# Patient Record
Sex: Male | Born: 1940 | Race: White | Hispanic: No | Smoking: Former smoker
Health system: Southern US, Community
[De-identification: ages and names within clinical notes are randomized; demographics above are authoritative.]

## PROBLEM LIST (undated history)

## (undated) DIAGNOSIS — R609 Edema, unspecified: Secondary | ICD-10-CM

## (undated) DIAGNOSIS — E119 Type 2 diabetes mellitus without complications: Secondary | ICD-10-CM

## (undated) DIAGNOSIS — I739 Peripheral vascular disease, unspecified: Secondary | ICD-10-CM

## (undated) DIAGNOSIS — Z95828 Presence of other vascular implants and grafts: Secondary | ICD-10-CM

## (undated) DIAGNOSIS — I509 Heart failure, unspecified: Secondary | ICD-10-CM

## (undated) DIAGNOSIS — I272 Pulmonary hypertension, unspecified: Secondary | ICD-10-CM

## (undated) DIAGNOSIS — I701 Atherosclerosis of renal artery: Secondary | ICD-10-CM

## (undated) DIAGNOSIS — I714 Abdominal aortic aneurysm, without rupture, unspecified: Secondary | ICD-10-CM

## (undated) DIAGNOSIS — I2510 Atherosclerotic heart disease of native coronary artery without angina pectoris: Secondary | ICD-10-CM

## (undated) DIAGNOSIS — W19XXXA Unspecified fall, initial encounter: Secondary | ICD-10-CM

## (undated) DIAGNOSIS — M542 Cervicalgia: Principal | ICD-10-CM

## (undated) DIAGNOSIS — R9430 Abnormal result of cardiovascular function study, unspecified: Secondary | ICD-10-CM

## (undated) DIAGNOSIS — M1990 Unspecified osteoarthritis, unspecified site: Secondary | ICD-10-CM

## (undated) DIAGNOSIS — I4891 Unspecified atrial fibrillation: Secondary | ICD-10-CM

## (undated) DIAGNOSIS — R011 Cardiac murmur, unspecified: Secondary | ICD-10-CM

## (undated) DIAGNOSIS — I10 Essential (primary) hypertension: Secondary | ICD-10-CM

## (undated) DIAGNOSIS — I779 Disorder of arteries and arterioles, unspecified: Secondary | ICD-10-CM

## (undated) DIAGNOSIS — K219 Gastro-esophageal reflux disease without esophagitis: Secondary | ICD-10-CM

## (undated) DIAGNOSIS — C801 Malignant (primary) neoplasm, unspecified: Secondary | ICD-10-CM

## (undated) DIAGNOSIS — J189 Pneumonia, unspecified organism: Secondary | ICD-10-CM

## (undated) DIAGNOSIS — R001 Bradycardia, unspecified: Secondary | ICD-10-CM

## (undated) DIAGNOSIS — E785 Hyperlipidemia, unspecified: Secondary | ICD-10-CM

## (undated) DIAGNOSIS — Z9981 Dependence on supplemental oxygen: Secondary | ICD-10-CM

## (undated) DIAGNOSIS — R42 Dizziness and giddiness: Secondary | ICD-10-CM

## (undated) DIAGNOSIS — I219 Acute myocardial infarction, unspecified: Secondary | ICD-10-CM

## (undated) DIAGNOSIS — Z951 Presence of aortocoronary bypass graft: Secondary | ICD-10-CM

## (undated) HISTORY — DX: Pneumonia, unspecified organism: J18.9

## (undated) HISTORY — DX: Hyperlipidemia, unspecified: E78.5

## (undated) HISTORY — DX: Atherosclerotic heart disease of native coronary artery without angina pectoris: I25.10

## (undated) HISTORY — PX: WRIST SURGERY: SHX841

## (undated) HISTORY — DX: Malignant (primary) neoplasm, unspecified: C80.1

## (undated) HISTORY — DX: Abdominal aortic aneurysm, without rupture, unspecified: I71.40

## (undated) HISTORY — PX: VASCULAR SURGERY: SHX849

## (undated) HISTORY — DX: Presence of other vascular implants and grafts: Z95.828

## (undated) HISTORY — DX: Dizziness and giddiness: R42

## (undated) HISTORY — DX: Peripheral vascular disease, unspecified: I73.9

## (undated) HISTORY — DX: Acute myocardial infarction, unspecified: I21.9

## (undated) HISTORY — DX: Essential (primary) hypertension: I10

## (undated) HISTORY — DX: Edema, unspecified: R60.9

## (undated) HISTORY — DX: Disorder of arteries and arterioles, unspecified: I77.9

## (undated) HISTORY — DX: Abnormal result of cardiovascular function study, unspecified: R94.30

## (undated) HISTORY — DX: Type 2 diabetes mellitus without complications: E11.9

## (undated) HISTORY — DX: Presence of aortocoronary bypass graft: Z95.1

## (undated) HISTORY — DX: Atherosclerosis of renal artery: I70.1

## (undated) HISTORY — DX: Bradycardia, unspecified: R00.1

## (undated) HISTORY — DX: Abdominal aortic aneurysm, without rupture: I71.4

## (undated) HISTORY — DX: Pulmonary hypertension, unspecified: I27.20

## (undated) HISTORY — DX: Cervicalgia: M54.2

## (undated) HISTORY — DX: Unspecified osteoarthritis, unspecified site: M19.90

---

## 1997-04-02 DIAGNOSIS — I219 Acute myocardial infarction, unspecified: Secondary | ICD-10-CM

## 1997-04-02 HISTORY — DX: Acute myocardial infarction, unspecified: I21.9

## 1998-04-02 DIAGNOSIS — Z951 Presence of aortocoronary bypass graft: Secondary | ICD-10-CM

## 1998-04-02 HISTORY — DX: Presence of aortocoronary bypass graft: Z95.1

## 1998-04-02 HISTORY — PX: CORONARY ARTERY BYPASS GRAFT: SHX141

## 1998-11-01 ENCOUNTER — Inpatient Hospital Stay (HOSPITAL_COMMUNITY): Admission: AD | Admit: 1998-11-01 | Discharge: 1998-11-09 | Payer: Self-pay | Admitting: Cardiology

## 1998-11-04 ENCOUNTER — Encounter: Payer: Self-pay | Admitting: Thoracic Surgery (Cardiothoracic Vascular Surgery)

## 1998-11-05 ENCOUNTER — Encounter: Payer: Self-pay | Admitting: Thoracic Surgery (Cardiothoracic Vascular Surgery)

## 1999-01-12 ENCOUNTER — Ambulatory Visit: Admission: RE | Admit: 1999-01-12 | Discharge: 1999-01-12 | Payer: Self-pay | Admitting: Vascular Surgery

## 1999-02-07 ENCOUNTER — Encounter: Payer: Self-pay | Admitting: Vascular Surgery

## 1999-02-08 ENCOUNTER — Encounter: Payer: Self-pay | Admitting: Vascular Surgery

## 1999-02-08 ENCOUNTER — Inpatient Hospital Stay: Admission: RE | Admit: 1999-02-08 | Discharge: 1999-02-13 | Payer: Self-pay | Admitting: Vascular Surgery

## 1999-05-03 ENCOUNTER — Inpatient Hospital Stay: Admission: RE | Admit: 1999-05-03 | Discharge: 1999-05-06 | Payer: Self-pay | Admitting: Vascular Surgery

## 1999-05-04 ENCOUNTER — Encounter: Payer: Self-pay | Admitting: Vascular Surgery

## 1999-07-19 ENCOUNTER — Inpatient Hospital Stay (HOSPITAL_COMMUNITY): Admission: RE | Admit: 1999-07-19 | Discharge: 1999-07-25 | Payer: Self-pay | Admitting: Vascular Surgery

## 1999-07-19 ENCOUNTER — Encounter: Payer: Self-pay | Admitting: Vascular Surgery

## 1999-07-19 HISTORY — PX: PR VEIN BYPASS GRAFT,AORTO-FEM-POP: 35551

## 2002-11-30 ENCOUNTER — Ambulatory Visit (HOSPITAL_COMMUNITY): Admission: RE | Admit: 2002-11-30 | Discharge: 2002-11-30 | Payer: Self-pay | Admitting: Urology

## 2002-11-30 ENCOUNTER — Encounter: Payer: Self-pay | Admitting: Urology

## 2002-12-03 ENCOUNTER — Ambulatory Visit: Admission: RE | Admit: 2002-12-03 | Discharge: 2003-03-03 | Payer: Self-pay | Admitting: Radiation Oncology

## 2003-03-04 ENCOUNTER — Ambulatory Visit: Admission: RE | Admit: 2003-03-04 | Discharge: 2003-05-13 | Payer: Self-pay

## 2003-03-08 ENCOUNTER — Encounter: Admission: RE | Admit: 2003-03-08 | Discharge: 2003-03-08 | Payer: Self-pay | Admitting: Urology

## 2003-04-06 ENCOUNTER — Ambulatory Visit (HOSPITAL_COMMUNITY): Admission: RE | Admit: 2003-04-06 | Discharge: 2003-04-06 | Payer: Self-pay | Admitting: Urology

## 2003-04-06 ENCOUNTER — Ambulatory Visit (HOSPITAL_BASED_OUTPATIENT_CLINIC_OR_DEPARTMENT_OTHER): Admission: RE | Admit: 2003-04-06 | Discharge: 2003-04-06 | Payer: Self-pay | Admitting: Urology

## 2004-05-02 ENCOUNTER — Ambulatory Visit: Payer: Self-pay | Admitting: Cardiology

## 2005-04-30 ENCOUNTER — Ambulatory Visit: Payer: Self-pay | Admitting: Cardiology

## 2005-08-24 ENCOUNTER — Ambulatory Visit (HOSPITAL_COMMUNITY): Admission: RE | Admit: 2005-08-24 | Discharge: 2005-08-24 | Payer: Self-pay | Admitting: Family Medicine

## 2005-10-18 ENCOUNTER — Ambulatory Visit (HOSPITAL_COMMUNITY): Admission: RE | Admit: 2005-10-18 | Discharge: 2005-10-18 | Payer: Self-pay | Admitting: Family Medicine

## 2006-04-08 ENCOUNTER — Ambulatory Visit: Payer: Self-pay | Admitting: Cardiology

## 2006-04-30 ENCOUNTER — Ambulatory Visit: Payer: Self-pay

## 2006-05-02 HISTORY — PX: PR VEIN BYPASS GRAFT,AORTO-FEM-POP: 35551

## 2006-07-15 ENCOUNTER — Ambulatory Visit: Payer: Self-pay | Admitting: Vascular Surgery

## 2006-10-10 ENCOUNTER — Ambulatory Visit: Payer: Self-pay | Admitting: Cardiology

## 2006-10-10 LAB — CONVERTED CEMR LAB
ALT: 20 units/L (ref 0–53)
AST: 26 units/L (ref 0–37)
Albumin: 3.7 g/dL (ref 3.5–5.2)
Alkaline Phosphatase: 66 units/L (ref 39–117)
Bilirubin, Direct: 0.1 mg/dL (ref 0.0–0.3)
LDL Cholesterol: 68 mg/dL (ref 0–99)
VLDL: 37 mg/dL (ref 0–40)

## 2007-03-06 ENCOUNTER — Ambulatory Visit: Payer: Self-pay | Admitting: Cardiology

## 2007-03-06 LAB — CONVERTED CEMR LAB
Albumin: 3.4 g/dL — ABNORMAL LOW (ref 3.5–5.2)
LDL Cholesterol: 78 mg/dL (ref 0–99)
Total Bilirubin: 0.7 mg/dL (ref 0.3–1.2)
Total CHOL/HDL Ratio: 7.4
Triglycerides: 185 mg/dL — ABNORMAL HIGH (ref 0–149)

## 2007-03-07 ENCOUNTER — Ambulatory Visit: Payer: Self-pay | Admitting: Vascular Surgery

## 2007-07-07 ENCOUNTER — Ambulatory Visit: Payer: Self-pay | Admitting: Cardiology

## 2007-09-09 ENCOUNTER — Ambulatory Visit: Payer: Self-pay | Admitting: Cardiology

## 2007-09-09 LAB — CONVERTED CEMR LAB
AST: 31 units/L (ref 0–37)
Albumin: 3.7 g/dL (ref 3.5–5.2)
Alkaline Phosphatase: 55 units/L (ref 39–117)
Cholesterol: 115 mg/dL (ref 0–200)
HDL: 19.9 mg/dL — ABNORMAL LOW (ref >39.0)
LDL Cholesterol: 65 mg/dL (ref 0–99)
Total Protein: 6.7 g/dL (ref 6.0–8.3)
Triglycerides: 149 mg/dL (ref 0–149)

## 2008-01-05 ENCOUNTER — Ambulatory Visit: Payer: Self-pay | Admitting: Cardiology

## 2008-03-23 ENCOUNTER — Ambulatory Visit: Payer: Self-pay | Admitting: Vascular Surgery

## 2008-07-04 DIAGNOSIS — R609 Edema, unspecified: Secondary | ICD-10-CM | POA: Insufficient documentation

## 2008-07-04 DIAGNOSIS — Z951 Presence of aortocoronary bypass graft: Secondary | ICD-10-CM | POA: Insufficient documentation

## 2008-07-05 ENCOUNTER — Encounter: Payer: Self-pay | Admitting: Cardiology

## 2008-07-05 ENCOUNTER — Ambulatory Visit: Payer: Self-pay | Admitting: Cardiology

## 2008-10-14 ENCOUNTER — Ambulatory Visit (HOSPITAL_COMMUNITY): Admission: RE | Admit: 2008-10-14 | Discharge: 2008-10-14 | Payer: Self-pay | Admitting: Family Medicine

## 2009-03-03 ENCOUNTER — Encounter: Payer: Self-pay | Admitting: Cardiology

## 2009-03-11 ENCOUNTER — Telehealth (INDEPENDENT_AMBULATORY_CARE_PROVIDER_SITE_OTHER): Payer: Self-pay | Admitting: *Deleted

## 2009-03-23 ENCOUNTER — Encounter: Payer: Self-pay | Admitting: Cardiology

## 2009-04-15 ENCOUNTER — Ambulatory Visit: Payer: Self-pay | Admitting: Vascular Surgery

## 2009-04-18 ENCOUNTER — Ambulatory Visit (HOSPITAL_COMMUNITY): Admission: RE | Admit: 2009-04-18 | Discharge: 2009-04-18 | Payer: Self-pay | Admitting: Family Medicine

## 2009-05-25 ENCOUNTER — Encounter (INDEPENDENT_AMBULATORY_CARE_PROVIDER_SITE_OTHER): Payer: Self-pay | Admitting: *Deleted

## 2009-07-12 ENCOUNTER — Encounter: Payer: Self-pay | Admitting: Cardiology

## 2009-07-13 ENCOUNTER — Ambulatory Visit: Payer: Self-pay | Admitting: Cardiology

## 2009-11-09 ENCOUNTER — Encounter: Payer: Self-pay | Admitting: Internal Medicine

## 2009-11-30 ENCOUNTER — Ambulatory Visit (HOSPITAL_COMMUNITY): Admission: RE | Admit: 2009-11-30 | Discharge: 2009-11-30 | Payer: Self-pay | Admitting: Internal Medicine

## 2009-11-30 ENCOUNTER — Ambulatory Visit: Payer: Self-pay | Admitting: Internal Medicine

## 2009-11-30 HISTORY — PX: COLONOSCOPY: SHX174

## 2010-01-04 ENCOUNTER — Ambulatory Visit: Payer: Self-pay | Admitting: Cardiology

## 2010-01-04 DIAGNOSIS — E785 Hyperlipidemia, unspecified: Secondary | ICD-10-CM | POA: Insufficient documentation

## 2010-05-04 NOTE — Letter (Signed)
Summary: Internal Other Domingo Dimes  Internal Other Domingo Dimes   Imported By: Cloria Spring LPN 16/01/9603 54:09:81  _____________________________________________________________________  External Attachment:    Type:   Image     Comment:   External Document

## 2010-05-04 NOTE — Letter (Signed)
Summary: Appointment - Reminder 2  [employer], Main Office  [address]   [place], [place] [address]   Phone: [phone]  Fax: [phone]     May 25, 2009 MRN: [id number]   Troy [name] [address] [place], [place]  [address]   Dear [name],  Our records indicate that it is time to schedule a follow-up appointment with Dr. Myrtis Ser. It is very important that we reach you to schedule this appointment. We look forward to participating in your health care needs. Please contact us at the number listed above at your earliest convenience to schedule your appointment.  If you are unable to make an appointment at this time, give [place] a call so we can update our records.     Sincerely,   Migdalia Dk [facility] Scheduling Team

## 2010-05-04 NOTE — Letter (Signed)
Summary: [employer] - Disability  [employer] - Disability   Imported By: Marylou Mccoy 05/18/2009 16:23:17  _____________________________________________________________________  External Attachment:    Type:   Image     Comment:   External Document

## 2010-05-04 NOTE — Assessment & Plan Note (Signed)
Summary: f45m   Visit Type:  Follow-up Primary Dracen Reigle:  Phillips Odor  CC:  CAD.  History of Present Illness: The patient is seen for followup of coronary artery disease.  He is stable.  He's not having any chest pain.  He has no significant shortness of breath.  Current Medications (verified): 1)  Aspirin 325 Mg Tabs (Aspirin) .... Take 1 Tab By Mouth Occasionally 2)  Coumadin 2 Mg Tabs (Warfarin Sodium) .... Uad 3)  Furosemide 40 Mg Tabs (Furosemide) .... Take One Tablet By Mouth Daily. 4)  Simvastatin 80 Mg Tabs (Simvastatin) .... 1/2 At Bedtime 5)  Cozaar 100 Mg Tabs (Losartan Potassium) .[name].. 1 Once Daily 6)  Metoprolol Tartrate 50 Mg Tabs (Metoprolol Tartrate) .... 1/2 Tab By Mouth Twice A Day 7)  Isosorbide Dinitrate 20 Mg Tabs (Isosorbide Dinitrate) .... Take One Capsule By Mouth Two Times A Day 8)  Amlodipine Besylate 10 Mg Tabs (Amlodipine Besylate) .... Take One Tablet By Mouth Daily 9)  Metformin Hcl 1000 Mg Tabs (Metformin Hcl) .... Two Times A Day 10)  Tylenol .... Prn 11)  Fish Oil   Oil (Fish Oil) .... Once Daily 12)  Cozaar 100 Mg Tabs (Losartan Potassium) .[name].. 1 Once Daily 13)  Loratadine 10 Mg Tabs (Loratadine) .... As Needed 14)  Hydrocodone-Acetaminophen 5-500 Mg Tabs (Hydrocodone-Acetaminophen) .... As Needed  Allergies (verified): 1)  ! Potassium Chloride Cr (Potassium Chloride) 2)  ! 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 /   CABG  /   nuclear  04/2006...normal Fem-Pop  surgery   Dr. Arbie Cookey LV   normal nuclear   (no echo data as of 07/12/2009) Sinus bradycardia Carotid arteries.... Dopplers [facility] by Dr. Nobie Putnam Dyslipidemia  Review of Systems       Patient denies fever,  chills, headache, sweats, rash, change in vision, change in hearing, chest pain, cough, nausea vomiting, urinary symptoms.  All other systems are reviewed and are negative.  Vital Signs:  Patient profile:   70 year old male Height:      71 inches Weight:      243 pounds BMI:     34.01 Pulse rate:   60 / minute BP sitting:   112 / 58  (left arm) Cuff size:   regular  Vitals Entered By: Hardin Negus, RMA (January 04, 2010 12:14 PM)  Physical Exam  General:  patient is stable but overweight. Eyes:  no xanthelasma. Neck:  no jugular venous distention. Lungs:  lungs are clear.  Respiratory effort is nonlabored. Heart:  cardiac exam reveals S1-S2.  No clicks or significant murmurs. Abdomen:  abdomen is soft. Extremities:  no peripheral edema. Psych:  patient is oriented to person time and place.  affect is normal.   Impression & Recommendations:  Problem # 1:  SINUS BRADYCARDIA (ICD-427.81) Heart rate today is 52.  He is stable with this.  No change.  Problem # 2:  ESSENTIAL HYPERTENSION, BENIGN (ICD-401.1)  His updated medication list for this problem includes:    Aspirin 325 Mg Tabs (Aspirin) .[name]... Take 1 tab by mouth occasionally    Furosemide 40 Mg Tabs (Furosemide) .[name]... Take one tablet by mouth daily.    Cozaar 100 Mg Tabs (Losartan potassium) .[name]... 1 once daily    Metoprolol Tartrate  50 Mg Tabs (Metoprolol tartrate) .[name]... 1/2 tab by mouth twice a day    Amlodipine Besylate 10 Mg Tabs (Amlodipine besylate) .[name]... Take one tablet by mouth daily Blood pressure control.  No change in therapy.  Problem # 3:  CHEST PAIN, PRECORDIAL (ICD-786.51)  The following medications were removed from the medication list:    Nitroglycerin 0.4 Mg Subl (Nitroglycerin) .[name]... Take 1 tab sl as needed chest pain His updated medication list for this problem includes:    Aspirin 325 Mg Tabs (Aspirin) .[name]... Take 1 tab by mouth occasionally    Coumadin 2 Mg Tabs (Warfarin sodium) ..... Uad     Metoprolol Tartrate 50 Mg Tabs (Metoprolol tartrate) .[name]... 1/2 tab by mouth twice a day    Isosorbide Dinitrate 20 Mg Tabs (Isosorbide dinitrate) .[name]... Take one capsule by mouth two times a day    Amlodipine Besylate 10 Mg Tabs (Amlodipine besylate) .[name]... Take one tablet by mouth daily Coronary disease is stable.  No change in therapy.  Problem # 4:  DYSLIPIDEMIA (ICD-272.4)  His updated medication list for this problem includes:    Simvastatin 80 Mg Tabs (Simvastatin) .[name]... 1/2 at bedtime The patient has been on 80 mg of simvastatin.  Current recommendations made need to decrease his dose to 20 mg daily.  If over time he needs more aggressive treatment, we can consider Lipitor when it becomes generic  Patient Instructions: 1)  Decrease Simvastatin to 1/2 tab daily, when you are about to run out of pills give me a call because we then want to decrease to 20mg . 2)  Your physician wants you to follow-up in:  6 months.  You will receive a reminder letter in the mail two months in advance. If you don't receive a letter, please call our office to schedule the follow-up appointment.

## 2010-05-04 NOTE — Miscellaneous (Signed)
  Clinical Lists Changes  Observations: Added new observation of PRIMARY MD: Patrica Duel, MD (07/12/2009 7:40)

## 2010-05-04 NOTE — Assessment & Plan Note (Signed)
Summary: f1y   Visit Type:  Follow-up Primary Provider:  Patrica Duel, MD  CC:  CAD.  History of Present Illness: The patient is seen for follow up of coronary disease.  He has chronic left anterior chest pain that is not ischemic.  He had a followup nuclear study showing no ischemia.  He has not had any unusual chest pain syncope or presyncope.  Current Medications (verified): 1)  Aspirin 325 Mg Tabs (Aspirin) .... Take 1 Tab By Mouth Every Day 2)  Coumadin 2 Mg Tabs (Warfarin Sodium) .... Uad 3)  Furosemide 40 Mg Tabs (Furosemide) .... Take One Tablet By Mouth Daily. 4)  Simvastatin 80 Mg Tabs (Simvastatin) .... 1/2 At Bedtime 5)  Cozaar 100 Mg Tabs (Losartan Potassium) .[name].. 1 Once Daily 6)  Metoprolol Tartrate 50 Mg Tabs (Metoprolol Tartrate) .... 1/2 Tab By Mouth Twice A Day 7)  Isosorbide Dinitrate 20 Mg Tabs (Isosorbide Dinitrate) .... Take One Capsule By Mouth Two Times A Day 8)  Amlodipine Besylate 10 Mg Tabs (Amlodipine Besylate) .... Take One Tablet By Mouth Daily 9)  Metformin Hcl 500 Mg Tabs (Metformin Hcl) .[name].. 1 Once Daily 10)  Nitroglycerin 0.4 Mg Subl (Nitroglycerin) .... Take 1 Tab Sl As Needed Chest Pain 11)  Tylenol .... Prn 12)  Fish Oil   Oil (Fish Oil) .... Once Daily 13)  Cozaar 100 Mg Tabs (Losartan Potassium) .[name].. 1 Once Daily  Allergies (verified): 1)  ! Potassium Chloride Cr (Potassium Chloride) 2)  ! 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 /   CABG  /   nuclear  04/2006...normal Fem-Pop  surgery   Dr. Arbie Cookey LV   normal nuclear   (no echo data as of 07/12/2009) Sinus bradycardia Carotid arteries.... Dopplers done at [facility] by Dr. Nobie Putnam  Review of Systems       Patient denies  fever, chills, headache, sweats, rash, change in vision, change in hearing, cough, nausea vomiting, urinary symptoms.  All other systems are reviewed and are negative  Vital Signs:  Patient profile:   70 year old male Height:      71 inches Weight:      247 pounds BMI:     34.57 Pulse rate:   50 / minute BP sitting:   122 / 68  (left arm) Cuff size:   regular  Vitals Entered By: Hardin Negus, RMA (July 13, 2009 10:57 AM)  Physical Exam  General:  patient is stable. Eyes:  no xanthelasma. Neck:  no jugular venous distention. Lungs:  lungs are clear.  Respiratory effort is nonlabored. Heart:  cardiac exam reveals S1-S2.  No clicks or significant murmurs. Abdomen:  abdomen is soft. Extremities:  no peripheral edema. Psych:  patient is oriented to person time and place.  Affect is normal.   Impression & Recommendations:  Problem # 1:  SINUS BRADYCARDIA (ICD-427.81)  His updated medication list for this problem includes:    Aspirin 325 Mg Tabs (Aspirin) .[name]... Take 1 tab by mouth every day    Coumadin 2 Mg Tabs (Warfarin sodium) ..... Uad    Metoprolol Tartrate 50 Mg Tabs (Metoprolol tartrate) .[name]... 1/2 tab by mouth twice a day    Isosorbide Dinitrate 20 Mg Tabs (Isosorbide dinitrate) .[name]... Take one capsule by mouth two times a  day    Amlodipine Besylate 10 Mg Tabs (Amlodipine besylate) .[name]... Take one tablet by mouth daily    Nitroglycerin 0.4 Mg Subl (Nitroglycerin) .[name]... Take 1 tab sl as needed chest pain EKG is done today and reviewed by me.  He has chronic sinus bradycardia.  He has incomplete right bundle branch block.  No change in therapy.  Problem # 2:  ESSENTIAL HYPERTENSION, BENIGN (ICD-401.1)  His updated medication list for this problem includes:    Aspirin 325 Mg Tabs (Aspirin) .[name]... Take 1 tab by mouth every day    Furosemide 40 Mg Tabs (Furosemide) .[name]... Take one tablet by mouth daily.    Cozaar 100 Mg Tabs (Losartan potassium) .[name]... 1 once daily    Metoprolol  Tartrate 50 Mg Tabs (Metoprolol tartrate) .[name]... 1/2 tab by mouth twice a day    Amlodipine Besylate 10 Mg Tabs (Amlodipine besylate) .[name]... Take one tablet by mouth daily Blood pressure is controlled.  No change in therapy.  Problem # 3:  CHEST PAIN, PRECORDIAL (ICD-786.51)  His updated medication list for this problem includes:    Aspirin 325 Mg Tabs (Aspirin) .[name]... Take 1 tab by mouth every day    Coumadin 2 Mg Tabs (Warfarin sodium) ..... Uad    Metoprolol Tartrate 50 Mg Tabs (Metoprolol tartrate) .[name]... 1/2 tab by mouth twice a day    Isosorbide Dinitrate 20 Mg Tabs (Isosorbide dinitrate) .[name]... Take one capsule by mouth two times a day    Amlodipine Besylate 10 Mg Tabs (Amlodipine besylate) .[name]... Take one tablet by mouth daily    Nitroglycerin 0.4 Mg Subl (Nitroglycerin) .[name]... Take 1 tab sl as needed chest pain The patient's coronary status is stable.  His chest pain is his chronic musculoskeletal pain.  No change in therapy.  Other Orders: EKG w/ Interpretation (93000)  Patient Instructions: 1)  Follow up in 6 months

## 2010-05-04 NOTE — Miscellaneous (Signed)
  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 /   CABG  /   nuclear  04/2006...normal Fem-Pop  surgery   Dr. 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 /   CABG  /   nuclear  04/2006...normal Fem-Pop  surgery   Dr. Arbie Cookey LV   normal nuclear   (no echo data as of 07/12/2009) Sinus bradycardia

## 2010-06-16 LAB — GLUCOSE, CAPILLARY: Glucose-Capillary: 149 mg/dL — ABNORMAL HIGH (ref 70–99)

## 2010-07-28 ENCOUNTER — Encounter: Payer: Self-pay | Admitting: *Deleted

## 2010-07-31 ENCOUNTER — Ambulatory Visit (INDEPENDENT_AMBULATORY_CARE_PROVIDER_SITE_OTHER): Payer: Medicare Other | Admitting: Cardiology

## 2010-07-31 ENCOUNTER — Encounter: Payer: Self-pay | Admitting: Cardiology

## 2010-07-31 ENCOUNTER — Other Ambulatory Visit: Payer: Self-pay | Admitting: Cardiology

## 2010-07-31 DIAGNOSIS — E785 Hyperlipidemia, unspecified: Secondary | ICD-10-CM

## 2010-07-31 DIAGNOSIS — R001 Bradycardia, unspecified: Secondary | ICD-10-CM | POA: Insufficient documentation

## 2010-07-31 DIAGNOSIS — Z95828 Presence of other vascular implants and grafts: Secondary | ICD-10-CM | POA: Insufficient documentation

## 2010-07-31 DIAGNOSIS — R9430 Abnormal result of cardiovascular function study, unspecified: Secondary | ICD-10-CM | POA: Insufficient documentation

## 2010-07-31 DIAGNOSIS — I2510 Atherosclerotic heart disease of native coronary artery without angina pectoris: Secondary | ICD-10-CM

## 2010-07-31 DIAGNOSIS — I701 Atherosclerosis of renal artery: Secondary | ICD-10-CM | POA: Insufficient documentation

## 2010-07-31 DIAGNOSIS — I779 Disorder of arteries and arterioles, unspecified: Secondary | ICD-10-CM

## 2010-07-31 DIAGNOSIS — R609 Edema, unspecified: Secondary | ICD-10-CM

## 2010-07-31 DIAGNOSIS — I498 Other specified cardiac arrhythmias: Secondary | ICD-10-CM

## 2010-07-31 MED ORDER — SIMVASTATIN 20 MG PO TABS: 20.0000 mg | ORAL_TABLET | Freq: Every day | ORAL | Status: DC

## 2010-07-31 NOTE — Assessment & Plan Note (Signed)
Coronary disease is stable.  The mild left anterior chest discomfort that he has is not ischemic in origin.  He had a nuclear scan 2008 showing no ischemia.  No further workup at this time.

## 2010-07-31 NOTE — Assessment & Plan Note (Signed)
We need to lower his simvastatin dose to 20 mg daily to the FDA recommendations as he is on amlodipine.  This will be changed to 20 mg daily.  He will follow up with his primary physician.  I would suggest followup lipid studies.  If he needs more aggressive treatment of his LDL, he could be switched to generic Lipitor.

## 2010-07-31 NOTE — Assessment & Plan Note (Signed)
He has a resting sinus bradycardia.  There is no symptoms.  No change in medicine.

## 2010-07-31 NOTE — Progress Notes (Signed)
HPI Patient is seen for followup of coronary disease.  He is doing well.  He has a history of CABG in the past.  Also over the years he has had some left lateral chest discomfort.  I have always felt that this was not his ischemic pain.  He continues to have this mild symptom.  It is nonexertional.  He is active.  He is not having chest pain or shortness of breath.  In addition he's not having any significant claudication.  His vascular surgery to the legs in the past was successful. Allergies  Allergen Reactions  . Niacin     Current Outpatient Prescriptions  Medication Sig Dispense Refill  . amLODipine (NORVASC) 10 MG tablet Take 10 mg by mouth daily.        [name] aspirin 325 MG EC tablet Take 325 mg by mouth daily.        . furosemide (LASIX) 40 MG tablet Take 40 mg by mouth daily.        [name] HYDROcodone-acetaminophen (VICODIN) 5-500 MG per tablet Take 1 tablet by mouth every 6 (six) hours as needed.        . isosorbide dinitrate (ISORDIL) 20 MG tablet Take 20 mg by mouth 2 (two) times daily.        [name] loratadine (CLARITIN) 10 MG tablet Take 10 mg by mouth as needed.        [name] losartan (COZAAR) 100 MG tablet Take 100 mg by mouth daily.        . metFORMIN (GLUCOPHAGE) 1000 MG tablet Take 1,000 mg by mouth 2 (two) times daily with a meal.        . metoprolol (LOPRESSOR) 50 MG tablet Take by mouth. Take 1/2 tablet twice a day       . Omega-3 Fatty Acids (FISH OIL) 1000 MG CAPS Take 1 capsule by mouth daily.        . simvastatin (ZOCOR) 80 MG tablet Take by mouth. Take 1/2 tablet at bed time       . warfarin (COUMADIN) 2 MG tablet Take 2 mg by mouth daily. As directed by Coumadin clinic         History   Social History  . Marital Status: Married    Spouse Name: N/A    Number of Children: N/A  . Years of Education: N/A   Occupational History  . Not on file.   Social History Main Topics  . Smoking status: Former Smoker    Quit date: 04/02/1998  . Smokeless tobacco: Not on file  . Alcohol  Use: No  . Drug Use: No  . Sexually Active: Not on file   Other Topics Concern  . Not on file   Social History Narrative  . No narrative on file    Family History  Problem Relation Age of Onset  . Coronary artery disease      family hx of    Past Medical History  Diagnosis Date  . Edema   . Hypertension   . Renal artery stenosis     50-70%  . Chest pain     precordial. mild chronic .[name]... nonischemic  . Coronary artery disease     Nuclear, January, 2008, no ischemia  . Sinus bradycardia   . Dyslipidemia   . Hx of CABG     2000  . S/P femoropopliteal bypass surgery     Dr. Arbie Cookey  . Ejection fraction     Normal LV, nuclear, 2008, ( no  echo data as July 31, 2010)  . Sinus bradycardia     Asymptomatic  . Carotid artery disease     Followed at [facility].    Past Surgical History  Procedure Date  . Coronary artery bypass graft 2000    ROS  Patient denies fever, chills, headache, sweats, rash, change in vision, change in hearing, cough, nausea vomiting, urinary symptoms.  All of the systems are reviewed and are negative.  PHYSICAL EXAM Patient is oriented to person time and place.  Affect is normal.  He is here with his wife today.  Head is atraumatic.  There is no xanthelasma.  There is a soft carotid bruit.  Lungs are clear.  Respiratory effort is unlabored.  Cardiac exam reveals S1-S2.  There is a 2/6 systolic murmur.  The abdomen is soft.  There is no peripheral edema.  There is no musculoskeletal deformities.  There are no significant skin rashes. Filed Vitals:   07/31/10 0955  BP: 135/68  Pulse: 52  Resp: 18    EKG EKG is done today and reviewed by me.  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  There is sinus bradycardia.  No significant change.  ASSESSMENT & PLAN

## 2010-07-31 NOTE — Assessment & Plan Note (Signed)
Historically the patient's carotids have been followed by Dopplers at [facility].  He has not had one in a prolonged period of time.  We will arrange for carotid Doppler.  I plan to see him back for his followup in 6 months.

## 2010-07-31 NOTE — Patient Instructions (Signed)
Change Simvastatin to 20mg  when you run out of what you have now. Your physician has requested that you have a carotid duplex. This test is an ultrasound of the carotid arteries in your neck. It looks at blood flow through these arteries that supply the brain with blood. Allow one hour for this exam. There are no restrictions or special instructions.  CAN HAVE DONE AT [facility]. Your physician wants you to follow-up in: 6 months. You will receive a reminder letter in the mail two months in advance. If you don't receive a letter, please call our office to schedule the follow-up appointment.

## 2010-07-31 NOTE — Assessment & Plan Note (Signed)
He does not have any edema at this time.  No change in therapy. 

## 2010-08-03 ENCOUNTER — Ambulatory Visit (HOSPITAL_COMMUNITY)
Admission: RE | Admit: 2010-08-03 | Discharge: 2010-08-03 | Disposition: A | Discharge status: Home / Self Care | Payer: Medicare Other | Source: Ambulatory Visit | Attending: Cardiology | Admitting: Cardiology

## 2010-08-03 DIAGNOSIS — I6529 Occlusion and stenosis of unspecified carotid artery: Secondary | ICD-10-CM | POA: Insufficient documentation

## 2010-08-03 DIAGNOSIS — I10 Essential (primary) hypertension: Secondary | ICD-10-CM | POA: Insufficient documentation

## 2010-08-03 DIAGNOSIS — Z951 Presence of aortocoronary bypass graft: Secondary | ICD-10-CM | POA: Insufficient documentation

## 2010-08-03 DIAGNOSIS — I2510 Atherosclerotic heart disease of native coronary artery without angina pectoris: Secondary | ICD-10-CM | POA: Insufficient documentation

## 2010-08-08 ENCOUNTER — Telehealth: Payer: Self-pay | Admitting: Cardiology

## 2010-08-08 NOTE — Telephone Encounter (Signed)
Spoke w/pharmacist, Isosorbide Dinetrate at 10 and 20mg  doses are unavailable, will need to change pt to something else, will discuss w/Dr Myrtis Ser and Kennon Rounds tom as to what would be best

## 2010-08-08 NOTE — Telephone Encounter (Signed)
They have pres on file for Isosorbide and they need to know what to replace with.  Please call number in Comments. Medication not in stock.  Please let them know you are calling from Dr. Myrtis Ser at beginning of call.

## 2010-08-09 ENCOUNTER — Telehealth: Payer: Self-pay | Admitting: Cardiology

## 2010-08-09 NOTE — Telephone Encounter (Signed)
Calling to get clarification on Isosorbide ref# [id number]

## 2010-08-09 NOTE — Telephone Encounter (Signed)
I called pharmacy regarding isosorbide they state they don't have it in stock right now they would like to switch a lower dose in Dinitrate or switch to monitrate 10-20 is unavailable will route to [facility] regarding dose change

## 2010-08-10 MED ORDER — ISOSORBIDE DINITRATE 40 MG PO TABS: ORAL_TABLET | ORAL | Status: DC

## 2010-08-10 NOTE — Telephone Encounter (Signed)
Done in phone note dated 5/8

## 2010-08-10 NOTE — Telephone Encounter (Signed)
Discussed w/Sally will change pt to 40mg  1/2 tab bid, [facility] is aware rx phoned in with no refills so they will contact to possible change pt back to regular dose when refill is needed, pt is aware

## 2010-08-15 NOTE — Assessment & Plan Note (Signed)
[facility]                            CARDIOLOGY OFFICE NOTE   NAME:Troy [name], Troy [name]                        MRN:          [id number]  DATE:01/05/2008                            DOB:          01/01/1941    CHIEF COMPLAINT:  Followup of coronary artery disease, renal artery  stenosis, hypertension, and peripheral edema.   1. Troy [name] is stable.  He underwent CABG in 2000.  He has mild      persistent left upper chest discomfort.  This is chronic and not      changed.  He does not appear to be having any significant angina.      He is active.  He is under great deal of stress working with his      wife, Troy [name], who has had cancer in an area around her spine.      Chemotherapy has been difficult.  He is managing the best he can      be.  2. The patient has 50-70% right renal artery stenosis, but his blood      pressure has been stable and he is being followed carefully and no      further tests are needed at this time.  3. History of hypertension.  This is being treated adequately.  4. History of peripheral edema.  He has mild edema in his right leg.      Otherwise, this is stable.   PAST MEDICAL HISTORY:   ALLERGIES:  He has had significant difficulties with Niaspan in the  past.  He has had some stomach discomfort from potassium.   MEDICATIONS:  Aspirin, Coumadin, furosemide, simvastatin, fish oil,  losartan, metoprolol, isosorbide, amlodipine, and metformin.   OTHER MEDICAL PROBLEMS:  See the complete list on the note of July 07, 2007.   REVIEW OF SYSTEMS:  He is not currently having any GI or GU symptoms.  Otherwise, his review of systems is negative.   PHYSICAL EXAMINATION:  VITAL SIGNS:  Weight is 244 pounds.  This is  stable for him.  Blood pressure is 130/80 with a pulse of 55.  GENERAL:  The patient is oriented to person, time, and place.  Affect is  normal.  HEENT:  No xanthelasma.  He has normal extraocular motion.  NECK:   There are no carotid bruits.  There is no jugular venous  distention.  LUNGS:  Clear.  Respiratory effort is not labored.  CARDIAC:  Reveals S1 with an S2.  There are no clicks or significant  murmurs.  ABDOMEN:  Protuberant, but soft.  He has trace peripheral edema in his  right lower extremity.   EKG reveals old incomplete right bundle-branch block.   Problems are listed completely on the note of July 07, 2007.  #1.  Coronary artery disease, stable.  He needs no testing at this time.  #2.  Renal artery stenosis.  His blood pressure is stable.  No further  tests.  #3.  Hypertension, stable.  He is on his current meds.  #6.  Peripheral edema.  This is stable with  no significant change.   His other problems are stable.  No other testing is needed.  I will see  him in 6 months.     Troy Abed, MD, [facility]  Electronically Signed    JDK/MedQ  DD: 01/05/2008  DT: 01/05/2008  Job #: [id number]   cc:   Troy [name], M.D.

## 2010-08-15 NOTE — Procedures (Signed)
BYPASS GRAFT EVALUATION   INDICATION:  Followup evaluation of lower extremity bypass grafts.   HISTORY:  Diabetes:  No  Cardiac:  CABG in 2000  Hypertension:  Yes.  Smoking:  Previous  Previous Surgery:  Right femoral pop bypass graft on 07/19/1999, left  femoral popliteal bypass graft on 04/12/2006.   SINGLE LEVEL ARTERIAL EXAM                               RIGHT              LEFT  Brachial:                    144                142  Anterior tibial:             167                164  Posterior tibial:            166                160  Peroneal:  Ankle/brachial index:        >1.0               >1.0   PREVIOUS ABI:  Date: 03/23/2008  RIGHT:  >1.0  LEFT:  >1.0   LOWER EXTREMITY BYPASS GRAFT DUPLEX EXAM:   DUPLEX:  Patent bilateral bifemoral popliteal bypass grafts with  biphasic waveforms proximally, within, and distal to the grafts.   IMPRESSION:  1. Stable ankle-brachial indices bilaterally.  2. Patent bifemoral popliteal bypass grafts with no focal stenosis      noted.        ___________________________________________  Larina Earthly, M.D.   CB/MEDQ  D:  04/15/2009  T:  04/15/2009  Job:  [id number]

## 2010-08-15 NOTE — Procedures (Signed)
DUPLEX ULTRASOUND OF ABDOMINAL AORTA   INDICATION:  Followup evaluation of known abdominal aortic aneurysm.   HISTORY:  Diabetes:  No.  Cardiac:  CABG 2000.  Hypertension:  Yes.  Smoking:  Quit 8 years ago.  Connective Tissue Disorder:  Family History:  Previous Surgery:   DUPLEX EXAM:         AP (cm)                   TRANSVERSE (cm)  Proximal             3.04 cm                   2.67 cm  Mid                  4.13 cm                   3.63 cm  Distal               3.57 cm                   4.05 cm  Right Iliac          1.16 cm                   1.16 cm  Left Iliac           1.20 cm                   1.65 cm   PREVIOUS:  Date:  AP:  3.9  TRANSVERSE:  3.4   IMPRESSION:  Abdominal aortic aneurysm with largest measurement of 4.13  cm x 3.63 cm which is fairly stable from previous study.   ___________________________________________  Larina Earthly, M.D.   CB/MEDQ  D:  04/15/2009  T:  04/15/2009  Job:  [id number]

## 2010-08-15 NOTE — Assessment & Plan Note (Signed)
[facility]                            CARDIOLOGY OFFICE NOTE   NAME:Gopaul, LLIAM HOH                        MRN:          [id number]  DATE:10/10/2006                            DOB:          05-19-1940    Mr. Penna is stable.  I saw him last in January 2008.  He has coronary  disease.  We did a Myoview scan because he has chronic chest discomfort.  Ejection fraction was good and there was no ischemia and he is doing  well.  He has his continued mild chronic discomfort which he lives with  and is not changed.  He is not having any major shortness of breath at  this time.   PAST MEDICAL HISTORY:   ALLERGIES:  The patient does not tolerate  1. NIASPAN or  2. POTASSIUM well.   MEDICATIONS:  Aspirin, Coumadin, furosemide, isosorbide, simvastatin,  fish oil, losartan, felodipine, and metoprolol.   OTHER MEDICAL PROBLEMS:  See te complete list on my note of April 08, 2006.   REVIEW OF SYSTEMS:  He has some mild swelling and mild chest discomfort  that is chronic and unchanged.   PHYSICAL EXAMINATION:  VITAL SIGNS:  Weight 243 pounds down 4 pounds  since his last visit.  Blood pressure 129/71 with a pulse of 50.  GENERAL:  The patient is oriented to person, time, and place.  Affect is  normal.  HEENT:  Reveals no xanthelasma. He has normal extraocular motion.  NECK:  There are no carotid bruits.  There is no jugular venous  distention.  CARDIAC:  Reveals an S1 with an S2.  He has no significant murmurs.  ABDOMEN:  Soft.  He has normal bowel sounds.  EXTREMITIES:  He has trace peripheral edema.   EKG shows sinus bradycardia but no marked abnormalities.   PROBLEMS:  Were listed 1-15 on my note of April 08, 2006.   His coronary status is stable.  His lipid status is being treated.  He is doing well overall.   He will have a followup fasting lipid profile today as he has not eaten.     Luis Abed, MD, [facility]  Electronically Signed    JDK/MedQ  DD: 10/10/2006  DT: 10/10/2006  Job #: [id number]   cc:   Patrica Duel, M.D.  Larina Earthly, M.D.

## 2010-08-15 NOTE — Procedures (Signed)
DUPLEX ULTRASOUND OF ABDOMINAL AORTA   INDICATION:  Follow-up evaluation of known abdominal aortic aneurysm.   HISTORY:  Diabetes:  No.  Cardiac:  Coronary artery bypass graft in 2000.  Hypertension:  Yes.  Smoking:  Quit 8  years ago.  Connective Tissue Disorder:  Family History:  Previous Surgery:   DUPLEX EXAM:         AP (cm)                   TRANSVERSE (cm)  Proximal             3.9 cm                    3.4 cm  Mid                  3.7 cm                    3.5 cm  Distal               3.5 cm                    3.7 cm  Right Iliac          1.5 cm                    1.8 cm  Left Iliac           1.7 cm                    1.7 cm   PREVIOUS:  Date:  AP:  4.1  TRANSVERSE:  3.9   IMPRESSION:  Abdominal aortic aneurysm measurements are stable compared  to previous study.   ___________________________________________  Larina Earthly, M.D.   MC/MEDQ  D:  03/23/2008  T:  03/23/2008  Job:  [id number]

## 2010-08-15 NOTE — Procedures (Signed)
BYPASS GRAFT EVALUATION   INDICATION:  Follow-up evaluation of lower extremity bypass graft.   HISTORY:  Diabetes:  No.  Cardiac:  Coronary artery bypass graft in 2000.  Hypertension:  Yes.  Smoking:  Quit 9 years ago.  Previous Surgery:  Right fem-pop bypass graft with Gore-Tex graft on  07/19/1999.  Left fem-pop bypass graft with translocated nonreversed  saphenous vein and left iliac endarterectomy with vein patch angioplasty  on 02/08/1999 by Dr. Arbie Cookey.   SINGLE LEVEL ARTERIAL EXAM                               RIGHT              LEFT  Brachial:                    150                148  Anterior tibial:             140                130  Posterior tibial:            160                156  Peroneal:  Ankle/brachial index:        >1.0               >1.0   PREVIOUS ABI:  Date: 03/07/2007  RIGHT:  >1.0  LEFT:  >1.0   LOWER EXTREMITY BYPASS GRAFT DUPLEX EXAM:   DUPLEX:  1. Doppler arterial waveforms are biphasic proximal to, within, and      distal to the fem-pop bypass graft.  2. On the left, Doppler arterial waveforms are biphasic proximal to,      within, and distal to the bypass graft.   IMPRESSION:  1. Ankle brachial indices are stable from previous study bilaterally.  2. Patent femoropopliteal bypass grafts bilaterally.   ___________________________________________  Larina Earthly, M.D.   MC/MEDQ  D:  03/23/2008  T:  03/23/2008  Job:  [id number]

## 2010-08-15 NOTE — Assessment & Plan Note (Signed)
[facility]                            CARDIOLOGY OFFICE NOTE   NAME:Troy [name], Troy [name]                        MRN:          [id number]  DATE:07/07/2007                            DOB:          Mar 26, 1941    Mr. Reczek is stable.  He is post CABG in 2000.  He has persistent mild  left upper chest discomfort which we have followed and we are not  worried about.  He does have edema.  If he stands up for a prolonged  period of time, he will have more.  Recently, he was helping with a  cookout, and he had some increased edema.  He knows he can take a  slightly increased Lasix dose if needed.  He is not having any chest  pain.  He is not having any major shortness of breath.   PAST MEDICAL HISTORY:   ALLERGIES:  1. He does not tolerate NIASPAN.  2. Sometimes he also has some stomach discomfort from POTASSIUM.[name]   MEDICATIONS:  1. Aspirin.  2. Coumadin.  3. Furosemide 40 b.i.d.  4. Simvastatin.  5. Fish oil.  6. Losartan.  7. Felodipine.  8. Metoprolol.  9. Isosorbide.   OTHER MEDICAL PROBLEMS:  See the list below.   REVIEW OF SYSTEMS:  He actually is doing well and has no major  complaints today, and his review of systems otherwise is negative.   PHYSICAL EXAMINATION:  VITAL SIGNS:  Blood pressure is 145/82, pulse is  85.  GENERAL:  The patient is oriented to person, time and place.  Affect is  normal.  HEENT:  No xanthelasma.  He has normal extraocular motion.  He has no  carotid bruits.  There is no jugular venous distention.  LUNGS:  Clear.  Respiratory effort is not labored.  CARDIAC:  An S1 with an S2.  There are no clicks or significant murmurs.  ABDOMEN:  Obese but soft.  EXTREMITIES:  He has 1 to 2+ peripheral edema today.  This is because of  recent standing for a prolonged period.  I have talked with him about  this, and he is being careful with his salt and fluid intake.  He may  take an extra Lasix dose or two, but he feels strongly  that this will  stabilize over the next day; if not, he will let us know.   PROBLEMS:  1. Coronary disease post CABG in 2000 with a follow-up Myoview done      for symptoms in January of 2008 showing no marked abnormalities.  2. 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  3. 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 Arbie Cookey.      His abdominal aortic aneurysm is also followed by Dr. Arbie Cookey.  4. Hypertension, treated.  5. Cigarettes, stopped in the past.  6. Peripheral edema.  See the discussion above.  No change in his      medications.  7. Chronic left upper chest pain, unchanged.  8. GERD.  9. Coumadin therapy.  10.Chronic venous insufficiency.  11.Hyperlipidemia on medicines.  12.History of creatinine of 1.6.  13.Difficulties with Altace in the past with a taste abnormality.  14.Cancer of the prostate with radiation.  15.Difficulties with Niaspan.   Overall, he is stable.  No change in his medications.     Luis Abed, MD, [facility]  Electronically Signed    JDK/MedQ  DD: 07/07/2007  DT: 07/07/2007  Job #: [id number]   cc:   Larina Earthly, M.D.  Patrica Duel, M.D.

## 2010-08-15 NOTE — Procedures (Signed)
BYPASS GRAFT EVALUATION   INDICATION:  Follow up bilateral lower extremity bypass grafts.   HISTORY:  Diabetes:  No.  Cardiac:  CABG in August, 2000.  Hypertension:  Yes.  Smoking:  No.  Previous Surgery:  Thrombectomy of right femoral to popliteal artery  bypass graft (Gore-Tex) on 07/19/1999; left femoral to popliteal artery  bypass graft with translocated nonreversed saphenous vein and left iliac  endarterectomy and vein patch angioplasty on February 08, 1999, all by  Dr. Arbie Cookey.   SINGLE LEVEL ARTERIAL EXAM                               RIGHT              LEFT  Brachial:                    120                114  Anterior tibial:             110                122  Posterior tibial:            122                120  Peroneal:  Ankle/brachial index:        >1.0               >1.0   PREVIOUS ABI:  Date: 03/12/2006  RIGHT:  >1.0  LEFT:  >1.0   LOWER EXTREMITY BYPASS GRAFT DUPLEX EXAM:   DUPLEX:  Doppler arterial waveforms are biphasic proximal to,  throughout, and distal to the grafts bilaterally.  Velocities are within  normal limits bilaterally.   IMPRESSION:  1. Patent bilateral femoral to popliteal artery bypass graft.  2. Ankle brachial indices are stable bilaterally.   ___________________________________________  Larina Earthly, M.D.   DP/MEDQ  D:  03/07/2007  T:  03/08/2007  Job:  [id number]

## 2010-08-18 NOTE — Assessment & Plan Note (Signed)
[facility]                            CARDIOLOGY OFFICE NOTE   NAME:Troy [name], Troy [name]                        MRN:          [id number]  DATE:04/08/2006                            DOB:          14-May-1940    Mr. Eoff is followed for his coronary disease.  He is post CABG in  2000.  He has had some residual left upper chest discomfort for several  years.  It appears to be somewhat worse at this time.  It is time to  proceed with an adenosine Myoview scan to reassess.  Also the patient  has peripheral edema.  It is position dependent.  If he stands for a  long time he has a lot of edema.  Otherwise, it is reasonably  controlled.  The patient has not had any syncope or presyncope.   PAST MEDICAL HISTORY:   ALLERGIES:  The patient does not tolerate Niaspan.   MEDICATIONS:  1. Lopressor 25 b.i.d.  2. Aspirin 325.  3. Coumadin 2 mg.  4. Furosemide 40 b.i.d.  5. Imdur 20 b.i.d.  6. Simvastatin 40.  7. Fish oil.  8. Losartan 100.  9. Amlodipine 10.  10.Loratadine 10.   OTHER MEDICAL PROBLEMS:  See the list below.   REVIEW OF SYSTEMS:  He is not having any significant GU symptoms.  He is  having some difficulty with swallowing.  It is possible that some of his  symptoms may be due to GERD or a stricture and this will need to be  considered over time.  He mentioned his peripheral edema as outlined  above.  Otherwise his review of systems is negative.   PHYSICAL EXAMINATION:  Weight today is 247 pounds.  This is down 3  pounds from last year.  Blood pressure is 127/74 with a pulse of 59.  The patient is oriented to person, time and place.  Affect is normal.  LUNGS:  Are clear.  Respiratory effort is not labored.  HEENT:  Reveals no xanthelasma.  He has normal extraocular motion.  There are no carotid bruits.  There is no jugular venous distention.  CARDIAC:  Reveals an S1 with an S2.  There are no clicks or significant  murmurs.  ABDOMEN:  Is soft  but obese.  He has no bruits.  He does have 1+ peripheral edema.   EKG reveals a sinus bradycardia with an incomplete right bundle branch  block but no acute change.   PROBLEMS:  1. Coronary disease post coronary artery bypass graft in 2000.  It is      time because he is having recurrent chest discomfort, to proceed      with an adenosine Myoview.  This will be arranged.  2. History of 50% to 70% right renal artery stenosis.  Blood pressure      is stable.  3. 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 Arbie Cookey and      the patient tells me that he has good flow in his legs.  His      abdominal aortic aneurysm also is followed  by Dr. Arbie Cookey.  4. Hypertension, treated.  5. Cigarettes, stopped in the past.  6. Peripheral edema.  Once, as always, he needs to be careful with his      salt and fluid intake.  Support hose would be helpful.  7. Left upper chest pain.  There is a chronic component to this but it      is somewhat worse and we will proceed with an adenosine Myoview.  8. Gastric esophageal reflux disease.  It is possible he might need an      endoscopy at some point.  9. Coumadin therapy.  10.Chronic venous insufficiency.  11.Hyperlipidemia and we do need a follow up fasting lipid profile.  12.History of creatinine 1.6.  13.Difficulties with Altace in the past related to taste      abnormalities.  14.Cancer of the prostate with radiation.  15.Difficulties with Niaspan.   We will proceed with a Myoview and checking his lipids and then be in  touch with him.     Luis Abed, MD, [facility]  Electronically Signed    JDK/MedQ  DD: 04/08/2006  DT: 04/08/2006  Job #: [id number]   cc:   Patrica Duel, M.D.  Larina Earthly, M.D.

## 2010-08-18 NOTE — Op Note (Signed)
NAME:  Troy [name], Troy [name] NO.:  [id number]   MEDICAL RECORD NO.:  [id number]                   PATIENT TYPE:  AMB   LOCATION:  [facility]                                 FACILITY:  [facility]   PHYSICIAN:  Ronald L. Ovidio Hanger, M.D.           DATE OF BIRTH:  Feb 03, 1941   DATE OF PROCEDURE:  04/06/2003  DATE OF DISCHARGE:                                 OPERATIVE REPORT   DIAGNOSES:  Adenocarcinoma of the prostate.   OPERATIVE PROCEDURE:  Transperineal implantation of INI-125 seeds into the  prostate and flexible cystourethroscopy.   SURGEON:  Lucrezia Starch. Earlene Plater, M.D.   ASSISTANT:  Wynn Banker, M.D.   ANESTHESIA:  General endotracheal.   ESTIMATED BLOOD LOSS:  15 cc.   DRAINS:  16-French Foley.   COMPLICATIONS:  None.   IMPLANTATION:  A total of 83 INI-125 seeds were implanted, with 23 needles  at 0.203 millicuries/seed.   INDICATIONS FOR PROCEDURE:  Troy [name] is a very nice 70 year old white  male, who presented with an elevated TSA of 4.98.  He underwent transrectal  ultrasound and biopsy of the prostate, which revealed a Glisson score 7  (which was 3+4 adenocarcinoma of the prostate from the right side of his  prostate).  He considered all options and elected to undergo a combination  of external beam and seed implantation, after a metastatic workup was  negative.  He has completed his external beam therapy.  He has been properly  simulated and properly informed and prepared for seed implantation.   PROCEDURE IN DETAIL:  The patient was placed in the supine position.  After  proper general endotracheal anesthesia, he was placed in the dorsal  lithotomy position; prepped and draped with Betadine in sterile fashion.  A  16-French Foley catheter was inserted in the bladder and the bladder was  drained.  The balloon was inflated with 10 cc of contrast solution.  Fluoroscopy was then placed into position, as was the transrectal ultrasound  probe on the  Stepper device.  Intraoperative planting was then performed,  utilizing the Burdett device and compared with prior dosimetry planting.  A  shift of needles 0.5 cm posteriorly and removal of one needle was performed  to smooth out the dosimetry curves.  We were very comfortable with them  intraoperatively and felt it was avoiding the urethra with good margins.   Two holding needles were then placed to each coordinates.  Proper  measurements were obtained.  It was localized fluoroscopy and utilizing both  the physical and electronic grid.  Serial implantation was then performed in  pre-planned coordinates.  Utilizing a total of 23 needles, 83 INI-125 seeds  were implanted at 0.203 millicuries/seed.  We were very comfortable  utilizing fluoroscopy and ultrasound for localization of the seeds.  Following seed implantation static images were obtained, and the prostate  was examined ultrasonically.  Again, we were very comfortable  with the  localization of the seeds.   The Stepper device was removed, as was the 16-French red rubber catheter  that had been placed in the rectum to prevent flatus.  The wound was dressed  sterilely.  The patient was placed in the supine position and the Foley  catheter was removed.  ___________ and there were none within it.   Flexible cystourethroscopy was then performed with an Olympus flexible  cystourethroscope.  He was noted to have grade 1 trabeculation, moderate  trilobar hypertrophy.  Efflux of clear urine was noted from the normally  placed ureteral orifices bilaterally, and there were no seeds or strands in  the bladder or in the urethra.  Then flexible cystourethroscope was visually  removed.  A new  16-French Foley catheter was inserted.  The bladder was drained.  The  patient was taken to the recovery room stable.                                               Ronald L. Ovidio Hanger, M.D.    RLD/MEDQ  D:  04/06/2003  T:  04/06/2003  Job:  [id number]

## 2010-10-17 ENCOUNTER — Other Ambulatory Visit: Payer: Self-pay | Admitting: Cardiology

## 2011-01-29 ENCOUNTER — Ambulatory Visit: Payer: Medicare Other | Admitting: Cardiology

## 2011-02-01 DIAGNOSIS — M542 Cervicalgia: Secondary | ICD-10-CM

## 2011-02-01 DIAGNOSIS — R42 Dizziness and giddiness: Secondary | ICD-10-CM

## 2011-02-01 HISTORY — DX: Dizziness and giddiness: R42

## 2011-02-01 HISTORY — DX: Cervicalgia: M54.2

## 2011-02-12 ENCOUNTER — Encounter: Payer: Self-pay | Admitting: Cardiology

## 2011-02-12 DIAGNOSIS — I779 Disorder of arteries and arterioles, unspecified: Secondary | ICD-10-CM | POA: Insufficient documentation

## 2011-02-12 DIAGNOSIS — I739 Peripheral vascular disease, unspecified: Secondary | ICD-10-CM

## 2011-02-14 ENCOUNTER — Ambulatory Visit (INDEPENDENT_AMBULATORY_CARE_PROVIDER_SITE_OTHER): Payer: Medicare Other | Admitting: Cardiology

## 2011-02-14 ENCOUNTER — Encounter: Payer: Self-pay | Admitting: Cardiology

## 2011-02-14 DIAGNOSIS — I701 Atherosclerosis of renal artery: Secondary | ICD-10-CM

## 2011-02-14 DIAGNOSIS — M542 Cervicalgia: Secondary | ICD-10-CM

## 2011-02-14 DIAGNOSIS — I779 Disorder of arteries and arterioles, unspecified: Secondary | ICD-10-CM

## 2011-02-14 DIAGNOSIS — R42 Dizziness and giddiness: Secondary | ICD-10-CM | POA: Insufficient documentation

## 2011-02-14 DIAGNOSIS — I2510 Atherosclerotic heart disease of native coronary artery without angina pectoris: Secondary | ICD-10-CM

## 2011-02-14 NOTE — Progress Notes (Signed)
HPI   Patient is seen today for follow up of coronary disease.  He has not had any significant chest pain.  He does mention that he has had some discomfort in his neck.  This appears to bother him sometimes with certain positions when he is lying in bed.  He's also had some mild dizziness.  He has not had any significant palpitations.  He is not had any syncope or presyncope.  Allergies  Allergen Reactions  . Niacin     Current Outpatient Prescriptions  Medication Sig Dispense Refill  . amLODipine (NORVASC) 10 MG tablet Take 10 mg by mouth daily.        [name] aspirin 325 MG EC tablet Take 325 mg by mouth daily.        . furosemide (LASIX) 40 MG tablet Take 40 mg by mouth daily.        [name] HYDROcodone-acetaminophen (VICODIN) 5-500 MG per tablet Take 1 tablet by mouth every 6 (six) hours as needed.        . isosorbide dinitrate (ISOCHRON) 40 MG CR tablet TAKE ONE-HALF (1/2) TABLET TWICE A DAY  90 tablet  3  . isosorbide dinitrate (ISORDIL) 40 MG tablet Take 1/2 tab Twice daily  90 tablet  0  . loratadine (CLARITIN) 10 MG tablet Take 10 mg by mouth as needed.        [name] losartan (COZAAR) 100 MG tablet Take 100 mg by mouth daily.        . metFORMIN (GLUCOPHAGE) 1000 MG tablet Take 1,000 mg by mouth 2 (two) times daily with a meal.        . metoprolol (LOPRESSOR) 50 MG tablet Take by mouth. Take 1/2 tablet twice a day       . Omega-3 Fatty Acids (FISH OIL) 1000 MG CAPS Take 1 capsule by mouth daily.        . simvastatin (ZOCOR) 20 MG tablet Take 1 tablet (20 mg total) by mouth at bedtime.  90 tablet  3  . warfarin (COUMADIN) 2 MG tablet Take 2 mg by mouth daily. As directed by Coumadin clinic         History   Social History  . Marital Status: Married    Spouse Name: N/A    Number of Children: N/A  . Years of Education: N/A   Occupational History  . Not on file.   Social History Main Topics  . Smoking status: Former Smoker    Quit date: 04/02/1998  . Smokeless tobacco: Not on file  .  Alcohol Use: No  . Drug Use: No  . Sexually Active: Not on file   Other Topics Concern  . Not on file   Social History Narrative  . No narrative on file    Family History  Problem Relation Age of Onset  . Coronary artery disease      family hx of    Past Medical History  Diagnosis Date  . Edema   . Hypertension   . Renal artery stenosis     50-70%  . Chest pain     precordial. mild chronic .[name]... nonischemic  . Coronary artery disease     Nuclear, January, 2008, no ischemia  . Sinus bradycardia   . Dyslipidemia   . Hx of CABG     2000  . S/P femoropopliteal bypass surgery     Dr. Arbie Cookey  . Ejection fraction     Normal LV, nuclear, 2008, ( no echo data as July 31, 2010)  . Sinus bradycardia     Asymptomatic  . Carotid artery disease     Doppler 08/10/2010,  no change, RICA 0-39%, LICA 50-69%  . Neck pain     November, 2012  . Dizziness     November, 2012    Past Surgical History  Procedure Date  . Coronary artery bypass graft 2000    ROS    Patient denies fever, chills, headache, sweats, rash, change in vision, change in hearing, chest pain, cough, nausea or vomiting, urinary symptoms.  All other systems are reviewed and are negative.  PHYSICAL EXAM   Patient is oriented to person time and place.  Affect is normal.  He is here with his wife.  Head is atraumatic.  There is no jugular venous distention.  No carotid bruits.  Lungs are clear.  Respiratory effort is nonlabored.  Cardiac exam reveals an S1-S2.  There are no clicks or significant murmur.  The abdomen is soft.  There is no peripheral edema.  There are no musculoskeletal deformities.  There no skin rashes.  Filed Vitals:   02/14/11 1052  BP: 132/75  Pulse: 51  Resp: 18  Height: 5\' 11"  (1.803 m)  Weight: 235 lb 12.8 oz (106.958 kg)    EKG is done today and reviewed by me.  There is sinus bradycardia. 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  There is no change.  There is some decrease in  anterior R wave that is old. ASSESSMENT & PLAN

## 2011-02-14 NOTE — Assessment & Plan Note (Signed)
We know that he has some renal artery stenosis.  His blood pressure is adequately controlled.

## 2011-02-14 NOTE — Assessment & Plan Note (Signed)
Blood pressure is stable.  However his heart rate is slow.  He is on a beta blocker.  This will be stopped.  He'll then see him for followup.  We may need to have him wear a Holter when he is off the beta blocker if he has more symptoms.

## 2011-02-14 NOTE — Assessment & Plan Note (Signed)
It is time for all of carotid Doppler.  This will be scheduled.

## 2011-02-14 NOTE — Assessment & Plan Note (Signed)
Coronary disease is stable.  I do not believe he is having any ischemia at this time.  His last stress study was in 2008.  I will consider a followup at a later date.

## 2011-02-14 NOTE — Assessment & Plan Note (Signed)
I think that his neck pain is most likely musculoskeletal.  No further cardiac workup for this.

## 2011-02-14 NOTE — Patient Instructions (Signed)
Your physician recommends that you schedule a follow-up appointment in: 3-4 weeks Your physician has requested that you have a carotid duplex. This test is an ultrasound of the carotid arteries in your neck. It looks at blood flow through these arteries that supply the brain with blood. Allow one hour for this exam. There are no restrictions or special instructions. This could be scheduled in [place] or [facility] Your physician has recommended you make the following change in your medication: DISCONTINUE Metoprolol

## 2011-02-23 ENCOUNTER — Other Ambulatory Visit (HOSPITAL_COMMUNITY): Payer: Self-pay | Admitting: Family Medicine

## 2011-02-23 ENCOUNTER — Ambulatory Visit (HOSPITAL_COMMUNITY)
Admission: RE | Admit: 2011-02-23 | Discharge: 2011-02-23 | Disposition: A | Discharge status: Home / Self Care | Payer: Medicare Other | Source: Ambulatory Visit | Attending: Family Medicine | Admitting: Family Medicine

## 2011-02-23 DIAGNOSIS — M25512 Pain in left shoulder: Secondary | ICD-10-CM

## 2011-02-23 DIAGNOSIS — M25519 Pain in unspecified shoulder: Secondary | ICD-10-CM | POA: Insufficient documentation

## 2011-02-28 ENCOUNTER — Other Ambulatory Visit: Payer: Self-pay | Admitting: Cardiology

## 2011-02-28 DIAGNOSIS — I6529 Occlusion and stenosis of unspecified carotid artery: Secondary | ICD-10-CM

## 2011-03-01 ENCOUNTER — Encounter (INDEPENDENT_AMBULATORY_CARE_PROVIDER_SITE_OTHER): Payer: Medicare Other | Admitting: *Deleted

## 2011-03-01 DIAGNOSIS — I6529 Occlusion and stenosis of unspecified carotid artery: Secondary | ICD-10-CM

## 2011-03-08 ENCOUNTER — Telehealth: Payer: Self-pay | Admitting: Cardiology

## 2011-03-08 NOTE — Telephone Encounter (Signed)
FU Call: Pt returning call to [facility]. Please call back.

## 2011-03-08 NOTE — Telephone Encounter (Signed)
Pt was notified of results

## 2011-03-16 ENCOUNTER — Ambulatory Visit: Payer: Medicare Other | Admitting: Cardiology

## 2011-05-02 ENCOUNTER — Encounter: Payer: Self-pay | Admitting: Cardiology

## 2011-05-04 ENCOUNTER — Encounter: Payer: Self-pay | Admitting: Cardiology

## 2011-05-04 ENCOUNTER — Ambulatory Visit (INDEPENDENT_AMBULATORY_CARE_PROVIDER_SITE_OTHER): Payer: Medicare Other | Admitting: Cardiology

## 2011-05-04 DIAGNOSIS — I779 Disorder of arteries and arterioles, unspecified: Secondary | ICD-10-CM

## 2011-05-04 DIAGNOSIS — I2510 Atherosclerotic heart disease of native coronary artery without angina pectoris: Secondary | ICD-10-CM

## 2011-05-04 DIAGNOSIS — R42 Dizziness and giddiness: Secondary | ICD-10-CM

## 2011-05-04 NOTE — Assessment & Plan Note (Signed)
Carotid artery disease is being watched carefully. He needs a followup study in one year.

## 2011-05-04 NOTE — Assessment & Plan Note (Signed)
Coronary disease is stable.  No further workup is needed. 

## 2011-05-04 NOTE — Assessment & Plan Note (Signed)
Dizziness is improved with the patient off Lopressor. No further workup.

## 2011-05-04 NOTE — Progress Notes (Signed)
HPI Patient is seen today in followup coronary disease and carotid artery disease and dizziness. I signed last February 14, 2011. He's doing well. When I saw him last we arrange for followup carotid Dopplers. These were done and showed 40-59% bilateral disease it was stable. This can be followed up in one year. Also when I saw him we took him off of his low dose of metoprolol. His dizziness is improved. His blood pressure is stable. He will not need further workup.  I discussed with him the situation with his daughter. She was in a automobile accident with severe injuries. She is recovering in a rehabilitation center. Also his wife who has significant cancer is quite stable.  Allergies  Allergen Reactions  . Niacin     Current Outpatient Prescriptions  Medication Sig Dispense Refill  . amLODipine (NORVASC) 10 MG tablet Take 10 mg by mouth daily.        [name] aspirin 325 MG EC tablet Take 325 mg by mouth daily.        . furosemide (LASIX) 40 MG tablet Take 40 mg by mouth daily.        [name] HYDROcodone-acetaminophen (VICODIN) 5-500 MG per tablet Take 1 tablet by mouth every 6 (six) hours as needed.        . isosorbide dinitrate (ISOCHRON) 40 MG CR tablet TAKE ONE-HALF (1/2) TABLET TWICE A DAY  90 tablet  3  . losartan (COZAAR) 100 MG tablet Take 100 mg by mouth daily.        . metFORMIN (GLUCOPHAGE) 1000 MG tablet Take 1,000 mg by mouth 2 (two) times daily with a meal.        . Omega-3 Fatty Acids (FISH OIL) 1000 MG CAPS Take 1 capsule by mouth daily.        . simvastatin (ZOCOR) 20 MG tablet Take 1 tablet (20 mg total) by mouth at bedtime.  90 tablet  3  . warfarin (COUMADIN) 2 MG tablet Take 2 mg by mouth daily. As directed by Coumadin clinic         History   Social History  . Marital Status: Married    Spouse Name: N/A    Number of Children: N/A  . Years of Education: N/A   Occupational History  . Not on file.   Social History Main Topics  . Smoking status: Former Smoker    Quit date:  04/02/1998  . Smokeless tobacco: Not on file  . Alcohol Use: No  . Drug Use: No  . Sexually Active: Not on file   Other Topics Concern  . Not on file   Social History Narrative  . No narrative on file    Family History  Problem Relation Age of Onset  . Coronary artery disease      family hx of    Past Medical History  Diagnosis Date  . Edema   . Hypertension   . Renal artery stenosis     50-70%  . Chest pain     precordial. mild chronic .[name]... nonischemic  . Coronary artery disease     Nuclear, January, 2008, no ischemia  . Sinus bradycardia   . Dyslipidemia   . Hx of CABG     2000  . S/P femoropopliteal bypass surgery     Dr. Arbie Cookey  . Ejection fraction     Normal LV, nuclear, 2008, ( no echo data as July 31, 2010)  . Sinus bradycardia     Asymptomatic  . Carotid  artery disease     Doppler 08/10/2010,  no change, RICA 0-39%, LICA 50-69%  . Neck pain     November, 2012  . Dizziness     November, 2012    Past Surgical History  Procedure Date  . Coronary artery bypass graft 2000    ROS  Patient denies fever, chills, headache, sweats, rash, change in vision, change in hearing, chest pain, , nausea vomiting, urinary symptoms. He recently had an upper respiratory infection that is improving. He has some mild residual cough. All other systems are reviewed and are negative.  PHYSICAL EXAM Patient is oriented to person time and place. Affect is normal. Head is atraumatic. There is no jugulovenous distention. Lungs reveal a few scattered rhonchi. Cardiac exam reveals S1 and S2. There no significant murmurs. There no skin rashes. Filed Vitals:   05/04/11 0907  BP: 132/70  Pulse: 60  Height: 5\' 11"  (1.803 m)  Weight: 223 lb (101.152 kg)   ASSESSMENT & PLAN

## 2011-05-04 NOTE — Patient Instructions (Signed)
Your physician wants you to follow-up in:  6 months. You will receive a reminder letter in the mail two months in advance. If you don't receive a letter, please call our office to schedule the follow-up appointment.   

## 2011-08-06 ENCOUNTER — Encounter: Payer: Self-pay | Admitting: Vascular Surgery

## 2011-08-07 ENCOUNTER — Encounter (INDEPENDENT_AMBULATORY_CARE_PROVIDER_SITE_OTHER): Payer: Medicare Other | Admitting: *Deleted

## 2011-08-07 ENCOUNTER — Ambulatory Visit (INDEPENDENT_AMBULATORY_CARE_PROVIDER_SITE_OTHER): Payer: Medicare Other | Admitting: *Deleted

## 2011-08-07 ENCOUNTER — Ambulatory Visit (INDEPENDENT_AMBULATORY_CARE_PROVIDER_SITE_OTHER): Payer: Medicare Other | Admitting: Vascular Surgery

## 2011-08-07 ENCOUNTER — Encounter: Payer: Self-pay | Admitting: Vascular Surgery

## 2011-08-07 VITALS — BP 149/74 | HR 57 | Temp 98.0°F | Ht 71.0 in | Wt 225.0 lb

## 2011-08-07 DIAGNOSIS — I739 Peripheral vascular disease, unspecified: Secondary | ICD-10-CM

## 2011-08-07 DIAGNOSIS — I714 Abdominal aortic aneurysm, without rupture, unspecified: Secondary | ICD-10-CM

## 2011-08-07 DIAGNOSIS — Z48812 Encounter for surgical aftercare following surgery on the circulatory system: Secondary | ICD-10-CM

## 2011-08-07 DIAGNOSIS — I7092 Chronic total occlusion of artery of the extremities: Secondary | ICD-10-CM

## 2011-08-07 NOTE — Progress Notes (Signed)
The patient presents today for continued followup of his lower extremity arterial insufficiency and known small abdominal aortic aneurysm. He is status post bilateral femoral-popliteal bypasses the right leg in 2001 in the left leg in 2008. He has had very durable result from both bypasses. He denies any claudication type symptoms. He also has a known small abdominal aortic aneurysm and is here today for followup of this with ultrasound. He has no symptoms referable to his aneurysm. He does report occasional chest tightness this is chronic and has no worse for him.  Past Medical History  Diagnosis Date  . Edema   . Hypertension   . Renal artery stenosis     50-70%  . Chest pain     precordial. mild chronic .[name]... nonischemic  . Coronary artery disease     Nuclear, January, 2008, no ischemia  . Sinus bradycardia   . Dyslipidemia   . Hx of CABG     2000  . S/P femoropopliteal bypass surgery     Dr. Arbie Cookey  . Ejection fraction     Normal LV, nuclear, 2008, ( no echo data as July 31, 2010)  . Sinus bradycardia     Asymptomatic  . Carotid artery disease     Doppler 08/10/2010,  no change, RICA 0-39%, LICA 50-69%  . Neck pain     November, 2012  . Dizziness     November, 2012  . Myocardial infarction 1999  . Cancer     colon    History  Substance Use Topics  . Smoking status: Former Smoker    Quit date: 04/02/1998  . Smokeless tobacco: Not on file  . Alcohol Use: 0.0 oz/week    0-2 drink(s) per week    Family History  Problem Relation Age of Onset  . Coronary artery disease      family hx of  . Deep vein thrombosis Father   . Cancer Sister     lung  . Diabetes Sister   . Heart disease Sister   . Hyperlipidemia Sister   . Hypertension Sister   . Cancer Brother     "crab cancer"  . Heart disease Brother   . Heart attack Brother   . Cancer Sister     lung  . Cancer Sister     breast    Allergies  Allergen Reactions  . Niacin     Current outpatient  prescriptions:amLODipine (NORVASC) 10 MG tablet, Take 10 mg by mouth daily.  , Disp: , Rfl: ;  aspirin 325 MG EC tablet, Take 325 mg by mouth daily.  , Disp: , Rfl: ;  furosemide (LASIX) 40 MG tablet, Take 40 mg by mouth daily.  , Disp: , Rfl: ;  HYDROcodone-acetaminophen (VICODIN) 5-500 MG per tablet, Take 1 tablet by mouth every 6 (six) hours as needed.  , Disp: , Rfl:  isosorbide dinitrate (ISOCHRON) 40 MG CR tablet, TAKE ONE-HALF (1/2) TABLET TWICE A DAY, Disp: 90 tablet, Rfl: 3;  losartan (COZAAR) 100 MG tablet, Take 100 mg by mouth daily.  , Disp: , Rfl: ;  metFORMIN (GLUCOPHAGE) 1000 MG tablet, Take 1,000 mg by mouth 2 (two) times daily with a meal.  , Disp: , Rfl: ;  Omega-3 Fatty Acids (FISH OIL) 1000 MG CAPS, Take 1 capsule by mouth daily.  , Disp: , Rfl:  simvastatin (ZOCOR) 20 MG tablet, Take 1 tablet (20 mg total) by mouth at bedtime., Disp: 90 tablet, Rfl: 3;  warfarin (COUMADIN) 2 MG tablet, Take  2 mg by mouth daily. As directed by Coumadin clinic , Disp: , Rfl:   BP 149/74  Pulse 57  Temp(Src) 98 F (36.7 C) (Oral)  Ht 5\' 11"  (1.803 m)  Wt 225 lb (102.059 kg)  BMI 31.38 kg/m2  Body mass index is 31.38 kg/(m^2).       Review of systems noted for chest pressure and leg swelling otherwise negative  Physical exam well-developed well-nourished white male appearing stated age in no acute distress HEENT is normal Carotid arteries without bruits bilaterally Pulse status: 2+ radial and 2+ femoral and 2+ dorsalis pedis pulses bilaterally Extremities without major deformity Chest clear bilaterally Heart regular rate and rhythm Abdomen obese nontender no masses noted no palpable aneurysm  Vascular laboratory studies reveal no change in his aneurysm size. His maximal size is 4.3 cm which is slightly larger than the study 2 years ago 4.2 cm. Lower extremity arterial or studies reveal normal triphasic waveforms and normal pressures bilaterally. His graft scans revealed no evidence of  stenosis.  Impression no change in his maximal aneurysm size. He will be seen again in one year with repeat ultrasound. I did review symptoms of leaking aneurysm with him again as to present immediately should this occur. Patent bilateral lower extremity bypasses with no evidence of stenosis. He will continue his walking program and see [place] again in one year for continued evaluation of this as well

## 2011-08-14 NOTE — Procedures (Unsigned)
BYPASS GRAFT EVALUATION  INDICATION:  Followup bilateral femoral to popliteal bypass grafts  HISTORY: Diabetes:  Yes Cardiac:  CABG Hypertension:  Yes Smoking:  Quit 2003 Previous Surgery:  Right femoral to popliteal bypass graft 07/19/1999, left femoral to popliteal bypass graft 04/12/2006  SINGLE LEVEL ARTERIAL EXAM                              RIGHT              LEFT Brachial: Anterior tibial: Posterior tibial: Peroneal: Ankle/brachial index:        1.24               1.11  PREVIOUS ABI:  Date:  04/15/2009  RIGHT:  >1.0  LEFT:  >1.0  LOWER EXTREMITY BYPASS GRAFT DUPLEX EXAM:  DUPLEX:  Widely patent right and left femoral to popliteal bypass grafts with triphasic waveforms noted throughout.  IMPRESSION: 1. Bilateral ankle brachial indices are within normal limits and     stable compared to the previous exam. 2. Patent bilateral femoral to popliteal bypass grafts with velocity     measurements shown on the following worksheet.  ___________________________________________ Larina Earthly, M.D.  EM/MEDQ  D:  08/07/2011  T:  08/07/2011  Job:  [id number]

## 2011-08-14 NOTE — Procedures (Unsigned)
DUPLEX ULTRASOUND OF ABDOMINAL AORTA  INDICATION:  Followup evaluation of known AAA  HISTORY: Diabetes:  Yes Cardiac:  CABG in 2000 Hypertension:  Yes Smoking:  Quit 2003 Connective Tissue Disorder: Family History:  No Previous Surgery:  No  DUPLEX EXAM:         AP (cm)                   TRANSVERSE (cm) Proximal             3.81 cm                   3.91 cm Mid                  4.30 cm                   cm Distal               3.74 cm                   3.70 cm Right Iliac          1.61 cm                   1.50 cm Left Iliac           Not visualized            Not visualized  PREVIOUS:  Date:  04/15/2009  AP:  4.13  TRANSVERSE:  3.63  IMPRESSION: 1. Abdominal aortic aneurysm noted with largest measurement of 4.30 cm     AP. 2. Appears stable in comparison to the previous exam although bowel     gas has limited visualization of some areas.  ___________________________________________ Larina Earthly, M.D.  EM/MEDQ  D:  08/07/2011  T:  08/07/2011  Job:  [id number]

## 2011-09-20 ENCOUNTER — Ambulatory Visit (HOSPITAL_COMMUNITY)
Admission: RE | Admit: 2011-09-20 | Discharge: 2011-09-20 | Disposition: A | Discharge status: Home / Self Care | Payer: Medicare Other | Source: Ambulatory Visit | Attending: Family Medicine | Admitting: Family Medicine

## 2011-09-20 ENCOUNTER — Other Ambulatory Visit (HOSPITAL_COMMUNITY): Payer: Self-pay | Admitting: Family Medicine

## 2011-09-20 DIAGNOSIS — M51379 Other intervertebral disc degeneration, lumbosacral region without mention of lumbar back pain or lower extremity pain: Secondary | ICD-10-CM | POA: Insufficient documentation

## 2011-09-20 DIAGNOSIS — M5137 Other intervertebral disc degeneration, lumbosacral region: Secondary | ICD-10-CM

## 2011-09-20 DIAGNOSIS — C61 Malignant neoplasm of prostate: Secondary | ICD-10-CM

## 2011-09-21 ENCOUNTER — Other Ambulatory Visit (HOSPITAL_COMMUNITY): Payer: Self-pay | Admitting: Family Medicine

## 2011-09-21 ENCOUNTER — Ambulatory Visit (HOSPITAL_COMMUNITY)
Admission: RE | Admit: 2011-09-21 | Discharge: 2011-09-21 | Disposition: A | Discharge status: Home / Self Care | Payer: Medicare Other | Source: Ambulatory Visit | Attending: Family Medicine | Admitting: Family Medicine

## 2011-09-21 DIAGNOSIS — C61 Malignant neoplasm of prostate: Secondary | ICD-10-CM | POA: Insufficient documentation

## 2011-09-21 DIAGNOSIS — M51379 Other intervertebral disc degeneration, lumbosacral region without mention of lumbar back pain or lower extremity pain: Secondary | ICD-10-CM | POA: Insufficient documentation

## 2011-09-21 DIAGNOSIS — M5137 Other intervertebral disc degeneration, lumbosacral region: Secondary | ICD-10-CM

## 2011-09-21 DIAGNOSIS — M5126 Other intervertebral disc displacement, lumbar region: Secondary | ICD-10-CM | POA: Insufficient documentation

## 2011-09-21 DIAGNOSIS — IMO0001 Reserved for inherently not codable concepts without codable children: Secondary | ICD-10-CM | POA: Insufficient documentation

## 2011-09-21 DIAGNOSIS — M79609 Pain in unspecified limb: Secondary | ICD-10-CM | POA: Insufficient documentation

## 2011-09-21 DIAGNOSIS — M25559 Pain in unspecified hip: Secondary | ICD-10-CM | POA: Insufficient documentation

## 2011-09-21 DIAGNOSIS — M47817 Spondylosis without myelopathy or radiculopathy, lumbosacral region: Secondary | ICD-10-CM | POA: Insufficient documentation

## 2011-09-21 MED ORDER — GADOBENATE DIMEGLUMINE 529 MG/ML IV SOLN
20.0000 mL | Freq: Once | INTRAVENOUS | Status: AC | PRN
  Administered 2011-09-21: 20 mL via INTRAVENOUS

## 2011-10-15 ENCOUNTER — Telehealth: Payer: Self-pay | Admitting: Cardiology

## 2011-10-15 NOTE — Telephone Encounter (Signed)
New msg Pt's wife wants to talk to you about his tests he has scheduled Please call

## 2011-10-15 NOTE — Telephone Encounter (Signed)
Patient is in system to have recall appointment this month but would like to get back on schedule with coming in with his wife.  She is in for recall in November.  Will forward to Dr Myrtis Ser and Ezekiel Slocumb. LPN to see if ok to wait until then to be seen.

## 2011-10-16 NOTE — Telephone Encounter (Signed)
It is okay to wait and have the patient come in with his wife at the time of her next visit

## 2011-10-17 ENCOUNTER — Ambulatory Visit: Payer: Medicare Other | Admitting: Cardiology

## 2011-10-17 ENCOUNTER — Other Ambulatory Visit: Payer: Self-pay | Admitting: *Deleted

## 2011-10-17 DIAGNOSIS — I714 Abdominal aortic aneurysm, without rupture: Secondary | ICD-10-CM

## 2011-10-18 NOTE — Telephone Encounter (Signed)
Pt called and appts scheduled.  He was notified of the date and time.

## 2011-10-22 ENCOUNTER — Telehealth: Payer: Self-pay | Admitting: *Deleted

## 2011-10-22 NOTE — Telephone Encounter (Signed)
I called the patient on 10-17-11 and discussed Dr. Bosie Helper note re: AAA size. Per TFE, no change in size, see in 6 months with Nurse Practioner.

## 2011-10-23 ENCOUNTER — Other Ambulatory Visit: Payer: Self-pay | Admitting: *Deleted

## 2011-10-23 MED ORDER — SIMVASTATIN 20 MG PO TABS: 20.0000 mg | ORAL_TABLET | Freq: Every day | ORAL | Status: DC

## 2011-10-23 MED ORDER — ISOSORBIDE DINITRATE ER 40 MG PO TBCR: EXTENDED_RELEASE_TABLET | ORAL | Status: DC

## 2012-02-04 ENCOUNTER — Ambulatory Visit (INDEPENDENT_AMBULATORY_CARE_PROVIDER_SITE_OTHER): Payer: Medicare Other | Admitting: Cardiology

## 2012-02-04 ENCOUNTER — Encounter: Payer: Self-pay | Admitting: Cardiology

## 2012-02-04 VITALS — BP 142/80 | HR 78 | Ht 70.0 in | Wt 229.4 lb

## 2012-02-04 DIAGNOSIS — I2510 Atherosclerotic heart disease of native coronary artery without angina pectoris: Secondary | ICD-10-CM

## 2012-02-04 DIAGNOSIS — E785 Hyperlipidemia, unspecified: Secondary | ICD-10-CM

## 2012-02-04 DIAGNOSIS — I779 Disorder of arteries and arterioles, unspecified: Secondary | ICD-10-CM

## 2012-02-04 NOTE — Assessment & Plan Note (Signed)
Coronary disease is stable. No change in therapy. 

## 2012-02-04 NOTE — Progress Notes (Signed)
HPI   Patient is seen to followup coronary disease. He is stable. He has chronic right chest pain. We feel it is not cardiac in origin. His overall coronary status is stable. She's not having any significant shortness of breath. Allergies  Allergen Reactions  . Niacin     Current Outpatient Prescriptions  Medication Sig Dispense Refill  . amLODipine (NORVASC) 10 MG tablet Take 10 mg by mouth daily.        [name] aspirin 325 MG EC tablet Take 325 mg by mouth daily.        . furosemide (LASIX) 40 MG tablet Take 40 mg by mouth daily.        [name] HYDROcodone-acetaminophen (VICODIN) 5-500 MG per tablet Take 1 tablet by mouth every 6 (six) hours as needed.        . isosorbide dinitrate (ISOCHRON) 40 MG CR tablet Take one-half (1/2) tablet twice a day  90 tablet  3  . losartan (COZAAR) 100 MG tablet Take 100 mg by mouth daily.        . metFORMIN (GLUCOPHAGE) 1000 MG tablet Take 1,000 mg by mouth 2 (two) times daily with a meal.        . Omega-3 Fatty Acids (FISH OIL) 1000 MG CAPS Take 1 capsule by mouth daily.        . simvastatin (ZOCOR) 20 MG tablet Take 1 tablet (20 mg total) by mouth at bedtime.  90 tablet  3  . warfarin (COUMADIN) 2 MG tablet Take 2 mg by mouth daily. As directed by Coumadin clinic         History   Social History  . Marital Status: Married    Spouse Name: N/A    Number of Children: N/A  . Years of Education: N/A   Occupational History  . Not on file.   Social History Main Topics  . Smoking status: Former Smoker    Quit date: 04/02/1998  . Smokeless tobacco: Not on file  . Alcohol Use: 0.0 oz/week    0-2 drink(s) per week  . Drug Use: No  . Sexually Active: Not on file   Other Topics Concern  . Not on file   Social History Narrative  . No narrative on file    Family History  Problem Relation Age of Onset  . Coronary artery disease      family hx of  . Deep vein thrombosis Father   . Cancer Sister     lung  . Diabetes Sister   . Heart disease Sister     . Hyperlipidemia Sister   . Hypertension Sister   . Cancer Brother     "crab cancer"  . Heart disease Brother   . Heart attack Brother   . Cancer Sister     lung  . Cancer Sister     breast    Past Medical History  Diagnosis Date  . Edema   . Hypertension   . Renal artery stenosis     50-70%  . Chest pain     precordial. mild chronic .[name]... nonischemic  . Coronary artery disease     Nuclear, January, 2008, no ischemia  . Sinus bradycardia   . Dyslipidemia   . Hx of CABG     2000  . S/P femoropopliteal bypass surgery     Dr. Arbie Cookey  . Ejection fraction     Normal LV, nuclear, 2008, ( no echo data as July 31, 2010)  . Sinus bradycardia  Asymptomatic  . Carotid artery disease     Doppler 08/10/2010,  no change, RICA 0-39%, LICA 50-69%  . Neck pain     November, 2012  . Dizziness     November, 2012  . Myocardial infarction 1999  . Cancer     colon    Past Surgical History  Procedure Date  . Coronary artery bypass graft 2000  . Wrist surgery     cyst removal  . Pr vein bypass graft,aorto-fem-pop 07/19/1999    right  . Pr vein bypass graft,aorto-fem-pop 05/02/2006    left    Patient Active Problem List  Diagnosis  . DYSLIPIDEMIA  . EDEMA  . CHEST PAIN, PRECORDIAL  . CORONARY ARTERY BYPASS GRAFT, HX OF  . Renal artery stenosis  . Coronary artery disease  . Hx of CABG  . S/P femoropopliteal bypass surgery  . Ejection fraction  . Sinus bradycardia  . Carotid artery disease  . Neck pain  . Dizziness  . Leaking abdominal aortic aneurysm  . Chronic total occlusion of artery of the extremities    ROS  Patient denies fever, chills, headache, sweats, rash, change in vision, change in hearing, chest pain, cough, nausea vomiting, urinary symptoms. All other systems are reviewed and are negative.  PHYSICAL EXAM  Patient is oriented to person time and place. Affect is normal. He's here with his wife. There is no jugulovenous distention. Lungs are clear.  Respiratory effort is nonlabored. Cardiac exam reveals S1 and S2. There no clicks or significant murmurs. The abdomen is soft. Is no peripheral edema.  Filed Vitals:   02/04/12 1545  BP: 142/80  Pulse: 78  Height: 5\' 10"  (1.778 m)  Weight: 229 lb 6.4 oz (104.055 kg)  SpO2: 93%   EKG is done today and reviewed by me. There is normal sinus rhythm. 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 There is no significant change.  ASSESSMENT & PLAN

## 2012-02-04 NOTE — Patient Instructions (Addendum)
Your physician wants you to follow-up in: 6 months  You will receive a reminder letter in the mail two months in advance. If you don't receive a letter, please call our office to schedule the follow-up appointment.  Your physician recommends that you continue on your current medications as directed. Please refer to the Current Medication list given to you today.  

## 2012-02-04 NOTE — Assessment & Plan Note (Signed)
His vascular disease is being followed very carefully by Dr. Arbie Cookey. No change in therapy.

## 2012-02-04 NOTE — Assessment & Plan Note (Signed)
Lipids are being treated. No change in therapy. 

## 2012-02-11 ENCOUNTER — Encounter: Payer: Self-pay | Admitting: Neurosurgery

## 2012-02-12 ENCOUNTER — Ambulatory Visit (INDEPENDENT_AMBULATORY_CARE_PROVIDER_SITE_OTHER): Payer: Medicare Other | Admitting: Neurosurgery

## 2012-02-12 ENCOUNTER — Encounter (INDEPENDENT_AMBULATORY_CARE_PROVIDER_SITE_OTHER): Payer: Medicare Other | Admitting: *Deleted

## 2012-02-12 ENCOUNTER — Encounter: Payer: Self-pay | Admitting: Neurosurgery

## 2012-02-12 VITALS — BP 146/74 | HR 52 | Resp 16 | Ht 70.5 in | Wt 226.0 lb

## 2012-02-12 DIAGNOSIS — Z48812 Encounter for surgical aftercare following surgery on the circulatory system: Secondary | ICD-10-CM

## 2012-02-12 DIAGNOSIS — M7989 Other specified soft tissue disorders: Secondary | ICD-10-CM

## 2012-02-12 DIAGNOSIS — I714 Abdominal aortic aneurysm, without rupture, unspecified: Secondary | ICD-10-CM

## 2012-02-12 DIAGNOSIS — I70219 Atherosclerosis of native arteries of extremities with intermittent claudication, unspecified extremity: Secondary | ICD-10-CM

## 2012-02-12 DIAGNOSIS — I7092 Chronic total occlusion of artery of the extremities: Secondary | ICD-10-CM

## 2012-02-12 NOTE — Progress Notes (Signed)
[facility] [place] [facility] Office Note  CC: AAA surveillance Referring Physician: Early  History of Present Illness: 71 year old male patient of Dr. Arbie Cookey is followed for known small AAA. The patient denies any signs or symptoms of AAA rupture including unusual abdominal or back pain. The patient states he is otherwise doing well and has no new medical diagnoses or recent surgery.  Past Medical History  Diagnosis Date  . Edema   . Hypertension   . Renal artery stenosis     50-70%  . Chest pain     precordial. mild chronic .[name]... nonischemic  . Coronary artery disease     Nuclear, January, 2008, no ischemia  . Sinus bradycardia   . Dyslipidemia   . Hx of CABG     2000  . S/P femoropopliteal bypass surgery     Dr. Arbie Cookey  . Ejection fraction     Normal LV, nuclear, 2008, ( no echo data as July 31, 2010)  . Sinus bradycardia     Asymptomatic  . Carotid artery disease     Doppler 08/10/2010,  no change, RICA 0-39%, LICA 50-69%  . Neck pain     November, 2012  . Dizziness     November, 2012  . Myocardial infarction 1999  . Diabetes mellitus without complication   . Cancer     Prostate    ROS: [x]  Positive   [ ]  Denies    General: [ ]  Weight loss, [ ]  Fever, [ ]  chills Neurologic: [ ]  Dizziness, [ ]  Blackouts, [ ]  Seizure [ ]  Stroke, [ ]  "Mini stroke", [ ]  Slurred speech, [ ]  Temporary blindness; [ ]  weakness in arms or legs, [ ]  Hoarseness Cardiac: [ ]  Chest pain/pressure, [ ]  Shortness of breath at rest [ ]  Shortness of breath with exertion, [ ]  Atrial fibrillation or irregular heartbeat Vascular: [ ]  Pain in legs with walking, [ ]  Pain in legs at rest, [ ]  Pain in legs at night,  [ ]  Non-healing ulcer, [ ]  Blood clot in vein/DVT,   Pulmonary: [ ]  Home oxygen, [ ]  Productive cough, [ ]  Coughing up blood, [ ]  Asthma,  [ ]  Wheezing Musculoskeletal:  [ ]  Arthritis, [ ]  Low back pain, [ ]  Joint pain Hematologic: [ ]  Easy Bruising, [ ]  Anemia; [ ]   Hepatitis Gastrointestinal: [ ]  Blood in stool, [ ]  Gastroesophageal Reflux/heartburn, [ ]  Trouble swallowing Urinary: [ ]  chronic Kidney disease, [ ]  on HD - [ ]  MWF or [ ]  TTHS, [ ]  Burning with urination, [ ]  Difficulty urinating Skin: [ ]  Rashes, [ ]  Wounds Psychological: [ ]  Anxiety, [ ]  Depression   Social History History  Substance Use Topics  . Smoking status: Former Smoker    Quit date: 04/02/1998  . Smokeless tobacco: Not on file  . Alcohol Use: 0.0 oz/week    0-2 drink(s) per week    Family History Family History  Problem Relation Age of Onset  . Coronary artery disease      family hx of  . Deep vein thrombosis Father   . Cancer Sister     lung  . Diabetes Sister   . Heart disease Sister   . Hyperlipidemia Sister   . Hypertension Sister   . Cancer Brother     "crab cancer"  . Heart disease Brother   . Heart attack Brother   . Cancer Sister     lung  . Cancer Sister  breast  . Hypertension Mother     Allergies  Allergen Reactions  . Niacin     Current Outpatient Prescriptions  Medication Sig Dispense Refill  . amLODipine (NORVASC) 10 MG tablet Take 10 mg by mouth daily.        [name] aspirin 325 MG EC tablet Take 325 mg by mouth daily.        . furosemide (LASIX) 40 MG tablet Take 40 mg by mouth daily.        [name] HYDROcodone-acetaminophen (VICODIN) 5-500 MG per tablet Take 1 tablet by mouth every 6 (six) hours as needed.        . isosorbide dinitrate (ISOCHRON) 40 MG CR tablet Take one-half (1/2) tablet twice a day  90 tablet  3  . losartan (COZAAR) 100 MG tablet Take 100 mg by mouth daily.        . metFORMIN (GLUCOPHAGE) 1000 MG tablet Take 1,000 mg by mouth 2 (two) times daily with a meal.        . Omega-3 Fatty Acids (FISH OIL) 1000 MG CAPS Take 1 capsule by mouth daily.        . simvastatin (ZOCOR) 20 MG tablet Take 1 tablet (20 mg total) by mouth at bedtime.  90 tablet  3  . warfarin (COUMADIN) 2 MG tablet Take 2 mg by mouth daily. As directed by  Coumadin clinic         Physical Examination  Filed Vitals:   02/12/12 0936  BP: 146/74  Pulse: 52  Resp: 16    Body mass index is 31.97 kg/(m^2).  General:  WDWN in NAD Gait: Normal HEENT: WNL Eyes: Pupils equal Pulmonary: normal non-labored breathing , without Rales, rhonchi,  wheezing Cardiac: RRR, without  Murmurs, rubs or gallops; No carotid bruits Abdomen: soft, NT, no masses Skin: no rashes, ulcers noted Vascular Exam/Pulses: Palpable lower extremity pulses bilaterally, femoral pulses are palpable, there is no abdominal mass palpated  Extremities without ischemic changes, no Gangrene , no cellulitis; no open wounds;  Musculoskeletal: no muscle wasting or atrophy  Neurologic: A&O X 3; Appropriate Affect ; SENSATION: normal; MOTOR FUNCTION:  moving all extremities equally. Speech is fluent/normal  Non-Invasive Vascular Imaging: AAA duplex today shows a maximum diameter of 3.8 proximally with a distal of 4.3 which is consistent with previous exam  ASSESSMENT/PLAN: Asymptomatic patient that will followup in may of 2014 with lower extremity studies as well as repeat AAA duplex. The patient's questions were encouraged and answered, he is in agreement with this plan.  Lauree Chandler ANP  Clinic M.D.: [facility]

## 2012-05-08 ENCOUNTER — Other Ambulatory Visit (HOSPITAL_COMMUNITY): Payer: Self-pay | Admitting: Family Medicine

## 2012-05-08 ENCOUNTER — Ambulatory Visit (HOSPITAL_COMMUNITY)
Admission: RE | Admit: 2012-05-08 | Discharge: 2012-05-08 | Disposition: A | Discharge status: Home / Self Care | Payer: Medicare Other | Source: Ambulatory Visit | Attending: Family Medicine | Admitting: Family Medicine

## 2012-05-08 DIAGNOSIS — R059 Cough, unspecified: Secondary | ICD-10-CM | POA: Insufficient documentation

## 2012-05-08 DIAGNOSIS — J209 Acute bronchitis, unspecified: Secondary | ICD-10-CM

## 2012-05-08 DIAGNOSIS — R05 Cough: Secondary | ICD-10-CM | POA: Insufficient documentation

## 2012-05-20 ENCOUNTER — Encounter (INDEPENDENT_AMBULATORY_CARE_PROVIDER_SITE_OTHER): Payer: Medicare Other

## 2012-05-20 DIAGNOSIS — I6529 Occlusion and stenosis of unspecified carotid artery: Secondary | ICD-10-CM

## 2012-06-03 ENCOUNTER — Encounter: Payer: Self-pay | Admitting: Cardiology

## 2012-07-24 ENCOUNTER — Other Ambulatory Visit: Payer: Self-pay | Admitting: *Deleted

## 2012-07-24 DIAGNOSIS — I714 Abdominal aortic aneurysm, without rupture: Secondary | ICD-10-CM

## 2012-07-25 ENCOUNTER — Encounter (INDEPENDENT_AMBULATORY_CARE_PROVIDER_SITE_OTHER): Payer: Medicare Other | Admitting: Vascular Surgery

## 2012-07-25 DIAGNOSIS — Z48812 Encounter for surgical aftercare following surgery on the circulatory system: Secondary | ICD-10-CM

## 2012-07-25 DIAGNOSIS — I714 Abdominal aortic aneurysm, without rupture: Secondary | ICD-10-CM

## 2012-07-25 DIAGNOSIS — I739 Peripheral vascular disease, unspecified: Secondary | ICD-10-CM

## 2012-07-30 ENCOUNTER — Encounter: Payer: Self-pay | Admitting: Vascular Surgery

## 2012-07-30 ENCOUNTER — Other Ambulatory Visit: Payer: Self-pay | Admitting: *Deleted

## 2012-07-30 DIAGNOSIS — I739 Peripheral vascular disease, unspecified: Secondary | ICD-10-CM

## 2012-07-30 DIAGNOSIS — I714 Abdominal aortic aneurysm, without rupture: Secondary | ICD-10-CM

## 2012-08-06 ENCOUNTER — Ambulatory Visit: Payer: Medicare Other | Admitting: Neurosurgery

## 2012-08-12 ENCOUNTER — Ambulatory Visit: Payer: Medicare Other | Admitting: Neurosurgery

## 2012-08-29 ENCOUNTER — Other Ambulatory Visit: Payer: Self-pay

## 2012-08-29 MED ORDER — SIMVASTATIN 20 MG PO TABS: 20.0000 mg | ORAL_TABLET | Freq: Every day | ORAL | Status: DC

## 2012-08-29 MED ORDER — ISOSORBIDE DINITRATE ER 40 MG PO TBCR: EXTENDED_RELEASE_TABLET | ORAL | Status: DC

## 2012-08-29 NOTE — Telephone Encounter (Signed)
isosorbide dinitrate (ISOCHRON) 40 MG CR tablet  Take one-half (1/2) tablet twice a day   90 tablet   simvastatin (ZOCOR) 20 MG tablet  Take 1 tablet (20 mg total) by mouth at bedtime.   90 tablet   Patient Instructions:  Your physician wants you to follow-up in: 6 months. Your physician recommends that you continue on your current medications as directed. Please refer to the Current Medication list given to you today. 10/23/2011  1:16 PM Willa Rough, MD [facility] [phone]

## 2012-09-23 ENCOUNTER — Ambulatory Visit: Payer: Medicare Other | Admitting: Cardiology

## 2012-09-24 ENCOUNTER — Encounter (HOSPITAL_COMMUNITY): Payer: Self-pay | Admitting: Emergency Medicine

## 2012-09-24 ENCOUNTER — Emergency Department (HOSPITAL_COMMUNITY): Payer: Medicare Other

## 2012-09-24 ENCOUNTER — Inpatient Hospital Stay (HOSPITAL_COMMUNITY)
Admission: EM | Admit: 2012-09-24 | Discharge: 2012-09-27 | DRG: 287 | Disposition: A | Discharge status: Home / Self Care | Payer: Medicare Other | Attending: Cardiology | Admitting: Cardiology

## 2012-09-24 ENCOUNTER — Other Ambulatory Visit: Payer: Self-pay

## 2012-09-24 DIAGNOSIS — Z9889 Other specified postprocedural states: Secondary | ICD-10-CM

## 2012-09-24 DIAGNOSIS — I70219 Atherosclerosis of native arteries of extremities with intermittent claudication, unspecified extremity: Secondary | ICD-10-CM | POA: Diagnosis present

## 2012-09-24 DIAGNOSIS — Z95828 Presence of other vascular implants and grafts: Secondary | ICD-10-CM

## 2012-09-24 DIAGNOSIS — I4949 Other premature depolarization: Secondary | ICD-10-CM | POA: Diagnosis present

## 2012-09-24 DIAGNOSIS — I252 Old myocardial infarction: Secondary | ICD-10-CM

## 2012-09-24 DIAGNOSIS — E119 Type 2 diabetes mellitus without complications: Secondary | ICD-10-CM | POA: Diagnosis present

## 2012-09-24 DIAGNOSIS — I779 Disorder of arteries and arterioles, unspecified: Secondary | ICD-10-CM | POA: Diagnosis present

## 2012-09-24 DIAGNOSIS — I200 Unstable angina: Secondary | ICD-10-CM | POA: Diagnosis present

## 2012-09-24 DIAGNOSIS — I701 Atherosclerosis of renal artery: Secondary | ICD-10-CM | POA: Diagnosis present

## 2012-09-24 DIAGNOSIS — Z8546 Personal history of malignant neoplasm of prostate: Secondary | ICD-10-CM

## 2012-09-24 DIAGNOSIS — I10 Essential (primary) hypertension: Secondary | ICD-10-CM | POA: Diagnosis present

## 2012-09-24 DIAGNOSIS — I739 Peripheral vascular disease, unspecified: Secondary | ICD-10-CM | POA: Diagnosis present

## 2012-09-24 DIAGNOSIS — I2510 Atherosclerotic heart disease of native coronary artery without angina pectoris: Principal | ICD-10-CM | POA: Diagnosis present

## 2012-09-24 DIAGNOSIS — E785 Hyperlipidemia, unspecified: Secondary | ICD-10-CM | POA: Diagnosis present

## 2012-09-24 DIAGNOSIS — I25810 Atherosclerosis of coronary artery bypass graft(s) without angina pectoris: Secondary | ICD-10-CM | POA: Diagnosis present

## 2012-09-24 DIAGNOSIS — Z951 Presence of aortocoronary bypass graft: Secondary | ICD-10-CM

## 2012-09-24 DIAGNOSIS — Z87891 Personal history of nicotine dependence: Secondary | ICD-10-CM

## 2012-09-24 HISTORY — DX: Gastro-esophageal reflux disease without esophagitis: K21.9

## 2012-09-24 LAB — BASIC METABOLIC PANEL
BUN: 8 mg/dL (ref 6–23)
CO2: 27 mEq/L (ref 19–32)
Chloride: 103 mEq/L (ref 96–112)
Creatinine, Ser: 0.74 mg/dL (ref 0.50–1.35)

## 2012-09-24 LAB — CBC WITH DIFFERENTIAL/PLATELET
Eosinophils Relative: 2 % (ref 0–5)
HCT: 43.5 % (ref 39.0–52.0)
Hemoglobin: 14.9 g/dL (ref 13.0–17.0)
Lymphocytes Relative: 23 % (ref 12–46)
MCHC: 34.3 g/dL (ref 30.0–36.0)
MCV: 90.1 fL (ref 78.0–100.0)
Monocytes Absolute: 0.6 10*3/uL (ref 0.1–1.0)
Monocytes Relative: 9 % (ref 3–12)
Neutro Abs: 4.6 10*3/uL (ref 1.7–7.7)

## 2012-09-24 LAB — TROPONIN I
Troponin I: <0.3 ng/mL (ref <0.30)
Troponin I: <0.3 ng/mL (ref <0.30)

## 2012-09-24 LAB — PRO B NATRIURETIC PEPTIDE: Pro B Natriuretic peptide (BNP): 146 pg/mL — ABNORMAL HIGH (ref 0–125)

## 2012-09-24 LAB — TSH: TSH: 0.531 u[IU]/mL (ref 0.350–4.500)

## 2012-09-24 MED ORDER — NITROGLYCERIN IN D5W 200-5 MCG/ML-% IV SOLN
5.0000 ug/min | INTRAVENOUS | Status: DC
  Administered 2012-09-24: 5 ug/min via INTRAVENOUS

## 2012-09-24 MED ORDER — METOPROLOL TARTRATE 12.5 MG HALF TABLET
12.5000 mg | ORAL_TABLET | Freq: Two times a day (BID) | ORAL | Status: DC
  Administered 2012-09-24 – 2012-09-27 (×6): 12.5 mg via ORAL
  Filled 2012-09-24 (×7): qty 1

## 2012-09-24 MED ORDER — SODIUM CHLORIDE 0.9 % IJ SOLN: 3.0000 mL | INTRAMUSCULAR | Status: DC | PRN

## 2012-09-24 MED ORDER — SODIUM CHLORIDE 0.9 % IV SOLN: 250.0000 mL | INTRAVENOUS | Status: DC | PRN

## 2012-09-24 MED ORDER — DIAZEPAM 5 MG PO TABS
5.0000 mg | ORAL_TABLET | ORAL | Status: AC
  Administered 2012-09-25: 5 mg via ORAL
  Filled 2012-09-24: qty 1

## 2012-09-24 MED ORDER — HEPARIN (PORCINE) IN NACL 100-0.45 UNIT/ML-% IJ SOLN
1600.0000 [IU]/h | INTRAMUSCULAR | Status: DC
  Administered 2012-09-24: 1150 [IU]/h via INTRAVENOUS
  Administered 2012-09-25 – 2012-09-26 (×2): 1600 [IU]/h via INTRAVENOUS
  Filled 2012-09-24 (×4): qty 250

## 2012-09-24 MED ORDER — SODIUM CHLORIDE 0.9 % IJ SOLN
3.0000 mL | Freq: Two times a day (BID) | INTRAMUSCULAR | Status: DC
  Administered 2012-09-26: 3 mL via INTRAVENOUS

## 2012-09-24 MED ORDER — SODIUM CHLORIDE 0.9 % IJ SOLN: 3.0000 mL | Freq: Two times a day (BID) | INTRAMUSCULAR | Status: DC

## 2012-09-24 MED ORDER — ONDANSETRON HCL 4 MG/2ML IJ SOLN: 4.0000 mg | Freq: Four times a day (QID) | INTRAMUSCULAR | Status: DC | PRN

## 2012-09-24 MED ORDER — NAPHAZOLINE HCL 0.1 % OP SOLN
1.0000 [drp] | Freq: Four times a day (QID) | OPHTHALMIC | Status: DC | PRN
  Filled 2012-09-24: qty 15

## 2012-09-24 MED ORDER — ALPRAZOLAM 0.25 MG PO TABS: 0.2500 mg | ORAL_TABLET | Freq: Two times a day (BID) | ORAL | Status: DC | PRN

## 2012-09-24 MED ORDER — ISOSORBIDE DINITRATE ER 40 MG PO TBCR
40.0000 mg | EXTENDED_RELEASE_TABLET | Freq: Every day | ORAL | Status: DC
  Administered 2012-09-25: 40 mg via ORAL
  Filled 2012-09-24 (×2): qty 1

## 2012-09-24 MED ORDER — NITROGLYCERIN 0.4 MG SL SUBL: 0.4000 mg | SUBLINGUAL_TABLET | SUBLINGUAL | Status: DC | PRN

## 2012-09-24 MED ORDER — ASPIRIN 81 MG PO CHEW
243.0000 mg | CHEWABLE_TABLET | Freq: Once | ORAL | Status: AC
  Administered 2012-09-24: 243 mg via ORAL
  Filled 2012-09-24: qty 3

## 2012-09-24 MED ORDER — NITROGLYCERIN 2 % TD OINT
0.5000 [in_us] | TOPICAL_OINTMENT | Freq: Four times a day (QID) | TRANSDERMAL | Status: DC
  Administered 2012-09-24: 0.5 [in_us] via TOPICAL
  Filled 2012-09-24: qty 1

## 2012-09-24 MED ORDER — SIMVASTATIN 20 MG PO TABS
20.0000 mg | ORAL_TABLET | Freq: Every day | ORAL | Status: DC
  Administered 2012-09-24 – 2012-09-26 (×3): 20 mg via ORAL
  Filled 2012-09-24 (×4): qty 1

## 2012-09-24 MED ORDER — INSULIN ASPART 100 UNIT/ML ~~LOC~~ SOLN
0.0000 [IU] | Freq: Three times a day (TID) | SUBCUTANEOUS | Status: DC
  Administered 2012-09-26 – 2012-09-27 (×2): 2 [IU] via SUBCUTANEOUS

## 2012-09-24 MED ORDER — LOSARTAN POTASSIUM 50 MG PO TABS
100.0000 mg | ORAL_TABLET | Freq: Every day | ORAL | Status: DC
  Administered 2012-09-25 – 2012-09-27 (×3): 100 mg via ORAL
  Filled 2012-09-24 (×3): qty 2

## 2012-09-24 MED ORDER — LOSARTAN POTASSIUM 50 MG PO TABS: 100.0000 mg | ORAL_TABLET | Freq: Every day | ORAL | Status: DC

## 2012-09-24 MED ORDER — NITROGLYCERIN IN D5W 200-5 MCG/ML-% IV SOLN
5.0000 ug/min | INTRAVENOUS | Status: DC
  Filled 2012-09-24: qty 250

## 2012-09-24 MED ORDER — AMLODIPINE BESYLATE 10 MG PO TABS
10.0000 mg | ORAL_TABLET | Freq: Every day | ORAL | Status: DC
  Administered 2012-09-24 – 2012-09-27 (×4): 10 mg via ORAL
  Filled 2012-09-24 (×4): qty 1

## 2012-09-24 MED ORDER — ACETAMINOPHEN 325 MG PO TABS: 650.0000 mg | ORAL_TABLET | ORAL | Status: DC | PRN

## 2012-09-24 MED ORDER — ASPIRIN EC 81 MG PO TBEC
81.0000 mg | DELAYED_RELEASE_TABLET | Freq: Every day | ORAL | Status: DC
  Administered 2012-09-25 – 2012-09-27 (×3): 81 mg via ORAL
  Filled 2012-09-24 (×3): qty 1

## 2012-09-24 MED ORDER — HYDROCODONE-ACETAMINOPHEN 5-325 MG PO TABS: 1.0000 | ORAL_TABLET | Freq: Four times a day (QID) | ORAL | Status: DC | PRN

## 2012-09-24 MED ORDER — ASPIRIN 81 MG PO CHEW: 324.0000 mg | CHEWABLE_TABLET | Freq: Once | ORAL | Status: DC

## 2012-09-24 MED ORDER — FUROSEMIDE 40 MG PO TABS
40.0000 mg | ORAL_TABLET | Freq: Every day | ORAL | Status: DC
  Administered 2012-09-24 – 2012-09-26 (×3): 40 mg via ORAL
  Filled 2012-09-24 (×4): qty 1

## 2012-09-24 NOTE — Consult Note (Signed)
ANTICOAGULATION CONSULT NOTE - Initial Consult  Pharmacy Consult:  Heparin Indication: ACS/STEMI  Allergies: Allergies  Allergen Reactions  . Niacin Itching   Heparin Dosing Weight:  95.6 kg  Vital Signs: BP 149/65  Pulse 67  Temp(Src) 98.1 F (36.7 C) (Oral)  Resp 17  SpO2 93%  Active Problems: Principal Problem:   Unstable angina Active Problems:   DYSLIPIDEMIA   Hx of CABG   S/P femoropopliteal bypass surgery Chronic Coumadin Therapy  Labs:  Recent Labs  09/24/12 1120  HGB 14.9  HCT 43.5  PLT 194  LABPROT 20.0*  INR 1.76*  CREATININE 0.74   Lab Results  Component Value Date   INR 1.76* 09/24/2012   Medical / Surgical History: Past Medical History  Diagnosis Date  . Edema   . Hypertension   . Renal artery stenosis     50-70%  . Chest pain     precordial. mild chronic .[name]... nonischemic  . Coronary artery disease     Nuclear, January, 2008, no ischemia  . Sinus bradycardia   . Dyslipidemia   . Hx of CABG     2000  . S/P femoropopliteal bypass surgery     Dr. Arbie Cookey  . Ejection fraction     Normal LV, nuclear, 2008, ( no echo data as July 31, 2010)  . Sinus bradycardia     Asymptomatic  . Carotid artery disease     Doppler 08/10/2010,  no change, RICA 0-39%, LICA 50-69%  . Neck pain     November, 2012  . Dizziness     November, 2012  . Myocardial infarction 1999  . Diabetes mellitus without complication   . Cancer     Prostate   Past Surgical History  Procedure Laterality Date  . Coronary artery bypass graft  2000  . Wrist surgery      cyst removal  . Pr vein bypass graft,aorto-fem-pop  07/19/1999    right  . Pr vein bypass graft,aorto-fem-pop  05/02/2006    left    Medications:  Medication Sig  . amLODipine (NORVASC) 10 MG tablet Take 10 mg by mouth daily.    [name] aspirin 325 MG EC tablet Take 325 mg by mouth daily as needed for pain.   . furosemide (LASIX) 40 MG tablet Take 40 mg by mouth daily.    [name] HYDROcodone-acetaminophen  (VICODIN) 5-500 MG per tablet Take 1 tablet by mouth every 6 (six) hours as needed for pain.   . isosorbide dinitrate (ISOCHRON) 40 MG CR tablet Take one-half (1/2) tablet twice a day  . losartan (COZAAR) 100 MG tablet Take 100 mg by mouth daily.    . metFORMIN (GLUCOPHAGE) 1000 MG tablet Take 1,000 mg by mouth 2 (two) times daily with a meal.    . simvastatin (ZOCOR) 20 MG tablet Take 1 tablet (20 mg total) by mouth at bedtime.  [name] tetrahydrozoline-zinc (VISINE-AC) 0.05-0.25 % ophthalmic solution 2 drops 3 (three) times daily as needed.  . warfarin (COUMADIN) 2 MG tablet Take 2 mg by mouth daily. As directed by Coumadin clinic    Scheduled:  . amLODipine  10 mg Oral Daily  . [START ON 09/25/2012] aspirin EC  81 mg Oral Daily  . [START ON 09/25/2012] diazepam  5 mg Oral On Call  . furosemide  40 mg Oral Daily  . insulin aspart  0-15 Units Subcutaneous TID WC  . isosorbide dinitrate  40 mg Oral Daily  . [START ON 09/25/2012] losartan  100 mg Oral Daily  .  metoprolol tartrate  12.5 mg Oral BID  . simvastatin  20 mg Oral QHS  . sodium chloride  3 mL Intravenous Q12H  . sodium chloride  3 mL Intravenous Q12H   Infusions:  . nitroGLYCERIN     Assessment:  71 y.o.male with history of coronary disease status post CABG in 2000.[name] Patient has chronic chest tightness ever since his bypass surgery. Over the past 24 hours, however, he has experienced increased symptoms of left precordial chest tightness  Patient is admitted for cardiac catherization and evaluation.  Heparin bridging to be started.  INR 1.76.  Hgb 14.9, Platlets 194.  Goal of Therapy:   Heparin level 0.3-0.7 units/ml      Plan:   Begin Heparin, no bolus, at 1150 units/hr [12 units/kg/hr]  Check Heparin level in 6 hours, then Daily Heparin level and CBC while on Heparin.  Monitor for bleeding complications   Laurena Bering,  Pharm.D.. 09/24/2012,  5:06 PM

## 2012-09-24 NOTE — ED Notes (Signed)
Pt undressed, in gown, on monitor, continuous pulse oximetry and blood pressure cuff 

## 2012-09-24 NOTE — ED Notes (Addendum)
Per [facility]: pt from home. Had an appt with his MD today for blood sugar and A1C check.  Told MD about CP that started yesterday, MD called EMS. Pt denies Chest PAIN at this time.  Describes it as tightness and states "it's always there since I had my (heart) surgery." Pt given 81 ASA and 2 sl ntg at MD office.

## 2012-09-24 NOTE — H&P (Signed)
Physician History and Physical    Patient ID: Troy [name] MRN: [id number] DOB/AGE: May 07, 1940 72 y.o. Admit date: 09/24/2012  Primary Care Physician: Assunta Found MD Primary Cardiologist Willa Rough MD  HPI: Troy [name] is seen today for evaluation of chest tightness. He is a pleasant 72 year old white male with history of coronary disease status post CABG in 2000 by Dr. Cornelius Moras. His last Myoview evaluation was in 2008. Patient reports that he has chronic chest tightness ever since his bypass surgery. Over the past 24 hours, however, he has experienced increased symptoms of left precordial chest tightness. The symptoms were associated with indigestion and some nausea yesterday. He denies any increase shortness of breath or radiation of his pain. He states he really doesn't have pain but it's more of a tightness. His activity is fairly limited by peripheral vascular disease. He quit smoking in 2000. He does have a history of hypertension, diabetes mellitus type 2, and dyslipidemia. He was seen earlier today by his primary care who referred him to the emergency department. ECG and monitor demonstrates frequent PVCs. There is otherwise no evidence of acute ischemia.  Review of systems complete and found to be negative unless listed above. Patient denies any significant palpitations. He states he is taking warfarin because of his prior femoropopliteal bypass grafts. He has not been taking aspirin recently. He reports intolerance to niacin. Past Medical History  Diagnosis Date  . Edema   . Hypertension   . Renal artery stenosis     50-70%  . Chest pain     precordial. mild chronic .[name]... nonischemic  . Coronary artery disease     Nuclear, January, 2008, no ischemia  . Sinus bradycardia   . Dyslipidemia   . Hx of CABG     2000  . S/P femoropopliteal bypass surgery     Dr. Arbie Cookey  . Ejection fraction     Normal LV, nuclear, 2008, ( no echo data as July 31, 2010)  . Sinus bradycardia    Asymptomatic  . Carotid artery disease     Doppler 08/10/2010,  no change, RICA 0-39%, LICA 50-69%  . Neck pain     November, 2012  . Dizziness     November, 2012  . Myocardial infarction 1999  . Diabetes mellitus without complication   . Cancer     Prostate    Family History  Problem Relation Age of Onset  . Coronary artery disease      family hx of  . Deep vein thrombosis Father   . Cancer Sister     lung  . Diabetes Sister   . Heart disease Sister   . Hyperlipidemia Sister   . Hypertension Sister   . Cancer Brother     "crab cancer"  . Heart disease Brother   . Heart attack Brother   . Cancer Sister     lung  . Cancer Sister     breast  . Hypertension Mother     History   Social History  . Marital Status: Married    Spouse Name: N/A    Number of Children: N/A  . Years of Education: N/A   Occupational History  . Not on file.   Social History Main Topics  . Smoking status: Former Smoker    Quit date: 04/02/1998  . Smokeless tobacco: Not on file  . Alcohol Use: 0.0 oz/week    0-2 drink(s) per week  . Drug Use: No  . Sexually Active: Not on file  Other Topics Concern  . Not on file   Social History Narrative  . No narrative on file    Past Surgical History  Procedure Laterality Date  . Coronary artery bypass graft  2000  . Wrist surgery      cyst removal  . Pr vein bypass graft,aorto-fem-pop  07/19/1999    right  . Pr vein bypass graft,aorto-fem-pop  05/02/2006    left    Medications: Amlodipine 10 mg daily, Lasix 40 mg daily, Vicodin when necessary, isosorbide dinitrate 20 mg twice a day, losartan 100 mg daily, metformin 1000 mg twice daily, fish oil 1000 mg daily, simvastatin 20 mg daily, Coumadin 2 mg daily.  Physical Exam: Blood pressure 128/67, pulse 64, temperature 98.1 F (36.7 C), temperature source Oral, resp. rate 18, SpO2 96.00%.  He is a pleasant, elderly white male in no acute distress. HEENT: Normocephalic, atraumatic. Pupils are  equal round and reactive to light accommodation. Sclera clear. Oropharynx is clear. Neck is supple without JVD, adenopathy, thyromegaly, or bruits. Lungs: Clear Cardiovascular: Regular rate and rhythm, frequent extrasystoles, normal S1 and S2, no gallop, murmur, or click. Abdomen: Obese, soft, nontender. No masses or bruits. No hepatosplenomegaly. Bowel sounds are positive. Extremities: Old surgical scars in both groins. Femoral pulses are 2+ and symmetric. Skin: Warm and dry Neuro: Alert and oriented x3. Cranial nerves II through XII are intact.  Labs:   Lab Results  Component Value Date   WBC 7.0 09/24/2012   HGB 14.9 09/24/2012   HCT 43.5 09/24/2012   MCV 90.1 09/24/2012   PLT 194 09/24/2012    Recent Labs Lab 09/24/12 1120  NA 141  K 3.9  CL 103  CO2 27  BUN 8  CREATININE 0.74  CALCIUM 8.8  GLUCOSE 119*   Lab Results  Component Value Date   TROPONINI <0.30 09/24/2012    Lab Results  Component Value Date   CHOL 115 09/09/2007   CHOL 133 03/06/2007   CHOL 129 10/10/2006   Lab Results  Component Value Date   HDL 19.9* 09/09/2007   HDL 17.9* 03/06/2007   HDL 52.8* 10/10/2006   Lab Results  Component Value Date   LDLCALC 65 09/09/2007   LDLCALC 78 03/06/2007   LDLCALC 68 10/10/2006   Lab Results  Component Value Date   TRIG 149 09/09/2007   TRIG 185* 03/06/2007   TRIG 187* 10/10/2006   Lab Results  Component Value Date   CHOLHDL 5.8 CALC 09/09/2007   CHOLHDL 7.4 CALC 03/06/2007   CHOLHDL 5.6 CALC 10/10/2006   BNP: 146  Protime: 20, INR 1.76   Radiology:CHEST - 2 VIEW  Comparison: Chest x-ray of 05/08/2012  Findings: No active infiltrate or effusion is seen. Mild cardiomegaly is stable. Median sternotomy sutures are noted from prior CABG. There are diffuse degenerative changes throughout the thoracic spine.  IMPRESSION: No active lung disease. Stable mild cardiomegaly.   Original Report Authenticated By: Dwyane Dee, M.D.   EKG: Normal sinus rhythm with frequent  PVCs, incomplete right bundle branch block. No acute ST/T-wave changes   ASSESSMENT AND PLAN:  1. Acute chest tightness. Symptoms are concerning for unstable angina. Patient is 14 years status post CABG and has not had a recent ischemic evaluation. He does have chronic symptoms of tightness but his current symptoms are more  Severe. Patient will be admitted overnight for observation. We'll cycle cardiac enzymes and ECG. We'll start IV nitroglycerin and heparin. Coumadin will be held. Discussed with patient further evaluation including possible  nuclear stress testing versus cardiac catheterization. I also discussed with Dr. Myrtis Ser. I feel that cardiac catheterization will be the more appropriate evaluation. We'll check INR in the morning and if satisfactory we'll proceed with cardiac catheterization. We will also start low-dose beta blocker therapy.  2. CAD status post CABG in 2014. I am unable to locate the op note in Epic. By chest x-ray he appears to have 3 vein graft markers.  3. PAD status post bilateral femoropopliteal bypass grafting.  4. Hypertension-controlled  5. PVCs next  6. Diabetes mellitus type 2.  7. Dyslipidemia.  SignedTheron Arista [facility] 09/24/2012, 3:16 PM

## 2012-09-24 NOTE — ED Provider Notes (Signed)
History    CSN: [id number] Arrival date & time 09/24/12  1004  First MD Initiated Contact with Patient 09/24/12 1018     Chief Complaint  Patient presents with  . Chest Pain   (Consider location/radiation/quality/duration/timing/severity/associated sxs/prior Treatment) HPI 72 year old male presents to emergency room with complaint of left sided chest pressure.  Patient reports he always has some chest pressure since his CABG that was done in 2000.  Today, however, the chest pressure was much worse than usual, and associated with shortness of breath, and nausea.  Patient went to a scheduled appointment with his primary care doctor, who called 911, and had him transferred to the emergency department.  Patient reports he was to see his cardiologist, yesterday, but was unable to due to a illness with his wife.  Patient reports pressure in his chest improved after sublingual nitroglycerin given to him by his doctors office.  No swelling in his legs.  He reports he is compliant with all of his medications.  Patient seen by Dr. Renette Butters who is his primary care doctor, and Dr. Myrtis Ser, who is his cardiologist.  Patient has past medical history of coronary disease, status post CABG in 2000, diabetes, hypertension, peripheral vascular disease  Past Medical History  Diagnosis Date  . Edema   . Hypertension   . Renal artery stenosis     50-70%  . Chest pain     precordial. mild chronic .[name]... nonischemic  . Coronary artery disease     Nuclear, January, 2008, no ischemia  . Sinus bradycardia   . Dyslipidemia   . Hx of CABG     2000  . S/P femoropopliteal bypass surgery     Dr. Arbie Cookey  . Ejection fraction     Normal LV, nuclear, 2008, ( no echo data as July 31, 2010)  . Sinus bradycardia     Asymptomatic  . Carotid artery disease     Doppler 08/10/2010,  no change, RICA 0-39%, LICA 50-69%  . Neck pain     November, 2012  . Dizziness     November, 2012  . Myocardial infarction 1999  . Diabetes  mellitus without complication   . Cancer     Prostate   Past Surgical History  Procedure Laterality Date  . Coronary artery bypass graft  2000  . Wrist surgery      cyst removal  . Pr vein bypass graft,aorto-fem-pop  07/19/1999    right  . Pr vein bypass graft,aorto-fem-pop  05/02/2006    left   Family History  Problem Relation Age of Onset  . Coronary artery disease      family hx of  . Deep vein thrombosis Father   . Cancer Sister     lung  . Diabetes Sister   . Heart disease Sister   . Hyperlipidemia Sister   . Hypertension Sister   . Cancer Brother     "crab cancer"  . Heart disease Brother   . Heart attack Brother   . Cancer Sister     lung  . Cancer Sister     breast  . Hypertension Mother    History  Substance Use Topics  . Smoking status: Former Smoker    Quit date: 04/02/1998  . Smokeless tobacco: Not on file  . Alcohol Use: 0.0 oz/week    0-2 drink(s) per week    Review of Systems  All other systems reviewed and are negative.   other than listed in history of present illness  Allergies  Niacin  Home Medications   Current Outpatient Rx  Name  Route  Sig  Dispense  Refill  . amLODipine (NORVASC) 10 MG tablet   Oral   Take 10 mg by mouth daily.           [name] aspirin 325 MG EC tablet   Oral   Take 325 mg by mouth daily as needed for pain.          . furosemide (LASIX) 40 MG tablet   Oral   Take 40 mg by mouth daily.           [name] HYDROcodone-acetaminophen (VICODIN) 5-500 MG per tablet   Oral   Take 1 tablet by mouth every 6 (six) hours as needed for pain.          . isosorbide dinitrate (ISOCHRON) 40 MG CR tablet      Take one-half (1/2) tablet twice a day   90 tablet   1   . losartan (COZAAR) 100 MG tablet   Oral   Take 100 mg by mouth daily.           . metFORMIN (GLUCOPHAGE) 1000 MG tablet   Oral   Take 1,000 mg by mouth 2 (two) times daily with a meal.           . simvastatin (ZOCOR) 20 MG tablet   Oral   Take 1  tablet (20 mg total) by mouth at bedtime.   90 tablet   1   . tetrahydrozoline-zinc (VISINE-AC) 0.05-0.25 % ophthalmic solution      2 drops 3 (three) times daily as needed.         . warfarin (COUMADIN) 2 MG tablet   Oral   Take 2 mg by mouth daily. As directed by Coumadin clinic           BP 128/67  Pulse 64  Temp(Src) 98.1 F (36.7 C) (Oral)  Resp 18  SpO2 96% Physical Exam  Nursing note and vitals reviewed. Constitutional: He is oriented to person, place, and time. He appears well-developed and well-nourished.  HENT:  Head: Normocephalic and atraumatic.  Nose: Nose normal.  Mouth/Throat: Oropharynx is clear and moist.  Eyes: Conjunctivae and EOM are normal. Pupils are equal, round, and reactive to light.  Neck: Normal range of motion. Neck supple. No JVD present. No tracheal deviation present. No thyromegaly present.  Cardiovascular: Normal rate, regular rhythm, normal heart sounds and intact distal pulses.  Exam reveals no gallop and no friction rub.   No murmur heard. Pulmonary/Chest: Effort normal and breath sounds normal. No stridor. No respiratory distress. He has no wheezes. He has no rales. He exhibits no tenderness.  Abdominal: Soft. Bowel sounds are normal. He exhibits no distension and no mass. There is no tenderness. There is no rebound and no guarding.  Musculoskeletal: Normal range of motion. He exhibits no edema and no tenderness.  Lymphadenopathy:    He has no cervical adenopathy.  Neurological: He is alert and oriented to person, place, and time. He exhibits normal muscle tone. Coordination normal.  Skin: Skin is warm and dry. No rash noted. No erythema. No pallor.  Psychiatric: He has a normal mood and affect. His behavior is normal. Judgment and thought content normal.    ED Course  Procedures (including critical care time) Labs Reviewed  BASIC METABOLIC PANEL - Abnormal; Notable for the following:    Glucose, Bld 119 (*)    All other components  within normal  limits  PROTIME-INR - Abnormal; Notable for the following:    Prothrombin Time 20.0 (*)    INR 1.76 (*)    All other components within normal limits  PRO B NATRIURETIC PEPTIDE - Abnormal; Notable for the following:    Pro B Natriuretic peptide (BNP) 146.0 (*)    All other components within normal limits  CBC WITH DIFFERENTIAL  TROPONIN I   Dg Chest 2 View  09/24/2012   *RADIOLOGY REPORT*  Clinical Data: Chest pain  CHEST - 2 VIEW  Comparison: Chest x-ray of 05/08/2012  Findings: No active infiltrate or effusion is seen.  Mild cardiomegaly is stable.  Median sternotomy sutures are noted from prior CABG.  There are diffuse degenerative changes throughout the thoracic spine.  IMPRESSION: No active lung disease.  Stable mild cardiomegaly.   Original Report Authenticated By: Dwyane Dee, M.D.    Date: 09/24/2012  Rate: 66  Rhythm: normal sinus rhythm and premature ventricular contractions (PVC)  QRS Axis: normal  Intervals: normal  ST/T Wave abnormalities: normal  Conduction Disutrbances:incomplete RBBB  Narrative Interpretation: PVC new from prior  Old EKG Reviewed: changes noted    No diagnosis found.  MDM  72 year old male with worsening of chest pressure from his baseline, improved with nitroglycerin.  Patient also reports shortness of breath and nausea.  Is new from prior.  Initial troponin is negative.  In he is normal.  He is having PVCs, not seen on prior EKGs.  Will discuss with cardiology whether they would like to consult in the emergency department or followup in the office.  Cardiology aware, will see patient in ED and help dispo.  Olivia Mackie, MD 09/25/12 [phone]

## 2012-09-25 ENCOUNTER — Encounter (HOSPITAL_COMMUNITY): Payer: Self-pay | Admitting: General Practice

## 2012-09-25 LAB — BASIC METABOLIC PANEL
CO2: 29 mEq/L (ref 19–32)
Chloride: 104 mEq/L (ref 96–112)
Glucose, Bld: 115 mg/dL — ABNORMAL HIGH (ref 70–99)
Potassium: 3.7 mEq/L (ref 3.5–5.1)
Sodium: 143 mEq/L (ref 135–145)

## 2012-09-25 LAB — CBC
HCT: 44.9 % (ref 39.0–52.0)
Hemoglobin: 14.9 g/dL (ref 13.0–17.0)
RBC: 4.95 MIL/uL (ref 4.22–5.81)
RDW: 14.1 % (ref 11.5–15.5)
WBC: 7.4 10*3/uL (ref 4.0–10.5)

## 2012-09-25 LAB — LIPID PANEL
Cholesterol: 129 mg/dL (ref 0–200)
Triglycerides: 152 mg/dL — ABNORMAL HIGH (ref <150)
VLDL: 30 mg/dL (ref 0–40)

## 2012-09-25 LAB — PROTIME-INR
INR: 1.94 — ABNORMAL HIGH (ref 0.00–1.49)
Prothrombin Time: 21.6 seconds — ABNORMAL HIGH (ref 11.6–15.2)

## 2012-09-25 LAB — GLUCOSE, CAPILLARY
Glucose-Capillary: 108 mg/dL — ABNORMAL HIGH (ref 70–99)
Glucose-Capillary: 101 mg/dL — ABNORMAL HIGH (ref 70–99)

## 2012-09-25 LAB — HEPARIN LEVEL (UNFRACTIONATED): Heparin Unfractionated: <0.1 IU/mL — ABNORMAL LOW (ref 0.30–0.70)

## 2012-09-25 MED ORDER — CLOPIDOGREL BISULFATE 75 MG PO TABS
300.0000 mg | ORAL_TABLET | Freq: Once | ORAL | Status: DC
  Filled 2012-09-25: qty 4

## 2012-09-25 MED ORDER — SODIUM CHLORIDE 0.9 % IJ SOLN: 3.0000 mL | INTRAMUSCULAR | Status: DC | PRN

## 2012-09-25 MED ORDER — SODIUM CHLORIDE 0.9 % IJ SOLN: 3.0000 mL | Freq: Two times a day (BID) | INTRAMUSCULAR | Status: DC

## 2012-09-25 MED ORDER — SODIUM CHLORIDE 0.9 % IV SOLN
1.0000 mL/kg/h | INTRAVENOUS | Status: DC
  Administered 2012-09-26 (×2): 1 mL/kg/h via INTRAVENOUS

## 2012-09-25 MED ORDER — CLOPIDOGREL BISULFATE 75 MG PO TABS
75.0000 mg | ORAL_TABLET | Freq: Every day | ORAL | Status: DC
  Administered 2012-09-26 – 2012-09-27 (×2): 75 mg via ORAL
  Filled 2012-09-25 (×3): qty 1

## 2012-09-25 MED ORDER — CLOPIDOGREL BISULFATE 300 MG PO TABS
300.0000 mg | ORAL_TABLET | Freq: Once | ORAL | Status: AC
  Administered 2012-09-25: 300 mg via ORAL
  Filled 2012-09-25: qty 1

## 2012-09-25 MED ORDER — SODIUM CHLORIDE 0.9 % IV SOLN: 250.0000 mL | INTRAVENOUS | Status: DC | PRN

## 2012-09-25 MED ORDER — DIAZEPAM 5 MG PO TABS
5.0000 mg | ORAL_TABLET | ORAL | Status: AC
  Administered 2012-09-26: 5 mg via ORAL
  Filled 2012-09-25: qty 1

## 2012-09-25 NOTE — Progress Notes (Signed)
    Subjective:  Chest pressure has eased off. No dyspnea.  Objective:  Vital Signs in the last 24 hours: Temp:  [97.6 F (36.4 C)-97.9 F (36.6 C)] 97.6 F (36.4 C) (06/26 0504) Pulse Rate:  [57-71] 71 (06/26 0504) Resp:  [15-20] 18 (06/26 0504) BP: (106-149)/(51-85) 138/85 mmHg (06/26 0504) SpO2:  [92 %-96 %] 94 % (06/26 0504) Weight:  [100.699 kg (222 lb)] 100.699 kg (222 lb) (06/25 1708)  Intake/Output from previous day:    Physical Exam: Pt is alert and oriented, obese male in NAD HEENT: normal Neck: JVP - normal Lungs: CTA bilaterally CV: RRR without murmur or gallop Abd: soft, NT, Positive BS, no hepatomegaly Ext: no C/C/E, distal pulses intact and equal Skin: warm/dry no rash  Lab Results:  Recent Labs  09/24/12 1120 09/25/12 0618  WBC 7.0 7.4  HGB 14.9 14.9  PLT 194 186    Recent Labs  09/24/12 1120 09/25/12 0618  NA 141 143  K 3.9 3.7  CL 103 104  CO2 27 29  GLUCOSE 119* 115*  BUN 8 10  CREATININE 0.74 0.74    Recent Labs  09/24/12 1648 09/24/12 2230  TROPONINI <0.30 <0.30    Cardiac Studies: Pending  Tele: Sinus rhythm  Assessment/Plan:  1. Unstable angina pectoris. The patient is chest pain-free. He remains on IV heparin and nitroglycerin. INR is higher than yesterday and is now at 1.93. Will defer cardiac catheterization until tomorrow to make sure that he does not continue to trend upward. Coumadin has only been on hold from yesterday. Since he's had prior CABG, I am going to go ahead and load him with Plavix as I suspect he will be a candidate for PCI. Otherwise continue current therapy.  2. Peripheral arterial disease status post bilateral femoral-popliteal bypass. Appears stable. Palpable pulses in both feet. Hopefully he can be done from the left wrist tomorrow.  3. Hyperlipidemia. He remains on simvastatin.  4. Hypertension. He will continue on a combination of amlodipine, losartan, and metoprolol.  Hipolito Martinezlopez,  M.D. 09/25/2012, 1:36 PM     

## 2012-09-25 NOTE — Progress Notes (Signed)
ANTICOAGULATION CONSULT NOTE - Follow Up Consult  Pharmacy Consult for heparin Indication: chest pain/ACS  Labs:  Recent Labs  09/24/12 1120 09/24/12 1648 09/24/12 2230 09/25/12 0030  HGB 14.9  --   --   --   HCT 43.5  --   --   --   PLT 194  --   --   --   LABPROT 20.0*  --   --   --   INR 1.76*  --   --   --   HEPARINUNFRC  --   --   --  <0.10*  CREATININE 0.74  --   --   --   TROPONINI <0.30 <0.30 <0.30  --     Assessment: 72yo male undetectable on heparin with initial dosing for CP.  Goal of Therapy:  Heparin level 0.3-0.7 units/ml   Plan:  Will increase heparin gtt by 4 units/kg/hr to 1600 units/hr and check level in 6hr.  Vernard Gambles, PharmD, BCPS  09/25/2012,1:25 AM

## 2012-09-25 NOTE — Progress Notes (Signed)
ANTICOAGULATION CONSULT NOTE - Follow Up Consult  Pharmacy Consult for heparin Indication: chest pain/ACS  Labs:  Recent Labs  09/24/12 1120 09/24/12 1648 09/24/12 2230 09/25/12 0030 09/25/12 0618 09/25/12 0815  HGB 14.9  --   --   --  14.9  --   HCT 43.5  --   --   --  44.9  --   PLT 194  --   --   --  186  --   LABPROT 20.0*  --   --   --  21.6*  --   INR 1.76*  --   --   --  1.94*  --   HEPARINUNFRC  --   --   --  <0.10*  --  0.38  CREATININE 0.74  --   --   --  0.74  --   TROPONINI <0.30 <0.30 <0.30  --   --   --     Assessment: 72yo male on heparin for rule out acs. He is on warfarin chronically at home which is currently on hold. INR up overnight to 1.9 so cath is being delayed. Heparin this morning is at goal. No bleeding complications have been noted, cbc stable.  Goal of Therapy:  Heparin level 0.3-0.7 units/ml   Plan:  Continue heparin at 1600 units/hr Daily HL/CBC/INR Follow up plans for cath  Sheppard Coil PharmD., BCPS Clinical Pharmacist Pager [phone] 09/25/2012 9:56 AM

## 2012-09-26 ENCOUNTER — Encounter (HOSPITAL_COMMUNITY)
Admission: EM | Disposition: A | Discharge status: Home / Self Care | Payer: Self-pay | Source: Home / Self Care | Attending: Cardiology

## 2012-09-26 DIAGNOSIS — I2510 Atherosclerotic heart disease of native coronary artery without angina pectoris: Secondary | ICD-10-CM

## 2012-09-26 HISTORY — PX: CARDIAC CATHETERIZATION: SHX172

## 2012-09-26 HISTORY — PX: LEFT HEART CATHETERIZATION WITH CORONARY/GRAFT ANGIOGRAM: SHX5450

## 2012-09-26 LAB — PROTIME-INR
INR: 1.94 — ABNORMAL HIGH (ref 0.00–1.49)
INR: 1.72 — ABNORMAL HIGH (ref 0.00–1.49)
Prothrombin Time: 21.6 seconds — ABNORMAL HIGH (ref 11.6–15.2)

## 2012-09-26 LAB — GLUCOSE, CAPILLARY
Glucose-Capillary: 96 mg/dL (ref 70–99)
Glucose-Capillary: 98 mg/dL (ref 70–99)
Glucose-Capillary: 121 mg/dL — ABNORMAL HIGH (ref 70–99)

## 2012-09-26 LAB — CBC
Platelets: 184 10*3/uL (ref 150–400)
RBC: 4.74 MIL/uL (ref 4.22–5.81)
RDW: 14.2 % (ref 11.5–15.5)
WBC: 7.9 10*3/uL (ref 4.0–10.5)

## 2012-09-26 LAB — HEPARIN LEVEL (UNFRACTIONATED): Heparin Unfractionated: 0.56 IU/mL (ref 0.30–0.70)

## 2012-09-26 SURGERY — LEFT HEART CATHETERIZATION WITH CORONARY/GRAFT ANGIOGRAM
Anesthesia: LOCAL

## 2012-09-26 MED ORDER — LIDOCAINE HCL (PF) 1 % IJ SOLN
INTRAMUSCULAR | Status: AC
  Filled 2012-09-26: qty 30

## 2012-09-26 MED ORDER — VERAPAMIL HCL 2.5 MG/ML IV SOLN
INTRAVENOUS | Status: AC
  Filled 2012-09-26: qty 2

## 2012-09-26 MED ORDER — FENTANYL CITRATE 0.05 MG/ML IJ SOLN
INTRAMUSCULAR | Status: AC
  Filled 2012-09-26: qty 2

## 2012-09-26 MED ORDER — NITROGLYCERIN 0.2 MG/ML ON CALL CATH LAB
INTRAVENOUS | Status: AC
  Filled 2012-09-26: qty 1

## 2012-09-26 MED ORDER — PHYTONADIONE 5 MG PO TABS
2.5000 mg | ORAL_TABLET | Freq: Once | ORAL | Status: AC
  Administered 2012-09-26: 2.5 mg via ORAL
  Filled 2012-09-26: qty 1

## 2012-09-26 MED ORDER — WARFARIN - PHYSICIAN DOSING INPATIENT: Freq: Every day | Status: DC

## 2012-09-26 MED ORDER — HEPARIN SODIUM (PORCINE) 1000 UNIT/ML IJ SOLN
INTRAMUSCULAR | Status: AC
  Filled 2012-09-26: qty 1

## 2012-09-26 MED ORDER — SODIUM CHLORIDE 0.9 % IV SOLN: INTRAVENOUS | Status: AC

## 2012-09-26 MED ORDER — MIDAZOLAM HCL 2 MG/2ML IJ SOLN
INTRAMUSCULAR | Status: AC
  Filled 2012-09-26: qty 2

## 2012-09-26 MED ORDER — HEPARIN (PORCINE) IN NACL 2-0.9 UNIT/ML-% IJ SOLN
INTRAMUSCULAR | Status: AC
  Filled 2012-09-26: qty 1000

## 2012-09-26 MED ORDER — WARFARIN SODIUM 5 MG PO TABS
5.0000 mg | ORAL_TABLET | Freq: Every day | ORAL | Status: DC
  Administered 2012-09-26: 5 mg via ORAL
  Filled 2012-09-26 (×2): qty 1

## 2012-09-26 NOTE — Progress Notes (Signed)
ANTICOAGULATION CONSULT NOTE - Follow Up Consult  Pharmacy Consult for heparin Indication: chest pain/ACS  Labs:  Recent Labs  09/24/12 1120 09/24/12 1648 09/24/12 2230 09/25/12 0030 09/25/12 0618 09/25/12 0815 09/26/12 0450  HGB 14.9  --   --   --  14.9  --  14.2  HCT 43.5  --   --   --  44.9  --  43.2  PLT 194  --   --   --  186  --  184  LABPROT 20.0*  --   --   --  21.6*  --  21.6*  INR 1.76*  --   --   --  1.94*  --  1.94*  HEPARINUNFRC  --   --   --  <0.10*  --  0.38 0.56  CREATININE 0.74  --   --   --  0.74  --   --   TROPONINI <0.30 <0.30 <0.30  --   --   --   --     Assessment: 72yo male on heparin for rule out acs. He is on warfarin chronically at home which is currently on hold. INR continues to be 1.9 cath delayed yesterday, vitamin k given this morning and will recheck INR this afternoon, if down plan is for cath. Heparin this morning continues to be at goal. No bleeding complications have been noted, cbc stable.  Goal of Therapy:  Heparin level 0.3-0.7 units/ml   Plan:  Continue heparin at 1600 units/hr 1300 INR Follow up plans for cath   Sheppard Coil PharmD., BCPS Clinical Pharmacist Pager [phone] 09/26/2012 9:57 AM

## 2012-09-26 NOTE — CV Procedure (Signed)
Cardiac Catheterization Operative Report  Troy [name] [id number] 6/27/20144:14 PM Colette Ribas, MD  Procedure Performed:  1. Left Heart Catheterization 2. Selective Coronary Angiography 3. Left ventricular angiogram 4. SVG angiography 5. LIMA graft angiography  Operator: Verne Carrow, MD  Arterial access site:  Right radial artery.   Indication: 72 yo male with history of CAD s/p CABG admitted with chest pain. Negative cardiac markers.                                   Procedure Details: The risks, benefits, complications, treatment options, and expected outcomes were discussed with the patient. The patient and/or family concurred with the proposed plan, giving informed consent. The patient was brought to the cath lab after IV hydration was begun and oral premedication was given. The patient was further sedated with Versed and Fentanyl. The left wrist was assessed with an Allens test which was positive. The left wrist was prepped and draped in a sterile fashion. 1% lidocaine was used for local anesthesia. Using the modified Seldinger access technique, a 5/6 hybrid sheath was placed in the left radial artery. 3 mg Verapamil was given through the sheath. 5000 units IV heparin was given. We chose to go from the left radial artery given his INR of 1.7. I could not use the right radial artery with his prior IMA bypass graft. I easily engaged the native vessels with standard catheters. The SVG to the Diagonal was engaged with a AL-1 catheter. The IMA was non-selectively visualized with a IMA catheter. A pigtail catheter was used to perform a left ventricular angiogram. The sheath was removed from the left radial artery and a Terumo hemostasis band was applied at the arteriotomy site on the right wrist.    There were no immediate complications. The patient was taken to the recovery area in stable condition.   Hemodynamic Findings: Central aortic pressure: 129/59 Left  ventricular pressure: 136/10/16  Angiographic Findings:  Left main: No obstructive disease.   Left Anterior Descending Artery: Large caliber vessel that courses to the apex. There is diffuse 40% stenosis in the mid and distal LAD. There is a moderate caliber diagonal branch with mild plaque disease. There is competitive flow seen in the mid LAD from the graft flow into the diagonal branch.   Circumflex Artery: Large caliber vessel with large obtuse marginal branch. Mild plaque in the OM branch.   Right Coronary Artery: Moderate caliber vessel with diffuse mild plaque in the proximal and mid vessel. The distal vessel is occluded. The distal vessel fills from left to right collaterals.   Left Ventricular Angiogram: LVEF=60-65%  Graft Anatomy:  SVG to PDA is occluded SVG to OM is occluded SVG to Diagonal is patent and fills the Diagonal and LAD LIMA to LAD is atretic.   Impression: 1. Single vessel CAD s/p 4V CABG with 1/4 patent bypass grafts 2. The distal RCA is occluded but fills briskly from left to right collaterals. (SVG is occluded to the distal RCA)  3. The Circumflex has no flow limiting disease (SVG is occluded to OM) 4. The LAD has no flow limiting disease and fills from antegrade flow and competitively from the SVG to Diagonal 5. Preserved LV systolic function  Recommendations: Continue medical therapy. Consider addition of long acting nitrates.        Complications:  None. The patient tolerated the procedure well.

## 2012-09-26 NOTE — Progress Notes (Signed)
Patient evaluated for long-term disease management services with [facility] as a benefit of his [employer]. Reports he manages pretty well at home. However, he would like the further follow up. Consents signed. Patient will receive a post discharge transition of care call and will be evaluated for monthly home visits for assessments and for education. Confirmed best contact information.  Raiford Noble, RN,BSN, [facility] Liaison, [phone]

## 2012-09-26 NOTE — Progress Notes (Signed)
Patient tolerated TR Band air release.  TR band removed at 1900, with left radial site level 0, no bruising or hematoma.  2x2 and tegaderm applied. Reverse Allen's test positive for good blood flow.  Will continue to monitor. Troy [name]

## 2012-09-26 NOTE — H&P (View-Only) (Signed)
    Subjective:  Chest pressure has eased off. No dyspnea.  Objective:  Vital Signs in the last 24 hours: Temp:  [97.6 F (36.4 C)-97.9 F (36.6 C)] 97.6 F (36.4 C) (06/26 0504) Pulse Rate:  [57-71] 71 (06/26 0504) Resp:  [15-20] 18 (06/26 0504) BP: (106-149)/(51-85) 138/85 mmHg (06/26 0504) SpO2:  [92 %-96 %] 94 % (06/26 0504) Weight:  [100.699 kg (222 lb)] 100.699 kg (222 lb) (06/25 1708)  Intake/Output from previous day:    Physical Exam: Pt is alert and oriented, obese male in NAD HEENT: normal Neck: JVP - normal Lungs: CTA bilaterally CV: RRR without murmur or gallop Abd: soft, NT, Positive BS, no hepatomegaly Ext: no C/C/E, distal pulses intact and equal Skin: warm/dry no rash  Lab Results:  Recent Labs  09/24/12 1120 09/25/12 0618  WBC 7.0 7.4  HGB 14.9 14.9  PLT 194 186    Recent Labs  09/24/12 1120 09/25/12 0618  NA 141 143  K 3.9 3.7  CL 103 104  CO2 27 29  GLUCOSE 119* 115*  BUN 8 10  CREATININE 0.74 0.74    Recent Labs  09/24/12 1648 09/24/12 2230  TROPONINI <0.30 <0.30    Cardiac Studies: Pending  Tele: Sinus rhythm  Assessment/Plan:  1. Unstable angina pectoris. The patient is chest pain-free. He remains on IV heparin and nitroglycerin. INR is higher than yesterday and is now at 1.93. Will defer cardiac catheterization until tomorrow to make sure that he does not continue to trend upward. Coumadin has only been on hold from yesterday. Since he's had prior CABG, I am going to go ahead and load him with Plavix as I suspect he will be a candidate for PCI. Otherwise continue current therapy.  2. Peripheral arterial disease status post bilateral femoral-popliteal bypass. Appears stable. Palpable pulses in both feet. Hopefully he can be done from the left wrist tomorrow.  3. Hyperlipidemia. He remains on simvastatin.  4. Hypertension. He will continue on a combination of amlodipine, losartan, and metoprolol.  Tonny Bollman,  M.D. 09/25/2012, 1:36 PM

## 2012-09-26 NOTE — Interval H&P Note (Signed)
History and Physical Interval Note:  09/26/2012 3:02 PM  Troy [name]  has presented today for cardiac cath with the diagnosis of Chest pain  The various methods of treatment have been discussed with the patient and family. After consideration of risks, benefits and other options for treatment, the patient has consented to  Procedure(s): LEFT HEART CATHETERIZATION WITH CORONARY/GRAFT ANGIOGRAM (N/A) as a surgical intervention .  The patient's history has been reviewed, patient examined, no change in status, stable for surgery.  I have reviewed the patient's chart and labs.  Questions were answered to the patient's satisfaction.  Cath Lab Visit (complete for each Cath Lab visit)  Clinical Evaluation Leading to the Procedure:   ACS: no  Non-ACS:    Anginal Classification: CCS II  Anti-ischemic medical therapy: Maximal Therapy (2 or more classes of medications)  Non-Invasive Test Results: No non-invasive testing performed  Prior CABG: Previous CABG           [name]

## 2012-09-27 ENCOUNTER — Encounter (HOSPITAL_COMMUNITY): Payer: Self-pay | Admitting: Physician Assistant

## 2012-09-27 DIAGNOSIS — I10 Essential (primary) hypertension: Secondary | ICD-10-CM

## 2012-09-27 DIAGNOSIS — E119 Type 2 diabetes mellitus without complications: Secondary | ICD-10-CM

## 2012-09-27 DIAGNOSIS — I2510 Atherosclerotic heart disease of native coronary artery without angina pectoris: Secondary | ICD-10-CM

## 2012-09-27 LAB — CBC
MCV: 91.5 fL (ref 78.0–100.0)
Platelets: 179 10*3/uL (ref 150–400)
RBC: 4.58 MIL/uL (ref 4.22–5.81)
WBC: 7.1 10*3/uL (ref 4.0–10.5)

## 2012-09-27 LAB — GLUCOSE, CAPILLARY: Glucose-Capillary: 105 mg/dL — ABNORMAL HIGH (ref 70–99)

## 2012-09-27 MED ORDER — METOPROLOL TARTRATE 12.5 MG HALF TABLET: 12.5000 mg | ORAL_TABLET | Freq: Two times a day (BID) | ORAL | Status: DC

## 2012-09-27 MED ORDER — ISOSORBIDE MONONITRATE ER 30 MG PO TB24: 30.0000 mg | ORAL_TABLET | Freq: Every day | ORAL | Status: DC

## 2012-09-27 MED ORDER — NITROGLYCERIN 0.4 MG SL SUBL: 0.4000 mg | SUBLINGUAL_TABLET | SUBLINGUAL | Status: DC | PRN

## 2012-09-27 MED ORDER — ASPIRIN 81 MG PO TBEC: 81.0000 mg | DELAYED_RELEASE_TABLET | Freq: Every day | ORAL | Status: DC

## 2012-09-27 MED ORDER — METFORMIN HCL 1000 MG PO TABS: 1000.0000 mg | ORAL_TABLET | Freq: Two times a day (BID) | ORAL | Status: DC

## 2012-09-27 MED ORDER — WARFARIN SODIUM 2 MG PO TABS: 2.0000 mg | ORAL_TABLET | Freq: Every day | ORAL | Status: DC

## 2012-09-27 NOTE — Progress Notes (Signed)
[id number] Cardiac Rehab Pt states that he has walked in hall without problems.I questioned him about cp and he states that it feels the same as it has been feeling. " I still have the pressure that I have had since the heart surgery" He said that walking in hall made no difference in the pressure. It is a constant pressure. Completed discharge education with pt and family. Discussed angina, proper use of sl NTG,calling 911, exercise and diabetic diet. Pt voices understanding. He declines Outpt. CRP Melina Copa RN

## 2012-09-27 NOTE — Discharge Summary (Signed)
Discharge Summary   Patient ID: Troy [name],  MRN: [id number], DOB/AGE: March 03, 1941 72 y.o.  Admit date: 09/24/2012 Discharge date: 09/27/2012  Primary Physician: Colette Ribas, MD Primary Cardiologist: Lovena Neighbours, MD  Discharge Diagnoses Principal Problem:   Unstable angina Active Problems:   Coronary artery disease   CAD (coronary artery disease) of artery bypass graft   CORONARY ARTERY BYPASS GRAFT, HX OF   Carotid artery disease   Atherosclerosis of native arteries of the extremities with intermittent claudication   DYSLIPIDEMIA   Renal artery stenosis   S/P femoropopliteal bypass surgery   Type 2 diabetes mellitus   Hypertension   Allergies Allergies  Allergen Reactions  . Niacin Itching    Diagnostic Studies/Procedures  PA/LATERAL CHEST X-RAY - 09/24/12  IMPRESSION:  No active lung disease. Stable mild cardiomegaly.   CARDIAC CATHETERIZATION - 09/26/12  Procedure Performed:  1. Left Heart Catheterization 2. Selective Coronary Angiography 3. Left ventricular angiogram 4. SVG angiography 5. LIMA graft angiography  Hemodynamic Findings:  Central aortic pressure: 129/59  Left ventricular pressure: 136/10/16  Angiographic Findings:  Left main: No obstructive disease.  Left Anterior Descending Artery: Large caliber vessel that courses to the apex. There is diffuse 40% stenosis in the mid and distal LAD. There is a moderate caliber diagonal branch with mild plaque disease. There is competitive flow seen in the mid LAD from the graft flow into the diagonal branch.  Circumflex Artery: Large caliber vessel with large obtuse marginal branch. Mild plaque in the OM branch.  Right Coronary Artery: Moderate caliber vessel with diffuse mild plaque in the proximal and mid vessel. The distal vessel is occluded. The distal vessel fills from left to right collaterals.  Left Ventricular Angiogram: LVEF=60-65%  Graft Anatomy:  SVG to PDA is occluded  SVG to OM is  occluded  SVG to Diagonal is patent and fills the Diagonal and LAD  LIMA to LAD is atretic.  Impression:  1. Single vessel CAD s/p 4V CABG with 1/4 patent bypass grafts  2. The distal RCA is occluded but fills briskly from left to right collaterals. (SVG is occluded to the distal RCA)  3. The Circumflex has no flow limiting disease (SVG is occluded to OM)  4. The LAD has no flow limiting disease and fills from antegrade flow and competitively from the SVG to Diagonal  5. Preserved LV systolic function  History of Present Illness Troy [name] is a 72 y.o. male who was admitted to [facility] on 09/24/12 with the above problem list.   He is a pleasant 72 year old Caucasian male with past medical history significant for CAD status post CABG in 2010 by Dr. Hessie Diener, extensive peripheral vascular disease status post bilateral femoral-popliteal bypass grafting, chronic Coumadin anticoagulation (secondary to PVD), nonobstructive carotid artery disease, renal artery stenosis, type 2 diabetes mellitus, dyslipidemia and hypertension. He presented to the [facility] emergency department on 09/24/12 complaining of progressively worsening chest pain over the prior 24 hour period. At baseline, he endorsed chronic angina since prior bypass grafting; however, he began experiencing increased left corneal chest tightness associated with indigestion and nausea. Functional status is limited by PVD at baseline. He denied radiation or associated shortness of breath.  In the ED, EKG and telemetry revealed PVCs and otherwise no evidence of acute ischemia. Initial troponin I returned within normal limits. Pro BNP was very mildly elevated at 146. INR subtherapeutic at 1.76. Chest x-ray as above indicated mild cardiomegaly and otherwise no acute cardiopulmonary  disease. Given his cardiac history, significant back risk factors and vasculopathies and history concerning for unstable angina, the decision was made to admit the  patient proceed with diagnostic cardiac catheterization.   Hospital Course   The patient's INR was found to be subtherapeutic in the emergency department. He was heparinized, Coumadin was held and INR was allowed trend down further prior to pursuing cardiac cath. He was started on nitroglycerin drip. Hemoglobin A1c returned at 6.2%. TSH and free T4 were within normal limits. 2 subsequent troponin I readings returned within normal limits. He experienced no further chest discomfort while inpatient. Dr. Excell Seltzer rounded on the patient the following day and given a suspicion for need to undergo PCI, he was loaded with Plavix. INR actually trended up to 1.92 despite Coumadin withdrawal. As a result, cardiac catheterization was postponed an additional day. He was seen by a [facility] Care Manager and arrangements were made for post discharge care.   On 09/26/12, his INR was found to be 1.72 and at an acceptable level to proceed with cardiac catheterization. The patient was informed, consented and prepped for cardiac cath which was accessed via the right radial artery given his diffuse peripheral vascular disease. As above, this indicated 1/4 patent bypass (occluded SVG-PDA, SVG-OM, LIMA-LAD), SVG-diagonal patent and fills the diagonal and LAD, distal RCA occlusion with left to right collateralization, patent circumflex, LAD with no flow-limiting disease and antegrade flow competitively from SVG-diagonal; EF 60-65%. The recommendation was made to continue medical therapy and consider addition of long-acting nitrates. The patient tolerated the procedure well without complications.  The patient remained asymptomatic overnight. He was evaluated by Dr. Diona Browner today begin the patient stable for discharge. Plavix was discontinued in favor of low-dose daily aspirin. He will be started on Imdur 30mg  daily and given an as needed prescription of SL NTG. He will be discharged on a a modified dose of Coumadin to return to a  therapeutic range. He will be discharged on the medication regimen outlined below. He has a previously scheduled appointment with Dr. Myrtis Ser in approximately one month's time. This will be resumed. He has been advised to followup with his primary care provider's office next week for Coumadin management and INR check. This information, including post cath instructions and activity restrictions, has been clearly outlined in the discharge AVS.  Discharge Vitals:  Blood pressure 110/70, pulse 57, temperature 97.5 F (36.4 C), temperature source Oral, resp. rate 18, height 5\' 10"  (1.778 m), weight 102.558 kg (226 lb 1.6 oz), SpO2 92.00%.   Labs: Recent Labs     09/26/12  0450  09/27/12  0540  WBC  7.9  7.1  HGB  14.2  13.8  HCT  43.2  41.9  MCV  91.1  91.5  PLT  184  179    Recent Labs Lab 09/24/12 1120 09/25/12 0618  NA 141 143  K 3.9 3.7  CL 103 104  CO2 27 29  BUN 8 10  CREATININE 0.74 0.74  CALCIUM 8.8 8.9  GLUCOSE 119* 115*   Recent Labs     09/24/12  1648  HGBA1C  6.2*   Recent Labs     09/24/12  1648  09/24/12  2230  TROPONINI  <0.30  <0.30   Recent Labs     09/25/12  0618  CHOL  129  HDL  31*  LDLCALC  68  TRIG  152*  CHOLHDL  4.2    Recent Labs  09/24/12 1648  TSH 0.531  Disposition:  Discharge Orders   Future Appointments Provider Department Dept Phone   10/29/2012 11:30 AM Luis Abed, MD [facility] Main Office [place]) [phone]   07/28/2013 8:30 AM [facility] [phone]   Eat a light meal the night before the exam but please avoid gaseous foods.   Nothing to eat or drink for at least 8 hours prior to the exam. No gum chewing or smoking the morning of the exam. Please take your morning medications with small sips of water, especially blood pressure medication. If you have several vascular lab exams and will see physician, please bring a snack with you.   07/28/2013 9:00 AM [facility]Lab Lab 4  [facility] [phone]   07/28/2013 9:30 AM [facility] Lab 4 [facility] [place] [phone]   07/28/2013 10:30 AM Larina Earthly, MD [facility] [phone]   Future Orders Complete By Expires     Diet - low sodium heart healthy  As directed     Increase activity slowly  As directed           Follow-up Information   Follow up with Willa Rough, MD On 09/29/2012. (At 11:30 AM as previously scheduled. )    Contact information:   [address] [place] [address] [phone]       Schedule an appointment as soon as possible for a visit with Colette Ribas, MD. (Next week for Coumadin check and post-hospital follow-up.)    Contact information:   [address] [place] [address] [phone]       Discharge Medications:    Medication List    STOP taking these medications       isosorbide dinitrate 40 MG CR tablet  Commonly known as:  ISOCHRON      TAKE these medications       amLODipine 10 MG tablet  Commonly known as:  NORVASC  Take 10 mg by mouth daily.     aspirin 81 MG EC tablet  Take 1 tablet (81 mg total) by mouth daily.     furosemide 40 MG tablet  Commonly known as:  LASIX  Take 40 mg by mouth daily.     HYDROcodone-acetaminophen 5-500 MG per tablet  Commonly known as:  VICODIN  Take 1 tablet by mouth every 6 (six) hours as needed for pain.     isosorbide mononitrate 30 MG 24 hr tablet  Commonly known as:  IMDUR  Take 1 tablet (30 mg total) by mouth daily.     losartan 100 MG tablet  Commonly known as:  COZAAR  Take 100 mg by mouth daily.     metFORMIN 1000 MG tablet  Commonly known as:  GLUCOPHAGE  Take 1 tablet (1,000 mg total) by mouth 2 (two) times daily with a meal.  Start taking on:  09/28/2012     metoprolol tartrate 12.5 mg Tabs  Commonly known as:  LOPRESSOR  Take 0.5 tablets (12.5 mg total) by mouth 2  (two) times daily.     nitroGLYCERIN 0.4 MG SL tablet  Commonly known as:  NITROSTAT  Place 1 tablet (0.4 mg total) under the tongue every 5 (five) minutes x 3 doses as needed for chest pain.     simvastatin 20 MG tablet  Commonly known as:  ZOCOR  Take 1 tablet (20 mg total) by mouth at bedtime.     tetrahydrozoline-zinc  0.05-0.25 % ophthalmic solution  Commonly known as:  VISINE-AC  2 drops 3 (three) times daily as needed.     warfarin 2 MG tablet  Commonly known as:  COUMADIN  Take 1 tablet (2 mg total) by mouth daily. Take 2 tablets (4 mg total) today (09/27/12), then 1 tablet (2mg  total) thereafter as directed by Coumadin clinic.       Outstanding Labs/Studies: INR next week at PCP's office  Duration of Discharge Encounter: Greater than 30 minutes including physician time.  Signed, R. Hurman Horn, PA-C 09/27/2012, 2:24 PM

## 2012-09-27 NOTE — Progress Notes (Signed)
Primary cardiologist: Dr. Zackery Barefoot  Subjective:   Patient up in chair, no active chest pain symptoms. Has ambulated in the hall this morning.   Objective:   Temp:  [97.5 F (36.4 C)-98.3 F (36.8 C)] 97.5 F (36.4 C) (06/28 1610) Pulse Rate:  [50-59] 56 (06/28 0613) Resp:  [18] 18 (06/27 1400) BP: (111-143)/(58-83) 136/83 mmHg (06/28 0613) SpO2:  [92 %-98 %] 92 % (06/28 0613) Last BM Date: 09/23/12  Filed Weights   09/24/12 1708 09/26/12 0538  Weight: 222 lb (100.699 kg) 226 lb 1.6 oz (102.558 kg)    Intake/Output Summary (Last 24 hours) at 09/27/12 1040 Last data filed at 09/26/12 1700  Gross per 24 hour  Intake    480 ml  Output      0 ml  Net    480 ml   Telemetry: Sinus rhythm.  Exam:  General: Comfortable at rest.  Lungs: Clear to auscultation, nonlabored.  Cardiac: RRR, no gallop.  Abdomen: NABS.  Extremities: Stable radial artery access site.  Lab Results:  Basic Metabolic Panel:  Recent Labs Lab 09/24/12 1120 09/24/12 1648 09/25/12 0618  NA 141  --  143  K 3.9  --  3.7  CL 103  --  104  CO2 27  --  29  GLUCOSE 119*  --  115*  BUN 8  --  10  CREATININE 0.74  --  0.74  CALCIUM 8.8  --  8.9  MG  --  1.6  --     CBC:  Recent Labs Lab 09/25/12 0618 09/26/12 0450 09/27/12 0540  WBC 7.4 7.9 7.1  HGB 14.9 14.2 13.8  HCT 44.9 43.2 41.9  MCV 90.7 91.1 91.5  PLT 186 184 179    Cardiac Enzymes:  Recent Labs Lab 09/24/12 1120 09/24/12 1648 09/24/12 2230  TROPONINI <0.30 <0.30 <0.30    BNP:  Recent Labs  09/24/12 1120  PROBNP 146.0*    Coagulation:  Recent Labs Lab 09/25/12 0618 09/26/12 0450 09/26/12 1300  INR 1.94* 1.94* 1.72*     Medications:   Scheduled Medications: . amLODipine  10 mg Oral Daily  . aspirin EC  81 mg Oral Daily  . clopidogrel  75 mg Oral Q breakfast  . furosemide  40 mg Oral Daily  . insulin aspart  0-15 Units Subcutaneous TID WC  . losartan  100 mg Oral Daily  . metoprolol  tartrate  12.5 mg Oral BID  . simvastatin  20 mg Oral QHS  . sodium chloride  3 mL Intravenous Q12H  . warfarin  5 mg Oral q1800  . Warfarin - Physician Dosing Inpatient   Does not apply q1800      PRN Medications:  sodium chloride, acetaminophen, ALPRAZolam, HYDROcodone-acetaminophen, naphazoline, nitroGLYCERIN, ondansetron (ZOFRAN) IV, sodium chloride   Assessment:   1. Accelerating angina, cardiac markers normal. Cardiac catheterization on 6/27 demonstrates essentially single vessel obstructive disease with occluded distal RCA, brisk left or right collaterals. Grafts to the  circumflex and LAD system are without significant flow-limiting lesions. Grafts to the distal RCA and OM were occluded, LIMA to LAD is atretic. SVG to diagonal patent. Medical therapy recommended.  2. Peripheral arterial disease status post bilateral femoral-popliteal bypass. He has been on Coumadin chronically for this, followed by Dr. Phillips Odor in [place].  3. Hyperlipidemia, on statin.  4. Hypertension, on amlodipine, losartan, metoprolol.   Plan/Discussion:    Patient ready for discharge home today. Continue current medical regimen, resuming Coumadin - should have followup  INR with Dr. Phillips Odor in the next 4 or 5 days. Would also add Imdur 30 mg daily to his current regimen for additional antianginal effect, make sure that he has PRN  nitroglycerin as well. He states that he has an office followup visit with Dr. Myrtis Ser scheduled for July 30th. Can likely keep this visit, unless symptoms worsen in the interim.   Jonelle Sidle, M.D., F.A.C.C.

## 2012-09-28 NOTE — Discharge Summary (Signed)
Please see my rounding note. 

## 2012-10-28 ENCOUNTER — Encounter: Payer: Self-pay | Admitting: Cardiology

## 2012-10-28 DIAGNOSIS — I2510 Atherosclerotic heart disease of native coronary artery without angina pectoris: Secondary | ICD-10-CM | POA: Insufficient documentation

## 2012-10-29 ENCOUNTER — Other Ambulatory Visit: Payer: Self-pay

## 2012-10-29 ENCOUNTER — Encounter: Payer: Self-pay | Admitting: Cardiology

## 2012-10-29 ENCOUNTER — Ambulatory Visit (INDEPENDENT_AMBULATORY_CARE_PROVIDER_SITE_OTHER): Payer: Medicare Other | Admitting: Cardiology

## 2012-10-29 VITALS — BP 122/56 | HR 51 | Ht 70.0 in | Wt 227.4 lb

## 2012-10-29 DIAGNOSIS — I10 Essential (primary) hypertension: Secondary | ICD-10-CM

## 2012-10-29 DIAGNOSIS — I2510 Atherosclerotic heart disease of native coronary artery without angina pectoris: Secondary | ICD-10-CM

## 2012-10-29 MED ORDER — METOPROLOL TARTRATE 12.5 MG HALF TABLET: 12.5000 mg | ORAL_TABLET | Freq: Two times a day (BID) | ORAL | Status: DC

## 2012-10-29 MED ORDER — ISOSORBIDE MONONITRATE ER 30 MG PO TB24: 30.0000 mg | ORAL_TABLET | Freq: Every day | ORAL | Status: DC

## 2012-10-29 NOTE — Assessment & Plan Note (Signed)
Blood pressure is controlled. No change in therapy. 

## 2012-10-29 NOTE — Patient Instructions (Signed)
**Note De-identified Dotti Busey Obfuscation** Your physician recommends that you continue on your current medications as directed. Please refer to the Current Medication list given to you today.  Your physician wants you to follow-up in: 6 months. You will receive a reminder letter in the mail two months in advance. If you don't receive a letter, please call our office to schedule the follow-up appointment.  

## 2012-10-29 NOTE — Progress Notes (Signed)
HPI  Patient is seen to followup his coronary artery disease. He has known disease post CABG in the past. He has had chronic vague left upper chest discomfort for many years. Recently this felt worse and he was hospitalized. Catheterization was done. He has only one graft patent but he has good flow to his myocardium. There was no need for stenting. He was placed on a small dose of nitrates. He is tolerating this without difficulty. He still has his old vague left anterior chest discomfort.  Allergies  Allergen Reactions  . Niacin Itching    Current Outpatient Prescriptions  Medication Sig Dispense Refill  . amLODipine (NORVASC) 10 MG tablet Take 10 mg by mouth daily.        [name] aspirin EC 81 MG EC tablet Take 1 tablet (81 mg total) by mouth daily.  30 tablet  3  . Chlorpheniramine Maleate (ALLERGY RELIEF PO) Take by mouth.      . furosemide (LASIX) 40 MG tablet Take 40 mg by mouth daily.        [name] HYDROcodone-acetaminophen (VICODIN) 5-500 MG per tablet Take 1 tablet by mouth every 6 (six) hours as needed for pain.       . isosorbide mononitrate (IMDUR) 30 MG 24 hr tablet Take 1 tablet (30 mg total) by mouth daily.  30 tablet  3  . losartan (COZAAR) 100 MG tablet Take 100 mg by mouth daily.        . metFORMIN (GLUCOPHAGE) 1000 MG tablet Take 1 tablet (1,000 mg total) by mouth 2 (two) times daily with a meal.      . metoprolol tartrate (LOPRESSOR) 12.5 mg TABS Take 0.5 tablets (12.5 mg total) by mouth 2 (two) times daily.  30 tablet  3  . nitroGLYCERIN (NITROSTAT) 0.4 MG SL tablet Place 1 tablet (0.4 mg total) under the tongue every 5 (five) minutes x 3 doses as needed for chest pain.  25 tablet  3  . simvastatin (ZOCOR) 20 MG tablet Take 1 tablet (20 mg total) by mouth at bedtime.  90 tablet  1  . tetrahydrozoline-zinc (VISINE-AC) 0.05-0.25 % ophthalmic solution 2 drops 3 (three) times daily as needed.      . warfarin (COUMADIN) 2 MG tablet Take 1 tablet (2 mg total) by mouth daily. Take 2  tablets (4 mg total) today (09/27/12), then 1 tablet (2mg  total) thereafter as directed by Coumadin clinic.       No current facility-administered medications for this visit.    History   Social History  . Marital Status: Married    Spouse Name: N/A    Number of Children: N/A  . Years of Education: N/A   Occupational History  . Not on file.   Social History Main Topics  . Smoking status: Former Smoker    Quit date: 04/02/1998  . Smokeless tobacco: Never Used  . Alcohol Use: 0.0 oz/week    0-2 drink(s) per week     Comment: OCCASIONAL  . Drug Use: No  . Sexually Active: Not on file   Other Topics Concern  . Not on file   Social History Narrative  . No narrative on file    Family History  Problem Relation Age of Onset  . Coronary artery disease      family hx of  . Deep vein thrombosis Father   . Cancer Sister     lung  . Diabetes Sister   . Heart disease Sister   . Hyperlipidemia Sister   .  Hypertension Sister   . Cancer Brother     "crab cancer"  . Heart disease Brother   . Heart attack Brother   . Cancer Sister     lung  . Cancer Sister     breast  . Hypertension Mother     Past Medical History  Diagnosis Date  . Edema   . Hypertension   . Renal artery stenosis     50-70%  . Chest pain     precordial. mild chronic .[name]... nonischemic  . Coronary artery disease     a. Nuclear, January, 2008, no ischemia b. Cath 08/2012- 1/4 patent grafts, RCA CTO, no flow-limiting disease, medically managed  . Dyslipidemia   . Hx of CABG     2000  . S/P femoropopliteal bypass surgery     Dr. Arbie Cookey  . Ejection fraction     Normal LV, nuclear, 2008, ( no echo data as July 31, 2010)  . Sinus bradycardia     Asymptomatic  . Carotid artery disease     Doppler 08/10/2010,  no change, RICA 0-39%, LICA 50-69%  . Neck pain     November, 2012  . Dizziness     November, 2012  . Myocardial infarction 1999  . Diabetes mellitus without complication   . Cancer      Prostate  . GERD (gastroesophageal reflux disease)     TAKES TUMS & ROLAIDS AS NEEDED  . CAD (coronary artery disease)     Past Surgical History  Procedure Laterality Date  . Coronary artery bypass graft  2000  . Wrist surgery      cyst removal  . Pr vein bypass graft,aorto-fem-pop  07/19/1999    right  . Pr vein bypass graft,aorto-fem-pop  05/02/2006    left  . Cardiac catheterization  09/26/2012    1/4 patent bypass (occluded SVG-PDA, SVG-OM, LIMA-LAD), SVG-diagonal patent and fills the diagonal and LAD, distal RCA occlusion with left to right collateralization, patent circumflex, LAD with no flow-limiting disease and antegrade flow competitively from SVG-diagonal; EF 60-65%    Patient Active Problem List   Diagnosis Date Noted  . CAD (coronary artery disease)   . Type 2 diabetes mellitus 09/27/2012  . Hypertension 09/27/2012  . Unstable angina 09/24/2012  . Atherosclerosis of native arteries of the extremities with intermittent claudication 02/12/2012  . Limb swelling 02/12/2012  . 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 Neck pain   . Dizziness   . Carotid artery disease   . Renal artery stenosis   . S/P femoropopliteal bypass surgery   . Ejection fraction   . Sinus bradycardia   . DYSLIPIDEMIA 01/04/2010  . EDEMA 07/04/2008  . CHEST PAIN, PRECORDIAL 07/04/2008  . CORONARY ARTERY BYPASS GRAFT, HX OF 07/04/2008    ROS   Patient denies fever, chills, headache, sweats, rash, change in vision, change in hearing, cough, nausea vomiting, urinary symptoms. All other systems are reviewed and are negative.  PHYSICAL EXAM  Patient is here with his wife. He is oriented to person time and place. Affect is normal. There is no jugulovenous distention. Lungs are clear. Respiratory effort is nonlabored. Cardiac exam reveals S1 and S2. There no clicks or significant murmurs. Abdomen is soft. There is no peripheral edema.  Filed Vitals:   10/29/12 1135  BP:  122/56  Pulse: 51  Height: 5\' 10"  (1.778 m)  Weight: 227 lb 6.4 oz (103.148 kg)  SpO2: 96%     ASSESSMENT & PLAN

## 2012-10-29 NOTE — Assessment & Plan Note (Signed)
Coronary disease is stable. The specifics of his cath are outlined in the problem list. No further workup

## 2012-11-03 ENCOUNTER — Other Ambulatory Visit: Payer: Self-pay | Admitting: *Deleted

## 2012-11-03 MED ORDER — METOPROLOL TARTRATE 25 MG PO TABS: 12.5000 mg | ORAL_TABLET | Freq: Two times a day (BID) | ORAL | Status: DC

## 2012-11-26 ENCOUNTER — Encounter (INDEPENDENT_AMBULATORY_CARE_PROVIDER_SITE_OTHER): Payer: Medicare Other

## 2012-11-26 DIAGNOSIS — I6529 Occlusion and stenosis of unspecified carotid artery: Secondary | ICD-10-CM

## 2012-12-02 ENCOUNTER — Encounter: Payer: Self-pay | Admitting: Cardiology

## 2013-01-14 ENCOUNTER — Ambulatory Visit (HOSPITAL_COMMUNITY)
Admission: RE | Admit: 2013-01-14 | Discharge: 2013-01-14 | Disposition: A | Discharge status: Home / Self Care | Payer: Medicare Other | Source: Ambulatory Visit | Attending: Family Medicine | Admitting: Family Medicine

## 2013-01-14 ENCOUNTER — Other Ambulatory Visit (HOSPITAL_COMMUNITY): Payer: Self-pay | Admitting: Family Medicine

## 2013-01-14 DIAGNOSIS — M25571 Pain in right ankle and joints of right foot: Secondary | ICD-10-CM

## 2013-01-14 DIAGNOSIS — M25579 Pain in unspecified ankle and joints of unspecified foot: Secondary | ICD-10-CM | POA: Insufficient documentation

## 2013-02-23 ENCOUNTER — Other Ambulatory Visit: Payer: Self-pay

## 2013-02-23 MED ORDER — SIMVASTATIN 20 MG PO TABS: 20.0000 mg | ORAL_TABLET | Freq: Every day | ORAL | Status: DC

## 2013-02-23 MED ORDER — ISOSORBIDE MONONITRATE ER 30 MG PO TB24: 30.0000 mg | ORAL_TABLET | Freq: Every day | ORAL | Status: DC

## 2013-03-31 ENCOUNTER — Other Ambulatory Visit: Payer: Self-pay

## 2013-03-31 MED ORDER — METOPROLOL TARTRATE 25 MG PO TABS: 12.5000 mg | ORAL_TABLET | Freq: Two times a day (BID) | ORAL | Status: DC

## 2013-05-04 ENCOUNTER — Encounter: Payer: Self-pay | Admitting: Cardiology

## 2013-05-04 ENCOUNTER — Ambulatory Visit (INDEPENDENT_AMBULATORY_CARE_PROVIDER_SITE_OTHER): Payer: Medicare HMO | Admitting: Cardiology

## 2013-05-04 ENCOUNTER — Encounter (INDEPENDENT_AMBULATORY_CARE_PROVIDER_SITE_OTHER): Payer: Self-pay

## 2013-05-04 VITALS — BP 110/60 | HR 46 | Ht 70.0 in | Wt 218.0 lb

## 2013-05-04 DIAGNOSIS — I2510 Atherosclerotic heart disease of native coronary artery without angina pectoris: Secondary | ICD-10-CM

## 2013-05-04 DIAGNOSIS — I739 Peripheral vascular disease, unspecified: Secondary | ICD-10-CM

## 2013-05-04 DIAGNOSIS — E785 Hyperlipidemia, unspecified: Secondary | ICD-10-CM

## 2013-05-04 DIAGNOSIS — I779 Disorder of arteries and arterioles, unspecified: Secondary | ICD-10-CM

## 2013-05-04 MED ORDER — ATORVASTATIN CALCIUM 40 MG PO TABS: 40.0000 mg | ORAL_TABLET | Freq: Every day | ORAL | Status: DC

## 2013-05-04 NOTE — Assessment & Plan Note (Signed)
He has significant carotid disease. Is being followed carefully.

## 2013-05-04 NOTE — Patient Instructions (Signed)
Your physician has recommended you make the following change in your medication: stop taking Simvastatin and start taking Atorvastatin 40 mg at bedtime.  Your physician wants you to follow-up in: 6 months. You will receive a reminder letter in the mail two months in advance. If you don't receive a letter, please call our office to schedule the follow-up appointment.   

## 2013-05-04 NOTE — Progress Notes (Signed)
HPI  Patient is seen today to followup vascular disease. He is post CABG. His last cath was done earlier this year. Coronaries were stable. He's doing well without any significant symptoms.  Allergies  Allergen Reactions  . Niacin Itching    Current Outpatient Prescriptions  Medication Sig Dispense Refill  . amLODipine (NORVASC) 10 MG tablet Take 10 mg by mouth daily.        [name] aspirin EC 81 MG EC tablet Take 1 tablet (81 mg total) by mouth daily.  30 tablet  3  . furosemide (LASIX) 40 MG tablet Take 40 mg by mouth daily.        [name] HYDROcodone-acetaminophen (VICODIN) 5-500 MG per tablet Take 1 tablet by mouth every 6 (six) hours as needed for pain.       . isosorbide mononitrate (IMDUR) 30 MG 24 hr tablet Take 1 tablet (30 mg total) by mouth daily.  90 tablet  1  . losartan (COZAAR) 100 MG tablet Take 100 mg by mouth daily.        . metFORMIN (GLUCOPHAGE) 1000 MG tablet Take 1 tablet (1,000 mg total) by mouth 2 (two) times daily with a meal.      . metoprolol tartrate (LOPRESSOR) 25 MG tablet Take 0.5 tablets (12.5 mg total) by mouth 2 (two) times daily.  90 tablet  1  . nitroGLYCERIN (NITROSTAT) 0.4 MG SL tablet Place 1 tablet (0.4 mg total) under the tongue every 5 (five) minutes x 3 doses as needed for chest pain.  25 tablet  3  . phenylephrine (SUDAFED PE) 10 MG TABS tablet Take 10 mg by mouth every 4 (four) hours as needed.      . simvastatin (ZOCOR) 20 MG tablet Take 1 tablet (20 mg total) by mouth at bedtime.  90 tablet  1  . tetrahydrozoline-zinc (VISINE-AC) 0.05-0.25 % ophthalmic solution 2 drops 3 (three) times daily as needed.      . warfarin (COUMADIN) 2 MG tablet Take 1 tablet (2 mg total) by mouth daily. Take 2 tablets (4 mg total) today (09/27/12), then 1 tablet (2mg  total) thereafter as directed by Coumadin clinic.       No current facility-administered medications for this visit.    History   Social History  . Marital Status: Married    Spouse Name: N/A    Number  of Children: N/A  . Years of Education: N/A   Occupational History  . Not on file.   Social History Main Topics  . Smoking status: Former Smoker    Quit date: 04/02/1998  . Smokeless tobacco: Never Used  . Alcohol Use: 0.0 oz/week    0-2 drink(s) per week     Comment: OCCASIONAL  . Drug Use: No  . Sexual Activity: Not on file   Other Topics Concern  . Not on file   Social History Narrative  . No narrative on file    Family History  Problem Relation Age of Onset  . Coronary artery disease      family hx of  . Deep vein thrombosis Father   . Cancer Sister     lung  . Diabetes Sister   . Heart disease Sister   . Hyperlipidemia Sister   . Hypertension Sister   . Cancer Brother     "crab cancer"  . Heart disease Brother   . Heart attack Brother   . Cancer Sister     lung  . Cancer Sister  breast  . Hypertension Mother     Past Medical History  Diagnosis Date  . Edema   . Hypertension   . Renal artery stenosis     50-70%  . Chest pain     precordial. mild chronic .[name]... nonischemic  . Coronary artery disease     a. Nuclear, January, 2008, no ischemia b. Cath 08/2012- 1/4 patent grafts, RCA CTO, no flow-limiting disease, medically managed  . Dyslipidemia   . Hx of CABG     2000  . S/P femoropopliteal bypass surgery     Dr. Donnetta Hutching  . Ejection fraction     Normal LV, nuclear, 2008, ( no echo data as July 31, 2010)  . Sinus bradycardia     Asymptomatic  . Carotid artery disease     Doppler 08/10/2010,  no change, RICA 7-82%, LICA 42-35%  . Neck pain     November, 2012  . Dizziness     November, 2012  . Myocardial infarction 1999  . Diabetes mellitus without complication   . Cancer     Prostate  . GERD (gastroesophageal reflux disease)     TAKES TUMS & ROLAIDS AS NEEDED  . CAD (coronary artery disease)     Past Surgical History  Procedure Laterality Date  . Coronary artery bypass graft  2000  . Wrist surgery      cyst removal  . Pr vein bypass  graft,aorto-fem-pop  07/19/1999    right  . Pr vein bypass graft,aorto-fem-pop  05/02/2006    left  . Cardiac catheterization  09/26/2012    1/4 patent bypass (occluded SVG-PDA, SVG-OM, LIMA-LAD), SVG-diagonal patent and fills the diagonal and LAD, distal RCA occlusion with left to right collateralization, patent circumflex, LAD with no flow-limiting disease and antegrade flow competitively from SVG-diagonal; EF 60-65%    Patient Active Problem List   Diagnosis Date Noted  . CAD (coronary artery disease)   . Type 2 diabetes mellitus 09/27/2012  . Hypertension 09/27/2012  . Unstable angina 09/24/2012  . Atherosclerosis of native arteries of the extremities with intermittent claudication 02/12/2012  . Limb swelling 02/12/2012  . 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 Neck pain   . Dizziness   . Carotid artery disease   . Renal artery stenosis   . S/P femoropopliteal bypass surgery   . Ejection fraction   . Sinus bradycardia   . DYSLIPIDEMIA 01/04/2010  . EDEMA 07/04/2008  . CHEST PAIN, PRECORDIAL 07/04/2008  . CORONARY ARTERY BYPASS GRAFT, HX OF 07/04/2008    ROS   Patient denies fever, chills, headache, sweats, rash, change in vision, change in hearing, chest pain, cough, nausea or vomiting, urinary symptoms. All other systems are reviewed and are negative.  PHYSICAL EXAM  Patient is oriented to person time and place. Affect is normal. He's here with his wife. He looks good today. There is no jugulovenous distention. Lungs are clear. Respiratory effort is nonlabored. Cardiac exam reveals S1 and S2. There is a soft systolic murmur. The abdomen is soft. There is no peripheral edema.  Filed Vitals:   05/04/13 1053  BP: 110/60  Pulse: 46  Height: 5\' 10"  (1.778 m)  Weight: 218 lb (98.884 kg)     ASSESSMENT & PLAN

## 2013-05-04 NOTE — Assessment & Plan Note (Signed)
Coronary disease is stable.  No further workup. 

## 2013-05-04 NOTE — Assessment & Plan Note (Signed)
He has been on simvastatin 20. I am recommending that we transition him to atorvastatin 40 based on the most recent guidelines. He will finish the simvastatin that he has at home. He'll then have a new prescription for 40 mg of atorvastatin.

## 2013-06-02 ENCOUNTER — Other Ambulatory Visit (HOSPITAL_COMMUNITY): Payer: Self-pay | Admitting: Cardiology

## 2013-06-02 DIAGNOSIS — I6529 Occlusion and stenosis of unspecified carotid artery: Secondary | ICD-10-CM

## 2013-06-09 ENCOUNTER — Ambulatory Visit (HOSPITAL_COMMUNITY): Payer: Medicare HMO | Attending: Cardiology | Admitting: Cardiology

## 2013-06-09 DIAGNOSIS — I6529 Occlusion and stenosis of unspecified carotid artery: Secondary | ICD-10-CM | POA: Insufficient documentation

## 2013-06-09 DIAGNOSIS — R42 Dizziness and giddiness: Secondary | ICD-10-CM

## 2013-06-09 NOTE — Progress Notes (Signed)
Carotid duplex complete 

## 2013-06-10 ENCOUNTER — Encounter: Payer: Self-pay | Admitting: Cardiology

## 2013-06-15 ENCOUNTER — Telehealth: Payer: Self-pay | Admitting: Cardiology

## 2013-06-15 NOTE — Telephone Encounter (Signed)
Pt given his Carotid duplex results, he verbalized understanding.

## 2013-06-15 NOTE — Telephone Encounter (Signed)
New message ° ° ° ° ° ° ° ° ° °Pt returning nurses call °

## 2013-06-23 ENCOUNTER — Encounter: Payer: Self-pay | Admitting: *Deleted

## 2013-07-13 ENCOUNTER — Other Ambulatory Visit: Payer: Self-pay | Admitting: *Deleted

## 2013-07-13 DIAGNOSIS — Z48812 Encounter for surgical aftercare following surgery on the circulatory system: Secondary | ICD-10-CM

## 2013-07-13 DIAGNOSIS — I714 Abdominal aortic aneurysm, without rupture, unspecified: Secondary | ICD-10-CM

## 2013-07-13 DIAGNOSIS — I739 Peripheral vascular disease, unspecified: Secondary | ICD-10-CM

## 2013-07-14 ENCOUNTER — Ambulatory Visit (HOSPITAL_COMMUNITY)
Admission: RE | Admit: 2013-07-14 | Discharge: 2013-07-14 | Disposition: A | Discharge status: Home / Self Care | Payer: Medicare HMO | Source: Ambulatory Visit | Attending: Family Medicine | Admitting: Family Medicine

## 2013-07-14 ENCOUNTER — Other Ambulatory Visit (HOSPITAL_COMMUNITY): Payer: Self-pay | Admitting: Family Medicine

## 2013-07-14 DIAGNOSIS — I10 Essential (primary) hypertension: Secondary | ICD-10-CM | POA: Insufficient documentation

## 2013-07-14 DIAGNOSIS — J209 Acute bronchitis, unspecified: Secondary | ICD-10-CM

## 2013-07-14 DIAGNOSIS — R059 Cough, unspecified: Secondary | ICD-10-CM | POA: Insufficient documentation

## 2013-07-14 DIAGNOSIS — Z951 Presence of aortocoronary bypass graft: Secondary | ICD-10-CM | POA: Insufficient documentation

## 2013-07-14 DIAGNOSIS — R05 Cough: Secondary | ICD-10-CM | POA: Insufficient documentation

## 2013-07-14 DIAGNOSIS — I252 Old myocardial infarction: Secondary | ICD-10-CM | POA: Insufficient documentation

## 2013-07-14 DIAGNOSIS — I2510 Atherosclerotic heart disease of native coronary artery without angina pectoris: Secondary | ICD-10-CM | POA: Insufficient documentation

## 2013-07-14 DIAGNOSIS — Z87891 Personal history of nicotine dependence: Secondary | ICD-10-CM | POA: Insufficient documentation

## 2013-07-21 ENCOUNTER — Encounter (INDEPENDENT_AMBULATORY_CARE_PROVIDER_SITE_OTHER): Payer: Self-pay

## 2013-07-21 ENCOUNTER — Encounter: Payer: Self-pay | Admitting: Gastroenterology

## 2013-07-21 ENCOUNTER — Ambulatory Visit (INDEPENDENT_AMBULATORY_CARE_PROVIDER_SITE_OTHER): Payer: Medicare HMO | Admitting: Gastroenterology

## 2013-07-21 VITALS — BP 130/72 | HR 45 | Temp 98.2°F | Ht 70.0 in | Wt 217.6 lb

## 2013-07-21 DIAGNOSIS — R1314 Dysphagia, pharyngoesophageal phase: Secondary | ICD-10-CM

## 2013-07-21 DIAGNOSIS — R1310 Dysphagia, unspecified: Secondary | ICD-10-CM

## 2013-07-21 DIAGNOSIS — R1319 Other dysphagia: Secondary | ICD-10-CM | POA: Insufficient documentation

## 2013-07-21 NOTE — Progress Notes (Signed)
Primary Care Physician:  Purvis Kilts, MD  Primary Gastroenterologist:  Garfield Cornea, MD   Chief Complaint  Patient presents with  . Dysphagia    HPI:  Troy [name] is a 73 y.o. male here for further evaluation of esophageal dysphagia. Dysphagia getting worse present for couple of years. Has to wash food down. But sometimes no problems swallowing. Has trouble swallowing pills, solid foods. Feels like congested in throat area. Provided antibiotics last week for this. States has been going on for some time.  No heartburn. Not on PPI. Occasional TUMS. No abdominal pain. BMs OK. Stays "gassed up" all the time. Doesn't matter what eat. No melena, brbpr. No prior upper endoscopy. Last colonoscopy 2011.   Reports on Coumadin due to peripheral vascular disease. Denies prior history of DVT or stroke. States that he does not have any stents. Coumadin managed by Dr. Hilma Favors.  Current Outpatient Prescriptions  Medication Sig Dispense Refill  . amLODipine (NORVASC) 10 MG tablet Take 10 mg by mouth daily.        [name] aspirin EC 81 MG EC tablet Take 1 tablet (81 mg total) by mouth daily.  30 tablet  3  . atorvastatin (LIPITOR) 40 MG tablet Take 1 tablet (40 mg total) by mouth daily.  90 tablet  3  . furosemide (LASIX) 40 MG tablet Take 40 mg by mouth daily.        [name] HYDROcodone-acetaminophen (VICODIN) 5-500 MG per tablet Take 1 tablet by mouth every 6 (six) hours as needed for pain.       . isosorbide mononitrate (IMDUR) 30 MG 24 hr tablet Take 1 tablet (30 mg total) by mouth daily.  90 tablet  1  . losartan (COZAAR) 100 MG tablet Take 100 mg by mouth daily.        . metFORMIN (GLUCOPHAGE) 1000 MG tablet Take 1 tablet (1,000 mg total) by mouth 2 (two) times daily with a meal.      . metoprolol tartrate (LOPRESSOR) 25 MG tablet Take 0.5 tablets (12.5 mg total) by mouth 2 (two) times daily.  90 tablet  1  . nitroGLYCERIN (NITROSTAT) 0.4 MG SL tablet Place 1 tablet (0.4 mg total) under the tongue every  5 (five) minutes x 3 doses as needed for chest pain.  25 tablet  3  . tetrahydrozoline-zinc (VISINE-AC) 0.05-0.25 % ophthalmic solution 2 drops 3 (three) times daily as needed.      . warfarin (COUMADIN) 2 MG tablet Take 1 tablet (2 mg total) by mouth daily. Take 2 tablets (4 mg total) today (09/27/12), then 1 tablet (2mg  total) thereafter as directed by Coumadin clinic.      . phenylephrine (SUDAFED PE) 10 MG TABS tablet Take 10 mg by mouth every 4 (four) hours as needed.       No current facility-administered medications for this visit.    Allergies as of 07/21/2013 - Review Complete 07/21/2013  Allergen Reaction Noted  . Niacin Itching     Past Medical History  Diagnosis Date  . Edema   . Hypertension   . Renal artery stenosis     50-70%  . Chest pain     precordial. mild chronic .[name]... nonischemic  . Coronary artery disease     a. Nuclear, January, 2008, no ischemia b. Cath 08/2012- 1/4 patent grafts, RCA CTO, no flow-limiting disease, medically managed  . Dyslipidemia   . Hx of CABG     2000  . S/P femoropopliteal bypass surgery  Dr. Donnetta Hutching  . Ejection fraction     Normal LV, nuclear, 2008, ( no echo data as July 31, 2010)  . Sinus bradycardia     Asymptomatic  . Carotid artery disease     Doppler 08/10/2010,  no change, RICA 3-41%, LICA 93-79%  . Neck pain     November, 2012  . Dizziness     November, 2012  . Myocardial infarction 1999  . Diabetes mellitus without complication   . Cancer     Prostate  . GERD (gastroesophageal reflux disease)     TAKES TUMS & ROLAIDS AS NEEDED  . CAD (coronary artery disease)   . AAA (abdominal aortic aneurysm)     Followed by Dr. Curt Jews    Past Surgical History  Procedure Laterality Date  . Coronary artery bypass graft  2000  . Wrist surgery      cyst removal  . Pr vein bypass graft,aorto-fem-pop  07/19/1999    right  . Pr vein bypass graft,aorto-fem-pop  05/02/2006    left  . Cardiac catheterization  09/26/2012     1/4 patent bypass (occluded SVG-PDA, SVG-OM, LIMA-LAD), SVG-diagonal patent and fills the diagonal and LAD, distal RCA occlusion with left to right collateralization, patent circumflex, LAD with no flow-limiting disease and antegrade flow competitively from SVG-diagonal; EF 60-65%  . Colonoscopy  11/30/2009    [id number]. next TCS 11/2019    Family History  Problem Relation Age of Onset  . Coronary artery disease      family hx of  . Deep vein thrombosis Father   . Cancer Sister     lung  . Diabetes Sister   . Heart disease Sister   . Hyperlipidemia Sister   . Hypertension Sister   . Cancer Brother     "crab cancer"  . Heart disease Brother   . Heart attack Brother   . Cancer Sister     lung  . Cancer Sister     breast  . Hypertension Mother   . Colon cancer Neg Hx     History   Social History  . Marital Status: Married    Spouse Name: N/A    Number of Children: N/A  . Years of Education: N/A   Occupational History  . Not on file.   Social History Main Topics  . Smoking status: Former Smoker    Quit date: 04/02/1998  . Smokeless tobacco: Never Used  . Alcohol Use: 0.0 oz/week    0-2 drink(s) per week     Comment: OCCASIONAL  . Drug Use: No  . Sexual Activity: Not on file   Other Topics Concern  . Not on file   Social History Narrative  . No narrative on file      ROS:  General: Negative for anorexia, unintentional weight loss, fever, chills, fatigue, weakness. Eyes: Negative for vision changes.  ENT: Negative for hoarseness, nasal congestion. See history of present illness CV: Negative for chest pain, angina, palpitations, dyspnea on exertion, peripheral edema.  Respiratory: Negative for dyspnea at rest, dyspnea on exertion, cough, sputum, wheezing. Complains of postnasal drip/upper airway congestion GI: See history of present illness. GU:  Negative for dysuria, hematuria, urinary incontinence, urinary frequency, nocturnal urination.  MS:  Negative for joint pain, low back pain.  Derm: Negative for rash or itching.  Neuro: Negative for weakness, abnormal sensation, seizure, frequent headaches, memory loss, confusion.  Psych: Negative for anxiety, depression, suicidal ideation, hallucinations.  Endo: Negative for unusual weight change.  Heme: Negative for bruising or bleeding. Allergy: Negative for rash or hives.    Physical Examination:  BP 130/72  Pulse 45  Temp(Src) 98.2 F (36.8 C) (Oral)  Ht 5\' 10"  (1.778 m)  Wt 217 lb 9.6 oz (98.703 kg)  BMI 31.22 kg/m2   General: Well-nourished, well-developed in no acute distress.  Head: Normocephalic, atraumatic.   Eyes: Conjunctiva pink, no icterus. Mouth: Oropharyngeal mucosa moist and pink , no lesions erythema or exudate. Neck: Supple without thyromegaly, masses, or lymphadenopathy.  Lungs: Clear to auscultation bilaterally.  Heart: Regular rate and rhythm, no murmurs rubs or gallops.  Abdomen: Bowel sounds are normal, nontender, nondistended, no hepatosplenomegaly or masses, no abdominal bruits or    hernia , no rebound or guarding.   Rectal: not performed Extremities: No lower extremity edema. No clubbing or deformities.  Neuro: Alert and oriented x 4 , grossly normal neurologically.  Skin: Warm and dry, no rash or jaundice.   Psych: Alert and cooperative, normal mood and affect.     Imaging Studies: Dg Chest 2 View  07/14/2013   CLINICAL DATA:  Productive cough for several months, former smoker, hypertension, coronary artery disease post MI and CABG, diabetes  EXAM: CHEST  2 VIEW  COMPARISON:  09/24/2012  FINDINGS: Upper normal heart size post CABG.  Mediastinal contours and pulmonary vascularity normal.  Lungs appear mildly hyperinflated.  No infiltrate, pleural effusion or pneumothorax.  Scattered endplate spur formation thoracic spine.  IMPRESSION: Post CABG.  No acute abnormalities.   Electronically Signed   By: Lavonia Dana M.D.   On: 07/14/2013 08:38

## 2013-07-21 NOTE — Assessment & Plan Note (Signed)
73 year old gentleman with progressive esophageal dysphagia to solid foods and pills. No typical reflux symptoms. No prior upper endoscopy. Recommend EGD with dilation in the near future. He is on chronic Coumadin therapy with history of peripheral vascular disease. We have sent request to Dr. Hilma Favors who is the managing physician to determine if we can hold his Coumadin for procedure or if Lovenox bridging as necessary. Once this information has been received we will schedule him for an EGD with dilation.  I have discussed the risks, alternatives, benefits with regards to but not limited to the risk of reaction to medication, bleeding, infection, perforation and the patient is agreeable to proceed. Written consent to be obtained.

## 2013-07-21 NOTE — Patient Instructions (Signed)
1. We will call you to schedule upper endoscopy once Dr. Hilma Favors provides instructions on how to manage your coumadin.

## 2013-07-22 NOTE — Progress Notes (Signed)
cc'd to pcp 

## 2013-07-27 ENCOUNTER — Encounter: Payer: Self-pay | Admitting: Vascular Surgery

## 2013-07-28 ENCOUNTER — Ambulatory Visit (HOSPITAL_COMMUNITY)
Admission: RE | Admit: 2013-07-28 | Discharge: 2013-07-28 | Disposition: A | Discharge status: Home / Self Care | Payer: Medicare HMO | Source: Ambulatory Visit | Attending: Vascular Surgery | Admitting: Vascular Surgery

## 2013-07-28 ENCOUNTER — Ambulatory Visit (INDEPENDENT_AMBULATORY_CARE_PROVIDER_SITE_OTHER)
Admission: RE | Admit: 2013-07-28 | Discharge: 2013-07-28 | Disposition: A | Discharge status: Home / Self Care | Payer: Medicare HMO | Source: Ambulatory Visit | Attending: Vascular Surgery | Admitting: Vascular Surgery

## 2013-07-28 ENCOUNTER — Ambulatory Visit (INDEPENDENT_AMBULATORY_CARE_PROVIDER_SITE_OTHER): Payer: Medicare HMO | Admitting: Vascular Surgery

## 2013-07-28 ENCOUNTER — Encounter: Payer: Self-pay | Admitting: Vascular Surgery

## 2013-07-28 VITALS — BP 160/79 | HR 49 | Resp 18 | Ht 70.0 in | Wt 218.5 lb

## 2013-07-28 DIAGNOSIS — I714 Abdominal aortic aneurysm, without rupture, unspecified: Secondary | ICD-10-CM | POA: Insufficient documentation

## 2013-07-28 DIAGNOSIS — Z48812 Encounter for surgical aftercare following surgery on the circulatory system: Secondary | ICD-10-CM

## 2013-07-28 DIAGNOSIS — I739 Peripheral vascular disease, unspecified: Secondary | ICD-10-CM

## 2013-07-28 NOTE — Progress Notes (Signed)
I have LMOM for patient to return my call.  

## 2013-07-28 NOTE — Progress Notes (Signed)
Here today for followup of diffuse peripheral vascular occlusive disease. He does have a known moderate sized infrarenal abdominal aortic aneurysm is seen for that. He is also status post lateral extremity bypasses approximately 15 years ago and is here for followup of these as well. He looks good today. He reports that he did have admission to the hospital briefly for some chest discomfort was felt to be a GI etiology. He has had no recent worsening of his heart disease. He has no claudication symptoms and no symptoms referable to his aneurysm  Past Medical History  Diagnosis Date  . Edema   . Hypertension   . Renal artery stenosis     50-70%  . Chest pain     precordial. mild chronic .[name]... nonischemic  . Coronary artery disease     a. Nuclear, January, 2008, no ischemia b. Cath 08/2012- 1/4 patent grafts, RCA CTO, no flow-limiting disease, medically managed  . Dyslipidemia   . Hx of CABG     2000  . S/P femoropopliteal bypass surgery     Dr. Donnetta Hutching  . Ejection fraction     Normal LV, nuclear, 2008, ( no echo data as July 31, 2010)  . Sinus bradycardia     Asymptomatic  . Carotid artery disease     Doppler 08/10/2010,  no change, RICA 2-37%, LICA 62-83%  . Neck pain     November, 2012  . Dizziness     November, 2012  . Myocardial infarction 1999  . Diabetes mellitus without complication   . Cancer     Prostate  . GERD (gastroesophageal reflux disease)     TAKES TUMS & ROLAIDS AS NEEDED  . CAD (coronary artery disease)   . AAA (abdominal aortic aneurysm)     Followed by Dr. Sherren Mocha [place]  . Pneumonia   . Arthritis     History  Substance Use Topics  . Smoking status: Former Smoker    Quit date: 04/02/1998  . Smokeless tobacco: Never Used  . Alcohol Use: 0.0 oz/week    0-2 drink(s) per week     Comment: OCCASIONAL    Family History  Problem Relation Age of Onset  . Coronary artery disease      family hx of  . Deep vein thrombosis Father   . Cancer Sister     lung  .  Diabetes Sister   . Heart disease Sister   . Hyperlipidemia Sister   . Hypertension Sister   . Cancer Brother     "crab cancer"  . Heart disease Brother   . Heart attack Brother   . Cancer Sister     lung  . Cancer Sister     breast  . Hypertension Mother   . Colon cancer Neg Hx     Allergies  Allergen Reactions  . Niacin Itching    Current outpatient prescriptions:amLODipine (NORVASC) 10 MG tablet, Take 10 mg by mouth daily.  , Disp: , Rfl: ;  aspirin EC 81 MG EC tablet, Take 1 tablet (81 mg total) by mouth daily., Disp: 30 tablet, Rfl: 3;  atorvastatin (LIPITOR) 40 MG tablet, Take 1 tablet (40 mg total) by mouth daily., Disp: 90 tablet, Rfl: 3;  furosemide (LASIX) 40 MG tablet, Take 40 mg by mouth daily.  , Disp: , Rfl:  HYDROcodone-acetaminophen (VICODIN) 5-500 MG per tablet, Take 1 tablet by mouth every 6 (six) hours as needed for pain. , Disp: , Rfl: ;  isosorbide mononitrate (IMDUR) 30 MG 24  hr tablet, Take 1 tablet (30 mg total) by mouth daily., Disp: 90 tablet, Rfl: 1;  losartan (COZAAR) 100 MG tablet, Take 100 mg by mouth daily.  , Disp: , Rfl:  metFORMIN (GLUCOPHAGE) 1000 MG tablet, Take 1 tablet (1,000 mg total) by mouth 2 (two) times daily with a meal., Disp: , Rfl: ;  metoprolol tartrate (LOPRESSOR) 25 MG tablet, Take 0.5 tablets (12.5 mg total) by mouth 2 (two) times daily., Disp: 90 tablet, Rfl: 1;  nitroGLYCERIN (NITROSTAT) 0.4 MG SL tablet, Place 1 tablet (0.4 mg total) under the tongue every 5 (five) minutes x 3 doses as needed for chest pain., Disp: 25 tablet, Rfl: 3 phenylephrine (SUDAFED PE) 10 MG TABS tablet, Take 10 mg by mouth every 4 (four) hours as needed., Disp: , Rfl: ;  tetrahydrozoline-zinc (VISINE-AC) 0.05-0.25 % ophthalmic solution, 2 drops 3 (three) times daily as needed., Disp: , Rfl: ;  warfarin (COUMADIN) 2 MG tablet, Take 1 tablet (2 mg total) by mouth daily. Take 2 tablets (4 mg total) today (09/27/12), then 1 tablet (2mg  total) thereafter as directed by  Coumadin clinic., Disp: , Rfl:   BP 160/79  Pulse 49  Resp 18  Ht 5\' 10"  (1.778 m)  Wt 218 lb 8 oz (99.111 kg)  BMI 31.35 kg/m2  Body mass index is 31.35 kg/(m^2).       Physical exam well-developed well-nourished gentleman appearing older than the stated age of 61 Carotid arteries without bruits bilaterally Heart regular rate and rhythm Chest clear with equal breath sounds bilaterally Neurologically he is grossly intact Pulse status: 2+ radial and 2+ dorsalis pedis pulses bilaterally. Abdomen obese with no aneurysm palpable Skin without ulcers or rashes  Vascular lab studies today reveal no change maximal size of his aneurysm at 4.2 cm. Lower extremity arterial studies reveal normal ankle arm index bilaterally and normal flow to bilateral vein grafts by ultrasound  Impression and plan: Stable lower extremity arterial study with patent bypasses bilaterally. Aneurysms a size is not enlarged. We will see him again in one year for continued surveillance of his aneurysm and lower extremity bypasses

## 2013-07-28 NOTE — Progress Notes (Signed)
Needs EGD with Dr. Gala Romney.   He will need Lovenox bridge per Dr. Hilma Favors and Dr. Gala Romney. Please let me know date of procedure and I will give bridge instructions.

## 2013-07-29 ENCOUNTER — Other Ambulatory Visit: Payer: Self-pay | Admitting: Internal Medicine

## 2013-07-29 DIAGNOSIS — R1319 Other dysphagia: Secondary | ICD-10-CM

## 2013-07-29 NOTE — Progress Notes (Signed)
Please follow the Medication Instructions below for your procedure:   Please take your last dose of Coumadin on 08/08/13.  (4 days before your procedure)  Start 1st Lovenox injection at 10 AM on 08/10/13  Continue Lovenox injections once a day at 10 am with the LAST DOSE the day before your procedure on 08/11/13.  DO NOT TAKE MORNING LOVENOX DAY OF YOUR PROCEDURE!!!    Your procedure is scheduled for 08/12/13 at 12:15.   You will need to continue lovenox and restart coumadin after your procedures AS DIRECTED by Dr. Gala Romney.  You will also need to have your bloodwork checked a few days after you restart your coumadin.   Hold evening dose of metformin on 08/11/13.

## 2013-07-29 NOTE — Progress Notes (Signed)
Noted instructions have been sent to Troy [name]

## 2013-07-29 NOTE — Progress Notes (Signed)
Patient is scheduled for EGD w/RMR on May 13th at 12:15 and he is aware that we will be sending him instructions

## 2013-07-31 ENCOUNTER — Encounter (HOSPITAL_COMMUNITY): Payer: Self-pay | Admitting: Pharmacy Technician

## 2013-08-03 ENCOUNTER — Telehealth: Payer: Self-pay | Admitting: Gastroenterology

## 2013-08-03 NOTE — Telephone Encounter (Signed)
Routing to refill box  

## 2013-08-03 NOTE — Telephone Encounter (Signed)
Patient is calling asking for Lovenox Bridge to be sent to [facility] , please advise

## 2013-08-03 NOTE — Telephone Encounter (Signed)
Troy [name]'s patient. I will defer to her regarding dosage of Lovenox for patient. However, it looks like last these are the instructions: "Please take your last dose of Coumadin on 08/08/13. (4 days before your procedure)  Start 1st Lovenox injection at 10 AM on 08/10/13  Continue Lovenox injections once a day at 10 am with the LAST DOSE the day before your procedure on 08/11/13. DO NOT TAKE MORNING LOVENOX DAY OF YOUR PROCEDURE!!!  Your procedure is scheduled for 08/12/13 at 12:15.  You will need to continue lovenox and restart coumadin after your procedures AS DIRECTED by Dr. Gala Romney. You will also need to have your bloodwork checked a few days after you restart your coumadin.  Hold evening dose of metformin on 08/11/13".

## 2013-08-04 MED ORDER — ENOXAPARIN SODIUM 150 MG/ML ~~LOC~~ SOLN: 150.0000 mg | SUBCUTANEOUS | Status: DC

## 2013-08-04 NOTE — Addendum Note (Signed)
Addended by: Mahala Menghini on: 08/04/2013 10:35 PM   Modules accepted: Orders

## 2013-08-04 NOTE — Telephone Encounter (Signed)
  RX done. Patient should have gotten previous instructions but please call him and make sure he knows how to give Lovenox. Needs Lovenox 150mg  daily at 10am as outlined on 08/10/13, 08/11/13, and per further instructions by dr. Gala Romney post-procedure.

## 2013-08-05 NOTE — Telephone Encounter (Signed)
Spoke with pt- he got his instructions and he understands them. I went over them again with him just to make sure he understood. He said his wife gives herself insulin injections so she is used to giving injections and will be giving them to him. He is aware that he will get further recommendations after his procedure.

## 2013-08-12 ENCOUNTER — Encounter (HOSPITAL_COMMUNITY)
Admission: RE | Disposition: A | Discharge status: Home / Self Care | Payer: Self-pay | Source: Ambulatory Visit | Attending: Internal Medicine

## 2013-08-12 ENCOUNTER — Ambulatory Visit (HOSPITAL_COMMUNITY)
Admission: RE | Admit: 2013-08-12 | Discharge: 2013-08-12 | Disposition: A | Discharge status: Home / Self Care | Payer: Medicare HMO | Source: Ambulatory Visit | Attending: Internal Medicine | Admitting: Internal Medicine

## 2013-08-12 ENCOUNTER — Encounter (HOSPITAL_COMMUNITY): Payer: Self-pay | Admitting: *Deleted

## 2013-08-12 DIAGNOSIS — I252 Old myocardial infarction: Secondary | ICD-10-CM | POA: Insufficient documentation

## 2013-08-12 DIAGNOSIS — K222 Esophageal obstruction: Secondary | ICD-10-CM | POA: Insufficient documentation

## 2013-08-12 DIAGNOSIS — K259 Gastric ulcer, unspecified as acute or chronic, without hemorrhage or perforation: Secondary | ICD-10-CM

## 2013-08-12 DIAGNOSIS — I2510 Atherosclerotic heart disease of native coronary artery without angina pectoris: Secondary | ICD-10-CM | POA: Insufficient documentation

## 2013-08-12 DIAGNOSIS — I10 Essential (primary) hypertension: Secondary | ICD-10-CM | POA: Insufficient documentation

## 2013-08-12 DIAGNOSIS — K5710 Diverticulosis of small intestine without perforation or abscess without bleeding: Secondary | ICD-10-CM | POA: Insufficient documentation

## 2013-08-12 DIAGNOSIS — K449 Diaphragmatic hernia without obstruction or gangrene: Secondary | ICD-10-CM | POA: Insufficient documentation

## 2013-08-12 DIAGNOSIS — K2960 Other gastritis without bleeding: Secondary | ICD-10-CM

## 2013-08-12 DIAGNOSIS — Z7982 Long term (current) use of aspirin: Secondary | ICD-10-CM | POA: Insufficient documentation

## 2013-08-12 DIAGNOSIS — Z79899 Other long term (current) drug therapy: Secondary | ICD-10-CM | POA: Insufficient documentation

## 2013-08-12 DIAGNOSIS — I714 Abdominal aortic aneurysm, without rupture, unspecified: Secondary | ICD-10-CM | POA: Insufficient documentation

## 2013-08-12 DIAGNOSIS — E119 Type 2 diabetes mellitus without complications: Secondary | ICD-10-CM | POA: Insufficient documentation

## 2013-08-12 DIAGNOSIS — R1319 Other dysphagia: Secondary | ICD-10-CM

## 2013-08-12 DIAGNOSIS — Z951 Presence of aortocoronary bypass graft: Secondary | ICD-10-CM | POA: Insufficient documentation

## 2013-08-12 DIAGNOSIS — Z87891 Personal history of nicotine dependence: Secondary | ICD-10-CM | POA: Insufficient documentation

## 2013-08-12 DIAGNOSIS — K2100 Gastro-esophageal reflux disease with esophagitis, without bleeding: Secondary | ICD-10-CM

## 2013-08-12 DIAGNOSIS — Z7901 Long term (current) use of anticoagulants: Secondary | ICD-10-CM | POA: Insufficient documentation

## 2013-08-12 DIAGNOSIS — I701 Atherosclerosis of renal artery: Secondary | ICD-10-CM | POA: Insufficient documentation

## 2013-08-12 HISTORY — PX: SAVORY DILATION: SHX5439

## 2013-08-12 HISTORY — PX: MALONEY DILATION: SHX5535

## 2013-08-12 HISTORY — PX: ESOPHAGOGASTRODUODENOSCOPY: SHX5428

## 2013-08-12 LAB — GLUCOSE, CAPILLARY: GLUCOSE-CAPILLARY: 109 mg/dL — AB (ref 70–99)

## 2013-08-12 SURGERY — EGD (ESOPHAGOGASTRODUODENOSCOPY)
Anesthesia: Moderate Sedation

## 2013-08-12 MED ORDER — SODIUM CHLORIDE 0.9 % IV SOLN
INTRAVENOUS | Status: DC
  Administered 2013-08-12: 12:00:00 via INTRAVENOUS

## 2013-08-12 MED ORDER — ONDANSETRON HCL 4 MG/2ML IJ SOLN
INTRAMUSCULAR | Status: DC | PRN
  Administered 2013-08-12: 4 mg via INTRAVENOUS

## 2013-08-12 MED ORDER — LIDOCAINE VISCOUS 2 % MT SOLN
OROMUCOSAL | Status: DC | PRN
  Administered 2013-08-12 (×2): 2 mL via OROMUCOSAL

## 2013-08-12 MED ORDER — STERILE WATER FOR IRRIGATION IR SOLN
Status: DC | PRN
  Administered 2013-08-12: 13:00:00

## 2013-08-12 MED ORDER — MEPERIDINE HCL 100 MG/ML IJ SOLN
INTRAMUSCULAR | Status: DC | PRN
  Administered 2013-08-12 (×2): 25 mg via INTRAVENOUS

## 2013-08-12 MED ORDER — MIDAZOLAM HCL 5 MG/5ML IJ SOLN
INTRAMUSCULAR | Status: AC
  Filled 2013-08-12: qty 10

## 2013-08-12 MED ORDER — MEPERIDINE HCL 100 MG/ML IJ SOLN
INTRAMUSCULAR | Status: AC
  Filled 2013-08-12: qty 2

## 2013-08-12 MED ORDER — LIDOCAINE VISCOUS 2 % MT SOLN
OROMUCOSAL | Status: AC
  Filled 2013-08-12: qty 15

## 2013-08-12 MED ORDER — ONDANSETRON HCL 4 MG/2ML IJ SOLN
INTRAMUSCULAR | Status: AC
  Filled 2013-08-12: qty 2

## 2013-08-12 MED ORDER — MIDAZOLAM HCL 5 MG/5ML IJ SOLN
INTRAMUSCULAR | Status: DC | PRN
  Administered 2013-08-12: 1 mg via INTRAVENOUS
  Administered 2013-08-12 (×2): 2 mg via INTRAVENOUS

## 2013-08-12 NOTE — Interval H&P Note (Signed)
History and Physical Interval Note:  08/12/2013 1:21 PM  Troy [name]  has presented today for surgery, with the diagnosis of DYSPHAGIA  The various methods of treatment have been discussed with the patient and family. After consideration of risks, benefits and other options for treatment, the patient has consented to  Procedure(s) with comments: ESOPHAGOGASTRODUODENOSCOPY (EGD) (N/A) - 12:15 SAVORY DILATION (N/A) MALONEY DILATION (N/A) as a surgical intervention .  The patient's history has been reviewed, patient examined, no change in status, stable for surgery.  I have reviewed the patient's chart and labs.  Questions were answered to the patient's satisfaction.     Cristopher Estimable Mildred Tuccillo  EGD with potential esophageal dilation as appropriate. Coumadin held. Lovenox bridge.The risks, benefits, limitations, alternatives and imponderables have been reviewed with the patient. Potential for esophageal dilation, biopsy, etc. have also been reviewed.  Questions have been answered. All parties agreeable.

## 2013-08-12 NOTE — Op Note (Signed)
[facility] [address] [place], [address]   ENDOSCOPY PROCEDURE REPORT  PATIENT: [name], [name]  MR#: [id number] BIRTHDATE: 10/25/1940 , 57  yrs. old GENDER: Male ENDOSCOPIST: R.  Garfield Cornea, MD FACP FACG REFERRED BY:  Sharilyn Sites, M.D. PROCEDURE DATE:  08/12/2013 PROCEDURE:     EGD with Venia Minks dilation followed by gastric biopsy  INDICATIONS:     esophageal dysphagia  INFORMED CONSENT:   The risks, benefits, limitations, alternatives and imponderables have been discussed.  The potential for biopsy, esophogeal dilation, etc. have also been reviewed.  Questions have been answered.  All parties agreeable.  Please see the history and physical in the medical record for more information.  MEDICATIONS:   Versed 5 mg IV and Demerol 50 mg IV in divided doses. Zofran 4 mg IV. Xylocaine gel orally  DESCRIPTION OF PROCEDURE:   The EG-[id number] ([id number])  endoscope was introduced through the mouth and advanced to the second portion of the duodenum without difficulty or limitations.  The mucosal surfaces were surveyed very carefully during advancement of the scope and upon withdrawal.  Retroflexion view of the proximal stomach and esophagogastric junction was performed.      FINDINGS: 2 inlet patches. Soft non-critical  benign -appearing peptic stricture with overlying distal esophageal erosions at the GE junction. No Barrett's esophagus. Stomach empty. Small hiatal hernia. Diffusely abnormal gastric mucosa characterized by "mottling" of the mucosa. No ulcer or infiltrating process.  Patent pylorus; deformity of the pyloric channel with scar and a single diverticulum suggestive of prior peptic ulcer disease.  THERAPEUTIC / DIAGNOSTIC MANEUVERS PERFORMED:  A 54 French Maloney dilators passed to full insertion easily. Subsequently, a 31 French Maloney dilators passed to full insertion with mild resistance. A look back revealed the stricture been nicely dilated  with minimal bleeding and no apparent complication. Bilaterally, biopsy the abnormal gastric mucosa.   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 Hiatal hernia. Abnormal gastric mucosa. Deformity of the pyloric channel suggestive of prior peptic ulcer disease. Duodenal bulbar diverticulum. Status post gastric biopsy.  RECOMMENDATIONS:  Begin Protonix 40 mg daily. Swallowing precautions reviewed with the family. Followup on pathology.  Resume Coumadin today. Resume Lovenox bridging tomorrow. Continue Lovenox until INR satisfactory as per Dr. Hilma Favors.    _______________________________ R. Garfield Cornea, MD FACP [facility] eSigned:  R. Garfield Cornea, MD FACP [facility] 08/12/2013 2:13 PM     CC:  PATIENT NAME:  Troy [name], Troy [name] MR#: [id number]

## 2013-08-12 NOTE — H&P (View-Only) (Signed)
Primary Care Physician:  Purvis Kilts, MD  Primary Gastroenterologist:  Garfield Cornea, MD   Chief Complaint  Patient presents with  . Dysphagia    HPI:  Troy [name] is a 73 y.o. male here for further evaluation of esophageal dysphagia. Dysphagia getting worse present for couple of years. Has to wash food down. But sometimes no problems swallowing. Has trouble swallowing pills, solid foods. Feels like congested in throat area. Provided antibiotics last week for this. States has been going on for some time.  No heartburn. Not on PPI. Occasional TUMS. No abdominal pain. BMs OK. Stays "gassed up" all the time. Doesn't matter what eat. No melena, brbpr. No prior upper endoscopy. Last colonoscopy 2011.   Reports on Coumadin due to peripheral vascular disease. Denies prior history of DVT or stroke. States that he does not have any stents. Coumadin managed by Dr. Hilma Favors.  Current Outpatient Prescriptions  Medication Sig Dispense Refill  . amLODipine (NORVASC) 10 MG tablet Take 10 mg by mouth daily.        [name] aspirin EC 81 MG EC tablet Take 1 tablet (81 mg total) by mouth daily.  30 tablet  3  . atorvastatin (LIPITOR) 40 MG tablet Take 1 tablet (40 mg total) by mouth daily.  90 tablet  3  . furosemide (LASIX) 40 MG tablet Take 40 mg by mouth daily.        [name] HYDROcodone-acetaminophen (VICODIN) 5-500 MG per tablet Take 1 tablet by mouth every 6 (six) hours as needed for pain.       . isosorbide mononitrate (IMDUR) 30 MG 24 hr tablet Take 1 tablet (30 mg total) by mouth daily.  90 tablet  1  . losartan (COZAAR) 100 MG tablet Take 100 mg by mouth daily.        . metFORMIN (GLUCOPHAGE) 1000 MG tablet Take 1 tablet (1,000 mg total) by mouth 2 (two) times daily with a meal.      . metoprolol tartrate (LOPRESSOR) 25 MG tablet Take 0.5 tablets (12.5 mg total) by mouth 2 (two) times daily.  90 tablet  1  . nitroGLYCERIN (NITROSTAT) 0.4 MG SL tablet Place 1 tablet (0.4 mg total) under the tongue every  5 (five) minutes x 3 doses as needed for chest pain.  25 tablet  3  . tetrahydrozoline-zinc (VISINE-AC) 0.05-0.25 % ophthalmic solution 2 drops 3 (three) times daily as needed.      . warfarin (COUMADIN) 2 MG tablet Take 1 tablet (2 mg total) by mouth daily. Take 2 tablets (4 mg total) today (09/27/12), then 1 tablet (2mg  total) thereafter as directed by Coumadin clinic.      . phenylephrine (SUDAFED PE) 10 MG TABS tablet Take 10 mg by mouth every 4 (four) hours as needed.       No current facility-administered medications for this visit.    Allergies as of 07/21/2013 - Review Complete 07/21/2013  Allergen Reaction Noted  . Niacin Itching     Past Medical History  Diagnosis Date  . Edema   . Hypertension   . Renal artery stenosis     50-70%  . Chest pain     precordial. mild chronic .[name]... nonischemic  . Coronary artery disease     a. Nuclear, January, 2008, no ischemia b. Cath 08/2012- 1/4 patent grafts, RCA CTO, no flow-limiting disease, medically managed  . Dyslipidemia   . Hx of CABG     2000  . S/P femoropopliteal bypass surgery  Dr. Early  . Ejection fraction     Normal LV, nuclear, 2008, ( no echo data as July 31, 2010)  . Sinus bradycardia     Asymptomatic  . Carotid artery disease     Doppler 08/10/2010,  no change, RICA 0-39%, LICA 50-69%  . Neck pain     November, 2012  . Dizziness     November, 2012  . Myocardial infarction 1999  . Diabetes mellitus without complication   . Cancer     Prostate  . GERD (gastroesophageal reflux disease)     TAKES TUMS & ROLAIDS AS NEEDED  . CAD (coronary artery disease)   . AAA (abdominal aortic aneurysm)     Followed by Dr. Todd Early    Past Surgical History  Procedure Laterality Date  . Coronary artery bypass graft  2000  . Wrist surgery      cyst removal  . Pr vein bypass graft,aorto-fem-pop  07/19/1999    right  . Pr vein bypass graft,aorto-fem-pop  05/02/2006    left  . Cardiac catheterization  09/26/2012     1/4 patent bypass (occluded SVG-PDA, SVG-OM, LIMA-LAD), SVG-diagonal patent and fills the diagonal and LAD, distal RCA occlusion with left to right collateralization, patent circumflex, LAD with no flow-limiting disease and antegrade flow competitively from SVG-diagonal; EF 60-65%  . Colonoscopy  11/30/2009    RMR:diverticulosis. next TCS 11/2019    Family History  Problem Relation Age of Onset  . Coronary artery disease      family hx of  . Deep vein thrombosis Father   . Cancer Sister     lung  . Diabetes Sister   . Heart disease Sister   . Hyperlipidemia Sister   . Hypertension Sister   . Cancer Brother     "crab cancer"  . Heart disease Brother   . Heart attack Brother   . Cancer Sister     lung  . Cancer Sister     breast  . Hypertension Mother   . Colon cancer Neg Hx     History   Social History  . Marital Status: Married    Spouse Name: N/A    Number of Children: N/A  . Years of Education: N/A   Occupational History  . Not on file.   Social History Main Topics  . Smoking status: Former Smoker    Quit date: 04/02/1998  . Smokeless tobacco: Never Used  . Alcohol Use: 0.0 oz/week    0-2 drink(s) per week     Comment: OCCASIONAL  . Drug Use: No  . Sexual Activity: Not on file   Other Topics Concern  . Not on file   Social History Narrative  . No narrative on file      ROS:  General: Negative for anorexia, unintentional weight loss, fever, chills, fatigue, weakness. Eyes: Negative for vision changes.  ENT: Negative for hoarseness, nasal congestion. See history of present illness CV: Negative for chest pain, angina, palpitations, dyspnea on exertion, peripheral edema.  Respiratory: Negative for dyspnea at rest, dyspnea on exertion, cough, sputum, wheezing. Complains of postnasal drip/upper airway congestion GI: See history of present illness. GU:  Negative for dysuria, hematuria, urinary incontinence, urinary frequency, nocturnal urination.  MS:  Negative for joint pain, low back pain.  Derm: Negative for rash or itching.  Neuro: Negative for weakness, abnormal sensation, seizure, frequent headaches, memory loss, confusion.  Psych: Negative for anxiety, depression, suicidal ideation, hallucinations.  Endo: Negative for unusual weight change.    Heme: Negative for bruising or bleeding. Allergy: Negative for rash or hives.    Physical Examination:  BP 130/72  Pulse 45  Temp(Src) 98.2 F (36.8 C) (Oral)  Ht 5' 10" (1.778 m)  Wt 217 lb 9.6 oz (98.703 kg)  BMI 31.22 kg/m2   General: Well-nourished, well-developed in no acute distress.  Head: Normocephalic, atraumatic.   Eyes: Conjunctiva pink, no icterus. Mouth: Oropharyngeal mucosa moist and pink , no lesions erythema or exudate. Neck: Supple without thyromegaly, masses, or lymphadenopathy.  Lungs: Clear to auscultation bilaterally.  Heart: Regular rate and rhythm, no murmurs rubs or gallops.  Abdomen: Bowel sounds are normal, nontender, nondistended, no hepatosplenomegaly or masses, no abdominal bruits or    hernia , no rebound or guarding.   Rectal: not performed Extremities: No lower extremity edema. No clubbing or deformities.  Neuro: Alert and oriented x 4 , grossly normal neurologically.  Skin: Warm and dry, no rash or jaundice.   Psych: Alert and cooperative, normal mood and affect.     Imaging Studies: Dg Chest 2 View  07/14/2013   CLINICAL DATA:  Productive cough for several months, former smoker, hypertension, coronary artery disease post MI and CABG, diabetes  EXAM: CHEST  2 VIEW  COMPARISON:  09/24/2012  FINDINGS: Upper normal heart size post CABG.  Mediastinal contours and pulmonary vascularity normal.  Lungs appear mildly hyperinflated.  No infiltrate, pleural effusion or pneumothorax.  Scattered endplate spur formation thoracic spine.  IMPRESSION: Post CABG.  No acute abnormalities.   Electronically Signed   By: Mark  Boles M.D.   On: 07/14/2013 08:38      

## 2013-08-12 NOTE — Discharge Instructions (Addendum)
GERD information provided  Swallowing precautions reviewed  Begin Protonix 40 mg daily  Resume Coumadin today  Resume Lovenox shots tomorrow;  Continue Lovenox shots daily until blood work satisfactory per Dr. Hilma Favors  Office visit with [place] in 6 months 02/11/14 at 10:00 AM  Appt with Dr. Epifania Gore Monday 08/17/13 at 9:15. Bloodwork (INR) to be checked at office visit.  Further recommendations to follow pending review of pathology report   EGD Discharge instructions Please read the instructions outlined below and refer to this sheet in the next few weeks. These discharge instructions provide you with general information on caring for yourself after you leave the hospital. Your doctor may also give you specific instructions. While your treatment has been planned according to the most current medical practices available, unavoidable complications occasionally occur. If you have any problems or questions after discharge, please call your doctor. ACTIVITY  You may resume your regular activity but move at a slower pace for the next 24 hours.   Take frequent rest periods for the next 24 hours.   Walking will help expel (get rid of) the air and reduce the bloated feeling in your abdomen.   No driving for 24 hours (because of the anesthesia (medicine) used during the test).   You may shower.   Do not sign any important legal documents or operate any machinery for 24 hours (because of the anesthesia used during the test).  NUTRITION  Drink plenty of fluids.   You may resume your normal diet.   Begin with a light meal and progress to your normal diet.   Avoid alcoholic beverages for 24 hours or as instructed by your caregiver.  MEDICATIONS  You may resume your normal medications unless your caregiver tells you otherwise.  WHAT YOU CAN EXPECT TODAY  You may experience abdominal discomfort such as a feeling of fullness or gas pains.  FOLLOW-UP  Your doctor will discuss the  results of your test with you.  SEEK IMMEDIATE MEDICAL ATTENTION IF ANY OF THE FOLLOWING OCCUR:  Excessive nausea (feeling sick to your stomach) and/or vomiting.   Severe abdominal pain and distention (swelling).   Trouble swallowing.   Temperature over 101 F (37.8 C).   Rectal bleeding or vomiting of blood.    Gastroesophageal Reflux Disease, Adult Gastroesophageal reflux disease (GERD) happens when acid from your stomach flows up into the esophagus. When acid comes in contact with the esophagus, the acid causes soreness (inflammation) in the esophagus. Over time, GERD may create small holes (ulcers) in the lining of the esophagus. CAUSES   Increased body weight. This puts pressure on the stomach, making acid rise from the stomach into the esophagus.  Smoking. This increases acid production in the stomach.  Drinking alcohol. This causes decreased pressure in the lower esophageal sphincter (valve or ring of muscle between the esophagus and stomach), allowing acid from the stomach into the esophagus.  Late evening meals and a full stomach. This increases pressure and acid production in the stomach.  A malformed lower esophageal sphincter. Sometimes, no cause is found. SYMPTOMS   Burning pain in the lower part of the mid-chest behind the breastbone and in the mid-stomach area. This may occur twice a week or more often.  Trouble swallowing.  Sore throat.  Dry cough.  Asthma-like symptoms including chest tightness, shortness of breath, or wheezing. DIAGNOSIS  Your caregiver may be able to diagnose GERD based on your symptoms. In some cases, X-rays and other tests may be done to  check for complications or to check the condition of your stomach and esophagus. TREATMENT  Your caregiver may recommend over-the-counter or prescription medicines to help decrease acid production. Ask your caregiver before starting or adding any new medicines.  HOME CARE INSTRUCTIONS   Change the  factors that you can control. Ask your caregiver for guidance concerning weight loss, quitting smoking, and alcohol consumption.  Avoid foods and drinks that make your symptoms worse, such as:  Caffeine or alcoholic drinks.  Chocolate.  Peppermint or mint flavorings.  Garlic and onions.  Spicy foods.  Citrus fruits, such as oranges, lemons, or limes.  Tomato-based foods such as sauce, chili, salsa, and pizza.  Fried and fatty foods.  Avoid lying down for the 3 hours prior to your bedtime or prior to taking a nap.  Eat small, frequent meals instead of large meals.  Wear loose-fitting clothing. Do not wear anything tight around your waist that causes pressure on your stomach.  Raise the head of your bed 6 to 8 inches with wood blocks to help you sleep. Extra pillows will not help.  Only take over-the-counter or prescription medicines for pain, discomfort, or fever as directed by your caregiver.  Do not take aspirin, ibuprofen, or other nonsteroidal anti-inflammatory drugs (NSAIDs). SEEK IMMEDIATE MEDICAL CARE IF:   You have pain in your arms, neck, jaw, teeth, or back.  Your pain increases or changes in intensity or duration.  You develop nausea, vomiting, or sweating (diaphoresis).  You develop shortness of breath, or you faint.  Your vomit is green, yellow, black, or looks like coffee grounds or blood.  Your stool is red, bloody, or black. These symptoms could be signs of other problems, such as heart disease, gastric bleeding, or esophageal bleeding. MAKE SURE YOU:   Understand these instructions.  Will watch your condition.  Will get help right away if you are not doing well or get worse. Document Released: 12/27/2004 Document Revised: 06/11/2011 Document Reviewed: 10/06/2010 [facility] Patient Information 2014 [place], [place].   Pantoprazole tablets What is this medicine? PANTOPRAZOLE (pan TOE pra zole) prevents the production of acid in the stomach. It is  used to treat gastroesophageal reflux disease (GERD), inflammation of the esophagus, and Zollinger-Ellison syndrome. This medicine may be used for other purposes; ask your health care provider or pharmacist if you have questions. COMMON BRAND NAME(S): Protonix What should I tell my health care provider before I take this medicine? They need to know if you have any of these conditions: -liver disease -low levels of magnesium in the blood -an unusual or allergic reaction to omeprazole, lansoprazole, pantoprazole, rabeprazole, other medicines, foods, dyes, or preservatives -pregnant or trying to get pregnant -breast-feeding How should I use this medicine? Take this medicine by mouth. Swallow the tablets whole with a drink of water. Follow the directions on the prescription label. Do not crush, break, or chew. Take your medicine at regular intervals. Do not take your medicine more often than directed. Talk to your pediatrician regarding the use of this medicine in children. While this drug may be prescribed for children as young as 5 years for selected conditions, precautions do apply. Overdosage: If you think you have taken too much of this medicine contact a poison control center or emergency room at once. NOTE: This medicine is only for you. Do not share this medicine with others. What if I miss a dose? If you miss a dose, take it as soon as you can. If it is almost time for  your next dose, take only that dose. Do not take double or extra doses. What may interact with this medicine? Do not take this medicine with any of the following medications: -atazanavir -nelfinavir This medicine may also interact with the following medications: -ampicillin -delavirdine -digoxin -diuretics -iron salts -medicines for fungal infections like ketoconazole, itraconazole and voriconazole -warfarin This list may not describe all possible interactions. Give your health care provider a list of all the  medicines, herbs, non-prescription drugs, or dietary supplements you use. Also tell them if you smoke, drink alcohol, or use illegal drugs. Some items may interact with your medicine. What should I watch for while using this medicine? It can take several days before your stomach pain gets better. Check with your doctor or health care professional if your condition does not start to get better, or if it gets worse. You may need blood work done while you are taking this medicine. What side effects may I notice from receiving this medicine? Side effects that you should report to your doctor or health care professional as soon as possible: -allergic reactions like skin rash, itching or hives, swelling of the face, lips, or tongue -bone, muscle or joint pain -breathing problems -chest pain or chest tightness -dark yellow or brown urine -dizziness -fast, irregular heartbeat -feeling faint or lightheaded -fever or sore throat -muscle spasm -palpitations -redness, blistering, peeling or loosening of the skin, including inside the mouth -seizures -tremors -unusual bleeding or bruising -unusually weak or tired -yellowing of the eyes or skin Side effects that usually do not require medical attention (Report these to your doctor or health care professional if they continue or are bothersome.): -constipation -diarrhea -dry mouth -headache -nausea This list may not describe all possible side effects. Call your doctor for medical advice about side effects. You may report side effects to FDA at 1-800-FDA-1088. Where should I keep my medicine? Keep out of the reach of children. Store at room temperature between 15 and 30 degrees C (59 and 86 degrees F). Protect from light and moisture. Throw away any unused medicine after the expiration date. NOTE: This sheet is a summary. It may not cover all possible information. If you have questions about this medicine, talk to your doctor, pharmacist, or health  care provider.  2014, [employer]. (2012-01-16 16:40:16)

## 2013-08-14 ENCOUNTER — Encounter (HOSPITAL_COMMUNITY): Payer: Self-pay | Admitting: Internal Medicine

## 2013-08-14 ENCOUNTER — Encounter: Payer: Self-pay | Admitting: Internal Medicine

## 2013-08-15 ENCOUNTER — Encounter: Payer: Self-pay | Admitting: Internal Medicine

## 2013-08-17 ENCOUNTER — Telehealth: Payer: Self-pay

## 2013-08-17 MED ORDER — AMOXICILL-CLARITHRO-LANSOPRAZ PO MISC: Freq: Two times a day (BID) | ORAL | Status: DC

## 2013-08-17 NOTE — Telephone Encounter (Signed)
cc'd Larene Pickett- he is going there tomorrow about his coumadin and INR and will speak with them about having INR done and inform them that he is taking abx.

## 2013-08-17 NOTE — Telephone Encounter (Signed)
Pt is aware. rx sent to the pharmacy. 

## 2013-08-17 NOTE — Telephone Encounter (Signed)
Letter from: Daneil Dolin  Reason for Letter: Results Review  Send letter to patient.  Send copy of letter with path to referring provider and PCP.  Pt needs PrevPak or generic equivalent x 14 days - hold any acid suppression and/or statin therapy pt may be taking during treatment (not PPI that is part of this regimen)   Decrease dose of coumadin by 1/2 beginning with treatment and check INR 5 days into therapy.  Pt may resume these meds after treatment.

## 2013-10-13 ENCOUNTER — Other Ambulatory Visit: Payer: Self-pay

## 2013-10-13 MED ORDER — NITROGLYCERIN 0.4 MG SL SUBL: 0.4000 mg | SUBLINGUAL_TABLET | SUBLINGUAL | Status: DC | PRN

## 2013-11-06 ENCOUNTER — Ambulatory Visit: Payer: Medicare HMO | Admitting: Cardiology

## 2013-11-29 ENCOUNTER — Encounter: Payer: Self-pay | Admitting: Cardiology

## 2013-11-30 ENCOUNTER — Encounter: Payer: Self-pay | Admitting: Cardiology

## 2013-11-30 ENCOUNTER — Ambulatory Visit (INDEPENDENT_AMBULATORY_CARE_PROVIDER_SITE_OTHER): Payer: Medicare HMO | Admitting: Cardiology

## 2013-11-30 VITALS — BP 142/68 | HR 50 | Ht 70.0 in | Wt 226.0 lb

## 2013-11-30 DIAGNOSIS — E785 Hyperlipidemia, unspecified: Secondary | ICD-10-CM

## 2013-11-30 DIAGNOSIS — I739 Peripheral vascular disease, unspecified: Secondary | ICD-10-CM

## 2013-11-30 DIAGNOSIS — I714 Abdominal aortic aneurysm, without rupture, unspecified: Secondary | ICD-10-CM

## 2013-11-30 DIAGNOSIS — I779 Disorder of arteries and arterioles, unspecified: Secondary | ICD-10-CM

## 2013-11-30 DIAGNOSIS — I25810 Atherosclerosis of coronary artery bypass graft(s) without angina pectoris: Secondary | ICD-10-CM

## 2013-11-30 MED ORDER — ATORVASTATIN CALCIUM 40 MG PO TABS: 40.0000 mg | ORAL_TABLET | Freq: Every day | ORAL | Status: DC

## 2013-11-30 NOTE — Assessment & Plan Note (Signed)
I am pushing hard to make sure that we get him to guide line directed therapy.

## 2013-11-30 NOTE — Progress Notes (Signed)
Patient ID: Troy [name], male   DOB: 28-Mar-1941, 73 y.o.   MRN: [id number]    HPI  Patient is seen follow up vascular disease. He's post CABG. Catheterization was done early in 2015. Coronaries were stable. He said follow up with vascular surgery in he is stable in this regard also. He's having some difficulties with swallowing. It is not yet clear if it is GI or throat related. Is not cardiac.   Allergies  Allergen Reactions  . Niacin Itching    Current Outpatient Prescriptions  Medication Sig Dispense Refill  . montelukast (SINGULAIR) 10 MG tablet Take 1 tablet by mouth daily.      . pantoprazole (PROTONIX) 40 MG tablet Take 1 tablet by mouth daily.      . simvastatin (ZOCOR) 20 MG tablet Take 1 tablet by mouth daily.      [name] amLODipine (NORVASC) 10 MG tablet Take 10 mg by mouth daily.        [name] amoxicillin-clarithromycin-lansoprazole (PREVPAC) combo pack Take by mouth 2 (two) times daily. Follow package directions.  1 kit  0  . aspirin EC 81 MG EC tablet Take 1 tablet (81 mg total) by mouth daily.  30 tablet  3  . enoxaparin (LOVENOX) 150 MG/ML injection Inject 1 mL (150 mg total) into the skin daily. As directed.  10 Syringe  0  . furosemide (LASIX) 40 MG tablet Take 40 mg by mouth daily.        [name] HYDROcodone-acetaminophen (VICODIN) 5-500 MG per tablet Take 1 tablet by mouth every 6 (six) hours as needed for pain.       . isosorbide mononitrate (IMDUR) 30 MG 24 hr tablet Take 1 tablet (30 mg total) by mouth daily.  90 tablet  1  . losartan (COZAAR) 100 MG tablet Take 100 mg by mouth daily.        . metFORMIN (GLUCOPHAGE) 1000 MG tablet Take 1 tablet (1,000 mg total) by mouth 2 (two) times daily with a meal.      . metoprolol tartrate (LOPRESSOR) 25 MG tablet Take 0.5 tablets (12.5 mg total) by mouth 2 (two) times daily.  90 tablet  1  . nitroGLYCERIN (NITROSTAT) 0.4 MG SL tablet Place 1 tablet (0.4 mg total) under the tongue every 5 (five) minutes x 3 doses as needed for chest pain.   25 tablet  3  . tetrahydrozoline-zinc (VISINE-AC) 0.05-0.25 % ophthalmic solution 2 drops 3 (three) times daily as needed.      . warfarin (COUMADIN) 2 MG tablet Take 2 mg by mouth daily.       No current facility-administered medications for this visit.    History   Social History  . Marital Status: Married    Spouse Name: N/A    Number of Children: N/A  . Years of Education: N/A   Occupational History  . Not on file.   Social History Main Topics  . Smoking status: Former Smoker    Quit date: 04/02/1998  . Smokeless tobacco: Never Used  . Alcohol Use: 0.0 oz/week    0-2 drink(s) per week     Comment: OCCASIONAL  . Drug Use: No  . Sexual Activity: Not on file   Other Topics Concern  . Not on file   Social History Narrative  . No narrative on file    Family History  Problem Relation Age of Onset  . Coronary artery disease      family hx of  . Deep vein thrombosis  Father   . Cancer Sister     lung  . Diabetes Sister   . Heart disease Sister   . Hyperlipidemia Sister   . Hypertension Sister   . Cancer Brother     "crab cancer"  . Heart disease Brother   . Heart attack Brother   . Cancer Sister     lung  . Cancer Sister     breast  . Hypertension Mother   . Colon cancer Neg Hx     Past Medical History  Diagnosis Date  . Edema   . Hypertension   . Renal artery stenosis     50-70%  . Chest pain     precordial. mild chronic .[name]... nonischemic  . Coronary artery disease     a. Nuclear, January, 2008, no ischemia b. Cath 08/2012- 1/4 patent grafts, RCA CTO, no flow-limiting disease, medically managed  . Dyslipidemia   . Hx of CABG     2000  . S/P femoropopliteal bypass surgery     Dr. Donnetta Hutching  . Ejection fraction     Normal LV, nuclear, 2008, ( no echo data as July 31, 2010)  . Sinus bradycardia     Asymptomatic  . Carotid artery disease     Doppler 08/10/2010,  no change, RICA 4-40%, LICA 10-27%  . Neck pain     November, 2012  . Dizziness      November, 2012  . Myocardial infarction 1999  . Diabetes mellitus without complication   . Cancer     Prostate  . GERD (gastroesophageal reflux disease)     TAKES TUMS & ROLAIDS AS NEEDED  . CAD (coronary artery disease)   . AAA (abdominal aortic aneurysm)     Followed by Dr. Sherren Mocha Early  . Pneumonia   . Arthritis     Past Surgical History  Procedure Laterality Date  . Coronary artery bypass graft  2000  . Wrist surgery      cyst removal  . Pr vein bypass graft,aorto-fem-pop  07/19/1999    right  . Pr vein bypass graft,aorto-fem-pop  05/02/2006    left  . Cardiac catheterization  09/26/2012    1/4 patent bypass (occluded SVG-PDA, SVG-OM, LIMA-LAD), SVG-diagonal patent and fills the diagonal and LAD, distal RCA occlusion with left to right collateralization, patent circumflex, LAD with no flow-limiting disease and antegrade flow competitively from SVG-diagonal; EF 60-65%  . Colonoscopy  11/30/2009    OZD:[id number]. next TCS 11/2019  . Esophagogastroduodenoscopy N/A 08/12/2013    Procedure: ESOPHAGOGASTRODUODENOSCOPY (EGD);  Surgeon: Daneil Dolin, MD;  Location: [facility];  Service: Endoscopy;  Laterality: N/A;  12:15  . Savory dilation N/A 08/12/2013    Procedure: SAVORY DILATION;  Surgeon: Daneil Dolin, MD;  Location: [facility];  Service: Endoscopy;  Laterality: N/A;  Venia Minks dilation N/A 08/12/2013    Procedure: Venia Minks DILATION;  Surgeon: Daneil Dolin, MD;  Location: [facility];  Service: Endoscopy;  Laterality: N/A;    Patient Active Problem List   Diagnosis Date Noted  . Abdominal aneurysm without mention of rupture 07/28/2013  . Peripheral vascular disease, unspecified 07/28/2013  . Esophageal dysphagia 07/21/2013  . CAD (coronary artery disease)   . Type 2 diabetes mellitus 09/27/2012  . Hypertension 09/27/2012  . Atherosclerosis of native arteries of the extremities with intermittent claudication 02/12/2012  . Limb swelling 02/12/2012  . Chronic  total occlusion of artery of the extremities 08/07/2011  . Neck pain   . Dizziness   .  Carotid artery disease   . Renal artery stenosis   . S/P femoropopliteal bypass surgery   . Ejection fraction   . Sinus bradycardia   . DYSLIPIDEMIA 01/04/2010  . EDEMA 07/04/2008  . CORONARY ARTERY BYPASS GRAFT, HX OF 07/04/2008    ROS  Patient denies fever, chills, headache, sweats, rash, change in vision, change in hearing, chest pain, cough, nausea vomiting, urinary symptoms. All other systems are reviewed and are negative.   PHYSICAL EXAM  Patient is oriented to person time and place. Affect is normal. He's here with his wife. Head is atraumatic. Sclera and conjunctiva are normal. There is no jugulovenous distention. Lungs reveal a few scattered rhonchi. There is no respiratory distress. Cardiac exam reveals S1 and S2. Abdomen is soft. There is no significant peripheral edema.     Filed Vitals:   11/30/13 1150  BP: 142/68  Pulse: 50  Height: '5\' 10"'  (1.778 m)  Weight: 226 lb (102.513 kg)   EKG is done today and reviewed by me. There old inferior Q waves. There is no significant change.  ASSESSMENT & PLAN

## 2013-11-30 NOTE — Assessment & Plan Note (Signed)
Coronary disease is stable. No change in therapy. 

## 2013-11-30 NOTE — Patient Instructions (Signed)
**Note De-Identified Troy [name] Obfuscation** Your physician has recommended you make the following change in your medication: stop taking Simvastatin and start taking Atorvastatin 40 mg at bedtime.  Your physician wants you to follow-up in: 6 months. You will receive a reminder letter in the mail two months in advance. If you don't receive a letter, please call our office to schedule the follow-up appointment.

## 2013-11-30 NOTE — Assessment & Plan Note (Signed)
Carotids are being followed carefully. No change in therapy.

## 2013-11-30 NOTE — Assessment & Plan Note (Signed)
His overall vascular status remains stable and followup with vascular surgery.

## 2013-12-11 ENCOUNTER — Other Ambulatory Visit (HOSPITAL_COMMUNITY): Payer: Self-pay | Admitting: *Deleted

## 2013-12-11 DIAGNOSIS — I6529 Occlusion and stenosis of unspecified carotid artery: Secondary | ICD-10-CM

## 2013-12-14 ENCOUNTER — Ambulatory Visit (HOSPITAL_COMMUNITY): Payer: Medicare HMO | Attending: Cardiology | Admitting: Radiology

## 2013-12-14 DIAGNOSIS — I2510 Atherosclerotic heart disease of native coronary artery without angina pectoris: Secondary | ICD-10-CM | POA: Insufficient documentation

## 2013-12-14 DIAGNOSIS — E119 Type 2 diabetes mellitus without complications: Secondary | ICD-10-CM | POA: Diagnosis not present

## 2013-12-14 DIAGNOSIS — I739 Peripheral vascular disease, unspecified: Secondary | ICD-10-CM | POA: Diagnosis not present

## 2013-12-14 DIAGNOSIS — I6529 Occlusion and stenosis of unspecified carotid artery: Secondary | ICD-10-CM

## 2013-12-14 DIAGNOSIS — E785 Hyperlipidemia, unspecified: Secondary | ICD-10-CM | POA: Diagnosis not present

## 2013-12-14 DIAGNOSIS — Z87891 Personal history of nicotine dependence: Secondary | ICD-10-CM | POA: Diagnosis not present

## 2013-12-14 DIAGNOSIS — Z951 Presence of aortocoronary bypass graft: Secondary | ICD-10-CM | POA: Diagnosis not present

## 2013-12-14 NOTE — Progress Notes (Signed)
Carotid Duplex performed. 

## 2013-12-15 ENCOUNTER — Encounter: Payer: Self-pay | Admitting: Cardiology

## 2014-02-09 ENCOUNTER — Encounter: Payer: Self-pay | Admitting: *Deleted

## 2014-02-11 ENCOUNTER — Ambulatory Visit (INDEPENDENT_AMBULATORY_CARE_PROVIDER_SITE_OTHER): Payer: Medicare HMO | Admitting: Gastroenterology

## 2014-02-11 ENCOUNTER — Encounter: Payer: Self-pay | Admitting: Gastroenterology

## 2014-02-11 VITALS — BP 128/74 | HR 74 | Temp 96.8°F | Ht 70.0 in | Wt 224.0 lb

## 2014-02-11 DIAGNOSIS — K208 Other esophagitis: Secondary | ICD-10-CM

## 2014-02-11 DIAGNOSIS — R1319 Other dysphagia: Secondary | ICD-10-CM

## 2014-02-11 DIAGNOSIS — R1314 Dysphagia, pharyngoesophageal phase: Secondary | ICD-10-CM

## 2014-02-11 DIAGNOSIS — R1310 Dysphagia, unspecified: Secondary | ICD-10-CM

## 2014-02-11 DIAGNOSIS — K2210 Ulcer of esophagus without bleeding: Secondary | ICD-10-CM

## 2014-02-11 NOTE — Progress Notes (Signed)
Primary Care Physician: Purvis Kilts, MD  Primary Gastroenterologist:  Garfield Cornea, MD   Chief Complaint  Patient presents with  . Follow-up    HPI: Troy [name] is a 73 y.o. male here for six-month follow-up. 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 Deformity of the pyloric channel suggestive of prior peptic ulcer disease. Abnormal gastric mucosa, H. Pylori gastritis (completed Prevpac).   Lot of sinus drainage. 1-2 years of chronic drainage. Plans to discuss with PCP to go to ENT. No heartburn. No abdominal pain. BMs regular. No melena, brbpr. Last TCS 2011. Dysphagia only to dry bread. Can swallowing meats and handful of pills at one time without problems.   Current Outpatient Prescriptions  Medication Sig Dispense Refill  . amLODipine (NORVASC) 10 MG tablet Take 10 mg by mouth daily.      [name] aspirin EC 81 MG EC tablet Take 1 tablet (81 mg total) by mouth daily. 30 tablet 3  . atorvastatin (LIPITOR) 40 MG tablet Take 1 tablet (40 mg total) by mouth daily. 90 tablet 1  . furosemide (LASIX) 40 MG tablet Take 40 mg by mouth daily.      [name] HYDROcodone-acetaminophen (VICODIN) 5-500 MG per tablet Take 1 tablet by mouth every 6 (six) hours as needed for pain.     . isosorbide mononitrate (IMDUR) 30 MG 24 hr tablet Take 1 tablet (30 mg total) by mouth daily. 90 tablet 1  . losartan (COZAAR) 100 MG tablet Take 100 mg by mouth daily.      . metFORMIN (GLUCOPHAGE) 1000 MG tablet Take 1 tablet (1,000 mg total) by mouth 2 (two) times daily with a meal.    . metoprolol tartrate (LOPRESSOR) 25 MG tablet Take 0.5 tablets (12.5 mg total) by mouth 2 (two) times daily. 90 tablet 1  . montelukast (SINGULAIR) 10 MG tablet Take 1 tablet by mouth daily.    . nitroGLYCERIN (NITROSTAT) 0.4 MG SL tablet Place 1 tablet (0.4 mg total) under the tongue every 5 (five) minutes x 3 doses as needed for  chest pain. 25 tablet 3  . pantoprazole (PROTONIX) 40 MG tablet Take 1 tablet by mouth daily.    [name] tetrahydrozoline-zinc (VISINE-AC) 0.05-0.25 % ophthalmic solution 2 drops 3 (three) times daily as needed.    . warfarin (COUMADIN) 2 MG tablet Take 2 mg by mouth daily.     No current facility-administered medications for this visit.    Allergies as of 02/11/2014 - Review Complete 02/11/2014  Allergen Reaction Noted  . Niacin Itching     ROS:  General: Negative for anorexia, weight loss, fever, chills, fatigue, weakness. ENT: Negative for hoarseness, difficulty swallowing , nasal congestion. CV: Negative for chest pain, angina, palpitations, dyspnea on exertion, peripheral edema.  Respiratory: Negative for dyspnea at rest, dyspnea on exertion, cough, sputum, wheezing.  GI: See history of present illness. GU:  Negative for dysuria, hematuria, urinary incontinence, urinary frequency, nocturnal urination.  Endo: Negative for unusual weight change.    Physical Examination:   BP 128/74 mmHg  Pulse 74  Temp(Src) 96.8 F (36 C) (Oral)  Ht 5\' 10"  (1.778 m)  Wt 224 lb (101.606 kg)  BMI 32.14 kg/m2  General: Well-nourished, well-developed in no acute distress.  Eyes: No icterus. Mouth: Oropharyngeal mucosa moist and pink , no lesions erythema or exudate. Lungs: Clear to auscultation bilaterally.  Heart: Regular rate and rhythm, no murmurs rubs or  gallops.  Abdomen: Bowel sounds are normal, nontender, nondistended, no hepatosplenomegaly or masses, no abdominal bruits or hernia , no rebound or guarding.   Extremities: No lower extremity edema. No clubbing or deformities. Neuro: Alert and oriented x 4   Skin: Warm and dry, no jaundice.   Psych: Alert and cooperative, normal mood and affect.

## 2014-02-11 NOTE — Patient Instructions (Signed)
1. Continue pantoprazole 40mg  daily.  2. Return to the office in 09/2014. 3. If you decide you want to pursue the esophagus xray, please let me know.

## 2014-02-12 NOTE — Assessment & Plan Note (Signed)
Continue protonix daily for h/o erosive esophagitis/benign-appearing peptic stricture. Return to office in 09/2014. Call if worsening dysphagia, then consider BPE. Encouraged follow up with ENT for chronic sinus drainage.

## 2014-02-24 NOTE — Progress Notes (Signed)
cc'ed to pcp °

## 2014-03-11 ENCOUNTER — Encounter (HOSPITAL_COMMUNITY): Payer: Self-pay | Admitting: Cardiovascular Disease

## 2014-04-20 DIAGNOSIS — E119 Type 2 diabetes mellitus without complications: Secondary | ICD-10-CM | POA: Diagnosis not present

## 2014-04-29 DIAGNOSIS — Z7901 Long term (current) use of anticoagulants: Secondary | ICD-10-CM | POA: Diagnosis not present

## 2014-05-31 ENCOUNTER — Ambulatory Visit (INDEPENDENT_AMBULATORY_CARE_PROVIDER_SITE_OTHER): Payer: Commercial Managed Care - HMO | Admitting: Cardiology

## 2014-05-31 ENCOUNTER — Other Ambulatory Visit: Payer: Self-pay

## 2014-05-31 ENCOUNTER — Encounter: Payer: Self-pay | Admitting: Cardiology

## 2014-05-31 VITALS — BP 134/62 | HR 54 | Ht 70.0 in | Wt 228.2 lb

## 2014-05-31 DIAGNOSIS — I25810 Atherosclerosis of coronary artery bypass graft(s) without angina pectoris: Secondary | ICD-10-CM | POA: Diagnosis not present

## 2014-05-31 DIAGNOSIS — E785 Hyperlipidemia, unspecified: Secondary | ICD-10-CM | POA: Diagnosis not present

## 2014-05-31 DIAGNOSIS — I10 Essential (primary) hypertension: Secondary | ICD-10-CM | POA: Diagnosis not present

## 2014-05-31 MED ORDER — PANTOPRAZOLE SODIUM 40 MG PO TBEC
40.0000 mg | DELAYED_RELEASE_TABLET | Freq: Every day | ORAL | Status: DC
Start: 2014-05-31 — End: 2014-12-10

## 2014-05-31 NOTE — Patient Instructions (Signed)
**Note De-identified Troy [name] Obfuscation** Your physician recommends that you continue on your current medications as directed. Please refer to the Current Medication list given to you today.  Your physician wants you to follow-up in: 6 months. You will receive a reminder letter in the mail two months in advance. If you don't receive a letter, please call our office to schedule the follow-up appointment.  

## 2014-05-31 NOTE — Progress Notes (Signed)
Cardiology Office Note   Date:  05/31/2014   ID:  Troy [name], DOB 04-12-1940, MRN [id number]  PCP:  Purvis Kilts, MD  Cardiologist:  Dola Argyle, MD   Chief Complaint  Patient presents with  . Appointment    Follow-up coronary artery disease      History of Present Illness: Troy [name] is a 74 y.o. male who presents today to follow up coronary disease. He is feeling well. He has no chest pain. He has back pain intermittently if he overdoes himself.    Past Medical History  Diagnosis Date  . Edema   . Hypertension   . Renal artery stenosis     50-70%  . Chest pain     precordial. mild chronic .[name]... nonischemic  . Coronary artery disease     a. Nuclear, January, 2008, no ischemia b. Cath 08/2012- 1/4 patent grafts, RCA CTO, no flow-limiting disease, medically managed  . Dyslipidemia   . Hx of CABG     2000  . S/P femoropopliteal bypass surgery     Dr. Donnetta Hutching  . Ejection fraction     Normal LV, nuclear, 2008, ( no echo data as July 31, 2010)  . Sinus bradycardia     Asymptomatic  . Carotid artery disease     Doppler 08/10/2010,  no change, RICA 4-19%, LICA 62-22%  . Neck pain     November, 2012  . Dizziness     November, 2012  . Myocardial infarction 1999  . Diabetes mellitus without complication   . Cancer     Prostate  . GERD (gastroesophageal reflux disease)     TAKES TUMS & ROLAIDS AS NEEDED  . CAD (coronary artery disease)   . AAA (abdominal aortic aneurysm)     Followed by Dr. Sherren Mocha Early  . Pneumonia   . Arthritis     Past Surgical History  Procedure Laterality Date  . Coronary artery bypass graft  2000  . Wrist surgery      cyst removal  . Pr vein bypass graft,aorto-fem-pop  07/19/1999    right  . Pr vein bypass graft,aorto-fem-pop  05/02/2006    left  . Cardiac catheterization  09/26/2012    1/4 patent bypass (occluded SVG-PDA, SVG-OM, LIMA-LAD), SVG-diagonal patent and fills the diagonal and LAD, distal RCA occlusion with left to  right collateralization, patent circumflex, LAD with no flow-limiting disease and antegrade flow competitively from SVG-diagonal; EF 60-65%  . Colonoscopy  11/30/2009    LNL:[id number]. next TCS 11/2019  . Esophagogastroduodenoscopy N/A 08/12/2013    GYJ:[id number] peptic stricture with erosive refluxesophagitis - status post Maloney dilation. Hiatal hernia. Abnormalgastric mucosa. Deformity of the pyloric channel suggestive ofprior peptic ulcer disease. Duodenal bulbar diverticulum Statuspost gastric biopsy. h.pylori  . Savory dilation N/A 08/12/2013    Procedure: SAVORY DILATION;  Surgeon: Daneil Dolin, MD;  Location: [facility];  Service: Endoscopy;  Laterality: N/A;  Venia Minks dilation N/A 08/12/2013    Procedure: Venia Minks DILATION;  Surgeon: Daneil Dolin, MD;  Location: [facility];  Service: Endoscopy;  Laterality: N/A;  . Left heart catheterization with coronary/graft angiogram N/A 09/26/2012    Procedure: LEFT HEART CATHETERIZATION WITH Beatrix Fetters;  Surgeon: Burnell Blanks, MD;  Location: [facility] CATH LAB;  Service: Cardiovascular;  Laterality: N/A;    Patient Active Problem List   Diagnosis Date Noted  . Erosive esophagitis 02/11/2014  . Abdominal aneurysm without mention of rupture 07/28/2013  . Peripheral vascular disease, unspecified  07/28/2013  . Esophageal dysphagia 07/21/2013  . CAD (coronary artery disease)   . Type 2 diabetes mellitus 09/27/2012  . Hypertension 09/27/2012  . Atherosclerosis of native arteries of the extremities with intermittent claudication 02/12/2012  . Limb swelling 02/12/2012  . 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 Neck pain   . Dizziness   . Carotid artery disease   . Renal artery stenosis   . S/P femoropopliteal bypass surgery   . Ejection fraction   . Sinus bradycardia   . DYSLIPIDEMIA 01/04/2010  . EDEMA 07/04/2008  . CORONARY ARTERY BYPASS GRAFT, HX OF 07/04/2008      Current  Outpatient Prescriptions  Medication Sig Dispense Refill  . amLODipine (NORVASC) 10 MG tablet Take 10 mg by mouth daily.      [name] aspirin EC 81 MG EC tablet Take 1 tablet (81 mg total) by mouth daily. 30 tablet 3  . atorvastatin (LIPITOR) 40 MG tablet Take 1 tablet (40 mg total) by mouth daily. 90 tablet 1  . fexofenadine (ALLEGRA) 180 MG tablet Take 180 mg by mouth daily.    . furosemide (LASIX) 40 MG tablet Take 40 mg by mouth daily.      [name] HYDROcodone-acetaminophen (VICODIN) 5-500 MG per tablet Take 1 tablet by mouth every 6 (six) hours as needed for pain.     . isosorbide mononitrate (IMDUR) 30 MG 24 hr tablet Take 1 tablet (30 mg total) by mouth daily. 90 tablet 1  . losartan (COZAAR) 100 MG tablet Take 100 mg by mouth daily.      . metFORMIN (GLUCOPHAGE) 1000 MG tablet Take 1 tablet (1,000 mg total) by mouth 2 (two) times daily with a meal.    . metoprolol tartrate (LOPRESSOR) 25 MG tablet Take 0.5 tablets (12.5 mg total) by mouth 2 (two) times daily. 90 tablet 1  . montelukast (SINGULAIR) 10 MG tablet Take 1 tablet by mouth daily.    . nitroGLYCERIN (NITROSTAT) 0.4 MG SL tablet Place 1 tablet (0.4 mg total) under the tongue every 5 (five) minutes x 3 doses as needed for chest pain. 25 tablet 3  . pantoprazole (PROTONIX) 40 MG tablet Take 1 tablet (40 mg total) by mouth daily. 30 tablet 5  . tetrahydrozoline-zinc (VISINE-AC) 0.05-0.25 % ophthalmic solution 2 drops 3 (three) times daily as needed.    . warfarin (COUMADIN) 2 MG tablet Take 2 mg by mouth daily.     No current facility-administered medications for this visit.    Allergies:   Niacin    Social History:  The patient  reports that he quit smoking about 16 years ago. He has never used smokeless tobacco. He reports that he drinks alcohol. He reports that he does not use illicit drugs.   Family History:  The patient's family history includes Cancer in his brother, sister, sister, and sister; Coronary artery disease in an other  family member; Deep vein thrombosis in his father; Diabetes in his sister; Heart attack in his brother; Heart disease in his brother and sister; Hyperlipidemia in his sister; Hypertension in his mother and sister. There is no history of Colon cancer.    ROS:  Please see the history of present illness.     Patient denies fever, chills, headache, sweats, rash, change in vision, change in hearing, chest pain, cough, nausea vomiting, urinary symptoms. All other systems are reviewed and are negative.   PHYSICAL EXAM: VS:  BP 134/62 mmHg  Pulse 54  Ht 5\' 10"  (  1.778 m)  Wt 228 lb 3.2 oz (103.511 kg)  BMI 32.74 kg/m2 , Patient has lost some weight appropriately. He is oriented to person time and place. Affect is normal. He is here with his wife. Head is atraumatic. Sclerae and conjunctiva are normal. There is no jugulovenous distention. Lungs are clear. Respiratory effort is unlabored. Cardiac exam reveals S1 and S2. Abdomen is protuberant but soft. There is no peripheral edema. There are no musculoskeletal deformities. There is no skin rash.  EKG:   EKG is not done today.   Recent Labs: No results found for requested labs within last 365 days.    Lipid Panel    Component Value Date/Time   CHOL 129 09/25/2012 0618   TRIG 152* 09/25/2012 0618   HDL 31* 09/25/2012 0618   CHOLHDL 4.2 09/25/2012 0618   VLDL 30 09/25/2012 0618   LDLCALC 68 09/25/2012 0618      Wt Readings from Last 3 Encounters:  05/31/14 228 lb 3.2 oz (103.511 kg)  02/11/14 224 lb (101.606 kg)  11/30/13 226 lb (102.513 kg)      Current medicines are reviewed       ASSESSMENT AND PLAN:

## 2014-05-31 NOTE — Assessment & Plan Note (Signed)
Coronary disease is stable. No change in therapy. 

## 2014-05-31 NOTE — Assessment & Plan Note (Signed)
Lipitor being treated properly. No change in therapy.

## 2014-05-31 NOTE — Assessment & Plan Note (Signed)
Blood pressure control. No change in therapy.

## 2014-06-03 DIAGNOSIS — Z7901 Long term (current) use of anticoagulants: Secondary | ICD-10-CM | POA: Diagnosis not present

## 2014-06-14 DIAGNOSIS — H4011X1 Primary open-angle glaucoma, mild stage: Secondary | ICD-10-CM | POA: Diagnosis not present

## 2014-06-15 ENCOUNTER — Other Ambulatory Visit (HOSPITAL_COMMUNITY): Payer: Self-pay | Admitting: Cardiology

## 2014-06-15 DIAGNOSIS — I6523 Occlusion and stenosis of bilateral carotid arteries: Secondary | ICD-10-CM

## 2014-06-16 DIAGNOSIS — Z7901 Long term (current) use of anticoagulants: Secondary | ICD-10-CM | POA: Diagnosis not present

## 2014-06-16 DIAGNOSIS — E6609 Other obesity due to excess calories: Secondary | ICD-10-CM | POA: Diagnosis not present

## 2014-06-16 DIAGNOSIS — E782 Mixed hyperlipidemia: Secondary | ICD-10-CM | POA: Diagnosis not present

## 2014-06-16 DIAGNOSIS — I10 Essential (primary) hypertension: Secondary | ICD-10-CM | POA: Diagnosis not present

## 2014-06-16 DIAGNOSIS — E119 Type 2 diabetes mellitus without complications: Secondary | ICD-10-CM | POA: Diagnosis not present

## 2014-06-16 DIAGNOSIS — Z6834 Body mass index (BMI) 34.0-34.9, adult: Secondary | ICD-10-CM | POA: Diagnosis not present

## 2014-06-21 DIAGNOSIS — H4011X1 Primary open-angle glaucoma, mild stage: Secondary | ICD-10-CM | POA: Diagnosis not present

## 2014-06-23 ENCOUNTER — Ambulatory Visit (HOSPITAL_COMMUNITY): Payer: Commercial Managed Care - HMO | Attending: Cardiology | Admitting: *Deleted

## 2014-06-23 ENCOUNTER — Other Ambulatory Visit (HOSPITAL_COMMUNITY): Payer: Self-pay | Admitting: *Deleted

## 2014-06-23 DIAGNOSIS — I6523 Occlusion and stenosis of bilateral carotid arteries: Secondary | ICD-10-CM

## 2014-06-23 NOTE — Progress Notes (Signed)
Carotid duplex Exam Performed

## 2014-07-09 ENCOUNTER — Telehealth: Payer: Self-pay | Admitting: Cardiology

## 2014-07-09 NOTE — Telephone Encounter (Signed)
Follow Up      Pt returning Lynn's phone call.

## 2014-07-14 NOTE — Telephone Encounter (Signed)
The pt is advised that the last time I called him was to give him his Carotid Doppler results on 3/31 and that I have not called since then. He verbalized understanding.

## 2014-07-21 DIAGNOSIS — Z7901 Long term (current) use of anticoagulants: Secondary | ICD-10-CM | POA: Diagnosis not present

## 2014-07-26 ENCOUNTER — Encounter: Payer: Self-pay | Admitting: Vascular Surgery

## 2014-07-27 ENCOUNTER — Encounter: Payer: Self-pay | Admitting: Family

## 2014-07-27 ENCOUNTER — Ambulatory Visit (INDEPENDENT_AMBULATORY_CARE_PROVIDER_SITE_OTHER): Payer: Commercial Managed Care - HMO | Admitting: Family

## 2014-07-27 ENCOUNTER — Ambulatory Visit (HOSPITAL_COMMUNITY)
Admission: RE | Admit: 2014-07-27 | Discharge: 2014-07-27 | Disposition: A | Discharge status: Home / Self Care | Payer: Commercial Managed Care - HMO | Source: Ambulatory Visit | Attending: Family | Admitting: Family

## 2014-07-27 ENCOUNTER — Ambulatory Visit (INDEPENDENT_AMBULATORY_CARE_PROVIDER_SITE_OTHER)
Admission: RE | Admit: 2014-07-27 | Discharge: 2014-07-27 | Disposition: A | Discharge status: Home / Self Care | Payer: Commercial Managed Care - HMO | Source: Ambulatory Visit | Attending: Family | Admitting: Family

## 2014-07-27 VITALS — BP 142/70 | HR 47 | Resp 14 | Ht 70.0 in | Wt 228.0 lb

## 2014-07-27 DIAGNOSIS — I714 Abdominal aortic aneurysm, without rupture, unspecified: Secondary | ICD-10-CM

## 2014-07-27 DIAGNOSIS — I739 Peripheral vascular disease, unspecified: Secondary | ICD-10-CM

## 2014-07-27 DIAGNOSIS — Z48812 Encounter for surgical aftercare following surgery on the circulatory system: Secondary | ICD-10-CM | POA: Diagnosis not present

## 2014-07-27 DIAGNOSIS — Z9889 Other specified postprocedural states: Secondary | ICD-10-CM | POA: Diagnosis not present

## 2014-07-27 DIAGNOSIS — E1159 Type 2 diabetes mellitus with other circulatory complications: Secondary | ICD-10-CM | POA: Diagnosis not present

## 2014-07-27 DIAGNOSIS — Z95828 Presence of other vascular implants and grafts: Secondary | ICD-10-CM | POA: Diagnosis not present

## 2014-07-27 DIAGNOSIS — E1151 Type 2 diabetes mellitus with diabetic peripheral angiopathy without gangrene: Secondary | ICD-10-CM

## 2014-07-27 DIAGNOSIS — I872 Venous insufficiency (chronic) (peripheral): Secondary | ICD-10-CM

## 2014-07-27 DIAGNOSIS — Z87891 Personal history of nicotine dependence: Secondary | ICD-10-CM

## 2014-07-27 NOTE — Progress Notes (Signed)
[facility] [place] HISTORY AND PHYSICAL   MRN : [id number]  History of Present Illness:   Troy [name] is a 74 y.o. male patient of Dr. 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 He does have a known moderate sized infrarenal abdominal aortic aneurysm is seen for that. He is also status post bilateral extremity bypasses about 2000 and is here for followup of these as well. He looks good today. He reports that he did have admission to the hospital briefly for some chest discomfort was felt to be a GI etiology. He has had no recent worsening of his heart disease. He has no claudication symptoms and no symptoms referable to his aneurysm. He has tried knee high compression hose for lower legs swelling during the day and he states they make his legs hurt and swell more, he has no swelling in his legs in the morning. 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 He denies any history of stroke or TIA. He was hospitalized in the last year for chest pain which pt indicates was not cardiac related.   Pt Diabetic: Yes, states his A1C was 6.?, in good control Pt smoker: former smoker, quit in 1999  Pt meds include: Statin :Yes Betablocker: Yes ASA: Yes Other anticoagulants/antiplatelets: on coumadin due to recurrent right leg arterial stenoses per pt   Current Outpatient Prescriptions  Medication Sig Dispense Refill  . amLODipine (NORVASC) 10 MG tablet Take 10 mg by mouth daily.      [name] aspirin EC 81 MG EC tablet Take 1 tablet (81 mg total) by mouth daily. 30 tablet 3  . atorvastatin (LIPITOR) 40 MG tablet Take 1 tablet (40 mg total) by mouth daily. 90 tablet 1  . fexofenadine (ALLEGRA) 180 MG tablet Take 180 mg by mouth daily.    . furosemide (LASIX) 40 MG tablet Take 40 mg by mouth daily.      [name] HYDROcodone-acetaminophen (VICODIN) 5-500 MG  per tablet Take 1 tablet by mouth every 6 (six) hours as needed for pain.     . Isopropyl Alcohol (ALCOHOL PREP PADS EX)     . isosorbide mononitrate (IMDUR) 30 MG 24 hr tablet Take 1 tablet (30 mg total) by mouth daily. 90 tablet 1  . losartan (COZAAR) 100 MG tablet Take 100 mg by mouth daily.      . metFORMIN (GLUCOPHAGE) 1000 MG tablet Take 1 tablet (1,000 mg total) by mouth 2 (two) times daily with a meal.    . metoprolol tartrate (LOPRESSOR) 25 MG tablet Take 0.5 tablets (12.5 mg total) by mouth 2 (two) times daily. 90 tablet 1  . montelukast (SINGULAIR) 10 MG tablet Take 1 tablet by mouth daily.    . nitroGLYCERIN (NITROSTAT) 0.4 MG SL tablet Place 1 tablet (0.4 mg total) under the tongue every 5 (five) minutes x 3 doses as needed for chest pain. 25 tablet 3  . pantoprazole (PROTONIX) 40 MG tablet Take 1 tablet (40 mg total) by mouth daily. 30 tablet 5  . tetrahydrozoline-zinc (VISINE-AC) 0.05-0.25 % ophthalmic solution 2 drops 3 (three) times daily as needed.    . warfarin (COUMADIN) 2 MG tablet Take 2 mg by mouth daily.     No current facility-administered medications for this visit.    Past Medical History  Diagnosis Date  . Edema   . Hypertension   .  Renal artery stenosis     50-70%  . Chest pain     precordial. mild chronic .[name]... nonischemic  . Coronary artery disease     a. Nuclear, January, 2008, no ischemia b. Cath 08/2012- 1/4 patent grafts, RCA CTO, no flow-limiting disease, medically managed  . Dyslipidemia   . Hx of CABG     2000  . S/P femoropopliteal bypass surgery     Dr. Donnetta Hutching  . Ejection fraction     Normal LV, nuclear, 2008, ( no echo data as July 31, 2010)  . Sinus bradycardia     Asymptomatic  . Carotid artery disease     Doppler 08/10/2010,  no change, RICA 4-01%, LICA 02-72%  . Neck pain     November, 2012  . Dizziness     November, 2012  . Myocardial infarction 1999  . Diabetes mellitus without complication   . GERD (gastroesophageal reflux  disease)     TAKES TUMS & ROLAIDS AS NEEDED  . CAD (coronary artery disease)   . AAA (abdominal aortic aneurysm)     Followed by Dr. Sherren Mocha Early  . Pneumonia   . Arthritis   . Cancer     Prostate:  Radiation Tx    Social History History  Substance Use Topics  . Smoking status: Former Smoker    Quit date: 04/02/1998  . Smokeless tobacco: Never Used  . Alcohol Use: 0.0 oz/week    0-2 Standard drinks or equivalent per week     Comment: OCCASIONAL    Family History Family History  Problem Relation Age of Onset  . Coronary artery disease      family hx of  . Deep vein thrombosis Father   . Cancer Sister     lung  . Diabetes Sister   . Heart disease Sister     After age 65  . Hyperlipidemia Sister   . Hypertension Sister   . Cancer Brother     "crab cancer"  . Heart disease Brother   . Heart attack Brother   . Cancer Sister     lung  . Cancer Sister     breast  . Hypertension Mother   . Colon cancer Neg Hx   . Heart attack Daughter   . Diabetes Sister     Surgical History Past Surgical History  Procedure Laterality Date  . Coronary artery bypass graft  2000  . Wrist surgery      cyst removal  . Pr vein bypass graft,aorto-fem-pop  07/19/1999    right  . Pr vein bypass graft,aorto-fem-pop  05/02/2006    left  . Cardiac catheterization  09/26/2012    1/4 patent bypass (occluded SVG-PDA, SVG-OM, LIMA-LAD), SVG-diagonal patent and fills the diagonal and LAD, distal RCA occlusion with left to right collateralization, patent circumflex, LAD with no flow-limiting disease and antegrade flow competitively from SVG-diagonal; EF 60-65%  . Colonoscopy  11/30/2009    ZDG:[id number]. next TCS 11/2019  . Esophagogastroduodenoscopy N/A 08/12/2013    OVF:[id number] peptic stricture with erosive refluxesophagitis - status post Maloney dilation. Hiatal hernia. Abnormalgastric mucosa. Deformity of the pyloric channel suggestive ofprior peptic ulcer disease. Duodenal bulbar  diverticulum Statuspost gastric biopsy. h.pylori  . Savory dilation N/A 08/12/2013    Procedure: SAVORY DILATION;  Surgeon: Daneil Dolin, MD;  Location: [facility];  Service: Endoscopy;  Laterality: N/A;  Venia Minks dilation N/A 08/12/2013    Procedure: Venia Minks DILATION;  Surgeon: Daneil Dolin, MD;  Location: [facility];  Service: Endoscopy;  Laterality: N/A;  . Left heart catheterization with coronary/graft angiogram N/A 09/26/2012    Procedure: LEFT HEART CATHETERIZATION WITH Beatrix Fetters;  Surgeon: Burnell Blanks, MD;  Location: [facility];  Service: Cardiovascular;  Laterality: N/A;    Allergies  Allergen Reactions  . Niacin Itching and Rash    Burning sensation    Current Outpatient Prescriptions  Medication Sig Dispense Refill  . amLODipine (NORVASC) 10 MG tablet Take 10 mg by mouth daily.      [name] aspirin EC 81 MG EC tablet Take 1 tablet (81 mg total) by mouth daily. 30 tablet 3  . atorvastatin (LIPITOR) 40 MG tablet Take 1 tablet (40 mg total) by mouth daily. 90 tablet 1  . fexofenadine (ALLEGRA) 180 MG tablet Take 180 mg by mouth daily.    . furosemide (LASIX) 40 MG tablet Take 40 mg by mouth daily.      [name] HYDROcodone-acetaminophen (VICODIN) 5-500 MG per tablet Take 1 tablet by mouth every 6 (six) hours as needed for pain.     . Isopropyl Alcohol (ALCOHOL PREP PADS EX)     . isosorbide mononitrate (IMDUR) 30 MG 24 hr tablet Take 1 tablet (30 mg total) by mouth daily. 90 tablet 1  . losartan (COZAAR) 100 MG tablet Take 100 mg by mouth daily.      . metFORMIN (GLUCOPHAGE) 1000 MG tablet Take 1 tablet (1,000 mg total) by mouth 2 (two) times daily with a meal.    . metoprolol tartrate (LOPRESSOR) 25 MG tablet Take 0.5 tablets (12.5 mg total) by mouth 2 (two) times daily. 90 tablet 1  . montelukast (SINGULAIR) 10 MG tablet Take 1 tablet by mouth daily.    . nitroGLYCERIN (NITROSTAT) 0.4 MG SL tablet Place 1 tablet (0.4 mg total) under the tongue every 5  (five) minutes x 3 doses as needed for chest pain. 25 tablet 3  . pantoprazole (PROTONIX) 40 MG tablet Take 1 tablet (40 mg total) by mouth daily. 30 tablet 5  . tetrahydrozoline-zinc (VISINE-AC) 0.05-0.25 % ophthalmic solution 2 drops 3 (three) times daily as needed.    . warfarin (COUMADIN) 2 MG tablet Take 2 mg by mouth daily.     No current facility-administered medications for this visit.     REVIEW OF SYSTEMS: See HPI for pertinent positives and negatives.  Physical Examination Filed Vitals:   07/27/14 0955  BP: 142/70  Pulse: 47  Resp: 14  Height: 5\' 10"  (1.778 m)  Weight: 228 lb (103.42 kg)  SpO2: 98%   Body mass index is 32.71 kg/(m^2).  General:  WDWN in obese male in NAD Gait: Normal HENT: WNL Eyes: Pupils equal Pulmonary: normal non-labored breathing , without Rales, rhonchi,  wheezing Cardiac: RRR, no murmur detected  Abdomen: soft, NT, no masses palpated Skin: no rashes, ulcers noted;  no Gangrene , no cellulitis; no open wounds.   VASCULAR EXAM  Carotid Bruits Right Left   Negative Negative    Aorta is not palpable Radial pulses are 2+ palpable and =                         VASCULAR EXAM: Extremities without ischemic changes, without Gangrene; without open wounds.  LE Pulses Right Left       FEMORAL  faintly palpable  2+ palpable        POPLITEAL  not palpable   not palpable       POSTERIOR TIBIAL  not palpable   2+ palpable        DORSALIS PEDIS      ANTERIOR TIBIAL not palpable  2+ palpable      Musculoskeletal: no muscle wasting or atrophy; 2+ pitting edema in right lower leg, 1+ in left lower leg.  Neurologic: A&O X 3; Appropriate Affect ;  SENSATION: normal; MOTOR FUNCTION: 5/5 Symmetric, CN 2-12 intact except for some hearing loss, Speech is fluent/normal    Non-Invasive Vascular Imaging (07/27/2014):  ABDOMINAL AORTA DUPLEX  EVALUATION    INDICATION: Evaluation of abdominal aorta.    PREVIOUS INTERVENTION(S):     DUPLEX EXAM:     LOCATION DIAMETER AP (cm) DIAMETER TRANSVERSE (cm) VELOCITIES (cm/sec)  Aorta Proximal 2.59 2.78   Aorta Mid 3.92 3.89 38  Aorta Distal 4.08 3.57 19  Right Common Iliac Artery Not Visualized  Not Visualized    Left Common Iliac Artery 1.66 1.79 247    Previous max aortic diameter:  4.0 x 4.2 Date: 07/28/2013  ADDITIONAL FINDINGS: Technically difficult exam due to patient body habitus and overlying bowel gas.    IMPRESSION: Patent abdominal aortic aneurysm measuring approximately 4.08 x 3.57cm in diameter.     Compared to the previous exam:  No significant change in comparison to the last exam on 07/28/2013.      LOWER EXTREMITY ARTERIAL DUPLEX EVALUATION    INDICATION: Peripheral vascular disease     PREVIOUS INTERVENTION(S): Right femoral to above knee popliteal bypass graft with Gortex 07/19/1999. Left femoral to below knee popliteal artery bypass graft 04/12/2006.    DUPLEX EXAM:     RIGHT  LEFT   Peak Systolic Velocity (cm/s) Ratio (if abnormal) Waveform  Peak Systolic Velocity (cm/s) Ratio (if abnormal) Waveform  83  B  Inflow Artery 143  B   38  M Proximal Anastomosis 130  B  0   Proximal Graft 72  B  0   Mid Graft 91  B  0    Distal Graft 94  B  0   Distal Anastomosis 134  B  16  M Outflow Artery 55  B  0.69 Today's ABI / TBI 0.95  1.18 Previous ABI / TBI (07/28/2013  ) 1.11    Waveform:    M - Monophasic       B - Biphasic       T - Triphasic  If Ankle Brachial Index (ABI) or Toe Brachial Index (TBI) performed, please see complete report  ADDITIONAL FINDINGS:     IMPRESSION: No color flow or spectral Doppler waveforms were obtained in the right femoropopliteal bypass graft; consistent with occlusion. Patent left lower extremity bypass graft with no evidence of restenosis.     Compared to the previous exam:  Decreased right ABI since last exam, no flow  noted in the bypass graft. Stable left lower extremity ABI.     ASSESSMENT:  Troy [name] is a 74 y.o. male who is being followed for a moderate sized infrarenal abdominal aortic aneurysm and who is also s/p right femoral to above knee popliteal bypass graft with Gortex on 07/19/1999, and left femoral to below knee popliteal artery bypass graft on 04/12/2006.  He has arthritis in his back and has known intermittent radiculopathy in  his right leg from this; this seems to limit his walking as he does not seem to have claudication symptoms. He does not recall any worsening of the pain in his right leg since he was last evaluated in our practice; however, ABI in the right leg has decreased from normal to evidence of moderate arterial occlusive disease. The right femoropopliteal bypass graft is now occluded, was patent with normal velocities a year ago, but he has developed collateral perfusion adequate enough to allow moderate arterial perfusion to his right ankle. The  left lower extremity bypass graft is patent with no evidence of restenosis. He has no signs of ischemia in his lower legs or feet.  Mild/moderate pitting edema in both lower legs secondary to chronic venous insufficiency; he has tried knee high compression hose.  Dr. Donnetta Hutching spoke with pt after I discussed Duplex results, pt exam, and HPI with Dr. Donnetta Hutching.  Face to face time with patient was 25 minutes. Over 50% of this time was spent on counseling and coordination of care.    PLAN:   Graduated walking program and daily seated leg exercises as demonstrated and discussed.  Based on today's exam and non-invasive vascular lab results, the patient will follow up in 1 year with the following tests: bilateral LE arterial Duplex, ABI's, and AAA Duplex. I discussed in depth with the patient the nature of atherosclerosis, and emphasized the importance of maximal medical management including strict control of blood pressure, blood glucose,  and lipid levels, obtaining regular exercise, and cessation of smoking.  The patient is aware that without maximal medical management the underlying atherosclerotic disease process will progress, limiting the benefit of any interventions.  The patient was given information about stroke prevention and what symptoms should prompt the patient to seek immediate medical care.  The patient was given information about PAD including signs, symptoms, treatment, what symptoms should prompt the patient to seek immediate medical care, and risk reduction measures to take. Thank you for allowing [place] to participate in this patient's care.  Clemon Chambers, RN, MSN, FNP-C [facility] Office: [phone]  Clinic MD: Early 07/27/2014 10:21 AM

## 2014-07-27 NOTE — Addendum Note (Signed)
Addended by: Mena Goes on: 07/27/2014 04:18 PM   Modules accepted: Orders

## 2014-07-27 NOTE — Patient Instructions (Signed)
Abdominal Aortic Aneurysm An aneurysm is a weakened or damaged part of an artery wall that bulges from the normal force of blood pumping through the body. An abdominal aortic aneurysm is an aneurysm that occurs in the lower part of the aorta, the main artery of the body.  The major concern with an abdominal aortic aneurysm is that it can enlarge and burst (rupture) or blood can flow between the layers of the wall of the aorta through a tear (aorticdissection). Both of these conditions can cause bleeding inside the body and can be life threatening unless diagnosed and treated promptly. CAUSES  The exact cause of an abdominal aortic aneurysm is unknown. Some contributing factors are:   A hardening of the arteries caused by the buildup of fat and other substances in the lining of a blood vessel (arteriosclerosis).  Inflammation of the walls of an artery (arteritis).   Connective tissue diseases, such as Marfan syndrome.   Abdominal trauma.   An infection, such as syphilis or staphylococcus, in the wall of the aorta (infectious aortitis) caused by bacteria. RISK FACTORS  Risk factors that contribute to an abdominal aortic aneurysm may include:  Age older than 60 years.   High blood pressure (hypertension).  Male gender.  Ethnicity (white race).  Obesity.  Family history of aneurysm (first degree relatives only).  Tobacco use. PREVENTION  The following healthy lifestyle habits may help decrease your risk of abdominal aortic aneurysm:  Quitting smoking. Smoking can raise your blood pressure and cause arteriosclerosis.  Limiting or avoiding alcohol.  Keeping your blood pressure, blood sugar level, and cholesterol levels within normal limits.  Decreasing your salt intake. In somepeople, too much salt can raise blood pressure and increase your risk of abdominal aortic aneurysm.  Eating a diet low in saturated fats and cholesterol.  Increasing your fiber intake by including  whole grains, vegetables, and fruits in your diet. Eating these foods may help lower blood pressure.  Maintaining a healthy weight.  Staying physically active and exercising regularly. SYMPTOMS  The symptoms of abdominal aortic aneurysm may vary depending on the size and rate of growth of the aneurysm.Most grow slowly and do not have any symptoms. When symptoms do occur, they may include:  Pain (abdomen, side, lower back, or groin). The pain may vary in intensity. A sudden onset of severe pain may indicate that the aneurysm has ruptured.  Feeling full after eating only small amounts of food.  Nausea or vomiting or both.  Feeling a pulsating lump in the abdomen.  Feeling faint or passing out. DIAGNOSIS  Since most unruptured abdominal aortic aneurysms have no symptoms, they are often discovered during diagnostic exams for other conditions. An aneurysm may be found during the following procedures:  Ultrasonography (A one-time screening for abdominal aortic aneurysm by ultrasonography is also recommended for all men aged 65-75 years who have ever smoked).  X-ray exams.  A computed tomography (CT).  Magnetic resonance imaging (MRI).  Angiography or arteriography. TREATMENT  Treatment of an abdominal aortic aneurysm depends on the size of your aneurysm, your age, and risk factors for rupture. Medication to control blood pressure and pain may be used to manage aneurysms smaller than 6 cm. Regular monitoring for enlargement may be recommended by your caregiver if:  The aneurysm is 3-4 cm in size (an annual ultrasonography may be recommended).  The aneurysm is 4-4.5 cm in size (an ultrasonography every 6 months may be recommended).  The aneurysm is larger than 4.5 cm in   size (your caregiver may ask that you be examined by a vascular surgeon). If your aneurysm is larger than 6 cm, surgical repair may be recommended. There are two main methods for repair of an aneurysm:   Endovascular  repair (a minimally invasive surgery). This is done most often.  Open repair. This method is used if an endovascular repair is not possible. Document Released: 12/27/2004 Document Revised: 07/14/2012 Document Reviewed: 04/18/2012 ExitCare Patient Information 2015 [employer]. This information is not intended to replace advice given to you by your health care provider. Make sure you discuss any questions you have with your health care provider.   Peripheral Vascular Disease Peripheral Vascular Disease (PVD), also called Peripheral Arterial Disease (PAD), is a circulation problem caused by cholesterol (atherosclerotic plaque) deposits in the arteries. PVD commonly occurs in the lower extremities (legs) but it can occur in other areas of the body, such as your arms. The cholesterol buildup in the arteries reduces blood flow which can cause pain and other serious problems. The presence of PVD can place a person at risk for Coronary Artery Disease (CAD).  CAUSES  Causes of PVD can be many. It is usually associated with more than one risk factor such as:   High Cholesterol.  Smoking.  Diabetes.  Lack of exercise or inactivity.  High blood pressure (hypertension).  Obesity.  Family history. SYMPTOMS   When the lower extremities are affected, patients with PVD may experience:  Leg pain with exertion or physical activity. This is called INTERMITTENT CLAUDICATION. This may present as cramping or numbness with physical activity. The location of the pain is associated with the level of blockage. For example, blockage at the abdominal level (distal abdominal aorta) may result in buttock or hip pain. Lower leg arterial blockage may result in calf pain.  As PVD becomes more severe, pain can develop with less physical activity.  In people with severe PVD, leg pain may occur at rest.  Other PVD signs and symptoms:  Leg numbness or weakness.  Coldness in the affected leg or foot, especially  when compared to the other leg.  A change in leg color.  Patients with significant PVD are more prone to ulcers or sores on toes, feet or legs. These may take longer to heal or may reoccur. The ulcers or sores can become infected.  If signs and symptoms of PVD are ignored, gangrene may occur. This can result in the loss of toes or loss of an entire limb.  Not all leg pain is related to PVD. Other medical conditions can cause leg pain such as:  Blood clots (embolism) or Deep Vein Thrombosis.  Inflammation of the blood vessels (vasculitis).  Spinal stenosis. DIAGNOSIS  Diagnosis of PVD can involve several different types of tests. These can include:  Pulse Volume Recording Method (PVR). This test is simple, painless and does not involve the use of X-rays. PVR involves measuring and comparing the blood pressure in the arms and legs. An ABI (Ankle-Brachial Index) is calculated. The normal ratio of blood pressures is 1. As this number becomes smaller, it indicates more severe disease.  < 0.95 - indicates significant narrowing in one or more leg vessels.  <0.8 - there will usually be pain in the foot, leg or buttock with exercise.  <0.4 - will usually have pain in the legs at rest.  <0.25 - usually indicates limb threatening PVD.  Doppler detection of pulses in the legs. This test is painless and checks to see if   you have a pulses in your legs/feet.  A dye or contrast material (a substance that highlights the blood vessels so they show up on x-ray) may be given to help your caregiver better see the arteries for the following tests. The dye is eliminated from your body by the kidney's. Your caregiver may order blood work to check your kidney function and other laboratory values before the following tests are performed:  Magnetic Resonance Angiography (MRA). An MRA is a picture study of the blood vessels and arteries. The MRA machine uses a large magnet to produce images of the blood  vessels.  Computed Tomography Angiography (CTA). A CTA is a specialized x-ray that looks at how the blood flows in your blood vessels. An IV may be inserted into your arm so contrast dye can be injected.  Angiogram. Is a procedure that uses x-rays to look at your blood vessels. This procedure is minimally invasive, meaning a small incision (cut) is made in your groin. A small tube (catheter) is then inserted into the artery of your groin. The catheter is guided to the blood vessel or artery your caregiver wants to examine. Contrast dye is injected into the catheter. X-rays are then taken of the blood vessel or artery. After the images are obtained, the catheter is taken out. TREATMENT  Treatment of PVD involves many interventions which may include:  Lifestyle changes:  Quitting smoking.  Exercise.  Following a low fat, low cholesterol diet.  Control of diabetes.  Foot care is very important to the PVD patient. Good foot care can help prevent infection.  Medication:  Cholesterol-lowering medicine.  Blood pressure medicine.  Anti-platelet drugs.  Certain medicines may reduce symptoms of Intermittent Claudication.  Interventional/Surgical options:  Angioplasty. An Angioplasty is a procedure that inflates a balloon in the blocked artery. This opens the blocked artery to improve blood flow.  Stent Implant. A wire mesh tube (stent) is placed in the artery. The stent expands and stays in place, allowing the artery to remain open.  Peripheral Bypass Surgery. This is a surgical procedure that reroutes the blood around a blocked artery to help improve blood flow. This type of procedure may be performed if Angioplasty or stent implants are not an option. SEEK IMMEDIATE MEDICAL CARE IF:   You develop pain or numbness in your arms or legs.  Your arm or leg turns cold, becomes blue in color.  You develop redness, warmth, swelling and pain in your arms or legs. MAKE SURE YOU:    Understand these instructions.  Will watch your condition.  Will get help right away if you are not doing well or get worse. Document Released: 04/26/2004 Document Revised: 06/11/2011 Document Reviewed: 03/23/2008 ExitCare Patient Information 2015 [employer]. This information is not intended to replace advice given to you by your health care provider. Make sure you discuss any questions you have with your health care provider.  

## 2014-08-02 ENCOUNTER — Encounter: Payer: Self-pay | Admitting: Internal Medicine

## 2014-08-17 ENCOUNTER — Telehealth: Payer: Self-pay | Admitting: Gastroenterology

## 2014-08-17 NOTE — Telephone Encounter (Signed)
PATIENT NEEDED REFERRAL FROM [facility] AND HAD NOT SEEN THEM IN 2 MONTHS.  TOLD HIM THAT [facility] REQUIRED OFFICE VISIT WITHIN 30 DAYS IN ORDER TO GET A REFERRAL.  HE WILL CALL THEM AND MAKE APPOINTMENT WITH THEM AND THEN WILL GET BACK WITH [place] WITH A REFERRAL.

## 2014-08-19 ENCOUNTER — Ambulatory Visit: Payer: Commercial Managed Care - HMO | Admitting: Gastroenterology

## 2014-08-19 ENCOUNTER — Other Ambulatory Visit (HOSPITAL_COMMUNITY): Payer: Self-pay | Admitting: Family Medicine

## 2014-08-19 ENCOUNTER — Ambulatory Visit (HOSPITAL_COMMUNITY)
Admission: RE | Admit: 2014-08-19 | Discharge: 2014-08-19 | Disposition: A | Discharge status: Home / Self Care | Payer: Commercial Managed Care - HMO | Source: Ambulatory Visit | Attending: Family Medicine | Admitting: Family Medicine

## 2014-08-19 DIAGNOSIS — R0781 Pleurodynia: Secondary | ICD-10-CM | POA: Diagnosis not present

## 2014-08-19 DIAGNOSIS — Z6834 Body mass index (BMI) 34.0-34.9, adult: Secondary | ICD-10-CM | POA: Diagnosis not present

## 2014-08-19 DIAGNOSIS — G894 Chronic pain syndrome: Secondary | ICD-10-CM

## 2014-08-19 DIAGNOSIS — E6609 Other obesity due to excess calories: Secondary | ICD-10-CM | POA: Diagnosis not present

## 2014-08-19 DIAGNOSIS — R05 Cough: Secondary | ICD-10-CM | POA: Diagnosis not present

## 2014-08-24 DIAGNOSIS — Z7901 Long term (current) use of anticoagulants: Secondary | ICD-10-CM | POA: Diagnosis not present

## 2014-08-31 ENCOUNTER — Encounter: Payer: Self-pay | Admitting: Internal Medicine

## 2014-09-07 DIAGNOSIS — H4011X1 Primary open-angle glaucoma, mild stage: Secondary | ICD-10-CM | POA: Diagnosis not present

## 2014-09-07 DIAGNOSIS — H2513 Age-related nuclear cataract, bilateral: Secondary | ICD-10-CM | POA: Diagnosis not present

## 2014-09-07 DIAGNOSIS — E119 Type 2 diabetes mellitus without complications: Secondary | ICD-10-CM | POA: Diagnosis not present

## 2014-09-07 DIAGNOSIS — H538 Other visual disturbances: Secondary | ICD-10-CM | POA: Diagnosis not present

## 2014-09-13 ENCOUNTER — Ambulatory Visit: Payer: Commercial Managed Care - HMO | Admitting: Gastroenterology

## 2014-09-16 ENCOUNTER — Ambulatory Visit (INDEPENDENT_AMBULATORY_CARE_PROVIDER_SITE_OTHER): Payer: Commercial Managed Care - HMO | Admitting: Gastroenterology

## 2014-09-16 ENCOUNTER — Encounter: Payer: Self-pay | Admitting: Gastroenterology

## 2014-09-16 VITALS — BP 126/62 | HR 47 | Temp 97.5°F | Ht 70.0 in | Wt 229.6 lb

## 2014-09-16 DIAGNOSIS — K208 Other esophagitis: Secondary | ICD-10-CM

## 2014-09-16 DIAGNOSIS — R194 Change in bowel habit: Secondary | ICD-10-CM | POA: Diagnosis not present

## 2014-09-16 DIAGNOSIS — K2210 Ulcer of esophagus without bleeding: Secondary | ICD-10-CM

## 2014-09-16 NOTE — Assessment & Plan Note (Signed)
History of erosive esophagitis and benign-appearing peptic stricture. Really no recurrent dysphagia. No significant heartburn. Patient questions whether he can stop pantoprazole. Advised that if he tries cut off the medication, he should resume it if he develops recurrent heartburn on a regular basis (more than 3-4 times per week). Advised that I suspect these will require chronic therapy. Discussed risk of recurrent esophagitis and stricture if has ongoing uncontrolled GERD. Really no problems swallowing at this time. If he notices any change she'll let us know.

## 2014-09-16 NOTE — Assessment & Plan Note (Signed)
Minor changes in bowel habits. Last colonoscopy 2011, diverticulosis. Stools are soft. May not go every day but it may go several times in a row. Add probiotic. Fiber choice 2 daily. Call if bowel function does not improve. If bowel function does not normalize, could consider colonoscopy. Otherwise we'll see him back in January 2017.

## 2014-09-16 NOTE — Progress Notes (Signed)
cc'ed to pcp °

## 2014-09-16 NOTE — Progress Notes (Signed)
Primary Care Physician:  Purvis Kilts, MD  Primary Gastroenterologist:  Garfield Cornea, MD   Chief Complaint  Patient presents with  . Dysphagia    HPI:  Troy [name] is a 74 y.o. male here for further evaluation of esophageal dysphagia. He was last seen in November 2015 for her six-month follow-up. Last EGD in May 2015 with benign-appearing peptic stricture with erosive reflux esophagitis status post 56 French Maloney dilator passed. Stricture was nicely dilated with minimal bleeding. He also had a hiatal hernia, abnormal gastric mucosa, deformity of the pyloric channel suggestive of prior peptic ulcer disease, duodenal bulbar diverticulum. 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 No dysplasia. Subsequently treated for H pylori.  Really no problem swallowing. Sometimes grits have to be washed down. No problems with meds, meats, or bread. Stools are soft. Hard to clean up. May skip couple of days and then when goes may go several times, formed to runny. Used to go about every other day and more solid. No melena, brbpr. No recent antibiotics, no med changes. Sometimes gassy and bloated. Rare abdominal pain. No weight loss. Appetite is been good.    Current Outpatient Prescriptions  Medication Sig Dispense Refill  . amLODipine (NORVASC) 10 MG tablet Take 10 mg by mouth daily.      [name] aspirin EC 81 MG EC tablet Take 1 tablet (81 mg total) by mouth daily. 30 tablet 3  . atorvastatin (LIPITOR) 40 MG tablet Take 1 tablet (40 mg total) by mouth daily. 90 tablet 1  . fexofenadine (ALLEGRA) 180 MG tablet Take 180 mg by mouth daily.    . furosemide (LASIX) 40 MG tablet Take 40 mg by mouth daily.      [name] HYDROcodone-acetaminophen (VICODIN) 5-500 MG per tablet Take 1 tablet by mouth every 6 (six) hours as needed for pain.     . Isopropyl Alcohol (ALCOHOL PREP PADS EX)     . isosorbide mononitrate (IMDUR) 30 MG 24 hr tablet Take 1 tablet (30 mg total) by mouth daily. 90  tablet 1  . losartan (COZAAR) 100 MG tablet Take 100 mg by mouth daily.      . metFORMIN (GLUCOPHAGE) 1000 MG tablet Take 1 tablet (1,000 mg total) by mouth 2 (two) times daily with a meal.    . metoprolol tartrate (LOPRESSOR) 25 MG tablet Take 0.5 tablets (12.5 mg total) by mouth 2 (two) times daily. 90 tablet 1  . montelukast (SINGULAIR) 10 MG tablet Take 1 tablet by mouth daily.    . nitroGLYCERIN (NITROSTAT) 0.4 MG SL tablet Place 1 tablet (0.4 mg total) under the tongue every 5 (five) minutes x 3 doses as needed for chest pain. 25 tablet 3  . pantoprazole (PROTONIX) 40 MG tablet Take 1 tablet (40 mg total) by mouth daily. 30 tablet 5  . tetrahydrozoline-zinc (VISINE-AC) 0.05-0.25 % ophthalmic solution 2 drops 3 (three) times daily as needed.    . warfarin (COUMADIN) 2 MG tablet Take 2 mg by mouth daily.     No current facility-administered medications for this visit.    Allergies as of 09/16/2014 - Review Complete 07/27/2014  Allergen Reaction Noted  . Niacin Itching and Rash     ROS:  General: Negative for anorexia, weight loss, fever, chills, fatigue, weakness. Eyes: Negative for vision changes.  ENT: Negative for hoarseness, difficulty swallowing , nasal congestion. CV: Negative for chest pain, angina, palpitations, dyspnea on exertion, peripheral edema.  Respiratory: Negative for dyspnea at rest,  dyspnea on exertion, cough, sputum, wheezing.  GI: See history of present illness. GU:  Negative for dysuria, hematuria, urinary incontinence, urinary frequency, nocturnal urination.  MS: Negative for joint pain, low back pain.  Derm: Negative for rash or itching.  Neuro: Negative for weakness, abnormal sensation, seizure, frequent headaches, memory loss, confusion.  Psych: Negative for anxiety, depression, suicidal ideation, hallucinations.  Endo: Negative for unusual weight change.  Heme: Negative for bruising or bleeding. Allergy: Negative for rash or hives.    Physical  Examination:  BP 126/62 mmHg  Pulse 47  Temp(Src) 97.5 F (36.4 C) (Oral)  Ht 5\' 10"  (1.778 m)  Wt 229 lb 9.6 oz (104.146 kg)  BMI 32.94 kg/m2   General: Well-nourished, well-developed in no acute distress.  Head: Normocephalic, atraumatic.   Eyes: Conjunctiva pink, no icterus. Mouth: Oropharyngeal mucosa moist and pink , no lesions erythema or exudate. Neck: Supple without thyromegaly, masses, or lymphadenopathy.  Lungs: Clear to auscultation bilaterally.  Heart: Regular rate and rhythm, no murmurs rubs or gallops.  Abdomen: Bowel sounds are normal, nontender, nondistended, no hepatosplenomegaly or masses, no abdominal bruits or    hernia , no rebound or guarding.   Rectal: not performed Extremities: No lower extremity edema. No clubbing or deformities.  Neuro: Alert and oriented x 4 , grossly normal neurologically.  Skin: Warm and dry, no rash or jaundice.   Psych: Alert and cooperative, normal mood and affect.   Imaging Studies: Dg Chest 2 View  08/19/2014   CLINICAL DATA:  Cough.  EXAM: CHEST  2 VIEW  COMPARISON:  None.  FINDINGS: Mediastinum hilar structures normal. Prior CABG. Cardiomegaly with normal pulmonary vascularity. No focal infiltrate. No pleural effusion or pneumothorax. Degenerative changes thoracic spine.  IMPRESSION: 1. Prior CABG.  Stable cardiomegaly. 2. No acute pulmonary disease .   Electronically Signed   By: Marcello Moores  Register   On: 08/19/2014 08:57

## 2014-09-16 NOTE — Patient Instructions (Signed)
1. You can try coming off of pantoprazole if you would like; however, if you have recurrent heartburn/indigestion several times per week, then you will need to resume it. 2. Trial of Fiber Choice chew 2 daily. 3. [employer] or other probiotic per package instructions for next 1 month. 4. If you continue to have problems with bowel irregularity please let me know. 5. Return to the office 04/2015 or sooner if needed.

## 2014-10-01 DIAGNOSIS — Z7901 Long term (current) use of anticoagulants: Secondary | ICD-10-CM | POA: Diagnosis not present

## 2014-10-29 DIAGNOSIS — E782 Mixed hyperlipidemia: Secondary | ICD-10-CM | POA: Diagnosis not present

## 2014-10-29 DIAGNOSIS — Z6834 Body mass index (BMI) 34.0-34.9, adult: Secondary | ICD-10-CM | POA: Diagnosis not present

## 2014-10-29 DIAGNOSIS — E6609 Other obesity due to excess calories: Secondary | ICD-10-CM | POA: Diagnosis not present

## 2014-10-29 DIAGNOSIS — Z1389 Encounter for screening for other disorder: Secondary | ICD-10-CM | POA: Diagnosis not present

## 2014-10-29 DIAGNOSIS — E119 Type 2 diabetes mellitus without complications: Secondary | ICD-10-CM | POA: Diagnosis not present

## 2014-10-29 DIAGNOSIS — I10 Essential (primary) hypertension: Secondary | ICD-10-CM | POA: Diagnosis not present

## 2014-11-18 DIAGNOSIS — Z7901 Long term (current) use of anticoagulants: Secondary | ICD-10-CM | POA: Diagnosis not present

## 2014-12-10 ENCOUNTER — Other Ambulatory Visit: Payer: Self-pay | Admitting: Gastroenterology

## 2014-12-20 ENCOUNTER — Other Ambulatory Visit: Payer: Self-pay | Admitting: Cardiology

## 2014-12-20 DIAGNOSIS — I6523 Occlusion and stenosis of bilateral carotid arteries: Secondary | ICD-10-CM

## 2014-12-21 ENCOUNTER — Encounter: Payer: Self-pay | Admitting: *Deleted

## 2014-12-22 DIAGNOSIS — Z7901 Long term (current) use of anticoagulants: Secondary | ICD-10-CM | POA: Diagnosis not present

## 2014-12-24 ENCOUNTER — Ambulatory Visit (INDEPENDENT_AMBULATORY_CARE_PROVIDER_SITE_OTHER): Payer: Commercial Managed Care - HMO | Admitting: Cardiology

## 2014-12-24 ENCOUNTER — Encounter: Payer: Self-pay | Admitting: Cardiology

## 2014-12-24 VITALS — BP 122/52 | HR 67 | Ht 70.0 in | Wt 232.0 lb

## 2014-12-24 DIAGNOSIS — I4891 Unspecified atrial fibrillation: Secondary | ICD-10-CM | POA: Insufficient documentation

## 2014-12-24 DIAGNOSIS — I25810 Atherosclerosis of coronary artery bypass graft(s) without angina pectoris: Secondary | ICD-10-CM | POA: Diagnosis not present

## 2014-12-24 DIAGNOSIS — I779 Disorder of arteries and arterioles, unspecified: Secondary | ICD-10-CM | POA: Diagnosis not present

## 2014-12-24 DIAGNOSIS — I739 Peripheral vascular disease, unspecified: Secondary | ICD-10-CM

## 2014-12-24 NOTE — Patient Instructions (Signed)
Medication Instructions:  Same-no changes  Labwork: None  Testing/Procedures: None  Follow-Up: Your physician wants you to follow-up in:  6 month f/u with Dr Koneswaren in the [place] office. You will receive a reminder letter in the mail two months in advance. If you don't receive a letter, please call our office to schedule the follow-up appointment.      

## 2014-12-24 NOTE — Assessment & Plan Note (Signed)
Atrial fibrillation is a new diagnosis today. He has no symptoms. The rate is controlled. I cannot document how long he has been in atrial fibrillation. He is already coumadinized for other reasons. Therefore I have decided to not proceed with any further evaluation. I decided not to proceed with cardioversion at this time. It is possible that he may go in and out of atrial fibrillation. In addition, he has no symptoms.

## 2014-12-24 NOTE — Assessment & Plan Note (Signed)
The patient has no coronary disease. His last cath was in 2014. The details are outlined under the problem list. Medical therapy was recommended. He did not require intervention.

## 2014-12-24 NOTE — Progress Notes (Signed)
Cardiology Office Note   Date:  12/24/2014   ID:  Troy [name], DOB Aug 29, 1940, MRN [id number]  PCP:  Troy Kilts, MD  Cardiologist:  Troy Argyle, MD   Chief Complaint  Patient presents with  . Appointment    Follow-up coronary disease      History of Present Illness: Troy [name] is a 74 y.o. male who presents today to follow up coronary disease. He is actually doing well. He is not having any significant chest pain. His peripheral arterial disease is being followed carefully.    Past Medical History  Diagnosis Date  . Edema   . Hypertension   . Renal artery stenosis     50-70%  . Chest pain     precordial. mild chronic .[name]... nonischemic  . Coronary artery disease     a. Nuclear, January, 2008, no ischemia b. Cath 08/2012- 1/4 patent grafts, RCA CTO, no flow-limiting disease, medically managed  . Dyslipidemia   . Hx of CABG 2000  . S/P femoropopliteal bypass surgery     Dr. Donnetta [name]  . Ejection fraction     Normal LV, nuclear, 2008, ( no echo data as July 31, 2010)  . Sinus bradycardia     Asymptomatic  . Carotid artery disease     Doppler 08/10/2010,  no change, RICA 1-44%, LICA 31-54%  . Neck pain 02/2011  . Dizziness 02/2011  . Myocardial infarction 1999  . Diabetes mellitus without complication   . GERD (gastroesophageal reflux disease)     TAKES TUMS & ROLAIDS AS NEEDED  . CAD (coronary artery disease)   . AAA (abdominal aortic aneurysm)     Followed by Dr. Sherren Mocha [name]  . Pneumonia   . Arthritis   . Cancer     Prostate:  Radiation Tx    Past Surgical History  Procedure Laterality Date  . Coronary artery bypass graft  2000  . Wrist surgery      cyst removal  . Pr vein bypass graft,aorto-fem-pop Right 07/19/1999  . Pr vein bypass graft,aorto-fem-pop Left 05/02/2006  . Cardiac catheterization  09/26/2012    1/4 patent bypass (occluded SVG-PDA, SVG-OM, LIMA-LAD), SVG-diagonal patent and fills the diagonal and LAD, distal RCA occlusion with  left to right collateralization, patent circumflex, LAD with no flow-limiting disease and antegrade flow competitively from SVG-diagonal; EF 60-65%  . Colonoscopy  11/30/2009    MGQ:[id number]. next TCS 11/2019  . Esophagogastroduodenoscopy N/A 08/12/2013    [id number] peptic stricture with erosive refluxesophagitis - status post Maloney dilation. Hiatal hernia. Abnormalgastric mucosa. Deformity of the pyloric channel suggestive ofprior peptic ulcer disease. Duodenal bulbar diverticulum Statuspost gastric biopsy. h.pylori  . Savory dilation N/A 08/12/2013    Procedure: SAVORY DILATION;  Surgeon: Troy Dolin, MD;  [facility];  Service: Endoscopy;  Laterality: N/A;  Troy [name] dilation N/A 08/12/2013    Procedure: Troy [name] DILATION;  Surgeon: Troy Dolin, MD;  Location: [facility];  Service: Endoscopy;  Laterality: N/A;  . Left heart catheterization with coronary/graft angiogram N/A 09/26/2012    Procedure: LEFT HEART CATHETERIZATION WITH Troy [name];  Surgeon: Troy Blanks, MD;  Location: [facility] CATH LAB;  Service: Cardiovascular;  Laterality: N/A;    Patient Active Problem List   Diagnosis Date Noted  . Bowel habit changes 09/16/2014  . Erosive esophagitis 02/11/2014  . Abdominal aneurysm without mention of rupture 07/28/2013  . Peripheral vascular disease, unspecified 07/28/2013  . Esophageal dysphagia 07/21/2013  . CAD (coronary artery  disease)   . Type 2 diabetes mellitus 09/27/2012  . Hypertension 09/27/2012  . Atherosclerosis of native arteries of the extremities with intermittent claudication 02/12/2012  . Limb swelling 02/12/2012  . 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 Neck pain   . Dizziness   . Carotid artery disease   . Renal artery stenosis   . S/P femoropopliteal bypass surgery   . Ejection fraction   . Sinus bradycardia   . Hyperlipidemia 01/04/2010  . EDEMA 07/04/2008  . CORONARY ARTERY  BYPASS GRAFT, HX OF 07/04/2008      Current Outpatient Prescriptions  Medication Sig Dispense Refill  . amLODipine (NORVASC) 10 MG tablet Take 10 mg by mouth daily.      [name] aspirin EC 81 MG EC tablet Take 1 tablet (81 mg total) by mouth daily. 30 tablet 3  . atorvastatin (LIPITOR) 40 MG tablet Take 1 tablet (40 mg total) by mouth daily. 90 tablet 1  . fexofenadine (ALLEGRA) 180 MG tablet Take 180 mg by mouth daily.    . furosemide (LASIX) 40 MG tablet Take 40 mg by mouth daily.      [name] HYDROcodone-acetaminophen (VICODIN) 5-500 MG per tablet Take 1 tablet by mouth every 6 (six) hours as needed for pain.     . Isopropyl Alcohol (ALCOHOL PREP PADS EX)     . isosorbide mononitrate (IMDUR) 30 MG 24 hr tablet Take 1 tablet (30 mg total) by mouth daily. 90 tablet 1  . losartan (COZAAR) 100 MG tablet Take 100 mg by mouth daily.      . metFORMIN (GLUCOPHAGE) 1000 MG tablet Take 1 tablet (1,000 mg total) by mouth 2 (two) times daily with a meal.    . metoprolol tartrate (LOPRESSOR) 25 MG tablet Take 0.5 tablets (12.5 mg total) by mouth 2 (two) times daily. 90 tablet 1  . montelukast (SINGULAIR) 10 MG tablet Take 1 tablet by mouth daily.    . nitroGLYCERIN (NITROSTAT) 0.4 MG SL tablet Place 1 tablet (0.4 mg total) under the tongue every 5 (five) minutes x 3 doses as needed for chest pain. 25 tablet 3  . pantoprazole (PROTONIX) 40 MG tablet TAKE 1 TABLET EVERY DAY (Patient taking differently: TAKE 1 TABLET  EVERY DAY) 90 tablet 3  . tetrahydrozoline-zinc (VISINE-AC) 0.05-0.25 % ophthalmic solution 2 drops 3 (three) times daily as needed.    . warfarin (COUMADIN) 2 MG tablet Take 2 mg by mouth daily.     No current facility-administered medications for this visit.    Allergies:   Niacin    Social History:  The patient  reports that he quit smoking about 16 years ago. He has never used smokeless tobacco. He reports that he drinks alcohol. He reports that he does not use illicit drugs.   Family  History:  The patient's family history includes Breast cancer in his sister; Cancer in his brother; Coronary artery disease in an other family member; Deep vein thrombosis in his father; Diabetes in his sister and sister; Heart attack in his brother and daughter; Heart disease in his brother and sister; Hyperlipidemia in his sister; Hypertension in his mother and sister; Lung cancer in his sister and sister. There is no history of Colon cancer or Stroke.    ROS:  Please see the history of present illness.     Patient denies fever, chills, headache, sweats, rash, change in vision, change in hearing, chest pain, cough, nausea or vomiting, urinary symptoms. All other systems are  reviewed and are negative.   PHYSICAL EXAM: VS:  BP 122/52 mmHg  Pulse 67  Ht 5\' 10"  (1.778 m)  Wt 232 lb (105.235 kg)  BMI 33.29 kg/m2 , Patient is oriented to person time and place. Affect is normal. He is here with his wife. Head is atraumatic. Sclera and conjunctiva are normal. There is no jugulovenous distention. Lungs are clear. Respiratory effort is nonlabored. Cardiac exam reveals S1 and S2. The rhythm is irregularly irregular. The abdomen is soft. There is no peripheral edema. There are no musculoskeletal deformities. There are no skin rashes.   EKG:   EKG is done today and reviewed by me. The patient has atrial fibrillation with a controlled rate. This is changed from the past. We have not seen atrial fibrillation previously.   Recent Labs: No results found for requested labs within last 365 days.    Lipid Panel    Component Value Date/Time   CHOL 129 09/25/2012 0618   TRIG 152* 09/25/2012 0618   HDL 31* 09/25/2012 0618   CHOLHDL 4.2 09/25/2012 0618   VLDL 30 09/25/2012 0618   LDLCALC 68 09/25/2012 0618      Wt Readings from Last 3 Encounters:  12/24/14 232 lb (105.235 kg)  09/16/14 229 lb 9.6 oz (104.146 kg)  07/27/14 228 lb (103.42 kg)      Current medicines are reviewed  The patient  understands his medications.     ASSESSMENT AND PLAN:

## 2014-12-24 NOTE — Assessment & Plan Note (Signed)
The patient has significant carotid disease. He is followed carefully with Dopplers.

## 2014-12-27 ENCOUNTER — Ambulatory Visit (HOSPITAL_COMMUNITY)
Admission: RE | Admit: 2014-12-27 | Discharge: 2014-12-27 | Disposition: A | Discharge status: Home / Self Care | Payer: Commercial Managed Care - HMO | Source: Ambulatory Visit | Attending: Cardiovascular Disease | Admitting: Cardiovascular Disease

## 2014-12-27 DIAGNOSIS — I739 Peripheral vascular disease, unspecified: Secondary | ICD-10-CM

## 2014-12-27 DIAGNOSIS — I10 Essential (primary) hypertension: Secondary | ICD-10-CM | POA: Diagnosis not present

## 2014-12-27 DIAGNOSIS — I6523 Occlusion and stenosis of bilateral carotid arteries: Secondary | ICD-10-CM | POA: Diagnosis not present

## 2014-12-27 DIAGNOSIS — E785 Hyperlipidemia, unspecified: Secondary | ICD-10-CM | POA: Diagnosis not present

## 2014-12-27 DIAGNOSIS — E119 Type 2 diabetes mellitus without complications: Secondary | ICD-10-CM | POA: Diagnosis not present

## 2014-12-27 DIAGNOSIS — I779 Disorder of arteries and arterioles, unspecified: Secondary | ICD-10-CM

## 2015-01-13 DIAGNOSIS — Z23 Encounter for immunization: Secondary | ICD-10-CM | POA: Diagnosis not present

## 2015-01-14 DIAGNOSIS — R490 Dysphonia: Secondary | ICD-10-CM | POA: Diagnosis not present

## 2015-01-14 DIAGNOSIS — Z1389 Encounter for screening for other disorder: Secondary | ICD-10-CM | POA: Diagnosis not present

## 2015-01-14 DIAGNOSIS — Z6835 Body mass index (BMI) 35.0-35.9, adult: Secondary | ICD-10-CM | POA: Diagnosis not present

## 2015-01-14 DIAGNOSIS — E6609 Other obesity due to excess calories: Secondary | ICD-10-CM | POA: Diagnosis not present

## 2015-01-14 DIAGNOSIS — J310 Chronic rhinitis: Secondary | ICD-10-CM | POA: Diagnosis not present

## 2015-01-17 DIAGNOSIS — Z6835 Body mass index (BMI) 35.0-35.9, adult: Secondary | ICD-10-CM | POA: Diagnosis not present

## 2015-01-17 DIAGNOSIS — Z1389 Encounter for screening for other disorder: Secondary | ICD-10-CM | POA: Diagnosis not present

## 2015-01-17 DIAGNOSIS — J069 Acute upper respiratory infection, unspecified: Secondary | ICD-10-CM | POA: Diagnosis not present

## 2015-01-17 DIAGNOSIS — J209 Acute bronchitis, unspecified: Secondary | ICD-10-CM | POA: Diagnosis not present

## 2015-01-17 DIAGNOSIS — E6609 Other obesity due to excess calories: Secondary | ICD-10-CM | POA: Diagnosis not present

## 2015-02-02 DIAGNOSIS — J3089 Other allergic rhinitis: Secondary | ICD-10-CM | POA: Diagnosis not present

## 2015-02-02 DIAGNOSIS — K219 Gastro-esophageal reflux disease without esophagitis: Secondary | ICD-10-CM | POA: Diagnosis not present

## 2015-02-08 DIAGNOSIS — Z1389 Encounter for screening for other disorder: Secondary | ICD-10-CM | POA: Diagnosis not present

## 2015-02-08 DIAGNOSIS — E782 Mixed hyperlipidemia: Secondary | ICD-10-CM | POA: Diagnosis not present

## 2015-02-08 DIAGNOSIS — I10 Essential (primary) hypertension: Secondary | ICD-10-CM | POA: Diagnosis not present

## 2015-02-08 DIAGNOSIS — E6609 Other obesity due to excess calories: Secondary | ICD-10-CM | POA: Diagnosis not present

## 2015-02-08 DIAGNOSIS — E118 Type 2 diabetes mellitus with unspecified complications: Secondary | ICD-10-CM | POA: Diagnosis not present

## 2015-02-08 DIAGNOSIS — Z6834 Body mass index (BMI) 34.0-34.9, adult: Secondary | ICD-10-CM | POA: Diagnosis not present

## 2015-02-10 DIAGNOSIS — Z7901 Long term (current) use of anticoagulants: Secondary | ICD-10-CM | POA: Diagnosis not present

## 2015-02-16 ENCOUNTER — Encounter: Payer: Self-pay | Admitting: Internal Medicine

## 2015-03-03 DIAGNOSIS — Z7901 Long term (current) use of anticoagulants: Secondary | ICD-10-CM | POA: Diagnosis not present

## 2015-03-09 DIAGNOSIS — H2513 Age-related nuclear cataract, bilateral: Secondary | ICD-10-CM | POA: Diagnosis not present

## 2015-03-09 DIAGNOSIS — E119 Type 2 diabetes mellitus without complications: Secondary | ICD-10-CM | POA: Diagnosis not present

## 2015-03-11 ENCOUNTER — Encounter: Payer: Self-pay | Admitting: Cardiovascular Disease

## 2015-03-15 DIAGNOSIS — R490 Dysphonia: Secondary | ICD-10-CM | POA: Diagnosis not present

## 2015-03-15 DIAGNOSIS — J3801 Paralysis of vocal cords and larynx, unilateral: Secondary | ICD-10-CM | POA: Diagnosis not present

## 2015-03-21 ENCOUNTER — Other Ambulatory Visit (INDEPENDENT_AMBULATORY_CARE_PROVIDER_SITE_OTHER): Payer: Self-pay | Admitting: Otolaryngology

## 2015-03-21 DIAGNOSIS — J3800 Paralysis of vocal cords and larynx, unspecified: Secondary | ICD-10-CM

## 2015-03-25 ENCOUNTER — Ambulatory Visit (HOSPITAL_COMMUNITY)
Admission: RE | Admit: 2015-03-25 | Discharge: 2015-03-25 | Disposition: A | Discharge status: Home / Self Care | Payer: Commercial Managed Care - HMO | Source: Ambulatory Visit | Attending: Otolaryngology | Admitting: Otolaryngology

## 2015-03-25 DIAGNOSIS — R05 Cough: Secondary | ICD-10-CM | POA: Diagnosis not present

## 2015-03-25 DIAGNOSIS — J90 Pleural effusion, not elsewhere classified: Secondary | ICD-10-CM | POA: Insufficient documentation

## 2015-03-25 DIAGNOSIS — R1310 Dysphagia, unspecified: Secondary | ICD-10-CM | POA: Diagnosis not present

## 2015-03-25 DIAGNOSIS — J3801 Paralysis of vocal cords and larynx, unilateral: Secondary | ICD-10-CM | POA: Insufficient documentation

## 2015-03-25 DIAGNOSIS — Z951 Presence of aortocoronary bypass graft: Secondary | ICD-10-CM | POA: Insufficient documentation

## 2015-03-25 DIAGNOSIS — R932 Abnormal findings on diagnostic imaging of liver and biliary tract: Secondary | ICD-10-CM | POA: Diagnosis not present

## 2015-03-25 DIAGNOSIS — J3800 Paralysis of vocal cords and larynx, unspecified: Secondary | ICD-10-CM

## 2015-03-25 LAB — POCT I-STAT CREATININE: CREATININE: 0.8 mg/dL (ref 0.61–1.24)

## 2015-03-25 MED ORDER — IOHEXOL 300 MG/ML  SOLN
100.0000 mL | Freq: Once | INTRAMUSCULAR | Status: AC | PRN
  Administered 2015-03-25: 100 mL via INTRAVENOUS

## 2015-03-28 DIAGNOSIS — K920 Hematemesis: Secondary | ICD-10-CM | POA: Diagnosis not present

## 2015-03-28 DIAGNOSIS — E6609 Other obesity due to excess calories: Secondary | ICD-10-CM | POA: Diagnosis not present

## 2015-03-28 DIAGNOSIS — Z6835 Body mass index (BMI) 35.0-35.9, adult: Secondary | ICD-10-CM | POA: Diagnosis not present

## 2015-03-28 DIAGNOSIS — Z1389 Encounter for screening for other disorder: Secondary | ICD-10-CM | POA: Diagnosis not present

## 2015-04-12 ENCOUNTER — Other Ambulatory Visit (INDEPENDENT_AMBULATORY_CARE_PROVIDER_SITE_OTHER): Payer: Self-pay | Admitting: Otolaryngology

## 2015-04-12 DIAGNOSIS — J3800 Paralysis of vocal cords and larynx, unspecified: Secondary | ICD-10-CM

## 2015-04-20 DIAGNOSIS — Z7901 Long term (current) use of anticoagulants: Secondary | ICD-10-CM | POA: Diagnosis not present

## 2015-04-22 ENCOUNTER — Ambulatory Visit (HOSPITAL_COMMUNITY)
Admission: RE | Admit: 2015-04-22 | Discharge: 2015-04-22 | Disposition: A | Discharge status: Home / Self Care | Payer: Commercial Managed Care - HMO | Source: Ambulatory Visit | Attending: Otolaryngology | Admitting: Otolaryngology

## 2015-04-22 DIAGNOSIS — H0520 Unspecified exophthalmos: Secondary | ICD-10-CM

## 2015-04-22 DIAGNOSIS — I639 Cerebral infarction, unspecified: Secondary | ICD-10-CM | POA: Insufficient documentation

## 2015-04-22 DIAGNOSIS — G319 Degenerative disease of nervous system, unspecified: Secondary | ICD-10-CM | POA: Insufficient documentation

## 2015-04-22 DIAGNOSIS — M4800 Spinal stenosis, site unspecified: Secondary | ICD-10-CM | POA: Insufficient documentation

## 2015-04-22 DIAGNOSIS — R4702 Dysphasia: Secondary | ICD-10-CM | POA: Diagnosis not present

## 2015-04-22 DIAGNOSIS — J3800 Paralysis of vocal cords and larynx, unspecified: Secondary | ICD-10-CM

## 2015-04-22 MED ORDER — GADOBENATE DIMEGLUMINE 529 MG/ML IV SOLN
20.0000 mL | Freq: Once | INTRAVENOUS | Status: AC | PRN
  Administered 2015-04-22: 20 mL via INTRAVENOUS

## 2015-05-02 DIAGNOSIS — I10 Essential (primary) hypertension: Secondary | ICD-10-CM | POA: Diagnosis not present

## 2015-05-02 DIAGNOSIS — R490 Dysphonia: Secondary | ICD-10-CM | POA: Diagnosis not present

## 2015-05-02 DIAGNOSIS — R1310 Dysphagia, unspecified: Secondary | ICD-10-CM | POA: Diagnosis not present

## 2015-05-02 DIAGNOSIS — J3801 Paralysis of vocal cords and larynx, unilateral: Secondary | ICD-10-CM | POA: Diagnosis not present

## 2015-05-02 DIAGNOSIS — Z7983 Long term (current) use of bisphosphonates: Secondary | ICD-10-CM | POA: Diagnosis not present

## 2015-05-02 DIAGNOSIS — Z87891 Personal history of nicotine dependence: Secondary | ICD-10-CM | POA: Diagnosis not present

## 2015-05-02 DIAGNOSIS — J387 Other diseases of larynx: Secondary | ICD-10-CM | POA: Diagnosis not present

## 2015-05-02 DIAGNOSIS — Z888 Allergy status to other drugs, medicaments and biological substances status: Secondary | ICD-10-CM | POA: Diagnosis not present

## 2015-05-02 DIAGNOSIS — E119 Type 2 diabetes mellitus without complications: Secondary | ICD-10-CM | POA: Diagnosis not present

## 2015-05-02 DIAGNOSIS — Z886 Allergy status to analgesic agent status: Secondary | ICD-10-CM | POA: Diagnosis not present

## 2015-05-02 DIAGNOSIS — R1313 Dysphagia, pharyngeal phase: Secondary | ICD-10-CM | POA: Diagnosis not present

## 2015-05-02 HISTORY — DX: Paralysis of vocal cords and larynx, unilateral: J38.01

## 2015-05-04 DIAGNOSIS — R1313 Dysphagia, pharyngeal phase: Secondary | ICD-10-CM | POA: Insufficient documentation

## 2015-05-04 DIAGNOSIS — J3801 Paralysis of vocal cords and larynx, unilateral: Secondary | ICD-10-CM | POA: Insufficient documentation

## 2015-05-04 DIAGNOSIS — R490 Dysphonia: Secondary | ICD-10-CM | POA: Insufficient documentation

## 2015-05-19 DIAGNOSIS — R490 Dysphonia: Secondary | ICD-10-CM | POA: Diagnosis not present

## 2015-05-19 DIAGNOSIS — K229 Disease of esophagus, unspecified: Secondary | ICD-10-CM | POA: Diagnosis not present

## 2015-05-19 DIAGNOSIS — R1313 Dysphagia, pharyngeal phase: Secondary | ICD-10-CM | POA: Diagnosis not present

## 2015-05-19 DIAGNOSIS — R1310 Dysphagia, unspecified: Secondary | ICD-10-CM | POA: Diagnosis not present

## 2015-05-24 DIAGNOSIS — Z0000 Encounter for general adult medical examination without abnormal findings: Secondary | ICD-10-CM | POA: Diagnosis not present

## 2015-05-24 DIAGNOSIS — E6609 Other obesity due to excess calories: Secondary | ICD-10-CM | POA: Diagnosis not present

## 2015-05-24 DIAGNOSIS — Z1389 Encounter for screening for other disorder: Secondary | ICD-10-CM | POA: Diagnosis not present

## 2015-05-24 DIAGNOSIS — E782 Mixed hyperlipidemia: Secondary | ICD-10-CM | POA: Diagnosis not present

## 2015-05-24 DIAGNOSIS — Z6835 Body mass index (BMI) 35.0-35.9, adult: Secondary | ICD-10-CM | POA: Diagnosis not present

## 2015-05-24 DIAGNOSIS — E119 Type 2 diabetes mellitus without complications: Secondary | ICD-10-CM | POA: Diagnosis not present

## 2015-05-24 DIAGNOSIS — I10 Essential (primary) hypertension: Secondary | ICD-10-CM | POA: Diagnosis not present

## 2015-05-25 DIAGNOSIS — Z1389 Encounter for screening for other disorder: Secondary | ICD-10-CM | POA: Diagnosis not present

## 2015-05-25 DIAGNOSIS — E119 Type 2 diabetes mellitus without complications: Secondary | ICD-10-CM | POA: Diagnosis not present

## 2015-05-25 DIAGNOSIS — Z0000 Encounter for general adult medical examination without abnormal findings: Secondary | ICD-10-CM | POA: Diagnosis not present

## 2015-05-25 DIAGNOSIS — Z7901 Long term (current) use of anticoagulants: Secondary | ICD-10-CM | POA: Diagnosis not present

## 2015-05-30 DIAGNOSIS — Z7901 Long term (current) use of anticoagulants: Secondary | ICD-10-CM | POA: Diagnosis not present

## 2015-06-06 DIAGNOSIS — H401123 Primary open-angle glaucoma, left eye, severe stage: Secondary | ICD-10-CM | POA: Diagnosis not present

## 2015-06-07 DIAGNOSIS — E6609 Other obesity due to excess calories: Secondary | ICD-10-CM | POA: Diagnosis not present

## 2015-06-07 DIAGNOSIS — Z7901 Long term (current) use of anticoagulants: Secondary | ICD-10-CM | POA: Diagnosis not present

## 2015-06-07 DIAGNOSIS — Z1389 Encounter for screening for other disorder: Secondary | ICD-10-CM | POA: Diagnosis not present

## 2015-06-07 DIAGNOSIS — Z6834 Body mass index (BMI) 34.0-34.9, adult: Secondary | ICD-10-CM | POA: Diagnosis not present

## 2015-06-08 ENCOUNTER — Ambulatory Visit (INDEPENDENT_AMBULATORY_CARE_PROVIDER_SITE_OTHER): Payer: Commercial Managed Care - HMO | Admitting: Cardiovascular Disease

## 2015-06-08 ENCOUNTER — Encounter: Payer: Self-pay | Admitting: Cardiovascular Disease

## 2015-06-08 VITALS — BP 134/68 | HR 63 | Ht 70.0 in | Wt 230.0 lb

## 2015-06-08 DIAGNOSIS — I25810 Atherosclerosis of coronary artery bypass graft(s) without angina pectoris: Secondary | ICD-10-CM

## 2015-06-08 DIAGNOSIS — R600 Localized edema: Secondary | ICD-10-CM

## 2015-06-08 DIAGNOSIS — I6523 Occlusion and stenosis of bilateral carotid arteries: Secondary | ICD-10-CM | POA: Diagnosis not present

## 2015-06-08 DIAGNOSIS — I10 Essential (primary) hypertension: Secondary | ICD-10-CM

## 2015-06-08 DIAGNOSIS — I4891 Unspecified atrial fibrillation: Secondary | ICD-10-CM | POA: Diagnosis not present

## 2015-06-08 DIAGNOSIS — I739 Peripheral vascular disease, unspecified: Secondary | ICD-10-CM

## 2015-06-08 DIAGNOSIS — E785 Hyperlipidemia, unspecified: Secondary | ICD-10-CM

## 2015-06-08 NOTE — Patient Instructions (Signed)
Medication Instructions:  Your physician recommends that you continue on your current medications as directed. Please refer to the Current Medication list given to you today.  Labwork: NONE  Testing/Procedures: Your physician has requested that you have an echocardiogram. Echocardiography is a painless test that uses sound waves to create images of your heart. It provides your doctor with information about the size and shape of your heart and how well your heart's chambers and valves are working. This procedure takes approximately one hour. There are no restrictions for this procedure.   Follow-Up: Your physician wants you to follow-up in: 6 MONTHS WITH DR. KONESWARAN. You will receive a reminder letter in the mail two months in advance. If you don't receive a letter, please call our office to schedule the follow-up appointment.  Any Other Special Instructions Will Be Listed Below (If Applicable).  If you need a refill on your cardiac medications before your next appointment, please call your pharmacy. 

## 2015-06-08 NOTE — Progress Notes (Signed)
Patient ID: Troy [name], male   DOB: [birth year], 75 y.o.   MRN: [id number]      SUBJECTIVE: The patient is a 75 year old male who I am meeting for the first time. He is a former patient of Dr. Ron Parker. His wife, Mardene Celeste, is also my patient. He has a history of coronary artery disease and bypass graft surgery. Coronary angiography on 09/26/2012 demonstrated one out of 4 patent bypass grafts. He also has a history of atrial fibrillation , hypertension, dyslipidemia, carotid artery disease, and peripheral vascular disease. Carotid Dopplers in September 2016 demonstrated 40-59% bilateral carotid artery stenosis.  Denies exertional chest pain. He has chronic bilateral leg edema and says this occurred after peripheral vascular surgery. He is followed by Dr. Donnetta Hutching for this. He developed a reaction to contrast after a CT scan of his neck and chest and had some wheezing which she says has improved.   Review of Systems: As per "subjective", otherwise negative.  Allergies  Allergen Reactions  . Niacin Itching and Rash    Burning sensation    Current Outpatient Prescriptions  Medication Sig Dispense Refill  . amLODipine (NORVASC) 10 MG tablet Take 10 mg by mouth daily.      [name] aspirin EC 81 MG EC tablet Take 1 tablet (81 mg total) by mouth daily. 30 tablet 3  . atorvastatin (LIPITOR) 40 MG tablet Take 1 tablet (40 mg total) by mouth daily. 90 tablet 1  . fexofenadine (ALLEGRA) 180 MG tablet Take 180 mg by mouth daily.    . furosemide (LASIX) 40 MG tablet Take 40 mg by mouth daily.      [name] HYDROcodone-acetaminophen (VICODIN) 5-500 MG per tablet Take 1 tablet by mouth every 6 (six) hours as needed for pain.     . Isopropyl Alcohol (ALCOHOL PREP PADS EX)     . isosorbide mononitrate (IMDUR) 30 MG 24 hr tablet Take 1 tablet (30 mg total) by mouth daily. 90 tablet 1  . losartan (COZAAR) 100 MG tablet Take 100 mg by mouth daily.      . metFORMIN (GLUCOPHAGE) 1000 MG tablet Take 1 tablet (1,000 mg total) by  mouth 2 (two) times daily with a meal.    . metoprolol tartrate (LOPRESSOR) 25 MG tablet Take 0.5 tablets (12.5 mg total) by mouth 2 (two) times daily. 90 tablet 1  . nitroGLYCERIN (NITROSTAT) 0.4 MG SL tablet Place 1 tablet (0.4 mg total) under the tongue every 5 (five) minutes x 3 doses as needed for chest pain. 25 tablet 3  . pantoprazole (PROTONIX) 40 MG tablet TAKE 1 TABLET EVERY DAY (Patient taking differently: TAKE 1 TABLET  EVERY DAY) 90 tablet 3  . tetrahydrozoline-zinc (VISINE-AC) 0.05-0.25 % ophthalmic solution 2 drops 3 (three) times daily as needed.    . warfarin (COUMADIN) 2 MG tablet Take 2 mg by mouth daily.    . VENTOLIN HFA 108 (90 Base) MCG/ACT inhaler      No current facility-administered medications for this visit.    Past Medical History  Diagnosis Date  . Edema   . Hypertension   . Renal artery stenosis (HCC)     50-70%  . Chest pain     precordial. mild chronic .[name]... nonischemic  . Coronary artery disease     a. Nuclear, January, 2008, no ischemia b. Cath 08/2012- 1/4 patent grafts, RCA CTO, no flow-limiting disease, medically managed  . Dyslipidemia   . Hx of CABG 2000  . S/P femoropopliteal bypass surgery  Dr. Donnetta Hutching  . Ejection fraction     Normal LV, nuclear, 2008, ( no echo data as July 31, 2010)  . Sinus bradycardia     Asymptomatic  . Carotid artery disease ([place])     Doppler 08/10/2010,  no change, RICA [id number], LICA [id number]  . Neck pain 02/2011  . Dizziness 02/2011  . Myocardial infarction ([place]) 1999  . Diabetes mellitus without complication ([place])   . GERD (gastroesophageal reflux disease)     TAKES TUMS & ROLAIDS AS NEEDED  . CAD (coronary artery disease)   . AAA (abdominal aortic aneurysm) ([place])     Followed by Dr. Curt Jews  . Pneumonia   . Arthritis   . Cancer [facility])     Prostate:  Radiation Tx    Past Surgical History  Procedure Laterality Date  . Coronary artery bypass graft  2000  . Wrist surgery      cyst removal  . Pr vein  bypass graft,aorto-fem-pop Right 07/19/1999  . Pr vein bypass graft,aorto-fem-pop Left 05/02/2006  . Cardiac catheterization  09/26/2012    1/4 patent bypass (occluded SVG-PDA, SVG-OM, LIMA-LAD), SVG-diagonal patent and fills the diagonal and LAD, distal RCA occlusion with left to right collateralization, patent circumflex, LAD with no flow-limiting disease and antegrade flow competitively from SVG-diagonal; EF 60-65%  . Colonoscopy  11/30/2009    [id number]. next TCS 11/2019  . Esophagogastroduodenoscopy N/A 08/12/2013    [id number] peptic stricture with erosive refluxesophagitis - status post Maloney dilation. Hiatal hernia. Abnormalgastric mucosa. Deformity of the pyloric channel suggestive ofprior peptic ulcer disease. Duodenal bulbar diverticulum Statuspost gastric biopsy. h.pylori  . Savory dilation N/A 08/12/2013    Procedure: SAVORY DILATION;  Surgeon: Daneil Dolin, MD;  [facility];  Service: Endoscopy;  Laterality: N/A;  Venia Minks dilation N/A 08/12/2013    Procedure: Venia Minks DILATION;  Surgeon: Daneil Dolin, MD;  Location: [facility];  Service: Endoscopy;  Laterality: N/A;  . Left heart catheterization with coronary/graft angiogram N/A 09/26/2012    Procedure: LEFT HEART CATHETERIZATION WITH Beatrix Fetters;  Surgeon: Burnell Blanks, MD;  Location[facility]ATH LAB;  Service: Cardiovascular;  Laterality: N/A;    Social History   Social History  . Marital Status: Married    Spouse Name: N/A  . Number of Children: N/A  . Years of Education: N/A   Occupational History  . Not on file.   Social History Main Topics  . Smoking status: Former Smoker    Quit date: 04/02/1998  . Smokeless tobacco: Never Used  . Alcohol Use: 0.0 - 1.2 oz/week    0-2 Standard drinks or equivalent per week     Comment: OCCASIONAL  . Drug Use: No  . Sexual Activity: Not on file   Other Topics Concern  . Not on file   Social History Narrative     Filed  Vitals:   06/08/15 1059  BP: 134/68  Pulse: 63  Height: 5\' 10"  (1.778 m)  Weight: 230 lb (104.327 kg)  SpO2: 92%    PHYSICAL EXAM General: NAD HEENT: Normal. Neck: No JVD, no thyromegaly. Lungs: Clear to auscultation bilaterally with normal respiratory effort. CV: Nondisplaced PMI.  Regular rate and rhythm, normal S1/S2, no S3/S4, no murmur. 1-2+ pitting pretibial edema, left>right.  No carotid bruit.   Abdomen: Soft, nontender, obese.  Neurologic: Alert and oriented.  Psych: Normal affect. Skin: Normal. Musculoskeletal: No gross deformities.  ECG: Most recent ECG reviewed.      ASSESSMENT AND  PLAN: 1. CAD with CABG and 1/4 patent bypass grafts in 08/2012: Continue ASA, statin, Imdur, and metoprolol. I will order a 2-D echocardiogram with Doppler to evaluate cardiac structure, function, and regional wall motion.  2. Paroxysmal atrial fibrillation: Continue therapy with metoprolol and warfarin. Currently in a regular rhythm.  3. Essential HTN: Controlled. No changes.  4. Bilateral carotid artery stenosis: Dopplers from 12/2014 reviewed above. Repeat in 12/2015. Continue ASA and statin.  5. PVD: Followed by vascular surgery.  6. Bilateral leg edema: Remains on Lasix. I will order a 2-D echocardiogram with Doppler to evaluate cardiac structure, function, and regional wall motion.  Dispo: f/u 6 months.  Kate Sable, M.D., F.A.C.C.

## 2015-06-10 DIAGNOSIS — J3801 Paralysis of vocal cords and larynx, unilateral: Secondary | ICD-10-CM | POA: Diagnosis not present

## 2015-06-10 DIAGNOSIS — I10 Essential (primary) hypertension: Secondary | ICD-10-CM | POA: Diagnosis not present

## 2015-06-10 DIAGNOSIS — J984 Other disorders of lung: Secondary | ICD-10-CM | POA: Diagnosis not present

## 2015-06-10 DIAGNOSIS — I4891 Unspecified atrial fibrillation: Secondary | ICD-10-CM | POA: Diagnosis not present

## 2015-06-10 DIAGNOSIS — K222 Esophageal obstruction: Secondary | ICD-10-CM | POA: Diagnosis not present

## 2015-06-10 DIAGNOSIS — E785 Hyperlipidemia, unspecified: Secondary | ICD-10-CM | POA: Diagnosis not present

## 2015-06-10 DIAGNOSIS — I2510 Atherosclerotic heart disease of native coronary artery without angina pectoris: Secondary | ICD-10-CM | POA: Diagnosis not present

## 2015-06-10 DIAGNOSIS — R0683 Snoring: Secondary | ICD-10-CM | POA: Diagnosis not present

## 2015-06-10 DIAGNOSIS — C61 Malignant neoplasm of prostate: Secondary | ICD-10-CM | POA: Insufficient documentation

## 2015-06-10 DIAGNOSIS — Z6832 Body mass index (BMI) 32.0-32.9, adult: Secondary | ICD-10-CM | POA: Diagnosis not present

## 2015-06-10 DIAGNOSIS — G4730 Sleep apnea, unspecified: Secondary | ICD-10-CM | POA: Diagnosis not present

## 2015-06-10 DIAGNOSIS — I701 Atherosclerosis of renal artery: Secondary | ICD-10-CM | POA: Diagnosis not present

## 2015-06-10 DIAGNOSIS — E119 Type 2 diabetes mellitus without complications: Secondary | ICD-10-CM | POA: Diagnosis not present

## 2015-06-10 DIAGNOSIS — E669 Obesity, unspecified: Secondary | ICD-10-CM | POA: Diagnosis not present

## 2015-06-10 DIAGNOSIS — K2210 Ulcer of esophagus without bleeding: Secondary | ICD-10-CM | POA: Diagnosis not present

## 2015-06-13 DIAGNOSIS — K222 Esophageal obstruction: Secondary | ICD-10-CM | POA: Diagnosis not present

## 2015-06-13 DIAGNOSIS — K2210 Ulcer of esophagus without bleeding: Secondary | ICD-10-CM | POA: Diagnosis not present

## 2015-06-13 DIAGNOSIS — E785 Hyperlipidemia, unspecified: Secondary | ICD-10-CM | POA: Diagnosis not present

## 2015-06-13 DIAGNOSIS — E119 Type 2 diabetes mellitus without complications: Secondary | ICD-10-CM | POA: Diagnosis not present

## 2015-06-13 DIAGNOSIS — I4891 Unspecified atrial fibrillation: Secondary | ICD-10-CM | POA: Diagnosis not present

## 2015-06-13 DIAGNOSIS — R1310 Dysphagia, unspecified: Secondary | ICD-10-CM | POA: Diagnosis not present

## 2015-06-13 DIAGNOSIS — I2510 Atherosclerotic heart disease of native coronary artery without angina pectoris: Secondary | ICD-10-CM | POA: Diagnosis not present

## 2015-06-13 DIAGNOSIS — I701 Atherosclerosis of renal artery: Secondary | ICD-10-CM | POA: Diagnosis not present

## 2015-06-13 DIAGNOSIS — I10 Essential (primary) hypertension: Secondary | ICD-10-CM | POA: Diagnosis not present

## 2015-06-13 DIAGNOSIS — J3801 Paralysis of vocal cords and larynx, unilateral: Secondary | ICD-10-CM | POA: Diagnosis not present

## 2015-06-20 DIAGNOSIS — H401231 Low-tension glaucoma, bilateral, mild stage: Secondary | ICD-10-CM | POA: Diagnosis not present

## 2015-06-21 ENCOUNTER — Ambulatory Visit (HOSPITAL_COMMUNITY)
Admission: RE | Admit: 2015-06-21 | Discharge: 2015-06-21 | Disposition: A | Discharge status: Home / Self Care | Payer: Commercial Managed Care - HMO | Source: Ambulatory Visit | Attending: Cardiovascular Disease | Admitting: Cardiovascular Disease

## 2015-06-21 DIAGNOSIS — I10 Essential (primary) hypertension: Secondary | ICD-10-CM | POA: Insufficient documentation

## 2015-06-21 DIAGNOSIS — I25810 Atherosclerosis of coronary artery bypass graft(s) without angina pectoris: Secondary | ICD-10-CM | POA: Diagnosis not present

## 2015-06-21 DIAGNOSIS — E119 Type 2 diabetes mellitus without complications: Secondary | ICD-10-CM | POA: Insufficient documentation

## 2015-06-21 DIAGNOSIS — Z951 Presence of aortocoronary bypass graft: Secondary | ICD-10-CM | POA: Insufficient documentation

## 2015-06-21 DIAGNOSIS — E785 Hyperlipidemia, unspecified: Secondary | ICD-10-CM | POA: Insufficient documentation

## 2015-06-21 DIAGNOSIS — I4891 Unspecified atrial fibrillation: Secondary | ICD-10-CM | POA: Insufficient documentation

## 2015-07-19 DIAGNOSIS — Z6833 Body mass index (BMI) 33.0-33.9, adult: Secondary | ICD-10-CM | POA: Diagnosis not present

## 2015-07-19 DIAGNOSIS — Z7901 Long term (current) use of anticoagulants: Secondary | ICD-10-CM | POA: Diagnosis not present

## 2015-07-19 DIAGNOSIS — E6609 Other obesity due to excess calories: Secondary | ICD-10-CM | POA: Diagnosis not present

## 2015-07-19 DIAGNOSIS — Z1389 Encounter for screening for other disorder: Secondary | ICD-10-CM | POA: Diagnosis not present

## 2015-07-19 DIAGNOSIS — M546 Pain in thoracic spine: Secondary | ICD-10-CM | POA: Diagnosis not present

## 2015-07-28 ENCOUNTER — Encounter: Payer: Self-pay | Admitting: Vascular Surgery

## 2015-08-02 ENCOUNTER — Ambulatory Visit (INDEPENDENT_AMBULATORY_CARE_PROVIDER_SITE_OTHER)
Admission: RE | Admit: 2015-08-02 | Discharge: 2015-08-02 | Disposition: A | Discharge status: Home / Self Care | Payer: Commercial Managed Care - HMO | Source: Ambulatory Visit | Attending: Family | Admitting: Family

## 2015-08-02 ENCOUNTER — Encounter: Payer: Self-pay | Admitting: Family

## 2015-08-02 ENCOUNTER — Other Ambulatory Visit: Payer: Self-pay | Admitting: Family

## 2015-08-02 ENCOUNTER — Ambulatory Visit (INDEPENDENT_AMBULATORY_CARE_PROVIDER_SITE_OTHER): Payer: Commercial Managed Care - HMO | Admitting: Family

## 2015-08-02 ENCOUNTER — Ambulatory Visit (HOSPITAL_COMMUNITY)
Admission: RE | Admit: 2015-08-02 | Discharge: 2015-08-02 | Disposition: A | Discharge status: Home / Self Care | Payer: Commercial Managed Care - HMO | Source: Ambulatory Visit | Attending: Family | Admitting: Family

## 2015-08-02 VITALS — BP 136/74 | HR 54 | Ht 70.0 in | Wt 218.0 lb

## 2015-08-02 DIAGNOSIS — Z95828 Presence of other vascular implants and grafts: Secondary | ICD-10-CM

## 2015-08-02 DIAGNOSIS — I739 Peripheral vascular disease, unspecified: Secondary | ICD-10-CM

## 2015-08-02 DIAGNOSIS — E119 Type 2 diabetes mellitus without complications: Secondary | ICD-10-CM | POA: Insufficient documentation

## 2015-08-02 DIAGNOSIS — I10 Essential (primary) hypertension: Secondary | ICD-10-CM | POA: Diagnosis not present

## 2015-08-02 DIAGNOSIS — Z4889 Encounter for other specified surgical aftercare: Secondary | ICD-10-CM

## 2015-08-02 DIAGNOSIS — E1151 Type 2 diabetes mellitus with diabetic peripheral angiopathy without gangrene: Secondary | ICD-10-CM

## 2015-08-02 DIAGNOSIS — I714 Abdominal aortic aneurysm, without rupture, unspecified: Secondary | ICD-10-CM

## 2015-08-02 DIAGNOSIS — Z48812 Encounter for surgical aftercare following surgery on the circulatory system: Secondary | ICD-10-CM

## 2015-08-02 DIAGNOSIS — I70202 Unspecified atherosclerosis of native arteries of extremities, left leg: Secondary | ICD-10-CM | POA: Diagnosis not present

## 2015-08-02 DIAGNOSIS — Y832 Surgical operation with anastomosis, bypass or graft as the cause of abnormal reaction of the patient, or of later complication, without mention of misadventure at the time of the procedure: Secondary | ICD-10-CM | POA: Diagnosis not present

## 2015-08-02 DIAGNOSIS — E785 Hyperlipidemia, unspecified: Secondary | ICD-10-CM | POA: Insufficient documentation

## 2015-08-02 DIAGNOSIS — I872 Venous insufficiency (chronic) (peripheral): Secondary | ICD-10-CM

## 2015-08-02 DIAGNOSIS — T82898A Other specified complication of vascular prosthetic devices, implants and grafts, initial encounter: Secondary | ICD-10-CM | POA: Insufficient documentation

## 2015-08-02 DIAGNOSIS — R0989 Other specified symptoms and signs involving the circulatory and respiratory systems: Secondary | ICD-10-CM | POA: Diagnosis present

## 2015-08-02 DIAGNOSIS — K219 Gastro-esophageal reflux disease without esophagitis: Secondary | ICD-10-CM | POA: Diagnosis not present

## 2015-08-02 DIAGNOSIS — Z87891 Personal history of nicotine dependence: Secondary | ICD-10-CM | POA: Diagnosis not present

## 2015-08-02 NOTE — Progress Notes (Signed)
[facility] [place] HISTORY AND PHYSICAL   MRN : [id number]  History of Present Illness:   Troy [name] is a 75 y.o. male patient of Dr. 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 He does have a known moderate sized infrarenal abdominal aortic aneurysm is seen for that. He is also status post bilateral extremity bypasses about 2000 and is here for followup of these as well.  He has had no recent worsening of his heart disease. He has no claudication symptoms and no symptoms referable to his aneurysm. He has tried knee high compression hose for lower legs swelling during the day and he states they make his legs hurt and swell more, he has no swelling in his legs in the morning. 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 He denies any history of stroke or TIA.  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   Pt Diabetic: Yes, states his A1C was 6.0, in good control Pt smoker: former smoker, quit in 1999  Pt meds include: Statin :Yes Betablocker: Yes ASA: Yes Other anticoagulants/antiplatelets: on coumadin due to recurrent right leg arterial stenoses per pt     Current Outpatient Prescriptions  Medication Sig Dispense Refill  . amLODipine (NORVASC) 10 MG tablet Take 10 mg by mouth daily.      [name] aspirin EC 81 MG EC tablet Take 1 tablet (81 mg total) by mouth daily. 30 tablet 3  . atorvastatin (LIPITOR) 40 MG tablet Take 1 tablet (40 mg total) by mouth daily. 90 tablet 1  . fexofenadine (ALLEGRA) 180 MG tablet Take 180 mg by mouth daily.    . furosemide (LASIX) 40 MG tablet Take 40 mg by mouth daily.      [name] HYDROcodone-acetaminophen (VICODIN) 5-500 MG per tablet Take 1 tablet by mouth every 6 (six)  hours as needed for pain.     . Isopropyl Alcohol (ALCOHOL PREP PADS EX)     . isosorbide mononitrate (IMDUR) 30 MG 24 hr tablet Take 1 tablet (30 mg total) by mouth daily. 90 tablet 1  . losartan (COZAAR) 100 MG tablet Take 100 mg by mouth daily.      . metFORMIN (GLUCOPHAGE) 1000 MG tablet Take 1 tablet (1,000 mg total) by mouth 2 (two) times daily with a meal.    . metoprolol tartrate (LOPRESSOR) 25 MG tablet Take 0.5 tablets (12.5 mg total) by mouth 2 (two) times daily. 90 tablet 1  . nitroGLYCERIN (NITROSTAT) 0.4 MG SL tablet Place 1 tablet (0.4 mg total) under the tongue every 5 (five) minutes x 3 doses as needed for chest pain. 25 tablet 3  . pantoprazole (PROTONIX) 40 MG tablet TAKE 1 TABLET EVERY DAY (Patient taking differently: TAKE 1 TABLET  EVERY DAY) 90 tablet 3  . tetrahydrozoline-zinc (VISINE-AC) 0.05-0.25 % ophthalmic solution 2 drops 3 (three) times daily as needed.    . VENTOLIN HFA 108 (90 Base) MCG/ACT inhaler     . warfarin (COUMADIN) 2 MG tablet Take 2 mg by mouth daily.     No current facility-administered medications for this visit.    Past Medical History  Diagnosis Date  . Edema   . Hypertension   . Renal artery stenosis ([place])  50-70%  . Chest pain     precordial. mild chronic .[name]... nonischemic  . Coronary artery disease     a. Nuclear, January, 2008, no ischemia b. Cath 08/2012- 1/4 patent grafts, RCA CTO, no flow-limiting disease, medically managed  . Dyslipidemia   . Hx of CABG 2000  . S/P femoropopliteal bypass surgery     Dr. Donnetta Hutching  . Ejection fraction     Normal LV, nuclear, 2008, ( no echo data as July 31, 2010)  . Sinus bradycardia     Asymptomatic  . Carotid artery disease ([place])     Doppler 08/10/2010,  no change, RICA [id number], LICA [id number]  . Neck pain 02/2011  . Dizziness 02/2011  . Myocardial infarction ([place]) 1999  . Diabetes mellitus without complication ([place])   . GERD (gastroesophageal reflux disease)     TAKES TUMS & ROLAIDS AS NEEDED   . CAD (coronary artery disease)   . AAA (abdominal aortic aneurysm) ([place])     Followed by Dr. Curt Jews  . Pneumonia   . Arthritis   . Cancer [facility])     Prostate:  Radiation Tx    Social History Social History  Substance Use Topics  . Smoking status: Former Smoker    Quit date: 04/02/1998  . Smokeless tobacco: Never Used  . Alcohol Use: 0.0 - 1.2 oz/week    0-2 Standard drinks or equivalent per week     Comment: OCCASIONAL    Family History Family History  Problem Relation Age of Onset  . Coronary artery disease      family hx of  . Deep vein thrombosis Father   . Lung cancer Sister   . Diabetes Sister   . Heart disease Sister     After age 4  . Hyperlipidemia Sister   . Hypertension Sister   . Cancer Brother     "crab cancer"  . Heart disease Brother   . Heart attack Brother   . Lung cancer Sister   . Breast cancer Sister   . Hypertension Mother   . Colon cancer Neg Hx   . Heart attack Daughter   . Diabetes Sister   . Stroke Neg Hx     Surgical History Past Surgical History  Procedure Laterality Date  . Coronary artery bypass graft  2000  . Wrist surgery      cyst removal  . Pr vein bypass graft,aorto-fem-pop Right 07/19/1999  . Pr vein bypass graft,aorto-fem-pop Left 05/02/2006  . Cardiac catheterization  09/26/2012    1/4 patent bypass (occluded SVG-PDA, SVG-OM, LIMA-LAD), SVG-diagonal patent and fills the diagonal and LAD, distal RCA occlusion with left to right collateralization, patent circumflex, LAD with no flow-limiting disease and antegrade flow competitively from SVG-diagonal; EF 60-65%  . Colonoscopy  11/30/2009    [id number]. next TCS 11/2019  . Esophagogastroduodenoscopy N/A 08/12/2013    [id number] peptic stricture with erosive refluxesophagitis - status post Maloney dilation. Hiatal hernia. Abnormalgastric mucosa. Deformity of the pyloric channel suggestive ofprior peptic ulcer disease. Duodenal bulbar diverticulum Statuspost  gastric biopsy. h.pylori  . Savory dilation N/A 08/12/2013    Procedure: SAVORY DILATION;  Surgeon: Daneil Dolin, MD;  Location: [facility];  Service: Endoscopy;  Laterality: N/A;  Venia Minks dilation N/A 08/12/2013    Procedure: Venia Minks DILATION;  Surgeon: Daneil Dolin, MD;  Location: [facility];  Service: Endoscopy;  Laterality: N/A;  . Left heart catheterization with coronary/graft angiogram N/A 09/26/2012    Procedure: LEFT HEART CATHETERIZATION WITH  Beatrix Fetters;  Surgeon: Burnell Blanks, MD;  Location: [facility] CATH LAB;  Service: Cardiovascular;  Laterality: N/A;    Allergies  Allergen Reactions  . Niacin Itching and Rash    Burning sensation  . Iodine-131 Nausea And Vomiting  . Nsaids Other (See Comments)    Taking Coumadin    Current Outpatient Prescriptions  Medication Sig Dispense Refill  . amLODipine (NORVASC) 10 MG tablet Take 10 mg by mouth daily.      [name] aspirin EC 81 MG EC tablet Take 1 tablet (81 mg total) by mouth daily. 30 tablet 3  . atorvastatin (LIPITOR) 40 MG tablet Take 1 tablet (40 mg total) by mouth daily. 90 tablet 1  . fexofenadine (ALLEGRA) 180 MG tablet Take 180 mg by mouth daily.    . furosemide (LASIX) 40 MG tablet Take 40 mg by mouth daily.      [name] HYDROcodone-acetaminophen (VICODIN) 5-500 MG per tablet Take 1 tablet by mouth every 6 (six) hours as needed for pain.     . Isopropyl Alcohol (ALCOHOL PREP PADS EX)     . isosorbide mononitrate (IMDUR) 30 MG 24 hr tablet Take 1 tablet (30 mg total) by mouth daily. 90 tablet 1  . losartan (COZAAR) 100 MG tablet Take 100 mg by mouth daily.      . metFORMIN (GLUCOPHAGE) 1000 MG tablet Take 1 tablet (1,000 mg total) by mouth 2 (two) times daily with a meal.    . metoprolol tartrate (LOPRESSOR) 25 MG tablet Take 0.5 tablets (12.5 mg total) by mouth 2 (two) times daily. 90 tablet 1  . nitroGLYCERIN (NITROSTAT) 0.4 MG SL tablet Place 1 tablet (0.4 mg total) under the tongue every 5 (five) minutes  x 3 doses as needed for chest pain. 25 tablet 3  . pantoprazole (PROTONIX) 40 MG tablet TAKE 1 TABLET EVERY DAY (Patient taking differently: TAKE 1 TABLET  EVERY DAY) 90 tablet 3  . tetrahydrozoline-zinc (VISINE-AC) 0.05-0.25 % ophthalmic solution 2 drops 3 (three) times daily as needed.    . VENTOLIN HFA 108 (90 Base) MCG/ACT inhaler     . warfarin (COUMADIN) 2 MG tablet Take 2 mg by mouth daily.     No current facility-administered medications for this visit.     REVIEW OF SYSTEMS: See HPI for pertinent positives and negatives.  Physical Examination Filed Vitals:   08/02/15 0948  BP: 136/74  Pulse: 54  Height: 5\' 10"  (1.778 m)  Weight: 218 lb (98.884 kg)  SpO2: 96%   Body mass index is 31.28 kg/(m^2).  General: WDWN in obese male in NAD Gait: Normal HENT: WNL Eyes: Pupils equal Pulmonary: normal non-labored breathing, diminished air movement in all fields, no adventitious sounds  Cardiac: RRR, no murmur detected  Abdomen: soft, NT, no masses palpated Skin: no rashes, no ulcers, no cellulitis   VASCULAR EXAM  Carotid Bruits Right Left   Negative Negative   Aorta is not palpable Radial pulses are 2+ palpable and =   VASCULAR EXAM: Extremities without ischemic changes, without Gangrene; without open wounds.     LE Pulses Right Left   FEMORAL not palpable 2+ palpable    POPLITEAL not palpable  not palpable   POSTERIOR TIBIAL not palpable  2+ palpable    DORSALIS PEDIS  ANTERIOR TIBIAL not palpable  1+ palpable     Musculoskeletal: no muscle wasting or atrophy; trace pitting edema in right lower leg,  1+ pitting and non pitting edema in left lower leg. 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 Diffuse disease in the bilateral CIA. Last exam mentioning tandem aneurysm was in 2013 with similar measurements of both dilations. Comparison to 2016 report is likely inaccurate.   Left LE Arterial Duplex: Widely patent left fem-pop graft with evidence of 50-74% native disease in CFA inflow (highest velocity is 342 cm/s)  ABI:  R: [place] (0.69, 07/27/14), DP: monophasic, PT: monophasic, TBI: 0.54  L: 1.20 (0.95), DP: triphasic, PT: triphasic, TBI: 0.88   ASSESSMENT:  AZAYAH BRAGG is a 75 y.o. male who is being followed for a moderate sized infrarenal abdominal aortic aneurysm and who is also s/p right femoral to above knee popliteal bypass graft with Gortex on 07/19/1999, and left femoral to below knee popliteal artery bypass graft on 04/12/2006.  He has a known occlusion of the right fem-pop bypass graft. He has no symptoms referable to his aneurysm.  Today's AAA duplex indicates tandem abdominal aortic aneurysm with the proximal fusiform aneurysm measuring 4.02 cm x 3.98 cm and the distal saccular aneurysm measuring 4.44 cm x 3.58 cm. Diffuse disease in the bilateral CIA. Last exam mentioning tandem aneurysm was in 2013 with similar measurements of both dilations. Comparison to 2016 report is likely inaccurate.    Today's left LE arterial duplex suggests a widely patent graft with evidence of 50-74% native disease in CFA inflow (highest velocity is 342 cm/s). ABI's remain monophasic and non compressible in the right and improved from bi to triphasic in the left with normal ABI.  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   Chronic venous insufficiency: He has tried knee high compression hose for lower legs swelling during the day and he states they make his legs hurt and swell more, he has no swelling in his legs in the morning. Today he has minimal edema in his lower legs.   His atherosclerotic risk factors include stable and well controlled DM, former smoker, CAD, dyslipidemia, and obesity. His hypertension is well controlled.   PLAN:   Continue graduated walking program. Based on today's exam and non-invasive vascular lab results, the patient will follow up in 1 year with the following tests: left LE arterial Duplex, ABI's, and AAA Duplex. I discussed in depth with the patient the nature of atherosclerosis, and emphasized the importance of maximal medical management including strict control of blood pressure, blood glucose, and lipid levels, obtaining regular exercise, and cessation of smoking.  The patient is aware that without maximal medical management the underlying atherosclerotic disease process will progress, limiting the benefit of any interventions. Consideration for repair of AAA would be made when the size approaches 4.8 or 5.0 cm, growth > 1 cm/yr, and symptomatic status.  The patient was given information about AAA including signs, symptoms, treatment,  what symptoms should prompt the patient to seek immediate medical care, and how to minimize the risk of enlargement and rupture of aneurysms. The patient was given information about PAD including signs, symptoms, treatment, what symptoms should prompt the patient to  seek immediate medical care, and risk reduction measures to take. Thank you for allowing [place] to participate in this patient's care.  Clemon Chambers, RN, MSN, FNP-C [facility] Office: [phone]  Clinic MD: Early 08/02/2015 10:12 AM

## 2015-08-02 NOTE — Patient Instructions (Signed)
Abdominal Aortic Aneurysm An aneurysm is a weakened or damaged part of an artery wall that bulges from the normal force of blood pumping through the body. An abdominal aortic aneurysm is an aneurysm that occurs in the lower part of the aorta, the main artery of the body.  The major concern with an abdominal aortic aneurysm is that it can enlarge and burst (rupture) or blood can flow between the layers of the wall of the aorta through a tear (aorticdissection). Both of these conditions can cause bleeding inside the body and can be life threatening unless diagnosed and treated promptly. CAUSES  The exact cause of an abdominal aortic aneurysm is unknown. Some contributing factors are:   A hardening of the arteries caused by the buildup of fat and other substances in the lining of a blood vessel (arteriosclerosis).  Inflammation of the walls of an artery (arteritis).   Connective tissue diseases, such as Marfan syndrome.   Abdominal trauma.   An infection, such as syphilis or staphylococcus, in the wall of the aorta (infectious aortitis) caused by bacteria. RISK FACTORS  Risk factors that contribute to an abdominal aortic aneurysm may include:  Age older than 60 years.   High blood pressure (hypertension).  Male gender.  Ethnicity (white race).  Obesity.  Family history of aneurysm (first degree relatives only).  Tobacco use. PREVENTION  The following healthy lifestyle habits may help decrease your risk of abdominal aortic aneurysm:  Quitting smoking. Smoking can raise your blood pressure and cause arteriosclerosis.  Limiting or avoiding alcohol.  Keeping your blood pressure, blood sugar level, and cholesterol levels within normal limits.  Decreasing your salt intake. In somepeople, too much salt can raise blood pressure and increase your risk of abdominal aortic aneurysm.  Eating a diet low in saturated fats and cholesterol.  Increasing your fiber intake by including  whole grains, vegetables, and fruits in your diet. Eating these foods may help lower blood pressure.  Maintaining a healthy weight.  Staying physically active and exercising regularly. SYMPTOMS  The symptoms of abdominal aortic aneurysm may vary depending on the size and rate of growth of the aneurysm.Most grow slowly and do not have any symptoms. When symptoms do occur, they may include:  Pain (abdomen, side, lower back, or groin). The pain may vary in intensity. A sudden onset of severe pain may indicate that the aneurysm has ruptured.  Feeling full after eating only small amounts of food.  Nausea or vomiting or both.  Feeling a pulsating lump in the abdomen.  Feeling faint or passing out. DIAGNOSIS  Since most unruptured abdominal aortic aneurysms have no symptoms, they are often discovered during diagnostic exams for other conditions. An aneurysm may be found during the following procedures:  Ultrasonography (A one-time screening for abdominal aortic aneurysm by ultrasonography is also recommended for all men aged 65-75 years who have ever smoked).  X-ray exams.  A computed tomography (CT).  Magnetic resonance imaging (MRI).  Angiography or arteriography. TREATMENT  Treatment of an abdominal aortic aneurysm depends on the size of your aneurysm, your age, and risk factors for rupture. Medication to control blood pressure and pain may be used to manage aneurysms smaller than 6 cm. Regular monitoring for enlargement may be recommended by your caregiver if:  The aneurysm is 3-4 cm in size (an annual ultrasonography may be recommended).  The aneurysm is 4-4.5 cm in size (an ultrasonography every 6 months may be recommended).  The aneurysm is larger than 4.5 cm in   size (your caregiver may ask that you be examined by a vascular surgeon). If your aneurysm is larger than 6 cm, surgical repair may be recommended. There are two main methods for repair of an aneurysm:   Endovascular  repair (a minimally invasive surgery). This is done most often.  Open repair. This method is used if an endovascular repair is not possible.   This information is not intended to replace advice given to you by your health care provider. Make sure you discuss any questions you have with your health care provider.   Document Released: 12/27/2004 Document Revised: 07/14/2012 Document Reviewed: 04/18/2012 Elsevier Interactive Patient Education 2016 Else[employer] Inc[employer]    Peripheral Vascular Disease Peripheral vascular disease (PVD) is a disease of the blood vessels that are not part of your heart and brain. A simple term for PVD is poor circulation. In most cases, PVD narrows the blood vessels that carry blood from your heart to the rest of your body. This can result in a decreased supply of blood to your arms, legs, and internal organs, like your stomach or kidneys. However, it most often affects a person's lower legs and feet. There are two types of PVD.  Organic PVD. This is the more common type. It is caused by damage to the structure of blood vessels.  Functional PVD. This is caused by conditions that make blood vessels contract and tighten (spasm). Without treatment, PVD tends to get worse over time. PVD can also lead to acute ischemic limb. This is when an arm or limb suddenly has trouble getting enough blood. This is a medical emergency. CAUSES Each type of PVD has many different causes. The most common cause of PVD is buildup of a fatty material (plaque) inside of your arteries (atherosclerosis). Small amounts of plaque can break off from the walls of the blood vessels and become lodged in a smaller artery. This blocks blood flow and can cause acute ischemic limb. Other common causes of PVD include:  Blood clots that form inside of blood vessels.  Injuries to blood vessels.  Diseases that cause inflammation of blood vessels or cause blood vessel spasms.  Health behaviors and health  history that increase your risk of developing PVD. RISK FACTORS  You may have a greater risk of PVD if you:  Have a family history of PVD.  Have certain medical conditions, including:  High cholesterol.  Diabetes.  High blood pressure (hypertension).  Coronary heart disease.  Past problems with blood clots.  Past injury, such as burns or a broken bone. These may have damaged blood vessels in your limbs.  Buerger disease. This is caused by inflamed blood vessels in your hands and feet.  Some forms of arthritis.  Rare birth defects that affect the arteries in your legs.  Use tobacco.  Do not get enough exercise.  Are obese.  Are age 50 or older. SIGNS AND SYMPTOMS  PVD may cause many different symptoms. Your symptoms depend on what part of your body is not getting enough blood. Some common signs and symptoms include:  Cramps in your lower legs. This may be a symptom of poor leg circulation (claudication).  Pain and weakness in your legs while you are physically active that goes away when you rest (intermittent claudication).  Leg pain when at rest.  Leg numbness, tingling, or weakness.  Coldness in a leg or foot, especially when compared with the other leg.  Skin or hair changes. These can include:  Hair loss.  Shiny   skin.  Pale or bluish skin.  Thick toenails.  Inability to get or maintain an erection (erectile dysfunction). People with PVD are more prone to developing ulcers and sores on their toes, feet, or legs. These may take longer than normal to heal. DIAGNOSIS Your health care provider may diagnose PVD from your signs and symptoms. The health care provider will also do a physical exam. You may have tests to find out what is causing your PVD and determine its severity. Tests may include:  Blood pressure recordings from your arms and legs and measurements of the strength of your pulses (pulse volume recordings).  Imaging studies using sound waves to  take pictures of the blood flow through your blood vessels (Doppler ultrasound).  Injecting a dye into your blood vessels before having imaging studies using:  X-rays (angiogram or arteriogram).  Computer-generated X-rays (CT angiogram).  A powerful electromagnetic field and a computer (magnetic resonance angiogram or MRA). TREATMENT Treatment for PVD depends on the cause of your condition and the severity of your symptoms. It also depends on your age. Underlying causes need to be treated and controlled. These include long-lasting (chronic) conditions, such as diabetes, high cholesterol, and high blood pressure. You may need to first try making lifestyle changes and taking medicines. Surgery may be needed if these do not work. Lifestyle changes may include:  Quitting smoking.  Exercising regularly.  Following a low-fat, low-cholesterol diet. Medicines may include:  Blood thinners to prevent blood clots.  Medicines to improve blood flow.  Medicines to improve your blood cholesterol levels. Surgical procedures may include:  A procedure that uses an inflated balloon to open a blocked artery and improve blood flow (angioplasty).  A procedure to put in a tube (stent) to keep a blocked artery open (stent implant).  Surgery to reroute blood flow around a blocked artery (peripheral bypass surgery).  Surgery to remove dead tissue from an infected wound on the affected limb.  Amputation. This is surgical removal of the affected limb. This may be necessary in cases of acute ischemic limb that are not improved through medical or surgical treatments. HOME CARE INSTRUCTIONS  Take medicines only as directed by your health care provider.  Do not use any tobacco products, including cigarettes, chewing tobacco, or electronic cigarettes. If you need help quitting, ask your health care provider.  Lose weight if you are overweight, and maintain a healthy weight as directed by your health care  provider.  Eat a diet that is low in fat and cholesterol. If you need help, ask your health care provider.  Exercise regularly. Ask your health care provider to suggest some good activities for you.  Use compression stockings or other mechanical devices as directed by your health care provider.  Take good care of your feet.  Wear comfortable shoes that fit well.  Check your feet often for any cuts or sores. SEEK MEDICAL CARE IF:  You have cramps in your legs while walking.  You have leg pain when you are at rest.  You have coldness in a leg or foot.  Your skin changes.  You have erectile dysfunction.  You have cuts or sores on your feet that are not healing. SEEK IMMEDIATE MEDICAL CARE IF:  Your arm or leg turns cold and blue.  Your arms or legs become red, warm, swollen, painful, or numb.  You have chest pain or trouble breathing.  You suddenly have weakness in your face, arm, or leg.  You become very confused or   lose the ability to speak.  You suddenly have a very bad headache or lose your vision.   This information is not intended to replace advice given to you by your health care provider. Make sure you discuss any questions you have with your health care provider.   Document Released: 04/26/2004 Document Revised: 04/09/2014 Document Reviewed: 08/27/2013 Elsevier Interactive Patient Education [employer].

## 2015-08-04 DIAGNOSIS — R1312 Dysphagia, oropharyngeal phase: Secondary | ICD-10-CM | POA: Diagnosis not present

## 2015-08-04 DIAGNOSIS — R1313 Dysphagia, pharyngeal phase: Secondary | ICD-10-CM | POA: Diagnosis not present

## 2015-08-04 DIAGNOSIS — J3801 Paralysis of vocal cords and larynx, unilateral: Secondary | ICD-10-CM | POA: Diagnosis not present

## 2015-08-04 DIAGNOSIS — K229 Disease of esophagus, unspecified: Secondary | ICD-10-CM | POA: Diagnosis not present

## 2015-08-22 DIAGNOSIS — Z7901 Long term (current) use of anticoagulants: Secondary | ICD-10-CM | POA: Diagnosis not present

## 2015-08-31 DIAGNOSIS — Z7901 Long term (current) use of anticoagulants: Secondary | ICD-10-CM | POA: Diagnosis not present

## 2015-09-05 DIAGNOSIS — H401231 Low-tension glaucoma, bilateral, mild stage: Secondary | ICD-10-CM | POA: Diagnosis not present

## 2015-09-06 DIAGNOSIS — H401231 Low-tension glaucoma, bilateral, mild stage: Secondary | ICD-10-CM | POA: Diagnosis not present

## 2015-09-06 DIAGNOSIS — H2513 Age-related nuclear cataract, bilateral: Secondary | ICD-10-CM | POA: Diagnosis not present

## 2015-09-06 NOTE — Addendum Note (Signed)
Addended by: Thresa Ross C on: 09/06/2015 01:15 PM   Modules accepted: Orders

## 2015-10-12 DIAGNOSIS — Z7901 Long term (current) use of anticoagulants: Secondary | ICD-10-CM | POA: Diagnosis not present

## 2015-10-18 DIAGNOSIS — I10 Essential (primary) hypertension: Secondary | ICD-10-CM | POA: Diagnosis not present

## 2015-10-18 DIAGNOSIS — E782 Mixed hyperlipidemia: Secondary | ICD-10-CM | POA: Diagnosis not present

## 2015-10-18 DIAGNOSIS — E6609 Other obesity due to excess calories: Secondary | ICD-10-CM | POA: Diagnosis not present

## 2015-10-18 DIAGNOSIS — E1165 Type 2 diabetes mellitus with hyperglycemia: Secondary | ICD-10-CM | POA: Diagnosis not present

## 2015-10-18 DIAGNOSIS — Z7901 Long term (current) use of anticoagulants: Secondary | ICD-10-CM | POA: Diagnosis not present

## 2015-10-18 DIAGNOSIS — Z1389 Encounter for screening for other disorder: Secondary | ICD-10-CM | POA: Diagnosis not present

## 2015-10-18 DIAGNOSIS — Z6834 Body mass index (BMI) 34.0-34.9, adult: Secondary | ICD-10-CM | POA: Diagnosis not present

## 2015-11-15 DIAGNOSIS — R633 Feeding difficulties: Secondary | ICD-10-CM | POA: Diagnosis not present

## 2015-11-15 DIAGNOSIS — R1310 Dysphagia, unspecified: Secondary | ICD-10-CM | POA: Diagnosis not present

## 2015-11-15 DIAGNOSIS — J3801 Paralysis of vocal cords and larynx, unilateral: Secondary | ICD-10-CM | POA: Diagnosis not present

## 2015-11-15 DIAGNOSIS — R1313 Dysphagia, pharyngeal phase: Secondary | ICD-10-CM | POA: Diagnosis not present

## 2015-11-15 DIAGNOSIS — K229 Disease of esophagus, unspecified: Secondary | ICD-10-CM | POA: Diagnosis not present

## 2015-11-24 DIAGNOSIS — C61 Malignant neoplasm of prostate: Secondary | ICD-10-CM | POA: Diagnosis not present

## 2015-12-06 DIAGNOSIS — Z7901 Long term (current) use of anticoagulants: Secondary | ICD-10-CM | POA: Diagnosis not present

## 2015-12-19 ENCOUNTER — Ambulatory Visit: Payer: Commercial Managed Care - HMO | Admitting: Cardiovascular Disease

## 2015-12-22 DIAGNOSIS — Z23 Encounter for immunization: Secondary | ICD-10-CM | POA: Diagnosis not present

## 2016-01-09 DIAGNOSIS — K219 Gastro-esophageal reflux disease without esophagitis: Secondary | ICD-10-CM | POA: Diagnosis not present

## 2016-01-09 DIAGNOSIS — J3801 Paralysis of vocal cords and larynx, unilateral: Secondary | ICD-10-CM | POA: Diagnosis not present

## 2016-01-09 DIAGNOSIS — I10 Essential (primary) hypertension: Secondary | ICD-10-CM | POA: Diagnosis not present

## 2016-01-09 DIAGNOSIS — I2510 Atherosclerotic heart disease of native coronary artery without angina pectoris: Secondary | ICD-10-CM | POA: Diagnosis not present

## 2016-01-09 DIAGNOSIS — I4891 Unspecified atrial fibrillation: Secondary | ICD-10-CM | POA: Diagnosis not present

## 2016-01-09 DIAGNOSIS — I739 Peripheral vascular disease, unspecified: Secondary | ICD-10-CM | POA: Diagnosis not present

## 2016-01-09 DIAGNOSIS — K222 Esophageal obstruction: Secondary | ICD-10-CM | POA: Diagnosis not present

## 2016-01-09 DIAGNOSIS — E119 Type 2 diabetes mellitus without complications: Secondary | ICD-10-CM | POA: Diagnosis not present

## 2016-01-09 DIAGNOSIS — R1312 Dysphagia, oropharyngeal phase: Secondary | ICD-10-CM | POA: Diagnosis not present

## 2016-02-09 DIAGNOSIS — K229 Disease of esophagus, unspecified: Secondary | ICD-10-CM | POA: Diagnosis not present

## 2016-02-09 DIAGNOSIS — R490 Dysphonia: Secondary | ICD-10-CM | POA: Diagnosis not present

## 2016-02-09 DIAGNOSIS — J3801 Paralysis of vocal cords and larynx, unilateral: Secondary | ICD-10-CM | POA: Diagnosis not present

## 2016-02-09 DIAGNOSIS — R1313 Dysphagia, pharyngeal phase: Secondary | ICD-10-CM | POA: Diagnosis not present

## 2016-02-09 DIAGNOSIS — R1312 Dysphagia, oropharyngeal phase: Secondary | ICD-10-CM | POA: Diagnosis not present

## 2016-02-15 DIAGNOSIS — Z7901 Long term (current) use of anticoagulants: Secondary | ICD-10-CM | POA: Diagnosis not present

## 2016-03-08 DIAGNOSIS — E119 Type 2 diabetes mellitus without complications: Secondary | ICD-10-CM | POA: Diagnosis not present

## 2016-03-08 DIAGNOSIS — E6609 Other obesity due to excess calories: Secondary | ICD-10-CM | POA: Diagnosis not present

## 2016-03-08 DIAGNOSIS — I10 Essential (primary) hypertension: Secondary | ICD-10-CM | POA: Diagnosis not present

## 2016-03-08 DIAGNOSIS — Z1389 Encounter for screening for other disorder: Secondary | ICD-10-CM | POA: Diagnosis not present

## 2016-03-08 DIAGNOSIS — E782 Mixed hyperlipidemia: Secondary | ICD-10-CM | POA: Diagnosis not present

## 2016-03-08 DIAGNOSIS — Z6833 Body mass index (BMI) 33.0-33.9, adult: Secondary | ICD-10-CM | POA: Diagnosis not present

## 2016-03-16 ENCOUNTER — Ambulatory Visit: Payer: Commercial Managed Care - HMO | Admitting: Adult Health

## 2016-03-19 ENCOUNTER — Ambulatory Visit (INDEPENDENT_AMBULATORY_CARE_PROVIDER_SITE_OTHER): Payer: Commercial Managed Care - HMO | Admitting: Adult Health

## 2016-03-19 ENCOUNTER — Encounter: Payer: Self-pay | Admitting: Adult Health

## 2016-03-19 VITALS — BP 140/60 | HR 68 | Ht 70.5 in | Wt 222.6 lb

## 2016-03-19 DIAGNOSIS — I10 Essential (primary) hypertension: Secondary | ICD-10-CM | POA: Diagnosis not present

## 2016-03-19 DIAGNOSIS — I482 Chronic atrial fibrillation, unspecified: Secondary | ICD-10-CM

## 2016-03-19 DIAGNOSIS — M7989 Other specified soft tissue disorders: Secondary | ICD-10-CM

## 2016-03-19 DIAGNOSIS — R001 Bradycardia, unspecified: Secondary | ICD-10-CM

## 2016-03-19 DIAGNOSIS — I2510 Atherosclerotic heart disease of native coronary artery without angina pectoris: Secondary | ICD-10-CM | POA: Diagnosis not present

## 2016-03-19 NOTE — Progress Notes (Signed)
Cardiology Office Note   Date:  03/19/2016   ID:  Troy [name], DOB 09-10-1940, MRN [id number]  PCP:  Purvis Kilts, MD  Cardiologist: Woodroe Chen, NP   Chief Complaint  Patient presents with  . Atrial Fibrillation  . Coronary Artery Disease      History of Present Illness: Troy [name] is a 75 y.o. male who presents for ongoing assessment and management of coronary artery disease, status post coronary artery bypass grafting, most recent cardiac catheterization June 2014 demonstrating one of 4 patent bypass grafts, atrial fibrillation, hypertension, dyslipidemia, carotid artery disease, peripheral vascular disease. He is being followed by [facility] Dr. Donnetta Hutching.   When being seen last the patient was without symptoms, but an echocardiogram was ordered to evaluate cardiac structure function and regional wall motion. He was continued on rate control therapy and anticoagulation with Coumadin.  Left ventricle: The cavity size was normal. Wall thickness was   increased in a pattern of mild LVH. Systolic function was normal.   The estimated ejection fraction was in the range of 55% to 60%.   Wall motion was normal; there were no regional wall motion   abnormalities. - Aortic valve: Moderately calcified annulus. Trileaflet; mildly   thickened leaflets. Valve area (VTI): 1.97 cm^2. Valve area   (Vmax): 2.11 cm^2. - Mitral valve: Mildly calcified annulus. Mildly thickened leaflets   . There was mild regurgitation. - Left atrium: The atrium was severely dilated. - Right ventricle: The cavity size was mildly to moderately   dilated. - Right atrium: The atrium was mildly dilated. - Pulmonary arteries: Systolic pressure was moderately increased.   PA peak pressure: 59 mm Hg (S).  He is here today without any complaints other than chronic lower extremity edema. He is being followed by Dr. Hilma Favors for Coumadin dosing and INR management. He is also had recent labs  which we have not received yet. He denies any bleeding issues excessive bruising palpitations chest pain or dyspnea.  Past Medical History:  Diagnosis Date  . AAA (abdominal aortic aneurysm) ([place])    Followed by Dr. Curt Jews  . Arthritis   . CAD (coronary artery disease)   . Cancer [facility])    Prostate:  Radiation Tx  . Carotid artery disease ([place])    Doppler 08/10/2010,  no change, RICA [id number], LICA [id number]  . Chest pain    precordial. mild chronic .[name]... nonischemic  . Coronary artery disease    a. Nuclear, January, 2008, no ischemia b. Cath 08/2012- 1/4 patent grafts, RCA CTO, no flow-limiting disease, medically managed  . Diabetes mellitus without complication ([place])   . Dizziness 02/2011  . Dyslipidemia   . Edema   . Ejection fraction    Normal LV, nuclear, 2008, ( no echo data as July 31, 2010)  . GERD (gastroesophageal reflux disease)    TAKES TUMS & ROLAIDS AS NEEDED  . Hx of CABG 2000  . Hypertension   . Myocardial infarction 1999  . Neck pain 02/2011  . Pneumonia   . Renal artery stenosis ([facility])    50-70%  . S/P femoropopliteal bypass surgery    Dr. Donnetta Hutching  . Sinus bradycardia    Asymptomatic    Past Surgical History:  Procedure Laterality Date  . CARDIAC CATHETERIZATION  09/26/2012   1/4 patent bypass (occluded SVG-PDA, SVG-OM, LIMA-LAD), SVG-diagonal patent and fills the diagonal and LAD, distal RCA occlusion with left to right collateralization, patent circumflex, LAD with no flow-limiting disease  and antegrade flow competitively from SVG-diagonal; EF 60-65%  . COLONOSCOPY  11/30/2009   [id number]. next TCS 11/2019  . CORONARY ARTERY BYPASS GRAFT  2000  . ESOPHAGOGASTRODUODENOSCOPY N/A 08/12/2013   [id number] peptic stricture with erosive refluxesophagitis - status post Maloney dilation. Hiatal hernia. Abnormalgastric mucosa. Deformity of the pyloric channel suggestive ofprior peptic ulcer disease. Duodenal bulbar diverticulum Statuspost gastric  biopsy. h.pylori  . LEFT HEART CATHETERIZATION WITH CORONARY/GRAFT ANGIOGRAM N/A 09/26/2012   Procedure: LEFT HEART CATHETERIZATION WITH Beatrix Fetters;  Surgeon: Burnell Blanks, MD;  Location: [facility] CATH LAB;  Service: Cardiovascular;  Laterality: N/A;  . Venia Minks DILATION N/A 08/12/2013   Procedure: Venia Minks DILATION;  Surgeon: Daneil Dolin, MD;  Location: [facility] ENDO SUITE;  Service: Endoscopy;  Laterality: N/A;  . PR VEIN BYPASS GRAFT,AORTO-FEM-POP Right 07/19/1999  . PR VEIN BYPASS GRAFT,AORTO-FEM-POP Left 05/02/2006  . SAVORY DILATION N/A 08/12/2013   Procedure: SAVORY DILATION;  Surgeon: Daneil Dolin, MD;  Location: [facility] ENDO SUITE;  Service: Endoscopy;  Laterality: N/A;  . WRIST SURGERY     cyst removal     Current Outpatient Prescriptions  Medication Sig Dispense Refill  . amLODipine (NORVASC) 10 MG tablet Take 10 mg by mouth daily.      [name] aspirin EC 81 MG EC tablet Take 1 tablet (81 mg total) by mouth daily. 30 tablet 3  . atorvastatin (LIPITOR) 40 MG tablet Take 1 tablet (40 mg total) by mouth daily. 90 tablet 1  . fexofenadine (ALLEGRA) 180 MG tablet Take 180 mg by mouth daily.    . furosemide (LASIX) 40 MG tablet Take 40 mg by mouth daily.      [name] HYDROcodone-acetaminophen (VICODIN) 5-500 MG per tablet Take 1 tablet by mouth every 6 (six) hours as needed for pain.     . Isopropyl Alcohol (ALCOHOL PREP PADS EX)     . isosorbide mononitrate (IMDUR) 30 MG 24 hr tablet Take 1 tablet (30 mg total) by mouth daily. 90 tablet 1  . losartan (COZAAR) 100 MG tablet Take 100 mg by mouth daily.      . metFORMIN (GLUCOPHAGE) 1000 MG tablet Take 1 tablet (1,000 mg total) by mouth 2 (two) times daily with a meal.    . metoprolol tartrate (LOPRESSOR) 25 MG tablet Take 0.5 tablets (12.5 mg total) by mouth 2 (two) times daily. 90 tablet 1  . nitroGLYCERIN (NITROSTAT) 0.4 MG SL tablet Place 1 tablet (0.4 mg total) under the tongue every 5 (five) minutes x 3 doses as needed for chest pain.  25 tablet 3  . pantoprazole (PROTONIX) 40 MG tablet TAKE 1 TABLET EVERY DAY (Patient taking differently: TAKE 1 TABLET  EVERY DAY) 90 tablet 3  . tamsulosin (FLOMAX) 0.4 MG CAPS capsule     . tetrahydrozoline-zinc (VISINE-AC) 0.05-0.25 % ophthalmic solution 2 drops 3 (three) times daily as needed.    . VENTOLIN HFA 108 (90 Base) MCG/ACT inhaler     . warfarin (COUMADIN) 2 MG tablet Take 2 mg by mouth daily.     No current facility-administered medications for this visit.     Allergies:   Niacin; Iodine-131; and Nsaids    Social History:  The patient  reports that he quit smoking about 17 years ago. He has never used smokeless tobacco. He reports that he drinks alcohol. He reports that he does not use drugs.   Family History:  The patient's family history includes Breast cancer in his sister; Cancer in his brother; Deep vein thrombosis in  his father; Diabetes in his sister and sister; Heart attack in his brother and daughter; Heart disease in his brother and sister; Hyperlipidemia in his sister; Hypertension in his mother and sister; Lung cancer in his sister and sister.    ROS: All other systems are reviewed and negative. Unless otherwise mentioned in H&P    PHYSICAL EXAM: VS:  BP 140/60   Pulse 68   Ht 5' 10.5" (1.791 m)   Wt 222 lb 9.6 oz (101 kg)   SpO2 93%   BMI 31.49 kg/m  , BMI Body mass index is 31.49 kg/m. GEN: Well nourished, well developed, in no acute distress  HEENT: normal  Neck: no JVD, carotid bruits, or masses Cardiac: IRRR; no murmurs, rubs, or gallops, 2+ pitting pretibial edema on the left, 1+ pretibial pitting edema on the right  Respiratory:  Clear to auscultation bilaterally, normal work of breathing GI: soft, nontender, nondistended, + BS MS: no deformity or atrophy  Skin: warm and dry, no rash Neuro:  Strength and sensation are intact Psych: euthymic mood, full affect   EKG:   The ekg ordered today demonstrates atrial fibrillation, right  bundle-branch block, rate of 52 bpm.   Recent Labs: 03/25/2015: Creatinine, Ser 0.80    Lipid Panel    Component Value Date/Time   CHOL 129 09/25/2012 0618   TRIG 152 (H) 09/25/2012 0618   HDL 31 (L) 09/25/2012 0618   CHOLHDL 4.2 09/25/2012 0618   VLDL 30 09/25/2012 0618   LDLCALC 68 09/25/2012 0618      Wt Readings from Last 3 Encounters:  03/19/16 222 lb 9.6 oz (101 kg)  08/02/15 218 lb (98.9 kg)  06/08/15 230 lb (104.3 kg)     ASSESSMENT AND PLAN:  1. Atrial fibrillation: Heart rate is currently well controlled. He will continue to take metoprolol 12.5 mg twice a day, remains on Coumadin with PT INR checked by primary care. CHADS VASC Score 4.   2. Chronic lower extremity edema: He does have peripheral artery disease and has had coronary artery bypass grafting. He is also on at least 2 medications which can cause lower extremity edema to include Imdur and amlodipine. His legs do decrease in edema by morning. He does not like wearing support hose as he feels that they are too uncomfortable. There is no evidence of weeping or skin splitting.  3. Hypertension: Blood pressure is currently well controlled on multiple medications to include amlodipine, metoprolol, isosorbide, and losartan. No changes will be made in medication regimen.  4. CAD: Continue risk management, beta blocker, aspirin, and statin therapy.  Current medicines are reviewed at length with the patient today.    Labs/ tests ordered today include: Recently completed by primary care.   Orders Placed This Encounter  Procedures  . EKG 12-Lead     Disposition:   FU with  6 months unless symptomatic. Signed, Jory Sims, NP  03/19/2016 4:07 PM    [facility] [address] [place], [place] [address] Phone: [phone]; Fax: [phone]

## 2016-03-19 NOTE — Patient Instructions (Signed)
Your physician wants you to follow-up in: 6 Months with Dr. Koneswaran. You will receive a reminder letter in the mail two months in advance. If you don't receive a letter, please call our office to schedule the follow-up appointment.  Your physician recommends that you continue on your current medications as directed. Please refer to the Current Medication list given to you today.  If you need a refill on your cardiac medications before your next appointment, please call your pharmacy.  Thank you for choosing [facility]!   

## 2016-03-19 NOTE — Progress Notes (Signed)
Name: Troy [name]    DOB: Aug 12, 1940  Age: 75 y.o.  MR#: [id number]       PCP:  Purvis Kilts, MD      Insurance: Payor: [email or web address] MEDICARE / Plan: [place] THN/NTSP / Product Type: *No Product type* /   CC:   No chief complaint on file.   VS Vitals:   03/19/16 1516  BP: 140/60  Pulse: 68  SpO2: 93%  Weight: 222 lb 9.6 oz (101 kg)  Height: 5' 10.5" (1.791 m)    Weights Current Weight  03/19/16 222 lb 9.6 oz (101 kg)  08/02/15 218 lb (98.9 kg)  06/08/15 230 lb (104.3 kg)    Blood Pressure  BP Readings from Last 3 Encounters:  03/19/16 140/60  08/02/15 136/74  06/08/15 134/68     Admit date:  (Not on file) Last encounter with RMR:  Visit date not found   Allergy Niacin; Iodine-131; and Nsaids  Current Outpatient Prescriptions  Medication Sig Dispense Refill  . amLODipine (NORVASC) 10 MG tablet Take 10 mg by mouth daily.      [name] aspirin EC 81 MG EC tablet Take 1 tablet (81 mg total) by mouth daily. 30 tablet 3  . atorvastatin (LIPITOR) 40 MG tablet Take 1 tablet (40 mg total) by mouth daily. 90 tablet 1  . fexofenadine (ALLEGRA) 180 MG tablet Take 180 mg by mouth daily.    . furosemide (LASIX) 40 MG tablet Take 40 mg by mouth daily.      [name] HYDROcodone-acetaminophen (VICODIN) 5-500 MG per tablet Take 1 tablet by mouth every 6 (six) hours as needed for pain.     . Isopropyl Alcohol (ALCOHOL PREP PADS EX)     . isosorbide mononitrate (IMDUR) 30 MG 24 hr tablet Take 1 tablet (30 mg total) by mouth daily. 90 tablet 1  . losartan (COZAAR) 100 MG tablet Take 100 mg by mouth daily.      . metFORMIN (GLUCOPHAGE) 1000 MG tablet Take 1 tablet (1,000 mg total) by mouth 2 (two) times daily with a meal.    . metoprolol tartrate (LOPRESSOR) 25 MG tablet Take 0.5 tablets (12.5 mg total) by mouth 2 (two) times daily. 90 tablet 1  . nitroGLYCERIN (NITROSTAT) 0.4 MG SL tablet Place 1 tablet (0.4 mg total) under the tongue every 5 (five) minutes x 3 doses as needed for chest  pain. 25 tablet 3  . pantoprazole (PROTONIX) 40 MG tablet TAKE 1 TABLET EVERY DAY (Patient taking differently: TAKE 1 TABLET  EVERY DAY) 90 tablet 3  . tamsulosin (FLOMAX) 0.4 MG CAPS capsule     . tetrahydrozoline-zinc (VISINE-AC) 0.05-0.25 % ophthalmic solution 2 drops 3 (three) times daily as needed.    . VENTOLIN HFA 108 (90 Base) MCG/ACT inhaler     . warfarin (COUMADIN) 2 MG tablet Take 2 mg by mouth daily.     No current facility-administered medications for this visit.     Discontinued Meds:   There are no discontinued medications.  Patient Active Problem List   Diagnosis Date Noted  . Atrial fibrillation ([place]) 12/24/2014  . Bowel habit changes 09/16/2014  . Erosive esophagitis 02/11/2014  . Abdominal aneurysm without mention of rupture 07/28/2013  . Peripheral vascular disease, unspecified 07/28/2013  . Esophageal dysphagia 07/21/2013  . CAD (coronary artery disease)   . Type 2 diabetes mellitus ([place]) 09/27/2012  . Hypertension 09/27/2012  . Atherosclerosis of native arteries of the extremities with intermittent claudication 02/12/2012  .  Limb swelling 02/12/2012  . 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 Neck pain   . Dizziness   . Carotid artery disease ([place])   . Renal artery stenosis ([place])   . S/P femoropopliteal bypass surgery   . Ejection fraction   . Sinus bradycardia   . Hyperlipidemia 01/04/2010  . EDEMA 07/04/2008  . CORONARY ARTERY BYPASS GRAFT, HX OF 07/04/2008    LABS    Component Value Date/Time   NA 143 09/25/2012 0618   NA 141 09/24/2012 1120   K 3.7 09/25/2012 0618   K 3.9 09/24/2012 1120   CL 104 09/25/2012 0618   CL 103 09/24/2012 1120   CO2 29 09/25/2012 0618   CO2 27 09/24/2012 1120   GLUCOSE 115 (H) 09/25/2012 0618   GLUCOSE 119 (H) 09/24/2012 1120   BUN 10 09/25/2012 0618   BUN 8 09/24/2012 1120   CREATININE 0.80 03/25/2015 1103   CREATININE 0.74 09/25/2012 0618   CREATININE 0.74 09/24/2012 1120    CALCIUM 8.9 09/25/2012 0618   CALCIUM 8.8 09/24/2012 1120   GFRNONAA >90 09/25/2012 0618   GFRNONAA >90 09/24/2012 1120   GFRAA >90 09/25/2012 0618   GFRAA >90 09/24/2012 1120   CMP     Component Value Date/Time   NA 143 09/25/2012 0618   K 3.7 09/25/2012 0618   CL 104 09/25/2012 0618   CO2 29 09/25/2012 0618   GLUCOSE 115 (H) 09/25/2012 0618   BUN 10 09/25/2012 0618   CREATININE 0.80 03/25/2015 1103   CALCIUM 8.9 09/25/2012 0618   PROT 6.7 09/09/2007 0838   ALBUMIN 3.7 09/09/2007 0838   AST 31 09/09/2007 0838   ALT 20 09/09/2007 0838   ALKPHOS 55 09/09/2007 0838   BILITOT 0.9 09/09/2007 0838   GFRNONAA >90 09/25/2012 0618   GFRAA >90 09/25/2012 0618       Component Value Date/Time   WBC 7.1 09/27/2012 0540   WBC 7.9 09/26/2012 0450   WBC 7.4 09/25/2012 0618   HGB 13.8 09/27/2012 0540   HGB 14.2 09/26/2012 0450   HGB 14.9 09/25/2012 0618   HCT 41.9 09/27/2012 0540   HCT 43.2 09/26/2012 0450   HCT 44.9 09/25/2012 0618   MCV 91.5 09/27/2012 0540   MCV 91.1 09/26/2012 0450   MCV 90.7 09/25/2012 0618    Lipid Panel     Component Value Date/Time   CHOL 129 09/25/2012 0618   TRIG 152 (H) 09/25/2012 0618   HDL 31 (L) 09/25/2012 0618   CHOLHDL 4.2 09/25/2012 0618   VLDL 30 09/25/2012 0618   LDLCALC 68 09/25/2012 0618    ABG No results found for: PHART, PCO2ART, PO2ART, HCO3, TCO2, ACIDBASEDEF, O2SAT   Lab Results  Component Value Date   TSH 0.531 09/24/2012   BNP (last 3 results) No results for input(s): BNP in the last 8760 hours.  ProBNP (last 3 results) No results for input(s): PROBNP in the last 8760 hours.  Cardiac Panel (last 3 results) No results for input(s): CKTOTAL, CKMB, TROPONINI, RELINDX in the last 72 hours.  Iron/TIBC/Ferritin/ %Sat No results found for: IRON, TIBC, FERRITIN, IRONPCTSAT   EKG Orders placed or performed in visit on 12/24/14  . EKG 12-Lead     Prior Assessment and Plan Problem List as of 03/19/2016 Reviewed: 08/02/2015  10:07 AM by Clemon Chambers L, NP     Cardiovascular and Mediastinum   CORONARY ARTERY BYPASS GRAFT, HX OF   Renal artery stenosis (HCC)   Last  Assessment & Plan 02/14/2011 Office Visit Written 02/14/2011 11:38 AM by Carlena Bjornstad, MD    We know that he has some renal artery stenosis.  His blood pressure is adequately controlled.      Sinus bradycardia   Last Assessment & Plan 07/31/2010 Office Visit Written 07/31/2010 10:43 AM by Carlena Bjornstad, MD    He has a resting sinus bradycardia.  There is no symptoms.  No change in medicine.      Carotid artery disease [facility])   Last Assessment & Plan 12/24/2014 Office Visit Written 12/24/2014  5:08 PM by Carlena Bjornstad, MD    The patient has significant carotid disease. He is followed carefully with Dopplers.      Chronic total occlusion of artery of the extremities [facility])   Atherosclerosis of native arteries of the extremities with intermittent claudication   Hypertension   Last Assessment & Plan 05/31/2014 Office Visit Written 05/31/2014  2:38 PM by Carlena Bjornstad, MD    Blood pressure control. No change in therapy.      CAD (coronary artery disease)   Last Assessment & Plan 12/24/2014 Office Visit Written 12/24/2014  5:07 PM by Carlena Bjornstad, MD    The patient has no coronary disease. His last cath was in 2014. The details are outlined under the problem list. Medical therapy was recommended. He did not require intervention.      Abdominal aneurysm without mention of rupture   Last Assessment & Plan 11/30/2013 Office Visit Written 11/30/2013 12:14 PM by Carlena Bjornstad, MD    His overall vascular status remains stable and followup with vascular surgery.      Peripheral vascular disease, unspecified   Atrial fibrillation [facility])   Last Assessment & Plan 12/24/2014 Office Visit Written 12/24/2014  5:06 PM by Carlena Bjornstad, MD    Atrial fibrillation is a new diagnosis today. He has no symptoms. The rate is controlled. I cannot document how long he  has been in atrial fibrillation. He is already coumadinized for other reasons. Therefore I have decided to not proceed with any further evaluation. I decided not to proceed with cardioversion at this time. It is possible that he may go in and out of atrial fibrillation. In addition, he has no symptoms.        Digestive   Esophageal dysphagia   Last Assessment & Plan 07/21/2013 Office Visit Written 07/21/2013 11:47 AM by Mahala Menghini, PA-C    74 year old gentleman with progressive esophageal dysphagia to solid foods and pills. No typical reflux symptoms. No prior upper endoscopy. Recommend EGD with dilation in the near future. He is on chronic Coumadin therapy with history of peripheral vascular disease. We have sent request to Dr. Hilma Favors who is the managing physician to determine if we can hold his Coumadin for procedure or if Lovenox bridging as necessary. Once this information has been received we will schedule him for an EGD with dilation.  I have discussed the risks, alternatives, benefits with regards to but not limited to the risk of reaction to medication, bleeding, infection, perforation and the patient is agreeable to proceed. Written consent to be obtained.       Erosive esophagitis   Last Assessment & Plan 09/16/2014 Office Visit Written 09/16/2014 11:51 AM by Mahala Menghini, PA-C    History of erosive esophagitis and benign-appearing peptic stricture. Really no recurrent dysphagia. No significant heartburn. Patient questions whether he can stop pantoprazole. Advised that if he tries cut  off the medication, he should resume it if he develops recurrent heartburn on a regular basis (more than 3-4 times per week). Advised that I suspect these will require chronic therapy. Discussed risk of recurrent esophagitis and stricture if has ongoing uncontrolled GERD. Really no problems swallowing at this time. If he notices any change she'll let us know.        Endocrine   Type 2 diabetes mellitus  ([place])     Other   Hyperlipidemia   Last Assessment & Plan 05/31/2014 Office Visit Written 05/31/2014  2:39 PM by Carlena Bjornstad, MD    Lipitor being treated properly. No change in therapy.      EDEMA   Last Assessment & Plan 07/31/2010 Office Visit Written 07/31/2010 10:44 AM by Carlena Bjornstad, MD    He does not have any edema at this time.  No change in therapy.      S/P femoropopliteal bypass surgery   Ejection fraction   Neck pain   Last Assessment & Plan 02/14/2011 Office Visit Written 02/14/2011 11:36 AM by Carlena Bjornstad, MD    I think that his neck pain is most likely musculoskeletal.  No further cardiac workup for this.      Dizziness   Last Assessment & Plan 05/04/2011 Office Visit Written 05/04/2011  9:36 AM by Carlena Bjornstad, MD    Dizziness is improved with the patient off Lopressor. No further workup.      Limb swelling   Bowel habit changes   Last Assessment & Plan 09/16/2014 Office Visit Written 09/16/2014 11:52 AM by Mahala Menghini, PA-C    Minor changes in bowel habits. Last colonoscopy 2011, diverticulosis. Stools are soft. May not go every day but it may go several times in a row. Add probiotic. Fiber choice 2 daily. Call if bowel function does not improve. If bowel function does not normalize, could consider colonoscopy. Otherwise we'll see him back in January 2017.          Imaging: No results found.

## 2016-05-01 DIAGNOSIS — Z7901 Long term (current) use of anticoagulants: Secondary | ICD-10-CM | POA: Diagnosis not present

## 2016-05-07 DIAGNOSIS — H5210 Myopia, unspecified eye: Secondary | ICD-10-CM | POA: Diagnosis not present

## 2016-05-07 DIAGNOSIS — E1165 Type 2 diabetes mellitus with hyperglycemia: Secondary | ICD-10-CM | POA: Diagnosis not present

## 2016-05-17 DIAGNOSIS — R1310 Dysphagia, unspecified: Secondary | ICD-10-CM | POA: Diagnosis not present

## 2016-05-17 DIAGNOSIS — R490 Dysphonia: Secondary | ICD-10-CM | POA: Diagnosis not present

## 2016-05-17 DIAGNOSIS — R1313 Dysphagia, pharyngeal phase: Secondary | ICD-10-CM | POA: Diagnosis not present

## 2016-05-17 DIAGNOSIS — J3801 Paralysis of vocal cords and larynx, unilateral: Secondary | ICD-10-CM | POA: Diagnosis not present

## 2016-05-17 DIAGNOSIS — R633 Feeding difficulties: Secondary | ICD-10-CM | POA: Diagnosis not present

## 2016-05-28 DIAGNOSIS — Z1389 Encounter for screening for other disorder: Secondary | ICD-10-CM | POA: Diagnosis not present

## 2016-05-28 DIAGNOSIS — Z6834 Body mass index (BMI) 34.0-34.9, adult: Secondary | ICD-10-CM | POA: Diagnosis not present

## 2016-05-28 DIAGNOSIS — I10 Essential (primary) hypertension: Secondary | ICD-10-CM | POA: Diagnosis not present

## 2016-05-28 DIAGNOSIS — Z0000 Encounter for general adult medical examination without abnormal findings: Secondary | ICD-10-CM | POA: Diagnosis not present

## 2016-05-28 DIAGNOSIS — E119 Type 2 diabetes mellitus without complications: Secondary | ICD-10-CM | POA: Diagnosis not present

## 2016-05-30 DIAGNOSIS — Z7901 Long term (current) use of anticoagulants: Secondary | ICD-10-CM | POA: Diagnosis not present

## 2016-07-05 DIAGNOSIS — Z7901 Long term (current) use of anticoagulants: Secondary | ICD-10-CM | POA: Diagnosis not present

## 2016-07-24 ENCOUNTER — Encounter: Payer: Self-pay | Admitting: Cardiovascular Disease

## 2016-07-30 DIAGNOSIS — I10 Essential (primary) hypertension: Secondary | ICD-10-CM | POA: Diagnosis not present

## 2016-07-30 DIAGNOSIS — E785 Hyperlipidemia, unspecified: Secondary | ICD-10-CM | POA: Diagnosis not present

## 2016-07-30 DIAGNOSIS — I2510 Atherosclerotic heart disease of native coronary artery without angina pectoris: Secondary | ICD-10-CM | POA: Diagnosis not present

## 2016-07-30 DIAGNOSIS — Z6834 Body mass index (BMI) 34.0-34.9, adult: Secondary | ICD-10-CM | POA: Diagnosis not present

## 2016-07-30 DIAGNOSIS — N189 Chronic kidney disease, unspecified: Secondary | ICD-10-CM | POA: Diagnosis not present

## 2016-07-30 DIAGNOSIS — Z7901 Long term (current) use of anticoagulants: Secondary | ICD-10-CM | POA: Diagnosis not present

## 2016-07-30 DIAGNOSIS — E1165 Type 2 diabetes mellitus with hyperglycemia: Secondary | ICD-10-CM | POA: Diagnosis not present

## 2016-07-30 DIAGNOSIS — E6609 Other obesity due to excess calories: Secondary | ICD-10-CM | POA: Diagnosis not present

## 2016-07-30 DIAGNOSIS — E782 Mixed hyperlipidemia: Secondary | ICD-10-CM | POA: Diagnosis not present

## 2016-08-08 ENCOUNTER — Encounter: Payer: Self-pay | Admitting: Family

## 2016-08-14 ENCOUNTER — Ambulatory Visit (INDEPENDENT_AMBULATORY_CARE_PROVIDER_SITE_OTHER): Payer: Medicare HMO | Admitting: Family

## 2016-08-14 ENCOUNTER — Ambulatory Visit (INDEPENDENT_AMBULATORY_CARE_PROVIDER_SITE_OTHER)
Admission: RE | Admit: 2016-08-14 | Discharge: 2016-08-14 | Disposition: A | Discharge status: Home / Self Care | Payer: Medicare HMO | Source: Ambulatory Visit | Attending: Vascular Surgery | Admitting: Vascular Surgery

## 2016-08-14 ENCOUNTER — Ambulatory Visit (HOSPITAL_COMMUNITY)
Admission: RE | Admit: 2016-08-14 | Discharge: 2016-08-14 | Disposition: A | Discharge status: Home / Self Care | Payer: Medicare HMO | Source: Ambulatory Visit | Attending: Vascular Surgery | Admitting: Vascular Surgery

## 2016-08-14 ENCOUNTER — Encounter: Payer: Self-pay | Admitting: Family

## 2016-08-14 VITALS — BP 119/66 | HR 56 | Temp 96.9°F | Resp 16 | Ht 70.5 in | Wt 220.0 lb

## 2016-08-14 DIAGNOSIS — I739 Peripheral vascular disease, unspecified: Secondary | ICD-10-CM

## 2016-08-14 DIAGNOSIS — I714 Abdominal aortic aneurysm, without rupture, unspecified: Secondary | ICD-10-CM

## 2016-08-14 DIAGNOSIS — Z95828 Presence of other vascular implants and grafts: Secondary | ICD-10-CM

## 2016-08-14 DIAGNOSIS — E1151 Type 2 diabetes mellitus with diabetic peripheral angiopathy without gangrene: Secondary | ICD-10-CM

## 2016-08-14 DIAGNOSIS — Z87891 Personal history of nicotine dependence: Secondary | ICD-10-CM

## 2016-08-14 DIAGNOSIS — I872 Venous insufficiency (chronic) (peripheral): Secondary | ICD-10-CM | POA: Diagnosis not present

## 2016-08-14 NOTE — Patient Instructions (Addendum)
Abdominal Aortic Aneurysm Blood pumps away from the heart through tubes (blood vessels) called arteries. Aneurysms are weak or damaged places in the wall of an artery. It bulges out like a balloon. An abdominal aortic aneurysm happens in the main artery of the body (aorta). It can burst or tear, causing bleeding inside the body. This is an emergency. It needs treatment right away. What are the causes? The exact cause is unknown. Things that could cause this problem include:  Fat and other substances building up in the lining of a tube.  Swelling of the walls of a blood vessel.  Certain tissue diseases.  Belly (abdominal) trauma.  An infection in the main artery of the body.  What increases the risk? There are things that make it more likely for you to have an aneurysm. These include:  Being over the age of 74 years old.  Having high blood pressure (hypertension).  Being a male.  Being white.  Being very overweight (obese).  Having a family history of aneurysm.  Using tobacco products.  What are the signs or symptoms? Symptoms depend on the size of the aneurysm and how fast it grows. There may not be symptoms. If symptoms occur, they can include:  Pain (belly, side, lower back, or groin).  Feeling full after eating a small amount of food.  Feeling sick to your stomach (nauseous), throwing up (vomiting), or both.  Feeling a lump in your belly that feels like it is beating (pulsating).  Feeling like you will pass out (faint).  How is this treated?  Medicine to control blood pressure and pain.  Imaging tests to see if the aneurysm gets bigger.  Surgery. How is this prevented? To lessen your chance of getting this condition:  Stop smoking. Stop chewing tobacco.  Limit or avoid alcohol.  Keep your blood pressure, blood sugar, and cholesterol within normal limits.  Eat less salt.  Eat foods low in saturated fats and cholesterol. These are found in animal and  whole dairy products.  Eat more fiber. Fiber is found in whole grains, vegetables, and fruits.  Keep a healthy weight.  Stay active and exercise often.  This information is not intended to replace advice given to you by your health care provider. Make sure you discuss any questions you have with your health care provider. Document Released: 07/14/2012 Document Revised: 08/25/2015 Document Reviewed: 04/18/2012 Elsevier Interactive Patient Education  2017 Elsevier Inc.     Peripheral Vascular Disease Peripheral vascular disease (PVD) is a disease of the blood vessels that are not part of your heart and brain. A simple term for PVD is poor circulation. In most cases, PVD narrows the blood vessels that carry blood from your heart to the rest of your body. This can result in a decreased supply of blood to your arms, legs, and internal organs, like your stomach or kidneys. However, it most often affects a person's lower legs and feet. There are two types of PVD.  Organic PVD. This is the more common type. It is caused by damage to the structure of blood vessels.  Functional PVD. This is caused by conditions that make blood vessels contract and tighten (spasm).  Without treatment, PVD tends to get worse over time. PVD can also lead to acute ischemic limb. This is when an arm or limb suddenly has trouble getting enough blood. This is a medical emergency. Follow these instructions at home:  Take medicines only as told by your doctor.  Do not use   cigarettes, chewing tobacco, or electronic cigarettes. If you need help quitting, ask your doctor.  Lose weight if you are overweight, and maintain a healthy weight as told by your doctor.  Eat a diet that is low in fat and cholesterol. If you need help, ask your doctor.  Exercise regularly. Ask your doctor for some good activities for you.  Take good care of your feet.  Wear comfortable shoes that fit well.  Check  your feet often for any cuts or sores. Contact a doctor if:  You have cramps in your legs while walking.  You have leg pain when you are at rest.  You have coldness in a leg or foot.  Your skin changes.  You are unable to get or have an erection (erectile dysfunction).  You have cuts or sores on your feet that are not healing. Get help right away if:  Your arm or leg turns cold and blue.  Your arms or legs become red, warm, swollen, painful, or numb.  You have chest pain or trouble breathing.  You suddenly have weakness in your face, arm, or leg.  You become very confused or you cannot speak.  You suddenly have a very bad headache.  You suddenly cannot see. This information is not intended to replace advice given to you by your health care provider. Make sure you discuss any questions you have with your health care provider. Document Released: 06/13/2009 Document Revised: 08/25/2015 Document Reviewed: 08/27/2013 Elsevier Interactive Patient Education  2017 Elsevier Inc.     To measure for knee high compression hose: Measure the length of calf, largest circumference of calf, and ankle circumference first thing in the morning before your legs have a chance to swell.  Take these 3 measurements with you to obtain 20-30 mm mercury graduated knee high compression hose.  Put the stockings on in the morning, remove at bedtime.

## 2016-08-14 NOTE — Progress Notes (Signed)
[facility] [place] HISTORY AND PHYSICAL   MRN : [id number]  History of Present Illness:   Troy [name] is a 76 y.o. male patient of Dr. 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 He does have a known moderate sized infrarenal abdominal aortic aneurysm is seen for that. He is also status post bilateral extremity bypasses about 2000 and is here for followup of these as well.  He has had no recent worsening of his heart disease. He has no claudication symptoms and no symptoms referable to his aneurysm. He has tried knee high compression hose for lower legs swelling during the day and he states they make his legs hurt and swell more, he has no swelling in his legs in the morning. 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 He denies any history of stroke or TIA.  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   Pt Diabetic: Yes, states his A1C was 6.0, in good control Pt smoker: former smoker, quit in 1999  Pt meds include: Statin :Yes Betablocker: Yes ASA: Yes Other anticoagulants/antiplatelets: on coumadin due to recurrent right leg arterial stenoses per pt    Current Outpatient Prescriptions  Medication Sig Dispense Refill  . amLODipine (NORVASC) 10 MG tablet Take 10 mg by mouth daily.      [name] aspirin EC 81 MG EC tablet Take 1 tablet (81 mg total) by mouth daily. 30 tablet 3  . atorvastatin (LIPITOR) 40 MG tablet Take 1 tablet (40 mg total) by mouth daily. 90 tablet 1  . HYDROcodone-acetaminophen (VICODIN) 5-500 MG per tablet Take 1 tablet by mouth every 6 (six) hours as needed for pain.     . Isopropyl Alcohol (ALCOHOL PREP PADS EX)     . isosorbide mononitrate (IMDUR) 30 MG 24 hr tablet Take 1  tablet (30 mg total) by mouth daily. 90 tablet 1  . losartan (COZAAR) 100 MG tablet Take 100 mg by mouth daily.      . metoprolol tartrate (LOPRESSOR) 25 MG tablet Take 0.5 tablets (12.5 mg total) by mouth 2 (two) times daily. 90 tablet 1  . nitroGLYCERIN (NITROSTAT) 0.4 MG SL tablet Place 1 tablet (0.4 mg total) under the tongue every 5 (five) minutes x 3 doses as needed for chest pain. 25 tablet 3  . pantoprazole (PROTONIX) 40 MG tablet TAKE 1 TABLET EVERY DAY (Patient taking differently: TAKE 1 TABLET  EVERY DAY) 90 tablet 3  . tamsulosin (FLOMAX) 0.4 MG CAPS capsule     . tetrahydrozoline-zinc (VISINE-AC) 0.05-0.25 % ophthalmic solution 2 drops 3 (three) times daily as needed.    . warfarin (COUMADIN) 2 MG tablet Take 2 mg by mouth daily.    . fexofenadine (ALLEGRA) 180 MG tablet Take 180 mg by mouth daily.    . furosemide (LASIX) 40 MG tablet Take 40 mg by mouth daily.      . metFORMIN (GLUCOPHAGE) 1000 MG tablet Take 1 tablet (1,000 mg total) by mouth 2 (two) times daily with a meal.    . VENTOLIN HFA 108 (90 Base) MCG/ACT inhaler      No current facility-administered medications for this visit.     Past Medical History:  Diagnosis Date  . AAA (  abdominal aortic aneurysm) ([place])    Followed by Dr. Sherren Mocha Early  . Arthritis   . CAD (coronary artery disease)   . Cancer [facility])    Prostate:  Radiation Tx  . Carotid artery disease ([place])    Doppler 08/10/2010,  no change, RICA 2-37%, LICA 62-83%  . Chest pain    precordial. mild chronic .[name]... nonischemic  . Coronary artery disease    a. Nuclear, January, 2008, no ischemia b. Cath 08/2012- 1/4 patent grafts, RCA CTO, no flow-limiting disease, medically managed  . Diabetes mellitus without complication ([place])   . Dizziness 02/2011  . Dyslipidemia   . Edema   . Ejection fraction    Normal LV, nuclear, 2008, ( no echo data as July 31, 2010)  . GERD (gastroesophageal reflux disease)    TAKES TUMS & ROLAIDS AS NEEDED  . Hx of CABG 2000  .  Hypertension   . Myocardial infarction ([place]) 1999  . Neck pain 02/2011  . Pneumonia   . Renal artery stenosis ([facility])    50-70%  . S/P femoropopliteal bypass surgery    Dr. Donnetta Hutching  . Sinus bradycardia    Asymptomatic    Social History Social History  Substance Use Topics  . Smoking status: Former Smoker    Quit date: 04/02/1998  . Smokeless tobacco: Never Used  . Alcohol use 0.0 - 1.2 oz/week     Comment: OCCASIONAL    Family History Family History  Problem Relation Age of Onset  . Deep vein thrombosis Father   . Lung cancer Sister   . Diabetes Sister   . Heart disease Sister        After age 85  . Hyperlipidemia Sister   . Hypertension Sister   . Lung cancer Sister   . Breast cancer Sister   . Hypertension Mother   . Diabetes Sister   . Coronary artery disease Unknown        family hx of  . Cancer Brother        "crab cancer"  . Heart disease Brother   . Heart attack Brother   . Heart attack Daughter   . Colon cancer Neg Hx   . Stroke Neg Hx     Surgical History Past Surgical History:  Procedure Laterality Date  . CARDIAC CATHETERIZATION  09/26/2012   1/4 patent bypass (occluded SVG-PDA, SVG-OM, LIMA-LAD), SVG-diagonal patent and fills the diagonal and LAD, distal RCA occlusion with left to right collateralization, patent circumflex, LAD with no flow-limiting disease and antegrade flow competitively from SVG-diagonal; EF 60-65%  . COLONOSCOPY  11/30/2009   TDV:[id number]. next TCS 11/2019  . CORONARY ARTERY BYPASS GRAFT  2000  . ESOPHAGOGASTRODUODENOSCOPY N/A 08/12/2013   SWN:[id number] peptic stricture with erosive refluxesophagitis - status post Maloney dilation. Hiatal hernia. Abnormalgastric mucosa. Deformity of the pyloric channel suggestive ofprior peptic ulcer disease. Duodenal bulbar diverticulum Statuspost gastric biopsy. h.pylori  . LEFT HEART CATHETERIZATION WITH CORONARY/GRAFT ANGIOGRAM N/A 09/26/2012   Procedure: LEFT HEART CATHETERIZATION WITH  Beatrix Fetters;  Surgeon: Burnell Blanks, MD;  Location: [facility] CATH LAB;  Service: Cardiovascular;  Laterality: N/A;  . Venia Minks DILATION N/A 08/12/2013   Procedure: Venia Minks DILATION;  Surgeon: Daneil Dolin, MD;  Location: [facility] ENDO SUITE;  Service: Endoscopy;  Laterality: N/A;  . PR VEIN BYPASS GRAFT,AORTO-FEM-POP Right 07/19/1999  . PR VEIN BYPASS GRAFT,AORTO-FEM-POP Left 05/02/2006  . SAVORY DILATION N/A 08/12/2013   Procedure: SAVORY DILATION;  Surgeon: Daneil Dolin, MD;  Location: [facility] ENDO SUITE;  Service: Endoscopy;  Laterality: N/A;  . WRIST SURGERY     cyst removal    Allergies  Allergen Reactions  . Niacin Itching and Rash    Burning sensation  . Iodine-131 Nausea And Vomiting  . Nsaids Other (See Comments)    Taking Coumadin    Current Outpatient Prescriptions  Medication Sig Dispense Refill  . amLODipine (NORVASC) 10 MG tablet Take 10 mg by mouth daily.      [name] aspirin EC 81 MG EC tablet Take 1 tablet (81 mg total) by mouth daily. 30 tablet 3  . atorvastatin (LIPITOR) 40 MG tablet Take 1 tablet (40 mg total) by mouth daily. 90 tablet 1  . HYDROcodone-acetaminophen (VICODIN) 5-500 MG per tablet Take 1 tablet by mouth every 6 (six) hours as needed for pain.     . Isopropyl Alcohol (ALCOHOL PREP PADS EX)     . isosorbide mononitrate (IMDUR) 30 MG 24 hr tablet Take 1 tablet (30 mg total) by mouth daily. 90 tablet 1  . losartan (COZAAR) 100 MG tablet Take 100 mg by mouth daily.      . metoprolol tartrate (LOPRESSOR) 25 MG tablet Take 0.5 tablets (12.5 mg total) by mouth 2 (two) times daily. 90 tablet 1  . nitroGLYCERIN (NITROSTAT) 0.4 MG SL tablet Place 1 tablet (0.4 mg total) under the tongue every 5 (five) minutes x 3 doses as needed for chest pain. 25 tablet 3  . pantoprazole (PROTONIX) 40 MG tablet TAKE 1 TABLET EVERY DAY (Patient taking differently: TAKE 1 TABLET  EVERY DAY) 90 tablet 3  . tamsulosin (FLOMAX) 0.4 MG CAPS capsule     . tetrahydrozoline-zinc  (VISINE-AC) 0.05-0.25 % ophthalmic solution 2 drops 3 (three) times daily as needed.    . warfarin (COUMADIN) 2 MG tablet Take 2 mg by mouth daily.    . fexofenadine (ALLEGRA) 180 MG tablet Take 180 mg by mouth daily.    . furosemide (LASIX) 40 MG tablet Take 40 mg by mouth daily.      . metFORMIN (GLUCOPHAGE) 1000 MG tablet Take 1 tablet (1,000 mg total) by mouth 2 (two) times daily with a meal.    . VENTOLIN HFA 108 (90 Base) MCG/ACT inhaler      No current facility-administered medications for this visit.      REVIEW OF SYSTEMS: See HPI for pertinent positives and negatives.  Physical Examination Vitals:   08/14/16 1102  BP: 119/66  Pulse: (!) 56  Resp: 16  Temp: (!) 96.9 F (36.1 C)  SpO2: 97%  Weight: 220 lb (99.8 kg)  Height: 5' 10.5" (1.791 m)   Body mass index is 31.12 kg/m.  General: WDWN in obese male in NAD Gait: Normal HENT: WNL Eyes: Pupils equal Pulmonary: normal non-labored breathing, diminished air movement in all fields, no adventitious sounds  Cardiac: RRR, no murmur detected  Abdomen: soft, NT, no masses palpated Skin: no rashes, no ulcers, no cellulitis   VASCULAR EXAM  Carotid Bruits Right Left   Negative Negative   Aorta is not palpable Radial pulses are 2+ palpable and =   VASCULAR EXAM: Extremities without ischemic changes, without Gangrene; without open wounds.     LE Pulses Right Left   FEMORAL not palpable 2+ palpable    POPLITEAL not palpable  not palpable   POSTERIOR TIBIAL not palpable  2+ palpable    DORSALIS PEDIS  ANTERIOR TIBIAL not palpable  1+ palpable     Musculoskeletal: no muscle wasting or atrophy; trace pitting  edema in right lower leg,  1+ pitting and non pitting edema in left lower  leg. Neurologic: A&O X 3; Appropriate Affect ;  SENSATION: normal; MOTOR FUNCTION: 5/5 Symmetric, CN 2-12 intact except for some hearing loss, Speech is fluent/normal    ASSESSMENT:  Troy [name] is a 76 y.o. male who is being followed for a moderate sized infrarenal abdominal aortic aneurysm and who is also s/p right femoral to above knee popliteal bypass graft with Gortex on 07/19/1999, and left femoral to below knee popliteal artery bypass graft on 04/12/2006.  He has a known occlusion of the right fem-pop bypass graft. He has no symptoms referable to his aneurysm.  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   Chronic venous insufficiency: He has tried knee high compression hose for lower legs swelling during the day and he states they make his legs hurt and swell more, he has no swelling in his legs in the morning. Today he has minimal edema in his lower legs.   His atherosclerotic risk factors include stable and well controlled DM, former smoker, CAD, dyslipidemia, and obesity. His hypertension is well controlled.    DATA  AAA Duplex: Tandem abdominal aortic aneurysm with the proximal fusiform aneurysm measuring 4.01 cm x 3.87 cm and the distal saccular aneurysm measuring 4.38 cm x 4.07 cm. Right CIA: 1.66 cm; Left CIA: 1.41 cm. No significant change compared to the exam on 08-02-15.  Left LE Arterial Duplex: Widely patent left fem-pop graft with evidence of 50-74% native disease in CFA inflow (highest velocity is 348 cm/s, was 342 cm/s)  ABI:  R: 1.12 ([place], 08-02-15), DP: monophasic, PT: monophasic, TBI: 0.53 (was 0.54)       L: 1.04 (1.20, 08-02-15), DP: biphasic, PT: biphasic, TBI: 0.78(was 0.88)  PLAN:   Continue graduated walking program. Based on today's exam and non-invasive vascular lab results, the patient will follow up  in 1 year with the following tests: left LE arterial Duplex, ABI's, and AAA Duplex.   I discussed in depth with the patient the nature of atherosclerosis, and emphasized the importance of maximal medical management including strict control of blood pressure, blood glucose, and lipid levels, obtaining regular exercise, and cessation of smoking.  The patient is aware that without maximal medical management the underlying atherosclerotic disease process will progress, limiting the benefit of any interventions. Consideration for repair of AAA would be made when the size approaches 4.8 or 5.0 cm, growth > 1 cm/yr, and symptomatic status.  The patient was given information about AAA including signs, symptoms, treatment,  what symptoms should prompt the patient to seek immediate medical care, and how to minimize the risk of enlargement and rupture of aneurysms. The patient was given information about PAD including signs, symptoms, treatment, what symptoms should prompt the patient to seek immediate medical care, and risk reduction measures to take. Thank you for allowing [place] to participate in this patient's care.  Clemon Chambers, RN, MSN, FNP-C [facility] Office: [phone]  Clinic MD: Early 08/14/2016 12:53 PM

## 2016-08-14 NOTE — Progress Notes (Signed)
[facility] [place] HISTORY AND PHYSICAL   MRN : [id number]  History of Present Illness:   Troy [name] is a 76 y.o. male patient of Dr. 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 He does have a known moderate sized infrarenal abdominal aortic aneurysm is seen for that. He is also status post bilateral extremity bypasses about 2000 and is here for followup of these as well.  He has had no recent worsening of his heart disease. He has no claudication symptoms and no symptoms referable to his aneurysm. He has tried knee high compression hose for lower legs swelling during the day and he states they make his legs hurt and swell more, he has no swelling in his legs in the morning. 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 He denies any history of stroke or TIA.  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 He had also been having aspiration pneumonia until his esophagus was dilated.    Pt Diabetic: Yes, states his A1C was 6.4, in good control Pt smoker: former smoker, quit in 1999  Pt meds include: Statin :Yes Betablocker: Yes ASA: Yes Other anticoagulants/antiplatelets: on coumadin due to recurrent right leg arterial stenoses per pt    Current Outpatient Prescriptions  Medication Sig Dispense Refill  . amLODipine (NORVASC) 10 MG tablet Take 10 mg by mouth daily.      [name] aspirin EC 81 MG EC tablet Take 1 tablet (81 mg total) by mouth daily. 30 tablet 3  . atorvastatin (LIPITOR) 40 MG tablet Take 1 tablet (40 mg total) by mouth daily. 90 tablet 1  . HYDROcodone-acetaminophen (VICODIN) 5-500 MG per tablet Take 1 tablet by mouth every 6 (six) hours as needed for pain.     . Isopropyl Alcohol (ALCOHOL  PREP PADS EX)     . isosorbide mononitrate (IMDUR) 30 MG 24 hr tablet Take 1 tablet (30 mg total) by mouth daily. 90 tablet 1  . losartan (COZAAR) 100 MG tablet Take 100 mg by mouth daily.      . metoprolol tartrate (LOPRESSOR) 25 MG tablet Take 0.5 tablets (12.5 mg total) by mouth 2 (two) times daily. 90 tablet 1  . nitroGLYCERIN (NITROSTAT) 0.4 MG SL tablet Place 1 tablet (0.4 mg total) under the tongue every 5 (five) minutes x 3 doses as needed for chest pain. 25 tablet 3  . pantoprazole (PROTONIX) 40 MG tablet TAKE 1 TABLET EVERY DAY (Patient taking differently: TAKE 1 TABLET  EVERY DAY) 90 tablet 3  . tamsulosin (FLOMAX) 0.4 MG CAPS capsule     . tetrahydrozoline-zinc (VISINE-AC) 0.05-0.25 % ophthalmic solution 2 drops 3 (three) times daily as needed.    . warfarin (COUMADIN) 2 MG tablet Take 2 mg by mouth daily.    . fexofenadine (ALLEGRA) 180 MG tablet Take 180 mg by mouth daily.    . furosemide (LASIX) 40 MG tablet Take 40 mg by mouth daily.      . metFORMIN (GLUCOPHAGE) 1000 MG tablet Take 1 tablet (1,000 mg total) by mouth 2 (two) times daily with a meal.    . VENTOLIN HFA 108 (90 Base) MCG/ACT inhaler      No current facility-administered medications for this visit.  Past Medical History:  Diagnosis Date  . AAA (abdominal aortic aneurysm) ([place])    Followed by Dr. Curt Jews  . Arthritis   . CAD (coronary artery disease)   . Cancer [facility])    Prostate:  Radiation Tx  . Carotid artery disease ([place])    Doppler 08/10/2010,  no change, RICA 3-55%, LICA 73-22%  . Chest pain    precordial. mild chronic .[name]... nonischemic  . Coronary artery disease    a. Nuclear, January, 2008, no ischemia b. Cath 08/2012- 1/4 patent grafts, RCA CTO, no flow-limiting disease, medically managed  . Diabetes mellitus without complication ([place])   . Dizziness 02/2011  . Dyslipidemia   . Edema   . Ejection fraction    Normal LV, nuclear, 2008, ( no echo data as July 31, 2010)  . GERD  (gastroesophageal reflux disease)    TAKES TUMS & ROLAIDS AS NEEDED  . Hx of CABG 2000  . Hypertension   . Myocardial infarction ([place]) 1999  . Neck pain 02/2011  . Pneumonia   . Renal artery stenosis (H[facility])    50-70%  . S/P femoropopliteal bypass surgery    Dr. Donnetta Hutching  . Sinus bradycardia    Asymptomatic    Social History Social History  Substance Use Topics  . Smoking status: Former Smoker    Quit date: 04/02/1998  . Smokeless tobacco: Never Used  . Alcohol use 0.0 - 1.2 oz/week     Comment: OCCASIONAL    Family History Family History  Problem Relation Age of Onset  . Deep vein thrombosis Father   . Lung cancer Sister   . Diabetes Sister   . Heart disease Sister        After age 68  . Hyperlipidemia Sister   . Hypertension Sister   . Lung cancer Sister   . Breast cancer Sister   . Hypertension Mother   . Diabetes Sister   . Coronary artery disease Unknown        family hx of  . Cancer Brother        "crab cancer"  . Heart disease Brother   . Heart attack Brother   . Heart attack Daughter   . Colon cancer Neg Hx   . Stroke Neg Hx     Surgical History Past Surgical History:  Procedure Laterality Date  . CARDIAC CATHETERIZATION  09/26/2012   1/4 patent bypass (occluded SVG-PDA, SVG-OM, LIMA-LAD), SVG-diagonal patent and fills the diagonal and LAD, distal RCA occlusion with left to right collateralization, patent circumflex, LAD with no flow-limiting disease and antegrade flow competitively from SVG-diagonal; EF 60-65%  . COLONOSCOPY  11/30/2009   GUR:[id number]. next TCS 11/2019  . CORONARY ARTERY BYPASS GRAFT  2000  . ESOPHAGOGASTRODUODENOSCOPY N/A 08/12/2013   VVO:[id number] peptic stricture with erosive refluxesophagitis - status post Maloney dilation. Hiatal hernia. Abnormalgastric mucosa. Deformity of the pyloric channel suggestive ofprior peptic ulcer disease. Duodenal bulbar diverticulum Statuspost gastric biopsy. h.pylori  . LEFT HEART  CATHETERIZATION WITH CORONARY/GRAFT ANGIOGRAM N/A 09/26/2012   Procedure: LEFT HEART CATHETERIZATION WITH Beatrix Fetters;  Surgeon: Burnell Blanks, MD;  Location: [facility] CATH LAB;  Service: Cardiovascular;  Laterality: N/A;  . Venia Minks DILATION N/A 08/12/2013   Procedure: Venia Minks DILATION;  Surgeon: Daneil Dolin, MD;  [facility];  Service: Endoscopy;  Laterality: N/A;  . PR VEIN BYPASS GRAFT,AORTO-FEM-POP Right 07/19/1999  . PR VEIN BYPASS GRAFT,AORTO-FEM-POP Left 05/02/2006  . SAVORY DILATION N/A 08/12/2013   Procedure: SAVORY DILATION;  Surgeon:  Daneil Dolin, MD;  Location: [facility] ENDO SUITE;  Service: Endoscopy;  Laterality: N/A;  . WRIST SURGERY     cyst removal    Allergies  Allergen Reactions  . Niacin Itching and Rash    Burning sensation  . Iodine-131 Nausea And Vomiting  . Nsaids Other (See Comments)    Taking Coumadin    Current Outpatient Prescriptions  Medication Sig Dispense Refill  . amLODipine (NORVASC) 10 MG tablet Take 10 mg by mouth daily.      [name] aspirin EC 81 MG EC tablet Take 1 tablet (81 mg total) by mouth daily. 30 tablet 3  . atorvastatin (LIPITOR) 40 MG tablet Take 1 tablet (40 mg total) by mouth daily. 90 tablet 1  . HYDROcodone-acetaminophen (VICODIN) 5-500 MG per tablet Take 1 tablet by mouth every 6 (six) hours as needed for pain.     . Isopropyl Alcohol (ALCOHOL PREP PADS EX)     . isosorbide mononitrate (IMDUR) 30 MG 24 hr tablet Take 1 tablet (30 mg total) by mouth daily. 90 tablet 1  . losartan (COZAAR) 100 MG tablet Take 100 mg by mouth daily.      . metoprolol tartrate (LOPRESSOR) 25 MG tablet Take 0.5 tablets (12.5 mg total) by mouth 2 (two) times daily. 90 tablet 1  . nitroGLYCERIN (NITROSTAT) 0.4 MG SL tablet Place 1 tablet (0.4 mg total) under the tongue every 5 (five) minutes x 3 doses as needed for chest pain. 25 tablet 3  . pantoprazole (PROTONIX) 40 MG tablet TAKE 1 TABLET EVERY DAY (Patient taking differently: TAKE 1  TABLET  EVERY DAY) 90 tablet 3  . tamsulosin (FLOMAX) 0.4 MG CAPS capsule     . tetrahydrozoline-zinc (VISINE-AC) 0.05-0.25 % ophthalmic solution 2 drops 3 (three) times daily as needed.    . warfarin (COUMADIN) 2 MG tablet Take 2 mg by mouth daily.    . fexofenadine (ALLEGRA) 180 MG tablet Take 180 mg by mouth daily.    . furosemide (LASIX) 40 MG tablet Take 40 mg by mouth daily.      . metFORMIN (GLUCOPHAGE) 1000 MG tablet Take 1 tablet (1,000 mg total) by mouth 2 (two) times daily with a meal.    . VENTOLIN HFA 108 (90 Base) MCG/ACT inhaler      No current facility-administered medications for this visit.      REVIEW OF SYSTEMS: See HPI for pertinent positives and negatives.  Physical Examination Vitals:   08/14/16 1102  BP: 119/66  Pulse: (!) 56  Resp: 16  Temp: (!) 96.9 F (36.1 C)  SpO2: 97%  Weight: 220 lb (99.8 kg)  Height: 5' 10.5" (1.791 m)   Body mass index is 31.12 kg/m.  General: WDWN in obese male in NAD Gait: Normal HENT: WNL Eyes: Pupils equal Pulmonary: normal non-labored breathing, diminished air movement in all fields, no adventitious sounds  Cardiac: Regular rhythm, bradycardic, no murmur detected  Abdomen: soft, NT, no masses palpated Skin: no rashes, no ulcers, no cellulitis   VASCULAR EXAM  Carotid Bruits Right Left   Negative Negative   Aorta is not palpable Radial pulses are 2+ palpable and =   VASCULAR EXAM: Extremities without ischemic changes, without Gangrene; without open wounds.     LE Pulses Right Left   FEMORAL faintly palpable 2+ palpable    POPLITEAL  palpable   palpable   POSTERIOR TIBIAL not palpable  2+ palpable    DORSALIS PEDIS  ANTERIOR TIBIAL 1+ palpable  1+ palpable  Musculoskeletal: no  muscle wasting or atrophy;  1+ pitting and non pitting edema in right lower leg, 2-3+ in left lower leg. Neurologic: A&O X 3; Appropriate Affect ;  SENSATION: normal; MOTOR FUNCTION: 5/5 Symmetric, CN 2-12 intact except for some hearing loss, Speech is fluent/normal    ASSESSMENT:  Troy [name] is a 76 y.o. male who is being followed for a moderate sized infrarenal abdominal aortic aneurysm and who is also s/p right femoral to above knee popliteal bypass graft with Gortex on 07/19/1999, and left femoral to below knee popliteal artery bypass graft on 04/12/2006.  He has a known occlusion of the right fem-pop bypass graft, but has adequate collateral perfusion. He has no symptoms referable to his aneurysm.  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   Chronic venous insufficiency: He has tried knee high compression hose for lower legs swelling during the day and he states they make his legs hurt and swell more, he has no swelling in his legs in the morning. Today he has minimal edema in his lower legs.   His atherosclerotic risk factors include stable and well controlled DM, former smoker, CAD, dyslipidemia, and obesity. His hypertension is well controlled.    DATA  AAA Duplex: Tandem abdominal aortic aneurysm with the proximal fusiform aneurysm measuring 4.01 cm x 3.87 and the distal saccular aneurysm measuring 4.38 cm x 4.07 cm. Right CIA: 1.66 cm; Left CIA: 1.41 cm No significant change compared to the last exam on 08-02-15.  Left LE Arterial Duplex: Patent left fem-pop graft with evidence of 50-74% native disease in CFA inflow (highest velocity is 348 cm/s, was 342 cm/s on 08-02-15). No significant change compared to the last exam on 08-02-15.   ABI:  R: 1.12 ([place], 08-02-15), DP: monophasic, PT: monophasic, TBI: 0.53 (was 0.54)       L:  1.04 (was 1.2), DP: biphasic, PT: biphasic, TBI: 0.78 (was 0.88)  PLAN:   Continue graduated walking program. Based on today's exam and non-invasive vascular lab results, the patient will follow up in 1 year with the following tests: left LE arterial Duplex, ABI's, and AAA Duplex.   I discussed in depth with the patient the nature of atherosclerosis, and emphasized the importance of maximal medical management including strict control of blood pressure, blood glucose, and lipid levels, obtaining regular exercise, and cessation of smoking.  The patient is aware that without maximal medical management the underlying atherosclerotic disease process will progress, limiting the benefit of any interventions. Consideration for repair of AAA would be made when the size is 5.0 cm, growth > 1 cm/yr, and symptomatic status.  The patient was given information about AAA including signs, symptoms, treatment,  what symptoms should prompt the patient to seek immediate medical care, and how to minimize the risk of enlargement and rupture of aneurysms. The patient was given information about PAD including signs, symptoms, treatment, what symptoms should prompt the patient to seek immediate medical care, and risk reduction measures to take. Thank you for allowing [place] to participate in this patient's care.  Clemon Chambers, RN, MSN, FNP-C [facility] Office: [phone]  Clinic MD: Early 08/14/2016 11:21 AM

## 2016-08-17 NOTE — Addendum Note (Signed)
Addended by: Lianne Cure A on: 08/17/2016 12:07 PM   Modules accepted: Orders

## 2016-09-27 DIAGNOSIS — Z7901 Long term (current) use of anticoagulants: Secondary | ICD-10-CM | POA: Diagnosis not present

## 2016-10-04 DIAGNOSIS — Z7901 Long term (current) use of anticoagulants: Secondary | ICD-10-CM | POA: Diagnosis not present

## 2016-10-10 DIAGNOSIS — Z7901 Long term (current) use of anticoagulants: Secondary | ICD-10-CM | POA: Diagnosis not present

## 2016-10-12 ENCOUNTER — Ambulatory Visit (INDEPENDENT_AMBULATORY_CARE_PROVIDER_SITE_OTHER): Payer: Medicare HMO | Admitting: Cardiovascular Disease

## 2016-10-12 ENCOUNTER — Encounter: Payer: Self-pay | Admitting: Cardiovascular Disease

## 2016-10-12 VITALS — BP 124/54 | HR 61 | Ht 70.0 in | Wt 223.4 lb

## 2016-10-12 DIAGNOSIS — R600 Localized edema: Secondary | ICD-10-CM | POA: Diagnosis not present

## 2016-10-12 DIAGNOSIS — I714 Abdominal aortic aneurysm, without rupture, unspecified: Secondary | ICD-10-CM

## 2016-10-12 DIAGNOSIS — I6523 Occlusion and stenosis of bilateral carotid arteries: Secondary | ICD-10-CM

## 2016-10-12 DIAGNOSIS — Z95828 Presence of other vascular implants and grafts: Secondary | ICD-10-CM | POA: Diagnosis not present

## 2016-10-12 DIAGNOSIS — I4891 Unspecified atrial fibrillation: Secondary | ICD-10-CM

## 2016-10-12 DIAGNOSIS — Z87891 Personal history of nicotine dependence: Secondary | ICD-10-CM

## 2016-10-12 DIAGNOSIS — I739 Peripheral vascular disease, unspecified: Secondary | ICD-10-CM | POA: Diagnosis not present

## 2016-10-12 DIAGNOSIS — I10 Essential (primary) hypertension: Secondary | ICD-10-CM | POA: Diagnosis not present

## 2016-10-12 DIAGNOSIS — I25708 Atherosclerosis of coronary artery bypass graft(s), unspecified, with other forms of angina pectoris: Secondary | ICD-10-CM

## 2016-10-12 NOTE — Patient Instructions (Addendum)
Your physician wants you to follow-up in: 1 year Dr Virgina Jock will receive a reminder letter in the mail two months in advance. If you don't receive a letter, please call our office to schedule the follow-up appointment.     Your physician recommends that you continue on your current medications as directed. Please refer to the Current Medication list given to you today.     If you need a refill on your cardiac medications before your next appointment, please call your pharmacy.      Your physician has requested that you have a carotid duplex. This test is an ultrasound of the carotid arteries in your neck. It looks at blood flow through these arteries that supply the brain with blood. Allow one hour for this exam. There are no restrictions or special instructions.         Thank you for choosing [facility] !

## 2016-10-12 NOTE — Progress Notes (Signed)
SUBJECTIVE: The patient presents for routine follow-up. His wife, Mardene Celeste, is also my patient. He has a history of coronary artery disease and bypass graft surgery. Coronary angiography on 09/26/2012 demonstrated one out of 4 patent bypass grafts. He also has a history of atrial fibrillation , hypertension, dyslipidemia, carotid artery disease, and peripheral vascular disease.  Echocardiogram 06/21/15: Normal left ventricular systolic function and regional wall motion, LVEF 55-60%, mild LVH, mild mitral regurgitation, severe left atrial dilatation, mild to moderate right ventricular and mild right atrial enlargement, moderately elevated pulmonary pressures, 59 mmHg.  He underwent left femoral-popliteal bypass surgery on 04/12/06 and right femoral-popliteal bypass surgery on 07/19/99. The right-sided graft is known to be occluded.  He also has an infrarenal abdominal aortic aneurysm.  He has chronic venous insufficiency with bilateral leg swelling. It did not make a difference whether he took Lasix or not. He says the swelling does not bother him. He does not like wearing compression stockings.  He denies chest pain, palpitations, and shortness of breath.  He has had some left sided neck pain for the past week and wonders if it is muscular in etiology.   Review of Systems: As per "subjective", otherwise negative.  Allergies  Allergen Reactions  . Niacin Itching and Rash    Burning sensation  . Iodine-131 Nausea And Vomiting  . Nsaids Other (See Comments)    Taking Coumadin    Current Outpatient Prescriptions  Medication Sig Dispense Refill  . amLODipine (NORVASC) 10 MG tablet Take 10 mg by mouth daily.      [name] aspirin EC 81 MG EC tablet Take 1 tablet (81 mg total) by mouth daily. 30 tablet 3  . atorvastatin (LIPITOR) 40 MG tablet Take 1 tablet (40 mg total) by mouth daily. 90 tablet 1  . fexofenadine (ALLEGRA) 180 MG tablet Take 180 mg by mouth daily.    . furosemide (LASIX) 40  MG tablet Take 40 mg by mouth daily.      [name] HYDROcodone-acetaminophen (VICODIN) 5-500 MG per tablet Take 1 tablet by mouth every 6 (six) hours as needed for pain.     . Isopropyl Alcohol (ALCOHOL PREP PADS EX)     . isosorbide mononitrate (IMDUR) 30 MG 24 hr tablet Take 1 tablet (30 mg total) by mouth daily. 90 tablet 1  . losartan (COZAAR) 100 MG tablet Take 100 mg by mouth daily.      . metFORMIN (GLUCOPHAGE) 1000 MG tablet Take 1 tablet (1,000 mg total) by mouth 2 (two) times daily with a meal.    . metoprolol tartrate (LOPRESSOR) 25 MG tablet Take 0.5 tablets (12.5 mg total) by mouth 2 (two) times daily. 90 tablet 1  . nitroGLYCERIN (NITROSTAT) 0.4 MG SL tablet Place 1 tablet (0.4 mg total) under the tongue every 5 (five) minutes x 3 doses as needed for chest pain. 25 tablet 3  . pantoprazole (PROTONIX) 40 MG tablet TAKE 1 TABLET EVERY DAY (Patient taking differently: TAKE 1 TABLET  EVERY DAY) 90 tablet 3  . tamsulosin (FLOMAX) 0.4 MG CAPS capsule     . tetrahydrozoline-zinc (VISINE-AC) 0.05-0.25 % ophthalmic solution 2 drops 3 (three) times daily as needed.    . warfarin (COUMADIN) 2 MG tablet Take 2 mg by mouth daily.     No current facility-administered medications for this visit.     Past Medical History:  Diagnosis Date  . AAA (abdominal aortic aneurysm) ([place])    Followed by Dr. Curt Jews  .  Arthritis   . CAD (coronary artery disease)   . Cancer [facility])    Prostate:  Radiation Tx  . Carotid artery disease ([place])    Doppler 08/10/2010,  no change, RICA 6-83%, LICA 41-96%  . Chest pain    precordial. mild chronic .[name]... nonischemic  . Coronary artery disease    a. Nuclear, January, 2008, no ischemia b. Cath 08/2012- 1/4 patent grafts, RCA CTO, no flow-limiting disease, medically managed  . Diabetes mellitus without complication ([place])   . Dizziness 02/2011  . Dyslipidemia   . Edema   . Ejection fraction    Normal LV, nuclear, 2008, ( no echo data as July 31, 2010)  . GERD  (gastroesophageal reflux disease)    TAKES TUMS & ROLAIDS AS NEEDED  . Hx of CABG 2000  . Hypertension   . Myocardial infarction ([place]) 1999  . Neck pain 02/2011  . Pneumonia   . Renal artery stenosis ([facility])    50-70%  . S/P femoropopliteal bypass surgery    Dr. Donnetta Hutching  . Sinus bradycardia    Asymptomatic    Past Surgical History:  Procedure Laterality Date  . CARDIAC CATHETERIZATION  09/26/2012   1/4 patent bypass (occluded SVG-PDA, SVG-OM, LIMA-LAD), SVG-diagonal patent and fills the diagonal and LAD, distal RCA occlusion with left to right collateralization, patent circumflex, LAD with no flow-limiting disease and antegrade flow competitively from SVG-diagonal; EF 60-65%  . COLONOSCOPY  11/30/2009   QIW:[id number]. next TCS 11/2019  . CORONARY ARTERY BYPASS GRAFT  2000  . ESOPHAGOGASTRODUODENOSCOPY N/A 08/12/2013   CXK:[id number] peptic stricture with erosive refluxesophagitis - status post Maloney dilation. Hiatal hernia. Abnormalgastric mucosa. Deformity of the pyloric channel suggestive ofprior peptic ulcer disease. Duodenal bulbar diverticulum Statuspost gastric biopsy. h.pylori  . LEFT HEART CATHETERIZATION WITH CORONARY/GRAFT ANGIOGRAM N/A 09/26/2012   Procedure: LEFT HEART CATHETERIZATION WITH Beatrix Fetters;  Surgeon: Burnell Blanks, MD;  Location: [facility] CATH LAB;  Service: Cardiovascular;  Laterality: N/A;  . Venia Minks DILATION N/A 08/12/2013   Procedure: Venia Minks DILATION;  Surgeon: Daneil Dolin, MD;  [facility];  Service: Endoscopy;  Laterality: N/A;  . PR VEIN BYPASS GRAFT,AORTO-FEM-POP Right 07/19/1999  . PR VEIN BYPASS GRAFT,AORTO-FEM-POP Left 05/02/2006  . SAVORY DILATION N/A 08/12/2013   Procedure: SAVORY DILATION;  Surgeon: Daneil Dolin, MD;  [facility];  Service: Endoscopy;  Laterality: N/A;  . WRIST SURGERY     cyst removal    Social History   Social History  . Marital status: Married    Spouse name: N/A  .  Number of children: N/A  . Years of education: N/A   Occupational History  . Not on file.   Social History Main Topics  . Smoking status: Former Smoker    Quit date: 04/02/1998  . Smokeless tobacco: Never Used  . Alcohol use 0.0 - 1.2 oz/week     Comment: OCCASIONAL  . Drug use: No  . Sexual activity: Not on file   Other Topics Concern  . Not on file   Social History Narrative  . No narrative on file     Vitals:   10/12/16 1024  BP: (!) 124/54  Pulse: 61  SpO2: 96%  Weight: 223 lb 6.4 oz (101.3 kg)  Height: 5\' 10"  (1.778 m)    Wt Readings from Last 3 Encounters:  10/12/16 223 lb 6.4 oz (101.3 kg)  08/14/16 220 lb (99.8 kg)  03/19/16 222 lb 9.6 oz (101 kg)     PHYSICAL  EXAM General: NAD HEENT: Normal. Neck: No JVD, no thyromegaly. Lungs: Diminished throughout, no crackles or wheezes. CV: Nondisplaced PMI.  Regular rate and rhythm, normal S1/S2, no S3/S4, no murmur. 2+ b/l leg edema (chronic) Abdomen: Soft, nontender, no distention.  Neurologic: Alert and oriented.  Psych: Normal affect. Skin: Normal. Musculoskeletal: No gross deformities.    ECG: Most recent ECG reviewed.   Labs: Lab Results  Component Value Date/Time   K 3.7 09/25/2012 06:18 AM   BUN 10 09/25/2012 06:18 AM   CREATININE 0.80 03/25/2015 11:03 AM   ALT 20 09/09/2007 08:38 AM   TSH 0.531 09/24/2012 04:48 PM   HGB 13.8 09/27/2012 05:40 AM     Lipids: Lab Results  Component Value Date/Time   LDLCALC 68 09/25/2012 06:18 AM   CHOL 129 09/25/2012 06:18 AM   TRIG 152 (H) 09/25/2012 06:18 AM   HDL 31 (L) 09/25/2012 06:18 AM       ASSESSMENT AND PLAN:  1. CAD with CABG and 1/4 patent bypass grafts in 08/2012: Symptomatically stable. Continue ASA, statin, Imdur, and metoprolol.  2. Paroxysmal atrial fibrillation: Continue therapy with metoprolol and warfarin. Currently in a regular rhythm.  3. Essential HTN: Controlled. No changes.  4. Bilateral carotid artery stenosis: Followed  by vascular surgery. Continue ASA and statin. 40-59% bilateral carotid artery stenosis in September 2016. I will repeat Dopplers.  5. PVD with bilateral femoral-popliteal bypass graft surgery as detailed above: Followed by vascular surgery. Continue aspirin and statin.  6. Bilateral leg edema/chronic venous insufficiency: Lasix did not make a difference. He does not like wearing compression stockings. It is not bothersome to him. There is no sign of infection.    Disposition: Follow up 1 yr   Kate Sable, M.D., F.A.C.C.

## 2016-10-17 ENCOUNTER — Ambulatory Visit (HOSPITAL_COMMUNITY): Payer: Medicare HMO

## 2016-11-15 DIAGNOSIS — R490 Dysphonia: Secondary | ICD-10-CM | POA: Diagnosis not present

## 2016-11-15 DIAGNOSIS — R1313 Dysphagia, pharyngeal phase: Secondary | ICD-10-CM | POA: Diagnosis not present

## 2016-11-15 DIAGNOSIS — K229 Disease of esophagus, unspecified: Secondary | ICD-10-CM | POA: Diagnosis not present

## 2016-11-15 DIAGNOSIS — R1310 Dysphagia, unspecified: Secondary | ICD-10-CM | POA: Diagnosis not present

## 2016-11-15 DIAGNOSIS — J3801 Paralysis of vocal cords and larynx, unilateral: Secondary | ICD-10-CM | POA: Diagnosis not present

## 2016-11-15 DIAGNOSIS — R633 Feeding difficulties: Secondary | ICD-10-CM | POA: Diagnosis not present

## 2016-12-05 ENCOUNTER — Telehealth: Payer: Self-pay

## 2016-12-05 ENCOUNTER — Ambulatory Visit (HOSPITAL_COMMUNITY)
Admission: RE | Admit: 2016-12-05 | Discharge: 2016-12-05 | Disposition: A | Discharge status: Home / Self Care | Payer: Medicare HMO | Source: Ambulatory Visit | Attending: Cardiovascular Disease | Admitting: Cardiovascular Disease

## 2016-12-05 DIAGNOSIS — I6523 Occlusion and stenosis of bilateral carotid arteries: Secondary | ICD-10-CM | POA: Insufficient documentation

## 2016-12-05 DIAGNOSIS — I63233 Cerebral infarction due to unspecified occlusion or stenosis of bilateral carotid arteries: Secondary | ICD-10-CM | POA: Diagnosis not present

## 2016-12-05 NOTE — Telephone Encounter (Signed)
Called pt,. No answer, left message for pt. To return call.

## 2016-12-05 NOTE — Telephone Encounter (Signed)
-----   Message from Laurine Blazer, LPN sent at 07/06/6254  2:50 PM EDT -----   ----- Message ----- From: Herminio Commons, MD Sent: 12/05/2016   1:37 PM To: Laurine Blazer, LPN  Obtain copy of most recent BMET. Obtain CT angio of neck for a more detailed analysis of carotid artery disease.

## 2016-12-06 DIAGNOSIS — Z7901 Long term (current) use of anticoagulants: Secondary | ICD-10-CM | POA: Diagnosis not present

## 2016-12-10 DIAGNOSIS — Z6833 Body mass index (BMI) 33.0-33.9, adult: Secondary | ICD-10-CM | POA: Diagnosis not present

## 2016-12-10 DIAGNOSIS — I2510 Atherosclerotic heart disease of native coronary artery without angina pectoris: Secondary | ICD-10-CM | POA: Diagnosis not present

## 2016-12-10 DIAGNOSIS — E118 Type 2 diabetes mellitus with unspecified complications: Secondary | ICD-10-CM | POA: Diagnosis not present

## 2016-12-10 DIAGNOSIS — G8929 Other chronic pain: Secondary | ICD-10-CM | POA: Diagnosis not present

## 2016-12-10 DIAGNOSIS — I10 Essential (primary) hypertension: Secondary | ICD-10-CM | POA: Diagnosis not present

## 2016-12-10 DIAGNOSIS — I4891 Unspecified atrial fibrillation: Secondary | ICD-10-CM | POA: Diagnosis not present

## 2016-12-10 DIAGNOSIS — Z23 Encounter for immunization: Secondary | ICD-10-CM | POA: Diagnosis not present

## 2016-12-10 DIAGNOSIS — L720 Epidermal cyst: Secondary | ICD-10-CM | POA: Diagnosis not present

## 2016-12-10 DIAGNOSIS — E6609 Other obesity due to excess calories: Secondary | ICD-10-CM | POA: Diagnosis not present

## 2016-12-10 DIAGNOSIS — N189 Chronic kidney disease, unspecified: Secondary | ICD-10-CM | POA: Diagnosis not present

## 2016-12-11 ENCOUNTER — Telehealth: Payer: Self-pay

## 2016-12-11 ENCOUNTER — Other Ambulatory Visit (HOSPITAL_COMMUNITY)
Admission: RE | Admit: 2016-12-11 | Discharge: 2016-12-11 | Disposition: A | Discharge status: Home / Self Care | Payer: Medicare HMO | Source: Ambulatory Visit | Attending: Cardiovascular Disease | Admitting: Cardiovascular Disease

## 2016-12-11 DIAGNOSIS — I779 Disorder of arteries and arterioles, unspecified: Secondary | ICD-10-CM

## 2016-12-11 DIAGNOSIS — Z01818 Encounter for other preprocedural examination: Secondary | ICD-10-CM | POA: Insufficient documentation

## 2016-12-11 DIAGNOSIS — I739 Peripheral vascular disease, unspecified: Secondary | ICD-10-CM

## 2016-12-11 LAB — BASIC METABOLIC PANEL
Anion gap: 9 (ref 5–15)
BUN: 16 mg/dL (ref 6–20)
CHLORIDE: 103 mmol/L (ref 101–111)
CO2: 26 mmol/L (ref 22–32)
CREATININE: 0.75 mg/dL (ref 0.61–1.24)
Calcium: 9.3 mg/dL (ref 8.9–10.3)
GFR calc Af Amer: >60 mL/min (ref >60)
GFR calc non Af Amer: >60 mL/min (ref >60)
GLUCOSE: 126 mg/dL — AB (ref 65–99)
Potassium: 4.2 mmol/L (ref 3.5–5.1)
SODIUM: 138 mmol/L (ref 135–145)

## 2016-12-11 NOTE — Telephone Encounter (Signed)
I spoke with pt, he will have bmet done and then stop by office to schedule neck CT

## 2016-12-11 NOTE — Telephone Encounter (Signed)
-----   Message from Laurine Blazer, LPN sent at 04/06/9468  2:50 PM EDT -----   ----- Message ----- From: Herminio Commons, MD Sent: 12/05/2016   1:37 PM To: Laurine Blazer, LPN  Obtain copy of most recent BMET. Obtain CT angio of neck for a more detailed analysis of carotid artery disease.

## 2016-12-20 ENCOUNTER — Ambulatory Visit (HOSPITAL_COMMUNITY)
Admission: RE | Admit: 2016-12-20 | Discharge: 2016-12-20 | Disposition: A | Discharge status: Home / Self Care | Payer: Medicare HMO | Source: Ambulatory Visit | Attending: Cardiovascular Disease | Admitting: Cardiovascular Disease

## 2016-12-20 DIAGNOSIS — I739 Peripheral vascular disease, unspecified: Secondary | ICD-10-CM

## 2016-12-20 DIAGNOSIS — I779 Disorder of arteries and arterioles, unspecified: Secondary | ICD-10-CM | POA: Insufficient documentation

## 2016-12-20 DIAGNOSIS — Z01818 Encounter for other preprocedural examination: Secondary | ICD-10-CM | POA: Insufficient documentation

## 2016-12-20 DIAGNOSIS — M50322 Other cervical disc degeneration at C5-C6 level: Secondary | ICD-10-CM | POA: Insufficient documentation

## 2016-12-20 DIAGNOSIS — I6522 Occlusion and stenosis of left carotid artery: Secondary | ICD-10-CM | POA: Diagnosis not present

## 2016-12-20 DIAGNOSIS — M4802 Spinal stenosis, cervical region: Secondary | ICD-10-CM | POA: Insufficient documentation

## 2016-12-20 MED ORDER — IOPAMIDOL (ISOVUE-370) INJECTION 76%
80.0000 mL | Freq: Once | INTRAVENOUS | Status: AC | PRN
  Administered 2016-12-20: 80 mL via INTRAVENOUS

## 2017-01-30 DIAGNOSIS — Z7901 Long term (current) use of anticoagulants: Secondary | ICD-10-CM | POA: Diagnosis not present

## 2017-02-01 ENCOUNTER — Telehealth: Payer: Self-pay | Admitting: Vascular Surgery

## 2017-02-01 NOTE — Telephone Encounter (Signed)
Added a carotid duplex to the 08/20/16 at 12:00.

## 2017-02-01 NOTE — Telephone Encounter (Signed)
-----   Message from Viann Fish, NP sent at 02/01/2017  9:22 AM EDT ----- Regarding: add carotid duplex on return in May 2019 Dear schedulers, Please add carotid duplex to pt's other testing when he returns in May 2019.  Indication is left extracranial carotid artery stenosis.   Thank you, Vinnie Level

## 2017-02-25 ENCOUNTER — Telehealth: Payer: Self-pay | Admitting: Cardiovascular Disease

## 2017-02-25 NOTE — Telephone Encounter (Signed)
Pt wanted to know if he's supposed to have an apt w/ Dr. Donnetta Hutching since he had his testing done. He has not heard anything from [facility]

## 2017-02-25 NOTE — Telephone Encounter (Signed)
I spoke with [facility] and they state Ms Nickel NP added carotid US for his May 2019 apt, pt was satisfied.He did not want to speak with Dr Luther Parody nurse

## 2017-02-27 DIAGNOSIS — Z7901 Long term (current) use of anticoagulants: Secondary | ICD-10-CM | POA: Diagnosis not present

## 2017-03-04 DIAGNOSIS — Z7901 Long term (current) use of anticoagulants: Secondary | ICD-10-CM | POA: Diagnosis not present

## 2017-04-19 ENCOUNTER — Other Ambulatory Visit (HOSPITAL_COMMUNITY): Payer: Self-pay | Admitting: Family Medicine

## 2017-04-19 ENCOUNTER — Ambulatory Visit (HOSPITAL_COMMUNITY)
Admission: RE | Admit: 2017-04-19 | Discharge: 2017-04-19 | Disposition: A | Discharge status: Home / Self Care | Payer: Medicare HMO | Source: Ambulatory Visit | Attending: Family Medicine | Admitting: Family Medicine

## 2017-04-19 DIAGNOSIS — E6609 Other obesity due to excess calories: Secondary | ICD-10-CM | POA: Diagnosis not present

## 2017-04-19 DIAGNOSIS — N189 Chronic kidney disease, unspecified: Secondary | ICD-10-CM | POA: Diagnosis not present

## 2017-04-19 DIAGNOSIS — Z6833 Body mass index (BMI) 33.0-33.9, adult: Secondary | ICD-10-CM | POA: Diagnosis not present

## 2017-04-19 DIAGNOSIS — E782 Mixed hyperlipidemia: Secondary | ICD-10-CM | POA: Diagnosis not present

## 2017-04-19 DIAGNOSIS — Z1389 Encounter for screening for other disorder: Secondary | ICD-10-CM | POA: Diagnosis not present

## 2017-04-19 DIAGNOSIS — N401 Enlarged prostate with lower urinary tract symptoms: Secondary | ICD-10-CM | POA: Diagnosis not present

## 2017-04-19 DIAGNOSIS — E119 Type 2 diabetes mellitus without complications: Secondary | ICD-10-CM | POA: Diagnosis not present

## 2017-04-19 DIAGNOSIS — I10 Essential (primary) hypertension: Secondary | ICD-10-CM | POA: Diagnosis not present

## 2017-04-19 DIAGNOSIS — R600 Localized edema: Secondary | ICD-10-CM

## 2017-04-19 DIAGNOSIS — E1165 Type 2 diabetes mellitus with hyperglycemia: Secondary | ICD-10-CM | POA: Diagnosis not present

## 2017-04-29 DIAGNOSIS — Z7901 Long term (current) use of anticoagulants: Secondary | ICD-10-CM | POA: Diagnosis not present

## 2017-05-02 DIAGNOSIS — Z7901 Long term (current) use of anticoagulants: Secondary | ICD-10-CM | POA: Diagnosis not present

## 2017-05-08 DIAGNOSIS — Z7901 Long term (current) use of anticoagulants: Secondary | ICD-10-CM | POA: Diagnosis not present

## 2017-05-28 DIAGNOSIS — K229 Disease of esophagus, unspecified: Secondary | ICD-10-CM | POA: Diagnosis not present

## 2017-05-28 DIAGNOSIS — J3801 Paralysis of vocal cords and larynx, unilateral: Secondary | ICD-10-CM | POA: Diagnosis not present

## 2017-05-28 DIAGNOSIS — R1312 Dysphagia, oropharyngeal phase: Secondary | ICD-10-CM | POA: Diagnosis not present

## 2017-05-28 DIAGNOSIS — R1313 Dysphagia, pharyngeal phase: Secondary | ICD-10-CM | POA: Diagnosis not present

## 2017-05-28 DIAGNOSIS — R633 Feeding difficulties: Secondary | ICD-10-CM | POA: Diagnosis not present

## 2017-05-28 DIAGNOSIS — R1310 Dysphagia, unspecified: Secondary | ICD-10-CM | POA: Diagnosis not present

## 2017-05-31 DIAGNOSIS — Z7984 Long term (current) use of oral hypoglycemic drugs: Secondary | ICD-10-CM | POA: Diagnosis not present

## 2017-05-31 DIAGNOSIS — M1990 Unspecified osteoarthritis, unspecified site: Secondary | ICD-10-CM | POA: Diagnosis not present

## 2017-05-31 DIAGNOSIS — R1312 Dysphagia, oropharyngeal phase: Secondary | ICD-10-CM | POA: Diagnosis not present

## 2017-05-31 DIAGNOSIS — Z8546 Personal history of malignant neoplasm of prostate: Secondary | ICD-10-CM | POA: Diagnosis not present

## 2017-05-31 DIAGNOSIS — E119 Type 2 diabetes mellitus without complications: Secondary | ICD-10-CM | POA: Diagnosis not present

## 2017-05-31 DIAGNOSIS — H9190 Unspecified hearing loss, unspecified ear: Secondary | ICD-10-CM | POA: Diagnosis not present

## 2017-05-31 DIAGNOSIS — I10 Essential (primary) hypertension: Secondary | ICD-10-CM | POA: Diagnosis not present

## 2017-05-31 DIAGNOSIS — Z7901 Long term (current) use of anticoagulants: Secondary | ICD-10-CM | POA: Diagnosis not present

## 2017-05-31 DIAGNOSIS — J3800 Paralysis of vocal cords and larynx, unspecified: Secondary | ICD-10-CM | POA: Diagnosis not present

## 2017-06-03 DIAGNOSIS — I10 Essential (primary) hypertension: Secondary | ICD-10-CM | POA: Diagnosis not present

## 2017-06-03 DIAGNOSIS — H9190 Unspecified hearing loss, unspecified ear: Secondary | ICD-10-CM | POA: Diagnosis not present

## 2017-06-03 DIAGNOSIS — M1990 Unspecified osteoarthritis, unspecified site: Secondary | ICD-10-CM | POA: Diagnosis not present

## 2017-06-03 DIAGNOSIS — J3800 Paralysis of vocal cords and larynx, unspecified: Secondary | ICD-10-CM | POA: Diagnosis not present

## 2017-06-03 DIAGNOSIS — E119 Type 2 diabetes mellitus without complications: Secondary | ICD-10-CM | POA: Diagnosis not present

## 2017-06-03 DIAGNOSIS — Z7901 Long term (current) use of anticoagulants: Secondary | ICD-10-CM | POA: Diagnosis not present

## 2017-06-03 DIAGNOSIS — R1312 Dysphagia, oropharyngeal phase: Secondary | ICD-10-CM | POA: Diagnosis not present

## 2017-06-03 DIAGNOSIS — Z7984 Long term (current) use of oral hypoglycemic drugs: Secondary | ICD-10-CM | POA: Diagnosis not present

## 2017-06-03 DIAGNOSIS — Z8546 Personal history of malignant neoplasm of prostate: Secondary | ICD-10-CM | POA: Diagnosis not present

## 2017-06-06 DIAGNOSIS — I10 Essential (primary) hypertension: Secondary | ICD-10-CM | POA: Diagnosis not present

## 2017-06-06 DIAGNOSIS — E119 Type 2 diabetes mellitus without complications: Secondary | ICD-10-CM | POA: Diagnosis not present

## 2017-06-06 DIAGNOSIS — Z8546 Personal history of malignant neoplasm of prostate: Secondary | ICD-10-CM | POA: Diagnosis not present

## 2017-06-06 DIAGNOSIS — M1990 Unspecified osteoarthritis, unspecified site: Secondary | ICD-10-CM | POA: Diagnosis not present

## 2017-06-06 DIAGNOSIS — Z7901 Long term (current) use of anticoagulants: Secondary | ICD-10-CM | POA: Diagnosis not present

## 2017-06-06 DIAGNOSIS — R1312 Dysphagia, oropharyngeal phase: Secondary | ICD-10-CM | POA: Diagnosis not present

## 2017-06-06 DIAGNOSIS — Z7984 Long term (current) use of oral hypoglycemic drugs: Secondary | ICD-10-CM | POA: Diagnosis not present

## 2017-06-06 DIAGNOSIS — J3800 Paralysis of vocal cords and larynx, unspecified: Secondary | ICD-10-CM | POA: Diagnosis not present

## 2017-06-06 DIAGNOSIS — H9190 Unspecified hearing loss, unspecified ear: Secondary | ICD-10-CM | POA: Diagnosis not present

## 2017-06-10 DIAGNOSIS — Z7901 Long term (current) use of anticoagulants: Secondary | ICD-10-CM | POA: Diagnosis not present

## 2017-06-13 DIAGNOSIS — Z7901 Long term (current) use of anticoagulants: Secondary | ICD-10-CM | POA: Diagnosis not present

## 2017-06-13 DIAGNOSIS — Z7984 Long term (current) use of oral hypoglycemic drugs: Secondary | ICD-10-CM | POA: Diagnosis not present

## 2017-06-13 DIAGNOSIS — E119 Type 2 diabetes mellitus without complications: Secondary | ICD-10-CM | POA: Diagnosis not present

## 2017-06-13 DIAGNOSIS — R1312 Dysphagia, oropharyngeal phase: Secondary | ICD-10-CM | POA: Diagnosis not present

## 2017-06-13 DIAGNOSIS — J3800 Paralysis of vocal cords and larynx, unspecified: Secondary | ICD-10-CM | POA: Diagnosis not present

## 2017-06-13 DIAGNOSIS — I10 Essential (primary) hypertension: Secondary | ICD-10-CM | POA: Diagnosis not present

## 2017-06-13 DIAGNOSIS — M1990 Unspecified osteoarthritis, unspecified site: Secondary | ICD-10-CM | POA: Diagnosis not present

## 2017-06-13 DIAGNOSIS — Z8546 Personal history of malignant neoplasm of prostate: Secondary | ICD-10-CM | POA: Diagnosis not present

## 2017-06-13 DIAGNOSIS — H9190 Unspecified hearing loss, unspecified ear: Secondary | ICD-10-CM | POA: Diagnosis not present

## 2017-06-14 DIAGNOSIS — Z7901 Long term (current) use of anticoagulants: Secondary | ICD-10-CM | POA: Diagnosis not present

## 2017-06-14 DIAGNOSIS — E119 Type 2 diabetes mellitus without complications: Secondary | ICD-10-CM | POA: Diagnosis not present

## 2017-06-14 DIAGNOSIS — Z8546 Personal history of malignant neoplasm of prostate: Secondary | ICD-10-CM | POA: Diagnosis not present

## 2017-06-14 DIAGNOSIS — R1312 Dysphagia, oropharyngeal phase: Secondary | ICD-10-CM | POA: Diagnosis not present

## 2017-06-14 DIAGNOSIS — I10 Essential (primary) hypertension: Secondary | ICD-10-CM | POA: Diagnosis not present

## 2017-06-14 DIAGNOSIS — Z7984 Long term (current) use of oral hypoglycemic drugs: Secondary | ICD-10-CM | POA: Diagnosis not present

## 2017-06-14 DIAGNOSIS — M1990 Unspecified osteoarthritis, unspecified site: Secondary | ICD-10-CM | POA: Diagnosis not present

## 2017-06-14 DIAGNOSIS — J3800 Paralysis of vocal cords and larynx, unspecified: Secondary | ICD-10-CM | POA: Diagnosis not present

## 2017-06-14 DIAGNOSIS — H9190 Unspecified hearing loss, unspecified ear: Secondary | ICD-10-CM | POA: Diagnosis not present

## 2017-06-24 DIAGNOSIS — Z7901 Long term (current) use of anticoagulants: Secondary | ICD-10-CM | POA: Diagnosis not present

## 2017-07-17 ENCOUNTER — Other Ambulatory Visit: Payer: Self-pay

## 2017-07-17 DIAGNOSIS — I6529 Occlusion and stenosis of unspecified carotid artery: Secondary | ICD-10-CM

## 2017-07-30 DIAGNOSIS — Z0001 Encounter for general adult medical examination with abnormal findings: Secondary | ICD-10-CM | POA: Diagnosis not present

## 2017-07-30 DIAGNOSIS — E119 Type 2 diabetes mellitus without complications: Secondary | ICD-10-CM | POA: Diagnosis not present

## 2017-07-30 DIAGNOSIS — Z6834 Body mass index (BMI) 34.0-34.9, adult: Secondary | ICD-10-CM | POA: Diagnosis not present

## 2017-07-30 DIAGNOSIS — Z1389 Encounter for screening for other disorder: Secondary | ICD-10-CM | POA: Diagnosis not present

## 2017-07-30 DIAGNOSIS — N189 Chronic kidney disease, unspecified: Secondary | ICD-10-CM | POA: Diagnosis not present

## 2017-07-30 DIAGNOSIS — Z7901 Long term (current) use of anticoagulants: Secondary | ICD-10-CM | POA: Diagnosis not present

## 2017-07-30 DIAGNOSIS — E782 Mixed hyperlipidemia: Secondary | ICD-10-CM | POA: Diagnosis not present

## 2017-07-30 DIAGNOSIS — I4891 Unspecified atrial fibrillation: Secondary | ICD-10-CM | POA: Diagnosis not present

## 2017-07-30 DIAGNOSIS — I10 Essential (primary) hypertension: Secondary | ICD-10-CM | POA: Diagnosis not present

## 2017-08-20 ENCOUNTER — Ambulatory Visit (INDEPENDENT_AMBULATORY_CARE_PROVIDER_SITE_OTHER)
Admission: RE | Admit: 2017-08-20 | Discharge: 2017-08-20 | Disposition: A | Discharge status: Home / Self Care | Payer: Medicare HMO | Source: Ambulatory Visit | Attending: Family Medicine | Admitting: Family Medicine

## 2017-08-20 ENCOUNTER — Encounter: Payer: Self-pay | Admitting: Family

## 2017-08-20 ENCOUNTER — Ambulatory Visit: Payer: Medicare HMO | Admitting: Family

## 2017-08-20 ENCOUNTER — Ambulatory Visit (HOSPITAL_COMMUNITY)
Admission: RE | Admit: 2017-08-20 | Discharge: 2017-08-20 | Disposition: A | Discharge status: Home / Self Care | Payer: Medicare HMO | Source: Ambulatory Visit | Attending: Family Medicine | Admitting: Family Medicine

## 2017-08-20 VITALS — BP 128/68 | HR 50 | Temp 96.6°F | Resp 16 | Ht 70.0 in | Wt 222.0 lb

## 2017-08-20 DIAGNOSIS — Z79899 Other long term (current) drug therapy: Secondary | ICD-10-CM | POA: Insufficient documentation

## 2017-08-20 DIAGNOSIS — Z87891 Personal history of nicotine dependence: Secondary | ICD-10-CM | POA: Insufficient documentation

## 2017-08-20 DIAGNOSIS — Z95828 Presence of other vascular implants and grafts: Secondary | ICD-10-CM | POA: Insufficient documentation

## 2017-08-20 DIAGNOSIS — I6523 Occlusion and stenosis of bilateral carotid arteries: Secondary | ICD-10-CM

## 2017-08-20 DIAGNOSIS — E669 Obesity, unspecified: Secondary | ICD-10-CM | POA: Insufficient documentation

## 2017-08-20 DIAGNOSIS — I714 Abdominal aortic aneurysm, without rupture, unspecified: Secondary | ICD-10-CM

## 2017-08-20 DIAGNOSIS — Z8546 Personal history of malignant neoplasm of prostate: Secondary | ICD-10-CM | POA: Diagnosis not present

## 2017-08-20 DIAGNOSIS — E785 Hyperlipidemia, unspecified: Secondary | ICD-10-CM | POA: Insufficient documentation

## 2017-08-20 DIAGNOSIS — I872 Venous insufficiency (chronic) (peripheral): Secondary | ICD-10-CM | POA: Diagnosis not present

## 2017-08-20 DIAGNOSIS — Z951 Presence of aortocoronary bypass graft: Secondary | ICD-10-CM | POA: Diagnosis not present

## 2017-08-20 DIAGNOSIS — I6529 Occlusion and stenosis of unspecified carotid artery: Secondary | ICD-10-CM

## 2017-08-20 DIAGNOSIS — Z7982 Long term (current) use of aspirin: Secondary | ICD-10-CM | POA: Diagnosis not present

## 2017-08-20 DIAGNOSIS — E1151 Type 2 diabetes mellitus with diabetic peripheral angiopathy without gangrene: Secondary | ICD-10-CM

## 2017-08-20 DIAGNOSIS — I10 Essential (primary) hypertension: Secondary | ICD-10-CM | POA: Insufficient documentation

## 2017-08-20 DIAGNOSIS — I739 Peripheral vascular disease, unspecified: Secondary | ICD-10-CM

## 2017-08-20 DIAGNOSIS — Z6831 Body mass index (BMI) 31.0-31.9, adult: Secondary | ICD-10-CM | POA: Insufficient documentation

## 2017-08-20 DIAGNOSIS — I2510 Atherosclerotic heart disease of native coronary artery without angina pectoris: Secondary | ICD-10-CM | POA: Diagnosis not present

## 2017-08-20 DIAGNOSIS — K219 Gastro-esophageal reflux disease without esophagitis: Secondary | ICD-10-CM | POA: Diagnosis not present

## 2017-08-20 DIAGNOSIS — Z7984 Long term (current) use of oral hypoglycemic drugs: Secondary | ICD-10-CM | POA: Diagnosis not present

## 2017-08-20 DIAGNOSIS — I252 Old myocardial infarction: Secondary | ICD-10-CM | POA: Insufficient documentation

## 2017-08-20 DIAGNOSIS — Z923 Personal history of irradiation: Secondary | ICD-10-CM | POA: Diagnosis not present

## 2017-08-20 DIAGNOSIS — Z7901 Long term (current) use of anticoagulants: Secondary | ICD-10-CM | POA: Insufficient documentation

## 2017-08-20 NOTE — Patient Instructions (Addendum)
Before your next abdominal ultrasound:  Take two Extra-Strength Gas-X capsules at bedtime the night before the test. Take another two Extra-Strength Gas-X capsules 3 hours before the test.  Avoid gas forming foods and beverages the day before the test.        To measure for knee high compression hose: Measure the length of calf (from the crease of the knee to the bottom of the heel), largest circumference of calf, and ankle circumference first thing in the morning before your legs have a chance to swell.  Take these 3 measurements with you to obtain 20-30 mm mercury graduated knee high compression hose.  Put the stockings on in the morning, remove at bedtime.    To decrease swelling in your feet and legs: Elevate feet above slightly bent knees, feet above heart, overnight and 3-4 times per day for 20 minutes.     Abdominal Aortic Aneurysm Blood pumps away from the heart through tubes (blood vessels) called arteries. Aneurysms are weak or damaged places in the wall of an artery. It bulges out like a balloon. An abdominal aortic aneurysm happens in the main artery of the body (aorta). It can burst or tear, causing bleeding inside the body. This is an emergency. It needs treatment right away. What are the causes? The exact cause is unknown. Things that could cause this problem include:  Fat and other substances building up in the lining of a tube.  Swelling of the walls of a blood vessel.  Certain tissue diseases.  Belly (abdominal) trauma.  An infection in the main artery of the body.  What increases the risk? There are things that make it more likely for you to have an aneurysm. These include:  Being over the age of 75 years old.  Having high blood pressure (hypertension).  Being a male.  Being white.  Being very overweight (obese).  Having a family history of aneurysm.  Using tobacco products.  What are the signs or symptoms? Symptoms depend on the size of the  aneurysm and how fast it grows. There may not be symptoms. If symptoms occur, they can include:  Pain (belly, side, lower back, or groin).  Feeling full after eating a small amount of food.  Feeling sick to your stomach (nauseous), throwing up (vomiting), or both.  Feeling a lump in your belly that feels like it is beating (pulsating).  Feeling like you will pass out (faint).  How is this treated?  Medicine to control blood pressure and pain.  Imaging tests to see if the aneurysm gets bigger.  Surgery. How is this prevented? To lessen your chance of getting this condition:  Stop smoking. Stop chewing tobacco.  Limit or avoid alcohol.  Keep your blood pressure, blood sugar, and cholesterol within normal limits.  Eat less salt.  Eat foods low in saturated fats and cholesterol. These are found in animal and whole dairy products.  Eat more fiber. Fiber is found in whole grains, vegetables, and fruits.  Keep a healthy weight.  Stay active and exercise often.  This information is not intended to replace advice given to you by your health care provider. Make sure you discuss any questions you have with your health care provider. Document Released: 07/14/2012 Document Revised: 08/25/2015 Document Reviewed: 04/18/2012 Elsevier Interactive Patient Education  2017 [employer].     Stroke Prevention Some health problems and behaviors may make it more likely for you to have a stroke. Below are ways to lessen your risk of  having a stroke.  Be active for at least 30 minutes on most or all days.  Do not smoke. Try not to be around others who smoke.  Do not drink too much alcohol. ? Do not have more than 2 drinks a day if you are a man. ? Do not have more than 1 drink a day if you are a woman and are not pregnant.  Eat healthy foods, such as fruits and vegetables. If you were put on a specific diet, follow the diet as told.  Keep your cholesterol levels under control  through diet and medicines. Look for foods that are low in saturated fat, trans fat, cholesterol, and are high in fiber.  If you have diabetes, follow all diet plans and take your medicine as told.  Ask your doctor if you need treatment to lower your blood pressure. If you have high blood pressure (hypertension), follow all diet plans and take your medicine as told by your doctor.  If you are 28-33 years old, have your blood pressure checked every 3-5 years. If you are age 38 or older, have your blood pressure checked every year.  Keep a healthy weight. Eat foods that are low in calories, salt, saturated fat, trans fat, and cholesterol.  Do not take drugs.  Avoid birth control pills, if this applies. Talk to your doctor about the risks of taking birth control pills.  Talk to your doctor if you have sleep problems (sleep apnea).  Take all medicine as told by your doctor. ? You may be told to take aspirin or blood thinner medicine. Take this medicine as told by your doctor. ? Understand your medicine instructions.  Make sure any other conditions you have are being taken care of.  Get help right away if:  You suddenly lose feeling (you feel numb) or have weakness in your face, arm, or leg.  Your face or eyelid hangs down to one side.  You suddenly feel confused.  You have trouble talking (aphasia) or understanding what people are saying.  You suddenly have trouble seeing in one or both eyes.  You suddenly have trouble walking.  You are dizzy.  You lose your balance or your movements are clumsy (uncoordinated).  You suddenly have a very bad headache and you do not know the cause.  You have new chest pain.  Your heart feels like it is fluttering or skipping a beat (irregular heartbeat). Do not wait to see if the symptoms above go away. Get help right away. Call your local emergency services (911 in [place] Do not drive yourself to the hospital. This information is not intended  to replace advice given to you by your health care provider. Make sure you discuss any questions you have with your health care provider. Document Released: 09/18/2011 Document Revised: 08/25/2015 Document Reviewed: 09/19/2012 Elsevier Interactive Patient Education  2018 [employer].     Peripheral Vascular Disease Peripheral vascular disease (PVD) is a disease of the blood vessels that are not part of your heart and brain. A simple term for PVD is poor circulation. In most cases, PVD narrows the blood vessels that carry blood from your heart to the rest of your body. This can result in a decreased supply of blood to your arms, legs, and internal organs, like your stomach or kidneys. However, it most often affects a person's lower legs and feet. There are two types of PVD.  Organic PVD. This is the more common type. It is caused by  damage to the structure of blood vessels.  Functional PVD. This is caused by conditions that make blood vessels contract and tighten (spasm).  Without treatment, PVD tends to get worse over time. PVD can also lead to acute ischemic limb. This is when an arm or limb suddenly has trouble getting enough blood. This is a medical emergency. Follow these instructions at home:  Take medicines only as told by your doctor.  Do not use any tobacco products, including cigarettes, chewing tobacco, or electronic cigarettes. If you need help quitting, ask your doctor.  Lose weight if you are overweight, and maintain a healthy weight as told by your doctor.  Eat a diet that is low in fat and cholesterol. If you need help, ask your doctor.  Exercise regularly. Ask your doctor for some good activities for you.  Take good care of your feet. ? Wear comfortable shoes that fit well. ? Check your feet often for any cuts or sores. Contact a doctor if:  You have cramps in your legs while walking.  You have leg pain when you are at rest.  You have coldness in a leg or  foot.  Your skin changes.  You are unable to get or have an erection (erectile dysfunction).  You have cuts or sores on your feet that are not healing. Get help right away if:  Your arm or leg turns cold and blue.  Your arms or legs become red, warm, swollen, painful, or numb.  You have chest pain or trouble breathing.  You suddenly have weakness in your face, arm, or leg.  You become very confused or you cannot speak.  You suddenly have a very bad headache.  You suddenly cannot see. This information is not intended to replace advice given to you by your health care provider. Make sure you discuss any questions you have with your health care provider. Document Released: 06/13/2009 Document Revised: 08/25/2015 Document Reviewed: 08/27/2013 Elsevier Interactive Patient Education  2017 [employer].

## 2017-08-20 NOTE — Progress Notes (Signed)
[facility]  HISTORY AND PHYSICAL   CC: Follow up extracranial carotid artery disease, AAA, and peripheral artery occlusive disease     History of Present Illness:   Troy [name] is a 77 y.o. male whom Dr. Donnetta Hutching has been monitoring for diffuse peripheral vascular occlusive disease. He does have a known moderate sized infrarenal abdominal aortic aneurysm. He is also status post bilateral extremity bypasses about 2000 and is here for followup of these as well.  He has had no recent worsening of his heart disease. He has no claudication symptoms and no symptoms referable to his aneurysm. He has tried knee high compression hose for lower legs swelling during the day and he states they make his legs hurt and swell more, he has no swelling in his legs in the morning. 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 He denies any history of stroke or TIA.  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 He had also been having aspiration pneumonia until his esophagus was dilated.    Pt Diabetic: Yes, states his A1C was 6.?, in good control Pt smoker: former smoker, quit in 1999  Pt meds include: Statin :Yes Betablocker: Yes ASA: Yes Other anticoagulants/antiplatelets: on coumadin due to recurrent right leg arterial stenoses per pt     Current Outpatient Medications  Medication Sig Dispense Refill  . amLODipine (NORVASC) 10 MG tablet Take 10 mg by mouth daily.      [name] aspirin EC 81 MG EC tablet Take 1 tablet (81 mg total) by mouth daily. 30 tablet 3  . atorvastatin (LIPITOR) 40 MG tablet Take 1 tablet (40 mg total) by mouth daily. 90 tablet 1  . HYDROcodone-acetaminophen (VICODIN) 5-500 MG per tablet Take 1 tablet by mouth every 6 (six) hours  as needed for pain.     . Isopropyl Alcohol (ALCOHOL PREP PADS EX)     . isosorbide mononitrate (IMDUR) 30 MG 24 hr tablet Take 1 tablet (30 mg total) by mouth daily. 90 tablet 1  . losartan (COZAAR) 100 MG tablet Take 100 mg by mouth daily.      . metFORMIN (GLUCOPHAGE) 1000 MG tablet Take 1 tablet (1,000 mg total) by mouth 2 (two) times daily with a meal.    . metoprolol tartrate (LOPRESSOR) 25 MG tablet Take 0.5 tablets (12.5 mg total) by mouth 2 (two) times daily. 90 tablet 1  . nitroGLYCERIN (NITROSTAT) 0.4 MG SL tablet Place 1 tablet (0.4 mg total) under the tongue every 5 (five) minutes x 3 doses as needed for chest pain. 25 tablet 3  . pantoprazole (PROTONIX) 40 MG tablet TAKE 1 TABLET EVERY DAY (Patient taking differently: TAKE 1 TABLET  EVERY DAY) 90 tablet 3  . tetrahydrozoline-zinc (VISINE-AC) 0.05-0.25 % ophthalmic solution 2 drops 3 (three) times daily as needed.    . warfarin (COUMADIN) 2 MG tablet Take 2 mg by mouth daily.    . fexofenadine (ALLEGRA) 180 MG tablet Take 180 mg by mouth daily.    . furosemide (LASIX) 40 MG tablet Take 40 mg by mouth daily.      . tamsulosin (FLOMAX) 0.4 MG CAPS capsule      No current facility-administered medications for this visit.  Past Medical History:  Diagnosis Date  . AAA (abdominal aortic aneurysm) ([place])    Followed by Dr. Curt Jews  . Arthritis   . CAD (coronary artery disease)   . Cancer [facility])    Prostate:  Radiation Tx  . Carotid artery disease ([place])    Doppler 08/10/2010,  no change, RICA 2-58%, LICA 52-77%  . Chest pain    precordial. mild chronic .[name]... nonischemic  . Coronary artery disease    a. Nuclear, January, 2008, no ischemia b. Cath 08/2012- 1/4 patent grafts, RCA CTO, no flow-limiting disease, medically managed  . Diabetes mellitus without complication ([place])   . Dizziness 02/2011  . Dyslipidemia   . Edema   . Ejection fraction    Normal LV, nuclear, 2008, ( no echo data as July 31, 2010)  . GERD  (gastroesophageal reflux disease)    TAKES TUMS & ROLAIDS AS NEEDED  . Hx of CABG 2000  . Hypertension   . Myocardial infarction ([place]) 1999  . Neck pain 02/2011  . Pneumonia   . Renal artery stenosis ([facility])    50-70%  . S/P femoropopliteal bypass surgery    Dr. Donnetta Hutching  . Sinus bradycardia    Asymptomatic    Social History Social History   Tobacco Use  . Smoking status: Former Smoker    Last attempt to quit: 04/02/1998    Years since quitting: 19.3  . Smokeless tobacco: Never Used  Substance Use Topics  . Alcohol use: Yes    Alcohol/week: 0.0 - 1.2 oz    Comment: OCCASIONAL  . Drug use: No    Family History Family History  Problem Relation Age of Onset  . Deep vein thrombosis Father   . Lung cancer Sister   . Diabetes Sister   . Heart disease Sister        After age 54  . Hyperlipidemia Sister   . Hypertension Sister   . Lung cancer Sister   . Breast cancer Sister   . Hypertension Mother   . Diabetes Sister   . Coronary artery disease Unknown        family hx of  . Cancer Brother        "crab cancer"  . Heart disease Brother   . Heart attack Brother   . Heart attack Daughter   . Colon cancer Neg Hx   . Stroke Neg Hx     Surgical History Past Surgical History:  Procedure Laterality Date  . CARDIAC CATHETERIZATION  09/26/2012   1/4 patent bypass (occluded SVG-PDA, SVG-OM, LIMA-LAD), SVG-diagonal patent and fills the diagonal and LAD, distal RCA occlusion with left to right collateralization, patent circumflex, LAD with no flow-limiting disease and antegrade flow competitively from SVG-diagonal; EF 60-65%  . COLONOSCOPY  11/30/2009   [id number]. next TCS 11/2019  . CORONARY ARTERY BYPASS GRAFT  2000  . ESOPHAGOGASTRODUODENOSCOPY N/A 08/12/2013   QQP:[id number] peptic stricture with erosive refluxesophagitis - status post Maloney dilation. Hiatal hernia. Abnormalgastric mucosa. Deformity of the pyloric channel suggestive ofprior peptic ulcer disease.  Duodenal bulbar diverticulum Statuspost gastric biopsy. h.pylori  . LEFT HEART CATHETERIZATION WITH CORONARY/GRAFT ANGIOGRAM N/A 09/26/2012   Procedure: LEFT HEART CATHETERIZATION WITH Beatrix Fetters;  Surgeon: Burnell Blanks, MD;  Location[facility] CATH LAB;  Service: Cardiovascular;  Laterality: N/A;  . Venia Minks DILATION N/A 08/12/2013   Procedure: Venia Minks DILATION;  Surgeon: Daneil Dolin, MD;  Location: [facility] ENDO SUITE;  Service: Endoscopy;  Laterality: N/A;  . PR VEIN BYPASS GRAFT,AORTO-FEM-POP Right  07/19/1999  . PR VEIN BYPASS GRAFT,AORTO-FEM-POP Left 05/02/2006  . SAVORY DILATION N/A 08/12/2013   Procedure: SAVORY DILATION;  Surgeon: Daneil Dolin, MD;  Location: [facility] ENDO SUITE;  Service: Endoscopy;  Laterality: N/A;  . WRIST SURGERY     cyst removal    Allergies  Allergen Reactions  . Niacin Itching and Rash    Burning sensation  . Iodine-131 Nausea And Vomiting  . Nsaids Other (See Comments)    Taking Coumadin    Current Outpatient Medications  Medication Sig Dispense Refill  . amLODipine (NORVASC) 10 MG tablet Take 10 mg by mouth daily.      [name] aspirin EC 81 MG EC tablet Take 1 tablet (81 mg total) by mouth daily. 30 tablet 3  . atorvastatin (LIPITOR) 40 MG tablet Take 1 tablet (40 mg total) by mouth daily. 90 tablet 1  . HYDROcodone-acetaminophen (VICODIN) 5-500 MG per tablet Take 1 tablet by mouth every 6 (six) hours as needed for pain.     . Isopropyl Alcohol (ALCOHOL PREP PADS EX)     . isosorbide mononitrate (IMDUR) 30 MG 24 hr tablet Take 1 tablet (30 mg total) by mouth daily. 90 tablet 1  . losartan (COZAAR) 100 MG tablet Take 100 mg by mouth daily.      . metFORMIN (GLUCOPHAGE) 1000 MG tablet Take 1 tablet (1,000 mg total) by mouth 2 (two) times daily with a meal.    . metoprolol tartrate (LOPRESSOR) 25 MG tablet Take 0.5 tablets (12.5 mg total) by mouth 2 (two) times daily. 90 tablet 1  . nitroGLYCERIN (NITROSTAT) 0.4 MG SL tablet Place 1 tablet (0.4 mg  total) under the tongue every 5 (five) minutes x 3 doses as needed for chest pain. 25 tablet 3  . pantoprazole (PROTONIX) 40 MG tablet TAKE 1 TABLET EVERY DAY (Patient taking differently: TAKE 1 TABLET  EVERY DAY) 90 tablet 3  . tetrahydrozoline-zinc (VISINE-AC) 0.05-0.25 % ophthalmic solution 2 drops 3 (three) times daily as needed.    . warfarin (COUMADIN) 2 MG tablet Take 2 mg by mouth daily.    . fexofenadine (ALLEGRA) 180 MG tablet Take 180 mg by mouth daily.    . furosemide (LASIX) 40 MG tablet Take 40 mg by mouth daily.      . tamsulosin (FLOMAX) 0.4 MG CAPS capsule      No current facility-administered medications for this visit.      REVIEW OF SYSTEMS: See HPI for pertinent positives and negatives.  Physical Examination Vitals:   08/20/17 1107  BP: 128/68  Pulse: (!) 50  Resp: 16  Temp: (!) 96.6 F (35.9 C)  TempSrc: Oral  SpO2: 94%  Weight: 222 lb (100.7 kg)  Height: 5\' 10"  (1.778 m)   Body mass index is 31.85 kg/m.  General: WDWN in obese male in NAD Gait: slow, steady, using cane HENT: no gross abnormalities  Eyes: PERRLA Pulmonary: normal non-labored breathing, diminished air movement in all fields, no rales, rhonchi, or wheezing. Cardiac: Regular rhythm, bradycardic (on a beta blocker), no murmur detected Abdomen: softly obese, NT, no masses palpated Skin: no rashes, no ulcers, no cellulitis   VASCULAR EXAM  Carotid Bruits Right Left   Negative Negative    Abdominal aortic pulse is not palpable Radial pulses are 2+ palpable and =   VASCULAR EXAM: Extremitieswithout ischemic changes, without Gangrene; without open wounds.     LE Pulses Right Left   FEMORAL faintly palpable 2+ palpable    POPLITEAL  palpable  palpable   POSTERIOR TIBIAL not  palpable  2+ palpable    DORSALIS PEDIS  ANTERIOR TIBIAL 1+ palpable  1+ palpable     Musculoskeletal: no muscle wasting or atrophy; 1+ pitting and non pitting edema in right lower leg, 2+ in left lower leg. Neurologic:  A&O X 3; appropriate affect, sensation is normal; speech is normal, CN 2-12 intact, pain and light touch intact in extremities, motor exam as listed above. Psychiatric: Normal thought content, mood appropriate to clinical situation.     ASSESSMENT:  Troy [name] is a 77 y.o. male who is being followed for a moderate sized infrarenal abdominal aortic aneurysm and who is also s/p right femoral to above knee popliteal bypass graft with Gortex on 07/19/1999, and left femoral to below knee popliteal artery bypass graft on 04/12/2006.  He has a known occlusion of the right fem-pop bypass graft, but has adequate collateral perfusion. He has no symptoms referable to his aneurysm.  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   Chronic venous insufficiency: He has tried knee high compression hose for lower legs swelling during the day and he states they make his legs hurt and swell more, he has no swelling in his legs in the morning. Today he has minimal edema in his lower legs.   His atherosclerotic risk factors include stable and well controlled DM, former smoker, CAD, dyslipidemia, and obesity. His hypertension is well controlled.    DATA  Carotid Duplex (08/20/17): Right ICA: 1-39% stenosis Left ICA: 40-59% stenosis Right ECA: >50% stenosis Bilateral vertebral artery flow is antegrade.  Bilateral subclavian artery waveforms are normal.  Less bilateral ICA stenosis compared to the exam at [facility] on 12-05-16.   AAA Duplex (08/20/17): Tandem AAA with the largest diameter in the distal aorta measuring 4.3 cm; Right  CIA: 1.8 cm; Left CIA: 1.5 cm Limitations: air bowel gas Previous (08-14-16): AAA: 4.4 cm; right CIA: 1.6 cm; Left CIA: 1.4 cm No change compared to the exam on 08-14-16, based on limited visualization.    Left LE Arterial Duplex (08/20/17): No stenosis of the left fem-pop bypass graft; tri and biphasic waveforms. No inflow disease noted today, as was on 08-14-16.   ABI (Date: 08/20/2017):  R:   ABI: [place] (was [place] on 08-14-16),   PT: mono  DP: mono  TBI:  0.48 (was 0.53)  L:   ABI: [place] (was 1.04),   PT: tri  DP: bi  TBI: 0.90 (was 0.78)  Stable bilateral ABI and TBI. Non compressible vessels in both ankles. Monophasic waveforms in the right, tri and biphasic in the left.    Left LE DVT duplex at [facility] on 04-19-17: No DVT    PLAN:   Continue graduated walking program. Based on today's exam and non-invasive vascular lab results, the patient will follow up in 1 year with the following tests: left LE arterial Duplex, ABI's, carotid duplex, and AAA Duplex.  I advised him to notify us if he develops concerns re the circulation in his feet or legs.   I discussed in depth with the patient the nature of atherosclerosis, and emphasized the importance of maximal medical management including strict control of blood pressure, blood glucose, and lipid levels, obtaining regular exercise, and cessation of smoking.  The patient is aware that without maximal medical management the underlying atherosclerotic disease process  will progress, limiting the benefit of any interventions.  Consideration for repair of AAA would be made when the size approaches 5.0 cm, growth > 1 cm/yr, and symptomatic status. The patient was given information about AAA including signs, symptoms, treatment,  what symptoms should prompt the patient to seek immediate medical care, and how to minimize the risk of enlargement and rupture of aneurysms.   The patient was given information about stroke prevention and what  symptoms should prompt the patient to seek immediate medical care.  The patient was given information about PAD including signs, symptoms, treatment, what symptoms should prompt the patient to seek immediate medical care, and risk reduction measures to take.  Thank you for allowing [place] to participate in this patient's care.  Clemon Chambers, RN, MSN, FNP-C [facility] Office: [phone]  Clinic MD: Early 08/20/2017 9:15 PM

## 2017-08-29 DIAGNOSIS — Z7901 Long term (current) use of anticoagulants: Secondary | ICD-10-CM | POA: Diagnosis not present

## 2017-09-16 ENCOUNTER — Other Ambulatory Visit: Payer: Self-pay

## 2017-09-16 DIAGNOSIS — Z7901 Long term (current) use of anticoagulants: Secondary | ICD-10-CM | POA: Diagnosis not present

## 2017-09-17 DIAGNOSIS — J3801 Paralysis of vocal cords and larynx, unilateral: Secondary | ICD-10-CM | POA: Diagnosis not present

## 2017-09-17 DIAGNOSIS — K229 Disease of esophagus, unspecified: Secondary | ICD-10-CM | POA: Diagnosis not present

## 2017-09-17 DIAGNOSIS — R633 Feeding difficulties: Secondary | ICD-10-CM | POA: Diagnosis not present

## 2017-09-17 DIAGNOSIS — R1313 Dysphagia, pharyngeal phase: Secondary | ICD-10-CM | POA: Diagnosis not present

## 2017-09-17 DIAGNOSIS — R1310 Dysphagia, unspecified: Secondary | ICD-10-CM | POA: Diagnosis not present

## 2017-10-22 DIAGNOSIS — Z7901 Long term (current) use of anticoagulants: Secondary | ICD-10-CM | POA: Diagnosis not present

## 2017-10-30 ENCOUNTER — Ambulatory Visit: Payer: Medicare HMO | Admitting: Cardiovascular Disease

## 2017-10-30 ENCOUNTER — Encounter: Payer: Self-pay | Admitting: *Deleted

## 2017-10-30 VITALS — BP 132/60 | HR 57 | Ht 70.0 in | Wt 228.0 lb

## 2017-10-30 DIAGNOSIS — I10 Essential (primary) hypertension: Secondary | ICD-10-CM | POA: Diagnosis not present

## 2017-10-30 DIAGNOSIS — I739 Peripheral vascular disease, unspecified: Secondary | ICD-10-CM

## 2017-10-30 DIAGNOSIS — I4821 Permanent atrial fibrillation: Secondary | ICD-10-CM

## 2017-10-30 DIAGNOSIS — I25708 Atherosclerosis of coronary artery bypass graft(s), unspecified, with other forms of angina pectoris: Secondary | ICD-10-CM

## 2017-10-30 DIAGNOSIS — I482 Chronic atrial fibrillation: Secondary | ICD-10-CM | POA: Diagnosis not present

## 2017-10-30 DIAGNOSIS — R001 Bradycardia, unspecified: Secondary | ICD-10-CM

## 2017-10-30 DIAGNOSIS — R600 Localized edema: Secondary | ICD-10-CM | POA: Diagnosis not present

## 2017-10-30 DIAGNOSIS — Z95828 Presence of other vascular implants and grafts: Secondary | ICD-10-CM | POA: Diagnosis not present

## 2017-10-30 MED ORDER — NITROGLYCERIN 0.4 MG SL SUBL: 0.4000 mg | SUBLINGUAL_TABLET | SUBLINGUAL | 3 refills | Status: DC | PRN

## 2017-10-30 NOTE — Patient Instructions (Addendum)
Your physician wants you to follow-up in: 1 year with Dr.Koneswaran You will receive a reminder letter in the mail two months in advance. If you don't receive a letter, please call our office to schedule the follow-up appointment.    STOP Metoprolol    I refilled your nitroglycerine    All other medications stay the same.      No labs , tests today      Thank you for choosing [facility] !

## 2017-10-30 NOTE — Progress Notes (Signed)
SUBJECTIVE: The patient presents for routine follow-up. His wife, Mardene Celeste, is also my patient. He has a history of coronary artery disease and bypass graft surgery. Coronary angiography on 09/26/2012 demonstrated one out of 4 patent bypass grafts. He also has a history of atrial fibrillation , hypertension, dyslipidemia, carotid artery disease, and peripheral vascular disease.  Echocardiogram 06/21/15: Normal left ventricular systolic function and regional wall motion, LVEF 55-60%, mild LVH, mild mitral regurgitation, severe left atrial dilatation, mild to moderate right ventricular and mild right atrial enlargement, moderately elevated pulmonary pressures, 59 mmHg.  He underwent left femoral-popliteal bypass surgery on 04/12/06 and right femoral-popliteal bypass surgery on 07/19/99. The right-sided graft is known to be occluded.  He also has an infrarenal abdominal aortic aneurysm.  He has chronic venous insufficiency with bilateral leg swelling.  ECG performed in the office today which I ordered and personally interpreted demonstrates atrial fibrillation with a right bundle branch block, 47 bpm.  He denies chest pain, shortness of breath, dizziness, and palpitations.    Review of Systems: As per "subjective", otherwise negative.  Allergies  Allergen Reactions  . Niacin Itching and Rash    Burning sensation  . Iodine-131 Nausea And Vomiting  . Nsaids Other (See Comments)    Taking Coumadin    Current Outpatient Medications  Medication Sig Dispense Refill  . amLODipine (NORVASC) 10 MG tablet Take 10 mg by mouth daily.      [name] aspirin EC 81 MG EC tablet Take 1 tablet (81 mg total) by mouth daily. 30 tablet 3  . atorvastatin (LIPITOR) 40 MG tablet Take 1 tablet (40 mg total) by mouth daily. 90 tablet 1  . furosemide (LASIX) 40 MG tablet Take 40 mg by mouth daily.      [name] HYDROcodone-acetaminophen (VICODIN) 5-500 MG per tablet Take 1 tablet by mouth every 6 (six) hours as  needed for pain.     . Isopropyl Alcohol (ALCOHOL PREP PADS EX)     . isosorbide mononitrate (IMDUR) 30 MG 24 hr tablet Take 1 tablet (30 mg total) by mouth daily. 90 tablet 1  . losartan (COZAAR) 100 MG tablet Take 100 mg by mouth daily.      . metFORMIN (GLUCOPHAGE) 1000 MG tablet Take 1 tablet (1,000 mg total) by mouth 2 (two) times daily with a meal.    . metoprolol tartrate (LOPRESSOR) 25 MG tablet Take 0.5 tablets (12.5 mg total) by mouth 2 (two) times daily. 90 tablet 1  . nitroGLYCERIN (NITROSTAT) 0.4 MG SL tablet Place 1 tablet (0.4 mg total) under the tongue every 5 (five) minutes x 3 doses as needed for chest pain. 25 tablet 3  . pantoprazole (PROTONIX) 40 MG tablet TAKE 1 TABLET EVERY DAY (Patient taking differently: TAKE 1 TABLET  EVERY DAY) 90 tablet 3  . tamsulosin (FLOMAX) 0.4 MG CAPS capsule     . tetrahydrozoline-zinc (VISINE-AC) 0.05-0.25 % ophthalmic solution 2 drops 3 (three) times daily as needed.    . warfarin (COUMADIN) 2 MG tablet Take 2 mg by mouth daily. MANAGED BY PMD     No current facility-administered medications for this visit.     Past Medical History:  Diagnosis Date  . AAA (abdominal aortic aneurysm) ([place])    Followed by Dr. Curt Jews  . Arthritis   . CAD (coronary artery disease)   . Cancer [facility])    Prostate:  Radiation Tx  . Carotid artery disease ([place])    Doppler 08/10/2010,  no change,  RICA 2-83%, LICA 66-29%  . Chest pain    precordial. mild chronic .[name]... nonischemic  . Coronary artery disease    a. Nuclear, January, 2008, no ischemia b. Cath 08/2012- 1/4 patent grafts, RCA CTO, no flow-limiting disease, medically managed  . Diabetes mellitus without complication ([place])   . Dizziness 02/2011  . Dyslipidemia   . Edema   . Ejection fraction    Normal LV, nuclear, 2008, ( no echo data as July 31, 2010)  . GERD (gastroesophageal reflux disease)    TAKES TUMS & ROLAIDS AS NEEDED  . Hx of CABG 2000  . Hypertension   . Myocardial infarction ([place])  1999  . Neck pain 02/2011  . Pneumonia   . Renal artery stenosis ([facility])    50-70%  . S/P femoropopliteal bypass surgery    Dr. Donnetta Hutching  . Sinus bradycardia    Asymptomatic    Past Surgical History:  Procedure Laterality Date  . CARDIAC CATHETERIZATION  09/26/2012   1/4 patent bypass (occluded SVG-PDA, SVG-OM, LIMA-LAD), SVG-diagonal patent and fills the diagonal and LAD, distal RCA occlusion with left to right collateralization, patent circumflex, LAD with no flow-limiting disease and antegrade flow competitively from SVG-diagonal; EF 60-65%  . COLONOSCOPY  11/30/2009   [id number]. next TCS 11/2019  . CORONARY ARTERY BYPASS GRAFT  2000  . ESOPHAGOGASTRODUODENOSCOPY N/A 08/12/2013   [id number] peptic stricture with erosive refluxesophagitis - status post Maloney dilation. Hiatal hernia. Abnormalgastric mucosa. Deformity of the pyloric channel suggestive ofprior peptic ulcer disease. Duodenal bulbar diverticulum Statuspost gastric biopsy. h.pylori  . LEFT HEART CATHETERIZATION WITH CORONARY/GRAFT ANGIOGRAM N/A 09/26/2012   Procedure: LEFT HEART CATHETERIZATION WITH Beatrix Fetters;  Surgeon: Burnell Blanks, MD;  Location: [facility] CATH LAB;  Service: Cardiovascular;  Laterality: N/A;  . Venia Minks DILATION N/A 08/12/2013   Procedure: Venia Minks DILATION;  Surgeon: Daneil Dolin, MD;  Location[facility] ENDO SUITE;  Service: Endoscopy;  Laterality: N/A;  . PR VEIN BYPASS GRAFT,AORTO-FEM-POP Right 07/19/1999  . PR VEIN BYPASS GRAFT,AORTO-FEM-POP Left 05/02/2006  . SAVORY DILATION N/A 08/12/2013   Procedure: SAVORY DILATION;  Surgeon: Daneil Dolin, MD;  Location: [facility] ENDO SUITE;  Service: Endoscopy;  Laterality: N/A;  . WRIST SURGERY     cyst removal    Social History   Socioeconomic History  . Marital status: Married    Spouse name: Not on file  . Number of children: Not on file  . Years of education: Not on file  . Highest education level: Not on file  Occupational  History  . Not on file  Social Needs  . Financial resource strain: Not on file  . Food insecurity:    Worry: Not on file    Inability: Not on file  . Transportation needs:    Medical: Not on file    Non-medical: Not on file  Tobacco Use  . Smoking status: Former Smoker    Last attempt to quit: 04/02/1998    Years since quitting: 19.5  . Smokeless tobacco: Never Used  Substance and Sexual Activity  . Alcohol use: Yes    Alcohol/week: 0.0 - 1.2 oz    Comment: OCCASIONAL  . Drug use: No  . Sexual activity: Not on file  Lifestyle  . Physical activity:    Days per week: Not on file    Minutes per session: Not on file  . Stress: Not on file  Relationships  . Social connections:    Talks on phone: Not on file    Gets  together: Not on file    Attends religious service: Not on file    Active member of club or organization: Not on file    Attends meetings of clubs or organizations: Not on file    Relationship status: Not on file  . Intimate partner violence:    Fear of current or ex partner: Not on file    Emotionally abused: Not on file    Physically abused: Not on file    Forced sexual activity: Not on file  Other Topics Concern  . Not on file  Social History Narrative  . Not on file     Vitals:   10/30/17 1539  BP: 132/60  Pulse: (!) 57  SpO2: 92%  Weight: 228 lb (103.4 kg)  Height: 5\' 10"  (1.778 m)    Wt Readings from Last 3 Encounters:  10/30/17 228 lb (103.4 kg)  08/20/17 222 lb (100.7 kg)  10/12/16 223 lb 6.4 oz (101.3 kg)     PHYSICAL EXAM General: NAD HEENT: Normal. Neck: No JVD, no thyromegaly. Lungs: Diminished sounds throughout, no crackles or wheezes. CV: Bradycardic, irregular rhythm, normal S1/S2, no S3, no murmur.  Chronic bilateral lower extremity edema.   Abdomen: Soft, nontender, no distention.  Neurologic: Alert and oriented.  Psych: Normal affect. Skin: Normal. Musculoskeletal: No gross deformities.    ECG: Reviewed above under  Subjective   Labs: Lab Results  Component Value Date/Time   K 4.2 12/11/2016 09:50 AM   BUN 16 12/11/2016 09:50 AM   CREATININE 0.75 12/11/2016 09:50 AM   ALT 20 09/09/2007 08:38 AM   TSH 0.531 09/24/2012 04:48 PM   HGB 13.8 09/27/2012 05:40 AM     Lipids: Lab Results  Component Value Date/Time   LDLCALC 68 09/25/2012 06:18 AM   CHOL 129 09/25/2012 06:18 AM   TRIG 152 (H) 09/25/2012 06:18 AM   HDL 31 (L) 09/25/2012 06:18 AM       ASSESSMENT AND PLAN:  1. CAD with CABG and 1/4 patent bypass grafts in 08/2012: Symptomatically stable. Continue ASA, statin, and Imdur.  Given bradycardia, I will stop metoprolol.  I will provide him with a prescription for sublingual nitroglycerin.  2. Permanent atrial fibrillation: Symptomatically stable.  Continue therapy with warfarin.  Given bradycardia with metoprolol, I will discontinue this.  3. Essential HTN: Controlled. No changes.  4. Bilateral carotid artery stenosis: Followed by vascular surgery. Continue ASA and statin.  1 to 39% right and 40 to 59% left internal carotid artery stenosis in May 2019.  Followed by vascular surgery.  5. PVD with bilateral femoral-popliteal bypass graft surgery as detailed above: Followed by vascular surgery. Continue aspirin and statin.  6. Bilateral leg edema/chronic venous insufficiency: Lasix did not make a difference. He does not like wearing compression stockings but a new pair is being sent to him by the [place].      Disposition: Follow up 1 year   Kate Sable, M.D., F.A.C.C.

## 2017-10-31 DIAGNOSIS — E119 Type 2 diabetes mellitus without complications: Secondary | ICD-10-CM | POA: Diagnosis not present

## 2017-10-31 DIAGNOSIS — H524 Presbyopia: Secondary | ICD-10-CM | POA: Diagnosis not present

## 2017-10-31 DIAGNOSIS — H5211 Myopia, right eye: Secondary | ICD-10-CM | POA: Diagnosis not present

## 2017-10-31 DIAGNOSIS — H52221 Regular astigmatism, right eye: Secondary | ICD-10-CM | POA: Diagnosis not present

## 2017-10-31 DIAGNOSIS — E1165 Type 2 diabetes mellitus with hyperglycemia: Secondary | ICD-10-CM | POA: Diagnosis not present

## 2017-11-19 ENCOUNTER — Ambulatory Visit: Payer: Medicare HMO | Admitting: Family

## 2017-11-19 ENCOUNTER — Other Ambulatory Visit (HOSPITAL_COMMUNITY): Payer: Medicare HMO

## 2017-11-19 ENCOUNTER — Encounter (HOSPITAL_COMMUNITY): Payer: Medicare HMO

## 2017-11-28 DIAGNOSIS — Z7901 Long term (current) use of anticoagulants: Secondary | ICD-10-CM | POA: Diagnosis not present

## 2017-12-19 DIAGNOSIS — E118 Type 2 diabetes mellitus with unspecified complications: Secondary | ICD-10-CM | POA: Diagnosis not present

## 2017-12-19 DIAGNOSIS — E782 Mixed hyperlipidemia: Secondary | ICD-10-CM | POA: Diagnosis not present

## 2017-12-19 DIAGNOSIS — Z23 Encounter for immunization: Secondary | ICD-10-CM | POA: Diagnosis not present

## 2017-12-19 DIAGNOSIS — I2510 Atherosclerotic heart disease of native coronary artery without angina pectoris: Secondary | ICD-10-CM | POA: Diagnosis not present

## 2017-12-19 DIAGNOSIS — I701 Atherosclerosis of renal artery: Secondary | ICD-10-CM | POA: Diagnosis not present

## 2017-12-19 DIAGNOSIS — I10 Essential (primary) hypertension: Secondary | ICD-10-CM | POA: Diagnosis not present

## 2017-12-19 DIAGNOSIS — N189 Chronic kidney disease, unspecified: Secondary | ICD-10-CM | POA: Diagnosis not present

## 2017-12-19 DIAGNOSIS — I4891 Unspecified atrial fibrillation: Secondary | ICD-10-CM | POA: Diagnosis not present

## 2017-12-19 DIAGNOSIS — E6609 Other obesity due to excess calories: Secondary | ICD-10-CM | POA: Diagnosis not present

## 2017-12-19 DIAGNOSIS — Z6833 Body mass index (BMI) 33.0-33.9, adult: Secondary | ICD-10-CM | POA: Diagnosis not present

## 2018-01-02 DIAGNOSIS — Z7901 Long term (current) use of anticoagulants: Secondary | ICD-10-CM | POA: Diagnosis not present

## 2018-02-18 DIAGNOSIS — Z7901 Long term (current) use of anticoagulants: Secondary | ICD-10-CM | POA: Diagnosis not present

## 2018-03-13 DIAGNOSIS — R490 Dysphonia: Secondary | ICD-10-CM | POA: Diagnosis not present

## 2018-03-13 DIAGNOSIS — R1312 Dysphagia, oropharyngeal phase: Secondary | ICD-10-CM | POA: Diagnosis not present

## 2018-03-13 DIAGNOSIS — R1313 Dysphagia, pharyngeal phase: Secondary | ICD-10-CM | POA: Diagnosis not present

## 2018-03-13 DIAGNOSIS — R1310 Dysphagia, unspecified: Secondary | ICD-10-CM | POA: Diagnosis not present

## 2018-03-13 DIAGNOSIS — R633 Feeding difficulties: Secondary | ICD-10-CM | POA: Diagnosis not present

## 2018-04-24 ENCOUNTER — Ambulatory Visit (HOSPITAL_COMMUNITY)
Admission: RE | Admit: 2018-04-24 | Discharge: 2018-04-24 | Disposition: A | Discharge status: Home / Self Care | Payer: Medicare HMO | Source: Ambulatory Visit | Attending: Family Medicine | Admitting: Family Medicine

## 2018-04-24 ENCOUNTER — Other Ambulatory Visit (HOSPITAL_COMMUNITY): Payer: Self-pay | Admitting: Family Medicine

## 2018-04-24 DIAGNOSIS — Z0001 Encounter for general adult medical examination with abnormal findings: Secondary | ICD-10-CM | POA: Diagnosis not present

## 2018-04-24 DIAGNOSIS — Z1389 Encounter for screening for other disorder: Secondary | ICD-10-CM | POA: Diagnosis not present

## 2018-04-24 DIAGNOSIS — R059 Cough, unspecified: Secondary | ICD-10-CM

## 2018-04-24 DIAGNOSIS — E6609 Other obesity due to excess calories: Secondary | ICD-10-CM | POA: Diagnosis not present

## 2018-04-24 DIAGNOSIS — R05 Cough: Secondary | ICD-10-CM | POA: Diagnosis not present

## 2018-04-24 DIAGNOSIS — J069 Acute upper respiratory infection, unspecified: Secondary | ICD-10-CM | POA: Diagnosis not present

## 2018-04-24 DIAGNOSIS — E7849 Other hyperlipidemia: Secondary | ICD-10-CM | POA: Diagnosis not present

## 2018-04-24 DIAGNOSIS — R0789 Other chest pain: Secondary | ICD-10-CM

## 2018-04-24 DIAGNOSIS — Z6834 Body mass index (BMI) 34.0-34.9, adult: Secondary | ICD-10-CM | POA: Diagnosis not present

## 2018-04-24 DIAGNOSIS — E118 Type 2 diabetes mellitus with unspecified complications: Secondary | ICD-10-CM | POA: Diagnosis not present

## 2018-04-24 DIAGNOSIS — I10 Essential (primary) hypertension: Secondary | ICD-10-CM | POA: Diagnosis not present

## 2018-04-24 DIAGNOSIS — Z7901 Long term (current) use of anticoagulants: Secondary | ICD-10-CM | POA: Diagnosis not present

## 2018-06-03 DIAGNOSIS — Z7901 Long term (current) use of anticoagulants: Secondary | ICD-10-CM | POA: Diagnosis not present

## 2018-06-12 DIAGNOSIS — R1312 Dysphagia, oropharyngeal phase: Secondary | ICD-10-CM | POA: Diagnosis not present

## 2018-06-12 DIAGNOSIS — R490 Dysphonia: Secondary | ICD-10-CM | POA: Diagnosis not present

## 2018-06-12 DIAGNOSIS — R633 Feeding difficulties: Secondary | ICD-10-CM | POA: Diagnosis not present

## 2018-06-12 DIAGNOSIS — R1319 Other dysphagia: Secondary | ICD-10-CM | POA: Diagnosis not present

## 2018-07-03 DIAGNOSIS — Z7901 Long term (current) use of anticoagulants: Secondary | ICD-10-CM | POA: Diagnosis not present

## 2018-07-04 DIAGNOSIS — Z7901 Long term (current) use of anticoagulants: Secondary | ICD-10-CM | POA: Diagnosis not present

## 2018-07-09 DIAGNOSIS — Z7901 Long term (current) use of anticoagulants: Secondary | ICD-10-CM | POA: Diagnosis not present

## 2018-07-14 DIAGNOSIS — Z7901 Long term (current) use of anticoagulants: Secondary | ICD-10-CM | POA: Diagnosis not present

## 2018-07-28 DIAGNOSIS — Z7901 Long term (current) use of anticoagulants: Secondary | ICD-10-CM | POA: Diagnosis not present

## 2018-08-04 DIAGNOSIS — Z7901 Long term (current) use of anticoagulants: Secondary | ICD-10-CM | POA: Diagnosis not present

## 2018-08-11 DIAGNOSIS — Z7901 Long term (current) use of anticoagulants: Secondary | ICD-10-CM | POA: Diagnosis not present

## 2018-08-26 DIAGNOSIS — Z7901 Long term (current) use of anticoagulants: Secondary | ICD-10-CM | POA: Diagnosis not present

## 2018-09-17 DIAGNOSIS — Z7901 Long term (current) use of anticoagulants: Secondary | ICD-10-CM | POA: Diagnosis not present

## 2018-09-19 DIAGNOSIS — H5210 Myopia, unspecified eye: Secondary | ICD-10-CM | POA: Diagnosis not present

## 2018-10-06 DIAGNOSIS — Z1389 Encounter for screening for other disorder: Secondary | ICD-10-CM | POA: Diagnosis not present

## 2018-10-06 DIAGNOSIS — I2510 Atherosclerotic heart disease of native coronary artery without angina pectoris: Secondary | ICD-10-CM | POA: Diagnosis not present

## 2018-10-06 DIAGNOSIS — Z6834 Body mass index (BMI) 34.0-34.9, adult: Secondary | ICD-10-CM | POA: Diagnosis not present

## 2018-10-06 DIAGNOSIS — E785 Hyperlipidemia, unspecified: Secondary | ICD-10-CM | POA: Diagnosis not present

## 2018-10-06 DIAGNOSIS — I10 Essential (primary) hypertension: Secondary | ICD-10-CM | POA: Diagnosis not present

## 2018-10-06 DIAGNOSIS — I4891 Unspecified atrial fibrillation: Secondary | ICD-10-CM | POA: Diagnosis not present

## 2018-10-06 DIAGNOSIS — R600 Localized edema: Secondary | ICD-10-CM | POA: Diagnosis not present

## 2018-10-06 DIAGNOSIS — E7849 Other hyperlipidemia: Secondary | ICD-10-CM | POA: Diagnosis not present

## 2018-10-06 DIAGNOSIS — E1165 Type 2 diabetes mellitus with hyperglycemia: Secondary | ICD-10-CM | POA: Diagnosis not present

## 2018-10-06 DIAGNOSIS — E6609 Other obesity due to excess calories: Secondary | ICD-10-CM | POA: Diagnosis not present

## 2018-10-13 DIAGNOSIS — Z7901 Long term (current) use of anticoagulants: Secondary | ICD-10-CM | POA: Diagnosis not present

## 2018-11-13 DIAGNOSIS — Z7901 Long term (current) use of anticoagulants: Secondary | ICD-10-CM | POA: Diagnosis not present

## 2018-12-01 ENCOUNTER — Encounter: Payer: Self-pay | Admitting: Cardiovascular Disease

## 2018-12-01 ENCOUNTER — Other Ambulatory Visit: Payer: Self-pay

## 2018-12-01 ENCOUNTER — Ambulatory Visit: Payer: Medicare HMO | Admitting: Cardiovascular Disease

## 2018-12-01 VITALS — BP 146/73 | HR 85 | Temp 97.5°F | Ht 70.5 in | Wt 228.0 lb

## 2018-12-01 DIAGNOSIS — I25708 Atherosclerosis of coronary artery bypass graft(s), unspecified, with other forms of angina pectoris: Secondary | ICD-10-CM

## 2018-12-01 DIAGNOSIS — R001 Bradycardia, unspecified: Secondary | ICD-10-CM

## 2018-12-01 DIAGNOSIS — E782 Mixed hyperlipidemia: Secondary | ICD-10-CM | POA: Diagnosis not present

## 2018-12-01 DIAGNOSIS — I10 Essential (primary) hypertension: Secondary | ICD-10-CM | POA: Diagnosis not present

## 2018-12-01 DIAGNOSIS — I739 Peripheral vascular disease, unspecified: Secondary | ICD-10-CM | POA: Diagnosis not present

## 2018-12-01 DIAGNOSIS — E1165 Type 2 diabetes mellitus with hyperglycemia: Secondary | ICD-10-CM | POA: Diagnosis not present

## 2018-12-01 DIAGNOSIS — I4821 Permanent atrial fibrillation: Secondary | ICD-10-CM | POA: Diagnosis not present

## 2018-12-01 DIAGNOSIS — I6529 Occlusion and stenosis of unspecified carotid artery: Secondary | ICD-10-CM

## 2018-12-01 DIAGNOSIS — R600 Localized edema: Secondary | ICD-10-CM

## 2018-12-01 DIAGNOSIS — N189 Chronic kidney disease, unspecified: Secondary | ICD-10-CM | POA: Diagnosis not present

## 2018-12-01 NOTE — Patient Instructions (Signed)
Medication Instructions: Your physician recommends that you continue on your current medications as directed. Please refer to the Current Medication list given to you today.   Labwork: none  Procedures/Testing: none  Follow-Up: 6 months with Dr.Koneswaran  Any Additional Special Instructions Will Be Listed Below (If Applicable).     If you need a refill on your cardiac medications before your next appointment, please call your pharmacy.

## 2018-12-01 NOTE — Progress Notes (Signed)
SUBJECTIVE: The patient presents for routine follow-up. His wife, Mardene Celeste, is also my patient. He has a history of coronary artery disease and bypass graft surgery. Coronary angiography on 09/26/2012 demonstrated one out of 4 patent bypass grafts. He also has a history of atrial fibrillation , hypertension, dyslipidemia, carotid artery disease, and peripheral vascular disease.  Echocardiogram 06/21/15: Normal left ventricular systolic function and regional wall motion, LVEF 55-60%, mild LVH, mild mitral regurgitation, severe left atrial dilatation, mild to moderate right ventricular and mild right atrial enlargement, moderately elevated pulmonary pressures, 59 mmHg.  He underwent left femoral-popliteal bypass surgery on 04/12/06 and right femoral-popliteal bypass surgery on 07/19/99. The right-sided graft is known to be occluded.  He also has an infrarenal abdominal aortic aneurysm.  He has chronic venous insufficiency with bilateral leg swelling.  He very seldom has chest pain.  He has chronic exertional dyspnea which is stable.  He now wears compression stockings.   Review of Systems: As per "subjective", otherwise negative.  Allergies  Allergen Reactions  . Niacin Itching and Rash    Burning sensation  . Iodine-131 Nausea And Vomiting  . Nsaids Other (See Comments)    Taking Coumadin    Current Outpatient Medications  Medication Sig Dispense Refill  . amLODipine (NORVASC) 10 MG tablet Take 10 mg by mouth daily.      [name] aspirin EC 81 MG EC tablet Take 1 tablet (81 mg total) by mouth daily. 30 tablet 3  . atorvastatin (LIPITOR) 40 MG tablet Take 1 tablet (40 mg total) by mouth daily. 90 tablet 1  . furosemide (LASIX) 40 MG tablet Take 40 mg by mouth daily.      . Isopropyl Alcohol (ALCOHOL PREP PADS EX)     . isosorbide mononitrate (IMDUR) 30 MG 24 hr tablet Take 1 tablet (30 mg total) by mouth daily. 90 tablet 1  . losartan (COZAAR) 100 MG tablet Take 100 mg by mouth  daily.      . metFORMIN (GLUCOPHAGE) 1000 MG tablet Take 1 tablet (1,000 mg total) by mouth 2 (two) times daily with a meal.    . nitroGLYCERIN (NITROSTAT) 0.4 MG SL tablet Place 1 tablet (0.4 mg total) under the tongue every 5 (five) minutes x 3 doses as needed for chest pain. 25 tablet 3  . pantoprazole (PROTONIX) 40 MG tablet TAKE 1 TABLET EVERY DAY (Patient taking differently: TAKE 1 TABLET  EVERY DAY) 90 tablet 3  . tamsulosin (FLOMAX) 0.4 MG CAPS capsule     . tetrahydrozoline-zinc (VISINE-AC) 0.05-0.25 % ophthalmic solution 2 drops 3 (three) times daily as needed.    . warfarin (COUMADIN) 2 MG tablet Take 2 mg by mouth daily. MANAGED BY PMD     No current facility-administered medications for this visit.     Past Medical History:  Diagnosis Date  . AAA (abdominal aortic aneurysm) ([place])    Followed by Dr. Curt Jews  . Arthritis   . CAD (coronary artery disease)   . Cancer [facility])    Prostate:  Radiation Tx  . Carotid artery disease ([place])    Doppler 08/10/2010,  no change, RICA [id number], LICA [id number]  . Chest pain    precordial. mild chronic .[name]... nonischemic  . Coronary artery disease    a. Nuclear, January, 2008, no ischemia b. Cath 08/2012- 1/4 patent grafts, RCA CTO, no flow-limiting disease, medically managed  . Diabetes mellitus without complication ([place])   . Dizziness 02/2011  . Dyslipidemia   .  Edema   . Ejection fraction    Normal LV, nuclear, 2008, ( no echo data as July 31, 2010)  . GERD (gastroesophageal reflux disease)    TAKES TUMS & ROLAIDS AS NEEDED  . Hx of CABG 2000  . Hypertension   . Myocardial infarction ([place]) 1999  . Neck pain 02/2011  . Pneumonia   . Renal artery stenosis ([facility])    50-70%  . S/P femoropopliteal bypass surgery    Dr. Donnetta Hutching  . Sinus bradycardia    Asymptomatic    Past Surgical History:  Procedure Laterality Date  . CARDIAC CATHETERIZATION  09/26/2012   1/4 patent bypass (occluded SVG-PDA, SVG-OM, LIMA-LAD), SVG-diagonal patent and  fills the diagonal and LAD, distal RCA occlusion with left to right collateralization, patent circumflex, LAD with no flow-limiting disease and antegrade flow competitively from SVG-diagonal; EF 60-65%  . COLONOSCOPY  11/30/2009   [id number]. next TCS 11/2019  . CORONARY ARTERY BYPASS GRAFT  2000  . ESOPHAGOGASTRODUODENOSCOPY N/A 08/12/2013   [id number] peptic stricture with erosive refluxesophagitis - status post Maloney dilation. Hiatal hernia. Abnormalgastric mucosa. Deformity of the pyloric channel suggestive ofprior peptic ulcer disease. Duodenal bulbar diverticulum Statuspost gastric biopsy. h.pylori  . LEFT HEART CATHETERIZATION WITH CORONARY/GRAFT ANGIOGRAM N/A 09/26/2012   Procedure: LEFT HEART CATHETERIZATION WITH Beatrix Fetters;  Surgeon: Burnell Blanks, MD;  Location: [facility] CATH LAB;  Service: Cardiovascular;  Laterality: N/A;  . Venia Minks DILATION N/A 08/12/2013   Procedure: Venia Minks DILATION;  Surgeon: Daneil Dolin, MD;  [facility] ENDO SU[facility];  Service: Endoscopy;  Laterality: N/A;  . PR VEIN BYPASS GRAFT,AORTO-FEM-POP Right 07/19/1999  . PR VEIN BYPASS GRAFT,AORTO-FEM-POP Left 05/02/2006  . SAVORY DILATION N/A 08/12/2013   Procedure: SAVORY DILATION;  Surgeon: Daneil Dolin, MD;  [facility] ENDO [facility];  Service: Endoscopy;  Laterality: N/A;  . WRIST SURGERY     cyst removal    Social History   Socioeconomic History  . Marital status: Married    Spouse name: Not on file  . Number of children: Not on file  . Years of education: Not on file  . Highest education level: Not on file  Occupational History  . Not on file  Social Needs  . Financial resource strain: Not on file  . Food insecurity    Worry: Not on file    Inability: Not on file  . Transportation needs    Medical: Not on file    Non-medical: Not on file  Tobacco Use  . Smoking status: Former Smoker    Quit date: 04/02/1998    Years since quitting: 20.6  . Smokeless tobacco:  Never Used  Substance and Sexual Activity  . Alcohol use: Yes    Alcohol/week: 0.0 - 2.0 standard drinks    Comment: OCCASIONAL  . Drug use: Yes    Types: Methamphetamines  . Sexual activity: Not on file  Lifestyle  . Physical activity    Days per week: Not on file    Minutes per session: Not on file  . Stress: Not on file  Relationships  . Social Herbalist on phone: Not on file    Gets together: Not on file    Attends religious service: Not on file    Active member of club or organization: Not on file    Attends meetings of clubs or organizations: Not on file    Relationship status: Not on file  . Intimate partner violence    Fear of current or  ex partner: Not on file    Emotionally abused: Not on file    Physically abused: Not on file    Forced sexual activity: Not on file  Other Topics Concern  . Not on file  Social History Narrative  . Not on file     Vitals:   12/01/18 1348  BP: (!) 146/73  Pulse: 85  Temp: (!) 97.5 F (36.4 C)  SpO2: 97%  Height: 5' 10.5" (1.791 m)    Wt Readings from Last 3 Encounters:  10/30/17 228 lb (103.4 kg)  08/20/17 222 lb (100.7 kg)  10/12/16 223 lb 6.4 oz (101.3 kg)     PHYSICAL EXAM General: NAD HEENT: Normal. Neck: No JVD, no thyromegaly. Lungs: Diminished sounds throughout, no crackles or wheezes. CV: Bradycardic, irregular rhythm, normal S1/S2, no S3, no murmur.  Chronic bilateral lower extremity edema.    Abdomen: Soft, nontender, no distention.  Neurologic: Alert and oriented.  Psych: Normal affect. Skin: Normal. Musculoskeletal: No gross deformities.    ECG: Reviewed above under Subjective   Labs: Lab Results  Component Value Date/Time   K 4.2 12/11/2016 09:50 AM   BUN 16 12/11/2016 09:50 AM   CREATININE 0.75 12/11/2016 09:50 AM   ALT 20 09/09/2007 08:38 AM   TSH 0.531 09/24/2012 04:48 PM   HGB 13.8 09/27/2012 05:40 AM     Lipids: Lab Results  Component Value Date/Time   LDLCALC 68  09/25/2012 06:18 AM   CHOL 129 09/25/2012 06:18 AM   TRIG 152 (H) 09/25/2012 06:18 AM   HDL 31 (L) 09/25/2012 06:18 AM       ASSESSMENT AND PLAN: 1. CAD with CABG and 1/4 patent bypass grafts in 08/2012:Symptomatically stable.Continue ASA, statin, and Imdur. Beta-blockers previously led to bradycardia.  2. Permanent atrial fibrillation: Symptomatically stable.  Continue therapy with warfarin.    No beta-blockers due to bradycardia.  3. Essential HTN: Mildly elevated.  This will need further monitoring. No changes.  4. Bilateral carotid artery stenosis:Followed by vascular surgery.Continue ASA and statin.  1 to 39% right and 40 to 59% left internal carotid artery stenosis in May 2019.  Followed by vascular surgery.  5. PVDwith bilateral femoral-popliteal bypass graft surgery as detailed above: Followed by vascular surgery.Continue aspirin and statin.  6. Bilateral leg edema/chronic venous insufficiency:Lasix did not make a difference. He is now wearing compression stockings.    Disposition: Follow up 6 months   Kate Sable, M.D., F.A.C.C.

## 2018-12-09 DIAGNOSIS — Z23 Encounter for immunization: Secondary | ICD-10-CM | POA: Diagnosis not present

## 2018-12-11 DIAGNOSIS — J3801 Paralysis of vocal cords and larynx, unilateral: Secondary | ICD-10-CM | POA: Diagnosis not present

## 2018-12-11 DIAGNOSIS — R1310 Dysphagia, unspecified: Secondary | ICD-10-CM | POA: Diagnosis not present

## 2018-12-11 DIAGNOSIS — J383 Other diseases of vocal cords: Secondary | ICD-10-CM | POA: Diagnosis not present

## 2018-12-11 DIAGNOSIS — Z7901 Long term (current) use of anticoagulants: Secondary | ICD-10-CM | POA: Diagnosis not present

## 2018-12-11 DIAGNOSIS — Z7982 Long term (current) use of aspirin: Secondary | ICD-10-CM | POA: Diagnosis not present

## 2018-12-11 DIAGNOSIS — R633 Feeding difficulties: Secondary | ICD-10-CM | POA: Diagnosis not present

## 2018-12-11 DIAGNOSIS — R490 Dysphonia: Secondary | ICD-10-CM | POA: Diagnosis not present

## 2018-12-11 DIAGNOSIS — Z9889 Other specified postprocedural states: Secondary | ICD-10-CM | POA: Diagnosis not present

## 2018-12-11 DIAGNOSIS — R1312 Dysphagia, oropharyngeal phase: Secondary | ICD-10-CM | POA: Diagnosis not present

## 2018-12-23 DIAGNOSIS — Z7901 Long term (current) use of anticoagulants: Secondary | ICD-10-CM | POA: Diagnosis not present

## 2018-12-29 DIAGNOSIS — Z7901 Long term (current) use of anticoagulants: Secondary | ICD-10-CM | POA: Diagnosis not present

## 2018-12-31 DIAGNOSIS — I10 Essential (primary) hypertension: Secondary | ICD-10-CM | POA: Diagnosis not present

## 2018-12-31 DIAGNOSIS — E119 Type 2 diabetes mellitus without complications: Secondary | ICD-10-CM | POA: Diagnosis not present

## 2018-12-31 DIAGNOSIS — N189 Chronic kidney disease, unspecified: Secondary | ICD-10-CM | POA: Diagnosis not present

## 2018-12-31 DIAGNOSIS — E782 Mixed hyperlipidemia: Secondary | ICD-10-CM | POA: Diagnosis not present

## 2019-01-02 DIAGNOSIS — Z7901 Long term (current) use of anticoagulants: Secondary | ICD-10-CM | POA: Diagnosis not present

## 2019-01-09 DIAGNOSIS — Z7901 Long term (current) use of anticoagulants: Secondary | ICD-10-CM | POA: Diagnosis not present

## 2019-01-12 DIAGNOSIS — I10 Essential (primary) hypertension: Secondary | ICD-10-CM | POA: Diagnosis not present

## 2019-01-12 DIAGNOSIS — I2510 Atherosclerotic heart disease of native coronary artery without angina pectoris: Secondary | ICD-10-CM | POA: Diagnosis not present

## 2019-01-12 DIAGNOSIS — I252 Old myocardial infarction: Secondary | ICD-10-CM | POA: Diagnosis not present

## 2019-01-12 DIAGNOSIS — R1313 Dysphagia, pharyngeal phase: Secondary | ICD-10-CM | POA: Diagnosis not present

## 2019-01-12 DIAGNOSIS — E785 Hyperlipidemia, unspecified: Secondary | ICD-10-CM | POA: Diagnosis not present

## 2019-01-12 DIAGNOSIS — Z951 Presence of aortocoronary bypass graft: Secondary | ICD-10-CM | POA: Diagnosis not present

## 2019-01-12 DIAGNOSIS — I4510 Unspecified right bundle-branch block: Secondary | ICD-10-CM | POA: Diagnosis not present

## 2019-01-12 DIAGNOSIS — J3800 Paralysis of vocal cords and larynx, unspecified: Secondary | ICD-10-CM | POA: Diagnosis not present

## 2019-01-12 DIAGNOSIS — Z01812 Encounter for preprocedural laboratory examination: Secondary | ICD-10-CM | POA: Diagnosis not present

## 2019-01-12 DIAGNOSIS — I4891 Unspecified atrial fibrillation: Secondary | ICD-10-CM | POA: Diagnosis not present

## 2019-01-12 DIAGNOSIS — R600 Localized edema: Secondary | ICD-10-CM | POA: Diagnosis not present

## 2019-01-12 DIAGNOSIS — Z20828 Contact with and (suspected) exposure to other viral communicable diseases: Secondary | ICD-10-CM | POA: Diagnosis not present

## 2019-01-12 DIAGNOSIS — R1312 Dysphagia, oropharyngeal phase: Secondary | ICD-10-CM | POA: Diagnosis not present

## 2019-01-12 DIAGNOSIS — K228 Other specified diseases of esophagus: Secondary | ICD-10-CM | POA: Diagnosis not present

## 2019-01-12 DIAGNOSIS — I493 Ventricular premature depolarization: Secondary | ICD-10-CM | POA: Diagnosis not present

## 2019-01-19 DIAGNOSIS — R600 Localized edema: Secondary | ICD-10-CM | POA: Diagnosis not present

## 2019-01-19 DIAGNOSIS — I10 Essential (primary) hypertension: Secondary | ICD-10-CM | POA: Diagnosis not present

## 2019-01-19 DIAGNOSIS — I2510 Atherosclerotic heart disease of native coronary artery without angina pectoris: Secondary | ICD-10-CM | POA: Diagnosis not present

## 2019-01-19 DIAGNOSIS — Z951 Presence of aortocoronary bypass graft: Secondary | ICD-10-CM | POA: Diagnosis not present

## 2019-01-19 DIAGNOSIS — I252 Old myocardial infarction: Secondary | ICD-10-CM | POA: Diagnosis not present

## 2019-01-19 DIAGNOSIS — K228 Other specified diseases of esophagus: Secondary | ICD-10-CM | POA: Diagnosis not present

## 2019-01-19 DIAGNOSIS — J3800 Paralysis of vocal cords and larynx, unspecified: Secondary | ICD-10-CM | POA: Diagnosis not present

## 2019-01-19 DIAGNOSIS — E785 Hyperlipidemia, unspecified: Secondary | ICD-10-CM | POA: Diagnosis not present

## 2019-01-19 DIAGNOSIS — R1312 Dysphagia, oropharyngeal phase: Secondary | ICD-10-CM | POA: Diagnosis not present

## 2019-01-23 DIAGNOSIS — Z7901 Long term (current) use of anticoagulants: Secondary | ICD-10-CM | POA: Diagnosis not present

## 2019-01-26 DIAGNOSIS — Z7901 Long term (current) use of anticoagulants: Secondary | ICD-10-CM | POA: Diagnosis not present

## 2019-02-05 DIAGNOSIS — Z7901 Long term (current) use of anticoagulants: Secondary | ICD-10-CM | POA: Diagnosis not present

## 2019-02-19 ENCOUNTER — Other Ambulatory Visit: Payer: Self-pay

## 2019-02-19 DIAGNOSIS — Z20822 Contact with and (suspected) exposure to covid-19: Secondary | ICD-10-CM

## 2019-02-22 LAB — NOVEL CORONAVIRUS, NAA: SARS-CoV-2, NAA: NOT DETECTED

## 2019-02-24 ENCOUNTER — Telehealth: Payer: Self-pay | Admitting: *Deleted

## 2019-02-24 NOTE — Telephone Encounter (Signed)
Wife Troy [name] called in husband was with her for his COVID-19 test result.   I let them know his was not detected meaning he did not have the virus.

## 2019-03-06 DIAGNOSIS — Z7901 Long term (current) use of anticoagulants: Secondary | ICD-10-CM | POA: Diagnosis not present

## 2019-03-06 DIAGNOSIS — R600 Localized edema: Secondary | ICD-10-CM | POA: Diagnosis not present

## 2019-03-06 DIAGNOSIS — I10 Essential (primary) hypertension: Secondary | ICD-10-CM | POA: Diagnosis not present

## 2019-03-06 DIAGNOSIS — E118 Type 2 diabetes mellitus with unspecified complications: Secondary | ICD-10-CM | POA: Diagnosis not present

## 2019-03-06 DIAGNOSIS — E7849 Other hyperlipidemia: Secondary | ICD-10-CM | POA: Diagnosis not present

## 2019-03-06 DIAGNOSIS — Z6834 Body mass index (BMI) 34.0-34.9, adult: Secondary | ICD-10-CM | POA: Diagnosis not present

## 2019-03-06 DIAGNOSIS — I2510 Atherosclerotic heart disease of native coronary artery without angina pectoris: Secondary | ICD-10-CM | POA: Diagnosis not present

## 2019-03-06 DIAGNOSIS — C61 Malignant neoplasm of prostate: Secondary | ICD-10-CM | POA: Diagnosis not present

## 2019-03-06 DIAGNOSIS — E6609 Other obesity due to excess calories: Secondary | ICD-10-CM | POA: Diagnosis not present

## 2019-03-06 DIAGNOSIS — I4891 Unspecified atrial fibrillation: Secondary | ICD-10-CM | POA: Diagnosis not present

## 2019-03-23 DIAGNOSIS — Z7901 Long term (current) use of anticoagulants: Secondary | ICD-10-CM | POA: Diagnosis not present

## 2019-04-02 DIAGNOSIS — I25708 Atherosclerosis of coronary artery bypass graft(s), unspecified, with other forms of angina pectoris: Secondary | ICD-10-CM | POA: Diagnosis not present

## 2019-04-02 DIAGNOSIS — N182 Chronic kidney disease, stage 2 (mild): Secondary | ICD-10-CM | POA: Diagnosis not present

## 2019-04-02 DIAGNOSIS — I129 Hypertensive chronic kidney disease with stage 1 through stage 4 chronic kidney disease, or unspecified chronic kidney disease: Secondary | ICD-10-CM | POA: Diagnosis not present

## 2019-04-02 DIAGNOSIS — E1122 Type 2 diabetes mellitus with diabetic chronic kidney disease: Secondary | ICD-10-CM | POA: Diagnosis not present

## 2019-04-07 ENCOUNTER — Ambulatory Visit: Payer: Medicare HMO | Attending: Internal Medicine

## 2019-04-07 ENCOUNTER — Other Ambulatory Visit: Payer: Self-pay

## 2019-04-07 DIAGNOSIS — Z20822 Contact with and (suspected) exposure to covid-19: Secondary | ICD-10-CM | POA: Diagnosis not present

## 2019-04-09 LAB — NOVEL CORONAVIRUS, NAA: SARS-CoV-2, NAA: NOT DETECTED

## 2019-04-14 DIAGNOSIS — Z7901 Long term (current) use of anticoagulants: Secondary | ICD-10-CM | POA: Diagnosis not present

## 2019-04-21 DIAGNOSIS — R490 Dysphonia: Secondary | ICD-10-CM | POA: Diagnosis not present

## 2019-04-21 DIAGNOSIS — Z886 Allergy status to analgesic agent status: Secondary | ICD-10-CM | POA: Diagnosis not present

## 2019-04-21 DIAGNOSIS — R1313 Dysphagia, pharyngeal phase: Secondary | ICD-10-CM | POA: Diagnosis not present

## 2019-04-21 DIAGNOSIS — R1310 Dysphagia, unspecified: Secondary | ICD-10-CM | POA: Diagnosis not present

## 2019-04-21 DIAGNOSIS — R633 Feeding difficulties: Secondary | ICD-10-CM | POA: Diagnosis not present

## 2019-04-21 DIAGNOSIS — Z888 Allergy status to other drugs, medicaments and biological substances status: Secondary | ICD-10-CM | POA: Diagnosis not present

## 2019-04-21 DIAGNOSIS — J3801 Paralysis of vocal cords and larynx, unilateral: Secondary | ICD-10-CM | POA: Diagnosis not present

## 2019-04-23 DIAGNOSIS — Z7901 Long term (current) use of anticoagulants: Secondary | ICD-10-CM | POA: Diagnosis not present

## 2019-05-03 DIAGNOSIS — E1122 Type 2 diabetes mellitus with diabetic chronic kidney disease: Secondary | ICD-10-CM | POA: Diagnosis not present

## 2019-05-03 DIAGNOSIS — N182 Chronic kidney disease, stage 2 (mild): Secondary | ICD-10-CM | POA: Diagnosis not present

## 2019-05-03 DIAGNOSIS — I129 Hypertensive chronic kidney disease with stage 1 through stage 4 chronic kidney disease, or unspecified chronic kidney disease: Secondary | ICD-10-CM | POA: Diagnosis not present

## 2019-05-03 DIAGNOSIS — I25708 Atherosclerosis of coronary artery bypass graft(s), unspecified, with other forms of angina pectoris: Secondary | ICD-10-CM | POA: Diagnosis not present

## 2019-05-07 DIAGNOSIS — I4891 Unspecified atrial fibrillation: Secondary | ICD-10-CM | POA: Diagnosis not present

## 2019-05-07 DIAGNOSIS — Z7901 Long term (current) use of anticoagulants: Secondary | ICD-10-CM | POA: Diagnosis not present

## 2019-05-07 DIAGNOSIS — Z6833 Body mass index (BMI) 33.0-33.9, adult: Secondary | ICD-10-CM | POA: Diagnosis not present

## 2019-05-07 DIAGNOSIS — Z0000 Encounter for general adult medical examination without abnormal findings: Secondary | ICD-10-CM | POA: Diagnosis not present

## 2019-05-07 DIAGNOSIS — Z1389 Encounter for screening for other disorder: Secondary | ICD-10-CM | POA: Diagnosis not present

## 2019-05-25 DIAGNOSIS — Z0000 Encounter for general adult medical examination without abnormal findings: Secondary | ICD-10-CM | POA: Diagnosis not present

## 2019-05-25 DIAGNOSIS — Z7901 Long term (current) use of anticoagulants: Secondary | ICD-10-CM | POA: Diagnosis not present

## 2019-05-25 DIAGNOSIS — Z1389 Encounter for screening for other disorder: Secondary | ICD-10-CM | POA: Diagnosis not present

## 2019-05-25 DIAGNOSIS — I4891 Unspecified atrial fibrillation: Secondary | ICD-10-CM | POA: Diagnosis not present

## 2019-05-27 IMAGING — US US CAROTID DUPLEX BILAT
1 series · 12 of 24 positions shown · non-contrast
Comparison: 08/03/2010

CLINICAL DATA: Bilateral carotid artery stenosis. History of CAD
(post myocardial infarct and CABG), hypertension, hyperlipidemia,
diabetes and smoking.

EXAM:
BILATERAL CAROTID DUPLEX ULTRASOUND
TECHNIQUE: Gray scale imaging, color Doppler and duplex ultrasound were
performed of bilateral carotid and vertebral arteries in the neck.

[Series 1: us carotid duplex bilat · 0.06mm/px · 12 of 69 slices shown]
[im 3/69]
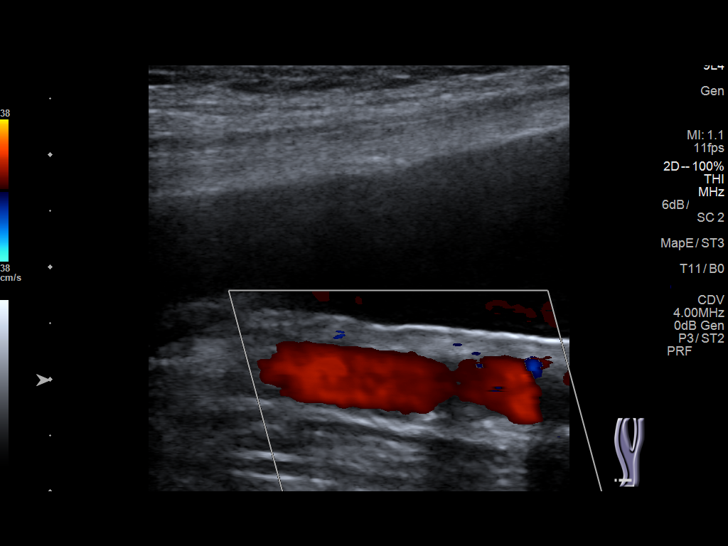
[im 9/69]
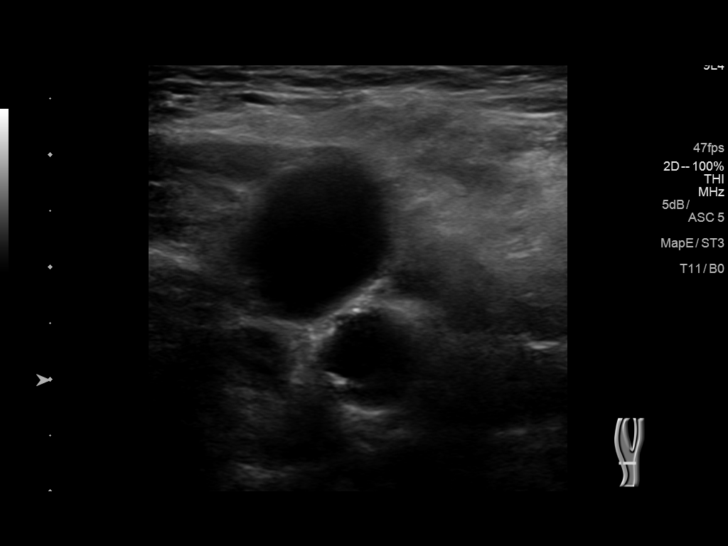
[im 15/69]
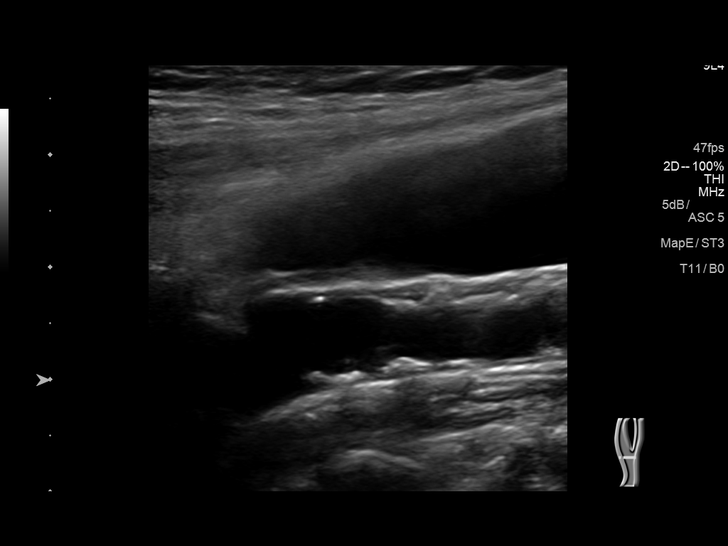
[im 21/69]
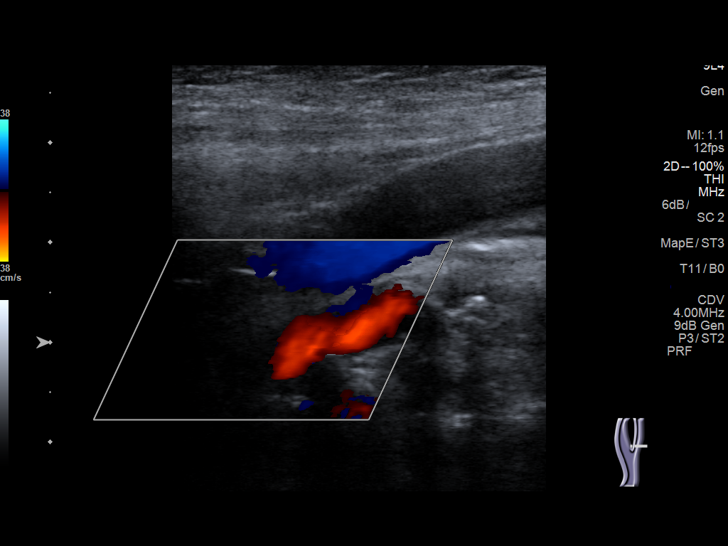
[im 27/69]
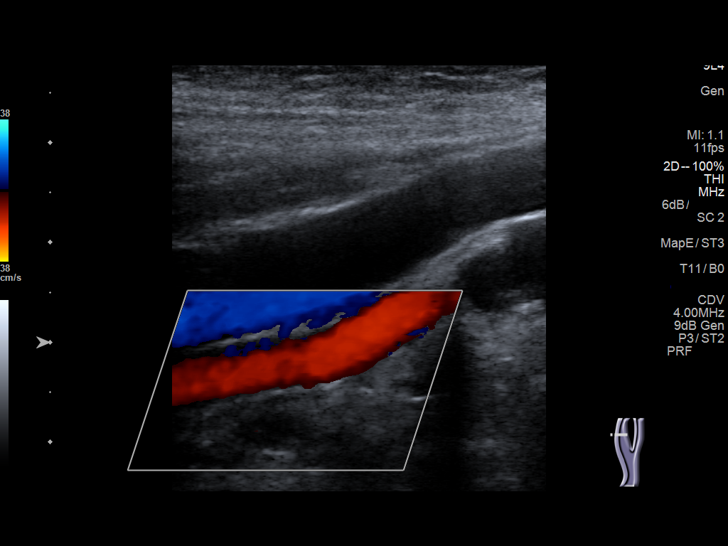
[im 33/69]
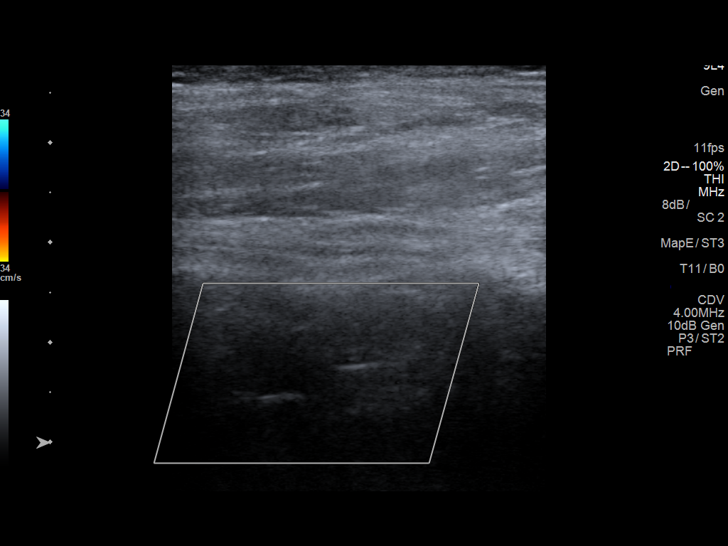
[im 39/69]
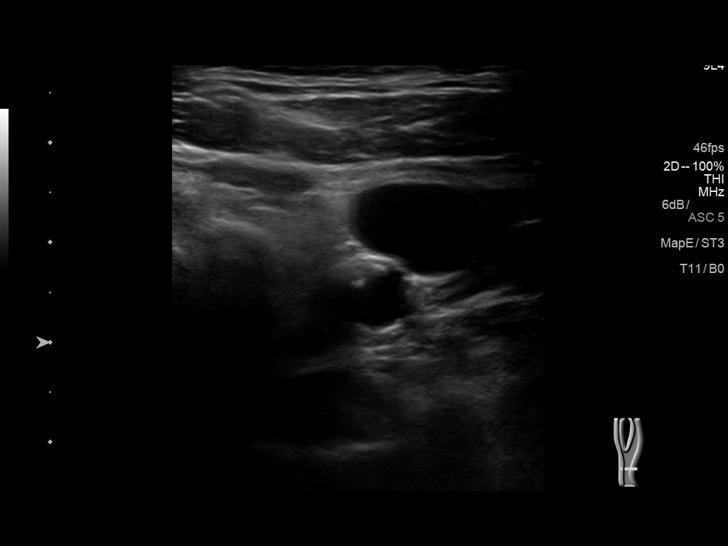
[im 45/69]
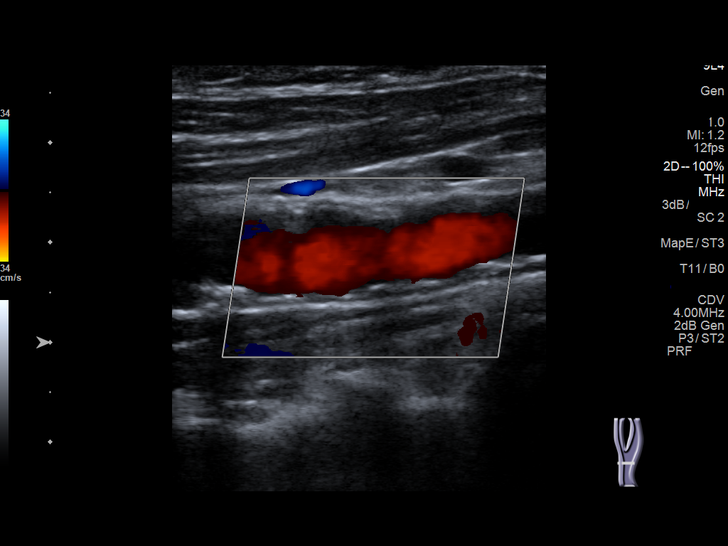
[im 51/69]
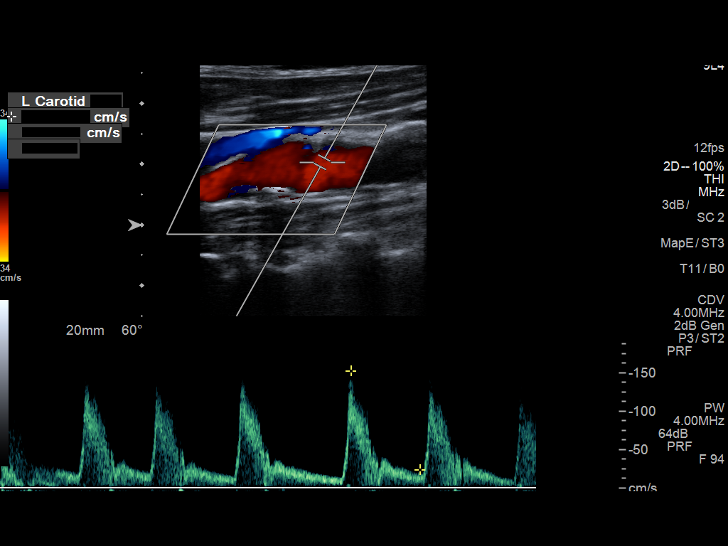
[im 57/69]
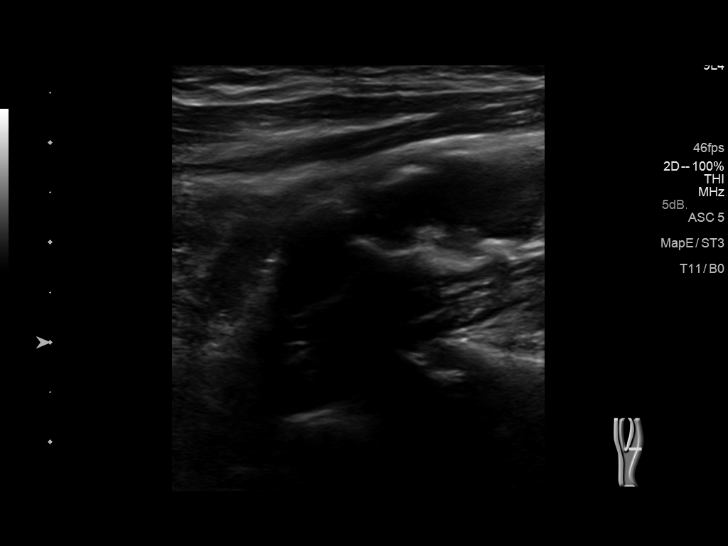
[im 63/69]
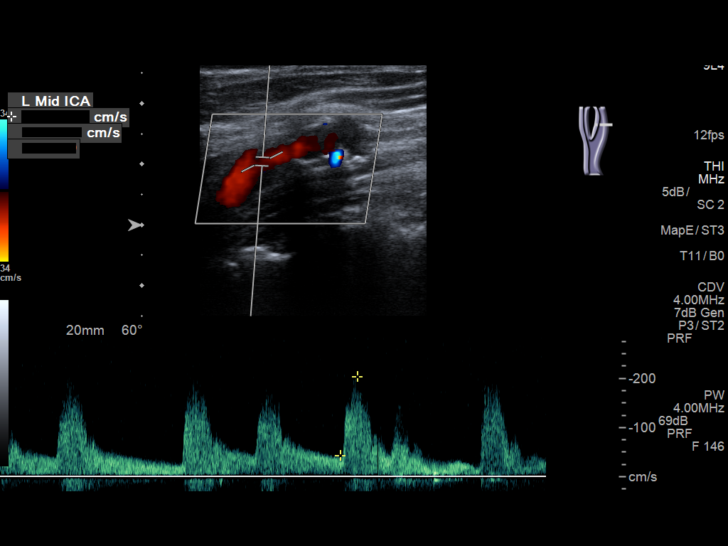
[im 69/69]
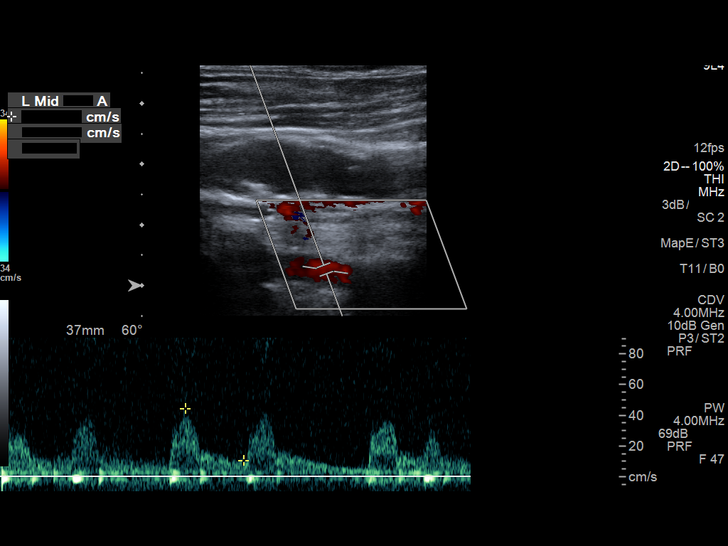

[12 of 24 positions shown; findings below may reference images not displayed]

FINDINGS: Criteria: Quantification of carotid stenosis is based on velocity
parameters that correlate the residual internal carotid diameter
with NASCET-based stenosis levels, using the diameter of the distal
internal carotid lumen as the denominator for stenosis measurement.

The following velocity measurements were obtained:

RIGHT

ICA:  162/21 cm/sec

CCA:  102/17 cm/sec

SYSTOLIC ICA/CCA RATIO:

DIASTOLIC ICA/CCA RATIO:

ECA:  234 cm/sec

LEFT

ICA:  217/40 cm/sec

CCA:  107/15 cm/sec

SYSTOLIC ICA/CCA RATIO:

DIASTOLIC ICA/CCA RATIO:

ECA:  186 cm/sec

RIGHT CAROTID ARTERY: There is a moderate to large amount of
echogenic plaque seen throughout the proximal and mid aspects of the
right common carotid artery (images 7 and 11), progressed compared
to the [DATE] examination. There is a moderate amount of echogenic
atherosclerotic plaque within the right carotid bulb (image 16),
extending to involve the origin and proximal aspects of the right
internal carotid artery (image 24), progressed compared to the
[DATE] examination and again resulting in elevated peak systolic
velocities within the proximal aspect of the right internal carotid
artery (greatest acquired peak systolic velocity with the proximal
right ICA measures 162 cm/sec - image 26, previously, 165 cm/sec

RIGHT VERTEBRAL ARTERY: Not visualized. This represents an interval
change compared to the [DATE] examination.

LEFT CAROTID ARTERY: Moderate to large amount of echogenic plaque
throughout the left common carotid artery (images 37, 41 and 45),
progressed compared to the [DATE] examination. There is a moderate
amount of echogenic plaque within the left carotid bulb (image 50),
extending to involve the origin and proximal aspects of the left
internal carotid artery (image 58), progressed compared to the
[DATE] examination and results in elevated peak systolic velocities
throughout the interrogated course the left internal carotid artery
(greatest acquired peak systolic velocity with the proximal ICA
measures 217 cm/sec - image 61, previously 174 cm/sec).

LEFT VERTEBRAL ARTERY:  Antegrade flow
IMPRESSION: 1. Interval progression of now moderate to large amount of bilateral
atherosclerotic plaque, results in elevated peak systolic velocities
compatible with the higher end of the 50 - 69% luminal narrowing
range bilaterally, left greater than right. Further evaluation with
CTA could performed as clinically indicated.
2. Non visualization of a patent right vertebral artery. This
represents an interval change compared to the [DATE] examination.

## 2019-06-11 ENCOUNTER — Encounter: Payer: Self-pay | Admitting: Cardiovascular Disease

## 2019-06-11 ENCOUNTER — Ambulatory Visit: Payer: Medicare HMO | Admitting: Cardiovascular Disease

## 2019-06-11 VITALS — BP 144/60 | HR 56 | Temp 97.8°F | Ht 70.5 in | Wt 216.0 lb

## 2019-06-11 DIAGNOSIS — I6529 Occlusion and stenosis of unspecified carotid artery: Secondary | ICD-10-CM

## 2019-06-11 DIAGNOSIS — R001 Bradycardia, unspecified: Secondary | ICD-10-CM

## 2019-06-11 DIAGNOSIS — Z79899 Other long term (current) drug therapy: Secondary | ICD-10-CM

## 2019-06-11 DIAGNOSIS — I4821 Permanent atrial fibrillation: Secondary | ICD-10-CM

## 2019-06-11 DIAGNOSIS — Z7901 Long term (current) use of anticoagulants: Secondary | ICD-10-CM | POA: Diagnosis not present

## 2019-06-11 DIAGNOSIS — Z95828 Presence of other vascular implants and grafts: Secondary | ICD-10-CM | POA: Diagnosis not present

## 2019-06-11 DIAGNOSIS — I739 Peripheral vascular disease, unspecified: Secondary | ICD-10-CM

## 2019-06-11 DIAGNOSIS — I25708 Atherosclerosis of coronary artery bypass graft(s), unspecified, with other forms of angina pectoris: Secondary | ICD-10-CM

## 2019-06-11 DIAGNOSIS — R600 Localized edema: Secondary | ICD-10-CM | POA: Diagnosis not present

## 2019-06-11 DIAGNOSIS — I10 Essential (primary) hypertension: Secondary | ICD-10-CM | POA: Diagnosis not present

## 2019-06-11 MED ORDER — TORSEMIDE 20 MG PO TABS: 20.0000 mg | ORAL_TABLET | Freq: Once | ORAL | 3 refills | Status: DC

## 2019-06-11 MED ORDER — TORSEMIDE 20 MG PO TABS: 20.0000 mg | ORAL_TABLET | Freq: Every day | ORAL | 3 refills | Status: DC

## 2019-06-11 MED ORDER — POTASSIUM CHLORIDE CRYS ER 20 MEQ PO TBCR: 20.0000 meq | EXTENDED_RELEASE_TABLET | Freq: Every day | ORAL | 3 refills | Status: DC

## 2019-06-11 NOTE — Patient Instructions (Signed)
Medication Instructions:  START Torsemide 20 mg daily   START Potassium 20 meq daily   *If you need a refill on your cardiac medications before your next appointment, please call your pharmacy*   Lab Work: BMET in 1 week  If you have labs (blood work) drawn today and your tests are completely normal, you will receive your results only by: [name] MyChart Message (if you have MyChart) OR . A paper copy in the mail If you have any lab test that is abnormal or we need to change your treatment, we will call you to review the results.   Testing/Procedures: none   Follow-Up: At [facility], you and your health needs are our priority.  As part of our continuing mission to provide you with exceptional heart care, we have created designated Provider Care Teams.  These Care Teams include your primary Cardiologist (physician) and Advanced Practice Providers (APPs -  Physician Assistants and Nurse Practitioners) who all work together to provide you with the care you need, when you need it.  We recommend signing up for the patient portal called "MyChart".  Sign up information is provided on this After Visit Summary.  MyChart is used to connect with patients for Virtual Visits (Telemedicine).  Patients are able to view lab/test results, encounter notes, upcoming appointments, etc.  Non-urgent messages can be sent to your provider as well.   To learn more about what you can do with MyChart, go to [email or web address].    Your next appointment:   6 month(s)  The format for your next appointment:   In Person  Provider:   Kate Sable, MD   Other Instructions Weigh self daily, call us in 1 week and let us know how your swelling is.        Thank you for choosing [facility] !

## 2019-06-11 NOTE — Progress Notes (Signed)
SUBJECTIVE: The patient presents for routine follow-up. His wife, Mardene Celeste, is also my patient. He has a history of coronary artery disease and bypass graft surgery. Coronary angiography on 09/26/2012 demonstrated one out of 4 patent bypass grafts. He also has a history of atrial fibrillation , hypertension, dyslipidemia, carotid artery disease, and peripheral vascular disease.  Echocardiogram 06/21/15: Normal left ventricular systolic function and regional wall motion, LVEF 55-60%, mild LVH, mild mitral regurgitation, severe left atrial dilatation, mild to moderate right ventricular and mild right atrial enlargement, moderately elevated pulmonary pressures, 59 mmHg.  He underwent left femoral-popliteal bypass surgery on 04/12/06 and right femoral-popliteal bypass surgery on 07/19/99. The right-sided graft is known to be occluded.  He also has an infrarenal abdominal aortic aneurysm.  He has chronic venous insufficiency with bilateral leg swelling.  He wears compression stockings.  Chronic exertional dyspnea stable.  He denies chest pain.  He has chronic chest tightness which does not change with rest or with exertion.  He said his appetite is good.  His wife has had a lot of falls and receiving physical therapy at home.  He does all the cleaning around the house.  He is wearing compression stockings but continues to have bilateral leg edema.   Review of Systems: As per "subjective", otherwise negative.  Allergies  Allergen Reactions  . Niacin Itching and Rash    Burning sensation  . Iodine-131 Nausea And Vomiting  . Nsaids Other (See Comments)    Taking Coumadin    Current Outpatient Medications  Medication Sig Dispense Refill  . amLODipine (NORVASC) 10 MG tablet Take 10 mg by mouth daily.      [name] aspirin EC 81 MG EC tablet Take 1 tablet (81 mg total) by mouth daily. 30 tablet 3  . atorvastatin (LIPITOR) 40 MG tablet Take 1 tablet (40 mg total) by mouth daily. 90 tablet  1  . Isopropyl Alcohol (ALCOHOL PREP PADS EX)     . isosorbide mononitrate (IMDUR) 30 MG 24 hr tablet Take 1 tablet (30 mg total) by mouth daily. 90 tablet 1  . losartan (COZAAR) 100 MG tablet Take 100 mg by mouth daily.      . metFORMIN (GLUCOPHAGE) 1000 MG tablet Take 1 tablet (1,000 mg total) by mouth 2 (two) times daily with a meal.    . nitroGLYCERIN (NITROSTAT) 0.4 MG SL tablet Place 1 tablet (0.4 mg total) under the tongue every 5 (five) minutes x 3 doses as needed for chest pain. 25 tablet 3  . pantoprazole (PROTONIX) 40 MG tablet TAKE 1 TABLET EVERY DAY (Patient taking differently: TAKE 1 TABLET  EVERY DAY) 90 tablet 3  . tamsulosin (FLOMAX) 0.4 MG CAPS capsule     . tetrahydrozoline-zinc (VISINE-AC) 0.05-0.25 % ophthalmic solution 2 drops 3 (three) times daily as needed.    . warfarin (COUMADIN) 2 MG tablet Take 2 mg by mouth daily. MANAGED BY PMD    . ipratropium (ATROVENT) 0.03 % nasal spray Place 2 sprays into both nostrils in the morning and at bedtime.    . metoprolol tartrate (LOPRESSOR) 25 MG tablet Take 1 tablet by mouth daily.     No current facility-administered medications for this visit.    Past Medical History:  Diagnosis Date  . AAA (abdominal aortic aneurysm) ([place])    Followed by Dr. Curt Jews  . Arthritis   . CAD (coronary artery disease)   . Cancer [facility])    Prostate:  Radiation Tx  . Carotid  artery disease ([place])    Doppler 08/10/2010,  no change, RICA [id number], LICA [id number]  . Chest pain    precordial. mild chronic .[name]... nonischemic  . Coronary artery disease    a. Nuclear, January, 2008, no ischemia b. Cath 08/2012- 1/4 patent grafts, RCA CTO, no flow-limiting disease, medically managed  . Diabetes mellitus without complication ([place])   . Dizziness 02/2011  . Dyslipidemia   . Edema   . Ejection fraction    Normal LV, nuclear, 2008, ( no echo data as July 31, 2010)  . GERD (gastroesophageal reflux disease)    TAKES TUMS & ROLAIDS AS NEEDED  . Hx of CABG  2000  . Hypertension   . Myocardial infarction ([place]) 1999  . Neck pain 02/2011  . Pneumonia   . Renal artery stenosis ([facility])    50-70%  . S/P femoropopliteal bypass surgery    Dr. Donnetta Hutching  . Sinus bradycardia    Asymptomatic    Past Surgical History:  Procedure Laterality Date  . CARDIAC CATHETERIZATION  09/26/2012   1/4 patent bypass (occluded SVG-PDA, SVG-OM, LIMA-LAD), SVG-diagonal patent and fills the diagonal and LAD, distal RCA occlusion with left to right collateralization, patent circumflex, LAD with no flow-limiting disease and antegrade flow competitively from SVG-diagonal; EF 60-65%  . COLONOSCOPY  11/30/2009   [id number]. next TCS 11/2019  . CORONARY ARTERY BYPASS GRAFT  2000  . ESOPHAGOGASTRODUODENOSCOPY N/A 08/12/2013   [id number] peptic stricture with erosive refluxesophagitis - status post Maloney dilation. Hiatal hernia. Abnormalgastric mucosa. Deformity of the pyloric channel suggestive ofprior peptic ulcer disease. Duodenal bulbar diverticulum Statuspost gastric biopsy. h.pylori  . LEFT HEART CATHETERIZATION WITH CORONARY/GRAFT ANGIOGRAM N/A 09/26/2012   Procedure: LEFT HEART CATHETERIZATION WITH Beatrix Fetters;  Surgeon: Burnell Blanks, MD;  Location: [facility] CATH LAB;  Service: Cardiovascular;  Laterality: N/A;  . Venia Minks DILATION N/A 08/12/2013   Procedure: Venia Minks DILATION;  Surgeon: Daneil Dolin, MD;  Location: [facility] ENDO SUITE;  Service: Endoscopy;  Laterality: N/A;  . PR VEIN BYPASS GRAFT,AORTO-FEM-POP Right 07/19/1999  . PR VEIN BYPASS GRAFT,AORTO-FEM-POP Left 05/02/2006  . SAVORY DILATION N/A 08/12/2013   Procedure: SAVORY DILATION;  Surgeon: Daneil Dolin, MD;  Location: [facility] ENDO SUITE;  Service: Endoscopy;  Laterality: N/A;  . WRIST SURGERY     cyst removal    Social History   Socioeconomic History  . Marital status: Married    Spouse name: Not on file  . Number of children: Not on file  . Years of education: Not on file  .  Highest education level: Not on file  Occupational History  . Not on file  Tobacco Use  . Smoking status: Former Smoker    Quit date: 04/02/1998    Years since quitting: 21.2  . Smokeless tobacco: Never Used  Substance and Sexual Activity  . Alcohol use: Yes    Alcohol/week: 0.0 - 2.0 standard drinks    Comment: OCCASIONAL  . Drug use: Yes    Types: Methamphetamines  . Sexual activity: Not on file  Other Topics Concern  . Not on file  Social History Narrative  . Not on file   Social Determinants of Health   Financial Resource Strain:   . Difficulty of Paying Living Expenses:   Food Insecurity:   . Worried About Charity fundraiser in the Last Year:   . Arboriculturist in the Last Year:   Transportation Needs:   . Film/video editor (Medical):   [name] Lack of  Transportation (Non-Medical):   Physical Activity:   . Days of Exercise per Week:   . Minutes of Exercise per Session:   Stress:   . Feeling of Stress :   Social Connections:   . Frequency of Communication with Friends and Family:   . Frequency of Social Gatherings with Friends and Family:   . Attends Religious Services:   . Active Member of Clubs or Organizations:   . Attends Archivist Meetings:   [name] Marital Status:   Intimate Partner Violence:   . Fear of Current or Ex-Partner:   . Emotionally Abused:   [name] Physically Abused:   . Sexually Abused:      Vitals:   06/11/19 0839  BP: (!) 144/60  Pulse: (!) 56  Temp: 97.8 F (36.6 C)  SpO2: 96%  Height: 5' 10.5" (1.791 m)    Wt Readings from Last 3 Encounters:  12/01/18 228 lb (103.4 kg)  10/30/17 228 lb (103.4 kg)  08/20/17 222 lb (100.7 kg)     PHYSICAL EXAM General: NAD HEENT: Normal. Neck: No JVD, no thyromegaly. Lungs: Diminished sounds throughout, no crackles or wheezes. CV: Regular rate and irregular rhythm, normal S1/S2, no S3, no murmur.  Chronic bilateral lower extremity edema and wearing compression stockings.  No carotid  bruits. Abdomen: Soft, nontender, no distention.  Neurologic: Alert and oriented.  Psych: Normal affect. Skin: Normal. Musculoskeletal: No gross deformities.      Labs: Lab Results  Component Value Date/Time   K 4.2 12/11/2016 09:50 AM   BUN 16 12/11/2016 09:50 AM   CREATININE 0.75 12/11/2016 09:50 AM   ALT 20 09/09/2007 08:38 AM   TSH 0.531 09/24/2012 04:48 PM   HGB 13.8 09/27/2012 05:40 AM     Lipids: Lab Results  Component Value Date/Time   LDLCALC 68 09/25/2012 06:18 AM   CHOL 129 09/25/2012 06:18 AM   TRIG 152 (H) 09/25/2012 06:18 AM   HDL 31 (L) 09/25/2012 06:18 AM       ASSESSMENT AND PLAN:  1. CAD with CABG and 1/4 patent bypass grafts in 08/2012:Symptomatically stable.Continue ASA, statin,and Imdur. Beta-blockers previously led to bradycardia.  2. Permanentatrial fibrillation: Symptomatically stable.Continue therapy with warfarin.  No beta-blockers due to bradycardia.  3. Essential HTN: Mildly elevated.  This will need further monitoring as I am adding torsemide.  4. Bilateral carotid artery stenosis:Followed by vascular surgery.Continue ASA and statin.1 to 39% right and 40 to 59% left internal carotid artery stenosis inMay 2019. Followed by vascular surgery.  5. PVDwith bilateral femoral-popliteal bypass graft surgery as detailed above: Followed by vascular surgery.Continue aspirin and statin.  6. Bilateral leg edema/chronic venous insufficiency:Lasix did not make a difference. He wears compression stockings.  I will try torsemide 20 mg daily along with potassium chloride 20 mEq daily.  We will check a basic metabolic panel in 1 week.  I asked him to weigh himself daily and to call us back in a week to see if symptoms are improving.   Disposition: Follow up 6 months   Kate Sable, M.D., F.A.C.C.

## 2019-06-11 NOTE — Addendum Note (Signed)
Addended by: Barbarann Ehlers A on: 06/11/2019 03:42 PM   Modules accepted: Orders

## 2019-06-18 ENCOUNTER — Telehealth: Payer: Self-pay

## 2019-06-18 ENCOUNTER — Other Ambulatory Visit (HOSPITAL_COMMUNITY)
Admission: RE | Admit: 2019-06-18 | Discharge: 2019-06-18 | Disposition: A | Discharge status: Home / Self Care | Payer: Medicare HMO | Source: Ambulatory Visit | Attending: Cardiovascular Disease | Admitting: Cardiovascular Disease

## 2019-06-18 DIAGNOSIS — Z79899 Other long term (current) drug therapy: Secondary | ICD-10-CM | POA: Diagnosis not present

## 2019-06-18 LAB — BASIC METABOLIC PANEL
Anion gap: 12 (ref 5–15)
BUN: 43 mg/dL — ABNORMAL HIGH (ref 8–23)
CO2: 28 mmol/L (ref 22–32)
Calcium: 9.1 mg/dL (ref 8.9–10.3)
Chloride: 100 mmol/L (ref 98–111)
Creatinine, Ser: 1.47 mg/dL — ABNORMAL HIGH (ref 0.61–1.24)
GFR calc Af Amer: 53 mL/min — ABNORMAL LOW (ref >60)
GFR calc non Af Amer: 46 mL/min — ABNORMAL LOW (ref >60)
Glucose, Bld: 127 mg/dL — ABNORMAL HIGH (ref 70–99)
Potassium: 3.7 mmol/L (ref 3.5–5.1)
Sodium: 140 mmol/L (ref 135–145)

## 2019-06-18 MED ORDER — TORSEMIDE 20 MG PO TABS: ORAL_TABLET | ORAL | 0 refills | Status: DC

## 2019-06-18 MED ORDER — POTASSIUM CHLORIDE CRYS ER 20 MEQ PO TBCR: EXTENDED_RELEASE_TABLET | ORAL | 0 refills | Status: DC

## 2019-06-18 NOTE — Telephone Encounter (Signed)
I will forward to Dr Koneswaran 

## 2019-06-18 NOTE — Telephone Encounter (Signed)
Pt called to give current weights. Friday 06-12-19 210lbs Sat. 06-13-19 210lbs Sunday 06-14-19 200lbs Monday 06-15-19 196lbs Tuesday 06-16-19 196lbs Wed. 06-17-19 194lbs Thursday 06-18-19 193lbs  Pt states you may call if you need to speak with him [phone]  Thanks renee

## 2019-06-18 NOTE — Telephone Encounter (Signed)
-----   Message from Herminio Commons, MD sent at 06/18/2019  3:57 PM EDT ----- Would stop torsemide and potassium as renal function has worsened.

## 2019-06-18 NOTE — Telephone Encounter (Signed)
When he took torsemide and potassium daily, his renal function got worse although weights have gone down considerably.  I would use torsemide along with potassium as needed for 3 pound weight gain in 24 hours.  Have him continue to weigh daily.  Repeat a basic metabolic panel in 2 weeks.

## 2019-06-18 NOTE — Telephone Encounter (Signed)
I spoke with patient.he will stop torsemide and potassium.He will stop using salt at home and try Mrs.Deliah Boston

## 2019-06-18 NOTE — Telephone Encounter (Signed)
Pt will only take torsemide and potassium when he gains over 3 lbs in 24 hr period.I will mail the lab slip.

## 2019-06-26 ENCOUNTER — Telehealth: Payer: Self-pay

## 2019-06-26 DIAGNOSIS — Z7901 Long term (current) use of anticoagulants: Secondary | ICD-10-CM | POA: Diagnosis not present

## 2019-06-26 NOTE — Telephone Encounter (Signed)
Patient is not managed in our clinic. He needs to contact his primary care doctor.

## 2019-06-26 NOTE — Telephone Encounter (Signed)
Pt states his medication(Pt didn't mention which one) is causing him to pass blood.  Please call [phone]  Thanks renee

## 2019-06-26 NOTE — Telephone Encounter (Signed)
Patient reports blood in his urine and thinks its coming from his fluid pill. Advised patient that torsemide doesn't cause blood in urine although it will cause him to urinate more. Advised patient to contact his PCP about any bleeding problems he may be having since his PCP manages him warfarin. Verbalized understanding.

## 2019-07-01 DIAGNOSIS — E1122 Type 2 diabetes mellitus with diabetic chronic kidney disease: Secondary | ICD-10-CM | POA: Diagnosis not present

## 2019-07-01 DIAGNOSIS — N182 Chronic kidney disease, stage 2 (mild): Secondary | ICD-10-CM | POA: Diagnosis not present

## 2019-07-01 DIAGNOSIS — I4821 Permanent atrial fibrillation: Secondary | ICD-10-CM | POA: Diagnosis not present

## 2019-07-01 DIAGNOSIS — I129 Hypertensive chronic kidney disease with stage 1 through stage 4 chronic kidney disease, or unspecified chronic kidney disease: Secondary | ICD-10-CM | POA: Diagnosis not present

## 2019-07-02 ENCOUNTER — Other Ambulatory Visit (HOSPITAL_COMMUNITY)
Admission: RE | Admit: 2019-07-02 | Discharge: 2019-07-02 | Disposition: A | Discharge status: Home / Self Care | Payer: Medicare HMO | Source: Ambulatory Visit | Attending: Cardiovascular Disease | Admitting: Cardiovascular Disease

## 2019-07-02 ENCOUNTER — Other Ambulatory Visit: Payer: Self-pay

## 2019-07-02 DIAGNOSIS — Z79899 Other long term (current) drug therapy: Secondary | ICD-10-CM | POA: Insufficient documentation

## 2019-07-02 LAB — BASIC METABOLIC PANEL
Anion gap: 7 (ref 5–15)
BUN: 15 mg/dL (ref 8–23)
CO2: 24 mmol/L (ref 22–32)
Calcium: 8.9 mg/dL (ref 8.9–10.3)
Chloride: 109 mmol/L (ref 98–111)
Creatinine, Ser: 0.75 mg/dL (ref 0.61–1.24)
GFR calc Af Amer: >60 mL/min (ref >60)
GFR calc non Af Amer: >60 mL/min (ref >60)
Glucose, Bld: 108 mg/dL — ABNORMAL HIGH (ref 70–99)
Potassium: 4.2 mmol/L (ref 3.5–5.1)
Sodium: 140 mmol/L (ref 135–145)

## 2019-07-03 ENCOUNTER — Telehealth: Payer: Self-pay

## 2019-07-03 MED ORDER — POTASSIUM CHLORIDE CRYS ER 20 MEQ PO TBCR: EXTENDED_RELEASE_TABLET | ORAL | 0 refills | Status: DC

## 2019-07-03 MED ORDER — TORSEMIDE 20 MG PO TABS: ORAL_TABLET | ORAL | 0 refills | Status: DC

## 2019-07-03 NOTE — Telephone Encounter (Addendum)
-----   Message from Herminio Commons, MD sent at 07/03/2019 10:19 AM EDT ----- Regarding: RE: resume demadex and K+ ? When he took torsemide and potassium daily, his renal function got worse.  I would use torsemide along with potassium as needed for 3 pound weight gain in 24 hours.  Have him continue to weigh daily.  ----- Message ----- From: Bernita Raisin, RN Sent: 07/03/2019   8:55 AM EDT To: Herminio Commons, MD Subject: resume demadex and K+ ?                        Pt now has normal renal function, asks id he can resume demadex and potassium.   I spoke with patient, he will now only take torsemide when he has 3 lbs weight gain in 24 hrs

## 2019-07-10 DIAGNOSIS — E119 Type 2 diabetes mellitus without complications: Secondary | ICD-10-CM | POA: Diagnosis not present

## 2019-07-10 DIAGNOSIS — E6609 Other obesity due to excess calories: Secondary | ICD-10-CM | POA: Diagnosis not present

## 2019-07-10 DIAGNOSIS — Z6832 Body mass index (BMI) 32.0-32.9, adult: Secondary | ICD-10-CM | POA: Diagnosis not present

## 2019-07-31 DIAGNOSIS — I25708 Atherosclerosis of coronary artery bypass graft(s), unspecified, with other forms of angina pectoris: Secondary | ICD-10-CM | POA: Diagnosis not present

## 2019-07-31 DIAGNOSIS — E1122 Type 2 diabetes mellitus with diabetic chronic kidney disease: Secondary | ICD-10-CM | POA: Diagnosis not present

## 2019-07-31 DIAGNOSIS — I129 Hypertensive chronic kidney disease with stage 1 through stage 4 chronic kidney disease, or unspecified chronic kidney disease: Secondary | ICD-10-CM | POA: Diagnosis not present

## 2019-07-31 DIAGNOSIS — N182 Chronic kidney disease, stage 2 (mild): Secondary | ICD-10-CM | POA: Diagnosis not present

## 2019-08-05 DIAGNOSIS — E6609 Other obesity due to excess calories: Secondary | ICD-10-CM | POA: Diagnosis not present

## 2019-08-05 DIAGNOSIS — G8929 Other chronic pain: Secondary | ICD-10-CM | POA: Diagnosis not present

## 2019-08-05 DIAGNOSIS — Z6833 Body mass index (BMI) 33.0-33.9, adult: Secondary | ICD-10-CM | POA: Diagnosis not present

## 2019-09-03 DIAGNOSIS — E6609 Other obesity due to excess calories: Secondary | ICD-10-CM | POA: Diagnosis not present

## 2019-09-03 DIAGNOSIS — Z6831 Body mass index (BMI) 31.0-31.9, adult: Secondary | ICD-10-CM | POA: Diagnosis not present

## 2019-09-03 DIAGNOSIS — Z7901 Long term (current) use of anticoagulants: Secondary | ICD-10-CM | POA: Diagnosis not present

## 2019-09-03 DIAGNOSIS — M5136 Other intervertebral disc degeneration, lumbar region: Secondary | ICD-10-CM | POA: Diagnosis not present

## 2019-09-03 DIAGNOSIS — M545 Low back pain: Secondary | ICD-10-CM | POA: Diagnosis not present

## 2019-09-30 DIAGNOSIS — I25708 Atherosclerosis of coronary artery bypass graft(s), unspecified, with other forms of angina pectoris: Secondary | ICD-10-CM | POA: Diagnosis not present

## 2019-09-30 DIAGNOSIS — I129 Hypertensive chronic kidney disease with stage 1 through stage 4 chronic kidney disease, or unspecified chronic kidney disease: Secondary | ICD-10-CM | POA: Diagnosis not present

## 2019-09-30 DIAGNOSIS — E1122 Type 2 diabetes mellitus with diabetic chronic kidney disease: Secondary | ICD-10-CM | POA: Diagnosis not present

## 2019-09-30 DIAGNOSIS — N182 Chronic kidney disease, stage 2 (mild): Secondary | ICD-10-CM | POA: Diagnosis not present

## 2019-10-07 DIAGNOSIS — Z7901 Long term (current) use of anticoagulants: Secondary | ICD-10-CM | POA: Diagnosis not present

## 2019-10-12 ENCOUNTER — Other Ambulatory Visit: Payer: Self-pay | Admitting: *Deleted

## 2019-10-12 DIAGNOSIS — I6529 Occlusion and stenosis of unspecified carotid artery: Secondary | ICD-10-CM

## 2019-10-12 DIAGNOSIS — I739 Peripheral vascular disease, unspecified: Secondary | ICD-10-CM

## 2019-10-12 DIAGNOSIS — Z95828 Presence of other vascular implants and grafts: Secondary | ICD-10-CM

## 2019-10-12 DIAGNOSIS — I7092 Chronic total occlusion of artery of the extremities: Secondary | ICD-10-CM

## 2019-10-12 DIAGNOSIS — I714 Abdominal aortic aneurysm, without rupture, unspecified: Secondary | ICD-10-CM

## 2019-10-19 ENCOUNTER — Other Ambulatory Visit: Payer: Self-pay

## 2019-10-19 ENCOUNTER — Ambulatory Visit (HOSPITAL_COMMUNITY)
Admission: RE | Admit: 2019-10-19 | Discharge: 2019-10-19 | Disposition: A | Discharge status: Home / Self Care | Payer: Medicare HMO | Source: Ambulatory Visit | Attending: Surgery | Admitting: Surgery

## 2019-10-19 ENCOUNTER — Ambulatory Visit (INDEPENDENT_AMBULATORY_CARE_PROVIDER_SITE_OTHER)
Admission: RE | Admit: 2019-10-19 | Discharge: 2019-10-19 | Disposition: A | Discharge status: Home / Self Care | Payer: Medicare HMO | Source: Ambulatory Visit | Attending: Surgery | Admitting: Surgery

## 2019-10-19 DIAGNOSIS — I739 Peripheral vascular disease, unspecified: Secondary | ICD-10-CM

## 2019-10-19 DIAGNOSIS — I714 Abdominal aortic aneurysm, without rupture, unspecified: Secondary | ICD-10-CM

## 2019-10-19 DIAGNOSIS — I7092 Chronic total occlusion of artery of the extremities: Secondary | ICD-10-CM | POA: Insufficient documentation

## 2019-10-19 DIAGNOSIS — I6529 Occlusion and stenosis of unspecified carotid artery: Secondary | ICD-10-CM | POA: Insufficient documentation

## 2019-10-19 DIAGNOSIS — I77811 Abdominal aortic ectasia: Secondary | ICD-10-CM | POA: Diagnosis not present

## 2019-10-19 DIAGNOSIS — Z95828 Presence of other vascular implants and grafts: Secondary | ICD-10-CM | POA: Insufficient documentation

## 2019-10-20 ENCOUNTER — Ambulatory Visit: Payer: Medicare HMO | Admitting: Vascular Surgery

## 2019-10-27 ENCOUNTER — Ambulatory Visit: Payer: Medicare HMO | Admitting: Vascular Surgery

## 2019-10-27 ENCOUNTER — Other Ambulatory Visit: Payer: Self-pay

## 2019-10-27 ENCOUNTER — Encounter: Payer: Self-pay | Admitting: Vascular Surgery

## 2019-10-27 VITALS — BP 148/69 | HR 55 | Temp 97.5°F | Resp 18 | Ht 69.0 in | Wt 207.7 lb

## 2019-10-27 DIAGNOSIS — I714 Abdominal aortic aneurysm, without rupture, unspecified: Secondary | ICD-10-CM

## 2019-10-27 DIAGNOSIS — I739 Peripheral vascular disease, unspecified: Secondary | ICD-10-CM | POA: Diagnosis not present

## 2019-10-27 DIAGNOSIS — Z95828 Presence of other vascular implants and grafts: Secondary | ICD-10-CM | POA: Diagnosis not present

## 2019-10-27 DIAGNOSIS — I6529 Occlusion and stenosis of unspecified carotid artery: Secondary | ICD-10-CM

## 2019-10-27 NOTE — Progress Notes (Signed)
[facility] [place]  Patient name: Troy [name] MRN: [id number] DOB: 01-21-1941 Sex: male  REASON FOR VISIT: Follow-up diffuse peripheral vascular occlusive disease  HPI: Troy [name] is a 79 y.o. male here today for follow-up.  He is having difficulty regarding esophageal stricture requiring dilatation.  He has a severe reflux and this is also affected his swallowing ability.  He also has vocal cord paralysis with hoarse voice.  He denies any claudication symptoms.  He does have severe degenerative disc disease and this limits his walking ability.  He has had no neurologic deficits related to carotid disease and no symptoms related to his aortic aneurysm  Past Medical History:  Diagnosis Date  . AAA (abdominal aortic aneurysm) ([place])    Followed by Dr. Curt Jews  . Arthritis   . CAD (coronary artery disease)   . Cancer [facility])    Prostate:  Radiation Tx  . Carotid artery disease ([place])    Doppler 08/10/2010,  no change, RICA 6-81%, LICA 15-72%  . Chest pain    precordial. mild chronic .[name]... nonischemic  . Coronary artery disease    a. Nuclear, January, 2008, no ischemia b. Cath 08/2012- 1/4 patent grafts, RCA CTO, no flow-limiting disease, medically managed  . Diabetes mellitus without complication ([place])   . Dizziness 02/2011  . Dyslipidemia   . Edema   . Ejection fraction    Normal LV, nuclear, 2008, ( no echo data as July 31, 2010)  . GERD (gastroesophageal reflux disease)    TAKES TUMS & ROLAIDS AS NEEDED  . Hx of CABG 2000  . Hypertension   . Myocardial infarction ([place]) 1999  . Neck pain 02/2011  . Pneumonia   . Renal artery stenosis ([facility])    50-70%  . S/P femoropopliteal bypass surgery    Dr. Donnetta Hutching  . Sinus bradycardia    Asymptomatic    Family History  Problem Relation Age of Onset  . Deep vein thrombosis Father   . Lung cancer Sister   . Diabetes Sister   . Heart disease Sister        After age 35   . Hyperlipidemia Sister   . Hypertension Sister   . Lung cancer Sister   . Breast cancer Sister   . Hypertension Mother   . Diabetes Sister   . Coronary artery disease Unknown        family hx of  . Cancer Brother        "crab cancer"  . Heart disease Brother   . Heart attack Brother   . Heart attack Daughter   . Colon cancer Neg Hx   . Stroke Neg Hx     SOCIAL HISTORY: Social History   Tobacco Use  . Smoking status: Former Smoker    Quit date: 04/02/1998    Years since quitting: 21.5  . Smokeless tobacco: Never Used  Substance Use Topics  . Alcohol use: Yes    Alcohol/week: 0.0 - 2.0 standard drinks    Comment: OCCASIONAL    Allergies  Allergen Reactions  . Niacin Itching and Rash    Burning sensation  . Iodine-131 Nausea And Vomiting  . Nsaids Other (See Comments)    Taking Coumadin    Current Outpatient Medications  Medication Sig Dispense Refill  . amLODipine (NORVASC) 10 MG tablet Take 10 mg by mouth daily.      [name] aspirin EC 81 MG EC tablet Take 1 tablet (81 mg total) by  mouth daily. 30 tablet 3  . atorvastatin (LIPITOR) 40 MG tablet Take 1 tablet (40 mg total) by mouth daily. 90 tablet 1  . ipratropium (ATROVENT) 0.03 % nasal spray Place 2 sprays into both nostrils in the morning and at bedtime.    . Isopropyl Alcohol (ALCOHOL PREP PADS EX)     . isosorbide mononitrate (IMDUR) 30 MG 24 hr tablet Take 1 tablet (30 mg total) by mouth daily. 90 tablet 1  . losartan (COZAAR) 100 MG tablet Take 100 mg by mouth daily.      . metFORMIN (GLUCOPHAGE) 1000 MG tablet Take 1 tablet (1,000 mg total) by mouth 2 (two) times daily with a meal.    . metoprolol tartrate (LOPRESSOR) 25 MG tablet Take 1 tablet by mouth daily.    . nitroGLYCERIN (NITROSTAT) 0.4 MG SL tablet Place 1 tablet (0.4 mg total) under the tongue every 5 (five) minutes x 3 doses as needed for chest pain. 25 tablet 3  . pantoprazole (PROTONIX) 40 MG tablet TAKE 1 TABLET EVERY DAY (Patient taking  differently: TAKE 1 TABLET  EVERY DAY) 90 tablet 3  . potassium chloride SA (KLOR-CON) 20 MEQ tablet Take on days you need to take demadex 60 tablet 0  . tamsulosin (FLOMAX) 0.4 MG CAPS capsule     . tetrahydrozoline-zinc (VISINE-AC) 0.05-0.25 % ophthalmic solution 2 drops 3 (three) times daily as needed.    . torsemide (DEMADEX) 20 MG tablet Take 20 mg for weight gain over 3 lbs in 24 hours 60 tablet 0  . warfarin (COUMADIN) 2 MG tablet Take 2 mg by mouth daily. MANAGED BY PMD     No current facility-administered medications for this visit.    REVIEW OF SYSTEMS:  [X]  denotes positive finding, [ ]  denotes negative finding Cardiac  Comments:  Chest pain or chest pressure:    Shortness of breath upon exertion: x   Short of breath when lying flat:    Irregular heart rhythm:        Vascular    Pain in calf, thigh, or hip brought on by ambulation:    Pain in feet at night that wakes you up from your sleep:     Blood clot in your veins:    Leg swelling:           PHYSICAL EXAM: Vitals:   10/27/19 1438 10/27/19 1442  BP: (!) 145/70 (!) 148/69  Pulse: 55   Resp: 18   Temp: (!) 97.5 F (36.4 C)   TempSrc: Temporal   SpO2: 95%   Weight: (!) 207 lb 11.2 oz (94.2 kg)   Height: 5\' 9"  (1.753 m)     GENERAL: The patient is a well-nourished male, in no acute distress. The vital signs are documented above. CARDIOVASCULAR: Carotid arteries without bruits bilaterally.  2+ radial pulses bilaterally.  I do not palpate an aneurysm with moderate abdominal obesity.  He does have palpable dorsalis pedis pulses bilaterally.  2+ on the left and 1+ on the right. PULMONARY: There is good air exchange  MUSCULOSKELETAL: There are no major deformities or cyanosis. NEUROLOGIC: No focal weakness or paresthesias are detected. SKIN: There are no ulcers or rashes noted. PSYCHIATRIC: The patient has a normal affect.  DATA:  Carotid duplex today revealed no change with 40 to 59% left internal carotid  stenosis and 1 to 39% right carotid stenosis  Aneurysm ultrasound today shows infrarenal aneurysm at 4.7 cm.  This was 4.3 in May 2019  Ankle arm index  is normal bilaterally.  Duplex imaging reveals patency of his left femoral to popliteal and known chronic occlusion of his right femoral to popliteal bypass  MEDICAL ISSUES: Stable overall from his diffuse peripheral vascular occlusive disease.  We will see him again in 1 year for repeat duplex of his aortic aneurysm and also carotid follow-up.  He is stable with known right femoral to popliteal [place] occlusion and actually has a palpable dorsalis pedis pulse from collaterals.  He will notify should he develop any healing issues.  Otherwise we will see him again in 1 year    Rosetta Posner, MD [facility] Tel [phone] Pager [phone]

## 2019-10-29 ENCOUNTER — Encounter (INDEPENDENT_AMBULATORY_CARE_PROVIDER_SITE_OTHER): Payer: Self-pay | Admitting: *Deleted

## 2019-11-17 DIAGNOSIS — Z7901 Long term (current) use of anticoagulants: Secondary | ICD-10-CM | POA: Diagnosis not present

## 2019-11-19 ENCOUNTER — Encounter (INDEPENDENT_AMBULATORY_CARE_PROVIDER_SITE_OTHER): Payer: Self-pay | Admitting: *Deleted

## 2019-11-19 ENCOUNTER — Telehealth (INDEPENDENT_AMBULATORY_CARE_PROVIDER_SITE_OTHER): Payer: Self-pay | Admitting: *Deleted

## 2019-11-19 ENCOUNTER — Other Ambulatory Visit (INDEPENDENT_AMBULATORY_CARE_PROVIDER_SITE_OTHER): Payer: Self-pay | Admitting: *Deleted

## 2019-11-19 NOTE — Telephone Encounter (Addendum)
Referring MD/PCP: Bridget Hartshorn   Procedure: tcs  Reason/Indication:  screening  Has patient had this procedure before?  Yes, 9-10 yrs ago  If so, when, by whom and where?    Is there a family history of colon cancer?  no  Who?  What age when diagnosed?    Is patient diabetic?   yes      Does patient have prosthetic heart valve or mechanical valve?  no  Do you have a pacemaker/defibrillator?  no  Has patient ever had endocarditis/atrial fibrillation? no  Does patient use oxygen? no  Has patient had joint replacement within last 12 months?  no  Is patient constipated or do they take laxatives? no  Does patient have a history of alcohol/drug use?  no  Is patient on blood thinner such as Coumadin, Plavix and/or Aspirin? yes  Medications: amlodipine 10 mg daily, asa 81 mg daily, atorvastatin 40 mg daily, fexofendadine 180 mg daily, furosemide 40 mg daily, hydrocodone/acet 5/325 mg every 6 hrs, nasal spray, isosorbide 30 mg daily, losartan 100 mg daily, metformin 1000 mg bid, metoprolol 25 mg bid, nitroglycerin prn, pantoprazole 40 mg daily, tamsulosin 0.4 mg daily, warfarin 2 mg daily  Allergies: see epic  Medication Adjustment per Dr Rehman/Dr Jenetta Downer warfarin 5 days, hold metformin evening before and morning of  Procedure date & time: 12/18/19

## 2019-11-19 NOTE — Telephone Encounter (Signed)
Patient needs trilyte 

## 2019-11-19 NOTE — Telephone Encounter (Signed)
Ok to schedule in room 3. ? ?Thanks, ? ?Troy Hollenberg Castaneda Mayorga, MD ?Gastroenterology and Hepatology ?[facility] ? ?

## 2019-11-20 ENCOUNTER — Other Ambulatory Visit (INDEPENDENT_AMBULATORY_CARE_PROVIDER_SITE_OTHER): Payer: Self-pay | Admitting: *Deleted

## 2019-11-20 DIAGNOSIS — Z1211 Encounter for screening for malignant neoplasm of colon: Secondary | ICD-10-CM

## 2019-11-20 MED ORDER — PEG 3350-KCL-NA BICARB-NACL 420 G PO SOLR: 4000.0000 mL | Freq: Once | ORAL | 0 refills | Status: AC

## 2019-11-23 ENCOUNTER — Telehealth (INDEPENDENT_AMBULATORY_CARE_PROVIDER_SITE_OTHER): Payer: Self-pay | Admitting: *Deleted

## 2019-11-23 DIAGNOSIS — I4891 Unspecified atrial fibrillation: Secondary | ICD-10-CM

## 2019-11-23 NOTE — Telephone Encounter (Signed)
Patient is shc'd for screening TCS 12/18/19 - I contacted Dr Hilma Favors ([place]) to get an ok for patient to stop Warafin 5 days prior - his nurse called back and left message that he was leaving that up to you and patient's stroke risk would go up if stopped - I've never had a physician say he was leaving up to our providers so not sure what to do - please advise

## 2019-11-25 MED ORDER — ENOXAPARIN SODIUM 100 MG/ML ~~LOC~~ SOLN: 90.0000 mg | Freq: Two times a day (BID) | SUBCUTANEOUS | 0 refills | Status: DC

## 2019-11-25 NOTE — Telephone Encounter (Signed)
Spoke with the patient about the need to bridge him with Lovenox 1 mg/kg after he stops his coumadin, 5 days before of the procedure. I will send a script for Lovenox and he will administer it at home. The patient will go to his PCP to learn how to use the SQ injections. He understood and agreed.  Troy Peppers, MD Gastroenterology and Hepatology [facility]

## 2019-11-30 ENCOUNTER — Telehealth (INDEPENDENT_AMBULATORY_CARE_PROVIDER_SITE_OTHER): Payer: Self-pay | Admitting: *Deleted

## 2019-11-30 NOTE — Telephone Encounter (Signed)
Patient wants to discuss the lovenox injections - please call [phone]

## 2019-12-04 NOTE — Telephone Encounter (Signed)
Spoke with patient regarding his stroke risk if he does not take the Lovenox bridge. I explained to him the importance of taking the medication while off the Coumadin.  The patient understood and agreed.  Maylon Peppers, MD Gastroenterology and Hepatology [facility]

## 2019-12-08 ENCOUNTER — Encounter: Payer: Self-pay | Admitting: Internal Medicine

## 2019-12-10 NOTE — Patient Instructions (Signed)
Troy [name]  12/10/2019     @PREFPERIOPPHARMACY @   Your procedure is scheduled on  12/18/2019.  Report to [facility] at  0900  A.M.  Call this number if you have problems the morning of surgery:  [phone]   Remember:/  Follow the /diet and prep instructions given to you by the office.                    Take these medicines the morning of surgery with A SIP OF WATER  Amlodipine, isosorbide. DO NOT take any medications for diabetes the morning of your procedure. Your last dose of coumadin should be on 12/12/2019 and your should start your lovenox bridge 12/13/2019.    Do not wear jewelry, make-up or nail polish.  Do not wear lotions, powders, or perfumes. Please wear deodorant and brush your teeth.  Do not shave 48 hours prior to surgery.  Men may shave face and neck.  Do not bring valuables to the hospital.  [facility] is not responsible for any belongings or valuables.  Contacts, dentures or bridgework may not be worn into surgery.  Leave your suitcase in the car.  After surgery it may be brought to your room.  For patients admitted to the hospital, discharge time will be determined by your treatment team.  Patients discharged the day of surgery will not be allowed to drive home.   Name and phone number of your driver:   family Special instructions:  DO NOT smoke the morning of your procedure.  Please read over the following fact sheets that you were given. Anesthesia Post-op Instructions and Care and Recovery After Surgery       Colonoscopy, Adult, Care After This sheet gives you information about how to care for yourself after your procedure. Your health care provider may also give you more specific instructions. If you have problems or questions, contact your health care provider. What can I expect after the procedure? After the procedure, it is common to have:  A small amount of blood in your stool for 24 hours after the procedure.  Some gas.  Mild  cramping or bloating of your abdomen. Follow these instructions at home: Eating and drinking   Drink enough fluid to keep your urine pale yellow.  Follow instructions from your health care provider about eating or drinking restrictions.  Resume your normal diet as instructed by your health care provider. Avoid heavy or fried foods that are hard to digest. Activity  Rest as told by your health care provider.  Avoid sitting for a long time without moving. Get up to take short walks every 1-2 hours. This is important to improve blood flow and breathing. Ask for help if you feel weak or unsteady.  Return to your normal activities as told by your health care provider. Ask your health care provider what activities are safe for you. Managing cramping and bloating   Try walking around when you have cramps or feel bloated.  Apply heat to your abdomen as told by your health care provider. Use the heat source that your health care provider recommends, such as a moist heat pack or a heating pad. ? Place a towel between your skin and the heat source. ? Leave the heat on for 20-30 minutes. ? Remove the heat if your skin turns bright red. This is especially important if you are unable to feel pain, heat, or cold. You may have a  greater risk of getting burned. General instructions  For the first 24 hours after the procedure: ? Do not drive or use machinery. ? Do not sign important documents. ? Do not drink alcohol. ? Do your regular daily activities at a slower pace than normal. ? Eat soft foods that are easy to digest.  Take over-the-counter and prescription medicines only as told by your health care provider.  Keep all follow-up visits as told by your health care provider. This is important. Contact a health care provider if:  You have blood in your stool 2-3 days after the procedure. Get help right away if you have:  More than a small spotting of blood in your stool.  Large blood  clots in your stool.  Swelling of your abdomen.  Nausea or vomiting.  A fever.  Increasing pain in your abdomen that is not relieved with medicine. Summary  After the procedure, it is common to have a small amount of blood in your stool. You may also have mild cramping and bloating of your abdomen.  For the first 24 hours after the procedure, do not drive or use machinery, sign important documents, or drink alcohol.  Get help right away if you have a lot of blood in your stool, nausea or vomiting, a fever, or increased pain in your abdomen. This information is not intended to replace advice given to you by your health care provider. Make sure you discuss any questions you have with your health care provider. Document Revised: 10/13/2018 Document Reviewed: 10/13/2018 Elsevier Patient Education  [place] After These instructions provide you with information about caring for yourself after your procedure. Your health care provider may also give you more specific instructions. Your treatment has been planned according to current medical practices, but problems sometimes occur. Call your health care provider if you have any problems or questions after your procedure. What can I expect after the procedure? After your procedure, you may:  Feel sleepy for several hours.  Feel clumsy and have poor balance for several hours.  Feel forgetful about what happened after the procedure.  Have poor judgment for several hours.  Feel nauseous or vomit.  Have a sore throat if you had a breathing tube during the procedure. Follow these instructions at home: For at least 24 hours after the procedure:      Have a responsible adult stay with you. It is important to have someone help care for you until you are awake and alert.  Rest as needed.  Do not: ? Participate in activities in which you could fall or become injured. ? Drive. ? Use heavy  machinery. ? Drink alcohol. ? Take sleeping pills or medicines that cause drowsiness. ? Make important decisions or sign legal documents. ? Take care of children on your own. Eating and drinking  Follow the diet that is recommended by your health care provider.  If you vomit, drink water, juice, or soup when you can drink without vomiting.  Make sure you have little or no nausea before eating solid foods. General instructions  Take over-the-counter and prescription medicines only as told by your health care provider.  If you have sleep apnea, surgery and certain medicines can increase your risk for breathing problems. Follow instructions from your health care provider about wearing your sleep device: ? Anytime you are sleeping, including during daytime naps. ? While taking prescription pain medicines, sleeping medicines, or medicines that make you drowsy.  If you  smoke, do not smoke without supervision.  Keep all follow-up visits as told by your health care provider. This is important. Contact a health care provider if:  You keep feeling nauseous or you keep vomiting.  You feel light-headed.  You develop a rash.  You have a fever. Get help right away if:  You have trouble breathing. Summary  For several hours after your procedure, you may feel sleepy and have poor judgment.  Have a responsible adult stay with you for at least 24 hours or until you are awake and alert. This information is not intended to replace advice given to you by your health care provider. Make sure you discuss any questions you have with your health care provider. Document Revised: 06/17/2017 Document Reviewed: 07/10/2015 Elsevier Patient Education  [place].

## 2019-12-11 ENCOUNTER — Encounter: Payer: Self-pay | Admitting: Physician Assistant

## 2019-12-11 NOTE — Progress Notes (Signed)
Cardiology Office Note    Date:  12/14/2019   ID:  [name], DOB [birth year], MRN [id number]  PCP:  Sharilyn Sites, MD  Cardiologist:  Kate Sable, MD (Inactive)  Electrophysiologist:  None   Chief Complaint: f/u CAD, atrial fibrillation, edema  History of Present Illness:   Troy [name] is a 79 y.o. male with history of CAD s/p CABG 2000, permanent atrial fibrillation, RBBB, pulmonary HTN and right heart dilation by echo 2017, HTN, dyslipidemia, carotid artery disease, PVD with prior fem-pop bypass grafting, infrarenal abdominal aortic aneurysm (4.7cm 10/2019), prior renal artery stenosis, chronic venous insuffiency with edema, DM, sinus bradycardia, prostate CA, and esophageal stricture requiring dilatation who presents for routine follow-up. His last cath was in 2014 showing occluded SVG-PDA, SVG-OM, atretic LIMA-LAD, and patent SVG-diagonal, LVEF 60-65%. 2D echo 06/2015 mild LVH, EF 55-60%, mild MR, severe LAE, mild-moderate RV dilation, mild RAE, pulmonary artery systolic pressure moderately increased at 65mmHg.   He presents for routine follow-up today overall feeling well. It is my first visit with him. He reports chronic vague chest tightness "ever since I was operated on." This is corroborated in the prior notes and he denies any significant change to this. It is not specifically provoked by activity. He has chronic dyspnea and is relatively sedentary. He is not quite sure if this has changed in any way. He manages his chronic edema, which waxes and wanes, with PRN torsemide. In the past when he used it regularly it caused renal dysfunction so now he uses it sparingly. He is currently on a Coumadin to Lovenox bridge by his PCP for an upcoming colonoscopy. He reports his INR is managed by Dr. Hilma Favors.  Labwork independently reviewed: 07/2019 K 4.2, Cr 0.75 2018 Hgb 12.5   Past Medical History:  Diagnosis Date  . AAA (abdominal aortic aneurysm) ([place])    Followed by Dr. Curt Jews  . Arthritis   . CAD (coronary artery disease)    a. CABG 2000.  [name] Cancer [facility])    Prostate:  Radiation Tx  . Carotid artery disease ([place])   . Chest pain    precordial. mild chronic .[name]... nonischemic  . Chronic edema   . Coronary artery disease    a. Nuclear, January, 2008, no ischemia b. Cath 08/2012- 1/4 patent grafts, RCA CTO, no flow-limiting disease, medically managed  . Diabetes mellitus without complication ([place])   . Dizziness 02/2011  . Dyslipidemia   . GERD (gastroesophageal reflux disease)    TAKES TUMS & ROLAIDS AS NEEDED  . Hx of CABG 2000  . Hypertension   . Myocardial infarction ([place]) 1999  . Neck pain 02/2011  . Pneumonia   . Pulmonary hypertension ([place])   . Renal artery stenosis ([facility])    50-70%  . S/P femoropopliteal bypass surgery    Dr. Donnetta Hutching  . Sinus bradycardia    Asymptomatic    Past Surgical History:  Procedure Laterality Date  . CARDIAC CATHETERIZATION  09/26/2012   1/4 patent bypass (occluded SVG-PDA, SVG-OM, LIMA-LAD), SVG-diagonal patent and fills the diagonal and LAD, distal RCA occlusion with left to right collateralization, patent circumflex, LAD with no flow-limiting disease and antegrade flow competitively from SVG-diagonal; EF 60-65%  . COLONOSCOPY  11/30/2009   MVH:[id number]. next TCS 11/2019  . CORONARY ARTERY BYPASS GRAFT  2000  . ESOPHAGOGASTRODUODENOSCOPY N/A 08/12/2013   GMW:[id number] peptic stricture with erosive refluxesophagitis - status post Maloney dilation. Hiatal hernia. Abnormalgastric mucosa. Deformity of the pyloric channel suggestive ofprior  peptic ulcer disease. Duodenal bulbar diverticulum Statuspost gastric biopsy. h.pylori  . LEFT HEART CATHETERIZATION WITH CORONARY/GRAFT ANGIOGRAM N/A 09/26/2012   Procedure: LEFT HEART CATHETERIZATION WITH Beatrix Fetters;  Surgeon: Burnell Blanks, MD;  Location: [facility] CATH LAB;  Service: Cardiovascular;  Laterality: N/A;  . Venia Minks DILATION N/A 08/12/2013    Procedure: Venia Minks DILATION;  Surgeon: Daneil Dolin, MD;  Location[facility] ENDO SU[facility];  Service: Endoscopy;  Laterality: N/A;  . PR VEIN BYPASS GRAFT,AORTO-FEM-POP Right 07/19/1999  . PR VEIN BYPASS GRAFT,AORTO-FEM-POP Left 05/02/2006  . SAVORY DILATION N/A 08/12/2013   Procedure: SAVORY DILATION;  Surgeon: Daneil Dolin, MD;  Location: [facility] ENDO SUITE;  Service: Endoscopy;  Laterality: N/A;  . WRIST SURGERY     cyst removal    Current Medications: Current Meds  Medication Sig  . amLODipine (NORVASC) 10 MG tablet Take 10 mg by mouth every evening.   [name] aspirin EC 81 MG EC tablet Take 1 tablet (81 mg total) by mouth daily. (Patient taking differently: Take 81 mg by mouth every evening. )  . atorvastatin (LIPITOR) 40 MG tablet Take 1 tablet (40 mg total) by mouth daily. (Patient taking differently: Take 40 mg by mouth every evening. )  . enoxaparin (LOVENOX) 100 MG/ML injection Inject 0.9 mLs (90 mg total) into the skin every 12 (twelve) hours for 7 days. Start on 9/13 (last dose of warfarin on 9/12) but DO NOT take in the morning of 9/17  . HYDROcodone-acetaminophen (NORCO/VICODIN) 5-325 MG tablet Take 1 tablet by mouth daily as needed for moderate pain.  [name] ipratropium (ATROVENT) 0.03 % nasal spray Place 2 sprays into both nostrils in the morning and at bedtime.  . Isopropyl Alcohol (ALCOHOL PREP PADS EX)   . isosorbide mononitrate (IMDUR) 60 MG 24 hr tablet Take 60 mg by mouth every evening.   [name] losartan (COZAAR) 100 MG tablet Take 100 mg by mouth daily.    . metFORMIN (GLUCOPHAGE) 1000 MG tablet Take 1 tablet (1,000 mg total) by mouth 2 (two) times daily with a meal. (Patient taking differently: Take 1,000 mg by mouth daily with breakfast. )  . metoprolol tartrate (LOPRESSOR) 25 MG tablet Take 12.5 mg by mouth every evening.  Pt reports taking one whole tablet daily  . nitroGLYCERIN (NITROSTAT) 0.4 MG SL tablet Place 1 tablet (0.4 mg total) under the tongue every 5 (five) minutes x 3 doses as  needed for chest pain.  . pantoprazole (PROTONIX) 40 MG tablet TAKE 1 TABLET EVERY DAY (Patient taking differently: Take 40 mg by mouth every evening. )  . polyethylene glycol-electrolytes (NULYTELY) 420 g solution Take 4,000 mLs by mouth once.   . potassium chloride SA (KLOR-CON) 20 MEQ tablet Take on days you need to take demadex (Patient taking differently: Take 20 mEq by mouth daily as needed (weight gain over 3 lbs in 24 hours w/torsemide). )  . tamsulosin (FLOMAX) 0.4 MG CAPS capsule Take 0.4 mg by mouth daily as needed (bladder issues.).   [name] tetrahydrozoline-zinc (VISINE-AC) 0.05-0.25 % ophthalmic solution 2 drops 3 (three) times daily as needed.  . torsemide (DEMADEX) 20 MG tablet Take 20 mg for weight gain over 3 lbs in 24 hours (Patient taking differently: Take 20 mg by mouth daily as needed (weight gain over 3 lbs in 24 hours). )  . warfarin (COUMADIN) 2 MG tablet Take 2 mg by mouth every evening. MANAGED BY PMD     Allergies:   Niacin, Iodine-131, and Nsaids   Social History  Socioeconomic History  . Marital status: Married    Spouse name: Not on file  . Number of children: Not on file  . Years of education: Not on file  . Highest education level: Not on file  Occupational History  . Not on file  Tobacco Use  . Smoking status: Former Smoker    Quit date: 04/02/1998    Years since quitting: 21.7  . Smokeless tobacco: Never Used  Vaping Use  . Vaping Use: Never used  Substance and Sexual Activity  . Alcohol use: Yes    Alcohol/week: 0.0 - 2.0 standard drinks    Comment: OCCASIONAL  . Drug use: Never  . Sexual activity: Not on file  Other Topics Concern  . Not on file  Social History Narrative  . Not on file   Social Determinants of Health   Financial Resource Strain:   . Difficulty of Paying Living Expenses: Not on file  Food Insecurity:   . Worried About Charity fundraiser in the Last Year: Not on file  . Ran Out of Food in the Last Year: Not on file   Transportation Needs:   . Lack of Transportation (Medical): Not on file  . Lack of Transportation (Non-Medical): Not on file  Physical Activity:   . Days of Exercise per Week: Not on file  . Minutes of Exercise per Session: Not on file  Stress:   . Feeling of Stress : Not on file  Social Connections:   . Frequency of Communication with Friends and Family: Not on file  . Frequency of Social Gatherings with Friends and Family: Not on file  . Attends Religious Services: Not on file  . Active Member of Clubs or Organizations: Not on file  . Attends Archivist Meetings: Not on file  . Marital Status: Not on file     Family History:  The patient's family history includes Breast cancer in his sister; Cancer in his brother; Coronary artery disease in an other family member; Deep vein thrombosis in his father; Diabetes in his sister and sister; Heart attack in his brother and daughter; Heart disease in his brother and sister; Hyperlipidemia in his sister; Hypertension in his mother and sister; Lung cancer in his sister and sister. There is no history of Colon cancer or Stroke.  ROS:   Please see the history of present illness. All other systems are reviewed and otherwise negative.    EKGs/Labs/Other Studies Reviewed:    Studies reviewed are outlined and summarized above. Reports included below if pertinent.  [place] 2014 Cardiac Catheterization Operative Report  MISHA ANTONINI [id number] 6/27/20144:14 PM Purvis Kilts, MD  Procedure Performed:  1. Left Heart Catheterization 2. Selective Coronary Angiography 3. Left ventricular angiogram 4. SVG angiography 5. LIMA graft angiography  Operator: Lauree Chandler, MD  Arterial access site:  Right radial artery.   Indication: 79 yo male with history of CAD s/p CABG admitted with chest pain. Negative cardiac markers.                                   Procedure Details: The risks, benefits, complications,  treatment options, and expected outcomes were discussed with the patient. The patient and/or family concurred with the proposed plan, giving informed consent. The patient was brought to the cath lab after IV hydration was begun and oral premedication was given. The patient was further sedated with Versed and Fentanyl.  The left wrist was assessed with an Allens test which was positive. The left wrist was prepped and draped in a sterile fashion. 1% lidocaine was used for local anesthesia. Using the modified Seldinger access technique, a 5/6 hybrid sheath was placed in the left radial artery. 3 mg Verapamil was given through the sheath. 5000 units IV heparin was given. We chose to go from the left radial artery given his INR of 1.7. I could not use the right radial artery with his prior IMA bypass graft. I easily engaged the native vessels with standard catheters. The SVG to the Diagonal was engaged with a AL-1 catheter. The IMA was non-selectively visualized with a IMA catheter. A pigtail catheter was used to perform a left ventricular angiogram. The sheath was removed from the left radial artery and a Terumo hemostasis band was applied at the arteriotomy site on the right wrist.    There were no immediate complications. The patient was taken to the recovery area in stable condition.   Hemodynamic Findings: Central aortic pressure: 129/59 Left ventricular pressure: 136/10/16  Angiographic Findings:  Left main: No obstructive disease.   Left Anterior Descending Artery: Large caliber vessel that courses to the apex. There is diffuse 40% stenosis in the mid and distal LAD. There is a moderate caliber diagonal branch with mild plaque disease. There is competitive flow seen in the mid LAD from the graft flow into the diagonal branch.   Circumflex Artery: Large caliber vessel with large obtuse marginal branch. Mild plaque in the OM branch.   Right Coronary Artery: Moderate caliber vessel with diffuse  mild plaque in the proximal and mid vessel. The distal vessel is occluded. The distal vessel fills from left to right collaterals.   Left Ventricular Angiogram: LVEF=60-65%  Graft Anatomy:  SVG to PDA is occluded SVG to OM is occluded SVG to Diagonal is patent and fills the Diagonal and LAD LIMA to LAD is atretic.   Impression: 1. Single vessel CAD s/p 4V CABG with 1/4 patent bypass grafts 2. The distal RCA is occluded but fills briskly from left to right collaterals. (SVG is occluded to the distal RCA)  3. The Circumflex has no flow limiting disease (SVG is occluded to OM) 4. The LAD has no flow limiting disease and fills from antegrade flow and competitively from the SVG to Diagonal 5. Preserved LV systolic function  Recommendations: Continue medical therapy. Consider addition of long acting nitrates.        Complications:  None. The patient tolerated the procedure well.           2D echo 2017 Study Conclusions   - Left ventricle: The cavity size was normal. Wall thickness was  increased in a pattern of mild LVH. Systolic function was normal.  The estimated ejection fraction was in the range of 55% to 60%.  Wall motion was normal; there were no regional wall motion  abnormalities.  - Aortic valve: Moderately calcified annulus. Trileaflet; mildly  thickened leaflets. Valve area (VTI): 1.97 cm^2. Valve area  (Vmax): 2.11 cm^2.  - Mitral valve: Mildly calcified annulus. Mildly thickened leaflets  . There was mild regurgitation.  - Left atrium: The atrium was severely dilated.  - Right ventricle: The cavity size was mildly to moderately  dilated.  - Right atrium: The atrium was mildly dilated.  - Pulmonary arteries: Systolic pressure was moderately increased.  PA peak pressure: 59 mm Hg (S).     EKG:  EKG is ordered today, personally reviewed,  demonstrating atrial fibrillation 59bpm with frequent PVCs, TWI avL, otherwise nonacute   Recent  Labs: 07/02/2019: BUN 15; Creatinine, Ser 0.75; Potassium 4.2; Sodium 140  Recent Lipid Panel    Component Value Date/Time   CHOL 129 09/25/2012 0618   TRIG 152 (H) 09/25/2012 0618   HDL 31 (L) 09/25/2012 0618   CHOLHDL 4.2 09/25/2012 0618   VLDL 30 09/25/2012 0618   LDLCALC 68 09/25/2012 0618    PHYSICAL EXAM:    VS:  BP 130/62   Pulse (!) 52   Ht 5\' 10"  (1.778 m)   Wt 214 lb 6.4 oz (97.3 kg)   SpO2 95%   BMI 30.76 kg/m   BMI: Body mass index is 30.76 kg/m.  GEN: Well nourished, well developed WM, in no acute distress HEENT: normocephalic, atraumatic Neck: no JVD, carotid bruits, or masses, possible faint left subclavian bruit Cardiac: Irregularly irregular, rate controlled; no murmurs, rubs, or gallops, mild BLE edema L>R which patient states is chronic Respiratory:  clear to auscultation bilaterally, normal work of breathing GI: soft, nontender, nondistended, + BS MS: no deformity or atrophy Skin: warm and dry, no rash Neuro:  Alert and Oriented x 3, Strength and sensation are intact, follows commands Psych: euthymic mood, full affect  Wt Readings from Last 3 Encounters:  12/14/19 214 lb 6.4 oz (97.3 kg)  10/27/19 (!) 207 lb 11.2 oz (94.2 kg)  06/11/19 216 lb (98 kg)     ASSESSMENT & PLAN:   1. CAD s/p CABG - symptomatically stable without any new progressive anginal symptoms. Would continue current regimen except it is noted that he is only taking Lopressor once daily - it is listed as 12.5mg  daily but he is sure he is taking one whole 25mg  tablet daily. It sounds like at one point this was split up into 12.5mg  BID but he found it cumbersome to split up. Since metoprolol tartrate is short acting we will convert to the long acting form of metoprolol succinate to the same mg he is taking now (25mg ). Continue statin and aspirin. We are updating his CBC today. Obtain copy of lipid panel from PCP. 2. Permanent atrial fibrillation - rate controlled, no recent symptoms. INR  managed by PCP. Denies bleeding or falling. Watch for bradycardia. 3. Pulmonary HTN (suspected right heart failure) - discussed finding with patient as to whether it could pertain to his chronic dyspnea. He feels he might be slightly more SOB than previously but he states it's hard to know because he's been more sendentary. He reports at one point he was evaluated for OSA due to snoring but does not know the outcome. He politely declines any further workup for this at this time other than updating his echocardiogram which I think would be useful given his previous pulmonary artery pressure elevation and incidentally noted ectopy today. He will notify for any progression in symptoms. Labs being ordered today. We will also update CMET for our records today to review his baseline albumin since I am getting labs for his frequent PVCs that were incidentally noted. Reviewed 2g sodium restriction, 2L fluid restriction, daily weights with patient. 4. Essential HTN - controlled on present regimen. 5. Dyslipidemia - continue statin. Obtain copy of lipid panel from PCP. 6. Frequent PVCs - generally asymptomatic. Will update TSH, CBC, Mg, and also get CMET (to not only eval his lytes/Cr up but update albumin given his chronic edema - not on file recently previously). Update echocardiogram as outlined above. If no change in  LVEF, would continue conservative management per discussion regarding the patient's preferences.  Disposition: F/u with Dr. Harl Bowie to establish care in 6 months (patient states Dr. Donnetta Hutching suggested he would be a good fit to see). He also reports he has gotten both his Covid vaccines.  Medication Adjustments/Labs and Tests Ordered: Current medicines are reviewed at length with the patient today.  Concerns regarding medicines are outlined above. Medication changes, Labs and Tests ordered today are summarized above and listed in the Patient Instructions accessible in Encounters.    Signed, Charlie Pitter, PA-C  12/14/2019 2:45 PM    [facility] [address] in [place]. [place], [place] [address] Ph: [phone]; Fax [phone]

## 2019-12-14 ENCOUNTER — Other Ambulatory Visit (HOSPITAL_COMMUNITY)
Admission: RE | Admit: 2019-12-14 | Discharge: 2019-12-14 | Disposition: A | Discharge status: Home / Self Care | Payer: No Typology Code available for payment source | Source: Ambulatory Visit | Attending: Physician Assistant | Admitting: Physician Assistant

## 2019-12-14 ENCOUNTER — Encounter: Payer: Self-pay | Admitting: Physician Assistant

## 2019-12-14 ENCOUNTER — Other Ambulatory Visit: Payer: Self-pay

## 2019-12-14 ENCOUNTER — Encounter: Payer: Self-pay | Admitting: *Deleted

## 2019-12-14 ENCOUNTER — Ambulatory Visit: Payer: Medicare HMO | Admitting: Physician Assistant

## 2019-12-14 ENCOUNTER — Ambulatory Visit: Payer: Medicare HMO | Admitting: Cardiovascular Disease

## 2019-12-14 VITALS — BP 130/62 | HR 52 | Ht 70.0 in | Wt 214.4 lb

## 2019-12-14 DIAGNOSIS — I2510 Atherosclerotic heart disease of native coronary artery without angina pectoris: Secondary | ICD-10-CM

## 2019-12-14 DIAGNOSIS — E785 Hyperlipidemia, unspecified: Secondary | ICD-10-CM

## 2019-12-14 DIAGNOSIS — I272 Pulmonary hypertension, unspecified: Secondary | ICD-10-CM

## 2019-12-14 DIAGNOSIS — I493 Ventricular premature depolarization: Secondary | ICD-10-CM | POA: Diagnosis present

## 2019-12-14 DIAGNOSIS — I4821 Permanent atrial fibrillation: Secondary | ICD-10-CM | POA: Diagnosis not present

## 2019-12-14 DIAGNOSIS — I10 Essential (primary) hypertension: Secondary | ICD-10-CM

## 2019-12-14 LAB — COMPREHENSIVE METABOLIC PANEL
ALT: 10 U/L (ref 0–44)
AST: 19 U/L (ref 15–41)
Albumin: 3.8 g/dL (ref 3.5–5.0)
Alkaline Phosphatase: 92 U/L (ref 38–126)
Anion gap: 9 (ref 5–15)
BUN: 13 mg/dL (ref 8–23)
CO2: 23 mmol/L (ref 22–32)
Calcium: 9.2 mg/dL (ref 8.9–10.3)
Chloride: 107 mmol/L (ref 98–111)
Creatinine, Ser: 0.84 mg/dL (ref 0.61–1.24)
GFR calc Af Amer: >60 mL/min (ref >60)
GFR calc non Af Amer: >60 mL/min (ref >60)
Glucose, Bld: 97 mg/dL (ref 70–99)
Potassium: 4.2 mmol/L (ref 3.5–5.1)
Sodium: 139 mmol/L (ref 135–145)
Total Bilirubin: 1.3 mg/dL — ABNORMAL HIGH (ref 0.3–1.2)
Total Protein: 7.4 g/dL (ref 6.5–8.1)

## 2019-12-14 LAB — CBC
HCT: 35.6 % — ABNORMAL LOW (ref 39.0–52.0)
Hemoglobin: 10.6 g/dL — ABNORMAL LOW (ref 13.0–17.0)
MCH: 27.7 pg (ref 26.0–34.0)
MCHC: 29.8 g/dL — ABNORMAL LOW (ref 30.0–36.0)
MCV: 93.2 fL (ref 80.0–100.0)
Platelets: 236 10*3/uL (ref 150–400)
RBC: 3.82 MIL/uL — ABNORMAL LOW (ref 4.22–5.81)
RDW: 16.4 % — ABNORMAL HIGH (ref 11.5–15.5)
WBC: 7.3 10*3/uL (ref 4.0–10.5)
nRBC: 0 % (ref 0.0–0.2)

## 2019-12-14 LAB — TSH: TSH: 1.99 u[IU]/mL (ref 0.350–4.500)

## 2019-12-14 LAB — MAGNESIUM: Magnesium: 1.7 mg/dL (ref 1.7–2.4)

## 2019-12-14 MED ORDER — METOPROLOL SUCCINATE ER 25 MG PO TB24: 25.0000 mg | ORAL_TABLET | Freq: Every day | ORAL | 3 refills | Status: DC

## 2019-12-14 MED ORDER — NITROGLYCERIN 0.4 MG SL SUBL: 0.4000 mg | SUBLINGUAL_TABLET | SUBLINGUAL | 3 refills | Status: AC | PRN

## 2019-12-14 NOTE — Patient Instructions (Addendum)
Medication Instructions:  Your physician has recommended you make the following change in your medication:   Stop Taking Lopressor   Take Toprol XL 25 mg Daily   *If you need a refill on your cardiac medications before your next appointment, please call your pharmacy*   Lab Work: Your physician recommends that you return for lab work in: Today  If you have labs (blood work) drawn today and your tests are completely normal, you will receive your results only by:  [place] (if you have MyChart) OR  A paper copy in the mail If you have any lab test that is abnormal or we need to change your treatment, we will call you to review the results.   Testing/Procedures: Your physician has requested that you have an echocardiogram. Echocardiography is a painless test that uses sound waves to create images of your heart. It provides your doctor with information about the size and shape of your heart and how well your hearts chambers and valves are working. This procedure takes approximately one hour. There are no restrictions for this procedure.     Follow-Up: At [facility], you and your health needs are our priority.  As part of our continuing mission to provide you with exceptional heart care, we have created designated Provider Care Teams.  These Care Teams include your primary Cardiologist (physician) and Advanced Practice Providers (APPs -  Physician Assistants and Nurse Practitioners) who all work together to provide you with the care you need, when you need it.  We recommend signing up for the patient portal called "MyChart".  Sign up information is provided on this After Visit Summary.  MyChart is used to connect with patients for Virtual Visits (Telemedicine).  Patients are able to view lab/test results, encounter notes, upcoming appointments, etc.  Non-urgent messages can be sent to your provider as well.   To learn more about what you can do with MyChart, go to  [email or web address].    Your next appointment:   6 month(s)  The format for your next appointment:   In Person  Provider:   Carlyle Dolly, MD   Other Instructions Thank you for choosing [facility]!    For patients with history of fluid overload, we give them these special instructions:  1. Follow a low-salt diet - you are allowed no more than 2,000mg  of sodium per day. Watch your fluid intake. In general, you should not be taking in more than 2 liters of fluid per day (no more than 8 glasses per day). This includes sources of water in foods like soup, coffee, tea, milk, etc. 2. Weigh yourself on the same scale at same time of day and keep a log. 3. Call your doctor: (Anytime you feel any of the following symptoms)  - 3lb weight gain overnight or 5lb within a few days - Shortness of breath, with or without a dry hacking cough  - Swelling in the hands, feet or stomach  - If you have to sleep on extra pillows at night in order to breathe   IT IS IMPORTANT TO LET YOUR DOCTOR KNOW EARLY ON IF YOU ARE HAVING SYMPTOMS SO WE CAN HELP YOU!

## 2019-12-15 ENCOUNTER — Encounter: Payer: Self-pay | Admitting: *Deleted

## 2019-12-16 ENCOUNTER — Telehealth: Payer: Self-pay | Admitting: Physician Assistant

## 2019-12-16 ENCOUNTER — Encounter (HOSPITAL_COMMUNITY)
Admission: RE | Admit: 2019-12-16 | Discharge: 2019-12-16 | Disposition: A | Discharge status: Home / Self Care | Payer: Non-veteran care | Source: Ambulatory Visit | Attending: Gastroenterology | Admitting: Gastroenterology

## 2019-12-16 ENCOUNTER — Encounter (HOSPITAL_COMMUNITY): Payer: Self-pay

## 2019-12-16 ENCOUNTER — Other Ambulatory Visit: Payer: Self-pay

## 2019-12-16 ENCOUNTER — Other Ambulatory Visit (HOSPITAL_COMMUNITY)
Admission: RE | Admit: 2019-12-16 | Discharge: 2019-12-16 | Disposition: A | Discharge status: Home / Self Care | Payer: No Typology Code available for payment source | Source: Ambulatory Visit | Attending: Gastroenterology | Admitting: Gastroenterology

## 2019-12-16 DIAGNOSIS — I4510 Unspecified right bundle-branch block: Secondary | ICD-10-CM | POA: Diagnosis not present

## 2019-12-16 DIAGNOSIS — Z01810 Encounter for preprocedural cardiovascular examination: Secondary | ICD-10-CM | POA: Insufficient documentation

## 2019-12-16 DIAGNOSIS — Z20822 Contact with and (suspected) exposure to covid-19: Secondary | ICD-10-CM | POA: Insufficient documentation

## 2019-12-16 DIAGNOSIS — Z01812 Encounter for preprocedural laboratory examination: Secondary | ICD-10-CM | POA: Insufficient documentation

## 2019-12-16 LAB — SARS CORONAVIRUS 2 (TAT 6-24 HRS): SARS Coronavirus 2: NEGATIVE

## 2019-12-16 NOTE — Telephone Encounter (Signed)
New message    Day surgery called to get clearance for patient to have colonoscopy - explained I had to put form in to follow protocol , even though patient was just seen by [facility] Pre-operative Risk Assessment    HEARTCARE STAFF: - Please ensure there is not already an duplicate clearance open for this procedure. - Under Visit Info/Reason for Call, type in Other and utilize the format Clearance MM/DD/YY or Clearance TBD. Do not use dashes or single digits. - If request is for dental extraction, please clarify the # of teeth to be extracted.  Request for surgical clearance:  1. What type of surgery is being performed? colonoscopy  2. When is this surgery scheduled?  12/18/19  3. What type of clearance is required (medical clearance vs. Pharmacy clearance to hold med vs. Both)? Already on lovenox bridge - through primary care physician  4. Are there any medications that need to be held prior to surgery and how long?   5. Practice name and name of physician performing surgery? [facility]   6. What is the office phone number? [phone]   7.   What is the office fax number? Wants a call back - not fax   8.   Anesthesia type (None, local, MAC, general) ?propfol    Jannet Askew 12/16/2019, 10:55 AM  _________________________________________________________________   (provider comments below)

## 2019-12-16 NOTE — Telephone Encounter (Signed)
Placed a call to Day Surgery and have made them aware that pt has been cleared for Colonoscopy and there is a note in pt's chart.

## 2019-12-16 NOTE — Telephone Encounter (Signed)
   Primary Cardiologist: Kate Sable, MD (Inactive)  Chart reviewed as part of pre-operative protocol coverage. Patient was recently evaluated in office by Melina Copa, PA-C 12/14/19 and was without anginal complaints at that time. Based on ACC/AHA guidelines, YUL DIANA would be at acceptable risk for the planned procedure without further cardiovascular testing.   His coumadin is managed by his PCP and he has been transitioned to lovenox in the perioperative setting.   GI office requests a callback with preop assessment recommendations. Will route to callback to assist.  Please call with questions.  Abigail Butts, PA-C 12/16/2019, 11:52 AM

## 2019-12-18 ENCOUNTER — Ambulatory Visit (HOSPITAL_COMMUNITY): Payer: No Typology Code available for payment source | Admitting: Anesthesiology

## 2019-12-18 ENCOUNTER — Encounter (HOSPITAL_COMMUNITY)
Admission: RE | Disposition: A | Discharge status: Home / Self Care | Payer: Self-pay | Source: Home / Self Care | Attending: Gastroenterology

## 2019-12-18 ENCOUNTER — Encounter (HOSPITAL_COMMUNITY): Payer: Self-pay | Admitting: Gastroenterology

## 2019-12-18 ENCOUNTER — Ambulatory Visit (HOSPITAL_COMMUNITY)
Admission: RE | Admit: 2019-12-18 | Discharge: 2019-12-18 | Disposition: A | Discharge status: Home / Self Care | Payer: No Typology Code available for payment source | Attending: Gastroenterology | Admitting: Gastroenterology

## 2019-12-18 DIAGNOSIS — I2510 Atherosclerotic heart disease of native coronary artery without angina pectoris: Secondary | ICD-10-CM | POA: Diagnosis not present

## 2019-12-18 DIAGNOSIS — Z8546 Personal history of malignant neoplasm of prostate: Secondary | ICD-10-CM | POA: Diagnosis not present

## 2019-12-18 DIAGNOSIS — Z951 Presence of aortocoronary bypass graft: Secondary | ICD-10-CM | POA: Diagnosis not present

## 2019-12-18 DIAGNOSIS — Z7982 Long term (current) use of aspirin: Secondary | ICD-10-CM | POA: Diagnosis not present

## 2019-12-18 DIAGNOSIS — E119 Type 2 diabetes mellitus without complications: Secondary | ICD-10-CM | POA: Diagnosis not present

## 2019-12-18 DIAGNOSIS — M1990 Unspecified osteoarthritis, unspecified site: Secondary | ICD-10-CM | POA: Insufficient documentation

## 2019-12-18 DIAGNOSIS — K219 Gastro-esophageal reflux disease without esophagitis: Secondary | ICD-10-CM | POA: Insufficient documentation

## 2019-12-18 DIAGNOSIS — I10 Essential (primary) hypertension: Secondary | ICD-10-CM | POA: Diagnosis not present

## 2019-12-18 DIAGNOSIS — D123 Benign neoplasm of transverse colon: Secondary | ICD-10-CM | POA: Diagnosis not present

## 2019-12-18 DIAGNOSIS — E785 Hyperlipidemia, unspecified: Secondary | ICD-10-CM | POA: Insufficient documentation

## 2019-12-18 DIAGNOSIS — Z7901 Long term (current) use of anticoagulants: Secondary | ICD-10-CM | POA: Insufficient documentation

## 2019-12-18 DIAGNOSIS — Z87891 Personal history of nicotine dependence: Secondary | ICD-10-CM | POA: Diagnosis not present

## 2019-12-18 DIAGNOSIS — Z95820 Peripheral vascular angioplasty status with implants and grafts: Secondary | ICD-10-CM | POA: Diagnosis not present

## 2019-12-18 DIAGNOSIS — K5730 Diverticulosis of large intestine without perforation or abscess without bleeding: Secondary | ICD-10-CM | POA: Diagnosis not present

## 2019-12-18 DIAGNOSIS — Z79899 Other long term (current) drug therapy: Secondary | ICD-10-CM | POA: Diagnosis not present

## 2019-12-18 DIAGNOSIS — Z7984 Long term (current) use of oral hypoglycemic drugs: Secondary | ICD-10-CM | POA: Insufficient documentation

## 2019-12-18 DIAGNOSIS — I252 Old myocardial infarction: Secondary | ICD-10-CM | POA: Insufficient documentation

## 2019-12-18 DIAGNOSIS — Z1211 Encounter for screening for malignant neoplasm of colon: Secondary | ICD-10-CM | POA: Insufficient documentation

## 2019-12-18 DIAGNOSIS — I4891 Unspecified atrial fibrillation: Secondary | ICD-10-CM | POA: Insufficient documentation

## 2019-12-18 DIAGNOSIS — D122 Benign neoplasm of ascending colon: Secondary | ICD-10-CM | POA: Diagnosis not present

## 2019-12-18 DIAGNOSIS — Z923 Personal history of irradiation: Secondary | ICD-10-CM | POA: Diagnosis not present

## 2019-12-18 DIAGNOSIS — I272 Pulmonary hypertension, unspecified: Secondary | ICD-10-CM | POA: Insufficient documentation

## 2019-12-18 HISTORY — PX: COLONOSCOPY WITH PROPOFOL: SHX5780

## 2019-12-18 HISTORY — PX: POLYPECTOMY: SHX5525

## 2019-12-18 LAB — GLUCOSE, CAPILLARY
Glucose-Capillary: 102 mg/dL — ABNORMAL HIGH (ref 70–99)
Glucose-Capillary: 99 mg/dL (ref 70–99)

## 2019-12-18 LAB — HM COLONOSCOPY

## 2019-12-18 SURGERY — COLONOSCOPY WITH PROPOFOL
Anesthesia: General

## 2019-12-18 MED ORDER — PROPOFOL 10 MG/ML IV BOLUS
INTRAVENOUS | Status: DC | PRN
  Administered 2019-12-18: 50 mg via INTRAVENOUS
  Administered 2019-12-18: 20 mg via INTRAVENOUS

## 2019-12-18 MED ORDER — CHLORHEXIDINE GLUCONATE CLOTH 2 % EX PADS: 6.0000 | MEDICATED_PAD | Freq: Once | CUTANEOUS | Status: DC

## 2019-12-18 MED ORDER — STERILE WATER FOR IRRIGATION IR SOLN
Status: DC | PRN
  Administered 2019-12-18: 1.5 mL

## 2019-12-18 MED ORDER — LACTATED RINGERS IV SOLN: INTRAVENOUS | Status: DC | PRN

## 2019-12-18 MED ORDER — EPHEDRINE SULFATE 50 MG/ML IJ SOLN
INTRAMUSCULAR | Status: DC | PRN
  Administered 2019-12-18 (×3): 10 mg via INTRAVENOUS

## 2019-12-18 MED ORDER — PROPOFOL 500 MG/50ML IV EMUL
INTRAVENOUS | Status: DC | PRN
  Administered 2019-12-18: 150 ug/kg/min via INTRAVENOUS

## 2019-12-18 MED ORDER — LACTATED RINGERS IV SOLN
Freq: Once | INTRAVENOUS | Status: AC
  Administered 2019-12-18: 1000 mL via INTRAVENOUS

## 2019-12-18 NOTE — H&P (Signed)
Troy [name] is an 79 y.o. male.   Chief Complaint: screening colonoscopy HPI:  79 year old male With past medical history of AAA, coronary artery disease status post CABG, carotid artery disease, diabetes, hyperlipidemia, GERD, hypertension, and atrial fibrillation, , who comes to the hospital to undergo screening colonoscopy.  The patient had his last colonoscopy 10 years ago, he reports it was unremarkable.  She denies having any complaints such as melena, hematochezia, abdominal pain or distention, change in her bowel movement consistency or frequency, no changes in her weight recently.  No family history of colorectal cancer.  The patient has been using Lovenox subcutaneous injections for the last 5 days and has not taken any Coumadin for the last 5 days as well.   Past Medical History:  Diagnosis Date  . AAA (abdominal aortic aneurysm) ([place])    Followed by Dr. Curt Jews  . Arthritis   . CAD (coronary artery disease)    a. CABG 2000.  [name] [facility])    Prostate:  Radiation Tx  . Carotid artery disease ([place])   . Chest pain    precordial. mild chronic .[name]... nonischemic  . Chronic edema   . Coronary artery disease    a. Nuclear, January, 2008, no ischemia b. Cath 08/2012- 1/4 patent grafts, RCA CTO, no flow-limiting disease, medically managed  . Diabetes mellitus without complication ([place])   . Dizziness 02/2011  . Dyslipidemia   . GERD (gastroesophageal reflux disease)    TAKES TUMS & ROLAIDS AS NEEDED  . Hx of CABG 2000  . Hypertension   . Myocardial infarction ([place]) 1999  . Neck pain 02/2011  . Pneumonia   . Pulmonary hypertension ([place])   . Renal artery stenosis ([facility])    50-70%  . S/P femoropopliteal bypass surgery    Dr. Donnetta Hutching  . Sinus bradycardia    Asymptomatic    Past Surgical History:  Procedure Laterality Date  . CARDIAC CATHETERIZATION  09/26/2012   1/4 patent bypass (occluded SVG-PDA, SVG-OM, LIMA-LAD), SVG-diagonal patent and fills the diagonal and LAD, distal  RCA occlusion with left to right collateralization, patent circumflex, LAD with no flow-limiting disease and antegrade flow competitively from SVG-diagonal; EF 60-65%  . COLONOSCOPY  11/30/2009   UYQ:[id number]. next TCS 11/2019  . CORONARY ARTERY BYPASS GRAFT  2000  . ESOPHAGOGASTRODUODENOSCOPY N/A 08/12/2013   IEP:[id number] peptic stricture with erosive refluxesophagitis - status post Maloney dilation. Hiatal hernia. Abnormalgastric mucosa. Deformity of the pyloric channel suggestive ofprior peptic ulcer disease. Duodenal bulbar diverticulum Statuspost gastric biopsy. h.pylori  . LEFT HEART CATHETERIZATION WITH CORONARY/GRAFT ANGIOGRAM N/A 09/26/2012   Procedure: LEFT HEART CATHETERIZATION WITH Beatrix Fetters;  Surgeon: Burnell Blanks, MD;  Location: [facility] CATH LAB;  Service: Cardiovascular;  Laterality: N/A;  . Venia Minks DILATION N/A 08/12/2013   Procedure: Venia Minks DILATION;  Surgeon: Daneil Dolin, MD;  Location: [facility] ENDO SUITE;  Service: Endoscopy;  Laterality: N/A;  . PR VEIN BYPASS GRAFT,AORTO-FEM-POP Right 07/19/1999  . PR VEIN BYPASS GRAFT,AORTO-FEM-POP Left 05/02/2006  . SAVORY DILATION N/A 08/12/2013   Procedure: SAVORY DILATION;  Surgeon: Daneil Dolin, MD;  Location: [facility] ENDO SUITE;  Service: Endoscopy;  Laterality: N/A;  . WRIST SURGERY     cyst removal    Family History  Problem Relation Age of Onset  . Deep vein thrombosis Father   . Lung cancer Sister   . Diabetes Sister   . Heart disease Sister        After age 41  . Hyperlipidemia Sister   .  Hypertension Sister   . Lung cancer Sister   . Breast cancer Sister   . Hypertension Mother   . Diabetes Sister   . Coronary artery disease Other        family hx of  . Cancer Brother        "crab cancer"  . Heart disease Brother   . Heart attack Brother   . Heart attack Daughter   . Colon cancer Neg Hx   . Stroke Neg Hx    Social History:  reports that he quit smoking about 21 years ago. He has never  used smokeless tobacco. He reports current alcohol use. He reports that he does not use drugs.  Allergies:  Allergies  Allergen Reactions  . Niacin Itching and Rash    Burning sensation  . Iodine-131 Nausea And Vomiting  . Nsaids Other (See Comments)    Taking Coumadin    Medications Prior to Admission  Medication Sig Dispense Refill  . amLODipine (NORVASC) 10 MG tablet Take 10 mg by mouth every evening.     [name] aspirin EC 81 MG EC tablet Take 1 tablet (81 mg total) by mouth daily. (Patient taking differently: Take 81 mg by mouth every evening. ) 30 tablet 3  . atorvastatin (LIPITOR) 40 MG tablet Take 1 tablet (40 mg total) by mouth daily. (Patient taking differently: Take 40 mg by mouth every evening. ) 90 tablet 1  . enoxaparin (LOVENOX) 100 MG/ML injection Inject 0.9 mLs (90 mg total) into the skin every 12 (twelve) hours for 7 days. Start on 9/13 (last dose of warfarin on 9/12) but DO NOT take in the morning of 9/17 13 mL 0  . HYDROcodone-acetaminophen (NORCO/VICODIN) 5-325 MG tablet Take 1 tablet by mouth daily as needed for moderate pain.    [name] ipratropium (ATROVENT) 0.03 % nasal spray Place 2 sprays into both nostrils in the morning and at bedtime.    . isosorbide mononitrate (IMDUR) 60 MG 24 hr tablet Take 60 mg by mouth every evening.     [name] losartan (COZAAR) 100 MG tablet Take 100 mg by mouth daily.      . metFORMIN (GLUCOPHAGE) 1000 MG tablet Take 1 tablet (1,000 mg total) by mouth 2 (two) times daily with a meal. (Patient taking differently: Take 1,000 mg by mouth daily with breakfast. )    . metoprolol succinate (TOPROL XL) 25 MG 24 hr tablet Take 1 tablet (25 mg total) by mouth daily. 90 tablet 3  . pantoprazole (PROTONIX) 40 MG tablet TAKE 1 TABLET EVERY DAY (Patient taking differently: Take 40 mg by mouth every evening. ) 90 tablet 3  . polyethylene glycol-electrolytes (NULYTELY) 420 g solution Take 4,000 mLs by mouth once.     . potassium chloride SA (KLOR-CON) 20 MEQ tablet  Take on days you need to take demadex (Patient taking differently: Take 20 mEq by mouth daily as needed (weight gain over 3 lbs in 24 hours w/torsemide). ) 60 tablet 0  . tamsulosin (FLOMAX) 0.4 MG CAPS capsule Take 0.4 mg by mouth daily as needed (bladder issues.).     [name] tetrahydrozoline-zinc (VISINE-AC) 0.05-0.25 % ophthalmic solution 2 drops 3 (three) times daily as needed.    . torsemide (DEMADEX) 20 MG tablet Take 20 mg for weight gain over 3 lbs in 24 hours (Patient taking differently: Take 20 mg by mouth daily as needed (weight gain over 3 lbs in 24 hours). ) 60 tablet 0  . warfarin (COUMADIN) 2 MG tablet Take  2 mg by mouth every evening. MANAGED BY PMD    . Isopropyl Alcohol (ALCOHOL PREP PADS EX)     . nitroGLYCERIN (NITROSTAT) 0.4 MG SL tablet Place 1 tablet (0.4 mg total) under the tongue every 5 (five) minutes x 3 doses as needed for chest pain. 25 tablet 3    Results for orders placed or performed during the hospital encounter of 12/18/19 (from the past 48 hour(s))  Glucose, capillary     Status: Abnormal   Collection Time: 12/18/19 10:15 AM  Result Value Ref Range   Glucose-Capillary 102 (H) 70 - 99 mg/dL    Comment: Glucose reference range applies only to samples taken after fasting for at least 8 hours.   No results found.  Review of Systems  Constitutional: Negative.   HENT: Negative.   Eyes: Negative.   Respiratory: Negative.   Cardiovascular: Negative.   Gastrointestinal: Negative.   Endocrine: Negative.   Genitourinary: Negative.   Musculoskeletal: Negative.   Skin: Negative.   Allergic/Immunologic: Negative.   Neurological: Negative.   Hematological: Negative.   Psychiatric/Behavioral: Negative.     Blood pressure (!) 146/63, pulse 67, temperature 97.6 F (36.4 C), temperature source Oral, resp. rate 16, SpO2 97 %. Physical Exam  GENERAL: The patient is AO x3, in no acute distress. Elder. HEENT: Head is normocephalic and atraumatic. EOMI are intact. Mouth  is well hydrated and without lesions. NECK: Supple. No masses LUNGS: Clear to auscultation. No presence of rhonchi/wheezing/rales. Adequate chest expansion HEART: RRR, normal s1 and s2. ABDOMEN: Soft, nontender, no guarding, no peritoneal signs, and nondistended. BS +. No masses. EXTREMITIES: Without any cyanosis, clubbing, rash, lesions or edema. NEUROLOGIC: AOx3, no focal motor deficit. SKIN: no jaundice, no rashes  Assessment/Plan 6-year-old male With past medical history of AAA, coronary artery disease status post CABG, carotid artery disease, diabetes, hyperlipidemia, GERD, hypertension, and atrial fibrillation, , who comes to the hospital to undergo screening colonoscopy.  We will proceed with screening colonoscopy today.  Harvel Quale, MD 12/18/2019, 11:05 AM

## 2019-12-18 NOTE — Transfer of Care (Signed)
Immediate Anesthesia Transfer of Care Note  Patient: Troy [name]  Procedure(s) Performed: COLONOSCOPY WITH PROPOFOL (N/A ) POLYPECTOMY  Patient Location: PACU  Anesthesia Type:MAC and General  Level of Consciousness: awake, alert , oriented and patient cooperative  Airway & Oxygen Therapy: Patient Spontanous Breathing  Post-op Assessment: Report given to RN and Post -op Vital signs reviewed and stable  Post vital signs: Reviewed and stable  Last Vitals:  Vitals Value Taken Time  BP    Temp    Pulse 55 12/18/19 1153  Resp 11 12/18/19 1153  SpO2 92 % 12/18/19 1153  Vitals shown include unvalidated device data.  Last Pain:  Vitals:   12/18/19 1108  TempSrc:   PainSc: 0-No pain      Patients Stated Pain Goal: 7 (28/11/88 6773)  Complications: No complications documented.

## 2019-12-18 NOTE — Discharge Instructions (Signed)
You are being discharged to home.  Resume your previous diet.  We are waiting for your pathology results.  Your physician has recommended a repeat colonoscopy in three years for surveillance.  Resume taking Coumadin (warfarin) at your prior dose today.    Monitored Anesthesia Care, Care After These instructions provide you with information about caring for yourself after your procedure. Your health care provider may also give you more specific instructions. Your treatment has been planned according to current medical practices, but problems sometimes occur. Call your health care provider if you have any problems or questions after your procedure. What can I expect after the procedure? After your procedure, you may:  Feel sleepy for several hours.  Feel clumsy and have poor balance for several hours.  Feel forgetful about what happened after the procedure.  Have poor judgment for several hours.  Feel nauseous or vomit.  Have a sore throat if you had a breathing tube during the procedure. Follow these instructions at home: For at least 24 hours after the procedure:      Have a responsible adult stay with you. It is important to have someone help care for you until you are awake and alert.  Rest as needed.  Do not: ? Participate in activities in which you could fall or become injured. ? Drive. ? Use heavy machinery. ? Drink alcohol. ? Take sleeping pills or medicines that cause drowsiness. ? Make important decisions or sign legal documents. ? Take care of children on your own. Eating and drinking  Follow the diet that is recommended by your health care provider.  If you vomit, drink water, juice, or soup when you can drink without vomiting.  Make sure you have little or no nausea before eating solid foods. General instructions  Take over-the-counter and prescription medicines only as told by your health care provider.  If you have sleep apnea, surgery and certain  medicines can increase your risk for breathing problems. Follow instructions from your health care provider about wearing your sleep device: ? Anytime you are sleeping, including during daytime naps. ? While taking prescription pain medicines, sleeping medicines, or medicines that make you drowsy.  If you smoke, do not smoke without supervision.  Keep all follow-up visits as told by your health care provider. This is important. Contact a health care provider if:  You keep feeling nauseous or you keep vomiting.  You feel light-headed.  You develop a rash.  You have a fever. Get help right away if:  You have trouble breathing. Summary  For several hours after your procedure, you may feel sleepy and have poor judgment.  Have a responsible adult stay with you for at least 24 hours or until you are awake and alert. This information is not intended to replace advice given to you by your health care provider. Make sure you discuss any questions you have with your health care provider. Document Revised: 06/17/2017 Document Reviewed: 07/10/2015 Elsevier Patient Education  [place].

## 2019-12-18 NOTE — Anesthesia Preprocedure Evaluation (Signed)
Anesthesia Evaluation  Patient identified by MRN, date of birth, ID band Patient awake    Reviewed: Allergy & Precautions, NPO status , Patient's Chart, lab work & pertinent test results, reviewed documented beta blocker date and time   History of Anesthesia Complications Negative for: history of anesthetic complications  Airway Mallampati: II  TM Distance: >3 FB Neck ROM: Full   Comment: Vocal cord paralysis  Dental  (+) Edentulous Upper, Edentulous Lower   Pulmonary pneumonia, resolved, neg COPD, former smoker,    Pulmonary exam normal breath sounds clear to auscultation       Cardiovascular Exercise Tolerance: Poor hypertension, Pt. on medications and Pt. on home beta blockers + CAD, + Past MI, + CABG and + Peripheral Vascular Disease (AAA)   Rhythm:Irregular Rate:Normal  The cavity size was normal. Wall thickness was  increased in a pattern of mild LVH. Systolic function was normal.  The estimated ejection fraction was in the range of 55% to 60%.  Wall motion was normal; there were no regional wall motion  abnormalities.  - Aortic valve: Moderately calcified annulus. Trileaflet; mildly  thickened leaflets. Valve area (VTI): 1.97 cm^2. Valve area  (Vmax): 2.11 cm^2.  - Mitral valve: Mildly calcified annulus. Mildly thickened leaflets  . There was mild regurgitation.  - Left atrium: The atrium was severely dilated.  - Right ventricle: The cavity size was mildly to moderately  dilated.  - Right atrium: The atrium was mildly dilated.  - Pulmonary arteries: Systolic pressure was moderately increased.  PA peak pressure: 59 mm Hg (S).    Neuro/Psych negative neurological ROS  negative psych ROS   GI/Hepatic Neg liver ROS, PUD, GERD  Medicated and Controlled,  Endo/Other  diabetes, Well Controlled, Type 2  Renal/GU negative Renal ROS     Musculoskeletal  (+) Arthritis ,   Abdominal   Peds   Hematology negative hematology ROS (+)   Anesthesia Other Findings Vocal cord paralysis   Reproductive/Obstetrics                             Anesthesia Physical Anesthesia Plan  ASA: III  Anesthesia Plan: General   Post-op Pain Management:    Induction: Intravenous  PONV Risk Score and Plan: TIVA  Airway Management Planned: Nasal Cannula and Natural Airway  Additional Equipment:   Intra-op Plan:   Post-operative Plan:   Informed Consent: I have reviewed the patients History and Physical, chart, labs and discussed the procedure including the risks, benefits and alternatives for the proposed anesthesia with the patient or authorized representative who has indicated his/her understanding and acceptance.       Plan Discussed with: Surgeon and CRNA  Anesthesia Plan Comments:         Anesthesia Quick Evaluation

## 2019-12-18 NOTE — Op Note (Addendum)
[facility] Patient Name: Troy [name] Procedure Date: 12/18/2019 10:53 AM MRN: [id number] Date of Birth: [birth year] Attending MD: Maylon Peppers ,  CSN: [id number] Age: 79 Admit Type: Outpatient Procedure:                Colonoscopy Indications:              Screening for colorectal malignant neoplasm Providers:                Maylon Peppers, Crystal Page, Gwynneth Albright RN, RN Referring MD:              Medicines:                Monitored Anesthesia Care Complications:            No immediate complications. Estimated Blood Loss:     Estimated blood loss: none. Procedure:                Pre-Anesthesia Assessment:                           - Prior to the procedure, a History and Physical                            was performed, and patient medications, allergies                            and sensitivities were reviewed. The patient's                            tolerance of previous anesthesia was reviewed.                           - The risks and benefits of the procedure and the                            sedation options and risks were discussed with the                            patient. All questions were answered and informed                            consent was obtained.                           After obtaining informed consent, the colonoscope                            was passed under direct vision. Throughout the                            procedure, the patient's blood pressure, pulse, and                            oxygen saturations were monitored continuously. The  PCF-[id number] ([id number]) was introduced through the                            anus and advanced to the the cecum, identified by                            appendiceal orifice and ileocecal valve. The                            colonoscopy was performed without difficulty. The                            patient tolerated the procedure  well. The quality                            of the bowel preparation was adequate. Scope                            withdrawal time was 17 minutes. Scope In: 11:11:20 AM Scope Out: 11:46:24 AM Scope Withdrawal Time: 0 hours 23 minutes 9 seconds  Total Procedure Duration: 0 hours 35 minutes 4 seconds  Findings:      The perianal and digital rectal examinations were normal.      Four sessile polyps were found in the ascending colon. The polyps were 2       to 5 mm in size. These polyps were removed with a cold snare. Resection       and retrieval were complete.      A 4 mm polyp was found in the transverse colon. The polyp was sessile.       The polyp was removed with a cold snare. Resection and retrieval were       complete.      A 2 mm polyp was found in the transverse colon. The polyp was sessile.       The polyp was removed with a cold biopsy forceps. Resection and       retrieval were complete.      Multiple small and large-mouthed diverticula were found in the       descending colon and ascending colon.      The retroflexed view of the distal rectum and anal verge was normal and       showed no anal or rectal abnormalities. Impression:               - Four 2 to 5 mm polyps in the ascending colon,                            removed with a cold snare. Resected and retrieved.                           - One 4 mm polyp in the transverse colon, removed                            with a cold snare. Resected and retrieved.                           -  One 2 mm polyp in the transverse colon, removed                            with a cold biopsy forceps. Resected and retrieved.                           - Diverticulosis in the descending colon and in the                            ascending colon.                           - The distal rectum and anal verge are normal on                            retroflexion view. Moderate Sedation:      Per Anesthesia Care Recommendation:           -  Discharge patient to home (ambulatory).                           - Resume previous diet.                           - Await pathology results.                           - Repeat colonoscopy in 3 years for surveillance.                           - Resume Coumadin (warfarin) at prior dose today. Procedure Code(s):        --- Professional ---                           [id number], GC, Colonoscopy, flexible; with removal of                            tumor(s), polyp(s), or other lesion(s) by snare                            technique                           32355, 87, Colonoscopy, flexible; with biopsy,                            single or multiple Diagnosis Code(s):        --- Professional ---                           K63.5, Polyp of colon                           Z12.11, Encounter for screening for malignant                            neoplasm  of colon CPT copyright 2019 American Medical Association. All rights reserved. The codes documented in this report are preliminary and upon coder review may  be revised to meet current compliance requirements. Maylon Peppers, MD Maylon Peppers,  12/18/2019 11:52:56 AM This report has been signed electronically. Number of Addenda: 0

## 2019-12-18 NOTE — Anesthesia Postprocedure Evaluation (Signed)
Anesthesia Post Note  Patient: Troy [name]  Procedure(s) Performed: COLONOSCOPY WITH PROPOFOL (N/A ) POLYPECTOMY  Patient location during evaluation: PACU Anesthesia Type: General Level of consciousness: awake and alert Pain management: pain level controlled Vital Signs Assessment: post-procedure vital signs reviewed and stable Respiratory status: spontaneous breathing Cardiovascular status: stable Postop Assessment: no apparent nausea or vomiting Anesthetic complications: no   No complications documented.   Last Vitals:  Vitals:   12/18/19 0957 12/18/19 1145  BP: (!) 146/63 (!) 110/43  Pulse: 67 67  Resp: 16 (!) 22  Temp: 36.4 C (!) 35.9 C  SpO2: 97% 95%    Last Pain:  Vitals:   12/18/19 1145  TempSrc:   PainSc: 0-No pain                 Everette Rank

## 2019-12-21 LAB — SURGICAL PATHOLOGY

## 2019-12-23 ENCOUNTER — Encounter (HOSPITAL_COMMUNITY): Payer: Self-pay | Admitting: Gastroenterology

## 2019-12-24 DIAGNOSIS — R490 Dysphonia: Secondary | ICD-10-CM | POA: Diagnosis not present

## 2019-12-24 DIAGNOSIS — Z79899 Other long term (current) drug therapy: Secondary | ICD-10-CM | POA: Diagnosis not present

## 2019-12-24 DIAGNOSIS — Z886 Allergy status to analgesic agent status: Secondary | ICD-10-CM | POA: Diagnosis not present

## 2019-12-24 DIAGNOSIS — R1310 Dysphagia, unspecified: Secondary | ICD-10-CM | POA: Diagnosis not present

## 2019-12-24 DIAGNOSIS — Z87891 Personal history of nicotine dependence: Secondary | ICD-10-CM | POA: Diagnosis not present

## 2019-12-24 DIAGNOSIS — J3801 Paralysis of vocal cords and larynx, unilateral: Secondary | ICD-10-CM | POA: Diagnosis not present

## 2019-12-24 DIAGNOSIS — R1312 Dysphagia, oropharyngeal phase: Secondary | ICD-10-CM | POA: Diagnosis not present

## 2019-12-24 DIAGNOSIS — R1313 Dysphagia, pharyngeal phase: Secondary | ICD-10-CM | POA: Diagnosis not present

## 2019-12-24 DIAGNOSIS — Z7984 Long term (current) use of oral hypoglycemic drugs: Secondary | ICD-10-CM | POA: Diagnosis not present

## 2019-12-29 DIAGNOSIS — I25708 Atherosclerosis of coronary artery bypass graft(s), unspecified, with other forms of angina pectoris: Secondary | ICD-10-CM | POA: Diagnosis not present

## 2019-12-29 DIAGNOSIS — E6609 Other obesity due to excess calories: Secondary | ICD-10-CM | POA: Diagnosis not present

## 2019-12-29 DIAGNOSIS — I4891 Unspecified atrial fibrillation: Secondary | ICD-10-CM | POA: Diagnosis not present

## 2019-12-29 DIAGNOSIS — Z7901 Long term (current) use of anticoagulants: Secondary | ICD-10-CM | POA: Diagnosis not present

## 2019-12-29 DIAGNOSIS — Z6832 Body mass index (BMI) 32.0-32.9, adult: Secondary | ICD-10-CM | POA: Diagnosis not present

## 2019-12-29 DIAGNOSIS — E119 Type 2 diabetes mellitus without complications: Secondary | ICD-10-CM | POA: Diagnosis not present

## 2019-12-29 DIAGNOSIS — Z23 Encounter for immunization: Secondary | ICD-10-CM | POA: Diagnosis not present

## 2019-12-29 DIAGNOSIS — E7849 Other hyperlipidemia: Secondary | ICD-10-CM | POA: Diagnosis not present

## 2019-12-31 DIAGNOSIS — N182 Chronic kidney disease, stage 2 (mild): Secondary | ICD-10-CM | POA: Diagnosis not present

## 2019-12-31 DIAGNOSIS — E1122 Type 2 diabetes mellitus with diabetic chronic kidney disease: Secondary | ICD-10-CM | POA: Diagnosis not present

## 2019-12-31 DIAGNOSIS — I129 Hypertensive chronic kidney disease with stage 1 through stage 4 chronic kidney disease, or unspecified chronic kidney disease: Secondary | ICD-10-CM | POA: Diagnosis not present

## 2019-12-31 DIAGNOSIS — I25708 Atherosclerosis of coronary artery bypass graft(s), unspecified, with other forms of angina pectoris: Secondary | ICD-10-CM | POA: Diagnosis not present

## 2020-01-08 ENCOUNTER — Encounter (INDEPENDENT_AMBULATORY_CARE_PROVIDER_SITE_OTHER): Payer: Self-pay | Admitting: *Deleted

## 2020-01-15 ENCOUNTER — Other Ambulatory Visit: Payer: Self-pay | Admitting: Physician Assistant

## 2020-01-15 DIAGNOSIS — I272 Pulmonary hypertension, unspecified: Secondary | ICD-10-CM

## 2020-01-15 DIAGNOSIS — I493 Ventricular premature depolarization: Secondary | ICD-10-CM

## 2020-01-18 ENCOUNTER — Other Ambulatory Visit: Payer: Self-pay

## 2020-01-18 ENCOUNTER — Ambulatory Visit (HOSPITAL_COMMUNITY)
Admission: RE | Admit: 2020-01-18 | Discharge: 2020-01-18 | Disposition: A | Discharge status: Home / Self Care | Payer: Medicare HMO | Source: Ambulatory Visit | Attending: Family Medicine | Admitting: Family Medicine

## 2020-01-18 DIAGNOSIS — I493 Ventricular premature depolarization: Secondary | ICD-10-CM | POA: Insufficient documentation

## 2020-01-18 DIAGNOSIS — I272 Pulmonary hypertension, unspecified: Secondary | ICD-10-CM | POA: Diagnosis not present

## 2020-01-18 LAB — ECHOCARDIOGRAM COMPLETE
AR max vel: 1.86 cm2
AV Area VTI: 1.92 cm2
AV Area mean vel: 2.01 cm2
AV Mean grad: 6 mmHg
AV Peak grad: 11.8 mmHg
Ao pk vel: 1.72 m/s
Area-P 1/2: 3.13 cm2
P 1/2 time: 414 msec
S' Lateral: 3.83 cm

## 2020-01-18 NOTE — Progress Notes (Signed)
*  PRELIMINARY RESULTS* Echocardiogram 2D Echocardiogram has been performed.  Samuel Germany 01/18/2020, 1:50 PM

## 2020-01-20 ENCOUNTER — Telehealth: Payer: Self-pay | Admitting: *Deleted

## 2020-01-20 DIAGNOSIS — I272 Pulmonary hypertension, unspecified: Secondary | ICD-10-CM

## 2020-01-20 DIAGNOSIS — R079 Chest pain, unspecified: Secondary | ICD-10-CM

## 2020-01-20 NOTE — Telephone Encounter (Signed)
-----   Message from Charlie Pitter, [place] sent at 01/19/2020 11:18 AM EDT ----- Reviewed echo results with patient. Originally at last visit he had preferred a more conservative approach but I revisited this given the progression in pulmonary artery pressures. I wanted to get a sense for whether the patient wanted to pursue further workup for his now severe pulmonary HTN. He has not had previous evaluation for this. He has had chronic dyspnea and does wish to proceed with further evaluation. I told him I would review with one of our pulmonary hypertension doctors.  I discussed with Dr. Aundra Dubin. Would recommend PFTs and VQ scan to start. PFTs are for pulmonary HTN and VQ is to exclude chronic PE. Can go ahead and get these rolling. Dr. Aundra Dubin indicated they would be happy to see him in consultation so please refer to the Advanced Heart Failure clinic as well. Thank you!

## 2020-01-20 NOTE — Addendum Note (Signed)
Addended by: Levonne Hubert on: 01/20/2020 09:15 AM   Modules accepted: Orders

## 2020-01-20 NOTE — Telephone Encounter (Signed)
Orders placed.

## 2020-01-30 DIAGNOSIS — E1122 Type 2 diabetes mellitus with diabetic chronic kidney disease: Secondary | ICD-10-CM | POA: Diagnosis not present

## 2020-01-30 DIAGNOSIS — I129 Hypertensive chronic kidney disease with stage 1 through stage 4 chronic kidney disease, or unspecified chronic kidney disease: Secondary | ICD-10-CM | POA: Diagnosis not present

## 2020-01-30 DIAGNOSIS — N182 Chronic kidney disease, stage 2 (mild): Secondary | ICD-10-CM | POA: Diagnosis not present

## 2020-01-30 DIAGNOSIS — I25708 Atherosclerosis of coronary artery bypass graft(s), unspecified, with other forms of angina pectoris: Secondary | ICD-10-CM | POA: Diagnosis not present

## 2020-02-09 ENCOUNTER — Telehealth (HOSPITAL_COMMUNITY): Payer: Self-pay | Admitting: Vascular Surgery

## 2020-02-09 NOTE — Telephone Encounter (Signed)
Left pt VM , giving new chf appt w/ [facility] , asked pt to call back ro confirm appt

## 2020-02-22 DIAGNOSIS — Z7901 Long term (current) use of anticoagulants: Secondary | ICD-10-CM | POA: Diagnosis not present

## 2020-03-01 DIAGNOSIS — N182 Chronic kidney disease, stage 2 (mild): Secondary | ICD-10-CM | POA: Diagnosis not present

## 2020-03-01 DIAGNOSIS — I25708 Atherosclerosis of coronary artery bypass graft(s), unspecified, with other forms of angina pectoris: Secondary | ICD-10-CM | POA: Diagnosis not present

## 2020-03-01 DIAGNOSIS — I129 Hypertensive chronic kidney disease with stage 1 through stage 4 chronic kidney disease, or unspecified chronic kidney disease: Secondary | ICD-10-CM | POA: Diagnosis not present

## 2020-03-01 DIAGNOSIS — E1122 Type 2 diabetes mellitus with diabetic chronic kidney disease: Secondary | ICD-10-CM | POA: Diagnosis not present

## 2020-03-18 ENCOUNTER — Encounter (HOSPITAL_COMMUNITY): Payer: Medicare HMO | Admitting: Cardiology

## 2020-03-26 ENCOUNTER — Emergency Department (HOSPITAL_COMMUNITY): Payer: Medicare HMO

## 2020-03-26 ENCOUNTER — Encounter (HOSPITAL_COMMUNITY): Payer: Self-pay | Admitting: Emergency Medicine

## 2020-03-26 ENCOUNTER — Inpatient Hospital Stay (HOSPITAL_COMMUNITY)
Admission: EM | Admit: 2020-03-26 | Discharge: 2020-03-29 | DRG: 871 | Disposition: A | Discharge status: Home Health | Payer: Medicare HMO | Attending: Family Medicine | Admitting: Family Medicine

## 2020-03-26 ENCOUNTER — Other Ambulatory Visit: Payer: Self-pay

## 2020-03-26 DIAGNOSIS — R079 Chest pain, unspecified: Secondary | ICD-10-CM | POA: Diagnosis not present

## 2020-03-26 DIAGNOSIS — Z8249 Family history of ischemic heart disease and other diseases of the circulatory system: Secondary | ICD-10-CM | POA: Diagnosis not present

## 2020-03-26 DIAGNOSIS — R6520 Severe sepsis without septic shock: Secondary | ICD-10-CM | POA: Diagnosis not present

## 2020-03-26 DIAGNOSIS — E119 Type 2 diabetes mellitus without complications: Secondary | ICD-10-CM

## 2020-03-26 DIAGNOSIS — I272 Pulmonary hypertension, unspecified: Secondary | ICD-10-CM | POA: Diagnosis not present

## 2020-03-26 DIAGNOSIS — Z801 Family history of malignant neoplasm of trachea, bronchus and lung: Secondary | ICD-10-CM

## 2020-03-26 DIAGNOSIS — J9601 Acute respiratory failure with hypoxia: Secondary | ICD-10-CM | POA: Diagnosis not present

## 2020-03-26 DIAGNOSIS — I714 Abdominal aortic aneurysm, without rupture: Secondary | ICD-10-CM | POA: Diagnosis present

## 2020-03-26 DIAGNOSIS — R1310 Dysphagia, unspecified: Secondary | ICD-10-CM | POA: Diagnosis present

## 2020-03-26 DIAGNOSIS — I252 Old myocardial infarction: Secondary | ICD-10-CM | POA: Diagnosis not present

## 2020-03-26 DIAGNOSIS — J383 Other diseases of vocal cords: Secondary | ICD-10-CM | POA: Diagnosis present

## 2020-03-26 DIAGNOSIS — A419 Sepsis, unspecified organism: Secondary | ICD-10-CM | POA: Diagnosis not present

## 2020-03-26 DIAGNOSIS — Z7901 Long term (current) use of anticoagulants: Secondary | ICD-10-CM | POA: Diagnosis not present

## 2020-03-26 DIAGNOSIS — R0689 Other abnormalities of breathing: Secondary | ICD-10-CM | POA: Diagnosis not present

## 2020-03-26 DIAGNOSIS — Z8679 Personal history of other diseases of the circulatory system: Secondary | ICD-10-CM

## 2020-03-26 DIAGNOSIS — J189 Pneumonia, unspecified organism: Secondary | ICD-10-CM | POA: Diagnosis present

## 2020-03-26 DIAGNOSIS — Z8546 Personal history of malignant neoplasm of prostate: Secondary | ICD-10-CM

## 2020-03-26 DIAGNOSIS — I4821 Permanent atrial fibrillation: Secondary | ICD-10-CM | POA: Diagnosis present

## 2020-03-26 DIAGNOSIS — D509 Iron deficiency anemia, unspecified: Secondary | ICD-10-CM | POA: Diagnosis present

## 2020-03-26 DIAGNOSIS — E1151 Type 2 diabetes mellitus with diabetic peripheral angiopathy without gangrene: Secondary | ICD-10-CM | POA: Diagnosis not present

## 2020-03-26 DIAGNOSIS — Z833 Family history of diabetes mellitus: Secondary | ICD-10-CM | POA: Diagnosis not present

## 2020-03-26 DIAGNOSIS — K760 Fatty (change of) liver, not elsewhere classified: Secondary | ICD-10-CM | POA: Diagnosis present

## 2020-03-26 DIAGNOSIS — Z87891 Personal history of nicotine dependence: Secondary | ICD-10-CM

## 2020-03-26 DIAGNOSIS — I10 Essential (primary) hypertension: Secondary | ICD-10-CM | POA: Diagnosis present

## 2020-03-26 DIAGNOSIS — I517 Cardiomegaly: Secondary | ICD-10-CM | POA: Diagnosis not present

## 2020-03-26 DIAGNOSIS — I491 Atrial premature depolarization: Secondary | ICD-10-CM | POA: Diagnosis not present

## 2020-03-26 DIAGNOSIS — Z83438 Family history of other disorder of lipoprotein metabolism and other lipidemia: Secondary | ICD-10-CM

## 2020-03-26 DIAGNOSIS — R0789 Other chest pain: Secondary | ICD-10-CM

## 2020-03-26 DIAGNOSIS — K7460 Unspecified cirrhosis of liver: Secondary | ICD-10-CM

## 2020-03-26 DIAGNOSIS — I2510 Atherosclerotic heart disease of native coronary artery without angina pectoris: Secondary | ICD-10-CM | POA: Diagnosis present

## 2020-03-26 DIAGNOSIS — I25810 Atherosclerosis of coronary artery bypass graft(s) without angina pectoris: Secondary | ICD-10-CM | POA: Diagnosis not present

## 2020-03-26 DIAGNOSIS — Z20822 Contact with and (suspected) exposure to covid-19: Secondary | ICD-10-CM | POA: Diagnosis not present

## 2020-03-26 DIAGNOSIS — J811 Chronic pulmonary edema: Secondary | ICD-10-CM | POA: Diagnosis not present

## 2020-03-26 DIAGNOSIS — R0902 Hypoxemia: Secondary | ICD-10-CM

## 2020-03-26 DIAGNOSIS — K219 Gastro-esophageal reflux disease without esophagitis: Secondary | ICD-10-CM | POA: Diagnosis present

## 2020-03-26 DIAGNOSIS — R490 Dysphonia: Secondary | ICD-10-CM | POA: Diagnosis present

## 2020-03-26 DIAGNOSIS — J90 Pleural effusion, not elsewhere classified: Secondary | ICD-10-CM | POA: Diagnosis not present

## 2020-03-26 DIAGNOSIS — I4891 Unspecified atrial fibrillation: Secondary | ICD-10-CM | POA: Diagnosis present

## 2020-03-26 DIAGNOSIS — R590 Localized enlarged lymph nodes: Secondary | ICD-10-CM | POA: Diagnosis present

## 2020-03-26 DIAGNOSIS — E785 Hyperlipidemia, unspecified: Secondary | ICD-10-CM | POA: Diagnosis present

## 2020-03-26 DIAGNOSIS — Z95828 Presence of other vascular implants and grafts: Secondary | ICD-10-CM

## 2020-03-26 DIAGNOSIS — Z803 Family history of malignant neoplasm of breast: Secondary | ICD-10-CM | POA: Diagnosis not present

## 2020-03-26 LAB — CBG MONITORING, ED
Glucose-Capillary: 70 mg/dL (ref 70–99)
Glucose-Capillary: 118 mg/dL — ABNORMAL HIGH (ref 70–99)

## 2020-03-26 LAB — BASIC METABOLIC PANEL
Anion gap: 10 (ref 5–15)
BUN: 17 mg/dL (ref 8–23)
CO2: 20 mmol/L — ABNORMAL LOW (ref 22–32)
Calcium: 8.5 mg/dL — ABNORMAL LOW (ref 8.9–10.3)
Chloride: 107 mmol/L (ref 98–111)
Creatinine, Ser: 0.98 mg/dL (ref 0.61–1.24)
GFR, Estimated: >60 mL/min (ref >60)
Glucose, Bld: 153 mg/dL — ABNORMAL HIGH (ref 70–99)
Potassium: 3.8 mmol/L (ref 3.5–5.1)
Sodium: 137 mmol/L (ref 135–145)

## 2020-03-26 LAB — CBC WITH DIFFERENTIAL/PLATELET
Abs Immature Granulocytes: 0.04 10*3/uL (ref 0.00–0.07)
Basophils Absolute: 0.1 10*3/uL (ref 0.0–0.1)
Basophils Relative: 0 %
Eosinophils Absolute: 0.2 10*3/uL (ref 0.0–0.5)
Eosinophils Relative: 2 %
HCT: 30.5 % — ABNORMAL LOW (ref 39.0–52.0)
Hemoglobin: 8.9 g/dL — ABNORMAL LOW (ref 13.0–17.0)
Immature Granulocytes: 0 %
Lymphocytes Relative: 9 %
Lymphs Abs: 1.1 10*3/uL (ref 0.7–4.0)
MCH: 25.3 pg — ABNORMAL LOW (ref 26.0–34.0)
MCHC: 29.2 g/dL — ABNORMAL LOW (ref 30.0–36.0)
MCV: 86.6 fL (ref 80.0–100.0)
Monocytes Absolute: 0.9 10*3/uL (ref 0.1–1.0)
Monocytes Relative: 8 %
Neutro Abs: 9.4 10*3/uL — ABNORMAL HIGH (ref 1.7–7.7)
Neutrophils Relative %: 81 %
Platelets: 245 10*3/uL (ref 150–400)
RBC: 3.52 MIL/uL — ABNORMAL LOW (ref 4.22–5.81)
RDW: 18.1 % — ABNORMAL HIGH (ref 11.5–15.5)
WBC: 11.7 10*3/uL — ABNORMAL HIGH (ref 4.0–10.5)
nRBC: 0 % (ref 0.0–0.2)

## 2020-03-26 LAB — HEMOGLOBIN A1C
Hgb A1c MFr Bld: 6.2 % — ABNORMAL HIGH (ref 4.8–5.6)
Mean Plasma Glucose: 131.24 mg/dL

## 2020-03-26 LAB — GLUCOSE, CAPILLARY
Glucose-Capillary: 135 mg/dL — ABNORMAL HIGH (ref 70–99)
Glucose-Capillary: 122 mg/dL — ABNORMAL HIGH (ref 70–99)

## 2020-03-26 LAB — D-DIMER, QUANTITATIVE: D-Dimer, Quant: 1.01 ug/mL-FEU — ABNORMAL HIGH (ref 0.00–0.50)

## 2020-03-26 LAB — RESP PANEL BY RT-PCR (FLU A&B, COVID) ARPGX2
Influenza A by PCR: NEGATIVE
Influenza B by PCR: NEGATIVE
SARS Coronavirus 2 by RT PCR: NEGATIVE

## 2020-03-26 LAB — PROTIME-INR
INR: 3 — ABNORMAL HIGH (ref 0.8–1.2)
Prothrombin Time: 30.5 seconds — ABNORMAL HIGH (ref 11.4–15.2)

## 2020-03-26 LAB — TROPONIN I (HIGH SENSITIVITY)
Troponin I (High Sensitivity): 6 ng/L (ref <18)
Troponin I (High Sensitivity): 5 ng/L (ref <18)

## 2020-03-26 LAB — BRAIN NATRIURETIC PEPTIDE: B Natriuretic Peptide: 343 pg/mL — ABNORMAL HIGH (ref 0.0–100.0)

## 2020-03-26 MED ORDER — SODIUM CHLORIDE 0.9 % IV SOLN
2.0000 g | INTRAVENOUS | Status: DC
  Administered 2020-03-26 – 2020-03-29 (×4): 2 g via INTRAVENOUS
  Filled 2020-03-26 (×4): qty 20

## 2020-03-26 MED ORDER — SODIUM CHLORIDE 0.9 % IV SOLN: 1.0000 g | Freq: Once | INTRAVENOUS | Status: DC

## 2020-03-26 MED ORDER — TAMSULOSIN HCL 0.4 MG PO CAPS: 0.4000 mg | ORAL_CAPSULE | Freq: Every day | ORAL | Status: DC | PRN

## 2020-03-26 MED ORDER — METOPROLOL SUCCINATE ER 25 MG PO TB24
25.0000 mg | ORAL_TABLET | Freq: Every day | ORAL | Status: DC
  Administered 2020-03-26 – 2020-03-29 (×4): 25 mg via ORAL
  Filled 2020-03-26 (×4): qty 1

## 2020-03-26 MED ORDER — ACETAMINOPHEN 650 MG RE SUPP: 650.0000 mg | Freq: Four times a day (QID) | RECTAL | Status: DC | PRN

## 2020-03-26 MED ORDER — TRAZODONE HCL 50 MG PO TABS: 50.0000 mg | ORAL_TABLET | Freq: Every evening | ORAL | Status: DC | PRN

## 2020-03-26 MED ORDER — SODIUM CHLORIDE 0.9 % IV SOLN: 500.0000 mg | INTRAVENOUS | Status: DC

## 2020-03-26 MED ORDER — ASPIRIN EC 81 MG PO TBEC
81.0000 mg | DELAYED_RELEASE_TABLET | Freq: Every day | ORAL | Status: DC
  Administered 2020-03-26 – 2020-03-29 (×4): 81 mg via ORAL
  Filled 2020-03-26 (×4): qty 1

## 2020-03-26 MED ORDER — SODIUM CHLORIDE 0.9 % IV SOLN: 250.0000 mL | INTRAVENOUS | Status: DC | PRN

## 2020-03-26 MED ORDER — INSULIN ASPART 100 UNIT/ML ~~LOC~~ SOLN: 0.0000 [IU] | Freq: Every day | SUBCUTANEOUS | Status: DC

## 2020-03-26 MED ORDER — SODIUM CHLORIDE 0.9% FLUSH: 3.0000 mL | INTRAVENOUS | Status: DC | PRN

## 2020-03-26 MED ORDER — ONDANSETRON HCL 4 MG/2ML IJ SOLN: 4.0000 mg | Freq: Four times a day (QID) | INTRAMUSCULAR | Status: DC | PRN

## 2020-03-26 MED ORDER — ISOSORBIDE MONONITRATE ER 60 MG PO TB24
60.0000 mg | ORAL_TABLET | Freq: Every evening | ORAL | Status: DC
  Administered 2020-03-26 – 2020-03-29 (×4): 60 mg via ORAL
  Filled 2020-03-26 (×6): qty 1

## 2020-03-26 MED ORDER — NITROGLYCERIN 2 % TD OINT
1.0000 [in_us] | TOPICAL_OINTMENT | Freq: Once | TRANSDERMAL | Status: AC
  Administered 2020-03-26: 04:00:00 1 [in_us] via TOPICAL
  Filled 2020-03-26: qty 1

## 2020-03-26 MED ORDER — SODIUM CHLORIDE 0.9% FLUSH
3.0000 mL | Freq: Two times a day (BID) | INTRAVENOUS | Status: DC
  Administered 2020-03-26 – 2020-03-28 (×5): 3 mL via INTRAVENOUS

## 2020-03-26 MED ORDER — PROCHLORPERAZINE EDISYLATE 10 MG/2ML IJ SOLN: 5.0000 mg | INTRAMUSCULAR | Status: DC | PRN

## 2020-03-26 MED ORDER — POLYETHYLENE GLYCOL 3350 17 G PO PACK: 17.0000 g | PACK | Freq: Every day | ORAL | Status: DC | PRN

## 2020-03-26 MED ORDER — PANTOPRAZOLE SODIUM 40 MG PO TBEC
40.0000 mg | DELAYED_RELEASE_TABLET | Freq: Every day | ORAL | Status: DC
  Administered 2020-03-26 – 2020-03-29 (×4): 40 mg via ORAL
  Filled 2020-03-26 (×4): qty 1

## 2020-03-26 MED ORDER — AZITHROMYCIN 500 MG IV SOLR
500.0000 mg | INTRAVENOUS | Status: DC
  Administered 2020-03-26 – 2020-03-29 (×4): 500 mg via INTRAVENOUS
  Filled 2020-03-26 (×4): qty 500

## 2020-03-26 MED ORDER — MORPHINE SULFATE (PF) 4 MG/ML IV SOLN
4.0000 mg | Freq: Once | INTRAVENOUS | Status: AC
  Administered 2020-03-26: 06:00:00 4 mg via INTRAVENOUS
  Filled 2020-03-26: qty 1

## 2020-03-26 MED ORDER — HYDROCODONE-ACETAMINOPHEN 5-325 MG PO TABS
1.0000 | ORAL_TABLET | Freq: Every day | ORAL | Status: DC | PRN
Start: 2020-03-26 — End: 2020-03-29

## 2020-03-26 MED ORDER — ACETAMINOPHEN 325 MG PO TABS
650.0000 mg | ORAL_TABLET | Freq: Four times a day (QID) | ORAL | Status: DC | PRN
  Administered 2020-03-28: 650 mg via ORAL
  Filled 2020-03-26: qty 2

## 2020-03-26 MED ORDER — ALBUTEROL SULFATE (2.5 MG/3ML) 0.083% IN NEBU: 2.5000 mg | INHALATION_SOLUTION | RESPIRATORY_TRACT | Status: DC | PRN

## 2020-03-26 MED ORDER — BISACODYL 10 MG RE SUPP: 10.0000 mg | Freq: Every day | RECTAL | Status: DC | PRN

## 2020-03-26 MED ORDER — NITROGLYCERIN 0.4 MG SL SUBL: 0.4000 mg | SUBLINGUAL_TABLET | SUBLINGUAL | Status: DC | PRN

## 2020-03-26 MED ORDER — INSULIN ASPART 100 UNIT/ML ~~LOC~~ SOLN
0.0000 [IU] | Freq: Three times a day (TID) | SUBCUTANEOUS | Status: DC
  Administered 2020-03-26 – 2020-03-28 (×4): 2 [IU] via SUBCUTANEOUS

## 2020-03-26 MED ORDER — ONDANSETRON HCL 4 MG PO TABS: 4.0000 mg | ORAL_TABLET | Freq: Four times a day (QID) | ORAL | Status: DC | PRN

## 2020-03-26 MED ORDER — AMLODIPINE BESYLATE 5 MG PO TABS
10.0000 mg | ORAL_TABLET | Freq: Every evening | ORAL | Status: DC
  Administered 2020-03-26 – 2020-03-29 (×4): 10 mg via ORAL
  Filled 2020-03-26 (×4): qty 2

## 2020-03-26 MED ORDER — LORAZEPAM 0.5 MG PO TABS
0.5000 mg | ORAL_TABLET | Freq: Once | ORAL | Status: AC
  Administered 2020-03-26: 04:00:00 0.5 mg via ORAL
  Filled 2020-03-26: qty 1

## 2020-03-26 MED ORDER — SODIUM CHLORIDE 0.9% FLUSH
3.0000 mL | Freq: Two times a day (BID) | INTRAVENOUS | Status: DC
  Administered 2020-03-26 – 2020-03-29 (×7): 3 mL via INTRAVENOUS

## 2020-03-26 MED ORDER — IPRATROPIUM-ALBUTEROL 0.5-2.5 (3) MG/3ML IN SOLN
3.0000 mL | Freq: Four times a day (QID) | RESPIRATORY_TRACT | Status: DC
  Administered 2020-03-26 – 2020-03-29 (×13): 3 mL via RESPIRATORY_TRACT
  Filled 2020-03-26 (×12): qty 3

## 2020-03-26 MED ORDER — GUAIFENESIN ER 600 MG PO TB12
600.0000 mg | ORAL_TABLET | Freq: Two times a day (BID) | ORAL | Status: DC
  Administered 2020-03-26 – 2020-03-29 (×7): 600 mg via ORAL
  Filled 2020-03-26 (×7): qty 1

## 2020-03-26 MED ORDER — IOHEXOL 350 MG/ML SOLN
100.0000 mL | Freq: Once | INTRAVENOUS | Status: AC | PRN
  Administered 2020-03-26: 05:00:00 100 mL via INTRAVENOUS

## 2020-03-26 MED ORDER — ACETAMINOPHEN 325 MG PO TABS: 650.0000 mg | ORAL_TABLET | Freq: Four times a day (QID) | ORAL | Status: DC | PRN

## 2020-03-26 MED ORDER — ATORVASTATIN CALCIUM 40 MG PO TABS
40.0000 mg | ORAL_TABLET | Freq: Every evening | ORAL | Status: DC
  Administered 2020-03-26 – 2020-03-29 (×4): 40 mg via ORAL
  Filled 2020-03-26 (×4): qty 1

## 2020-03-26 NOTE — Progress Notes (Addendum)
ANTICOAGULATION CONSULT NOTE - Initial Consult  Pharmacy Consult for warfarin Indication: atrial fibrillation  Allergies  Allergen Reactions  . Niacin Itching and Rash    Burning sensation  . Iodine-131 Nausea And Vomiting  . Nsaids Other (See Comments)    Taking Coumadin    Patient Measurements: Height: 5\' 10"  (177.8 cm) Weight: 97.1 kg (214 lb 1.1 oz) IBW/kg (Calculated) : 73 Heparin Dosing Weight:   Vital Signs: Temp: 98.3 F (36.8 C) (12/25 0330) Temp Source: Oral (12/25 0330) BP: 107/53 (12/25 0900) Pulse Rate: 60 (12/25 0900)  Labs: Recent Labs    03/26/20 0336 03/26/20 0612  HGB 8.9*  --   HCT 30.5*  --   PLT 245  --   LABPROT 30.5*  --   INR 3.0*  --   CREATININE 0.98  --   TROPONINIHS 6 5    Estimated Creatinine Clearance: 73.8 mL/min (by C-G formula based on SCr of 0.98 mg/dL).   Medical History: Past Medical History:  Diagnosis Date  . AAA (abdominal aortic aneurysm) ([place])    Followed by Dr. Curt Jews  . Arthritis   . CAD (coronary artery disease)    a. CABG 2000.  [name] Cancer [facility])    Prostate:  Radiation Tx  . Carotid artery disease ([place])   . Chest pain    precordial. mild chronic .[name]... nonischemic  . Chronic edema   . Coronary artery disease    a. Nuclear, January, 2008, no ischemia b. Cath 08/2012- 1/4 patent grafts, RCA CTO, no flow-limiting disease, medically managed  . Diabetes mellitus without complication ([place])   . Dizziness 02/2011  . Dyslipidemia   . GERD (gastroesophageal reflux disease)    TAKES TUMS & ROLAIDS AS NEEDED  . Hx of CABG 2000  . Hypertension   . Myocardial infarction ([place]) 1999  . Neck pain 02/2011  . Pneumonia   . Pulmonary hypertension ([place])   . Renal artery stenosis ([facility])    50-70%  . S/P femoropopliteal bypass surgery    Dr. Donnetta Hutching  . Sinus bradycardia    Asymptomatic    Medications:  (Not in a hospital admission)   Assessment: Pharmacy consulted to dose warfarin in patient with atrial  fibrillation.  Patient's INR on admission is 3.0 with last dose given 12/24 .  Home dose listed as 2 mg daily.  Goal of Therapy:  INR 2-3 Monitor platelets by anticoagulation protocol: Yes   Plan:  Hold warfarin x 1 dose Monitor daily INR and s/s of bleeding.  Margot Ables, PharmD Clinical Pharmacist 03/26/2020 10:37 AM

## 2020-03-26 NOTE — Progress Notes (Signed)
ANTICOAGULATION CONSULT NOTE - Initial Consult  Pharmacy Consult for Coumadin Indication: atrial fibrillation  Allergies  Allergen Reactions  . Niacin Itching and Rash    Burning sensation  . Iodine-131 Nausea And Vomiting  . Nsaids Other (See Comments)    Taking Coumadin    Patient Measurements: Height: 5\' 10"  (177.8 cm) Weight: 97.1 kg (214 lb 1.1 oz) IBW/kg (Calculated) : 73  Vital Signs: Temp: 98.3 F (36.8 C) (12/25 0330) Temp Source: Oral (12/25 0330) BP: 129/58 (12/25 0600) Pulse Rate: 65 (12/25 0600)  Labs: Recent Labs    03/26/20 0336  HGB 8.9*  HCT 30.5*  PLT 245  CREATININE 0.98  TROPONINIHS 6    Estimated Creatinine Clearance: 73.8 mL/min (by C-G formula based on SCr of 0.98 mg/dL).   Medical History: Past Medical History:  Diagnosis Date  . AAA (abdominal aortic aneurysm) ([place])    Followed by Dr. Curt Jews  . Arthritis   . CAD (coronary artery disease)    a. CABG 2000.  [name] Cancer [facility])    Prostate:  Radiation Tx  . Carotid artery disease ([place])   . Chest pain    precordial. mild chronic .[name]... nonischemic  . Chronic edema   . Coronary artery disease    a. Nuclear, January, 2008, no ischemia b. Cath 08/2012- 1/4 patent grafts, RCA CTO, no flow-limiting disease, medically managed  . Diabetes mellitus without complication ([place])   . Dizziness 02/2011  . Dyslipidemia   . GERD (gastroesophageal reflux disease)    TAKES TUMS & ROLAIDS AS NEEDED  . Hx of CABG 2000  . Hypertension   . Myocardial infarction ([place]) 1999  . Neck pain 02/2011  . Pneumonia   . Pulmonary hypertension ([place])   . Renal artery stenosis ([facility])    50-70%  . S/P femoropopliteal bypass surgery    Dr. Donnetta Hutching  . Sinus bradycardia    Asymptomatic    Assessment: 79yo male c/o CP, found to have PNA, to continue Coumadin for Afib during admission.  Goal of Therapy:  INR 2-3   Plan:  Will await INR prior to dosing Coumadin.  Wynona Neat, PharmD, BCPS  03/26/2020,6:31  AM

## 2020-03-26 NOTE — H&P (Signed)
Patient Demographics:    Troy [name], is a 79 y.o. 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), DM2,  prostate CA, and esophageal stricture requiring dilatation , as well as 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, dysphagia with oropharyngeal and UES dysfunction with prior dilatation of his UES presents with cough and right-sided chest discomfort and found to have right-sided pneumonia   A/p 1)Rt Sided PNA-- pt with H/o 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, dysphagia with oropharyngeal and UES dysfunction ---He has h/o recurrent aspiration PNA -Treat empirically with Riocephin/Azith, will consider dc home on Augmentin once he improves from resp standpoint =-Continue bronchodilators and mucolytics --WBC is 11.7   2)Severe Pulmonary HTN (PASP is 75 mmhg)--- CTA chest w/o pulm embolism -Low clinical index of suspicion for recurrent  Pulm embolism (on coumadin)  , will PFTs and outpt advance Heart Failure Cardiology follow-up with Dr Marigene Ehlers --BNP is elevated at 343, no baseline available - 3)H/o CAD--s/p prior CABG in 2000, low clinical index of suspicion for ACS Last LHC in 2017 with single-vessel disease at that time, medical management was advised at that time -Continue aspirin and Lipitor, as well as isosorbide and metoprolol -Troponin x2 is 6 and 5 respectively  4)Dysphonia and dysphagia--- patient sees ENT at [facility]  his dysphonia is due to right vocal fold immobility -   His Dysphagia is due to- oropharyngeal and UES dysfunction-status post prior UES dilatation -patient with history of recurrent aspiration.  5)Permanent atrial fibrillation--continue Coumadin for anticoagulation, metoprolol for rate control  6) essential HTN--stable, continue amlodipine 10 mg daily, metoprolol and isosorbide as ordered  7)DM2 -recent A1c, previously well controlled use Novolog/Humalog Sliding scale insulin with Accu-Cheks/Fingersticks as ordered   8)PAD/PVD--  with prior fem-pop bypass grafting, infrarenal abdominal aortic aneurysm (4.7cm 10/2019), prior renal artery stenosis, carotid artery stenosis----continue Coumadin therapy and atorvastatin  9) Mediastinal and bilateral hilar lymphadenopathy, potentially reactive. Metastatic disease or lymphoma not excluded--- Follow-up CT in 3 months recommended to further evaluate.  10)Hepatic steatosis--- suspect liver cirrhosis --CT suggest  Subtle nodular liver  contour suggests cirrhosis. 2.6 cm low-density lesion in the left liver dome cannot be definitively characterized.  -Liver ultrasound requested, may need abdominal MRI as outpatient.  11)Acute on chronic anemia hemoglobin 8.9 which is lower than previous baseline -Check stool occult blood, serum iron, ferritin, TIBC folate and B12  12) acute hypoxic respiratory failure--- secondary to #1 above, #2 is probably contributory,  patient may need home O2 at discharge -Currently requiring 4 L of nasal cannula  13) sepsis secondary to right-sided pneumonia--patient met sepsis criteria on admission with leukocytosis with WBC of 11.7, tachypnea with respiratory rate of 23, as well as hypoxia requiring up to 4 L of oxygen via nasal cannula -Treat as above #1 pending blood cultures -No tachycardia most likely because patient is on metoprolol  Disposition/Need for in-Hospital Stay- patient unable to be discharged at this time due to --  Status is: Inpatient  Remains inpatient appropriate because:Pneumonia requiring IV antibiotics, acute hypoxic respiratory failure requiring submental oxygen and further work-up   Dispo: The patient is from: Home              Anticipated d/c is to: Home              Anticipated d/c date is: 2 days              Patient currently is not medically stable to d/c. Barriers: Not Clinically Stable-    With History of - Reviewed by me  Past Medical History:  Diagnosis Date  . AAA (abdominal aortic aneurysm) ([place])    Followed by Dr. Curt Jews  . Arthritis   . CAD (coronary artery disease)    a. CABG 2000.  [name])    Prostate:  Radiation Tx  . Carotid artery disease ([facility])   . Chest pain    precordial. mild chronic .[name]... nonischemic  . Chronic edema   . Coronary artery disease    a. Nuclear, January, 2008, no ischemia b. Cath 08/2012- 1/4 patent grafts, RCA CTO, no flow-limiting disease, medically managed  . Diabetes mellitus without complication ([place])   . Dizziness 02/2011  . Dyslipidemia   . GERD (gastroesophageal reflux disease)    TAKES TUMS & ROLAIDS AS NEEDED  . Hx of CABG 2000  . Hypertension   . Myocardial infarction ([place]) 1999  . Neck pain 02/2011  . Pneumonia   . Pulmonary hypertension ([place])   . Renal artery stenosis (HCC)    50-70%  . S/P femoropopliteal bypass surgery    Dr. Donnetta Hutching  . Sinus bradycardia    Asymptomatic      Past Surgical History:   Procedure Laterality Date  . CARDIAC CATHETERIZATION  09/26/2012   1/4 patent bypass (occluded SVG-PDA, SVG-OM, LIMA-LAD), SVG-diagonal patent and fills the diagonal and LAD, distal RCA occlusion with left to right collateralization, patent circumflex, LAD with no flow-limiting disease and antegrade flow competitively from SVG-diagonal; EF 60-65%  . COLONOSCOPY  11/30/2009   QPY:[id number]. next TCS 11/2019  . COLONOSCOPY WITH PROPOFOL N/A 12/18/2019   Procedure: COLONOSCOPY WITH PROPOFOL;  Surgeon: Harvel Quale, MD;  [facility];  Service: Gastroenterology;  Laterality: N/A;  1030  . CORONARY ARTERY BYPASS GRAFT  2000  . ESOPHAGOGASTRODUODENOSCOPY N/A 08/12/2013   KDX:[id number] peptic stricture with erosive refluxesophagitis - status post Maloney dilation. Hiatal hernia. Abnormalgastric mucosa. Deformity of the pyloric channel suggestive ofprior peptic ulcer disease. Duodenal bulbar diverticulum Statuspost gastric biopsy. h.pylori  . LEFT HEART CATHETERIZATION WITH CORONARY/GRAFT ANGIOGRAM N/A  09/26/2012   Procedure: LEFT HEART CATHETERIZATION WITH Beatrix Fetters;  Surgeon: Burnell Blanks, MD;  Location: [facility] CATH LAB;  Service: Cardiovascular;  Laterality: N/A;  . Venia Minks DILATION N/A 08/12/2013   Procedure: Venia Minks DILATION;  Surgeon: Daneil Dolin, MD;  Location: [facility];  Service: Endoscopy;  Laterality: N/A;  . POLYPECTOMY  12/18/2019   Procedure: POLYPECTOMY;  Surgeon: Harvel Quale, MD;  Location: [facility];  Service: Gastroenterology;;  ascending colon polyp   . PR VEIN BYPASS GRAFT,AORTO-FEM-POP Right 07/19/1999  . PR VEIN BYPASS GRAFT,AORTO-FEM-POP Left 05/02/2006  . SAVORY DILATION N/A 08/12/2013   Procedure: SAVORY DILATION;  Surgeon: Daneil Dolin, MD;  Location: [facility];  Service: Endoscopy;  Laterality: N/A;  . WRIST SURGERY     cyst removal    Chief Complaint  Patient presents with  . Chest Pain       HPI:    Troy [name]  is a 79 y.o. 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, dysphagia with oropharyngeal and UES dysfunction with prior dilatation of his UES presents with cough and right-sided chest discomfort and found to have right-sided pneumonia  --Presented to the ED with right-sided chest discomfort and shortness of breath, as well as cough -Patient stated that the right-sided chest pain actually woke him up from sleep, he actually took aspirin and nitroglycerin x2 with some relief -He never had left-sided chest pains, no pleuritic symptoms he has chronic leg swelling but no new leg pains -In the ED he was found to be hypoxic with O2 sats of 85% on room air required up to 4 L of oxygen  --Additional history obtained from daughter at bedside -patient is fully vaccinated against COVID-19, he also got his Moderna booster No fever  Or chills  - In ED--CT chest without acute PE, patient is found to have right-sided pneumonia small to moderate right-sided pleural effusion and with mediastinal bilateral hilar lymphadenopathy, as well as hepatic steatosis and concern for liver cirrhosis In ED--BNP is elevated at 343, no baseline available -WBC is 11.7 hemoglobin 8.9 which is lower than previous baseline -Troponin x2 is 6 and 5 respectively -Blood cultures obtained and IV antibiotics administered    Review of systems:    In addition to the HPI above,   A full Review of  Systems was done, all other systems reviewed are negative except as  noted above in HPI , .    Social History:  Reviewed by me    Social History   Tobacco Use  . Smoking status: Former Smoker    Quit date: 04/02/1998    Years since quitting: 21.9  . Smokeless tobacco: Never Used  Substance Use Topics  . Alcohol use: Yes    Alcohol/week: 0.0 - 2.0 standard drinks    Comment: OCCASIONAL       Family History :  Reviewed by me    Family History  Problem Relation Age of Onset  . Deep vein thrombosis Father   . Lung cancer Sister   . Diabetes Sister   . Heart disease Sister  After age 29  . Hyperlipidemia Sister   . Hypertension Sister   . Lung cancer Sister   . Breast cancer Sister   . Hypertension Mother   . Diabetes Sister   . Coronary artery disease Other        family hx of  . Cancer Brother        "crab cancer"  . Heart disease Brother   . Heart attack Brother   . Heart attack Daughter   . Colon cancer Neg Hx   . Stroke Neg Hx      Home Medications:   Prior to Admission medications   Medication Sig Start Date End Date Taking? Authorizing Provider  amLODipine (NORVASC) 10 MG tablet Take 10 mg by mouth every evening.   Yes [provider]  aspirin EC 81 MG EC tablet Take 1 tablet (81 mg total) by mouth daily. Patient taking differently: Take 81 mg by mouth every evening. 09/27/12  Yes Arguello, Roger A, PA-C  atorvastatin (LIPITOR) 40 MG tablet Take 1 tablet (40 mg total) by mouth daily. Patient taking differently: Take 40 mg by mouth every evening. 11/30/13  Yes Carlena Bjornstad, MD  HYDROcodone-acetaminophen (NORCO/VICODIN) 5-325 MG tablet Take 1 tablet by mouth daily as needed for moderate pain.   Yes [provider]  ipratropium (ATROVENT) 0.03 % nasal spray Place 2 sprays into both nostrils 2 (two) times daily. 06/01/19  Yes [provider]  isosorbide mononitrate (IMDUR) 60 MG 24 hr tablet Take 60 mg by mouth every evening.  10/16/19  Yes [provider]  losartan (COZAAR) 100 MG  tablet Take 100 mg by mouth daily.   Yes [provider]  metFORMIN (GLUCOPHAGE) 1000 MG tablet Take 1 tablet (1,000 mg total) by mouth 2 (two) times daily with a meal. Patient taking differently: Take 1,000 mg by mouth 2 (two) times daily with a meal. Pt states he will take twice daily if he remembers. Most of the time he takes 1 tab pm. 09/28/12  Yes Arguello, Roger A, PA-C  metoprolol succinate (TOPROL XL) 25 MG 24 hr tablet Take 1 tablet (25 mg total) by mouth daily. 12/14/19  Yes Dunn, Dayna N, PA-C  nitroGLYCERIN (NITROSTAT) 0.4 MG SL tablet Place 1 tablet (0.4 mg total) under the tongue every 5 (five) minutes x 3 doses as needed for chest pain. 12/14/19  Yes Dunn, Dayna N, PA-C  pantoprazole (PROTONIX) 40 MG tablet TAKE 1 TABLET EVERY DAY Patient taking differently: Take 40 mg by mouth every evening. 12/10/14  Yes Carlis Stable, NP  potassium chloride SA (KLOR-CON) 20 MEQ tablet Take on days you need to take demadex Patient taking differently: Take 20 mEq by mouth daily as needed (weight gain over 3 lbs in 24 hours w/torsemide). 07/03/19  Yes Herminio Commons, MD  torsemide (DEMADEX) 20 MG tablet Take 20 mg for weight gain over 3 lbs in 24 hours Patient taking differently: Take 20 mg by mouth daily as needed (weight gain over 3 lbs in 24 hours). 07/03/19  Yes Herminio Commons, MD  warfarin (COUMADIN) 2 MG tablet Take 2 mg by mouth every evening. MANAGED BY PMD 09/27/12  Yes Arguello, Roger A, PA-C  polyethylene glycol-electrolytes (NULYTELY) 420 g solution Take 4,000 mLs by mouth once.  Patient not taking: Reported on 03/26/2020 11/21/19   [provider]  tamsulosin (FLOMAX) 0.4 MG CAPS capsule Take 0.4 mg by mouth daily as needed (bladder issues.).  Patient not taking: Reported on  03/26/2020 03/08/16   [provider]  tetrahydrozoline-zinc (VISINE-AC) 0.05-0.25 % ophthalmic solution 2 drops 3 (three) times daily as needed. Patient not taking: Reported on 03/26/2020     [provider]     Allergies:     Allergies  Allergen Reactions  . Niacin Itching and Rash    Burning sensation  . Iodine-131 Nausea And Vomiting  . Nsaids Other (See Comments)    Taking Coumadin     Physical Exam:   Vitals  Blood pressure 121/64, pulse (!) 54, temperature 98 F (36.7 C), resp. rate 19, height $RemoveBe'5\' 10"'OfTdkBwHr$  (1.778 m), weight 97.1 kg, SpO2 90 %.  Physical Examination: General appearance - alert, dyspnea on exertion noted  mental status - alert, oriented to person, place, and time,  Nose- [place] 4L/min Eyes - sclera anicteric Neck - supple, no JVD elevation , Chest -diminished breath sounds with scattered rhonchi on the right, no wheezing Heart - S1 and S2 normal, irregular  Abdomen - soft, nontender, nondistended, no masses or organomegaly Neurological - screening mental status exam normal, neck supple without rigidity, cranial nerves II through XII intact, DTR's normal and symmetric Extremities bilateral lower extremity edema left more than right, intact peripheral pulses  Skin - warm, dry     Data Review:    CBC Recent Labs  Lab 03/26/20 0336  WBC 11.7*  HGB 8.9*  HCT 30.5*  PLT 245  MCV 86.6  MCH 25.3*  MCHC 29.2*  RDW 18.1*  LYMPHSABS 1.1  MONOABS 0.9  EOSABS 0.2  BASOSABS 0.1   ------------------------------------------------------------------------------------------------------------------  Chemistries  Recent Labs  Lab 03/26/20 0336  NA 137  K 3.8  CL 107  CO2 20*  GLUCOSE 153*  BUN 17  CREATININE 0.98  CALCIUM 8.5*   ------------------------------------------------------------------------------------------------------------------ estimated creatinine clearance is 73.8 mL/min (by C-G formula based on SCr of 0.98 mg/dL). ------------------------------------------------------------------------------------------------------------------ No results for input(s): TSH, T4TOTAL, T3FREE, THYROIDAB in the last 72 hours.  Invalid  input(s): FREET3   Coagulation profile Recent Labs  Lab 03/26/20 0336  INR 3.0*   ------------------------------------------------------------------------------------------------------------------- Recent Labs    03/26/20 0336  DDIMER 1.01*   -------------------------------------------------------------------------------------------------------------------  Cardiac Enzymes No results for input(s): CKMB, TROPONINI, MYOGLOBIN in the last 168 hours.  Invalid input(s): CK ------------------------------------------------------------------------------------------------------------------    Component Value Date/Time   BNP 343.0 (H) 03/26/2020 0334     ---------------------------------------------------------------------------------------------------------------  Urinalysis No results found for: COLORURINE, APPEARANCEUR, LABSPEC, PHURINE, GLUCOSEU, HGBUR, BILIRUBINUR, KETONESUR, PROTEINUR, UROBILINOGEN, NITRITE, LEUKOCYTESUR  ----------------------------------------------------------------------------------------------------------------   Imaging Results:    CT Angio Chest PE W and/or Wo Contrast  Result Date: 03/26/2020 CLINICAL DATA:  Chest pain. EXAM: CT ANGIOGRAPHY CHEST WITH CONTRAST TECHNIQUE: Multidetector CT imaging of the chest was performed using the standard protocol during bolus administration of intravenous contrast. Multiplanar CT image reconstructions and MIPs were obtained to evaluate the vascular anatomy. CONTRAST:  118mL OMNIPAQUE IOHEXOL 350 MG/ML SOLN COMPARISON:  None. FINDINGS: Cardiovascular: Heart is enlarged. Status post CABG. Atherosclerotic calcification is noted in the wall of the thoracic aorta. Enlargement of the pulmonary outflow tract and main pulmonary arteries suggests pulmonary arterial hypertension. There is no filling defect within the opacified pulmonary arteries to suggest the presence of an acute pulmonary embolus. Mediastinum/Nodes: Asymmetry  of the vocal cords may be related to right focal cord paralysis. Mediastinal lymphadenopathy noted with 1.7 cm short axis right paratracheal node on 40/4. 1.1 cm AP window lymph node identified on 39/4. 1.8 cm short axis subcarinal node on 49/4. 1.4 cm short  axis right hilar node is associated with 1.6 cm short axis left hilar node on 53/4. The esophagus has normal imaging features. There is no axillary lymphadenopathy. Lungs/Pleura: Fine architectural detail the lungs is obscured by breathing motion. Centrilobular and paraseptal emphysema evident. Volume loss and airspace consolidation noted right middle lobe and posterior right lower lobe. There is a small to moderate right pleural effusion associated. Subtle patchy ground-glass attenuation is noted in the left upper lobe. No overtly suspicious nodule or mass. Upper Abdomen: The liver shows diffusely decreased attenuation suggesting fat deposition. Subtle nodular liver contour raises the question of cirrhosis. Subcapsular low-density lesion in the left liver measuring 2.6 cm at the dome cannot be definitively characterized. Musculoskeletal: No worrisome lytic or sclerotic osseous abnormality. Review of the MIP images confirms the above findings. IMPRESSION: 1. No CT evidence for acute pulmonary embolus. 2. Cardiomegaly. Enlargement of the pulmonary outflow tract and main pulmonary arteries suggests pulmonary arterial hypertension. 3. Volume loss and airspace consolidation in the right middle lobe and posterior right lower lobe. Imaging features in the right middle lobe are highly suggestive of infection. Right lower lobe airspace disease may be related to atelectasis or pneumonia. May be related to atelectasis and/or pneumonia. Associated small to moderate right pleural effusion. 4. Mediastinal and bilateral hilar lymphadenopathy, potentially reactive. Metastatic disease or lymphoma not excluded. Follow-up CT in 3 months recommended to further evaluate. 5. Hepatic  steatosis. Subtle nodular liver contour suggests cirrhosis. 2.6 cm low-density lesion in the left liver dome cannot be definitively characterized. When the patient is clinically stable and able to follow directions and hold their breath further evaluation with dedicated abdominal MRI should be considered. 6. Aortic Atherosclerosis (ICD10-I70.0) and Emphysema (ICD10-J43.9). Electronically Signed   By: Misty Stanley M.D.   On: 03/26/2020 05:40   DG Chest Portable 1 View  Result Date: 03/26/2020 CLINICAL DATA:  Chest pain EXAM: PORTABLE CHEST 1 VIEW COMPARISON:  04/24/2018 FINDINGS: 0430 hours. The cardio pericardial silhouette is enlarged. There is pulmonary vascular congestion without overt pulmonary edema. Interstitial markings are diffusely coarsened with chronic features. Subtle patchy airspace disease noted right base. Bones are demineralized. Telemetry leads overlie the chest. IMPRESSION: 1. Enlargement of the cardiopericardial silhouette with pulmonary vascular congestion. 2. Subtle patchy airspace disease at the right base suspicious for pneumonia 3. Right pleural effusion. Electronically Signed   By: Misty Stanley M.D.   On: 03/26/2020 05:41    Radiological Exams on Admission: CT Angio Chest PE W and/or Wo Contrast  Result Date: 03/26/2020 CLINICAL DATA:  Chest pain. EXAM: CT ANGIOGRAPHY CHEST WITH CONTRAST TECHNIQUE: Multidetector CT imaging of the chest was performed using the standard protocol during bolus administration of intravenous contrast. Multiplanar CT image reconstructions and MIPs were obtained to evaluate the vascular anatomy. CONTRAST:  139m OMNIPAQUE IOHEXOL 350 MG/ML SOLN COMPARISON:  None. FINDINGS: Cardiovascular: Heart is enlarged. Status post CABG. Atherosclerotic calcification is noted in the wall of the thoracic aorta. Enlargement of the pulmonary outflow tract and main pulmonary arteries suggests pulmonary arterial hypertension. There is no filling defect within the  opacified pulmonary arteries to suggest the presence of an acute pulmonary embolus. 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 Mediastinal lymphadenopathy noted with 1.7 cm short axis right paratracheal node on 40/4. 1.1 cm AP window lymph node identified on 39/4. 1.8 cm short axis subcarinal node on 49/4. 1.4 cm short axis right hilar node is associated with 1.6 cm short axis left hilar  node on 53/4. The esophagus has normal imaging features. There is no axillary lymphadenopathy. Lungs/Pleura: Fine architectural detail the lungs is obscured by breathing motion. Centrilobular and paraseptal emphysema evident. Volume loss and airspace consolidation noted right middle lobe and posterior right lower lobe. There is a small to moderate right pleural effusion associated. Subtle patchy ground-glass attenuation is noted in the left upper lobe. No overtly suspicious nodule or mass. Upper Abdomen: The liver shows diffusely decreased attenuation suggesting fat deposition. Subtle nodular liver contour raises the question of cirrhosis. Subcapsular low-density lesion in the left liver measuring 2.6 cm at the dome cannot be definitively characterized. Musculoskeletal: No worrisome lytic or sclerotic osseous abnormality. Review of the MIP images confirms the above findings. IMPRESSION: 1. No CT evidence for acute pulmonary embolus. 2. Cardiomegaly. Enlargement of the pulmonary outflow tract and main pulmonary arteries suggests pulmonary arterial hypertension. 3. Volume loss and airspace consolidation in the right middle lobe and posterior right lower lobe. Imaging features in the right middle lobe are highly suggestive of infection. Right lower lobe airspace disease may be related to atelectasis or pneumonia. May be related to atelectasis and/or pneumonia. Associated small to moderate right pleural effusion. 4. Mediastinal and bilateral hilar lymphadenopathy, potentially reactive.  Metastatic disease or lymphoma not excluded. Follow-up CT in 3 months recommended to further evaluate. 5. Hepatic steatosis. Subtle nodular liver contour suggests cirrhosis. 2.6 cm low-density lesion in the left liver dome cannot be definitively characterized. When the patient is clinically stable and able to follow directions and hold their breath further evaluation with dedicated abdominal MRI should be considered. 6. Aortic Atherosclerosis (ICD10-I70.0) and Emphysema (ICD10-J43.9). Electronically Signed   By: Misty Stanley M.D.   On: 03/26/2020 05:40   DG Chest Portable 1 View  Result Date: 03/26/2020 CLINICAL DATA:  Chest pain EXAM: PORTABLE CHEST 1 VIEW COMPARISON:  04/24/2018 FINDINGS: 0430 hours. The cardio pericardial silhouette is enlarged. There is pulmonary vascular congestion without overt pulmonary edema. Interstitial markings are diffusely coarsened with chronic features. Subtle patchy airspace disease noted right base. Bones are demineralized. Telemetry leads overlie the chest. IMPRESSION: 1. Enlargement of the cardiopericardial silhouette with pulmonary vascular congestion. 2. Subtle patchy airspace disease at the right base suspicious for pneumonia 3. Right pleural effusion. Electronically Signed   By: Misty Stanley M.D.   On: 03/26/2020 05:41    DVT Prophylaxis -SCD/couamdin AM Labs Ordered, also please review Full Orders  Family Communication: Admission, patients condition and plan of care including tests being ordered have been discussed with the patient and daughter who indicate understanding and agree with the plan   Code Status - Full Code  Likely DC to home after improvement I PNA  Condition   stable*  Roxan Hockey M.D on 03/26/2020 at 6:28 PM Go to [email or web address] -  for contact info  [facility] - Office  [phone]

## 2020-03-26 NOTE — ED Provider Notes (Signed)
[facility] EMERGENCY DEPARTMENT Provider Note   CSN: [id number] Arrival date & time: 03/26/20  [id number]     History Chief Complaint  Patient presents with  . Chest Pain    Troy [name] is a 79 y.o. male.  HPI     This is a 79 year old male with a history of AAA, coronary artery disease status post CABG, pulmonary hypertension, atrial fibrillation who presents with chest pain.  Patient reports that he is woken from sleep with right-sided chest pain.  It is anterior and radiates into the right arm and shoulder.  He describes it as tightness.  Current pain is 4 out of 10.  Pain did improve with nitroglycerin.  States he felt fine when he went to bed.  No shortness of breath or fever.  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  States he has similar tightness on the left side when he had his CABG.  Per EMS, patient's O2 sats were in the mid 80s upon arrival to his house.  He was placed on 4 L.  Past Medical History:  Diagnosis Date  . AAA (abdominal aortic aneurysm) ([place])    Followed by Dr. Curt Jews  . Arthritis   . CAD (coronary artery disease)    a. CABG 2000.  [name] [facility])    Prostate:  Radiation Tx  . Carotid artery disease ([place])   . Chest pain    precordial. mild chronic .[name]... nonischemic  . Chronic edema   . Coronary artery disease    a. Nuclear, January, 2008, no ischemia b. Cath 08/2012- 1/4 patent grafts, RCA CTO, no flow-limiting disease, medically managed  . Diabetes mellitus without complication ([place])   . Dizziness 02/2011  . Dyslipidemia   . GERD (gastroesophageal reflux disease)    TAKES TUMS & ROLAIDS AS NEEDED  . Hx of CABG 2000  . Hypertension   . Myocardial infarction ([place]) 1999  . Neck pain 02/2011  . Pneumonia   . Pulmonary hypertension ([place])   . Renal artery stenosis ([facility])    50-70%  . S/P femoropopliteal bypass surgery    Dr. Donnetta Hutching  . Sinus bradycardia    Asymptomatic    Patient Active Problem List   Diagnosis Date Noted  .  Atrial fibrillation ([place]) 12/24/2014  . Bowel habit changes 09/16/2014  . Erosive esophagitis 02/11/2014  . Abdominal aneurysm without mention of rupture 07/28/2013  . Peripheral vascular disease, unspecified ([place]) 07/28/2013  . Esophageal dysphagia 07/21/2013  . CAD (coronary artery disease)   . Type 2 diabetes mellitus ([place]) 09/27/2012  . Hypertension 09/27/2012  . Atherosclerosis of native arteries of the extremities with intermittent claudication 02/12/2012  . Limb swelling 02/12/2012  . 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 Neck pain   . Dizziness   . Carotid artery disease ([place])   . Renal artery stenosis ([place])   . S/P femoropopliteal bypass surgery   . Ejection fraction   . Sinus bradycardia   . Hyperlipidemia 01/04/2010  . EDEMA 07/04/2008  . CORONARY ARTERY BYPASS GRAFT, HX OF 07/04/2008    Past Surgical History:  Procedure Laterality Date  . CARDIAC CATHETERIZATION  09/26/2012   1/4 patent bypass (occluded SVG-PDA, SVG-OM, LIMA-LAD), SVG-diagonal patent and fills the diagonal and LAD, distal RCA occlusion with left to right collateralization, patent circumflex, LAD with no flow-limiting disease and antegrade flow competitively from SVG-diagonal; EF 60-65%  . COLONOSCOPY  11/30/2009   [id number]. next TCS 11/2019  .  COLONOSCOPY WITH PROPOFOL N/A 12/18/2019   Procedure: COLONOSCOPY WITH PROPOFOL;  Surgeon: Harvel Quale, MD;  Location: [facility] ENDO [facility];  Service: Gastroenterology;  Laterality: N/A;  1030  . CORONARY ARTERY BYPASS GRAFT  2000  . ESOPHAGOGASTRODUODENOSCOPY N/A 08/12/2013   [id number] peptic stricture with erosive refluxesophagitis - status post Maloney dilation. Hiatal hernia. Abnormalgastric mucosa. Deformity of the pyloric channel suggestive ofprior peptic ulcer disease. Duodenal bulbar diverticulum Statuspost gastric biopsy. h.pylori  . LEFT HEART CATHETERIZATION WITH CORONARY/GRAFT ANGIOGRAM N/A  09/26/2012   Procedure: LEFT HEART CATHETERIZATION WITH Beatrix Fetters;  Surgeon: Burnell Blanks, MD;  Location: [facility] CATH LAB;  Service: Cardiovascular;  Laterality: N/A;  . Venia Minks DILATION N/A 08/12/2013   Procedure: Venia Minks DILATION;  Surgeon: Daneil Dolin, MD;  Location: [facility] ENDO SUITE;  Service: Endoscopy;  Laterality: N/A;  . POLYPECTOMY  12/18/2019   Procedure: POLYPECTOMY;  Surgeon: Harvel Quale, MD;  Location: [facility] ENDO SU[facility];  Service: Gastroenterology;;  ascending colon polyp   . PR VEIN BYPASS GRAFT,AORTO-FEM-POP Right 07/19/1999  . PR VEIN BYPASS GRAFT,AORTO-FEM-POP Left 05/02/2006  . SAVORY DILATION N/A 08/12/2013   Procedure: SAVORY DILATION;  Surgeon: Daneil Dolin, MD;  Location: [facility] END[facility];  Service: Endoscopy;  Laterality: N/A;  . WRIST SURGERY     cyst removal       Family History  Problem Relation Age of Onset  . Deep vein thrombosis Father   . Lung cancer Sister   . Diabetes Sister   . Heart disease Sister        After age 77  . Hyperlipidemia Sister   . Hypertension Sister   . Lung cancer Sister   . Breast cancer Sister   . Hypertension Mother   . Diabetes Sister   . Coronary artery disease Other        family hx of  . Cancer Brother        "crab cancer"  . Heart disease Brother   . Heart attack Brother   . Heart attack Daughter   . Colon cancer Neg Hx   . Stroke Neg Hx     Social History   Tobacco Use  . Smoking status: Former Smoker    Quit date: 04/02/1998    Years since quitting: 21.9  . Smokeless tobacco: Never Used  Vaping Use  . Vaping Use: Never used  Substance Use Topics  . Alcohol use: Yes    Alcohol/week: 0.0 - 2.0 standard drinks    Comment: OCCASIONAL  . Drug use: Never    Home Medications Prior to Admission medications   Medication Sig Start Date End Date Taking? Authorizing Provider  amLODipine (NORVASC) 10 MG tablet Take 10 mg by mouth every evening.     [provider]  aspirin EC 81  MG EC tablet Take 1 tablet (81 mg total) by mouth daily. Patient taking differently: Take 81 mg by mouth every evening.  09/27/12   Arguello, Roger A, PA-C  atorvastatin (LIPITOR) 40 MG tablet Take 1 tablet (40 mg total) by mouth daily. Patient taking differently: Take 40 mg by mouth every evening.  11/30/13   Carlena Bjornstad, MD  HYDROcodone-acetaminophen (NORCO/VICODIN) 5-325 MG tablet Take 1 tablet by mouth daily as needed for moderate pain.    [provider]  ipratropium (ATROVENT) 0.03 % nasal spray Place 2 sprays into both nostrils in the morning and at bedtime. 06/01/19   [provider]  Isopropyl Alcohol (ALCOHOL PREP PADS EX)  06/02/14  [provider]  isosorbide mononitrate (IMDUR) 60 MG 24 hr tablet Take 60 mg by mouth every evening.  10/16/19   [provider]  losartan (COZAAR) 100 MG tablet Take 100 mg by mouth daily.      [provider]  metFORMIN (GLUCOPHAGE) 1000 MG tablet Take 1 tablet (1,000 mg total) by mouth 2 (two) times daily with a meal. Patient taking differently: Take 1,000 mg by mouth daily with breakfast.  09/28/12   Arguello, Roger A, PA-C  metoprolol succinate (TOPROL XL) 25 MG 24 hr tablet Take 1 tablet (25 mg total) by mouth daily. 12/14/19   Dunn, Nedra Hai, PA-C  nitroGLYCERIN (NITROSTAT) 0.4 MG SL tablet Place 1 tablet (0.4 mg total) under the tongue every 5 (five) minutes x 3 doses as needed for chest pain. 12/14/19   Dunn, Nedra Hai, PA-C  pantoprazole (PROTONIX) 40 MG tablet TAKE 1 TABLET EVERY DAY Patient taking differently: Take 40 mg by mouth every evening.  12/10/14   Carlis Stable, NP  polyethylene glycol-electrolytes (NULYTELY) 420 g solution Take 4,000 mLs by mouth once.  11/21/19   [provider]  potassium chloride SA (KLOR-CON) 20 MEQ tablet Take on days you need to take demadex Patient taking differently: Take 20 mEq by mouth daily as needed (weight gain over 3 lbs in 24 hours w/torsemide).  07/03/19    Herminio Commons, MD  tamsulosin (FLOMAX) 0.4 MG CAPS capsule Take 0.4 mg by mouth daily as needed (bladder issues.).  03/08/16   [provider]  tetrahydrozoline-zinc (VISINE-AC) 0.05-0.25 % ophthalmic solution 2 drops 3 (three) times daily as needed.    [provider]  torsemide (DEMADEX) 20 MG tablet Take 20 mg for weight gain over 3 lbs in 24 hours Patient taking differently: Take 20 mg by mouth daily as needed (weight gain over 3 lbs in 24 hours).  07/03/19   Herminio Commons, MD  warfarin (COUMADIN) 2 MG tablet Take 2 mg by mouth every evening. MANAGED BY PMD 09/27/12   Danella Sensing A, PA-C    Allergies    Niacin, Iodine-131, and Nsaids  Review of Systems   Review of Systems  Constitutional: Negative for fever.  Respiratory: Positive for chest tightness. Negative for cough and shortness of breath.   Cardiovascular: Positive for chest pain and leg swelling.  Gastrointestinal: Negative for abdominal pain, nausea and vomiting.  Genitourinary: Negative for dysuria.  All other systems reviewed and are negative.   Physical Exam Updated Vital Signs BP (!) 128/53   Pulse 60   Temp 98.3 F (36.8 C) (Oral)   Resp 18   Ht 1.778 m (5\' 10" )   Wt 97.1 kg   SpO2 93%   BMI 30.72 kg/m   Physical Exam Vitals and nursing note reviewed.  Constitutional:      Appearance: He is well-developed and well-nourished. He is not ill-appearing.  HENT:     Head: Normocephalic and atraumatic.  Eyes:     Pupils: Pupils are equal, round, and reactive to light.  Cardiovascular:     Rate and Rhythm: Normal rate. Rhythm irregular.     Heart sounds: Normal heart sounds. No murmur heard.   Pulmonary:     Effort: Pulmonary effort is normal. No respiratory distress.     Breath sounds: Normal breath sounds. No wheezing.  Abdominal:     General: Bowel sounds are normal.     Palpations: Abdomen is soft.     Tenderness: There is no  abdominal tenderness. There is no rebound.   Musculoskeletal:     Cervical back: Neck supple.     Right lower leg: Edema present.     Left lower leg: Edema present.  Lymphadenopathy:     Cervical: No cervical adenopathy.  Skin:    General: Skin is warm and dry.  Neurological:     Mental Status: He is alert and oriented to person, place, and time.  Psychiatric:        Mood and Affect: Mood and affect and mood normal.     ED Results / Procedures / Treatments   Labs (all labs ordered are listed, but only abnormal results are displayed) Labs Reviewed  CBC WITH DIFFERENTIAL/PLATELET - Abnormal; Notable for the following components:      Result Value   WBC 11.7 (*)    RBC 3.52 (*)    Hemoglobin 8.9 (*)    HCT 30.5 (*)    MCH 25.3 (*)    MCHC 29.2 (*)    RDW 18.1 (*)    Neutro Abs 9.4 (*)    All other components within normal limits  BASIC METABOLIC PANEL - Abnormal; Notable for the following components:   CO2 20 (*)    Glucose, Bld 153 (*)    Calcium 8.5 (*)    All other components within normal limits  BRAIN NATRIURETIC PEPTIDE - Abnormal; Notable for the following components:   B Natriuretic Peptide 343.0 (*)    All other components within normal limits  D-DIMER, QUANTITATIVE (NOT AT [facility]) - Abnormal; Notable for the following components:   D-Dimer, Quant 1.01 (*)    All other components within normal limits  RESP PANEL BY RT-PCR (FLU A&B, COVID) [id number]  CULTURE, BLOOD (ROUTINE X 2)  CULTURE, BLOOD (ROUTINE X 2)  TROPONIN I (HIGH SENSITIVITY)  TROPONIN I (HIGH SENSITIVITY)    EKG EKG Interpretation  Date/Time:  Saturday March 26 2020 05:32:15 EST Ventricular Rate:  68 PR Interval:    QRS Duration: 137 QT Interval:  439 QTC Calculation: 467 R Axis:   81 Text Interpretation: Atrial fibrillation Right bundle branch block Confirmed by Thayer Jew [phone]) on 03/26/2020 5:37:22 AM   Radiology CT Angio Chest PE W and/or Wo Contrast  Result Date: 03/26/2020 CLINICAL DATA:  Chest pain. EXAM: CT  ANGIOGRAPHY CHEST WITH CONTRAST TECHNIQUE: Multidetector CT imaging of the chest was performed using the standard protocol during bolus administration of intravenous contrast. Multiplanar CT image reconstructions and MIPs were obtained to evaluate the vascular anatomy. CONTRAST:  124mL OMNIPAQUE IOHEXOL 350 MG/ML SOLN COMPARISON:  None. FINDINGS: Cardiovascular: Heart is enlarged. Status post CABG. Atherosclerotic calcification is noted in the wall of the thoracic aorta. Enlargement of the pulmonary outflow tract and main pulmonary arteries suggests pulmonary arterial hypertension. There is no filling defect within the opacified pulmonary arteries to suggest the presence of an acute pulmonary embolus. 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 Mediastinal lymphadenopathy noted with 1.7 cm short axis right paratracheal node on 40/4. 1.1 cm AP window lymph node identified on 39/4. 1.8 cm short axis subcarinal node on 49/4. 1.4 cm short axis right hilar node is associated with 1.6 cm short axis left hilar node on 53/4. The esophagus has normal imaging features. There is no axillary lymphadenopathy. Lungs/Pleura: Fine architectural detail the lungs is obscured by breathing motion. Centrilobular and paraseptal emphysema evident. Volume loss and airspace consolidation noted right middle lobe and posterior right lower lobe. There  is a small to moderate right pleural effusion associated. Subtle patchy ground-glass attenuation is noted in the left upper lobe. No overtly suspicious nodule or mass. Upper Abdomen: The liver shows diffusely decreased attenuation suggesting fat deposition. Subtle nodular liver contour raises the question of cirrhosis. Subcapsular low-density lesion in the left liver measuring 2.6 cm at the dome cannot be definitively characterized. Musculoskeletal: No worrisome lytic or sclerotic osseous abnormality. Review of the MIP images confirms the above  findings. IMPRESSION: 1. No CT evidence for acute pulmonary embolus. 2. Cardiomegaly. Enlargement of the pulmonary outflow tract and main pulmonary arteries suggests pulmonary arterial hypertension. 3. Volume loss and airspace consolidation in the right middle lobe and posterior right lower lobe. Imaging features in the right middle lobe are highly suggestive of infection. Right lower lobe airspace disease may be related to atelectasis or pneumonia. May be related to atelectasis and/or pneumonia. Associated small to moderate right pleural effusion. 4. Mediastinal and bilateral hilar lymphadenopathy, potentially reactive. Metastatic disease or lymphoma not excluded. Follow-up CT in 3 months recommended to further evaluate. 5. Hepatic steatosis. Subtle nodular liver contour suggests cirrhosis. 2.6 cm low-density lesion in the left liver dome cannot be definitively characterized. When the patient is clinically stable and able to follow directions and hold their breath further evaluation with dedicated abdominal MRI should be considered. 6. Aortic Atherosclerosis (ICD10-I70.0) and Emphysema (ICD10-J43.9). Electronically Signed   By: Misty Stanley M.D.   On: 03/26/2020 05:40   DG Chest Portable 1 View  Result Date: 03/26/2020 CLINICAL DATA:  Chest pain EXAM: PORTABLE CHEST 1 VIEW COMPARISON:  04/24/2018 FINDINGS: 0430 hours. The cardio pericardial silhouette is enlarged. There is pulmonary vascular congestion without overt pulmonary edema. Interstitial markings are diffusely coarsened with chronic features. Subtle patchy airspace disease noted right base. Bones are demineralized. Telemetry leads overlie the chest. IMPRESSION: 1. Enlargement of the cardiopericardial silhouette with pulmonary vascular congestion. 2. Subtle patchy airspace disease at the right base suspicious for pneumonia 3. Right pleural effusion. Electronically Signed   By: Misty Stanley M.D.   On: 03/26/2020 05:41    Procedures Procedures  (including critical care time)  Medications Ordered in ED Medications  cefTRIAXone (ROCEPHIN) 1 g in sodium chloride 0.9 % 100 mL IVPB (has no administration in time range)  azithromycin (ZITHROMAX) 500 mg in sodium chloride 0.9 % 250 mL IVPB (has no administration in time range)  nitroGLYCERIN (NITROGLYN) 2 % ointment 1 inch (1 inch Topical Given 03/26/20 0403)  LORazepam (ATIVAN) tablet 0.5 mg (0.5 mg Oral Given 03/26/20 0403)  iohexol (OMNIPAQUE) 350 MG/ML injection 100 mL (100 mLs Intravenous Contrast Given 03/26/20 0510)  morphine 4 MG/ML injection 4 mg (4 mg Intravenous Given 03/26/20 0550)    ED Course  I have reviewed the triage vital signs and the nursing notes.  Pertinent labs & imaging results that were available during my care of the patient were reviewed by me and considered in my medical decision making (see chart for details).  Clinical Course as of 03/26/20 0602  Sat Mar 26, 2020  0540 Patient still having 3 out of 10 chest pressure.  Will dose morphine. [CH]  0601 Attempted to wean off oxygen.  Dropped O2 sats to 84% of oxygen.  He is not on home oxygen.  Chest CT without obvious pulmonary embolism.  However, highly concerning for infectious process such as pneumonia in the right lung.  Given his hypoxia and white count, this would fit.  This could also cause, pain or pleurisy.  Patient was given Rocephin and azithromycin.  We will plan for admission given oxygen requirement.  Covid testing negative. [CH]    Clinical Course User Index [CH] Demetrious Rainford, Barbette Hair, MD   MDM Rules/Calculators/A&P                          Patient presents with chest pain.  He is overall nontoxic-appearing.  Vital signs notable for hypoxia with O2 sats of 85% without oxygen.  He is on 2 L of oxygen.  Otherwise his vital signs are reassuring and he is afebrile.  History of coronary artery disease.  The pain is right-sided which is different from his normal anginal symptoms.  However, he did have  some improvement with nitroglycerin.  EKG shows no acute ischemic changes.  He does appear to be in atrial fibrillation.  He reports a chronic cough but no fevers.  No Covid contacts or sick exposures.  Chest x-ray shows some possible vascular congestion and a right-sided opacity concerning for pneumonia.  D-dimer was sent and was positive.  Initial troponin negative.  Very atypical for ACS although patient has high risk.  CT chest obtained and does not show any evidence of PE but does confirm pneumonia in the right middle lobe.  Unable to wean off oxygen.  See clinical course above.  Patient was given Rocephin and azithromycin.  We will plan for admission to the hospital.  Currently on 2 L of oxygen.  Final Clinical Impression(s) / ED Diagnoses Final diagnoses:  Community acquired pneumonia of right middle lobe of lung  Atypical chest pain  Hypoxia    Rx / DC Orders ED Discharge Orders    None       Chenay Nesmith, Barbette Hair, MD 03/26/20 [id number]

## 2020-03-26 NOTE — ED Triage Notes (Signed)
[facility] - pt /o chest pain that woke him up from his sleep. Pt 324 ASA and 2 SL nitro. Pt 85% on RA, currently on 4LPM.

## 2020-03-27 ENCOUNTER — Inpatient Hospital Stay (HOSPITAL_COMMUNITY): Payer: Medicare HMO

## 2020-03-27 DIAGNOSIS — A419 Sepsis, unspecified organism: Secondary | ICD-10-CM | POA: Diagnosis not present

## 2020-03-27 DIAGNOSIS — I272 Pulmonary hypertension, unspecified: Secondary | ICD-10-CM

## 2020-03-27 DIAGNOSIS — R6520 Severe sepsis without septic shock: Secondary | ICD-10-CM | POA: Diagnosis not present

## 2020-03-27 DIAGNOSIS — J189 Pneumonia, unspecified organism: Secondary | ICD-10-CM | POA: Diagnosis not present

## 2020-03-27 LAB — CBC WITH DIFFERENTIAL/PLATELET
Abs Immature Granulocytes: 0.03 10*3/uL (ref 0.00–0.07)
Basophils Absolute: 0 10*3/uL (ref 0.0–0.1)
Basophils Relative: 1 %
Eosinophils Absolute: 0.2 10*3/uL (ref 0.0–0.5)
Eosinophils Relative: 3 %
HCT: 31.6 % — ABNORMAL LOW (ref 39.0–52.0)
Hemoglobin: 9.5 g/dL — ABNORMAL LOW (ref 13.0–17.0)
Immature Granulocytes: 0 %
Lymphocytes Relative: 19 %
Lymphs Abs: 1.3 10*3/uL (ref 0.7–4.0)
MCH: 25.8 pg — ABNORMAL LOW (ref 26.0–34.0)
MCHC: 30.1 g/dL (ref 30.0–36.0)
MCV: 85.9 fL (ref 80.0–100.0)
Monocytes Absolute: 0.6 10*3/uL (ref 0.1–1.0)
Monocytes Relative: 9 %
Neutro Abs: 4.9 10*3/uL (ref 1.7–7.7)
Neutrophils Relative %: 68 %
Platelets: 208 10*3/uL (ref 150–400)
RBC: 3.68 MIL/uL — ABNORMAL LOW (ref 4.22–5.81)
RDW: 17.9 % — ABNORMAL HIGH (ref 11.5–15.5)
WBC: 7.2 10*3/uL (ref 4.0–10.5)
nRBC: 0 % (ref 0.0–0.2)

## 2020-03-27 LAB — FOLATE: Folate: 14.6 ng/mL (ref >5.9)

## 2020-03-27 LAB — COMPREHENSIVE METABOLIC PANEL
ALT: 9 U/L (ref 0–44)
AST: 18 U/L (ref 15–41)
Albumin: 3.8 g/dL (ref 3.5–5.0)
Alkaline Phosphatase: 93 U/L (ref 38–126)
Anion gap: 9 (ref 5–15)
BUN: 14 mg/dL (ref 8–23)
CO2: 21 mmol/L — ABNORMAL LOW (ref 22–32)
Calcium: 8.5 mg/dL — ABNORMAL LOW (ref 8.9–10.3)
Chloride: 108 mmol/L (ref 98–111)
Creatinine, Ser: 0.83 mg/dL (ref 0.61–1.24)
GFR, Estimated: >60 mL/min (ref >60)
Glucose, Bld: 124 mg/dL — ABNORMAL HIGH (ref 70–99)
Potassium: 3.8 mmol/L (ref 3.5–5.1)
Sodium: 138 mmol/L (ref 135–145)
Total Bilirubin: 1.1 mg/dL (ref 0.3–1.2)
Total Protein: 7.2 g/dL (ref 6.5–8.1)

## 2020-03-27 LAB — IRON AND TIBC
Iron: 25 ug/dL — ABNORMAL LOW (ref 45–182)
Saturation Ratios: 6 % — ABNORMAL LOW (ref 17.9–39.5)
TIBC: 419 ug/dL (ref 250–450)
UIBC: 394 ug/dL

## 2020-03-27 LAB — FERRITIN: Ferritin: 41 ng/mL (ref 24–336)

## 2020-03-27 LAB — PROTIME-INR
INR: 3.5 — ABNORMAL HIGH (ref 0.8–1.2)
Prothrombin Time: 33.7 seconds — ABNORMAL HIGH (ref 11.4–15.2)

## 2020-03-27 LAB — GLUCOSE, CAPILLARY
Glucose-Capillary: 107 mg/dL — ABNORMAL HIGH (ref 70–99)
Glucose-Capillary: 138 mg/dL — ABNORMAL HIGH (ref 70–99)
Glucose-Capillary: 100 mg/dL — ABNORMAL HIGH (ref 70–99)
Glucose-Capillary: 134 mg/dL — ABNORMAL HIGH (ref 70–99)

## 2020-03-27 LAB — VITAMIN B12: Vitamin B-12: 208 pg/mL (ref 180–914)

## 2020-03-27 NOTE — Progress Notes (Signed)
ANTICOAGULATION CONSULT NOTE -  Pharmacy Consult for warfarin Indication: atrial fibrillation  Allergies  Allergen Reactions  . Niacin Itching and Rash    Burning sensation  . Iodine-131 Nausea And Vomiting  . Nsaids Other (See Comments)    Taking Coumadin    Patient Measurements: Height: 5\' 10"  (177.8 cm) Weight: 97.1 kg (214 lb 1.1 oz) IBW/kg (Calculated) : 73 Heparin Dosing Weight:   Vital Signs: Temp: 98.1 F (36.7 C) (12/26 0417) BP: 118/51 (12/26 0417) Pulse Rate: 58 (12/26 0417)  Labs: Recent Labs    03/26/20 0336 03/26/20 0612 03/27/20 1016  HGB 8.9*  --  9.5*  HCT 30.5*  --  31.6*  PLT 245  --  208  LABPROT 30.5*  --  33.7*  INR 3.0*  --  3.5*  CREATININE 0.98  --  0.83  TROPONINIHS 6 5  --     Estimated Creatinine Clearance: 87.1 mL/min (by C-G formula based on SCr of 0.83 mg/dL).   Medical History: Past Medical History:  Diagnosis Date  . AAA (abdominal aortic aneurysm) ([place])    Followed by Dr. Curt Jews  . Arthritis   . CAD (coronary artery disease)    a. CABG 2000.  [name])    Prostate:  Radiation Tx  . Carotid artery disease ([place])   . Chest pain    precordial. mild chronic .[name]... nonischemic  . Chronic edema   . Coronary artery disease    a. Nuclear, January, 2008, no ischemia b. Cath 08/2012- 1/4 patent grafts, RCA CTO, no flow-limiting disease, medically managed  . Diabetes mellitus without complication ([place])   . Dizziness 02/2011  . Dyslipidemia   . GERD (gastroesophageal reflux disease)    TAKES TUMS & ROLAIDS AS NEEDED  . Hx of CABG 2000  . Hypertension   . Myocardial infarction ([place]) 1999  . Neck pain 02/2011  . Pneumonia   . Pulmonary hypertension ([place])   . Renal artery stenosis ([facility])    50-70%  . S/P femoropopliteal bypass surgery    Dr. Donnetta Hutching  . Sinus bradycardia    Asymptomatic    Medications:  Medications Prior to Admission  Medication Sig Dispense Refill Last Dose  . amLODipine (NORVASC) 10 MG tablet  Take 10 mg by mouth every evening.   03/25/2020 at Unknown time  . aspirin EC 81 MG EC tablet Take 1 tablet (81 mg total) by mouth daily. (Patient taking differently: Take 81 mg by mouth every evening.) 30 tablet 3 03/25/2020 at 2200  . atorvastatin (LIPITOR) 40 MG tablet Take 1 tablet (40 mg total) by mouth daily. (Patient taking differently: Take 40 mg by mouth every evening.) 90 tablet 1 03/25/2020 at Unknown time  . HYDROcodone-acetaminophen (NORCO/VICODIN) 5-325 MG tablet Take 1 tablet by mouth daily as needed for moderate pain.   03/25/2020 at Unknown time  . ipratropium (ATROVENT) 0.03 % nasal spray Place 2 sprays into both nostrils 2 (two) times daily.   03/25/2020 at Unknown time  . isosorbide mononitrate (IMDUR) 60 MG 24 hr tablet Take 60 mg by mouth every evening.    03/25/2020 at Unknown time  . losartan (COZAAR) 100 MG tablet Take 100 mg by mouth daily.   03/25/2020 at Unknown time  . metFORMIN (GLUCOPHAGE) 1000 MG tablet Take 1 tablet (1,000 mg total) by mouth 2 (two) times daily with a meal. (Patient taking differently: Take 1,000 mg by mouth 2 (two) times daily with a meal. Pt states he will take twice daily  if he remembers. Most of the time he takes 1 tab pm.)   03/25/2020 at Unknown time  . metoprolol succinate (TOPROL XL) 25 MG 24 hr tablet Take 1 tablet (25 mg total) by mouth daily. 90 tablet 3 03/25/2020 at Unknown time  . nitroGLYCERIN (NITROSTAT) 0.4 MG SL tablet Place 1 tablet (0.4 mg total) under the tongue every 5 (five) minutes x 3 doses as needed for chest pain. 25 tablet 3 03/26/2020 at Unknown time  . pantoprazole (PROTONIX) 40 MG tablet TAKE 1 TABLET EVERY DAY (Patient taking differently: Take 40 mg by mouth every evening.) 90 tablet 3 03/25/2020 at Unknown time  . potassium chloride SA (KLOR-CON) 20 MEQ tablet Take on days you need to take demadex (Patient taking differently: Take 20 mEq by mouth daily as needed (weight gain over 3 lbs in 24 hours w/torsemide).) 60 tablet 0    . torsemide (DEMADEX) 20 MG tablet Take 20 mg for weight gain over 3 lbs in 24 hours (Patient taking differently: Take 20 mg by mouth daily as needed (weight gain over 3 lbs in 24 hours).) 60 tablet 0   . warfarin (COUMADIN) 2 MG tablet Take 2 mg by mouth every evening. MANAGED BY PMD   03/25/2020 at Unknown time  . polyethylene glycol-electrolytes (NULYTELY) 420 g solution Take 4,000 mLs by mouth once.  (Patient not taking: Reported on 03/26/2020)   Completed Course at Unknown time  . tamsulosin (FLOMAX) 0.4 MG CAPS capsule Take 0.4 mg by mouth daily as needed (bladder issues.).  (Patient not taking: Reported on 03/26/2020)   Not Taking at Unknown time  . tetrahydrozoline-zinc (VISINE-AC) 0.05-0.25 % ophthalmic solution 2 drops 3 (three) times daily as needed. (Patient not taking: Reported on 03/26/2020)   Not Taking at Unknown time    Assessment: Pharmacy consulted to dose warfarin in patient with atrial fibrillation.  Patient's INR on admission is 3.0 with last dose given 12/24 .  Home dose listed as 2 mg daily.  INR supratherapeutic at 3.5  Goal of Therapy:  INR 2-3 Monitor platelets by anticoagulation protocol: Yes   Plan:  Hold warfarin x 1 dose Monitor daily INR and s/s of bleeding.  Margot Ables, PharmD Clinical Pharmacist 03/27/2020 11:24 AM

## 2020-03-27 NOTE — Progress Notes (Addendum)
Patient Demographics:    Troy [name], is a 79 y.o. male, DOB - [birth year], SHU:[id number]  Admit date - 03/26/2020   Admitting Physician Lella Mullany Denton Brick, MD  Outpatient Primary MD for the patient is Sharilyn Sites, MD  LOS - 1   Chief Complaint  Patient presents with  . Chest Pain        Subjective:    Troy [name] today has no fevers, no emesis,  No chest pain,   Cough and shob persist -Hypoxia persist, requiring 3 L of oxygen via nasal cannula  -This am Labs are Still pending  Assessment  & Plan :    Principal Problem:   Sepsis due to Rt Sided Pneumonia Active Problems:   Rt Sided Community acquired pneumonia   --Severe pulmonary HTN, 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), DM2,  prostate CA, andesophageal stricture requiring dilatation , as well as 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, dysphagia with oropharyngeal and UES dysfunction with prior dilatation of his UES presents with cough and right-sided chest discomfort and found to have right-sided pneumonia  A/p 1)Rt Sided PNA-- pt with H/o 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, dysphagia with oropharyngeal and UES dysfunction ---He has h/o recurrent aspiration PNA =--- Cough and shob persist -Hypoxia persist, requiring 3 L of oxygen  via nasal cannula -c/n Riocephin/Azith, will consider dc home on Augmentin once he improves from resp standpoint =-Continue bronchodilators and mucolytics -Leukocytosis noted  2)Severe Pulmonary HTN (PASP is 75 mmhg)--- CTA chest w/o pulm embolism -Low clinical index of suspicion for recurrent Pulm embolism (on coumadin)  , will PFTs and outpt advance Heart Failure Cardiology follow-up with Dr Marigene Ehlers on 04/04/2020 --BNP is elevated at 343, no baseline available - 3)H/o CAD--s/p prior CABG in 2000, low clinical index of suspicion for ACS Last LHC in 2017 with single-vessel disease at that time, medical management was advised at that time -Continue aspirin and Lipitor, as well as isosorbide and metoprolol -Troponin x2 is 6 and 5 respectively -Remains chest pain-free  4)Dysphonia and dysphagia--- patient sees E[facility]  his dysphonia is due to right vocal fold immobility -   His Dysphagia is due to- oropharyngeal and UES dysfunction-status post prior UES dilatation -patient with history of recurrent aspiration. -Possible aspiration pneumonia as above #1  5)Permanent atrial fibrillation--continue Coumadin for anticoagulation, metoprolol for rate control  6)Essential HTN--stable, continue amlodipine 10 mg daily, metoprolol and isosorbide as ordered  7)DM2 -A1c 6.2,, reflecting excellent diabetic control PTA -- use Novolog/Humalog Sliding scale insulin with Accu-Cheks/Fingersticks as ordered   8)PAD/PVD--  with prior fem-pop bypass grafting, infrarenal abdominal aortic aneurysm (4.7cm 10/2019), prior renal artery stenosis, carotid artery stenosis----continue aspirin and Coumadin therapy and  atorvastatin  9) Mediastinal and bilateral hilar lymphadenopathy, potentially reactive. Metastatic disease or lymphoma not excluded--- Follow-up CT in 3 months recommended to further evaluate.  10)Hepatic Steatosis--- --CT suggest  Subtle nodular liver contour suggests cirrhosis. 2.6 cm  low-density lesion in the left liver dome cannot be definitively characterized.  -Liver ultrasound consistent with mild hepatic steatosis, --- may need abdominal MRI as outpatient.  11)Acute on chronic anemia hemoglobin 8.9 which is lower than previous baseline -Check stool occult blood, serum iron, ferritin, TIBC folate and B12  12) acute hypoxic respiratory failure--- secondary to #1 above, #2 is probably contributory, patient may need home O2 at discharge -Currently requiring 4 L of nasal cannula  13) sepsis secondary to right-sided pneumonia--patient met sepsis criteria on admission with leukocytosis with WBC of 11.7, tachypnea with respiratory rate of 23, as well as hypoxia requiring up to 3 L of oxygen via nasal cannula -Treat as above #1 pending blood cultures -No tachycardia most likely because patient is on metoprolol  Disposition/Need for in-Hospital Stay- patient unable to be discharged at this time due to --sepsis secondary to right-sided pneumonia with acute hypoxic respiratory failure requiring IV antibiotics,  Status is: Inpatient  Remains inpatient appropriate because: See above    Dispo: The patient is from: Home  Anticipated d/c is to: Home  Anticipated d/c date is: 2 days Barriers: Not Clinically Stable-   Code Status :  -  Code Status: Full Code   Family Communication:  (patient is alert, awake and coherent)   Consults  :  na  DVT Prophylaxis  :     SCDs Start: 03/26/20 0824 Place TED hose Start: 03/26/20 0824    Lab Results  Component Value Date   PLT 245 03/26/2020    Inpatient Medications  Scheduled Meds: . amLODipine  10 mg Oral QPM  . aspirin EC  81 mg Oral Daily  . atorvastatin  40 mg Oral QPM  . guaiFENesin  600 mg Oral BID  . insulin aspart  0-15 Units Subcutaneous TID WC  . insulin aspart  0-5 Units Subcutaneous QHS  . ipratropium-albuterol  3 mL Nebulization QID  . isosorbide mononitrate  60 mg Oral QPM   . metoprolol succinate  25 mg Oral Daily  . pantoprazole  40 mg Oral Daily  . sodium chloride flush  3 mL Intravenous Q12H  . sodium chloride flush  3 mL Intravenous Q12H   Continuous Infusions: . sodium chloride    . azithromycin 500 mg (03/27/20 0506)  . cefTRIAXone (ROCEPHIN)  IV 2 g (03/27/20 0609)   PRN Meds:.sodium chloride, acetaminophen **OR** acetaminophen, albuterol, bisacodyl, HYDROcodone-acetaminophen, nitroGLYCERIN, ondansetron **OR** ondansetron (ZOFRAN) IV, polyethylene glycol, prochlorperazine, sodium chloride flush, tamsulosin, traZODone    Anti-infectives (From admission, onward)   Start     Dose/Rate Route Frequency Ordered Stop   03/27/20 0600  azithromycin (ZITHROMAX) 500 mg in sodium chloride 0.9 % 250 mL IVPB        500 mg 250 mL/hr over 60 Minutes Intravenous Every 24 hours 03/26/20 0618 04/01/20 0559   03/26/20 0630  cefTRIAXone (ROCEPHIN) 2 g in sodium chloride 0.9 % 100 mL IVPB        2 g 200 mL/hr over 30 Minutes Intravenous Every 24 hours 03/26/20 0618 03/31/20 0629   03/26/20 0600  cefTRIAXone (ROCEPHIN) 1 g in sodium chloride 0.9 % 100 mL IVPB  Status:  Discontinued        1 g 200 mL/hr over 30 Minutes Intravenous  Once  03/26/20 0559 03/26/20 0618   03/26/20 0600  azithromycin (ZITHROMAX) 500 mg in sodium chloride 0.9 % 250 mL IVPB  Status:  Discontinued        500 mg 250 mL/hr over 60 Minutes Intravenous Every 24 hours 03/26/20 0559 03/26/20 0618        Objective:   Vitals:   03/26/20 1932 03/26/20 2100 03/27/20 0417 03/27/20 0624  BP:  (!) 111/55 (!) 118/51   Pulse:  (!) 47 (!) 58   Resp:  17 20   Temp:  98.1 F (36.7 C) 98.1 F (36.7 C)   TempSrc:      SpO2: (!) 89% 94% 94% 94%  Weight:      Height:        Wt Readings from Last 3 Encounters:  03/26/20 97.1 kg  12/16/19 97.1 kg  12/14/19 97.3 kg    No intake or output data in the 24 hours ending 03/27/20 1031   Physical Exam  Gen:- Awake Alert,  In no apparent distress   HEENT:- [place].AT, No sclera icterus Nose- [place] 3L/min Neck-Supple Neck,No JVD,.  Lungs-decreased breath sounds, scattered rhonchi on the right  CV- S1, S2 normal,irregular  Abd-  +ve B.Sounds, Abd Soft, No tenderness,    Extremity/Skin:- bilateral lower extremity edema left more than right, intact peripheral pulses  Psych-affect is appropriate, oriented x3 Neuro-generalized weakness , no new focal deficits, no tremors   Data Review:   Micro Results Recent Results (from the past 240 hour(s))  Resp Panel by RT-PCR (Flu A&B, Covid) Nasopharyngeal Swab     Status: None   Collection Time: 03/26/20  3:56 AM   Specimen: Nasopharyngeal Swab; Nasopharyngeal(NP) swabs in vial transport medium  Result Value Ref Range Status   SARS Coronavirus 2 by RT PCR NEGATIVE NEGATIVE Final    Comment: (NOTE) SARS-CoV-2 target nucleic acids are NOT DETECTED.  The SARS-CoV-2 RNA is generally detectable in upper respiratory specimens during the acute phase of infection. The lowest concentration of SARS-CoV-2 viral copies this assay can detect is 138 copies/mL. A negative result does not preclude SARS-Cov-2 infection and should not be used as the sole basis for treatment or other patient management decisions. A negative result may occur with  improper specimen collection/handling, submission of specimen other than nasopharyngeal swab, presence of viral mutation(s) within the areas targeted by this assay, and inadequate number of viral copies(<138 copies/mL). A negative result must be combined with clinical observations, patient history, and epidemiological information. The expected result is Negative.  Fact Sheet for Patients:  EntrepreneurPulse.com.au  Fact Sheet for Healthcare Providers:  IncredibleEmployment.be  This test is no t yet approved or cleared by the [place] FDA and  has been authorized for detection and/or diagnosis of SARS-CoV-2 by FDA under an  Emergency Use Authorization (EUA). This EUA will remain  in effect (meaning this test can be used) for the duration of the COVID-19 declaration under Section 564(b)(1) of the Act, 21 U.S.C.section 360bbb-3(b)(1), unless the authorization is terminated  or revoked sooner.       Influenza A by PCR NEGATIVE NEGATIVE Final   Influenza B by PCR NEGATIVE NEGATIVE Final    Comment: (NOTE) The Xpert Xpress SARS-CoV-2/FLU/RSV plus assay is intended as an aid in the diagnosis of influenza from Nasopharyngeal swab specimens and should not be used as a sole basis for treatment. Nasal washings and aspirates are unacceptable for Xpert Xpress SARS-CoV-2/FLU/RSV testing.  Fact Sheet for Patients: EntrepreneurPulse.com.au  Fact Sheet for Healthcare Providers: IncredibleEmployment.be  This test is not yet approved or cleared by the [place] and has been authorized for detection and/or diagnosis of SARS-CoV-2 by FDA under an Emergency Use Authorization (EUA). This EUA will remain in effect (meaning this test can be used) for the duration of the COVID-19 declaration under Section 564(b)(1) of the Act, 21 U.S.C. section 360bbb-3(b)(1), unless the authorization is terminated or revoked.  Performed at [facility], [address] [place], [place] [address]   Blood culture (routine x 2)     Status: None (Preliminary result)   Collection Time: 03/26/20  6:17 AM   Specimen: Right Antecubital; Blood  Result Value Ref Range Status   Specimen Description RIGHT ANTECUBITAL  Final   Special Requests   Final    BOTTLES DRAWN AEROBIC AND ANAEROBIC Blood Culture adequate volume Performed at [facility], [address] [place], [place] [address]    Culture PENDING  Incomplete   Report Status PENDING  Incomplete  Blood culture (routine x 2)     Status: None (Preliminary result)   Collection Time: 03/26/20  6:18 AM   Specimen: Left Antecubital; Blood  Result  Value Ref Range Status   Specimen Description LEFT ANTECUBITAL  Final   Special Requests   Final    BOTTLES DRAWN AEROBIC AND ANAEROBIC Blood Culture adequate volume Performed at [facility], [address], [place], [place] [address]    Culture PENDING  Incomplete   Report Status PENDING  Incomplete    Radiology Reports CT Angio Chest PE W and/or Wo Contrast  Result Date: 03/26/2020 CLINICAL DATA:  Chest pain. EXAM: CT ANGIOGRAPHY CHEST WITH CONTRAST TECHNIQUE: Multidetector CT imaging of the chest was performed using the standard protocol during bolus administration of intravenous contrast. Multiplanar CT image reconstructions and MIPs were obtained to evaluate the vascular anatomy. CONTRAST:  1104m OMNIPAQUE IOHEXOL 350 MG/ML SOLN COMPARISON:  None. FINDINGS: Cardiovascular: Heart is enlarged. Status post CABG. Atherosclerotic calcification is noted in the wall of the thoracic aorta. Enlargement of the pulmonary outflow tract and main pulmonary arteries suggests pulmonary arterial hypertension. There is no filling defect within the opacified pulmonary arteries to suggest the presence of an acute pulmonary embolus. 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 Mediastinal lymphadenopathy noted with 1.7 cm short axis right paratracheal node on 40/4. 1.1 cm AP window lymph node identified on 39/4. 1.8 cm short axis subcarinal node on 49/4. 1.4 cm short axis right hilar node is associated with 1.6 cm short axis left hilar node on 53/4. The esophagus has normal imaging features. There is no axillary lymphadenopathy. Lungs/Pleura: Fine architectural detail the lungs is obscured by breathing motion. Centrilobular and paraseptal emphysema evident. Volume loss and airspace consolidation noted right middle lobe and posterior right lower lobe. There is a small to moderate right pleural effusion associated. Subtle patchy ground-glass attenuation is noted in the  left upper lobe. No overtly suspicious nodule or mass. Upper Abdomen: The liver shows diffusely decreased attenuation suggesting fat deposition. Subtle nodular liver contour raises the question of cirrhosis. Subcapsular low-density lesion in the left liver measuring 2.6 cm at the dome cannot be definitively characterized. Musculoskeletal: No worrisome lytic or sclerotic osseous abnormality. Review of the MIP images confirms the above findings. IMPRESSION: 1. No CT evidence for acute pulmonary embolus. 2. Cardiomegaly. Enlargement of the pulmonary outflow tract and main pulmonary arteries suggests pulmonary arterial hypertension. 3. Volume loss and airspace consolidation in the right middle lobe and posterior  right lower lobe. Imaging features in the right middle lobe are highly suggestive of infection. Right lower lobe airspace disease may be related to atelectasis or pneumonia. May be related to atelectasis and/or pneumonia. Associated small to moderate right pleural effusion. 4. Mediastinal and bilateral hilar lymphadenopathy, potentially reactive. Metastatic disease or lymphoma not excluded. Follow-up CT in 3 months recommended to further evaluate. 5. Hepatic steatosis. Subtle nodular liver contour suggests cirrhosis. 2.6 cm low-density lesion in the left liver dome cannot be definitively characterized. When the patient is clinically stable and able to follow directions and hold their breath further evaluation with dedicated abdominal MRI should be considered. 6. Aortic Atherosclerosis (ICD10-I70.0) and Emphysema (ICD10-J43.9). Electronically Signed   By: Misty Stanley M.D.   On: 03/26/2020 05:40   DG Chest Portable 1 View  Result Date: 03/26/2020 CLINICAL DATA:  Chest pain EXAM: PORTABLE CHEST 1 VIEW COMPARISON:  04/24/2018 FINDINGS: 0430 hours. The cardio pericardial silhouette is enlarged. There is pulmonary vascular congestion without overt pulmonary edema. Interstitial markings are diffusely coarsened  with chronic features. Subtle patchy airspace disease noted right base. Bones are demineralized. Telemetry leads overlie the chest. IMPRESSION: 1. Enlargement of the cardiopericardial silhouette with pulmonary vascular congestion. 2. Subtle patchy airspace disease at the right base suspicious for pneumonia 3. Right pleural effusion. Electronically Signed   By: Misty Stanley M.D.   On: 03/26/2020 05:41   US Abdomen Limited RUQ (LIVER/GB)  Result Date: 03/27/2020 CLINICAL DATA:  Cirrhosis. EXAM: ULTRASOUND ABDOMEN LIMITED RIGHT UPPER QUADRANT COMPARISON:  CTA chest 1 day prior. FINDINGS: Gallbladder: No stones. Mild gallbladder wall thickening at 4 mm. Trace pericholecystic fluid. Sonographic Murphy's sign was not elicited. Common bile duct: Diameter: Normal, 8 mm. Liver: Mildly increased hepatic echogenicity. The left hepatic lobe lesion on CT is not visualized. Portal vein is patent on color Doppler imaging with normal direction of blood flow towards the liver. Other: Right-sided pleural effusion incidentally noted. IMPRESSION: No gallstones. Nonspecific mild gallbladder wall thickening and pericholecystic fluid. Mild hepatic steatosis. Right pleural effusion. Electronically Signed   By: Abigail Miyamoto M.D.   On: 03/27/2020 10:06     CBC Recent Labs  Lab 03/26/20 0336  WBC 11.7*  HGB 8.9*  HCT 30.5*  PLT 245  MCV 86.6  MCH 25.3*  MCHC 29.2*  RDW 18.1*  LYMPHSABS 1.1  MONOABS 0.9  EOSABS 0.2  BASOSABS 0.1    Chemistries  Recent Labs  Lab 03/26/20 0336  NA 137  K 3.8  CL 107  CO2 20*  GLUCOSE 153*  BUN 17  CREATININE 0.98  CALCIUM 8.5*   ------------------------------------------------------------------------------------------------------------------ No results for input(s): CHOL, HDL, LDLCALC, TRIG, CHOLHDL, LDLDIRECT in the last 72 hours.  Lab Results  Component Value Date   HGBA1C 6.2 (H) 03/26/2020    ------------------------------------------------------------------------------------------------------------------ No results for input(s): TSH, T4TOTAL, T3FREE, THYROIDAB in the last 72 hours.  Invalid input(s): FREET3 ------------------------------------------------------------------------------------------------------------------ No results for input(s): VITAMINB12, FOLATE, FERRITIN, TIBC, IRON, RETICCTPCT in the last 72 hours.  Coagulation profile Recent Labs  Lab 03/26/20 0336  INR 3.0*    Recent Labs    03/26/20 0336  DDIMER 1.01*    Cardiac Enzymes No results for input(s): CKMB, TROPONINI, MYOGLOBIN in the last 168 hours.  Invalid input(s): CK ------------------------------------------------------------------------------------------------------------------    Component Value Date/Time   BNP 343.0 (H) 03/26/2020 2263     Roxan Hockey M.D on 03/27/2020 at 10:31 AM  Go to [email or web address] - for contact info  [facility] - Office  [phone]

## 2020-03-28 DIAGNOSIS — I25810 Atherosclerosis of coronary artery bypass graft(s) without angina pectoris: Secondary | ICD-10-CM

## 2020-03-28 DIAGNOSIS — J189 Pneumonia, unspecified organism: Secondary | ICD-10-CM | POA: Diagnosis not present

## 2020-03-28 DIAGNOSIS — A419 Sepsis, unspecified organism: Secondary | ICD-10-CM | POA: Diagnosis not present

## 2020-03-28 DIAGNOSIS — I4821 Permanent atrial fibrillation: Secondary | ICD-10-CM | POA: Diagnosis not present

## 2020-03-28 LAB — PROTIME-INR
INR: 3.4 — ABNORMAL HIGH (ref 0.8–1.2)
Prothrombin Time: 33.1 seconds — ABNORMAL HIGH (ref 11.4–15.2)

## 2020-03-28 LAB — GLUCOSE, CAPILLARY
Glucose-Capillary: 123 mg/dL — ABNORMAL HIGH (ref 70–99)
Glucose-Capillary: 103 mg/dL — ABNORMAL HIGH (ref 70–99)
Glucose-Capillary: 117 mg/dL — ABNORMAL HIGH (ref 70–99)
Glucose-Capillary: 114 mg/dL — ABNORMAL HIGH (ref 70–99)

## 2020-03-28 MED ORDER — CYANOCOBALAMIN 1000 MCG/ML IJ SOLN
1000.0000 ug | Freq: Once | INTRAMUSCULAR | Status: AC
  Administered 2020-03-28: 19:00:00 1000 ug via INTRAMUSCULAR
  Filled 2020-03-28: qty 1

## 2020-03-28 NOTE — Progress Notes (Signed)
ANTICOAGULATION CONSULT NOTE -  Pharmacy Consult for warfarin Indication: atrial fibrillation  Allergies  Allergen Reactions  . Niacin Itching and Rash    Burning sensation  . Iodine-131 Nausea And Vomiting  . Nsaids Other (See Comments)    Taking Coumadin    Patient Measurements: Height: 5\' 10"  (177.8 cm) Weight: 97.1 kg (214 lb 1.1 oz) IBW/kg (Calculated) : 73 Heparin Dosing Weight:   Vital Signs: Temp: 97.8 F (36.6 C) (12/27 0550) Temp Source: Oral (12/27 0550) BP: 129/61 (12/27 0550) Pulse Rate: 57 (12/27 0550)  Labs: Recent Labs    03/26/20 0336 03/26/20 0612 03/27/20 1016 03/28/20 0644  HGB 8.9*  --  9.5*  --   HCT 30.5*  --  31.6*  --   PLT 245  --  208  --   LABPROT 30.5*  --  33.7* 33.1*  INR 3.0*  --  3.5* 3.4*  CREATININE 0.98  --  0.83  --   TROPONINIHS 6 5  --   --     Estimated Creatinine Clearance: 87.1 mL/min (by C-G formula based on SCr of 0.83 mg/dL).   Medical History: Past Medical History:  Diagnosis Date  . AAA (abdominal aortic aneurysm) (HCC)    Followed by Dr. 03/30/20  . Arthritis   . CAD (coronary artery disease)    a. CABG 2000.  2001 Cancer [facility])    Prostate:  Radiation Tx  . Carotid artery disease (HCC)   . Chest pain    precordial. mild chronic .[facility]... nonischemic  . Chronic edema   . Coronary artery disease    a. Nuclear, January, 2008, no ischemia b. Cath 08/2012- 1/4 patent grafts, RCA CTO, no flow-limiting disease, medically managed  . Diabetes mellitus without complication (HCC)   . Dizziness 02/2011  . Dyslipidemia   . GERD (gastroesophageal reflux disease)    TAKES TUMS & ROLAIDS AS NEEDED  . Hx of CABG 2000  . Hypertension   . Myocardial infarction (HCC) 1999  . Neck pain 02/2011  . Pneumonia   . Pulmonary hypertension (HCC)   . Renal artery stenosis (HCC)    50-70%  . S/P femoropopliteal bypass surgery    Dr. 03/2011  . Sinus bradycardia    Asymptomatic    Medications:  Medications Prior to Admission   Medication Sig Dispense Refill Last Dose  . amLODipine (NORVASC) 10 MG tablet Take 10 mg by mouth every evening.   03/25/2020 at Unknown time  . aspirin EC 81 MG EC tablet Take 1 tablet (81 mg total) by mouth daily. (Patient taking differently: Take 81 mg by mouth every evening.) 30 tablet 3 03/25/2020 at 2200  . atorvastatin (LIPITOR) 40 MG tablet Take 1 tablet (40 mg total) by mouth daily. (Patient taking differently: Take 40 mg by mouth every evening.) 90 tablet 1 03/25/2020 at Unknown time  . HYDROcodone-acetaminophen (NORCO/VICODIN) 5-325 MG tablet Take 1 tablet by mouth daily as needed for moderate pain.   03/25/2020 at Unknown time  . ipratropium (ATROVENT) 0.03 % nasal spray Place 2 sprays into both nostrils 2 (two) times daily.   03/25/2020 at Unknown time  . isosorbide mononitrate (IMDUR) 60 MG 24 hr tablet Take 60 mg by mouth every evening.    03/25/2020 at Unknown time  . losartan (COZAAR) 100 MG tablet Take 100 mg by mouth daily.   03/25/2020 at Unknown time  . metFORMIN (GLUCOPHAGE) 1000 MG tablet Take 1 tablet (1,000 mg total) by mouth 2 (two) times daily with  a meal. (Patient taking differently: Take 1,000 mg by mouth 2 (two) times daily with a meal. Pt states he will take twice daily if he remembers. Most of the time he takes 1 tab pm.)   03/25/2020 at Unknown time  . metoprolol succinate (TOPROL XL) 25 MG 24 hr tablet Take 1 tablet (25 mg total) by mouth daily. 90 tablet 3 03/25/2020 at Unknown time  . nitroGLYCERIN (NITROSTAT) 0.4 MG SL tablet Place 1 tablet (0.4 mg total) under the tongue every 5 (five) minutes x 3 doses as needed for chest pain. 25 tablet 3 03/26/2020 at Unknown time  . pantoprazole (PROTONIX) 40 MG tablet TAKE 1 TABLET EVERY DAY (Patient taking differently: Take 40 mg by mouth every evening.) 90 tablet 3 03/25/2020 at Unknown time  . potassium chloride SA (KLOR-CON) 20 MEQ tablet Take on days you need to take demadex (Patient taking differently: Take 20 mEq by  mouth daily as needed (weight gain over 3 lbs in 24 hours w/torsemide).) 60 tablet 0   . torsemide (DEMADEX) 20 MG tablet Take 20 mg for weight gain over 3 lbs in 24 hours (Patient taking differently: Take 20 mg by mouth daily as needed (weight gain over 3 lbs in 24 hours).) 60 tablet 0   . warfarin (COUMADIN) 2 MG tablet Take 2 mg by mouth every evening. MANAGED BY PMD   03/25/2020 at Unknown time  . polyethylene glycol-electrolytes (NULYTELY) 420 g solution Take 4,000 mLs by mouth once.  (Patient not taking: Reported on 03/26/2020)   Completed Course at Unknown time  . tamsulosin (FLOMAX) 0.4 MG CAPS capsule Take 0.4 mg by mouth daily as needed (bladder issues.).  (Patient not taking: Reported on 03/26/2020)   Not Taking at Unknown time  . tetrahydrozoline-zinc (VISINE-AC) 0.05-0.25 % ophthalmic solution 2 drops 3 (three) times daily as needed. (Patient not taking: Reported on 03/26/2020)   Not Taking at Unknown time    Assessment: Pharmacy consulted to dose warfarin in patient with atrial fibrillation.  Patient's INR on admission is 3.0 with last dose given 12/24 .  Home dose listed as 2 mg daily.  INR supratherapeutic at 3.4  Goal of Therapy:  INR 2-3 Monitor platelets by anticoagulation protocol: Yes   Plan:  Hold warfarin x 1 dose Monitor daily INR and s/s of bleeding.  Margot Ables, PharmD Clinical Pharmacist 03/28/2020 8:11 AM

## 2020-03-28 NOTE — Progress Notes (Signed)
SATURATION QUALIFICATIONS: (This note is used to comply with regulatory documentation for home oxygen)  Patient Saturations on Room Air at Rest = 92%  Patient Saturations on Room Air while Ambulating = 85%  Patient Saturations on 2 Liters of oxygen while Ambulating = 98%  Please briefly explain why patient needs home oxygen:  While ambulating on room air oxygen level dropped to 85% and pt states having short of breath.

## 2020-03-28 NOTE — Progress Notes (Signed)
Patient Demographics:    Troy [name], is a 79 y.o. male, DOB - [birth year], SHN:[id number]  Admit date - 03/26/2020   Admitting Physician Candon Caras Denton Brick, MD  Outpatient Primary MD for the patient is Sharilyn Sites, MD  LOS - 2   Chief Complaint  Patient presents with  . Chest Pain        Subjective:    Troy [name] today has no fevers, no emesis,  No chest pain,   - -Hypoxia persist, requiring 2 L of oxygen via nasal cannula with activity -- Cough and DOE-- persist , requiring 2L/min   Assessment  & Plan :    Principal Problem:   Sepsis due to Rt Sided Pneumonia Active Problems:   Rt Sided Community acquired pneumonia   --Severe pulmonary HTN, 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), DM2,  prostate CA, andesophageal stricture requiring dilatation , as well as 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, dysphagia with oropharyngeal and UES dysfunction with prior dilatation of his UES presents with cough and right-sided chest discomfort and found to have right-sided pneumonia  A/p 1)Rt Sided PNA-- pt with H/o 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, dysphagia with oropharyngeal and UES dysfunction ---He has h/o recurrent aspiration PNA =--- Cough and shob persist -Hypoxia persist, requiring 2 L  of oxygen via nasal cannula -c/n Riocephin/Azith, will consider dc home on Augmentin once he improves from resp standpoint =-Continue bronchodilators and mucolytics -Leukocytosis noted  2)Severe Pulmonary HTN (PASP is 75 mmhg)--- CTA chest w/o pulm embolism -Low clinical index of suspicion for recurrent Pulm embolism (on coumadin)  , will PFTs and outpt advance Heart Failure Cardiology follow-up with Dr Marigene Ehlers on 04/04/2020 --BNP is elevated at 343, no baseline available - 3)H/o CAD--s/p prior CABG in 2000, low clinical index of suspicion for ACS Last LHC in 2017 with single-vessel disease at that time, medical management was advised at that time -Continue aspirin and Lipitor, as well as isosorbide and metoprolol -Troponin x2 is 6 and 5 respectively -Remains chest pain-free  4)Dysphonia and Dysphagia--- patient sees ENT at [facility]  his dysphonia is due to right vocal fold immobility -   His Dysphagia is due to- oropharyngeal and UES dysfunction-status post prior UES dilatation -patient with history of recurrent aspiration. -Possible aspiration pneumonia as above #1  5)Permanent atrial fibrillation--continue Coumadin for anticoagulation, metoprolol for rate control  6)Essential HTN--stable, continue amlodipine 10 mg daily, metoprolol and isosorbide as ordered  7)DM2 -A1c 6.2,, reflecting excellent diabetic control PTA -- use Novolog/Humalog Sliding scale insulin with Accu-Cheks/Fingersticks as ordered   8)PAD/PVD--  with prior fem-pop bypass grafting, infrarenal abdominal aortic aneurysm (4.7cm 10/2019), prior renal artery stenosis, carotid artery stenosis----continue aspirin and Coumadin therapy  and atorvastatin  9) Mediastinal and bilateral hilar lymphadenopathy, potentially reactive. Metastatic disease or lymphoma not excluded--- Follow-up CT in 3 months recommended to further evaluate.  10)Hepatic Steatosis--- --CT suggest  Subtle nodular liver contour suggests cirrhosis.  2.6 cm low-density lesion in the left liver dome cannot be definitively characterized.  -Liver ultrasound consistent with mild hepatic steatosis, --- may need abdominal MRI as outpatient.  11)Acute on chronic anemia hemoglobin 8.9 which is lower than previous baseline -Check stool occult blood, serum iron, ferritin, TIBC folate and B12  12) acute hypoxic respiratory failure--- secondary to #1 above, #2 is probably contributory, patient may need home O2 at discharge -Currently requiring 2 L of nasal cannula  13)Sepsis secondary to right-sided pneumonia--patient met sepsis criteria on admission with leukocytosis with WBC of 11.7, tachypnea with respiratory rate of 23, as well as hypoxia requiring up to 3 L of oxygen via nasal cannula -Treat as above #1 pending blood cultures -No tachycardia most likely because patient is on metoprolol  Disposition/Need for in-Hospital Stay- patient unable to be discharged at this time due to --sepsis secondary to right-sided pneumonia with acute hypoxic respiratory failure requiring IV antibiotics,  Status is: Inpatient  Remains inpatient appropriate because: See above   Dispo: The patient is from: Home  Anticipated d/c is to: Home  Anticipated d/c date is: 2 days Barriers: Not Clinically Stable-   Code Status :  -  Code Status: Full Code   Family Communication:  (patient is alert, awake and coherent)   Consults  :  na  DVT Prophylaxis  :     SCDs Start: 03/26/20 0824 Place TED hose Start: 03/26/20 0824   Lab Results  Component Value Date   PLT 208 03/27/2020    Inpatient Medications  Scheduled Meds: . amLODipine  10 mg Oral QPM  . aspirin EC  81 mg Oral Daily  . atorvastatin  40 mg Oral QPM  . cyanocobalamin  1,000 mcg Intramuscular Once  . guaiFENesin  600 mg Oral BID  . insulin aspart  0-15 Units Subcutaneous TID WC  . insulin aspart  0-5 Units Subcutaneous QHS  . ipratropium-albuterol  3 mL  Nebulization QID  . isosorbide mononitrate  60 mg Oral QPM  . metoprolol succinate  25 mg Oral Daily  . pantoprazole  40 mg Oral Daily  . sodium chloride flush  3 mL Intravenous Q12H  . sodium chloride flush  3 mL Intravenous Q12H   Continuous Infusions: . sodium chloride    . azithromycin Stopped (03/28/20 0742)  . cefTRIAXone (ROCEPHIN)  IV Stopped (03/28/20 0620)   PRN Meds:.sodium chloride, acetaminophen **OR** acetaminophen, albuterol, bisacodyl, HYDROcodone-acetaminophen, nitroGLYCERIN, ondansetron **OR** ondansetron (ZOFRAN) IV, polyethylene glycol, prochlorperazine, sodium chloride flush, tamsulosin, traZODone    Anti-infectives (From admission, onward)   Start     Dose/Rate Route Frequency Ordered Stop   03/27/20 0600  azithromycin (ZITHROMAX) 500 mg in sodium chloride 0.9 % 250 mL IVPB        500 mg 250 mL/hr over 60 Minutes Intravenous Every 24 hours 03/26/20 0618 04/01/20 0559   03/26/20 0630  cefTRIAXone (ROCEPHIN) 2 g in sodium chloride 0.9 % 100 mL IVPB        2 g 200 mL/hr over 30 Minutes Intravenous Every 24 hours 03/26/20 0618 03/31/20 0629   03/26/20 0600  cefTRIAXone (ROCEPHIN) 1 g in sodium chloride 0.9 % 100 mL IVPB  Status:  Discontinued        1 g 200 mL/hr over 30  Minutes Intravenous  Once 03/26/20 0559 03/26/20 0618   03/26/20 0600  azithromycin (ZITHROMAX) 500 mg in sodium chloride 0.9 % 250 mL IVPB  Status:  Discontinued        500 mg 250 mL/hr over 60 Minutes Intravenous Every 24 hours 03/26/20 0559 03/26/20 0618        Objective:   Vitals:   03/28/20 1257 03/28/20 1300 03/28/20 1624 03/28/20 1625  BP:  129/78    Pulse:  94    Resp:  18    Temp:  99.2 F (37.3 C)    TempSrc:  Oral    SpO2: (!) 89% 96% (!) 85% 98%  Weight:      Height:        Wt Readings from Last 3 Encounters:  03/26/20 97.1 kg  12/16/19 97.1 kg  12/14/19 97.3 kg     Intake/Output Summary (Last 24 hours) at 03/28/2020 1741 Last data filed at 03/28/2020 0800 Gross  per 24 hour  Intake 490 ml  Output --  Net 490 ml     Physical Exam  Gen:- Awake Alert,  In no apparent distress  HEENT:- [place].AT, No sclera icterus Nose- [place] 3L/min Neck-Supple Neck,No JVD,.  Lungs-decreased breath sounds, scattered rhonchi on the right  CV- S1, S2 normal,irregular  Abd-  +ve B.Sounds, Abd Soft, No tenderness,    Extremity/Skin:- bilateral lower extremity edema left more than right, intact peripheral pulses  Psych-affect is appropriate, oriented x3 Neuro-generalized weakness , no new focal deficits, no tremors   Data Review:   Micro Results Recent Results (from the past 240 hour(s))  Resp Panel by RT-PCR (Flu A&B, Covid) Nasopharyngeal Swab     Status: None   Collection Time: 03/26/20  3:56 AM   Specimen: Nasopharyngeal Swab; Nasopharyngeal(NP) swabs in vial transport medium  Result Value Ref Range Status   SARS Coronavirus 2 by RT PCR NEGATIVE NEGATIVE Final    Comment: (NOTE) SARS-CoV-2 target nucleic acids are NOT DETECTED.  The SARS-CoV-2 RNA is generally detectable in upper respiratory specimens during the acute phase of infection. The lowest concentration of SARS-CoV-2 viral copies this assay can detect is 138 copies/mL. A negative result does not preclude SARS-Cov-2 infection and should not be used as the sole basis for treatment or other patient management decisions. A negative result may occur with  improper specimen collection/handling, submission of specimen other than nasopharyngeal swab, presence of viral mutation(s) within the areas targeted by this assay, and inadequate number of viral copies(<138 copies/mL). A negative result must be combined with clinical observations, patient history, and epidemiological information. The expected result is Negative.  Fact Sheet for Patients:  EntrepreneurPulse.com.au  Fact Sheet for Healthcare Providers:  IncredibleEmployment.be  This test is no t yet approved or  cleared by the [place] FDA and  has been authorized for detection and/or diagnosis of SARS-CoV-2 by FDA under an Emergency Use Authorization (EUA). This EUA will remain  in effect (meaning this test can be used) for the duration of the COVID-19 declaration under Section 564(b)(1) of the Act, 21 U.S.C.section 360bbb-3(b)(1), unless the authorization is terminated  or revoked sooner.       Influenza A by PCR NEGATIVE NEGATIVE Final   Influenza B by PCR NEGATIVE NEGATIVE Final    Comment: (NOTE) The Xpert Xpress SARS-CoV-2/FLU/RSV plus assay is intended as an aid in the diagnosis of influenza from Nasopharyngeal swab specimens and should not be used as a sole basis for treatment. Nasal washings and aspirates are unacceptable  for Xpert Xpress SARS-CoV-2/FLU/RSV testing.  Fact Sheet for Patients: EntrepreneurPulse.com.au  Fact Sheet for Healthcare Providers: IncredibleEmployment.be  This test is not yet approved or cleared by the [place] FDA and has been authorized for detection and/or diagnosis of SARS-CoV-2 by FDA under an Emergency Use Authorization (EUA). This EUA will remain in effect (meaning this test can be used) for the duration of the COVID-19 declaration under Section 564(b)(1) of the Act, 21 U.S.C. section 360bbb-3(b)(1), unless the authorization is terminated or revoked.  Performed at [facility], [address], [place], [place] [address]   Blood culture (routine x 2)     Status: None (Preliminary result)   Collection Time: 03/26/20  6:17 AM   Specimen: Right Antecubital; Blood  Result Value Ref Range Status   Specimen Description RIGHT ANTECUBITAL  Final   Special Requests   Final    BOTTLES DRAWN AEROBIC AND ANAEROBIC Blood Culture adequate volume   Culture   Final    NO GROWTH 2 DAYS Performed at [facility], [address], [place], [place] [address]    Report Status PENDING  Incomplete  Blood culture (routine  x 2)     Status: None (Preliminary result)   Collection Time: 03/26/20  6:18 AM   Specimen: Left Antecubital; Blood  Result Value Ref Range Status   Specimen Description LEFT ANTECUBITAL  Final   Special Requests   Final    BOTTLES DRAWN AEROBIC AND ANAEROBIC Blood Culture adequate volume   Culture   Final    NO GROWTH 2 DAYS Performed at [facility], [address], [place], [place] [address]    Report Status PENDING  Incomplete    Radiology Reports CT Angio Chest PE W and/or Wo Contrast  Result Date: 03/26/2020 CLINICAL DATA:  Chest pain. EXAM: CT ANGIOGRAPHY CHEST WITH CONTRAST TECHNIQUE: Multidetector CT imaging of the chest was performed using the standard protocol during bolus administration of intravenous contrast. Multiplanar CT image reconstructions and MIPs were obtained to evaluate the vascular anatomy. CONTRAST:  136m OMNIPAQUE IOHEXOL 350 MG/ML SOLN COMPARISON:  None. FINDINGS: Cardiovascular: Heart is enlarged. Status post CABG. Atherosclerotic calcification is noted in the wall of the thoracic aorta. Enlargement of the pulmonary outflow tract and main pulmonary arteries suggests pulmonary arterial hypertension. There is no filling defect within the opacified pulmonary arteries to suggest the presence of an acute pulmonary embolus. 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 Mediastinal lymphadenopathy noted with 1.7 cm short axis right paratracheal node on 40/4. 1.1 cm AP window lymph node identified on 39/4. 1.8 cm short axis subcarinal node on 49/4. 1.4 cm short axis right hilar node is associated with 1.6 cm short axis left hilar node on 53/4. The esophagus has normal imaging features. There is no axillary lymphadenopathy. Lungs/Pleura: Fine architectural detail the lungs is obscured by breathing motion. Centrilobular and paraseptal emphysema evident. Volume loss and airspace consolidation noted right middle lobe and posterior  right lower lobe. There is a small to moderate right pleural effusion associated. Subtle patchy ground-glass attenuation is noted in the left upper lobe. No overtly suspicious nodule or mass. Upper Abdomen: The liver shows diffusely decreased attenuation suggesting fat deposition. Subtle nodular liver contour raises the question of cirrhosis. Subcapsular low-density lesion in the left liver measuring 2.6 cm at the dome cannot be definitively characterized. Musculoskeletal: No worrisome lytic or sclerotic osseous abnormality. Review of the MIP images confirms the above findings. IMPRESSION: 1. No CT evidence  for acute pulmonary embolus. 2. Cardiomegaly. Enlargement of the pulmonary outflow tract and main pulmonary arteries suggests pulmonary arterial hypertension. 3. Volume loss and airspace consolidation in the right middle lobe and posterior right lower lobe. Imaging features in the right middle lobe are highly suggestive of infection. Right lower lobe airspace disease may be related to atelectasis or pneumonia. May be related to atelectasis and/or pneumonia. Associated small to moderate right pleural effusion. 4. Mediastinal and bilateral hilar lymphadenopathy, potentially reactive. Metastatic disease or lymphoma not excluded. Follow-up CT in 3 months recommended to further evaluate. 5. Hepatic steatosis. Subtle nodular liver contour suggests cirrhosis. 2.6 cm low-density lesion in the left liver dome cannot be definitively characterized. When the patient is clinically stable and able to follow directions and hold their breath further evaluation with dedicated abdominal MRI should be considered. 6. Aortic Atherosclerosis (ICD10-I70.0) and Emphysema (ICD10-J43.9). Electronically Signed   By: Misty Stanley M.D.   On: 03/26/2020 05:40   DG Chest Portable 1 View  Result Date: 03/26/2020 CLINICAL DATA:  Chest pain EXAM: PORTABLE CHEST 1 VIEW COMPARISON:  04/24/2018 FINDINGS: 0430 hours. The cardio pericardial  silhouette is enlarged. There is pulmonary vascular congestion without overt pulmonary edema. Interstitial markings are diffusely coarsened with chronic features. Subtle patchy airspace disease noted right base. Bones are demineralized. Telemetry leads overlie the chest. IMPRESSION: 1. Enlargement of the cardiopericardial silhouette with pulmonary vascular congestion. 2. Subtle patchy airspace disease at the right base suspicious for pneumonia 3. Right pleural effusion. Electronically Signed   By: Misty Stanley M.D.   On: 03/26/2020 05:41   US Abdomen Limited RUQ (LIVER/GB)  Result Date: 03/27/2020 CLINICAL DATA:  Cirrhosis. EXAM: ULTRASOUND ABDOMEN LIMITED RIGHT UPPER QUADRANT COMPARISON:  CTA chest 1 day prior. FINDINGS: Gallbladder: No stones. Mild gallbladder wall thickening at 4 mm. Trace pericholecystic fluid. Sonographic Murphy's sign was not elicited. Common bile duct: Diameter: Normal, 8 mm. Liver: Mildly increased hepatic echogenicity. The left hepatic lobe lesion on CT is not visualized. Portal vein is patent on color Doppler imaging with normal direction of blood flow towards the liver. Other: Right-sided pleural effusion incidentally noted. IMPRESSION: No gallstones. Nonspecific mild gallbladder wall thickening and pericholecystic fluid. Mild hepatic steatosis. Right pleural effusion. Electronically Signed   By: Abigail Miyamoto M.D.   On: 03/27/2020 10:06     CBC Recent Labs  Lab 03/26/20 0336 03/27/20 1016  WBC 11.7* 7.2  HGB 8.9* 9.5*  HCT 30.5* 31.6*  PLT 245 208  MCV 86.6 85.9  MCH 25.3* 25.8*  MCHC 29.2* 30.1  RDW 18.1* 17.9*  LYMPHSABS 1.1 1.3  MONOABS 0.9 0.6  EOSABS 0.2 0.2  BASOSABS 0.1 0.0    Chemistries  Recent Labs  Lab 03/26/20 0336 03/27/20 1016  NA 137 138  K 3.8 3.8  CL 107 108  CO2 20* 21*  GLUCOSE 153* 124*  BUN 17 14  CREATININE 0.98 0.83  CALCIUM 8.5* 8.5*  AST  --  18  ALT  --  9  ALKPHOS  --  93  BILITOT  --  1.1    ------------------------------------------------------------------------------------------------------------------ No results for input(s): CHOL, HDL, LDLCALC, TRIG, CHOLHDL, LDLDIRECT in the last 72 hours.  Lab Results  Component Value Date   HGBA1C 6.2 (H) 03/26/2020   No results for input(s): TSH, T4TOTAL, T3FREE, THYROIDAB in the last 72 hours.  Invalid input(s): FREET3 ------------------------------------------------------------------------------------------------------------------ Recent Labs    03/27/20 1016  VITAMINB12 208  FOLATE 14.6  FERRITIN 41  TIBC 419  IRON 25*  Coagulation profile Recent Labs  Lab 03/26/20 0336 03/27/20 1016 03/28/20 0644  INR 3.0* 3.5* 3.4*    Recent Labs    03/26/20 0336  DDIMER 1.01*    Cardiac Enzymes No results for input(s): CKMB, TROPONINI, MYOGLOBIN in the last 168 hours.  Invalid input(s): CK ------------------------------------------------------------------------------------------------------------------    Component Value Date/Time   BNP 343.0 (H) 03/26/2020 0413   Roxan Hockey M.D on 03/28/2020 at 5:41 PM  Go to [email or web address] - for contact info  [facility] - Office  [phone]

## 2020-03-28 NOTE — TOC Initial Note (Signed)
Transition of Care [facility]) - Initial/Assessment Note    Patient Details  Name: Troy [name] MRN: [id number] Date of Birth: 15-Dec-1940  Transition of Care [facility]) CM/SW Contact:    Leitha Bleak, RN Phone Number: 03/28/2020, 3:53 PM  Clinical Narrative:      Patient admitted with Sepsis due to pneumonia, Patient has a high risk for readmission. Patient lives with his wife, still drives himself to appointments. See Dr Assunta Found. He is retired and  independent for [employer]. Discharge planning with patient he needs a rolling walker and bedside commode for home. Due to holidays, [employer] will drop ship to patients home. Patient is aware. Patient is requiring oxygen, Plan to discharge tomorrow. [employer] is aware, TOC will update tomorrow.           Expected Discharge Plan: Home/Self Care Barriers to Discharge: Barriers Resolved   Patient Goals and CMS Choice Patient states their goals for this hospitalization and ongoing recovery are:: to go home. CMS Medicare.gov Compare Post Acute Care list provided to:: Patient Choice offered to / list presented to : Patient  Expected Discharge Plan and Services Expected Discharge Plan: Home/Self Care     Post Acute Care Choice: Durable Medical Equipment Living arrangements for the past 2 months: Single Family Home                 DME Arranged: Dan Humphreys rolling,3-N-1,Oxygen DME Agency: [employer] Date DME Agency Contacted: 03/28/20 Time DME Agency Contacted: 1520 Representative spoke with at DME Agency: cassie     Prior Living Arrangements/Services Living arrangements for the past 2 months: Single Family Home Lives with:: Spouse   Do you feel safe going back to the place where you live?: Yes      Need for Family Participation in Patient Care: Yes (Comment) Care giver support system in place?: Yes (comment)   Criminal Activity/Legal Involvement Pertinent to Current Situation/Hospitalization: No - Comment as needed  Activities of Daily  Living Home Assistive Devices/Equipment: Dan Humphreys (specify type) ADL Screening (condition at time of admission) Patient's cognitive ability adequate to safely complete daily activities?: Yes Is the patient deaf or have difficulty hearing?: No Does the patient have difficulty seeing, even when wearing glasses/contacts?: No Does the patient have difficulty concentrating, remembering, or making decisions?: No Patient able to express need for assistance with ADLs?: Yes Does the patient have difficulty dressing or bathing?: No Independently performs ADLs?: Yes (appropriate for developmental age) Does the patient have difficulty walking or climbing stairs?: Yes Weakness of Legs: Both Weakness of Arms/Hands: None  Permission Sought/Granted    Emotional Assessment     Affect (typically observed): Accepting,Pleasant Orientation: : Oriented to Self,Oriented to Place,Oriented to  Time,Oriented to Situation Alcohol / Substance Use: Not Applicable Psych Involvement: No (comment)  Admission diagnosis:  Community acquired pneumonia [J18.9] Atypical chest pain [R07.89] Hypoxia [R09.02] Community acquired pneumonia of right middle lobe of lung [J18.9] Sepsis (HCC) [A41.9] Patient Active Problem List   Diagnosis Date Noted  . Rt Sided Community acquired pneumonia 03/26/2020  . Microcytic anemia 03/26/2020  . 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 ----Pulmonary HTN  03/26/2020  . Sepsis due to Rt Sided Pneumonia 03/26/2020  . Atrial fibrillation (HCC) 12/24/2014  . Bowel habit changes 09/16/2014  . Erosive esophagitis 02/11/2014  . Abdominal aneurysm without mention of rupture 07/28/2013  . Peripheral vascular disease, unspecified (HCC) 07/28/2013  . Esophageal dysphagia 07/21/2013  . CAD (coronary artery disease)   . Type 2 diabetes mellitus (HCC) 09/27/2012  .  Hypertension 09/27/2012  . Atherosclerosis of native arteries of the extremities with intermittent claudication 02/12/2012  . Limb  swelling 02/12/2012  . 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 Neck pain   . Dizziness   . Carotid artery disease (HCC)   . Renal artery stenosis (HCC)   . S/P femoropopliteal bypass surgery   . Ejection fraction   . Sinus bradycardia   . Hyperlipidemia 01/04/2010  . EDEMA 07/04/2008  . CORONARY ARTERY BYPASS GRAFT, HX OF 07/04/2008   PCP:  Assunta Found, MD Pharmacy:   [facility] ORDER) ELECTRONIC - [place], [place] - [address] [place] [address] Phone: [phone] Fax: [phone]  [facility] [address], [place] - [address] [place] [address] [place] [address] [place] [address] Phone: [phone] Fax: [phone]  [facility] - [place], [place] - [address] [place] [address] Phone: [phone] Fax: [phone]   Readmission Risk Interventions No flowsheet data found.  No hyperlink to document, see note

## 2020-03-29 DIAGNOSIS — J189 Pneumonia, unspecified organism: Secondary | ICD-10-CM | POA: Diagnosis not present

## 2020-03-29 LAB — GLUCOSE, CAPILLARY
Glucose-Capillary: 96 mg/dL (ref 70–99)
Glucose-Capillary: 118 mg/dL — ABNORMAL HIGH (ref 70–99)
Glucose-Capillary: 108 mg/dL — ABNORMAL HIGH (ref 70–99)

## 2020-03-29 LAB — PROTIME-INR
INR: 2.6 — ABNORMAL HIGH (ref 0.8–1.2)
Prothrombin Time: 26.6 seconds — ABNORMAL HIGH (ref 11.4–15.2)

## 2020-03-29 MED ORDER — POTASSIUM CHLORIDE CRYS ER 10 MEQ PO TBCR: 10.0000 meq | EXTENDED_RELEASE_TABLET | ORAL | 1 refills | Status: DC

## 2020-03-29 MED ORDER — AZITHROMYCIN 500 MG PO TABS: 500.0000 mg | ORAL_TABLET | Freq: Every day | ORAL | 0 refills | Status: AC

## 2020-03-29 MED ORDER — ASPIRIN 81 MG PO TBEC: 81.0000 mg | DELAYED_RELEASE_TABLET | Freq: Every day | ORAL | 3 refills | Status: AC

## 2020-03-29 MED ORDER — SODIUM CHLORIDE 0.9 % IV SOLN: 500.0000 mg | INTRAVENOUS | Status: DC

## 2020-03-29 MED ORDER — AMLODIPINE BESYLATE 5 MG PO TABS: 5.0000 mg | ORAL_TABLET | Freq: Every day | ORAL | 4 refills | Status: DC

## 2020-03-29 MED ORDER — GUAIFENESIN ER 600 MG PO TB12: 600.0000 mg | ORAL_TABLET | Freq: Two times a day (BID) | ORAL | 0 refills | Status: DC

## 2020-03-29 MED ORDER — TORSEMIDE 10 MG PO TABS: 10.0000 mg | ORAL_TABLET | Freq: Every day | ORAL | 3 refills | Status: DC

## 2020-03-29 MED ORDER — LOSARTAN POTASSIUM 25 MG PO TABS: 25.0000 mg | ORAL_TABLET | Freq: Every day | ORAL | 4 refills | Status: DC

## 2020-03-29 MED ORDER — WARFARIN - PHARMACIST DOSING INPATIENT: Freq: Every day | Status: DC

## 2020-03-29 MED ORDER — IPRATROPIUM-ALBUTEROL 0.5-2.5 (3) MG/3ML IN SOLN: 3.0000 mL | RESPIRATORY_TRACT | 1 refills | Status: DC | PRN

## 2020-03-29 MED ORDER — IPRATROPIUM-ALBUTEROL 0.5-2.5 (3) MG/3ML IN SOLN
3.0000 mL | Freq: Four times a day (QID) | RESPIRATORY_TRACT | Status: DC
  Administered 2020-03-29: 15:00:00 3 mL via RESPIRATORY_TRACT
  Filled 2020-03-29: qty 3

## 2020-03-29 MED ORDER — ACETAMINOPHEN 325 MG PO TABS: 650.0000 mg | ORAL_TABLET | Freq: Four times a day (QID) | ORAL | 0 refills | Status: DC | PRN

## 2020-03-29 MED ORDER — CEFDINIR 300 MG PO CAPS: 300.0000 mg | ORAL_CAPSULE | Freq: Two times a day (BID) | ORAL | 0 refills | Status: AC

## 2020-03-29 MED ORDER — WARFARIN SODIUM 1 MG PO TABS
1.0000 mg | ORAL_TABLET | Freq: Once | ORAL | Status: AC
  Administered 2020-03-29: 16:00:00 1 mg via ORAL
  Filled 2020-03-29: qty 1

## 2020-03-29 MED ORDER — ALBUTEROL SULFATE HFA 108 (90 BASE) MCG/ACT IN AERS: 2.0000 | INHALATION_SPRAY | Freq: Four times a day (QID) | RESPIRATORY_TRACT | 2 refills | Status: AC | PRN

## 2020-03-29 NOTE — Discharge Summary (Signed)
EPHRAM KORNEGAY, is a 79 y.o. male  DOB 06-11-1940  MRN [id number].  Admission date:  03/26/2020  Admitting Physician  Roxan Hockey, MD  Discharge Date:  03/29/2020   Primary MD  Sharilyn Sites, MD  Recommendations for primary care physician for things to follow:   1)Very low-salt diet advised 2)Weigh yourself daily, call if you gain more than 3 pounds in 1 day or more than 5 pounds in 1 week as your diuretic medications may need to be adjusted 3)Limit your Fluid  intake to no more than 60 ounces (1.8 Liters) per day 4)Avoid ibuprofen/Advil/Aleve/Motrin/Goody Powders/Naproxen/BC powders/Meloxicam/Diclofenac/Indomethacin and other Nonsteroidal anti-inflammatory medications as these will make you more likely to bleed and can cause stomach ulcers, can also cause Kidney problems.  5)you need oxygen at home at 2 L via nasal cannula continuously while awake and while asleep--- smoking or having open fires around oxygen can cause fire, significant injury and death 2022-05-19) please take Omnicef/cefdinir and azithromycin antibiotic as prescribed for pneumonia 7) please keep your outpatient appointment with advance Heart Failure Cardiology follow-up with Dr Marigene Ehlers on 04/04/2020 8) please keep your outpatient appointment for pulmonary function test/PFT and also for your VQ scan/nuclear medicine test--- as previously scheduled by her primary care physician 9) please recheck your PT INR and CBC blood test within the next 5 days  Admission Diagnosis  Community acquired pneumonia [J18.9] Atypical chest pain [R07.89] Hypoxia [R09.02] Community acquired pneumonia of right middle lobe of lung [J18.9] Sepsis ([place]) [A41.9]   Discharge Diagnosis  Community acquired pneumonia [J18.9] Atypical chest pain [R07.89] Hypoxia [R09.02] Community acquired pneumonia of right middle lobe of lung [J18.9] Sepsis ([place]) [A41.9]    Principal  Problem:   Sepsis due to Rt Sided Pneumonia Active Problems:   Rt Sided Community acquired pneumonia   --Severe pulmonary HTN, 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 ----Pulmonary HTN    Atrial fibrillation (HCC)   Hyperlipidemia   S/P femoropopliteal bypass surgery   Type 2 diabetes mellitus (HCC)   Hypertension   CAD (coronary artery disease)   Microcytic anemia      Past Medical History:  Diagnosis Date  . AAA (abdominal aortic aneurysm) ([place])    Followed by Dr. Curt Jews  . Arthritis   . CAD (coronary artery disease)    a. CABG 2000.  [name])    Prostate:  Radiation Tx  . Carotid artery disease ([place])   . Chest pain    precordial. mild chronic .[name]... nonischemic  . Chronic edema   . Coronary artery disease    a. Nuclear, January, 2008, no ischemia b. Cath 08/2012- 1/4 patent grafts, RCA CTO, no flow-limiting disease, medically managed  . Diabetes mellitus without complication ([place])   . Dizziness 02/2011  . Dyslipidemia   . GERD (gastroesophageal reflux disease)    TAKES TUMS & ROLAIDS AS NEEDED  . Hx of CABG 2000  . Hypertension   . Myocardial infarction ([place]) 1999  . Neck pain 02/2011  . Pneumonia   . Pulmonary hypertension ([place])   .  Renal artery stenosis (HCC)    50-70%  . S/P femoropopliteal bypass surgery    Dr. Donnetta Hutching  . Sinus bradycardia    Asymptomatic    Past Surgical History:  Procedure Laterality Date  . CARDIAC CATHETERIZATION  09/26/2012   1/4 patent bypass (occluded SVG-PDA, SVG-OM, LIMA-LAD), SVG-diagonal patent and fills the diagonal and LAD, distal RCA occlusion with left to right collateralization, patent circumflex, LAD with no flow-limiting disease and antegrade flow competitively from SVG-diagonal; EF 60-65%  . COLONOSCOPY  11/30/2009   [id number]. next TCS 11/2019  . COLONOSCOPY WITH PROPOFOL N/A 12/18/2019   Procedure: COLONOSCOPY WITH PROPOFOL;  Surgeon: Harvel Quale, MD;  [facility];  Service: Gastroenterology;   Laterality: N/A;  1030  . CORONARY ARTERY BYPASS GRAFT  2000  . ESOPHAGOGASTRODUODENOSCOPY N/A 08/12/2013   ESL:[id number] peptic stricture with erosive refluxesophagitis - status post Maloney dilation. Hiatal hernia. Abnormalgastric mucosa. Deformity of the pyloric channel suggestive ofprior peptic ulcer disease. Duodenal bulbar diverticulum Statuspost gastric biopsy. h.pylori  . LEFT HEART CATHETERIZATION WITH CORONARY/GRAFT ANGIOGRAM N/A 09/26/2012   Procedure: LEFT HEART CATHETERIZATION WITH Beatrix Fetters;  Surgeon: Burnell Blanks, MD;  Location: [facility] CATH LAB;  Service: Cardiovascular;  Laterality: N/A;  . Venia Minks DILATION N/A 08/12/2013   Procedure: Venia Minks DILATION;  Surgeon: Daneil Dolin, MD;  Location: [facility];  Service: Endoscopy;  Laterality: N/A;  . POLYPECTOMY  12/18/2019   Procedure: POLYPECTOMY;  Surgeon: Harvel Quale, MD;  Location[facility];  Service: Gastroenterology;;  ascending colon polyp   . PR VEIN BYPASS GRAFT,AORTO-FEM-POP Right 07/19/1999  . PR VEIN BYPASS GRAFT,AORTO-FEM-POP Left 05/02/2006  . SAVORY DILATION N/A 08/12/2013   Procedure: SAVORY DILATION;  Surgeon: Daneil Dolin, MD;  [facility];  Service: Endoscopy;  Laterality: N/A;  . WRIST SURGERY     cyst removal     HPI  from the history and physical done on the day of admission:    Rigdon Macomber  is a 79 y.o. 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, dysphagia with oropharyngeal and UES dysfunction with prior  dilatation of his UES presents with cough and right-sided chest discomfort and found to have right-sided pneumonia  --Presented to the ED with right-sided chest discomfort and shortness of breath, as well as cough -Patient stated that the right-sided chest pain actually woke him up from sleep, he actually took aspirin and nitroglycerin x2 with some relief -He never had left-sided chest pains, no pleuritic symptoms he has chronic leg swelling but no new leg pains -In the ED he was found to be hypoxic with O2 sats of 85% on room air required up to 4 L of oxygen  --Additional history obtained from daughter at bedside -patient is fully vaccinated against COVID-19, he also got his Moderna booster No fever  Or chills  - In ED--CT chest without acute PE, patient is found to have right-sided pneumonia small to moderate right-sided pleural effusion and with mediastinal bilateral hilar lymphadenopathy, as well as hepatic steatosis and concern for liver cirrhosis In ED--BNP is elevated at 343, no baseline available -  WBC is 11.7 hemoglobin 8.9 which is lower than previous baseline -Troponin x2 is 6 and 5 respectively -Blood cultures obtained and IV antibiotics administered      Hospital Course:   Brief Summary 30 year oldmalewith extensive past medical history includingCAD s/p CABG 2000, permanent atrial fibrillation,RBBB,severepulmonary HTN(PASP is 75 mmhg), HLD, HTN, , PVD/PADwith prior fem-pop bypass grafting, infrarenal abdominal aortic aneurysm (4.7cm 10/2019), prior renal artery stenosis,carotid artery stenosis,andchronic venous insuffiency with edema(Lt > Rt), DM2, prostate CA, andesophageal stricture requiring dilatation,as well as 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, dysphagia with oropharyngeal and UES dysfunction with prior dilatation of hisUESpresents with cough and right-sided chest discomfort and found to have right-sided pneumonia  A/p 1)Rt  Sided PNA-- pt with H/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, dysphagia with oropharyngeal and UES dysfunction---He has h/o recurrent aspiration PNA =--- Cough and shob improved -Hypoxia persist, requiring 2 L of oxygen via nasal cannula -Treated with Riocephin/Azith,  -Discharge home on Omnicef and azithromycin  =-Continue bronchodilators and mucolytics  2)SeverePulmonary HTN(PASP is 75 mmhg)---  -Low clinical index of suspicion for recurrent Pulm embolism (on coumadin) , CTA chest this admission without acute PE - will get PFTs and VQ scan and outpt advance Heart Failure Cardiology follow-up with Dr Marigene Ehlers on 04/04/2020 --BNP is elevated at 343, no baseline available -Discharge home on home O2 - 3)H/o CAD--s/p prior CABG in 2000,low clinical index of suspicion for ACS Last LHC in 2017with single-vessel disease at that time, medical management was advised at that time -Continue aspirin and Lipitor,as well as isosorbide and metoprolol -Troponin x2 is 6 and 5 respectively -Remains chest pain-free -Keep appointment with cardiologist Dr. Marigene Ehlers on January 3rd, 2020  4)DysphoniaandDysphagia---patient sees ENT at [facility]  his dysphonia is due to right vocal fold immobility - HisDysphagia is due to- oropharyngeal and UES dysfunction-status post prior UES dilatation -patient with history of recurrent aspiration. -Possible aspiration pneumonia as above #1  5)Permanent atrial fibrillation--continue Coumadin for anticoagulation,metoprolol for rate control  6)Essential HTN--stable, continue amlodipine 10 mg daily, metoprolol and isosorbide as ordered  7)DM2 -A1c 6.2,, reflecting excellent diabetic control PTA -Continue PTA regimen  8)PAD/PVD--with prior fem-pop bypass grafting, infrarenal abdominal aortic aneurysm (4.7cm 10/2019), prior renal artery stenosis,carotid artery stenosis----continue aspirin and Coumadin therapy and  atorvastatin  9)Mediastinal and bilateral hilar lymphadenopathy, potentially reactive. Metastatic disease or lymphoma not excluded---Follow-up CT in 3 months recommended to further evaluate.  10)Hepatic Steatosis-----CT suggestSubtle nodular liver contour suggests cirrhosis. 2.6 cm low-density lesion in the left liver dome cannot be definitively characterized. -Liver ultrasound consistent with mild hepatic steatosis, --- may need abdominal MRI as outpatient.  11)Acute on chronic iron deficiency anemia hemoglobin 8.9 which is lower than previous baseline -Ferritin low normal at 41 -Vitamin B12 is low normal at 208 -Folate is normal at 14.6 -Serum iron is low at 22 with iron saturation of 6% and TIBC of 419 -Patient is chronically on Coumadin, no concerns about significant bleeding at this time  12)acute hypoxic respiratory failure---secondary to #1 above, #2 is probably contributory, patient may need home O2 at discharge -Currently requiring 2 L of nasal cannula -Discharge home on home O2 due to combination of pneumonia and severe pulmonary hypertension  13)Sepsis secondary to right-sided pneumonia--patient met sepsis criteria on admission with leukocytosis with WBC of 11.7, tachypnea with respiratory rate of 23,as well as hypoxia requiring oxygen via nasal cannula -Treat as above #1 pending blood cultures -No tachycardia most likely because patient is on metoprolol  Disposition-discharged home  with home oxygen in stable condition   Code Status :  -  Code Status: Full Code   Family Communication:  (patient is alert, awake and coherent)  Called and updated patient's daughter, questions answered  Consults  :  na  DVT Prophylaxis  :     SCDs Start: 03/26/20 0824 Place TED hose Start: 03/26/20 0824  Discharge Condition: stable, needs home oxygen at 2 L/min  Follow UP   [facility] Follow up.   Why: [employer] - for home oxygen   They will ship rolling walker and bed side commode Contact information: [address] [place] [address] [phone]                Consults obtained -na  Diet and Activity recommendation:  As advised  Discharge Instructions    Discharge Instructions    Call MD for:  difficulty breathing, headache or visual disturbances   Complete by: As directed    Call MD for:  persistant dizziness or light-headedness   Complete by: As directed    Call MD for:  persistant nausea and vomiting   Complete by: As directed    Call MD for:  temperature >100.4   Complete by: As directed    Diet - low sodium heart healthy   Complete by: As directed    Discharge instructions   Complete by: As directed    1)Very low-salt diet advised 2)Weigh yourself daily, call if you gain more than 3 pounds in 1 day or more than 5 pounds in 1 week as your diuretic medications may need to be adjusted 3)Limit your Fluid  intake to no more than 60 ounces (1.8 Liters) per day 4)Avoid ibuprofen/Advil/Aleve/Motrin/Goody Powders/Naproxen/BC powders/Meloxicam/Diclofenac/Indomethacin and other Nonsteroidal anti-inflammatory medications as these will make you more likely to bleed and can cause stomach ulcers, can also cause Kidney problems.  5)you need oxygen at home at 2 L via nasal cannula continuously while awake and while asleep--- smoking or having open fires around oxygen can cause fire, significant injury and death June 04, 2022) please take Omnicef/cefdinir and azithromycin antibiotic as prescribed for pneumonia 7) please keep your outpatient appointment with advance Heart Failure Cardiology follow-up with Dr Marigene Ehlers on 04/04/2020 8) please keep your outpatient appointment for pulmonary function test/PFT and also for your VQ scan/nuclear medicine test--- as previously scheduled by her primary care physician 9) please recheck your PT INR and CBC blood test within the next 5 days   Increase activity slowly   Complete by:  As directed         Discharge Medications     Allergies as of 03/29/2020      Reactions   Niacin Itching, Rash   Burning sensation   Iodine-131 Nausea And Vomiting   Nsaids Other (See Comments)   Taking Coumadin      Medication List    STOP taking these medications   polyethylene glycol-electrolytes 420 g solution Commonly known as: NuLYTELY   tamsulosin 0.4 MG Caps capsule Commonly known as: FLOMAX     TAKE these medications   acetaminophen 325 MG tablet Commonly known as: TYLENOL Take 2 tablets (650 mg total) by mouth every 6 (six) hours as needed for mild pain, fever or headache (or Fever >/= 101).   albuterol 108 (90 Base) MCG/ACT inhaler Commonly known as: VENTOLIN HFA Inhale 2 puffs into the lungs every 6 (six) hours as needed for wheezing or shortness of breath.   amLODipine 5 MG  tablet Commonly known as: NORVASC Take 1 tablet (5 mg total) by mouth daily. For BP What changed:   medication strength  how much to take  when to take this  additional instructions   aspirin 81 MG EC tablet Take 1 tablet (81 mg total) by mouth daily with breakfast. What changed: when to take this   atorvastatin 40 MG tablet Commonly known as: LIPITOR Take 1 tablet (40 mg total) by mouth daily. What changed: when to take this   azithromycin 500 MG tablet Commonly known as: ZITHROMAX Take 1 tablet (500 mg total) by mouth daily for 5 days.   cefdinir 300 MG capsule Commonly known as: OMNICEF Take 1 capsule (300 mg total) by mouth 2 (two) times daily for 5 days.   guaiFENesin 600 MG 12 hr tablet Commonly known as: MUCINEX Take 1 tablet (600 mg total) by mouth 2 (two) times daily.   HYDROcodone-acetaminophen 5-325 MG tablet Commonly known as: NORCO/VICODIN Take 1 tablet by mouth daily as needed for moderate pain.   ipratropium 0.03 % nasal spray Commonly known as: ATROVENT Place 2 sprays into both nostrils 2 (two) times daily.   ipratropium-albuterol 0.5-2.5  (3) MG/3ML Soln Commonly known as: DUONEB Take 3 mLs by nebulization every 4 (four) hours as needed.   isosorbide mononitrate 60 MG 24 hr tablet Commonly known as: IMDUR Take 60 mg by mouth every evening.   losartan 25 MG tablet Commonly known as: COZAAR Take 1 tablet (25 mg total) by mouth daily. What changed:   medication strength  how much to take   metFORMIN 1000 MG tablet Commonly known as: GLUCOPHAGE Take 1 tablet (1,000 mg total) by mouth 2 (two) times daily with a meal. What changed: additional instructions   metoprolol succinate 25 MG 24 hr tablet Commonly known as: Toprol XL Take 1 tablet (25 mg total) by mouth daily.   nitroGLYCERIN 0.4 MG SL tablet Commonly known as: NITROSTAT Place 1 tablet (0.4 mg total) under the tongue every 5 (five) minutes x 3 doses as needed for chest pain.   pantoprazole 40 MG tablet Commonly known as: PROTONIX TAKE 1 TABLET EVERY DAY What changed: when to take this   potassium chloride 10 MEQ tablet Commonly known as: KLOR-CON Take 1 tablet (10 mEq total) by mouth every Tuesday, Thursday, Saturday, and Sunday. Only take while taking Torsemide What changed:   medication strength  how much to take  how to take this  when to take this  additional instructions   tetrahydrozoline-zinc 0.05-0.25 % ophthalmic solution Commonly known as: VISINE-AC 2 drops 3 (three) times daily as needed.   torsemide 10 MG tablet Commonly known as: DEMADEX Take 1 tablet (10 mg total) by mouth daily with breakfast. Take 20 mg for weight gain over 3 lbs in 24 hours What changed:   medication strength  how much to take  how to take this  when to take this   warfarin 2 MG tablet Commonly known as: COUMADIN Take 2 mg by mouth every evening. MANAGED BY PMD            Durable Medical Equipment  (From admission, onward)         Start     Ordered   03/28/20 1726  For home use only DME oxygen  Once       Comments: SATURATION  QUALIFICATIONS: (This note is used to comply with regulatory documentation for home oxygen)  Patient Saturations on Room Air at Rest = 92%  Patient Saturations on Room Air while Ambulating = 85%  Patient Saturations on 2 Liters of oxygen while Ambulating = 95%  Please briefly explain why patient needs home oxygen:  While ambulating on room air oxygen level dropped to 85% and pt states having short of breath.  Question Answer Comment  Length of Need Lifetime   Mode or (Route) Nasal cannula   Liters per Minute 2   Frequency Continuous (stationary and portable oxygen unit needed)   Oxygen conserving device Yes   Oxygen delivery system Gas      03/28/20 1727   03/28/20 1523  For home use only DME 4 wheeled rolling walker with seat  Once       Question Answer Comment  Patient needs a walker to treat with the following condition Sepsis due to pneumonia [facility])   Patient needs a walker to treat with the following condition Pulmonary HTN ([place])   Patient needs a walker to treat with the following condition A-fib [facility])      03/28/20 1524          Major procedures and Radiology Reports - PLEASE review detailed and final reports for all details, in brief -   CT Angio Chest PE W and/or Wo Contrast  Result Date: 03/26/2020 CLINICAL DATA:  Chest pain. EXAM: CT ANGIOGRAPHY CHEST WITH CONTRAST TECHNIQUE: Multidetector CT imaging of the chest was performed using the standard protocol during bolus administration of intravenous contrast. Multiplanar CT image reconstructions and MIPs were obtained to evaluate the vascular anatomy. CONTRAST:  11m OMNIPAQUE IOHEXOL 350 MG/ML SOLN COMPARISON:  None. FINDINGS: Cardiovascular: Heart is enlarged. Status post CABG. Atherosclerotic calcification is noted in the wall of the thoracic aorta. Enlargement of the pulmonary outflow tract and main pulmonary arteries suggests pulmonary arterial hypertension. There is no filling defect within the opacified  pulmonary arteries to suggest the presence of an acute pulmonary embolus. 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 Mediastinal lymphadenopathy noted with 1.7 cm short axis right paratracheal node on 40/4. 1.1 cm AP window lymph node identified on 39/4. 1.8 cm short axis subcarinal node on 49/4. 1.4 cm short axis right hilar node is associated with 1.6 cm short axis left hilar node on 53/4. The esophagus has normal imaging features. There is no axillary lymphadenopathy. Lungs/Pleura: Fine architectural detail the lungs is obscured by breathing motion. Centrilobular and paraseptal emphysema evident. Volume loss and airspace consolidation noted right middle lobe and posterior right lower lobe. There is a small to moderate right pleural effusion associated. Subtle patchy ground-glass attenuation is noted in the left upper lobe. No overtly suspicious nodule or mass. Upper Abdomen: The liver shows diffusely decreased attenuation suggesting fat deposition. Subtle nodular liver contour raises the question of cirrhosis. Subcapsular low-density lesion in the left liver measuring 2.6 cm at the dome cannot be definitively characterized. Musculoskeletal: No worrisome lytic or sclerotic osseous abnormality. Review of the MIP images confirms the above findings. IMPRESSION: 1. No CT evidence for acute pulmonary embolus. 2. Cardiomegaly. Enlargement of the pulmonary outflow tract and main pulmonary arteries suggests pulmonary arterial hypertension. 3. Volume loss and airspace consolidation in the right middle lobe and posterior right lower lobe. Imaging features in the right middle lobe are highly suggestive of infection. Right lower lobe airspace disease may be related to atelectasis or pneumonia. May be related to atelectasis and/or pneumonia. Associated small to moderate right pleural effusion. 4. Mediastinal and bilateral hilar lymphadenopathy, potentially reactive. Metastatic  disease or lymphoma not excluded. Follow-up CT in 3 months recommended to further evaluate. 5. Hepatic steatosis. Subtle nodular liver contour suggests cirrhosis. 2.6 cm low-density lesion in the left liver dome cannot be definitively characterized. When the patient is clinically stable and able to follow directions and hold their breath further evaluation with dedicated abdominal MRI should be considered. 6. Aortic Atherosclerosis (ICD10-I70.0) and Emphysema (ICD10-J43.9). Electronically Signed   By: Misty Stanley M.D.   On: 03/26/2020 05:40   DG Chest Portable 1 View  Result Date: 03/26/2020 CLINICAL DATA:  Chest pain EXAM: PORTABLE CHEST 1 VIEW COMPARISON:  04/24/2018 FINDINGS: 0430 hours. The cardio pericardial silhouette is enlarged. There is pulmonary vascular congestion without overt pulmonary edema. Interstitial markings are diffusely coarsened with chronic features. Subtle patchy airspace disease noted right base. Bones are demineralized. Telemetry leads overlie the chest. IMPRESSION: 1. Enlargement of the cardiopericardial silhouette with pulmonary vascular congestion. 2. Subtle patchy airspace disease at the right base suspicious for pneumonia 3. Right pleural effusion. Electronically Signed   By: Misty Stanley M.D.   On: 03/26/2020 05:41   US Abdomen Limited RUQ (LIVER/GB)  Result Date: 03/27/2020 CLINICAL DATA:  Cirrhosis. EXAM: ULTRASOUND ABDOMEN LIMITED RIGHT UPPER QUADRANT COMPARISON:  CTA chest 1 day prior. FINDINGS: Gallbladder: No stones. Mild gallbladder wall thickening at 4 mm. Trace pericholecystic fluid. Sonographic Murphy's sign was not elicited. Common bile duct: Diameter: Normal, 8 mm. Liver: Mildly increased hepatic echogenicity. The left hepatic lobe lesion on CT is not visualized. Portal vein is patent on color Doppler imaging with normal direction of blood flow towards the liver. Other: Right-sided pleural effusion incidentally noted. IMPRESSION: No gallstones. Nonspecific  mild gallbladder wall thickening and pericholecystic fluid. Mild hepatic steatosis. Right pleural effusion. Electronically Signed   By: Abigail Miyamoto M.D.   On: 03/27/2020 10:06    Micro Results  Recent Results (from the past 240 hour(s))  Resp Panel by RT-PCR (Flu A&B, Covid) Nasopharyngeal Swab     Status: None   Collection Time: 03/26/20  3:56 AM   Specimen: Nasopharyngeal Swab; Nasopharyngeal(NP) swabs in vial transport medium  Result Value Ref Range Status   SARS Coronavirus 2 by RT PCR NEGATIVE NEGATIVE Final    Comment: (NOTE) SARS-CoV-2 target nucleic acids are NOT DETECTED.  The SARS-CoV-2 RNA is generally detectable in upper respiratory specimens during the acute phase of infection. The lowest concentration of SARS-CoV-2 viral copies this assay can detect is 138 copies/mL. A negative result does not preclude SARS-Cov-2 infection and should not be used as the sole basis for treatment or other patient management decisions. A negative result may occur with  improper specimen collection/handling, submission of specimen other than nasopharyngeal swab, presence of viral mutation(s) within the areas targeted by this assay, and inadequate number of viral copies(<138 copies/mL). A negative result must be combined with clinical observations, patient history, and epidemiological information. The expected result is Negative.  Fact Sheet for Patients:  EntrepreneurPulse.com.au  Fact Sheet for Healthcare Providers:  IncredibleEmployment.be  This test is no t yet approved or cleared by the [place] FDA and  has been authorized for detection and/or diagnosis of SARS-CoV-2 by FDA under an Emergency Use Authorization (EUA). This EUA will remain  in effect (meaning this test can be used) for the duration of the COVID-19 declaration under Section 564(b)(1) of the Act, 21 U.S.C.section 360bbb-3(b)(1), unless the authorization is terminated  or revoked  sooner.       Influenza A by PCR NEGATIVE NEGATIVE Final  Influenza B by PCR NEGATIVE NEGATIVE Final    Comment: (NOTE) The Xpert Xpress SARS-CoV-2/FLU/RSV plus assay is intended as an aid in the diagnosis of influenza from Nasopharyngeal swab specimens and should not be used as a sole basis for treatment. Nasal washings and aspirates are unacceptable for Xpert Xpress SARS-CoV-2/FLU/RSV testing.  Fact Sheet for Patients: EntrepreneurPulse.com.au  Fact Sheet for Healthcare Providers: IncredibleEmployment.be  This test is not yet approved or cleared by the [place] FDA and has been authorized for detection and/or diagnosis of SARS-CoV-2 by FDA under an Emergency Use Authorization (EUA). This EUA will remain in effect (meaning this test can be used) for the duration of the COVID-19 declaration under Section 564(b)(1) of the Act, 21 U.S.C. section 360bbb-3(b)(1), unless the authorization is terminated or revoked.  Performed at [facility], [address], [place], [place] [address]   Blood culture (routine x 2)     Status: None (Preliminary result)   Collection Time: 03/26/20  6:17 AM   Specimen: Right Antecubital; Blood  Result Value Ref Range Status   Specimen Description RIGHT ANTECUBITAL  Final   Special Requests   Final    BOTTLES DRAWN AEROBIC AND ANAEROBIC Blood Culture adequate volume   Culture   Final    NO GROWTH 3 DAYS Performed at [facility], [address] [place], [place] [address]    Report Status PENDING  Incomplete  Blood culture (routine x 2)     Status: None (Preliminary result)   Collection Time: 03/26/20  6:18 AM   Specimen: Left Antecubital; Blood  Result Value Ref Range Status   Specimen Description LEFT ANTECUBITAL  Final   Special Requests   Final    BOTTLES DRAWN AEROBIC AND ANAEROBIC Blood Culture adequate volume   Culture   Final    NO GROWTH 3 DAYS Performed at [facility], [address],  [place], [place] [address]    Report Status PENDING  Incomplete       Today   Subjective    Kester Stimpson today has no new complaints  -Cough and shortness of breath is improved, continues to require oxygen with activity but no longer requiring oxygen at rest  No fever  Or chills   No Nausea, Vomiting or Diarrhea        Patient has been seen and examined prior to discharge   Objective   Blood pressure (!) 107/45, pulse 62, temperature 98.2 F (36.8 C), resp. rate 20, height '5\' 10"'  (1.778 m), weight 97.1 kg, SpO2 96 %.   Intake/Output Summary (Last 24 hours) at 03/29/2020 1524 Last data filed at 03/29/2020 1300 Gross per 24 hour  Intake 720 ml  Output --  Net 720 ml    Exam Gen:- Awake Alert, no acute distress  HEENT:- Wing.AT, No sclera icterus Nose- [place] 2L/min Neck-Supple Neck,No JVD,.  Lungs-improved air movement bilaterally, no wheeze  CV- S1, S2 normal, regular Abd-  +ve B.Sounds, Abd Soft, No tenderness,   Extremity/Skin:- No  edema,   good pulses Psych-affect is appropriate, oriented x3 Neuro-no new focal deficits, no tremors    Data Review   CBC w Diff:  Lab Results  Component Value Date   WBC 7.2 03/27/2020   HGB 9.5 (L) 03/27/2020   HCT 31.6 (L) 03/27/2020   PLT 208 03/27/2020   LYMPHOPCT 19 03/27/2020   MONOPCT 9 03/27/2020   EOSPCT 3 03/27/2020   BASOPCT 1 03/27/2020    CMP:  Lab Results  Component Value Date  NA 138 03/27/2020   K 3.8 03/27/2020   CL 108 03/27/2020   CO2 21 (L) 03/27/2020   BUN 14 03/27/2020   CREATININE 0.83 03/27/2020   PROT 7.2 03/27/2020   ALBUMIN 3.8 03/27/2020   BILITOT 1.1 03/27/2020   ALKPHOS 93 03/27/2020   AST 18 03/27/2020   ALT 9 03/27/2020  .   Total Discharge time is about 33 minutes  Roxan Hockey M.D on 03/29/2020 at 3:24 PM  Go to [email or web address] -  for contact info  [facility] - Office  [phone]

## 2020-03-29 NOTE — Discharge Instructions (Signed)
1)Very low-salt diet advised 2)Weigh yourself daily, call if you gain more than 3 pounds in 1 day or more than 5 pounds in 1 week as your diuretic medications may need to be adjusted 3)Limit your Fluid  intake to no more than 60 ounces (1.8 Liters) per day 4)Avoid ibuprofen/Advil/Aleve/Motrin/Goody Powders/Naproxen/BC powders/Meloxicam/Diclofenac/Indomethacin and other Nonsteroidal anti-inflammatory medications as these will make you more likely to bleed and can cause stomach ulcers, can also cause Kidney problems.  5)you need oxygen at home at 2 L via nasal cannula continuously while awake and while asleep--- smoking or having open fires around oxygen can cause fire, significant injury and death 6) please take Omnicef/cefdinir and azithromycin antibiotic as prescribed for pneumonia 7) please keep your outpatient appointment with advance Heart Failure Cardiology follow-up with Dr Jearld Pies on 04/04/2020 8) please keep your outpatient appointment for pulmonary function test/PFT and also for your VQ scan/nuclear medicine test--- as previously scheduled by her primary care physician 9) please recheck your PT INR and CBC blood test within the next 5 days

## 2020-03-29 NOTE — Progress Notes (Signed)
ANTICOAGULATION CONSULT NOTE -  Pharmacy Consult for warfarin Indication: atrial fibrillation  Allergies  Allergen Reactions  . Niacin Itching and Rash    Burning sensation  . Iodine-131 Nausea And Vomiting  . Nsaids Other (See Comments)    Taking Coumadin    Patient Measurements: Height: 5\' 10"  (177.8 cm) Weight: 97.1 kg (214 lb 1.1 oz) IBW/kg (Calculated) : 73  Vital Signs: Temp: 98.2 F (36.8 C) (12/28 0554) BP: 107/45 (12/28 0554) Pulse Rate: 62 (12/28 0554)  Labs: Recent Labs    03/27/20 1016 03/28/20 0644 03/29/20 0453  HGB 9.5*  --   --   HCT 31.6*  --   --   PLT 208  --   --   LABPROT 33.7* 33.1* 26.6*  INR 3.5* 3.4* 2.6*  CREATININE 0.83  --   --     Estimated Creatinine Clearance: 87.1 mL/min (by C-G formula based on SCr of 0.83 mg/dL).   Medical History: Past Medical History:  Diagnosis Date  . AAA (abdominal aortic aneurysm) (HCC)    Followed by Dr. 03/31/20  . Arthritis   . CAD (coronary artery disease)    a. CABG 2000.  [facility])    Prostate:  Radiation Tx  . Carotid artery disease (HCC)   . Chest pain    precordial. mild chronic .[facility]... nonischemic  . Chronic edema   . Coronary artery disease    a. Nuclear, January, 2008, no ischemia b. Cath 08/2012- 1/4 patent grafts, RCA CTO, no flow-limiting disease, medically managed  . Diabetes mellitus without complication (HCC)   . Dizziness 02/2011  . Dyslipidemia   . GERD (gastroesophageal reflux disease)    TAKES TUMS & ROLAIDS AS NEEDED  . Hx of CABG 2000  . Hypertension   . Myocardial infarction (HCC) 1999  . Neck pain 02/2011  . Pneumonia   . Pulmonary hypertension (HCC)   . Renal artery stenosis (HCC)    50-70%  . S/P femoropopliteal bypass surgery    Dr. 03/2011  . Sinus bradycardia    Asymptomatic    Medications:  Medications Prior to Admission  Medication Sig Dispense Refill Last Dose  . amLODipine (NORVASC) 10 MG tablet Take 10 mg by mouth every evening.   03/25/2020 at  Unknown time  . aspirin EC 81 MG EC tablet Take 1 tablet (81 mg total) by mouth daily. (Patient taking differently: Take 81 mg by mouth every evening.) 30 tablet 3 03/25/2020 at 2200  . atorvastatin (LIPITOR) 40 MG tablet Take 1 tablet (40 mg total) by mouth daily. (Patient taking differently: Take 40 mg by mouth every evening.) 90 tablet 1 03/25/2020 at Unknown time  . HYDROcodone-acetaminophen (NORCO/VICODIN) 5-325 MG tablet Take 1 tablet by mouth daily as needed for moderate pain.   03/25/2020 at Unknown time  . ipratropium (ATROVENT) 0.03 % nasal spray Place 2 sprays into both nostrils 2 (two) times daily.   03/25/2020 at Unknown time  . isosorbide mononitrate (IMDUR) 60 MG 24 hr tablet Take 60 mg by mouth every evening.    03/25/2020 at Unknown time  . losartan (COZAAR) 100 MG tablet Take 100 mg by mouth daily.   03/25/2020 at Unknown time  . metFORMIN (GLUCOPHAGE) 1000 MG tablet Take 1 tablet (1,000 mg total) by mouth 2 (two) times daily with a meal. (Patient taking differently: Take 1,000 mg by mouth 2 (two) times daily with a meal. Pt states he will take twice daily if he remembers. Most of the time  he takes 1 tab pm.)   03/25/2020 at Unknown time  . metoprolol succinate (TOPROL XL) 25 MG 24 hr tablet Take 1 tablet (25 mg total) by mouth daily. 90 tablet 3 03/25/2020 at Unknown time  . nitroGLYCERIN (NITROSTAT) 0.4 MG SL tablet Place 1 tablet (0.4 mg total) under the tongue every 5 (five) minutes x 3 doses as needed for chest pain. 25 tablet 3 03/26/2020 at Unknown time  . pantoprazole (PROTONIX) 40 MG tablet TAKE 1 TABLET EVERY DAY (Patient taking differently: Take 40 mg by mouth every evening.) 90 tablet 3 03/25/2020 at Unknown time  . potassium chloride SA (KLOR-CON) 20 MEQ tablet Take on days you need to take demadex (Patient taking differently: Take 20 mEq by mouth daily as needed (weight gain over 3 lbs in 24 hours w/torsemide).) 60 tablet 0   . torsemide (DEMADEX) 20 MG tablet Take 20 mg  for weight gain over 3 lbs in 24 hours (Patient taking differently: Take 20 mg by mouth daily as needed (weight gain over 3 lbs in 24 hours).) 60 tablet 0   . warfarin (COUMADIN) 2 MG tablet Take 2 mg by mouth every evening. MANAGED BY PMD   03/25/2020 at Unknown time  . polyethylene glycol-electrolytes (NULYTELY) 420 g solution Take 4,000 mLs by mouth once.  (Patient not taking: Reported on 03/26/2020)   Completed Course at Unknown time  . tamsulosin (FLOMAX) 0.4 MG CAPS capsule Take 0.4 mg by mouth daily as needed (bladder issues.).  (Patient not taking: Reported on 03/26/2020)   Not Taking at Unknown time  . tetrahydrozoline-zinc (VISINE-AC) 0.05-0.25 % ophthalmic solution 2 drops 3 (three) times daily as needed. (Patient not taking: Reported on 03/26/2020)   Not Taking at Unknown time    Assessment: Pharmacy consulted to dose warfarin in patient with atrial fibrillation.  Patient's INR on admission is 3.0 with last dose given 12/24 .  Home dose listed as 2 mg daily.  INR therapeutic at 2.6, since it has been elevated and on zmax that can interact and increase INR's, will give only 1mg  today  Goal of Therapy:  INR 2-3 Monitor platelets by anticoagulation protocol: Yes   Plan:  Warfarin 1mg  po today Monitor daily INR and s/s of bleeding.  Isac Sarna, BS Pharm D, [place] Clinical Pharmacist Pager [phone] 03/29/2020 10:45 AM

## 2020-03-29 NOTE — TOC Transition Note (Signed)
Transition of Care [facility]) - CM/SW Discharge Note   Patient Details  Name: Troy [name] MRN: [id number] Date of Birth: November 12, 1940  [facility] CM/SW Contact:  Leitha Bleak, RN Phone Number: 03/29/2020, 11:55 AM   Clinical Narrative:   Patient discharging home today. [employer] updated, they are sending a driver to deliver home oxygen. MD and RN updated.    Final next level of care: Home/Self Care Barriers to Discharge: Barriers Resolved   Patient Goals and CMS Choice Patient states their goals for this hospitalization and ongoing recovery are:: to go home. CMS Medicare.gov Compare Post Acute Care list provided to:: Patient Choice offered to / list presented to : Patient  Discharge Placement        Patient and family notified of of transfer: 03/29/20  Discharge Plan and Services     Post Acute Care Choice: Durable Medical Equipment          DME Arranged: Dan Humphreys rolling,3-N-1,Oxygen DME Agency: [employer] Date DME Agency Contacted: 03/28/20 Time DME Agency Contacted: 1520 Representative spoke with at DME Agency: [employer]

## 2020-03-29 NOTE — Care Management Important Message (Signed)
Important Message  Patient Details  Name: Troy [name] MRN: [id number] Date of Birth: September 27, 1940   Medicare Important Message Given:  Yes     Corey Harold 03/29/2020, 11:46 AM

## 2020-03-31 LAB — CULTURE, BLOOD (ROUTINE X 2)
Culture: NO GROWTH
Culture: NO GROWTH
Special Requests: ADEQUATE
Special Requests: ADEQUATE

## 2020-04-01 DIAGNOSIS — I129 Hypertensive chronic kidney disease with stage 1 through stage 4 chronic kidney disease, or unspecified chronic kidney disease: Secondary | ICD-10-CM | POA: Diagnosis not present

## 2020-04-01 DIAGNOSIS — N182 Chronic kidney disease, stage 2 (mild): Secondary | ICD-10-CM | POA: Diagnosis not present

## 2020-04-01 DIAGNOSIS — I25708 Atherosclerosis of coronary artery bypass graft(s), unspecified, with other forms of angina pectoris: Secondary | ICD-10-CM | POA: Diagnosis not present

## 2020-04-01 DIAGNOSIS — E1122 Type 2 diabetes mellitus with diabetic chronic kidney disease: Secondary | ICD-10-CM | POA: Diagnosis not present

## 2020-04-04 ENCOUNTER — Other Ambulatory Visit (HOSPITAL_COMMUNITY): Payer: Self-pay

## 2020-04-04 ENCOUNTER — Other Ambulatory Visit: Payer: Self-pay

## 2020-04-04 ENCOUNTER — Encounter (HOSPITAL_COMMUNITY): Payer: Self-pay | Admitting: Cardiology

## 2020-04-04 ENCOUNTER — Ambulatory Visit (HOSPITAL_COMMUNITY)
Admission: RE | Admit: 2020-04-04 | Discharge: 2020-04-04 | Disposition: A | Discharge status: Home / Self Care | Payer: Medicare HMO | Source: Ambulatory Visit | Attending: Cardiology | Admitting: Cardiology

## 2020-04-04 VITALS — BP 130/70 | Ht 70.0 in | Wt 211.8 lb

## 2020-04-04 DIAGNOSIS — I110 Hypertensive heart disease with heart failure: Secondary | ICD-10-CM | POA: Diagnosis not present

## 2020-04-04 DIAGNOSIS — J9611 Chronic respiratory failure with hypoxia: Secondary | ICD-10-CM | POA: Diagnosis not present

## 2020-04-04 DIAGNOSIS — Z951 Presence of aortocoronary bypass graft: Secondary | ICD-10-CM | POA: Diagnosis not present

## 2020-04-04 DIAGNOSIS — I5032 Chronic diastolic (congestive) heart failure: Secondary | ICD-10-CM

## 2020-04-04 DIAGNOSIS — I4821 Permanent atrial fibrillation: Secondary | ICD-10-CM

## 2020-04-04 DIAGNOSIS — Z8249 Family history of ischemic heart disease and other diseases of the circulatory system: Secondary | ICD-10-CM | POA: Insufficient documentation

## 2020-04-04 DIAGNOSIS — I5022 Chronic systolic (congestive) heart failure: Secondary | ICD-10-CM

## 2020-04-04 DIAGNOSIS — R0602 Shortness of breath: Secondary | ICD-10-CM | POA: Diagnosis present

## 2020-04-04 DIAGNOSIS — I272 Pulmonary hypertension, unspecified: Secondary | ICD-10-CM

## 2020-04-04 DIAGNOSIS — Z7984 Long term (current) use of oral hypoglycemic drugs: Secondary | ICD-10-CM | POA: Diagnosis not present

## 2020-04-04 DIAGNOSIS — Z7982 Long term (current) use of aspirin: Secondary | ICD-10-CM | POA: Insufficient documentation

## 2020-04-04 DIAGNOSIS — Z7901 Long term (current) use of anticoagulants: Secondary | ICD-10-CM | POA: Diagnosis not present

## 2020-04-04 DIAGNOSIS — J984 Other disorders of lung: Secondary | ICD-10-CM | POA: Diagnosis not present

## 2020-04-04 DIAGNOSIS — R0789 Other chest pain: Secondary | ICD-10-CM | POA: Insufficient documentation

## 2020-04-04 DIAGNOSIS — E785 Hyperlipidemia, unspecified: Secondary | ICD-10-CM

## 2020-04-04 DIAGNOSIS — I2510 Atherosclerotic heart disease of native coronary artery without angina pectoris: Secondary | ICD-10-CM | POA: Insufficient documentation

## 2020-04-04 HISTORY — DX: Heart failure, unspecified: I50.9

## 2020-04-04 LAB — LIPID PANEL
Cholesterol: 84 mg/dL (ref 0–200)
HDL: 23 mg/dL — ABNORMAL LOW (ref >40)
LDL Cholesterol: 47 mg/dL (ref 0–99)
Total CHOL/HDL Ratio: 3.7 RATIO
Triglycerides: 69 mg/dL (ref <150)
VLDL: 14 mg/dL (ref 0–40)

## 2020-04-04 LAB — BASIC METABOLIC PANEL
Anion gap: 11 (ref 5–15)
BUN: 18 mg/dL (ref 8–23)
CO2: 23 mmol/L (ref 22–32)
Calcium: 9.1 mg/dL (ref 8.9–10.3)
Chloride: 106 mmol/L (ref 98–111)
Creatinine, Ser: 0.9 mg/dL (ref 0.61–1.24)
GFR, Estimated: >60 mL/min (ref >60)
Glucose, Bld: 103 mg/dL — ABNORMAL HIGH (ref 70–99)
Potassium: 4.1 mmol/L (ref 3.5–5.1)
Sodium: 140 mmol/L (ref 135–145)

## 2020-04-04 LAB — CBC
HCT: 29.6 % — ABNORMAL LOW (ref 39.0–52.0)
Hemoglobin: 8.8 g/dL — ABNORMAL LOW (ref 13.0–17.0)
MCH: 25.4 pg — ABNORMAL LOW (ref 26.0–34.0)
MCHC: 29.7 g/dL — ABNORMAL LOW (ref 30.0–36.0)
MCV: 85.3 fL (ref 80.0–100.0)
Platelets: 263 10*3/uL (ref 150–400)
RBC: 3.47 MIL/uL — ABNORMAL LOW (ref 4.22–5.81)
RDW: 18.6 % — ABNORMAL HIGH (ref 11.5–15.5)
WBC: 6.9 10*3/uL (ref 4.0–10.5)
nRBC: 0 % (ref 0.0–0.2)

## 2020-04-04 MED ORDER — POTASSIUM CHLORIDE CRYS ER 10 MEQ PO TBCR: 10.0000 meq | EXTENDED_RELEASE_TABLET | Freq: Every day | ORAL | 3 refills | Status: DC

## 2020-04-04 MED ORDER — TORSEMIDE 10 MG PO TABS: 10.0000 mg | ORAL_TABLET | Freq: Every day | ORAL | 3 refills | Status: DC

## 2020-04-04 MED ORDER — EMPAGLIFLOZIN 10 MG PO TABS: 10.0000 mg | ORAL_TABLET | Freq: Every day | ORAL | 3 refills | Status: DC

## 2020-04-04 NOTE — Progress Notes (Signed)
PCP: Assunta Found, MD HF Cardiology: [name]  80 y.o. with history of permanent atrial fibrillation, CAD s/p CABG 2000, PAD, chronic diastolic CHF was referred to CHF clinic by Ronie Spies, PA, for evaluation of CHF and pulmonary hypertension.  Patient had CABG in 2000, last cath was in 2014 showing occluded SVG-OM, occluded SVG-PDA, occluded RCA, and atretic LIMA.  SVG-D was patent and native LAD was patent.  Patient had bilateral fem-pop bypasses, peripheral dopplers in 7/21 showed that the right fem-pop was occluded.  He is in permanent atrial fibrillation on warfarin. Echo in 10/21 showed normal LV systolic function with moderately dilated/mildly dysfunctional RV and PASP 75 mmHg.  In 12/21, he was admitted with PNA and treated with antibiotics.  CTA chest showed no PE, RML PNA, and bilateral hilar + mediastinal lymphadenopathy.  He was sent home from this admission on home oxygen.   Patient has been short of breath walking 10-15 feet since he left the hospital in 12/21.  About a month ago, he was short of breath with long distances but did fine around the house. Now much more symptomatic and newly on home oxygen.  He is a remote smoker (>20 years ago).  He has had constant mild central chest tightness, says this has been present unchanged since CABG. No lightheadedness.  He rarely takes torsemide.  He has chronic lower extremity edema. He has never had a sleep study.  No palpitations or syncope.  No claudication.  He lives with his wife who is chronically ill.   Labs (12/21): K 3.8, creatinine 0.83, hgb 9.5  ECG (personally reviewed, 12/21): Atrial fibrillation, RBBB  PMH: 1. Atrial fibrillation: Permanent.  2. CAD: CABG 2000.  - LHC (2014) with totally occluded RCA, totally occluded SVG-RCA, totally occluded SVG-OM, atretic LIMA, patent SVG-D.   3. HTN 4. PAD: s/p bilateral fem-pop bypasses.   - Peripheral arterial dopplers (7/21): Occluded right fem-pop, patent left fem-pop.  5. Type 2  diabetes 6. Prostate cancer: Treated with radiation. 7. PVCs 8. History of renal artery stenosis.  9. Right vocal cord paralysis.  10. Chronic diastolic CHF: Echo (10/21) with EF 60-65%, mildly decreased RV systolic function with moderate RV enlargement, severe biatrial enlargement, PASP 75 mmHg, dilated IVC.  11. 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   SH: Lives in [place] with wife.  Retired.  Remote smoker (>20 years ago). No ETOH.   Family History  Problem Relation Age of Onset  . Deep vein thrombosis Father   . Lung cancer Sister   . Diabetes Sister   . Heart disease Sister        After age 75  . Hyperlipidemia Sister   . Hypertension Sister   . Lung cancer Sister   . Breast cancer Sister   . Hypertension Mother   . Diabetes Sister   . Coronary artery disease Other        family hx of  . Cancer Brother        "crab cancer"  . Heart disease Brother   . Heart attack Brother   . Heart attack Daughter   . Colon cancer Neg Hx   . Stroke Neg Hx    ROS: All systems reviewed and negative except as per HPI.   Current Outpatient Medications  Medication Sig Dispense Refill  . acetaminophen (TYLENOL) 325 MG tablet Take 2 tablets (650 mg total) by mouth every 6 (six) hours as needed for mild pain, fever or headache (or Fever >/= 101).  12 tablet 0  . albuterol (VENTOLIN HFA) 108 (90 Base) MCG/ACT inhaler Inhale 2 puffs into the lungs every 6 (six) hours as needed for wheezing or shortness of breath. 1 each 2  . amLODipine (NORVASC) 5 MG tablet Take 1 tablet (5 mg total) by mouth daily. For BP 30 tablet 4  . aspirin 81 MG EC tablet Take 1 tablet (81 mg total) by mouth daily with breakfast. 30 tablet 3  . atorvastatin (LIPITOR) 40 MG tablet Take 1 tablet (40 mg total) by mouth daily. 90 tablet 1  . empagliflozin (JARDIANCE) 10 MG TABS tablet Take 1 tablet (10 mg total) by mouth daily before breakfast. 30 tablet 3  . guaiFENesin (MUCINEX) 600 MG 12 hr tablet Take 1 tablet (600 mg total) by  mouth 2 (two) times daily. 20 tablet 0  . HYDROcodone-acetaminophen (NORCO/VICODIN) 5-325 MG tablet Take 1 tablet by mouth daily as needed for moderate pain.    [name] ipratropium (ATROVENT) 0.03 % nasal spray Place 2 sprays into both nostrils 2 (two) times daily.    [name] ipratropium-albuterol (DUONEB) 0.5-2.5 (3) MG/3ML SOLN Take 3 mLs by nebulization every 4 (four) hours as needed. 360 mL 1  . isosorbide mononitrate (IMDUR) 60 MG 24 hr tablet Take 60 mg by mouth every evening.     [name] losartan (COZAAR) 25 MG tablet Take 1 tablet (25 mg total) by mouth daily. 30 tablet 4  . metFORMIN (GLUCOPHAGE) 1000 MG tablet Take 1 tablet (1,000 mg total) by mouth 2 (two) times daily with a meal.    . metoprolol succinate (TOPROL XL) 25 MG 24 hr tablet Take 1 tablet (25 mg total) by mouth daily. 90 tablet 3  . nitroGLYCERIN (NITROSTAT) 0.4 MG SL tablet Place 1 tablet (0.4 mg total) under the tongue every 5 (five) minutes x 3 doses as needed for chest pain. 25 tablet 3  . OXYGEN Inhale into the lungs. 3 liters n/c    . pantoprazole (PROTONIX) 40 MG tablet TAKE 1 TABLET EVERY DAY 90 tablet 3  . warfarin (COUMADIN) 2 MG tablet Take 2 mg by mouth every evening. MANAGED BY PMD    . potassium chloride (KLOR-CON) 10 MEQ tablet Take 1 tablet (10 mEq total) by mouth daily. Only take while taking Torsemide 30 tablet 3  . torsemide (DEMADEX) 10 MG tablet Take 1 tablet (10 mg total) by mouth daily with breakfast. Take 20 mg for weight gain over 3 lbs in 24 hours 30 tablet 3   No current facility-administered medications for this encounter.   BP 130/70   Ht 5\' 10"  (1.778 m)   Wt 96.1 kg (211 lb 12.8 oz)   BMI 30.39 kg/m  General: NAD Neck: JVP 10 cm, no thyromegaly or thyroid nodule.  Lungs: Clear to auscultation bilaterally with normal respiratory effort. CV: Nondisplaced PMI.  Heart irregular S1/S2, no S3/S4, 1/6 SEM RUSB.  1+ edema to knees bilaterally.  No carotid bruit.  Difficult to palpate pedal pulses.  Abdomen: Soft,  nontender, no hepatosplenomegaly, no distention.  Skin: Intact without lesions or rashes.  Neurologic: Alert and oriented x 3.  Psych: Normal affect. Extremities: No clubbing or cyanosis.  HEENT: Normal.   Assessment/Plan: 1. Chronic diastolic CHF: Echo in [id number] with EF 60-65%, mildly decreased RV systolic function with moderate RV enlargement, severe biatrial enlargement, PASP 75 mmHg, dilated IVC. On exam, he is volume overloaded with NYHA class IIIb symptoms.   - Start torsemide 10 mg daily (starting low dose, he is very  concerned about his renal function => apparently developed AKI when taking torsemide in the past). BMET today and in 1 week.  - Start KCl 10 daily.  - Start Jardiance 10 mg daily given HFpEF.  - I am going to do right and left heart catheterization to assess filling pressures, PA pressure (severely elevated on echo), and will assess coronaries given prominent exertional symptoms and history of CAD. Will hold warfarin for cath and cover for Lovenox bridge, arrange with coumadin clinic.  Discussed risks/benefits with patient and will proceed with study.  2. 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  ?Secondary PH due to LV diastolic dysfunction versus group 3 PH due to lung disease.  Less likely group 1 PH.  No PE on recent CTA.  - Will arrange for PFTs.  - RHC as above.  - If he has PAH, will need V/Q scan.  3. Chronic hypoxemic respiratory failure: Remote smoker.  Hilar and mediastinal lymphadenopathy on recent CT, no reported ILD. Now on home oxygen.  - Will repeat noncontrast chest CT in 3 months to followup on hilar and mediastinal lymphadenopathy.  - Refer to pulmonary, can see Dr. Lamonte Sakai in [place].  4. CAD: s/p CABG in 2000.  Cath in 2014 with 3/4 occluded grafts but patent LIMA.   - As above, given exertional symptoms will repeat coronary angiography.  - Continue ASA 81 and atorvastatin. Check lipids today.  5. Atrial fibrillation: Permanent.  - On warfarin.  -  Continue Toprol XL 25 daily.  6. PAD: Occluded right fem-pop bypass.  No claudication.   Followup in 2 wks post-cath.   Loralie Champagne 04/04/2020

## 2020-04-04 NOTE — H&P (View-Only) (Signed)
PCP: Assunta Found, MD HF Cardiology: [name]  80 y.o. with history of permanent atrial fibrillation, CAD s/p CABG 2000, PAD, chronic diastolic CHF was referred to CHF clinic by Ronie Spies, PA, for evaluation of CHF and pulmonary hypertension.  Patient had CABG in 2000, last cath was in 2014 showing occluded SVG-OM, occluded SVG-PDA, occluded RCA, and atretic LIMA.  SVG-D was patent and native LAD was patent.  Patient had bilateral fem-pop bypasses, peripheral dopplers in 7/21 showed that the right fem-pop was occluded.  He is in permanent atrial fibrillation on warfarin. Echo in 10/21 showed normal LV systolic function with moderately dilated/mildly dysfunctional RV and PASP 75 mmHg.  In 12/21, he was admitted with PNA and treated with antibiotics.  CTA chest showed no PE, RML PNA, and bilateral hilar + mediastinal lymphadenopathy.  He was sent home from this admission on home oxygen.   Patient has been short of breath walking 10-15 feet since he left the hospital in 12/21.  About a month ago, he was short of breath with long distances but did fine around the house. Now much more symptomatic and newly on home oxygen.  He is a remote smoker (>20 years ago).  He has had constant mild central chest tightness, says this has been present unchanged since CABG. No lightheadedness.  He rarely takes torsemide.  He has chronic lower extremity edema. He has never had a sleep study.  No palpitations or syncope.  No claudication.  He lives with his wife who is chronically ill.   Labs (12/21): K 3.8, creatinine 0.83, hgb 9.5  ECG (personally reviewed, 12/21): Atrial fibrillation, RBBB  PMH: 1. Atrial fibrillation: Permanent.  2. CAD: CABG 2000.  - LHC (2014) with totally occluded RCA, totally occluded SVG-RCA, totally occluded SVG-OM, atretic LIMA, patent SVG-D.   3. HTN 4. PAD: s/p bilateral fem-pop bypasses.   - Peripheral arterial dopplers (7/21): Occluded right fem-pop, patent left fem-pop.  5. Type 2  diabetes 6. Prostate cancer: Treated with radiation. 7. PVCs 8. History of renal artery stenosis.  9. Right vocal cord paralysis.  10. Chronic diastolic CHF: Echo (10/21) with EF 60-65%, mildly decreased RV systolic function with moderate RV enlargement, severe biatrial enlargement, PASP 75 mmHg, dilated IVC.  11. 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   SH: Lives in [place] with wife.  Retired.  Remote smoker (>20 years ago). No ETOH.   Family History  Problem Relation Age of Onset  . Deep vein thrombosis Father   . Lung cancer Sister   . Diabetes Sister   . Heart disease Sister        After age 75  . Hyperlipidemia Sister   . Hypertension Sister   . Lung cancer Sister   . Breast cancer Sister   . Hypertension Mother   . Diabetes Sister   . Coronary artery disease Other        family hx of  . Cancer Brother        "crab cancer"  . Heart disease Brother   . Heart attack Brother   . Heart attack Daughter   . Colon cancer Neg Hx   . Stroke Neg Hx    ROS: All systems reviewed and negative except as per HPI.   Current Outpatient Medications  Medication Sig Dispense Refill  . acetaminophen (TYLENOL) 325 MG tablet Take 2 tablets (650 mg total) by mouth every 6 (six) hours as needed for mild pain, fever or headache (or Fever >/= 101).  12 tablet 0  . albuterol (VENTOLIN HFA) 108 (90 Base) MCG/ACT inhaler Inhale 2 puffs into the lungs every 6 (six) hours as needed for wheezing or shortness of breath. 1 each 2  . amLODipine (NORVASC) 5 MG tablet Take 1 tablet (5 mg total) by mouth daily. For BP 30 tablet 4  . aspirin 81 MG EC tablet Take 1 tablet (81 mg total) by mouth daily with breakfast. 30 tablet 3  . atorvastatin (LIPITOR) 40 MG tablet Take 1 tablet (40 mg total) by mouth daily. 90 tablet 1  . empagliflozin (JARDIANCE) 10 MG TABS tablet Take 1 tablet (10 mg total) by mouth daily before breakfast. 30 tablet 3  . guaiFENesin (MUCINEX) 600 MG 12 hr tablet Take 1 tablet (600 mg total) by  mouth 2 (two) times daily. 20 tablet 0  . HYDROcodone-acetaminophen (NORCO/VICODIN) 5-325 MG tablet Take 1 tablet by mouth daily as needed for moderate pain.    [name] ipratropium (ATROVENT) 0.03 % nasal spray Place 2 sprays into both nostrils 2 (two) times daily.    [name] ipratropium-albuterol (DUONEB) 0.5-2.5 (3) MG/3ML SOLN Take 3 mLs by nebulization every 4 (four) hours as needed. 360 mL 1  . isosorbide mononitrate (IMDUR) 60 MG 24 hr tablet Take 60 mg by mouth every evening.     [name] losartan (COZAAR) 25 MG tablet Take 1 tablet (25 mg total) by mouth daily. 30 tablet 4  . metFORMIN (GLUCOPHAGE) 1000 MG tablet Take 1 tablet (1,000 mg total) by mouth 2 (two) times daily with a meal.    . metoprolol succinate (TOPROL XL) 25 MG 24 hr tablet Take 1 tablet (25 mg total) by mouth daily. 90 tablet 3  . nitroGLYCERIN (NITROSTAT) 0.4 MG SL tablet Place 1 tablet (0.4 mg total) under the tongue every 5 (five) minutes x 3 doses as needed for chest pain. 25 tablet 3  . OXYGEN Inhale into the lungs. 3 liters n/c    . pantoprazole (PROTONIX) 40 MG tablet TAKE 1 TABLET EVERY DAY 90 tablet 3  . warfarin (COUMADIN) 2 MG tablet Take 2 mg by mouth every evening. MANAGED BY PMD    . potassium chloride (KLOR-CON) 10 MEQ tablet Take 1 tablet (10 mEq total) by mouth daily. Only take while taking Torsemide 30 tablet 3  . torsemide (DEMADEX) 10 MG tablet Take 1 tablet (10 mg total) by mouth daily with breakfast. Take 20 mg for weight gain over 3 lbs in 24 hours 30 tablet 3   No current facility-administered medications for this encounter.   BP 130/70   Ht 5\' 10"  (1.778 m)   Wt 96.1 kg (211 lb 12.8 oz)   BMI 30.39 kg/m  General: NAD Neck: JVP 10 cm, no thyromegaly or thyroid nodule.  Lungs: Clear to auscultation bilaterally with normal respiratory effort. CV: Nondisplaced PMI.  Heart irregular S1/S2, no S3/S4, 1/6 SEM RUSB.  1+ edema to knees bilaterally.  No carotid bruit.  Difficult to palpate pedal pulses.  Abdomen: Soft,  nontender, no hepatosplenomegaly, no distention.  Skin: Intact without lesions or rashes.  Neurologic: Alert and oriented x 3.  Psych: Normal affect. Extremities: No clubbing or cyanosis.  HEENT: Normal.   Assessment/Plan: 1. Chronic diastolic CHF: Echo in [id number] with EF 60-65%, mildly decreased RV systolic function with moderate RV enlargement, severe biatrial enlargement, PASP 75 mmHg, dilated IVC. On exam, he is volume overloaded with NYHA class IIIb symptoms.   - Start torsemide 10 mg daily (starting low dose, he is very  concerned about his renal function => apparently developed AKI when taking torsemide in the past). BMET today and in 1 week.  - Start KCl 10 daily.  - Start Jardiance 10 mg daily given HFpEF.  - I am going to do right and left heart catheterization to assess filling pressures, PA pressure (severely elevated on echo), and will assess coronaries given prominent exertional symptoms and history of CAD. Will hold warfarin for cath and cover for Lovenox bridge, arrange with coumadin clinic.  Discussed risks/benefits with patient and will proceed with study.  2. 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  ?Secondary PH due to LV diastolic dysfunction versus group 3 PH due to lung disease.  Less likely group 1 PH.  No PE on recent CTA.  - Will arrange for PFTs.  - RHC as above.  - If he has PAH, will need V/Q scan.  3. Chronic hypoxemic respiratory failure: Remote smoker.  Hilar and mediastinal lymphadenopathy on recent CT, no reported ILD. Now on home oxygen.  - Will repeat noncontrast chest CT in 3 months to followup on hilar and mediastinal lymphadenopathy.  - Refer to pulmonary, can see Dr. Lamonte Sakai in [place].  4. CAD: s/p CABG in 2000.  Cath in 2014 with 3/4 occluded grafts but patent LIMA.   - As above, given exertional symptoms will repeat coronary angiography.  - Continue ASA 81 and atorvastatin. Check lipids today.  5. Atrial fibrillation: Permanent.  - On warfarin.  -  Continue Toprol XL 25 daily.  6. PAD: Occluded right fem-pop bypass.  No claudication.   Followup in 2 wks post-cath.   Loralie Champagne 04/04/2020

## 2020-04-04 NOTE — Patient Instructions (Addendum)
START Jardiance 10mg  (1 tablet) daily  Labs done today, your results will be available in MyChart, we will contact you for abnormal readings.  Take Torsemide 10mg  (1 tablet) daily  INCREASE KCL (1 tablet) daily  Your physician has recommended that you have a pulmonary function test. Pulmonary Function Tests are a group of tests that measure how well air moves in and out of your lungs. ONCE WE APPROVE WITH INSURANCE WE WILL CONTACT YOU TO SCHEDULE  Your physician has recommended that you have a sleep study. This test records several body functions during sleep, including: brain activity, eye movement, oxygen and carbon dioxide blood levels, heart rate and rhythm, breathing rate and rhythm, the flow of air through your mouth and nose, snoring, body muscle movements, and chest and belly movement. ONCE WE APPROVE WITH INSURANCE WE WILL CONTACT YOU  You have been referred to Pulmonary. They will contact you to schedule.  Your physician recommends that you schedule a follow-up appointment in: 3 weeks  If you have any questions or concerns before your next appointment please send a message through [place] or call our office at [phone].    TO LEAVE A MESSAGE FOR THE NURSE SELECT OPTION 2, PLEASE LEAVE A MESSAGE INCLUDING: . YOUR NAME . DATE OF BIRTH . CALL BACK NUMBER . REASON FOR CALL**this is important as we prioritize the call backs  YOU WILL RECEIVE A CALL BACK THE SAME DAY AS LONG AS YOU CALL BEFORE 4:00 PM     Heart Catheterization Instructions   You are scheduled for a Cardiac Catheterization on Monday, January 10 with Dr. Friday.  1. Please arrive at the [facility] Entr[facility] at [facility]: [address] [place], [address] [place] at 5:30 AM (This time is two hours before your procedure to ensure your preparation). Free valet parking service is available.   Special note: Every effort is made to have your procedure done on time. Please  understand that emergencies sometimes delay scheduled procedures.  2. Diet: Do not eat solid foods after midnight.  The patient may have clear liquids until 5am upon the day of the procedure.  3. COVID TEST: [place] 1/7 1:45pm 4. Medication instructions in preparation for your procedure:  DO NOT TAKE TORSEMIDE 1/10 AM  DO NOT TAKE METFORMIN 1/9-1/12  COUMADIN CLINIC WILL CONTACT YOU ABOUT LOVENOX BRIDGE   On the morning of your procedure, take your Aspirin and any morning medicines NOT listed above.  You may use sips of water.  5. Plan for one night stay--bring personal belongings. 6. Bring a current list of your medications and current insurance cards. 7. You MUST have a responsible person to drive you home. 8. Someone MUST be with you the first 24 hours after you arrive home or your discharge will be delayed. 9. Please wear clothes that are easy to get on and off and wear slip-on shoes.  Thank you for allowing 3/7 to care for you!   -- [facility]

## 2020-04-04 NOTE — Patient Outreach (Signed)
[facility]) Care Management  04/04/2020  Troy [name] 09/30/1940 [id number]    EMMI-General Discharge RED ON EMMI ALERT Day # 1 Date: 04/01/2020 Red Alert Reason: "Know who to call about changes in condition? No"   Outreach attempt #1 to patient. Spoke with patient who denies any acute issues or concerns at present. He states that he has been getting along fairly well. His daughter has been very supportive and staying with him and assisting him. Reviewed and addressed red alert. Patient voices error in response. He did not hear question clearly. He confirms he has MD contact inf and know how to get in contact with them. He is scheduled for cardiac follow up appt this afternoon. He voices that he was getting ready to call office to see about rescheduling due to inclement weather. He confirms he has all his meds in the home and Troy complete antibiotic today. He denies any RN CM needs or concerns at this time. Advised patient that they would get one more automated EMMI-GENERAL post discharge calls to assess how they are doing following recent hospitalization and Troy receive a call from a nurse if any of their responses were abnormal. Patient voiced understanding and was appreciative of f/u call.     Plan: RN CM Troy close case at this time.  Antionette Fairy, RN,BSN,CCM [facility] Care Management Telephonic Care Management Coordinator Direct Phone: [phone] Toll Free: [phone] Fax: [phone]

## 2020-04-07 ENCOUNTER — Telehealth (HOSPITAL_COMMUNITY): Payer: Self-pay

## 2020-04-07 DIAGNOSIS — I5022 Chronic systolic (congestive) heart failure: Secondary | ICD-10-CM

## 2020-04-07 NOTE — Telephone Encounter (Signed)
Samara Snide, RN  04/07/2020 4:27 PM EST Back to Top     Patient denies blood in the stools. Lab orders placed.   Samara Snide, RN  04/06/2020 1:04 PM EST      Tried calling patient, no answer, unable to leave message. Will try again later

## 2020-04-07 NOTE — Telephone Encounter (Signed)
-----   Message from Laurey Morale, MD sent at 04/04/2020  5:26 PM EST ----- Hgb is low post-recent hospitalization.  Has he noted blood or melena in stool? Needs to have Fe, TIBC, ferritin drawn.

## 2020-04-07 NOTE — Telephone Encounter (Signed)
Procedure: Bilateral Heart Catheterization Date of service: 04/11/20 No precert required.

## 2020-04-08 ENCOUNTER — Other Ambulatory Visit (HOSPITAL_COMMUNITY)
Admission: RE | Admit: 2020-04-08 | Discharge: 2020-04-08 | Disposition: A | Discharge status: Home / Self Care | Payer: Medicare HMO | Source: Ambulatory Visit | Attending: Cardiology | Admitting: Cardiology

## 2020-04-08 ENCOUNTER — Other Ambulatory Visit: Payer: Self-pay

## 2020-04-08 ENCOUNTER — Telehealth (HOSPITAL_COMMUNITY): Payer: Self-pay | Admitting: *Deleted

## 2020-04-08 ENCOUNTER — Telehealth (HOSPITAL_COMMUNITY): Payer: Self-pay | Admitting: Surgery

## 2020-04-08 DIAGNOSIS — I5022 Chronic systolic (congestive) heart failure: Secondary | ICD-10-CM | POA: Insufficient documentation

## 2020-04-08 DIAGNOSIS — J189 Pneumonia, unspecified organism: Secondary | ICD-10-CM | POA: Diagnosis not present

## 2020-04-08 DIAGNOSIS — Z6830 Body mass index (BMI) 30.0-30.9, adult: Secondary | ICD-10-CM | POA: Diagnosis not present

## 2020-04-08 DIAGNOSIS — I272 Pulmonary hypertension, unspecified: Secondary | ICD-10-CM

## 2020-04-08 DIAGNOSIS — Z20822 Contact with and (suspected) exposure to covid-19: Secondary | ICD-10-CM | POA: Insufficient documentation

## 2020-04-08 DIAGNOSIS — I4891 Unspecified atrial fibrillation: Secondary | ICD-10-CM | POA: Diagnosis not present

## 2020-04-08 DIAGNOSIS — I25708 Atherosclerosis of coronary artery bypass graft(s), unspecified, with other forms of angina pectoris: Secondary | ICD-10-CM | POA: Diagnosis not present

## 2020-04-08 DIAGNOSIS — Z0000 Encounter for general adult medical examination without abnormal findings: Secondary | ICD-10-CM | POA: Diagnosis not present

## 2020-04-08 DIAGNOSIS — Z7901 Long term (current) use of anticoagulants: Secondary | ICD-10-CM | POA: Diagnosis not present

## 2020-04-08 DIAGNOSIS — Z1331 Encounter for screening for depression: Secondary | ICD-10-CM | POA: Diagnosis not present

## 2020-04-08 LAB — CBC
HCT: 34 % — ABNORMAL LOW (ref 39.0–52.0)
Hemoglobin: 10 g/dL — ABNORMAL LOW (ref 13.0–17.0)
MCH: 25.1 pg — ABNORMAL LOW (ref 26.0–34.0)
MCHC: 29.4 g/dL — ABNORMAL LOW (ref 30.0–36.0)
MCV: 85.2 fL (ref 80.0–100.0)
Platelets: 361 10*3/uL (ref 150–400)
RBC: 3.99 MIL/uL — ABNORMAL LOW (ref 4.22–5.81)
RDW: 18.4 % — ABNORMAL HIGH (ref 11.5–15.5)
WBC: 10.7 10*3/uL — ABNORMAL HIGH (ref 4.0–10.5)
nRBC: 0 % (ref 0.0–0.2)

## 2020-04-08 LAB — BASIC METABOLIC PANEL
Anion gap: 12 (ref 5–15)
BUN: 37 mg/dL — ABNORMAL HIGH (ref 8–23)
CO2: 28 mmol/L (ref 22–32)
Calcium: 9.2 mg/dL (ref 8.9–10.3)
Chloride: 97 mmol/L — ABNORMAL LOW (ref 98–111)
Creatinine, Ser: 1.88 mg/dL — ABNORMAL HIGH (ref 0.61–1.24)
GFR, Estimated: 36 mL/min — ABNORMAL LOW (ref >60)
Glucose, Bld: 129 mg/dL — ABNORMAL HIGH (ref 70–99)
Potassium: 4.1 mmol/L (ref 3.5–5.1)
Sodium: 137 mmol/L (ref 135–145)

## 2020-04-08 LAB — IRON AND TIBC
Iron: 31 ug/dL — ABNORMAL LOW (ref 45–182)
Saturation Ratios: 6 % — ABNORMAL LOW (ref 17.9–39.5)
TIBC: 494 ug/dL — ABNORMAL HIGH (ref 250–450)
UIBC: 463 ug/dL

## 2020-04-08 LAB — IRON: Iron: 31 ug/dL — ABNORMAL LOW (ref 45–182)

## 2020-04-08 LAB — FERRITIN: Ferritin: 40 ng/mL (ref 24–336)

## 2020-04-08 NOTE — Telephone Encounter (Signed)
Pt's coumadin is managed by his PCP, per Dr Aundra Dubin pt needs Lovenox bridge for heart cath on 1/10  Called and spoke w/pt's PCP, pt is there now for a f/u appt, they will check INR and start Lovenox bridge, she will fax me INR results as well

## 2020-04-08 NOTE — Addendum Note (Signed)
Addended by: Scarlette Calico on: 04/08/2020 09:58 AM   Modules accepted: Orders

## 2020-04-08 NOTE — Telephone Encounter (Signed)
Patient called and said that he had received a voicemail from [facility] concerning his Coumadin and he was returning our call.  Kevan Rosebush had spoken with his PCP (while he was at his appt there) and they were taking care of Lovenox bridge prior to scheduled cath on Monday with Dr. Aundra Dubin.  Patient states that he went to [facility] this afternoon and Lovenox was not yet ready--he was instructed to come back later after they contacted PCP office.  He will return to get Lovenox medication tonite.

## 2020-04-09 LAB — SARS CORONAVIRUS 2 (TAT 6-24 HRS): SARS Coronavirus 2: NEGATIVE

## 2020-04-11 ENCOUNTER — Encounter (HOSPITAL_COMMUNITY): Payer: Self-pay | Admitting: Cardiology

## 2020-04-11 ENCOUNTER — Ambulatory Visit (HOSPITAL_COMMUNITY)
Admission: RE | Admit: 2020-04-11 | Discharge: 2020-04-11 | Disposition: A | Discharge status: Home / Self Care | Payer: Medicare HMO | Attending: Cardiology | Admitting: Cardiology

## 2020-04-11 ENCOUNTER — Other Ambulatory Visit (HOSPITAL_COMMUNITY): Payer: Self-pay | Admitting: *Deleted

## 2020-04-11 ENCOUNTER — Encounter (HOSPITAL_COMMUNITY)
Admission: RE | Disposition: A | Discharge status: Home / Self Care | Payer: Self-pay | Source: Home / Self Care | Attending: Cardiology

## 2020-04-11 ENCOUNTER — Telehealth (HOSPITAL_COMMUNITY): Payer: Self-pay | Admitting: *Deleted

## 2020-04-11 DIAGNOSIS — J9611 Chronic respiratory failure with hypoxia: Secondary | ICD-10-CM | POA: Diagnosis not present

## 2020-04-11 DIAGNOSIS — I739 Peripheral vascular disease, unspecified: Secondary | ICD-10-CM | POA: Insufficient documentation

## 2020-04-11 DIAGNOSIS — I2510 Atherosclerotic heart disease of native coronary artery without angina pectoris: Secondary | ICD-10-CM | POA: Insufficient documentation

## 2020-04-11 DIAGNOSIS — Z7984 Long term (current) use of oral hypoglycemic drugs: Secondary | ICD-10-CM | POA: Diagnosis not present

## 2020-04-11 DIAGNOSIS — I5032 Chronic diastolic (congestive) heart failure: Secondary | ICD-10-CM | POA: Diagnosis not present

## 2020-04-11 DIAGNOSIS — I272 Pulmonary hypertension, unspecified: Secondary | ICD-10-CM

## 2020-04-11 DIAGNOSIS — Z7982 Long term (current) use of aspirin: Secondary | ICD-10-CM | POA: Diagnosis not present

## 2020-04-11 DIAGNOSIS — Z79899 Other long term (current) drug therapy: Secondary | ICD-10-CM | POA: Diagnosis not present

## 2020-04-11 DIAGNOSIS — I110 Hypertensive heart disease with heart failure: Secondary | ICD-10-CM | POA: Diagnosis not present

## 2020-04-11 DIAGNOSIS — Z7901 Long term (current) use of anticoagulants: Secondary | ICD-10-CM | POA: Diagnosis not present

## 2020-04-11 DIAGNOSIS — Z87891 Personal history of nicotine dependence: Secondary | ICD-10-CM | POA: Diagnosis not present

## 2020-04-11 DIAGNOSIS — I4821 Permanent atrial fibrillation: Secondary | ICD-10-CM | POA: Insufficient documentation

## 2020-04-11 DIAGNOSIS — I5022 Chronic systolic (congestive) heart failure: Secondary | ICD-10-CM

## 2020-04-11 HISTORY — PX: RIGHT HEART CATH AND CORONARY/GRAFT ANGIOGRAPHY: CATH118265

## 2020-04-11 LAB — PROTIME-INR
INR: 1.4 — ABNORMAL HIGH (ref 0.8–1.2)
Prothrombin Time: 16.4 seconds — ABNORMAL HIGH (ref 11.4–15.2)

## 2020-04-11 LAB — BASIC METABOLIC PANEL
Anion gap: 11 (ref 5–15)
BUN: 22 mg/dL (ref 8–23)
CO2: 26 mmol/L (ref 22–32)
Calcium: 8.9 mg/dL (ref 8.9–10.3)
Chloride: 101 mmol/L (ref 98–111)
Creatinine, Ser: 1.22 mg/dL (ref 0.61–1.24)
GFR, Estimated: >60 mL/min (ref >60)
Glucose, Bld: 130 mg/dL — ABNORMAL HIGH (ref 70–99)
Potassium: 3.8 mmol/L (ref 3.5–5.1)
Sodium: 138 mmol/L (ref 135–145)

## 2020-04-11 LAB — POCT I-STAT EG7
Acid-Base Excess: 3 mmol/L — ABNORMAL HIGH (ref 0.0–2.0)
Acid-Base Excess: 5 mmol/L — ABNORMAL HIGH (ref 0.0–2.0)
Bicarbonate: 27.4 mmol/L (ref 20.0–28.0)
Bicarbonate: 29 mmol/L — ABNORMAL HIGH (ref 20.0–28.0)
Calcium, Ion: 1.1 mmol/L — ABNORMAL LOW (ref 1.15–1.40)
Calcium, Ion: 1.16 mmol/L (ref 1.15–1.40)
HCT: 29 % — ABNORMAL LOW (ref 39.0–52.0)
HCT: 30 % — ABNORMAL LOW (ref 39.0–52.0)
Hemoglobin: 10.2 g/dL — ABNORMAL LOW (ref 13.0–17.0)
Hemoglobin: 9.9 g/dL — ABNORMAL LOW (ref 13.0–17.0)
O2 Saturation: 61 %
O2 Saturation: 63 %
Potassium: 3.7 mmol/L (ref 3.5–5.1)
Potassium: 3.8 mmol/L (ref 3.5–5.1)
Sodium: 141 mmol/L (ref 135–145)
Sodium: 142 mmol/L (ref 135–145)
TCO2: 29 mmol/L (ref 22–32)
TCO2: 30 mmol/L (ref 22–32)
pCO2, Ven: 38.7 mmHg — ABNORMAL LOW (ref 44.0–60.0)
pCO2, Ven: 41 mmHg — ABNORMAL LOW (ref 44.0–60.0)
pH, Ven: 7.458 — ABNORMAL HIGH (ref 7.250–7.430)
pH, Ven: 7.458 — ABNORMAL HIGH (ref 7.250–7.430)
pO2, Ven: 30 mmHg — CL (ref 32.0–45.0)
pO2, Ven: 31 mmHg — CL (ref 32.0–45.0)

## 2020-04-11 LAB — CBC
HCT: 33 % — ABNORMAL LOW (ref 39.0–52.0)
Hemoglobin: 9.5 g/dL — ABNORMAL LOW (ref 13.0–17.0)
MCH: 24.4 pg — ABNORMAL LOW (ref 26.0–34.0)
MCHC: 28.8 g/dL — ABNORMAL LOW (ref 30.0–36.0)
MCV: 84.8 fL (ref 80.0–100.0)
Platelets: 316 10*3/uL (ref 150–400)
RBC: 3.89 MIL/uL — ABNORMAL LOW (ref 4.22–5.81)
RDW: 18 % — ABNORMAL HIGH (ref 11.5–15.5)
WBC: 7.1 10*3/uL (ref 4.0–10.5)
nRBC: 0 % (ref 0.0–0.2)

## 2020-04-11 LAB — GLUCOSE, CAPILLARY: Glucose-Capillary: 117 mg/dL — ABNORMAL HIGH (ref 70–99)

## 2020-04-11 SURGERY — RIGHT HEART CATH AND CORONARY/GRAFT ANGIOGRAPHY
Anesthesia: LOCAL

## 2020-04-11 MED ORDER — IOHEXOL 350 MG/ML SOLN
INTRAVENOUS | Status: AC
  Filled 2020-04-11: qty 1

## 2020-04-11 MED ORDER — SODIUM CHLORIDE 0.9% FLUSH: 3.0000 mL | Freq: Two times a day (BID) | INTRAVENOUS | Status: DC

## 2020-04-11 MED ORDER — HEPARIN (PORCINE) IN NACL 1000-0.9 UT/500ML-% IV SOLN
INTRAVENOUS | Status: AC
  Filled 2020-04-11: qty 1000

## 2020-04-11 MED ORDER — SODIUM CHLORIDE 0.9% FLUSH: 3.0000 mL | INTRAVENOUS | Status: DC | PRN

## 2020-04-11 MED ORDER — FENTANYL CITRATE (PF) 100 MCG/2ML IJ SOLN
INTRAMUSCULAR | Status: DC | PRN
  Administered 2020-04-11 (×2): 25 ug via INTRAVENOUS

## 2020-04-11 MED ORDER — MIDAZOLAM HCL 2 MG/2ML IJ SOLN
INTRAMUSCULAR | Status: DC | PRN
  Administered 2020-04-11: 1 mg via INTRAVENOUS

## 2020-04-11 MED ORDER — FENTANYL CITRATE (PF) 100 MCG/2ML IJ SOLN
INTRAMUSCULAR | Status: AC
  Filled 2020-04-11: qty 2

## 2020-04-11 MED ORDER — HEPARIN (PORCINE) IN NACL 1000-0.9 UT/500ML-% IV SOLN
INTRAVENOUS | Status: DC | PRN
  Administered 2020-04-11 (×2): 500 mL

## 2020-04-11 MED ORDER — SODIUM CHLORIDE 0.9 % IV SOLN: 250.0000 mL | INTRAVENOUS | Status: DC | PRN

## 2020-04-11 MED ORDER — ONDANSETRON HCL 4 MG/2ML IJ SOLN: 4.0000 mg | Freq: Four times a day (QID) | INTRAMUSCULAR | Status: DC | PRN

## 2020-04-11 MED ORDER — HEPARIN SODIUM (PORCINE) 1000 UNIT/ML IJ SOLN
INTRAMUSCULAR | Status: DC | PRN
  Administered 2020-04-11: 4500 [IU] via INTRAVENOUS

## 2020-04-11 MED ORDER — HYDRALAZINE HCL 20 MG/ML IJ SOLN: 10.0000 mg | INTRAMUSCULAR | Status: DC | PRN

## 2020-04-11 MED ORDER — ACETAMINOPHEN 325 MG PO TABS: 650.0000 mg | ORAL_TABLET | ORAL | Status: DC | PRN

## 2020-04-11 MED ORDER — LIDOCAINE HCL (PF) 1 % IJ SOLN
INTRAMUSCULAR | Status: AC
  Filled 2020-04-11: qty 30

## 2020-04-11 MED ORDER — SODIUM CHLORIDE 0.9 % IV SOLN: INTRAVENOUS | Status: DC

## 2020-04-11 MED ORDER — MIDAZOLAM HCL 2 MG/2ML IJ SOLN
INTRAMUSCULAR | Status: AC
  Filled 2020-04-11: qty 2

## 2020-04-11 MED ORDER — LABETALOL HCL 5 MG/ML IV SOLN: 10.0000 mg | INTRAVENOUS | Status: DC | PRN

## 2020-04-11 MED ORDER — IOHEXOL 350 MG/ML SOLN
INTRAVENOUS | Status: DC | PRN
  Administered 2020-04-11: 135 mL

## 2020-04-11 MED ORDER — VERAPAMIL HCL 2.5 MG/ML IV SOLN
INTRAVENOUS | Status: DC | PRN
  Administered 2020-04-11 (×2): 10 mL via INTRA_ARTERIAL

## 2020-04-11 MED ORDER — VERAPAMIL HCL 2.5 MG/ML IV SOLN
INTRAVENOUS | Status: AC
  Filled 2020-04-11: qty 2

## 2020-04-11 MED ORDER — TORSEMIDE 10 MG PO TABS: 10.0000 mg | ORAL_TABLET | ORAL | 3 refills | Status: DC

## 2020-04-11 MED ORDER — ASPIRIN 81 MG PO CHEW: 81.0000 mg | CHEWABLE_TABLET | ORAL | Status: DC

## 2020-04-11 MED ORDER — ISOSORBIDE MONONITRATE ER 60 MG PO TB24: 120.0000 mg | ORAL_TABLET | Freq: Every evening | ORAL | 6 refills | Status: DC

## 2020-04-11 MED ORDER — EMPAGLIFLOZIN 10 MG PO TABS: 10.0000 mg | ORAL_TABLET | Freq: Every day | ORAL | 3 refills | Status: DC

## 2020-04-11 MED ORDER — LIDOCAINE HCL (PF) 1 % IJ SOLN
INTRAMUSCULAR | Status: DC | PRN
  Administered 2020-04-11 (×2): 2 mL

## 2020-04-11 MED ORDER — HEPARIN SODIUM (PORCINE) 1000 UNIT/ML IJ SOLN
INTRAMUSCULAR | Status: AC
  Filled 2020-04-11: qty 1

## 2020-04-11 SURGICAL SUPPLY — 12 items
CATH BALLN WEDGE 5F 110CM (CATHETERS) ×2 IMPLANT
CATH INFINITI 5FR JL5 (CATHETERS) ×2 IMPLANT
CATH INFINITI 5FR MULTPACK ANG (CATHETERS) ×2 IMPLANT
DEVICE RAD COMP TR BAND LRG (VASCULAR PRODUCTS) ×2 IMPLANT
GLIDESHEATH SLEND SS 6F .021 (SHEATH) ×2 IMPLANT
GUIDEWIRE INQWIRE 1.5J.035X260 (WIRE) ×2 IMPLANT
INQWIRE 1.5J .035X260CM (WIRE) ×4
KIT HEART LEFT (KITS) ×2 IMPLANT
PACK CARDIAC CATHETERIZATION (CUSTOM PROCEDURE TRAY) ×2 IMPLANT
SHEATH GLIDE SLENDER 4/5FR (SHEATH) ×2 IMPLANT
TRANSDUCER W/STOPCOCK (MISCELLANEOUS) ×2 IMPLANT
WIRE HI TORQ VERSACORE-J 145CM (WIRE) ×2 IMPLANT

## 2020-04-11 NOTE — Telephone Encounter (Signed)
-----   Message from Larey Dresser, MD sent at 04/11/2020  9:26 AM EST ----- 1. Increase Imdur to 120 mg daily.  2. Take Jardiance in the evening.  3. Change torsemide to 10 mg every other day, start back on Wednesday.   4. Needs BMET in 1 week.  5. Make sure he has followup with me and appt with Dr. Lamonte Sakai (pulmonology) in [place].   Please call daughter to give instructions.

## 2020-04-11 NOTE — Discharge Instructions (Signed)
1. Increase isosorbide mononitrate to 120 mg daily.  2. Take Jardiance in the evening.  3. Restart torsemide 10 mg but take it only every other day.  Do not restart until Wednesday 1/12.  4. Restart Lovenox and warfarin tonight around 8 pm.  Will need instruction from coumadin clinic for how long to continue Lovenox.  5. Keep follow-up appointment with Dr Aundra Dubin on Jan 25th at 10:20 AM 6. You have been referred to [facility] in [place], they will call you for an appointment   [place].  Radial Site Care  This sheet gives you information about how to care for yourself after your procedure. Your health care provider may also give you more specific instructions. If you have problems or questions, contact your health care provider. What can I expect after the procedure? After the procedure, it is common to have:  Bruising and tenderness at the catheter insertion area. Follow these instructions at home: Medicines  Take over-the-counter and prescription medicines only as told by your health care provider. Insertion site care 1. Follow instructions from your health care provider about how to take care of your insertion site. Make sure you: ? Wash your hands with soap and water before you change your bandage (dressing). If soap and water are not available, use hand sanitizer. ? Change your dressing as told by your health care provider. 2. Check your insertion site every day for signs of infection. Check for: ? Redness, swelling, or pain. ? Fluid or blood. ? Pus or a bad smell. ? Warmth. 3. Do not take baths, swim, or use a hot tub for 5 days. 4. You may shower 24-48 hours after the procedure. ? Remove the dressing and gently wash the site with plain soap and water. ? Pat the area dry with a clean towel. ? Do not rub the site. That could cause bleeding. 5. Do not apply powder or lotion to the site. Activity  1. For 24 hours after  the procedure, or as directed by your health care provider: ? Do not flex or bend the affected arm. ? Do not push or pull heavy objects with the affected arm. ? Do not drive yourself home from the hospital or clinic. You may drive 24 hours after the procedure. ? Do not operate machinery or power tools. ? KEEP ARM ELEVATED THE REMAINDER OF THE DAY. 2. Do not push, pull or lift anything that is heavier than 10 lb for 5 days. 3. Ask your health care provider when it is okay to: ? Return to work or school. ? Resume usual physical activities or sports. ? Resume sexual activity. General instructions  If the catheter site starts to bleed, raise your arm and put firm pressure on the site. If the bleeding does not stop, get help right away. This is a medical emergency.  DRINK PLENTY OF FLUIDS FOR THE NEXT 2-3 DAYS.  If you went home on the same day as your procedure, a responsible adult should be with you for the first 24 hours after you arrive home.  Keep all follow-up visits as told by your health care provider. This is important. Contact a health care provider if:  You have a fever.  You have redness, swelling, or yellow drainage around your insertion site. Get help right away if:  You have unusual pain at the radial site.  The catheter insertion area swells very fast.  The insertion area is bleeding, and  the bleeding does not stop when you hold steady pressure on the area.  Your arm or hand becomes pale, cool, tingly, or numb. These symptoms may represent a serious problem that is an emergency. Do not wait to see if the symptoms will go away. Get medical help right away. Call your local emergency services (911 in the [place] Do not drive yourself to the hospital. Summary  After the procedure, it is common to have bruising and tenderness at the site.  Follow instructions from your health care provider about how to take care of your radial site wound. Check the wound every day for  signs of infection.  Do not push, pull or lift anything that is heavier than 10 lb for 5 days.  This information is not intended to replace advice given to you by your health care provider. Make sure you discuss any questions you have with your health care provider. Document Revised: 04/24/2017 Document Reviewed: 04/24/2017 Elsevier Patient Education  2020 [employer].

## 2020-04-11 NOTE — Telephone Encounter (Addendum)
Pt's lab work and cath report faxed to PCP at [phone], along with order for bmet in 1 week

## 2020-04-11 NOTE — Interval H&P Note (Signed)
History and Physical Interval Note:  04/11/2020 7:58 AM  Troy [name]  has presented today for surgery, with the diagnosis of heart failure.  The various methods of treatment have been discussed with the patient and family. After consideration of risks, benefits and other options for treatment, the patient has consented to  Procedure(s): RIGHT/LEFT HEART CATH AND CORONARY/GRAFT ANGIOGRAPHY (N/A) as a surgical intervention.  The patient's history has been reviewed, patient examined, no change in status, stable for surgery.  I have reviewed the patient's chart and labs.  Questions were answered to the patient's satisfaction.     Tracen Mahler [employer]

## 2020-04-11 NOTE — Telephone Encounter (Signed)
Called pt's daughter and reviewed all instructions with her, she request info be put on his d/c paperwork, advised this had been done. Ref placed for pulm, order for bmet in 1 week faxed to PCP

## 2020-04-11 NOTE — Progress Notes (Signed)
Last Lovenox injection taken 04/10/20 @ 1900.

## 2020-04-11 NOTE — Progress Notes (Signed)
ANTICOAGULATION CONSULT NOTE - Initial Consult  Pharmacy Consult for warfarin Indication: atrial fibrillation  Allergies  Allergen Reactions  . Niacin Itching and Rash    Burning sensation  . Iodine-131 Nausea And Vomiting  . Nsaids Other (See Comments)    Taking Coumadin    Patient Measurements: Height: 5\' 10"  (177.8 cm) Weight: 93 kg (205 lb) IBW/kg (Calculated) : 73   Vital Signs: Temp: 97.4 F (36.3 C) (01/10 0546) Temp Source: Oral (01/10 0546) BP: 116/52 (01/10 1100) Pulse Rate: 55 (01/10 1100)  Labs: Recent Labs    04/08/20 1437 04/11/20 0557  HGB 10.0* 9.5*  HCT 34.0* 33.0*  PLT 361 316  LABPROT  --  16.4*  INR  --  1.4*  CREATININE 1.88* 1.22    Estimated Creatinine Clearance: 58.1 mL/min (by C-G formula based on SCr of 1.22 mg/dL).   Medical History: Past Medical History:  Diagnosis Date  . AAA (abdominal aortic aneurysm) ([place])    Followed by Dr. Curt Jews  . Arthritis   . CAD (coronary artery disease)    a. CABG 2000.  [name] Cancer [facility])    Prostate:  Radiation Tx  . Carotid artery disease ([place])   . Chest pain    precordial. mild chronic .[name]... nonischemic  . CHF (congestive heart failure) ([place])   . Chronic edema   . Coronary artery disease    a. Nuclear, January, 2008, no ischemia b. Cath 08/2012- 1/4 patent grafts, RCA CTO, no flow-limiting disease, medically managed  . Diabetes mellitus without complication ([place])   . Dizziness 02/2011  . Dyslipidemia   . GERD (gastroesophageal reflux disease)    TAKES TUMS & ROLAIDS AS NEEDED  . Hx of CABG 2000  . Hypertension   . Myocardial infarction ([place]) 1999  . Neck pain 02/2011  . Pneumonia   . Pulmonary hypertension ([place])   . Renal artery stenosis ([facility])    50-70%  . S/P femoropopliteal bypass surgery    Dr. Donnetta Hutching  . Sinus bradycardia    Asymptomatic     Assessment: 24 yoM on warfarin PTA for hx AFib admitted for elective cath. Pt to be discharged on home regimen of 2mg  daily  today.  Goal of Therapy:  INR 2-3 Monitor platelets by anticoagulation protocol: Yes   Plan:  -Discharging on 2mg  daily -Recommend INR check Fri or Mon  Arrie Senate, PharmD, BCPS, [facility] Clinical Pharmacist [phone] Please check AMION for all [place] numbers 04/11/2020

## 2020-04-12 DIAGNOSIS — R633 Feeding difficulties, unspecified: Secondary | ICD-10-CM | POA: Diagnosis not present

## 2020-04-12 DIAGNOSIS — R1310 Dysphagia, unspecified: Secondary | ICD-10-CM | POA: Diagnosis not present

## 2020-04-12 DIAGNOSIS — R1313 Dysphagia, pharyngeal phase: Secondary | ICD-10-CM | POA: Diagnosis not present

## 2020-04-13 DIAGNOSIS — Z7901 Long term (current) use of anticoagulants: Secondary | ICD-10-CM | POA: Diagnosis not present

## 2020-04-14 ENCOUNTER — Other Ambulatory Visit (HOSPITAL_COMMUNITY): Payer: Self-pay

## 2020-04-14 DIAGNOSIS — E611 Iron deficiency: Secondary | ICD-10-CM

## 2020-04-15 DIAGNOSIS — Z7901 Long term (current) use of anticoagulants: Secondary | ICD-10-CM | POA: Diagnosis not present

## 2020-04-18 NOTE — Discharge Instructions (Signed)
Ferumoxytol injection What is this medicine? FERUMOXYTOL is an iron complex. Iron is used to make healthy red blood cells, which carry oxygen and nutrients throughout the body. This medicine is used to treat iron deficiency anemia. This medicine may be used for other purposes; ask your health care provider or pharmacist if you have questions. COMMON BRAND NAME(S): Feraheme What should I tell my health care provider before I take this medicine? They need to know if you have any of these conditions:  anemia not caused by low iron levels  high levels of iron in the blood  magnetic resonance imaging (MRI) test scheduled  an unusual or allergic reaction to iron, other medicines, foods, dyes, or preservatives  pregnant or trying to get pregnant  breast-feeding How should I use this medicine? This medicine is for injection into a vein. It is given by a health care professional in a hospital or clinic setting. Talk to your pediatrician regarding the use of this medicine in children. Special care may be needed. Overdosage: If you think you have taken too much of this medicine contact a poison control center or emergency room at once. NOTE: This medicine is only for you. Do not share this medicine with others. What if I miss a dose? It is important not to miss your dose. Call your doctor or health care professional if you are unable to keep an appointment. What may interact with this medicine? This medicine may interact with the following medications:  other iron products This list may not describe all possible interactions. Give your health care provider a list of all the medicines, herbs, non-prescription drugs, or dietary supplements you use. Also tell them if you smoke, drink alcohol, or use illegal drugs. Some items may interact with your medicine. What should I watch for while using this medicine? Visit your doctor or healthcare professional regularly. Tell your doctor or healthcare  professional if your symptoms do not start to get better or if they get worse. You may need blood work done while you are taking this medicine. You may need to follow a special diet. Talk to your doctor. Foods that contain iron include: whole grains/cereals, dried fruits, beans, or peas, leafy green vegetables, and organ meats (liver, kidney). What side effects may I notice from receiving this medicine? Side effects that you should report to your doctor or health care professional as soon as possible:  allergic reactions like skin rash, itching or hives, swelling of the face, lips, or tongue  breathing problems  changes in blood pressure  feeling faint or lightheaded, falls  fever or chills  flushing, sweating, or hot feelings  swelling of the ankles or feet Side effects that usually do not require medical attention (report to your doctor or health care professional if they continue or are bothersome):  diarrhea  headache  nausea, vomiting  stomach pain This list may not describe all possible side effects. Call your doctor for medical advice about side effects. You may report side effects to FDA at 1-800-FDA-1088. Where should I keep my medicine? This drug is given in a hospital or clinic and will not be stored at home. NOTE: This sheet is a summary. It may not cover all possible information. If you have questions about this medicine, talk to your doctor, pharmacist, or health care provider.  2021 Elsevier/Gold Standard (2016-05-07 20:21:10)  

## 2020-04-19 ENCOUNTER — Other Ambulatory Visit: Payer: Self-pay

## 2020-04-19 ENCOUNTER — Encounter (HOSPITAL_COMMUNITY)
Admission: RE | Admit: 2020-04-19 | Discharge: 2020-04-19 | Disposition: A | Discharge status: Home / Self Care | Payer: Medicare HMO | Source: Ambulatory Visit | Attending: Cardiology | Admitting: Cardiology

## 2020-04-19 DIAGNOSIS — E611 Iron deficiency: Secondary | ICD-10-CM | POA: Diagnosis not present

## 2020-04-19 MED ORDER — SODIUM CHLORIDE 0.9 % IV SOLN
510.0000 mg | INTRAVENOUS | Status: DC
  Administered 2020-04-19: 510 mg via INTRAVENOUS
  Filled 2020-04-19: qty 510

## 2020-04-21 DIAGNOSIS — Z7901 Long term (current) use of anticoagulants: Secondary | ICD-10-CM | POA: Diagnosis not present

## 2020-04-26 ENCOUNTER — Other Ambulatory Visit: Payer: Self-pay

## 2020-04-26 ENCOUNTER — Encounter (HOSPITAL_COMMUNITY): Payer: Self-pay | Admitting: Cardiology

## 2020-04-26 ENCOUNTER — Encounter (HOSPITAL_COMMUNITY)
Admission: RE | Admit: 2020-04-26 | Discharge: 2020-04-26 | Disposition: A | Discharge status: Home / Self Care | Payer: Medicare HMO | Source: Ambulatory Visit | Attending: Cardiology | Admitting: Cardiology

## 2020-04-26 ENCOUNTER — Ambulatory Visit (HOSPITAL_COMMUNITY)
Admission: RE | Admit: 2020-04-26 | Discharge: 2020-04-26 | Disposition: A | Discharge status: Home / Self Care | Payer: Medicare HMO | Source: Ambulatory Visit | Attending: Cardiology | Admitting: Cardiology

## 2020-04-26 VITALS — BP 110/52 | HR 57 | Wt 200.4 lb

## 2020-04-26 DIAGNOSIS — Z8249 Family history of ischemic heart disease and other diseases of the circulatory system: Secondary | ICD-10-CM | POA: Insufficient documentation

## 2020-04-26 DIAGNOSIS — Z7982 Long term (current) use of aspirin: Secondary | ICD-10-CM | POA: Diagnosis not present

## 2020-04-26 DIAGNOSIS — I4821 Permanent atrial fibrillation: Secondary | ICD-10-CM | POA: Insufficient documentation

## 2020-04-26 DIAGNOSIS — Z87891 Personal history of nicotine dependence: Secondary | ICD-10-CM | POA: Diagnosis not present

## 2020-04-26 DIAGNOSIS — I509 Heart failure, unspecified: Secondary | ICD-10-CM | POA: Diagnosis not present

## 2020-04-26 DIAGNOSIS — I272 Pulmonary hypertension, unspecified: Secondary | ICD-10-CM | POA: Diagnosis not present

## 2020-04-26 DIAGNOSIS — I70402 Unspecified atherosclerosis of autologous vein bypass graft(s) of the extremities, left leg: Secondary | ICD-10-CM | POA: Insufficient documentation

## 2020-04-26 DIAGNOSIS — I5032 Chronic diastolic (congestive) heart failure: Secondary | ICD-10-CM

## 2020-04-26 DIAGNOSIS — Z7984 Long term (current) use of oral hypoglycemic drugs: Secondary | ICD-10-CM | POA: Diagnosis not present

## 2020-04-26 DIAGNOSIS — I25810 Atherosclerosis of coronary artery bypass graft(s) without angina pectoris: Secondary | ICD-10-CM | POA: Insufficient documentation

## 2020-04-26 DIAGNOSIS — Z79899 Other long term (current) drug therapy: Secondary | ICD-10-CM | POA: Diagnosis not present

## 2020-04-26 DIAGNOSIS — J9611 Chronic respiratory failure with hypoxia: Secondary | ICD-10-CM | POA: Insufficient documentation

## 2020-04-26 DIAGNOSIS — R590 Localized enlarged lymph nodes: Secondary | ICD-10-CM | POA: Diagnosis not present

## 2020-04-26 DIAGNOSIS — I25119 Atherosclerotic heart disease of native coronary artery with unspecified angina pectoris: Secondary | ICD-10-CM | POA: Diagnosis not present

## 2020-04-26 DIAGNOSIS — E611 Iron deficiency: Secondary | ICD-10-CM | POA: Diagnosis not present

## 2020-04-26 DIAGNOSIS — R0789 Other chest pain: Secondary | ICD-10-CM | POA: Diagnosis not present

## 2020-04-26 DIAGNOSIS — I110 Hypertensive heart disease with heart failure: Secondary | ICD-10-CM | POA: Diagnosis not present

## 2020-04-26 DIAGNOSIS — J449 Chronic obstructive pulmonary disease, unspecified: Secondary | ICD-10-CM | POA: Diagnosis not present

## 2020-04-26 DIAGNOSIS — Z7901 Long term (current) use of anticoagulants: Secondary | ICD-10-CM | POA: Diagnosis not present

## 2020-04-26 LAB — BASIC METABOLIC PANEL
Anion gap: 13 (ref 5–15)
BUN: 22 mg/dL (ref 8–23)
CO2: 24 mmol/L (ref 22–32)
Calcium: 9 mg/dL (ref 8.9–10.3)
Chloride: 103 mmol/L (ref 98–111)
Creatinine, Ser: 1.13 mg/dL (ref 0.61–1.24)
GFR, Estimated: >60 mL/min (ref >60)
Glucose, Bld: 109 mg/dL — ABNORMAL HIGH (ref 70–99)
Potassium: 3.5 mmol/L (ref 3.5–5.1)
Sodium: 140 mmol/L (ref 135–145)

## 2020-04-26 LAB — LIPID PANEL
Cholesterol: 93 mg/dL (ref 0–200)
HDL: 22 mg/dL — ABNORMAL LOW (ref >40)
LDL Cholesterol: 49 mg/dL (ref 0–99)
Total CHOL/HDL Ratio: 4.2 RATIO
Triglycerides: 110 mg/dL (ref <150)
VLDL: 22 mg/dL (ref 0–40)

## 2020-04-26 LAB — BRAIN NATRIURETIC PEPTIDE: B Natriuretic Peptide: 314.6 pg/mL — ABNORMAL HIGH (ref 0.0–100.0)

## 2020-04-26 MED ORDER — SODIUM CHLORIDE 0.9 % IV SOLN
510.0000 mg | INTRAVENOUS | Status: AC
  Administered 2020-04-26: 510 mg via INTRAVENOUS
  Filled 2020-04-26: qty 510

## 2020-04-26 NOTE — Progress Notes (Signed)
PCP: Sharilyn Sites, MD HF Cardiology: Dr. Aundra Dubin  80 y.o. with history of permanent atrial fibrillation, CAD s/p CABG 2000, PAD, chronic diastolic CHF was referred to CHF clinic by Melina Copa, PA, for evaluation of CHF and pulmonary hypertension.  Patient had CABG in 2000, last cath was in 2014 showing occluded SVG-OM, occluded SVG-PDA, occluded RCA, and atretic LIMA.  SVG-D was patent and native LAD was patent.  Patient had bilateral fem-pop bypasses, peripheral dopplers in 7/21 showed that the right fem-pop was occluded.  He is in permanent atrial fibrillation on warfarin. Echo in 10/21 showed normal LV systolic function with moderately dilated/mildly dysfunctional RV and PASP 75 mmHg.  In 12/21, he was admitted with PNA and treated with antibiotics.  CTA chest showed no PE, RML PNA, and bilateral hilar + mediastinal lymphadenopathy.  He was sent home from this admission on home oxygen.   In 1/22, RHC/LHC was done.  This showed patent LIMA-LAD and SVG-D, occluded SVG-OM and occluded SVG-RCA, 95% calcified proximal RCA stenosis, totally occluded PLV with collaterals (known from prior).  There was moderate pulmonary hypertension with optimized left and right heart filling pressures.  I reviewed the cath with interventional cardiology, would require atherectomy to treat the RCA.  We decided to manage medically initially.   Patient returns for followup of CHF.  Weight is down 11 lbs since last appointment on standing diuretic and Jardiance.  Breathing has improved.  He is dyspneic after walking a long distance. No orthopnea/PND.  Not using oxygen.  Rare atypical chest tightness.  He is not getting much exercise.  He does take care of his wife who is chronically ill.  He remains in chronic atrial fibrillation.   Labs (12/21): K 3.8, creatinine 0.83, hgb 9.5 Labs (1/22): K 3.8, creatinine 1.22  ECG (personally reviewed): Atrial fibrillation, iRBBB  PMH: 1. Atrial fibrillation: Permanent.  2. CAD: CABG  2000.  - LHC (2014) with totally occluded RCA, totally occluded SVG-RCA, totally occluded SVG-OM, atretic LIMA, patent SVG-D.   - LCH (1/22): patent LIMA-LAD and SVG-D, occluded SVG-OM and occluded SVG-RCA, 60-70% calcified ostial RCA stenosis and 95% calcified proximal RCA stenosis, totally occluded PLV with collaterals (known from prior). 3. HTN 4. PAD: s/p bilateral fem-pop bypasses.   - Peripheral arterial dopplers (7/21): Occluded right fem-pop, patent left fem-pop.  5. Type 2 diabetes 6. Prostate cancer: Treated with radiation. 7. PVCs 8. History of renal artery stenosis.  9. Right vocal cord paralysis.  10. Chronic diastolic CHF: Echo (33/29) with EF 60-65%, mildly decreased RV systolic function with moderate RV enlargement, severe biatrial enlargement, PASP 75 mmHg, dilated IVC.  - RHC (1/22): mean RA 6, PA 65/18 mean 35, mean PCWP 14, CI 2.71, PVR 3.7 WU.  11. 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   SH: Lives in [place] with wife.  Retired.  Remote smoker (>20 years ago). No ETOH.   Family History  Problem Relation Age of Onset  . Deep vein thrombosis Father   . Lung cancer Sister   . Diabetes Sister   . Heart disease Sister        After age 36  . Hyperlipidemia Sister   . Hypertension Sister   . Lung cancer Sister   . Breast cancer Sister   . Hypertension Mother   . Diabetes Sister   . Coronary artery disease Other        family hx of  . Cancer Brother        "crab cancer"  .  Heart disease Brother   . Heart attack Brother   . Heart attack Daughter   . Colon cancer Neg Hx   . Stroke Neg Hx    ROS: All systems reviewed and negative except as per HPI.   Current Outpatient Medications  Medication Sig Dispense Refill  . acetaminophen (TYLENOL) 325 MG tablet Take 2 tablets (650 mg total) by mouth every 6 (six) hours as needed for mild pain, fever or headache (or Fever >/= 101). 12 tablet 0  . albuterol (VENTOLIN HFA) 108 (90 Base) MCG/ACT inhaler Inhale 2 puffs into the  lungs every 6 (six) hours as needed for wheezing or shortness of breath. 1 each 2  . amLODipine (NORVASC) 5 MG tablet Take 1 tablet (5 mg total) by mouth daily. For BP 30 tablet 4  . aspirin 81 MG EC tablet Take 1 tablet (81 mg total) by mouth daily with breakfast. 30 tablet 3  . atorvastatin (LIPITOR) 40 MG tablet Take 1 tablet (40 mg total) by mouth daily. 90 tablet 1  . empagliflozin (JARDIANCE) 10 MG TABS tablet Take 1 tablet (10 mg total) by mouth at bedtime. 30 tablet 3  . HYDROcodone-acetaminophen (NORCO/VICODIN) 5-325 MG tablet Take 1 tablet by mouth daily as needed for moderate pain.    [name] ipratropium (ATROVENT) 0.03 % nasal spray Place 2 sprays into both nostrils 2 (two) times daily.    [name] ipratropium-albuterol (DUONEB) 0.5-2.5 (3) MG/3ML SOLN Take 3 mLs by nebulization every 4 (four) hours as needed. 360 mL 1  . isosorbide mononitrate (IMDUR) 60 MG 24 hr tablet Take 2 tablets (120 mg total) by mouth every evening. 90 tablet 6  . losartan (COZAAR) 25 MG tablet Take 1 tablet (25 mg total) by mouth daily. 30 tablet 4  . metFORMIN (GLUCOPHAGE) 1000 MG tablet Take 1 tablet (1,000 mg total) by mouth 2 (two) times daily with a meal.    . metoprolol succinate (TOPROL XL) 25 MG 24 hr tablet Take 1 tablet (25 mg total) by mouth daily. 90 tablet 3  . nitroGLYCERIN (NITROSTAT) 0.4 MG SL tablet Place 1 tablet (0.4 mg total) under the tongue every 5 (five) minutes x 3 doses as needed for chest pain. 25 tablet 3  . OXYGEN Inhale into the lungs as needed. 3 liters n/c    . pantoprazole (PROTONIX) 40 MG tablet TAKE 1 TABLET EVERY DAY 90 tablet 3  . potassium chloride (KLOR-CON) 10 MEQ tablet Take 1 tablet (10 mEq total) by mouth daily. Only take while taking Torsemide 30 tablet 3  . torsemide (DEMADEX) 10 MG tablet Take 1 tablet (10 mg total) by mouth every other day. Take 20 mg for weight gain over 3 lbs in 24 hours 30 tablet 3  . warfarin (COUMADIN) 2 MG tablet Take 2 mg by mouth every evening. MANAGED BY  PMD     No current facility-administered medications for this encounter.   BP (!) 110/52   Pulse (!) 57   Wt 90.9 kg (200 lb 6.4 oz)   SpO2 95%   BMI 28.75 kg/m  General: NAD Neck: No JVD, no thyromegaly or thyroid nodule.  Lungs: Clear to auscultation bilaterally with normal respiratory effort. CV: Nondisplaced PMI.  Heart irregular S1/S2, no S3/S4, no murmur.  Trace ankle edema.  No carotid bruit.  Difficult to palpate pedal pulses.  Abdomen: Soft, nontender, no hepatosplenomegaly, no distention.  Skin: Intact without lesions or rashes.  Neurologic: Alert and oriented x 3.  Psych: Normal affect. Extremities: No  clubbing or cyanosis.  HEENT: Normal.   Assessment/Plan: 1. Chronic diastolic CHF: Echo in [id number] with EF 60-65%, mildly decreased RV systolic function with moderate RV enlargement, severe biatrial enlargement, PASP 75 mmHg, dilated IVC. After increasing diuretics, RHC in 1/22 showed normal filling pressures with moderate pulmonary hypertension. Weight is down 11 lbs and he does not look volume overloaded today. NYHA class II-III.  - Continue torsemide 10 mg every other day.  BMET today.  - Continue Jardiance 10 mg daily given HFpEF.  2. 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  Moderate PH on RHC in 1/22.  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  Less likely group 1 PH.  No PE on recent CTA.  - Will arrange for PFTs.  - Though CTA showed no PE, he should have V/Q scan for completeness.  3. Chronic hypoxemic respiratory failure: Remote smoker.  Hilar and mediastinal lymphadenopathy on recent CT, no reported ILD. Was on home oxygen but able to stop after more effective diuresis.  - Will repeat noncontrast chest CT in 3 months to followup on hilar and mediastinal lymphadenopathy => schedule for 4/22.  - Refer to pulmonary, can see Dr. Lamonte Sakai in [place].  4. CAD: s/p CABG in 2000.  Cath in 1/22 showed patent LIMA-LAD and SVG-D but occluded SVG-OM and SVG-RCA.  There was 60-70% ostial RCA stenosis  and 95% proximal RCA stenosis with heavy calcification.  Discussed with interventional, with normal EF plan was to proceed with initial medical management and proceed to PCI if chest pain worsened (PCI would require atherectomy). Minimal atypical chest pain recently.     - Continue ASA 81 and atorvastatin. Check lipids today.  - Continue Imdur 120 mg daily and Toprol XL 25 mg daily for angina control.  5. Atrial fibrillation: Permanent.  - On warfarin.  - Continue Toprol XL 25 daily.  6. PAD: Occluded right fem-pop bypass.  No claudication.   Followup in 1 month with NP/PA.   Loralie Champagne 04/26/2020

## 2020-04-26 NOTE — Patient Instructions (Addendum)
Labs done today, your results will be available in MyChart, we will contact you for abnormal readings.   Your physician has recommended that you have a pulmonary function test. Pulmonary Function Tests are a group of tests that measure how well air moves in and out of your lungs.   Covid test is scheduled for February 1,2022 @10 :61 @ [place] have been referred to pulmonary in [place] will contact you for an appointment   Non-Cardiac CT scanning, (CAT scanning), is a noninvasive, special x-ray that produces cross-sectional images of the body using x-rays and a computer. CT scans help physicians diagnose and treat medical conditions. For some CT exams, a contrast material is used to enhance visibility in the area of the body being studied. CT scans provide greater clarity and reveal more details than regular x-ray exams. It is a non contrast study.

## 2020-04-28 DIAGNOSIS — Z7901 Long term (current) use of anticoagulants: Secondary | ICD-10-CM | POA: Diagnosis not present

## 2020-04-29 DIAGNOSIS — I272 Pulmonary hypertension, unspecified: Secondary | ICD-10-CM | POA: Diagnosis not present

## 2020-04-29 DIAGNOSIS — J9601 Acute respiratory failure with hypoxia: Secondary | ICD-10-CM | POA: Diagnosis not present

## 2020-04-30 DIAGNOSIS — N182 Chronic kidney disease, stage 2 (mild): Secondary | ICD-10-CM | POA: Diagnosis not present

## 2020-04-30 DIAGNOSIS — I129 Hypertensive chronic kidney disease with stage 1 through stage 4 chronic kidney disease, or unspecified chronic kidney disease: Secondary | ICD-10-CM | POA: Diagnosis not present

## 2020-04-30 DIAGNOSIS — I25708 Atherosclerosis of coronary artery bypass graft(s), unspecified, with other forms of angina pectoris: Secondary | ICD-10-CM | POA: Diagnosis not present

## 2020-04-30 DIAGNOSIS — E1122 Type 2 diabetes mellitus with diabetic chronic kidney disease: Secondary | ICD-10-CM | POA: Diagnosis not present

## 2020-05-03 ENCOUNTER — Other Ambulatory Visit: Payer: Self-pay

## 2020-05-03 ENCOUNTER — Other Ambulatory Visit (HOSPITAL_COMMUNITY)
Admission: RE | Admit: 2020-05-03 | Discharge: 2020-05-03 | Disposition: A | Discharge status: Home / Self Care | Payer: Medicare HMO | Source: Ambulatory Visit | Attending: Cardiology | Admitting: Cardiology

## 2020-05-03 ENCOUNTER — Other Ambulatory Visit (HOSPITAL_COMMUNITY): Admission: RE | Admit: 2020-05-03 | Payer: Medicare HMO | Source: Ambulatory Visit

## 2020-05-03 DIAGNOSIS — Z20822 Contact with and (suspected) exposure to covid-19: Secondary | ICD-10-CM | POA: Diagnosis not present

## 2020-05-03 DIAGNOSIS — Z01812 Encounter for preprocedural laboratory examination: Secondary | ICD-10-CM | POA: Diagnosis not present

## 2020-05-04 LAB — SARS CORONAVIRUS 2 (TAT 6-24 HRS): SARS Coronavirus 2: NEGATIVE

## 2020-05-05 ENCOUNTER — Other Ambulatory Visit: Payer: Self-pay

## 2020-05-05 ENCOUNTER — Ambulatory Visit (HOSPITAL_COMMUNITY)
Admission: RE | Admit: 2020-05-05 | Discharge: 2020-05-05 | Disposition: A | Discharge status: Home / Self Care | Payer: Medicare HMO | Source: Ambulatory Visit | Attending: Cardiology | Admitting: Cardiology

## 2020-05-05 ENCOUNTER — Ambulatory Visit: Payer: Non-veteran care | Admitting: Cardiology

## 2020-05-05 DIAGNOSIS — J449 Chronic obstructive pulmonary disease, unspecified: Secondary | ICD-10-CM | POA: Insufficient documentation

## 2020-05-05 LAB — PULMONARY FUNCTION TEST
DL/VA % pred: 62 %
DL/VA: 2.46 ml/min/mmHg/L
DLCO unc % pred: 52 %
DLCO unc: 13.2 ml/min/mmHg
FEF 25-75 Post: 2.39 L/sec
FEF 25-75 Pre: 2.48 L/sec
FEF2575-%Change-Post: -3 %
FEF2575-%Pred-Post: 110 %
FEF2575-%Pred-Pre: 114 %
FEV1-%Change-Post: -1 %
FEV1-%Pred-Post: 92 %
FEV1-%Pred-Pre: 94 %
FEV1-Post: 2.83 L
FEV1-Pre: 2.87 L
FEV1FVC-%Change-Post: 0 %
FEV1FVC-%Pred-Pre: 105 %
FEV6-%Change-Post: 0 %
FEV6-%Pred-Post: 94 %
FEV6-%Pred-Pre: 94 %
FEV6-Post: 3.75 L
FEV6-Pre: 3.76 L
FEV6FVC-%Change-Post: 0 %
FEV6FVC-%Pred-Post: 106 %
FEV6FVC-%Pred-Pre: 105 %
FVC-%Change-Post: 0 %
FVC-%Pred-Post: 88 %
FVC-%Pred-Pre: 89 %
FVC-Post: 3.77 L
FVC-Pre: 3.79 L
Post FEV1/FVC ratio: 75 %
Post FEV6/FVC ratio: 100 %
Pre FEV1/FVC ratio: 76 %
Pre FEV6/FVC Ratio: 99 %
RV % pred: 116 %
RV: 3.04 L
TLC % pred: 94 %
TLC: 6.78 L

## 2020-05-05 MED ORDER — ALBUTEROL SULFATE (2.5 MG/3ML) 0.083% IN NEBU
2.5000 mg | INHALATION_SOLUTION | Freq: Once | RESPIRATORY_TRACT | Status: AC
  Administered 2020-05-05: 2.5 mg via RESPIRATORY_TRACT

## 2020-05-05 NOTE — Progress Notes (Deleted)
Clinical Summary [name] is a 80 y.o.male   Past Medical History:  Diagnosis Date  . AAA (abdominal aortic aneurysm) ([place])    Followed by Dr. Curt [name]  . Arthritis   . CAD (coronary artery disease)    a. CABG 2000.  [name] Cancer [facility])    Prostate:  Radiation Tx  . Carotid artery disease ([place])   . Chest pain    precordial. mild chronic .[name]... nonischemic  . CHF (congestive heart failure) ([place])   . Chronic edema   . Coronary artery disease    a. Nuclear, January, 2008, no ischemia b. Cath 08/2012- 1/4 patent grafts, RCA CTO, no flow-limiting disease, medically managed  . Diabetes mellitus without complication ([place])   . Dizziness 02/2011  . Dyslipidemia   . GERD (gastroesophageal reflux disease)    TAKES TUMS & ROLAIDS AS NEEDED  . Hx of CABG 2000  . Hypertension   . Myocardial infarction ([place]) 1999  . Neck pain 02/2011  . Pneumonia   . Pulmonary hypertension ([place])   . Renal artery stenosis ([facility])    50-70%  . S/P femoropopliteal bypass surgery    Dr. Donnetta [name]  . Sinus bradycardia    Asymptomatic     Allergies  Allergen Reactions  . Niacin Itching and Rash    Burning sensation  . Iodine-131 Nausea And Vomiting  . Nsaids Other (See Comments)    Taking Coumadin     Current Outpatient Medications  Medication Sig Dispense Refill  . acetaminophen (TYLENOL) 325 MG tablet Take 2 tablets (650 mg total) by mouth every 6 (six) hours as needed for mild pain, fever or headache (or Fever >/= 101). 12 tablet 0  . albuterol (VENTOLIN HFA) 108 (90 Base) MCG/ACT inhaler Inhale 2 puffs into the lungs every 6 (six) hours as needed for wheezing or shortness of breath. 1 each 2  . amLODipine (NORVASC) 5 MG tablet Take 1 tablet (5 mg total) by mouth daily. For BP 30 tablet 4  . aspirin 81 MG EC tablet Take 1 tablet (81 mg total) by mouth daily with breakfast. 30 tablet 3  . atorvastatin (LIPITOR) 40 MG tablet Take 1 tablet (40 mg total) by mouth daily. 90 tablet 1  . empagliflozin  (JARDIANCE) 10 MG TABS tablet Take 1 tablet (10 mg total) by mouth at bedtime. 30 tablet 3  . HYDROcodone-acetaminophen (NORCO/VICODIN) 5-325 MG tablet Take 1 tablet by mouth daily as needed for moderate pain.    [name] ipratropium (ATROVENT) 0.03 % nasal spray Place 2 sprays into both nostrils 2 (two) times daily.    [name] ipratropium-albuterol (DUONEB) 0.5-2.5 (3) MG/3ML SOLN Take 3 mLs by nebulization every 4 (four) hours as needed. 360 mL 1  . isosorbide mononitrate (IMDUR) 60 MG 24 hr tablet Take 2 tablets (120 mg total) by mouth every evening. 90 tablet 6  . losartan (COZAAR) 25 MG tablet Take 1 tablet (25 mg total) by mouth daily. 30 tablet 4  . metFORMIN (GLUCOPHAGE) 1000 MG tablet Take 1 tablet (1,000 mg total) by mouth 2 (two) times daily with a meal.    . metoprolol succinate (TOPROL XL) 25 MG 24 hr tablet Take 1 tablet (25 mg total) by mouth daily. 90 tablet 3  . nitroGLYCERIN (NITROSTAT) 0.4 MG SL tablet Place 1 tablet (0.4 mg total) under the tongue every 5 (five) minutes x 3 doses as needed for chest pain. 25 tablet 3  . OXYGEN Inhale into the lungs as needed. 3  liters n/c    . pantoprazole (PROTONIX) 40 MG tablet TAKE 1 TABLET EVERY DAY 90 tablet 3  . potassium chloride (KLOR-CON) 10 MEQ tablet Take 1 tablet (10 mEq total) by mouth daily. Only take while taking Torsemide 30 tablet 3  . torsemide (DEMADEX) 10 MG tablet Take 1 tablet (10 mg total) by mouth every other day. Take 20 mg for weight gain over 3 lbs in 24 hours 30 tablet 3  . warfarin (COUMADIN) 2 MG tablet Take 2 mg by mouth every evening. MANAGED BY PMD     No current facility-administered medications for this visit.     Past Surgical History:  Procedure Laterality Date  . CARDIAC CATHETERIZATION  09/26/2012   1/4 patent bypass (occluded SVG-PDA, SVG-OM, LIMA-LAD), SVG-diagonal patent and fills the diagonal and LAD, distal RCA occlusion with left to right collateralization, patent circumflex, LAD with no flow-limiting disease  and antegrade flow competitively from SVG-diagonal; EF 60-65%  . COLONOSCOPY  11/30/2009   VQM:[id number]. next TCS 11/2019  . COLONOSCOPY WITH PROPOFOL N/A 12/18/2019   Procedure: COLONOSCOPY WITH PROPOFOL;  Surgeon: Troy Quale, MD;  [facility];  Service: Gastroenterology;  Laterality: N/A;  1030  . CORONARY ARTERY BYPASS GRAFT  2000  . ESOPHAGOGASTRODUODENOSCOPY N/A 08/12/2013   TIW:[id number] peptic stricture with erosive refluxesophagitis - status post Maloney dilation. Hiatal hernia. Abnormalgastric mucosa. Deformity of the pyloric channel suggestive ofprior peptic ulcer disease. Duodenal bulbar diverticulum Statuspost gastric biopsy. h.pylori  . LEFT HEART CATHETERIZATION WITH CORONARY/GRAFT ANGIOGRAM N/A 09/26/2012   Procedure: LEFT HEART CATHETERIZATION WITH Troy [name];  Surgeon: Troy Blanks, MD;  Location: [facility] CATH LAB;  Service: Cardiovascular;  Laterality: N/A;  . Troy [name] DILATION N/A 08/12/2013   Procedure: Troy [name] DILATION;  Surgeon: Troy Dolin, MD;  Location: [facility];  Service: Endoscopy;  Laterality: N/A;  . POLYPECTOMY  12/18/2019   Procedure: POLYPECTOMY;  Surgeon: Troy Quale, MD;  Location: [facility];  Service: Gastroenterology;;  ascending colon polyp   . PR VEIN BYPASS GRAFT,AORTO-FEM-POP Right 07/19/1999  . PR VEIN BYPASS GRAFT,AORTO-FEM-POP Left 05/02/2006  . RIGHT HEART CATH AND CORONARY/GRAFT ANGIOGRAPHY N/A 04/11/2020   Procedure: RIGHT HEART CATH AND CORONARY/GRAFT ANGIOGRAPHY;  Surgeon: Troy Dresser, MD;  Location: [facility] CV LAB;  Service: Cardiovascular;  Laterality: N/A;  . SAVORY DILATION N/A 08/12/2013   Procedure: SAVORY DILATION;  Surgeon: Troy Dolin, MD;  Location[facility];  Service: Endoscopy;  Laterality: N/A;  . WRIST SURGERY     cyst removal     Allergies  Allergen Reactions  . Niacin Itching and Rash    Burning sensation  . Iodine-131 Nausea And  Vomiting  . Nsaids Other (See Comments)    Taking Coumadin      Family History  Problem Relation Age of Onset  . Deep vein thrombosis Father   . Lung cancer Sister   . Diabetes Sister   . Heart disease Sister        After age 20  . Hyperlipidemia Sister   . Hypertension Sister   . Lung cancer Sister   . Breast cancer Sister   . Hypertension Mother   . Diabetes Sister   . Coronary artery disease Other        family hx of  . Cancer Brother        "crab cancer"  . Heart disease Brother   . Heart attack Brother   . Heart attack Daughter   .  Colon cancer Neg Hx   . Stroke Neg Hx      Social History Mr. Batterton reports that he quit smoking about 22 years ago. He has never used smokeless tobacco. Mr. Tillman reports current alcohol use.   Review of Systems CONSTITUTIONAL: No weight loss, fever, chills, weakness or fatigue.  HEENT: Eyes: No visual loss, blurred vision, double vision or yellow sclerae.No hearing loss, sneezing, congestion, runny nose or sore throat.  SKIN: No rash or itching.  CARDIOVASCULAR:  RESPIRATORY: No shortness of breath, cough or sputum.  GASTROINTESTINAL: No anorexia, nausea, vomiting or diarrhea. No abdominal pain or blood.  GENITOURINARY: No burning on urination, no polyuria NEUROLOGICAL: No headache, dizziness, syncope, paralysis, ataxia, numbness or tingling in the extremities. No change in bowel or bladder control.  MUSCULOSKELETAL: No muscle, back pain, joint pain or stiffness.  LYMPHATICS: No enlarged nodes. No history of splenectomy.  PSYCHIATRIC: No history of depression or anxiety.  ENDOCRINOLOGIC: No reports of sweating, cold or heat intolerance. No polyuria or polydipsia.  [name]   Physical Examination There were no vitals filed for this visit. There were no vitals filed for this visit.  Gen: resting comfortably, no acute distress HEENT: no scleral icterus, pupils equal round and reactive, no palptable cervical adenopathy,  CV Resp:  Clear to auscultation bilaterally GI: abdomen is soft, non-tender, non-distended, normal bowel sounds, no hepatosplenomegaly MSK: extremities are warm, no edema.  Skin: warm, no rash Neuro:  no focal deficits Psych: appropriate affect   Diagnostic Studies     Assessment and Plan        Troy [name], M.D., F.A.C.C.

## 2020-05-06 ENCOUNTER — Telehealth (HOSPITAL_COMMUNITY): Payer: Self-pay

## 2020-05-06 NOTE — Telephone Encounter (Signed)
-----   Message from Harvie Junior, [place] sent at 05/06/2020  3:45 PM EST ----- Dyann Ruddle please call.

## 2020-05-06 NOTE — Telephone Encounter (Signed)
Spoke with patient, aware patient aware of PFT  results

## 2020-05-06 NOTE — Telephone Encounter (Signed)
Spoke with patient and aware.

## 2020-05-24 NOTE — Progress Notes (Signed)
PCP: Sharilyn Sites, MD HF Cardiology: Dr. Aundra Dubin  80 y.o. with history of permanent atrial fibrillation, CAD s/p CABG 2000, PAD, chronic diastolic CHF was referred to CHF clinic by Melina Copa, PA, for evaluation of CHF and pulmonary hypertension.  Patient had CABG in 2000, last cath was in 2014 showing occluded SVG-OM, occluded SVG-PDA, occluded RCA, and atretic LIMA.  SVG-D was patent and native LAD was patent.  Patient had bilateral fem-pop bypasses, peripheral dopplers in 7/21 showed that the right fem-pop was occluded.  He is in permanent atrial fibrillation on warfarin. Echo in 10/21 showed normal LV systolic function with moderately dilated/mildly dysfunctional RV and PASP 75 mmHg.  In 12/21, he was admitted with PNA and treated with antibiotics.  CTA chest showed no PE, RML PNA, and bilateral hilar + mediastinal lymphadenopathy.  He was sent home from this admission on home oxygen.   In 1/22, RHC/LHC was done.  This showed patent LIMA-LAD and SVG-D, occluded SVG-OM and occluded SVG-RCA, 95% calcified proximal RCA stenosis, totally occluded PLV with collaterals (known from prior).  There was moderate pulmonary hypertension with optimized left and right heart filling pressures.  I reviewed the cath with interventional cardiology, would require atherectomy to treat the RCA.  We decided to manage medically initially.   Today he returns for HF follow up. Last visit 1 month ago weight was down 11 lbs, breathing improved, rare atypical chest tightness. Still having chronic chest tightness, but has not worsened, no recent SL nitro use. Overall feeling fine, breathing is at baseline. Struggling with arthritis. Denies increasing SOB, CP, dizziness, edema, or PND/Orthopnea. Appetite ok. No fever or chills. Weight at home 204-205 lbs. Taking all medications. No longer on oxygen.  Labs (12/21): K 3.8, creatinine 0.83, hgb 9.5 Labs (1/22): K 3.8, creatinine 1.22 Labs (1/22): K 3.5, creatinine 1.13, HDL 22, LDL  49, total chol 93  PMH: 1. Atrial fibrillation: Permanent.  2. CAD: CABG 2000.  - LHC (2014) with totally occluded RCA, totally occluded SVG-RCA, totally occluded SVG-OM, atretic LIMA, patent SVG-D.   - LCH (1/22): patent LIMA-LAD and SVG-D, occluded SVG-OM and occluded SVG-RCA, 60-70% calcified ostial RCA stenosis and 95% calcified proximal RCA stenosis, totally occluded PLV with collaterals (known from prior). 3. HTN 4. PAD: s/p bilateral fem-pop bypasses.   - Peripheral arterial dopplers (7/21): Occluded right fem-pop, patent left fem-pop.  5. Type 2 diabetes 6. Prostate cancer: Treated with radiation. 7. PVCs 8. History of renal artery stenosis.  9. Right vocal cord paralysis.  10. Chronic diastolic CHF: Echo (38/93) with EF 60-65%, mildly decreased RV systolic function with moderate RV enlargement, severe biatrial enlargement, PASP 75 mmHg, dilated IVC.  - RHC (1/22): mean RA 6, PA 65/18 mean 35, mean PCWP 14, CI 2.71, PVR 3.7 WU.  11. 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   SH: Lives in [place] with wife.  Retired.  Remote smoker (>20 years ago). No ETOH.   Family History  Problem Relation Age of Onset   Deep vein thrombosis Father    Lung cancer Sister    Diabetes Sister    Heart disease Sister        After age 73   Hyperlipidemia Sister    Hypertension Sister    Lung cancer Sister    Breast cancer Sister    Hypertension Mother    Diabetes Sister    Coronary artery disease Other        family hx of   Cancer Brother        "  crab cancer"   Heart disease Brother    Heart attack Brother    Heart attack Daughter    Colon cancer Neg Hx    Stroke Neg Hx    ROS: All systems reviewed and negative except as per HPI.   Current Outpatient Medications  Medication Sig Dispense Refill   acetaminophen (TYLENOL) 325 MG tablet Take 2 tablets (650 mg total) by mouth every 6 (six) hours as needed for mild pain, fever or headache (or Fever >/= 101). 12 tablet 0    albuterol (VENTOLIN HFA) 108 (90 Base) MCG/ACT inhaler Inhale 2 puffs into the lungs every 6 (six) hours as needed for wheezing or shortness of breath. 1 each 2   amLODipine (NORVASC) 5 MG tablet Take 1 tablet (5 mg total) by mouth daily. For BP 30 tablet 4   aspirin 81 MG EC tablet Take 1 tablet (81 mg total) by mouth daily with breakfast. 30 tablet 3   atorvastatin (LIPITOR) 40 MG tablet Take 1 tablet (40 mg total) by mouth daily. 90 tablet 1   empagliflozin (JARDIANCE) 10 MG TABS tablet Take 1 tablet (10 mg total) by mouth at bedtime. 30 tablet 3   HYDROcodone-acetaminophen (NORCO/VICODIN) 5-325 MG tablet Take 1 tablet by mouth daily as needed for moderate pain.     ipratropium (ATROVENT) 0.03 % nasal spray Place 2 sprays into both nostrils 2 (two) times daily.     ipratropium-albuterol (DUONEB) 0.5-2.5 (3) MG/3ML SOLN Take 3 mLs by nebulization every 4 (four) hours as needed. 360 mL 1   isosorbide mononitrate (IMDUR) 60 MG 24 hr tablet Take 2 tablets (120 mg total) by mouth every evening. 90 tablet 6   losartan (COZAAR) 25 MG tablet Take 1 tablet (25 mg total) by mouth daily. 30 tablet 4   metFORMIN (GLUCOPHAGE) 1000 MG tablet Take 1 tablet (1,000 mg total) by mouth 2 (two) times daily with a meal.     metoprolol succinate (TOPROL XL) 25 MG 24 hr tablet Take 1 tablet (25 mg total) by mouth daily. 90 tablet 3   nitroGLYCERIN (NITROSTAT) 0.4 MG SL tablet Place 1 tablet (0.4 mg total) under the tongue every 5 (five) minutes x 3 doses as needed for chest pain. 25 tablet 3   OXYGEN Inhale into the lungs as needed. 3 liters n/c     pantoprazole (PROTONIX) 40 MG tablet TAKE 1 TABLET EVERY DAY 90 tablet 3   potassium chloride (KLOR-CON) 10 MEQ tablet Take 1 tablet (10 mEq total) by mouth daily. Only take while taking Torsemide 30 tablet 3   torsemide (DEMADEX) 10 MG tablet Take 1 tablet (10 mg total) by mouth every other day. Take 20 mg for weight gain over 3 lbs in 24 hours 30 tablet 3    warfarin (COUMADIN) 2 MG tablet Take 2 mg by mouth every evening. MANAGED BY PMD     No current facility-administered medications for this encounter.   BP (!) 142/70    Pulse (!) 56    Wt 93 kg    SpO2 96%    BMI 29.41 kg/m    Wt Readings from Last 3 Encounters:  05/25/20 93 kg  04/26/20 90.9 kg  04/11/20 93 kg   Physical Exam: General:  NAD. No resp difficulty, elderly. HEENT: Normal Neck: Supple. No JVD. Carotids 2+ bilat; no bruits. No lymphadenopathy or thryomegaly appreciated. Cor: PMI nondisplaced. Irregular rate & rhythm. No rubs, gallops or murmurs. Lungs: Clear, diminished in bases Abdomen: Soft, nontender, nondistended. No  hepatosplenomegaly. No bruits or masses. Good bowel sounds. Extremities: No cyanosis, clubbing, rash, trace pedal edema, L>R Neuro: alert & oriented x 3, cranial nerves grossly intact. Moves all 4 extremities w/o difficulty. Affect pleasant.  Assessment/Plan: 1. Chronic diastolic CHF: Echo in 16/10 with EF 60-65%, mildly decreased RV systolic function with moderate RV enlargement, severe biatrial enlargement, PASP 75 mmHg, dilated IVC. After increasing diuretics, RHC in 1/22 showed normal filling pressures with moderate pulmonary hypertension. He does not look volume overloaded today. NYHA class II-III, physical activity limited by arthritis.  - Continue torsemide 10 mg every other day.  BMET today.  - Continue Jardiance 10 mg daily given HFpEF. Will reach out to pharmacy to see if we can offer assistance with co-pay. 2. 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  Moderate PH on RHC in 1/22.  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  Less likely group 1 PH. No PE on recent CTA.  - Repeat PFTs with low DLCO, moderately severe diffusion defect. - Though CTA showed no PE, he should have V/Q scan for completeness. This has been ordered. 3. Chronic hypoxemic respiratory failure: Remote smoker.  Hilar and mediastinal lymphadenopathy on recent CT, no reported ILD. Was on home oxygen  but able to stop after more effective diuresis.  - Will repeat noncontrast chest CT in 3 months to followup on hilar and mediastinal lymphadenopathy => schedule for 4/22.  - Referred to pulmonary, sees Dr. Melvyn Novas 06/07/20. 4. CAD: s/p CABG in 2000.  Cath in 1/22 showed patent LIMA-LAD and SVG-D but occluded SVG-OM and SVG-RCA.  There was 60-70% ostial RCA stenosis and 95% proximal RCA stenosis with heavy calcification.  Discussed with interventional, with normal EF plan was to proceed with initial medical management and proceed to PCI if chest pain worsened (PCI would require atherectomy). Minimal atypical chest pain recently.     - Continue ASA 81 and atorvastatin.   - Continue Imdur 120 mg daily and Toprol XL 25 mg daily for angina control.  5. Atrial fibrillation: Permanent.  - On warfarin. INR checked by PCP. - Continue Toprol XL 25 daily.  6. PAD: Occluded right fem-pop bypass.  No claudication.   Followup in 2-3 months with Dr. Wynema Birch M Rad Gramling-FNP-BC 05/25/2020

## 2020-05-25 ENCOUNTER — Encounter (HOSPITAL_COMMUNITY): Payer: Self-pay

## 2020-05-25 ENCOUNTER — Other Ambulatory Visit: Payer: Self-pay

## 2020-05-25 ENCOUNTER — Ambulatory Visit (HOSPITAL_COMMUNITY)
Admission: RE | Admit: 2020-05-25 | Discharge: 2020-05-25 | Disposition: A | Discharge status: Home / Self Care | Payer: Medicare HMO | Source: Ambulatory Visit | Attending: Family Medicine | Admitting: Family Medicine

## 2020-05-25 VITALS — BP 142/70 | HR 56 | Wt 205.0 lb

## 2020-05-25 DIAGNOSIS — Z951 Presence of aortocoronary bypass graft: Secondary | ICD-10-CM | POA: Diagnosis not present

## 2020-05-25 DIAGNOSIS — Z7982 Long term (current) use of aspirin: Secondary | ICD-10-CM | POA: Insufficient documentation

## 2020-05-25 DIAGNOSIS — Z7901 Long term (current) use of anticoagulants: Secondary | ICD-10-CM | POA: Diagnosis not present

## 2020-05-25 DIAGNOSIS — J9611 Chronic respiratory failure with hypoxia: Secondary | ICD-10-CM | POA: Diagnosis not present

## 2020-05-25 DIAGNOSIS — E119 Type 2 diabetes mellitus without complications: Secondary | ICD-10-CM | POA: Insufficient documentation

## 2020-05-25 DIAGNOSIS — I110 Hypertensive heart disease with heart failure: Secondary | ICD-10-CM | POA: Insufficient documentation

## 2020-05-25 DIAGNOSIS — Z95828 Presence of other vascular implants and grafts: Secondary | ICD-10-CM

## 2020-05-25 DIAGNOSIS — Z79899 Other long term (current) drug therapy: Secondary | ICD-10-CM | POA: Diagnosis not present

## 2020-05-25 DIAGNOSIS — Z87891 Personal history of nicotine dependence: Secondary | ICD-10-CM | POA: Diagnosis not present

## 2020-05-25 DIAGNOSIS — I4821 Permanent atrial fibrillation: Secondary | ICD-10-CM | POA: Diagnosis not present

## 2020-05-25 DIAGNOSIS — R590 Localized enlarged lymph nodes: Secondary | ICD-10-CM | POA: Diagnosis not present

## 2020-05-25 DIAGNOSIS — I25119 Atherosclerotic heart disease of native coronary artery with unspecified angina pectoris: Secondary | ICD-10-CM | POA: Diagnosis not present

## 2020-05-25 DIAGNOSIS — Z7984 Long term (current) use of oral hypoglycemic drugs: Secondary | ICD-10-CM | POA: Diagnosis not present

## 2020-05-25 DIAGNOSIS — I5032 Chronic diastolic (congestive) heart failure: Secondary | ICD-10-CM | POA: Diagnosis not present

## 2020-05-25 DIAGNOSIS — I272 Pulmonary hypertension, unspecified: Secondary | ICD-10-CM | POA: Insufficient documentation

## 2020-05-25 DIAGNOSIS — I739 Peripheral vascular disease, unspecified: Secondary | ICD-10-CM | POA: Insufficient documentation

## 2020-05-25 DIAGNOSIS — I2510 Atherosclerotic heart disease of native coronary artery without angina pectoris: Secondary | ICD-10-CM

## 2020-05-25 LAB — BASIC METABOLIC PANEL
Anion gap: 13 (ref 5–15)
BUN: 17 mg/dL (ref 8–23)
CO2: 25 mmol/L (ref 22–32)
Calcium: 9.2 mg/dL (ref 8.9–10.3)
Chloride: 104 mmol/L (ref 98–111)
Creatinine, Ser: 0.86 mg/dL (ref 0.61–1.24)
GFR, Estimated: >60 mL/min (ref >60)
Glucose, Bld: 109 mg/dL — ABNORMAL HIGH (ref 70–99)
Potassium: 3.6 mmol/L (ref 3.5–5.1)
Sodium: 142 mmol/L (ref 135–145)

## 2020-05-25 NOTE — Patient Instructions (Signed)
It was great to see you today! No medication changes are needed at this time.  Labs today We will only contact you if something comes back abnormal or we need to make some changes. Otherwise no news is good news!  Your physician recommends that you schedule a follow-up appointment in: 3 months with Dr McLean  Do the following things EVERYDAY: 1) Weigh yourself in the morning before breakfast. Write it down and keep it in a log. 2) Take your medicines as prescribed 3) Eat low salt foods--Limit salt (sodium) to 2000 mg per day.  4) Stay as active as you can everyday 5) Limit all fluids for the day to less than 2 liters At [facility], you and your health needs are our priority. As part of our continuing mission to provide you with exceptional heart care, we have created designated Provider Care Teams. These Care Teams include your primary Cardiologist (physician) and Advanced Practice Providers (APPs- Physician Assistants and Nurse Practitioners) who all work together to provide you with the care you need, when you need it.   You may see any of the following providers on your designated Care Team at your next follow up: . Dr Daniel Bensimhon . Dr Dalton McLean . Dr Brandon Winfrey . Amy Clegg, NP . Brittainy Simmons, PA . Jessica Milford,NP . Lauren Kemp, PharmD   Please be sure to bring in all your medications bottles to every appointment.   If you have any questions or concerns before your next appointment please send us a message through mychart or call our office at [phone].    TO LEAVE A MESSAGE FOR THE NURSE SELECT OPTION 2, PLEASE LEAVE A MESSAGE INCLUDING: . YOUR NAME . DATE OF BIRTH . CALL BACK NUMBER . REASON FOR CALL**this is important as we prioritize the call backs  YOU WILL RECEIVE A CALL BACK THE SAME DAY AS LONG AS YOU CALL BEFORE 4:00 PM  

## 2020-05-30 DIAGNOSIS — I272 Pulmonary hypertension, unspecified: Secondary | ICD-10-CM | POA: Diagnosis not present

## 2020-05-30 DIAGNOSIS — J9601 Acute respiratory failure with hypoxia: Secondary | ICD-10-CM | POA: Diagnosis not present

## 2020-06-01 DIAGNOSIS — Z7901 Long term (current) use of anticoagulants: Secondary | ICD-10-CM | POA: Diagnosis not present

## 2020-06-06 DIAGNOSIS — Z7901 Long term (current) use of anticoagulants: Secondary | ICD-10-CM | POA: Diagnosis not present

## 2020-06-07 ENCOUNTER — Ambulatory Visit: Payer: Medicare HMO | Admitting: Internal Medicine

## 2020-06-07 ENCOUNTER — Other Ambulatory Visit: Payer: Self-pay

## 2020-06-07 ENCOUNTER — Encounter: Payer: Self-pay | Admitting: Internal Medicine

## 2020-06-07 ENCOUNTER — Ambulatory Visit (HOSPITAL_COMMUNITY)
Admission: RE | Admit: 2020-06-07 | Discharge: 2020-06-07 | Disposition: A | Discharge status: Home / Self Care | Payer: Medicare HMO | Source: Ambulatory Visit | Attending: Internal Medicine | Admitting: Internal Medicine

## 2020-06-07 VITALS — BP 128/72 | HR 52 | Temp 97.6°F | Ht 71.0 in | Wt 205.2 lb

## 2020-06-07 DIAGNOSIS — I272 Pulmonary hypertension, unspecified: Secondary | ICD-10-CM

## 2020-06-07 DIAGNOSIS — R0600 Dyspnea, unspecified: Secondary | ICD-10-CM | POA: Diagnosis not present

## 2020-06-07 DIAGNOSIS — R0609 Other forms of dyspnea: Secondary | ICD-10-CM

## 2020-06-07 DIAGNOSIS — R059 Cough, unspecified: Secondary | ICD-10-CM | POA: Diagnosis not present

## 2020-06-07 MED ORDER — FAMOTIDINE 20 MG PO TABS: ORAL_TABLET | ORAL | 11 refills | Status: DC

## 2020-06-07 MED ORDER — PANTOPRAZOLE SODIUM 40 MG PO TBEC: 40.0000 mg | DELAYED_RELEASE_TABLET | Freq: Every day | ORAL | Status: DC

## 2020-06-07 NOTE — Patient Instructions (Addendum)
Try albuterol (the puffer one day, the nebulizer the next)  15 min before an activity that you know would make you short of breath and see if it makes any difference and if makes none then don't take it after activity unless you can't catch your breath.      Change the way you take your acid pills:   Pantoprazole (protonix) 40 mg   Take  30-60 min before first meal of the day and Pepcid (famotidine)  20 mg one after the last meal until return to office - this is the best way to tell whether stomach acid is contributing to your problem.    GERD (REFLUX)  is an extremely common cause of respiratory symptoms just like yours , many times with no obvious heartburn at all.    It can be treated with medication, but also with lifestyle changes including elevation of the head of your bed (ideally with 6 -8inch blocks under the headboard of your bed),  Smoking cessation, avoidance of late meals, excessive alcohol, and avoid fatty foods, chocolate, peppermint, colas, red wine, and acidic juices such as orange juice.  NO MINT OR MENTHOL PRODUCTS SO NO COUGH DROPS  USE SUGARLESS CANDY INSTEAD (Jolley ranchers or Stover's or Life Savers) or even ice chips will also do - the key is to swallow to prevent all throat clearing. NO OIL BASED VITAMINS - use powdered substitutes.  Avoid fish oil when coughing.   Discuss any sinus problems with your  ENT (ENT is a sinus doctor )   Please schedule a follow up office visit in 6 weeks, call sooner if needed

## 2020-06-07 NOTE — Progress Notes (Signed)
Troy [name], male    DOB: 1940-10-22,     MRN: [id number]   Brief patient profile:  7 yowm with known IHD quit smoking 04/2018 with onset around 2010 dysphagia and recurrent aspiration / pneumonia eval by Benjamine Mola with dx of paralyzed R VC last eval 04/12/20 doing "ok" per [facility] clinic referred to pulmonary clinic in [place]  06/07/2020 by Dr    Marigene Ehlers for recurrent asp and mild/mod Pulmonary hypertension     History of Present Illness  06/07/2020  Pulmonary/ 1st office eval/ Troy [name] / [facility] Office  Chief Complaint  Patient presents with  . Pulmonary Consult    Referred by Dr Loralie Champagne.  Pt c/o trouble with his breathing since Dec 2021. He states has hx of recurrent PNA and aspiration. He gets winded "walking a long distance". He also c/o cough with white sputum. He has albuterol inhaler and neb and has not been using these.   Dyspnea:  Has not done much since last admit   using walker at home and able to do [employer] shopping leaning on cart  Cough: feels something hanging in throat, worse with swallowing food/ worse in am's assoc with sense of pnds  Keeps a can by bed > white mucus  Sleep: flat bed / one pillow on side  SABA use: not really feeling like he needs it now but has duoneb and hfa  02 stopped 3 weeks prior to OV  And no worse off it "can I turn it back in"   No obvious day to day or daytime variability or assoc excess/ purulent sputum or mucus plugs or hemoptysis or cp or chest tightness, subjective wheeze or overt sinus or hb symptoms.   sleeping without nocturnal    exacerbation  of respiratory  c/o's or need for noct saba. Also denies any obvious fluctuation of symptoms with weather or environmental changes or other aggravating or alleviating factors except as outlined above   No unusual exposure hx or h/o childhood pna/ asthma or knowledge of premature birth.  Current Allergies, Complete Past Medical History, Past Surgical History, Family History, and Social History  were reviewed in [employer] record.  ROS  The following are not active complaints unless bolded Hoarseness, sore throat, dysphagia, dental problems, itching, sneezing,  nasal congestion or discharge of excess mucus or purulent secretions, ear ache,   fever, chills, sweats, unintended wt loss or wt gain, classically pleuritic or exertional cp,  orthopnea pnd or arm/hand swelling  or leg swelling, presyncope, palpitations, abdominal pain, anorexia, nausea, vomiting, diarrhea  or change in bowel habits or change in bladder habits, change in stools or change in urine, dysuria, hematuria,  rash, arthralgias, visual complaints, headache, numbness, weakness or ataxia or problems with walking or coordination,  change in mood or  memory.           Past Medical History:  Diagnosis Date  . AAA (abdominal aortic aneurysm) ([place])    Followed by Dr. Curt Jews  . Arthritis   . CAD (coronary artery disease)    a. CABG 2000.  [name])    Prostate:  Radiation Tx  . Carotid artery disease ([place])   . Chest pain    precordial. mild chronic .[name]... nonischemic  . CHF (congestive heart failure) ([place])   . Chronic edema   . Coronary artery disease    a. Nuclear, January, 2008, no ischemia b. Cath 08/2012- 1/4 patent grafts, RCA CTO, no flow-limiting disease, medically  managed  . Diabetes mellitus without complication ([place])   . Dizziness 02/2011  . Dyslipidemia   . GERD (gastroesophageal reflux disease)    TAKES TUMS & ROLAIDS AS NEEDED  . Hx of CABG 2000  . Hypertension   . Myocardial infarction ([place]) 1999  . Neck pain 02/2011  . Pneumonia   . Pulmonary hypertension ([place])   . Renal artery stenosis ([facility])    50-70%  . S/P femoropopliteal bypass surgery    Dr. Donnetta Hutching  . Sinus bradycardia    Asymptomatic    Outpatient Medications Prior to Visit  Medication Sig Dispense Refill  . acetaminophen (TYLENOL) 325 MG tablet Take 2 tablets (650 mg total) by mouth every 6 (six) hours as  needed for mild pain, fever or headache (or Fever >/= 101). 12 tablet 0  . albuterol (VENTOLIN HFA) 108 (90 Base) MCG/ACT inhaler Inhale 2 puffs into the lungs every 6 (six) hours as needed for wheezing or shortness of breath. 1 each 2  . amLODipine (NORVASC) 5 MG tablet Take 1 tablet (5 mg total) by mouth daily. For BP 30 tablet 4  . aspirin 81 MG EC tablet Take 1 tablet (81 mg total) by mouth daily with breakfast. 30 tablet 3  . atorvastatin (LIPITOR) 40 MG tablet Take 1 tablet (40 mg total) by mouth daily. 90 tablet 1  . empagliflozin (JARDIANCE) 10 MG TABS tablet Take 1 tablet (10 mg total) by mouth at bedtime. 30 tablet 3  . HYDROcodone-acetaminophen (NORCO/VICODIN) 5-325 MG tablet Take 1 tablet by mouth daily as needed for moderate pain.    [name] ipratropium (ATROVENT) 0.03 % nasal spray Place 2 sprays into both nostrils 2 (two) times daily.    [name] ipratropium-albuterol (DUONEB) 0.5-2.5 (3) MG/3ML SOLN Take 3 mLs by nebulization every 4 (four) hours as needed. 360 mL 1  . isosorbide mononitrate (IMDUR) 60 MG 24 hr tablet Take 2 tablets (120 mg total) by mouth every evening. 90 tablet 6  . losartan (COZAAR) 25 MG tablet Take 1 tablet (25 mg total) by mouth daily. 30 tablet 4  . metFORMIN (GLUCOPHAGE) 1000 MG tablet Take 1 tablet (1,000 mg total) by mouth 2 (two) times daily with a meal.    . metoprolol succinate (TOPROL XL) 25 MG 24 hr tablet Take 1 tablet (25 mg total) by mouth daily. 90 tablet 3  . nitroGLYCERIN (NITROSTAT) 0.4 MG SL tablet Place 1 tablet (0.4 mg total) under the tongue every 5 (five) minutes x 3 doses as needed for chest pain. 25 tablet 3  . OXYGEN Inhale into the lungs as needed. 3 liters n/c    . pantoprazole (PROTONIX) 40 MG tablet TAKE 1 TABLET EVERY DAY 90 tablet 3  . potassium chloride (KLOR-CON) 10 MEQ tablet Take 1 tablet (10 mEq total) by mouth daily. Only take while taking Torsemide 30 tablet 3  . torsemide (DEMADEX) 10 MG tablet Take 1 tablet (10 mg total) by mouth  every other day. Take 20 mg for weight gain over 3 lbs in 24 hours 30 tablet 3  . warfarin (COUMADIN) 2 MG tablet Take 2 mg by mouth every evening. MANAGED BY PMD     No facility-administered medications prior to visit.     Objective:     BP 128/72 (BP Location: Left Arm, Cuff Size: Normal)   Pulse (!) 52   Temp 97.6 F (36.4 C) (Temporal)   Ht 5\' 11"  (1.803 m)   Wt 205 lb 3.2 oz (93.1 kg)  SpO2 98% Comment: on RA  BMI 28.62 kg/m   SpO2: 98 % (on RA)     HEENT : pt wearing mask not removed for exam due to covid -19 concerns.    NECK :  without JVD/Nodes/TM/ nl carotid upstrokes bilaterally   LUNGS: no acc muscle use,  Nl contour chest which is clear to A and P bilaterally without cough on insp or exp maneuvers   CV:  RRR  no s3 or murmur or increase in P2, and  Trace pitting L>R despite elastic hose   ABD:  soft and nontender with nl inspiratory excursion in the supine position. No bruits or organomegaly appreciated, bowel sounds nl  MS:  slt awkward/ stiff gait/ ext warm without deformities, calf tenderness, cyanosis or clubbing No obvious joint restrictions   SKIN: warm and dry without lesions    NEURO:  alert, approp, nl sensorium with  no motor or cerebellar deficits apparent.      I personally reviewed images and agree with radiology impression as follows:   Chest CT 03/26/20 1. No CT evidence for acute pulmonary embolus. 2. Cardiomegaly. Enlargement of the pulmonary outflow tract and main pulmonary arteries suggests pulmonary arterial hypertension. 3. Volume loss and airspace consolidation in the right middle lobe and posterior right lower lobe. Imaging features in the right middle lobe are highly suggestive of infection. Right lower lobe airspace disease may be related to atelectasis or pneumonia. May be related to atelectasis and/or pneumonia. Associated small to moderate right pleural effusion. 4. Mediastinal and bilateral hilar lymphadenopathy,  potentially reactive. Metastatic disease or lymphoma not excluded. Follow-up CT in 3 months recommended to further evaluate. 5. Hepatic steatosis. Subtle nodular liver contour suggests cirrhosis. 2.6 cm low-density lesion in the left liver dome cannot be definitively characterized. When the patient is clinically stable and able to follow directions and hold their breath further evaluation with dedicated abdominal MRI should be considered. 6. Aortic Atherosclerosis (ICD10-I70.0) and Emphysema (ICD10-J43.9). Assessment   DOE (dyspnea on exertion) 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 - RHC  04/11/20 RA mean 6 RV 64/6 PA 65/18, mean 35 PCWP mean 14 Ao 112/45 Oxygen saturations: PA 62% AO 100% Cardiac Output (Fick) 5.71  Cardiac Index (Fick) 2.71 PVR 3.7 WU - PFT's  05/05/20  FEV1 2.87 (94 % ) ratio 0.76  p 0 % improvement from saba p o prior to study with DLCO  13.20 (53%) corrects to 2.46 (62 %)  for alv volume and FV curve   -  06/07/2020   Walked RA  approx   300 ft  @ slow to moderate pace  stopped due  to hip pain/no SOB with sats 93% at end    - ONO RA rec 06/07/2020 >>>   C/w WHO III PH for which rx is correct the underlying problem - in this case not copd but rather recurrent aspiration assoc with chronic R VC paralysis actively being treated by [facility] and possible partly related to chronic sinus dz (deferred to [facility]) and or GERD for which he takes PPI at the least effective time of the day and not following other guidelines so rec  Finish w/u with ONO RA and optimize gerd/sinus rx   F/u in 6 weeks    Each maintenance medication was reviewed in detail including emphasizing most importantly the difference between maintenance and prns and under what circumstances the prns are to be triggered using an action plan format where appropriate.  Total time for H and P, chart review, counseling,  directly observing portions of ambulatory 02 saturation  study/ and generating customized AVS unique to this initial office visit / same day charting = 51 min                      Christinia Gully, MD 06/07/2020

## 2020-06-07 NOTE — Assessment & Plan Note (Signed)
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 - RHC  04/11/20 RA mean 6 RV 64/6 PA 65/18, mean 35 PCWP mean 14 Ao 112/45 Oxygen saturations: PA 62% AO 100% Cardiac Output (Fick) 5.71  Cardiac Index (Fick) 2.71 PVR 3.7 WU - PFT's  05/05/20  FEV1 2.87 (94 % ) ratio 0.76  p 0 % improvement from saba p o prior to study with DLCO  13.20 (53%) corrects to 2.46 (62 %)  for alv volume and FV curve   -  06/07/2020   Walked RA  approx   300 ft  @ slow to moderate pace  stopped due  to hip pain/no SOB with sats 93% at end    - ONO RA rec 06/07/2020 >>>   C/w WHO III PH for which rx is correct the underlying problem - in this case not copd but rather recurrent aspiration assoc with chronic R VC paralysis actively being treated by [facility] and possible partly related to chronic sinus dz (deferred to [facility]) and or GERD for which he takes PPI at the least effective time of the day and not following other guidelines so rec  Finish w/u with ONO RA and optimize gerd/sinus rx   F/u in 6 weeks    Each maintenance medication was reviewed in detail including emphasizing most importantly the difference between maintenance and prns and under what circumstances the prns are to be triggered using an action plan format where appropriate.  Total time for H and P, chart review, counseling,  directly observing portions of ambulatory 02 saturation study/ and generating customized AVS unique to this initial office visit / same day charting = 51 min

## 2020-06-08 ENCOUNTER — Encounter: Payer: Self-pay | Admitting: *Deleted

## 2020-06-09 DIAGNOSIS — Z6830 Body mass index (BMI) 30.0-30.9, adult: Secondary | ICD-10-CM | POA: Diagnosis not present

## 2020-06-09 DIAGNOSIS — M1991 Primary osteoarthritis, unspecified site: Secondary | ICD-10-CM | POA: Diagnosis not present

## 2020-06-09 DIAGNOSIS — E6609 Other obesity due to excess calories: Secondary | ICD-10-CM | POA: Diagnosis not present

## 2020-06-09 DIAGNOSIS — M2550 Pain in unspecified joint: Secondary | ICD-10-CM | POA: Diagnosis not present

## 2020-06-13 DIAGNOSIS — Z7901 Long term (current) use of anticoagulants: Secondary | ICD-10-CM | POA: Diagnosis not present

## 2020-06-16 ENCOUNTER — Telehealth (HOSPITAL_COMMUNITY): Payer: Self-pay | Admitting: *Deleted

## 2020-06-16 DIAGNOSIS — G4730 Sleep apnea, unspecified: Secondary | ICD-10-CM | POA: Diagnosis not present

## 2020-06-16 DIAGNOSIS — R0683 Snoring: Secondary | ICD-10-CM | POA: Diagnosis not present

## 2020-06-16 NOTE — Telephone Encounter (Signed)
Agree w/ having PCP evaluate. They can check at [facility] that will check for UTI.

## 2020-06-16 NOTE — Telephone Encounter (Signed)
Pt called stating last night he urinated bright red blood. Today the blood has cleared up. Pt will see his pcp tomorrow to get tested for a UTI. Pt thinks this may be a side effect from jardiance and asked that I forward to a provider.  Routed to [facility] for advice

## 2020-06-17 DIAGNOSIS — Z7901 Long term (current) use of anticoagulants: Secondary | ICD-10-CM | POA: Diagnosis not present

## 2020-06-17 DIAGNOSIS — Z6830 Body mass index (BMI) 30.0-30.9, adult: Secondary | ICD-10-CM | POA: Diagnosis not present

## 2020-06-17 DIAGNOSIS — E6609 Other obesity due to excess calories: Secondary | ICD-10-CM | POA: Diagnosis not present

## 2020-06-17 DIAGNOSIS — R310 Gross hematuria: Secondary | ICD-10-CM | POA: Diagnosis not present

## 2020-06-21 ENCOUNTER — Ambulatory Visit: Payer: Non-veteran care | Admitting: Cardiology

## 2020-06-24 DIAGNOSIS — Z7901 Long term (current) use of anticoagulants: Secondary | ICD-10-CM | POA: Diagnosis not present

## 2020-06-27 ENCOUNTER — Other Ambulatory Visit: Payer: Self-pay | Admitting: *Deleted

## 2020-06-27 ENCOUNTER — Other Ambulatory Visit (HOSPITAL_COMMUNITY): Payer: Self-pay

## 2020-06-27 ENCOUNTER — Encounter: Payer: Self-pay | Admitting: Internal Medicine

## 2020-06-27 DIAGNOSIS — J9601 Acute respiratory failure with hypoxia: Secondary | ICD-10-CM | POA: Diagnosis not present

## 2020-06-27 DIAGNOSIS — R0600 Dyspnea, unspecified: Secondary | ICD-10-CM

## 2020-06-27 DIAGNOSIS — G4734 Idiopathic sleep related nonobstructive alveolar hypoventilation: Secondary | ICD-10-CM | POA: Insufficient documentation

## 2020-06-27 DIAGNOSIS — R0609 Other forms of dyspnea: Secondary | ICD-10-CM

## 2020-06-27 DIAGNOSIS — I272 Pulmonary hypertension, unspecified: Secondary | ICD-10-CM | POA: Diagnosis not present

## 2020-06-27 MED ORDER — EMPAGLIFLOZIN 10 MG PO TABS: 10.0000 mg | ORAL_TABLET | Freq: Every day | ORAL | 3 refills | Status: DC

## 2020-06-28 DIAGNOSIS — Z7901 Long term (current) use of anticoagulants: Secondary | ICD-10-CM | POA: Diagnosis not present

## 2020-06-29 DIAGNOSIS — N182 Chronic kidney disease, stage 2 (mild): Secondary | ICD-10-CM | POA: Diagnosis not present

## 2020-06-29 DIAGNOSIS — E1122 Type 2 diabetes mellitus with diabetic chronic kidney disease: Secondary | ICD-10-CM | POA: Diagnosis not present

## 2020-06-29 DIAGNOSIS — I129 Hypertensive chronic kidney disease with stage 1 through stage 4 chronic kidney disease, or unspecified chronic kidney disease: Secondary | ICD-10-CM | POA: Diagnosis not present

## 2020-06-30 ENCOUNTER — Encounter: Payer: Self-pay | Admitting: Internal Medicine

## 2020-06-30 DIAGNOSIS — R0683 Snoring: Secondary | ICD-10-CM | POA: Diagnosis not present

## 2020-06-30 DIAGNOSIS — G4730 Sleep apnea, unspecified: Secondary | ICD-10-CM | POA: Diagnosis not present

## 2020-07-04 DIAGNOSIS — Z7901 Long term (current) use of anticoagulants: Secondary | ICD-10-CM | POA: Diagnosis not present

## 2020-07-04 DIAGNOSIS — E118 Type 2 diabetes mellitus with unspecified complications: Secondary | ICD-10-CM | POA: Diagnosis not present

## 2020-07-04 DIAGNOSIS — C61 Malignant neoplasm of prostate: Secondary | ICD-10-CM | POA: Diagnosis not present

## 2020-07-04 DIAGNOSIS — I4891 Unspecified atrial fibrillation: Secondary | ICD-10-CM | POA: Diagnosis not present

## 2020-07-08 DIAGNOSIS — Z7901 Long term (current) use of anticoagulants: Secondary | ICD-10-CM | POA: Diagnosis not present

## 2020-07-13 ENCOUNTER — Telehealth: Payer: Self-pay | Admitting: *Deleted

## 2020-07-13 NOTE — Telephone Encounter (Signed)
Troy [name], CMA    07/13/20 3:52 PM Note Received status of ONO on RA done by [employer] 06/30/20  Study is pos for desat   Dr Melvyn Novas is out of the office and will need to forward to provider of the day to review results and advise on recommendations. Thank you, Beth!            Troy Ehrich, NP to [facility] Triage Pool     07/13/20 4:43 PM Note 06/30/20 ONO on room air showed patient spent 5 hours with SpO2 <88%. SpO2 low 76%, basal 88%. Please place an order for 2L nocturnal oxygen.      I have called the pt and he is aware of results.  He stated that he sleeps just fine and he does not understand why he needs to use this but if MW said he needs to use it he will and he will discuss with him at his next OV on 04/20.

## 2020-07-15 DIAGNOSIS — Z7901 Long term (current) use of anticoagulants: Secondary | ICD-10-CM | POA: Diagnosis not present

## 2020-07-20 ENCOUNTER — Other Ambulatory Visit: Payer: Self-pay

## 2020-07-20 ENCOUNTER — Encounter: Payer: Self-pay | Admitting: Internal Medicine

## 2020-07-20 ENCOUNTER — Ambulatory Visit: Payer: Medicare HMO | Admitting: Internal Medicine

## 2020-07-20 DIAGNOSIS — R0600 Dyspnea, unspecified: Secondary | ICD-10-CM | POA: Diagnosis not present

## 2020-07-20 DIAGNOSIS — G4734 Idiopathic sleep related nonobstructive alveolar hypoventilation: Secondary | ICD-10-CM

## 2020-07-20 DIAGNOSIS — R0609 Other forms of dyspnea: Secondary | ICD-10-CM

## 2020-07-20 NOTE — Progress Notes (Signed)
Troy [name], male    DOB: 01/24/1941,     MRN: [id number]   Brief patient profile:  38 yowm with known IHD quit smoking 04/2018 with onset around 2010 dysphagia and recurrent aspiration / pneumonia eval by Benjamine Mola with dx of paralyzed R VC last eval 04/12/20 doing "ok" per [facility] clinic referred to pulmonary clinic in [place]  06/07/2020 by Dr   Marigene Ehlers for recurrent asp and mild/mod Pulmonary hypertension     History of Present Illness  06/07/2020  Pulmonary/ 1st office eval/ Gianlucca Szymborski / [facility] Office  Chief Complaint  Patient presents with  . Pulmonary Consult    Referred by Dr Loralie Champagne.  Pt c/o trouble with his breathing since Dec 2021. He states has hx of recurrent PNA and aspiration. He gets winded "walking a long distance". He also c/o cough with white sputum. He has albuterol inhaler and neb and has not been using these.   Dyspnea:  Has not done much since last admit   using walker at home and able to do [employer] shopping leaning on cart  Cough: feels something hanging in throat, worse with swallowing food/ worse in am's assoc with sense of pnds  Keeps a can by bed > white mucus  Sleep: flat bed / one pillow on side  SABA use: not really feeling like he needs it now but has duoneb and hfa  02 stopped 3 weeks prior to OV  And no worse off it "can I turn it back in"  rec Try albuterol (the puffer one day, the nebulizer the next)  15 min before an activity that you know would make you short of breath  Change the way you take your acid pills:  Pantoprazole (protonix) 40 mg   Take  30-60 min before first meal of the day and Pepcid (famotidine)  20 mg one after the last meal until return to office - this is the best way to tell whether stomach acid is contributing to your problem.   GERD diet  Discuss any sinus problems with your  ENT (ENT is a sinus doctor ) Please schedule a follow up office visit in 6 weeks, call sooner if needed    07/20/2020  f/u ov/[facility]/Shemeka Wardle re:   Chief Complaint  Patient presents with  . Follow-up    No complaints currently  Dyspnea: no change doe using saba first  Cough: sense of pnds > white mucus  Sleeping: flat bed one pillow SABA use: not now  02: not using  Covid status: x 2  Lung cancer screening: n/a    No obvious day to day or daytime variability or assoc excess/ purulent sputum or mucus plugs or hemoptysis or cp or chest tightness, subjective wheeze or overt   hb symptoms.   Sleeping  without nocturnal  or early am exacerbation  of respiratory  c/o's or need for noct saba. Also denies any obvious fluctuation of symptoms with weather or environmental changes or other aggravating or alleviating factors except as outlined above   No unusual exposure hx or h/o childhood pna/ asthma or knowledge of premature birth.  Current Allergies, Complete Past Medical History, Past Surgical History, Family History, and Social History were reviewed in [employer] record.  ROS  The following are not active complaints unless bolded Hoarseness, sore throat, dysphagia, dental problems, itching, sneezing,  nasal congestion or discharge of excess mucus or purulent secretions, ear ache,   fever, chills, sweats, unintended wt loss or  wt gain, classically pleuritic or exertional cp,  orthopnea pnd or arm/hand swelling  or leg swelling, presyncope, palpitations, abdominal pain, anorexia, nausea, vomiting, diarrhea  or change in bowel habits or change in bladder habits, change in stools or change in urine, dysuria, hematuria,  rash, arthralgias, visual complaints, headache, numbness, weakness or ataxia or problems with walking or coordination,  change in mood or  memory.        Current Meds  Medication Sig  . acetaminophen (TYLENOL) 325 MG tablet Take 2 tablets (650 mg total) by mouth every 6 (six) hours as needed for mild pain, fever or headache (or Fever >/= 101).  [name] albuterol (VENTOLIN HFA) 108 (90 Base) MCG/ACT inhaler  Inhale 2 puffs into the lungs every 6 (six) hours as needed for wheezing or shortness of breath.  [name] amLODipine (NORVASC) 5 MG tablet Take 1 tablet (5 mg total) by mouth daily. For BP  . aspirin 81 MG EC tablet Take 1 tablet (81 mg total) by mouth daily with breakfast.  . atorvastatin (LIPITOR) 40 MG tablet Take 1 tablet (40 mg total) by mouth daily.  . empagliflozin (JARDIANCE) 10 MG TABS tablet Take 1 tablet (10 mg total) by mouth at bedtime.  . famotidine (PEPCID) 20 MG tablet One after supper  . HYDROcodone-acetaminophen (NORCO/VICODIN) 5-325 MG tablet Take 1 tablet by mouth daily as needed for moderate pain.  [name] ipratropium (ATROVENT) 0.03 % nasal spray Place 2 sprays into both nostrils 2 (two) times daily.  [name] ipratropium-albuterol (DUONEB) 0.5-2.5 (3) MG/3ML SOLN Take 3 mLs by nebulization every 4 (four) hours as needed.  . isosorbide mononitrate (IMDUR) 60 MG 24 hr tablet Take 2 tablets (120 mg total) by mouth every evening.  [name] losartan (COZAAR) 25 MG tablet Take 1 tablet (25 mg total) by mouth daily.  . metFORMIN (GLUCOPHAGE) 1000 MG tablet Take 1 tablet (1,000 mg total) by mouth 2 (two) times daily with a meal.  . metoprolol succinate (TOPROL XL) 25 MG 24 hr tablet Take 1 tablet (25 mg total) by mouth daily.  . nitroGLYCERIN (NITROSTAT) 0.4 MG SL tablet Place 1 tablet (0.4 mg total) under the tongue every 5 (five) minutes x 3 doses as needed for chest pain.  . OXYGEN Inhale into the lungs as needed. 3 liters n/c  . pantoprazole (PROTONIX) 40 MG tablet Take 1 tablet (40 mg total) by mouth daily. Take 30-60 min before first meal of the day  . potassium chloride (KLOR-CON) 10 MEQ tablet Take 1 tablet (10 mEq total) by mouth daily. Only take while taking Torsemide  . torsemide (DEMADEX) 10 MG tablet Take 1 tablet (10 mg total) by mouth every other day. Take 20 mg for weight gain over 3 lbs in 24 hours  . warfarin (COUMADIN) 2 MG tablet Take 2 mg by mouth every evening. MANAGED BY PMD               Past Medical History:  Diagnosis Date  . AAA (abdominal aortic aneurysm) ([place])    Followed by Dr. Curt Jews  . Arthritis   . CAD (coronary artery disease)    a. CABG 2000.  [name] [facility])    Prostate:  Radiation Tx  . Carotid artery disease ([place])   . Chest pain    precordial. mild chronic .[name]... nonischemic  . CHF (congestive heart failure) ([place])   . Chronic edema   . Coronary artery disease    a. Nuclear, January, 2008, no ischemia b. Cath 08/2012- 1/4  patent grafts, RCA CTO, no flow-limiting disease, medically managed  . Diabetes mellitus without complication ([place])   . Dizziness 02/2011  . Dyslipidemia   . GERD (gastroesophageal reflux disease)    TAKES TUMS & ROLAIDS AS NEEDED  . Hx of CABG 2000  . Hypertension   . Myocardial infarction ([place]) 1999  . Neck pain 02/2011  . Pneumonia   . Pulmonary hypertension ([place])   . Renal artery stenosis ([facility])    50-70%  . S/P femoropopliteal bypass surgery    Dr. Donnetta Hutching  . Sinus bradycardia    Asymptomatic      Objective:     Wt Readings from Last 3 Encounters:  07/20/20 205 lb 3.2 oz (93.1 kg)  06/07/20 205 lb 3.2 oz (93.1 kg)  05/25/20 205 lb (93 kg)      Vital signs reviewed  07/20/2020  - Note at rest 02 sats  97% on RA   General appearance:    amb wm nad    HEENT : pt wearing mask not removed for exam due to covid -19 concerns.    NECK :  without JVD/Nodes/TM/ nl carotid upstrokes bilaterally   LUNGS: no acc muscle use,  Nl contour chest which is clear to A and P bilaterally without cough on insp or exp maneuvers   CV:  RRR  no s3 or murmur or increase in P2, and  Trace to 1+  pitting L>R despite elastic hose  ABD:  soft and nontender with nl inspiratory excursion in the supine position. No bruits or organomegaly appreciated, bowel sounds nl  MS:  Nl gait/ ext warm without deformities, calf tenderness, cyanosis or clubbing No obvious joint restrictions   SKIN: warm and dry without lesions    NEURO:   alert, approp, nl sensorium with  no motor or cerebellar deficits apparent.         .     Assessment

## 2020-07-20 NOTE — Patient Instructions (Addendum)
You need to wear 2lpm every night to help your heart  We need to  Be sure 2lpm is enough so you'll need to repeat the 02 sats on oxygen.   We will see if we can change from [facility] to [place] but there are rules related to 02 equipment that everyone has to follow.  Only use your albuterol as a rescue medication to be used if you can't catch your breath by resting or doing a relaxed purse lip breathing pattern.  - The less you use it, the better it will work when you need it. - Ok to use up to 2 puffs  every 4 hours if you must but call for immediate appointment if use goes up over your usual need - Don't leave home without it !!  (think of it like the spare tire for your car)    Please schedule a follow up visit in 6  months but call sooner if needed

## 2020-07-21 ENCOUNTER — Encounter: Payer: Self-pay | Admitting: Internal Medicine

## 2020-07-21 NOTE — Assessment & Plan Note (Signed)
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 - RHC  04/11/20 RA mean 6 RV 64/6 PA 65/18, mean 35 PCWP mean 14 Ao 112/45 Oxygen saturations: PA 62% AO 100% Cardiac Output (Fick) 5.71  Cardiac Index (Fick) 2.71 PVR 3.7 WU - PFT's  05/05/20  FEV1 2.87 (94 % ) ratio 0.76  p 0 % improvement from saba p o prior to study with DLCO  13.20 (53%) corrects to 2.46 (62 %)  for alv volume and FV curve   -  06/07/2020   Walked RA  approx   300 ft  @ slow to moderate pace  stopped due  to hip pain/no SOB with sats 93% at end    - ONO RA 06/16/20  desats x 4 h on RA > rec 2lpm and repeat (see noct hypoxemia)   C/w WHO III so rx is be sure 02 sats stay > 90%/ advice

## 2020-07-21 NOTE — Assessment & Plan Note (Signed)
ONO RA 06/16/20  desats x 4 h on RA > rec 2lpm and repeat.  - 07/20/2020 not using 02 > rec take it and recheck ONO on 2lpm to assure adequate noct sats          Each maintenance medication was reviewed in detail including emphasizing most importantly the difference between maintenance and prns and under what circumstances the prns are to be triggered using an action plan format where appropriate.  Total time for H and P, chart review, counseling, reviewing 02  device(s) and generating customized AVS unique to this office visit / same day charting = 22 min

## 2020-07-28 DIAGNOSIS — I272 Pulmonary hypertension, unspecified: Secondary | ICD-10-CM | POA: Diagnosis not present

## 2020-07-28 DIAGNOSIS — J9601 Acute respiratory failure with hypoxia: Secondary | ICD-10-CM | POA: Diagnosis not present

## 2020-07-28 DIAGNOSIS — Z7901 Long term (current) use of anticoagulants: Secondary | ICD-10-CM | POA: Diagnosis not present

## 2020-07-29 DIAGNOSIS — G4734 Idiopathic sleep related nonobstructive alveolar hypoventilation: Secondary | ICD-10-CM | POA: Diagnosis not present

## 2020-07-30 DIAGNOSIS — E1122 Type 2 diabetes mellitus with diabetic chronic kidney disease: Secondary | ICD-10-CM | POA: Diagnosis not present

## 2020-07-30 DIAGNOSIS — N182 Chronic kidney disease, stage 2 (mild): Secondary | ICD-10-CM | POA: Diagnosis not present

## 2020-07-30 DIAGNOSIS — I129 Hypertensive chronic kidney disease with stage 1 through stage 4 chronic kidney disease, or unspecified chronic kidney disease: Secondary | ICD-10-CM | POA: Diagnosis not present

## 2020-08-02 DIAGNOSIS — Z6830 Body mass index (BMI) 30.0-30.9, adult: Secondary | ICD-10-CM | POA: Diagnosis not present

## 2020-08-02 DIAGNOSIS — L0291 Cutaneous abscess, unspecified: Secondary | ICD-10-CM | POA: Diagnosis not present

## 2020-08-02 DIAGNOSIS — L03114 Cellulitis of left upper limb: Secondary | ICD-10-CM | POA: Diagnosis not present

## 2020-08-02 DIAGNOSIS — E6609 Other obesity due to excess calories: Secondary | ICD-10-CM | POA: Diagnosis not present

## 2020-08-04 ENCOUNTER — Telehealth (HOSPITAL_COMMUNITY): Payer: Self-pay | Admitting: *Deleted

## 2020-08-04 NOTE — Telephone Encounter (Signed)
Sounds like he has a Jardiance-related yeast infection.  Let's have him stop it for now and followup with PCP for treatment.

## 2020-08-04 NOTE — Telephone Encounter (Signed)
Pt called stating Jardiance caused a "skin infection" on his hand and penis. Pt said his foreskin is red and sore. Pt asked what can he do to treat this and does he need to stop medication.   Routed to [place]

## 2020-08-08 NOTE — Telephone Encounter (Signed)
Pt aware and agreeable with plan.  

## 2020-08-15 DIAGNOSIS — Z7901 Long term (current) use of anticoagulants: Secondary | ICD-10-CM | POA: Diagnosis not present

## 2020-08-23 DIAGNOSIS — Z7901 Long term (current) use of anticoagulants: Secondary | ICD-10-CM | POA: Diagnosis not present

## 2020-08-28 DIAGNOSIS — G4734 Idiopathic sleep related nonobstructive alveolar hypoventilation: Secondary | ICD-10-CM | POA: Diagnosis not present

## 2020-08-30 ENCOUNTER — Encounter (HOSPITAL_COMMUNITY): Payer: Self-pay | Admitting: Cardiology

## 2020-08-30 ENCOUNTER — Ambulatory Visit (HOSPITAL_COMMUNITY)
Admission: RE | Admit: 2020-08-30 | Discharge: 2020-08-30 | Disposition: A | Discharge status: Home / Self Care | Payer: Medicare HMO | Source: Ambulatory Visit | Attending: Cardiology | Admitting: Cardiology

## 2020-08-30 ENCOUNTER — Other Ambulatory Visit: Payer: Self-pay

## 2020-08-30 VITALS — BP 118/68 | HR 70 | Wt 201.4 lb

## 2020-08-30 DIAGNOSIS — R0789 Other chest pain: Secondary | ICD-10-CM | POA: Insufficient documentation

## 2020-08-30 DIAGNOSIS — I4821 Permanent atrial fibrillation: Secondary | ICD-10-CM | POA: Diagnosis not present

## 2020-08-30 DIAGNOSIS — I2510 Atherosclerotic heart disease of native coronary artery without angina pectoris: Secondary | ICD-10-CM

## 2020-08-30 DIAGNOSIS — J9611 Chronic respiratory failure with hypoxia: Secondary | ICD-10-CM | POA: Insufficient documentation

## 2020-08-30 DIAGNOSIS — I25119 Atherosclerotic heart disease of native coronary artery with unspecified angina pectoris: Secondary | ICD-10-CM | POA: Insufficient documentation

## 2020-08-30 DIAGNOSIS — I5032 Chronic diastolic (congestive) heart failure: Secondary | ICD-10-CM | POA: Insufficient documentation

## 2020-08-30 DIAGNOSIS — R590 Localized enlarged lymph nodes: Secondary | ICD-10-CM | POA: Insufficient documentation

## 2020-08-30 DIAGNOSIS — Z7982 Long term (current) use of aspirin: Secondary | ICD-10-CM | POA: Insufficient documentation

## 2020-08-30 DIAGNOSIS — Z8249 Family history of ischemic heart disease and other diseases of the circulatory system: Secondary | ICD-10-CM | POA: Insufficient documentation

## 2020-08-30 DIAGNOSIS — Z87891 Personal history of nicotine dependence: Secondary | ICD-10-CM | POA: Insufficient documentation

## 2020-08-30 DIAGNOSIS — I110 Hypertensive heart disease with heart failure: Secondary | ICD-10-CM | POA: Diagnosis not present

## 2020-08-30 DIAGNOSIS — I272 Pulmonary hypertension, unspecified: Secondary | ICD-10-CM | POA: Diagnosis not present

## 2020-08-30 DIAGNOSIS — Z7901 Long term (current) use of anticoagulants: Secondary | ICD-10-CM | POA: Insufficient documentation

## 2020-08-30 DIAGNOSIS — Z79899 Other long term (current) drug therapy: Secondary | ICD-10-CM | POA: Diagnosis not present

## 2020-08-30 HISTORY — DX: Unspecified fall, initial encounter: W19.XXXA

## 2020-08-30 LAB — BASIC METABOLIC PANEL
Anion gap: 9 (ref 5–15)
BUN: 14 mg/dL (ref 8–23)
CO2: 29 mmol/L (ref 22–32)
Calcium: 8.9 mg/dL (ref 8.9–10.3)
Chloride: 101 mmol/L (ref 98–111)
Creatinine, Ser: 0.77 mg/dL (ref 0.61–1.24)
GFR, Estimated: >60 mL/min (ref >60)
Glucose, Bld: 112 mg/dL — ABNORMAL HIGH (ref 70–99)
Potassium: 3.9 mmol/L (ref 3.5–5.1)
Sodium: 139 mmol/L (ref 135–145)

## 2020-08-30 LAB — BRAIN NATRIURETIC PEPTIDE: B Natriuretic Peptide: 139.2 pg/mL — ABNORMAL HIGH (ref 0.0–100.0)

## 2020-08-30 NOTE — Patient Instructions (Signed)
Labs done today. We will contact you only if your labs are abnormal.  No medication changes were made. Please continue all current medications as prescribed.  Non-Cardiac CT scanning, (CAT scanning), is a noninvasive, special x-ray that produces cross-sectional images of the body using x-rays and a computer. CT scans help physicians diagnose and treat medical conditions. For some CT exams, a contrast material is used to enhance visibility in the area of the body being studied. CT scans provide greater clarity and reveal more details than regular x-ray exams. This has to be approved through your insurance company prior to scheduling, once approved we will contact you to schedule an appointment.  Your provider has requested that you have a VQ scan done. This has to be approved through your insurance company prior to scheduling, once approved we will contact you to schedule an appointment.   Your physician recommends that you schedule a follow-up appointment in: 4 months with our [facility] here in our office.   If you have any questions or concerns before your next appointment please send [place] a message through [place] or call our office at [phone].    TO LEAVE A MESSAGE FOR THE NURSE SELECT OPTION 2, PLEASE LEAVE A MESSAGE INCLUDING: . YOUR NAME . DATE OF BIRTH . CALL BACK NUMBER . REASON FOR CALL**this is important as we prioritize the call backs  YOU WILL RECEIVE A CALL BACK THE SAME DAY AS LONG AS YOU CALL BEFORE 4:00 PM   Do the following things EVERYDAY: 1) Weigh yourself in the morning before breakfast. Write it down and keep it in a log. 2) Take your medicines as prescribed 3) Eat low salt foods--Limit salt (sodium) to 2000 mg per day.  4) Stay as active as you can everyday 5) Limit all fluids for the day to less than 2 liters   At the [facility], you and your health needs are our priority. As part of our continuing mission to provide you with exceptional  heart care, we have created designated Provider Care Teams. These Care Teams include your primary Cardiologist (physician) and Advanced Practice Providers (APPs- Physician Assistants and Nurse Practitioners) who all work together to provide you with the care you need, when you need it.   You may see any of the following providers on your designated Care Team at your next follow up: [name] Dr Glori Bickers . Dr Loralie Champagne . Darrick Grinder, NP . Lyda Jester, PA . Audry Riles, PharmD   Please be sure to bring in all your medications bottles to every appointment.

## 2020-08-30 NOTE — Progress Notes (Signed)
PCP: Sharilyn Sites, MD HF Cardiology: Dr. Aundra Dubin  80 y.o. with history of permanent atrial fibrillation, CAD s/p CABG 2000, PAD, chronic diastolic CHF was referred to CHF clinic by Melina Copa, PA, for evaluation of CHF and pulmonary hypertension.  Patient had CABG in 2000, last cath was in 2014 showing occluded SVG-OM, occluded SVG-PDA, occluded RCA, and atretic LIMA.  SVG-D was patent and native LAD was patent.  Patient had bilateral fem-pop bypasses, peripheral dopplers in 7/21 showed that the right fem-pop was occluded.  He is in permanent atrial fibrillation on warfarin. Echo in 10/21 showed normal LV systolic function with moderately dilated/mildly dysfunctional RV and PASP 75 mmHg.  In 12/21, he was admitted with PNA and treated with antibiotics.  CTA chest showed no PE, RML PNA, and bilateral hilar + mediastinal lymphadenopathy.  He was sent home from this admission on home oxygen.   In 1/22, RHC/LHC was done.  This showed patent LIMA-LAD and SVG-D, occluded SVG-OM and occluded SVG-RCA, 95% calcified proximal RCA stenosis, totally occluded PLV with collaterals (known from prior).  There was moderate pulmonary hypertension with optimized left and right heart filling pressures.  I reviewed the cath with interventional cardiology, would require atherectomy to treat the RCA.  We decided to manage medically initially.   He stopped Jardiance due to penile yeast infection.   Patient returns for followup of CHF.  He feels good overall.  Weight down 4 lbs.  No significant claudication.  Rare atypical chest pain, not exertional.  Using oxygen at night.  No exertional dyspnea walking on flat ground. No orthopnea/PND.  Uses walker outside house for balance.  No lightheadedness.   Labs (12/21): K 3.8, creatinine 0.83, hgb 9.5 Labs (1/22): K 3.8, creatinine 1.22, LDL 49 Labs (2/22): K 3.6, creatinine 0.86  PMH: 1. Atrial fibrillation: Permanent.  2. CAD: CABG 2000.  - LHC (2014) with totally occluded RCA,  totally occluded SVG-RCA, totally occluded SVG-OM, atretic LIMA, patent SVG-D.   - LHC (1/22): patent LIMA-LAD and SVG-D, occluded SVG-OM and occluded SVG-RCA, 60-70% calcified ostial RCA stenosis and 95% calcified proximal RCA stenosis, totally occluded PLV with collaterals (known from prior). 3. HTN 4. PAD: s/p bilateral fem-pop bypasses.   - Peripheral arterial dopplers (7/21): Occluded right fem-pop, patent left fem-pop.  5. Type 2 diabetes 6. Prostate cancer: Treated with radiation. 7. PVCs 8. History of renal artery stenosis.  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  10. Chronic diastolic CHF: Echo (32/35) with EF 60-65%, mildly decreased RV systolic function with moderate RV enlargement, severe biatrial enlargement, PASP 75 mmHg, dilated IVC.  - RHC (1/22): mean RA 6, PA 65/18 mean 35, mean PCWP 14, CI 2.71, PVR 3.7 WU.  11. 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   SH: Lives in [place] with wife.  Retired.  Remote smoker (>20 years ago). No ETOH.   Family History  Problem Relation Age of Onset  . Deep vein thrombosis Father   . Lung cancer Sister   . Diabetes Sister   . Heart disease Sister        After age 5  . Hyperlipidemia Sister   . Hypertension Sister   . Lung cancer Sister   . Breast cancer Sister   . Hypertension Mother   . Diabetes Sister   . Coronary artery disease Other        family hx of  . Cancer Brother        "crab cancer"  . Heart  disease Brother   . Heart attack Brother   . Heart attack Daughter   . Colon cancer Neg Hx   . Stroke Neg Hx    ROS: All systems reviewed and negative except as per HPI.   Current Outpatient Medications  Medication Sig Dispense Refill  . acetaminophen (TYLENOL) 325 MG tablet Take 2 tablets (650 mg total) by mouth every 6 (six) hours as needed for mild pain, fever or headache (or Fever >/= 101). 12 tablet 0  . albuterol (VENTOLIN HFA) 108 (90 Base) MCG/ACT inhaler Inhale 2 puffs into the lungs every 6 (six)  hours as needed for wheezing or shortness of breath. 1 each 2  . amLODipine (NORVASC) 5 MG tablet Take 1 tablet (5 mg total) by mouth daily. For BP 30 tablet 4  . aspirin 81 MG EC tablet Take 1 tablet (81 mg total) by mouth daily with breakfast. 30 tablet 3  . atorvastatin (LIPITOR) 40 MG tablet Take 1 tablet (40 mg total) by mouth daily. 90 tablet 1  . famotidine (PEPCID) 20 MG tablet One after supper 30 tablet 11  . HYDROcodone-acetaminophen (NORCO/VICODIN) 5-325 MG tablet Take 1 tablet by mouth daily as needed for moderate pain.    [name] ipratropium (ATROVENT) 0.03 % nasal spray Place 2 sprays into both nostrils 2 (two) times daily.    [name] ipratropium-albuterol (DUONEB) 0.5-2.5 (3) MG/3ML SOLN Take 3 mLs by nebulization every 4 (four) hours as needed. 360 mL 1  . isosorbide mononitrate (IMDUR) 60 MG 24 hr tablet Take 2 tablets (120 mg total) by mouth every evening. 90 tablet 6  . losartan (COZAAR) 25 MG tablet Take 1 tablet (25 mg total) by mouth daily. 30 tablet 4  . metFORMIN (GLUCOPHAGE) 1000 MG tablet Take 1,000 mg by mouth daily with breakfast.    . metoprolol succinate (TOPROL XL) 25 MG 24 hr tablet Take 1 tablet (25 mg total) by mouth daily. 90 tablet 3  . nitroGLYCERIN (NITROSTAT) 0.4 MG SL tablet Place 1 tablet (0.4 mg total) under the tongue every 5 (five) minutes x 3 doses as needed for chest pain. 25 tablet 3  . OXYGEN Inhale into the lungs as needed. 3 liters n/c    . pantoprazole (PROTONIX) 40 MG tablet Take 1 tablet (40 mg total) by mouth daily. Take 30-60 min before first meal of the day    . potassium chloride (KLOR-CON) 10 MEQ tablet Take 1 tablet (10 mEq total) by mouth daily. Only take while taking Torsemide 30 tablet 3  . torsemide (DEMADEX) 10 MG tablet Take 1 tablet (10 mg total) by mouth every other day. Take 20 mg for weight gain over 3 lbs in 24 hours 30 tablet 3  . warfarin (COUMADIN) 2 MG tablet Take 2 mg by mouth every evening. MANAGED BY PMD     No current  facility-administered medications for this encounter.   BP 118/68   Pulse 70   Wt 91.4 kg (201 lb 6.4 oz)   SpO2 98%   BMI 28.90 kg/m  General: NAD Neck: No JVD, no thyromegaly or thyroid nodule.  Lungs: Clear to auscultation bilaterally with normal respiratory effort. CV: Nondisplaced PMI.  Heart regular S1/S2, no S3/S4, no murmur.  Trace ankle edema.  No carotid bruit.  Normal pedal pulses.  Abdomen: Soft, nontender, no hepatosplenomegaly, no distention.  Skin: Intact without lesions or rashes.  Neurologic: Alert and oriented x 3.  Psych: Normal affect. Extremities: No clubbing or cyanosis.  HEENT: Normal.  Assessment/Plan: 1. Chronic diastolic CHF: Echo in 17/00 with EF 60-65%, mildly decreased RV systolic function with moderate RV enlargement, severe biatrial enlargement, PASP 75 mmHg, dilated IVC. After increasing diuretics, RHC in 1/22 showed normal filling pressures with moderate pulmonary hypertension. Weight is down 4 lbs and he does not look volume overloaded today. NYHA class II.  Jardiance stopped due to yeast infection.  - Continue torsemide 10 mg every other day.  BMET/BNP today.  2. 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  Moderate PH on RHC in 1/22.  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  Less likely group 1 PH.  No PE on recent CTA.  - Though CTA showed no PE, he should have V/Q scan for completeness (ordered).   3. Chronic hypoxemic respiratory failure: Remote smoker.  Hilar and mediastinal lymphadenopathy on recent CT, no reported ILD.  Recurrent aspiration PNA in setting of chronic right vocal cord paralysis.  Was on home oxygen but able to stop after more effective diuresis, now just uses oxygen at night.  - Will repeat noncontrast chest CT to followup on hilar and mediastinal lymphadenopathy. - Should follow with pulmonary.  4. CAD: s/p CABG in 2000.  Cath in 1/22 showed patent LIMA-LAD and SVG-D but occluded SVG-OM and  SVG-RCA.  There was 60-70% ostial RCA stenosis and 95% proximal RCA stenosis with heavy calcification.  Discussed with interventional, with normal EF plan was to proceed with initial medical management and proceed to PCI if chest pain worsened (PCI would require atherectomy). Minimal atypical chest pain recently.     - Continue ASA 81 and atorvastatin, good lipids in 1/22.  - Continue Imdur 120 mg daily and Toprol XL 25 mg daily for angina control.  5. Atrial fibrillation: Permanent.  - On warfarin.  - Continue Toprol XL 25 daily.  6. PAD: Occluded right fem-pop bypass.  No claudication.  - Needs followup with [facility].   Followup in 4 months with NP/PA.   Loralie Champagne 08/30/2020

## 2020-08-31 DIAGNOSIS — Z7901 Long term (current) use of anticoagulants: Secondary | ICD-10-CM | POA: Diagnosis not present

## 2020-09-15 DIAGNOSIS — Z7901 Long term (current) use of anticoagulants: Secondary | ICD-10-CM | POA: Diagnosis not present

## 2020-09-19 DIAGNOSIS — Z7901 Long term (current) use of anticoagulants: Secondary | ICD-10-CM | POA: Diagnosis not present

## 2020-09-28 DIAGNOSIS — G4734 Idiopathic sleep related nonobstructive alveolar hypoventilation: Secondary | ICD-10-CM | POA: Diagnosis not present

## 2020-09-29 DIAGNOSIS — Z7901 Long term (current) use of anticoagulants: Secondary | ICD-10-CM | POA: Diagnosis not present

## 2020-10-04 DIAGNOSIS — Z7901 Long term (current) use of anticoagulants: Secondary | ICD-10-CM | POA: Diagnosis not present

## 2020-10-05 ENCOUNTER — Encounter (HOSPITAL_COMMUNITY)
Admission: RE | Admit: 2020-10-05 | Discharge: 2020-10-05 | Disposition: A | Discharge status: Home / Self Care | Payer: Medicare HMO | Source: Ambulatory Visit | Attending: Cardiology | Admitting: Cardiology

## 2020-10-05 ENCOUNTER — Ambulatory Visit (HOSPITAL_COMMUNITY): Payer: Medicare HMO

## 2020-10-05 ENCOUNTER — Ambulatory Visit (HOSPITAL_COMMUNITY)
Admission: RE | Admit: 2020-10-05 | Discharge: 2020-10-05 | Disposition: A | Discharge status: Home / Self Care | Payer: Medicare HMO | Source: Ambulatory Visit | Attending: Cardiology | Admitting: Cardiology

## 2020-10-05 DIAGNOSIS — I272 Pulmonary hypertension, unspecified: Secondary | ICD-10-CM | POA: Insufficient documentation

## 2020-10-05 DIAGNOSIS — J811 Chronic pulmonary edema: Secondary | ICD-10-CM | POA: Diagnosis not present

## 2020-10-05 DIAGNOSIS — Z86718 Personal history of other venous thrombosis and embolism: Secondary | ICD-10-CM | POA: Diagnosis not present

## 2020-10-05 DIAGNOSIS — R0602 Shortness of breath: Secondary | ICD-10-CM | POA: Diagnosis not present

## 2020-10-05 DIAGNOSIS — I517 Cardiomegaly: Secondary | ICD-10-CM | POA: Diagnosis not present

## 2020-10-05 DIAGNOSIS — I10 Essential (primary) hypertension: Secondary | ICD-10-CM | POA: Diagnosis not present

## 2020-10-05 MED ORDER — TECHNETIUM TO 99M ALBUMIN AGGREGATED
4.0000 | Freq: Once | INTRAVENOUS | Status: AC | PRN
  Administered 2020-10-05: 12:00:00 4.1 via INTRAVENOUS

## 2020-10-10 DIAGNOSIS — L03116 Cellulitis of left lower limb: Secondary | ICD-10-CM | POA: Diagnosis not present

## 2020-10-10 DIAGNOSIS — Z6831 Body mass index (BMI) 31.0-31.9, adult: Secondary | ICD-10-CM | POA: Diagnosis not present

## 2020-10-10 DIAGNOSIS — S9032XA Contusion of left foot, initial encounter: Secondary | ICD-10-CM | POA: Diagnosis not present

## 2020-10-10 DIAGNOSIS — E6609 Other obesity due to excess calories: Secondary | ICD-10-CM | POA: Diagnosis not present

## 2020-10-11 ENCOUNTER — Encounter (HOSPITAL_COMMUNITY): Payer: Self-pay | Admitting: Physical Therapy

## 2020-10-11 ENCOUNTER — Other Ambulatory Visit: Payer: Self-pay

## 2020-10-11 ENCOUNTER — Ambulatory Visit (HOSPITAL_COMMUNITY): Payer: Medicare HMO | Attending: Family Medicine | Admitting: Physical Therapy

## 2020-10-11 DIAGNOSIS — L03116 Cellulitis of left lower limb: Secondary | ICD-10-CM | POA: Diagnosis not present

## 2020-10-11 DIAGNOSIS — E6609 Other obesity due to excess calories: Secondary | ICD-10-CM | POA: Diagnosis not present

## 2020-10-11 DIAGNOSIS — Z6831 Body mass index (BMI) 31.0-31.9, adult: Secondary | ICD-10-CM | POA: Diagnosis not present

## 2020-10-11 DIAGNOSIS — R2689 Other abnormalities of gait and mobility: Secondary | ICD-10-CM | POA: Diagnosis not present

## 2020-10-11 NOTE — Therapy (Signed)
[place], [place], [address] Phone: [phone]   Fax:  [phone]  Physical Therapy Evaluation  Patient Details  Name: MARLOWE LAWES MRN: [id number] Date of Birth: 01/25/1941 Referring Provider (PT): Sharilyn Sites MD   Encounter Date: 10/11/2020   PT End of Session - 10/11/20 1338     Visit Number 1    Number of Visits 1    Date for PT Re-Evaluation 10/11/20    Authorization Type [employer] (1 visit requested)    PT Start Time 1303    PT Stop Time 1330    PT Time Calculation (min) 27 min    Activity Tolerance Patient tolerated treatment well    Behavior During Therapy [facility] for tasks assessed/performed             Past Medical History:  Diagnosis Date   AAA (abdominal aortic aneurysm) ([place])    Followed by Dr. Sherren Mocha Early   Arthritis    CAD (coronary artery disease)    a. CABG 2000.   Cancer [facility])    Prostate:  Radiation Tx   Carotid artery disease ([place])    Chest pain    precordial. mild chronic .[name]... nonischemic   CHF (congestive heart failure) (HCC)    Chronic edema    Coronary artery disease    a. Nuclear, January, 2008, no ischemia b. Cath 08/2012- 1/4 patent grafts, RCA CTO, no flow-limiting disease, medically managed   Diabetes mellitus without complication ([place])    Dizziness 02/2011   Dyslipidemia    Fall    GERD (gastroesophageal reflux disease)    TAKES TUMS & ROLAIDS AS NEEDED   Hx of CABG 2000   Hypertension    Myocardial infarction ([place]) 1999   Neck pain 02/2011   Pneumonia    Pulmonary hypertension ([place])    Renal artery stenosis (HCC)    50-70%   S/P femoropopliteal bypass surgery    Dr. Donnetta Hutching   Sinus bradycardia    Asymptomatic    Past Surgical History:  Procedure Laterality Date   CARDIAC CATHETERIZATION  09/26/2012   1/4 patent bypass (occluded SVG-PDA, SVG-OM, LIMA-LAD), SVG-diagonal patent and fills the diagonal and LAD, distal RCA occlusion with left to right  collateralization, patent circumflex, LAD with no flow-limiting disease and antegrade flow competitively from SVG-diagonal; EF 60-65%   COLONOSCOPY  11/30/2009   JOA:[id number]. next TCS 11/2019   COLONOSCOPY WITH PROPOFOL N/A 12/18/2019   Procedure: COLONOSCOPY WITH PROPOFOL;  Surgeon: Harvel Quale, MD;  [facility];  Service: Gastroenterology;  Laterality: N/A;  1030   CORONARY ARTERY BYPASS GRAFT  2000   ESOPHAGOGASTRODUODENOSCOPY N/A 08/12/2013   NAT:[id number] peptic stricture with erosive refluxesophagitis - status post Maloney dilation. Hiatal hernia. Abnormalgastric mucosa. Deformity of the pyloric channel suggestive ofprior peptic ulcer disease. Duodenal bulbar diverticulum Statuspost gastric biopsy. h.pylori   LEFT HEART CATHETERIZATION WITH CORONARY/GRAFT ANGIOGRAM N/A 09/26/2012   Procedure: LEFT HEART CATHETERIZATION WITH Beatrix Fetters;  Surgeon: Burnell Blanks, MD;  Location: [facility] CATH LAB;  Service: Cardiovascular;  Laterality: N/A;   MALONEY DILATION N/A 08/12/2013   Procedure: Venia Minks DILATION;  Surgeon: Daneil Dolin, MD;  Location: [facility];  Service: Endoscopy;  Laterality: N/A;   POLYPECTOMY  12/18/2019   Procedure: POLYPECTOMY;  Surgeon: Harvel Quale, MD;  Location: [facility];  Service: Gastroenterology;;  ascending colon polyp    PR VEIN BYPASS GRAFT,AORTO-FEM-POP Right 07/19/1999   PR VEIN BYPASS GRAFT,AORTO-FEM-POP  Left 05/02/2006   RIGHT HEART CATH AND CORONARY/GRAFT ANGIOGRAPHY N/A 04/11/2020   Procedure: RIGHT HEART CATH AND CORONARY/GRAFT ANGIOGRAPHY;  Surgeon: Larey Dresser, MD;  Location: [place] CV LAB;  Service: Cardiovascular;  Laterality: N/A;   SAVORY DILATION N/A 08/12/2013   Procedure: SAVORY DILATION;  Surgeon: Daneil Dolin, MD;  Location[facility];  Service: Endoscopy;  Laterality: N/A;   WRIST SURGERY     cyst removal    There were no vitals filed for this visit.     Subjective Assessment - 10/11/20 1335     Subjective Patient is a 80 y.o. male who presents to physical therapy with referral for cellulitis of left lower limb. Patient states he dropped a shop vac on his foot last week and his foot hurts now with swelling and soreness. He has been using RW incase he has pain and to decrease pain with walking. He has been following up with MD all week regarding edema and erythema. Patient has been given antibiotics.    Limitations Standing;Walking;House hold activities    Currently in Pain? Yes    Pain Score 6     Pain Location Foot    Pain Orientation Left   dorsal   Pain Descriptors / Indicators Sore;Sharp    Pain Type Acute pain    Pain Onset In the past 7 days    Pain Frequency Constant    Aggravating Factors  walking    Pain Relieving Factors rest                [facility] PT Assessment - 10/11/20 0001       Assessment   Medical Diagnosis Cellulitis of L lower limb    Referring Provider (PT) Sharilyn Sites MD    Onset Date/Surgical Date 10/07/20      Precautions   Precautions Fall      Restrictions   Weight Bearing Restrictions No      Prior Function   Level of Independence Independent    Vocation Retired      Associate Professor   Overall Cognitive Status Within Functional Limits for tasks assessed      Observation/Other Assessments   Observations labored, antalgic gait with RW; edema, erythema, bruising in L distal extremity and toes. scab: approx. 0.5cm x 0.5cm dorsal surface of foot; Photo in media section    Focus on Therapeutic Outcomes (FOTO)  n/a      ROM / Strength   AROM / PROM / Strength AROM      AROM   AROM Assessment Site Ankle    Right/Left Ankle Left    Left Ankle Dorsiflexion 3    Left Ankle Plantar Flexion 40      Transfers   Comments labored, heavy UE use      Ambulation/Gait   Ambulation Distance (Feet) 120 Feet    Assistive device Rolling walker    Gait Comments decreased cadence with RW, labored                         Objective measurements completed on examination: See above findings.       [place] Adult PT Treatment/Exercise - 10/11/20 0001       Exercises   Exercises Ankle      Manual Therapy   Manual Therapy Edema management    Manual therapy comments completed independent from all other aspects of treatment    Edema Management gentle to dorsal surface of L foot  Ankle Exercises: Seated   Other Seated Ankle Exercises ankle pumps x 15, circles cw/ccw x 15 each, toe crunches x 15                    PT Education - 10/11/20 1300     Education Details Patient educated on exam findings, POC, scope of PT, HEP, returning to PT if needed    Person(s) Educated Patient    Methods Explanation;Handout    Comprehension Verbalized understanding              PT Short Term Goals - 10/11/20 1355       PT SHORT TERM GOAL #1   Title Patient will be educated on HEP    Time 1    Period Days    Status Achieved    Target Date 10/11/20                       Plan - 10/11/20 1345     Clinical Impression Statement Patient is a 80 y.o. male who presents to physical therapy with referral for cellulitis of left lower limb. Patient ambulating with antalgic gait with slightly unsteady cadence with RW. Patient with edema, erythema, and bruising in distal tibial, dorsal foot, and toe regions. Patient presents without open wound but does have a scab on dorsal surface of foot approximately 0.5 cm x 0.5 cm. Image uploaded in media section of chart. Patient demonstrating decreased balance, cadence, and L ankle/foot ROM due to injury but does not appear to need wound care at this time as patient closely following with MD regarding need for antibiotics. Patient completes seated exercises today and is educated on completing for HEP. Performed gentle edema massage to dorsal surface of foot and patient tolerates well. Patient educated on completing interventions at home  for self management of symptoms and is educated on obtaining a new referral should a new issue arise. Patient does not require additional PT services at this time.    Personal Factors and Comorbidities Age;Fitness;Comorbidity 3+    Comorbidities CHF, CAD, DM, hx prostate Cancer, Hx MI, HTN, PAD, Afib    Examination-Activity Limitations Locomotion Level;Transfers;Stand    Examination-Participation Restrictions Lawyer;Interpersonal Relationship;Laundry;Meal Prep;Cleaning;Community Activity    Stability/Clinical Decision Making Stable/Uncomplicated    Clinical Decision Making Low    Rehab Potential Good    PT Frequency One time visit    PT Treatment/Interventions ADLs/Self Care Home Management;Gait training;Stair training;Functional mobility training;Therapeutic activities;Therapeutic exercise;Balance training;DME Instruction;Patient/family education;Neuromuscular re-education;Manual techniques;Manual lymph drainage;Compression bandaging;Passive range of motion    PT Next Visit Plan n/a    PT Home Exercise Plan ankle pumps, circles, toe crunches    Consulted and Agree with Plan of Care Patient             Patient will benefit from skilled therapeutic intervention in order to improve the following deficits and impairments:  Abnormal gait, Difficulty walking, Pain, Decreased skin integrity, Decreased balance, Decreased mobility  Visit Diagnosis: Cellulitis of left lower limb  Other abnormalities of gait and mobility     Problem List Patient Active Problem List   Diagnosis Date Noted   Nocturnal hypoxemia 06/27/2020   DOE (dyspnea on exertion) 06/07/2020   Rt Sided Community acquired pneumonia 03/26/2020   Microcytic anemia 03/26/2020   --Severe pulmonary HTN, 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 ----Pulmonary HTN  03/26/2020   Sepsis due to Rt Sided Pneumonia 03/26/2020   Atrial fibrillation ([place]) 12/24/2014   Bowel habit changes 09/16/2014  Erosive esophagitis 02/11/2014   Abdominal  aneurysm without mention of rupture 07/28/2013   Peripheral vascular disease, unspecified ([place]) 07/28/2013   Esophageal dysphagia 07/21/2013   CAD (coronary artery disease)    Type 2 diabetes mellitus ([place]) 09/27/2012   Hypertension 09/27/2012   Atherosclerosis of native arteries of the extremities with intermittent claudication 02/12/2012   Limb swelling 02/12/2012   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

## 2020-10-12 ENCOUNTER — Encounter (HOSPITAL_COMMUNITY): Payer: Self-pay | Admitting: Cardiology

## 2020-10-12 DIAGNOSIS — Z7901 Long term (current) use of anticoagulants: Secondary | ICD-10-CM | POA: Diagnosis not present

## 2020-10-12 DIAGNOSIS — L03116 Cellulitis of left lower limb: Secondary | ICD-10-CM | POA: Diagnosis not present

## 2020-10-13 DIAGNOSIS — B999 Unspecified infectious disease: Secondary | ICD-10-CM | POA: Diagnosis not present

## 2020-10-14 DIAGNOSIS — B999 Unspecified infectious disease: Secondary | ICD-10-CM | POA: Diagnosis not present

## 2020-10-17 DIAGNOSIS — Z6830 Body mass index (BMI) 30.0-30.9, adult: Secondary | ICD-10-CM | POA: Diagnosis not present

## 2020-10-17 DIAGNOSIS — L03116 Cellulitis of left lower limb: Secondary | ICD-10-CM | POA: Diagnosis not present

## 2020-10-17 DIAGNOSIS — E6609 Other obesity due to excess calories: Secondary | ICD-10-CM | POA: Diagnosis not present

## 2020-10-17 DIAGNOSIS — B999 Unspecified infectious disease: Secondary | ICD-10-CM | POA: Diagnosis not present

## 2020-10-28 DIAGNOSIS — Z7901 Long term (current) use of anticoagulants: Secondary | ICD-10-CM | POA: Diagnosis not present

## 2020-10-28 DIAGNOSIS — G4734 Idiopathic sleep related nonobstructive alveolar hypoventilation: Secondary | ICD-10-CM | POA: Diagnosis not present

## 2020-10-31 ENCOUNTER — Ambulatory Visit: Payer: Medicare HMO | Admitting: Vascular Surgery

## 2020-10-31 ENCOUNTER — Other Ambulatory Visit: Payer: Medicare HMO

## 2020-11-02 ENCOUNTER — Other Ambulatory Visit (HOSPITAL_COMMUNITY): Payer: Self-pay | Admitting: Vascular Surgery

## 2020-11-02 ENCOUNTER — Ambulatory Visit: Payer: Medicare HMO | Admitting: Vascular Surgery

## 2020-11-02 ENCOUNTER — Other Ambulatory Visit (INDEPENDENT_AMBULATORY_CARE_PROVIDER_SITE_OTHER): Payer: Medicare HMO

## 2020-11-02 ENCOUNTER — Other Ambulatory Visit: Payer: Medicare HMO

## 2020-11-02 ENCOUNTER — Other Ambulatory Visit: Payer: Self-pay

## 2020-11-02 ENCOUNTER — Encounter: Payer: Self-pay | Admitting: Vascular Surgery

## 2020-11-02 VITALS — BP 147/67 | HR 50 | Temp 97.3°F | Ht 70.0 in | Wt 202.2 lb

## 2020-11-02 DIAGNOSIS — I714 Abdominal aortic aneurysm, without rupture, unspecified: Secondary | ICD-10-CM

## 2020-11-02 DIAGNOSIS — I739 Peripheral vascular disease, unspecified: Secondary | ICD-10-CM

## 2020-11-02 DIAGNOSIS — I779 Disorder of arteries and arterioles, unspecified: Secondary | ICD-10-CM

## 2020-11-02 NOTE — Progress Notes (Signed)
[facility] [place]  Patient name: Troy [name] MRN: [id number] DOB: Jul 15, 1940 Sex: male  REASON FOR VISIT: Follow-up diffuse peripheral vascular disease  HPI: Troy [name] is a 80 y.o. male here today for follow-up.  He has diffuse peripheral vascular disease.  Was seen 1 year ago and is here for routine follow-up.  He is using a walker today.  He reports that he has difficulty due to arthritis in his hips and uses a walker for stability when he is out and about.  Does not use at home.  He reports that he did drop a vacuum cleaner striking his left great toe and foot and had some infection which was treated with oral antibiotics and resolved.  He has no symptoms referable to his aneurysm.  He specifically denies any episodes of amaurosis fugax, transient ischemic attack or stroke.  Past Medical History:  Diagnosis Date   AAA (abdominal aortic aneurysm) ([place])    Followed by Dr. Sherren Mocha Jahleel Stroschein   Arthritis    CAD (coronary artery disease)    a. CABG 2000.   Cancer [facility])    Prostate:  Radiation Tx   Carotid artery disease ([place])    Chest pain    precordial. mild chronic .[name]... nonischemic   CHF (congestive heart failure) (H[facility])    Chronic edema    Coronary artery disease    a. Nuclear, January, 2008, no ischemia b. Cath 08/2012- 1/4 patent grafts, RCA CTO, no flow-limiting disease, medically managed   Diabetes mellitus without complication (HCC)    Dizziness 02/2011   Dyslipidemia    Fall    GERD (gastroesophageal reflux disease)    TAKES TUMS & ROLAIDS AS NEEDED   Hx of CABG 2000   Hypertension    Myocardial infarction ([place]) 1999   Neck pain 02/2011   Pneumonia    Pulmonary hypertension ([place])    Renal artery stenosis (HCC)    50-70%   S/P femoropopliteal bypass surgery    Dr. Donnetta Hutching   Sinus bradycardia    Asymptomatic    Family History  Problem Relation Age of Onset   Deep vein thrombosis Father    Lung cancer  Sister    Diabetes Sister    Heart disease Sister        After age 52   Hyperlipidemia Sister    Hypertension Sister    Lung cancer Sister    Breast cancer Sister    Hypertension Mother    Diabetes Sister    Coronary artery disease Other        family hx of   Cancer Brother        "crab cancer"   Heart disease Brother    Heart attack Brother    Heart attack Daughter    Colon cancer Neg Hx    Stroke Neg Hx     SOCIAL HISTORY: Social History   Tobacco Use   Smoking status: Former    Packs/day: 2.00    Years: 70.00    Pack years: 140.00    Types: Cigarettes    Quit date: 04/21/2018    Years since quitting: 2.5   Smokeless tobacco: Never  Substance Use Topics   Alcohol use: Yes    Alcohol/week: 0.0 - 2.0 standard drinks    Comment: OCCASIONAL    Allergies  Allergen Reactions   Niacin Itching and Rash    Burning sensation   Contrast Media [Iodinated Diagnostic Agents] Nausea And Vomiting  Iodine-131 Nausea And Vomiting   Nsaids Other (See Comments)    Taking Coumadin    Current Outpatient Medications  Medication Sig Dispense Refill   acetaminophen (TYLENOL) 325 MG tablet Take 2 tablets (650 mg total) by mouth every 6 (six) hours as needed for mild pain, fever or headache (or Fever >/= 101). 12 tablet 0   albuterol (VENTOLIN HFA) 108 (90 Base) MCG/ACT inhaler Inhale 2 puffs into the lungs every 6 (six) hours as needed for wheezing or shortness of breath. 1 each 2   amLODipine (NORVASC) 5 MG tablet Take 1 tablet (5 mg total) by mouth daily. For BP 30 tablet 4   aspirin 81 MG EC tablet Take 1 tablet (81 mg total) by mouth daily with breakfast. 30 tablet 3   atorvastatin (LIPITOR) 40 MG tablet Take 1 tablet (40 mg total) by mouth daily. 90 tablet 1   famotidine (PEPCID) 20 MG tablet One after supper 30 tablet 11   HYDROcodone-acetaminophen (NORCO/VICODIN) 5-325 MG tablet Take 1 tablet by mouth daily as needed for moderate pain.     ipratropium (ATROVENT) 0.03 % nasal  spray Place 2 sprays into both nostrils 2 (two) times daily.     ipratropium-albuterol (DUONEB) 0.5-2.5 (3) MG/3ML SOLN Take 3 mLs by nebulization every 4 (four) hours as needed. 360 mL 1   isosorbide mononitrate (IMDUR) 60 MG 24 hr tablet Take 2 tablets (120 mg total) by mouth every evening. 90 tablet 6   losartan (COZAAR) 25 MG tablet Take 1 tablet (25 mg total) by mouth daily. 30 tablet 4   metFORMIN (GLUCOPHAGE) 1000 MG tablet Take 1,000 mg by mouth daily with breakfast.     metoprolol succinate (TOPROL XL) 25 MG 24 hr tablet Take 1 tablet (25 mg total) by mouth daily. 90 tablet 3   nitroGLYCERIN (NITROSTAT) 0.4 MG SL tablet Place 1 tablet (0.4 mg total) under the tongue every 5 (five) minutes x 3 doses as needed for chest pain. 25 tablet 3   OXYGEN Inhale into the lungs as needed. 3 liters n/c     pantoprazole (PROTONIX) 40 MG tablet Take 1 tablet (40 mg total) by mouth daily. Take 30-60 min before first meal of the day     potassium chloride (KLOR-CON) 10 MEQ tablet Take 1 tablet (10 mEq total) by mouth daily. Only take while taking Torsemide 30 tablet 3   torsemide (DEMADEX) 10 MG tablet Take 1 tablet (10 mg total) by mouth every other day. Take 20 mg for weight gain over 3 lbs in 24 hours 30 tablet 3   warfarin (COUMADIN) 2 MG tablet Take 2 mg by mouth every evening. MANAGED BY PMD     No current facility-administered medications for this visit.    REVIEW OF SYSTEMS:  '[X]'$  denotes positive finding, '[ ]'$  denotes negative finding Cardiac  Comments:  Chest pain or chest pressure: x   Shortness of breath upon exertion: x   Short of breath when lying flat:    Irregular heart rhythm: x       Vascular    Pain in calf, thigh, or hip brought on by ambulation:    Pain in feet at night that wakes you up from your sleep:     Blood clot in your veins:    Leg swelling:  x         PHYSICAL EXAM: Vitals:   11/02/20 0958  BP: (!) 147/67  Pulse: (!) 50  Temp: (!) 97.3 F (36.3 C)  TempSrc:  Temporal  SpO2: 97%  Weight: 202 lb 3.2 oz (91.7 kg)  Height: '5\' 10"'$  (1.778 m)    GENERAL: The patient is a well-nourished male, in no acute distress. The vital signs are documented above. CARDIOVASCULAR: Carotid arteries without bruits bilaterally.  2+ radial pulses bilaterally.  I do not palpate pedal pulses. PULMONARY: There is good air exchange  MUSCULOSKELETAL: There are no major deformities or cyanosis. NEUROLOGIC: No focal weakness or paresthesias are detected. SKIN: There are no ulcers or rashes noted.  The area of prior wound on the left great toe appears healed.  He does have some dependent rubor on his foot but no evidence of infection PSYCHIATRIC: The patient has a normal affect.  DATA:  Noninvasive studies today revealed no significant change in the maximal diameter of his aorta.  Maximal size is 4.8 as compared to 4.71-year ago  Extremity ankle arm index are likely elevated due to medial calcification.  He has monophasic flow at the dorsalis pedis and posterior tibial bilaterally  Carotid duplex reveals no change with 40 to 59% stenosis in the left internal carotid and 139 in the right internal carotid artery    MEDICAL ISSUES: Stable for his peripheral vascular disease standpoint.  I again discussed symptoms of carotid disease and history report immediately should he have any symptoms.  Also discussed symptoms of leaking aneurysm should this occur.  I will see him again in 1 year.  We will obtain CT of his abdomen and pelvis at that time of his aortic aneurysm.  We will also plan carotid duplex follow-up in 1 year    Rosetta Posner, MD FACS [facility] [place] Office Tel [phone]  Note: Portions of this report may have been transcribed using voice recognition software.  Every effort has been made to ensure accuracy; however, inadvertent computerized transcription errors may still be present.

## 2020-11-09 ENCOUNTER — Encounter (HOSPITAL_BASED_OUTPATIENT_CLINIC_OR_DEPARTMENT_OTHER): Payer: Self-pay

## 2020-11-09 DIAGNOSIS — G4733 Obstructive sleep apnea (adult) (pediatric): Secondary | ICD-10-CM

## 2020-11-13 ENCOUNTER — Other Ambulatory Visit: Payer: Self-pay

## 2020-11-13 ENCOUNTER — Ambulatory Visit: Payer: No Typology Code available for payment source | Attending: Internal Medicine | Admitting: Neurology

## 2020-11-13 DIAGNOSIS — G4733 Obstructive sleep apnea (adult) (pediatric): Secondary | ICD-10-CM | POA: Diagnosis not present

## 2020-11-13 DIAGNOSIS — G4736 Sleep related hypoventilation in conditions classified elsewhere: Secondary | ICD-10-CM | POA: Diagnosis not present

## 2020-11-16 ENCOUNTER — Ambulatory Visit: Payer: Medicare HMO | Admitting: Cardiology

## 2020-11-25 NOTE — Procedures (Signed)
[place] A. Merlene Laughter, MD     [email or web address]             NOCTURNAL POLYSOMNOGRAPHY   LOCATION: [facility]  Patient Name: Troy [name], Troy [name] Date: 11/13/2020 Gender: Male D.O.B: [birth year] Age (years): 42 Referring Provider: Phillips Odor MD, ABSM Height (inches): 70 Interpreting Physician: Phillips Odor MD, ABSM Weight (lbs): 206 RPSGT: Rosebud Poles BMI: 30 MRN: [id number] Neck Size: 16.50 CLINICAL INFORMATION Sleep Study Type: NPSG     Indication for sleep study: N/A     Epworth Sleepiness Score: 3     SLEEP STUDY TECHNIQUE As per the AASM Manual for the Scoring of Sleep and Associated Events v2.3 (April 2016) with a hypopnea requiring 4% desaturations.  The channels recorded and monitored were frontal, central and occipital EEG, electrooculogram (EOG), submentalis EMG (chin), nasal and oral airflow, thoracic and abdominal wall motion, anterior tibialis EMG, snore microphone, electrocardiogram, and pulse oximetry.  MEDICATIONS Medications self-administered by patient taken the night of the study : N/A  Current Outpatient Medications:    acetaminophen (TYLENOL) 325 MG tablet, Take 2 tablets (650 mg total) by mouth every 6 (six) hours as needed for mild pain, fever or headache (or Fever >/= 101)., Disp: 12 tablet, Rfl: 0   albuterol (VENTOLIN HFA) 108 (90 Base) MCG/ACT inhaler, Inhale 2 puffs into the lungs every 6 (six) hours as needed for wheezing or shortness of breath., Disp: 1 each, Rfl: 2   amLODipine (NORVASC) 5 MG tablet, Take 1 tablet (5 mg total) by mouth daily. For BP, Disp: 30 tablet, Rfl: 4   aspirin 81 MG EC tablet, Take 1 tablet (81 mg total) by mouth daily with breakfast., Disp: 30 tablet, Rfl: 3   atorvastatin (LIPITOR) 40 MG tablet, Take 1 tablet (40 mg total) by mouth daily., Disp: 90 tablet, Rfl: 1   famotidine (PEPCID) 20 MG tablet, One after supper, Disp: 30 tablet, Rfl: 11   HYDROcodone-acetaminophen (NORCO/VICODIN)  5-325 MG tablet, Take 1 tablet by mouth daily as needed for moderate pain., Disp: , Rfl:    ipratropium (ATROVENT) 0.03 % nasal spray, Place 2 sprays into both nostrils 2 (two) times daily., Disp: , Rfl:    ipratropium-albuterol (DUONEB) 0.5-2.5 (3) MG/3ML SOLN, Take 3 mLs by nebulization every 4 (four) hours as needed., Disp: 360 mL, Rfl: 1   isosorbide mononitrate (IMDUR) 60 MG 24 hr tablet, Take 2 tablets (120 mg total) by mouth every evening., Disp: 90 tablet, Rfl: 6   losartan (COZAAR) 25 MG tablet, Take 1 tablet (25 mg total) by mouth daily., Disp: 30 tablet, Rfl: 4   metFORMIN (GLUCOPHAGE) 1000 MG tablet, Take 1,000 mg by mouth daily with breakfast., Disp: , Rfl:    metoprolol succinate (TOPROL XL) 25 MG 24 hr tablet, Take 1 tablet (25 mg total) by mouth daily., Disp: 90 tablet, Rfl: 3   nitroGLYCERIN (NITROSTAT) 0.4 MG SL tablet, Place 1 tablet (0.4 mg total) under the tongue every 5 (five) minutes x 3 doses as needed for chest pain., Disp: 25 tablet, Rfl: 3   OXYGEN, Inhale into the lungs as needed. 3 liters n/c, Disp: , Rfl:    pantoprazole (PROTONIX) 40 MG tablet, Take 1 tablet (40 mg total) by mouth daily. Take 30-60 min before first meal of the day, Disp: , Rfl:    potassium chloride (KLOR-CON) 10 MEQ tablet, Take 1 tablet (10 mEq total) by mouth daily. Only take while taking Torsemide, Disp: 30 tablet, Rfl: 3   torsemide (DEMADEX) 10 MG  tablet, Take 1 tablet (10 mg total) by mouth every other day. Take 20 mg for weight gain over 3 lbs in 24 hours, Disp: 30 tablet, Rfl: 3   warfarin (COUMADIN) 2 MG tablet, Take 2 mg by mouth every evening. MANAGED BY PMD, Disp: , Rfl:      SLEEP ARCHITECTURE The study was initiated at 11:13:58 PM and ended at 5:46:56 AM.  Sleep onset time was 27.7 minutes and the sleep efficiency was 48.2%. The total sleep time was 189.5 minutes.  Stage REM latency was 148.5 minutes.  The patient spent 12.93% of the night in stage N1 sleep, 54.88% in stage N2  sleep, 22.16% in stage N3 and 10% in REM.  Alpha intrusion was absent.  Supine sleep was 0.00%.  RESPIRATORY PARAMETERS The overall apnea/hypopnea index (AHI) was 7.9 per hour. There were 2 total apneas, including 2 obstructive, 0 central and 0 mixed apneas. There were 23 hypopneas and 0 RERAs.   The AHI during Stage REM sleep was 3.2 per hour.  AHI while supine was N/A per hour.  The mean oxygen saturation was 89.28%. The minimum SpO2 during sleep was 79.00%.Total oxygen in the saturation less than 88% throughout the night is 107 minutes. The minutes below 88% while the patient slept is 93 minutes.  moderate snoring was noted during this study.  CARDIAC DATA The 2 lead EKG demonstrated sinus rhythm. The mean heart rate was 43.06 beats per minute. Other EKG findings include: None.  LEG MOVEMENT DATA The total PLMS were 0 with a resulting PLMS index of 0.00. Associated arousal with leg movement index was 0.0.  IMPRESSIONS  Significant hypoventilation syndrome is noted throughout the recording. Two L nocturnal oxygen is recommended.  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 The severity does not require positive pressure treatment.   Delano Metz, MD Diplomate, American Board of Sleep Medicine.  ELECTRONICALLY SIGNED ON:  11/25/2020, 10:28 AM [place] PH: [phone]   FX: [phone] [place]

## 2020-11-28 ENCOUNTER — Other Ambulatory Visit (HOSPITAL_COMMUNITY): Payer: Self-pay | Admitting: Cardiology

## 2020-11-28 DIAGNOSIS — G4734 Idiopathic sleep related nonobstructive alveolar hypoventilation: Secondary | ICD-10-CM | POA: Diagnosis not present

## 2020-12-02 ENCOUNTER — Other Ambulatory Visit (HOSPITAL_COMMUNITY): Payer: Self-pay | Admitting: Cardiology

## 2020-12-19 DIAGNOSIS — I25708 Atherosclerosis of coronary artery bypass graft(s), unspecified, with other forms of angina pectoris: Secondary | ICD-10-CM | POA: Diagnosis not present

## 2020-12-19 DIAGNOSIS — Z6831 Body mass index (BMI) 31.0-31.9, adult: Secondary | ICD-10-CM | POA: Diagnosis not present

## 2020-12-19 DIAGNOSIS — Z7901 Long term (current) use of anticoagulants: Secondary | ICD-10-CM | POA: Diagnosis not present

## 2020-12-19 DIAGNOSIS — R600 Localized edema: Secondary | ICD-10-CM | POA: Diagnosis not present

## 2020-12-19 DIAGNOSIS — E6609 Other obesity due to excess calories: Secondary | ICD-10-CM | POA: Diagnosis not present

## 2020-12-19 DIAGNOSIS — I4891 Unspecified atrial fibrillation: Secondary | ICD-10-CM | POA: Diagnosis not present

## 2020-12-19 DIAGNOSIS — E118 Type 2 diabetes mellitus with unspecified complications: Secondary | ICD-10-CM | POA: Diagnosis not present

## 2020-12-19 DIAGNOSIS — E782 Mixed hyperlipidemia: Secondary | ICD-10-CM | POA: Diagnosis not present

## 2020-12-29 DIAGNOSIS — G4734 Idiopathic sleep related nonobstructive alveolar hypoventilation: Secondary | ICD-10-CM | POA: Diagnosis not present

## 2020-12-30 NOTE — Progress Notes (Signed)
PCP: Sharilyn Sites, MD HF Cardiology: Dr. Aundra Dubin  80 y.o. with history of permanent atrial fibrillation, CAD s/p CABG 2000, PAD, chronic diastolic CHF was referred to CHF clinic by Melina Copa, PA, for evaluation of CHF and pulmonary hypertension.  Patient had CABG in 2000, last cath was in 2014 showing occluded SVG-OM, occluded SVG-PDA, occluded RCA, and atretic LIMA.  SVG-D was patent and native LAD was patent.  Patient had bilateral fem-pop bypasses, peripheral dopplers in 7/21 showed that the right fem-pop was occluded.  He is in permanent atrial fibrillation on warfarin. Echo in 10/21 showed normal LV systolic function with moderately dilated/mildly dysfunctional RV and PASP 75 mmHg.  In 12/21, he was admitted with PNA and treated with antibiotics.  CTA chest showed no PE, RML PNA, and bilateral hilar + mediastinal lymphadenopathy.  He was sent home from this admission on home oxygen.   In 1/22, RHC/LHC was done.  This showed patent LIMA-LAD and SVG-D, occluded SVG-OM and occluded SVG-RCA, 95% calcified proximal RCA stenosis, totally occluded PLV with collaterals (known from prior).  There was moderate pulmonary hypertension with optimized left and right heart filling pressures.  I reviewed the cath with interventional cardiology, would require atherectomy to treat the RCA.  We decided to manage medically initially.   He stopped Jardiance due to penile yeast infection.   Today he returns for HF follow up. He does not have exertional dyspnea walking on flat ground. Rare atypical chest pain, not exertional. He uses oxygen at night and walks with a walker around the house of balance.  Overall feeling fine. Denies dizziness, edema, or PND/Orthopnea. Appetite ok. No fever or chills. Weight at home 201 pounds. Taking all medications.   Labs (12/21): K 3.8, creatinine 0.83, hgb 9.5 Labs (1/22): K 3.8, creatinine 1.22, LDL 49 Labs (2/22): K 3.6, creatinine 0.86 Labs (5/22): K 3.9, creatinine  0.77  PMH: 1. Atrial fibrillation: Permanent.  2. CAD: CABG 2000.  - LHC (2014) with totally occluded RCA, totally occluded SVG-RCA, totally occluded SVG-OM, atretic LIMA, patent SVG-D.   - LHC (1/22): patent LIMA-LAD and SVG-D, occluded SVG-OM and occluded SVG-RCA, 60-70% calcified ostial RCA stenosis and 95% calcified proximal RCA stenosis, totally occluded PLV with collaterals (known from prior). 3. HTN 4. PAD: s/p bilateral fem-pop bypasses.   - Peripheral arterial dopplers (7/21): Occluded right fem-pop, patent left fem-pop.  5. Type 2 diabetes 6. Prostate cancer: Treated with radiation. 7. PVCs 8. History of renal artery stenosis.  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  10. Chronic diastolic CHF: Echo (33/00) with EF 60-65%, mildly decreased RV systolic function with moderate RV enlargement, severe biatrial enlargement, PASP 75 mmHg, dilated IVC.  - RHC (1/22): mean RA 6, PA 65/18 mean 35, mean PCWP 14, CI 2.71, PVR 3.7 WU.  11. 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   SH: Lives in [place] with wife.  Retired.  Remote smoker (>20 years ago). No ETOH.   Family History  Problem Relation Age of Onset   Deep vein thrombosis Father    Lung cancer Sister    Diabetes Sister    Heart disease Sister        After age 16   Hyperlipidemia Sister    Hypertension Sister    Lung cancer Sister    Breast cancer Sister    Hypertension Mother    Diabetes Sister    Coronary artery disease Other        family hx of  Cancer Brother        "crab cancer"   Heart disease Brother    Heart attack Brother    Heart attack Daughter    Colon cancer Neg Hx    Stroke Neg Hx    ROS: All systems reviewed and negative except as per HPI.   Current Outpatient Medications  Medication Sig Dispense Refill   acetaminophen (TYLENOL) 325 MG tablet Take 2 tablets (650 mg total) by mouth every 6 (six) hours as needed for mild pain, fever or headache (or Fever >/= 101). 12 tablet 0    albuterol (VENTOLIN HFA) 108 (90 Base) MCG/ACT inhaler Inhale 2 puffs into the lungs every 6 (six) hours as needed for wheezing or shortness of breath. 1 each 2   amLODipine (NORVASC) 5 MG tablet Take 1 tablet (5 mg total) by mouth daily. For BP 30 tablet 4   aspirin 81 MG EC tablet Take 1 tablet (81 mg total) by mouth daily with breakfast. 30 tablet 3   atorvastatin (LIPITOR) 40 MG tablet Take 1 tablet (40 mg total) by mouth daily. 90 tablet 1   famotidine (PEPCID) 20 MG tablet One after supper 30 tablet 11   HYDROcodone-acetaminophen (NORCO/VICODIN) 5-325 MG tablet Take 1 tablet by mouth daily as needed for moderate pain.     ipratropium (ATROVENT) 0.03 % nasal spray Place 2 sprays into both nostrils 2 (two) times daily.     ipratropium-albuterol (DUONEB) 0.5-2.5 (3) MG/3ML SOLN Take 3 mLs by nebulization every 4 (four) hours as needed. 360 mL 1   isosorbide mononitrate (IMDUR) 60 MG 24 hr tablet Take 2 tablets (120 mg total) by mouth every evening. 90 tablet 6   losartan (COZAAR) 25 MG tablet Take 1 tablet (25 mg total) by mouth daily. 30 tablet 4   metFORMIN (GLUCOPHAGE) 1000 MG tablet Take 1,000 mg by mouth daily with breakfast.     metoprolol succinate (TOPROL XL) 25 MG 24 hr tablet Take 1 tablet (25 mg total) by mouth daily. 90 tablet 3   nitroGLYCERIN (NITROSTAT) 0.4 MG SL tablet Place 1 tablet (0.4 mg total) under the tongue every 5 (five) minutes x 3 doses as needed for chest pain. 25 tablet 3   OXYGEN Inhale into the lungs as needed. 3 liters n/c     pantoprazole (PROTONIX) 40 MG tablet Take 1 tablet (40 mg total) by mouth daily. Take 30-60 min before first meal of the day     potassium chloride (KLOR-CON) 10 MEQ tablet Take 1 tablet (10 mEq total) by mouth daily. Only take while taking Torsemide 30 tablet 3   torsemide (DEMADEX) 10 MG tablet Take 1 tablet (10 mg total) by mouth every other day. Take 20 mg for weight gain over 3 lbs in 24 hours 30 tablet 3   warfarin (COUMADIN) 2 MG  tablet Take 2 mg by mouth every evening. MANAGED BY PMD     No current facility-administered medications for this encounter.   Wt Readings from Last 3 Encounters:  01/02/21 94 kg (207 lb 3.2 oz)  11/02/20 91.7 kg (202 lb 3.2 oz)  08/30/20 91.4 kg (201 lb 6.4 oz)   BP (!) 142/80   Pulse 74   Wt 94 kg (207 lb 3.2 oz)   SpO2 99%   BMI 29.73 kg/m   General:  NAD. No resp difficulty, walked into clinic using walker. HEENT: Normal, bilateral hearing aids Neck: Supple. No JVD. Carotids 2+ bilat; no bruits. No lymphadenopathy or thryomegaly appreciated. Cor:  PMI nondisplaced. Irregular rate & rhythm. No rubs, gallops or murmurs. Lungs: Clear Abdomen: Soft, nontender, nondistended. No hepatosplenomegaly. No bruits or masses. Good bowel sounds. Extremities: No cyanosis, clubbing, rash, edema, compression hose in place Neuro: Alert & oriented x 3, cranial nerves grossly intact. Moves all 4 extremities w/o difficulty. Affect pleasant.  Assessment/Plan: 1. Chronic diastolic CHF: Echo in 61/44 with EF 60-65%, mildly decreased RV systolic function with moderate RV enlargement, severe biatrial enlargement, PASP 75 mmHg, dilated IVC. After increasing diuretics, RHC in 1/22 showed normal filling pressures with moderate pulmonary hypertension. He is not volume overloaded today. NYHA class II.  - Continue torsemide 10 mg every other day.  BMET/BNP today.  - Continue losartan 25 mg daily. - Jardiance stopped due to yeast infection. 2. 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  Moderate PH on RHC in 1/22.  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  Less likely group 1 PH.  No PE on recent CTA.  - Though CTA showed no PE, he should have V/Q scan for completeness (ordered).   3. Chronic hypoxemic respiratory failure: Remote smoker.  Hilar and mediastinal lymphadenopathy on recent CT, no reported ILD.  Recurrent aspiration PNA in setting of chronic right vocal cord  paralysis.  Was on home oxygen but able to stop after more effective diuresis, now just uses oxygen at night.  - Repeat noncontrast chest CT has been ordered to followup on hilar and mediastinal lymphadenopathy. Will help schedule this today. - Should follow with pulmonary.  4. CAD: s/p CABG in 2000.  Cath in 1/22 showed patent LIMA-LAD and SVG-D but occluded SVG-OM and SVG-RCA.  There was 60-70% ostial RCA stenosis and 95% proximal RCA stenosis with heavy calcification.  Discussed with interventional, with normal EF plan was to proceed with initial medical management and proceed to PCI if chest pain worsened (PCI would require atherectomy). Minimal atypical chest pain recently.     - Continue ASA 81 and atorvastatin, good lipids in 1/22.  - Continue Imdur 120 mg daily and Toprol XL 25 mg daily for angina control.  5. Atrial fibrillation: Permanent.  - On warfarin. INR checked by PCP. - Continue Toprol XL 25 daily.  6. PAD: Occluded right fem-pop bypass.  No claudication.  - He is followed by [facility].   Followup in 4 months with Dr. Wynema Birch [facility] FNP 01/02/2021

## 2021-01-02 ENCOUNTER — Encounter (HOSPITAL_COMMUNITY): Payer: Self-pay

## 2021-01-02 ENCOUNTER — Ambulatory Visit (HOSPITAL_COMMUNITY)
Admission: RE | Admit: 2021-01-02 | Discharge: 2021-01-02 | Disposition: A | Discharge status: Home / Self Care | Payer: Medicare HMO | Source: Ambulatory Visit | Attending: Family Medicine | Admitting: Family Medicine

## 2021-01-02 ENCOUNTER — Other Ambulatory Visit: Payer: Self-pay

## 2021-01-02 VITALS — BP 142/80 | HR 74 | Wt 207.2 lb

## 2021-01-02 DIAGNOSIS — Z7984 Long term (current) use of oral hypoglycemic drugs: Secondary | ICD-10-CM | POA: Diagnosis not present

## 2021-01-02 DIAGNOSIS — I272 Pulmonary hypertension, unspecified: Secondary | ICD-10-CM | POA: Diagnosis not present

## 2021-01-02 DIAGNOSIS — Z7901 Long term (current) use of anticoagulants: Secondary | ICD-10-CM | POA: Diagnosis not present

## 2021-01-02 DIAGNOSIS — I739 Peripheral vascular disease, unspecified: Secondary | ICD-10-CM | POA: Diagnosis not present

## 2021-01-02 DIAGNOSIS — R590 Localized enlarged lymph nodes: Secondary | ICD-10-CM | POA: Diagnosis not present

## 2021-01-02 DIAGNOSIS — I110 Hypertensive heart disease with heart failure: Secondary | ICD-10-CM | POA: Insufficient documentation

## 2021-01-02 DIAGNOSIS — Z9981 Dependence on supplemental oxygen: Secondary | ICD-10-CM | POA: Insufficient documentation

## 2021-01-02 DIAGNOSIS — I25810 Atherosclerosis of coronary artery bypass graft(s) without angina pectoris: Secondary | ICD-10-CM | POA: Insufficient documentation

## 2021-01-02 DIAGNOSIS — Z833 Family history of diabetes mellitus: Secondary | ICD-10-CM | POA: Insufficient documentation

## 2021-01-02 DIAGNOSIS — I4821 Permanent atrial fibrillation: Secondary | ICD-10-CM | POA: Diagnosis not present

## 2021-01-02 DIAGNOSIS — I2510 Atherosclerotic heart disease of native coronary artery without angina pectoris: Secondary | ICD-10-CM | POA: Insufficient documentation

## 2021-01-02 DIAGNOSIS — Z7982 Long term (current) use of aspirin: Secondary | ICD-10-CM | POA: Diagnosis not present

## 2021-01-02 DIAGNOSIS — Z8249 Family history of ischemic heart disease and other diseases of the circulatory system: Secondary | ICD-10-CM | POA: Insufficient documentation

## 2021-01-02 DIAGNOSIS — E1151 Type 2 diabetes mellitus with diabetic peripheral angiopathy without gangrene: Secondary | ICD-10-CM | POA: Diagnosis not present

## 2021-01-02 DIAGNOSIS — Z87891 Personal history of nicotine dependence: Secondary | ICD-10-CM | POA: Diagnosis not present

## 2021-01-02 DIAGNOSIS — I5032 Chronic diastolic (congestive) heart failure: Secondary | ICD-10-CM | POA: Insufficient documentation

## 2021-01-02 DIAGNOSIS — J9611 Chronic respiratory failure with hypoxia: Secondary | ICD-10-CM | POA: Diagnosis not present

## 2021-01-02 LAB — BASIC METABOLIC PANEL
Anion gap: 6 (ref 5–15)
BUN: 14 mg/dL (ref 8–23)
CO2: 28 mmol/L (ref 22–32)
Calcium: 8.9 mg/dL (ref 8.9–10.3)
Chloride: 105 mmol/L (ref 98–111)
Creatinine, Ser: 0.75 mg/dL (ref 0.61–1.24)
GFR, Estimated: >60 mL/min (ref >60)
Glucose, Bld: 129 mg/dL — ABNORMAL HIGH (ref 70–99)
Potassium: 3.8 mmol/L (ref 3.5–5.1)
Sodium: 139 mmol/L (ref 135–145)

## 2021-01-02 LAB — BRAIN NATRIURETIC PEPTIDE: B Natriuretic Peptide: 153.2 pg/mL — ABNORMAL HIGH (ref 0.0–100.0)

## 2021-01-02 NOTE — Patient Instructions (Signed)
Labs were done today, if any labs are abnormal the clinic will call you  PLEASE schedule you CT of the chest  Your physician recommends that you schedule a follow-up appointment in:  Please call in December 2022 to make a appointment for January 2023  At the [facility], you and your health needs are our priority. As part of our continuing mission to provide you with exceptional heart care, we have created designated Provider Care Teams. These Care Teams include your primary Cardiologist (physician) and Advanced Practice Providers (APPs- Physician Assistants and Nurse Practitioners) who all work together to provide you with the care you need, when you need it.   You may see any of the following providers on your designated Care Team at your next follow up: Dr Glori Bickers Dr Loralie Champagne Dr Patrice Paradise, NP Lyda Jester, [place] Ginnie Smart Audry Riles, PharmD   Please be sure to bring in all your medications bottles to every appointment.    If you have any questions or concerns before your next appointment please send [place] a message through [place] or call our office at [phone].    TO LEAVE A MESSAGE FOR THE NURSE SELECT OPTION 2, PLEASE LEAVE A MESSAGE INCLUDING: YOUR NAME DATE OF BIRTH CALL BACK NUMBER REASON FOR CALL**this is important as we prioritize the call backs  YOU WILL RECEIVE A CALL BACK THE SAME DAY AS LONG AS YOU CALL BEFORE 4:00 PM

## 2021-01-23 DIAGNOSIS — I2510 Atherosclerotic heart disease of native coronary artery without angina pectoris: Secondary | ICD-10-CM | POA: Diagnosis not present

## 2021-01-23 DIAGNOSIS — E6609 Other obesity due to excess calories: Secondary | ICD-10-CM | POA: Diagnosis not present

## 2021-01-23 DIAGNOSIS — Z6830 Body mass index (BMI) 30.0-30.9, adult: Secondary | ICD-10-CM | POA: Diagnosis not present

## 2021-01-23 DIAGNOSIS — C61 Malignant neoplasm of prostate: Secondary | ICD-10-CM | POA: Diagnosis not present

## 2021-01-25 DIAGNOSIS — Z79899 Other long term (current) drug therapy: Secondary | ICD-10-CM | POA: Diagnosis not present

## 2021-01-27 ENCOUNTER — Ambulatory Visit (HOSPITAL_COMMUNITY): Payer: No Typology Code available for payment source

## 2021-01-28 DIAGNOSIS — G4734 Idiopathic sleep related nonobstructive alveolar hypoventilation: Secondary | ICD-10-CM | POA: Diagnosis not present

## 2021-02-02 ENCOUNTER — Other Ambulatory Visit: Payer: Self-pay

## 2021-02-02 ENCOUNTER — Ambulatory Visit (INDEPENDENT_AMBULATORY_CARE_PROVIDER_SITE_OTHER): Payer: Medicare HMO | Admitting: Internal Medicine

## 2021-02-02 ENCOUNTER — Encounter: Payer: Self-pay | Admitting: Internal Medicine

## 2021-02-02 DIAGNOSIS — R0609 Other forms of dyspnea: Secondary | ICD-10-CM

## 2021-02-02 DIAGNOSIS — G4734 Idiopathic sleep related nonobstructive alveolar hypoventilation: Secondary | ICD-10-CM

## 2021-02-02 DIAGNOSIS — R058 Other specified cough: Secondary | ICD-10-CM | POA: Diagnosis not present

## 2021-02-02 NOTE — Patient Instructions (Addendum)
No change in medications.  Use more Hard rock candy (no mint/ menthol/chocalate) sugarless lifesavers/ jolley ranchers stovers or sips of water will do too to clear your throat    We will order overnight pulse oxymetry while on your usual amount of 02   Please schedule a follow up visit in 12 months but call sooner if needed

## 2021-02-02 NOTE — Progress Notes (Signed)
Troy [name], male    DOB: Sep 28, 1940,     MRN: [id number]   Brief patient profile:  82 yowm with known IHD quit smoking 04/2018 with onset around 2010 dysphagia and recurrent aspiration / pneumonia eval by Benjamine Mola with dx of paralyzed R VC last eval 04/12/20 doing "ok" per [facility] clinic referred to pulmonary clinic in [place]  06/07/2020 by Dr   Marigene Ehlers for recurrent asp and mild/mod Pulmonary hypertension     History of Present Illness  06/07/2020  Pulmonary/ 1st office eval/ Kaitlynd Phillips / [facility]  Chief Complaint  Patient presents with   Pulmonary Consult    Referred by Dr Loralie Champagne.  Pt c/o trouble with his breathing since Dec 2021. He states has hx of recurrent PNA and aspiration. He gets winded "walking a long distance". He also c/o cough with white sputum. He has albuterol inhaler and neb and has not been using these.   Dyspnea:  Has not done much since last admit   using walker at home and able to do [employer] shopping leaning on cart  Cough: feels something hanging in throat, worse with swallowing food/ worse in am's assoc with sense of pnds  Keeps a can by bed > white mucus  Sleep: flat bed / one pillow on side  SABA use: not really feeling like he needs it now but has duoneb and hfa  02 stopped 3 weeks prior to OV  And no worse off it "can I turn it back in"  rec Try albuterol (the puffer one day, the nebulizer the next)  15 min before an activity that you know would make you short of breath  Change the way you take your acid pills:  Pantoprazole (protonix) 40 mg   Take  30-60 min before first meal of the day and Pepcid (famotidine)  20 mg one after the last meal until return to office - this is the best way to tell whether stomach acid is contributing to your problem.   GERD diet  Discuss any sinus problems with your  ENT (ENT is a sinus doctor ) Please schedule a follow up office visit in 6 weeks, call sooner if needed    07/20/2020  f/u ov/[place] office/Osaze Hubbert   Chief  Complaint  Patient presents with   Follow-up    No complaints currently  Dyspnea: no change doe using saba first  Cough: sense of pnds > white mucus  Sleeping: flat bed one pillow SABA use: not now  02: not using  Covid status: x 2  Lung cancer screening: n/a  Rec You need to wear 2lpm every night to help your heart We need to  Be sure 2lpm is enough so you'll need to repeat the 02 sats on oxygen.  We will see if we can change from adapt to [place] but there are rules related to 02 equipment that everyone has to follow. Only use your albuterol as a rescue medication   02/02/2021  f/u ov/[place] office/Rayhan Groleau re: recurrent asp /noct hypoxemia  maint on 02 hs/ prn saba rarely   Chief Complaint  Patient presents with   Follow-up    Feels that the is the same since last visit. Uses between 2-3L continuous O2 at night time.   Dyspnea:  limited more by back and legs not breating  Cough: swallowing ok, not much cough unless dry food like crackers. Cereal  Sleeping: flat bed/ one pillow  SABA use: rarely  needed  02: 2 lpm hs  Covid status: 4 vax      No obvious day to day or daytime variability or assoc excess/ purulent sputum or mucus plugs or hemoptysis or cp or chest tightness, subjective wheeze or overt sinus or hb symptoms.   Sleeping as above  without nocturnal  or early am exacerbation  of respiratory  c/o's or need for noct saba. Also denies any obvious fluctuation of symptoms with weather or environmental changes or other aggravating or alleviating factors except as outlined above   No unusual exposure hx or h/o childhood pna/ asthma or knowledge of premature birth.  Current Allergies, Complete Past Medical History, Past Surgical History, Family History, and Social History were reviewed in [employer] record.  ROS  The following are not active complaints unless bolded Hoarseness, sore throat, dysphagia, dental problems, itching, sneezing,  nasal  congestion or discharge of excess mucus or purulent secretions, ear ache,   fever, chills, sweats, unintended wt loss or wt gain, classically pleuritic or exertional cp,  orthopnea pnd or arm/hand swelling  or leg swelling improved, presyncope, palpitations, abdominal pain, anorexia, nausea, vomiting, diarrhea  or change in bowel habits or change in bladder habits, change in stools or change in urine, dysuria, hematuria,  rash, arthralgias, visual complaints, headache, numbness, weakness or ataxia or problems with walking or coordination,  change in mood or  memory.        Current Meds  Medication Sig   acetaminophen (TYLENOL) 325 MG tablet Take 2 tablets (650 mg total) by mouth every 6 (six) hours as needed for mild pain, fever or headache (or Fever >/= 101).   albuterol (VENTOLIN HFA) 108 (90 Base) MCG/ACT inhaler Inhale 2 puffs into the lungs every 6 (six) hours as needed for wheezing or shortness of breath.   amLODipine (NORVASC) 5 MG tablet Take 1 tablet (5 mg total) by mouth daily. For BP   aspirin 81 MG EC tablet Take 1 tablet (81 mg total) by mouth daily with breakfast.   atorvastatin (LIPITOR) 40 MG tablet Take 1 tablet (40 mg total) by mouth daily.   famotidine (PEPCID) 20 MG tablet One after supper   HYDROcodone-acetaminophen (NORCO/VICODIN) 5-325 MG tablet Take 1 tablet by mouth daily as needed for moderate pain.   ipratropium (ATROVENT) 0.03 % nasal spray Place 2 sprays into both nostrils 2 (two) times daily.   ipratropium-albuterol (DUONEB) 0.5-2.5 (3) MG/3ML SOLN Take 3 mLs by nebulization every 4 (four) hours as needed.   isosorbide mononitrate (IMDUR) 60 MG 24 hr tablet Take 2 tablets (120 mg total) by mouth every evening.   losartan (COZAAR) 25 MG tablet Take 1 tablet (25 mg total) by mouth daily.   metFORMIN (GLUCOPHAGE) 1000 MG tablet Take 1,000 mg by mouth daily with breakfast.   metoprolol succinate (TOPROL XL) 25 MG 24 hr tablet Take 1 tablet (25 mg total) by mouth daily.    nitroGLYCERIN (NITROSTAT) 0.4 MG SL tablet Place 1 tablet (0.4 mg total) under the tongue every 5 (five) minutes x 3 doses as needed for chest pain.   OXYGEN Inhale into the lungs as needed. 3 liters n/c   pantoprazole (PROTONIX) 40 MG tablet Take 1 tablet (40 mg total) by mouth daily. Take 30-60 min before first meal of the day   potassium chloride (KLOR-CON) 10 MEQ tablet Take 1 tablet (10 mEq total) by mouth daily. Only take while taking Torsemide   torsemide (DEMADEX) 10 MG tablet Take 1 tablet (10 mg total) by mouth every other  day. Take 20 mg for weight gain over 3 lbs in 24 hours   warfarin (COUMADIN) 2 MG tablet Take 2 mg by mouth every evening. MANAGED BY PMD                 Past Medical History:  Diagnosis Date   AAA (abdominal aortic aneurysm) ([place])    Followed by Dr. Sherren Mocha Early   Arthritis    CAD (coronary artery disease)    a. CABG 2000.   Cancer [facility])    Prostate:  Radiation Tx   Carotid artery disease ([place])    Chest pain    precordial. mild chronic .[name]... nonischemic   CHF (congestive heart failure) ([facility])    Chronic edema    Coronary artery disease    a. Nuclear, January, 2008, no ischemia b. Cath 08/2012- 1/4 patent grafts, RCA CTO, no flow-limiting disease, medically managed   Diabetes mellitus without complication ([place])    Dizziness 02/2011   Dyslipidemia    GERD (gastroesophageal reflux disease)    TAKES TUMS & ROLAIDS AS NEEDED   Hx of CABG 2000   Hypertension    Myocardial infarction ([place]) 1999   Neck pain 02/2011   Pneumonia    Pulmonary hypertension ([place])    Renal artery stenosis (HCC)    50-70%   S/P femoropopliteal bypass surgery    Dr. Donnetta Hutching   Sinus bradycardia    Asymptomatic      Objective:     02/02/2021       207   07/20/20 205 lb 3.2 oz (93.1 kg)  06/07/20 205 lb 3.2 oz (93.1 kg)  05/25/20 205 lb (93 kg)     Vital signs reviewed  02/02/2021  - Note at rest 02 sats  97% on RA   General appearance:    slt hoarse amb wm mildly  congested cough          Min insp /exp rhonchi    HEENT : pt wearing mask not removed for exam due to covid -19 concerns.    NECK :  without JVD/Nodes/TM/ nl carotid upstrokes bilaterally   LUNGS: no acc muscle use,  Nl contour chest  with distant insp/exp rhonchi bilaterally without cough on insp or exp maneuvers   CV:  RRR  no s3 or murmur or increase in P2, and   Trace pitting edema L>R despite elastic hose  ABD:  soft and nontender with nl inspiratory excursion in the supine position. No bruits or organomegaly appreciated, bowel sounds nl  MS:  Nl gait/ ext warm without deformities, calf tenderness, cyanosis or clubbing No obvious joint restrictions   SKIN: warm and dry without lesions    NEURO:  alert, approp, nl sensorium with  no motor or cerebellar deficits apparent.       .     Assessment

## 2021-02-04 ENCOUNTER — Encounter: Payer: Self-pay | Admitting: Internal Medicine

## 2021-02-04 DIAGNOSIS — R058 Other specified cough: Secondary | ICD-10-CM | POA: Insufficient documentation

## 2021-02-04 NOTE — Assessment & Plan Note (Signed)
ONO RA 06/16/20  desats x 4 h on RA > rec 2lpm and repeat - 07/20/2020 not using 02 > rec take it and recheck ONO on 2lpm to assure adequate noct sats > did not do - sleep study doen 11/13/20 mild osa with desats > rec repeat ono on 2lpm   During the day advised: Make sure you check your oxygen saturation at your highest level of activity to be sure it stays over 90% and keep track of it at least once a week, more often if breathing getting worse, and let me know if losing ground.

## 2021-02-04 NOTE — Assessment & Plan Note (Signed)
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 - RHC  04/11/20 RA mean 6 RV 64/6 PA 65/18, mean 35 PCWP mean 14 Ao 112/45 Oxygen saturations: PA 62% AO 100% Cardiac Output (Fick) 5.71  Cardiac Index (Fick) 2.71 PVR 3.7 WU - PFT's  05/05/20  FEV1 2.87 (94 % ) ratio 0.76  p 0 % improvement from saba p o prior to study with DLCO  13.20 (53%) corrects to 2.46 (62 %)  for alv volume and FV curve   -  06/07/2020   Walked RA  approx   300 ft  @ slow to moderate pace  stopped due  to hip pain/no SOB with sats 93% at end    - ONO RA 06/16/20  desats x 4 h on RA > rec 2lpm and repeat (see noct hypoxemia)  - 02/02/2021 pt walked at a slow pace on room air. Reported SOB on the second lap. Stopped walk after second lap due to legs tired with lowest sats 96%   No need for ambulatory 02, might benefit for pulmonary rehab if willing to go  Each maintenance medication was reviewed in detail including emphasizing most importantly the difference between maintenance and prns and under what circumstances the prns are to be triggered using an action plan format where appropriate.  Total time for H and P, chart review, counseling,  directly observing portions of ambulatory 02 saturation study/ and generating customized AVS unique to this office visit / same day charting = 35 min

## 2021-02-04 NOTE — Assessment & Plan Note (Signed)
Advised on use of hard rock candy to avoid throat clearing and continue max gerd rx

## 2021-02-09 ENCOUNTER — Ambulatory Visit: Payer: Medicare HMO | Admitting: Cardiology

## 2021-02-28 ENCOUNTER — Telehealth (HOSPITAL_COMMUNITY): Payer: Self-pay | Admitting: *Deleted

## 2021-03-02 ENCOUNTER — Other Ambulatory Visit: Payer: Self-pay

## 2021-03-02 ENCOUNTER — Ambulatory Visit (HOSPITAL_COMMUNITY): Admission: RE | Admit: 2021-03-02 | Payer: Medicare HMO | Source: Ambulatory Visit

## 2021-03-02 ENCOUNTER — Ambulatory Visit (HOSPITAL_COMMUNITY)
Admission: RE | Admit: 2021-03-02 | Discharge: 2021-03-02 | Disposition: A | Discharge status: Home / Self Care | Payer: Medicare HMO | Source: Ambulatory Visit | Attending: Cardiology | Admitting: Cardiology

## 2021-03-02 DIAGNOSIS — R911 Solitary pulmonary nodule: Secondary | ICD-10-CM | POA: Diagnosis not present

## 2021-03-02 DIAGNOSIS — I700 Atherosclerosis of aorta: Secondary | ICD-10-CM | POA: Diagnosis not present

## 2021-03-02 DIAGNOSIS — R590 Localized enlarged lymph nodes: Secondary | ICD-10-CM | POA: Diagnosis not present

## 2021-03-02 DIAGNOSIS — J439 Emphysema, unspecified: Secondary | ICD-10-CM | POA: Diagnosis not present

## 2021-03-02 DIAGNOSIS — Z79899 Other long term (current) drug therapy: Secondary | ICD-10-CM | POA: Diagnosis not present

## 2021-03-09 ENCOUNTER — Other Ambulatory Visit (HOSPITAL_COMMUNITY): Payer: Medicare HMO

## 2021-03-23 ENCOUNTER — Other Ambulatory Visit: Payer: Self-pay | Admitting: Physician Assistant

## 2021-03-29 IMAGING — US US ABDOMEN LIMITED
1 series · 14 of 24 positions shown · non-contrast
Comparison: CTA chest 1 day prior.

CLINICAL DATA: Cirrhosis.

EXAM:
ULTRASOUND ABDOMEN LIMITED RIGHT UPPER QUADRANT

[Series 1: us abdomen limited ruq (liver/gb) · 14 of 24 slices shown]
[im 1/24]
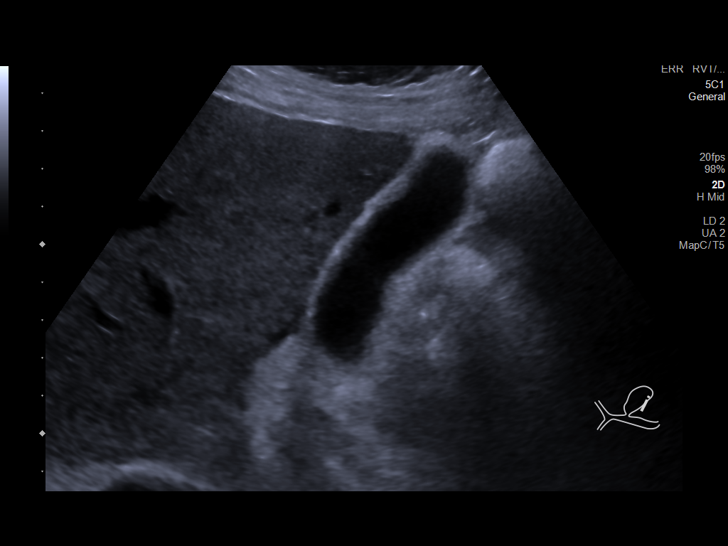
[im 3/24]
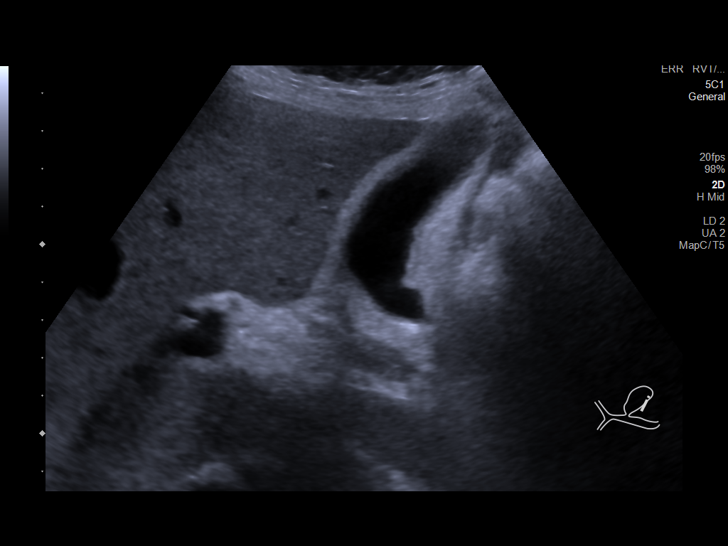
[im 5/24]
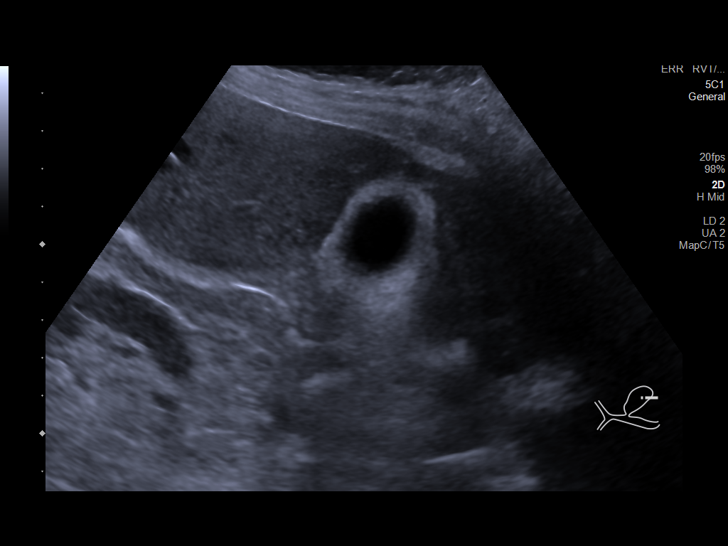
[im 7/24]
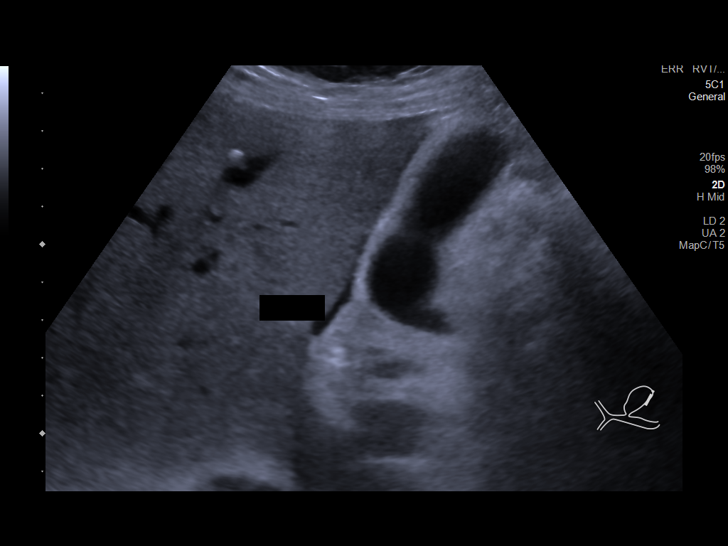
[im 8/24]
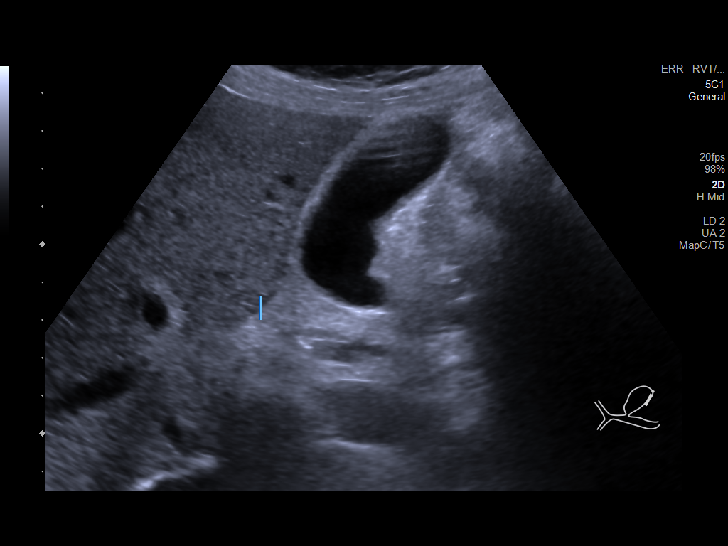
[im 10/24]
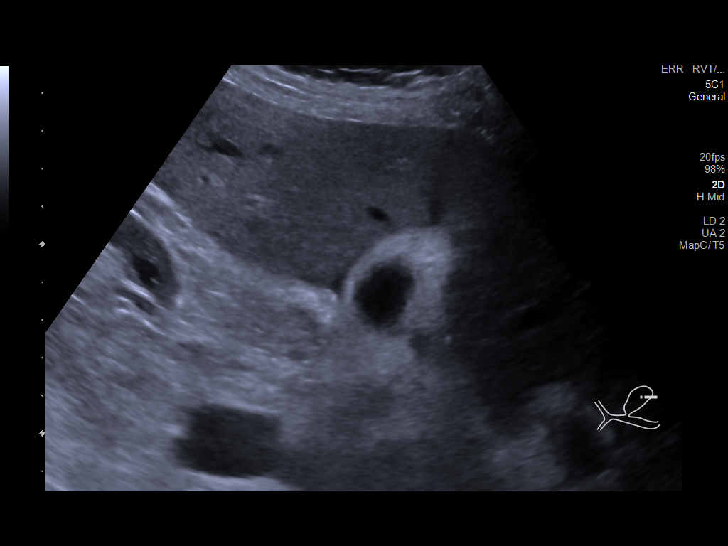
[im 12/24]
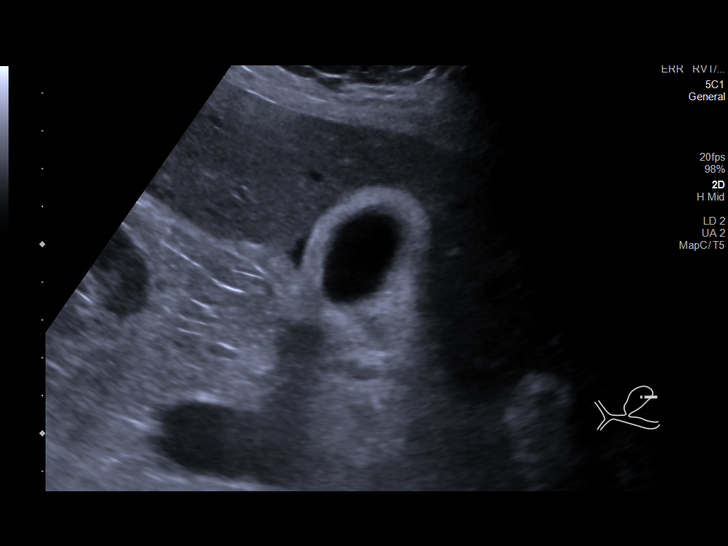
[im 13/24]
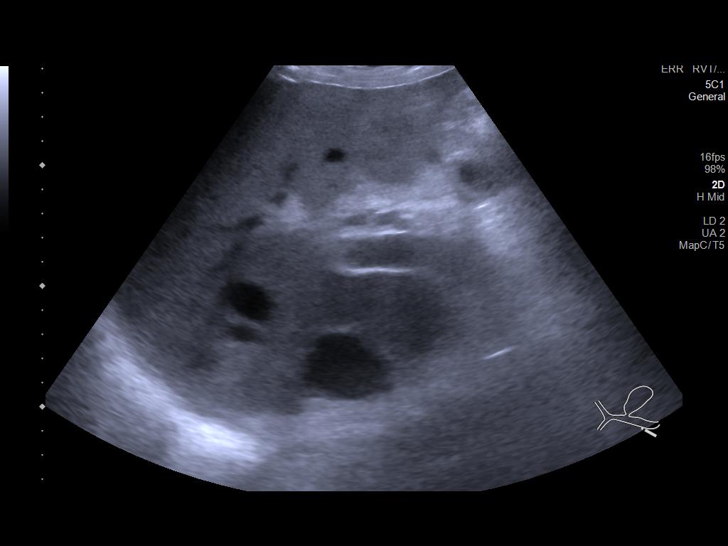
[im 15/24]
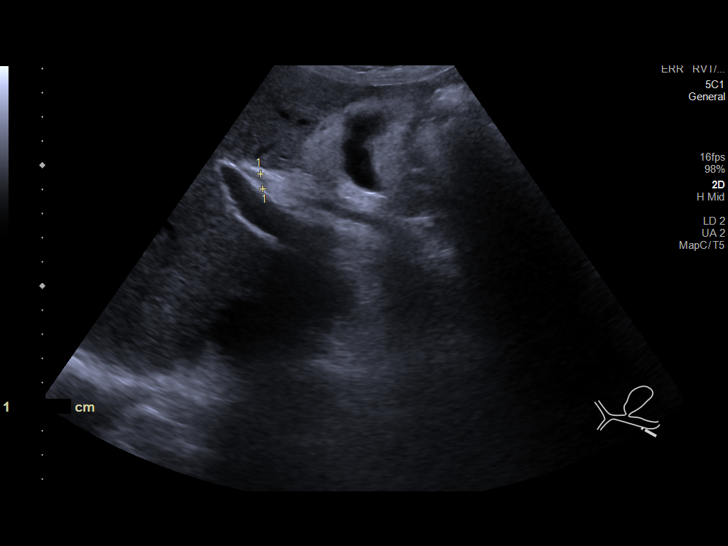
[im 17/24]
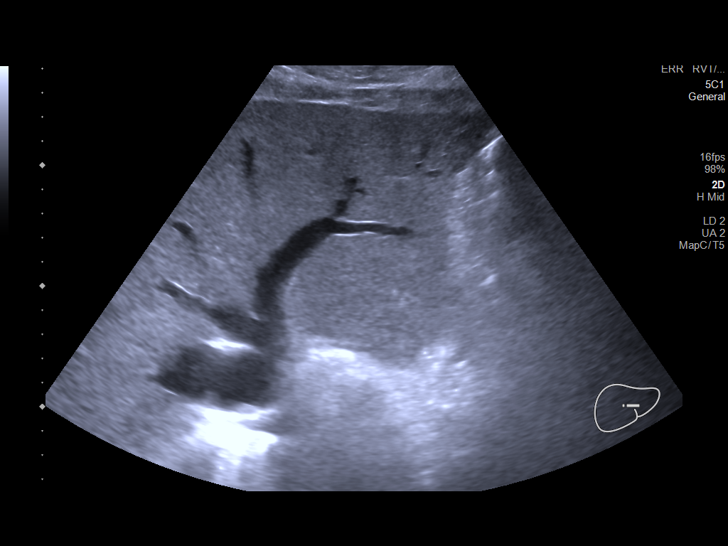
[im 19/24]
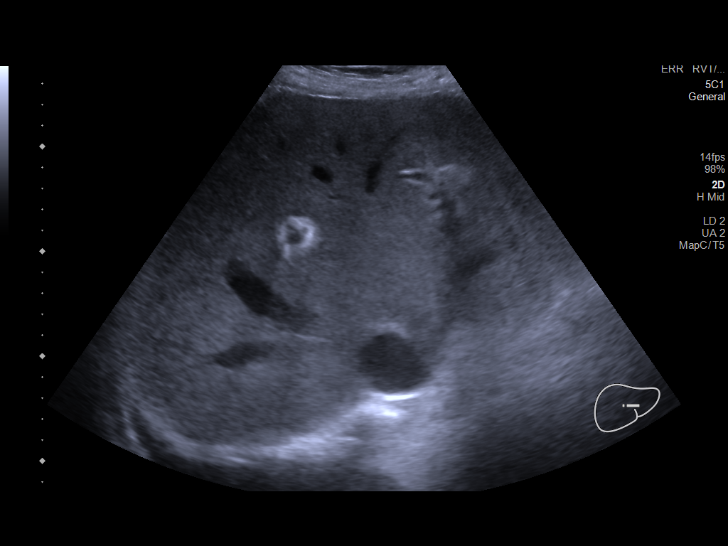
[im 20/24]
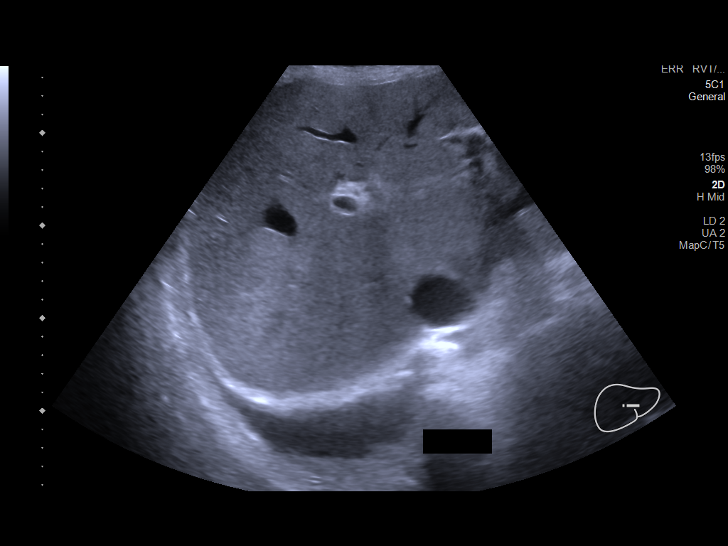
[im 22/24]
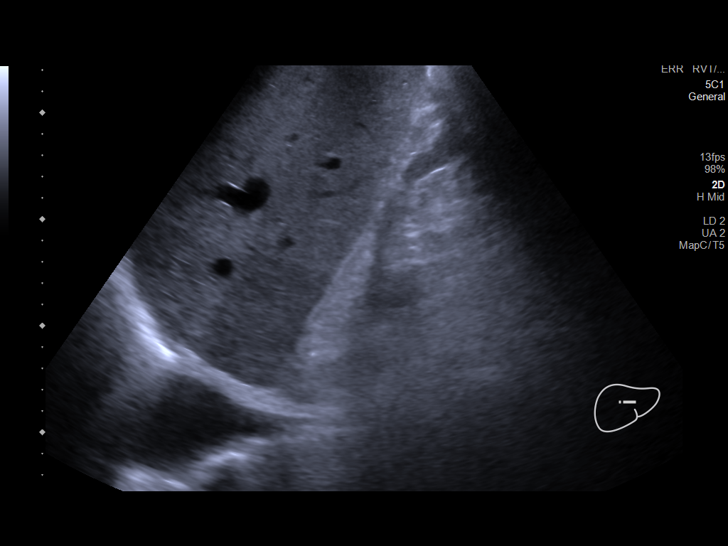
[im 24/24]
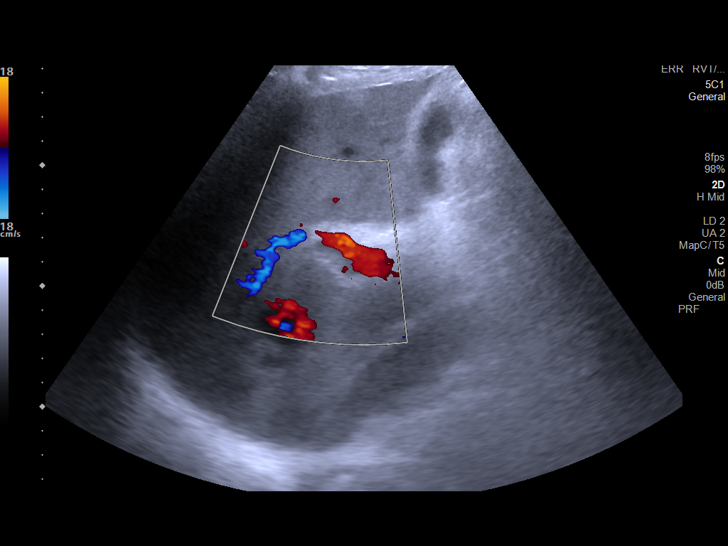

[14 of 24 positions shown; findings below may reference images not displayed]

FINDINGS: Gallbladder:

No stones. Mild gallbladder wall thickening at 4 mm. Trace
pericholecystic fluid. Sonographic Murphy's sign was not elicited.

Common bile duct:

Diameter: Normal, 8 mm.

Liver:

Mildly increased hepatic echogenicity. The left hepatic lobe lesion
on CT is not visualized. Portal vein is patent on color Doppler
imaging with normal direction of blood flow towards the liver.

Other: Right-sided pleural effusion incidentally noted.
IMPRESSION: No gallstones. Nonspecific mild gallbladder wall thickening and
pericholecystic fluid.

Mild hepatic steatosis.

Right pleural effusion.

## 2021-03-30 DIAGNOSIS — I4891 Unspecified atrial fibrillation: Secondary | ICD-10-CM | POA: Diagnosis not present

## 2021-03-30 DIAGNOSIS — Z79899 Other long term (current) drug therapy: Secondary | ICD-10-CM | POA: Diagnosis not present

## 2021-03-30 DIAGNOSIS — E7849 Other hyperlipidemia: Secondary | ICD-10-CM | POA: Diagnosis not present

## 2021-03-30 DIAGNOSIS — I2510 Atherosclerotic heart disease of native coronary artery without angina pectoris: Secondary | ICD-10-CM | POA: Diagnosis not present

## 2021-03-30 DIAGNOSIS — E6609 Other obesity due to excess calories: Secondary | ICD-10-CM | POA: Diagnosis not present

## 2021-03-30 DIAGNOSIS — Z6831 Body mass index (BMI) 31.0-31.9, adult: Secondary | ICD-10-CM | POA: Diagnosis not present

## 2021-03-30 DIAGNOSIS — G894 Chronic pain syndrome: Secondary | ICD-10-CM | POA: Diagnosis not present

## 2021-03-30 DIAGNOSIS — I10 Essential (primary) hypertension: Secondary | ICD-10-CM | POA: Diagnosis not present

## 2021-03-30 DIAGNOSIS — I25708 Atherosclerosis of coronary artery bypass graft(s), unspecified, with other forms of angina pectoris: Secondary | ICD-10-CM | POA: Diagnosis not present

## 2021-03-30 DIAGNOSIS — C61 Malignant neoplasm of prostate: Secondary | ICD-10-CM | POA: Diagnosis not present

## 2021-03-30 DIAGNOSIS — E1165 Type 2 diabetes mellitus with hyperglycemia: Secondary | ICD-10-CM | POA: Diagnosis not present

## 2021-03-30 DIAGNOSIS — E782 Mixed hyperlipidemia: Secondary | ICD-10-CM | POA: Diagnosis not present

## 2021-04-17 ENCOUNTER — Ambulatory Visit (HOSPITAL_COMMUNITY)
Admission: RE | Admit: 2021-04-17 | Discharge: 2021-04-17 | Disposition: A | Discharge status: Home / Self Care | Payer: Medicare HMO | Source: Ambulatory Visit | Attending: Cardiology | Admitting: Cardiology

## 2021-04-17 ENCOUNTER — Other Ambulatory Visit: Payer: Self-pay

## 2021-04-17 ENCOUNTER — Encounter (HOSPITAL_COMMUNITY): Payer: Self-pay | Admitting: Cardiology

## 2021-04-17 VITALS — BP 140/60 | HR 75 | Wt 205.0 lb

## 2021-04-17 DIAGNOSIS — Y832 Surgical operation with anastomosis, bypass or graft as the cause of abnormal reaction of the patient, or of later complication, without mention of misadventure at the time of the procedure: Secondary | ICD-10-CM | POA: Diagnosis not present

## 2021-04-17 DIAGNOSIS — J3801 Paralysis of vocal cords and larynx, unilateral: Secondary | ICD-10-CM | POA: Diagnosis not present

## 2021-04-17 DIAGNOSIS — I714 Abdominal aortic aneurysm, without rupture, unspecified: Secondary | ICD-10-CM | POA: Insufficient documentation

## 2021-04-17 DIAGNOSIS — Z87891 Personal history of nicotine dependence: Secondary | ICD-10-CM | POA: Insufficient documentation

## 2021-04-17 DIAGNOSIS — I4510 Unspecified right bundle-branch block: Secondary | ICD-10-CM | POA: Insufficient documentation

## 2021-04-17 DIAGNOSIS — I739 Peripheral vascular disease, unspecified: Secondary | ICD-10-CM | POA: Insufficient documentation

## 2021-04-17 DIAGNOSIS — I25119 Atherosclerotic heart disease of native coronary artery with unspecified angina pectoris: Secondary | ICD-10-CM | POA: Diagnosis not present

## 2021-04-17 DIAGNOSIS — R2689 Other abnormalities of gait and mobility: Secondary | ICD-10-CM | POA: Insufficient documentation

## 2021-04-17 DIAGNOSIS — Z87898 Personal history of other specified conditions: Secondary | ICD-10-CM | POA: Diagnosis not present

## 2021-04-17 DIAGNOSIS — Z8701 Personal history of pneumonia (recurrent): Secondary | ICD-10-CM | POA: Diagnosis not present

## 2021-04-17 DIAGNOSIS — I272 Pulmonary hypertension, unspecified: Secondary | ICD-10-CM | POA: Insufficient documentation

## 2021-04-17 DIAGNOSIS — I4821 Permanent atrial fibrillation: Secondary | ICD-10-CM | POA: Diagnosis not present

## 2021-04-17 DIAGNOSIS — Z7901 Long term (current) use of anticoagulants: Secondary | ICD-10-CM | POA: Diagnosis not present

## 2021-04-17 DIAGNOSIS — I25810 Atherosclerosis of coronary artery bypass graft(s) without angina pectoris: Secondary | ICD-10-CM | POA: Insufficient documentation

## 2021-04-17 DIAGNOSIS — R590 Localized enlarged lymph nodes: Secondary | ICD-10-CM | POA: Insufficient documentation

## 2021-04-17 DIAGNOSIS — Z8249 Family history of ischemic heart disease and other diseases of the circulatory system: Secondary | ICD-10-CM | POA: Diagnosis not present

## 2021-04-17 DIAGNOSIS — Z7982 Long term (current) use of aspirin: Secondary | ICD-10-CM | POA: Diagnosis not present

## 2021-04-17 DIAGNOSIS — Z951 Presence of aortocoronary bypass graft: Secondary | ICD-10-CM | POA: Diagnosis not present

## 2021-04-17 DIAGNOSIS — T82898A Other specified complication of vascular prosthetic devices, implants and grafts, initial encounter: Secondary | ICD-10-CM | POA: Insufficient documentation

## 2021-04-17 DIAGNOSIS — J9611 Chronic respiratory failure with hypoxia: Secondary | ICD-10-CM | POA: Insufficient documentation

## 2021-04-17 DIAGNOSIS — Z79899 Other long term (current) drug therapy: Secondary | ICD-10-CM | POA: Insufficient documentation

## 2021-04-17 DIAGNOSIS — I6522 Occlusion and stenosis of left carotid artery: Secondary | ICD-10-CM | POA: Insufficient documentation

## 2021-04-17 DIAGNOSIS — I110 Hypertensive heart disease with heart failure: Secondary | ICD-10-CM | POA: Insufficient documentation

## 2021-04-17 DIAGNOSIS — Z9981 Dependence on supplemental oxygen: Secondary | ICD-10-CM | POA: Diagnosis not present

## 2021-04-17 DIAGNOSIS — I5032 Chronic diastolic (congestive) heart failure: Secondary | ICD-10-CM | POA: Diagnosis not present

## 2021-04-17 LAB — LIPID PANEL
Cholesterol: 106 mg/dL (ref 0–200)
HDL: 37 mg/dL — ABNORMAL LOW (ref >40)
LDL Cholesterol: 62 mg/dL (ref 0–99)
Total CHOL/HDL Ratio: 2.9 RATIO
Triglycerides: 37 mg/dL (ref <150)
VLDL: 7 mg/dL (ref 0–40)

## 2021-04-17 NOTE — Patient Instructions (Signed)
No medication changes!  Labs today We will only contact you if something comes back abnormal or we need to make some changes. Otherwise no news is good news!  Your physician has requested that you have an echocardiogram. Echocardiography is a painless test that uses sound waves to create images of your heart. It provides your doctor with information about the size and shape of your heart and how well your hearts chambers and valves are working. This procedure takes approximately one hour. There are no restrictions for this procedure.  Your physician recommends that you schedule a follow-up appointment in: 4 months with the Nurse Practitioner/Physician Assistant   Please call office at [phone] option 2 if you have any questions or concerns.   At the [facility], you and your health needs are our priority. As part of our continuing mission to provide you with exceptional heart care, we have created designated Provider Care Teams. These Care Teams include your primary Cardiologist (physician) and Advanced Practice Providers (APPs- Physician Assistants and Nurse Practitioners) who all work together to provide you with the care you need, when you need it.   You may see any of the following providers on your designated Care Team at your next follow up: Dr Glori Bickers Dr Haynes Kerns, NP Lyda Jester, [place] [facility] [place], [place] Audry Riles, PharmD   Please be sure to bring in all your medications bottles to every appointment.

## 2021-04-17 NOTE — Progress Notes (Signed)
PCP: Sharilyn Sites, MD HF Cardiology: Dr. Aundra Dubin  81 y.o. with history of permanent atrial fibrillation, CAD s/p CABG 2000, PAD, chronic diastolic CHF was referred to CHF clinic by Melina Copa, PA, for evaluation of CHF and pulmonary hypertension.  Patient had CABG in 2000, last cath was in 2014 showing occluded SVG-OM, occluded SVG-PDA, occluded RCA, and atretic LIMA.  SVG-D was patent and native LAD was patent.  Patient had bilateral fem-pop bypasses, peripheral dopplers in 7/21 showed that the right fem-pop was occluded.  He is in permanent atrial fibrillation on warfarin. Echo in 10/21 showed normal LV systolic function with moderately dilated/mildly dysfunctional RV and PASP 75 mmHg.  In 12/21, he was admitted with PNA and treated with antibiotics.  CTA chest showed no PE, RML PNA, and bilateral hilar + mediastinal lymphadenopathy.  He was sent home from this admission on home oxygen.   In 1/22, RHC/LHC was done.  This showed patent LIMA-LAD and SVG-D, occluded SVG-OM and occluded SVG-RCA, 95% calcified proximal RCA stenosis, totally occluded PLV with collaterals (known from prior).  There was moderate pulmonary hypertension with optimized left and right heart filling pressures.  I reviewed the cath with interventional cardiology, would require atherectomy to treat the RCA.  We decided to manage medically initially.   He stopped Jardiance due to penile yeast infection.   Patient returns for followup of CHF.  He is doing well generally.  Weight is down 2 lbs.  He uses a walker for balance primarily, legs and back "give out."  Breathing is stable, no dyspnea on flat ground though he is not very active.  He uses oxygen at night.  Rare atypical chest pain.  No orthopnea/PND.  Dr. Donnetta Hutching follows for PV, no claudication.   Labs (12/21): K 3.8, creatinine 0.83, hgb 9.5 Labs (1/22): K 3.8, creatinine 1.22, LDL 49 Labs (2/22): K 3.6, creatinine 0.86 Labs (10/22): K 3.7, creatinine 0.75, BNP 153  ECG  (personally reviewed): Atrial fibrillation  PMH: 1. Atrial fibrillation: Permanent.  2. CAD: CABG 2000.  - LHC (2014) with totally occluded RCA, totally occluded SVG-RCA, totally occluded SVG-OM, atretic LIMA, patent SVG-D.   - LHC (1/22): patent LIMA-LAD and SVG-D, occluded SVG-OM and occluded SVG-RCA, 60-70% calcified ostial RCA stenosis and 95% calcified proximal RCA stenosis, totally occluded PLV with collaterals (known from prior). 3. HTN 4. PAD: s/p bilateral fem-pop bypasses.   - Peripheral arterial dopplers (7/21): Occluded right fem-pop, patent left fem-pop.  5. Type 2 diabetes 6. Prostate cancer: Treated with radiation. 7. PVCs 8. History of renal artery stenosis.  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  10. Chronic diastolic CHF: Echo (60/10) with EF 60-65%, mildly decreased RV systolic function with moderate RV enlargement, severe biatrial enlargement, PASP 75 mmHg, dilated IVC.  V/Q scan 7/22 with no chronic PE.  - RHC (1/22): mean RA 6, PA 65/18 mean 35, mean PCWP 14, CI 2.71, PVR 3.7 WU.  11. 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  - Abdominal US (8/22): 4.8 cm AAA 12. Carotid stenosis: Carotid dopplers (9/32) with 35-57% LICA stenosis.   SH: Lives in [place] with wife.  Retired.  Remote smoker (>20 years ago). No ETOH.   Family History  Problem Relation Age of Onset   Deep vein thrombosis Father    Lung cancer Sister    Diabetes Sister    Heart disease Sister        After age 87   Hyperlipidemia Sister    Hypertension  Sister    Lung cancer Sister    Breast cancer Sister    Hypertension Mother    Diabetes Sister    Coronary artery disease Other        family hx of   Cancer Brother        "crab cancer"   Heart disease Brother    Heart attack Brother    Heart attack Daughter    Colon cancer Neg Hx    Stroke Neg Hx    ROS: All systems reviewed and negative except as per HPI.   Current Outpatient Medications  Medication Sig Dispense Refill    acetaminophen (TYLENOL) 325 MG tablet Take 2 tablets (650 mg total) by mouth every 6 (six) hours as needed for mild pain, fever or headache (or Fever >/= 101). 12 tablet 0   albuterol (VENTOLIN HFA) 108 (90 Base) MCG/ACT inhaler Inhale 2 puffs into the lungs every 6 (six) hours as needed for wheezing or shortness of breath. 1 each 2   amLODipine (NORVASC) 5 MG tablet Take 1 tablet (5 mg total) by mouth daily. For BP 30 tablet 4   aspirin 81 MG EC tablet Take 1 tablet (81 mg total) by mouth daily with breakfast. 30 tablet 3   atorvastatin (LIPITOR) 40 MG tablet Take 1 tablet (40 mg total) by mouth daily. 90 tablet 1   famotidine (PEPCID) 20 MG tablet One after supper 30 tablet 11   HYDROcodone-acetaminophen (NORCO/VICODIN) 5-325 MG tablet Take 1 tablet by mouth daily as needed for moderate pain.     ipratropium (ATROVENT) 0.03 % nasal spray Place 2 sprays into both nostrils as needed for rhinitis.     ipratropium-albuterol (DUONEB) 0.5-2.5 (3) MG/3ML SOLN Take 3 mLs by nebulization every 4 (four) hours as needed. 360 mL 1   isosorbide mononitrate (IMDUR) 60 MG 24 hr tablet Take 2 tablets (120 mg total) by mouth every evening. 90 tablet 6   losartan (COZAAR) 25 MG tablet Take 1 tablet (25 mg total) by mouth daily. 30 tablet 4   metFORMIN (GLUCOPHAGE) 1000 MG tablet Take 1,000 mg by mouth daily with breakfast.     metoprolol succinate (TOPROL-XL) 25 MG 24 hr tablet TAKE 1 TABLET EVERY DAY 30 tablet 0   nitroGLYCERIN (NITROSTAT) 0.4 MG SL tablet Place 1 tablet (0.4 mg total) under the tongue every 5 (five) minutes x 3 doses as needed for chest pain. 25 tablet 3   OXYGEN Inhale into the lungs as needed. 3 liters n/c     pantoprazole (PROTONIX) 40 MG tablet Take 1 tablet (40 mg total) by mouth daily. Take 30-60 min before first meal of the day     potassium chloride (KLOR-CON) 10 MEQ tablet Take 1 tablet (10 mEq total) by mouth daily. Only take while taking Torsemide 30 tablet 3   torsemide (DEMADEX) 10  MG tablet Take 1 tablet (10 mg total) by mouth every other day. Take 20 mg for weight gain over 3 lbs in 24 hours 30 tablet 3   warfarin (COUMADIN) 2 MG tablet Take 2 mg by mouth every evening. MANAGED BY PMD     No current facility-administered medications for this encounter.   BP 140/60    Pulse 75    Wt 93 kg (205 lb)    SpO2 98%    BMI 29.41 kg/m  General: NAD Neck: No JVD, no thyromegaly or thyroid nodule.  Lungs: Clear to auscultation bilaterally with normal respiratory effort. CV: Nondisplaced PMI.  Heart regular  S1/S2, no S3/S4, no murmur.  1+ ankle edema.  No carotid bruit.  Difficult to palpate pedal pulses.  Abdomen: Soft, nontender, no hepatosplenomegaly, no distention.  Skin: Intact without lesions or rashes.  Neurologic: Alert and oriented x 3.  Psych: Normal affect. Extremities: No clubbing or cyanosis.  HEENT: Normal.   Assessment/Plan: 1. Chronic diastolic CHF: Echo in 40/97 with EF 60-65%, mildly decreased RV systolic function with moderate RV enlargement, severe biatrial enlargement, PASP 75 mmHg, dilated IVC. After increasing diuretics, RHC in 1/22 showed normal filling pressures with moderate pulmonary hypertension. Weight is down 2 lbs and he does not look volume overloaded today. NYHA class II.  Jardiance stopped due to yeast infection.  - He has not been taking torsemide regularly => resume torsemide 10 mg qod with KCl 10 qod, BMET today.  - I will arrange for repeat echo to follow RV.  2. 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  Moderate PH on RHC in 1/22.  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  Less likely group 1 PH.  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  3. Chronic hypoxemic respiratory failure: Remote smoker.  Hilar and mediastinal lymphadenopathy on recent CT, no reported ILD.  Recurrent aspiration PNA in setting of chronic right vocal cord paralysis.  Was on home oxygen but able to stop after more effective diuresis, now just  uses oxygen at night.  - Should follow with pulmonary.  4. CAD: s/p CABG in 2000.  Cath in 1/22 showed patent LIMA-LAD and SVG-D but occluded SVG-OM and SVG-RCA.  There was 60-70% ostial RCA stenosis and 95% proximal RCA stenosis with heavy calcification.  Discussed with interventional, with normal EF plan was to proceed with initial medical management and proceed to PCI if chest pain worsened (PCI would require atherectomy). No chest pain recently.     - Continue ASA 81 and atorvastatin, check lipids today.  - Continue Imdur 60 mg daily and Toprol XL 25 mg daily for angina control.  5. Atrial fibrillation: Permanent.  - On warfarin.  - Continue Toprol XL 25 daily.  6. PAD: Occluded right fem-pop bypass.  No claudication.  - Follows with [facility]. 7. Carotid stenosis: 35-32% LICA stenosis on 9/92 dopplers, followed at [facility].  8. AAA: 4.8 cm AAA on 8/22 abdominal US, followed at [facility].   Followup in 4 months with NP/PA.   Loralie Champagne 04/17/2021

## 2021-05-02 DIAGNOSIS — I129 Hypertensive chronic kidney disease with stage 1 through stage 4 chronic kidney disease, or unspecified chronic kidney disease: Secondary | ICD-10-CM | POA: Diagnosis not present

## 2021-05-02 DIAGNOSIS — N182 Chronic kidney disease, stage 2 (mild): Secondary | ICD-10-CM | POA: Diagnosis not present

## 2021-05-02 DIAGNOSIS — E1122 Type 2 diabetes mellitus with diabetic chronic kidney disease: Secondary | ICD-10-CM | POA: Diagnosis not present

## 2021-05-13 ENCOUNTER — Other Ambulatory Visit: Payer: Self-pay | Admitting: Physician Assistant

## 2021-05-22 DIAGNOSIS — J0100 Acute maxillary sinusitis, unspecified: Secondary | ICD-10-CM | POA: Diagnosis not present

## 2021-05-22 DIAGNOSIS — E6609 Other obesity due to excess calories: Secondary | ICD-10-CM | POA: Diagnosis not present

## 2021-05-22 DIAGNOSIS — Z6831 Body mass index (BMI) 31.0-31.9, adult: Secondary | ICD-10-CM | POA: Diagnosis not present

## 2021-06-09 IMAGING — DX DG CHEST 2V
2 series · 2 of 2 positions shown · non-contrast
Comparison: Chest x-ray 03/26/2020.

CLINICAL DATA: 79-year-old male with history of shortness of breath
and cough. Dyspnea on exertion.

EXAM:
CHEST - 2 VIEW

[chest pa]
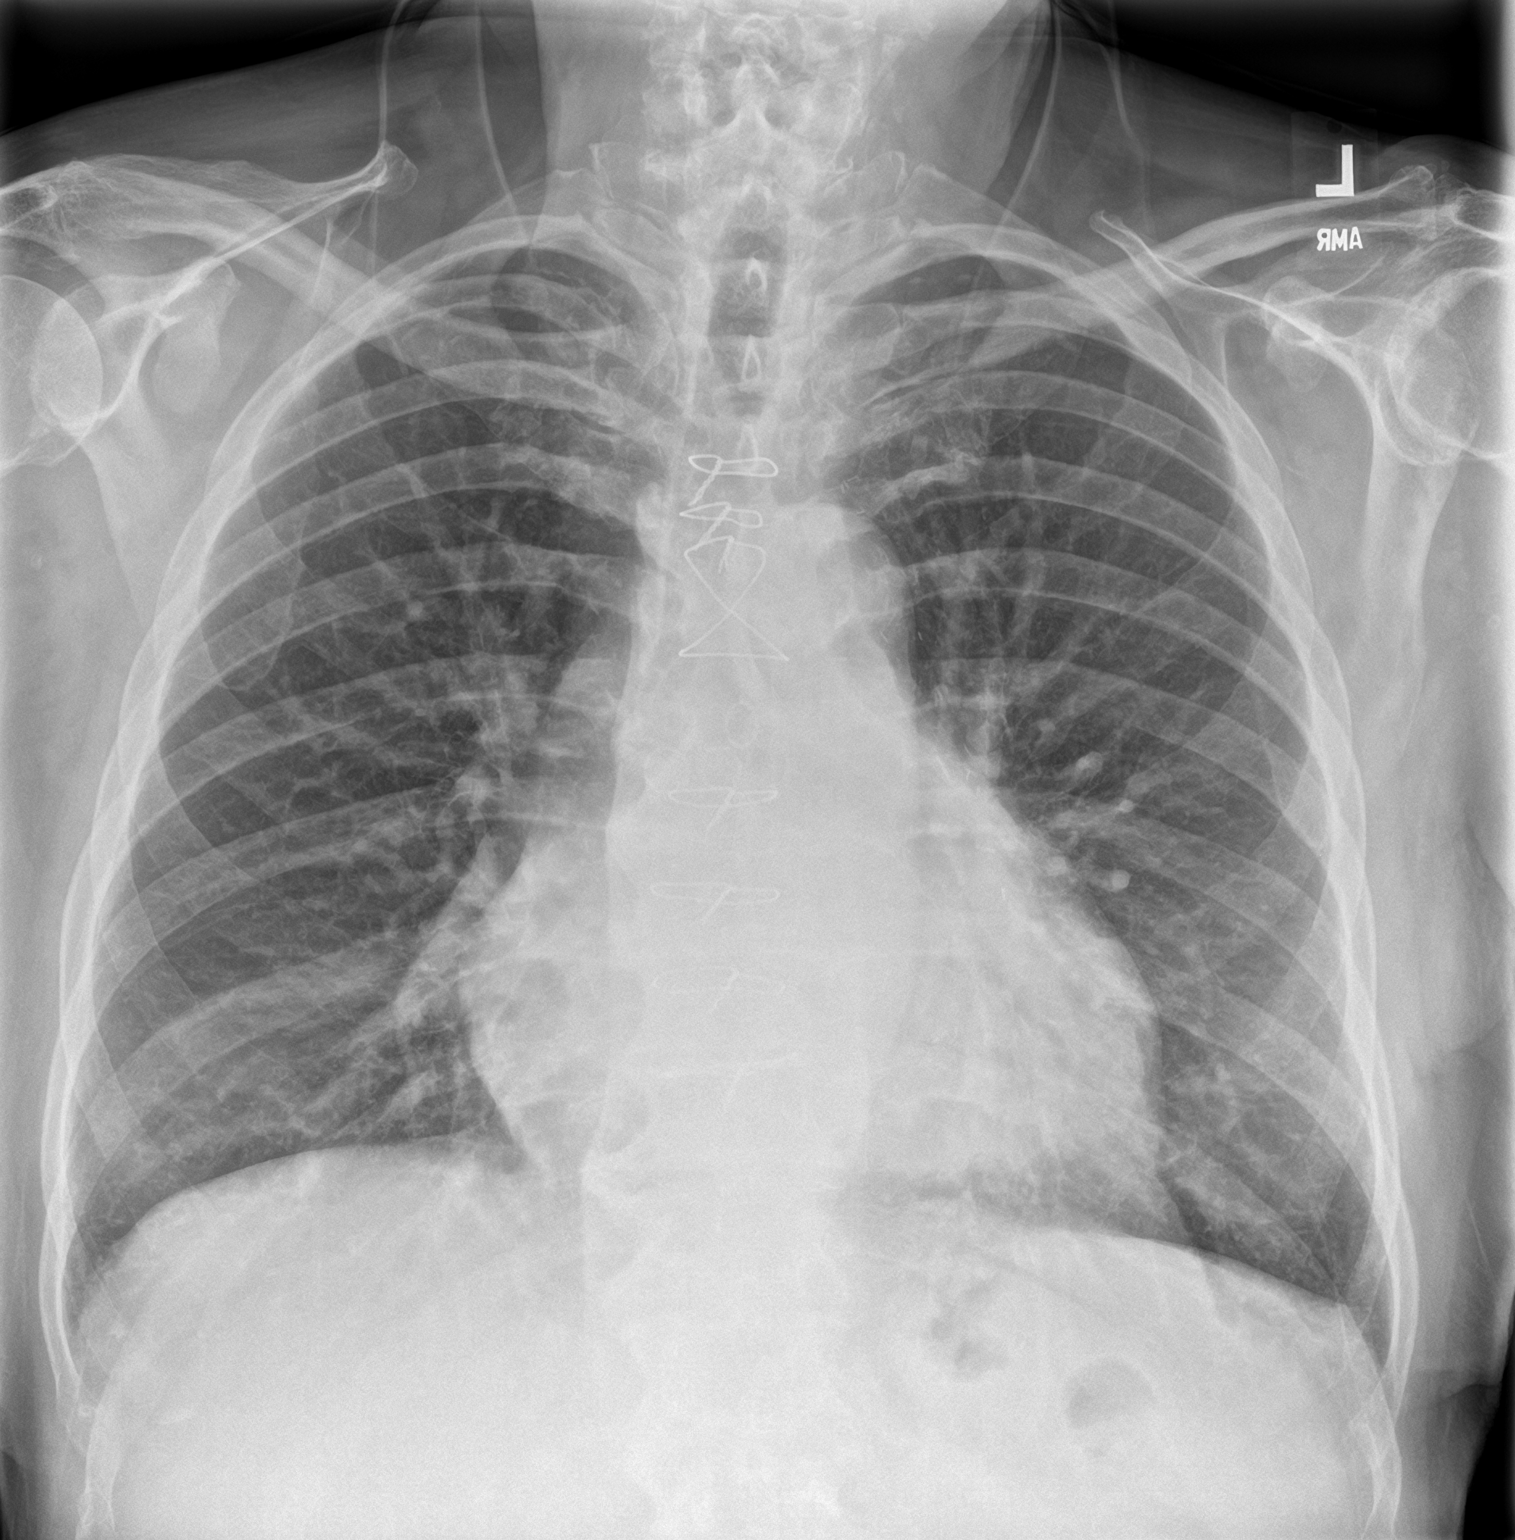

[chest lat]
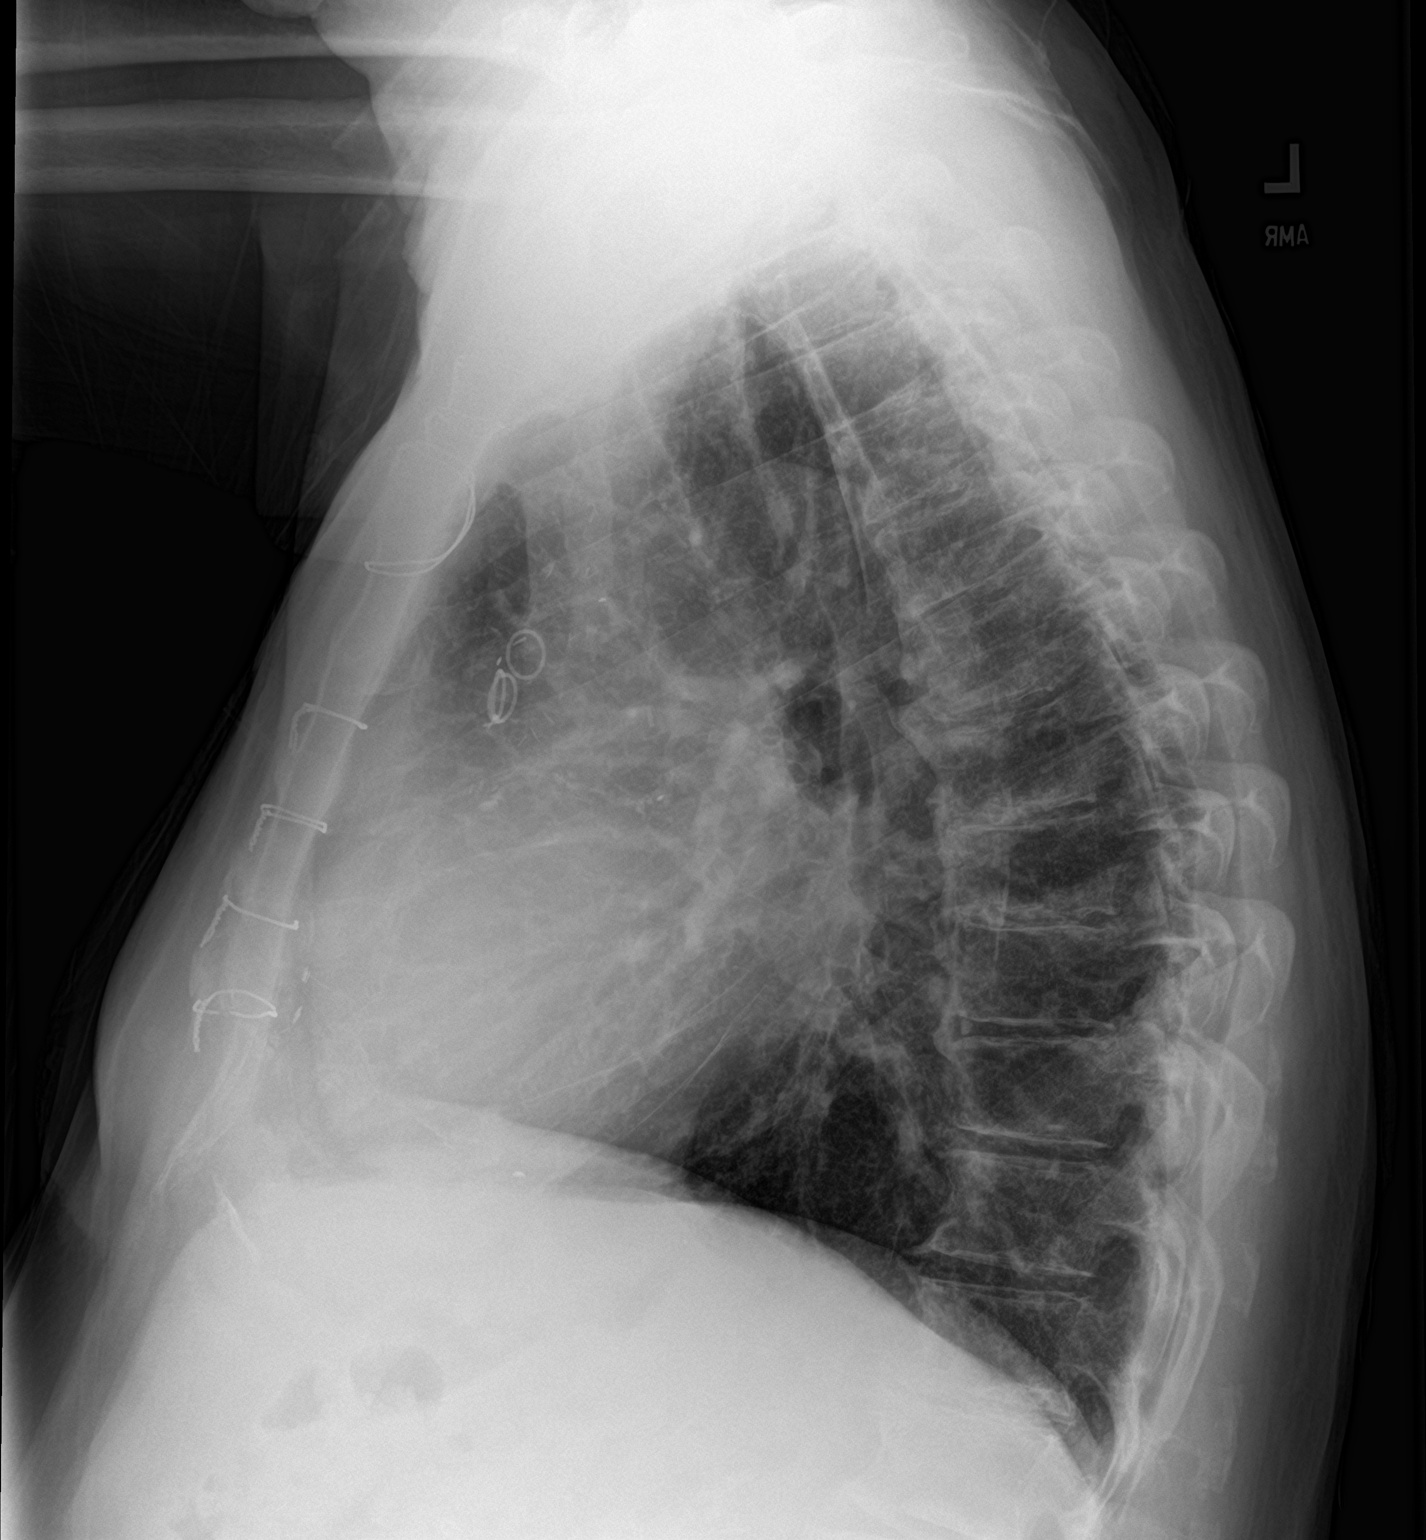

[2 of 2 positions shown; findings below may reference images not displayed]

FINDINGS: Lung volumes are normal. No consolidative airspace disease. No
pleural effusions. No pneumothorax. No pulmonary nodule or mass
noted. Pulmonary vasculature and the cardiomediastinal silhouette
are within normal limits. Atherosclerosis in the thoracic aorta.
Status post median sternotomy for CABG.
IMPRESSION: 1.  No radiographic evidence of acute cardiopulmonary disease.
2. Aortic atherosclerosis.

## 2021-07-13 DIAGNOSIS — Z79899 Other long term (current) drug therapy: Secondary | ICD-10-CM | POA: Diagnosis not present

## 2021-07-19 ENCOUNTER — Ambulatory Visit (HOSPITAL_COMMUNITY)
Admission: RE | Admit: 2021-07-19 | Discharge: 2021-07-19 | Disposition: A | Discharge status: Home / Self Care | Payer: Medicare HMO | Source: Ambulatory Visit | Attending: Cardiology | Admitting: Cardiology

## 2021-07-19 DIAGNOSIS — I5032 Chronic diastolic (congestive) heart failure: Secondary | ICD-10-CM | POA: Diagnosis not present

## 2021-07-19 LAB — ECHOCARDIOGRAM COMPLETE
AR max vel: 1.69 cm2
AV Area VTI: 1.61 cm2
AV Area mean vel: 1.81 cm2
AV Mean grad: 6 mmHg
AV Peak grad: 13 mmHg
Ao pk vel: 1.8 m/s
Area-P 1/2: 3.08 cm2
MV M vel: 5.69 m/s
MV Peak grad: 129.5 mmHg
P 1/2 time: 406 msec
S' Lateral: 3.6 cm

## 2021-07-19 NOTE — Progress Notes (Signed)
*  PRELIMINARY RESULTS* ?Echocardiogram ?2D Echocardiogram has been performed. ? ?Troy [name] ?07/19/2021, 11:18 AM ?

## 2021-07-24 DIAGNOSIS — R0902 Hypoxemia: Secondary | ICD-10-CM | POA: Diagnosis not present

## 2021-07-26 ENCOUNTER — Telehealth: Payer: Self-pay | Admitting: Internal Medicine

## 2021-07-26 DIAGNOSIS — G4734 Idiopathic sleep related nonobstructive alveolar hypoventilation: Secondary | ICD-10-CM

## 2021-07-26 NOTE — Telephone Encounter (Signed)
Yes, repeat on 2lpm  ?

## 2021-07-26 NOTE — Telephone Encounter (Signed)
Received ONO results from 07/21/21 done by [facility]  ?It was documented by Huey Romans that the test was done on 2lpm  ?Pt states he was not using any o2 during the test  ?Advised him he needs to be using o2 2lpm with sleep  ?Sending back to MW to see if we need to repeat ONO ?

## 2021-07-27 NOTE — Telephone Encounter (Signed)
ONO on 2lpm ordered ?

## 2021-08-02 DIAGNOSIS — Z7901 Long term (current) use of anticoagulants: Secondary | ICD-10-CM | POA: Diagnosis not present

## 2021-08-02 DIAGNOSIS — I4891 Unspecified atrial fibrillation: Secondary | ICD-10-CM | POA: Diagnosis not present

## 2021-08-02 DIAGNOSIS — Z1331 Encounter for screening for depression: Secondary | ICD-10-CM | POA: Diagnosis not present

## 2021-08-02 DIAGNOSIS — I700 Atherosclerosis of aorta: Secondary | ICD-10-CM | POA: Diagnosis not present

## 2021-08-02 DIAGNOSIS — Z0001 Encounter for general adult medical examination with abnormal findings: Secondary | ICD-10-CM | POA: Diagnosis not present

## 2021-08-02 DIAGNOSIS — E7849 Other hyperlipidemia: Secondary | ICD-10-CM | POA: Diagnosis not present

## 2021-08-02 DIAGNOSIS — I25708 Atherosclerosis of coronary artery bypass graft(s), unspecified, with other forms of angina pectoris: Secondary | ICD-10-CM | POA: Diagnosis not present

## 2021-08-02 DIAGNOSIS — E1122 Type 2 diabetes mellitus with diabetic chronic kidney disease: Secondary | ICD-10-CM | POA: Diagnosis not present

## 2021-08-02 DIAGNOSIS — I10 Essential (primary) hypertension: Secondary | ICD-10-CM | POA: Diagnosis not present

## 2021-08-02 DIAGNOSIS — E782 Mixed hyperlipidemia: Secondary | ICD-10-CM | POA: Diagnosis not present

## 2021-08-02 DIAGNOSIS — Z6831 Body mass index (BMI) 31.0-31.9, adult: Secondary | ICD-10-CM | POA: Diagnosis not present

## 2021-08-02 DIAGNOSIS — E118 Type 2 diabetes mellitus with unspecified complications: Secondary | ICD-10-CM | POA: Diagnosis not present

## 2021-08-10 DIAGNOSIS — R310 Gross hematuria: Secondary | ICD-10-CM | POA: Diagnosis not present

## 2021-08-10 DIAGNOSIS — Z6831 Body mass index (BMI) 31.0-31.9, adult: Secondary | ICD-10-CM | POA: Diagnosis not present

## 2021-08-10 DIAGNOSIS — E6609 Other obesity due to excess calories: Secondary | ICD-10-CM | POA: Diagnosis not present

## 2021-08-10 DIAGNOSIS — Z7901 Long term (current) use of anticoagulants: Secondary | ICD-10-CM | POA: Diagnosis not present

## 2021-08-11 ENCOUNTER — Ambulatory Visit (INDEPENDENT_AMBULATORY_CARE_PROVIDER_SITE_OTHER): Payer: Medicare HMO | Admitting: Urology

## 2021-08-11 ENCOUNTER — Encounter: Payer: Self-pay | Admitting: Urology

## 2021-08-11 VITALS — BP 149/76 | HR 64

## 2021-08-11 DIAGNOSIS — R310 Gross hematuria: Secondary | ICD-10-CM | POA: Diagnosis not present

## 2021-08-11 DIAGNOSIS — Z87442 Personal history of urinary calculi: Secondary | ICD-10-CM

## 2021-08-11 LAB — MICROSCOPIC EXAMINATION
Bacteria, UA: NONE SEEN
Epithelial Cells (non renal): NONE SEEN /hpf (ref 0–10)
RBC, Urine: >30 /hpf — AB (ref 0–2)
Renal Epithel, UA: NONE SEEN /hpf
WBC, UA: NONE SEEN /hpf (ref 0–5)

## 2021-08-11 LAB — URINALYSIS, ROUTINE W REFLEX MICROSCOPIC
Bilirubin, UA: NEGATIVE
Glucose, UA: NEGATIVE
Ketones, UA: NEGATIVE
Leukocytes,UA: NEGATIVE
Nitrite, UA: NEGATIVE
Specific Gravity, UA: 1.015 (ref 1.005–1.030)
Urobilinogen, Ur: 1 mg/dL (ref 0.2–1.0)
pH, UA: 8.5 — ABNORMAL HIGH (ref 5.0–7.5)

## 2021-08-11 NOTE — Progress Notes (Signed)
? ?08/11/2021 ?9:29 AM  ? ?Troy [name] ?[birth year] ?[id number] ? ?Referring provider: Sharilyn Sites, MD ?[address] ?[place],  [place] [address] ? ?Gross hematuria ? ? ?HPI: ?Troy [name] is a 81yo here for evaluation for gross hematuria. Start 1 week ago he noted gross painless hematuria. He has a hx of prostate cancer and had EBRT 20 years ago. No hx of nephrolithiasis. No significant LUTS. IPSS 9 QOL 2. He has a 70pk year smoking history. NO flank pain. He is on coumadin.  ? ? ?PMH: ?Past Medical History:  ?Diagnosis Date  ? AAA (abdominal aortic aneurysm)   ? Followed by Dr. Sherren Mocha Early  ? Arthritis   ? CAD (coronary artery disease)   ? a. CABG 2000.  ? Cancer [facility])   ? Prostate:  Radiation Tx  ? Carotid artery disease ([place])   ? Chest pain   ? precordial. mild chronic .[name]... nonischemic  ? CHF (congestive heart failure) ([place])   ? Chronic edema   ? Coronary artery disease   ? a. Nuclear, January, 2008, no ischemia b. Cath 08/2012- 1/4 patent grafts, RCA CTO, no flow-limiting disease, medically managed  ? Diabetes mellitus without complication ([place])   ? Dizziness 02/2011  ? Dyslipidemia   ? Fall   ? GERD (gastroesophageal reflux disease)   ? TAKES TUMS & ROLAIDS AS NEEDED  ? Hx of CABG 2000  ? Hypertension   ? Myocardial infarction [facility]) 1999  ? Neck pain 02/2011  ? Pneumonia   ? Pulmonary hypertension ([place])   ? Renal artery stenosis ([place])   ? 50-70%  ? S/P femoropopliteal bypass surgery   ? Dr. Donnetta Hutching  ? Sinus bradycardia   ? Asymptomatic  ? ? ?Surgical History: ?Past Surgical History:  ?Procedure Laterality Date  ? CARDIAC CATHETERIZATION  09/26/2012  ? 1/4 patent bypass (occluded SVG-PDA, SVG-OM, LIMA-LAD), SVG-diagonal patent and fills the diagonal and LAD, distal RCA occlusion with left to right collateralization, patent circumflex, LAD with no flow-limiting disease and antegrade flow competitively from SVG-diagonal; EF 60-65%  ? COLONOSCOPY  11/30/2009  ? [id number]. next TCS 11/2019  ? COLONOSCOPY  WITH PROPOFOL N/A 12/18/2019  ? Procedure: COLONOSCOPY WITH PROPOFOL;  Surgeon: Harvel Quale, MD;  [facility];  Service: Gastroenterology;  Laterality: N/A;  1030  ? CORONARY ARTERY BYPASS GRAFT  2000  ? ESOPHAGOGASTRODUODENOSCOPY N/A 08/12/2013  ? [id number] peptic stricture with erosive refluxesophagitis - status post [place] dilation. Hiatal hernia. Abnormalgastric mucosa. Deformity of the pyloric channel suggestive ofprior peptic ulcer disease. Duodenal bulbar diverticulum Statuspost gastric biopsy. h.pylori  ? LEFT HEART CATHETERIZATION WITH CORONARY/GRAFT ANGIOGRAM N/A 09/26/2012  ? Procedure: LEFT HEART CATHETERIZATION WITH Beatrix Fetters;  Surgeon: Burnell Blanks, MD;  Location: [facility] CATH LAB;  Service: Cardiovascular;  Laterality: N/A;  ? MALONEY DILATION N/A 08/12/2013  ? Procedure: MALONEY DILATION;  Surgeon: Daneil Dolin, MD;  Location: [facility];  Service: Endoscopy;  Laterality: N/A;  ? POLYPECTOMY  12/18/2019  ? Procedure: POLYPECTOMY;  Surgeon: Harvel Quale, MD;  Location: [facility];  Service: Gastroenterology;;  ascending colon polyp   ? PR VEIN BYPASS GRAFT,AORTO-FEM-POP Right 07/19/1999  ? PR VEIN BYPASS GRAFT,AORTO-FEM-POP Left 05/02/2006  ? RIGHT HEART CATH AND CORONARY/GRAFT ANGIOGRAPHY N/A 04/11/2020  ? Procedure: RIGHT HEART CATH AND CORONARY/GRAFT ANGIOGRAPHY;  Surgeon: Larey Dresser, MD;  Location: [place] CV LAB;  Service: Cardiovascular;  Laterality: N/A;  ? SAVORY DILATION N/A 08/12/2013  ? Procedure: SAVORY DILATION;  Surgeon:  Daneil Dolin, MD;  [facility];  Service: Endoscopy;  Laterality: N/A;  ? WRIST SURGERY    ? cyst removal  ? ? ?Home Medications:  ?Allergies as of 08/11/2021   ? ?   Reactions  ? Niacin Itching, Rash  ? Burning sensation  ? Contrast Media [iodinated Contrast Media] Nausea And Vomiting  ? Iodine-131 Nausea And Vomiting  ? Nsaids Other (See Comments)  ? Taking Coumadin  ? ?  ? ?   ?Medication List  ?  ? ?  ? Accurate as of Aug 11, 2021  9:29 AM. If you have any questions, ask your nurse or doctor.  ?  ?  ? ?  ? ?acetaminophen 325 MG tablet ?Commonly known as: TYLENOL ?Take 2 tablets (650 mg total) by mouth every 6 (six) hours as needed for mild pain, fever or headache (or Fever >/= 101). ?  ?albuterol 108 (90 Base) MCG/ACT inhaler ?Commonly known as: VENTOLIN HFA ?Inhale 2 puffs into the lungs every 6 (six) hours as needed for wheezing or shortness of breath. ?  ?amLODipine 5 MG tablet ?Commonly known as: NORVASC ?Take 1 tablet (5 mg total) by mouth daily. For BP ?  ?amLODipine 10 MG tablet ?Commonly known as: NORVASC ?Take 10 mg by mouth daily. ?  ?aspirin 81 MG EC tablet ?Take 1 tablet (81 mg total) by mouth daily with breakfast. ?  ?atorvastatin 40 MG tablet ?Commonly known as: LIPITOR ?Take 1 tablet (40 mg total) by mouth daily. ?  ?famotidine 20 MG tablet ?Commonly known as: Pepcid ?One after supper ?  ?HYDROcodone-acetaminophen 5-325 MG tablet ?Commonly known as: NORCO/VICODIN ?Take 1 tablet by mouth daily as needed for moderate pain. ?  ?ipratropium 0.03 % nasal spray ?Commonly known as: ATROVENT ?Place 2 sprays into both nostrils as needed for rhinitis. ?  ?ipratropium-albuterol 0.5-2.5 (3) MG/3ML Soln ?Commonly known as: DUONEB ?Take 3 mLs by nebulization every 4 (four) hours as needed. ?  ?isosorbide mononitrate 60 MG 24 hr tablet ?Commonly known as: IMDUR ?Take 2 tablets (120 mg total) by mouth every evening. ?  ?losartan 25 MG tablet ?Commonly known as: COZAAR ?Take 1 tablet (25 mg total) by mouth daily. ?  ?metFORMIN 1000 MG tablet ?Commonly known as: GLUCOPHAGE ?Take 1,000 mg by mouth daily with breakfast. ?  ?metoprolol succinate 25 MG 24 hr tablet ?Commonly known as: TOPROL-XL ?Take 1 tablet (25 mg total) by mouth daily. Pt. Needs to make an appt. With Cardiologist in order to receive further refills. Thank You. 1st Attempt. ?  ?nitroGLYCERIN 0.4 MG SL tablet ?Commonly known  as: NITROSTAT ?Place 1 tablet (0.4 mg total) under the tongue every 5 (five) minutes x 3 doses as needed for chest pain. ?  ?OXYGEN ?Inhale into the lungs as needed. 3 liters n/c ?  ?pantoprazole 40 MG tablet ?Commonly known as: PROTONIX ?Take 1 tablet (40 mg total) by mouth daily. Take 30-60 min before first meal of the day ?  ?potassium chloride 10 MEQ tablet ?Commonly known as: KLOR-CON M ?Take 1 tablet (10 mEq total) by mouth daily. Only take while taking Torsemide ?  ?torsemide 10 MG tablet ?Commonly known as: DEMADEX ?Take 1 tablet (10 mg total) by mouth every other day. Take 20 mg for weight gain over 3 lbs in 24 hours ?  ?warfarin 2 MG tablet ?Commonly known as: COUMADIN ?Take 2 mg by mouth every evening. MANAGED BY PMD ?  ? ?  ? ? ?Allergies:  ?Allergies  ?Allergen  Reactions  ? Niacin Itching and Rash  ?  Burning sensation  ? Contrast Media [Iodinated Contrast Media] Nausea And Vomiting  ? Iodine-131 Nausea And Vomiting  ? Nsaids Other (See Comments)  ?  Taking Coumadin  ? ? ?Family History: ?Family History  ?Problem Relation Age of Onset  ? Deep vein thrombosis Father   ? Lung cancer Sister   ? Diabetes Sister   ? Heart disease Sister   ?     After age 91  ? Hyperlipidemia Sister   ? Hypertension Sister   ? Lung cancer Sister   ? Breast cancer Sister   ? Hypertension Mother   ? Diabetes Sister   ? Coronary artery disease Other   ?     family hx of  ? Cancer Brother   ?     "crab cancer"  ? Heart disease Brother   ? Heart attack Brother   ? Heart attack Daughter   ? Colon cancer Neg Hx   ? Stroke Neg Hx   ? ? ?Social History:  reports that he quit smoking about 3 years ago. His smoking use included cigarettes. He has a 140.00 pack-year smoking history. He has never used smokeless tobacco. He reports current alcohol use. He reports that he does not use drugs. ? ?ROS: ?All other review of systems were reviewed and are negative except what is noted above in HPI ? ?Physical Exam: ?BP (!) 149/76   Pulse 64    ?Constitutional:  Alert and oriented, No acute distress. ?HEENT: [place] AT, moist mucus membranes.  Trachea midline, no masses. ?Cardiovascular: No clubbing, cyanosis, or edema. ?Respiratory: Normal respiratory

## 2021-08-14 DIAGNOSIS — Z79899 Other long term (current) drug therapy: Secondary | ICD-10-CM | POA: Diagnosis not present

## 2021-08-14 NOTE — Progress Notes (Signed)
PCP: Sharilyn Sites, MD ?HF Cardiology: Dr. Aundra Dubin ? ?81 y.o. with history of permanent atrial fibrillation, CAD s/p CABG 2000, PAD, chronic diastolic CHF was referred to CHF clinic by Melina Copa, PA, for evaluation of CHF and pulmonary hypertension.  Patient had CABG in 2000, last cath was in 2014 showing occluded SVG-OM, occluded SVG-PDA, occluded RCA, and atretic LIMA.  SVG-D was patent and native LAD was patent.  Patient had bilateral fem-pop bypasses, peripheral dopplers in 7/21 showed that the right fem-pop was occluded.  He is in permanent atrial fibrillation on warfarin. Echo in 10/21 showed normal LV systolic function with moderately dilated/mildly dysfunctional RV and PASP 75 mmHg.  In 12/21, he was admitted with PNA and treated with antibiotics.  CTA chest showed no PE, RML PNA, and bilateral hilar + mediastinal lymphadenopathy.  He was sent home from this admission on home oxygen.  ? ?In 1/22, RHC/LHC was done.  This showed patent LIMA-LAD and SVG-D, occluded SVG-OM and occluded SVG-RCA, 95% calcified proximal RCA stenosis, totally occluded PLV with collaterals (known from prior).  There was moderate pulmonary hypertension with optimized left and right heart filling pressures.  I reviewed the cath with interventional cardiology, would require atherectomy to treat the RCA.  We decided to manage medically initially.  ? ?He stopped Jardiance due to penile yeast infection.  ? ?Follow up 1/23. Torsemide 10 mg daily resumed, echo arranged to follow RV.  ? ?Echo 4/23 showed EF 55-60%, mild LVH, RV mildly reduced. ? ?Today he returns for HF follow up. Overall feeling fine. He has mild dyspnea when walking further distances on flat ground. He uses a walker when ambulating. Wears oxygen at night. Rare atypical chest pain. Denies palpitations, CP, dizziness, edema, or PND/Orthopnea. Appetite ok. No fever or chills. Weight at home 199-205 pounds. Taking all medications. Being followed by Urology now with recent  hematuria. Planning for CT and cystoscopy soon. ? ? ?Labs (12/21): K 3.8, creatinine 0.83, hgb 9.5 ?Labs (1/22): K 3.8, creatinine 1.22, LDL 49 ?Labs (2/22): K 3.6, creatinine 0.86 ?Labs (10/22): K 3.7, creatinine 0.75, BNP 153 ?Labs (1/23): LDL 62, HDL 37 ? ?ECG (personally reviewed): none ordered today. ? ?PMH: ?1. Atrial fibrillation: Permanent.  ?2. CAD: CABG 2000.  ?- LHC (2014) with totally occluded RCA, totally occluded SVG-RCA, totally occluded SVG-OM, atretic LIMA, patent SVG-D.   ?- LHC (1/22): patent LIMA-LAD and SVG-D, occluded SVG-OM and occluded SVG-RCA, 60-70% calcified ostial RCA stenosis and 95% calcified proximal RCA stenosis, totally occluded PLV with collaterals (known from prior). ?3. HTN ?4. PAD: s/p bilateral fem-pop bypasses.   ?- Peripheral arterial dopplers (7/21): Occluded right fem-pop, patent left fem-pop.  ?5. Type 2 diabetes ?6. Prostate cancer: Treated with radiation. ?7. PVCs ?8. History of renal artery stenosis.  ?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  ?10. Chronic diastolic CHF: Echo (91/47) with EF 60-65%, mildly decreased RV systolic function with moderate RV enlargement, severe biatrial enlargement, PASP 75 mmHg, dilated IVC.  V/Q scan 7/22 with no chronic PE.  ?- RHC (1/22): mean RA 6, PA 65/18 mean 35, mean PCWP 14, CI 2.71, PVR 3.7 WU.  ?- Echo (4/23): EF 55-60%, mild LVH, RV mildly reduced, severely elevated pulmonary artery systolic pressure ?11. 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  ?- Abdominal US (8/22): 4.8 cm AAA ?12. Carotid stenosis: Carotid dopplers (8/29) with 56-21% LICA stenosis.  ? ?SH: Lives in [place] with wife.  Retired.  Remote smoker (>20 years ago). No ETOH.  ? ?  Family History  ?Problem Relation Age of Onset  ? Deep vein thrombosis Father   ? Lung cancer Sister   ? Diabetes Sister   ? Heart disease Sister   ?     After age 84  ? Hyperlipidemia Sister   ? Hypertension Sister   ? Lung cancer Sister   ? Breast cancer Sister   ? Hypertension  Mother   ? Diabetes Sister   ? Coronary artery disease Other   ?     family hx of  ? Cancer Brother   ?     "crab cancer"  ? Heart disease Brother   ? Heart attack Brother   ? Heart attack Daughter   ? Colon cancer Neg Hx   ? Stroke Neg Hx   ? ?ROS: All systems reviewed and negative except as per HPI.  ? ?Current Outpatient Medications  ?Medication Sig Dispense Refill  ? acetaminophen (TYLENOL) 325 MG tablet Take 2 tablets (650 mg total) by mouth every 6 (six) hours as needed for mild pain, fever or headache (or Fever >/= 101). 12 tablet 0  ? albuterol (VENTOLIN HFA) 108 (90 Base) MCG/ACT inhaler Inhale 2 puffs into the lungs every 6 (six) hours as needed for wheezing or shortness of breath. 1 each 2  ? amLODipine (NORVASC) 5 MG tablet Take 1 tablet (5 mg total) by mouth daily. For BP 30 tablet 4  ? aspirin 81 MG EC tablet Take 1 tablet (81 mg total) by mouth daily with breakfast. 30 tablet 3  ? atorvastatin (LIPITOR) 40 MG tablet Take 1 tablet (40 mg total) by mouth daily. 90 tablet 1  ? famotidine (PEPCID) 20 MG tablet One after supper 30 tablet 11  ? HYDROcodone-acetaminophen (NORCO/VICODIN) 5-325 MG tablet Take 1 tablet by mouth daily as needed for moderate pain.    ? ipratropium (ATROVENT) 0.03 % nasal spray Place 2 sprays into both nostrils as needed for rhinitis.    ? ipratropium-albuterol (DUONEB) 0.5-2.5 (3) MG/3ML SOLN Take 3 mLs by nebulization every 4 (four) hours as needed. 360 mL 1  ? isosorbide mononitrate (IMDUR) 60 MG 24 hr tablet Take 2 tablets (120 mg total) by mouth every evening. 90 tablet 6  ? losartan (COZAAR) 25 MG tablet Take 1 tablet (25 mg total) by mouth daily. 30 tablet 4  ? metFORMIN (GLUCOPHAGE) 1000 MG tablet Take 1,000 mg by mouth daily with breakfast.    ? metoprolol succinate (TOPROL-XL) 25 MG 24 hr tablet Take 1 tablet (25 mg total) by mouth daily. Pt. Needs to make an appt. With Cardiologist in order to receive further refills. Thank You. 1st Attempt. 30 tablet 0  ? nitroGLYCERIN  (NITROSTAT) 0.4 MG SL tablet Place 1 tablet (0.4 mg total) under the tongue every 5 (five) minutes x 3 doses as needed for chest pain. 25 tablet 3  ? OXYGEN Inhale into the lungs as needed. 3 liters n/c    ? pantoprazole (PROTONIX) 40 MG tablet Take 1 tablet (40 mg total) by mouth daily. Take 30-60 min before first meal of the day    ? potassium chloride (KLOR-CON) 10 MEQ tablet Take 1 tablet (10 mEq total) by mouth daily. Only take while taking Torsemide 30 tablet 3  ? torsemide (DEMADEX) 10 MG tablet Take 1 tablet (10 mg total) by mouth every other day. Take 20 mg for weight gain over 3 lbs in 24 hours 30 tablet 3  ? warfarin (COUMADIN) 2 MG tablet Take 2 mg by mouth  every evening. MANAGED BY PMD    ? ?No current facility-administered medications for this encounter.  ? ?Wt Readings from Last 3 Encounters:  ?08/15/21 92.8 kg (204 lb 9.6 oz)  ?04/17/21 93 kg (205 lb)  ?02/02/21 94.2 kg (207 lb 9.6 oz)  ? ?BP (!) 150/66   Pulse (!) 53   Wt 92.8 kg (204 lb 9.6 oz)   SpO2 95%   BMI 29.36 kg/m?  ?General:  NAD. No resp difficulty, walked into clinic with rolling walker. ?HEENT: Normal ?Neck: Supple. No JVD. Carotids 2+ bilat; no bruits. No lymphadenopathy or thryomegaly appreciated. ?Cor: PMI nondisplaced. Irregular rate & rhythm. No rubs, gallops or murmurs. ?Lungs: Clear ?Abdomen: Soft, nontender, nondistended. No hepatosplenomegaly. No bruits or masses. Good bowel sounds. ?Extremities: No cyanosis, clubbing, rash, edema ?Neuro: Alert & oriented x 3, cranial nerves grossly intact. Moves all 4 extremities w/o difficulty. Affect pleasant. ? ?Assessment/Plan: ?1. Chronic diastolic CHF: Echo in 14/97 with EF 60-65%, mildly decreased RV systolic function with moderate RV enlargement, severe biatrial enlargement, PASP 75 mmHg, dilated IVC. After increasing diuretics, RHC in 1/22 showed normal filling pressures with moderate pulmonary hypertension. Echo 4/23 with EF 60-65%, mildly enlarged RV with mildly decreased RV  function. NYHA class II. He is not volume overloaded today.  ?- Continue torsemide 10 mg qod with KCl 10 qod. Will request labs from PCP visit last week. ?- Continue losartan 25 mg daily. ?- Continue Toprol XL 25

## 2021-08-15 ENCOUNTER — Ambulatory Visit (HOSPITAL_COMMUNITY)
Admission: RE | Admit: 2021-08-15 | Discharge: 2021-08-15 | Disposition: A | Discharge status: Home / Self Care | Payer: Medicare HMO | Source: Ambulatory Visit | Attending: Family Medicine | Admitting: Family Medicine

## 2021-08-15 ENCOUNTER — Encounter (HOSPITAL_COMMUNITY): Payer: Self-pay

## 2021-08-15 VITALS — BP 150/66 | HR 53 | Wt 204.6 lb

## 2021-08-15 DIAGNOSIS — Z951 Presence of aortocoronary bypass graft: Secondary | ICD-10-CM | POA: Insufficient documentation

## 2021-08-15 DIAGNOSIS — I739 Peripheral vascular disease, unspecified: Secondary | ICD-10-CM | POA: Diagnosis not present

## 2021-08-15 DIAGNOSIS — Z8546 Personal history of malignant neoplasm of prostate: Secondary | ICD-10-CM | POA: Insufficient documentation

## 2021-08-15 DIAGNOSIS — R591 Generalized enlarged lymph nodes: Secondary | ICD-10-CM | POA: Diagnosis not present

## 2021-08-15 DIAGNOSIS — Z79899 Other long term (current) drug therapy: Secondary | ICD-10-CM | POA: Diagnosis not present

## 2021-08-15 DIAGNOSIS — I110 Hypertensive heart disease with heart failure: Secondary | ICD-10-CM | POA: Diagnosis not present

## 2021-08-15 DIAGNOSIS — I272 Pulmonary hypertension, unspecified: Secondary | ICD-10-CM | POA: Diagnosis not present

## 2021-08-15 DIAGNOSIS — I4821 Permanent atrial fibrillation: Secondary | ICD-10-CM | POA: Diagnosis not present

## 2021-08-15 DIAGNOSIS — I2510 Atherosclerotic heart disease of native coronary artery without angina pectoris: Secondary | ICD-10-CM

## 2021-08-15 DIAGNOSIS — I6529 Occlusion and stenosis of unspecified carotid artery: Secondary | ICD-10-CM | POA: Diagnosis not present

## 2021-08-15 DIAGNOSIS — R319 Hematuria, unspecified: Secondary | ICD-10-CM

## 2021-08-15 DIAGNOSIS — Z7901 Long term (current) use of anticoagulants: Secondary | ICD-10-CM | POA: Insufficient documentation

## 2021-08-15 DIAGNOSIS — I5032 Chronic diastolic (congestive) heart failure: Secondary | ICD-10-CM | POA: Diagnosis not present

## 2021-08-15 DIAGNOSIS — Z7960 Long term (current) use of unspecified immunomodulators and immunosuppressants: Secondary | ICD-10-CM | POA: Insufficient documentation

## 2021-08-15 DIAGNOSIS — Z7982 Long term (current) use of aspirin: Secondary | ICD-10-CM | POA: Insufficient documentation

## 2021-08-15 DIAGNOSIS — Z7984 Long term (current) use of oral hypoglycemic drugs: Secondary | ICD-10-CM | POA: Diagnosis not present

## 2021-08-15 DIAGNOSIS — I714 Abdominal aortic aneurysm, without rupture, unspecified: Secondary | ICD-10-CM | POA: Diagnosis not present

## 2021-08-15 DIAGNOSIS — I6521 Occlusion and stenosis of right carotid artery: Secondary | ICD-10-CM | POA: Diagnosis not present

## 2021-08-15 DIAGNOSIS — J9611 Chronic respiratory failure with hypoxia: Secondary | ICD-10-CM | POA: Diagnosis not present

## 2021-08-15 DIAGNOSIS — Z9981 Dependence on supplemental oxygen: Secondary | ICD-10-CM | POA: Diagnosis not present

## 2021-08-15 NOTE — Patient Instructions (Signed)
Thank you for coming in today ? ?No medication changes today ? ?Your physician recommends that you schedule a follow-up appointment in:  ?4 months with Dr. Aundra Dubin ? ?At the [facility], you and your health needs are our priority. As part of our continuing mission to provide you with exceptional heart care, we have created designated Provider Care Teams. These Care Teams include your primary Cardiologist (physician) and Advanced Practice Providers (APPs- Physician Assistants and Nurse Practitioners) who all work together to provide you with the care you need, when you need it.  ? ?You may see any of the following providers on your designated Care Team at your next follow up: ?Dr Glori Bickers ?Dr Loralie Champagne ?Darrick Grinder, NP ?Lyda Jester, PA ?Jessica Milford,NP ?Marlyce Huge, PA ?Audry Riles, PharmD ? ? ?Please be sure to bring in all your medications bottles to every appointment.  ? ?If you have any questions or concerns before your next appointment please send [place] a message through [place] or call our office at [phone].   ? ?TO LEAVE A MESSAGE FOR THE NURSE SELECT OPTION 2, PLEASE LEAVE A MESSAGE INCLUDING: ?YOUR NAME ?DATE OF BIRTH ?CALL BACK NUMBER ?REASON FOR CALL**this is important as we prioritize the call backs ? ?YOU WILL RECEIVE A CALL BACK THE SAME DAY AS LONG AS YOU CALL BEFORE 4:00 PM ?

## 2021-08-18 ENCOUNTER — Ambulatory Visit (HOSPITAL_COMMUNITY)
Admission: RE | Admit: 2021-08-18 | Discharge: 2021-08-18 | Disposition: A | Discharge status: Home / Self Care | Payer: Medicare HMO | Source: Ambulatory Visit | Attending: Urology | Admitting: Urology

## 2021-08-18 ENCOUNTER — Telehealth (HOSPITAL_COMMUNITY): Payer: Self-pay | Admitting: Cardiology

## 2021-08-18 DIAGNOSIS — Z79899 Other long term (current) drug therapy: Secondary | ICD-10-CM | POA: Diagnosis not present

## 2021-08-18 DIAGNOSIS — I7143 Infrarenal abdominal aortic aneurysm, without rupture: Secondary | ICD-10-CM | POA: Diagnosis not present

## 2021-08-18 DIAGNOSIS — N281 Cyst of kidney, acquired: Secondary | ICD-10-CM | POA: Diagnosis not present

## 2021-08-18 DIAGNOSIS — R310 Gross hematuria: Secondary | ICD-10-CM | POA: Insufficient documentation

## 2021-08-18 DIAGNOSIS — K4090 Unilateral inguinal hernia, without obstruction or gangrene, not specified as recurrent: Secondary | ICD-10-CM | POA: Diagnosis not present

## 2021-08-18 DIAGNOSIS — I700 Atherosclerosis of aorta: Secondary | ICD-10-CM | POA: Diagnosis not present

## 2021-08-18 MED ORDER — IOHEXOL 300 MG/ML  SOLN
100.0000 mL | Freq: Once | INTRAMUSCULAR | Status: AC | PRN
  Administered 2021-08-18: 100 mL via INTRAVENOUS

## 2021-08-18 NOTE — Telephone Encounter (Addendum)
Labs received from PCP Labs drawn 08/10/21 CBC and PT/INR  Per Allena Katz will need bmet in 2 weeks  Detailed message left for patient follow up labs are needed-can return call to schedule with office or take ard copy mailed to patient to local [facility].    5/24 [facility]

## 2021-08-21 ENCOUNTER — Telehealth: Payer: Self-pay | Admitting: Internal Medicine

## 2021-08-21 DIAGNOSIS — G4734 Idiopathic sleep related nonobstructive alveolar hypoventilation: Secondary | ICD-10-CM

## 2021-08-21 NOTE — Telephone Encounter (Signed)
Received ONO on 2lpm results  Per MW- needs to be repeated on 3lpm  Pt aware and test was ordered

## 2021-08-22 LAB — POCT I-STAT CREATININE: Creatinine, Ser: 1 mg/dL (ref 0.61–1.24)

## 2021-08-23 ENCOUNTER — Encounter: Payer: Self-pay | Admitting: Urology

## 2021-08-23 ENCOUNTER — Ambulatory Visit (INDEPENDENT_AMBULATORY_CARE_PROVIDER_SITE_OTHER): Payer: Medicare HMO | Admitting: Urology

## 2021-08-23 VITALS — BP 150/80 | HR 78 | Ht 70.0 in | Wt 204.0 lb

## 2021-08-23 DIAGNOSIS — R310 Gross hematuria: Secondary | ICD-10-CM

## 2021-08-23 MED ORDER — CIPROFLOXACIN HCL 500 MG PO TABS
500.0000 mg | ORAL_TABLET | Freq: Once | ORAL | Status: AC
  Administered 2021-08-23: 500 mg via ORAL

## 2021-08-23 NOTE — Addendum Note (Signed)
Addended byIris Pert on: 08/23/2021 10:25 AM   Modules accepted: Orders

## 2021-08-23 NOTE — Patient Instructions (Signed)

## 2021-08-23 NOTE — H&P (View-Only) (Signed)
   08/23/21  CC: gross hematuria   HPI: Mr Hutsell is a 81yo here for followuop for gross hematuria Blood pressure (!) 150/80, pulse 78, height '5\' 10"'$  (1.778 m), weight 204 lb (92.5 kg). NED. A&Ox3.   No respiratory distress   Abd soft, NT, ND Normal phallus with bilateral descended testicles  Cystoscopy Procedure Note  Patient identification was confirmed, informed consent was obtained, and patient was prepped using Betadine solution.  Lidocaine jelly was administered per urethral meatus.     Pre-Procedure: - Inspection reveals a normal caliber ureteral meatus.  Procedure: The flexible cystoscope was introduced without difficulty - No urethral strictures/lesions are present. -  previous TURP defect. Numerous tortuous vessels with active bleeding.   - Normal bladder neck - Bilateral ureteral orifices identified - Bladder mucosa  reveals multiple areas of 1-2cm erythema - No bladder stones - No trabeculation    Post-Procedure: - Patient tolerated the procedure well  Assessment/ Plan:  We will schedule for bladder biopsy, fulgeration. Risks/benefits/alternatives discussed No follow-ups on file.  Nicolette Bang, MD

## 2021-08-23 NOTE — Progress Notes (Signed)
   08/23/21  CC: gross hematuria   HPI: Mr Reger is a 81yo here for followuop for gross hematuria Blood pressure (!) 150/80, pulse 78, height '5\' 10"'$  (1.778 m), weight 204 lb (92.5 kg). NED. A&Ox3.   No respiratory distress   Abd soft, NT, ND Normal phallus with bilateral descended testicles  Cystoscopy Procedure Note  Patient identification was confirmed, informed consent was obtained, and patient was prepped using Betadine solution.  Lidocaine jelly was administered per urethral meatus.     Pre-Procedure: - Inspection reveals a normal caliber ureteral meatus.  Procedure: The flexible cystoscope was introduced without difficulty - No urethral strictures/lesions are present. -  previous TURP defect. Numerous tortuous vessels with active bleeding.   - Normal bladder neck - Bilateral ureteral orifices identified - Bladder mucosa  reveals multiple areas of 1-2cm erythema - No bladder stones - No trabeculation    Post-Procedure: - Patient tolerated the procedure well  Assessment/ Plan:  We will schedule for bladder biopsy, fulgeration. Risks/benefits/alternatives discussed No follow-ups on file.  Nicolette Bang, MD

## 2021-08-24 ENCOUNTER — Ambulatory Visit (INDEPENDENT_AMBULATORY_CARE_PROVIDER_SITE_OTHER): Payer: Medicare HMO | Admitting: Physician Assistant

## 2021-08-24 VITALS — BP 156/72 | HR 79 | Ht 70.0 in | Wt 204.0 lb

## 2021-08-24 DIAGNOSIS — Z7901 Long term (current) use of anticoagulants: Secondary | ICD-10-CM

## 2021-08-24 DIAGNOSIS — R310 Gross hematuria: Secondary | ICD-10-CM | POA: Diagnosis not present

## 2021-08-24 DIAGNOSIS — Z79899 Other long term (current) drug therapy: Secondary | ICD-10-CM | POA: Diagnosis not present

## 2021-08-24 DIAGNOSIS — R339 Retention of urine, unspecified: Secondary | ICD-10-CM

## 2021-08-24 LAB — CBC WITH DIFFERENTIAL
Basophils Absolute: 0.1 10*3/uL (ref 0.0–0.2)
Basos: 1 %
EOS (ABSOLUTE): 0.2 10*3/uL (ref 0.0–0.4)
Eos: 2 %
Hematocrit: 41.7 % (ref 37.5–51.0)
Hemoglobin: 13.4 g/dL (ref 13.0–17.7)
Immature Grans (Abs): 0 10*3/uL (ref 0.0–0.1)
Immature Granulocytes: 1 %
Lymphocytes Absolute: 1.5 10*3/uL (ref 0.7–3.1)
Lymphs: 17 %
MCH: 29.2 pg (ref 26.6–33.0)
MCHC: 32.1 g/dL (ref 31.5–35.7)
MCV: 91 fL (ref 79–97)
Monocytes Absolute: 0.6 10*3/uL (ref 0.1–0.9)
Monocytes: 7 %
Neutrophils Absolute: 6.1 10*3/uL (ref 1.4–7.0)
Neutrophils: 72 %
RBC: 4.59 x10E6/uL (ref 4.14–5.80)
RDW: 14.8 % (ref 11.6–15.4)
WBC: 8.4 10*3/uL (ref 3.4–10.8)

## 2021-08-24 LAB — MICROSCOPIC EXAMINATION
Bacteria, UA: NONE SEEN
RBC, Urine: >30 /hpf — AB (ref 0–2)
Renal Epithel, UA: NONE SEEN /hpf
WBC, UA: NONE SEEN /hpf (ref 0–5)

## 2021-08-24 LAB — URINALYSIS, ROUTINE W REFLEX MICROSCOPIC
Bilirubin, UA: NEGATIVE
Glucose, UA: NEGATIVE
Leukocytes,UA: NEGATIVE
Nitrite, UA: NEGATIVE
Specific Gravity, UA: 1.015 (ref 1.005–1.030)
Urobilinogen, Ur: 1 mg/dL (ref 0.2–1.0)
pH, UA: 7 (ref 5.0–7.5)

## 2021-08-24 LAB — CYTOLOGY, URINE

## 2021-08-24 NOTE — Progress Notes (Signed)
Simple Catheter Placement  Due to urinary retention patient is present today for a foley cath placement.  Patient was cleaned and prepped in a sterile fashion with betadine. A 16 FR foley catheter was inserted, urine return was noted  1097m, urine was red in color.  The balloon was filled with 10cc of sterile water.  A bed bag was attached for drainage. Patient was also given a night bag to take home and was given instruction on how to change from one bag to another.  Patient was given instruction on proper catheter care.  Patient tolerated well, no complications were noted   Performed by: KLevi Aland CMA  Additional notes/ Follow up: Follow up as scheduled.

## 2021-08-24 NOTE — Progress Notes (Signed)
Assessment: 1. Gross hematuria - Urinalysis, Routine w reflex microscopic  2. Urinary retention  3. Anticoagulated on Coumadin    Plan: Bladder irrigated and indwelling Foley catheter placed.  Catheter care discussed at length.  Patient will have his INR rechecked today and follow-up with his primary care provider for repeat CBC tomorrow.  ED precautions discussed if his catheter is not draining or if he develops pain, fever, worsening dizziness or if he develops shortness of breath, palpitations, chest pain.  As we have an upcoming holiday weekend, the patient understands that we will not be available if he has concerns and to go to the ED.  He is awaiting surgery scheduling for TURBT.  Chief Complaint: No chief complaint on file.   HPI: Troy [name] is a 81 y.o. male who presents for evaluation of ongoing gross hematuria with retention sx since passing clots after cysto yesterday. No dysuria, burning. No fever, chills. Pt on Coumadin. INR last week was 2.0 per the pt. No dizziness, but feeling light headed occasionally for the past 5-7 days. No palpitations, new ShoB, CP. HGB was 13.4 yesterday.  Patient noted to have 1000 mL of urine in his bladder when Foley placed.  Few clots passed during placement.  UA clear except greater than 30 RBCs.  Vital signs are stable and patient reports no orthostatic sxs when standing to leave the office today.  Portions of the above documentation were copied from a prior visit for review purposes only.  Allergies: Allergies  Allergen Reactions   Niacin Itching and Rash    Burning sensation   Contrast Media [Iodinated Contrast Media] Nausea And Vomiting   Iodine-131 Nausea And Vomiting   Nsaids Other (See Comments)    Taking Coumadin    PMH: Past Medical History:  Diagnosis Date   AAA (abdominal aortic aneurysm) (HCC)    Followed by Dr. Sherren Mocha Early   Arthritis    CAD (coronary artery disease)    a. CABG 2000.   Cancer [facility])     Prostate:  Radiation Tx   Carotid artery disease ([place])    Chest pain    precordial. mild chronic .[name]... nonischemic   CHF (congestive heart failure) (HCC)    Chronic edema    Coronary artery disease    a. Nuclear, January, 2008, no ischemia b. Cath 08/2012- 1/4 patent grafts, RCA CTO, no flow-limiting disease, medically managed   Diabetes mellitus without complication ([place])    Dizziness 02/2011   Dyslipidemia    Fall    GERD (gastroesophageal reflux disease)    TAKES TUMS & ROLAIDS AS NEEDED   Hx of CABG 2000   Hypertension    Myocardial infarction ([place]) 1999   Neck pain 02/2011   Pneumonia    Pulmonary hypertension ([place])    Renal artery stenosis (HCC)    50-70%   S/P femoropopliteal bypass surgery    Dr. Donnetta Hutching   Sinus bradycardia    Asymptomatic    PSH: Past Surgical History:  Procedure Laterality Date   CARDIAC CATHETERIZATION  09/26/2012   1/4 patent bypass (occluded SVG-PDA, SVG-OM, LIMA-LAD), SVG-diagonal patent and fills the diagonal and LAD, distal RCA occlusion with left to right collateralization, patent circumflex, LAD with no flow-limiting disease and antegrade flow competitively from SVG-diagonal; EF 60-65%   COLONOSCOPY  11/30/2009   JSH:[id number]. next TCS 11/2019   COLONOSCOPY WITH PROPOFOL N/A 12/18/2019   Procedure: COLONOSCOPY WITH PROPOFOL;  Surgeon: Harvel Quale, MD;  Location: [facility];  Service: Gastroenterology;  Laterality: N/A;  1030   CORONARY ARTERY BYPASS GRAFT  2000   ESOPHAGOGASTRODUODENOSCOPY N/A 08/12/2013   KDT:[id number] peptic stricture with erosive refluxesophagitis - status post Maloney dilation. Hiatal hernia. Abnormalgastric mucosa. Deformity of the pyloric channel suggestive ofprior peptic ulcer disease. Duodenal bulbar diverticulum Statuspost gastric biopsy. h.pylori   LEFT HEART CATHETERIZATION WITH CORONARY/GRAFT ANGIOGRAM N/A 09/26/2012   Procedure: LEFT HEART CATHETERIZATION WITH Beatrix Fetters;   Surgeon: Burnell Blanks, MD;  Location: [facility] CATH LAB;  Service: Cardiovascular;  Laterality: N/A;   MALONEY DILATION N/A 08/12/2013   Procedure: Venia Minks DILATION;  Surgeon: Daneil Dolin, MD;  Location: [facility];  Service: Endoscopy;  Laterality: N/A;   POLYPECTOMY  12/18/2019   Procedure: POLYPECTOMY;  Surgeon: Harvel Quale, MD;  Location: [facility];  Service: Gastroenterology;;  ascending colon polyp    PR VEIN BYPASS GRAFT,AORTO-FEM-POP Right 07/19/1999   PR VEIN BYPASS GRAFT,AORTO-FEM-POP Left 05/02/2006   RIGHT HEART CATH AND CORONARY/GRAFT ANGIOGRAPHY N/A 04/11/2020   Procedure: RIGHT HEART CATH AND CORONARY/GRAFT ANGIOGRAPHY;  Surgeon: Larey Dresser, MD;  Location: [facility] CV LAB;  Service: Cardiovascular;  Laterality: N/A;   SAVORY DILATION N/A 08/12/2013   Procedure: SAVORY DILATION;  Surgeon: Daneil Dolin, MD;  Location: [facility];  Service: Endoscopy;  Laterality: N/A;   WRIST SURGERY     cyst removal    SH: Social History   Tobacco Use   Smoking status: Former    Packs/day: 2.00    Years: 70.00    Pack years: 140.00    Types: Cigarettes    Quit date: 04/21/2018    Years since quitting: 3.3   Smokeless tobacco: Never  Vaping Use   Vaping Use: Never used  Substance Use Topics   Alcohol use: Yes    Alcohol/week: 0.0 - 2.0 standard drinks    Comment: OCCASIONAL   Drug use: Never    ROS: See HPI  PE: BP (!) 156/72   Pulse 79   Ht '5\' 10"'$  (1.778 m)   Wt 204 lb (92.5 kg)   BMI 29.27 kg/m  GENERAL APPEARANCE:  Well appearing, well developed, well nourished, NAD HEENT:  Atraumatic, normocephalic NECK:  Supple. Trachea midline ABDOMEN:  Soft, non-tender, no masses EXTREMITIES:  Moves all extremities well NEUROLOGIC:  Alert and oriented x 3 BACK:  Non-tender to palpation, No CVAT SKIN:  Warm, dry, and intact   Results: Laboratory Data: Lab Results  Component Value Date   WBC 8.4 08/23/2021   HGB 13.4 08/23/2021   HCT  41.7 08/23/2021   MCV 91 08/23/2021   PLT 316 04/11/2020    Lab Results  Component Value Date   CREATININE 1.00 08/18/2021    No results found for: PSA  No results found for: TESTOSTERONE  Lab Results  Component Value Date   HGBA1C 6.2 (H) 03/26/2020    Urinalysis    Component Value Date/Time   APPEARANCEUR Cloudy (A) 08/11/2021 0921   GLUCOSEU Negative 08/11/2021 0921   BILIRUBINUR Negative 08/11/2021 0921   PROTEINUR 2+ (A) 08/11/2021 0921   NITRITE Negative 08/11/2021 0921   LEUKOCYTESUR Negative 08/11/2021 0921    Lab Results  Component Value Date   LABMICR See below: 08/11/2021   WBCUA None seen 08/11/2021   LABEPIT None seen 08/11/2021   BACTERIA None seen 08/11/2021    Pertinent Imaging:  No results found for this or any previous visit.  No results found for this or any previous visit.  No results found for this or any previous visit.  No results found for this or any previous visit.  No results found for this or any previous visit.  No results found for this or any previous visit.  Results for orders placed during the hospital encounter of 08/18/21  CT HEMATURIA WORKUP  Narrative CLINICAL DATA:  Gross hematuria since early May, history of prostate cancer * Tracking Code: BO *  EXAM: CT ABDOMEN AND PELVIS WITHOUT AND WITH CONTRAST  TECHNIQUE: Multidetector CT imaging of the abdomen and pelvis was performed following the standard protocol before and following the bolus administration of intravenous contrast.  RADIATION DOSE REDUCTION: This exam was performed according to the departmental dose-optimization program which includes automated exposure control, adjustment of the mA and/or kV according to patient size and/or use of iterative reconstruction technique.  CONTRAST:  120m OMNIPAQUE IOHEXOL 300 MG/ML  SOLN  COMPARISON:  None Available.  FINDINGS: Lower chest: No acute abnormality. Cardiomegaly. Coronary  artery calcifications.  Hepatobiliary: No solid liver abnormality is seen. Small, definitively benign cyst or hemangioma of the central liver dome for which no further follow-up or characterization is required (series 15, image 30). No gallstones, gallbladder wall thickening, or biliary dilatation.  Pancreas: Unremarkable. No pancreatic ductal dilatation or surrounding inflammatory changes.  Spleen: Normal in size without significant abnormality.  Adrenals/Urinary Tract: Adrenal glands are unremarkable. Small simple, benign bilateral renal cortical cysts, as well as intrinsically hyperdense, nonenhancing hemorrhagic or proteinaceous cysts of the midportions of the left and right kidneys (series 2, image 37). No urinary tract filling defect on delayed phase imaging. Bladder is unremarkable.  Stomach/Bowel: Stomach is within normal limits. Appendix appears normal. No evidence of bowel wall thickening, distention, or inflammatory changes. Descending and sigmoid diverticulosis.  Vascular/Lymphatic: Aortic atherosclerosis. 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 No enlarged abdominal or pelvic lymph nodes.  Reproductive: Prostate brachytherapy.  Other: Small, fat containing bilateral inguinal hernias. No ascites.  Musculoskeletal: No acute or significant osseous findings.  IMPRESSION: 1. No evidence of urinary tract mass, calculus, or hydronephrosis. No urinary tract filling defect on delayed phase imaging. 2. Prostate brachytherapy. No evidence of mass or lymphadenopathy in the abdomen or pelvis. 3. Small simple, benign bilateral renal cortical cysts, as well as nonenhancing, benign hemorrhagic or  proteinaceous cysts. No further follow-up or characterization is required for these benign renal cysts. 4. 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 Recommend referral to a vascular specialist. This recommendation follows ACR consensus guidelines: White Paper of the ACR Incidental Findings Committee II on Vascular Findings. J Am Coll Radiol 2013; 10:789-794. 5. Cardiomegaly and coronary artery disease.  Aortic Atherosclerosis (ICD10-I70.0).   Electronically Signed By: ADelanna AhmadiM.D. On: 08/19/2021 11:29  No results found for this or any previous visit.  No results found for this or any previous visit (from the past 24 hour(s)).

## 2021-08-25 ENCOUNTER — Ambulatory Visit (INDEPENDENT_AMBULATORY_CARE_PROVIDER_SITE_OTHER): Payer: Medicare HMO | Admitting: Urology

## 2021-08-25 ENCOUNTER — Other Ambulatory Visit: Payer: Self-pay

## 2021-08-25 ENCOUNTER — Encounter (HOSPITAL_COMMUNITY): Payer: Self-pay

## 2021-08-25 ENCOUNTER — Emergency Department (HOSPITAL_COMMUNITY)
Admission: EM | Admit: 2021-08-25 | Discharge: 2021-08-25 | Disposition: A | Discharge status: Home / Self Care | Payer: No Typology Code available for payment source | Attending: Emergency Medicine | Admitting: Emergency Medicine

## 2021-08-25 VITALS — BP 190/67 | HR 79

## 2021-08-25 DIAGNOSIS — R339 Retention of urine, unspecified: Secondary | ICD-10-CM

## 2021-08-25 DIAGNOSIS — T839XXA Unspecified complication of genitourinary prosthetic device, implant and graft, initial encounter: Secondary | ICD-10-CM | POA: Insufficient documentation

## 2021-08-25 DIAGNOSIS — Z7982 Long term (current) use of aspirin: Secondary | ICD-10-CM | POA: Insufficient documentation

## 2021-08-25 DIAGNOSIS — R310 Gross hematuria: Secondary | ICD-10-CM

## 2021-08-25 DIAGNOSIS — Z7901 Long term (current) use of anticoagulants: Secondary | ICD-10-CM | POA: Diagnosis not present

## 2021-08-25 DIAGNOSIS — R1030 Lower abdominal pain, unspecified: Secondary | ICD-10-CM | POA: Insufficient documentation

## 2021-08-25 NOTE — ED Provider Notes (Signed)
[facility] EMERGENCY DEPARTMENT Provider Note   CSN: [id number] Arrival date & time: 08/25/21  0011     History  Chief Complaint  Patient presents with   foley not draining    Troy [name] is a 81 y.o. male.  Patient presents to the emergency department for evaluation of bladder pain and possible blocked Foley.  Patient had a Foley catheter placed by his urologist earlier today.  He reports that it was draining in the office and up until 10 PM.  Since that time, it has not drained at all.  He does report that he has noticed blood and clots in the urine.      Home Medications Prior to Admission medications   Medication Sig Start Date End Date Taking? Authorizing Provider  acetaminophen (TYLENOL) 325 MG tablet Take 2 tablets (650 mg total) by mouth every 6 (six) hours as needed for mild pain, fever or headache (or Fever >/= 101). 03/29/20   Roxan Hockey, MD  albuterol (VENTOLIN HFA) 108 (90 Base) MCG/ACT inhaler Inhale 2 puffs into the lungs every 6 (six) hours as needed for wheezing or shortness of breath. 03/29/20   Roxan Hockey, MD  amLODipine (NORVASC) 5 MG tablet Take 1 tablet (5 mg total) by mouth daily. For BP 03/29/20   Roxan Hockey, MD  aspirin 81 MG EC tablet Take 1 tablet (81 mg total) by mouth daily with breakfast. 03/29/20   Denton Brick, Courage, MD  atorvastatin (LIPITOR) 40 MG tablet Take 1 tablet (40 mg total) by mouth daily. 11/30/13   Carlena Bjornstad, MD  famotidine (PEPCID) 20 MG tablet One after supper 06/07/20   Tanda Rockers, MD  HYDROcodone-acetaminophen (NORCO/VICODIN) 5-325 MG tablet Take 1 tablet by mouth daily as needed for moderate pain.    [provider]  ipratropium (ATROVENT) 0.03 % nasal spray Place 2 sprays into both nostrils as needed for rhinitis.    [provider]  ipratropium-albuterol (DUONEB) 0.5-2.5 (3) MG/3ML SOLN Take 3 mLs by nebulization every 4 (four) hours as needed. 03/29/20   Roxan Hockey, MD  isosorbide  mononitrate (IMDUR) 60 MG 24 hr tablet Take 2 tablets (120 mg total) by mouth every evening. 04/11/20   Larey Dresser, MD  losartan (COZAAR) 25 MG tablet Take 1 tablet (25 mg total) by mouth daily. 03/29/20   Roxan Hockey, MD  metFORMIN (GLUCOPHAGE) 1000 MG tablet Take 1,000 mg by mouth daily with breakfast.    [provider]  metoprolol succinate (TOPROL-XL) 25 MG 24 hr tablet Take 1 tablet (25 mg total) by mouth daily. Pt. Needs to make an appt. With Cardiologist in order to receive further refills. Thank You. 1st Attempt. 05/15/21   Dunn, Nedra Hai, PA-C  nitroGLYCERIN (NITROSTAT) 0.4 MG SL tablet Place 1 tablet (0.4 mg total) under the tongue every 5 (five) minutes x 3 doses as needed for chest pain. 12/14/19   Dunn, Nedra Hai, PA-C  OXYGEN Inhale into the lungs as needed. 3 liters n/c    [provider]  pantoprazole (PROTONIX) 40 MG tablet Take 1 tablet (40 mg total) by mouth daily. Take 30-60 min before first meal of the day 06/07/20   Tanda Rockers, MD  potassium chloride (KLOR-CON) 10 MEQ tablet Take 1 tablet (10 mEq total) by mouth daily. Only take while taking Torsemide Patient taking differently: Take 10 mEq by mouth. Only take while taking Torsemide 04/04/20   Larey Dresser, MD  torsemide (DEMADEX) 10 MG tablet Take 1  tablet (10 mg total) by mouth every other day. Take 20 mg for weight gain over 3 lbs in 24 hours 04/13/20   Larey Dresser, MD  warfarin (COUMADIN) 2 MG tablet Take 2 mg by mouth every evening. MANAGED BY PMD 09/27/12   Danella Sensing A, PA-C      Allergies    Niacin, Contrast media [iodinated contrast media], Iodine-131, and Nsaids    Review of Systems   Review of Systems  Physical Exam Updated Vital Signs BP (!) 172/76   Pulse 83   Temp 97.7 F (36.5 C) (Oral)   Resp (!) 22   Ht '5\' 10"'$  (1.778 m)   Wt 92.5 kg   SpO2 96%   BMI 29.27 kg/m  Physical Exam Vitals and nursing note reviewed.  Constitutional:      General: He is not in acute  distress.    Appearance: He is well-developed.  HENT:     Head: Normocephalic and atraumatic.     Mouth/Throat:     Mouth: Mucous membranes are moist.  Eyes:     General: Vision grossly intact. Gaze aligned appropriately.     Extraocular Movements: Extraocular movements intact.     Conjunctiva/sclera: Conjunctivae normal.  Cardiovascular:     Rate and Rhythm: Normal rate and regular rhythm.     Pulses: Normal pulses.     Heart sounds: Normal heart sounds, S1 normal and S2 normal. No murmur heard.   No friction rub. No gallop.  Pulmonary:     Effort: Pulmonary effort is normal. No respiratory distress.     Breath sounds: Normal breath sounds.  Abdominal:     Palpations: Abdomen is soft.     Tenderness: There is abdominal tenderness in the suprapubic area. There is no guarding or rebound.     Hernia: No hernia is present.  Musculoskeletal:        General: No swelling.     Cervical back: Full passive range of motion without pain, normal range of motion and neck supple. No pain with movement, spinous process tenderness or muscular tenderness. Normal range of motion.     Right lower leg: No edema.     Left lower leg: No edema.  Skin:    General: Skin is warm and dry.     Capillary Refill: Capillary refill takes less than 2 seconds.     Findings: No ecchymosis, erythema, lesion or wound.  Neurological:     Mental Status: He is alert and oriented to person, place, and time.     GCS: GCS eye subscore is 4. GCS verbal subscore is 5. GCS motor subscore is 6.     Cranial Nerves: Cranial nerves 2-12 are intact.     Sensory: Sensation is intact.     Motor: Motor function is intact. No weakness or abnormal muscle tone.     Coordination: Coordination is intact.  Psychiatric:        Mood and Affect: Mood normal.        Speech: Speech normal.        Behavior: Behavior normal.    ED Results / Procedures / Treatments   Labs (all labs ordered are listed, but only abnormal results are  displayed) Labs Reviewed - No data to display  EKG None  Radiology No results found.  Procedures Procedures    Medications Ordered in ED Medications - No data to display  ED Course/ Medical Decision Making/ A&P  Medical Decision Making  Patient presents to the emergency department for problem with his Foley catheter.  He reports that it has not drained into the bag since 10 PM but he has had urine draining around the tube.  He denies any trauma to the area.  Catheter does not appear to be clogged, irrigates easily but continues to drain around the catheter.  Catheter replaced.        Final Clinical Impression(s) / ED Diagnoses Final diagnoses:  Foley catheter problem, initial encounter [facility])    Rx / DC Orders ED Discharge Orders     None         [place] Grosser, Gwenyth Allegra, MD 08/25/21 [phone]

## 2021-08-25 NOTE — Progress Notes (Signed)
Letter sent.

## 2021-08-25 NOTE — ED Triage Notes (Signed)
Pt presents to ED from home, pt drove self, pt reports foley was placed at urologist office today due to inability to urinate with hematuria and clots. Pt says the foley stopped draining around 10pm.

## 2021-08-25 NOTE — ED Notes (Signed)
Pt came in with 16 Fr foley cath- irrigated with 180 cc sterile urine- with immediate return of bloody urine- total back was 180cc. Noted that urine was leaking around foley, pt reports this was happening at home as well. No blood clots noted. DR Betsey Holiday made aware. New orders received to DC current foley and replaced new foley.

## 2021-08-25 NOTE — ED Notes (Signed)
Foley flushed with 120 cc sterile water with return of 120 cc bloody urine with several blood clots noted. Dr Betsey Holiday made aware.

## 2021-08-29 ENCOUNTER — Encounter (HOSPITAL_COMMUNITY)
Admission: RE | Admit: 2021-08-29 | Discharge: 2021-08-29 | Disposition: A | Discharge status: Home / Self Care | Payer: Medicare HMO | Source: Ambulatory Visit | Attending: Urology | Admitting: Urology

## 2021-08-29 ENCOUNTER — Other Ambulatory Visit (HOSPITAL_COMMUNITY): Payer: Medicare HMO

## 2021-08-29 ENCOUNTER — Encounter (HOSPITAL_COMMUNITY): Payer: Self-pay

## 2021-08-29 ENCOUNTER — Other Ambulatory Visit: Payer: Self-pay

## 2021-08-29 VITALS — BP 132/89 | HR 61 | Temp 97.3°F | Resp 18 | Ht 70.0 in | Wt 204.0 lb

## 2021-08-29 DIAGNOSIS — Z79899 Other long term (current) drug therapy: Secondary | ICD-10-CM | POA: Diagnosis not present

## 2021-08-29 DIAGNOSIS — Z01812 Encounter for preprocedural laboratory examination: Secondary | ICD-10-CM | POA: Diagnosis not present

## 2021-08-29 DIAGNOSIS — Z01818 Encounter for other preprocedural examination: Secondary | ICD-10-CM

## 2021-08-29 LAB — BASIC METABOLIC PANEL
Anion gap: 6 (ref 5–15)
BUN: 11 mg/dL (ref 8–23)
CO2: 23 mmol/L (ref 22–32)
Calcium: 8.5 mg/dL — ABNORMAL LOW (ref 8.9–10.3)
Chloride: 111 mmol/L (ref 98–111)
Creatinine, Ser: 0.75 mg/dL (ref 0.61–1.24)
GFR, Estimated: >60 mL/min (ref >60)
Glucose, Bld: 111 mg/dL — ABNORMAL HIGH (ref 70–99)
Potassium: 4.2 mmol/L (ref 3.5–5.1)
Sodium: 140 mmol/L (ref 135–145)

## 2021-08-29 LAB — HEMOGLOBIN A1C
Hgb A1c MFr Bld: 6.5 % — ABNORMAL HIGH (ref 4.8–5.6)
Mean Plasma Glucose: 139.85 mg/dL

## 2021-08-29 NOTE — Patient Instructions (Signed)
Your procedure is scheduled on: 08/31/2021  Report to [place] Entrance at     12:45 PM.  Call this number if you have problems the morning of surgery: [phone]   Remember:   Do not Eat or Drink after midnight         No Smoking the morning of surgery  :  Take these medicines the morning of surgery with A SIP OF WATER: Amlodipine, isosorbide, metoprolol, and pantoprazole   Use inhalers if needed  No diabetic medication am of procedure  Follow instructions from office regarding Coumadin   Do not wear jewelry, make-up or nail polish.  Do not wear lotions, powders, or perfumes. You may wear deodorant.  Do not shave 48 hours prior to surgery. Men may shave face and neck.  Do not bring valuables to the hospital.  Contacts, dentures or bridgework may not be worn into surgery.  Leave suitcase in the car. After surgery it may be brought to your room.  For patients admitted to the hospital, checkout time is 11:00 AM the day of discharge.   Patients discharged the day of surgery will not be allowed to drive home.    Special Instructions: Shower using CHG night before surgery and shower the day of surgery use CHG.  Use special wash - you have one bottle of CHG for all showers.  You should use approximately 1/2 of the bottle for each shower.  How to Use Chlorhexidine for Bathing Chlorhexidine gluconate (CHG) is a germ-killing (antiseptic) solution that is used to clean the skin. It can get rid of the bacteria that normally live on the skin and can keep them away for about 24 hours. To clean your skin with CHG, you may be given: A CHG solution to use in the shower or as part of a sponge bath. A prepackaged cloth that contains CHG. Cleaning your skin with CHG may help lower the risk for infection: While you are staying in the intensive care unit of the hospital. If you have a vascular access, such as a central line, to provide short-term or long-term access to your veins. If you  have a catheter to drain urine from your bladder. If you are on a ventilator. A ventilator is a machine that helps you breathe by moving air in and out of your lungs. After surgery. What are the risks? Risks of using CHG include: A skin reaction. Hearing loss, if CHG gets in your ears and you have a perforated eardrum. Eye injury, if CHG gets in your eyes and is not rinsed out. The CHG product catching fire. Make sure that you avoid smoking and flames after applying CHG to your skin. Do not use CHG: If you have a chlorhexidine allergy or have previously reacted to chlorhexidine. On babies younger than 10 months of age. How to use CHG solution Use CHG only as told by your health care provider, and follow the instructions on the label. Use the full amount of CHG as directed. Usually, this is one bottle. During a shower Follow these steps when using CHG solution during a shower (unless your health care provider gives you different instructions): Start the shower. Use your normal soap and shampoo to wash your face and hair. Turn off the shower or move out of the shower stream. Pour the CHG onto a clean washcloth. Do not use any type of brush or rough-edged sponge. Starting at your neck, lather your body down to your toes. Make sure you  follow these instructions: If you will be having surgery, pay special attention to the part of your body where you will be having surgery. Scrub this area for at least 1 minute. Do not use CHG on your head or face. If the solution gets into your ears or eyes, rinse them well with water. Avoid your genital area. Avoid any areas of skin that have broken skin, cuts, or scrapes. Scrub your back and under your arms. Make sure to wash skin folds. Let the lather sit on your skin for 1-2 minutes or as long as told by your health care provider. Thoroughly rinse your entire body in the shower. Make sure that all body creases and crevices are rinsed well. Dry off with a  clean towel. Do not put any substances on your body afterward--such as powder, lotion, or perfume--unless you are told to do so by your health care provider. Only use lotions that are recommended by the manufacturer. Put on clean clothes or pajamas. If it is the night before your surgery, sleep in clean sheets.  During a sponge bath Follow these steps when using CHG solution during a sponge bath (unless your health care provider gives you different instructions): Use your normal soap and shampoo to wash your face and hair. Pour the CHG onto a clean washcloth. Starting at your neck, lather your body down to your toes. Make sure you follow these instructions: If you will be having surgery, pay special attention to the part of your body where you will be having surgery. Scrub this area for at least 1 minute. Do not use CHG on your head or face. If the solution gets into your ears or eyes, rinse them well with water. Avoid your genital area. Avoid any areas of skin that have broken skin, cuts, or scrapes. Scrub your back and under your arms. Make sure to wash skin folds. Let the lather sit on your skin for 1-2 minutes or as long as told by your health care provider. Using a different clean, wet washcloth, thoroughly rinse your entire body. Make sure that all body creases and crevices are rinsed well. Dry off with a clean towel. Do not put any substances on your body afterward--such as powder, lotion, or perfume--unless you are told to do so by your health care provider. Only use lotions that are recommended by the manufacturer. Put on clean clothes or pajamas. If it is the night before your surgery, sleep in clean sheets. How to use CHG prepackaged cloths Only use CHG cloths as told by your health care provider, and follow the instructions on the label. Use the CHG cloth on clean, dry skin. Do not use the CHG cloth on your head or face unless your health care provider tells you to. When washing  with the CHG cloth: Avoid your genital area. Avoid any areas of skin that have broken skin, cuts, or scrapes. Before surgery Follow these steps when using a CHG cloth to clean before surgery (unless your health care provider gives you different instructions): Using the CHG cloth, vigorously scrub the part of your body where you will be having surgery. Scrub using a back-and-forth motion for 3 minutes. The area on your body should be completely wet with CHG when you are done scrubbing. Do not rinse. Discard the cloth and let the area air-dry. Do not put any substances on the area afterward, such as powder, lotion, or perfume. Put on clean clothes or pajamas. If it is the night before  your surgery, sleep in clean sheets.  For general bathing Follow these steps when using CHG cloths for general bathing (unless your health care provider gives you different instructions). Use a separate CHG cloth for each area of your body. Make sure you wash between any folds of skin and between your fingers and toes. Wash your body in the following order, switching to a new cloth after each step: The front of your neck, shoulders, and chest. Both of your arms, under your arms, and your hands. Your stomach and groin area, avoiding the genitals. Your right leg and foot. Your left leg and foot. The back of your neck, your back, and your buttocks. Do not rinse. Discard the cloth and let the area air-dry. Do not put any substances on your body afterward--such as powder, lotion, or perfume--unless you are told to do so by your health care provider. Only use lotions that are recommended by the manufacturer. Put on clean clothes or pajamas. Contact a health care provider if: Your skin gets irritated after scrubbing. You have questions about using your solution or cloth. You swallow any chlorhexidine. Call your local poison control center (1-678-070-9957 in the [place] Get help right away if: Your eyes itch badly, or  they become very red or swollen. Your skin itches badly and is red or swollen. Your hearing changes. You have trouble seeing. You have swelling or tingling in your mouth or throat. You have trouble breathing. These symptoms may represent a serious problem that is an emergency. Do not wait to see if the symptoms will go away. Get medical help right away. Call your local emergency services (911 in the [place] Do not drive yourself to the hospital. Summary Chlorhexidine gluconate (CHG) is a germ-killing (antiseptic) solution that is used to clean the skin. Cleaning your skin with CHG may help to lower your risk for infection. You may be given CHG to use for bathing. It may be in a bottle or in a prepackaged cloth to use on your skin. Carefully follow your health care provider's instructions and the instructions on the product label. Do not use CHG if you have a chlorhexidine allergy. Contact your health care provider if your skin gets irritated after scrubbing. This information is not intended to replace advice given to you by your health care provider. Make sure you discuss any questions you have with your health care provider. Document Revised: 05/30/2020 Document Reviewed: 05/30/2020 Elsevier Patient Education  [place]. Transurethral Resection of Bladder Tumor, Care After The following information offers guidance on how to care for yourself after your procedure. Your health care provider may also give you more specific instructions. If you have problems or questions, contact your health care provider. What can I expect after the procedure? After the procedure, it is common to have: A small amount of blood or small blood clots in your urine for up to 2 weeks. Soreness or mild pain from your catheter. After your catheter is removed, you may have mild soreness, especially when urinating. A need to urinate often. Pain in your lower abdomen. Follow these instructions at  home: Medicines  Take over-the-counter and prescription medicines only as told by your health care provider. If you were prescribed an antibiotic medicine, take it as told by your health care provider. Do not stop taking the antibiotic even if you start to feel better. Ask your health care provider if the medicine prescribed to you: Requires you to avoid driving or using machinery. Can cause  constipation. You may need to take these actions to prevent or treat constipation: Drink enough fluid to keep your urine pale yellow. Take over-the-counter or prescription medicines. Eat foods that are high in fiber, such as beans, whole grains, and fresh fruits and vegetables. Limit foods that are high in fat and processed sugars, such as fried or sweet foods. Activity  If you were given a sedative during the procedure, it can affect you for several hours. Do not drive or operate machinery until your health care provider says that it is safe. Rest as told by your health care provider. Avoid sitting for a long time without moving. Get up to take short walks every 1-2 hours. This is important to improve blood flow and breathing. Ask for help if you feel weak or unsteady. Do not lift anything that is heavier than 10 lb (4.5 kg), or the limit that you are told, until your health care provider says that it is safe. Avoid intense physical activity for as long as told by your health care provider. Do not have sex until your health care provider approves. Return to your normal activities as told by your health care provider. Ask your health care provider what activities are safe for you. General instructions If you have a catheter, follow instructions from your health care provider about caring for your catheter and your drainage bag. Do not drink alcohol for as long as told by your health care provider. This is especially important if you are taking prescription pain medicines. Do not use any products that  contain nicotine or tobacco. These products include cigarettes, chewing tobacco, and vaping devices, such as e-cigarettes. If you need help quitting, ask your health care provider. Wear compression stockings as told by your health care provider. These stockings help to prevent blood clots and reduce swelling in your legs. Keep all follow-up visits. This is important. You will need to be followed closely with regular checks of your bladder and urethra (cystoscopies) to make sure that the cancer does not come back. Contact a health care provider if: You have blood in your urine for more than 2 weeks. You become constipated. Signs of constipation may include: Having fewer than three bowel movements in a week. Difficulty having a bowel movement. Stools that are dry, hard, or larger than normal. You have a urinary catheter in place, and you have: Spasms or pain. Problems with your catheter or your catheter is blocked. Your catheter has been taken out but you are unable to urinate. You have signs of infection, such as: Fever or chills. Cloudy or bad-smelling urine. Get help right away if: You have severe abdominal pain that gets worse or does not improve with medicine. You have a lot of large blood clots in your urine. You develop swelling or pain in your leg. You have difficulty breathing. These symptoms may be an emergency. Get help right away. Call 911. Do not wait to see if the symptoms will go away. Do not drive yourself to the hospital. Summary After your procedure, it is common to have a small amount of blood or small blood clots in your urine, soreness or mild pain from your catheter, and pain in your lower abdomen. Take over-the-counter and prescription medicines only as told by your health care provider. Rest as told by your health care provider. Follow your health care provider's instructions about returning to normal activities. Ask what activities are safe for you. If you have a  catheter, follow instructions from  your health care provider about caring for your catheter and your drainage bag. This information is not intended to replace advice given to you by your health care provider. Make sure you discuss any questions you have with your health care provider. Document Revised: 03/24/2021 Document Reviewed: 03/24/2021 Elsevier Patient Education  [place] Anesthesia, Adult, Care After This sheet gives you information about how to care for yourself after your procedure. Your health care provider may also give you more specific instructions. If you have problems or questions, contact your health care provider. What can I expect after the procedure? After the procedure, the following side effects are common: Pain or discomfort at the IV site. Nausea. Vomiting. Sore throat. Trouble concentrating. Feeling cold or chills. Feeling weak or tired. Sleepiness and fatigue. Soreness and body aches. These side effects can affect parts of the body that were not involved in surgery. Follow these instructions at home: For the time period you were told by your health care provider:  Rest. Do not participate in activities where you could fall or become injured. Do not drive or use machinery. Do not drink alcohol. Do not take sleeping pills or medicines that cause drowsiness. Do not make important decisions or sign legal documents. Do not take care of children on your own. Eating and drinking Follow any instructions from your health care provider about eating or drinking restrictions. When you feel hungry, start by eating small amounts of foods that are soft and easy to digest (bland), such as toast. Gradually return to your regular diet. Drink enough fluid to keep your urine pale yellow. If you vomit, rehydrate by drinking water, juice, or clear broth. General instructions If you have sleep apnea, surgery and certain medicines can increase your risk for  breathing problems. Follow instructions from your health care provider about wearing your sleep device: Anytime you are sleeping, including during daytime naps. While taking prescription pain medicines, sleeping medicines, or medicines that make you drowsy. Have a responsible adult stay with you for the time you are told. It is important to have someone help care for you until you are awake and alert. Return to your normal activities as told by your health care provider. Ask your health care provider what activities are safe for you. Take over-the-counter and prescription medicines only as told by your health care provider. If you smoke, do not smoke without supervision. Keep all follow-up visits as told by your health care provider. This is important. Contact a health care provider if: You have nausea or vomiting that does not get better with medicine. You cannot eat or drink without vomiting. You have pain that does not get better with medicine. You are unable to pass urine. You develop a skin rash. You have a fever. You have redness around your IV site that gets worse. Get help right away if: You have difficulty breathing. You have chest pain. You have blood in your urine or stool, or you vomit blood. Summary After the procedure, it is common to have a sore throat or nausea. It is also common to feel tired. Have a responsible adult stay with you for the time you are told. It is important to have someone help care for you until you are awake and alert. When you feel hungry, start by eating small amounts of foods that are soft and easy to digest (bland), such as toast. Gradually return to your regular diet. Drink enough fluid to keep your urine pale yellow. Return  to your normal activities as told by your health care provider. Ask your health care provider what activities are safe for you. This information is not intended to replace advice given to you by your health care provider. Make  sure you discuss any questions you have with your health care provider. Document Revised: 12/03/2019 Document Reviewed: 07/02/2019 Elsevier Patient Education  [place].

## 2021-08-30 NOTE — Pre-Procedure Instructions (Signed)
Dr Briant Cedar aware of cardiac history and has no further orders.

## 2021-08-31 ENCOUNTER — Encounter (HOSPITAL_COMMUNITY): Payer: Self-pay | Admitting: Urology

## 2021-08-31 ENCOUNTER — Encounter (HOSPITAL_COMMUNITY)
Admission: RE | Disposition: A | Discharge status: Home / Self Care | Payer: Self-pay | Source: Home / Self Care | Attending: Urology

## 2021-08-31 ENCOUNTER — Ambulatory Visit (HOSPITAL_COMMUNITY): Payer: Medicare HMO | Admitting: Anesthesiology

## 2021-08-31 ENCOUNTER — Ambulatory Visit (HOSPITAL_COMMUNITY)
Admission: RE | Admit: 2021-08-31 | Discharge: 2021-08-31 | Disposition: A | Discharge status: Home / Self Care | Payer: Medicare HMO | Attending: Urology | Admitting: Urology

## 2021-08-31 ENCOUNTER — Ambulatory Visit (HOSPITAL_BASED_OUTPATIENT_CLINIC_OR_DEPARTMENT_OTHER): Payer: Medicare HMO | Admitting: Anesthesiology

## 2021-08-31 DIAGNOSIS — I2510 Atherosclerotic heart disease of native coronary artery without angina pectoris: Secondary | ICD-10-CM | POA: Diagnosis not present

## 2021-08-31 DIAGNOSIS — E119 Type 2 diabetes mellitus without complications: Secondary | ICD-10-CM | POA: Insufficient documentation

## 2021-08-31 DIAGNOSIS — D303 Benign neoplasm of bladder: Secondary | ICD-10-CM | POA: Insufficient documentation

## 2021-08-31 DIAGNOSIS — I252 Old myocardial infarction: Secondary | ICD-10-CM | POA: Insufficient documentation

## 2021-08-31 DIAGNOSIS — K219 Gastro-esophageal reflux disease without esophagitis: Secondary | ICD-10-CM | POA: Diagnosis not present

## 2021-08-31 DIAGNOSIS — R310 Gross hematuria: Secondary | ICD-10-CM | POA: Insufficient documentation

## 2021-08-31 DIAGNOSIS — I739 Peripheral vascular disease, unspecified: Secondary | ICD-10-CM | POA: Diagnosis not present

## 2021-08-31 DIAGNOSIS — Z951 Presence of aortocoronary bypass graft: Secondary | ICD-10-CM | POA: Diagnosis not present

## 2021-08-31 DIAGNOSIS — G4734 Idiopathic sleep related nonobstructive alveolar hypoventilation: Secondary | ICD-10-CM | POA: Diagnosis not present

## 2021-08-31 DIAGNOSIS — I110 Hypertensive heart disease with heart failure: Secondary | ICD-10-CM

## 2021-08-31 DIAGNOSIS — Z8711 Personal history of peptic ulcer disease: Secondary | ICD-10-CM | POA: Diagnosis not present

## 2021-08-31 DIAGNOSIS — N3289 Other specified disorders of bladder: Secondary | ICD-10-CM

## 2021-08-31 DIAGNOSIS — Z87891 Personal history of nicotine dependence: Secondary | ICD-10-CM | POA: Diagnosis not present

## 2021-08-31 DIAGNOSIS — I509 Heart failure, unspecified: Secondary | ICD-10-CM | POA: Diagnosis not present

## 2021-08-31 DIAGNOSIS — N3021 Other chronic cystitis with hematuria: Secondary | ICD-10-CM | POA: Diagnosis not present

## 2021-08-31 DIAGNOSIS — D494 Neoplasm of unspecified behavior of bladder: Secondary | ICD-10-CM

## 2021-08-31 HISTORY — PX: TRANSURETHRAL RESECTION OF BLADDER TUMOR: SHX2575

## 2021-08-31 HISTORY — PX: CYSTOSCOPY: SHX5120

## 2021-08-31 LAB — GLUCOSE, CAPILLARY
Glucose-Capillary: 115 mg/dL — ABNORMAL HIGH (ref 70–99)
Glucose-Capillary: 118 mg/dL — ABNORMAL HIGH (ref 70–99)

## 2021-08-31 SURGERY — CYSTOSCOPY
Anesthesia: General | Site: Urethra

## 2021-08-31 MED ORDER — EPHEDRINE 5 MG/ML INJ
INTRAVENOUS | Status: AC
  Filled 2021-08-31: qty 5

## 2021-08-31 MED ORDER — CEFAZOLIN SODIUM-DEXTROSE 2-4 GM/100ML-% IV SOLN
2.0000 g | INTRAVENOUS | Status: AC
  Administered 2021-08-31: 2 g via INTRAVENOUS

## 2021-08-31 MED ORDER — CHLORHEXIDINE GLUCONATE 0.12 % MT SOLN
15.0000 mL | Freq: Once | OROMUCOSAL | Status: AC
Start: 2021-08-31 — End: 2021-08-31
  Administered 2021-08-31: 15 mL via OROMUCOSAL

## 2021-08-31 MED ORDER — PROPOFOL 10 MG/ML IV BOLUS
INTRAVENOUS | Status: AC
  Filled 2021-08-31: qty 20

## 2021-08-31 MED ORDER — LACTATED RINGERS IV SOLN
INTRAVENOUS | Status: DC
Start: 2021-08-31 — End: 2021-08-31

## 2021-08-31 MED ORDER — ONDANSETRON HCL 4 MG/2ML IJ SOLN: 4.0000 mg | Freq: Once | INTRAMUSCULAR | Status: DC | PRN

## 2021-08-31 MED ORDER — FENTANYL CITRATE PF 50 MCG/ML IJ SOSY: 25.0000 ug | PREFILLED_SYRINGE | INTRAMUSCULAR | Status: DC | PRN

## 2021-08-31 MED ORDER — EPHEDRINE SULFATE (PRESSORS) 50 MG/ML IJ SOLN
INTRAMUSCULAR | Status: DC | PRN
  Administered 2021-08-31: 10 mg via INTRAVENOUS

## 2021-08-31 MED ORDER — LIDOCAINE HCL (CARDIAC) PF 100 MG/5ML IV SOSY
PREFILLED_SYRINGE | INTRAVENOUS | Status: DC | PRN
  Administered 2021-08-31: 60 mg via INTRAVENOUS

## 2021-08-31 MED ORDER — ONDANSETRON HCL 4 MG/2ML IJ SOLN
INTRAMUSCULAR | Status: DC | PRN
  Administered 2021-08-31: 4 mg via INTRAVENOUS

## 2021-08-31 MED ORDER — HYDROCODONE-ACETAMINOPHEN 5-325 MG PO TABS
1.0000 | ORAL_TABLET | Freq: Every day | ORAL | 0 refills | Status: DC | PRN
Start: 2021-08-31 — End: 2021-12-02

## 2021-08-31 MED ORDER — DEXAMETHASONE SODIUM PHOSPHATE 4 MG/ML IJ SOLN
INTRAMUSCULAR | Status: DC | PRN
  Administered 2021-08-31: 4 mg via INTRAVENOUS

## 2021-08-31 MED ORDER — SODIUM CHLORIDE 0.9 % IR SOLN
Status: DC | PRN
  Administered 2021-08-31: 3000 mL

## 2021-08-31 MED ORDER — ORAL CARE MOUTH RINSE
15.0000 mL | Freq: Once | OROMUCOSAL | Status: AC
Start: 2021-08-31 — End: 2021-08-31

## 2021-08-31 MED ORDER — CHLORHEXIDINE GLUCONATE 0.12 % MT SOLN
OROMUCOSAL | Status: AC
  Filled 2021-08-31: qty 15

## 2021-08-31 MED ORDER — CEFAZOLIN SODIUM-DEXTROSE 2-4 GM/100ML-% IV SOLN
INTRAVENOUS | Status: AC
  Filled 2021-08-31: qty 100

## 2021-08-31 MED ORDER — STERILE WATER FOR IRRIGATION IR SOLN
Status: DC | PRN
  Administered 2021-08-31: 500 mL

## 2021-08-31 MED ORDER — FENTANYL CITRATE (PF) 100 MCG/2ML IJ SOLN
INTRAMUSCULAR | Status: AC
  Filled 2021-08-31: qty 2

## 2021-08-31 MED ORDER — FENTANYL CITRATE (PF) 100 MCG/2ML IJ SOLN
INTRAMUSCULAR | Status: DC | PRN
  Administered 2021-08-31: 25 ug via INTRAVENOUS

## 2021-08-31 MED ORDER — PROPOFOL 10 MG/ML IV BOLUS
INTRAVENOUS | Status: DC | PRN
  Administered 2021-08-31: 200 mg via INTRAVENOUS

## 2021-08-31 SURGICAL SUPPLY — 24 items
BAG DRAIN URO TABLE W/ADPT NS (BAG) ×3 IMPLANT
BAG DRN 8 ADPR NS SKTRN CSTL (BAG) ×2
BAG DRN RND TRDRP ANRFLXCHMBR (UROLOGICAL SUPPLIES) ×2
BAG HAMPER (MISCELLANEOUS) ×3 IMPLANT
BAG URINE DRAIN 2000ML AR STRL (UROLOGICAL SUPPLIES) ×3 IMPLANT
CATH FOLEY LATEX FREE 22FR (CATHETERS) ×3
CATH FOLEY LF 22FR (CATHETERS) IMPLANT
CLOTH BEACON ORANGE TIMEOUT ST (SAFETY) ×3 IMPLANT
ELECT LOOP 22F BIPOLAR SML (ELECTROSURGICAL) ×3
ELECTRODE LOOP 22F BIPOLAR SML (ELECTROSURGICAL) ×2 IMPLANT
GLOVE BIO SURGEON STRL SZ8 (GLOVE) ×3 IMPLANT
GLOVE BIOGEL PI IND STRL 7.0 (GLOVE) ×4 IMPLANT
GLOVE BIOGEL PI INDICATOR 7.0 (GLOVE) ×2
GOWN STRL REUS W/TWL LRG LVL3 (GOWN DISPOSABLE) ×6 IMPLANT
GOWN STRL REUS W/TWL XL LVL3 (GOWN DISPOSABLE) ×3 IMPLANT
IV NS IRRIG 3000ML ARTHROMATIC (IV SOLUTION) ×6 IMPLANT
KIT TURNOVER CYSTO (KITS) ×3 IMPLANT
MANIFOLD NEPTUNE II (INSTRUMENTS) ×3 IMPLANT
PACK CYSTO (CUSTOM PROCEDURE TRAY) ×3 IMPLANT
PAD ARMBOARD 7.5X6 YLW CONV (MISCELLANEOUS) ×3 IMPLANT
TOWEL NATURAL 4PK STERILE (DISPOSABLE) ×3 IMPLANT
TOWEL OR 17X26 4PK STRL BLUE (TOWEL DISPOSABLE) ×3 IMPLANT
WATER STERILE IRR 3000ML UROMA (IV SOLUTION) ×3 IMPLANT
WATER STERILE IRR 500ML POUR (IV SOLUTION) ×3 IMPLANT

## 2021-08-31 NOTE — Transfer of Care (Signed)
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  Last Pain:  Vitals:   08/31/21 0935  TempSrc: Tympanic  PainSc: 0-No pain         Complications: No notable events documented.

## 2021-08-31 NOTE — Anesthesia Procedure Notes (Signed)
Procedure Name: LMA Insertion Date/Time: 08/31/2021 8:49 AM Performed by: Sharee Pimple, CRNA Pre-anesthesia Checklist: Emergency Drugs available, Patient identified, Suction available, Patient being monitored and Timeout performed Patient Re-evaluated:Patient Re-evaluated prior to induction Oxygen Delivery Method: Circle system utilized Preoxygenation: Pre-oxygenation with 100% oxygen Induction Type: IV induction LMA: LMA inserted LMA Size: 5.0

## 2021-08-31 NOTE — Interval H&P Note (Signed)
History and Physical Interval Note:  08/31/2021 8:28 AM  Troy [name]  has presented today for surgery, with the diagnosis of bladder lesion gross hematuria, prostate varices.  The various methods of treatment have been discussed with the patient and family. After consideration of risks, benefits and other options for treatment, the patient has consented to  Procedure(s) with comments: CYSTOSCOPY (N/A) - pt knows to arrive at 7:00 [place] (TURBT) (N/A) as a surgical intervention.  The patient's history has been reviewed, patient examined, no change in status, stable for surgery.  I have reviewed the patient's chart and labs.  Questions were answered to the patient's satisfaction.     Nicolette Bang

## 2021-08-31 NOTE — Anesthesia Preprocedure Evaluation (Signed)
Anesthesia Evaluation  Patient identified by MRN, date of birth, ID band Patient awake    Reviewed: Allergy & Precautions, H&P , NPO status , Patient's Chart, lab work & pertinent test results, reviewed documented beta blocker date and time   Airway Mallampati: II  TM Distance: >3 FB Neck ROM: full    Dental no notable dental hx.    Pulmonary neg pulmonary ROS, former smoker,    Pulmonary exam normal breath sounds clear to auscultation       Cardiovascular Exercise Tolerance: Good hypertension, + CAD, + Past MI, + CABG, + Peripheral Vascular Disease, +CHF and + DOE   Rhythm:regular Rate:Normal     Neuro/Psych negative neurological ROS  negative psych ROS   GI/Hepatic Neg liver ROS, PUD, GERD  Medicated,  Endo/Other  negative endocrine ROSdiabetes, Type 2  Renal/GU negative Renal ROS  negative genitourinary   Musculoskeletal   Abdominal   Peds  Hematology  (+) Blood dyscrasia, anemia ,   Anesthesia Other Findings 1. Left ventricular ejection fraction, by estimation, is 55 to 60%. The  left ventricle has normal function. The left ventricle has no regional  wall motion abnormalities. There is mild left ventricular hypertrophy.  Left ventricular diastolic parameters  are indeterminate.  2. Ventricular septum is flattened in systole suggesting RV pressure  overload. RV not well visualized. Grossly appears mildly enlarged with  mildly reduced function. . Right ventricular systolic function is mildly  reduced. The right ventricular size is  mildly enlarged. 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  3. Left atrial size was severely dilated.  4. Right atrial size was severely dilated.  5. The mitral valve is abnormal. Mild mitral valve regurgitation. No  evidence of mitral stenosis.  6. The tricuspid valve is abnormal.  7. The aortic valve is tricuspid. There is moderate calcification of  the  aortic valve. There is moderate thickening of the aortic valve. Aortic  valve regurgitation is mild. No aortic stenosis is present.  8. The inferior vena cava is dilated in size with >50% respiratory  variability, suggesting right atrial pressure of 8 mmHg.   Reproductive/Obstetrics negative OB ROS                             Anesthesia Physical Anesthesia Plan  ASA: 3  Anesthesia Plan: General and General LMA   Post-op Pain Management:    Induction:   PONV Risk Score and Plan: Ondansetron  Airway Management Planned:   Additional Equipment:   Intra-op Plan:   Post-operative Plan:   Informed Consent: I have reviewed the patients History and Physical, chart, labs and discussed the procedure including the risks, benefits and alternatives for the proposed anesthesia with the patient or authorized representative who has indicated his/her understanding and acceptance.     Dental Advisory Given  Plan Discussed with: CRNA  Anesthesia Plan Comments:         Anesthesia Quick Evaluation

## 2021-08-31 NOTE — Op Note (Signed)
.  Preoperative diagnosis: bladder tumor  Postoperative diagnosis: Same  Procedure: 1 cystoscopy 2.  Clot evacuation 3. Transurethral resection of bladder tumor, medium  Attending: Rosie Fate  Anesthesia: General  Estimated blood loss: Minimal  Drains: 22 French foley  Specimens: posterior wall bladder tumor  Antibiotics: ancef  Findings: multiple areas of irritation from foley catheter. 30cc clot in the bladder. 3cm sessile posterior wall tumor and multiple areas in prostatic urethra with active bleeding.  Ureteral orifices in normal anatomic location.  Indications: Patient is a 81 year old male with a history of bladder tumor and gross hematuria.  After discussing treatment options, they decided proceed with transurethral resection of a bladder tumor.  Procedure in detail: The patient was brought to the operating room and a brief timeout was done to ensure correct patient, correct procedure, correct site.  General anesthesia was administered patient was placed in dorsal lithotomy position.  Their genitalia was then prepped and draped in usual sterile fashion.  A rigid 77 French cystoscope was passed in the urethra and the bladder.  Bladder was inspected and we noted clot in the bladder, active bleeding in the prostatic urethra and a 3cm bladder tumor.  the ureteral orifices were in the normal orthotopic locations. . We then removed the cystoscope and placed a resectoscope into the bladder. We proceeded to remove the clot burden from the bladder. Once this was complete we turned our attention to the bladder tumor. Using the bipolar resectoscope we removed the bladder tumor down to the base. A subsequent muscle deep biopsy was then taken. Hemostasis was then obtained with electrocautery. We then fulgerated the prostatic urethra to obtain hemostasis. We then removed the bladder tumor chips and sent them for pathology. We then re-inspected the bladder and found no residula bleeding.  the  bladder was then drained, a 22 French foley was placed and this concluded the procedure which was well tolerated by patient.  Complications: None  Condition: Stable, extubated, transferred to PACU  Plan: Patient is to be discharged home and followup in 5 days for foley catheter removal and pathology discussion.

## 2021-09-01 ENCOUNTER — Encounter (HOSPITAL_COMMUNITY): Payer: Self-pay | Admitting: Urology

## 2021-09-01 NOTE — Anesthesia Postprocedure Evaluation (Signed)
Anesthesia Post Note  Patient: Troy [name]  Procedure(s) Performed: CYSTOSCOPY (Urethra) TRANSURETHRAL RESECTION OF BLADDER TUMOR (TURBT) (Bladder)  Patient location during evaluation: Phase II Anesthesia Type: General Level of consciousness: awake Pain management: pain level controlled Vital Signs Assessment: post-procedure vital signs reviewed and stable Respiratory status: spontaneous breathing and respiratory function stable Cardiovascular status: blood pressure returned to baseline and stable Postop Assessment: no headache and no apparent nausea or vomiting Anesthetic complications: no Comments: Late entry   No notable events documented.   Last Vitals:  Vitals:   08/31/21 0945 08/31/21 1028  BP: (!) 148/64 (!) 170/69  Pulse: (!) 52 (!) 54  Resp: 17 18  Temp:  36.4 C  SpO2: 93% 95%    Last Pain:  Vitals:   08/31/21 1028  TempSrc: Axillary  PainSc: [place]

## 2021-09-04 ENCOUNTER — Encounter: Payer: Self-pay | Admitting: Urology

## 2021-09-04 LAB — SURGICAL PATHOLOGY

## 2021-09-04 NOTE — Progress Notes (Signed)
08/25/2021 8:44 AM   Troy [name] 11/09/1940 [id number]  Referring provider: Sharilyn Sites, MD [address] [place],  [place] [address]  hematuria   HPI: Troy [name] is a 81yo here for worsening gross hematuria. After his cystoscopy he presented to the ER with worsening gross hematuria and he foley was exchanged. His foley this morning stopped draining. We replaced his foley with a 73fench and multiple small clots were irrigated from the bladder. No other complaints today   PMH: Past Medical History:  Diagnosis Date   AAA (abdominal aortic aneurysm) ([facility]    Followed by Dr. TSherren MochaEarly   Arthritis    CAD (coronary artery disease)    a. CABG 2000.   Cancer ([facility]    Prostate:  Radiation Tx   Carotid artery disease ([facility]    Chest pain    precordial. mild chronic ..[name].. nonischemic   CHF (congestive heart failure) (HCC)    Chronic edema    Coronary artery disease    a. Nuclear, January, 2008, no ischemia b. Cath 08/2012- 1/4 patent grafts, RCA CTO, no flow-limiting disease, medically managed   Diabetes mellitus without complication ([facility]    Dizziness 02/2011   Dyslipidemia    Fall    GERD (gastroesophageal reflux disease)    TAKES TUMS & ROLAIDS AS NEEDED   Hx of CABG 2000   Hypertension    Myocardial infarction ([facility] 1999   Neck pain 02/2011   Pneumonia    Pulmonary hypertension ([facility]    Renal artery stenosis (HCC)    50-70%   S/P femoropopliteal bypass surgery    Dr. EDonnetta Hutching  Sinus bradycardia    Asymptomatic    Surgical History: Past Surgical History:  Procedure Laterality Date   CARDIAC CATHETERIZATION  09/26/2012   1/4 patent bypass (occluded SVG-PDA, SVG-OM, LIMA-LAD), SVG-diagonal patent and fills the diagonal and LAD, distal RCA occlusion with left to right collateralization, patent circumflex, LAD with no flow-limiting disease and antegrade flow competitively from SVG-diagonal; EF 60-65%   COLONOSCOPY  11/30/2009   RXBJ:[id number] next TCS 11/2019    COLONOSCOPY WITH PROPOFOL N/A 12/18/2019   Procedure: COLONOSCOPY WITH PROPOFOL;  Surgeon: CHarvel Quale MD;  [facility];  Service: Gastroenterology;  Laterality: N/A;  1030   CORONARY ARTERY BYPASS GRAFT  2000   CYSTOSCOPY N/A 08/31/2021   Procedure: CYSTOSCOPY;  Surgeon: MCleon Gustin MD;  Location: [facility];  Service: Urology;  Laterality: N/A;  pt knows to arrive at 7:00   ESOPHAGOGASTRODUODENOSCOPY N/A 08/12/2013   RHQI:[id number]-MWUXLKGMWpeptic stricture with erosive refluxesophagitis - status post Maloney dilation. Hiatal hernia. Abnormalgastric mucosa. Deformity of the pyloric channel suggestive ofprior peptic ulcer disease. Duodenal bulbar diverticulum Statuspost gastric biopsy. h.pylori   LEFT HEART CATHETERIZATION WITH CORONARY/GRAFT ANGIOGRAM N/A 09/26/2012   Procedure: LEFT HEART CATHETERIZATION WITH CBeatrix Fetters  Surgeon: CBurnell Blanks MD;  [facility];  Service: Cardiovascular;  Laterality: N/A;   MALONEY DILATION N/A 08/12/2013   Procedure: MVenia MinksDILATION;  Surgeon: RDaneil Dolin MD;  [facility]: [facility];  Service: Endoscopy;  Laterality: N/A;   POLYPECTOMY  12/18/2019   Procedure: POLYPECTOMY;  Surgeon: CHarvel Quale MD;  [facility];  Service: Gastroenterology;;  ascending colon polyp    PR VEIN BYPASS GRAFT,AORTO-FEM-POP Right 07/19/1999   PR VEIN BYPASS GRAFT,AORTO-FEM-POP Left 05/02/2006   RIGHT HEART CATH AND CORONARY/GRAFT ANGIOGRAPHY N/A 04/11/2020   Procedure: RIGHT HEART CATH AND CORONARY/GRAFT ANGIOGRAPHY;  Surgeon: MLarey Dresser  MD;  Location: [place] [facility] LAB;  Service: Cardiovascular;  Laterality: N/A;   SAVORY DILATION N/A 08/12/2013   Procedure: SAVORY DILATION;  Surgeon: Daneil Dolin, MD;  [facility];  Service: Endoscopy;  Laterality: N/A;   TRANSURETHRAL RESECTION OF BLADDER TUMOR N/A 08/31/2021   Procedure: TRANSURETHRAL RESECTION OF BLADDER TUMOR (TURBT);   Surgeon: Cleon Gustin, MD;  Location: [facility];  Service: Urology;  Laterality: N/A;   WRIST SURGERY     cyst removal    Home Medications:  Allergies as of 08/25/2021       Reactions   Niacin Itching, Rash   Burning sensation   Contrast Media [iodinated Contrast Media] Nausea And Vomiting   Iodine-131 Nausea And Vomiting   Nsaids Other (See Comments)   Taking Coumadin        Medication List        Accurate as of Aug 25, 2021 11:59 PM. If you have any questions, ask your nurse or doctor.          acetaminophen 325 MG tablet Commonly known as: TYLENOL Take 2 tablets (650 mg total) by mouth every 6 (six) hours as needed for mild pain, fever or headache (or Fever >/= 101).   albuterol 108 (90 Base) MCG/ACT inhaler Commonly known as: VENTOLIN HFA Inhale 2 puffs into the lungs every 6 (six) hours as needed for wheezing or shortness of breath.   amLODipine 10 MG tablet Commonly known as: NORVASC Take 10 mg by mouth daily.   amLODipine 5 MG tablet Commonly known as: NORVASC Take 1 tablet (5 mg total) by mouth daily. For BP   aspirin EC 81 MG tablet Take 1 tablet (81 mg total) by mouth daily with breakfast.   atorvastatin 40 MG tablet Commonly known as: LIPITOR Take 1 tablet (40 mg total) by mouth daily.   famotidine 20 MG tablet Commonly known as: Pepcid One after supper   HYDROcodone-acetaminophen 5-325 MG tablet Commonly known as: NORCO/VICODIN Take 1 tablet by mouth daily as needed for moderate pain.   HYDROcodone-acetaminophen 5-325 MG tablet Commonly known as: NORCO/VICODIN Take 1 tablet by mouth daily as needed for moderate pain.   ipratropium 0.03 % nasal spray Commonly known as: ATROVENT Place 2 sprays into both nostrils as needed for rhinitis.   ipratropium-albuterol 0.5-2.5 (3) MG/3ML Soln Commonly known as: DUONEB Take 3 mLs by nebulization every 4 (four) hours as needed.   isosorbide mononitrate 60 MG 24 hr tablet Commonly known as:  IMDUR Take 2 tablets (120 mg total) by mouth every evening.   losartan 25 MG tablet Commonly known as: COZAAR Take 1 tablet (25 mg total) by mouth daily.   metFORMIN 1000 MG tablet Commonly known as: GLUCOPHAGE Take 500 mg by mouth 2 (two) times daily with a meal.   metoprolol succinate 25 MG 24 hr tablet Commonly known as: TOPROL-XL Take 1 tablet (25 mg total) by mouth daily. Pt. Needs to make an appt. With Cardiologist in order to receive further refills. Thank You. 1st Attempt.   nitroGLYCERIN 0.4 MG SL tablet Commonly known as: NITROSTAT Place 1 tablet (0.4 mg total) under the tongue every 5 (five) minutes x 3 doses as needed for chest pain.   OXYGEN Inhale 2 L into the lungs at bedtime.   pantoprazole 40 MG tablet Commonly known as: PROTONIX Take 1 tablet (40 mg total) by mouth daily. Take 30-60 min before first meal of the day   potassium chloride 10 MEQ tablet Commonly  known as: KLOR-CON M Take 1 tablet (10 mEq total) by mouth daily. Only take while taking Torsemide   torsemide 10 MG tablet Commonly known as: DEMADEX Take 1 tablet (10 mg total) by mouth every other day. Take 20 mg for weight gain over 3 lbs in 24 hours   warfarin 1 MG tablet Commonly known as: COUMADIN Take 1 mg by mouth every evening. MANAGED BY PMD        Allergies:  Allergies  Allergen Reactions   Niacin Itching and Rash    Burning sensation   Contrast Media [Iodinated Contrast Media] Nausea And Vomiting   Iodine-131 Nausea And Vomiting   Nsaids Other (See Comments)    Taking Coumadin    Family History: Family History  Problem Relation Age of Onset   Deep vein thrombosis Father    Lung cancer Sister    Diabetes Sister    Heart disease Sister        After age 5   Hyperlipidemia Sister    Hypertension Sister    Lung cancer Sister    Breast cancer Sister    Hypertension Mother    Diabetes Sister    Coronary artery disease Other        family hx of   Cancer Brother         "crab cancer"   Heart disease Brother    Heart attack Brother    Heart attack Daughter    Colon cancer Neg Hx    Stroke Neg Hx     Social History:  reports that he quit smoking about 3 years ago. His smoking use included cigarettes. He has a 140.00 pack-year smoking history. He has never used smokeless tobacco. He reports current alcohol use. He reports that he does not use drugs.  ROS: All other review of systems were reviewed and are negative except what is noted above in HPI  Physical Exam: BP (!) 190/67   Pulse 79   Constitutional:  Alert and oriented, No acute distress. HEENT: [place] AT, moist mucus membranes.  Trachea midline, no masses. Cardiovascular: No clubbing, cyanosis, or edema. Respiratory: Normal respiratory effort, no increased work of breathing. GI: Abdomen is soft, nontender, nondistended, no abdominal masses GU: No CVA tenderness.  Lymph: No cervical or inguinal lymphadenopathy. Skin: No rashes, bruises or suspicious lesions. Neurologic: Grossly intact, no focal deficits, moving all 4 extremities. Psychiatric: Normal mood and affect.  Laboratory Data: Lab Results  Component Value Date   WBC 8.4 08/23/2021   HGB 13.4 08/23/2021   HCT 41.7 08/23/2021   MCV 91 08/23/2021   PLT 316 04/11/2020    Lab Results  Component Value Date   CREATININE 0.75 08/29/2021    No results found for: PSA  No results found for: TESTOSTERONE  Lab Results  Component Value Date   HGBA1C 6.5 (H) 08/29/2021    Urinalysis    Component Value Date/Time   APPEARANCEUR Cloudy (A) 08/24/2021 1546   GLUCOSEU Negative 08/24/2021 1546   BILIRUBINUR Negative 08/24/2021 1546   PROTEINUR 3+ (A) 08/24/2021 1546   NITRITE Negative 08/24/2021 1546   LEUKOCYTESUR Negative 08/24/2021 1546    Lab Results  Component Value Date   LABMICR See below: 08/24/2021   WBCUA None seen 08/24/2021   LABEPIT 0-10 08/24/2021   BACTERIA None seen 08/24/2021    Pertinent Imaging:  No results  found for this or any previous visit.  No results found for this or any previous visit.  No results found for  this or any previous visit.  No results found for this or any previous visit.  No results found for this or any previous visit.  No results found for this or any previous visit.  Results for orders placed during the hospital encounter of 08/18/21  CT HEMATURIA WORKUP  Narrative CLINICAL DATA:  Gross hematuria since early May, history of prostate cancer * Tracking Code: BO *  EXAM: CT ABDOMEN AND PELVIS WITHOUT AND WITH CONTRAST  TECHNIQUE: Multidetector CT imaging of the abdomen and pelvis was performed following the standard protocol before and following the bolus administration of intravenous contrast.  RADIATION DOSE REDUCTION: This exam was performed according to the departmental dose-optimization program which includes automated exposure control, adjustment of the mA and/or kV according to patient size and/or use of iterative reconstruction technique.  CONTRAST:  1107m OMNIPAQUE IOHEXOL 300 MG/ML  SOLN  COMPARISON:  None Available.  FINDINGS: Lower chest: No acute abnormality. Cardiomegaly. Coronary artery calcifications.  Hepatobiliary: No solid liver abnormality is seen. Small, definitively benign cyst or hemangioma of the central liver dome for which no further follow-up or characterization is required (series 15, image 30). No gallstones, gallbladder wall thickening, or biliary dilatation.  Pancreas: Unremarkable. No pancreatic ductal dilatation or surrounding inflammatory changes.  Spleen: Normal in size without significant abnormality.  Adrenals/Urinary Tract: Adrenal glands are unremarkable. Small simple, benign bilateral renal cortical cysts, as well as intrinsically hyperdense, nonenhancing hemorrhagic or proteinaceous cysts of the midportions of the left and right kidneys (series 2, image 37). No urinary tract filling defect on delayed  phase imaging. Bladder is unremarkable.  Stomach/Bowel: Stomach is within normal limits. Appendix appears normal. No evidence of bowel wall thickening, distention, or inflammatory changes. Descending and sigmoid diverticulosis.  Vascular/Lymphatic: Aortic atherosclerosis. 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 No enlarged abdominal or pelvic lymph nodes.  Reproductive: Prostate brachytherapy.  Other: Small, fat containing bilateral inguinal hernias. No ascites.  Musculoskeletal: No acute or significant osseous findings.  IMPRESSION: 1. No evidence of urinary tract mass, calculus, or hydronephrosis. No urinary tract filling defect on delayed phase imaging. 2. Prostate brachytherapy. No evidence of mass or lymphadenopathy in the abdomen or pelvis. 3. Small simple, benign bilateral renal cortical cysts, as well as nonenhancing, benign hemorrhagic or proteinaceous cysts. No further follow-up or characterization is required for these benign renal cysts. 4. 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 Recommend referral to a vascular specialist. This recommendation follows ACR consensus guidelines: White Paper of the ACR Incidental Findings Committee II on Vascular Findings. J Am Coll Radiol 2013; 10:789-794. 5. Cardiomegaly and coronary artery disease.  Aortic Atherosclerosis (ICD10-I70.0).   Electronically Signed By: ADelanna AhmadiM.D. On:  08/19/2021 11:29  No results found for this or any previous visit.   Assessment & Plan:    1. Gross hematuria -His hematuria is related to prostatic bleeding and he is scheduled for cystoscopy with fulgeration in 1 week.  2. Urinary retention Continue indwelling foley   No follow-ups  on file.  Nicolette Bang, MD  [facility] [place]

## 2021-09-04 NOTE — Patient Instructions (Signed)

## 2021-09-06 ENCOUNTER — Other Ambulatory Visit (HOSPITAL_COMMUNITY): Payer: Self-pay | Admitting: Family Medicine

## 2021-09-06 ENCOUNTER — Ambulatory Visit (INDEPENDENT_AMBULATORY_CARE_PROVIDER_SITE_OTHER): Payer: No Typology Code available for payment source | Admitting: Urology

## 2021-09-06 ENCOUNTER — Encounter: Payer: Self-pay | Admitting: Urology

## 2021-09-06 VITALS — BP 118/60 | HR 55 | Ht 70.5 in | Wt 200.0 lb

## 2021-09-06 DIAGNOSIS — I509 Heart failure, unspecified: Secondary | ICD-10-CM | POA: Diagnosis not present

## 2021-09-06 DIAGNOSIS — R310 Gross hematuria: Secondary | ICD-10-CM

## 2021-09-06 DIAGNOSIS — Z79899 Other long term (current) drug therapy: Secondary | ICD-10-CM | POA: Diagnosis not present

## 2021-09-06 NOTE — Progress Notes (Signed)
Fill and Pull Catheter Removal  Patient is present today for a catheter removal.  Patient was cleaned and prepped in a sterile fashion 37m of sterile water/ saline was instilled into the bladder when the patient felt the urge to urinate. 165mof water was then drained from the balloon.  A 22FR foley cath was removed from the bladder no complications were noted .  Patient as then given some time to void on their own.  Patient can void  20022mn their own after some time.  Patient tolerated well.  Performed by: AmaEstill BambergN  Follow up/ Additional notes: MD to see

## 2021-09-06 NOTE — Progress Notes (Signed)
09/06/2021 11:01 AM   Troy [name] [birth year] [id number]  Referring provider: Sharilyn Sites, MD [address] [place],  [place] [address]  Followup bladder biopsy   HPI: Troy [name] is a 81yo here for followup after bladder biopsy and fulgeration. Pathology from the biopsy was benign. No hematuria since the procedure. No other complaints today.    PMH: Past Medical History:  Diagnosis Date   AAA (abdominal aortic aneurysm) ([place])    Followed by Dr. Sherren Mocha Early   Arthritis    CAD (coronary artery disease)    a. CABG 2000.   Cancer [facility])    Prostate:  Radiation Tx   Carotid artery disease ([place])    Chest pain    precordial. mild chronic .[name]... nonischemic   CHF (congestive heart failure) (H[facility])    Chronic edema    Coronary artery disease    a. Nuclear, January, 2008, no ischemia b. Cath 08/2012- 1/4 patent grafts, RCA CTO, no flow-limiting disease, medically managed   Diabetes mellitus without complication ([place])    Dizziness 02/2011   Dyslipidemia    Fall    GERD (gastroesophageal reflux disease)    TAKES TUMS & ROLAIDS AS NEEDED   Hx of CABG 2000   Hypertension    Myocardial infarction ([place]) 1999   Neck pain 02/2011   Pneumonia    Pulmonary hypertension ([place])    Renal artery stenosis (HCC)    50-70%   S/P femoropopliteal bypass surgery    Dr. Donnetta Hutching   Sinus bradycardia    Asymptomatic    Surgical History: Past Surgical History:  Procedure Laterality Date   CARDIAC CATHETERIZATION  09/26/2012   1/4 patent bypass (occluded SVG-PDA, SVG-OM, LIMA-LAD), SVG-diagonal patent and fills the diagonal and LAD, distal RCA occlusion with left to right collateralization, patent circumflex, LAD with no flow-limiting disease and antegrade flow competitively from SVG-diagonal; EF 60-65%   COLONOSCOPY  11/30/2009   WPY:[id number]. next TCS 11/2019   COLONOSCOPY WITH PROPOFOL N/A 12/18/2019   Procedure: COLONOSCOPY WITH PROPOFOL;  Surgeon: Harvel Quale, MD;   [facility];  Service: Gastroenterology;  Laterality: N/A;  1030   CORONARY ARTERY BYPASS GRAFT  2000   CYSTOSCOPY N/A 08/31/2021   Procedure: CYSTOSCOPY;  Surgeon: Cleon Gustin, MD;  Location: [facility];  Service: Urology;  Laterality: N/A;  pt knows to arrive at 7:00   ESOPHAGOGASTRODUODENOSCOPY N/A 08/12/2013   QBH:[id number] peptic stricture with erosive refluxesophagitis - status post Maloney dilation. Hiatal hernia. Abnormalgastric mucosa. Deformity of the pyloric channel suggestive ofprior peptic ulcer disease. Duodenal bulbar diverticulum Statuspost gastric biopsy. h.pylori   LEFT HEART CATHETERIZATION WITH CORONARY/GRAFT ANGIOGRAM N/A 09/26/2012   Procedure: LEFT HEART CATHETERIZATION WITH Beatrix Fetters;  Surgeon: Burnell Blanks, MD;  Location: [facility] CATH LAB;  Service: Cardiovascular;  Laterality: N/A;   MALONEY DILATION N/A 08/12/2013   Procedure: Venia Minks DILATION;  Surgeon: Daneil Dolin, MD;  Location: [facility];  Service: Endoscopy;  Laterality: N/A;   POLYPECTOMY  12/18/2019   Procedure: POLYPECTOMY;  Surgeon: Harvel Quale, MD;  Location: [facility];  Service: Gastroenterology;;  ascending colon polyp    PR VEIN BYPASS GRAFT,AORTO-FEM-POP Right 07/19/1999   PR VEIN BYPASS GRAFT,AORTO-FEM-POP Left 05/02/2006   RIGHT HEART CATH AND CORONARY/GRAFT ANGIOGRAPHY N/A 04/11/2020   Procedure: RIGHT HEART CATH AND CORONARY/GRAFT ANGIOGRAPHY;  Surgeon: Larey Dresser, MD;  Location: [facility] LAB;  Service: Cardiovascular;  Laterality: N/A;   SAVORY DILATION N/A 08/12/2013   Procedure:  SAVORY DILATION;  Surgeon: Daneil Dolin, MD;  [facility];  Service: Endoscopy;  Laterality: N/A;   TRANSURETHRAL RESECTION OF BLADDER TUMOR N/A 08/31/2021   Procedure: TRANSURETHRAL RESECTION OF BLADDER TUMOR (TURBT);  Surgeon: Cleon Gustin, MD;  Location: [facility];  Service: Urology;  Laterality: N/A;   WRIST SURGERY     cyst removal     Home Medications:  Allergies as of 09/06/2021       Reactions   Niacin Itching, Rash   Burning sensation   Contrast Media [iodinated Contrast Media] Nausea And Vomiting   Iodine-131 Nausea And Vomiting   Nsaids Other (See Comments)   Taking Coumadin        Medication List        Accurate as of September 06, 2021 11:01 AM. If you have any questions, ask your nurse or doctor.          acetaminophen 325 MG tablet Commonly known as: TYLENOL Take 2 tablets (650 mg total) by mouth every 6 (six) hours as needed for mild pain, fever or headache (or Fever >/= 101).   albuterol 108 (90 Base) MCG/ACT inhaler Commonly known as: VENTOLIN HFA Inhale 2 puffs into the lungs every 6 (six) hours as needed for wheezing or shortness of breath.   amLODipine 10 MG tablet Commonly known as: NORVASC Take 10 mg by mouth daily.   amLODipine 5 MG tablet Commonly known as: NORVASC Take 1 tablet (5 mg total) by mouth daily. For BP   aspirin EC 81 MG tablet Take 1 tablet (81 mg total) by mouth daily with breakfast.   atorvastatin 40 MG tablet Commonly known as: LIPITOR Take 1 tablet (40 mg total) by mouth daily.   famotidine 20 MG tablet Commonly known as: Pepcid One after supper   HYDROcodone-acetaminophen 5-325 MG tablet Commonly known as: NORCO/VICODIN Take 1 tablet by mouth daily as needed for moderate pain.   ipratropium 0.03 % nasal spray Commonly known as: ATROVENT Place 2 sprays into both nostrils as needed for rhinitis.   ipratropium-albuterol 0.5-2.5 (3) MG/3ML Soln Commonly known as: DUONEB Take 3 mLs by nebulization every 4 (four) hours as needed.   isosorbide mononitrate 60 MG 24 hr tablet Commonly known as: IMDUR Take 2 tablets (120 mg total) by mouth every evening.   losartan 25 MG tablet Commonly known as: COZAAR Take 1 tablet (25 mg total) by mouth daily.   metFORMIN 1000 MG tablet Commonly known as: GLUCOPHAGE Take 500 mg by mouth 2 (two) times daily with a  meal.   metoprolol succinate 25 MG 24 hr tablet Commonly known as: TOPROL-XL Take 1 tablet (25 mg total) by mouth daily. Pt. Needs to make an appt. With Cardiologist in order to receive further refills. Thank You. 1st Attempt.   nitroGLYCERIN 0.4 MG SL tablet Commonly known as: NITROSTAT Place 1 tablet (0.4 mg total) under the tongue every 5 (five) minutes x 3 doses as needed for chest pain.   OXYGEN Inhale 2 L into the lungs at bedtime.   pantoprazole 40 MG tablet Commonly known as: PROTONIX Take 1 tablet (40 mg total) by mouth daily. Take 30-60 min before first meal of the day   potassium chloride 10 MEQ tablet Commonly known as: KLOR-CON M Take 1 tablet (10 mEq total) by mouth daily. Only take while taking Torsemide   torsemide 10 MG tablet Commonly known as: DEMADEX Take 1 tablet (10 mg total) by mouth every other day. Take 20 mg for  weight gain over 3 lbs in 24 hours   warfarin 1 MG tablet Commonly known as: COUMADIN Take 1 mg by mouth every evening. MANAGED BY PMD        Allergies:  Allergies  Allergen Reactions   Niacin Itching and Rash    Burning sensation   Contrast Media [Iodinated Contrast Media] Nausea And Vomiting   Iodine-131 Nausea And Vomiting   Nsaids Other (See Comments)    Taking Coumadin    Family History: Family History  Problem Relation Age of Onset   Deep vein thrombosis Father    Lung cancer Sister    Diabetes Sister    Heart disease Sister        After age 59   Hyperlipidemia Sister    Hypertension Sister    Lung cancer Sister    Breast cancer Sister    Hypertension Mother    Diabetes Sister    Coronary artery disease Other        family hx of   Cancer Brother        "crab cancer"   Heart disease Brother    Heart attack Brother    Heart attack Daughter    Colon cancer Neg Hx    Stroke Neg Hx     Social History:  reports that he quit smoking about 3 years ago. His smoking use included cigarettes. He has a 140.00 pack-year  smoking history. He has never used smokeless tobacco. He reports current alcohol use. He reports that he does not use drugs.  ROS: All other review of systems were reviewed and are negative except what is noted above in HPI  Physical Exam: BP 118/60   Pulse (!) 55   Ht 5' 10.5" (1.791 m)   Wt 200 lb (90.7 kg)   BMI 28.29 kg/m   Constitutional:  Alert and oriented, No acute distress. HEENT: [place] AT, moist mucus membranes.  Trachea midline, no masses. Cardiovascular: No clubbing, cyanosis, or edema. Respiratory: Normal respiratory effort, no increased work of breathing. GI: Abdomen is soft, nontender, nondistended, no abdominal masses GU: No CVA tenderness.  Lymph: No cervical or inguinal lymphadenopathy. Skin: No rashes, bruises or suspicious lesions. Neurologic: Grossly intact, no focal deficits, moving all 4 extremities. Psychiatric: Normal mood and affect.  Laboratory Data: Lab Results  Component Value Date   WBC 8.4 08/23/2021   HGB 13.4 08/23/2021   HCT 41.7 08/23/2021   MCV 91 08/23/2021   PLT 316 04/11/2020    Lab Results  Component Value Date   CREATININE 0.75 08/29/2021    No results found for: PSA  No results found for: TESTOSTERONE  Lab Results  Component Value Date   HGBA1C 6.5 (H) 08/29/2021    Urinalysis    Component Value Date/Time   APPEARANCEUR Cloudy (A) 08/24/2021 1546   GLUCOSEU Negative 08/24/2021 1546   BILIRUBINUR Negative 08/24/2021 1546   PROTEINUR 3+ (A) 08/24/2021 1546   NITRITE Negative 08/24/2021 1546   LEUKOCYTESUR Negative 08/24/2021 1546    Lab Results  Component Value Date   LABMICR See below: 08/24/2021   WBCUA None seen 08/24/2021   LABEPIT 0-10 08/24/2021   BACTERIA None seen 08/24/2021    Pertinent Imaging:  No results found for this or any previous visit.  No results found for this or any previous visit.  No results found for this or any previous visit.  No results found for this or any previous visit.  No  results found for this or any previous  visit.  No results found for this or any previous visit.  Results for orders placed during the hospital encounter of 08/18/21  CT HEMATURIA WORKUP  Narrative CLINICAL DATA:  Gross hematuria since early May, history of prostate cancer * Tracking Code: BO *  EXAM: CT ABDOMEN AND PELVIS WITHOUT AND WITH CONTRAST  TECHNIQUE: Multidetector CT imaging of the abdomen and pelvis was performed following the standard protocol before and following the bolus administration of intravenous contrast.  RADIATION DOSE REDUCTION: This exam was performed according to the departmental dose-optimization program which includes automated exposure control, adjustment of the mA and/or kV according to patient size and/or use of iterative reconstruction technique.  CONTRAST:  140m OMNIPAQUE IOHEXOL 300 MG/ML  SOLN  COMPARISON:  None Available.  FINDINGS: Lower chest: No acute abnormality. Cardiomegaly. Coronary artery calcifications.  Hepatobiliary: No solid liver abnormality is seen. Small, definitively benign cyst or hemangioma of the central liver dome for which no further follow-up or characterization is required (series 15, image 30). No gallstones, gallbladder wall thickening, or biliary dilatation.  Pancreas: Unremarkable. No pancreatic ductal dilatation or surrounding inflammatory changes.  Spleen: Normal in size without significant abnormality.  Adrenals/Urinary Tract: Adrenal glands are unremarkable. Small simple, benign bilateral renal cortical cysts, as well as intrinsically hyperdense, nonenhancing hemorrhagic or proteinaceous cysts of the midportions of the left and right kidneys (series 2, image 37). No urinary tract filling defect on delayed phase imaging. Bladder is unremarkable.  Stomach/Bowel: Stomach is within normal limits. Appendix appears normal. No evidence of bowel wall thickening, distention, or inflammatory changes.  Descending and sigmoid diverticulosis.  Vascular/Lymphatic: Aortic atherosclerosis. 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 No enlarged abdominal or pelvic lymph nodes.  Reproductive: Prostate brachytherapy.  Other: Small, fat containing bilateral inguinal hernias. No ascites.  Musculoskeletal: No acute or significant osseous findings.  IMPRESSION: 1. No evidence of urinary tract mass, calculus, or hydronephrosis. No urinary tract filling defect on delayed phase imaging. 2. Prostate brachytherapy. No evidence of mass or lymphadenopathy in the abdomen or pelvis. 3. Small simple, benign bilateral renal cortical cysts, as well as nonenhancing, benign hemorrhagic or proteinaceous cysts. No further follow-up or characterization is required for these benign renal cysts. 4. 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 Recommend referral to a vascular specialist. This recommendation follows ACR consensus guidelines: White Paper of the ACR Incidental Findings Committee II on Vascular Findings. J Am Coll Radiol 2013; 10:789-794. 5. Cardiomegaly and coronary artery disease.  Aortic Atherosclerosis (ICD10-I70.0).   Electronically Signed By: ADelanna AhmadiM.D. On: 08/19/2021 11:29  No results found for this or any previous visit.   Assessment & Plan:    1. Gross hematuria -resolved. Followup in 1 months with FISH cytology   No follow-ups on  file.  PNicolette Bang MD  [facility] [place]

## 2021-09-06 NOTE — Patient Instructions (Signed)

## 2021-09-07 ENCOUNTER — Other Ambulatory Visit (HOSPITAL_COMMUNITY): Payer: Self-pay | Admitting: Cardiology

## 2021-09-07 LAB — BASIC METABOLIC PANEL
BUN/Creatinine Ratio: 21 (ref 10–24)
BUN: 23 mg/dL (ref 8–27)
CO2: 27 mmol/L (ref 20–29)
Calcium: 9.6 mg/dL (ref 8.6–10.2)
Chloride: 99 mmol/L (ref 96–106)
Creatinine, Ser: 1.12 mg/dL (ref 0.76–1.27)
Glucose: 113 mg/dL — ABNORMAL HIGH (ref 70–99)
Potassium: 4.4 mmol/L (ref 3.5–5.2)
Sodium: 143 mmol/L (ref 134–144)
eGFR: 67 mL/min/{1.73_m2} (ref >59)

## 2021-09-07 LAB — SPECIMEN STATUS REPORT

## 2021-09-08 ENCOUNTER — Ambulatory Visit (INDEPENDENT_AMBULATORY_CARE_PROVIDER_SITE_OTHER): Payer: Medicare HMO | Admitting: Physician Assistant

## 2021-09-08 ENCOUNTER — Telehealth: Payer: Self-pay

## 2021-09-08 ENCOUNTER — Telehealth: Payer: Self-pay | Admitting: Internal Medicine

## 2021-09-08 VITALS — BP 150/64 | HR 58 | Ht 70.5 in | Wt 200.0 lb

## 2021-09-08 DIAGNOSIS — Z8603 Personal history of neoplasm of uncertain behavior: Secondary | ICD-10-CM

## 2021-09-08 DIAGNOSIS — Z9889 Other specified postprocedural states: Secondary | ICD-10-CM

## 2021-09-08 DIAGNOSIS — R32 Unspecified urinary incontinence: Secondary | ICD-10-CM

## 2021-09-08 DIAGNOSIS — R339 Retention of urine, unspecified: Secondary | ICD-10-CM | POA: Diagnosis not present

## 2021-09-08 LAB — URINALYSIS, ROUTINE W REFLEX MICROSCOPIC
Bilirubin, UA: NEGATIVE
Glucose, UA: NEGATIVE
Ketones, UA: NEGATIVE
Leukocytes,UA: NEGATIVE
Nitrite, UA: NEGATIVE
Specific Gravity, UA: 1.015 (ref 1.005–1.030)
Urobilinogen, Ur: 1 mg/dL (ref 0.2–1.0)
pH, UA: 7.5 (ref 5.0–7.5)

## 2021-09-08 LAB — MICROSCOPIC EXAMINATION
Renal Epithel, UA: NONE SEEN /hpf
WBC, UA: NONE SEEN /hpf (ref 0–5)

## 2021-09-08 LAB — BLADDER SCAN AMB NON-IMAGING: Scan Result: 107

## 2021-09-08 MED ORDER — OXYBUTYNIN CHLORIDE 5 MG PO TABS: 5.0000 mg | ORAL_TABLET | Freq: Three times a day (TID) | ORAL | 0 refills | Status: AC

## 2021-09-08 NOTE — Progress Notes (Signed)
post void residual =151m

## 2021-09-08 NOTE — Telephone Encounter (Signed)
Patient called with incontinence symptoms. Reviewed with Dr. [name], patient to come in today for PVR.

## 2021-09-08 NOTE — Progress Notes (Signed)
Assessment: 1. Urinary retention - Urinalysis, Routine w reflex microscopic - BLADDER SCAN AMB NON-IMAGING  2. Urinary incontinence, unspecified type  3. H/O transurethral resection of bladder tumor (TURBT)    Plan: Discussed pt's presentation with Dr. [name] who recommends tx for OAB.  Oxybutynin sent to the patient's pharmacy and side effect potential discussed at length.  He will take 5 mg 3 times a day for the next 1 to 2 days and then decrease to 1-2 a day as needed.  If his symptoms persist, he will call and let us know.  Otherwise he will keep his follow-up as already scheduled with Dr. [name].  Chief Complaint: No chief complaint on file.   HPI: Troy [name] is a 81 y.o. male status post TURBT with clot evacuation on 08/31/2021 who presents for evaluation of significant and constant incontinence since having catheter out 2 days ago.  Is wearing diapers constantly and has had to change close several times due to leakage through his incontinence pads.  He continues to have urinary burning, but denies gross hematuria.  Fever, chills, abdominal pain, nausea or vomiting.  Patient was able to collect a urine during office visit.  UA =clear PVR = 107 mL  Portions of the above documentation were copied from a prior visit for review purposes only.  Allergies: Allergies  Allergen Reactions   Niacin Itching and Rash    Burning sensation   Contrast Media [Iodinated Contrast Media] Nausea And Vomiting   Iodine-131 Nausea And Vomiting   Nsaids Other (See Comments)    Taking Coumadin    PMH: Past Medical History:  Diagnosis Date   AAA (abdominal aortic aneurysm) (HCC)    Followed by Dr. Sherren Mocha Early   Arthritis    CAD (coronary artery disease)    a. CABG 2000.   Cancer [facility])    Prostate:  Radiation Tx   Carotid artery disease ([place])    Chest pain    precordial. mild chronic .[name]... nonischemic   CHF (congestive heart failure) (HCC)    Chronic edema    Coronary artery  disease    a. Nuclear, January, 2008, no ischemia b. Cath 08/2012- 1/4 patent grafts, RCA CTO, no flow-limiting disease, medically managed   Diabetes mellitus without complication ([place])    Dizziness 02/2011   Dyslipidemia    Fall    GERD (gastroesophageal reflux disease)    TAKES TUMS & ROLAIDS AS NEEDED   Hx of CABG 2000   Hypertension    Myocardial infarction ([place]) 1999   Neck pain 02/2011   Pneumonia    Pulmonary hypertension ([place])    Renal artery stenosis (HCC)    50-70%   S/P femoropopliteal bypass surgery    Dr. Donnetta Hutching   Sinus bradycardia    Asymptomatic    PSH: Past Surgical History:  Procedure Laterality Date   CARDIAC CATHETERIZATION  09/26/2012   1/4 patent bypass (occluded SVG-PDA, SVG-OM, LIMA-LAD), SVG-diagonal patent and fills the diagonal and LAD, distal RCA occlusion with left to right collateralization, patent circumflex, LAD with no flow-limiting disease and antegrade flow competitively from SVG-diagonal; EF 60-65%   COLONOSCOPY  11/30/2009   XTG:GYIRSWNIOEVOJJ. next TCS 11/2019   COLONOSCOPY WITH PROPOFOL N/A 12/18/2019   Procedure: COLONOSCOPY WITH PROPOFOL;  Surgeon: Harvel Quale, MD;  [facility]: [facility];  Service: Gastroenterology;  Laterality: N/A;  1030   CORONARY ARTERY BYPASS GRAFT  2000   CYSTOSCOPY N/A 08/31/2021   Procedure: CYSTOSCOPY;  Surgeon: Cleon Gustin,  MD;  Location: [facility];  Service: Urology;  Laterality: N/A;  pt knows to arrive at 7:00   ESOPHAGOGASTRODUODENOSCOPY N/A 08/12/2013   GUY:[id number] peptic stricture with erosive refluxesophagitis - status post Maloney dilation. Hiatal hernia. Abnormalgastric mucosa. Deformity of the pyloric channel suggestive ofprior peptic ulcer disease. Duodenal bulbar diverticulum Statuspost gastric biopsy. h.pylori   LEFT HEART CATHETERIZATION WITH CORONARY/GRAFT ANGIOGRAM N/A 09/26/2012   Procedure: LEFT HEART CATHETERIZATION WITH Beatrix Fetters;  Surgeon: Burnell Blanks, MD;  Location: [facility] CATH LAB;  Service: Cardiovascular;  Laterality: N/A;   MALONEY DILATION N/A 08/12/2013   Procedure: Venia Minks DILATION;  Surgeon: Daneil Dolin, MD;  Location: [facility];  Service: Endoscopy;  Laterality: N/A;   POLYPECTOMY  12/18/2019   Procedure: POLYPECTOMY;  Surgeon: Harvel Quale, MD;  Location: [facility];  Service: Gastroenterology;;  ascending colon polyp    PR VEIN BYPASS GRAFT,AORTO-FEM-POP Right 07/19/1999   PR VEIN BYPASS GRAFT,AORTO-FEM-POP Left 05/02/2006   RIGHT HEART CATH AND CORONARY/GRAFT ANGIOGRAPHY N/A 04/11/2020   Procedure: RIGHT HEART CATH AND CORONARY/GRAFT ANGIOGRAPHY;  Surgeon: Larey Dresser, MD;  Location: [place] CV LAB;  Service: Cardiovascular;  Laterality: N/A;   SAVORY DILATION N/A 08/12/2013   Procedure: SAVORY DILATION;  Surgeon: Daneil Dolin, MD;  [facility];  Service: Endoscopy;  Laterality: N/A;   TRANSURETHRAL RESECTION OF BLADDER TUMOR N/A 08/31/2021   Procedure: TRANSURETHRAL RESECTION OF BLADDER TUMOR (TURBT);  Surgeon: Cleon Gustin, MD;  Location: [facility];  Service: Urology;  Laterality: N/A;   WRIST SURGERY     cyst removal    SH: Social History   Tobacco Use   Smoking status: Former    Packs/day: 2.00    Years: 70.00    Total pack years: 140.00    Types: Cigarettes    Quit date: 04/21/2018    Years since quitting: 3.3   Smokeless tobacco: Never  Vaping Use   Vaping Use: Never used  Substance Use Topics   Alcohol use: Yes    Alcohol/week: 0.0 - 2.0 standard drinks of alcohol    Comment: OCCASIONAL   Drug use: Never    ROS: See HPI  PE: BP (!) 150/64   Pulse (!) 58   Ht 5' 10.5" (1.791 m)   Wt 200 lb (90.7 kg)   BMI 28.29 kg/m  GENERAL APPEARANCE:  Well appearing, well developed, well nourished, NAD HEENT:  Atraumatic, normocephalic NECK:  Supple. Trachea midline ABDOMEN:  Soft, non-tender, no masses EXTREMITIES:  Moves all extremities well, without  clubbing, cyanosis, or edema NEUROLOGIC:  Alert and oriented x 3, normal gait, CN II-XII grossly intact MENTAL STATUS:  appropriate BACK:  Non-tender to palpation, No CVAT SKIN:  Warm, dry, and intact   Results: Laboratory Data: Lab Results  Component Value Date   WBC 8.4 08/23/2021   HGB 13.4 08/23/2021   HCT 41.7 08/23/2021   MCV 91 08/23/2021   PLT 316 04/11/2020    Lab Results  Component Value Date   CREATININE 1.12 09/06/2021    Lab Results  Component Value Date   HGBA1C 6.5 (H) 08/29/2021    Urinalysis    Component Value Date/Time   APPEARANCEUR Cloudy (A) 08/24/2021 1546   GLUCOSEU Negative 08/24/2021 1546   BILIRUBINUR Negative 08/24/2021 1546   PROTEINUR 3+ (A) 08/24/2021 1546   NITRITE Negative 08/24/2021 1546   LEUKOCYTESUR Negative 08/24/2021 1546    Lab Results  Component Value Date   LABMICR See below: 08/24/2021  [place] None seen 08/24/2021   LABEPIT 0-10 08/24/2021   BACTERIA None seen 08/24/2021    Pertinent Imaging:  No results found for this or any previous visit.  No results found for this or any previous visit.  No results found for this or any previous visit.  No results found for this or any previous visit.  No results found for this or any previous visit.  No results found for this or any previous visit.  Results for orders placed during the hospital encounter of 08/18/21  CT HEMATURIA WORKUP  Narrative CLINICAL DATA:  Gross hematuria since early May, history of prostate cancer * Tracking Code: BO *  EXAM: CT ABDOMEN AND PELVIS WITHOUT AND WITH CONTRAST  TECHNIQUE: Multidetector CT imaging of the abdomen and pelvis was performed following the standard protocol before and following the bolus administration of intravenous contrast.  RADIATION DOSE REDUCTION: This exam was performed according to the departmental dose-optimization program which includes automated exposure control, adjustment of the mA and/or kV  according to patient size and/or use of iterative reconstruction technique.  CONTRAST:  112m OMNIPAQUE IOHEXOL 300 MG/ML  SOLN  COMPARISON:  None Available.  FINDINGS: Lower chest: No acute abnormality. Cardiomegaly. Coronary artery calcifications.  Hepatobiliary: No solid liver abnormality is seen. Small, definitively benign cyst or hemangioma of the central liver dome for which no further follow-up or characterization is required (series 15, image 30). No gallstones, gallbladder wall thickening, or biliary dilatation.  Pancreas: Unremarkable. No pancreatic ductal dilatation or surrounding inflammatory changes.  Spleen: Normal in size without significant abnormality.  Adrenals/Urinary Tract: Adrenal glands are unremarkable. Small simple, benign bilateral renal cortical cysts, as well as intrinsically hyperdense, nonenhancing hemorrhagic or proteinaceous cysts of the midportions of the left and right kidneys (series 2, image 37). No urinary tract filling defect on delayed phase imaging. Bladder is unremarkable.  Stomach/Bowel: Stomach is within normal limits. Appendix appears normal. No evidence of bowel wall thickening, distention, or inflammatory changes. Descending and sigmoid diverticulosis.  Vascular/Lymphatic: Aortic atherosclerosis. 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 No enlarged abdominal or pelvic lymph nodes.  Reproductive: Prostate brachytherapy.  Other: Small, fat containing bilateral inguinal hernias. No ascites.  Musculoskeletal: No acute or significant osseous findings.  IMPRESSION: 1. No evidence of urinary tract mass, calculus, or hydronephrosis. No urinary tract filling  defect on delayed phase imaging. 2. Prostate brachytherapy. No evidence of mass or lymphadenopathy in the abdomen or pelvis. 3. Small simple, benign bilateral renal cortical cysts, as well as nonenhancing, benign hemorrhagic or proteinaceous cysts. No further follow-up or characterization is required for these benign renal cysts. 4. 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 Recommend referral to a vascular specialist. This recommendation follows ACR consensus guidelines: White Paper of the ACR Incidental Findings Committee II on Vascular Findings. J Am Coll Radiol 2013; 10:789-794. 5. Cardiomegaly and coronary artery disease.  Aortic Atherosclerosis (ICD10-I70.0).   Electronically Signed By: ADelanna AhmadiM.D. On: 08/19/2021 11:29  No results found for  this or any previous visit.  Results for orders placed or performed in visit on 09/08/21 (from the past 24 hour(s))  BLADDER SCAN AMB NON-IMAGING   Collection Time: 09/08/21 11:03 AM  Result Value Ref Range   Scan Result 107

## 2021-09-08 NOTE — Telephone Encounter (Signed)
ONO on 3lpm done by [facility] on 08/27/21 was normal per MW  Pt aware  ONO placed in the scan folder

## 2021-10-04 ENCOUNTER — Ambulatory Visit (INDEPENDENT_AMBULATORY_CARE_PROVIDER_SITE_OTHER): Payer: Medicare HMO | Admitting: Urology

## 2021-10-04 VITALS — BP 151/72 | HR 59

## 2021-10-04 DIAGNOSIS — Z79899 Other long term (current) drug therapy: Secondary | ICD-10-CM | POA: Diagnosis not present

## 2021-10-04 DIAGNOSIS — R32 Unspecified urinary incontinence: Secondary | ICD-10-CM

## 2021-10-04 DIAGNOSIS — R8289 Other abnormal findings on cytological and histological examination of urine: Secondary | ICD-10-CM | POA: Diagnosis not present

## 2021-10-04 LAB — URINALYSIS, ROUTINE W REFLEX MICROSCOPIC
Bilirubin, UA: NEGATIVE
Glucose, UA: NEGATIVE
Nitrite, UA: NEGATIVE
Specific Gravity, UA: 1.01 (ref 1.005–1.030)
Urobilinogen, Ur: 2 mg/dL — ABNORMAL HIGH (ref 0.2–1.0)
pH, UA: 5 (ref 5.0–7.5)

## 2021-10-04 MED ORDER — MIRABEGRON ER 25 MG PO TB24: 25.0000 mg | ORAL_TABLET | Freq: Every day | ORAL | 0 refills | Status: DC

## 2021-10-04 NOTE — Progress Notes (Signed)
post void residual=21 °

## 2021-10-04 NOTE — Patient Instructions (Addendum)

## 2021-10-09 ENCOUNTER — Telehealth: Payer: Self-pay

## 2021-10-09 NOTE — Telephone Encounter (Signed)
Patient needing to discuss medication recently prescribed.  Please advise asap.  Call back:  [phone]  Thanks, Helene Kelp

## 2021-10-10 ENCOUNTER — Encounter: Payer: Self-pay | Admitting: Urology

## 2021-10-10 NOTE — Telephone Encounter (Signed)
Left voicemail for patient to return call with questions.  Waiting for call back.

## 2021-10-10 NOTE — Progress Notes (Signed)
$'@ENCDATE'o$ @ 9:48 AM   Troy [name] 06/06/1940 [id number]  Referring provider: Sharilyn Sites, MD [address] [place],  [place] [address]  Followup urinary incontinence and positive urine cytology  HPI: Mr Ayars is a 81yo here for followup for urinary incontinence and positive urine cytology. He noted no improvement in his urge incontinence with oxybuytnin TID PRN. He had severe drymouth and stopped the medication. He is here for urine specimen for hx of positive urine cytology   PMH: Past Medical History: Diagnosis Date  AAA (abdominal aortic aneurysm) ([place])   Followed by Dr. Sherren Mocha Early  Arthritis   CAD (coronary artery disease)   a. CABG 2000.  Cancer [facility])   Prostate:  Radiation Tx  Carotid artery disease ([place])   Chest pain   precordial. mild chronic .[name]... nonischemic  CHF (congestive heart failure) ([facility])   Chronic edema   Coronary artery disease   a. Nuclear, January, 2008, no ischemia b. Cath 08/2012- 1/4 patent grafts, RCA CTO, no flow-limiting disease, medically managed  Diabetes mellitus without complication ([place])   Dizziness 02/2011  Dyslipidemia   Fall   GERD (gastroesophageal reflux disease)   TAKES TUMS & ROLAIDS AS NEEDED  Hx of CABG 2000  Hypertension   Myocardial infarction ([place]) 1999  Neck pain 02/2011  Pneumonia   Pulmonary hypertension ([place])   Renal artery stenosis (H[facility])   50-70%  S/P femoropopliteal bypass surgery   Dr. Donnetta Hutching  Sinus bradycardia   Asymptomatic   Surgical History: Past Surgical History: Procedure Laterality Date  CARDIAC CATHETERIZATION  09/26/2012  1/4 patent bypass (occluded SVG-PDA, SVG-OM, LIMA-LAD), SVG-diagonal patent and fills the diagonal and LAD, distal RCA occlusion with left to right collateralization, patent circumflex, LAD with no flow-limiting disease and antegrade flow competitively from SVG-diagonal; EF 60-65%  COLONOSCOPY  11/30/2009  MAU:[id number]. next TCS 11/2019  COLONOSCOPY WITH  PROPOFOL N/A 12/18/2019  Procedure: COLONOSCOPY WITH PROPOFOL;  Surgeon: Harvel Quale, MD;  [facility]: [facility];  Service: Gastroenterology;  Laterality: N/A;  1030  CORONARY ARTERY BYPASS GRAFT  2000  CYSTOSCOPY N/A 08/31/2021  Procedure: CYSTOSCOPY;  Surgeon: Cleon Gustin, MD;  Location: [facility];  Service: Urology;  Laterality: N/A;  pt knows to arrive at 7:00  ESOPHAGOGASTRODUODENOSCOPY N/A 08/12/2013  HTD:[id number] peptic stricture with erosive refluxesophagitis - status post Maloney dilation. Hiatal hernia. Abnormalgastric mucosa. Deformity of the pyloric channel suggestive ofprior peptic ulcer disease. Duodenal bulbar diverticulum Statuspost gastric biopsy. h.pylori  LEFT HEART CATHETERIZATION WITH CORONARY/GRAFT ANGIOGRAM N/A 09/26/2012  Procedure: LEFT HEART CATHETERIZATION WITH Beatrix Fetters;  Surgeon: Burnell Blanks, MD;  Location: [facility] C[facility] LAB;  Service: Cardiovascular;  Laterality: N/A;  MALONEY DILATION N/A 08/12/2013  Procedure: Venia Minks DILATION;  Surgeon: Daneil Dolin, MD;  Location: [facility];  Service: Endoscopy;  Laterality: N/A;  POLYPECTOMY  12/18/2019  Procedure: POLYPECTOMY;  Surgeon: Harvel Quale, MD;  Location: [facility];  Service: Gastroenterology;;  ascending colon polyp   PR VEIN BYPASS GRAFT,AORTO-FEM-POP Right 07/19/1999  PR VEIN BYPASS GRAFT,AORTO-FEM-POP Left 05/02/2006  RIGHT HEART CATH AND CORONARY/GRAFT ANGIOGRAPHY N/A 04/11/2020  Procedure: RIGHT HEART CATH AND CORONARY/GRAFT ANGIOGRAPHY;  Surgeon: Larey Dresser, MD;  Location: [facility] [facility] CV LAB;  Service: Cardiovascular;  Laterality: N/A;  SAVORY DILATION N/A 08/12/2013  Procedure: SAVORY DILATION;  Surgeon: Daneil Dolin, MD;  [facility]: [facility];  Service: Endoscopy;  Laterality: N/A;  TRANSURETHRAL RESECTION OF BLADDER TUMOR N/A 08/31/2021  Procedure: TRANSURETHRAL RESECTION OF BLADDER TUMOR (TURBT);  Surgeon:  Dennie Vecchio, Candee Furbish, MD;   Location: [facility];  Service: Urology;  Laterality: N/A;  WRIST SURGERY    cyst removal   Home Medications:  Allergies as of 10/04/2021      Reactions  Niacin Itching, Rash  Burning sensation  Contrast Media [iodinated Contrast Media] Nausea And Vomiting  Iodine-131 Nausea And Vomiting  Nsaids Other (See Comments)  Taking Coumadin     Medication List      Accurate as of October 04, 2021  9:48 AM. If you have any questions, ask your nurse or doctor.      acetaminophen 325 MG tablet Commonly known as: TYLENOL Take 2 tablets (650 mg total) by mouth every 6 (six) hours as needed for mild pain, fever or headache (or Fever >/= 101).  albuterol 108 (90 Base) MCG/ACT inhaler Commonly known as: VENTOLIN HFA Inhale 2 puffs into the lungs every 6 (six) hours as needed for wheezing or shortness of breath.  amLODipine 10 MG tablet Commonly known as: NORVASC Take 10 mg by mouth daily.  amLODipine 5 MG tablet Commonly known as: NORVASC Take 1 tablet (5 mg total) by mouth daily. For BP  aspirin EC 81 MG tablet Take 1 tablet (81 mg total) by mouth daily with breakfast.  atorvastatin 40 MG tablet Commonly known as: LIPITOR Take 1 tablet (40 mg total) by mouth daily.  famotidine 20 MG tablet Commonly known as: Pepcid One after supper  HYDROcodone-acetaminophen 5-325 MG tablet Commonly known as: NORCO/VICODIN Take 1 tablet by mouth daily as needed for moderate pain.  ipratropium 0.03 % nasal spray Commonly known as: ATROVENT Place 2 sprays into both nostrils as needed for rhinitis.  ipratropium-albuterol 0.5-2.5 (3) MG/3ML Soln Commonly known as: DUONEB Take 3 mLs by nebulization every 4 (four) hours as needed.  isosorbide mononitrate 60 MG 24 hr tablet Commonly known as: IMDUR Take 2 tablets (120 mg total) by mouth every evening.  losartan 25 MG tablet Commonly known as: COZAAR Take 1 tablet (25 mg total) by mouth daily.  metFORMIN 1000 MG tablet Commonly known as:  GLUCOPHAGE Take 500 mg by mouth 2 (two) times daily with a meal.  metoprolol succinate 25 MG 24 hr tablet Commonly known as: TOPROL-XL Take 1 tablet (25 mg total) by mouth daily. Pt. Needs to make an appt. With Cardiologist in order to receive further refills. Thank You. 1st Attempt.  nitroGLYCERIN 0.4 MG SL tablet Commonly known as: NITROSTAT Place 1 tablet (0.4 mg total) under the tongue every 5 (five) minutes x 3 doses as needed for chest pain.  OXYGEN Inhale 2 L into the lungs at bedtime.  pantoprazole 40 MG tablet Commonly known as: PROTONIX Take 1 tablet (40 mg total) by mouth daily. Take 30-60 min before first meal of the day  potassium chloride 10 MEQ tablet Commonly known as: KLOR-CON M Take 1 tablet (10 mEq total) by mouth daily. Only take while taking Torsemide  torsemide 10 MG tablet Commonly known as: DEMADEX Take 1 tablet (10 mg total) by mouth every other day. Take 20 mg for weight gain over 3 lbs in 24 hours  warfarin 1 MG tablet Commonly known as: COUMADIN Take 1 mg by mouth every evening. MANAGED BY PMD      Allergies:  Allergies Allergen Reactions  Niacin Itching and Rash   Burning sensation  Contrast Media [Iodinated Contrast Media] Nausea And Vomiting  Iodine-131 Nausea And Vomiting  Nsaids Other (See Comments)   Taking Coumadin   Family History: Family History  Problem Relation Age of Onset  Deep vein thrombosis Father   Lung cancer Sister   Diabetes Sister   Heart disease Sister       After age 38  Hyperlipidemia Sister   Hypertension Sister   Lung cancer Sister   Breast cancer Sister   Hypertension Mother   Diabetes Sister   Coronary artery disease Other       family hx of  Cancer Brother       "crab cancer"  Heart disease Brother   Heart attack Brother   Heart attack Daughter   Colon cancer Neg Hx   Stroke Neg Hx    Social History:  reports that he quit smoking about 3 years ago. His smoking use included cigarettes. He has  a 140.00 pack-year smoking history. He has never used smokeless tobacco. He reports current alcohol use. He reports that he does not use drugs.  ROS: All other review of systems were reviewed and are negative except what is noted above in HPI  Physical Exam: BP (!) 151/72   Pulse (!) 59   Constitutional:  Alert and oriented, No acute distress. HEENT: [place] AT, moist mucus membranes.  Trachea midline, no masses. Cardiovascular: No clubbing, cyanosis, or edema. Respiratory: Normal respiratory effort, no increased work of breathing. GI: Abdomen is soft, nontender, nondistended, no abdominal masses GU: No CVA tenderness.  Lymph: No cervical or inguinal lymphadenopathy. Skin: No rashes, bruises or suspicious lesions. Neurologic: Grossly intact, no focal deficits, moving all 4 extremities. Psychiatric: Normal mood and affect.  Laboratory Data: Lab Results Component Value Date  WBC 8.4 08/23/2021  HGB 13.4 08/23/2021  HCT 41.7 08/23/2021  MCV 91 08/23/2021  PLT 316 04/11/2020   Lab Results Component Value Date  CREATININE 1.12 09/06/2021   No results found for: "PSA"  No results found for: "TESTOSTERONE"  Lab Results Component Value Date  HGBA1C 6.5 (H) 08/29/2021   Urinalysis   Component Value Date/Time  APPEARANCEUR Clear 09/08/2021 1138  GLUCOSEU Negative 09/08/2021 1138  BILIRUBINUR Negative 09/08/2021 1138  PROTEINUR 1+ (A) 09/08/2021 1138  NITRITE Negative 09/08/2021 1138  LEUKOCYTESUR Negative 09/08/2021 1138   Lab Results Component Value Date  LABMICR See below: 09/08/2021  WBCUA None seen 09/08/2021  LABEPIT 0-10 09/08/2021  MUCUS Present 09/08/2021  BACTERIA Few 09/08/2021   Pertinent Imaging:  No results found for this or any previous visit.  No results found for this or any previous visit.  No results found for this or any previous visit.  No results found for this or any previous visit.  No results found for this or any previous visit.  No  results found for this or any previous visit.  Results for orders placed during the hospital encounter of 08/18/21  CT HEMATURIA WORKUP  Narrative CLINICAL DATA:  Gross hematuria since early May, history of prostate cancer * Tracking Code: BO *  EXAM: CT ABDOMEN AND PELVIS WITHOUT AND WITH CONTRAST  TECHNIQUE: Multidetector CT imaging of the abdomen and pelvis was performed following the standard protocol before and following the bolus administration of intravenous contrast.  RADIATION DOSE REDUCTION: This exam was performed according to the departmental dose-optimization program which includes automated exposure control, adjustment of the mA and/or kV according to patient size and/or use of iterative reconstruction technique.  CONTRAST:  150m OMNIPAQUE IOHEXOL 300 MG/ML  SOLN  COMPARISON:  None Available.  FINDINGS: Lower chest: No acute abnormality. Cardiomegaly. Coronary artery calcifications.  Hepatobiliary: No solid liver abnormality is seen. Small, definitively  benign cyst or hemangioma of the central liver dome for which no further follow-up or characterization is required (series 15, image 30). No gallstones, gallbladder wall thickening, or biliary dilatation.  Pancreas: Unremarkable. No pancreatic ductal dilatation or surrounding inflammatory changes.  Spleen: Normal in size without significant abnormality.  Adrenals/Urinary Tract: Adrenal glands are unremarkable. Small simple, benign bilateral renal cortical cysts, as well as intrinsically hyperdense, nonenhancing hemorrhagic or proteinaceous cysts of the midportions of the left and right kidneys (series 2, image 37). No urinary tract filling defect on delayed phase imaging. Bladder is unremarkable.  Stomach/Bowel: Stomach is within normal limits. Appendix appears normal. No evidence of bowel wall thickening, distention, or inflammatory changes. Descending and sigmoid  diverticulosis.  Vascular/Lymphatic: Aortic atherosclerosis. 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 No enlarged abdominal or pelvic lymph nodes.  Reproductive: Prostate brachytherapy.  Other: Small, fat containing bilateral inguinal hernias. No ascites.  Musculoskeletal: No acute or significant osseous findings.  IMPRESSION: 1. No evidence of urinary tract mass, calculus, or hydronephrosis. No urinary tract filling defect on delayed phase imaging. 2. Prostate brachytherapy. No evidence of mass or lymphadenopathy in the abdomen or pelvis. 3. Small simple, benign bilateral renal cortical cysts, as well as nonenhancing, benign hemorrhagic or proteinaceous cysts. No further follow-up or characterization is required for these benign renal cysts. 4. 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 Recommend referral to a vascular specialist. This recommendation follows ACR consensus guidelines: White Paper of the ACR Incidental Findings Committee II on Vascular Findings. J Am Coll Radiol 2013; 10:789-794. 5. Cardiomegaly and coronary artery disease.  Aortic Atherosclerosis (ICD10-I70.0).   Electronically Signed By: Delanna Ahmadi M.D. On: 08/19/2021 11:29  No results found for this or any previous visit.   Assessment & Plan:    1. Urinary incontinence, unspecified type -mirabegron '25mg'$  daily - Urinalysis, Routine w reflex microscopic - BLADDER SCAN  AMB NON-IMAGING  2. Positive urine cytology -FISH today, will call with results  No follow-ups on file.  Nicolette Bang, MD  [facility] [place]

## 2021-10-12 ENCOUNTER — Telehealth: Payer: Self-pay | Admitting: Internal Medicine

## 2021-10-12 DIAGNOSIS — G4734 Idiopathic sleep related nonobstructive alveolar hypoventilation: Secondary | ICD-10-CM

## 2021-10-12 NOTE — Telephone Encounter (Signed)
Order has been placed for the DME to be changed to [employer] due to having [employer]. Nothing further needed.

## 2021-10-13 NOTE — Telephone Encounter (Signed)
Called pt back to discuss questions, he states his questions were already answered.  Nothing further needed.

## 2021-10-18 DIAGNOSIS — R9431 Abnormal electrocardiogram [ECG] [EKG]: Secondary | ICD-10-CM | POA: Diagnosis not present

## 2021-10-18 DIAGNOSIS — Z888 Allergy status to other drugs, medicaments and biological substances status: Secondary | ICD-10-CM | POA: Diagnosis not present

## 2021-10-18 DIAGNOSIS — E860 Dehydration: Secondary | ICD-10-CM | POA: Diagnosis not present

## 2021-10-18 DIAGNOSIS — J449 Chronic obstructive pulmonary disease, unspecified: Secondary | ICD-10-CM | POA: Diagnosis not present

## 2021-10-18 DIAGNOSIS — Z951 Presence of aortocoronary bypass graft: Secondary | ICD-10-CM | POA: Diagnosis not present

## 2021-10-18 DIAGNOSIS — Z8546 Personal history of malignant neoplasm of prostate: Secondary | ICD-10-CM | POA: Diagnosis not present

## 2021-10-18 DIAGNOSIS — Z91041 Radiographic dye allergy status: Secondary | ICD-10-CM | POA: Diagnosis not present

## 2021-10-18 DIAGNOSIS — R531 Weakness: Secondary | ICD-10-CM | POA: Diagnosis not present

## 2021-10-18 DIAGNOSIS — Z87891 Personal history of nicotine dependence: Secondary | ICD-10-CM | POA: Diagnosis not present

## 2021-10-18 DIAGNOSIS — I739 Peripheral vascular disease, unspecified: Secondary | ICD-10-CM | POA: Diagnosis not present

## 2021-10-18 DIAGNOSIS — R9082 White matter disease, unspecified: Secondary | ICD-10-CM | POA: Diagnosis not present

## 2021-10-18 DIAGNOSIS — I4510 Unspecified right bundle-branch block: Secondary | ICD-10-CM | POA: Diagnosis not present

## 2021-10-18 DIAGNOSIS — R42 Dizziness and giddiness: Secondary | ICD-10-CM | POA: Diagnosis not present

## 2021-10-18 DIAGNOSIS — I517 Cardiomegaly: Secondary | ICD-10-CM | POA: Diagnosis not present

## 2021-10-25 ENCOUNTER — Other Ambulatory Visit (HOSPITAL_COMMUNITY): Payer: Self-pay | Admitting: Specialist

## 2021-10-25 DIAGNOSIS — E860 Dehydration: Secondary | ICD-10-CM | POA: Diagnosis not present

## 2021-10-25 DIAGNOSIS — R1312 Dysphagia, oropharyngeal phase: Secondary | ICD-10-CM

## 2021-10-25 DIAGNOSIS — R310 Gross hematuria: Secondary | ICD-10-CM | POA: Diagnosis not present

## 2021-10-27 ENCOUNTER — Ambulatory Visit (INDEPENDENT_AMBULATORY_CARE_PROVIDER_SITE_OTHER): Payer: No Typology Code available for payment source | Admitting: Urology

## 2021-10-27 ENCOUNTER — Encounter: Payer: Self-pay | Admitting: Urology

## 2021-10-27 VITALS — BP 129/57 | HR 68 | Ht 70.5 in | Wt 200.0 lb

## 2021-10-27 DIAGNOSIS — R339 Retention of urine, unspecified: Secondary | ICD-10-CM | POA: Diagnosis not present

## 2021-10-27 DIAGNOSIS — R310 Gross hematuria: Secondary | ICD-10-CM | POA: Diagnosis not present

## 2021-10-27 LAB — MICROSCOPIC EXAMINATION
Renal Epithel, UA: NONE SEEN /hpf
WBC, UA: >30 /hpf — AB (ref 0–5)

## 2021-10-27 LAB — URINALYSIS, ROUTINE W REFLEX MICROSCOPIC
Bilirubin, UA: NEGATIVE
Glucose, UA: NEGATIVE
Ketones, UA: NEGATIVE
Nitrite, UA: NEGATIVE
Specific Gravity, UA: 1.015 (ref 1.005–1.030)
Urobilinogen, Ur: 1 mg/dL (ref 0.2–1.0)
pH, UA: 8.5 — ABNORMAL HIGH (ref 5.0–7.5)

## 2021-10-27 LAB — BLADDER SCAN AMB NON-IMAGING: Scan Result: 4

## 2021-10-27 MED ORDER — CEFUROXIME AXETIL 500 MG PO TABS
500.0000 mg | ORAL_TABLET | Freq: Two times a day (BID) | ORAL | 0 refills | Status: DC
Start: 2021-10-27 — End: 2021-11-06

## 2021-10-27 MED ORDER — TAMSULOSIN HCL 0.4 MG PO CAPS: 0.4000 mg | ORAL_CAPSULE | Freq: Every day | ORAL | 11 refills | Status: DC

## 2021-10-27 NOTE — Patient Instructions (Signed)
Urinary Tract Infection, Adult A urinary tract infection (UTI) is an infection of any part of the urinary tract. The urinary tract includes: The kidneys. The ureters. The bladder. The urethra. These organs make, store, and get rid of pee (urine) in the body. What are the causes? This infection is caused by germs (bacteria) in your genital area. These germs grow and cause swelling (inflammation) of your urinary tract. What increases the risk? The following factors may make you more likely to develop this condition: Using a small, thin tube (catheter) to drain pee. Not being able to control when you pee or poop (incontinence). Being male. If you are male, these things can increase the risk: Using these methods to prevent pregnancy: A medicine that kills sperm (spermicide). A device that blocks sperm (diaphragm). Having low levels of a male hormone (estrogen). Being pregnant. You are more likely to develop this condition if: You have genes that add to your risk. You are sexually active. You take antibiotic medicines. You have trouble peeing because of: A prostate that is bigger than normal, if you are male. A blockage in the part of your body that drains pee from the bladder. A kidney stone. A nerve condition that affects your bladder. Not getting enough to drink. Not peeing often enough. You have other conditions, such as: Diabetes. A weak disease-fighting system (immune system). Sickle cell disease. Gout. Injury of the spine. What are the signs or symptoms? Symptoms of this condition include: Needing to pee right away. Peeing small amounts often. Pain or burning when peeing. Blood in the pee. Pee that smells bad or not like normal. Trouble peeing. Pee that is cloudy. Fluid coming from the vagina, if you are male. Pain in the belly or lower back. Other symptoms include: Vomiting. Not feeling hungry. Feeling mixed up (confused). This may be the first symptom in  older adults. Being tired and grouchy (irritable). A fever. Watery poop (diarrhea). How is this treated? Taking antibiotic medicine. Taking other medicines. Drinking enough water. In some cases, you may need to see a specialist. Follow these instructions at home:  Medicines Take over-the-counter and prescription medicines only as told by your doctor. If you were prescribed an antibiotic medicine, take it as told by your doctor. Do not stop taking it even if you start to feel better. General instructions Make sure you: Pee until your bladder is empty. Do not hold pee for a long time. Empty your bladder after sex. Wipe from front to back after peeing or pooping if you are a male. Use each tissue one time when you wipe. Drink enough fluid to keep your pee pale yellow. Keep all follow-up visits. Contact a doctor if: You do not get better after 1-2 days. Your symptoms go away and then come back. Get help right away if: You have very bad back pain. You have very bad pain in your lower belly. You have a fever. You have chills. You feeling like you will vomit or you vomit. Summary A urinary tract infection (UTI) is an infection of any part of the urinary tract. This condition is caused by germs in your genital area. There are many risk factors for a UTI. Treatment includes antibiotic medicines. Drink enough fluid to keep your pee pale yellow. This information is not intended to replace advice given to you by your health care provider. Make sure you discuss any questions you have with your health care provider. Document Revised: 10/30/2019 Document Reviewed: 10/30/2019 Elsevier Patient Education    2023 [employer]  

## 2021-10-27 NOTE — Progress Notes (Signed)
post void residual =54m

## 2021-10-27 NOTE — Progress Notes (Signed)
10/27/2021 1:08 PM   CHRISTY FRIEDE 12-21-1940 [id number]  Referring provider: Sharilyn Sites, [place],  [place] [id number]  Dysuria and hematuria   HPI: Mr Peerson is a 80yo here for evaluation of dysuria and urinary frequency. Last week he was admitted at the [facility] in [place] for dizziness and dehydration. His dysuria has improved over the past several weeks. He denies nay recent gross hematuria. He denies any recent UTI. No other complaints today   PMH: Past Medical History:  Diagnosis Date   AAA (abdominal aortic aneurysm) ([place])    Followed by Dr. Sherren Mocha Early   Arthritis    CAD (coronary artery disease)    a. CABG 2000.   Cancer [facility])    Prostate:  Radiation Tx   Carotid artery disease ([place])    Chest pain    precordial. mild chronic .[name]... nonischemic   CHF (congestive heart failure) (HCC)    Chronic edema    Coronary artery disease    a. Nuclear, January, 2008, no ischemia b. Cath 08/2012- 1/4 patent grafts, RCA CTO, no flow-limiting disease, medically managed   Diabetes mellitus without complication ([place])    Dizziness 02/2011   Dyslipidemia    Fall    GERD (gastroesophageal reflux disease)    TAKES TUMS & ROLAIDS AS NEEDED   Hx of CABG 2000   Hypertension    Myocardial infarction ([place]) 1999   Neck pain 02/2011   Pneumonia    Pulmonary hypertension ([place])    Renal artery stenosis (HCC)    50-70%   S/P femoropopliteal bypass surgery    Dr. Donnetta Hutching   Sinus bradycardia    Asymptomatic    Surgical History: Past Surgical History:  Procedure Laterality Date   CARDIAC CATHETERIZATION  09/26/2012   1/4 patent bypass (occluded SVG-PDA, SVG-OM, LIMA-LAD), SVG-diagonal patent and fills the diagonal and LAD, distal RCA occlusion with left to right collateralization, patent circumflex, LAD with no flow-limiting disease and antegrade flow competitively from SVG-diagonal; EF 60-65%   COLONOSCOPY  11/30/2009   AOZ:[id number]. next TCS 11/2019    COLONOSCOPY WITH PROPOFOL N/A 12/18/2019   Procedure: COLONOSCOPY WITH PROPOFOL;  Surgeon: Harvel Quale, MD;  [facility];  Service: Gastroenterology;  Laterality: N/A;  1030   CORONARY ARTERY BYPASS GRAFT  2000   CYSTOSCOPY N/A 08/31/2021   Procedure: CYSTOSCOPY;  Surgeon: Cleon Gustin, MD;  Location: [facility] ORS;  Service: Urology;  Laterality: N/A;  pt knows to arrive at 7:00   ESOPHAGOGASTRODUODENOSCOPY N/A 08/12/2013   MWU:[id number] peptic stricture with erosive refluxesophagitis - status post Maloney dilation. Hiatal hernia. Abnormalgastric mucosa. Deformity of the pyloric channel suggestive ofprior peptic ulcer disease. Duodenal bulbar diverticulum Statuspost gastric biopsy. h.pylori   LEFT HEART CATHETERIZATION WITH CORONARY/GRAFT ANGIOGRAM N/A 09/26/2012   Procedure: LEFT HEART CATHETERIZATION WITH Beatrix Fetters;  Surgeon: Burnell Blanks, MD;  Location: [facility] CATH LAB;  Service: Cardiovascular;  Laterality: N/A;   MALONEY DILATION N/A 08/12/2013   Procedure: Venia Minks DILATION;  Surgeon: Daneil Dolin, MD;  Location: [facility];  Service: Endoscopy;  Laterality: N/A;   POLYPECTOMY  12/18/2019   Procedure: POLYPECTOMY;  Surgeon: Harvel Quale, MD;  [facility];  Service: Gastroenterology;;  ascending colon polyp    PR VEIN BYPASS GRAFT,AORTO-FEM-POP Right 07/19/1999   PR VEIN BYPASS GRAFT,AORTO-FEM-POP Left 05/02/2006   RIGHT HEART CATH AND CORONARY/GRAFT ANGIOGRAPHY N/A 04/11/2020   Procedure: RIGHT HEART CATH AND CORONARY/GRAFT ANGIOGRAPHY;  Surgeon: Larey Dresser,  MD;  Location: [place] CV LAB;  Service: Cardiovascular;  Laterality: N/A;   SAVORY DILATION N/A 08/12/2013   Procedure: SAVORY DILATION;  Surgeon: Daneil Dolin, MD;  [facility];  Service: Endoscopy;  Laterality: N/A;   TRANSURETHRAL RESECTION OF BLADDER TUMOR N/A 08/31/2021   Procedure: TRANSURETHRAL RESECTION OF BLADDER TUMOR (TURBT);   Surgeon: Cleon Gustin, MD;  Location: [facility];  Service: Urology;  Laterality: N/A;   WRIST SURGERY     cyst removal    Home Medications:  Allergies as of 10/27/2021       Reactions   Niacin Itching, Rash   Burning sensation   Contrast Media [iodinated Contrast Media] Nausea And Vomiting   Iodine-131 Nausea And Vomiting   Nsaids Other (See Comments)   Taking Coumadin        Medication List        Accurate as of October 27, 2021  1:08 PM. If you have any questions, ask your nurse or doctor.          acetaminophen 325 MG tablet Commonly known as: TYLENOL Take 2 tablets (650 mg total) by mouth every 6 (six) hours as needed for mild pain, fever or headache (or Fever >/= 101).   albuterol 108 (90 Base) MCG/ACT inhaler Commonly known as: VENTOLIN HFA Inhale 2 puffs into the lungs every 6 (six) hours as needed for wheezing or shortness of breath.   amLODipine 10 MG tablet Commonly known as: NORVASC Take 10 mg by mouth daily.   amLODipine 5 MG tablet Commonly known as: NORVASC Take 1 tablet (5 mg total) by mouth daily. For BP   aspirin EC 81 MG tablet Take 1 tablet (81 mg total) by mouth daily with breakfast.   atorvastatin 40 MG tablet Commonly known as: LIPITOR Take 1 tablet (40 mg total) by mouth daily.   famotidine 20 MG tablet Commonly known as: Pepcid One after supper   HYDROcodone-acetaminophen 5-325 MG tablet Commonly known as: NORCO/VICODIN Take 1 tablet by mouth daily as needed for moderate pain.   ipratropium 0.03 % nasal spray Commonly known as: ATROVENT Place 2 sprays into both nostrils as needed for rhinitis.   ipratropium-albuterol 0.5-2.5 (3) MG/3ML Soln Commonly known as: DUONEB Take 3 mLs by nebulization every 4 (four) hours as needed.   isosorbide mononitrate 60 MG 24 hr tablet Commonly known as: IMDUR Take 2 tablets (120 mg total) by mouth every evening.   losartan 25 MG tablet Commonly known as: COZAAR Take 1 tablet (25 mg  total) by mouth daily.   metFORMIN 1000 MG tablet Commonly known as: GLUCOPHAGE Take 500 mg by mouth 2 (two) times daily with a meal.   metoprolol succinate 25 MG 24 hr tablet Commonly known as: TOPROL-XL Take 1 tablet (25 mg total) by mouth daily. Pt. Needs to make an appt. With Cardiologist in order to receive further refills. Thank You. 1st Attempt.   mirabegron ER 25 MG Tb24 tablet Commonly known as: MYRBETRIQ Take 1 tablet (25 mg total) by mouth daily.   nitroGLYCERIN 0.4 MG SL tablet Commonly known as: NITROSTAT Place 1 tablet (0.4 mg total) under the tongue every 5 (five) minutes x 3 doses as needed for chest pain.   OXYGEN Inhale 2 L into the lungs at bedtime.   pantoprazole 40 MG tablet Commonly known as: PROTONIX Take 1 tablet (40 mg total) by mouth daily. Take 30-60 min before first meal of the day   potassium chloride 10 MEQ tablet  Commonly known as: KLOR-CON M Take 1 tablet (10 mEq total) by mouth daily. Only take while taking Torsemide   torsemide 10 MG tablet Commonly known as: DEMADEX Take 1 tablet (10 mg total) by mouth every other day. Take 20 mg for weight gain over 3 lbs in 24 hours   warfarin 1 MG tablet Commonly known as: COUMADIN Take 1 mg by mouth every evening. MANAGED BY PMD        Allergies:  Allergies  Allergen Reactions   Niacin Itching and Rash    Burning sensation   Contrast Media [Iodinated Contrast Media] Nausea And Vomiting   Iodine-131 Nausea And Vomiting   Nsaids Other (See Comments)    Taking Coumadin    Family History: Family History  Problem Relation Age of Onset   Deep vein thrombosis Father    Lung cancer Sister    Diabetes Sister    Heart disease Sister        After age 33   Hyperlipidemia Sister    Hypertension Sister    Lung cancer Sister    Breast cancer Sister    Hypertension Mother    Diabetes Sister    Coronary artery disease Other        family hx of   Cancer Brother        "crab cancer"   Heart  disease Brother    Heart attack Brother    Heart attack Daughter    Colon cancer Neg Hx    Stroke Neg Hx     Social History:  reports that he quit smoking about 3 years ago. His smoking use included cigarettes. He has a 140.00 pack-year smoking history. He has never used smokeless tobacco. He reports current alcohol use. He reports that he does not use drugs.  ROS: All other review of systems were reviewed and are negative except what is noted above in HPI  Physical Exam: BP (!) 129/57   Pulse 68   Ht 5' 10.5" (1.791 m)   Wt 200 lb (90.7 kg)   BMI 28.29 kg/m   Constitutional:  Alert and oriented, No acute distress. HEENT: [place] AT, moist mucus membranes.  Trachea midline, no masses. Cardiovascular: No clubbing, cyanosis, or edema. Respiratory: Normal respiratory effort, no increased work of breathing. GI: Abdomen is soft, nontender, nondistended, no abdominal masses GU: No CVA tenderness.  Lymph: No cervical or inguinal lymphadenopathy. Skin: No rashes, bruises or suspicious lesions. Neurologic: Grossly intact, no focal deficits, moving all 4 extremities. Psychiatric: Normal mood and affect.  Laboratory Data: Lab Results  Component Value Date   WBC 8.4 08/23/2021   HGB 13.4 08/23/2021   HCT 41.7 08/23/2021   MCV 91 08/23/2021   PLT 316 04/11/2020    Lab Results  Component Value Date   CREATININE 1.12 09/06/2021    No results found for: "PSA"  No results found for: "TESTOSTERONE"  Lab Results  Component Value Date   HGBA1C 6.5 (H) 08/29/2021    Urinalysis    Component Value Date/Time   APPEARANCEUR Clear 10/04/2021 0946   GLUCOSEU Negative 10/04/2021 0946   BILIRUBINUR Negative 10/04/2021 0946   PROTEINUR 2+ (A) 10/04/2021 0946   NITRITE Negative 10/04/2021 0946   LEUKOCYTESUR 1+ (A) 10/04/2021 0946    Lab Results  Component Value Date   LABMICR Comment 10/04/2021   WBCUA None seen 09/08/2021   LABEPIT 0-10 09/08/2021   MUCUS Present 09/08/2021    BACTERIA Few 09/08/2021    Pertinent Imaging:  No  results found for this or any previous visit.  No results found for this or any previous visit.  No results found for this or any previous visit.  No results found for this or any previous visit.  No results found for this or any previous visit.  No results found for this or any previous visit.  Results for orders placed during the hospital encounter of 08/18/21  CT HEMATURIA WORKUP  Narrative CLINICAL DATA:  Gross hematuria since early May, history of prostate cancer * Tracking Code: BO *  EXAM: CT ABDOMEN AND PELVIS WITHOUT AND WITH CONTRAST  TECHNIQUE: Multidetector CT imaging of the abdomen and pelvis was performed following the standard protocol before and following the bolus administration of intravenous contrast.  RADIATION DOSE REDUCTION: This exam was performed according to the departmental dose-optimization program which includes automated exposure control, adjustment of the mA and/or kV according to patient size and/or use of iterative reconstruction technique.  CONTRAST:  168m OMNIPAQUE IOHEXOL 300 MG/ML  SOLN  COMPARISON:  None Available.  FINDINGS: Lower chest: No acute abnormality. Cardiomegaly. Coronary artery calcifications.  Hepatobiliary: No solid liver abnormality is seen. Small, definitively benign cyst or hemangioma of the central liver dome for which no further follow-up or characterization is required (series 15, image 30). No gallstones, gallbladder wall thickening, or biliary dilatation.  Pancreas: Unremarkable. No pancreatic ductal dilatation or surrounding inflammatory changes.  Spleen: Normal in size without significant abnormality.  Adrenals/Urinary Tract: Adrenal glands are unremarkable. Small simple, benign bilateral renal cortical cysts, as well as intrinsically hyperdense, nonenhancing hemorrhagic or proteinaceous cysts of the midportions of the left and right kidneys  (series 2, image 37). No urinary tract filling defect on delayed phase imaging. Bladder is unremarkable.  Stomach/Bowel: Stomach is within normal limits. Appendix appears normal. No evidence of bowel wall thickening, distention, or inflammatory changes. Descending and sigmoid diverticulosis.  Vascular/Lymphatic: Aortic atherosclerosis. 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 No enlarged abdominal or pelvic lymph nodes.  Reproductive: Prostate brachytherapy.  Other: Small, fat containing bilateral inguinal hernias. No ascites.  Musculoskeletal: No acute or significant osseous findings.  IMPRESSION: 1. No evidence of urinary tract mass, calculus, or hydronephrosis. No urinary tract filling defect on delayed phase imaging. 2. Prostate brachytherapy. No evidence of mass or lymphadenopathy in the abdomen or pelvis. 3. Small simple, benign bilateral renal cortical cysts, as well as nonenhancing, benign hemorrhagic or proteinaceous cysts. No further follow-up or characterization is required for these benign renal cysts. 4. 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 Recommend referral to a vascular specialist. This recommendation follows ACR consensus guidelines: White Paper of the ACR Incidental Findings Committee II on Vascular Findings. J Am Coll Radiol 2013; 10:789-794. 5. Cardiomegaly and coronary artery disease.  Aortic Atherosclerosis  (ICD10-I70.0).   Electronically Signed By: ADelanna AhmadiM.D. On: 08/19/2021 11:29  No results found for this or any previous visit.   Assessment & Plan:    1. Urinary retention -resolved - BLADDER SCAN AMB NON-IMAGING -  Urinalysis, Routine w reflex microscopic  2. Gross hematuria -resolved   No follow-ups on file.  Nicolette Bang, MD  [facility] [place]

## 2021-10-29 LAB — URINE CULTURE

## 2021-10-30 DIAGNOSIS — E1122 Type 2 diabetes mellitus with diabetic chronic kidney disease: Secondary | ICD-10-CM | POA: Diagnosis not present

## 2021-10-30 DIAGNOSIS — I129 Hypertensive chronic kidney disease with stage 1 through stage 4 chronic kidney disease, or unspecified chronic kidney disease: Secondary | ICD-10-CM | POA: Diagnosis not present

## 2021-10-30 DIAGNOSIS — N182 Chronic kidney disease, stage 2 (mild): Secondary | ICD-10-CM | POA: Diagnosis not present

## 2021-11-06 ENCOUNTER — Ambulatory Visit (HOSPITAL_COMMUNITY): Payer: No Typology Code available for payment source | Admitting: Speech Pathology

## 2021-11-06 ENCOUNTER — Inpatient Hospital Stay (HOSPITAL_COMMUNITY)
Admission: EM | Admit: 2021-11-06 | Discharge: 2021-11-09 | DRG: 291 | Disposition: A | Discharge status: Home Health | Payer: No Typology Code available for payment source | Attending: Internal Medicine | Admitting: Internal Medicine

## 2021-11-06 ENCOUNTER — Emergency Department (HOSPITAL_COMMUNITY): Payer: No Typology Code available for payment source

## 2021-11-06 ENCOUNTER — Other Ambulatory Visit: Payer: Self-pay

## 2021-11-06 ENCOUNTER — Encounter (HOSPITAL_COMMUNITY): Payer: Self-pay | Admitting: *Deleted

## 2021-11-06 DIAGNOSIS — I272 Pulmonary hypertension, unspecified: Secondary | ICD-10-CM | POA: Diagnosis present

## 2021-11-06 DIAGNOSIS — R1314 Dysphagia, pharyngoesophageal phase: Secondary | ICD-10-CM | POA: Diagnosis present

## 2021-11-06 DIAGNOSIS — D6869 Other thrombophilia: Secondary | ICD-10-CM

## 2021-11-06 DIAGNOSIS — I5033 Acute on chronic diastolic (congestive) heart failure: Secondary | ICD-10-CM | POA: Diagnosis present

## 2021-11-06 DIAGNOSIS — I10 Essential (primary) hypertension: Secondary | ICD-10-CM

## 2021-11-06 DIAGNOSIS — I5031 Acute diastolic (congestive) heart failure: Secondary | ICD-10-CM

## 2021-11-06 DIAGNOSIS — Z87891 Personal history of nicotine dependence: Secondary | ICD-10-CM | POA: Diagnosis not present

## 2021-11-06 DIAGNOSIS — Z79899 Other long term (current) drug therapy: Secondary | ICD-10-CM

## 2021-11-06 DIAGNOSIS — Z20822 Contact with and (suspected) exposure to covid-19: Secondary | ICD-10-CM | POA: Diagnosis present

## 2021-11-06 DIAGNOSIS — Z801 Family history of malignant neoplasm of trachea, bronchus and lung: Secondary | ICD-10-CM

## 2021-11-06 DIAGNOSIS — I25810 Atherosclerosis of coronary artery bypass graft(s) without angina pectoris: Secondary | ICD-10-CM

## 2021-11-06 DIAGNOSIS — I739 Peripheral vascular disease, unspecified: Secondary | ICD-10-CM | POA: Diagnosis not present

## 2021-11-06 DIAGNOSIS — R1319 Other dysphagia: Secondary | ICD-10-CM | POA: Diagnosis not present

## 2021-11-06 DIAGNOSIS — E785 Hyperlipidemia, unspecified: Secondary | ICD-10-CM | POA: Diagnosis present

## 2021-11-06 DIAGNOSIS — D6859 Other primary thrombophilia: Secondary | ICD-10-CM | POA: Diagnosis present

## 2021-11-06 DIAGNOSIS — Z7984 Long term (current) use of oral hypoglycemic drugs: Secondary | ICD-10-CM | POA: Diagnosis not present

## 2021-11-06 DIAGNOSIS — I4891 Unspecified atrial fibrillation: Secondary | ICD-10-CM | POA: Diagnosis present

## 2021-11-06 DIAGNOSIS — Y832 Surgical operation with anastomosis, bypass or graft as the cause of abnormal reaction of the patient, or of later complication, without mention of misadventure at the time of the procedure: Secondary | ICD-10-CM | POA: Diagnosis present

## 2021-11-06 DIAGNOSIS — I701 Atherosclerosis of renal artery: Secondary | ICD-10-CM | POA: Diagnosis present

## 2021-11-06 DIAGNOSIS — I2510 Atherosclerotic heart disease of native coronary artery without angina pectoris: Secondary | ICD-10-CM | POA: Diagnosis present

## 2021-11-06 DIAGNOSIS — R0602 Shortness of breath: Secondary | ICD-10-CM | POA: Diagnosis not present

## 2021-11-06 DIAGNOSIS — I509 Heart failure, unspecified: Secondary | ICD-10-CM | POA: Diagnosis not present

## 2021-11-06 DIAGNOSIS — Z833 Family history of diabetes mellitus: Secondary | ICD-10-CM

## 2021-11-06 DIAGNOSIS — G4734 Idiopathic sleep related nonobstructive alveolar hypoventilation: Secondary | ICD-10-CM

## 2021-11-06 DIAGNOSIS — Z7901 Long term (current) use of anticoagulants: Secondary | ICD-10-CM | POA: Diagnosis not present

## 2021-11-06 DIAGNOSIS — I4821 Permanent atrial fibrillation: Secondary | ICD-10-CM | POA: Diagnosis present

## 2021-11-06 DIAGNOSIS — I252 Old myocardial infarction: Secondary | ICD-10-CM

## 2021-11-06 DIAGNOSIS — Z8249 Family history of ischemic heart disease and other diseases of the circulatory system: Secondary | ICD-10-CM

## 2021-11-06 DIAGNOSIS — Z7982 Long term (current) use of aspirin: Secondary | ICD-10-CM

## 2021-11-06 DIAGNOSIS — R32 Unspecified urinary incontinence: Secondary | ICD-10-CM | POA: Diagnosis present

## 2021-11-06 DIAGNOSIS — E119 Type 2 diabetes mellitus without complications: Secondary | ICD-10-CM

## 2021-11-06 DIAGNOSIS — J9621 Acute and chronic respiratory failure with hypoxia: Secondary | ICD-10-CM | POA: Diagnosis present

## 2021-11-06 DIAGNOSIS — Z83438 Family history of other disorder of lipoprotein metabolism and other lipidemia: Secondary | ICD-10-CM | POA: Diagnosis not present

## 2021-11-06 DIAGNOSIS — Z9981 Dependence on supplemental oxygen: Secondary | ICD-10-CM

## 2021-11-06 DIAGNOSIS — J9811 Atelectasis: Secondary | ICD-10-CM | POA: Diagnosis not present

## 2021-11-06 DIAGNOSIS — Z95828 Presence of other vascular implants and grafts: Secondary | ICD-10-CM

## 2021-11-06 DIAGNOSIS — Z923 Personal history of irradiation: Secondary | ICD-10-CM | POA: Diagnosis not present

## 2021-11-06 DIAGNOSIS — E1151 Type 2 diabetes mellitus with diabetic peripheral angiopathy without gangrene: Secondary | ICD-10-CM | POA: Diagnosis present

## 2021-11-06 DIAGNOSIS — I110 Hypertensive heart disease with heart failure: Secondary | ICD-10-CM | POA: Diagnosis present

## 2021-11-06 DIAGNOSIS — Z803 Family history of malignant neoplasm of breast: Secondary | ICD-10-CM

## 2021-11-06 DIAGNOSIS — R059 Cough, unspecified: Secondary | ICD-10-CM | POA: Diagnosis not present

## 2021-11-06 DIAGNOSIS — T82898A Other specified complication of vascular prosthetic devices, implants and grafts, initial encounter: Secondary | ICD-10-CM | POA: Diagnosis present

## 2021-11-06 DIAGNOSIS — I779 Disorder of arteries and arterioles, unspecified: Secondary | ICD-10-CM | POA: Diagnosis present

## 2021-11-06 DIAGNOSIS — J90 Pleural effusion, not elsewhere classified: Secondary | ICD-10-CM | POA: Diagnosis not present

## 2021-11-06 DIAGNOSIS — Z951 Presence of aortocoronary bypass graft: Secondary | ICD-10-CM

## 2021-11-06 LAB — CBC WITH DIFFERENTIAL/PLATELET
Abs Immature Granulocytes: 0.04 10*3/uL (ref 0.00–0.07)
Basophils Absolute: 0.1 10*3/uL (ref 0.0–0.1)
Basophils Relative: 1 %
Eosinophils Absolute: 0.1 10*3/uL (ref 0.0–0.5)
Eosinophils Relative: 1 %
HCT: 34.9 % — ABNORMAL LOW (ref 39.0–52.0)
Hemoglobin: 11.1 g/dL — ABNORMAL LOW (ref 13.0–17.0)
Immature Granulocytes: 0 %
Lymphocytes Relative: 11 %
Lymphs Abs: 1 10*3/uL (ref 0.7–4.0)
MCH: 28.5 pg (ref 26.0–34.0)
MCHC: 31.8 g/dL (ref 30.0–36.0)
MCV: 89.5 fL (ref 80.0–100.0)
Monocytes Absolute: 0.9 10*3/uL (ref 0.1–1.0)
Monocytes Relative: 10 %
Neutro Abs: 7.3 10*3/uL (ref 1.7–7.7)
Neutrophils Relative %: 77 %
Platelets: 219 10*3/uL (ref 150–400)
RBC: 3.9 MIL/uL — ABNORMAL LOW (ref 4.22–5.81)
RDW: 16.5 % — ABNORMAL HIGH (ref 11.5–15.5)
WBC: 9.4 10*3/uL (ref 4.0–10.5)
nRBC: 0 % (ref 0.0–0.2)

## 2021-11-06 LAB — BASIC METABOLIC PANEL
Anion gap: 5 (ref 5–15)
BUN: 11 mg/dL (ref 8–23)
CO2: 25 mmol/L (ref 22–32)
Calcium: 8.7 mg/dL — ABNORMAL LOW (ref 8.9–10.3)
Chloride: 106 mmol/L (ref 98–111)
Creatinine, Ser: 0.54 mg/dL — ABNORMAL LOW (ref 0.61–1.24)
GFR, Estimated: >60 mL/min (ref >60)
Glucose, Bld: 116 mg/dL — ABNORMAL HIGH (ref 70–99)
Potassium: 3.5 mmol/L (ref 3.5–5.1)
Sodium: 136 mmol/L (ref 135–145)

## 2021-11-06 LAB — BLOOD GAS, VENOUS
Acid-Base Excess: 3.6 mmol/L — ABNORMAL HIGH (ref 0.0–2.0)
Bicarbonate: 26.7 mmol/L (ref 20.0–28.0)
Drawn by: 6892
O2 Saturation: 91.4 %
Patient temperature: 36.9
pCO2, Ven: 35 mmHg — ABNORMAL LOW (ref 44–60)
pH, Ven: 7.49 — ABNORMAL HIGH (ref 7.25–7.43)
pO2, Ven: 61 mmHg — ABNORMAL HIGH (ref 32–45)

## 2021-11-06 LAB — RESP PANEL BY RT-PCR (FLU A&B, COVID) ARPGX2
Influenza A by PCR: NEGATIVE
Influenza B by PCR: NEGATIVE
SARS Coronavirus 2 by RT PCR: NEGATIVE

## 2021-11-06 LAB — PROTIME-INR
INR: 1.3 — ABNORMAL HIGH (ref 0.8–1.2)
Prothrombin Time: 16.2 seconds — ABNORMAL HIGH (ref 11.4–15.2)

## 2021-11-06 LAB — URINALYSIS, ROUTINE W REFLEX MICROSCOPIC
Bilirubin Urine: NEGATIVE
Glucose, UA: NEGATIVE mg/dL
Hgb urine dipstick: NEGATIVE
Ketones, ur: NEGATIVE mg/dL
Leukocytes,Ua: NEGATIVE
Nitrite: NEGATIVE
Protein, ur: NEGATIVE mg/dL
Specific Gravity, Urine: 1.008 (ref 1.005–1.030)
pH: 8 (ref 5.0–8.0)

## 2021-11-06 LAB — GLUCOSE, CAPILLARY
Glucose-Capillary: 108 mg/dL — ABNORMAL HIGH (ref 70–99)
Glucose-Capillary: 136 mg/dL — ABNORMAL HIGH (ref 70–99)
Glucose-Capillary: 138 mg/dL — ABNORMAL HIGH (ref 70–99)

## 2021-11-06 LAB — TROPONIN I (HIGH SENSITIVITY): Troponin I (High Sensitivity): 8 ng/L (ref <18)

## 2021-11-06 LAB — BRAIN NATRIURETIC PEPTIDE: B Natriuretic Peptide: 269 pg/mL — ABNORMAL HIGH (ref 0.0–100.0)

## 2021-11-06 MED ORDER — POTASSIUM CHLORIDE CRYS ER 20 MEQ PO TBCR
20.0000 meq | EXTENDED_RELEASE_TABLET | Freq: Two times a day (BID) | ORAL | Status: DC
  Administered 2021-11-06 – 2021-11-07 (×3): 20 meq via ORAL
  Filled 2021-11-06 (×3): qty 1

## 2021-11-06 MED ORDER — ATORVASTATIN CALCIUM 40 MG PO TABS
40.0000 mg | ORAL_TABLET | Freq: Every evening | ORAL | Status: DC
  Administered 2021-11-06 – 2021-11-08 (×3): 40 mg via ORAL
  Filled 2021-11-06 (×3): qty 1

## 2021-11-06 MED ORDER — IPRATROPIUM BROMIDE 0.03 % NA SOLN: 2.0000 | NASAL | Status: DC | PRN

## 2021-11-06 MED ORDER — ISOSORBIDE MONONITRATE ER 60 MG PO TB24
120.0000 mg | ORAL_TABLET | Freq: Every evening | ORAL | Status: DC
  Administered 2021-11-06 – 2021-11-08 (×3): 120 mg via ORAL
  Filled 2021-11-06 (×3): qty 2

## 2021-11-06 MED ORDER — METOPROLOL SUCCINATE ER 25 MG PO TB24
25.0000 mg | ORAL_TABLET | Freq: Every day | ORAL | Status: DC
  Administered 2021-11-08 – 2021-11-09 (×2): 25 mg via ORAL
  Filled 2021-11-06 (×3): qty 1

## 2021-11-06 MED ORDER — FUROSEMIDE 10 MG/ML IJ SOLN
40.0000 mg | Freq: Once | INTRAMUSCULAR | Status: AC
  Administered 2021-11-06: 40 mg via INTRAVENOUS
  Filled 2021-11-06: qty 4

## 2021-11-06 MED ORDER — LOSARTAN POTASSIUM 50 MG PO TABS
25.0000 mg | ORAL_TABLET | Freq: Every day | ORAL | Status: DC
Start: 2021-11-06 — End: 2021-11-09
  Administered 2021-11-06 – 2021-11-09 (×4): 25 mg via ORAL
  Filled 2021-11-06 (×4): qty 1

## 2021-11-06 MED ORDER — TAMSULOSIN HCL 0.4 MG PO CAPS
0.4000 mg | ORAL_CAPSULE | Freq: Every day | ORAL | Status: DC
  Administered 2021-11-06 – 2021-11-08 (×3): 0.4 mg via ORAL
  Filled 2021-11-06 (×3): qty 1

## 2021-11-06 MED ORDER — OXYCODONE HCL 5 MG PO TABS: 5.0000 mg | ORAL_TABLET | Freq: Four times a day (QID) | ORAL | Status: DC | PRN

## 2021-11-06 MED ORDER — ASPIRIN 81 MG PO TBEC
81.0000 mg | DELAYED_RELEASE_TABLET | Freq: Every day | ORAL | Status: DC
  Administered 2021-11-07 – 2021-11-09 (×3): 81 mg via ORAL
  Filled 2021-11-06 (×3): qty 1

## 2021-11-06 MED ORDER — POLYETHYLENE GLYCOL 3350 17 G PO PACK: 17.0000 g | PACK | Freq: Every day | ORAL | Status: DC | PRN

## 2021-11-06 MED ORDER — PANTOPRAZOLE SODIUM 40 MG PO TBEC
40.0000 mg | DELAYED_RELEASE_TABLET | Freq: Every day | ORAL | Status: DC
  Administered 2021-11-06 – 2021-11-09 (×4): 40 mg via ORAL
  Filled 2021-11-06 (×4): qty 1

## 2021-11-06 MED ORDER — INSULIN ASPART 100 UNIT/ML IJ SOLN
0.0000 [IU] | Freq: Three times a day (TID) | INTRAMUSCULAR | Status: DC
  Administered 2021-11-07: 3 [IU] via SUBCUTANEOUS
  Administered 2021-11-08 (×2): 2 [IU] via SUBCUTANEOUS

## 2021-11-06 MED ORDER — FENTANYL CITRATE PF 50 MCG/ML IJ SOSY: 12.5000 ug | PREFILLED_SYRINGE | INTRAMUSCULAR | Status: DC | PRN

## 2021-11-06 MED ORDER — FUROSEMIDE 10 MG/ML IJ SOLN: 20.0000 mg | Freq: Once | INTRAMUSCULAR | Status: DC

## 2021-11-06 MED ORDER — INSULIN ASPART 100 UNIT/ML IJ SOLN
3.0000 [IU] | Freq: Three times a day (TID) | INTRAMUSCULAR | Status: DC
Start: 2021-11-06 — End: 2021-11-09
  Administered 2021-11-06 – 2021-11-09 (×8): 3 [IU] via SUBCUTANEOUS

## 2021-11-06 MED ORDER — WARFARIN - PHARMACIST DOSING INPATIENT: Freq: Every day | Status: DC

## 2021-11-06 MED ORDER — TRAZODONE HCL 50 MG PO TABS: 25.0000 mg | ORAL_TABLET | Freq: Every evening | ORAL | Status: DC | PRN

## 2021-11-06 MED ORDER — ONDANSETRON HCL 4 MG PO TABS: 4.0000 mg | ORAL_TABLET | Freq: Four times a day (QID) | ORAL | Status: DC | PRN

## 2021-11-06 MED ORDER — FUROSEMIDE 10 MG/ML IJ SOLN
40.0000 mg | Freq: Two times a day (BID) | INTRAMUSCULAR | Status: DC
  Administered 2021-11-06: 40 mg via INTRAVENOUS
  Filled 2021-11-06: qty 4

## 2021-11-06 MED ORDER — ALBUTEROL SULFATE (2.5 MG/3ML) 0.083% IN NEBU: 2.5000 mg | INHALATION_SOLUTION | RESPIRATORY_TRACT | Status: DC | PRN

## 2021-11-06 MED ORDER — AMLODIPINE BESYLATE 5 MG PO TABS
10.0000 mg | ORAL_TABLET | Freq: Every day | ORAL | Status: DC
  Administered 2021-11-06 – 2021-11-09 (×4): 10 mg via ORAL
  Filled 2021-11-06 (×4): qty 2

## 2021-11-06 MED ORDER — MIRABEGRON ER 25 MG PO TB24
25.0000 mg | ORAL_TABLET | Freq: Every day | ORAL | Status: DC
  Administered 2021-11-06 – 2021-11-09 (×4): 25 mg via ORAL
  Filled 2021-11-06 (×4): qty 1

## 2021-11-06 MED ORDER — NITROGLYCERIN 0.4 MG SL SUBL: 0.4000 mg | SUBLINGUAL_TABLET | SUBLINGUAL | Status: DC | PRN

## 2021-11-06 MED ORDER — ONDANSETRON HCL 4 MG/2ML IJ SOLN: 4.0000 mg | Freq: Four times a day (QID) | INTRAMUSCULAR | Status: DC | PRN

## 2021-11-06 MED ORDER — ALBUTEROL SULFATE HFA 108 (90 BASE) MCG/ACT IN AERS: 2.0000 | INHALATION_SPRAY | Freq: Four times a day (QID) | RESPIRATORY_TRACT | Status: DC | PRN

## 2021-11-06 MED ORDER — IPRATROPIUM-ALBUTEROL 0.5-2.5 (3) MG/3ML IN SOLN: 3.0000 mL | RESPIRATORY_TRACT | Status: DC | PRN

## 2021-11-06 MED ORDER — ACETAMINOPHEN 325 MG PO TABS: 650.0000 mg | ORAL_TABLET | Freq: Four times a day (QID) | ORAL | Status: DC | PRN

## 2021-11-06 MED ORDER — WARFARIN SODIUM 1 MG PO TABS
2.0000 mg | ORAL_TABLET | Freq: Once | ORAL | Status: AC
  Administered 2021-11-06: 2 mg via ORAL
  Filled 2021-11-06 (×2): qty 2

## 2021-11-06 MED ORDER — ALBUTEROL SULFATE HFA 108 (90 BASE) MCG/ACT IN AERS: 2.0000 | INHALATION_SPRAY | RESPIRATORY_TRACT | Status: DC | PRN

## 2021-11-06 MED ORDER — ACETAMINOPHEN 650 MG RE SUPP: 650.0000 mg | Freq: Four times a day (QID) | RECTAL | Status: DC | PRN

## 2021-11-06 NOTE — Hospital Course (Addendum)
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  He had an episode of gross hematuria and recently followed up with urology Dr. [name] where he had cystoscopy performed.  His gross hematuria had resolved.  He was brought into the emergency department by [facility] for increasing shortness of breath for 1 day.  He says that he had normally uses oxygen only at night but yesterday started wearing it at all times and became shortness of breath with rest.  He denied having any chest pain.  He has had whitish sputum production and reported feeling overall weak.  His workup in the ED revealed an elevated cardiac BNP and chest x-ray with findings of congestive heart failure.  He was started on IV Lasix and admission was requested for further management.

## 2021-11-06 NOTE — ED Notes (Signed)
Report given to Whitney RN

## 2021-11-06 NOTE — Progress Notes (Signed)
ANTICOAGULATION CONSULT NOTE - Initial Consult  Pharmacy Consult for warfarin Indication: atrial fibrillation  Allergies  Allergen Reactions   Niacin Itching and Rash    Burning sensation   Contrast Media [Iodinated Contrast Media] Nausea And Vomiting   Iodine-131 Nausea And Vomiting   Nsaids Other (See Comments)    Taking Coumadin    Patient Measurements: Height: '5\' 10"'$  (177.8 cm) Weight: 90.7 kg (200 lb) IBW/kg (Calculated) : 73 Heparin Dosing Weight:   Vital Signs: Temp: 98.4 F (36.9 C) (08/07 1143) Temp Source: Oral (08/07 1143) BP: 159/92 (08/07 1330) Pulse Rate: 50 (08/07 1330)  Labs: Recent Labs    11/06/21 1220  HGB 11.1*  HCT 34.9*  PLT 219  CREATININE 0.54*  TROPONINIHS 8    Estimated Creatinine Clearance: 86.2 mL/min (A) (by C-G formula based on SCr of 0.54 mg/dL (L)).   Medical History: Past Medical History:  Diagnosis Date   AAA (abdominal aortic aneurysm) ([place])    Followed by Dr. Sherren Mocha Early   Arthritis    CAD (coronary artery disease)    a. CABG 2000.   Cancer [facility])    Prostate:  Radiation Tx   Carotid artery disease ([place])    Chest pain    precordial. mild chronic .[name]... nonischemic   CHF (congestive heart failure) (HCC)    Chronic edema    Coronary artery disease    a. Nuclear, January, 2008, no ischemia b. Cath 08/2012- 1/4 patent grafts, RCA CTO, no flow-limiting disease, medically managed   Diabetes mellitus without complication ([place])    Dizziness 02/2011   Dyslipidemia    Fall    GERD (gastroesophageal reflux disease)    TAKES TUMS & ROLAIDS AS NEEDED   Hx of CABG 2000   Hypertension    Myocardial infarction ([place]) 1999   Neck pain 02/2011   Pneumonia    Pulmonary hypertension ([place])    Renal artery stenosis (HCC)    50-70%   S/P femoropopliteal bypass surgery    Dr. Donnetta Hutching   Sinus bradycardia    Asymptomatic    Medications:  (Not in a hospital admission)   Assessment: Pharmacy consulted to dose warfarin in patient with  atrial fibrillation. Home dose listed as alternating doses of 1 mg and 2 mg. Last dose of 2 mg on 8/6.  CBC WNL INR 1.3 Goal of Therapy:  INR 2-3 Monitor platelets by anticoagulation protocol: Yes   Plan:  Warfarin 2 mg x 1 dose. Monitor daily INR and s/s of bleeding.  Margot Ables, PharmD Clinical Pharmacist 11/06/2021 2:51 PM

## 2021-11-06 NOTE — ED Triage Notes (Signed)
Pt states while he was up walking around at home his O2 sats decreased to upper 70's

## 2021-11-06 NOTE — H&P (Signed)
History and Physical  [facility]  Troy [name] CMK:[id number] DOB: 1940-06-20 DOA: 11/06/2021  PCP: Sharilyn Sites, MD  Patient coming from: home by [facility]  Level of care: Telemetry  I have personally briefly reviewed patient's old medical records in [place]  Chief Complaint: SOB   HPI: Troy [name] is a Failure81 year old gentleman with a complex past medical history including coronary artery disease, heart failure preserved EF, severe pulmonary hypertension, type 2 diabetes mellitus, renal artery stenosis, hyperlipidemia, essential hypertension, anticoagulated with warfarin, esophageal dysphagia, peripheral vascular disease, atrial fibrillation, 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  He had an episode of gross hematuria and recently followed up with urology Dr. [name] where he had cystoscopy performed.  His gross hematuria had resolved.  He was brought into the emergency department by [facility] for increasing shortness of breath for 1 day.  He says that he had normally uses oxygen only at night but yesterday started wearing it at all times and became shortness of breath with rest.  He denied having any chest pain.  He has had whitish sputum production and reported feeling overall weak.  His workup in the ED revealed an elevated cardiac BNP and chest x-ray with findings of congestive heart failure.  He was started on IV Lasix and admission was requested for further management.   Review of Systems: Review of Systems  Constitutional: Negative.   HENT: Negative.    Eyes: Negative.   Respiratory:  Positive for sputum production, shortness of breath and wheezing.   Cardiovascular: Negative.   Gastrointestinal: Negative.   Genitourinary:  Positive for frequency and urgency.       Urinary incontinence since recent cystoscopy  Musculoskeletal: Negative.    Skin: Negative.   Neurological: Negative.   Endo/Heme/Allergies: Negative.   Psychiatric/Behavioral: Negative.    All other systems reviewed and are negative.    Past Medical History:  Diagnosis Date   AAA (abdominal aortic aneurysm) ([place])    Followed by Dr. Sherren Mocha Early   Arthritis    CAD (coronary artery disease)    a. CABG 2000.   Cancer [facility])    Prostate:  Radiation Tx   Carotid artery disease ([place])    Chest pain    precordial. mild chronic .[name]... nonischemic   CHF (congestive heart failure) (HCC)    Chronic edema    Coronary artery disease    a. Nuclear, January, 2008, no ischemia b. Cath 08/2012- 1/4 patent grafts, RCA CTO, no flow-limiting disease, medically managed   Diabetes mellitus without complication ([place])    Dizziness 02/2011   Dyslipidemia    Fall    GERD (gastroesophageal reflux disease)    TAKES TUMS & ROLAIDS AS NEEDED   Hx of CABG 2000   Hypertension    Myocardial infarction ([place]) 1999   Neck pain 02/2011   Pneumonia    Pulmonary hypertension ([place])    Renal artery stenosis (HCC)    50-70%   S/P femoropopliteal bypass surgery    Dr. Donnetta Hutching   Sinus bradycardia    Asymptomatic    Past Surgical History:  Procedure Laterality Date   CARDIAC CATHETERIZATION  09/26/2012   1/4 patent bypass (occluded SVG-PDA, SVG-OM, LIMA-LAD), SVG-diagonal patent and fills the diagonal and LAD, distal RCA occlusion with left to right collateralization, patent circumflex, LAD with no flow-limiting disease and antegrade flow competitively from SVG-diagonal; EF 60-65%  COLONOSCOPY  11/30/2009   VZD:GLOVFIEPPIRJJO. next TCS 11/2019   COLONOSCOPY WITH PROPOFOL N/A 12/18/2019   Procedure: COLONOSCOPY WITH PROPOFOL;  Surgeon: Harvel Quale, MD;  [facility];  Service: Gastroenterology;  Laterality: N/A;  1030   CORONARY ARTERY BYPASS GRAFT  2000   CYSTOSCOPY N/A 08/31/2021   Procedure: CYSTOSCOPY;  Surgeon: Cleon Gustin, MD;  Location: [facility] ORS;   Service: Urology;  Laterality: N/A;  pt knows to arrive at 7:00   ESOPHAGOGASTRODUODENOSCOPY N/A 08/12/2013   ACZ:[id number] peptic stricture with erosive refluxesophagitis - status post Maloney dilation. Hiatal hernia. Abnormalgastric mucosa. Deformity of the pyloric channel suggestive ofprior peptic ulcer disease. Duodenal bulbar diverticulum Statuspost gastric biopsy. h.pylori   LEFT HEART CATHETERIZATION WITH CORONARY/GRAFT ANGIOGRAM N/A 09/26/2012   Procedure: LEFT HEART CATHETERIZATION WITH Beatrix Fetters;  Surgeon: Burnell Blanks, MD;  Location: [facility] CATH LAB;  Service: Cardiovascular;  Laterality: N/A;   MALONEY DILATION N/A 08/12/2013   Procedure: Venia Minks DILATION;  Surgeon: Daneil Dolin, MD;  Location: [facility];  Service: Endoscopy;  Laterality: N/A;   POLYPECTOMY  12/18/2019   Procedure: POLYPECTOMY;  Surgeon: Harvel Quale, MD;  [facility];  Service: Gastroenterology;;  ascending colon polyp    PR VEIN BYPASS GRAFT,AORTO-FEM-POP Right 07/19/1999   PR VEIN BYPASS GRAFT,AORTO-FEM-POP Left 05/02/2006   RIGHT HEART CATH AND CORONARY/GRAFT ANGIOGRAPHY N/A 04/11/2020   Procedure: RIGHT HEART CATH AND CORONARY/GRAFT ANGIOGRAPHY;  Surgeon: Larey Dresser, MD;  Location: [place] CV LAB;  Service: Cardiovascular;  Laterality: N/A;   SAVORY DILATION N/A 08/12/2013   Procedure: SAVORY DILATION;  Surgeon: Daneil Dolin, MD;  [facility]: [facility];  Service: Endoscopy;  Laterality: N/A;   TRANSURETHRAL RESECTION OF BLADDER TUMOR N/A 08/31/2021   Procedure: TRANSURETHRAL RESECTION OF BLADDER TUMOR (TURBT);  Surgeon: Cleon Gustin, MD;  Location: [facility] ORS;  Service: Urology;  Laterality: N/A;   WRIST SURGERY     cyst removal     reports that he quit smoking about 3 years ago. His smoking use included cigarettes. He has a 140.00 pack-year smoking history. He has never used smokeless tobacco. He reports current alcohol use. He reports that he  does not use drugs.  Allergies  Allergen Reactions   Niacin Itching and Rash    Burning sensation   Contrast Media [Iodinated Contrast Media] Nausea And Vomiting   Iodine-131 Nausea And Vomiting   Nsaids Other (See Comments)    Taking Coumadin    Family History  Problem Relation Age of Onset   Deep vein thrombosis Father    Lung cancer Sister    Diabetes Sister    Heart disease Sister        After age 32   Hyperlipidemia Sister    Hypertension Sister    Lung cancer Sister    Breast cancer Sister    Hypertension Mother    Diabetes Sister    Coronary artery disease Other        family hx of   Cancer Brother        "crab cancer"   Heart disease Brother    Heart attack Brother    Heart attack Daughter    Colon cancer Neg Hx    Stroke Neg Hx     Prior to Admission medications   Medication Sig Start Date End Date Taking? Authorizing Provider  acetaminophen (TYLENOL) 325 MG tablet Take 2 tablets (650 mg total) by mouth every 6 (six) hours as needed  for mild pain, fever or headache (or Fever >/= 101). 03/29/20  Yes Emokpae, Courage, MD  albuterol (VENTOLIN HFA) 108 (90 Base) MCG/ACT inhaler Inhale 2 puffs into the lungs every 6 (six) hours as needed for wheezing or shortness of breath. 03/29/20  Yes Emokpae, Courage, MD  amLODipine (NORVASC) 10 MG tablet Take 10 mg by mouth daily.   Yes [provider]  aspirin 81 MG EC tablet Take 1 tablet (81 mg total) by mouth daily with breakfast. 03/29/20  Yes Emokpae, Courage, MD  atorvastatin (LIPITOR) 40 MG tablet Take 1 tablet (40 mg total) by mouth daily. 11/30/13  Yes Carlena Bjornstad, MD  famotidine (PEPCID) 20 MG tablet One after supper 06/07/20  Yes Tanda Rockers, MD  HYDROcodone-acetaminophen (NORCO/VICODIN) 5-325 MG tablet Take 1 tablet by mouth daily as needed for moderate pain. 08/31/21  Yes McKenzie, Candee Furbish, MD  ipratropium (ATROVENT) 0.03 % nasal spray Place 2 sprays into both nostrils as needed for rhinitis.   Yes  [provider]  ipratropium-albuterol (DUONEB) 0.5-2.5 (3) MG/3ML SOLN Take 3 mLs by nebulization every 4 (four) hours as needed. 03/29/20  Yes Roxan Hockey, MD  isosorbide mononitrate (IMDUR) 60 MG 24 hr tablet Take 2 tablets (120 mg total) by mouth every evening. 04/11/20  Yes Larey Dresser, MD  losartan (COZAAR) 25 MG tablet Take 1 tablet (25 mg total) by mouth daily. 03/29/20  Yes Roxan Hockey, MD  metFORMIN (GLUCOPHAGE) 1000 MG tablet Take 500 mg by mouth 2 (two) times daily with a meal.   Yes [provider]  cefUROXime (CEFTIN) 500 MG tablet Take 1 tablet (500 mg total) by mouth 2 (two) times daily with a meal. Patient not taking: Reported on 11/06/2021 10/27/21   Cleon Gustin, MD  metoprolol succinate (TOPROL-XL) 25 MG 24 hr tablet Take 1 tablet (25 mg total) by mouth daily. Pt. Needs to make an appt. With Cardiologist in order to receive further refills. Thank You. 1st Attempt. 05/15/21   Dunn, Nedra Hai, PA-C  metoprolol tartrate (LOPRESSOR) 50 MG tablet Take by mouth.    [provider]  mirabegron ER (MYRBETRIQ) 25 MG TB24 tablet Take 1 tablet (25 mg total) by mouth daily. 10/04/21   McKenzie, Candee Furbish, MD  montelukast (SINGULAIR) 10 MG tablet Take by mouth.    [provider]  nitroGLYCERIN (NITROSTAT) 0.4 MG SL tablet Place 1 tablet (0.4 mg total) under the tongue every 5 (five) minutes x 3 doses as needed for chest pain. 12/14/19   Dunn, Nedra Hai, PA-C  nitroGLYCERIN (NITROSTAT) 0.4 MG SL tablet Place under the tongue.    [provider]  OXYGEN Inhale 2 L into the lungs at bedtime.    [provider]  pantoprazole (PROTONIX) 40 MG tablet Take 1 tablet (40 mg total) by mouth daily. Take 30-60 min before first meal of the day 06/07/20   Tanda Rockers, MD  potassium chloride (KLOR-CON) 10 MEQ tablet Take 1 tablet (10 mEq total) by mouth daily. Only take while taking Torsemide 04/04/20   Larey Dresser, MD  potassium chloride  (KLOR-CON) 10 MEQ tablet Take by mouth.    [provider]  tamsulosin (FLOMAX) 0.4 MG CAPS capsule Take 1 capsule (0.4 mg total) by mouth daily after supper. 10/27/21   McKenzie, Candee Furbish, MD  torsemide (DEMADEX) 10 MG tablet Take 1 tablet (10 mg total) by mouth every other day. Take 20 mg for weight gain over 3 lbs in 24  hours 04/13/20   Larey Dresser, MD  warfarin (COUMADIN) 1 MG tablet Take 1 mg by mouth every evening. MANAGED BY PMD 09/27/12   Meriel Pica, PA-C    Physical Exam: Vitals:   11/06/21 1140 11/06/21 1143 11/06/21 1315 11/06/21 1330  BP:  (!) 159/53 (!) 169/68 (!) 159/92  Pulse:  73 76 (!) 50  Resp:  20 (!) 23 15  Temp:  98.4 F (36.9 C)    TempSrc:  Oral    SpO2:  97% 96% 94%  Weight: 90.7 kg     Height: '5\' 10"'$  (1.778 m)       Constitutional: NAD, calm, comfortable Eyes: PERRL, lids and conjunctivae normal ENMT: Mucous membranes are moist. Posterior pharynx clear of any exudate or lesions.Normal dentition.  Neck: normal, supple, no masses, no thyromegaly Respiratory: clear to auscultation bilaterally, no wheezing, no crackles. Normal respiratory effort. No accessory muscle use.  Cardiovascular: normal s1, s2 sounds, no murmurs / rubs / gallops. No extremity edema. 2+ pedal pulses. No carotid bruits.  Abdomen: no tenderness, no masses palpated. No hepatosplenomegaly. Bowel sounds positive.  Musculoskeletal: no clubbing / cyanosis. No joint deformity upper and lower extremities. Good ROM, no contractures. Normal muscle tone.  Skin: no rashes, lesions, ulcers. No induration Neurologic: CN 2-12 grossly intact. Sensation intact, DTR normal. Strength 5/5 in all 4.  Psychiatric: Normal judgment and insight. Alert and oriented x 3. Normal mood.   Labs on Admission: I have personally reviewed following labs and imaging studies  CBC: Recent Labs  Lab 11/06/21 1220  WBC 9.4  NEUTROABS 7.3  HGB 11.1*  HCT 34.9*  MCV 89.5  PLT 956   Basic Metabolic  Panel: Recent Labs  Lab 11/06/21 1220  NA 136  K 3.5  CL 106  CO2 25  GLUCOSE 116*  BUN 11  CREATININE 0.54*  CALCIUM 8.7*   GFR: Estimated Creatinine Clearance: 86.2 mL/min (A) (by C-G formula based on SCr of 0.54 mg/dL (L)). Liver Function Tests: No results for input(s): "AST", "ALT", "ALKPHOS", "BILITOT", "PROT", "ALBUMIN" in the last 168 hours. No results for input(s): "LIPASE", "AMYLASE" in the last 168 hours. No results for input(s): "AMMONIA" in the last 168 hours. Coagulation Profile: No results for input(s): "INR", "PROTIME" in the last 168 hours. Cardiac Enzymes: No results for input(s): "CKTOTAL", "CKMB", "CKMBINDEX", "TROPONINI" in the last 168 hours. BNP (last 3 results) No results for input(s): "PROBNP" in the last 8760 hours. HbA1C: No results for input(s): "HGBA1C" in the last 72 hours. CBG: No results for input(s): "GLUCAP" in the last 168 hours. Lipid Profile: No results for input(s): "CHOL", "HDL", "LDLCALC", "TRIG", "CHOLHDL", "LDLDIRECT" in the last 72 hours. Thyroid Function Tests: No results for input(s): "TSH", "T4TOTAL", "FREET4", "T3FREE", "THYROIDAB" in the last 72 hours. Anemia Panel: No results for input(s): "VITAMINB12", "FOLATE", "FERRITIN", "TIBC", "IRON", "RETICCTPCT" in the last 72 hours. Urine analysis:    Component Value Date/Time   APPEARANCEUR Clear 10/27/2021 1312   GLUCOSEU Negative 10/27/2021 1312   BILIRUBINUR Negative 10/27/2021 1312   PROTEINUR 2+ (A) 10/27/2021 1312   NITRITE Negative 10/27/2021 1312   LEUKOCYTESUR 3+ (A) 10/27/2021 1312    Radiological Exams on Admission: DG Chest 2 View  Result Date: 11/06/2021 CLINICAL DATA:  Shortness of breath. Cough. Hypertension. Ex-smoker. Prostate cancer. EXAM: CHEST - 2 VIEW COMPARISON:  03/02/2021 CT.  10/05/2020 plain film. FINDINGS: Numerous leads and wires project over the chest. Midline trachea. Mild cardiomegaly. Atherosclerosis in the transverse aorta. Tiny bilateral  pleural  effusions are most apparent on the lateral view. No pneumothorax. Interstitial edema is mild. Patchy bibasilar airspace disease. IMPRESSION: Mild cardiomegaly with congestive heart failure and small bilateral pleural effusions. Patchy bibasilar airspace disease, favoring atelectasis. Aortic Atherosclerosis (ICD10-I70.0). Electronically Signed   By: Abigail Miyamoto M.D.   On: 11/06/2021 12:39    EKG: Independently reviewed. Controlled rate atrial fibrillation   Assessment/Plan Principal Problem:   Acute heart failure with preserved ejection fraction (HFpEF) (HCC) Active Problems:   --Severe pulmonary HTN, 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 ----Pulmonary HTN    Hyperlipidemia   CORONARY ARTERY BYPASS GRAFT, HX OF   Renal artery stenosis (HCC)   S/P femoropopliteal bypass surgery   Carotid artery disease (HCC)   Type 2 diabetes mellitus (HCC)   Hypertension   CAD (coronary artery disease)   Esophageal dysphagia   Peripheral vascular disease, unspecified (HCC)   Atrial fibrillation (HCC)   Nocturnal hypoxemia   Acquired thrombophilia (HCC)    Acute HFpEF - pt presents with symptomatic volume overload as evidenced on chest x-ray, physical exam and elevated BNP -Patient reports compliance with home diuretics however recently hospitalized for dehydration and likely treated with IV fluid hydration -Will be diuresed with IV Lasix -Monitor intake/output, daily weight -2 g sodium diet and fluid restriction - follow up BNP in AM after starting diuresis - obtain ReDs Clip reading on admission and follow  Severe Pulmonary Hypertension  - Pt is being followed by advanced HF clinic - Will need outpatient follow up after discharge   Atrial Fibrillation/acquired thrombophilia  - HR well controlled, pt is anticoagulated with warfarin -Pharmacy consult for warfarin management in hospital -Check daily PT/INR  Hyperlipidemia - resume home medications  Type 2 diabetes mellitus, controlled with vascular  complications  -SSI coverage and frequent CBG monitoring in addition to prandial NovoLog ordered - further adjustments pending CBG readings  Esophageal dysphagia  -Patient reports that he is scheduled to have a barium swallow evaluation at [facility] tomorrow -Will consult to the SLP to see if he can have the study done while he is inpatient  CAD s/p CABG at age 43 - he is being medically managed now and we have resumed his cardiac medications  Acute on chronic respiratory distress - exacerbated by acute heart failure  - continuous supplemental oxygen ordered  Essential hypertension  - resuming home medication and follow closely   PVD - severe  - he has had multiple bypass surgeries - resume medical management   DVT prophylaxis: warfarin/TED hose  Code Status: Full   Family Communication:   Disposition Plan: anticipating home with [facility]   Consults called:  pharmacy Admission status: INP  Level of care: Telemetry Irwin Brakeman MD [facility] How to contact the [facility] Attending or Consulting provider 7A - 7P or covering provider during after hours 7P -7A, for this patient?  Check the care team in [facility] and look for a) attending/consulting [facility] provider listed and b) the [facility] team listed Log into [email or web address] and use [facility] universal password to access. If you do not have the password, please contact the hospital operator. Locate the [facility] provider you are looking for under [facility] and page to a number that you can be directly reached. If you still have difficulty reaching the provider, please page the [facility] (Director on Call) for the Hospitalists listed on amion for assistance.   If 7PM-7AM, please contact night-coverage [email or web address] Password [facility]  11/06/2021, 2:32 PM

## 2021-11-06 NOTE — ED Notes (Signed)
Dr. Wynetta Emery in with pt at this time; pt given cup of water

## 2021-11-06 NOTE — Progress Notes (Signed)
Patient transported from ED to 328. Patient calm and cooperative with no complaints.

## 2021-11-06 NOTE — ED Triage Notes (Signed)
Pt brought in by [facility] for c/o increased sob x one day; pt states he usually wears O2 at night but yesterday he noticed he started to have to wear the O2 all the time and became sob even with rest; pt denies any chest pain; pt states he has been coughing up white sputum and he feels weak all over

## 2021-11-06 NOTE — ED Provider Notes (Signed)
[facility] EMERGENCY DEPARTMENT Provider Note   CSN: [id number] Arrival date & time: 11/06/21  1132     History  Chief Complaint  Patient presents with   Shortness of Breath         Troy [name] is a 81 y.o. male with a past medical history significant for hypertension, hyperlipidemia, history of coronary artery disease, previous CABG, previous abdominal aortic aneurysm, CHF, pulmonary hypertension presents with shortness of breath over the last 2 days.  Patient reports that he normally wears oxygen at night but yesterday he noticed that he needed to be wearing it all the time.  Patient reports that he has been coughing up white sputum, had some nausea and vomiting, feels weak all over.  He denies chest pain, headache, vision changes, double vision.  He denies feeling of heart racing, loss of consciousness, fall.  Patient reports that he did not take his fluid pill over the last day because he was urinating so frequently.  Patient reports that he was recently treated for urinary tract infection, completed antibiotics yesterday, still having some dysuria.   Shortness of Breath      Home Medications Prior to Admission medications   Medication Sig Start Date End Date Taking? Authorizing Provider  acetaminophen (TYLENOL) 325 MG tablet Take 2 tablets (650 mg total) by mouth every 6 (six) hours as needed for mild pain, fever or headache (or Fever >/= 101). 03/29/20   Roxan Hockey, MD  albuterol (VENTOLIN HFA) 108 (90 Base) MCG/ACT inhaler Inhale 2 puffs into the lungs every 6 (six) hours as needed for wheezing or shortness of breath. 03/29/20   Roxan Hockey, MD  amLODipine (NORVASC) 10 MG tablet Take 10 mg by mouth daily.    [provider]  amLODipine (NORVASC) 5 MG tablet Take 1 tablet (5 mg total) by mouth daily. For BP 03/29/20   Roxan Hockey, MD  aspirin 81 MG EC tablet Take 1 tablet (81 mg total) by mouth daily with breakfast. 03/29/20   Denton Brick, Courage, MD   atorvastatin (LIPITOR) 40 MG tablet Take 1 tablet (40 mg total) by mouth daily. 11/30/13   Carlena Bjornstad, MD  cefUROXime (CEFTIN) 500 MG tablet Take 1 tablet (500 mg total) by mouth 2 (two) times daily with a meal. 10/27/21   McKenzie, Candee Furbish, MD  famotidine (PEPCID) 20 MG tablet One after supper 06/07/20   Tanda Rockers, MD  HYDROcodone-acetaminophen (NORCO/VICODIN) 5-325 MG tablet Take 1 tablet by mouth daily as needed for moderate pain. 08/31/21   McKenzie, Candee Furbish, MD  ipratropium (ATROVENT) 0.03 % nasal spray Place 2 sprays into both nostrils as needed for rhinitis.    [provider]  ipratropium-albuterol (DUONEB) 0.5-2.5 (3) MG/3ML SOLN Take 3 mLs by nebulization every 4 (four) hours as needed. 03/29/20   Roxan Hockey, MD  isosorbide mononitrate (IMDUR) 60 MG 24 hr tablet Take 2 tablets (120 mg total) by mouth every evening. 04/11/20   Larey Dresser, MD  losartan (COZAAR) 25 MG tablet Take 1 tablet (25 mg total) by mouth daily. 03/29/20   Roxan Hockey, MD  metFORMIN (GLUCOPHAGE) 1000 MG tablet Take 500 mg by mouth 2 (two) times daily with a meal.    [provider]  metoprolol succinate (TOPROL-XL) 25 MG 24 hr tablet Take 1 tablet (25 mg total) by mouth daily. Pt. Needs to make an appt. With Cardiologist in order to receive further refills. Thank You. 1st Attempt. 05/15/21   Charlie Pitter, PA-C  mirabegron ER (MYRBETRIQ) 25 MG TB24 tablet Take 1 tablet (25 mg total) by mouth daily. 10/04/21   McKenzie, Candee Furbish, MD  nitroGLYCERIN (NITROSTAT) 0.4 MG SL tablet Place 1 tablet (0.4 mg total) under the tongue every 5 (five) minutes x 3 doses as needed for chest pain. 12/14/19   Dunn, Nedra Hai, PA-C  OXYGEN Inhale 2 L into the lungs at bedtime.    [provider]  pantoprazole (PROTONIX) 40 MG tablet Take 1 tablet (40 mg total) by mouth daily. Take 30-60 min before first meal of the day 06/07/20   Tanda Rockers, MD  potassium chloride (KLOR-CON) 10 MEQ tablet  Take 1 tablet (10 mEq total) by mouth daily. Only take while taking Torsemide 04/04/20   Larey Dresser, MD  tamsulosin (FLOMAX) 0.4 MG CAPS capsule Take 1 capsule (0.4 mg total) by mouth daily after supper. 10/27/21   McKenzie, Candee Furbish, MD  torsemide (DEMADEX) 10 MG tablet Take 1 tablet (10 mg total) by mouth every other day. Take 20 mg for weight gain over 3 lbs in 24 hours 04/13/20   Larey Dresser, MD  warfarin (COUMADIN) 1 MG tablet Take 1 mg by mouth every evening. MANAGED BY PMD 09/27/12   Danella Sensing A, PA-C      Allergies    Niacin, Contrast media [iodinated contrast media], Iodine-131, and Nsaids    Review of Systems   Review of Systems  Respiratory:  Positive for shortness of breath.   All other systems reviewed and are negative.   Physical Exam Updated Vital Signs BP (!) 169/68   Pulse 76   Temp 98.4 F (36.9 C) (Oral)   Resp (!) 23   Ht '5\' 10"'$  (1.778 m)   Wt 90.7 kg   SpO2 96%   BMI 28.70 kg/m  Physical Exam Vitals and nursing note reviewed.  Constitutional:      General: He is not in acute distress.    Appearance: Normal appearance.  HENT:     Head: Normocephalic and atraumatic.  Eyes:     General:        Right eye: No discharge.        Left eye: No discharge.  Cardiovascular:     Rate and Rhythm: Normal rate and regular rhythm.     Heart sounds: No murmur heard.    No friction rub. No gallop.  Pulmonary:     Effort: Pulmonary effort is normal.     Breath sounds: Normal breath sounds.     Comments: Respiratory effort, tachypnea, no significant accessory lung sounds with some mild bibasilar crackles.  Patient with oxygen desaturation to 86% on ambulation despite 3 L nasal cannula. Abdominal:     General: Bowel sounds are normal.     Palpations: Abdomen is soft.  Musculoskeletal:     Comments: 1+ edema bilateral lower extremities  Skin:    General: Skin is warm and dry.     Capillary Refill: Capillary refill takes less than 2 seconds.   Neurological:     Mental Status: He is alert and oriented to person, place, and time.  Psychiatric:        Mood and Affect: Mood normal.        Behavior: Behavior normal.     ED Results / Procedures / Treatments   Labs (all labs ordered are listed, but only abnormal results are displayed) Labs Reviewed  BASIC METABOLIC PANEL - Abnormal; Notable for the following components:  Result Value   Glucose, Bld 116 (*)    Creatinine, Ser 0.54 (*)    Calcium 8.7 (*)    All other components within normal limits  CBC WITH DIFFERENTIAL/PLATELET - Abnormal; Notable for the following components:   RBC 3.90 (*)    Hemoglobin 11.1 (*)    HCT 34.9 (*)    RDW 16.5 (*)    All other components within normal limits  BLOOD GAS, VENOUS - Abnormal; Notable for the following components:   pH, Ven 7.49 (*)    pCO2, Ven 35 (*)    pO2, Ven 61 (*)    Acid-Base Excess 3.6 (*)    All other components within normal limits  BRAIN NATRIURETIC PEPTIDE - Abnormal; Notable for the following components:   B Natriuretic Peptide 269.0 (*)    All other components within normal limits  RESP PANEL BY RT-PCR (FLU A&B, COVID) ARPGX2  URINALYSIS, ROUTINE W REFLEX MICROSCOPIC  TROPONIN I (HIGH SENSITIVITY)  TROPONIN I (HIGH SENSITIVITY)    EKG None  Radiology DG Chest 2 View  Result Date: 11/06/2021 CLINICAL DATA:  Shortness of breath. Cough. Hypertension. Ex-smoker. Prostate cancer. EXAM: CHEST - 2 VIEW COMPARISON:  03/02/2021 CT.  10/05/2020 plain film. FINDINGS: Numerous leads and wires project over the chest. Midline trachea. Mild cardiomegaly. Atherosclerosis in the transverse aorta. Tiny bilateral pleural effusions are most apparent on the lateral view. No pneumothorax. Interstitial edema is mild. Patchy bibasilar airspace disease. IMPRESSION: Mild cardiomegaly with congestive heart failure and small bilateral pleural effusions. Patchy bibasilar airspace disease, favoring atelectasis. Aortic Atherosclerosis  (ICD10-I70.0). Electronically Signed   By: Abigail Miyamoto M.D.   On: 11/06/2021 12:39    Procedures Procedures    Medications Ordered in ED Medications  albuterol (VENTOLIN HFA) 108 (90 Base) MCG/ACT inhaler 2 puff (has no administration in time range)  furosemide (LASIX) injection 40 mg (has no administration in time range)    ED Course/ Medical Decision Making/ A&P Clinical Course as of 11/06/21 1359  Mon Nov 06, 2021  1334 Patient with desaturation to 86% on ambulation to the bathroom despite 3 L nasal cannula.  Concern for worsening pulmonary hypertension, no evidence of acute ACS, some evidence of mild fluid overload but BNP only 269 today.  Given new oxygen requirement, desaturation with ambulation think patient would benefit from admission, likely steroids to help with pulmonary hypertension.  We will consult hospitalist at this time. [CP]    Clinical Course User Index [CP] Anselmo Pickler, PA-C                           Medical Decision Making Amount and/or Complexity of Data Reviewed Labs: ordered. Radiology: ordered.  Risk Prescription drug management. Decision regarding hospitalization.   This patient is a 81 y.o. male who presents to the ED for concern of shortness of breath, new oxygen requirement for the last 2 days , this involves an extensive number of treatment options, and is a complaint that carries with it a high risk of complications and morbidity. The emergent differential diagnosis prior to evaluation includes, but is not limited to,  PE, pneumonia, acute bronchitis, pulmonary hypertension worsening, ARDS, COVID, flu, versus other.   This is not an exhaustive differential.   Past Medical History / Co-morbidities / Social History: hypertension, hyperlipidemia, history of coronary artery disease, previous CABG, previous abdominal aortic aneurysm, CHF, pulmonary hypertension  Additional history: Chart reviewed. Pertinent results include: Reviewed lab  work,  imaging from recent previous emergency department visits, admissions, outpatient neurology, pulmonology, cardiology visits  Physical Exam: Physical exam performed. The pertinent findings include: Patient with increased work of breathing, tachypnea, oxygen desaturation on ambulation despite 3 L nasal cannula, otherwise overall well-appearing.  He has some 1+ pitting edema bilateral lower extremities.  No significant accessory breath sounds.  No chest pain at this time.  He was hypertensive on arrival with blood pressure 169/68.  Lab Tests: I ordered, and personally interpreted labs.  The pertinent results include: Initial troponin normal at 8 with no chest pain at time of arrival.  BMP overall unremarkable, mild hyperglycemia glucose 116.  CBC reassuring against infectious etiology, normal white blood cell count.  Mild anemia, hemoglobin 11.1.  No platelet dysfunction.  RVP negative for COVID, flu.  BNP minimally elevated at 269.   Imaging Studies: I ordered imaging studies including plain film chest x-ray. I independently visualized and interpreted imaging which showed increased pulmonary vascular markings, some atelectatic changes.  Consistent with mild CHF exacerbation, worsening pulmonary hypertension. I agree with the radiologist interpretation.   Cardiac Monitoring:  The patient was maintained on a cardiac monitor.  My attending physician Dr. Pearline Cables viewed and interpreted the cardiac monitored which showed an underlying rhythm of: questionable sinus rhythm, do not think a fib most likely, small p waves noted on my exam, PVCs in trigeminy. I agree with this interpretation.   Medications: I ordered medication including lasix  for CHF exacerbation.  Patient will need further evaluation on response to treatment during his hospitalization.  Consultations Obtained: I requested consultation with the hospitalist, spoke with Dr. Wynetta Emery,  and discussed lab and imaging findings as well as pertinent  plan - they recommend: Admission for new hypoxia in setting of pulmonary hypertension, CHF.   Disposition: After consideration of the diagnostic results and the patients response to treatment, I feel that he would benefit from admission..   I discussed this case with my attending physician Dr. Pearline Cables who cosigned this note including patient's presenting symptoms, physical exam, and planned diagnostics and interventions. Attending physician stated agreement with plan or made changes to plan which were implemented.    Final Clinical Impression(s) / ED Diagnoses Final diagnoses:  None    Rx / DC Orders ED Discharge Orders     None         Anselmo Pickler, PA-C 11/06/21 [place], DO 11/09/21 2314

## 2021-11-07 ENCOUNTER — Ambulatory Visit (HOSPITAL_COMMUNITY): Payer: No Typology Code available for payment source | Admitting: Speech Pathology

## 2021-11-07 ENCOUNTER — Inpatient Hospital Stay (HOSPITAL_COMMUNITY)
Admission: RE | Admit: 2021-11-07 | Discharge: 2021-11-07 | Disposition: A | Discharge status: Home / Self Care | Payer: No Typology Code available for payment source | Source: Ambulatory Visit | Attending: Internal Medicine | Admitting: Internal Medicine

## 2021-11-07 DIAGNOSIS — I739 Peripheral vascular disease, unspecified: Secondary | ICD-10-CM

## 2021-11-07 DIAGNOSIS — I4821 Permanent atrial fibrillation: Secondary | ICD-10-CM | POA: Diagnosis not present

## 2021-11-07 DIAGNOSIS — I5031 Acute diastolic (congestive) heart failure: Secondary | ICD-10-CM | POA: Diagnosis not present

## 2021-11-07 DIAGNOSIS — D6869 Other thrombophilia: Secondary | ICD-10-CM | POA: Diagnosis not present

## 2021-11-07 DIAGNOSIS — R1319 Other dysphagia: Secondary | ICD-10-CM | POA: Diagnosis not present

## 2021-11-07 DIAGNOSIS — R1312 Dysphagia, oropharyngeal phase: Secondary | ICD-10-CM

## 2021-11-07 LAB — CBC
HCT: 34.6 % — ABNORMAL LOW (ref 39.0–52.0)
Hemoglobin: 11.2 g/dL — ABNORMAL LOW (ref 13.0–17.0)
MCH: 29.5 pg (ref 26.0–34.0)
MCHC: 32.4 g/dL (ref 30.0–36.0)
MCV: 91.1 fL (ref 80.0–100.0)
Platelets: 258 10*3/uL (ref 150–400)
RBC: 3.8 MIL/uL — ABNORMAL LOW (ref 4.22–5.81)
RDW: 16.4 % — ABNORMAL HIGH (ref 11.5–15.5)
WBC: 8.1 10*3/uL (ref 4.0–10.5)
nRBC: 0 % (ref 0.0–0.2)

## 2021-11-07 LAB — MAGNESIUM: Magnesium: 1.7 mg/dL (ref 1.7–2.4)

## 2021-11-07 LAB — BASIC METABOLIC PANEL
Anion gap: 9 (ref 5–15)
BUN: 17 mg/dL (ref 8–23)
CO2: 27 mmol/L (ref 22–32)
Calcium: 8.6 mg/dL — ABNORMAL LOW (ref 8.9–10.3)
Chloride: 105 mmol/L (ref 98–111)
Creatinine, Ser: 0.78 mg/dL (ref 0.61–1.24)
GFR, Estimated: >60 mL/min (ref >60)
Glucose, Bld: 112 mg/dL — ABNORMAL HIGH (ref 70–99)
Potassium: 3.7 mmol/L (ref 3.5–5.1)
Sodium: 141 mmol/L (ref 135–145)

## 2021-11-07 LAB — PROTIME-INR
INR: 1.4 — ABNORMAL HIGH (ref 0.8–1.2)
Prothrombin Time: 16.7 seconds — ABNORMAL HIGH (ref 11.4–15.2)

## 2021-11-07 LAB — GLUCOSE, CAPILLARY
Glucose-Capillary: 109 mg/dL — ABNORMAL HIGH (ref 70–99)
Glucose-Capillary: 176 mg/dL — ABNORMAL HIGH (ref 70–99)
Glucose-Capillary: 105 mg/dL — ABNORMAL HIGH (ref 70–99)
Glucose-Capillary: 99 mg/dL (ref 70–99)

## 2021-11-07 LAB — BRAIN NATRIURETIC PEPTIDE: B Natriuretic Peptide: 167 pg/mL — ABNORMAL HIGH (ref 0.0–100.0)

## 2021-11-07 MED ORDER — FUROSEMIDE 10 MG/ML IJ SOLN
20.0000 mg | Freq: Two times a day (BID) | INTRAMUSCULAR | Status: DC
  Administered 2021-11-07 – 2021-11-09 (×5): 20 mg via INTRAVENOUS
  Filled 2021-11-07 (×5): qty 2

## 2021-11-07 MED ORDER — POTASSIUM CHLORIDE CRYS ER 20 MEQ PO TBCR
20.0000 meq | EXTENDED_RELEASE_TABLET | Freq: Every day | ORAL | Status: DC
  Administered 2021-11-08 – 2021-11-09 (×2): 20 meq via ORAL
  Filled 2021-11-07 (×2): qty 1

## 2021-11-07 MED ORDER — WARFARIN SODIUM 2 MG PO TABS
2.0000 mg | ORAL_TABLET | Freq: Once | ORAL | Status: AC
  Administered 2021-11-07: 2 mg via ORAL
  Filled 2021-11-07: qty 1

## 2021-11-07 MED ORDER — MAGNESIUM SULFATE 2 GM/50ML IV SOLN
2.0000 g | Freq: Once | INTRAVENOUS | Status: AC
Start: 2021-11-07 — End: 2021-11-07
  Administered 2021-11-07: 2 g via INTRAVENOUS
  Filled 2021-11-07: qty 50

## 2021-11-07 NOTE — Progress Notes (Signed)
   11/07/21 1852  ReDS Vest / Clip  Station Marker D  Ruler Value 38.5  ReDS Value Range < 36  ReDS Actual Value 28

## 2021-11-07 NOTE — Plan of Care (Signed)
  Problem: Acute Rehab PT Goals(only PT should resolve) Goal: Pt Will Go Supine/Side To Sit Flowsheets (Taken 11/07/2021 1350) Pt will go Supine/Side to Sit: with modified independence Goal: Patient Will Transfer Sit To/From Stand Flowsheets (Taken 11/07/2021 1350) Patient will transfer sit to/from stand: with modified independence Goal: Pt Will Transfer Bed To Chair/Chair To Bed Flowsheets (Taken 11/07/2021 1350) Pt will Transfer Bed to Chair/Chair to Bed: with modified independence Goal: Pt Will Ambulate Flowsheets (Taken 11/07/2021 1350) Pt will Ambulate:  100 feet  with modified independence  with rolling walker   1:51 PM, 11/07/21 Lonell Grandchild, MPT Physical Therapist with [facility] [phone] office [phone] mobile phone

## 2021-11-07 NOTE — TOC Initial Note (Signed)
Transition of Care [facility]) - Initial/Assessment Note    Patient Details  Name: Troy [name] MRN: [id number] Date of Birth: [birth year]  Transition of Care [facility] CM/SW Contact:    Iona Beard, [place] Phone Number: 11/07/2021, 12:49 PM  Clinical Narrative:                 [facility] consulted for CHF screen. CSW noted PT recommended HH PT at D/C. CSW spoke with pt who states that he is agreeable to [facility] at D/C. Pt states that he weighs himself daily and knows when to reach out to his cardiologist. Pt follows and heart healthy diet and takes all medications as they are prescribed. [facility]. [place] notification ID is [id number]. TOC to follow.    Expected Discharge Plan: [place] Barriers to Discharge: Continued Medical Work up   Patient Goals and CMS Choice Patient states their goals for this hospitalization and ongoing recovery are:: home with [facility] CMS Medicare.gov Compare Post Acute Care list provided to:: Patient Choice offered to / list presented to : Patient  Expected Discharge Plan and Services Expected Discharge Plan: [place] In-house Referral: Clinical Social Work Discharge Planning Services: CM Consult   Living arrangements for the past 2 months: [place]                                      Prior Living Arrangements/Services Living arrangements for the past 2 months: Single Family Home Lives with:: Self Patient language and need for interpreter reviewed:: Yes Do you feel safe going back to the place where you live?: Yes      Need for Family Participation in Patient Care: Yes (Comment) Care giver support system in place?: Yes (comment) Current home services: Home PT Criminal Activity/Legal Involvement Pertinent to Current Situation/Hospitalization: No - Comment as needed  Activities of Daily Living Home Assistive Devices/Equipment: Eyeglasses, Dentures (specify type), Cane (specify  quad or straight), CBG Meter, Hearing aid, Grab bars in shower, Oxygen, Nebulizer, Walker (specify type) ADL Screening (condition at time of admission) Patient's cognitive ability adequate to safely complete daily activities?: Yes Is the patient deaf or have difficulty hearing?: Yes Does the patient have difficulty seeing, even when wearing glasses/contacts?: No Does the patient have difficulty concentrating, remembering, or making decisions?: Yes Patient able to express need for assistance with ADLs?: Yes Does the patient have difficulty dressing or bathing?: No Independently performs ADLs?: Yes (appropriate for developmental age) Does the patient have difficulty walking or climbing stairs?: Yes Weakness of Legs: Both Weakness of Arms/Hands: Both  Permission Sought/Granted                  Emotional Assessment Appearance:: Appears stated age Attitude/Demeanor/Rapport: Engaged Affect (typically observed): Accepting Orientation: : Oriented to Self, Oriented to Place, Oriented to  Time, Oriented to Situation Alcohol / Substance Use: Not Applicable Psych Involvement: No (comment)  Admission diagnosis:  Acute heart failure with preserved ejection fraction (HFpEF) ([place]) [I50.31] Patient Active Problem List   Diagnosis Date Noted   Acute heart failure with preserved ejection fraction (HFpEF) ([place]) 11/06/2021   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   Neck pain    Dizziness    Carotid artery disease ([place])    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 - [place], [place] [address] Phone: [phone] Fax: [phone]     Social Determinants of Health (SDOH) Interventions    Readmission Risk Interventions    11/07/2021   12:48 PM  Readmission Risk Prevention Plan  Transportation Screening Complete  Home Care Screening Complete  Medication Review (RN CM) Complete

## 2021-11-07 NOTE — Evaluation (Signed)
Clinical/Bedside Swallow Evaluation Patient Details  Name: Troy [name] MRN: [id number] Date of Birth: 1941/01/30  Today's Date: 11/07/2021 Time: SLP Start Time (ACUTE ONLY): 0856 SLP Stop Time (ACUTE ONLY): 0919 SLP Time Calculation (min) (ACUTE ONLY): 23 min  Past Medical History:  Past Medical History:  Diagnosis Date   AAA (abdominal aortic aneurysm) ([place])    Followed by Dr. Sherren Mocha Early   Arthritis    CAD (coronary artery disease)    a. CABG 2000.   Cancer [facility])    Prostate:  Radiation Tx   Carotid artery disease ([place])    Chest pain    precordial. mild chronic .[name]... nonischemic   CHF (congestive heart failure) (HCC)    Chronic edema    Coronary artery disease    a. Nuclear, January, 2008, no ischemia b. Cath 08/2012- 1/4 patent grafts, RCA CTO, no flow-limiting disease, medically managed   Diabetes mellitus without complication ([place])    Dizziness 02/2011   Dyslipidemia    Fall    GERD (gastroesophageal reflux disease)    TAKES TUMS & ROLAIDS AS NEEDED   Hx of CABG 2000   Hypertension    Myocardial infarction ([place]) 1999   Neck pain 02/2011   Pneumonia    Pulmonary hypertension ([place])    Renal artery stenosis (HCC)    50-70%   S/P femoropopliteal bypass surgery    Dr. Donnetta Hutching   Sinus bradycardia    Asymptomatic   Past Surgical History:  Past Surgical History:  Procedure Laterality Date   CARDIAC CATHETERIZATION  09/26/2012   1/4 patent bypass (occluded SVG-PDA, SVG-OM, LIMA-LAD), SVG-diagonal patent and fills the diagonal and LAD, distal RCA occlusion with left to right collateralization, patent circumflex, LAD with no flow-limiting disease and antegrade flow competitively from SVG-diagonal; EF 60-65%   COLONOSCOPY  11/30/2009   OXB:[id number]. next TCS 11/2019   COLONOSCOPY WITH PROPOFOL N/A 12/18/2019   Procedure: COLONOSCOPY WITH PROPOFOL;  Surgeon: Harvel Quale, MD;  Location: [facility] ENDO SUITE;  Service: Gastroenterology;  Laterality: N/A;  1030    CORONARY ARTERY BYPASS GRAFT  2000   CYSTOSCOPY N/A 08/31/2021   Procedure: CYSTOSCOPY;  Surgeon: Cleon Gustin, MD;  Location: [facility] ORS;  Service: Urology;  Laterality: N/A;  pt knows to arrive at 7:00   ESOPHAGOGASTRODUODENOSCOPY N/A 08/12/2013   DQQ:[id number] peptic stricture with erosive refluxesophagitis - status post Maloney dilation. Hiatal hernia. Abnormalgastric mucosa. Deformity of the pyloric channel suggestive ofprior peptic ulcer disease. Duodenal bulbar diverticulum Statuspost gastric biopsy. h.pylori   LEFT HEART CATHETERIZATION WITH CORONARY/GRAFT ANGIOGRAM N/A 09/26/2012   Procedure: LEFT HEART CATHETERIZATION WITH Beatrix Fetters;  Surgeon: Burnell Blanks, MD;  Location: [facility] CATH LAB;  Service: Cardiovascular;  Laterality: N/A;   MALONEY DILATION N/A 08/12/2013   Procedure: Venia Minks DILATION;  Surgeon: Daneil Dolin, MD;  Location: [facility] ENDO SU[facility];  Service: Endoscopy;  Laterality: N/A;   POLYPECTOMY  12/18/2019   Procedure: POLYPECTOMY;  Surgeon: Harvel Quale, MD;  Location: [facility] END[facility] SU[facility];  Service: Gastroenterology;;  ascending colon polyp    PR VEIN BYPASS GRAFT,AORTO-FEM-POP Right 07/19/1999   PR VEIN BYPASS GRAFT,AORTO-FEM-POP Left 05/02/2006   RIGHT HEART CATH AND CORONARY/GRAFT ANGIOGRAPHY N/A 04/11/2020   Procedure: RIGHT HEART CATH AND CORONARY/GRAFT ANGIOGRAPHY;  Surgeon: Larey Dresser, MD;  Location: [place] CV LAB;  Service: Cardiovascular;  Laterality: N/A;   SAVORY DILATION N/A 08/12/2013   Procedure: SAVORY DILATION;  Surgeon: Daneil Dolin, MD;  Location: [facility];  Service: Endoscopy;  Laterality: N/A;   TRANSURETHRAL RESECTION OF BLADDER TUMOR N/A 08/31/2021   Procedure: TRANSURETHRAL RESECTION OF BLADDER TUMOR (TURBT);  Surgeon: Cleon Gustin, MD;  Location: [facility] ORS;  Service: Urology;  Laterality: N/A;   WRIST SURGERY     cyst removal   HPI: MARQUE RADEMAKER is a 81 year old gentleman with a complex past medical  history including coronary artery disease, heart failure preserved EF, severe pulmonary hypertension, type 2 diabetes mellitus, renal artery stenosis, hyperlipidemia, essential hypertension, anticoagulated with warfarin, esophageal dysphagia, peripheral vascular disease, atrial fibrillation, 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 Pt with hx of dysphonia, RVF immobility, oropharyngeal dysphagia, esophageal dysphagia and UES dysfunction. Pt has been treated at [facility] by GI and SLP teams there. Most Recent MBSS was completed 12/2019 with recommendation for regular/thin and R head turn to facilitate clearance of pharyngeal residue.    Assessment / Plan / Recommendation  Clinical Impression  Clinical swallowing evaluation completed while Pt was sitting upright in on the side of bed; Pt had just finished his breakfast as SLP entered the room. Pt consumed regular textures and thin liquids with reports of globus sensation and noted delayed wet vocal quality and delayed throat clearing. Pt has been followed by [facility] for dysphonia, R VF immobility, oropharyngeal dysphagia and UES dysfunction. Further note hx of esophageal dysphagia and dilation. Further details of hx above in H&P. Pt was scheduled for OP MBSS today prior to being admitted yesterday, 11/06/21. Recommend proceed with instrumental assessment today as inpatient in order to objectively assess the swallowing function. As Pt is experiencing and reporting esophageal s/sx further recommend consider GI consult. SLP Visit Diagnosis: Dysphagia, unspecified (R13.10)    Aspiration Risk  Mild aspiration risk    Diet Recommendation Regular;Thin liquid   Liquid Administration via: Cup;Straw Medication Administration: Whole meds with liquid Supervision: Patient able to self feed Compensations: Minimize environmental  distractions;Slow rate;Multiple dry swallows after each bite/sip;Follow solids with liquid (head turn was effective on previous MBS for clearance of residue, will confirm after MBS if this is still recommended)    Other  Recommendations Recommended Consults: Consider GI evaluation Oral Care Recommendations: Oral care BID    Recommendations for follow up therapy are one component of a multi-disciplinary discharge planning process, led by the attending physician.  Recommendations may be updated based on patient status, additional functional criteria and insurance authorization.  Follow up Recommendations        Assistance Recommended at Discharge    Functional Status Assessment    Frequency and Duration min 2x/week  1 week       Prognosis Prognosis for Safe Diet Advancement: Good      Swallow Study   General Type of Study: Bedside Swallow Evaluation Previous Swallow Assessment: Most recent MBS completed at [facility] 12/2019 Diet Prior to this Study: Regular;Thin liquids Temperature Spikes Noted: No Respiratory Status: Nasal cannula History of Recent Intubation: No Behavior/Cognition: Alert;Cooperative;Pleasant mood Oral Cavity Assessment: Within Functional Limits Oral Care Completed by SLP: Recent completion by staff Oral Cavity - Dentition: Adequate natural dentition Vision: Functional for self-feeding Self-Feeding Abilities: Able to feed self Patient Positioning: Upright in bed Baseline Vocal Quality: Normal Volitional Cough: Strong;Wet Volitional Swallow: Able to elicit    Oral/Motor/Sensory Function Overall Oral Motor/Sensory Function: Within functional limits   Ice Chips Ice chips: Within functional limits   Thin Liquid Thin Liquid:  Impaired Pharyngeal  Phase Impairments: Multiple swallows;Wet Vocal Quality;Throat Clearing - Delayed    Nectar Thick Nectar Thick Liquid: Not tested   Honey Thick Honey Thick Liquid: Not tested   Puree Puree: Within functional  limits   Solid     Solid: Impaired Presentation: Spoon Pharyngeal Phase Impairments: Throat Clearing - Delayed;Multiple swallows;Wet Vocal Quality     Lulani Bour H. Roddie Mc, CCC-SLP Speech Language Pathologist  Wende Bushy 11/07/2021,9:43 AM

## 2021-11-07 NOTE — Progress Notes (Signed)
ANTICOAGULATION CONSULT NOTE -   Pharmacy Consult for warfarin Indication: atrial fibrillation  Allergies  Allergen Reactions   Niacin Itching and Rash    Burning sensation   Contrast Media [Iodinated Contrast Media] Nausea And Vomiting   Iodine-131 Nausea And Vomiting   Nsaids Other (See Comments)    Taking Coumadin    Patient Measurements: Height: '5\' 10"'$  (177.8 cm) Weight: 89.4 kg (197 lb 1.5 oz) IBW/kg (Calculated) : 73  Vital Signs: Temp: 98.5 F (36.9 C) (08/08 0448) BP: 115/57 (08/08 0448) Pulse Rate: 57 (08/08 0448)  Labs: Recent Labs    11/06/21 1220 11/07/21 0410  HGB 11.1* 11.2*  HCT 34.9* 34.6*  PLT 219 258  LABPROT 16.2* 16.7*  INR 1.3* 1.4*  CREATININE 0.54* 0.78  TROPONINIHS 8  --      Estimated Creatinine Clearance: 85.7 mL/min (by C-G formula based on SCr of 0.78 mg/dL).   Medical History: Past Medical History:  Diagnosis Date   AAA (abdominal aortic aneurysm) ([place])    Followed by Dr. Sherren Mocha Early   Arthritis    CAD (coronary artery disease)    a. CABG 2000.   Cancer [facility])    Prostate:  Radiation Tx   Carotid artery disease ([place])    Chest pain    precordial. mild chronic .[name]... nonischemic   CHF (congestive heart failure) (HCC)    Chronic edema    Coronary artery disease    a. Nuclear, January, 2008, no ischemia b. Cath 08/2012- 1/4 patent grafts, RCA CTO, no flow-limiting disease, medically managed   Diabetes mellitus without complication ([place])    Dizziness 02/2011   Dyslipidemia    Fall    GERD (gastroesophageal reflux disease)    TAKES TUMS & ROLAIDS AS NEEDED   Hx of CABG 2000   Hypertension    Myocardial infarction ([place]) 1999   Neck pain 02/2011   Pneumonia    Pulmonary hypertension ([place])    Renal artery stenosis (HCC)    50-70%   S/P femoropopliteal bypass surgery    Dr. Donnetta Hutching   Sinus bradycardia    Asymptomatic    Medications:  Medications Prior to Admission  Medication Sig Dispense Refill Last Dose   acetaminophen  (TYLENOL) 325 MG tablet Take 2 tablets (650 mg total) by mouth every 6 (six) hours as needed for mild pain, fever or headache (or Fever >/= 101). 12 tablet 0 unknown   albuterol (VENTOLIN HFA) 108 (90 Base) MCG/ACT inhaler Inhale 2 puffs into the lungs every 6 (six) hours as needed for wheezing or shortness of breath. 1 each 2 11/05/2021   amLODipine (NORVASC) 10 MG tablet Take 10 mg by mouth daily.   11/05/2021   aspirin 81 MG EC tablet Take 1 tablet (81 mg total) by mouth daily with breakfast. 30 tablet 3 11/05/2021   atorvastatin (LIPITOR) 40 MG tablet Take 1 tablet (40 mg total) by mouth daily. 90 tablet 1 11/05/2021   famotidine (PEPCID) 20 MG tablet One after supper 30 tablet 11 11/05/2021   HYDROcodone-acetaminophen (NORCO/VICODIN) 5-325 MG tablet Take 1 tablet by mouth daily as needed for moderate pain. 30 tablet 0 Past Week   ipratropium (ATROVENT) 0.03 % nasal spray Place 2 sprays into both nostrils as needed for rhinitis.   unknown   ipratropium-albuterol (DUONEB) 0.5-2.5 (3) MG/3ML SOLN Take 3 mLs by nebulization every 4 (four) hours as needed. 360 mL 1 unknown   isosorbide mononitrate (IMDUR) 60 MG 24 hr tablet Take 2 tablets (120 mg total)  by mouth every evening. 90 tablet 6 11/05/2021   losartan (COZAAR) 25 MG tablet Take 1 tablet (25 mg total) by mouth daily. 30 tablet 4 11/05/2021   metFORMIN (GLUCOPHAGE) 1000 MG tablet Take 500 mg by mouth 2 (two) times daily with a meal.   11/05/2021   metoprolol succinate (TOPROL-XL) 25 MG 24 hr tablet Take 1 tablet (25 mg total) by mouth daily. Pt. Needs to make an appt. With Cardiologist in order to receive further refills. Thank You. 1st Attempt. 30 tablet 0 11/05/2021 at PM   mirabegron ER (MYRBETRIQ) 25 MG TB24 tablet Take 1 tablet (25 mg total) by mouth daily. 30 tablet 0 11/05/2021   nitroGLYCERIN (NITROSTAT) 0.4 MG SL tablet Place 1 tablet (0.4 mg total) under the tongue every 5 (five) minutes x 3 doses as needed for chest pain. 25 tablet 3 unknown   OXYGEN  Inhale 3 L into the lungs at bedtime.   11/05/2021   pantoprazole (PROTONIX) 40 MG tablet Take 1 tablet (40 mg total) by mouth daily. Take 30-60 min before first meal of the day   11/05/2021   potassium chloride (KLOR-CON) 10 MEQ tablet Take 1 tablet (10 mEq total) by mouth daily. Only take while taking Torsemide 30 tablet 3 11/05/2021   tamsulosin (FLOMAX) 0.4 MG CAPS capsule Take 1 capsule (0.4 mg total) by mouth daily after supper. 30 capsule 11 11/05/2021   torsemide (DEMADEX) 10 MG tablet Take 1 tablet (10 mg total) by mouth every other day. Take 20 mg for weight gain over 3 lbs in 24 hours 30 tablet 3 Past Week   warfarin (COUMADIN) 1 MG tablet Take 1 mg by mouth every evening. Take 2 mg  every other day and 1 mg all others   11/05/2021 at 1900    Assessment: Pharmacy consulted to dose warfarin in patient with atrial fibrillation. Home dose listed as alternating doses of 1 mg and 2 mg. Last dose of 2 mg on 8/6.  CBC WNL INR 1.3> 1.4  Will give another '2mg'$  today since subtherapeutic  Goal of Therapy:  INR 2-3 Monitor platelets by anticoagulation protocol: Yes   Plan:  Warfarin 2 mg x 1 dose. Monitor daily INR and s/s of bleeding.  Isac Sarna, BS Pharm D, BCPS Clinical Pharmacist 11/07/2021 8:45 AM

## 2021-11-07 NOTE — Progress Notes (Signed)
PROGRESS NOTE   Troy FRIZELL  OVZ:[id number] DOB: 07-21-1940 DOA: 11/06/2021 PCP: Sharilyn Sites, MD   Chief Complaint  Patient presents with   Shortness of Breath        Level of care: Telemetry  Brief Admission History:  81 year old gentleman with a complex past medical history including coronary artery disease, heart failure preserved EF, severe pulmonary hypertension, type 2 diabetes mellitus, renal artery stenosis, hyperlipidemia, essential hypertension, anticoagulated with warfarin, esophageal dysphagia, peripheral vascular disease, atrial fibrillation, nocturnal hypoxemia on night oxygen who was just recently discharged from the [place] in [place] after being hospitalized for dizziness and dehydration.  He had an episode of gross hematuria and recently followed up with urology Dr. [name] where he had cystoscopy performed.  His gross hematuria had resolved.  He was brought into the emergency department by [facility] for increasing shortness of breath for 1 day.  He says that he had normally uses oxygen only at night but yesterday started wearing it at all times and became shortness of breath with rest.  He denied having any chest pain.  He has had whitish sputum production and reported feeling overall weak.  His workup in the ED revealed an elevated cardiac BNP and chest x-ray with findings of congestive heart failure.  He was started on IV Lasix and admission was requested for further management.   Assessment and Plan:  Acute HFpEF - pt presents with symptomatic volume overload as evidenced on chest x-ray, physical exam and elevated BNP -Patient reports compliance with home diuretics however recently hospitalized for dehydration and likely treated with IV fluid hydration -Will be diuresed with IV Lasix, reduced dose to 20 mg BID -Monitor intake/output, daily weight -2 g sodium diet and fluid restriction - follow up BNP, now trending down - ordered ReDs Clip reading on  admission and follow  Filed Weights   11/06/21 1140 11/07/21 0500  Weight: 90.7 kg 89.4 kg    Intake/Output Summary (Last 24 hours) at 11/07/2021 1053 Last data filed at 11/07/2021 0900 Gross per 24 hour  Intake 820 ml  Output 3250 ml  Net -2430 ml     Severe Pulmonary Hypertension  - Pt is being followed by advanced HF clinic - Will need outpatient follow up after discharge    Atrial Fibrillation/acquired thrombophilia  - HR well controlled, pt is anticoagulated with warfarin -Pharmacy consult for warfarin management in hospital -Following daily PT/INR   Hyperlipidemia - resumed home medication   Type 2 diabetes mellitus, controlled with vascular complications  -SSI coverage and frequent CBG monitoring in addition to prandial NovoLog ordered - further adjustments pending CBG readings  CBG (last 3)  Recent Labs    11/06/21 1724 11/06/21 2122 11/07/21 0718  GLUCAP 136* 138* 109*   Esophageal dysphagia  -Patient reports that he is scheduled to have a barium swallow evaluation at [facility] on 8/8.  -Consulted to the SLP to see if he can have the study done while he is inpatient   CAD s/p CABG at age 9 - he is being medically managed now and we have resumed his cardiac medications   Acute on chronic respiratory distress - exacerbated by acute heart failure  - continuous supplemental oxygen ordered   Essential hypertension  - resuming home medication and follow closely    PVD - severe  - he has had multiple bypass surgeries - resume medical management    DVT prophylaxis: warfarin/TED hose  Code Status: Full   Family Communication:  Disposition Plan: anticipating [facility] with [facility]   Consults called:  pharmacy Admission status: INP  Disposition: Status is: Inpatient Remains inpatient appropriate because: IV lasix required for diuresis   Consultants:  SLP pharmacy Procedures:  MBS pending 8/8 Antimicrobials:    Subjective: Still having dry cough, chest  congestion, occasional SOB.   Objective: Vitals:   11/06/21 1615 11/06/21 2110 11/07/21 0448 11/07/21 0500  BP: (!) 159/84 (!) 115/57 (!) 115/57   Pulse: 84 60 (!) 57   Resp: (!) '21 18 16   '$ Temp: 98.1 F (36.7 C) 97.6 F (36.4 C) 98.5 F (36.9 C)   TempSrc:      SpO2: 96% 94% 96%   Weight:    89.4 kg  Height:        Intake/Output Summary (Last 24 hours) at 11/07/2021 1052 Last data filed at 11/07/2021 0900 Gross per 24 hour  Intake 820 ml  Output 3250 ml  Net -2430 ml   Filed Weights   11/06/21 1140 11/07/21 0500  Weight: 90.7 kg 89.4 kg   Examination:  General exam: Appears calm and comfortable  Respiratory system: improved crackles at bases. Respiratory effort normal. Cardiovascular system: normal S1 & S2 heard. No JVD, murmurs, rubs, gallops or clicks. 1+ pedal edema. Gastrointestinal system: Abdomen is nondistended, soft and nontender. No organomegaly or masses felt. Normal bowel sounds heard. Central nervous system: Alert and oriented. No focal neurological deficits. Extremities: Symmetric 5 x 5 power. Skin: No rashes, lesions or ulcers. Psychiatry: Judgement and insight appear normal. Mood & affect appropriate.   Data Reviewed: I have personally reviewed following labs and imaging studies  CBC: Recent Labs  Lab 11/06/21 1220 11/07/21 0410  WBC 9.4 8.1  NEUTROABS 7.3  --   HGB 11.1* 11.2*  HCT 34.9* 34.6*  MCV 89.5 91.1  PLT 219 626    Basic Metabolic Panel: Recent Labs  Lab 11/06/21 1220 11/07/21 0410  NA 136 141  K 3.5 3.7  CL 106 105  CO2 25 27  GLUCOSE 116* 112*  BUN 11 17  CREATININE 0.54* 0.78  CALCIUM 8.7* 8.6*  MG  --  1.7    CBG: Recent Labs  Lab 11/06/21 1625 11/06/21 1724 11/06/21 2122 11/07/21 0718  GLUCAP 108* 136* 138* 109*    Recent Results (from the past 240 hour(s))  Resp Panel by RT-PCR (Flu A&B, Covid) Anterior Nasal Swab     Status: None   Collection Time: 11/06/21 12:13 PM   Specimen: Anterior Nasal Swab   Result Value Ref Range Status   SARS Coronavirus 2 by RT PCR NEGATIVE NEGATIVE Final    Comment: (NOTE) SARS-CoV-2 target nucleic acids are NOT DETECTED.  The SARS-CoV-2 RNA is generally detectable in upper respiratory specimens during the acute phase of infection. The lowest concentration of SARS-CoV-2 viral copies this assay can detect is 138 copies/mL. A negative result does not preclude SARS-Cov-2 infection and should not be used as the sole basis for treatment or other patient management decisions. A negative result may occur with  improper specimen collection/handling, submission of specimen other than nasopharyngeal swab, presence of viral mutation(s) within the areas targeted by this assay, and inadequate number of viral copies(<138 copies/mL). A negative result must be combined with clinical observations, patient history, and epidemiological information. The expected result is Negative.  Fact Sheet for Patients:  EntrepreneurPulse.com.au  Fact Sheet for Healthcare Providers:  IncredibleEmployment.be  This test is no t yet approved or cleared by the [place] and  has been authorized for detection and/or diagnosis of SARS-CoV-2 by FDA under an Emergency Use Authorization (EUA). This EUA will remain  in effect (meaning this test can be used) for the duration of the COVID-19 declaration under Section 564(b)(1) of the Act, 21 U.S.C.section 360bbb-3(b)(1), unless the authorization is terminated  or revoked sooner.       Influenza A by PCR NEGATIVE NEGATIVE Final   Influenza B by PCR NEGATIVE NEGATIVE Final    Comment: (NOTE) The Xpert Xpress SARS-CoV-2/FLU/RSV plus assay is intended as an aid in the diagnosis of influenza from Nasopharyngeal swab specimens and should not be used as a sole basis for treatment. Nasal washings and aspirates are unacceptable for Xpert Xpress SARS-CoV-2/FLU/RSV testing.  Fact Sheet for  Patients: EntrepreneurPulse.com.au  Fact Sheet for Healthcare Providers: IncredibleEmployment.be  This test is not yet approved or cleared by the [place] FDA and has been authorized for detection and/or diagnosis of SARS-CoV-2 by FDA under an Emergency Use Authorization (EUA). This EUA will remain in effect (meaning this test can be used) for the duration of the COVID-19 declaration under Section 564(b)(1) of the Act, 21 U.S.C. section 360bbb-3(b)(1), unless the authorization is terminated or revoked.  Performed at [facility], [address], [place], [place] [address]      Radiology Studies: DG Chest 2 View  Result Date: 11/06/2021 CLINICAL DATA:  Shortness of breath. Cough. Hypertension. Ex-smoker. Prostate cancer. EXAM: CHEST - 2 VIEW COMPARISON:  03/02/2021 CT.  10/05/2020 plain film. FINDINGS: Numerous leads and wires project over the chest. Midline trachea. Mild cardiomegaly. Atherosclerosis in the transverse aorta. Tiny bilateral pleural effusions are most apparent on the lateral view. No pneumothorax. Interstitial edema is mild. Patchy bibasilar airspace disease. IMPRESSION: Mild cardiomegaly with congestive heart failure and small bilateral pleural effusions. Patchy bibasilar airspace disease, favoring atelectasis. Aortic Atherosclerosis (ICD10-I70.0). Electronically Signed   By: Abigail Miyamoto M.D.   On: 11/06/2021 12:39    Scheduled Meds:  amLODipine  10 mg Oral Daily   aspirin EC  81 mg Oral Q breakfast   atorvastatin  40 mg Oral QPM   furosemide  20 mg Intravenous BID   insulin aspart  0-15 Units Subcutaneous TID WC   insulin aspart  3 Units Subcutaneous TID WC   isosorbide mononitrate  120 mg Oral QPM   losartan  25 mg Oral Daily   metoprolol succinate  25 mg Oral Daily   mirabegron ER  25 mg Oral Daily   pantoprazole  40 mg Oral Daily   potassium chloride  20 mEq Oral BID   tamsulosin  0.4 mg Oral QPC supper   warfarin  2 mg  Oral ONCE-1600   Warfarin - Pharmacist Dosing Inpatient   Does not apply q1600   Continuous Infusions:   LOS: 1 day   Time spent: 36 mins  Nevea Spiewak Wynetta Emery, MD How to contact the [facility] Attending or Consulting provider [place] or covering provider during after hours [place], for this patient?  Check the care team in [facility] and look for a) attending/consulting [facility] provider listed and b) the [facility] team listed Log into [email or web address] and use [place]'s universal password to access. If you do not have the password, please contact the hospital operator. Locate the [facility] provider you are looking for under [facility] and page to a number that you can be directly reached. If you still have difficulty reaching the provider, please page the [facility] (Director on Call) for the Hospitalists listed on  amion for assistance.  11/07/2021, 10:52 AM

## 2021-11-07 NOTE — Evaluation (Signed)
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  He had an episode of gross hematuria and recently followed up with urology Dr. [name] where he had cystoscopy performed.  His gross hematuria had resolved.     He was brought into the emergency department by [facility] for increasing shortness of breath for 1 day.  He says that he had normally uses oxygen only at night but yesterday started wearing it at all times and became shortness of breath with rest.  He denied having any chest pain.  He has had whitish sputum production and reported feeling overall weak.   Clinical Impression  Patient functioning near baseline for functional mobility and gait demonstrating labored movement for sitting up at bedside with HOB flat, good return for ambulation in room/hallway without loss of balance while on room air with SpO2 at 92%.  Patient tolerated sitting up in wheelchair to be taken to a procedure after therapy - nursing staff aware.  Patient will benefit from continued skilled physical therapy in hospital and recommended venue below to increase strength, balance, endurance for safe ADLs and gait.         Recommendations for follow up therapy are one component of a multi-disciplinary discharge planning process, led by the attending physician.  Recommendations may be updated based on patient  status, additional functional criteria and insurance authorization.  Follow Up Recommendations Home health PT      Assistance Recommended at Discharge PRN  Patient can return home with the following  A little help with walking and/or transfers;A little help with bathing/dressing/bathroom;Help with stairs or ramp for entrance;Assistance with cooking/housework    Equipment Recommendations None recommended by PT  Recommendations for Other Services       Functional Status Assessment Patient has had a recent decline in their functional status and demonstrates the ability to make significant improvements in function in a reasonable and predictable amount of time.     Precautions / Restrictions Precautions Precautions: Fall Restrictions Weight Bearing Restrictions: No      Mobility  Bed Mobility Overal bed mobility: Needs Assistance Bed Mobility: Supine to Sit     Supine to sit: Supervision     General bed mobility comments: increased time, required use of bed rail for completing supine to sitting with HOB flat    Transfers Overall transfer level: Needs assistance Equipment used: Rolling walker (2 wheels) Transfers: Sit to/from Stand, Bed to chair/wheelchair/BSC Sit to Stand: Supervision   Step pivot transfers: Supervision       General transfer comment: slightly labored movement    Ambulation/Gait Ambulation/Gait assistance: Supervision Gait Distance (Feet): 75 Feet Assistive device: Rolling walker (2 wheels) Gait Pattern/deviations: Decreased step length - right, Decreased step length - left, Decreased stride length Gait velocity: decreased     General Gait Details: slow slightly labored cadence without loss of balance, on room air with SpO2 at 92%  Stairs            Wheelchair Mobility    Modified Rankin (Stroke Patients Only)  Balance Overall balance assessment: Needs assistance Sitting-balance support: Feet supported, No upper extremity  supported Sitting balance-Leahy Scale: Good Sitting balance - Comments: seated at EOB   Standing balance support: During functional activity, Bilateral upper extremity supported Standing balance-Leahy Scale: Fair Standing balance comment: fair/good using RW                             Pertinent Vitals/Pain Pain Assessment Pain Assessment: 0-10 Pain Score: 1  Pain Location: low back Pain Descriptors / Indicators: Aching Pain Intervention(s): Limited activity within patient's tolerance, Monitored during session    Home Living Family/patient expects to be discharged to:: Private residence Living Arrangements: Alone Available Help at Discharge: Family;Available PRN/intermittently Type of Home: House Home Access: Ramped entrance       Home Layout: One level Home Equipment: Conservation officer, nature (2 wheels);Wheelchair - manual;Shower seat - built in;Shower seat;Grab bars - tub/shower      Prior Function Prior Level of Function : Independent/Modified Independent             Mobility Comments: Household and  short distanced community ambulator using RW, drives ADLs Comments: Independent     Hand Dominance        Extremity/Trunk Assessment   Upper Extremity Assessment Upper Extremity Assessment: Overall WFL for tasks assessed    Lower Extremity Assessment Lower Extremity Assessment: Generalized weakness    Cervical / Trunk Assessment Cervical / Trunk Assessment: Normal  Communication   Communication: No difficulties  Cognition Arousal/Alertness: Awake/alert Behavior During Therapy: WFL for tasks assessed/performed Overall Cognitive Status: Within Functional Limits for tasks assessed                                          General Comments      Exercises     Assessment/Plan    PT Assessment Patient needs continued PT services  PT Problem List Decreased strength;Decreased activity tolerance;Decreased balance;Decreased mobility        PT Treatment Interventions DME instruction;Gait training;Stair training;Functional mobility training;Therapeutic activities;Therapeutic exercise;Balance training    PT Goals (Current goals can be found in the Care Plan section)  Acute Rehab PT Goals Patient Stated Goal: return home with family to assist PT Goal Formulation: With patient Time For Goal Achievement: 11/09/21 Potential to Achieve Goals: Good    Frequency Min 3X/week     Co-evaluation               AM-PAC PT "6 Clicks" Mobility  Outcome Measure Help needed turning from your back to your side while in a flat bed without using bedrails?: None Help needed moving from lying on your back to sitting on the side of a flat bed without using bedrails?: A Little Help needed moving to and from a bed to a chair (including a wheelchair)?: None Help needed standing up from a chair using your arms (e.g., wheelchair or bedside chair)?: A Little Help needed to walk in hospital room?: A Little Help needed climbing 3-5 steps with a railing? : A Little 6 Click Score: 20    End of Session Equipment Utilized During Treatment: Oxygen Activity Tolerance: Patient limited by fatigue;Patient tolerated treatment well Patient left: Other (comment) (left in wheelchair to be taken to a procedure by nursing staff) Nurse Communication: Mobility status PT Visit Diagnosis: Unsteadiness on feet (R26.81);Other abnormalities of gait and mobility (R26.89);Muscle  weakness (generalized) (M62.81)    Time: 8118-8677 PT Time Calculation (min) (ACUTE ONLY): 21 min   Charges:   PT Evaluation $PT Eval Moderate Complexity: 1 Mod PT Treatments $Therapeutic Activity: 8-22 mins        1:49 PM, 11/07/21 Lonell Grandchild, MPT Physical Therapist with [facility] [phone] office [phone] mobile phone

## 2021-11-07 NOTE — Evaluation (Signed)
Modified Barium Swallow Progress Note  Patient Details  Name: Troy [name] MRN: [id number] Date of Birth: 14-Dec-1940  Today's Date: 11/07/2021  Modified Barium Swallow completed.  Full report located under Chart Review in the Imaging Section.  Brief recommendations include the following:  Clinical Impression  Patient presents with stable, moderate sensorimotor oropharyngeal dysphagia. Compared to previous MBSS completed 04/21/19 (reviewed in care everywhere), PT's presentation is largely unchanged. Continue to note reduced bolus propulsion and UES distention resulting in moderate-severe, diffuse post-swallow bolus residual in the pharynx. Continue to noted that head turn to the right with slight chin tuck is most effective in improving pharyngeal clearance. Deficits also result in laryngeal penetration of thin liquid after the swallow. Trace aspiraiton of thin liquid X1 with initial tsp sip of thin liquids. Stasis of barium tablet was observed at the level of the UES which required multiple liquid washes. Pt does report sensate to varying amounts of pharyngeal residue. Based on today's evaluation, recommend patient continue regular solid diet with thin liquids and strict adherence to aspiration precautions and swallowing strategies below. Continue to recommend GI consult and possible need for repeat esophageal dilation and/or treatment for esophageal dysphagia at physician discretion as indicated. ST will follow acutely to reinforce strategies and ensure diet tolerance.   Education: Pt was educated at to results and recommendations; he was educated as to risks and adverse outcomes associated with dysphagia and aspiration (i.e. possible life threatening PNA). Reviewed rationale for adherence to recommendations. Pt verbalized understanding.   Swallow Evaluation Recommendations   Recommended Consults: Consider GI evaluation;Consider esophageal assessment   SLP Diet Recommendations: Regular  solids;Thin liquid   Liquid Administration via: Cup;Straw   Medication Administration: Whole meds with liquid   Supervision: Patient able to self feed;Intermittent supervision to cue for compensatory strategies   Compensations: Minimize environmental distractions;Slow rate;Multiple dry swallows after each bite/sip;Follow solids with liquid;Chin tuck (Head turn RIGHT)   Postural Changes: Seated upright at 90 degrees;Remain semi-upright after after feeds/meals (Comment)   Oral Care Recommendations: Oral care BID       [place] Sheriff H. Roddie Mc, CCC-SLP Speech Language Pathologist  Wende Bushy 11/07/2021,11:40 AM

## 2021-11-08 DIAGNOSIS — G4734 Idiopathic sleep related nonobstructive alveolar hypoventilation: Secondary | ICD-10-CM | POA: Diagnosis not present

## 2021-11-08 DIAGNOSIS — R1319 Other dysphagia: Secondary | ICD-10-CM | POA: Diagnosis not present

## 2021-11-08 DIAGNOSIS — I4821 Permanent atrial fibrillation: Secondary | ICD-10-CM | POA: Diagnosis not present

## 2021-11-08 DIAGNOSIS — I5031 Acute diastolic (congestive) heart failure: Secondary | ICD-10-CM | POA: Diagnosis not present

## 2021-11-08 DIAGNOSIS — I5033 Acute on chronic diastolic (congestive) heart failure: Secondary | ICD-10-CM

## 2021-11-08 LAB — BASIC METABOLIC PANEL
Anion gap: 6 (ref 5–15)
BUN: 17 mg/dL (ref 8–23)
CO2: 26 mmol/L (ref 22–32)
Calcium: 8.5 mg/dL — ABNORMAL LOW (ref 8.9–10.3)
Chloride: 108 mmol/L (ref 98–111)
Creatinine, Ser: 0.66 mg/dL (ref 0.61–1.24)
GFR, Estimated: >60 mL/min (ref >60)
Glucose, Bld: 109 mg/dL — ABNORMAL HIGH (ref 70–99)
Potassium: 3.6 mmol/L (ref 3.5–5.1)
Sodium: 140 mmol/L (ref 135–145)

## 2021-11-08 LAB — CBC
HCT: 35.5 % — ABNORMAL LOW (ref 39.0–52.0)
Hemoglobin: 11.3 g/dL — ABNORMAL LOW (ref 13.0–17.0)
MCH: 28.7 pg (ref 26.0–34.0)
MCHC: 31.8 g/dL (ref 30.0–36.0)
MCV: 90.1 fL (ref 80.0–100.0)
Platelets: 227 10*3/uL (ref 150–400)
RBC: 3.94 MIL/uL — ABNORMAL LOW (ref 4.22–5.81)
RDW: 16 % — ABNORMAL HIGH (ref 11.5–15.5)
WBC: 7.9 10*3/uL (ref 4.0–10.5)
nRBC: 0 % (ref 0.0–0.2)

## 2021-11-08 LAB — GLUCOSE, CAPILLARY
Glucose-Capillary: 160 mg/dL — ABNORMAL HIGH (ref 70–99)
Glucose-Capillary: 123 mg/dL — ABNORMAL HIGH (ref 70–99)
Glucose-Capillary: 122 mg/dL — ABNORMAL HIGH (ref 70–99)
Glucose-Capillary: 100 mg/dL — ABNORMAL HIGH (ref 70–99)

## 2021-11-08 LAB — PROTIME-INR
INR: 1.4 — ABNORMAL HIGH (ref 0.8–1.2)
Prothrombin Time: 16.7 seconds — ABNORMAL HIGH (ref 11.4–15.2)

## 2021-11-08 LAB — MAGNESIUM: Magnesium: 2.1 mg/dL (ref 1.7–2.4)

## 2021-11-08 LAB — BRAIN NATRIURETIC PEPTIDE: B Natriuretic Peptide: 55 pg/mL (ref 0.0–100.0)

## 2021-11-08 MED ORDER — WARFARIN SODIUM 2 MG PO TABS
4.0000 mg | ORAL_TABLET | Freq: Once | ORAL | Status: AC
  Administered 2021-11-08: 4 mg via ORAL
  Filled 2021-11-08: qty 2

## 2021-11-08 NOTE — Progress Notes (Signed)
PROGRESS NOTE  Troy [name] EUM:[id number] DOB: 1941/02/27 DOA: 11/06/2021 PCP: Sharilyn Sites, MD  Brief History:  81 year old gentleman with a complex past medical history including coronary artery disease, heart failure preserved EF, severe pulmonary hypertension, type 2 diabetes mellitus, renal artery stenosis, hyperlipidemia, essential hypertension, anticoagulated with warfarin, esophageal dysphagia, peripheral vascular disease, atrial fibrillation, nocturnal hypoxemia on night oxygen who was just recently discharged from the [place] in [place] after being hospitalized for dizziness and dehydration.  He had an episode of gross hematuria and recently followed up with urology Dr. [name] where he had cystoscopy performed.  His gross hematuria had resolved.  He was brought into the emergency department by [facility] for increasing shortness of breath for 1 day.  He says that he had normally uses oxygen only at night but yesterday started wearing it at all times and became shortness of breath with rest.  He denied having any chest pain.  He has had whitish sputum production and reported feeling overall weak.  His workup in the ED revealed an elevated cardiac BNP and chest x-ray with findings of congestive heart failure.  He was started on IV Lasix and admission was requested for further management.   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   - Will need outpatient follow up after discharge    Permanent Atrial Fibrillation - HR well controlled, pt is anticoagulated with warfarin -Pharmacy consult for warfarin management in hospital -Following daily PT/INR   Hyperlipidemia - resumed home statin   Type 2 diabetes mellitus, controlled with vascular complications  -SSI coverage and frequent CBG monitoring in addition to prandial NovoLog ordered - 08/29/21 A1C--6.5  Esophageal dysphagia  -Patient reports that he is scheduled to have a barium swallow evaluation at [facility] on 8/8.  -Consulted to the SLP to see if he can have the study done while he is inpatient -SLP recommends regular diet with thin liquids   CAD s/p CABG at age 82 - he is being medically managed now and we have resumed his cardiac medications - Cath in 1/22 showed patent LIMA-LAD and SVG-D but occluded SVG-OM and SVG-RCA -continue imdur and metoprolol succinate and atorava   Acute on chronic respiratory failure with hypoxia - exacerbated by acute heart failure  - normally on night time oxygen only at 3L - continuous supplemental oxygen ordered   Essential hypertension  - resuming home medication and follow closely    PVD - severe  - he has had multiple bypass surgeries - resume medical management  - Occluded right fem-pop bypass.  No claudication - continue ASA      Family Communication:  no  Family at bedside  Consultants:  none  Code Status:  FULL   DVT Prophylaxis:  warfarin   Procedures: As Listed in Progress Note Above  Antibiotics: None      Subjective: Patient denies fevers, chills, headache, chest pain, dyspnea, nausea, vomiting, diarrhea, abdominal pain,  dysuria, hematuria, hematochezia, and melena.   Objective: Vitals:   11/07/21 1300 11/07/21 2048 11/08/21 0556 11/08/21 1429  BP: (!) 110/55 121/67 (!) 129/56 134/69  Pulse: 60 60 76 60  Resp: '18 18 14 16  '$ Temp: 97.9 F (36.6 C) (!) 97.4 F (36.3 C) 97.6 F  (36.4 C) 98.6 F (37 C)  TempSrc: Oral Oral  Oral  SpO2: 97% 95% 98% 100%  Weight:   89.9 kg   Height:        Intake/Output Summary (Last 24 hours) at 11/08/2021 1528 Last data filed at 11/08/2021 1430 Gross per 24 hour  Intake 1316 ml  Output 1350 ml  Net -34 ml   Weight change: -0.819 kg Exam:  General:  Pt is alert, follows commands appropriately, not in acute distress HEENT: No icterus, No thrush, No neck mass, [place]/AT Cardiovascular: RRR, S1/S2, no rubs, no gallops Respiratory: fine bibasilar crackles. No wheeze Abdomen: Soft/+BS, non tender, non distended, no guarding Extremities: trace LE edema, No lymphangitis, No petechiae, No rashes, no synovitis   Data Reviewed: I have personally reviewed following labs and imaging studies Basic Metabolic Panel: Recent Labs  Lab 11/06/21 1220 11/07/21 0410 11/08/21 0541  NA 136 141 140  K 3.5 3.7 3.6  CL 106 105 108  CO2 '25 27 26  '$ GLUCOSE 116* 112* 109*  BUN '11 17 17  '$ CREATININE 0.54* 0.78 0.66  CALCIUM 8.7* 8.6* 8.5*  MG  --  1.7 2.1   Liver Function Tests: No results for input(s): "AST", "ALT", "ALKPHOS", "BILITOT", "PROT", "ALBUMIN" in the last 168 hours. No results for input(s): "LIPASE", "AMYLASE" in the last 168 hours. No results for input(s): "AMMONIA" in the last 168 hours. Coagulation Profile: Recent Labs  Lab 11/06/21 1220 11/07/21 0410 11/08/21 0541  INR 1.3* 1.4* 1.4*   CBC: Recent Labs  Lab 11/06/21 1220 11/07/21 0410 11/08/21 0541  WBC 9.4 8.1 7.9  NEUTROABS 7.3  --   --   HGB 11.1* 11.2* 11.3*  HCT 34.9* 34.6* 35.5*  MCV 89.5 91.1 90.1  PLT 219 258 227   Cardiac Enzymes: No results for input(s): "CKTOTAL", "CKMB", "CKMBINDEX", "TROPONINI" in the last 168 hours. BNP: Invalid input(s): "POCBNP" CBG: Recent Labs  Lab 11/07/21 1607 11/07/21 2049 11/08/21 0716 11/08/21 0837 11/08/21 1159  GLUCAP 105* 99 160* 123* 122*   HbA1C: No results for input(s): "HGBA1C" in the last 72  hours. Urine analysis:    Component Value Date/Time   COLORURINE YELLOW 11/06/2021 1516   APPEARANCEUR CLEAR 11/06/2021 1516   APPEARANCEUR Clear 10/27/2021 1312   LABSPEC 1.008 11/06/2021 1516   PHURINE 8.0 11/06/2021 1516   GLUCOSEU NEGATIVE 11/06/2021 1516   HGBUR NEGATIVE 11/06/2021 1516   BILIRUBINUR NEGATIVE 11/06/2021 1516   BILIRUBINUR Negative 10/27/2021 1312   KETONESUR NEGATIVE 11/06/2021 1516   PROTEINUR NEGATIVE 11/06/2021 1516   NITRITE NEGATIVE 11/06/2021 1516   LEUKOCYTESUR NEGATIVE 11/06/2021 1516   Sepsis Labs: '@LABRCNTIP'$ (procalcitonin:4,lacticidven:4) ) Recent Results (from the past 240 hour(s))  Resp Panel by RT-PCR (Flu A&B, Covid) Anterior Nasal Swab     Status: None   Collection Time: 11/06/21 12:13 PM   Specimen: Anterior Nasal Swab  Result Value Ref Range Status   SARS Coronavirus 2 by RT PCR NEGATIVE NEGATIVE Final    Comment: (NOTE) SARS-CoV-2 target nucleic acids are NOT DETECTED.  The SARS-CoV-2 RNA is generally detectable in upper respiratory specimens during the acute phase of infection. The lowest concentration of SARS-CoV-2 viral copies this assay can detect  is 138 copies/mL. A negative result does not preclude SARS-Cov-2 infection and should not be used as the sole basis for treatment or other patient management decisions. A negative result may occur with  improper specimen collection/handling, submission of specimen other than nasopharyngeal swab, presence of viral mutation(s) within the areas targeted by this assay, and inadequate number of viral copies(<138 copies/mL). A negative result must be combined with clinical observations, patient history, and epidemiological information. The expected result is Negative.  Fact Sheet for Patients:  EntrepreneurPulse.com.au  Fact Sheet for Healthcare Providers:  IncredibleEmployment.be  This test is no t yet approved or cleared by the [place] FDA and   has been authorized for detection and/or diagnosis of SARS-CoV-2 by FDA under an Emergency Use Authorization (EUA). This EUA will remain  in effect (meaning this test can be used) for the duration of the COVID-19 declaration under Section 564(b)(1) of the Act, 21 U.S.C.section 360bbb-3(b)(1), unless the authorization is terminated  or revoked sooner.       Influenza A by PCR NEGATIVE NEGATIVE Final   Influenza B by PCR NEGATIVE NEGATIVE Final    Comment: (NOTE) The Xpert Xpress SARS-CoV-2/FLU/RSV plus assay is intended as an aid in the diagnosis of influenza from Nasopharyngeal swab specimens and should not be used as a sole basis for treatment. Nasal washings and aspirates are unacceptable for Xpert Xpress SARS-CoV-2/FLU/RSV testing.  Fact Sheet for Patients: EntrepreneurPulse.com.au  Fact Sheet for Healthcare Providers: IncredibleEmployment.be  This test is not yet approved or cleared by the [place] FDA and has been authorized for detection and/or diagnosis of SARS-CoV-2 by FDA under an Emergency Use Authorization (EUA). This EUA will remain in effect (meaning this test can be used) for the duration of the COVID-19 declaration under Section 564(b)(1) of the Act, 21 U.S.C. section 360bbb-3(b)(1), unless the authorization is terminated or revoked.  Performed at [facility], [address], [place], [place] [address]      Scheduled Meds:  amLODipine  10 mg Oral Daily   aspirin EC  81 mg Oral Q breakfast   atorvastatin  40 mg Oral QPM   furosemide  20 mg Intravenous BID   insulin aspart  0-15 Units Subcutaneous TID WC   insulin aspart  3 Units Subcutaneous TID WC   isosorbide mononitrate  120 mg Oral QPM   losartan  25 mg Oral Daily   metoprolol succinate  25 mg Oral Daily   mirabegron ER  25 mg Oral Daily   pantoprazole  40 mg Oral Daily   potassium chloride  20 mEq Oral Daily   tamsulosin  0.4 mg Oral QPC supper   warfarin   4 mg Oral ONCE-1600   Warfarin - Pharmacist Dosing Inpatient   Does not apply q1600   Continuous Infusions:  Procedures/Studies: Leanne Chang SPEECH PATH  Result Date: 11/07/2021 Table formatting from the original result was not included. Objective Swallowing Evaluation: Type of Study: Bedside Swallow Evaluation  Patient Details Name: DOMINIE BENEDICK MRN: [id number] Date of Birth: 11-21-1940 Today's Date: 11/07/2021 Time: SLP Start Time (ACUTE ONLY): 78 -SLP Stop Time (ACUTE ONLY): 8546 SLP Time Calculation (min) (ACUTE ONLY): 27 min HPI: MANLEY FASON is a 81 year old gentleman with a complex past medical history including coronary artery disease, heart failure preserved EF, severe pulmonary hypertension, type 2 diabetes mellitus, renal artery stenosis, hyperlipidemia, essential hypertension, anticoagulated with warfarin, esophageal dysphagia, peripheral vascular disease, atrial fibrillation, nocturnal hypoxemia on night oxygen who was just recently discharged from  the [place] in [place] after being hospitalized for dizziness and dehydration. Pt with hx of dysphonia, RVF immobility, oropharyngeal dysphagia, esophageal dysphagia and UES dysfunction. Pt has been treated at [place] by GI and SLP teams there. Most Recent MBSS was completed 12/2019 with recommendation for regular/thin and R head turn to facilitate clearance of pharyngeal residue.   Past Medical History: Past Medical History: Diagnosis Date  AAA (abdominal aortic aneurysm) ([place])   Followed by Dr. Sherren Mocha Early  Arthritis   CAD (coronary artery disease)   a. CABG 2000.  Cancer [facility])   Prostate:  Radiation Tx  Carotid artery disease ([place])   Chest pain   precordial. mild chronic .[name]... nonischemic  CHF (congestive heart failure) (H[facility])   Chronic edema   Coronary artery disease   a. Nuclear, January, 2008, no ischemia b. Cath 08/2012- 1/4 patent grafts, RCA CTO, no flow-limiting disease, medically managed  Diabetes mellitus without  complication ([place])   Dizziness 02/2011  Dyslipidemia   Fall   GERD (gastroesophageal reflux disease)   TAKES TUMS & ROLAIDS AS NEEDED  Hx of CABG 2000  Hypertension   Myocardial infarction ([place]) 1999  Neck pain 02/2011  Pneumonia   Pulmonary hypertension ([place])   Renal artery stenosis ([facility])   50-70%  S/P femoropopliteal bypass surgery   Dr. Donnetta Hutching  Sinus bradycardia   Asymptomatic Past Surgical History: Past Surgical History: Procedure Laterality Date  CARDIAC CATHETERIZATION  09/26/2012  1/4 patent bypass (occluded SVG-PDA, SVG-OM, LIMA-LAD), SVG-diagonal patent and fills the diagonal and LAD, distal RCA occlusion with left to right collateralization, patent circumflex, LAD with no flow-limiting disease and antegrade flow competitively from SVG-diagonal; EF 60-65%  COLONOSCOPY  11/30/2009  JJK:[id number]. next TCS 11/2019  COLONOSCOPY WITH PROPOFOL N/A 12/18/2019  Procedure: COLONOSCOPY WITH PROPOFOL;  Surgeon: Harvel Quale, MD;  [facility];  Service: Gastroenterology;  Laterality: N/A;  1030  CORONARY ARTERY BYPASS GRAFT  2000  CYSTOSCOPY N/A 08/31/2021  Procedure: CYSTOSCOPY;  Surgeon: Cleon Gustin, MD;  Location: [facility] ORS;  Service: Urology;  Laterality: N/A;  pt knows to arrive at 7:00  ESOPHAGOGASTRODUODENOSCOPY N/A 08/12/2013  CVE:[id number] peptic stricture with erosive refluxesophagitis - status post Maloney dilation. Hiatal hernia. Abnormalgastric mucosa. Deformity of the pyloric channel suggestive ofprior peptic ulcer disease. Duodenal bulbar diverticulum Statuspost gastric biopsy. h.pylori  LEFT HEART CATHETERIZATION WITH CORONARY/GRAFT ANGIOGRAM N/A 09/26/2012  Procedure: LEFT HEART CATHETERIZATION WITH Beatrix Fetters;  Surgeon: Burnell Blanks, MD;  Location: [facility] CATH LAB;  Service: Cardiovascular;  Laterality: N/A;  MALONEY DILATION N/A 08/12/2013  Procedure: Venia Minks DILATION;  Surgeon: Daneil Dolin, MD;  Location: [facility];  Service: Endoscopy;   Laterality: N/A;  POLYPECTOMY  12/18/2019  Procedure: POLYPECTOMY;  Surgeon: Harvel Quale, MD;  Location: [facility];  Service: Gastroenterology;;  ascending colon polyp   PR VEIN BYPASS GRAFT,AORTO-FEM-POP Right 07/19/1999  PR VEIN BYPASS GRAFT,AORTO-FEM-POP Left 05/02/2006  RIGHT HEART CATH AND CORONARY/GRAFT ANGIOGRAPHY N/A 04/11/2020  Procedure: RIGHT HEART CATH AND CORONARY/GRAFT ANGIOGRAPHY;  Surgeon: Larey Dresser, MD;  Location: [facility] LAB;  Service: Cardiovascular;  Laterality: N/A;  SAVORY DILATION N/A 08/12/2013  Procedure: SAVORY DILATION;  Surgeon: Daneil Dolin, MD;  Location[facility];  Service: Endoscopy;  Laterality: N/A;  TRANSURETHRAL RESECTION OF BLADDER TUMOR N/A 08/31/2021  Procedure: TRANSURETHRAL RESECTION OF BLADDER TUMOR (TURBT);  Surgeon: Cleon Gustin, MD;  Location: [facility] OR[facility];  Service: Urology;  Laterality: N/A;  WRIST SURGERY  cyst removal No data recorded No data recorded  Recommendations for follow up therapy are one component of a multi-disciplinary discharge planning process, led by the attending physician.  Recommendations may be updated based on patient status, additional functional criteria and insurance authorization. Assessment / Plan / Recommendation   11/07/2021  11:00 AM Clinical Impressions Clinical Impression Patient presents with stable, moderate sensorimotor oropharyngeal dysphagia. Compared to previous MBSS completed 04/21/19 (reviewed in care everywhere), PT's presentation is largely unchanged. Continue to note reduced bolus propulsion and UES distention resulting in moderate-severe, diffuse post-swallow bolus residual in the pharynx. Continue to noted that head turn to the right with slight chin tuck is most effective in improving pharyngeal clearance. Deficits also result in laryngeal penetration of thin liquid after the swallow. Trace aspiraiton of thin liquid X1 with initial tsp sip of thin liquids. Stasis of barium tablet was observed  at the level of the UES which required multiple liquid washes. Pt does report sensate to varying amounts of pharyngeal residue. Based on today's evaluation, recommend patient continue regular solid diet with thin liquids and strict adherence to aspiration precautions and swallowing strategies below. Continue to recommend GI consult and possible need for repeat esophageal dilation and/or treatment for esophageal dysphagia at physician discretion as indicated. Education: Pt was educated at to results and recommendations; he was educated as to risks and adverse outcomes associated with dysphagia and aspiration (i.e. possible life threatening PNA). Reviewed rationale for adherence to recommendations. Pt verbalized understanding. SLP Visit Diagnosis Dysphagia, oropharyngeal phase (R13.12) Impact on safety and function Moderate aspiration risk     11/07/2021  11:00 AM Treatment Recommendations Treatment Recommendations Therapy as outlined in treatment plan below     11/07/2021  11:00 AM Prognosis Prognosis for Safe Diet [facility]   11/07/2021  11:00 AM Diet Recommendations SLP Diet Recommendations Regular solids;Thin liquid Liquid Administration via Cup;Straw Medication Administration Whole meds with liquid Compensations Minimize environmental distractions;Slow rate;Multiple dry swallows after each bite/sip;Follow solids with liquid;Chin tuck Postural Changes Seated upright at 90 degrees;Remain semi-upright after after feeds/meals (Comment)     11/07/2021  11:00 AM Other Recommendations Recommended Consults Consider GI evaluation;Consider esophageal assessment Oral Care Recommendations Oral care BID Follow Up Recommendations Outpatient SLP   11/07/2021  11:00 AM Frequency and Duration  Speech Therapy Frequency (ACUTE ONLY) min 2x/week Treatment Duration 1 week     11/07/2021  11:00 AM Oral Phase Oral Phase [facility]    11/07/2021  11:00 AM Pharyngeal Phase Pharyngeal Phase Impaired Pharyngeal- Thin Teaspoon Reduced epiglottic  inversion;Reduced anterior laryngeal mobility;Reduced laryngeal elevation;Reduced airway/laryngeal closure;Reduced tongue base retraction;Penetration/Aspiration before swallow;Trace aspiration;Pharyngeal residue - valleculae;Pharyngeal residue - pyriform Pharyngeal Material enters airway, passes BELOW cords without attempt by patient to eject out (silent aspiration) Pharyngeal- Thin Cup Reduced epiglottic inversion;Reduced anterior laryngeal mobility;Reduced laryngeal elevation;Reduced airway/laryngeal closure;Reduced tongue base retraction;Penetration/Aspiration before swallow;Pharyngeal residue - valleculae;Pharyngeal residue - pyriform;Penetration/Aspiration during swallow Pharyngeal Material enters airway, remains ABOVE vocal cords then ejected out Pharyngeal- Thin Straw Reduced epiglottic inversion;Reduced anterior laryngeal mobility;Reduced laryngeal elevation;Reduced airway/laryngeal closure;Reduced tongue base retraction;Penetration/Aspiration before swallow;Pharyngeal residue - valleculae;Pharyngeal residue - pyriform;Penetration/Aspiration during swallow Pharyngeal Material enters airway, remains ABOVE vocal cords then ejected out Pharyngeal- Puree Reduced epiglottic inversion;Reduced anterior laryngeal mobility;Reduced laryngeal elevation;Reduced airway/laryngeal closure;Reduced tongue base retraction;Penetration/Aspiration before swallow;Pharyngeal residue - valleculae;Pharyngeal residue - pyriform;Compensatory strategies attempted (with notebox);Inter-arytenoid space residue;Pharyngeal residue - posterior pharnyx;Pharyngeal residue - cp segment Pharyngeal- Mechanical Soft NT Pharyngeal- Regular Reduced epiglottic inversion;Reduced anterior laryngeal mobility;Reduced laryngeal elevation;Reduced airway/laryngeal closure;Reduced tongue base retraction;Penetration/Aspiration  before swallow;Pharyngeal residue - valleculae;Pharyngeal residue - pyriform;Compensatory strategies attempted (with  notebox);Inter-arytenoid space residue;Pharyngeal residue - posterior pharnyx;Pharyngeal residue - cp segment Pharyngeal Material enters airway, remains ABOVE vocal cords then ejected out Pharyngeal- Multi-consistency NT Pharyngeal- Pill Reduced epiglottic inversion;Reduced anterior laryngeal mobility;Reduced laryngeal elevation;Reduced airway/laryngeal closure;Reduced tongue base retraction;Penetration/Aspiration before swallow;Pharyngeal residue - valleculae;Pharyngeal residue - pyriform;Compensatory strategies attempted (with notebox);Inter-arytenoid space residue;Pharyngeal residue - posterior pharnyx;Pharyngeal residue - cp segment    11/07/2021  11:00 AM Cervical Esophageal Phase  Cervical Esophageal Phase Impaired Thin Teaspoon Reduced cricopharyngeal relaxation Thin Cup Reduced cricopharyngeal relaxation Thin Straw Reduced cricopharyngeal relaxation Puree Reduced cricopharyngeal relaxation Mechanical Soft NT Regular Reduced cricopharyngeal relaxation Multi-consistency NT Pill Reduced cricopharyngeal relaxation Amelia H. Roddie Mc, CCC-SLP Speech Language Pathologist Wende Bushy 11/07/2021, 11:43 AM                     DG Chest 2 View  Result Date: 11/06/2021 CLINICAL DATA:  Shortness of breath. Cough. Hypertension. Ex-smoker. Prostate cancer. EXAM: CHEST - 2 VIEW COMPARISON:  03/02/2021 CT.  10/05/2020 plain film. FINDINGS: Numerous leads and wires project over the chest. Midline trachea. Mild cardiomegaly. Atherosclerosis in the transverse aorta. Tiny bilateral pleural effusions are most apparent on the lateral view. No pneumothorax. Interstitial edema is mild. Patchy bibasilar airspace disease. IMPRESSION: Mild cardiomegaly with congestive heart failure and small bilateral pleural effusions. Patchy bibasilar airspace disease, favoring atelectasis. Aortic Atherosclerosis (ICD10-I70.0). Electronically Signed   By: Abigail Miyamoto M.D.   On: 11/06/2021 12:39    Orson Eva, DO  [facility]  If  7PM-7AM, please contact night-coverage [email or web address] Password [email or web address] 11/08/2021, 3:28 PM   LOS: 2 days

## 2021-11-08 NOTE — Progress Notes (Signed)
ANTICOAGULATION CONSULT NOTE -   Pharmacy Consult for warfarin Indication: atrial fibrillation  Patient Measurements: Height: '5\' 10"'$  (177.8 cm) Weight: 89.9 kg (198 lb 3.1 oz) IBW/kg (Calculated) : 73  Labs: Recent Labs    11/06/21 1220 11/07/21 0410 11/08/21 0541  HGB 11.1* 11.2* 11.3*  HCT 34.9* 34.6* 35.5*  PLT 219 258 227  LABPROT 16.2* 16.7* 16.7*  INR 1.3* 1.4* 1.4*  CREATININE 0.54* 0.78 0.66  TROPONINIHS 8  --   --     Estimated Creatinine Clearance: 85.9 mL/min (by C-G formula based on SCr of 0.66 mg/dL).  Assessment: 81 year old male hx Afib on chronic coumadin - home dose is '2mg'$  alternating with 1gm. Admitted with a subtherapeutic INR, slow to rise. Will load patient acutely. No adverse events noted  CBC WNL INR 1.3> 1.4>1.4  Goal of Therapy:  INR 2-3 Monitor platelets by anticoagulation protocol: Yes   Plan:  Warfarin '4mg'$  po x 1 Monitor daily INR and s/s of bleeding.  Lorenso Courier, PharmD Clinical Pharmacist 11/08/2021 9:15 AM

## 2021-11-08 NOTE — Plan of Care (Signed)
  Problem: Education: Goal: Knowledge of General Education information will improve Description: Including pain rating scale, medication(s)/side effects and non-pharmacologic comfort measures Outcome: Progressing   Problem: Health Behavior/Discharge Planning: Goal: Ability to manage health-related needs will improve Outcome: Progressing   Problem: Clinical Measurements: Goal: Respiratory complications will improve Outcome: Progressing   Problem: Activity: Goal: Risk for activity intolerance will decrease Outcome: Progressing   Problem: Nutrition: Goal: Adequate nutrition will be maintained Outcome: Progressing   Problem: Elimination: Goal: Will not experience complications related to urinary retention Outcome: Progressing   Problem: Pain Managment: Goal: General experience of comfort will improve Outcome: Progressing   Problem: Safety: Goal: Ability to remain free from injury will improve Outcome: Progressing   Problem: Skin Integrity: Goal: Risk for impaired skin integrity will decrease Outcome: Progressing

## 2021-11-08 NOTE — Progress Notes (Signed)
PT Cancellation Note  Patient Details Name: Troy [name] MRN: [id number] DOB: February 01, 1941   Cancelled Treatment:  Attempted PT session.  Pt sitting on EOB eating.  Will try again tomorrow.     Ihor Austin, LPTA/CLT; CBIS [phone]  Aldona Lento 11/08/2021, 5:20 PM

## 2021-11-08 NOTE — TOC Progression Note (Signed)
Transition of Care [facility]) - Progression Note    Patient Details  Name: Troy [name] MRN: [id number] Date of Birth: 1940-11-12  Transition of Care [facility]) CM/SW [place], [place] Phone Number: 11/08/2021, 11:42 AM  Clinical Narrative:    CSW spoke to [place] with [facility] who states that she is able to accept pts [facility] referral a this time. CSW requested that [facility] orders. TOC to follow.   Expected Discharge Plan: [place] Barriers to Discharge: Continued Medical Work up  Expected Discharge Plan and Services Expected Discharge Plan: [place] In-house Referral: Clinical Social Work Discharge Planning Services: CM Consult   Living arrangements for the past 2 months: Single Family Home                                       Social Determinants of Health (SDOH) Interventions    Readmission Risk Interventions    11/07/2021   12:48 PM  Readmission Risk Prevention Plan  Transportation Screening Complete  Home Care Screening Complete  Medication Review (RN CM) Complete

## 2021-11-09 ENCOUNTER — Telehealth: Payer: Self-pay

## 2021-11-09 DIAGNOSIS — I5031 Acute diastolic (congestive) heart failure: Secondary | ICD-10-CM | POA: Diagnosis not present

## 2021-11-09 DIAGNOSIS — I272 Pulmonary hypertension, unspecified: Secondary | ICD-10-CM | POA: Diagnosis not present

## 2021-11-09 DIAGNOSIS — I4821 Permanent atrial fibrillation: Secondary | ICD-10-CM | POA: Diagnosis not present

## 2021-11-09 LAB — BASIC METABOLIC PANEL
Anion gap: 6 (ref 5–15)
BUN: 19 mg/dL (ref 8–23)
CO2: 27 mmol/L (ref 22–32)
Calcium: 8.5 mg/dL — ABNORMAL LOW (ref 8.9–10.3)
Chloride: 105 mmol/L (ref 98–111)
Creatinine, Ser: 0.82 mg/dL (ref 0.61–1.24)
GFR, Estimated: >60 mL/min (ref >60)
Glucose, Bld: 118 mg/dL — ABNORMAL HIGH (ref 70–99)
Potassium: 4 mmol/L (ref 3.5–5.1)
Sodium: 138 mmol/L (ref 135–145)

## 2021-11-09 LAB — GLUCOSE, CAPILLARY
Glucose-Capillary: 107 mg/dL — ABNORMAL HIGH (ref 70–99)
Glucose-Capillary: 114 mg/dL — ABNORMAL HIGH (ref 70–99)
Glucose-Capillary: 110 mg/dL — ABNORMAL HIGH (ref 70–99)

## 2021-11-09 LAB — PROTIME-INR
INR: 1.4 — ABNORMAL HIGH (ref 0.8–1.2)
Prothrombin Time: 16.9 seconds — ABNORMAL HIGH (ref 11.4–15.2)

## 2021-11-09 LAB — MAGNESIUM: Magnesium: 2 mg/dL (ref 1.7–2.4)

## 2021-11-09 MED ORDER — WARFARIN SODIUM 2 MG PO TABS: 4.0000 mg | ORAL_TABLET | Freq: Once | ORAL | Status: DC

## 2021-11-09 NOTE — Discharge Summary (Signed)
Physician Discharge Summary   Patient: Troy [name] MRN: [id number] DOB: [birth year]  Admit date:     11/06/2021  Discharge date: 11/09/21  Discharge Physician: Shanon Brow Stephanie Littman   PCP: Sharilyn Sites, MD   Recommendations at discharge:   Please follow up with primary care provider within 1-2 weeks  Please repeat BMP and CBC in one week     Hospital Course: 81 year old gentleman with a complex past medical history including coronary artery disease, heart failure preserved EF, severe pulmonary hypertension, type 2 diabetes mellitus, renal artery stenosis, hyperlipidemia, essential hypertension, anticoagulated with warfarin, esophageal dysphagia, peripheral vascular disease, atrial fibrillation, nocturnal hypoxemia on night oxygen who was just recently discharged from the [place] in [place] after being hospitalized for dizziness and dehydration.  He had an episode of gross hematuria and recently followed up with urology Dr. [name] where he had cystoscopy performed.  His gross hematuria had resolved.  He was brought into the emergency department by [facility] for increasing shortness of breath for 1 day.  He says that he had normally uses oxygen only at night but yesterday started wearing it at all times and became shortness of breath with rest.  He denied having any chest pain.  He has had whitish sputum production and reported feeling overall weak.  His workup in the ED revealed an elevated cardiac BNP and chest x-ray with findings of congestive heart failure.  He was started on IV Lasix and admission was requested for further management.  Assessment and Plan: Acute on chronic HFpEF - pt presents with symptomatic volume overload as evidenced on chest x-ray, physical exam and elevated BNP -Patient reports compliance with home diuretics however recently hospitalized for dehydration and likely treated with IV fluid hydration -iuresed with IV Lasix, 20 mg BID -Monitor intake/output, daily  weight -2 g sodium diet and fluid restriction - ordered ReDs Clip--28 Discharge weight 198.2 lbs D/c home with PTA torsemide dose 10 mg qod   Severe Pulmonary Hypertension  - Pt is being followed by advanced HF clinic -Moderate PH on RHC in 1/22 - 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  - Dr. Aundra Dubin outpatient follow up after discharge    Permanent Atrial Fibrillation - HR well controlled, pt is anticoagulated with warfarin -Pharmacy consult for warfarin management in hospital -Following daily PT/INR  Subtherapeutic INR -PharmD has been assisting with dosing during hospitalization -pt instructed to take warfarin 4 mg today -pt states he will get his INR checked on 11/10/21 at PCP office   Hyperlipidemia - resumed home statin   Type 2 diabetes mellitus, controlled with vascular complications  -SSI coverage and frequent CBG monitoring in addition to prandial NovoLog ordered - 08/29/21 A1C--6.5   Esophageal dysphagia  -Patient reports that he is scheduled to have a barium swallow evaluation at [facility] on 8/8.  -Consulted to the SLP to see if he can have the study done while he is inpatient -SLP recommends regular diet with thin liquids   CAD s/p CABG at age 87 - he is being medically managed now and we have resumed his cardiac medications - Cath in 1/22 showed patent LIMA-LAD and SVG-D but occluded SVG-OM and SVG-RCA -continue imdur and metoprolol succinate and atorava   Acute on chronic respiratory failure with hypoxia - exacerbated by acute heart failure  - normally on night time oxygen only at 3L - continuous supplemental oxygen ordered - weaned to RA - ambulated on day of dc without any desaturatino < 95%   Essential  hypertension  - resuming home medication and follow closely    PVD - severe  - he has had multiple bypass surgeries - resume medical management  - Occluded right fem-pop bypass.  No claudication - continue ASA   Consultants: none Procedures performed:  none  Disposition: Home Diet recommendation:  Cardiac diet DISCHARGE MEDICATION: Allergies as of 11/09/2021       Reactions   Niacin Itching, Rash   Burning sensation   Contrast Media [iodinated Contrast Media] Nausea And Vomiting   Iodine-131 Nausea And Vomiting   Nsaids Other (See Comments)   Taking Coumadin        Medication List     TAKE these medications    acetaminophen 325 MG tablet Commonly known as: TYLENOL Take 2 tablets (650 mg total) by mouth every 6 (six) hours as needed for mild pain, fever or headache (or Fever >/= 101).   albuterol 108 (90 Base) MCG/ACT inhaler Commonly known as: VENTOLIN HFA Inhale 2 puffs into the lungs every 6 (six) hours as needed for wheezing or shortness of breath.   amLODipine 10 MG tablet Commonly known as: NORVASC Take 10 mg by mouth daily.   aspirin EC 81 MG tablet Take 1 tablet (81 mg total) by mouth daily with breakfast.   atorvastatin 40 MG tablet Commonly known as: LIPITOR Take 1 tablet (40 mg total) by mouth daily.   famotidine 20 MG tablet Commonly known as: Pepcid One after supper   HYDROcodone-acetaminophen 5-325 MG tablet Commonly known as: NORCO/VICODIN Take 1 tablet by mouth daily as needed for moderate pain.   ipratropium 0.03 % nasal spray Commonly known as: ATROVENT Place 2 sprays into both nostrils as needed for rhinitis.   ipratropium-albuterol 0.5-2.5 (3) MG/3ML Soln Commonly known as: DUONEB Take 3 mLs by nebulization every 4 (four) hours as needed.   isosorbide mononitrate 60 MG 24 hr tablet Commonly known as: IMDUR Take 2 tablets (120 mg total) by mouth every evening.   losartan 25 MG tablet Commonly known as: COZAAR Take 1 tablet (25 mg total) by mouth daily.   metFORMIN 1000 MG tablet Commonly known as: GLUCOPHAGE Take 500 mg by mouth 2 (two) times daily with a meal.   metoprolol succinate 25 MG 24 hr tablet Commonly known as: TOPROL-XL Take 1 tablet (25 mg total) by mouth daily.  Pt. Needs to make an appt. With Cardiologist in order to receive further refills. Thank You. 1st Attempt.   mirabegron ER 25 MG Tb24 tablet Commonly known as: MYRBETRIQ Take 1 tablet (25 mg total) by mouth daily.   nitroGLYCERIN 0.4 MG SL tablet Commonly known as: NITROSTAT Place 1 tablet (0.4 mg total) under the tongue every 5 (five) minutes x 3 doses as needed for chest pain.   OXYGEN Inhale 3 L into the lungs at bedtime.   pantoprazole 40 MG tablet Commonly known as: PROTONIX Take 1 tablet (40 mg total) by mouth daily. Take 30-60 min before first meal of the day   potassium chloride 10 MEQ tablet Commonly known as: KLOR-CON M Take 1 tablet (10 mEq total) by mouth daily. Only take while taking Torsemide   tamsulosin 0.4 MG Caps capsule Commonly known as: FLOMAX Take 1 capsule (0.4 mg total) by mouth daily after supper.   torsemide 10 MG tablet Commonly known as: DEMADEX Take 1 tablet (10 mg total) by mouth every other day. Take 20 mg for weight gain over 3 lbs in 24 hours   warfarin 1 MG tablet Commonly  known as: COUMADIN Take 1 mg by mouth every evening. Take 2 mg  every other day and 1 mg all others        Follow-up Information     [facility] Follow [facility].   Specialty: [place] Why: [place] staff will call you to schedule in home visits               Discharge Exam: Filed Weights   11/07/21 0500 11/08/21 0556 11/09/21 0500  Weight: 89.4 kg 89.9 kg 89.9 kg  HEENT:  [place]/AT, No thrush, no icterus CV:  IRRR, no rub, no S3, no S4 Lung:  CTA, no wheeze, no rhonchi Abd:  soft/+BS, NT Ext:  No edema, no lymphangitis, no synovitis, no rash   Condition at discharge: stable  The results of significant diagnostics from this hospitalization (including imaging, microbiology, ancillary and laboratory) are listed below for reference.   Imaging Studies: Leanne Chang SPEECH PATH  Result Date: 11/07/2021 Table formatting  from the original result was not included. Objective Swallowing Evaluation: Type of Study: Bedside Swallow Evaluation  Patient Details Name: MATHIEU SCHLOEMER MRN: [id number] Date of Birth: Feb 13, 1941 Today's Date: 11/07/2021 Time: SLP Start Time (ACUTE ONLY): 28 -SLP Stop Time (ACUTE ONLY): 0814 SLP Time Calculation (min) (ACUTE ONLY): 27 min HPI: JORAH HUA is a 81 year old gentleman with a complex past medical history including coronary artery disease, heart failure preserved EF, severe pulmonary hypertension, type 2 diabetes mellitus, renal artery stenosis, hyperlipidemia, essential hypertension, anticoagulated with warfarin, esophageal dysphagia, peripheral vascular disease, atrial fibrillation, nocturnal hypoxemia on night oxygen who was just recently discharged from the [place] in [place] after being hospitalized for dizziness and dehydration. Pt with hx of dysphonia, RVF immobility, oropharyngeal dysphagia, esophageal dysphagia and UES dysfunction. Pt has been treated at [place] by GI and SLP teams there. Most Recent MBSS was completed 12/2019 with recommendation for regular/thin and R head turn to facilitate clearance of pharyngeal residue.   Past Medical History: Past Medical History: Diagnosis Date  AAA (abdominal aortic aneurysm) ([place])   Followed by Dr. Sherren Mocha Early  Arthritis   CAD (coronary artery disease)   a. CABG 2000.  [facility])   Prostate:  Radiation Tx  Carotid artery disease ([place])   Chest pain   precordial. mild chronic .[name]... nonischemic  CHF (congestive heart failure) (HCC)   Chronic edema   Coronary artery disease   a. Nuclear, January, 2008, no ischemia b. Cath 08/2012- 1/4 patent grafts, RCA CTO, no flow-limiting disease, medically managed  Diabetes mellitus without complication ([place])   Dizziness 02/2011  Dyslipidemia   Fall   GERD (gastroesophageal reflux disease)   TAKES TUMS & ROLAIDS AS NEEDED  Hx of CABG 2000  Hypertension   Myocardial infarction ([place]) 1999  Neck pain  02/2011  Pneumonia   Pulmonary hypertension ([place])   Renal artery stenosis (HCC)   50-70%  S/P femoropopliteal bypass surgery   Dr. Donnetta Hutching  Sinus bradycardia   Asymptomatic Past Surgical History: Past Surgical History: Procedure Laterality Date  CARDIAC CATHETERIZATION  09/26/2012  1/4 patent bypass (occluded SVG-PDA, SVG-OM, LIMA-LAD), SVG-diagonal patent and fills the diagonal and LAD, distal RCA occlusion with left to right collateralization, patent circumflex, LAD with no flow-limiting disease and antegrade flow competitively from SVG-diagonal; EF 60-65%  COLONOSCOPY  11/30/2009  GYJ:[id number]. next TCS 11/2019  COLONOSCOPY WITH PROPOFOL N/A 12/18/2019  Procedure: COLONOSCOPY WITH PROPOFOL;  Surgeon: Harvel Quale, MD;  [facility]: [facility]  ENDO SUITE;  Service: Gastroenterology;  Laterality: N/A;  1030  CORONARY ARTERY BYPASS GRAFT  2000  CYSTOSCOPY N/A 08/31/2021  Procedure: CYSTOSCOPY;  Surgeon: Cleon Gustin, MD;  Location: [facility] ORS;  Service: Urology;  Laterality: N/A;  pt knows to arrive at 7:00  ESOPHAGOGASTRODUODENOSCOPY N/A 08/12/2013  [id number] peptic stricture with erosive refluxesophagitis - status post Maloney dilation. Hiatal hernia. Abnormalgastric mucosa. Deformity of the pyloric channel suggestive ofprior peptic ulcer disease. Duodenal bulbar diverticulum Statuspost gastric biopsy. h.pylori  LEFT HEART CATHETERIZATION WITH CORONARY/GRAFT ANGIOGRAM N/A 09/26/2012  Procedure: LEFT HEART CATHETERIZATION WITH Beatrix Fetters;  Surgeon: Burnell Blanks, MD;  Location: [facility] CATH LAB;  Service: Cardiovascular;  Laterality: N/A;  MALONEY DILATION N/A 08/12/2013  Procedure: Venia Minks DILATION;  Surgeon: Daneil Dolin, MD;  Location: [facility] ENDO SUITE;  Service: Endoscopy;  Laterality: N/A;  POLYPECTOMY  12/18/2019  Procedure: POLYPECTOMY;  Surgeon: Harvel Quale, MD;  Location: [facility] ENDO SUITE;  Service: Gastroenterology;;  ascending colon polyp   PR VEIN BYPASS  GRAFT,AORTO-FEM-POP Right 07/19/1999  PR VEIN BYPASS GRAFT,AORTO-FEM-POP Left 05/02/2006  RIGHT HEART CATH AND CORONARY/GRAFT ANGIOGRAPHY N/A 04/11/2020  Procedure: RIGHT HEART CATH AND CORONARY/GRAFT ANGIOGRAPHY;  Surgeon: Larey Dresser, MD;  Location: [place] CV LAB;  Service: Cardiovascular;  Laterality: N/A;  SAVORY DILATION N/A 08/12/2013  Procedure: SAVORY DILATION;  Surgeon: Daneil Dolin, MD;  Location: [facility] ENDO SUITE;  Service: Endoscopy;  Laterality: N/A;  TRANSURETHRAL RESECTION OF BLADDER TUMOR N/A 08/31/2021  Procedure: TRANSURETHRAL RESECTION OF BLADDER TUMOR (TURBT);  Surgeon: Cleon Gustin, MD;  Location: [facility] ORS;  Service: Urology;  Laterality: N/A;  WRIST SURGERY    cyst removal No data recorded No data recorded  Recommendations for follow up therapy are one component of a multi-disciplinary discharge planning process, led by the attending physician.  Recommendations may be updated based on patient status, additional functional criteria and insurance authorization. Assessment / Plan / Recommendation   11/07/2021  11:00 AM Clinical Impressions Clinical Impression Patient presents with stable, moderate sensorimotor oropharyngeal dysphagia. Compared to previous MBSS completed 04/21/19 (reviewed in care everywhere), PT's presentation is largely unchanged. Continue to note reduced bolus propulsion and UES distention resulting in moderate-severe, diffuse post-swallow bolus residual in the pharynx. Continue to noted that head turn to the right with slight chin tuck is most effective in improving pharyngeal clearance. Deficits also result in laryngeal penetration of thin liquid after the swallow. Trace aspiraiton of thin liquid X1 with initial tsp sip of thin liquids. Stasis of barium tablet was observed at the level of the UES which required multiple liquid washes. Pt does report sensate to varying amounts of pharyngeal residue. Based on today's evaluation, recommend patient continue regular solid  diet with thin liquids and strict adherence to aspiration precautions and swallowing strategies below. Continue to recommend GI consult and possible need for repeat esophageal dilation and/or treatment for esophageal dysphagia at physician discretion as indicated. Education: Pt was educated at to results and recommendations; he was educated as to risks and adverse outcomes associated with dysphagia and aspiration (i.e. possible life threatening PNA). Reviewed rationale for adherence to recommendations. Pt verbalized understanding. SLP Visit Diagnosis Dysphagia, oropharyngeal phase (R13.12) Impact on safety and function Moderate aspiration risk     11/07/2021  11:00 AM Treatment Recommendations Treatment Recommendations Therapy as outlined in treatment plan below     11/07/2021  11:00 AM Prognosis Prognosis for Safe Diet [facility]   11/07/2021  11:00 AM Diet Recommendations SLP Diet Recommendations Regular  solids;Thin liquid Liquid Administration via Cup;Straw Medication Administration Whole meds with liquid Compensations Minimize environmental distractions;Slow rate;Multiple dry swallows after each bite/sip;Follow solids with liquid;Chin tuck Postural Changes Seated upright at 90 degrees;Remain semi-upright after after feeds/meals (Comment)     11/07/2021  11:00 AM Other Recommendations Recommended Consults Consider GI evaluation;Consider esophageal assessment Oral Care Recommendations Oral care BID Follow Up Recommendations Outpatient SLP   11/07/2021  11:00 AM Frequency and Duration  Speech Therapy Frequency (ACUTE ONLY) min 2x/week Treatment Duration 1 week     11/07/2021  11:00 AM Oral Phase Oral Phase [facility]    11/07/2021  11:00 AM Pharyngeal Phase Pharyngeal Phase Impaired Pharyngeal- Thin Teaspoon Reduced epiglottic inversion;Reduced anterior laryngeal mobility;Reduced laryngeal elevation;Reduced airway/laryngeal closure;Reduced tongue base retraction;Penetration/Aspiration before swallow;Trace aspiration;Pharyngeal  residue - valleculae;Pharyngeal residue - pyriform Pharyngeal Material enters airway, passes BELOW cords without attempt by patient to eject out (silent aspiration) Pharyngeal- Thin Cup Reduced epiglottic inversion;Reduced anterior laryngeal mobility;Reduced laryngeal elevation;Reduced airway/laryngeal closure;Reduced tongue base retraction;Penetration/Aspiration before swallow;Pharyngeal residue - valleculae;Pharyngeal residue - pyriform;Penetration/Aspiration during swallow Pharyngeal Material enters airway, remains ABOVE vocal cords then ejected out Pharyngeal- Thin Straw Reduced epiglottic inversion;Reduced anterior laryngeal mobility;Reduced laryngeal elevation;Reduced airway/laryngeal closure;Reduced tongue base retraction;Penetration/Aspiration before swallow;Pharyngeal residue - valleculae;Pharyngeal residue - pyriform;Penetration/Aspiration during swallow Pharyngeal Material enters airway, remains ABOVE vocal cords then ejected out Pharyngeal- Puree Reduced epiglottic inversion;Reduced anterior laryngeal mobility;Reduced laryngeal elevation;Reduced airway/laryngeal closure;Reduced tongue base retraction;Penetration/Aspiration before swallow;Pharyngeal residue - valleculae;Pharyngeal residue - pyriform;Compensatory strategies attempted (with notebox);Inter-arytenoid space residue;Pharyngeal residue - posterior pharnyx;Pharyngeal residue - cp segment Pharyngeal- Mechanical Soft NT Pharyngeal- Regular Reduced epiglottic inversion;Reduced anterior laryngeal mobility;Reduced laryngeal elevation;Reduced airway/laryngeal closure;Reduced tongue base retraction;Penetration/Aspiration before swallow;Pharyngeal residue - valleculae;Pharyngeal residue - pyriform;Compensatory strategies attempted (with notebox);Inter-arytenoid space residue;Pharyngeal residue - posterior pharnyx;Pharyngeal residue - cp segment Pharyngeal Material enters airway, remains ABOVE vocal cords then ejected out Pharyngeal- Multi-consistency NT  Pharyngeal- Pill Reduced epiglottic inversion;Reduced anterior laryngeal mobility;Reduced laryngeal elevation;Reduced airway/laryngeal closure;Reduced tongue base retraction;Penetration/Aspiration before swallow;Pharyngeal residue - valleculae;Pharyngeal residue - pyriform;Compensatory strategies attempted (with notebox);Inter-arytenoid space residue;Pharyngeal residue - posterior pharnyx;Pharyngeal residue - cp segment    11/07/2021  11:00 AM Cervical Esophageal Phase  Cervical Esophageal Phase Impaired Thin Teaspoon Reduced cricopharyngeal relaxation Thin Cup Reduced cricopharyngeal relaxation Thin Straw Reduced cricopharyngeal relaxation Puree Reduced cricopharyngeal relaxation Mechanical Soft NT Regular Reduced cricopharyngeal relaxation Multi-consistency NT Pill Reduced cricopharyngeal relaxation Amelia H. Roddie Mc, CCC-SLP Speech Language Pathologist Wende Bushy 11/07/2021, 11:43 AM                     DG Chest 2 View  Result Date: 11/06/2021 CLINICAL DATA:  Shortness of breath. Cough. Hypertension. Ex-smoker. Prostate cancer. EXAM: CHEST - 2 VIEW COMPARISON:  03/02/2021 CT.  10/05/2020 plain film. FINDINGS: Numerous leads and wires project over the chest. Midline trachea. Mild cardiomegaly. Atherosclerosis in the transverse aorta. Tiny bilateral pleural effusions are most apparent on the lateral view. No pneumothorax. Interstitial edema is mild. Patchy bibasilar airspace disease. IMPRESSION: Mild cardiomegaly with congestive heart failure and small bilateral pleural effusions. Patchy bibasilar airspace disease, favoring atelectasis. Aortic Atherosclerosis (ICD10-I70.0). Electronically Signed   By: Abigail Miyamoto M.D.   On: 11/06/2021 12:39    Microbiology: Results for orders placed or performed during the hospital encounter of 11/06/21  Resp Panel by RT-PCR (Flu A&B, Covid) Anterior Nasal Swab     Status: None   Collection Time: 11/06/21 12:13 PM   Specimen: Anterior Nasal Swab  Result Value  Ref Range Status  SARS Coronavirus 2 by RT PCR NEGATIVE NEGATIVE Final    Comment: (NOTE) SARS-CoV-2 target nucleic acids are NOT DETECTED.  The SARS-CoV-2 RNA is generally detectable in upper respiratory specimens during the acute phase of infection. The lowest concentration of SARS-CoV-2 viral copies this assay can detect is 138 copies/mL. A negative result does not preclude SARS-Cov-2 infection and should not be used as the sole basis for treatment or other patient management decisions. A negative result may occur with  improper specimen collection/handling, submission of specimen other than nasopharyngeal swab, presence of viral mutation(s) within the areas targeted by this assay, and inadequate number of viral copies(<138 copies/mL). A negative result must be combined with clinical observations, patient history, and epidemiological information. The expected result is Negative.  Fact Sheet for Patients:  EntrepreneurPulse.com.au  Fact Sheet for Healthcare Providers:  IncredibleEmployment.be  This test is no t yet approved or cleared by the [place] FDA and  has been authorized for detection and/or diagnosis of SARS-CoV-2 by FDA under an Emergency Use Authorization (EUA). This EUA will remain  in effect (meaning this test can be used) for the duration of the COVID-19 declaration under Section 564(b)(1) of the Act, 21 U.S.C.section 360bbb-3(b)(1), unless the authorization is terminated  or revoked sooner.       Influenza A by PCR NEGATIVE NEGATIVE Final   Influenza B by PCR NEGATIVE NEGATIVE Final    Comment: (NOTE) The Xpert Xpress SARS-CoV-2/FLU/RSV plus assay is intended as an aid in the diagnosis of influenza from Nasopharyngeal swab specimens and should not be used as a sole basis for treatment. Nasal washings and aspirates are unacceptable for Xpert Xpress SARS-CoV-2/FLU/RSV testing.  Fact Sheet for  Patients: EntrepreneurPulse.com.au  Fact Sheet for Healthcare Providers: IncredibleEmployment.be  This test is not yet approved or cleared by the [place] FDA and has been authorized for detection and/or diagnosis of SARS-CoV-2 by FDA under an Emergency Use Authorization (EUA). This EUA will remain in effect (meaning this test can be used) for the duration of the COVID-19 declaration under Section 564(b)(1) of the Act, 21 U.S.C. section 360bbb-3(b)(1), unless the authorization is terminated or revoked.  Performed at [facility], [address] [place], [place] [address]     Labs: CBC: Recent Labs  Lab 11/06/21 1220 11/07/21 0410 11/08/21 0541  WBC 9.4 8.1 7.9  NEUTROABS 7.3  --   --   HGB 11.1* 11.2* 11.3*  HCT 34.9* 34.6* 35.5*  MCV 89.5 91.1 90.1  PLT 219 258 096   Basic Metabolic Panel: Recent Labs  Lab 11/06/21 1220 11/07/21 0410 11/08/21 0541 11/09/21 0557  NA 136 141 140 138  K 3.5 3.7 3.6 4.0  CL 106 105 108 105  CO2 '25 27 26 27  '$ GLUCOSE 116* 112* 109* 118*  BUN '11 17 17 19  '$ CREATININE 0.54* 0.78 0.66 0.82  CALCIUM 8.7* 8.6* 8.5* 8.5*  MG  --  1.7 2.1 2.0   Liver Function Tests: No results for input(s): "AST", "ALT", "ALKPHOS", "BILITOT", "PROT", "ALBUMIN" in the last 168 hours. CBG: Recent Labs  Lab 11/08/21 1159 11/08/21 1617 11/08/21 2029 11/09/21 0736 11/09/21 1138  GLUCAP 122* 100* 107* 114* 110*    Discharge time spent: greater than 30 minutes.  Signed: Orson Eva, MD [facility] 11/09/2021

## 2021-11-09 NOTE — Progress Notes (Signed)
Nsg Discharge Note  Admit Date:  11/06/2021 Discharge date: 11/09/2021   Troy [name] to be D/C'd Home per MD order.  AVS completed.  Patient/caregiver able to verbalize understanding.  Discharge Medication: Allergies as of 11/09/2021       Reactions   Niacin Itching, Rash   Burning sensation   Contrast Media [iodinated Contrast Media] Nausea And Vomiting   Iodine-131 Nausea And Vomiting   Nsaids Other (See Comments)   Taking Coumadin        Medication List     TAKE these medications    acetaminophen 325 MG tablet Commonly known as: TYLENOL Take 2 tablets (650 mg total) by mouth every 6 (six) hours as needed for mild pain, fever or headache (or Fever >/= 101).   albuterol 108 (90 Base) MCG/ACT inhaler Commonly known as: VENTOLIN HFA Inhale 2 puffs into the lungs every 6 (six) hours as needed for wheezing or shortness of breath.   amLODipine 10 MG tablet Commonly known as: NORVASC Take 10 mg by mouth daily.   aspirin EC 81 MG tablet Take 1 tablet (81 mg total) by mouth daily with breakfast.   atorvastatin 40 MG tablet Commonly known as: LIPITOR Take 1 tablet (40 mg total) by mouth daily.   famotidine 20 MG tablet Commonly known as: Pepcid One after supper   HYDROcodone-acetaminophen 5-325 MG tablet Commonly known as: NORCO/VICODIN Take 1 tablet by mouth daily as needed for moderate pain.   ipratropium 0.03 % nasal spray Commonly known as: ATROVENT Place 2 sprays into both nostrils as needed for rhinitis.   ipratropium-albuterol 0.5-2.5 (3) MG/3ML Soln Commonly known as: DUONEB Take 3 mLs by nebulization every 4 (four) hours as needed.   isosorbide mononitrate 60 MG 24 hr tablet Commonly known as: IMDUR Take 2 tablets (120 mg total) by mouth every evening.   losartan 25 MG tablet Commonly known as: COZAAR Take 1 tablet (25 mg total) by mouth daily.   metFORMIN 1000 MG tablet Commonly known as: GLUCOPHAGE Take 500 mg by mouth 2 (two) times daily with  a meal.   metoprolol succinate 25 MG 24 hr tablet Commonly known as: TOPROL-XL Take 1 tablet (25 mg total) by mouth daily. Pt. Needs to make an appt. With Cardiologist in order to receive further refills. Thank You. 1st Attempt.   mirabegron ER 25 MG Tb24 tablet Commonly known as: MYRBETRIQ Take 1 tablet (25 mg total) by mouth daily.   nitroGLYCERIN 0.4 MG SL tablet Commonly known as: NITROSTAT Place 1 tablet (0.4 mg total) under the tongue every 5 (five) minutes x 3 doses as needed for chest pain.   OXYGEN Inhale 3 L into the lungs at bedtime.   pantoprazole 40 MG tablet Commonly known as: PROTONIX Take 1 tablet (40 mg total) by mouth daily. Take 30-60 min before first meal of the day   potassium chloride 10 MEQ tablet Commonly known as: KLOR-CON M Take 1 tablet (10 mEq total) by mouth daily. Only take while taking Torsemide   tamsulosin 0.4 MG Caps capsule Commonly known as: FLOMAX Take 1 capsule (0.4 mg total) by mouth daily after supper.   torsemide 10 MG tablet Commonly known as: DEMADEX Take 1 tablet (10 mg total) by mouth every other day. Take 20 mg for weight gain over 3 lbs in 24 hours   warfarin 1 MG tablet Commonly known as: COUMADIN Take 1 mg by mouth every evening. Take 2 mg  every other day and 1 mg all others  Discharge Assessment: Vitals:   11/09/21 0438 11/09/21 0917  BP: 122/65 (!) 119/52  Pulse: 61 70  Resp: 18   Temp: (!) 97.1 F (36.2 C)   SpO2: 97%    Skin clean, dry and intact without evidence of skin break down, no evidence of skin tears noted. IV catheter discontinued intact. Site without signs and symptoms of complications - no redness or edema noted at insertion site, patient denies c/o pain - only slight tenderness at site.  Dressing with slight pressure applied.  D/c Instructions-Education: Discharge instructions given to patient/family with verbalized understanding. D/c education completed with patient/family including follow  up instructions, medication list, d/c activities limitations if indicated, with other d/c instructions as indicated by MD - patient able to verbalize understanding, all questions fully answered. Patient instructed to return to ED, call 911, or call MD for any changes in condition.  Patient escorted via [place], and D/C home via private auto.  Kathie Rhodes, RN 11/09/2021 11:40 AM

## 2021-11-09 NOTE — Telephone Encounter (Signed)
Patient called advising he needed more samples of Myrbetriq 25 mg. Patient advised he would be by 8/11 to pick up four boxes.

## 2021-11-09 NOTE — Progress Notes (Addendum)
ANTICOAGULATION CONSULT NOTE -   Pharmacy Consult for warfarin Indication: atrial fibrillation  Patient Measurements: Height: '5\' 10"'$  (177.8 cm) Weight: 89.9 kg (198 lb 3.1 oz) IBW/kg (Calculated) : 73  Labs: Recent Labs    11/06/21 1220 11/07/21 0410 11/08/21 0541 11/09/21 0557  HGB 11.1* 11.2* 11.3*  --   HCT 34.9* 34.6* 35.5*  --   PLT 219 258 227  --   LABPROT 16.2* 16.7* 16.7* 16.9*  INR 1.3* 1.4* 1.4* 1.4*  CREATININE 0.54* 0.78 0.66 0.82  TROPONINIHS 8  --   --   --     Estimated Creatinine Clearance: 83.8 mL/min (by C-G formula based on SCr of 0.82 mg/dL).  Assessment: 81 year old male hx Afib on chronic coumadin - home dose is '2mg'$  alternating with 1 mg. Admitted with a subtherapeutic INR, slow to rise. Continue gradual loading dose. No adverse events noted  CBC stable INR 1.3> 1.4>1.4>1.4  Goal of Therapy:  INR 2-3 Monitor platelets by anticoagulation protocol: Yes   Plan:  Warfarin '4mg'$  po x 1 Monitor daily INR and s/s of bleeding.  Lorenso Courier, PharmD Clinical Pharmacist 11/09/2021 8:00 AM

## 2021-11-09 NOTE — Progress Notes (Signed)
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  He had an episode of gross hematuria and recently followed up with urology Dr. McKenzie where he had cystoscopy performed.  His gross hematuria had resolved.     He was brought into the emergency department by [facility] for increasing shortness of breath for 1 day.  He says that he had normally uses oxygen only at night but yesterday started wearing it at all times and became shortness of breath with rest.  He denied having any chest pain.  He has had whitish sputum production and reported feeling overall weak.    PT Comments    Pt sitting on EOB and willing to participate with therapy today.  No reports of pain without O2 assistance with SpO2 range 95-98% through session on room air.  Pt able to ambulate further distance with RW, safe slow labored movements with no LOB.  Pt limited by fatigue and reports of unaware when needs to urinate.   EOS pt left sitting on EOB with call bell with reach.   Pt has met all acute care PT goals.     Recommendations for follow up therapy are one component of a multi-disciplinary discharge planning process, led by the attending physician.  Recommendations may be updated based on patient status, additional functional criteria and insurance authorization.  Follow Up Recommendations        Assistance  Recommended at Discharge    Patient can return home with the following A little help with walking and/or transfers;A little help with bathing/dressing/bathroom;Help with stairs or ramp for entrance;Assistance with cooking/housework   Equipment Recommendations       Recommendations for Other Services       Precautions / Restrictions Precautions Precautions: Fall Restrictions Weight Bearing Restrictions: No     Mobility  Bed Mobility Overal bed mobility: Independent             General bed mobility comments: Pt sitting on EOB when therapist arrived    Transfers Overall transfer level: Independent   Transfers: Sit to/from Stand Sit to Stand: Independent           General transfer comment: demonstrates safe mechanics with transfers, no cueing required    Ambulation/Gait Ambulation/Gait assistance: Supervision Gait Distance (Feet): 200 Feet Assistive device: Rolling walker (2 wheels) Gait Pattern/deviations: Decreased step length - right, Decreased step length - left, Decreased stride length       General Gait Details: slow labored cadence with no LOB, on room air with SpO2 range from 95-98% during gait.  Pt able to have conversation while walking with no c/o SOB.   Stairs             Wheelchair Mobility    Modified Rankin (Stroke Patients Only)       Balance                                              Cognition Arousal/Alertness: Awake/alert Behavior During Therapy: WFL for tasks assessed/performed Overall Cognitive Status: Within Functional Limits for tasks assessed                                          Exercises      General Comments        Pertinent Vitals/Pain Pain Assessment Pain Assessment: No/denies pain Pain Score: 1  Pain Location: low back Pain Descriptors / Indicators: Aching Pain Intervention(s): Monitored during session, Repositioned    Home Living                           Prior Function            PT Goals (current goals can now be found in the care plan section) Acute Rehab PT Goals Patient Stated Goal: return home with family to assist PT Goal Formulation: With patient Time For Goal Achievement: 11/09/21 Potential to Achieve Goals: Good Progress towards PT goals: Goals met/education completed, patient discharged from PT    Frequency           PT Plan      Co-evaluation              AM-PAC PT "6 Clicks" Mobility   Outcome Measure  Help needed turning from your back to your side while in a flat bed without using bedrails?: None Help needed moving from lying on your back to sitting on the side of a flat bed without using bedrails?: None Help needed moving to and from a bed to a chair (including a wheelchair)?: None Help needed standing up from a chair using your arms (e.g., wheelchair or bedside chair)?: None Help needed to walk in hospital room?: None Help needed climbing 3-5 steps with a railing? : A Little 6 Click Score: 23    End of Session Equipment Utilized During Treatment: Gait belt (room air) Activity Tolerance: Patient limited by fatigue;Patient tolerated treatment well Patient left: in chair (sitting on EOB) Nurse Communication: Mobility status PT Visit Diagnosis: Unsteadiness on feet (R26.81);Other abnormalities of gait and mobility (R26.89);Muscle weakness (generalized) (M62.81)     Time: 0935-1002 PT Time Calculation (min) (ACUTE ONLY): 27 min  Charges:  $Therapeutic Activity: 23-37 mins                    Casey Cockerham, LPTA/CLT; CBIS [phone]  Cockerham, Casey Jo 11/09/2021, 10:53 AM  

## 2021-11-09 NOTE — TOC Transition Note (Signed)
Transition of Care [facility]) - CM/SW Discharge Note   Patient Details  Name: Troy [name] MRN: [id number] Date of Birth: 1941/01/09  [facility] CM/SW Contact:  Shade Flood, LCSW Phone Number: 11/09/2021, 11:18 AM   Clinical Narrative:     Pt stable for dc home with [facility] today per MD. Updated Vaughan Basta at [facility] and they will follow after dc.   There are no other TOC needs for dc.  Final next level of care: [place] Barriers to Discharge: Barriers Resolved   Patient Goals and CMS Choice Patient states their goals for this hospitalization and ongoing recovery are:: home with [facility] CMS Medicare.gov Compare Post Acute Care list provided to:: Patient Choice offered to / list presented to : Patient  Discharge Placement                       Discharge Plan and Services In-house Referral: Clinical Social Work Discharge Planning Services: CM Consult                      HH Arranged: Therapist, sports, PT, Nurse's Aide, Social Work [employer] Agency: [place] ([place]) Date [place]: 11/08/21   Representative spoke with at [place]: [place] ([place]) Interventions     Readmission Risk Interventions    11/07/2021   12:48 PM  Readmission Risk Prevention Plan  Transportation Screening Complete  Home Care Screening Complete  Medication Review (RN CM) Complete

## 2021-11-10 ENCOUNTER — Ambulatory Visit: Payer: Medicare HMO | Admitting: Physician Assistant

## 2021-11-10 DIAGNOSIS — Z79899 Other long term (current) drug therapy: Secondary | ICD-10-CM | POA: Diagnosis not present

## 2021-11-13 ENCOUNTER — Ambulatory Visit (INDEPENDENT_AMBULATORY_CARE_PROVIDER_SITE_OTHER): Payer: No Typology Code available for payment source | Admitting: Physician Assistant

## 2021-11-13 VITALS — BP 111/53 | HR 59

## 2021-11-13 DIAGNOSIS — R339 Retention of urine, unspecified: Secondary | ICD-10-CM

## 2021-11-13 DIAGNOSIS — R8281 Pyuria: Secondary | ICD-10-CM

## 2021-11-13 DIAGNOSIS — R350 Frequency of micturition: Secondary | ICD-10-CM | POA: Diagnosis not present

## 2021-11-13 DIAGNOSIS — R8271 Bacteriuria: Secondary | ICD-10-CM

## 2021-11-13 LAB — BLADDER SCAN AMB NON-IMAGING: Scan Result: 0

## 2021-11-13 MED ORDER — CEPHALEXIN 500 MG PO CAPS: 500.0000 mg | ORAL_CAPSULE | Freq: Four times a day (QID) | ORAL | 0 refills | Status: AC

## 2021-11-13 MED ORDER — GEMTESA 75 MG PO TABS: 75.0000 mg | ORAL_TABLET | Freq: Every day | ORAL | 0 refills | Status: DC

## 2021-11-13 NOTE — Patient Instructions (Signed)
Stop Myrbetriq today and start Gemtesa tomorrow morning.

## 2021-11-13 NOTE — Progress Notes (Signed)
Assessment: 1. Urinary retention - BLADDER SCAN AMB NON-IMAGING - Urinalysis, Routine w reflex microscopic    Plan: ***  Chief Complaint: No chief complaint on file.   HPI: Troy [name] is a 81 y.o. male who presents for continued evaluation of urinary burning and incontinence.. Pt was Dc'd on 11/09/21 following admission for CHF tx with IV Lasix  10/27/21 Troy [name] is a 81yo here for evaluation of dysuria and urinary frequency. Last week he was admitted at the [facility] in [place] for dizziness and dehydration. His dysuria has improved over the past several weeks. He denies nay recent gross hematuria. He denies any recent UTI. No other complaints today     Portions of the above documentation were copied from a prior visit for review purposes only.  Allergies: Allergies  Allergen Reactions   Niacin Itching and Rash    Burning sensation   Contrast Media [Iodinated Contrast Media] Nausea And Vomiting   Iodine-131 Nausea And Vomiting   Nsaids Other (See Comments)    Taking Coumadin    PMH: Past Medical History:  Diagnosis Date   AAA (abdominal aortic aneurysm) (HCC)    Followed by Dr. Sherren Mocha Early   Arthritis    CAD (coronary artery disease)    a. CABG 2000.   Cancer [facility])    Prostate:  Radiation Tx   Carotid artery disease ([place])    Chest pain    precordial. mild chronic .[name]... nonischemic   CHF (congestive heart failure) (HCC)    Chronic edema    Coronary artery disease    a. Nuclear, January, 2008, no ischemia b. Cath 08/2012- 1/4 patent grafts, RCA CTO, no flow-limiting disease, medically managed   Diabetes mellitus without complication ([place])    Dizziness 02/2011   Dyslipidemia    Fall    GERD (gastroesophageal reflux disease)    TAKES TUMS & ROLAIDS AS NEEDED   Hx of CABG 2000   Hypertension    Myocardial infarction ([place]) 1999   Neck pain 02/2011   Pneumonia    Pulmonary hypertension ([place])    Renal artery stenosis (HCC)    50-70%   S/P femoropopliteal  bypass surgery    Dr. Donnetta Hutching   Sinus bradycardia    Asymptomatic    PSH: Past Surgical History:  Procedure Laterality Date   CARDIAC CATHETERIZATION  09/26/2012   1/4 patent bypass (occluded SVG-PDA, SVG-OM, LIMA-LAD), SVG-diagonal patent and fills the diagonal and LAD, distal RCA occlusion with left to right collateralization, patent circumflex, LAD with no flow-limiting disease and antegrade flow competitively from SVG-diagonal; EF 60-65%   COLONOSCOPY  11/30/2009   NLG:[id number]. next TCS 11/2019   COLONOSCOPY WITH PROPOFOL N/A 12/18/2019   Procedure: COLONOSCOPY WITH PROPOFOL;  Surgeon: Harvel Quale, MD;  Location[facility] ENDO [facility];  Service: Gastroenterology;  Laterality: N/A;  1030   CORONARY ARTERY BYPASS GRAFT  2000   CYSTOSCOPY N/A 08/31/2021   Procedure: CYSTOSCOPY;  Surgeon: Cleon Gustin, MD;  Location: [facility] ORS;  Service: Urology;  Laterality: N/A;  pt knows to arrive at 7:00   ESOPHAGOGASTRODUODENOSCOPY N/A 08/12/2013   [id number] peptic stricture with erosive refluxesophagitis - status post Maloney dilation. Hiatal hernia. Abnormalgastric mucosa. Deformity of the pyloric channel suggestive ofprior peptic ulcer disease. Duodenal bulbar diverticulum Statuspost gastric biopsy. h.pylori   LEFT HEART CATHETERIZATION WITH CORONARY/GRAFT ANGIOGRAM N/A 09/26/2012   Procedure: LEFT HEART CATHETERIZATION WITH Beatrix Fetters;  Surgeon: Burnell Blanks, MD;  Location: [facility] CATH LAB;  Service: Cardiovascular;  Laterality:  N/A;   Venia Minks DILATION N/A 08/12/2013   Procedure: Venia Minks DILATION;  Surgeon: Daneil Dolin, MD;  Location: [facility];  Service: Endoscopy;  Laterality: N/A;   POLYPECTOMY  12/18/2019   Procedure: POLYPECTOMY;  Surgeon: Harvel Quale, MD;  Location: [facility];  Service: Gastroenterology;;  ascending colon polyp    PR VEIN BYPASS GRAFT,AORTO-FEM-POP Right 07/19/1999   PR VEIN BYPASS GRAFT,AORTO-FEM-POP Left  05/02/2006   RIGHT HEART CATH AND CORONARY/GRAFT ANGIOGRAPHY N/A 04/11/2020   Procedure: RIGHT HEART CATH AND CORONARY/GRAFT ANGIOGRAPHY;  Surgeon: Larey Dresser, MD;  Location: [place] CV LAB;  Service: Cardiovascular;  Laterality: N/A;   SAVORY DILATION N/A 08/12/2013   Procedure: SAVORY DILATION;  Surgeon: Daneil Dolin, MD;  Location[facility];  Service: Endoscopy;  Laterality: N/A;   TRANSURETHRAL RESECTION OF BLADDER TUMOR N/A 08/31/2021   Procedure: TRANSURETHRAL RESECTION OF BLADDER TUMOR (TURBT);  Surgeon: Cleon Gustin, MD;  Location: [facility] ORS;  Service: Urology;  Laterality: N/A;   WRIST SURGERY     cyst removal    SH: Social History   Tobacco Use   Smoking status: Former    Packs/day: 2.00    Years: 70.00    Total pack years: 140.00    Types: Cigarettes    Quit date: 04/21/2018    Years since quitting: 3.5   Smokeless tobacco: Never  Vaping Use   Vaping Use: Never used  Substance Use Topics   Alcohol use: Yes    Alcohol/week: 0.0 - 2.0 standard drinks of alcohol    Comment: OCCASIONAL   Drug use: Never    ROS: All other review of systems were reviewed and are negative except what is noted above in HPI  PE: BP (!) 111/53   Pulse (!) 59  GENERAL APPEARANCE:  Well appearing, well developed, well nourished, NAD HEENT:  Atraumatic, normocephalic NECK:  Supple. Trachea midline ABDOMEN:  Soft, non-tender, no masses EXTREMITIES:  Moves all extremities well, without clubbing, cyanosis, or edema NEUROLOGIC:  Alert and oriented x 3, normal gait, CN II-XII grossly intact MENTAL STATUS:  appropriate BACK:  Non-tender to palpation, No CVAT SKIN:  Warm, dry, and intact   Results: Laboratory Data: Lab Results  Component Value Date   WBC 7.9 11/08/2021   HGB 11.3 (L) 11/08/2021   HCT 35.5 (L) 11/08/2021   MCV 90.1 11/08/2021   PLT 227 11/08/2021    Lab Results  Component Value Date   CREATININE 0.82 11/09/2021    No results found for:  "PSA"  No results found for: "TESTOSTERONE"  Lab Results  Component Value Date   HGBA1C 6.5 (H) 08/29/2021    Urinalysis    Component Value Date/Time   COLORURINE YELLOW 11/06/2021 1516   APPEARANCEUR CLEAR 11/06/2021 1516   APPEARANCEUR Clear 10/27/2021 1312   LABSPEC 1.008 11/06/2021 1516   PHURINE 8.0 11/06/2021 1516   GLUCOSEU NEGATIVE 11/06/2021 1516   HGBUR NEGATIVE 11/06/2021 1516   BILIRUBINUR NEGATIVE 11/06/2021 1516   BILIRUBINUR Negative 10/27/2021 1312   KETONESUR NEGATIVE 11/06/2021 1516   PROTEINUR NEGATIVE 11/06/2021 1516   NITRITE NEGATIVE 11/06/2021 1516   LEUKOCYTESUR NEGATIVE 11/06/2021 1516    Lab Results  Component Value Date   LABMICR See below: 10/27/2021   WBCUA >30 (A) 10/27/2021   LABEPIT 0-10 10/27/2021   MUCUS Present 10/27/2021   BACTERIA Few 10/27/2021    Pertinent Imaging: No results found for this or any previous visit.  No results found for this or  any previous visit.  No results found for this or any previous visit.  No results found for this or any previous visit.  No results found for this or any previous visit.  No results found for this or any previous visit.  Results for orders placed during the hospital encounter of 08/18/21  CT HEMATURIA WORKUP  Narrative CLINICAL DATA:  Gross hematuria since early May, history of prostate cancer * Tracking Code: BO *  EXAM: CT ABDOMEN AND PELVIS WITHOUT AND WITH CONTRAST  TECHNIQUE: Multidetector CT imaging of the abdomen and pelvis was performed following the standard protocol before and following the bolus administration of intravenous contrast.  RADIATION DOSE REDUCTION: This exam was performed according to the departmental dose-optimization program which includes automated exposure control, adjustment of the mA and/or kV according to patient size and/or use of iterative reconstruction technique.  CONTRAST:  130m OMNIPAQUE IOHEXOL 300 MG/ML  SOLN  COMPARISON:  None  Available.  FINDINGS: Lower chest: No acute abnormality. Cardiomegaly. Coronary artery calcifications.  Hepatobiliary: No solid liver abnormality is seen. Small, definitively benign cyst or hemangioma of the central liver dome for which no further follow-up or characterization is required (series 15, image 30). No gallstones, gallbladder wall thickening, or biliary dilatation.  Pancreas: Unremarkable. No pancreatic ductal dilatation or surrounding inflammatory changes.  Spleen: Normal in size without significant abnormality.  Adrenals/Urinary Tract: Adrenal glands are unremarkable. Small simple, benign bilateral renal cortical cysts, as well as intrinsically hyperdense, nonenhancing hemorrhagic or proteinaceous cysts of the midportions of the left and right kidneys (series 2, image 37). No urinary tract filling defect on delayed phase imaging. Bladder is unremarkable.  Stomach/Bowel: Stomach is within normal limits. Appendix appears normal. No evidence of bowel wall thickening, distention, or inflammatory changes. Descending and sigmoid diverticulosis.  Vascular/Lymphatic: Aortic atherosclerosis. 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 No enlarged abdominal or pelvic lymph nodes.  Reproductive: Prostate brachytherapy.  Other: Small, fat containing bilateral inguinal hernias. No ascites.  Musculoskeletal: No acute or significant osseous findings.  IMPRESSION: 1. No evidence of urinary tract mass, calculus, or hydronephrosis. No urinary tract filling defect on delayed phase imaging. 2. Prostate brachytherapy. No evidence of mass or lymphadenopathy in the abdomen or pelvis. 3. Small simple, benign  bilateral renal cortical cysts, as well as nonenhancing, benign hemorrhagic or proteinaceous cysts. No further follow-up or characterization is required for these benign renal cysts. 4. 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 Recommend referral to a vascular specialist. This recommendation follows ACR consensus guidelines: White Paper of the ACR Incidental Findings Committee II on Vascular Findings. J Am Coll Radiol 2013; 10:789-794. 5. Cardiomegaly and coronary artery disease.  Aortic Atherosclerosis (ICD10-I70.0).   Electronically Signed By: ADelanna AhmadiM.D. On: 08/19/2021 11:29  No results found for this or any previous visit.  Results for orders placed or performed in visit on 11/13/21 (from the past 24 hour(s))  BLADDER SCAN AMB NON-IMAGING   Collection Time: 11/13/21 11:02 AM  Result Value Ref  Range   Scan Result 0

## 2021-11-14 ENCOUNTER — Other Ambulatory Visit: Payer: Self-pay

## 2021-11-14 DIAGNOSIS — I714 Abdominal aortic aneurysm, without rupture, unspecified: Secondary | ICD-10-CM

## 2021-11-15 DIAGNOSIS — Z6831 Body mass index (BMI) 31.0-31.9, adult: Secondary | ICD-10-CM | POA: Diagnosis not present

## 2021-11-15 DIAGNOSIS — I509 Heart failure, unspecified: Secondary | ICD-10-CM | POA: Diagnosis not present

## 2021-11-15 DIAGNOSIS — E860 Dehydration: Secondary | ICD-10-CM | POA: Diagnosis not present

## 2021-11-15 DIAGNOSIS — I2510 Atherosclerotic heart disease of native coronary artery without angina pectoris: Secondary | ICD-10-CM | POA: Diagnosis not present

## 2021-11-15 LAB — MICROSCOPIC EXAMINATION

## 2021-11-15 LAB — URINALYSIS, ROUTINE W REFLEX MICROSCOPIC
Bilirubin, UA: NEGATIVE
Glucose, UA: NEGATIVE
Ketones, UA: NEGATIVE
Nitrite, UA: NEGATIVE
Specific Gravity, UA: 1.015 (ref 1.005–1.030)
Urobilinogen, Ur: 0.2 mg/dL (ref 0.2–1.0)
pH, UA: 6 (ref 5.0–7.5)

## 2021-11-15 LAB — URINE CULTURE: Organism ID, Bacteria: NO GROWTH

## 2021-11-16 ENCOUNTER — Telehealth: Payer: Self-pay

## 2021-11-16 NOTE — Telephone Encounter (Signed)
-----   Message from Reynaldo Minium, [place] sent at 11/16/2021  8:50 AM EDT ----- Please let the patient know that his urine culture is negative, but I recommend that he continue Keflex until completion. ----- Message ----- From: Interface, [facility] Lab Results In Sent: 11/15/2021   5:38 AM EDT To: Reynaldo Minium, PA-C

## 2021-11-16 NOTE — Telephone Encounter (Signed)
Patient called and made aware.

## 2021-11-28 ENCOUNTER — Encounter (HOSPITAL_COMMUNITY): Payer: Self-pay | Admitting: Emergency Medicine

## 2021-11-28 ENCOUNTER — Other Ambulatory Visit: Payer: Self-pay

## 2021-11-28 ENCOUNTER — Emergency Department (HOSPITAL_COMMUNITY): Payer: No Typology Code available for payment source

## 2021-11-28 ENCOUNTER — Inpatient Hospital Stay (HOSPITAL_COMMUNITY)
Admission: EM | Admit: 2021-11-28 | Discharge: 2021-12-02 | DRG: 728 | Disposition: A | Discharge status: Skilled Nursing Facility | Payer: No Typology Code available for payment source | Attending: Internal Medicine | Admitting: Internal Medicine

## 2021-11-28 DIAGNOSIS — R531 Weakness: Secondary | ICD-10-CM | POA: Diagnosis present

## 2021-11-28 DIAGNOSIS — I4821 Permanent atrial fibrillation: Secondary | ICD-10-CM | POA: Diagnosis present

## 2021-11-28 DIAGNOSIS — Z8546 Personal history of malignant neoplasm of prostate: Secondary | ICD-10-CM | POA: Diagnosis not present

## 2021-11-28 DIAGNOSIS — I5032 Chronic diastolic (congestive) heart failure: Secondary | ICD-10-CM

## 2021-11-28 DIAGNOSIS — Z888 Allergy status to other drugs, medicaments and biological substances status: Secondary | ICD-10-CM

## 2021-11-28 DIAGNOSIS — B9620 Unspecified Escherichia coli [E. coli] as the cause of diseases classified elsewhere: Secondary | ICD-10-CM | POA: Diagnosis present

## 2021-11-28 DIAGNOSIS — Z7901 Long term (current) use of anticoagulants: Secondary | ICD-10-CM

## 2021-11-28 DIAGNOSIS — I739 Peripheral vascular disease, unspecified: Secondary | ICD-10-CM | POA: Diagnosis present

## 2021-11-28 DIAGNOSIS — K5900 Constipation, unspecified: Secondary | ICD-10-CM | POA: Diagnosis present

## 2021-11-28 DIAGNOSIS — Z79899 Other long term (current) drug therapy: Secondary | ICD-10-CM

## 2021-11-28 DIAGNOSIS — Z801 Family history of malignant neoplasm of trachea, bronchus and lung: Secondary | ICD-10-CM

## 2021-11-28 DIAGNOSIS — Z923 Personal history of irradiation: Secondary | ICD-10-CM

## 2021-11-28 DIAGNOSIS — I4819 Other persistent atrial fibrillation: Secondary | ICD-10-CM

## 2021-11-28 DIAGNOSIS — D6859 Other primary thrombophilia: Secondary | ICD-10-CM | POA: Diagnosis present

## 2021-11-28 DIAGNOSIS — Z7982 Long term (current) use of aspirin: Secondary | ICD-10-CM

## 2021-11-28 DIAGNOSIS — E1151 Type 2 diabetes mellitus with diabetic peripheral angiopathy without gangrene: Secondary | ICD-10-CM | POA: Diagnosis present

## 2021-11-28 DIAGNOSIS — Z7984 Long term (current) use of oral hypoglycemic drugs: Secondary | ICD-10-CM

## 2021-11-28 DIAGNOSIS — K219 Gastro-esophageal reflux disease without esophagitis: Secondary | ICD-10-CM | POA: Diagnosis present

## 2021-11-28 DIAGNOSIS — I701 Atherosclerosis of renal artery: Secondary | ICD-10-CM | POA: Diagnosis present

## 2021-11-28 DIAGNOSIS — N452 Orchitis: Secondary | ICD-10-CM | POA: Diagnosis not present

## 2021-11-28 DIAGNOSIS — E785 Hyperlipidemia, unspecified: Secondary | ICD-10-CM | POA: Diagnosis present

## 2021-11-28 DIAGNOSIS — I4891 Unspecified atrial fibrillation: Secondary | ICD-10-CM | POA: Diagnosis present

## 2021-11-28 DIAGNOSIS — N453 Epididymo-orchitis: Secondary | ICD-10-CM | POA: Diagnosis not present

## 2021-11-28 DIAGNOSIS — D6869 Other thrombophilia: Secondary | ICD-10-CM | POA: Diagnosis present

## 2021-11-28 DIAGNOSIS — Z803 Family history of malignant neoplasm of breast: Secondary | ICD-10-CM

## 2021-11-28 DIAGNOSIS — I110 Hypertensive heart disease with heart failure: Secondary | ICD-10-CM | POA: Diagnosis present

## 2021-11-28 DIAGNOSIS — R1314 Dysphagia, pharyngoesophageal phase: Secondary | ICD-10-CM | POA: Diagnosis present

## 2021-11-28 DIAGNOSIS — I10 Essential (primary) hypertension: Secondary | ICD-10-CM | POA: Diagnosis not present

## 2021-11-28 DIAGNOSIS — Z83438 Family history of other disorder of lipoprotein metabolism and other lipidemia: Secondary | ICD-10-CM

## 2021-11-28 DIAGNOSIS — Z8249 Family history of ischemic heart disease and other diseases of the circulatory system: Secondary | ICD-10-CM

## 2021-11-28 DIAGNOSIS — Z87891 Personal history of nicotine dependence: Secondary | ICD-10-CM

## 2021-11-28 DIAGNOSIS — I2510 Atherosclerotic heart disease of native coronary artery without angina pectoris: Secondary | ICD-10-CM | POA: Diagnosis present

## 2021-11-28 DIAGNOSIS — Z833 Family history of diabetes mellitus: Secondary | ICD-10-CM

## 2021-11-28 DIAGNOSIS — I861 Scrotal varices: Secondary | ICD-10-CM | POA: Diagnosis not present

## 2021-11-28 DIAGNOSIS — Z885 Allergy status to narcotic agent status: Secondary | ICD-10-CM

## 2021-11-28 DIAGNOSIS — Z91041 Radiographic dye allergy status: Secondary | ICD-10-CM

## 2021-11-28 DIAGNOSIS — J9611 Chronic respiratory failure with hypoxia: Secondary | ICD-10-CM | POA: Diagnosis present

## 2021-11-28 DIAGNOSIS — I272 Pulmonary hypertension, unspecified: Secondary | ICD-10-CM | POA: Diagnosis present

## 2021-11-28 DIAGNOSIS — R609 Edema, unspecified: Secondary | ICD-10-CM | POA: Diagnosis not present

## 2021-11-28 DIAGNOSIS — N433 Hydrocele, unspecified: Secondary | ICD-10-CM | POA: Diagnosis not present

## 2021-11-28 DIAGNOSIS — N448 Other noninflammatory disorders of the testis: Secondary | ICD-10-CM | POA: Diagnosis not present

## 2021-11-28 DIAGNOSIS — I252 Old myocardial infarction: Secondary | ICD-10-CM

## 2021-11-28 DIAGNOSIS — I25810 Atherosclerosis of coronary artery bypass graft(s) without angina pectoris: Secondary | ICD-10-CM | POA: Diagnosis present

## 2021-11-28 DIAGNOSIS — I714 Abdominal aortic aneurysm, without rupture, unspecified: Secondary | ICD-10-CM | POA: Diagnosis present

## 2021-11-28 LAB — URINALYSIS, ROUTINE W REFLEX MICROSCOPIC
Bilirubin Urine: NEGATIVE
Glucose, UA: NEGATIVE mg/dL
Ketones, ur: NEGATIVE mg/dL
Nitrite: POSITIVE — AB
Protein, ur: 100 mg/dL — AB
Specific Gravity, Urine: 1.012 (ref 1.005–1.030)
WBC, UA: >50 WBC/hpf — ABNORMAL HIGH (ref 0–5)
pH: 7 (ref 5.0–8.0)

## 2021-11-28 LAB — CBC WITH DIFFERENTIAL/PLATELET
Abs Immature Granulocytes: 0.04 10*3/uL (ref 0.00–0.07)
Basophils Absolute: 0.1 10*3/uL (ref 0.0–0.1)
Basophils Relative: 1 %
Eosinophils Absolute: 0.1 10*3/uL (ref 0.0–0.5)
Eosinophils Relative: 1 %
HCT: 36.3 % — ABNORMAL LOW (ref 39.0–52.0)
Hemoglobin: 11.5 g/dL — ABNORMAL LOW (ref 13.0–17.0)
Immature Granulocytes: 0 %
Lymphocytes Relative: 8 %
Lymphs Abs: 0.9 10*3/uL (ref 0.7–4.0)
MCH: 28.6 pg (ref 26.0–34.0)
MCHC: 31.7 g/dL (ref 30.0–36.0)
MCV: 90.3 fL (ref 80.0–100.0)
Monocytes Absolute: 1 10*3/uL (ref 0.1–1.0)
Monocytes Relative: 9 %
Neutro Abs: 9.8 10*3/uL — ABNORMAL HIGH (ref 1.7–7.7)
Neutrophils Relative %: 81 %
Platelets: 194 10*3/uL (ref 150–400)
RBC: 4.02 MIL/uL — ABNORMAL LOW (ref 4.22–5.81)
RDW: 16.4 % — ABNORMAL HIGH (ref 11.5–15.5)
WBC: 12 10*3/uL — ABNORMAL HIGH (ref 4.0–10.5)
nRBC: 0 % (ref 0.0–0.2)

## 2021-11-28 LAB — GLUCOSE, CAPILLARY
Glucose-Capillary: 114 mg/dL — ABNORMAL HIGH (ref 70–99)
Glucose-Capillary: 153 mg/dL — ABNORMAL HIGH (ref 70–99)
Glucose-Capillary: 124 mg/dL — ABNORMAL HIGH (ref 70–99)
Glucose-Capillary: 153 mg/dL — ABNORMAL HIGH (ref 70–99)

## 2021-11-28 LAB — BASIC METABOLIC PANEL
Anion gap: 10 (ref 5–15)
BUN: 8 mg/dL (ref 8–23)
CO2: 24 mmol/L (ref 22–32)
Calcium: 8.7 mg/dL — ABNORMAL LOW (ref 8.9–10.3)
Chloride: 104 mmol/L (ref 98–111)
Creatinine, Ser: 0.77 mg/dL (ref 0.61–1.24)
GFR, Estimated: >60 mL/min (ref >60)
Glucose, Bld: 126 mg/dL — ABNORMAL HIGH (ref 70–99)
Potassium: 3.6 mmol/L (ref 3.5–5.1)
Sodium: 138 mmol/L (ref 135–145)

## 2021-11-28 LAB — PROTIME-INR
INR: 3.1 — ABNORMAL HIGH (ref 0.8–1.2)
Prothrombin Time: 31.3 seconds — ABNORMAL HIGH (ref 11.4–15.2)

## 2021-11-28 MED ORDER — TAMSULOSIN HCL 0.4 MG PO CAPS
0.4000 mg | ORAL_CAPSULE | Freq: Every day | ORAL | Status: DC
  Administered 2021-11-28 – 2021-12-01 (×4): 0.4 mg via ORAL
  Filled 2021-11-28 (×4): qty 1

## 2021-11-28 MED ORDER — PIPERACILLIN-TAZOBACTAM 3.375 G IVPB 30 MIN
3.3750 g | Freq: Once | INTRAVENOUS | Status: AC
  Administered 2021-11-28: 3.375 g via INTRAVENOUS
  Filled 2021-11-28 (×2): qty 50

## 2021-11-28 MED ORDER — TORSEMIDE 20 MG PO TABS
10.0000 mg | ORAL_TABLET | ORAL | Status: DC
  Administered 2021-11-29 – 2021-12-01 (×2): 10 mg via ORAL
  Filled 2021-11-28 (×2): qty 1

## 2021-11-28 MED ORDER — MIRABEGRON ER 25 MG PO TB24
25.0000 mg | ORAL_TABLET | Freq: Every day | ORAL | Status: DC
  Administered 2021-11-28 – 2021-12-02 (×5): 25 mg via ORAL
  Filled 2021-11-28 (×5): qty 1

## 2021-11-28 MED ORDER — AMLODIPINE BESYLATE 5 MG PO TABS
10.0000 mg | ORAL_TABLET | Freq: Every day | ORAL | Status: DC
  Administered 2021-11-28 – 2021-12-02 (×5): 10 mg via ORAL
  Filled 2021-11-28 (×5): qty 2

## 2021-11-28 MED ORDER — ACETAMINOPHEN 650 MG RE SUPP: 650.0000 mg | Freq: Four times a day (QID) | RECTAL | Status: DC | PRN

## 2021-11-28 MED ORDER — PIPERACILLIN-TAZOBACTAM 3.375 G IVPB
3.3750 g | Freq: Three times a day (TID) | INTRAVENOUS | Status: DC
  Administered 2021-11-28 – 2021-12-01 (×9): 3.375 g via INTRAVENOUS
  Filled 2021-11-28 (×9): qty 50

## 2021-11-28 MED ORDER — LOSARTAN POTASSIUM 50 MG PO TABS
25.0000 mg | ORAL_TABLET | Freq: Every day | ORAL | Status: DC
  Administered 2021-11-28 – 2021-12-02 (×5): 25 mg via ORAL
  Filled 2021-11-28 (×5): qty 1

## 2021-11-28 MED ORDER — FENTANYL CITRATE PF 50 MCG/ML IJ SOSY
50.0000 ug | PREFILLED_SYRINGE | Freq: Once | INTRAMUSCULAR | Status: AC
  Administered 2021-11-28: 50 ug via INTRAVENOUS
  Filled 2021-11-28: qty 1

## 2021-11-28 MED ORDER — OXYCODONE HCL 5 MG PO TABS
5.0000 mg | ORAL_TABLET | Freq: Four times a day (QID) | ORAL | Status: DC | PRN
  Administered 2021-11-28 – 2021-12-02 (×9): 5 mg via ORAL
  Filled 2021-11-28 (×10): qty 1

## 2021-11-28 MED ORDER — FAMOTIDINE 20 MG PO TABS
20.0000 mg | ORAL_TABLET | Freq: Every day | ORAL | Status: DC
  Administered 2021-11-28 – 2021-12-02 (×5): 20 mg via ORAL
  Filled 2021-11-28 (×5): qty 1

## 2021-11-28 MED ORDER — ASPIRIN 81 MG PO TBEC
81.0000 mg | DELAYED_RELEASE_TABLET | Freq: Every day | ORAL | Status: DC
  Administered 2021-11-29 – 2021-12-02 (×4): 81 mg via ORAL
  Filled 2021-11-28 (×4): qty 1

## 2021-11-28 MED ORDER — IPRATROPIUM-ALBUTEROL 0.5-2.5 (3) MG/3ML IN SOLN: 3.0000 mL | RESPIRATORY_TRACT | Status: DC | PRN

## 2021-11-28 MED ORDER — METOPROLOL SUCCINATE ER 25 MG PO TB24
25.0000 mg | ORAL_TABLET | Freq: Every day | ORAL | Status: DC
  Administered 2021-11-28 – 2021-12-02 (×4): 25 mg via ORAL
  Filled 2021-11-28 (×5): qty 1

## 2021-11-28 MED ORDER — FENTANYL CITRATE PF 50 MCG/ML IJ SOSY
25.0000 ug | PREFILLED_SYRINGE | Freq: Once | INTRAMUSCULAR | Status: AC
  Administered 2021-11-28: 25 ug via INTRAVENOUS
  Filled 2021-11-28: qty 1

## 2021-11-28 MED ORDER — ONDANSETRON HCL 4 MG/2ML IJ SOLN: 4.0000 mg | Freq: Four times a day (QID) | INTRAMUSCULAR | Status: DC | PRN

## 2021-11-28 MED ORDER — PANTOPRAZOLE SODIUM 40 MG PO TBEC
40.0000 mg | DELAYED_RELEASE_TABLET | Freq: Every day | ORAL | Status: DC
  Administered 2021-11-29 – 2021-12-02 (×4): 40 mg via ORAL
  Filled 2021-11-28 (×4): qty 1

## 2021-11-28 MED ORDER — LEVOFLOXACIN IN D5W 500 MG/100ML IV SOLN
500.0000 mg | Freq: Once | INTRAVENOUS | Status: AC
  Administered 2021-11-28: 500 mg via INTRAVENOUS
  Filled 2021-11-28: qty 100

## 2021-11-28 MED ORDER — ATORVASTATIN CALCIUM 40 MG PO TABS
40.0000 mg | ORAL_TABLET | Freq: Every day | ORAL | Status: DC
  Administered 2021-11-28 – 2021-12-02 (×5): 40 mg via ORAL
  Filled 2021-11-28 (×5): qty 1

## 2021-11-28 MED ORDER — ONDANSETRON HCL 4 MG PO TABS: 4.0000 mg | ORAL_TABLET | Freq: Four times a day (QID) | ORAL | Status: DC | PRN

## 2021-11-28 MED ORDER — ISOSORBIDE MONONITRATE ER 60 MG PO TB24
120.0000 mg | ORAL_TABLET | Freq: Every evening | ORAL | Status: DC
  Administered 2021-11-28 – 2021-12-01 (×4): 120 mg via ORAL
  Filled 2021-11-28 (×4): qty 2

## 2021-11-28 MED ORDER — ACETAMINOPHEN 325 MG PO TABS
650.0000 mg | ORAL_TABLET | Freq: Four times a day (QID) | ORAL | Status: DC | PRN
  Administered 2021-11-30: 650 mg via ORAL
  Filled 2021-11-28: qty 2

## 2021-11-28 NOTE — ED Triage Notes (Signed)
Pt c/o left testicle pain and swelling. He states he is having problems urinating.

## 2021-11-28 NOTE — H&P (Signed)
History and Physical    Patient: Troy [name]:[id number] DOB: [birth year] DOA: 11/28/2021 DOS: the patient was seen and examined on 11/28/2021 PCP: Sharilyn Sites, MD  Patient coming from: Home  Chief Complaint:  Chief Complaint  Patient presents with   Testicle Pain   HPI: Troy [name] is a 81 year old male with a history of coronary artery disease, permanent atrial fibrillation on warfarin, HFpEF, severe pulmonary hypertension, diabetes mellitus type 2, renal artery stenosis, hyperlipidemia, hypertension, esophageal dysphagia, peripheral vascular disease, chronic respiratory failure on nighttime oxygen presenting with 1 day history of left-sided testicular pain and edema.  The patient was recently discharged from the hospital after a stay from 11/06/2021 to 11/09/2021 for acute on chronic diastolic CHF.  He was discharged home on his previous dose of torsemide 10 mg every 48 hours.  His discharge weight was 198.2 pounds.  The patient followed up in the urology clinic 11/13/2021.  For his chronic urinary frequency.  The patient was started on cephalexin which she states he has finished.  He is not exactly sure which that he finished.  Nevertheless, the patient had been doing fairly well until he noticed increasing pain and edema on the evening of 11/27/2021 in his left testicle.  He states that it progressed to the point where he was having difficulty standing up and walking.  He lives by himself.  He denies any recent injury or trauma.  He has not had fevers, chills, nausea, vomiting or diarrhea abdominal pain.  He states that lower extremity edema is about the same as usual.  He denies any chest pain or shortness of breath, coughing, hemoptysis. In the ED, the patient was afebrile and hemodynamically stable with oxygen saturation 97% on room air.  WBC 12.0, hemoglobin 11.5, platelets 194,000.  Sodium 138, potassium 3.6, bicarbonate 24, BUN 8, serum creatinine 0.77.  Ultrasound of the scrotum was  negative for torsion but showed asymmetric enlargement of the left testicle and left epididymitis.  The patient was given a dose of levofloxacin.  Review of Systems: As mentioned in the history of present illness. All other systems reviewed and are negative. Past Medical History:  Diagnosis Date   AAA (abdominal aortic aneurysm) ([place])    Followed by Dr. Sherren Mocha Early   Arthritis    CAD (coronary artery disease)    a. CABG 2000.   Cancer [facility])    Prostate:  Radiation Tx   Carotid artery disease ([place])    Chest pain    precordial. mild chronic .[name]... nonischemic   CHF (congestive heart failure) (H[facility])    Chronic edema    Coronary artery disease    a. Nuclear, January, 2008, no ischemia b. Cath 08/2012- 1/4 patent grafts, RCA CTO, no flow-limiting disease, medically managed   Diabetes mellitus without complication ([place])    Dizziness 02/2011   Dyslipidemia    Fall    GERD (gastroesophageal reflux disease)    TAKES TUMS & ROLAIDS AS NEEDED   Hx of CABG 2000   Hypertension    Myocardial infarction ([place]) 1999   Neck pain 02/2011   Pneumonia    Pulmonary hypertension ([place])    Renal artery stenosis (HCC)    50-70%   S/P femoropopliteal bypass surgery    Dr. Donnetta Hutching   Sinus bradycardia    Asymptomatic   Past Surgical History:  Procedure Laterality Date   CARDIAC CATHETERIZATION  09/26/2012   1/4 patent bypass (occluded SVG-PDA, SVG-OM, LIMA-LAD), SVG-diagonal patent and fills the diagonal and  LAD, distal RCA occlusion with left to right collateralization, patent circumflex, LAD with no flow-limiting disease and antegrade flow competitively from SVG-diagonal; EF 60-65%   COLONOSCOPY  11/30/2009   HYW:[id number]. next TCS 11/2019   COLONOSCOPY WITH PROPOFOL N/A 12/18/2019   Procedure: COLONOSCOPY WITH PROPOFOL;  Surgeon: Harvel Quale, MD;  [facility];  Service: Gastroenterology;  Laterality: N/A;  1030   CORONARY ARTERY BYPASS GRAFT  2000   CYSTOSCOPY N/A 08/31/2021    Procedure: CYSTOSCOPY;  Surgeon: Cleon Gustin, MD;  Location: [facility] ORS;  Service: Urology;  Laterality: N/A;  pt knows to arrive at 7:00   ESOPHAGOGASTRODUODENOSCOPY N/A 08/12/2013   EVO:[id number] peptic stricture with erosive refluxesophagitis - status post Maloney dilation. Hiatal hernia. Abnormalgastric mucosa. Deformity of the pyloric channel suggestive ofprior peptic ulcer disease. Duodenal bulbar diverticulum Statuspost gastric biopsy. h.pylori   LEFT HEART CATHETERIZATION WITH CORONARY/GRAFT ANGIOGRAM N/A 09/26/2012   Procedure: LEFT HEART CATHETERIZATION WITH Beatrix Fetters;  Surgeon: Burnell Blanks, MD;  Location: [facility] CATH LAB;  Service: Cardiovascular;  Laterality: N/A;   MALONEY DILATION N/A 08/12/2013   Procedure: Venia Minks DILATION;  Surgeon: Daneil Dolin, MD;  Location: [facility];  Service: Endoscopy;  Laterality: N/A;   POLYPECTOMY  12/18/2019   Procedure: POLYPECTOMY;  Surgeon: Harvel Quale, MD;  Location: [facility];  Service: Gastroenterology;;  ascending colon polyp    PR VEIN BYPASS GRAFT,AORTO-FEM-POP Right 07/19/1999   PR VEIN BYPASS GRAFT,AORTO-FEM-POP Left 05/02/2006   RIGHT HEART CATH AND CORONARY/GRAFT ANGIOGRAPHY N/A 04/11/2020   Procedure: RIGHT HEART CATH AND CORONARY/GRAFT ANGIOGRAPHY;  Surgeon: Larey Dresser, MD;  Location: [facility];  Service: Cardiovascular;  Laterality: N/A;   SAVORY DILATION N/A 08/12/2013   Procedure: SAVORY DILATION;  Surgeon: Daneil Dolin, MD;  Location[facility];  Service: Endoscopy;  Laterality: N/A;   TRANSURETHRAL RESECTION OF BLADDER TUMOR N/A 08/31/2021   Procedure: TRANSURETHRAL RESECTION OF BLADDER TUMOR (TURBT);  Surgeon: Cleon Gustin, MD;  Location: [facility];  Service: Urology;  Laterality: N/A;   WRIST SURGERY     cyst removal   Social History:  reports that he quit smoking about 3 years ago. His smoking use included cigarettes. He has a 140.00 pack-year smoking  history. He has never used smokeless tobacco. He reports current alcohol use. He reports that he does not use drugs.  Allergies  Allergen Reactions   Niacin Itching and Rash    Burning sensation   Contrast Media [Iodinated Contrast Media] Nausea And Vomiting   Iodine-131 Nausea And Vomiting   Nsaids Other (See Comments)    Taking Coumadin    Family History  Problem Relation Age of Onset   Deep vein thrombosis Father    Lung cancer Sister    Diabetes Sister    Heart disease Sister        After age 65   Hyperlipidemia Sister    Hypertension Sister    Lung cancer Sister    Breast cancer Sister    Hypertension Mother    Diabetes Sister    Coronary artery disease Other        family hx of   Cancer Brother        "crab cancer"   Heart disease Brother    Heart attack Brother    Heart attack Daughter    Colon cancer Neg Hx    Stroke Neg Hx     Prior to Admission medications   Medication Sig  Start Date End Date Taking? Authorizing Provider  acetaminophen (TYLENOL) 325 MG tablet Take 2 tablets (650 mg total) by mouth every 6 (six) hours as needed for mild pain, fever or headache (or Fever >/= 101). 03/29/20   Roxan Hockey, MD  albuterol (VENTOLIN HFA) 108 (90 Base) MCG/ACT inhaler Inhale 2 puffs into the lungs every 6 (six) hours as needed for wheezing or shortness of breath. 03/29/20   Roxan Hockey, MD  amLODipine (NORVASC) 10 MG tablet Take 10 mg by mouth daily.    [provider]  aspirin 81 MG EC tablet Take 1 tablet (81 mg total) by mouth daily with breakfast. 03/29/20   Denton Brick, Courage, MD  atorvastatin (LIPITOR) 40 MG tablet Take 1 tablet (40 mg total) by mouth daily. 11/30/13   Carlena Bjornstad, MD  famotidine (PEPCID) 20 MG tablet One after supper 06/07/20   Tanda Rockers, MD  HYDROcodone-acetaminophen (NORCO/VICODIN) 5-325 MG tablet Take 1 tablet by mouth daily as needed for moderate pain. 08/31/21   McKenzie, Candee Furbish, MD  ipratropium (ATROVENT) 0.03 %  nasal spray Place 2 sprays into both nostrils as needed for rhinitis.    [provider]  ipratropium-albuterol (DUONEB) 0.5-2.5 (3) MG/3ML SOLN Take 3 mLs by nebulization every 4 (four) hours as needed. 03/29/20   Roxan Hockey, MD  isosorbide mononitrate (IMDUR) 60 MG 24 hr tablet Take 2 tablets (120 mg total) by mouth every evening. 04/11/20   Larey Dresser, MD  losartan (COZAAR) 25 MG tablet Take 1 tablet (25 mg total) by mouth daily. 03/29/20   Roxan Hockey, MD  metFORMIN (GLUCOPHAGE) 1000 MG tablet Take 500 mg by mouth 2 (two) times daily with a meal.    [provider]  metoprolol succinate (TOPROL-XL) 25 MG 24 hr tablet Take 1 tablet (25 mg total) by mouth daily. Pt. Needs to make an appt. With Cardiologist in order to receive further refills. Thank You. 1st Attempt. 05/15/21   Dunn, Nedra Hai, PA-C  nitroGLYCERIN (NITROSTAT) 0.4 MG SL tablet Place 1 tablet (0.4 mg total) under the tongue every 5 (five) minutes x 3 doses as needed for chest pain. 12/14/19   Dunn, Nedra Hai, PA-C  OXYGEN Inhale 3 L into the lungs at bedtime.    [provider]  pantoprazole (PROTONIX) 40 MG tablet Take 1 tablet (40 mg total) by mouth daily. Take 30-60 min before first meal of the day 06/07/20   Tanda Rockers, MD  potassium chloride (KLOR-CON) 10 MEQ tablet Take 1 tablet (10 mEq total) by mouth daily. Only take while taking Torsemide 04/04/20   Larey Dresser, MD  tamsulosin (FLOMAX) 0.4 MG CAPS capsule Take 1 capsule (0.4 mg total) by mouth daily after supper. 10/27/21   McKenzie, Candee Furbish, MD  torsemide (DEMADEX) 10 MG tablet Take 1 tablet (10 mg total) by mouth every other day. Take 20 mg for weight gain over 3 lbs in 24 hours 04/13/20   Larey Dresser, MD  Vibegron (GEMTESA) 75 MG TABS Take 75 mg by mouth daily. 11/13/21   Summerlin, Berneice Heinrich, PA-C  warfarin (COUMADIN) 1 MG tablet Take 1 mg by mouth every evening. Take 2 mg  every other day and 1 mg all others 09/27/12    Meriel Pica, PA-C    Physical Exam: Vitals:   11/28/21 0348 11/28/21 0351 11/28/21 0600 11/28/21 0700  BP:  136/69 (!) 140/60 (!) 146/76  Pulse:  65 (!) 53 (!) 53  Resp:  20  18 17  Temp:  98.1 F (36.7 C) 98.2 F (36.8 C) 98.3 F (36.8 C)  TempSrc:    Oral  SpO2:  95% 95% 97%  Weight: 89.9 kg     Height: '5\' 10"'$  (1.778 m)      GENERAL:  A&O x 3, NAD, well developed, cooperative, follows commands HEENT: [place]/AT, No thrush, No icterus, No oral ulcers Neck:  No neck mass, No meningismus, soft, supple CV: RRR, no S3, no S4, no rub, no JVD Lungs: Fine bibasilar rales but no wheezing.  Good air movement Abd: soft/NT +BS, nondistended Ext: trace LE edema, no lymphangitis, no cyanosis, no rashes Neuro:  CN II-XII intact, strength 4/5 in RUE, RLE, strength 4/5 LUE, LLE; sensation intact bilateral; no dysmetria; babinski equivocal  Data Reviewed: Data reviewed above in the history  Assessment and Plan: Acute epididymoorchitis -The patient has had numerous recent antibiotic exposure predisposing him to MDR organism -Start Zosyn -Trying to avoid quinolones at this time given drug interaction with warfarin -Follow urine culture -UA> 50 WBC -Pending urine culture May consider discharge home with Augmentin  chronic HFpEF Appears clinically euvolemic Discharge weight 198.2 lbs on 11/09/21 Daily weight`` restartPTA torsemide dose 10 mg qod   Severe Pulmonary Hypertension  - Pt is being followed by advanced HF clinic -Moderate PH on RHC in 1/22 - 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  - Dr. Aundra Dubin outpatient follow up after discharge    Permanent Atrial Fibrillation - HR well controlled, pt is anticoagulated with warfarin -Pharmacy consult for warfarin management in hospital -Following daily PT/INR   Subtherapeutic INR -PharmD has been assisting with dosing during hospitalization -pt instructed to take warfarin 4 mg today -pt states he will get his INR checked on 11/10/21 at PCP  office   Hyperlipidemia - resumed home statin   Type 2 diabetes mellitus, controlled with vascular complications  -SSI coverage and frequent CBG monitoring in addition to prandial NovoLog ordered -hold metformin - 08/29/21 A1C--6.5   Esophageal dysphagia  -Patient reports that he is scheduled to have a barium swallow evaluation at [facility] on 8/8.  -SLP saw pt last admission -SLP recommends regular diet with thin liquids   CAD s/p CABG at age 47 - he is being medically managed now and we have resumed his cardiac medications - Cath in 1/22 showed patent LIMA-LAD and SVG-D but occluded SVG-OM and SVG-RCA -continue imdur and metoprolol succinate and atorava   chronic respiratory failure with hypoxia -on 3L at night at home   Essential hypertension  - resuming home medication and follow closely    PVD - severe  - he has had multiple bypass surgeries - resume medical management  - Occluded right fem-pop bypass.  No claudication - continue ASA     Advance Care Planning: FULL CODE  Consults: none  Family Communication: none  Severity of Illness: The appropriate patient status for this patient is OBSERVATION. Observation status is judged to be reasonable and necessary in order to provide the required intensity of service to ensure the patient's safety. The patient's presenting symptoms, physical exam findings, and initial radiographic and laboratory data in the context of their medical condition is felt to place them at decreased risk for further clinical deterioration. Furthermore, it is anticipated that the patient will be medically stable for discharge from the hospital within 2 midnights of admission.   Author: Orson Eva, MD 11/28/2021 7:45 AM  For on call review [email or web address].

## 2021-11-28 NOTE — Progress Notes (Addendum)
ANTICOAGULATION CONSULT NOTE - Initial Consult  Pharmacy Consult for Warfarin Indication: atrial fibrillation  Allergies  Allergen Reactions   Niacin Itching and Rash    Burning sensation   Contrast Media [Iodinated Contrast Media] Nausea And Vomiting   Iodine-131 Nausea And Vomiting   Nsaids Other (See Comments)    Taking Coumadin    Patient Measurements: Height: '5\' 10"'$  (177.8 cm) Weight: 89.9 kg (198 lb 3.1 oz) IBW/kg (Calculated) : 73  Vital Signs: Temp: 98.2 F (36.8 C) (08/29 0800) Temp Source: Oral (08/29 0800) BP: 131/58 (08/29 0800) Pulse Rate: 56 (08/29 0800)  Labs: Recent Labs    11/28/21 0357  HGB 11.5*  HCT 36.3*  PLT 194  LABPROT 31.3*  INR 3.1*  CREATININE 0.77    Estimated Creatinine Clearance: 85.9 mL/min (by C-G formula based on SCr of 0.77 mg/dL).   Medical History: Past Medical History:  Diagnosis Date   AAA (abdominal aortic aneurysm) ([place])    Followed by Dr. Sherren Mocha Early   Arthritis    CAD (coronary artery disease)    a. CABG 2000.   Cancer [facility])    Prostate:  Radiation Tx   Carotid artery disease ([place])    Chest pain    precordial. mild chronic .[name]... nonischemic   CHF (congestive heart failure) (HCC)    Chronic edema    Coronary artery disease    a. Nuclear, January, 2008, no ischemia b. Cath 08/2012- 1/4 patent grafts, RCA CTO, no flow-limiting disease, medically managed   Diabetes mellitus without complication ([place])    Dizziness 02/2011   Dyslipidemia    Fall    GERD (gastroesophageal reflux disease)    TAKES TUMS & ROLAIDS AS NEEDED   Hx of CABG 2000   Hypertension    Myocardial infarction ([place]) 1999   Neck pain 02/2011   Pneumonia    Pulmonary hypertension ([place])    Renal artery stenosis (HCC)    50-70%   S/P femoropopliteal bypass surgery    Dr. Donnetta Hutching   Sinus bradycardia    Asymptomatic    Medications:  See meds  Assessment: 81 year old male with a history of coronary artery disease, permanent atrial fibrillation  on warfarin, HFpEF, severe pulmonary hypertension, diabetes mellitus type 2, renal artery stenosis, hyperlipidemia, hypertension, esophageal dysphagia, peripheral vascular disease, chronic respiratory failure on nighttime oxygen presenting with 1 day history of left-sided testicular pain and edema. Pharmacy asked to manage warfarin.  Home dose is '2mg'$  daily per last office visit 11/24/21   INR 3.1, supratherapeutic  Goal of Therapy:  INR 2-3 Monitor platelets by anticoagulation protocol: Yes   Plan:  No warfarin today Daily PT-INR Monitor CBC and for bleeding  Isac Sarna, BS Pharm D, BCPS Clinical Pharmacist 11/28/2021,8:15 AM

## 2021-11-28 NOTE — TOC Progression Note (Signed)
  Transition of Care [facility]) Screening Note   Patient Details  Name: Troy [name] Date of Birth: 06-16-1940   Transition of Care [facility]) CM/SW Contact:    Boneta Lucks, RN Phone Number: 11/28/2021, 11:22 AM  [facility] notification done - [phone] [facility] with  [facility]  RN/PT/OT  Transition of Care Department [facility]) has reviewed patient and no TOC needs have been identified at this time. We will continue to monitor patient advancement through interdisciplinary progression rounds. If new patient transition needs arise, please place a TOC consult.      Barriers to Discharge: Continued Medical Work up

## 2021-11-28 NOTE — Hospital Course (Signed)
81 year old male with a history of coronary artery disease, permanent atrial fibrillation on warfarin, HFpEF, severe pulmonary hypertension, diabetes mellitus type 2, renal artery stenosis, hyperlipidemia, hypertension, esophageal dysphagia, peripheral vascular disease, chronic respiratory failure on nighttime oxygen presenting with 1 day history of left-sided testicular pain and edema.  The patient was recently discharged from the hospital after a stay from 11/06/2021 to 11/09/2021 for acute on chronic diastolic CHF.  He was discharged home on his previous dose of torsemide 10 mg every 48 hours.  His discharge weight was 198.2 pounds.  The patient followed up in the urology clinic 11/13/2021.  For his chronic urinary frequency.  The patient was started on cephalexin which she states he has finished.  He is not exactly sure which that he finished.  Nevertheless, the patient had been doing fairly well until he noticed increasing pain and edema on the evening of 11/27/2021 in his left testicle.  He states that it progressed to the point where he was having difficulty standing up and walking.  He lives by himself.  He denies any recent injury or trauma.  He has not had fevers, chills, nausea, vomiting or diarrhea abdominal pain.  He states that lower extremity edema is about the same as usual.  He denies any chest pain or shortness of breath, coughing, hemoptysis. In the ED, the patient was afebrile and hemodynamically stable with oxygen saturation 97% on room air.  WBC 12.0, hemoglobin 11.5, platelets 194,000.  Sodium 138, potassium 3.6, bicarbonate 24, BUN 8, serum creatinine 0.77.  Ultrasound of the scrotum was negative for torsion but showed asymmetric enlargement of the left testicle and left epididymitis.  The patient was given a dose of levofloxacin.

## 2021-11-28 NOTE — ED Provider Notes (Signed)
[facility] EMERGENCY DEPARTMENT Provider Note   CSN: [id number] Arrival date & time: 11/28/21  0344     History  Chief Complaint  Patient presents with   Testicle Pain    Troy [name] is a 81 y.o. male.  The history is provided by the patient.  Testicle Pain This is a new problem. The problem occurs constantly. The problem has been rapidly worsening. The symptoms are aggravated by walking. Nothing relieves the symptoms.   Patient with extensive history presents with left testicle pain.  Patient reports over the past several hours is increasing left testicle pain and swelling.  He also has difficulty urinating.  Denies any recent falls or trauma.  He reports it hurts to walk and move his left leg. No fevers or vomiting   Past Medical History:  Diagnosis Date   AAA (abdominal aortic aneurysm) ([place])    Followed by Dr. Sherren Mocha Early   Arthritis    CAD (coronary artery disease)    a. CABG 2000.   Cancer [facility])    Prostate:  Radiation Tx   Carotid artery disease ([place])    Chest pain    precordial. mild chronic .[name]... nonischemic   CHF (congestive heart failure) (HCC)    Chronic edema    Coronary artery disease    a. Nuclear, January, 2008, no ischemia b. Cath 08/2012- 1/4 patent grafts, RCA CTO, no flow-limiting disease, medically managed   Diabetes mellitus without complication ([place])    Dizziness 02/2011   Dyslipidemia    Fall    GERD (gastroesophageal reflux disease)    TAKES TUMS & ROLAIDS AS NEEDED   Hx of CABG 2000   Hypertension    Myocardial infarction ([place]) 1999   Neck pain 02/2011   Pneumonia    Pulmonary hypertension ([place])    Renal artery stenosis (HCC)    50-70%   S/P femoropopliteal bypass surgery    Dr. Donnetta Hutching   Sinus bradycardia    Asymptomatic    Home Medications Prior to Admission medications   Medication Sig Start Date End Date Taking? Authorizing Provider  acetaminophen (TYLENOL) 325 MG tablet Take 2 tablets (650 mg total) by mouth every 6 (six)  hours as needed for mild pain, fever or headache (or Fever >/= 101). 03/29/20   Roxan Hockey, MD  albuterol (VENTOLIN HFA) 108 (90 Base) MCG/ACT inhaler Inhale 2 puffs into the lungs every 6 (six) hours as needed for wheezing or shortness of breath. 03/29/20   Roxan Hockey, MD  amLODipine (NORVASC) 10 MG tablet Take 10 mg by mouth daily.    [provider]  aspirin 81 MG EC tablet Take 1 tablet (81 mg total) by mouth daily with breakfast. 03/29/20   Denton Brick, Courage, MD  atorvastatin (LIPITOR) 40 MG tablet Take 1 tablet (40 mg total) by mouth daily. 11/30/13   Carlena Bjornstad, MD  famotidine (PEPCID) 20 MG tablet One after supper 06/07/20   Tanda Rockers, MD  HYDROcodone-acetaminophen (NORCO/VICODIN) 5-325 MG tablet Take 1 tablet by mouth daily as needed for moderate pain. 08/31/21   McKenzie, Candee Furbish, MD  ipratropium (ATROVENT) 0.03 % nasal spray Place 2 sprays into both nostrils as needed for rhinitis.    [provider]  ipratropium-albuterol (DUONEB) 0.5-2.5 (3) MG/3ML SOLN Take 3 mLs by nebulization every 4 (four) hours as needed. 03/29/20   Roxan Hockey, MD  isosorbide mononitrate (IMDUR) 60 MG 24 hr tablet Take 2 tablets (120 mg total) by mouth every evening. 04/11/20  Larey Dresser, MD  losartan (COZAAR) 25 MG tablet Take 1 tablet (25 mg total) by mouth daily. 03/29/20   Roxan Hockey, MD  metFORMIN (GLUCOPHAGE) 1000 MG tablet Take 500 mg by mouth 2 (two) times daily with a meal.    [provider]  metoprolol succinate (TOPROL-XL) 25 MG 24 hr tablet Take 1 tablet (25 mg total) by mouth daily. Pt. Needs to make an appt. With Cardiologist in order to receive further refills. Thank You. 1st Attempt. 05/15/21   Dunn, Nedra Hai, PA-C  nitroGLYCERIN (NITROSTAT) 0.4 MG SL tablet Place 1 tablet (0.4 mg total) under the tongue every 5 (five) minutes x 3 doses as needed for chest pain. 12/14/19   Dunn, Nedra Hai, PA-C  OXYGEN Inhale 3 L into the lungs at bedtime.     [provider]  pantoprazole (PROTONIX) 40 MG tablet Take 1 tablet (40 mg total) by mouth daily. Take 30-60 min before first meal of the day 06/07/20   Tanda Rockers, MD  potassium chloride (KLOR-CON) 10 MEQ tablet Take 1 tablet (10 mEq total) by mouth daily. Only take while taking Torsemide 04/04/20   Larey Dresser, MD  tamsulosin (FLOMAX) 0.4 MG CAPS capsule Take 1 capsule (0.4 mg total) by mouth daily after supper. 10/27/21   McKenzie, Candee Furbish, MD  torsemide (DEMADEX) 10 MG tablet Take 1 tablet (10 mg total) by mouth every other day. Take 20 mg for weight gain over 3 lbs in 24 hours 04/13/20   Larey Dresser, MD  Vibegron (GEMTESA) 75 MG TABS Take 75 mg by mouth daily. 11/13/21   Summerlin, Berneice Heinrich, PA-C  warfarin (COUMADIN) 1 MG tablet Take 1 mg by mouth every evening. Take 2 mg  every other day and 1 mg all others 09/27/12   Arguello, Roger A, PA-C      Allergies    Niacin, Contrast media [iodinated contrast media], Iodine-131, and Nsaids    Review of Systems   Review of Systems  Constitutional:  Negative for fever.  Gastrointestinal:  Negative for vomiting.  Genitourinary:  Positive for testicular pain.    Physical Exam Updated Vital Signs BP (!) 146/76 (BP Location: Left Arm)   Pulse (!) 53   Temp 98.3 F (36.8 C) (Oral)   Resp 17   Ht 1.778 m ('5\' 10"'$ )   Wt 89.9 kg   SpO2 97%   BMI 28.44 kg/m  Physical Exam CONSTITUTIONAL: Elderly and ill-appearing HEAD: Normocephalic/atraumatic EYES: EOMI ENMT: Mucous membranes moist NECK: supple no meningeal signs SPINE/BACK:entire spine nontender CV: S1/S2 noted LUNGS: Lungs are clear to auscultation bilaterally, no apparent distress ABDOMEN: soft, nontender, no rebound or guarding, bowel sounds noted throughout abdomen, obese GU:no cva tenderness Left testicle is tender and is edematous.  There is no overlying erythema or crepitus No obvious hernia.  The right testicle is nontender. Nurse chaperone present  for exam NEURO: Pt is awake/alert/appropriate, moves all extremitiesx4.  No facial droop.   EXTREMITIES: pulses normal/equal, full ROM Distal pulses equal and intact in both lower extremities.  Limitation in range of motion of left leg due to pain SKIN: warm, color normal PSYCH: Anxious  ED Results / Procedures / Treatments   Labs (all labs ordered are listed, but only abnormal results are displayed) Labs Reviewed  CBC WITH DIFFERENTIAL/PLATELET - Abnormal; Notable for the following components:      Result Value   WBC 12.0 (*)    RBC 4.02 (*)    Hemoglobin 11.5 (*)  HCT 36.3 (*)    RDW 16.4 (*)    Neutro Abs 9.8 (*)    All other components within normal limits  BASIC METABOLIC PANEL - Abnormal; Notable for the following components:   Glucose, Bld 126 (*)    Calcium 8.7 (*)    All other components within normal limits  URINALYSIS, ROUTINE W REFLEX MICROSCOPIC - Abnormal; Notable for the following components:   APPearance HAZY (*)    Hgb urine dipstick SMALL (*)    Protein, ur 100 (*)    Nitrite POSITIVE (*)    Leukocytes,Ua LARGE (*)    WBC, UA >50 (*)    Bacteria, UA RARE (*)    All other components within normal limits  PROTIME-INR - Abnormal; Notable for the following components:   Prothrombin Time 31.3 (*)    INR 3.1 (*)    All other components within normal limits  URINE CULTURE    EKG None  Radiology US SCROTUM W/DOPPLER  Result Date: 11/28/2021 CLINICAL DATA:  Left testicle pain and swelling since yesterday. Difficulty urinating. History of prostate cancer, status post TURP EXAM: SCROTAL ULTRASOUND DOPPLER ULTRASOUND OF THE TESTICLES TECHNIQUE: Complete ultrasound examination of the testicles, epididymis, and other scrotal structures was performed. Color and spectral Doppler ultrasound were also utilized to evaluate blood flow to the testicles. COMPARISON:  None Available. FINDINGS: Right testicle Measurements: 3.9 x 2.5 x 2.5 cm (volume = 13 cm^3). No mass or  microlithiasis visualized. Left testicle Measurements: 4.3 x 2.4 x 3.4 cm (volume = 18 cm^3). Surrounding soft tissues appear edematous. Focal area of Ectasias of the rete testes noted. Anechoic, well-circumscribed cyst with increased through transmission is identified measuring 0.5 x 0.4 x 0.6 cm. No mass or microlithiasis visualized. Right epididymis:  Normal in size and appearance. Left epididymis: Appears enlarged with increased blood flow. Cyst measures 7 x 5 x 9 mm. Hydrocele:  Small bilateral hydroceles. Varicocele:  None visualized. Pulsed Doppler interrogation of both testes demonstrates normal low resistance arterial and venous waveforms bilaterally. Asymmetric increased blood flow to the left testis noted. IMPRESSION: 1. No evidence for testicular mass or torsion. 2. Asymmetric enlargement of the left testicle and left epididymis with increased blood flow concerning for left-sided orchitis and epididymitis. 3. Small bilateral hydroceles. Electronically Signed   By: Kerby Moors M.D.   On: 11/28/2021 06:34    Procedures Procedures    Medications Ordered in ED Medications  levofloxacin (LEVAQUIN) IVPB 500 mg (500 mg Intravenous New Bag/Given 11/28/21 0716)  fentaNYL (SUBLIMAZE) injection 50 mcg (50 mcg Intravenous Given 11/28/21 0410)  fentaNYL (SUBLIMAZE) injection 25 mcg (25 mcg Intravenous Given 11/28/21 5366)    ED Course/ Medical Decision Making/ A&P Clinical Course as of 11/28/21 0724  Tue Nov 28, 2021  0406 Plan to obtain stat ultrasound of the testicle to rule out torsion [DW]  0454 WBC(!): 12.0 Leukocytosis [DW]  0454 Glucose(!): 126 Mild hyperglycemia [DW]  0454 Patient now resting comfortably, but reports that he movement his left testicle hurts.  He reports he recently completed a course of antibiotics for urine infection.  He is awaiting scrotal ultrasound [DW]  [phone] Discussed the case with pharmacy about appropriate antibiotics for this patient given age, comorbidities and  Coumadin use.  If he is admitted we can start with Levaquin.  If he is discharged he can be placed on Augmentin [DW]  [phone] Patient reports continued pain and difficulty moving.  He reports he has difficulty walking and is almost fallen at  home.  Due to his significant comorbidities, high risk for falls, and challenges with transportation, patient would benefit from admission.  This way his INR can be monitored while he gets the drug of choice which is Levaquin. [DW]    Clinical Course User Index [DW] Ripley Fraise, MD                           Medical Decision Making Amount and/or Complexity of Data Reviewed Labs: ordered. Decision-making details documented in ED Course. Radiology: ordered.  Risk Prescription drug management.   This patient presents to the ED for concern of testicle pain, this involves an extensive number of treatment options, and is a complaint that carries with it a high risk of complications and morbidity.  The differential diagnosis includes but is not limited to torsion, orchitis, epididymitis, UTI, hernia  Comorbidities that complicate the patient evaluation: Patient's presentation is complicated by their history of AAA, prostate cancer  Social Determinants of Health: Patient's  limited mobility   increases the complexity of managing their presentation  Additional history obtained: Additional history obtained from EMS discussed with paramedics at bedside Records reviewed  urology notes reviewed  Lab Tests: I Ordered, and personally interpreted labs.  The pertinent results include: UTI, leukocytosis  Imaging Studies ordered: I ordered imaging studies including scrotal ultrasound I independently visualized and interpreted imaging which showed orchitis I agree with the radiologist interpretation    Medicines ordered and prescription drug management: I ordered medication including fentanyl for pain Levaquin for orchitis Reevaluation of the patient after  these medicines showed that the patient    improved  Critical Interventions:  IV antibiotics  Consultations Obtained: I requested consultation with the admitting physician [facility] Dr. Carles Collet , and discussed  findings as well as pertinent plan - they recommend: [place] for antibiotic recommendations  Reevaluation: After the interventions noted above, I reevaluated the patient and found that they have :improved  Complexity of problems addressed: Patient's presentation is most consistent with  acute presentation with potential threat to life or bodily function  Disposition: After consideration of the diagnostic results and the patient's response to treatment,  I feel that the patent would benefit from admission   .           Final Clinical Impression(s) / ED Diagnoses Final diagnoses:  Orchitis    Rx / DC Orders ED Discharge Orders     None         Ripley Fraise, MD 11/28/21 [phone]

## 2021-11-29 ENCOUNTER — Other Ambulatory Visit: Payer: Self-pay | Admitting: *Deleted

## 2021-11-29 DIAGNOSIS — E1151 Type 2 diabetes mellitus with diabetic peripheral angiopathy without gangrene: Secondary | ICD-10-CM | POA: Diagnosis present

## 2021-11-29 DIAGNOSIS — Z8546 Personal history of malignant neoplasm of prostate: Secondary | ICD-10-CM | POA: Diagnosis not present

## 2021-11-29 DIAGNOSIS — I739 Peripheral vascular disease, unspecified: Secondary | ICD-10-CM | POA: Diagnosis not present

## 2021-11-29 DIAGNOSIS — I272 Pulmonary hypertension, unspecified: Secondary | ICD-10-CM | POA: Diagnosis not present

## 2021-11-29 DIAGNOSIS — I2510 Atherosclerotic heart disease of native coronary artery without angina pectoris: Secondary | ICD-10-CM | POA: Diagnosis present

## 2021-11-29 DIAGNOSIS — E785 Hyperlipidemia, unspecified: Secondary | ICD-10-CM | POA: Diagnosis not present

## 2021-11-29 DIAGNOSIS — B9620 Unspecified Escherichia coli [E. coli] as the cause of diseases classified elsewhere: Secondary | ICD-10-CM | POA: Diagnosis present

## 2021-11-29 DIAGNOSIS — I4821 Permanent atrial fibrillation: Secondary | ICD-10-CM | POA: Diagnosis present

## 2021-11-29 DIAGNOSIS — I25810 Atherosclerosis of coronary artery bypass graft(s) without angina pectoris: Secondary | ICD-10-CM | POA: Diagnosis present

## 2021-11-29 DIAGNOSIS — N453 Epididymo-orchitis: Secondary | ICD-10-CM | POA: Diagnosis not present

## 2021-11-29 DIAGNOSIS — I5032 Chronic diastolic (congestive) heart failure: Secondary | ICD-10-CM | POA: Diagnosis not present

## 2021-11-29 DIAGNOSIS — R531 Weakness: Secondary | ICD-10-CM | POA: Diagnosis present

## 2021-11-29 DIAGNOSIS — I110 Hypertensive heart disease with heart failure: Secondary | ICD-10-CM | POA: Diagnosis present

## 2021-11-29 DIAGNOSIS — G4734 Idiopathic sleep related nonobstructive alveolar hypoventilation: Secondary | ICD-10-CM | POA: Diagnosis not present

## 2021-11-29 DIAGNOSIS — I779 Disorder of arteries and arterioles, unspecified: Secondary | ICD-10-CM

## 2021-11-29 DIAGNOSIS — Z833 Family history of diabetes mellitus: Secondary | ICD-10-CM | POA: Diagnosis not present

## 2021-11-29 DIAGNOSIS — Z87891 Personal history of nicotine dependence: Secondary | ICD-10-CM | POA: Diagnosis not present

## 2021-11-29 DIAGNOSIS — D6859 Other primary thrombophilia: Secondary | ICD-10-CM | POA: Diagnosis present

## 2021-11-29 DIAGNOSIS — R1314 Dysphagia, pharyngoesophageal phase: Secondary | ICD-10-CM | POA: Diagnosis present

## 2021-11-29 DIAGNOSIS — I252 Old myocardial infarction: Secondary | ICD-10-CM | POA: Diagnosis not present

## 2021-11-29 DIAGNOSIS — K219 Gastro-esophageal reflux disease without esophagitis: Secondary | ICD-10-CM | POA: Diagnosis not present

## 2021-11-29 DIAGNOSIS — I4819 Other persistent atrial fibrillation: Secondary | ICD-10-CM | POA: Diagnosis not present

## 2021-11-29 DIAGNOSIS — J9611 Chronic respiratory failure with hypoxia: Secondary | ICD-10-CM | POA: Diagnosis present

## 2021-11-29 DIAGNOSIS — N452 Orchitis: Secondary | ICD-10-CM

## 2021-11-29 DIAGNOSIS — Z7901 Long term (current) use of anticoagulants: Secondary | ICD-10-CM | POA: Diagnosis not present

## 2021-11-29 DIAGNOSIS — I701 Atherosclerosis of renal artery: Secondary | ICD-10-CM | POA: Diagnosis present

## 2021-11-29 DIAGNOSIS — Z8249 Family history of ischemic heart disease and other diseases of the circulatory system: Secondary | ICD-10-CM | POA: Diagnosis not present

## 2021-11-29 DIAGNOSIS — K5900 Constipation, unspecified: Secondary | ICD-10-CM | POA: Diagnosis present

## 2021-11-29 DIAGNOSIS — I714 Abdominal aortic aneurysm, without rupture, unspecified: Secondary | ICD-10-CM | POA: Diagnosis present

## 2021-11-29 DIAGNOSIS — I4891 Unspecified atrial fibrillation: Secondary | ICD-10-CM | POA: Diagnosis not present

## 2021-11-29 LAB — CBC
HCT: 35.6 % — ABNORMAL LOW (ref 39.0–52.0)
HCT: 31.7 % — ABNORMAL LOW (ref 39.0–52.0)
Hemoglobin: 11 g/dL — ABNORMAL LOW (ref 13.0–17.0)
Hemoglobin: 10 g/dL — ABNORMAL LOW (ref 13.0–17.0)
MCH: 28.1 pg (ref 26.0–34.0)
MCH: 28.5 pg (ref 26.0–34.0)
MCHC: 30.9 g/dL (ref 30.0–36.0)
MCHC: 31.5 g/dL (ref 30.0–36.0)
MCV: 90.8 fL (ref 80.0–100.0)
MCV: 90.3 fL (ref 80.0–100.0)
Platelets: 166 10*3/uL (ref 150–400)
Platelets: 157 10*3/uL (ref 150–400)
RBC: 3.92 MIL/uL — ABNORMAL LOW (ref 4.22–5.81)
RBC: 3.51 MIL/uL — ABNORMAL LOW (ref 4.22–5.81)
RDW: 16.4 % — ABNORMAL HIGH (ref 11.5–15.5)
RDW: 16.4 % — ABNORMAL HIGH (ref 11.5–15.5)
WBC: 13.6 10*3/uL — ABNORMAL HIGH (ref 4.0–10.5)
WBC: 11.7 10*3/uL — ABNORMAL HIGH (ref 4.0–10.5)
nRBC: 0 % (ref 0.0–0.2)
nRBC: 0 % (ref 0.0–0.2)

## 2021-11-29 LAB — PROTIME-INR
INR: 3 — ABNORMAL HIGH (ref 0.8–1.2)
Prothrombin Time: 30.9 seconds — ABNORMAL HIGH (ref 11.4–15.2)

## 2021-11-29 LAB — BASIC METABOLIC PANEL
Anion gap: 9 (ref 5–15)
BUN: 14 mg/dL (ref 8–23)
CO2: 23 mmol/L (ref 22–32)
Calcium: 8.6 mg/dL — ABNORMAL LOW (ref 8.9–10.3)
Chloride: 104 mmol/L (ref 98–111)
Creatinine, Ser: 0.9 mg/dL (ref 0.61–1.24)
GFR, Estimated: >60 mL/min (ref >60)
Glucose, Bld: 123 mg/dL — ABNORMAL HIGH (ref 70–99)
Potassium: 3.8 mmol/L (ref 3.5–5.1)
Sodium: 136 mmol/L (ref 135–145)

## 2021-11-29 LAB — GLUCOSE, CAPILLARY
Glucose-Capillary: 134 mg/dL — ABNORMAL HIGH (ref 70–99)
Glucose-Capillary: 156 mg/dL — ABNORMAL HIGH (ref 70–99)
Glucose-Capillary: 131 mg/dL — ABNORMAL HIGH (ref 70–99)
Glucose-Capillary: 149 mg/dL — ABNORMAL HIGH (ref 70–99)

## 2021-11-29 MED ORDER — WARFARIN - PHARMACIST DOSING INPATIENT: Freq: Every day | Status: DC

## 2021-11-29 MED ORDER — WARFARIN SODIUM 1 MG PO TABS
1.0000 mg | ORAL_TABLET | Freq: Once | ORAL | Status: AC
  Administered 2021-11-29: 1 mg via ORAL
  Filled 2021-11-29: qty 1

## 2021-11-29 MED ORDER — BISACODYL 10 MG RE SUPP
10.0000 mg | Freq: Once | RECTAL | Status: AC
  Administered 2021-11-29: 10 mg via RECTAL
  Filled 2021-11-29: qty 1

## 2021-11-29 NOTE — Progress Notes (Signed)
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  The patient was recently discharged from the hospital after a stay from 11/06/2021 to 11/09/2021 for acute on chronic diastolic CHF.  He was discharged home on his previous dose of torsemide 10 mg every 48 hours.  His discharge weight was 198.2 pounds.  The patient followed up in the urology clinic 11/13/2021.  For his chronic urinary frequency.  The patient was started on cephalexin which she states he has finished.  He is not exactly sure which that he finished.  Nevertheless, the patient had been doing fairly well until he noticed increasing pain and edema on the evening of 11/27/2021 in his left testicle.  He states that it progressed to the point where he was having difficulty standing up and walking.  He lives by himself.  He denies any recent injury or trauma.  He has not had fevers, chills, nausea, vomiting or diarrhea abdominal pain.  He states that lower extremity edema is about the same as usual.  He denies any chest pain or shortness of breath, coughing, hemoptysis. In the ED, the patient was afebrile and hemodynamically stable with oxygen saturation 97% on room air.  WBC 12.0, hemoglobin 11.5, platelets 194,000.  Sodium 138, potassium 3.6, bicarbonate 24, BUN 8, serum creatinine 0.77.  Ultrasound of the scrotum was negative for torsion but showed asymmetric enlargement of the left testicle and left epididymitis.  The patient was given a dose of levofloxacin   Assessment &  Plan:   Principal Problem:   Epididymoorchitis Active Problems:   --Severe pulmonary HTN, 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 ----Pulmonary HTN    Hyperlipidemia   Renal artery stenosis (HCC)   Peripheral vascular disease, unspecified (HCC)   Atrial fibrillation (HCC)   Acquired thrombophilia (HCC)   Chronic heart failure with preserved ejection fraction (HFpEF) (HCC)   Acute epididymoorchitis -The patient has had numerous recent antibiotic exposure predisposing him to MDR organism -Start Zosyn -Trying to avoid quinolones at this time given drug interaction with warfarin -Follow urine culture -UA> 50 WBC -Pending urine culture May consider discharge home with Augmentin   chronic HFpEF Appears clinically euvolemic Discharge weight 198.2 lbs on 11/09/21 Daily weight`` restartPTA torsemide dose 10 mg qod   Severe Pulmonary Hypertension  - Pt is being followed by advanced HF clinic -Moderate PH on RHC in 1/22 - 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  - Dr. Aundra Dubin outpatient follow up after discharge    Permanent Atrial Fibrillation - HR well controlled, pt is anticoagulated with warfarin -Pharmacy consult for warfarin management in hospital -Following daily PT/INR   Subtherapeutic INR -PharmD has been assisting with dosing during hospitalization -pt instructed to take warfarin 4 mg today -pt states he will get his INR checked on 11/10/21 at PCP office   Hyperlipidemia - resumed home statin   Type 2 diabetes mellitus, controlled with vascular complications  -SSI coverage and frequent CBG monitoring in addition to prandial NovoLog ordered -hold metformin - 08/29/21 A1C--6.5   Esophageal dysphagia  -Patient reports that he is scheduled to have a barium swallow evaluation at  [facility] on 8/8.  -SLP saw pt last admission -SLP recommends regular diet with thin liquids   CAD s/p CABG at age 71 - he is being medically managed now and we have resumed his cardiac medications - Cath in 1/22 showed  patent LIMA-LAD and SVG-D but occluded SVG-OM and SVG-RCA -continue imdur and metoprolol succinate and atorava   chronic respiratory failure with hypoxia -on 3L at night at home   Essential hypertension  - resuming home medication and follow closely    PVD - severe  - he has had multiple bypass surgeries - resume medical management  - Occluded right fem-pop bypass.  No claudication - continue ASA   DVT prophylaxis: warfarin  Code Status: full code Family Communication: no family present Disposition Plan: Status is: Inpatient Remains inpatient appropriate because: continued IV antibiotics     Consultants:    Procedures:    Antimicrobials:  Zosyn 8/29>    Subjective: Continues to have significant pain in left testicle. Having difficulty having a bowel movement  Objective: Vitals:   11/28/21 2028 11/29/21 0427 11/29/21 1347 11/29/21 2152  BP: (!) 132/54 (!) 119/53 (!) 108/57 98/84  Pulse: (!) 51 (!) 54 (!) 48 (!) 58  Resp: '14 16 18 18  '$ Temp: 99.7 F (37.6 C) 98 F (36.7 C) 97.8 F (36.6 C) 99.9 F (37.7 C)  TempSrc:   Oral   SpO2: 96% 98% 96% 93%  Weight:      Height:        Intake/Output Summary (Last 24 hours) at 11/29/2021 2249 Last data filed at 11/29/2021 1700 Gross per 24 hour  Intake 1250 ml  Output --  Net 1250 ml   Filed Weights   11/28/21 0348  Weight: 89.9 kg    Examination:  General exam: Appears calm and comfortable  Respiratory system: Clear to auscultation. Respiratory effort normal. Cardiovascular system: S1 & S2 heard, RRR. No JVD, murmurs, rubs, gallops or clicks. No pedal edema. Gastrointestinal system: Abdomen is nondistended, soft and nontender. No organomegaly or masses felt. Normal bowel sounds heard. GU: continues to have painful and erythematous left testicle Central nervous system: Alert and oriented. No focal neurological deficits. Extremities: Symmetric 5 x 5 power. Skin: No rashes, lesions or ulcers Psychiatry:  Judgement and insight appear normal. Mood & affect appropriate.     Data Reviewed: I have personally reviewed following labs and imaging studies  CBC: Recent Labs  Lab 11/28/21 0357 11/29/21 0337 11/29/21 0757  WBC 12.0* 13.6* 11.7*  NEUTROABS 9.8*  --   --   HGB 11.5* 11.0* 10.0*  HCT 36.3* 35.6* 31.7*  MCV 90.3 90.8 90.3  PLT 194 166 003   Basic Metabolic Panel: Recent Labs  Lab 11/28/21 0357 11/29/21 0337  NA 138 136  K 3.6 3.8  CL 104 104  CO2 24 23  GLUCOSE 126* 123*  BUN 8 14  CREATININE 0.77 0.90  CALCIUM 8.7* 8.6*   GFR: Estimated Creatinine Clearance: 76.4 mL/min (by C-G formula based on SCr of 0.9 mg/dL). Liver Function Tests: No results for input(s): "AST", "ALT", "ALKPHOS", "BILITOT", "PROT", "ALBUMIN" in the last 168 hours. No results for input(s): "LIPASE", "AMYLASE" in the last 168 hours. No results for input(s): "AMMONIA" in the last 168 hours. Coagulation Profile: Recent Labs  Lab 11/28/21 0357 11/29/21 0337  INR 3.1* 3.0*   Cardiac Enzymes: No results for input(s): "CKTOTAL", "CKMB", "CKMBINDEX", "TROPONINI" in the last 168 hours. BNP (last 3 results) No results for input(s): "PROBNP" in  the last 8760 hours. HbA1C: No results for input(s): "HGBA1C" in the last 72 hours. CBG: Recent Labs  Lab 11/28/21 1630 11/28/21 2119 11/29/21 0726 11/29/21 1137 11/29/21 1621  GLUCAP 124* 153* 134* 156* 131*   Lipid Profile: No results for input(s): "CHOL", "HDL", "LDLCALC", "TRIG", "CHOLHDL", "LDLDIRECT" in the last 72 hours. Thyroid Function Tests: No results for input(s): "TSH", "T4TOTAL", "FREET4", "T3FREE", "THYROIDAB" in the last 72 hours. Anemia Panel: No results for input(s): "VITAMINB12", "FOLATE", "FERRITIN", "TIBC", "IRON", "RETICCTPCT" in the last 72 hours. Sepsis Labs: No results for input(s): "PROCALCITON", "LATICACIDVEN" in the last 168 hours.  Recent Results (from the past 240 hour(s))  Urine Culture     Status: Abnormal  (Preliminary result)   Collection Time: 11/28/21  3:58 AM   Specimen: Urine, Clean Catch  Result Value Ref Range Status   Specimen Description   Final    URINE, CLEAN CATCH Performed at [facility], [address] [place], [place] [address]    Special Requests   Final    NONE Performed at [facility], [address], [place], [place] [address]    Culture (A)  Final    >=100,000 COLONIES/mL ESCHERICHIA COLI SUSCEPTIBILITIES TO FOLLOW Performed at [facility] [address], [place], [place] [address]    Report Status PENDING  Incomplete         Radiology Studies: US SCROTUM W/DOPPLER  Result Date: 11/28/2021 CLINICAL DATA:  Left testicle pain and swelling since yesterday. Difficulty urinating. History of prostate cancer, status post TURP EXAM: SCROTAL ULTRASOUND DOPPLER ULTRASOUND OF THE TESTICLES TECHNIQUE: Complete ultrasound examination of the testicles, epididymis, and other scrotal structures was performed. Color and spectral Doppler ultrasound were also utilized to evaluate blood flow to the testicles. COMPARISON:  None Available. FINDINGS: Right testicle Measurements: 3.9 x 2.5 x 2.5 cm (volume = 13 cm^3). No mass or microlithiasis visualized. Left testicle Measurements: 4.3 x 2.4 x 3.4 cm (volume = 18 cm^3). Surrounding soft tissues appear edematous. Focal area of Ectasias of the rete testes noted. Anechoic, well-circumscribed cyst with increased through transmission is identified measuring 0.5 x 0.4 x 0.6 cm. No mass or microlithiasis visualized. Right epididymis:  Normal in size and appearance. Left epididymis: Appears enlarged with increased blood flow. Cyst measures 7 x 5 x 9 mm. Hydrocele:  Small bilateral hydroceles. Varicocele:  None visualized. Pulsed Doppler interrogation of both testes demonstrates normal low resistance arterial and venous waveforms bilaterally. Asymmetric increased blood flow to the left testis noted. IMPRESSION: 1. No evidence for testicular  mass or torsion. 2. Asymmetric enlargement of the left testicle and left epididymis with increased blood flow concerning for left-sided orchitis and epididymitis. 3. Small bilateral hydroceles. Electronically Signed   By: Kerby Moors M.D.   On: 11/28/2021 06:34        Scheduled Meds:  amLODipine  10 mg Oral Daily   aspirin EC  81 mg Oral Q breakfast   atorvastatin  40 mg Oral Daily   famotidine  20 mg Oral Daily   isosorbide mononitrate  120 mg Oral QPM   losartan  25 mg Oral Daily   metoprolol succinate  25 mg Oral Daily   mirabegron ER  25 mg Oral Daily   pantoprazole  40 mg Oral QAC breakfast   tamsulosin  0.4 mg Oral QPC supper   torsemide  10 mg Oral QODAY   Warfarin - Pharmacist Dosing Inpatient   Does not apply q1600   Continuous Infusions:  piperacillin-tazobactam (ZOSYN)  IV 3.375 g (11/29/21 1805)     LOS: 0 days    Time spent: 25 mins    Kathie Dike, MD [facility]   If 7PM-7AM, please contact night-coverage [email or web address]  11/29/2021, 10:49 PM

## 2021-11-29 NOTE — Progress Notes (Signed)
[place] for Warfarin Indication: atrial fibrillation  Allergies  Allergen Reactions   Niacin Itching and Rash    Burning sensation   Contrast Media [Iodinated Contrast Media] Nausea And Vomiting   Iodine-131 Nausea And Vomiting   Nsaids Other (See Comments)    Taking Coumadin    Patient Measurements: Height: '5\' 10"'$  (177.8 cm) Weight: 89.9 kg (198 lb 3.1 oz) IBW/kg (Calculated) : 73  Vital Signs: Temp: 98 F (36.7 C) (08/30 0427) BP: 119/53 (08/30 0427) Pulse Rate: 54 (08/30 0427)  Labs: Recent Labs    11/28/21 0357 11/29/21 0337  HGB 11.5* 11.0*  HCT 36.3* 35.6*  PLT 194 166  LABPROT 31.3* 30.9*  INR 3.1* 3.0*  CREATININE 0.77 0.90     Estimated Creatinine Clearance: 76.4 mL/min (by C-G formula based on SCr of 0.9 mg/dL).   Medical History: Past Medical History:  Diagnosis Date   AAA (abdominal aortic aneurysm) ([place])    Followed by Dr. Sherren Mocha Early   Arthritis    CAD (coronary artery disease)    a. CABG 2000.   Cancer [facility])    Prostate:  Radiation Tx   Carotid artery disease ([place])    Chest pain    precordial. mild chronic .[name]... nonischemic   CHF (congestive heart failure) (HCC)    Chronic edema    Coronary artery disease    a. Nuclear, January, 2008, no ischemia b. Cath 08/2012- 1/4 patent grafts, RCA CTO, no flow-limiting disease, medically managed   Diabetes mellitus without complication ([place])    Dizziness 02/2011   Dyslipidemia    Fall    GERD (gastroesophageal reflux disease)    TAKES TUMS & ROLAIDS AS NEEDED   Hx of CABG 2000   Hypertension    Myocardial infarction ([place]) 1999   Neck pain 02/2011   Pneumonia    Pulmonary hypertension ([place])    Renal artery stenosis (HCC)    50-70%   S/P femoropopliteal bypass surgery    Dr. Donnetta Hutching   Sinus bradycardia    Asymptomatic    Medications:  See meds  Assessment: 81 year old male with a history of coronary artery disease, permanent atrial fibrillation on  warfarin, HFpEF, severe pulmonary hypertension, diabetes mellitus type 2, renal artery stenosis, hyperlipidemia, hypertension, esophageal dysphagia, peripheral vascular disease, chronic respiratory failure on nighttime oxygen presenting with 1 day history of left-sided testicular pain and edema. Pharmacy asked to manage warfarin.  Home dose is '2mg'$  daily per last office visit 11/24/21   INR 3.1 > 3.0, therapeutic  Goal of Therapy:  INR 2-3 Monitor platelets by anticoagulation protocol: Yes   Plan:  Warfarin 1 mg x 1 dose Daily PT-INR Monitor CBC and for bleeding  Margot Ables, PharmD Clinical Pharmacist 11/29/2021 8:00 AM

## 2021-11-30 ENCOUNTER — Ambulatory Visit (HOSPITAL_COMMUNITY): Payer: Medicare HMO

## 2021-11-30 DIAGNOSIS — N452 Orchitis: Secondary | ICD-10-CM | POA: Diagnosis not present

## 2021-11-30 DIAGNOSIS — I4819 Other persistent atrial fibrillation: Secondary | ICD-10-CM | POA: Diagnosis not present

## 2021-11-30 DIAGNOSIS — N453 Epididymo-orchitis: Secondary | ICD-10-CM | POA: Diagnosis not present

## 2021-11-30 DIAGNOSIS — I5032 Chronic diastolic (congestive) heart failure: Secondary | ICD-10-CM | POA: Diagnosis not present

## 2021-11-30 LAB — BASIC METABOLIC PANEL
Anion gap: 6 (ref 5–15)
BUN: 18 mg/dL (ref 8–23)
CO2: 26 mmol/L (ref 22–32)
Calcium: 8.1 mg/dL — ABNORMAL LOW (ref 8.9–10.3)
Chloride: 104 mmol/L (ref 98–111)
Creatinine, Ser: 1.07 mg/dL (ref 0.61–1.24)
GFR, Estimated: >60 mL/min (ref >60)
Glucose, Bld: 111 mg/dL — ABNORMAL HIGH (ref 70–99)
Potassium: 3.5 mmol/L (ref 3.5–5.1)
Sodium: 136 mmol/L (ref 135–145)

## 2021-11-30 LAB — HEMOGLOBIN AND HEMATOCRIT, BLOOD
HCT: 32.1 % — ABNORMAL LOW (ref 39.0–52.0)
Hemoglobin: 10.1 g/dL — ABNORMAL LOW (ref 13.0–17.0)

## 2021-11-30 LAB — URINE CULTURE: Culture: >=100000 — AB

## 2021-11-30 LAB — CBC
HCT: 31.3 % — ABNORMAL LOW (ref 39.0–52.0)
Hemoglobin: 9.8 g/dL — ABNORMAL LOW (ref 13.0–17.0)
MCH: 28.1 pg (ref 26.0–34.0)
MCHC: 31.3 g/dL (ref 30.0–36.0)
MCV: 89.7 fL (ref 80.0–100.0)
Platelets: 163 10*3/uL (ref 150–400)
RBC: 3.49 MIL/uL — ABNORMAL LOW (ref 4.22–5.81)
RDW: 16.4 % — ABNORMAL HIGH (ref 11.5–15.5)
WBC: 9.9 10*3/uL (ref 4.0–10.5)
nRBC: 0 % (ref 0.0–0.2)

## 2021-11-30 LAB — GLUCOSE, CAPILLARY
Glucose-Capillary: 106 mg/dL — ABNORMAL HIGH (ref 70–99)
Glucose-Capillary: 114 mg/dL — ABNORMAL HIGH (ref 70–99)

## 2021-11-30 LAB — PROTIME-INR
INR: 2.9 — ABNORMAL HIGH (ref 0.8–1.2)
Prothrombin Time: 30.1 seconds — ABNORMAL HIGH (ref 11.4–15.2)

## 2021-11-30 MED ORDER — SORBITOL 70 % SOLN
960.0000 mL | TOPICAL_OIL | Freq: Once | ORAL | Status: AC
  Administered 2021-11-30: 960 mL via RECTAL
  Filled 2021-11-30: qty 473

## 2021-11-30 MED ORDER — WARFARIN SODIUM 2 MG PO TABS
2.0000 mg | ORAL_TABLET | Freq: Once | ORAL | Status: AC
  Administered 2021-11-30: 2 mg via ORAL
  Filled 2021-11-30: qty 1

## 2021-11-30 MED ORDER — POTASSIUM CHLORIDE CRYS ER 20 MEQ PO TBCR
40.0000 meq | EXTENDED_RELEASE_TABLET | Freq: Once | ORAL | Status: AC
  Administered 2021-11-30: 40 meq via ORAL
  Filled 2021-11-30: qty 2

## 2021-11-30 NOTE — Progress Notes (Signed)
PROGRESS NOTE    Troy [name]  LOV:[id number] DOB: 22-Sep-1940 DOA: 11/28/2021 PCP: Sharilyn Sites, MD    Brief Narrative:  Troy [name] is a 81 year old male with a history of coronary artery disease, permanent atrial fibrillation on warfarin, HFpEF, severe pulmonary hypertension, diabetes mellitus type 2, renal artery stenosis, hyperlipidemia, hypertension, esophageal dysphagia, peripheral vascular disease, chronic respiratory failure on nighttime oxygen presenting with 1 day history of left-sided testicular pain and edema.  The patient was recently discharged from the hospital after a stay from 11/06/2021 to 11/09/2021 for acute on chronic diastolic CHF.  He was discharged home on his previous dose of torsemide 10 mg every 48 hours.  His discharge weight was 198.2 pounds.  The patient followed up in the urology clinic 11/13/2021.  For his chronic urinary frequency.  The patient was started on cephalexin which she states he has finished.  He is not exactly sure which that he finished.  Nevertheless, the patient had been doing fairly well until he noticed increasing pain and edema on the evening of 11/27/2021 in his left testicle.  He states that it progressed to the point where he was having difficulty standing up and walking.  He lives by himself.  He denies any recent injury or trauma.  He has not had fevers, chills, nausea, vomiting or diarrhea abdominal pain.  He states that lower extremity edema is about the same as usual.  He denies any chest pain or shortness of breath, coughing, hemoptysis. In the ED, the patient was afebrile and hemodynamically stable with oxygen saturation 97% on room air.  WBC 12.0, hemoglobin 11.5, platelets 194,000.  Sodium 138, potassium 3.6, bicarbonate 24, BUN 8, serum creatinine 0.77.  Ultrasound of the scrotum was negative for torsion but showed asymmetric enlargement of the left testicle and left epididymitis.  The patient was given a dose of levofloxacin   Assessment &  Plan:   Principal Problem:   Epididymoorchitis Active Problems:   --Severe pulmonary HTN, 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 ----Pulmonary HTN    Hyperlipidemia   Renal artery stenosis (HCC)   Peripheral vascular disease, unspecified (HCC)   Atrial fibrillation (HCC)   Acquired thrombophilia (HCC)   Chronic heart failure with preserved ejection fraction (HFpEF) (HCC)   Acute epididymoorchitis -The patient has had numerous recent antibiotic exposure predisposing him to MDR organism -Start Zosyn -Trying to avoid quinolones at this time given drug interaction with warfarin -Urine culture positive for E. coli -UA> 50 WBC -Transition to Augmentin in the next 24 hours if he shows improvement   chronic HFpEF Appears clinically euvolemic Discharge weight 198.2 lbs on 11/09/21 Daily weight`` Restart PTA torsemide dose 10 mg qod   Severe Pulmonary Hypertension  - Pt is being followed by advanced HF clinic -Moderate PH on RHC in 1/22 - Chronic PEs on 7/22 V/Q scan.  - Dr. Aundra Dubin outpatient follow up after discharge    Permanent Atrial Fibrillation - HR well controlled, pt is anticoagulated with warfarin -Pharmacy consult for warfarin management in hospital -Following daily PT/INR   Hyperlipidemia - resumed home statin   Type 2 diabetes mellitus, controlled with vascular complications  -SSI coverage and frequent CBG monitoring in addition to prandial NovoLog ordered -hold metformin - 08/29/21 A1C--6.5   Esophageal dysphagia  -Patient reports that he is scheduled to have a barium swallow evaluation at [facility] on 8/8.  -SLP saw pt last admission -SLP recommends regular diet with thin liquids   CAD s/p CABG at age 72 -  he is being medically managed now and we have resumed his cardiac medications - Cath in 1/22 showed patent LIMA-LAD and SVG-D but occluded SVG-OM and SVG-RCA -continue imdur and metoprolol succinate and statin   chronic respiratory failure with hypoxia -on 3L at night at  home   Essential hypertension  - resuming home medication and follow closely    PVD - severe  - he has had multiple bypass surgeries - resume medical management  - Occluded right fem-pop bypass.  No claudication - continue ASA  Constipation -He did not have any improvement after Dulcolax suppository -We will try enema   DVT prophylaxis: warfarin  Code Status: full code Family Communication: no family present Disposition Plan: Status is: Inpatient Remains inpatient appropriate because: continued IV antibiotics     Consultants:    Procedures:    Antimicrobials:  Zosyn 8/29>    Subjective: Did not have bowel movement after suppository yesterday.  Continue to have pain in testicles.  Objective: Vitals:   11/29/21 2152 11/30/21 0333 11/30/21 1000 11/30/21 1215  BP: 98/84 (!) 114/58 (!) 121/58 (!) 127/103  Pulse: (!) 58 (!) 47 (!) 49 60  Resp: '18 18 19 20  '$ Temp: 99.9 F (37.7 C) 98.1 F (36.7 C)  98.2 F (36.8 C)  TempSrc:    Oral  SpO2: 93% 98%  96%  Weight:      Height:        Intake/Output Summary (Last 24 hours) at 11/30/2021 1931 Last data filed at 11/30/2021 1900 Gross per 24 hour  Intake 240 ml  Output 2500 ml  Net -2260 ml   Filed Weights   11/28/21 0348  Weight: 89.9 kg    Examination:  General exam: Appears calm and comfortable  Respiratory system: Clear to auscultation. Respiratory effort normal. Cardiovascular system: S1 & S2 heard, RRR. No JVD, murmurs, rubs, gallops or clicks. No pedal edema. Gastrointestinal system: Abdomen is nondistended, soft and nontender. No organomegaly or masses felt. Normal bowel sounds heard. GU: continues to have painful and erythematous left testicle Central nervous system: Alert and oriented. No focal neurological deficits. Extremities: Symmetric 5 x 5 power. Skin: No rashes, lesions or ulcers Psychiatry: Judgement and insight appear normal. Mood & affect appropriate.     Data Reviewed: I have  personally reviewed following labs and imaging studies  CBC: Recent Labs  Lab 11/28/21 0357 11/29/21 0337 11/29/21 0757 11/30/21 0409  WBC 12.0* 13.6* 11.7* 9.9  NEUTROABS 9.8*  --   --   --   HGB 11.5* 11.0* 10.0* 9.8*  HCT 36.3* 35.6* 31.7* 31.3*  MCV 90.3 90.8 90.3 89.7  PLT 194 166 157 409   Basic Metabolic Panel: Recent Labs  Lab 11/28/21 0357 11/29/21 0337 11/30/21 0409  NA 138 136 136  K 3.6 3.8 3.5  CL 104 104 104  CO2 '24 23 26  '$ GLUCOSE 126* 123* 111*  BUN '8 14 18  '$ CREATININE 0.77 0.90 1.07  CALCIUM 8.7* 8.6* 8.1*   GFR: Estimated Creatinine Clearance: 64.2 mL/min (by C-G formula based on SCr of 1.07 mg/dL). Liver Function Tests: No results for input(s): "AST", "ALT", "ALKPHOS", "BILITOT", "PROT", "ALBUMIN" in the last 168 hours. No results for input(s): "LIPASE", "AMYLASE" in the last 168 hours. No results for input(s): "AMMONIA" in the last 168 hours. Coagulation Profile: Recent Labs  Lab 11/28/21 0357 11/29/21 0337 11/30/21 0409  INR 3.1* 3.0* 2.9*   Cardiac Enzymes: No results for input(s): "CKTOTAL", "CKMB", "CKMBINDEX", "TROPONINI" in the last 168 hours.  BNP (last 3 results) No results for input(s): "PROBNP" in the last 8760 hours. HbA1C: No results for input(s): "HGBA1C" in the last 72 hours. CBG: Recent Labs  Lab 11/29/21 1137 11/29/21 1621 11/29/21 2154 11/30/21 0715 11/30/21 1128  GLUCAP 156* 131* 149* 106* 114*   Lipid Profile: No results for input(s): "CHOL", "HDL", "LDLCALC", "TRIG", "CHOLHDL", "LDLDIRECT" in the last 72 hours. Thyroid Function Tests: No results for input(s): "TSH", "T4TOTAL", "FREET4", "T3FREE", "THYROIDAB" in the last 72 hours. Anemia Panel: No results for input(s): "VITAMINB12", "FOLATE", "FERRITIN", "TIBC", "IRON", "RETICCTPCT" in the last 72 hours. Sepsis Labs: No results for input(s): "PROCALCITON", "LATICACIDVEN" in the last 168 hours.  Recent Results (from the past 240 hour(s))  Urine Culture      Status: Abnormal   Collection Time: 11/28/21  3:58 AM   Specimen: Urine, Clean Catch  Result Value Ref Range Status   Specimen Description   Final    URINE, CLEAN CATCH Performed at [facility], [address] [place], [place] [address]    Special Requests   Final    NONE Performed at [facility], [address] [place], [place] [address]    Culture >=100,000 COLONIES/mL ESCHERICHIA COLI (A)  Final   Report Status 11/30/2021 FINAL  Final   Organism ID, Bacteria ESCHERICHIA COLI (A)  Final      Susceptibility   Escherichia coli - MIC*    AMPICILLIN 4 SENSITIVE Sensitive     CEFAZOLIN <=4 SENSITIVE Sensitive     CEFEPIME <=0.12 SENSITIVE Sensitive     CEFTRIAXONE <=0.25 SENSITIVE Sensitive     CIPROFLOXACIN <=0.25 SENSITIVE Sensitive     GENTAMICIN <=1 SENSITIVE Sensitive     IMIPENEM <=0.25 SENSITIVE Sensitive     NITROFURANTOIN <=16 SENSITIVE Sensitive     TRIMETH/SULFA <=20 SENSITIVE Sensitive     AMPICILLIN/SULBACTAM <=2 SENSITIVE Sensitive     PIP/TAZO <=4 SENSITIVE Sensitive     * >=100,000 COLONIES/mL ESCHERICHIA COLI         Radiology Studies: No results found.      Scheduled Meds:  amLODipine  10 mg Oral Daily   aspirin EC  81 mg Oral Q breakfast   atorvastatin  40 mg Oral Daily   famotidine  20 mg Oral Daily   isosorbide mononitrate  120 mg Oral QPM   losartan  25 mg Oral Daily   metoprolol succinate  25 mg Oral Daily   mirabegron ER  25 mg Oral Daily   pantoprazole  40 mg Oral QAC breakfast   tamsulosin  0.4 mg Oral QPC supper   torsemide  10 mg Oral QODAY   Warfarin - Pharmacist Dosing Inpatient   Does not apply q1600   Continuous Infusions:  piperacillin-tazobactam (ZOSYN)  IV 3.375 g (11/30/21 1855)     LOS: 1 day    Time spent: 80 mins    Kathie Dike, MD [facility]   If 7PM-7AM, please contact night-coverage [email or web address]  11/30/2021, 7:31 PM

## 2021-11-30 NOTE — Progress Notes (Signed)
[facility] for Warfarin Indication: atrial fibrillation  Allergies  Allergen Reactions   Niacin Itching and Rash    Burning sensation   Contrast Media [Iodinated Contrast Media] Nausea And Vomiting   Iodine-131 Nausea And Vomiting   Nsaids Other (See Comments)    Taking Coumadin    Patient Measurements: Height: '5\' 10"'$  (177.8 cm) Weight: 89.9 kg (198 lb 3.1 oz) IBW/kg (Calculated) : 73  Vital Signs: Temp: 98.1 F (36.7 C) (08/31 0333) BP: 114/58 (08/31 0333) Pulse Rate: 47 (08/31 0333)  Labs: Recent Labs    11/28/21 0357 11/29/21 0337 11/29/21 0757 11/30/21 0409  HGB 11.5* 11.0* 10.0* 9.8*  HCT 36.3* 35.6* 31.7* 31.3*  PLT 194 166 157 163  LABPROT 31.3* 30.9*  --  30.1*  INR 3.1* 3.0*  --  2.9*  CREATININE 0.77 0.90  --  1.07     Estimated Creatinine Clearance: 64.2 mL/min (by C-G formula based on SCr of 1.07 mg/dL).   Medical History: Past Medical History:  Diagnosis Date   AAA (abdominal aortic aneurysm) ([place])    Followed by Dr. Sherren Mocha Early   Arthritis    CAD (coronary artery disease)    a. CABG 2000.   Cancer [facility])    Prostate:  Radiation Tx   Carotid artery disease ([place])    Chest pain    precordial. mild chronic .[name]... nonischemic   CHF (congestive heart failure) (HCC)    Chronic edema    Coronary artery disease    a. Nuclear, January, 2008, no ischemia b. Cath 08/2012- 1/4 patent grafts, RCA CTO, no flow-limiting disease, medically managed   Diabetes mellitus without complication ([place])    Dizziness 02/2011   Dyslipidemia    Fall    GERD (gastroesophageal reflux disease)    TAKES TUMS & ROLAIDS AS NEEDED   Hx of CABG 2000   Hypertension    Myocardial infarction ([place]) 1999   Neck pain 02/2011   Pneumonia    Pulmonary hypertension ([place])    Renal artery stenosis (HCC)    50-70%   S/P femoropopliteal bypass surgery    Dr. Donnetta Hutching   Sinus bradycardia    Asymptomatic    Medications:  See  meds  Assessment: 81 year old male with a history of coronary artery disease, permanent atrial fibrillation on warfarin, HFpEF, severe pulmonary hypertension, diabetes mellitus type 2, renal artery stenosis, hyperlipidemia, hypertension, esophageal dysphagia, peripheral vascular disease, chronic respiratory failure on nighttime oxygen presenting with 1 day history of left-sided testicular pain and edema. Pharmacy asked to manage warfarin.  Home dose is '2mg'$  daily per last office visit 11/24/21   INR 3.1 > 3.0> 2.9, therapeutic  Goal of Therapy:  INR 2-3 Monitor platelets by anticoagulation protocol: Yes   Plan:  Warfarin 2 mg x 1 dose Daily PT-INR Monitor CBC and for bleeding  Margot Ables, PharmD Clinical Pharmacist 11/30/2021 8:02 AM

## 2021-11-30 NOTE — Progress Notes (Signed)
Enema given. Pt leaked light brown watery stool. After enema finished pt had large brown soft formed stool. Pt sitting on toilet at this time. Bed linens changed, pt cleaned with clean gown.

## 2021-11-30 NOTE — Progress Notes (Signed)
PT has been up and down urinating today. States has to stand up with the external cath or it doesn't flow. Pt has walker at bedside. Pt aware CT ordered and will come get him when they can. NAD today.

## 2021-12-01 DIAGNOSIS — I5032 Chronic diastolic (congestive) heart failure: Secondary | ICD-10-CM | POA: Diagnosis not present

## 2021-12-01 DIAGNOSIS — I4819 Other persistent atrial fibrillation: Secondary | ICD-10-CM | POA: Diagnosis not present

## 2021-12-01 DIAGNOSIS — N453 Epididymo-orchitis: Secondary | ICD-10-CM | POA: Diagnosis not present

## 2021-12-01 DIAGNOSIS — N452 Orchitis: Secondary | ICD-10-CM | POA: Diagnosis not present

## 2021-12-01 LAB — CBC
HCT: 30.8 % — ABNORMAL LOW (ref 39.0–52.0)
Hemoglobin: 9.9 g/dL — ABNORMAL LOW (ref 13.0–17.0)
MCH: 28.9 pg (ref 26.0–34.0)
MCHC: 32.1 g/dL (ref 30.0–36.0)
MCV: 90.1 fL (ref 80.0–100.0)
Platelets: 149 10*3/uL — ABNORMAL LOW (ref 150–400)
RBC: 3.42 MIL/uL — ABNORMAL LOW (ref 4.22–5.81)
RDW: 16.4 % — ABNORMAL HIGH (ref 11.5–15.5)
WBC: 6.7 10*3/uL (ref 4.0–10.5)
nRBC: 0 % (ref 0.0–0.2)

## 2021-12-01 LAB — PROTIME-INR
INR: 2.9 — ABNORMAL HIGH (ref 0.8–1.2)
Prothrombin Time: 30.3 seconds — ABNORMAL HIGH (ref 11.4–15.2)

## 2021-12-01 MED ORDER — AMOXICILLIN-POT CLAVULANATE 875-125 MG PO TABS
1.0000 | ORAL_TABLET | Freq: Two times a day (BID) | ORAL | Status: DC
  Administered 2021-12-01 – 2021-12-02 (×3): 1 via ORAL
  Filled 2021-12-01 (×3): qty 1

## 2021-12-01 MED ORDER — WARFARIN SODIUM 1 MG PO TABS
1.0000 mg | ORAL_TABLET | Freq: Once | ORAL | Status: AC
  Administered 2021-12-01: 1 mg via ORAL
  Filled 2021-12-01: qty 1

## 2021-12-01 NOTE — Evaluation (Signed)
Physical Therapy Evaluation Patient Details Name: Troy [name] MRN: [id number] DOB: 10-24-1940 Today's Date: 12/01/2021  History of Present Illness  Troy [name] is a 81 year old male with a history of coronary artery disease, permanent atrial fibrillation on warfarin, HFpEF, severe pulmonary hypertension, diabetes mellitus type 2, renal artery stenosis, hyperlipidemia, hypertension, esophageal dysphagia, peripheral vascular disease, chronic respiratory failure on nighttime oxygen presenting with 1 day history of left-sided testicular pain and edema.  The patient was recently discharged from the hospital after a stay from 11/06/2021 to 11/09/2021 for acute on chronic diastolic CHF.  He was discharged home on his previous dose of torsemide 10 mg every 48 hours.  His discharge weight was 198.2 pounds.  The patient followed up in the urology clinic 11/13/2021.  For his chronic urinary frequency.  The patient was started on cephalexin which she states he has finished.  He is not exactly sure which that he finished.  Nevertheless, the patient had been doing fairly well until he noticed increasing pain and edema on the evening of 11/27/2021 in his left testicle.  He states that it progressed to the point where he was having difficulty standing up and walking.  He lives by himself.  He denies any recent injury or trauma.  He has not had fevers, chills, nausea, vomiting or diarrhea abdominal pain.  He states that lower extremity edema is about the same as usual.  He denies any chest pain or shortness of breath, coughing, hemoptysis.   Clinical Impression  Patient limited for functional mobility as stated below secondary to mostly pain but also BLE weakness, fatigue and impaired balance. Patient performs all mobility with slow labored movements secondary to back and testicular pain. He demonstrates good sitting balance and tolerance at EOB. He requires assist and RW to transfer standing and for several lateral steps at  bedside. He is limited by pain and fatigue. Patient will benefit from continued physical therapy in hospital and recommended venue below to increase strength, balance, endurance for safe ADLs and gait.        Recommendations for follow up therapy are one component of a multi-disciplinary discharge planning process, led by the attending physician.  Recommendations may be updated based on patient status, additional functional criteria and insurance authorization.  Follow Up Recommendations Skilled nursing-short term rehab (<3 hours/day) Can patient physically be transported by private vehicle: Yes    Assistance Recommended at Discharge Intermittent Supervision/Assistance  Patient can return home with the following  A little help with walking and/or transfers;A little help with bathing/dressing/bathroom;Help with stairs or ramp for entrance;Assistance with cooking/housework    Equipment Recommendations None recommended by PT  Recommendations for Other Services       Functional Status Assessment Patient has had a recent decline in their functional status and demonstrates the ability to make significant improvements in function in a reasonable and predictable amount of time.     Precautions / Restrictions Precautions Precautions: Fall Restrictions Weight Bearing Restrictions: No      Mobility  Bed Mobility Overal bed mobility: Needs Assistance Bed Mobility: Supine to Sit     Supine to sit: Min assist, Min guard     General bed mobility comments: assist to pull to seated EOB    Transfers Overall transfer level: Needs assistance Equipment used: Rolling walker (2 wheels) Transfers: Sit to/from Stand Sit to Stand: Min guard, Min assist           General transfer comment: slow, labored due to pain  Ambulation/Gait Ambulation/Gait assistance: Min guard, Min assist Gait Distance (Feet): 3 Feet Assistive device: Rolling walker (2 wheels)   Gait velocity: decreased      General Gait Details: limited to a few lateral steps at bedside due to pain  Stairs            Wheelchair Mobility    Modified Rankin (Stroke Patients Only)       Balance Overall balance assessment: Needs assistance Sitting-balance support: Feet supported, No upper extremity supported Sitting balance-Leahy Scale: Good Sitting balance - Comments: seated at EOB   Standing balance support: During functional activity, Bilateral upper extremity supported Standing balance-Leahy Scale: Fair Standing balance comment: fair/good using RW                             Pertinent Vitals/Pain Pain Assessment Pain Assessment: Faces Faces Pain Scale: Hurts whole lot Pain Location: back and testicles Pain Descriptors / Indicators: Sharp Pain Intervention(s): Limited activity within patient's tolerance, Monitored during session, Repositioned    Home Living Family/patient expects to be discharged to:: Private residence Living Arrangements: Alone Available Help at Discharge: Family;Available PRN/intermittently Type of Home: House Home Access: Ramped entrance       Home Layout: One level Home Equipment: Conservation officer, nature (2 wheels);Wheelchair - manual;Shower seat - built in;Shower seat;Grab bars - tub/shower      Prior Function Prior Level of Function : Independent/Modified Independent             Mobility Comments: Household and  short distanced community ambulator using RW, drives ADLs Comments: Independent     Hand Dominance        Extremity/Trunk Assessment   Upper Extremity Assessment Upper Extremity Assessment: Generalized weakness    Lower Extremity Assessment Lower Extremity Assessment: Generalized weakness    Cervical / Trunk Assessment Cervical / Trunk Assessment: Normal  Communication   Communication: No difficulties  Cognition Arousal/Alertness: Awake/alert Behavior During Therapy: WFL for tasks assessed/performed Overall Cognitive  Status: Within Functional Limits for tasks assessed                                          General Comments      Exercises     Assessment/Plan    PT Assessment Patient needs continued PT services  PT Problem List Decreased strength;Decreased activity tolerance;Decreased balance;Decreased mobility;Pain       PT Treatment Interventions DME instruction;Gait training;Stair training;Functional mobility training;Therapeutic activities;Therapeutic exercise;Balance training    PT Goals (Current goals can be found in the Care Plan section)  Acute Rehab PT Goals Patient Stated Goal: feel better PT Goal Formulation: With patient Time For Goal Achievement: 12/15/21 Potential to Achieve Goals: Good    Frequency Min 3X/week     Co-evaluation               AM-PAC PT "6 Clicks" Mobility  Outcome Measure Help needed turning from your back to your side while in a flat bed without using bedrails?: A Little Help needed moving from lying on your back to sitting on the side of a flat bed without using bedrails?: A Little Help needed moving to and from a bed to a chair (including a wheelchair)?: A Lot Help needed standing up from a chair using your arms (e.g., wheelchair or bedside chair)?: A Lot Help needed to walk in hospital room?: A  Lot Help needed climbing 3-5 steps with a railing? : A Lot 6 Click Score: 14    End of Session Equipment Utilized During Treatment: Gait belt Activity Tolerance: Patient limited by fatigue;Patient limited by pain Patient left: in bed Nurse Communication: Mobility status PT Visit Diagnosis: Unsteadiness on feet (R26.81);Other abnormalities of gait and mobility (R26.89);Muscle weakness (generalized) (M62.81)    Time: 7331-2508 PT Time Calculation (min) (ACUTE ONLY): 17 min   Charges:   PT Evaluation $PT Eval Low Complexity: 1 Low PT Treatments $Therapeutic Activity: 8-22 mins        12:09 PM, 12/01/21 Mearl Latin  PT, DPT Physical Therapist at [facility]

## 2021-12-01 NOTE — NC FL2 (Signed)
[place] LEVEL OF CARE SCREENING TOOL     IDENTIFICATION  Patient Name: Troy [name] Birthdate: 02/02/1941 Sex: male Admission Date (Current Location): 11/28/2021  [facility] and [place] Number:  [employer] and Address:  [place] [facility] [address], [place]      Provider Number: [id number]  Attending Physician Name and Address:  Kathie Dike, MD  Relative Name and Phone Number:       Current Level of Care: Hospital Recommended Level of Care: [place] Prior Approval Number:    Date Approved/Denied:   PASRR Number: [id number]  Discharge Plan: SNF    Current Diagnoses: Patient Active Problem List   Diagnosis Date Noted   Epididymoorchitis 11/28/2021   Chronic heart failure with preserved ejection fraction (HFpEF) ([place]) 11/28/2021   Acute heart failure with preserved ejection fraction (HFpEF) ([place]) 11/06/2021   Acquired thrombophilia ([place]) 11/06/2021   Upper airway cough syndrome 02/04/2021   Nocturnal hypoxemia 06/27/2020   DOE (dyspnea on exertion) 06/07/2020   Rt Sided Community acquired pneumonia 03/26/2020   Microcytic anemia 03/26/2020   --Severe pulmonary HTN, 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 ----Pulmonary HTN  03/26/2020   Sepsis due to Rt Sided Pneumonia 03/26/2020   Atrial fibrillation ([place]) 12/24/2014   Bowel habit changes 09/16/2014   Erosive esophagitis 02/11/2014   Abdominal aneurysm without mention of rupture 07/28/2013   Peripheral vascular disease, unspecified ([place]) 07/28/2013   Esophageal dysphagia 07/21/2013   CAD (coronary artery disease)    Type 2 diabetes mellitus ([place]) 09/27/2012   Hypertension 09/27/2012   Atherosclerosis of native arteries of the extremities with intermittent claudication 02/12/2012   Limb swelling 02/12/2012   Chronic total occlusion of artery of the extremities ([place]) 08/07/2011   Neck pain    Dizziness    Carotid artery disease ([place])    Renal artery stenosis (HCC)     S/P femoropopliteal bypass surgery    Ejection fraction    Sinus bradycardia    Hyperlipidemia 01/04/2010   EDEMA 07/04/2008   CORONARY ARTERY BYPASS GRAFT, HX OF 07/04/2008    Orientation RESPIRATION BLADDER Height & Weight     Time, Self, Situation, Place  O2 (O2 at night) External catheter Weight: 198 lb 3.1 oz (89.9 kg) Height:  '5\' 10"'$  (177.8 cm)  BEHAVIORAL SYMPTOMS/MOOD NEUROLOGICAL BOWEL NUTRITION STATUS      Continent Diet (Heart healthy. See d/c summary for updates.)  AMBULATORY STATUS COMMUNICATION OF NEEDS Skin   Extensive Assist Verbally Skin abrasions                       Personal Care Assistance Level of Assistance  Bathing, Feeding, Dressing Bathing Assistance: Maximum assistance Feeding assistance: Limited assistance Dressing Assistance: Maximum assistance     Functional Limitations Info  Sight, Hearing, Speech Sight Info: Impaired Hearing Info: Impaired Speech Info: Adequate    SPECIAL CARE FACTORS FREQUENCY  PT (By licensed PT)     PT Frequency: 5x weekly              Contractures      Additional Factors Info  Code Status, Allergies Code Status Info: Full code Allergies Info: Niacin, Contrast Media (iodinated Contrast Media), Iodine-131, Nsaids           Current Medications (12/01/2021):  This is the current hospital active medication list Current Facility-Administered Medications  Medication Dose Route Frequency Provider Last Rate Last Admin   acetaminophen (TYLENOL) tablet 650 mg  650  mg Oral Q6H PRN Orson Eva, MD   650 mg at 11/30/21 2007   Or   acetaminophen (TYLENOL) suppository 650 mg  650 mg Rectal Q6H PRN Tat, Shanon Brow, MD       amLODipine (NORVASC) tablet 10 mg  10 mg Oral Daily Tat, David, MD   10 mg at 12/01/21 0932   aspirin EC tablet 81 mg  81 mg Oral Q breakfast Tat, Shanon Brow, MD   81 mg at 12/01/21 0933   atorvastatin (LIPITOR) tablet 40 mg  40 mg Oral Daily Tat, Shanon Brow, MD   40 mg at 12/01/21 0932   famotidine (PEPCID)  tablet 20 mg  20 mg Oral Daily Tat, David, MD   20 mg at 12/01/21 0933   ipratropium-albuterol (DUONEB) 0.5-2.5 (3) MG/3ML nebulizer solution 3 mL  3 mL Nebulization Q4H PRN Tat, Shanon Brow, MD       isosorbide mononitrate (IMDUR) 24 hr tablet 120 mg  120 mg Oral QPM Tat, Shanon Brow, MD   120 mg at 11/30/21 1854   losartan (COZAAR) tablet 25 mg  25 mg Oral Daily Tat, Shanon Brow, MD   25 mg at 12/01/21 0932   metoprolol succinate (TOPROL-XL) 24 hr tablet 25 mg  25 mg Oral Daily Tat, David, MD   25 mg at 12/01/21 0932   mirabegron ER (MYRBETRIQ) tablet 25 mg  25 mg Oral Daily Tat, David, MD   25 mg at 12/01/21 0932   ondansetron (ZOFRAN) tablet 4 mg  4 mg Oral Q6H PRN Tat, Shanon Brow, MD       Or   ondansetron (ZOFRAN) injection 4 mg  4 mg Intravenous Q6H PRN Tat, Shanon Brow, MD       oxyCODONE (Oxy IR/ROXICODONE) immediate release tablet 5 mg  5 mg Oral Q6H PRN Tat, Shanon Brow, MD   5 mg at 12/01/21 0932   pantoprazole (PROTONIX) EC tablet 40 mg  40 mg Oral QAC breakfast Tat, Shanon Brow, MD   40 mg at 12/01/21 0933   piperacillin-tazobactam (ZOSYN) IVPB 3.375 g  3.375 g Intravenous Franco Collet, MD 12.5 mL/hr at 12/01/21 1040 3.375 g at 12/01/21 1040   tamsulosin (FLOMAX) capsule 0.4 mg  0.4 mg Oral QPC supper Tat, Shanon Brow, MD   0.4 mg at 11/30/21 1855   torsemide (DEMADEX) tablet 10 mg  10 mg Oral Roberto Scales, MD   10 mg at 12/01/21 0932   warfarin (COUMADIN) tablet 1 mg  1 mg Oral ONCE-1600 Kathie Dike, MD       Warfarin - Pharmacist Dosing Inpatient   Does not apply [id number] Kathie Dike, MD         Discharge Medications: Please see discharge summary for a list of discharge medications.  Relevant Imaging Results:  Relevant Lab Results:   Additional Information SSN: [id number]  Salome Arnt, LCSW

## 2021-12-01 NOTE — Progress Notes (Signed)
PROGRESS NOTE    Troy [name]  IRW:[id number] DOB: July 28, 1940 DOA: 11/28/2021 PCP: Sharilyn Sites, MD    Brief Narrative:  Troy [name] is a 81 year old male with a history of coronary artery disease, permanent atrial fibrillation on warfarin, HFpEF, severe pulmonary hypertension, diabetes mellitus type 2, renal artery stenosis, hyperlipidemia, hypertension, esophageal dysphagia, peripheral vascular disease, chronic respiratory failure on nighttime oxygen presenting with 1 day history of left-sided testicular pain and edema.  The patient was recently discharged from the hospital after a stay from 11/06/2021 to 11/09/2021 for acute on chronic diastolic CHF.  He was discharged home on his previous dose of torsemide 10 mg every 48 hours.  His discharge weight was 198.2 pounds.  The patient followed up in the urology clinic 11/13/2021.  For his chronic urinary frequency.  The patient was started on cephalexin which she states he has finished.  He is not exactly sure which that he finished.  Nevertheless, the patient had been doing fairly well until he noticed increasing pain and edema on the evening of 11/27/2021 in his left testicle.  He states that it progressed to the point where he was having difficulty standing up and walking.  He lives by himself.  He denies any recent injury or trauma.  He has not had fevers, chills, nausea, vomiting or diarrhea abdominal pain.  He states that lower extremity edema is about the same as usual.  He denies any chest pain or shortness of breath, coughing, hemoptysis. In the ED, the patient was afebrile and hemodynamically stable with oxygen saturation 97% on room air.  WBC 12.0, hemoglobin 11.5, platelets 194,000.  Sodium 138, potassium 3.6, bicarbonate 24, BUN 8, serum creatinine 0.77.  Ultrasound of the scrotum was negative for torsion but showed asymmetric enlargement of the left testicle and left epididymitis.  The patient was given a dose of levofloxacin   Assessment &  Plan:   Principal Problem:   Epididymoorchitis Active Problems:   --Severe pulmonary HTN, 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 ----Pulmonary HTN    Hyperlipidemia   Renal artery stenosis (HCC)   Peripheral vascular disease, unspecified (HCC)   Atrial fibrillation (HCC)   Acquired thrombophilia (HCC)   Chronic heart failure with preserved ejection fraction (HFpEF) (HCC)   Acute epididymoorchitis -The patient has had numerous recent antibiotic exposure predisposing him to MDR organism -Start Zosyn -Trying to avoid quinolones at this time given drug interaction with warfarin -Urine culture positive for E. coli -UA> 50 WBC -Transition to Augmentin    chronic HFpEF Appears clinically euvolemic Discharge weight 198.2 lbs on 11/09/21 Daily weight`` Restart PTA torsemide dose 10 mg qod   Severe Pulmonary Hypertension  - Pt is being followed by advanced HF clinic -Moderate PH on RHC in 1/22 - Chronic PEs on 7/22 V/Q scan.  - Dr. Aundra Dubin outpatient follow up after discharge    Permanent Atrial Fibrillation - HR well controlled, pt is anticoagulated with warfarin -Pharmacy consult for warfarin management in hospital -Following daily PT/INR   Hyperlipidemia - resumed home statin   Type 2 diabetes mellitus, controlled with vascular complications  -SSI coverage and frequent CBG monitoring in addition to prandial NovoLog ordered -hold metformin - 08/29/21 A1C--6.5   Esophageal dysphagia  -Patient had a barium swallow evaluation at [facility] on 8/8.  -SLP saw pt last admission -SLP recommends regular diet with thin liquids -Will need patient GI follow-up to consider endoscopy for esophageal dilatation   CAD s/p CABG at age 72 - he is being  medically managed now and we have resumed his cardiac medications - Cath in 1/22 showed patent LIMA-LAD and SVG-D but occluded SVG-OM and SVG-RCA -continue imdur and metoprolol succinate and statin   chronic respiratory failure with hypoxia -on 3L at night at  home   Essential hypertension  - resuming home medication and follow closely    PVD - severe  - he has had multiple bypass surgeries - resume medical management  - Occluded right fem-pop bypass.  No claudication - continue ASA  Constipation -He did not have any improvement after Dulcolax suppository -Constipation resolved after enema  Generalized weakness -Skilled nursing facility placement   DVT prophylaxis: warfarin  Code Status: full code Family Communication: no family present Disposition Plan: Status is: Inpatient Remains inpatient appropriate because: continued IV antibiotics     Consultants:    Procedures:    Antimicrobials:  Zosyn 8/29> 9/1 Augmentin 9/1 >   Subjective: Continues to have testicular pain although feels that it may be a little better.  He did have a bowel movement after enema yesterday.  Continues to feel generally weak  Objective: Vitals:   11/30/21 1215 11/30/21 2048 12/01/21 0542 12/01/21 1419  BP: (!) 127/103 (!) 122/50 (!) 138/54 (!) 123/45  Pulse: 60 (!) 110 (!) 45 (!) 54  Resp: '20 18 16 18  '$ Temp: 98.2 F (36.8 C) 98.7 F (37.1 C) 97.9 F (36.6 C) 98 F (36.7 C)  TempSrc: Oral Oral Oral Oral  SpO2: 96% 98% 95% 93%  Weight:      Height:        Intake/Output Summary (Last 24 hours) at 12/01/2021 1937 Last data filed at 12/01/2021 1200 Gross per 24 hour  Intake 520 ml  Output 300 ml  Net 220 ml   Filed Weights   11/28/21 0348  Weight: 89.9 kg    Examination:  General exam: Appears calm and comfortable  Respiratory system: Clear to auscultation. Respiratory effort normal. Cardiovascular system: S1 & S2 heard, RRR. No JVD, murmurs, rubs, gallops or clicks. No pedal edema. Gastrointestinal system: Abdomen is nondistended, soft and nontender. No organomegaly or masses felt. Normal bowel sounds heard. GU: Erythema and swelling over left testicle appears to be improving Central nervous system: Alert and oriented. No focal  neurological deficits. Extremities: Symmetric 5 x 5 power. Skin: No rashes, lesions or ulcers Psychiatry: Judgement and insight appear normal. Mood & affect appropriate.     Data Reviewed: I have personally reviewed following labs and imaging studies  CBC: Recent Labs  Lab 11/28/21 0357 11/29/21 0337 11/29/21 0757 11/30/21 0409 11/30/21 2316 12/01/21 0635  WBC 12.0* 13.6* 11.7* 9.9  --  6.7  NEUTROABS 9.8*  --   --   --   --   --   HGB 11.5* 11.0* 10.0* 9.8* 10.1* 9.9*  HCT 36.3* 35.6* 31.7* 31.3* 32.1* 30.8*  MCV 90.3 90.8 90.3 89.7  --  90.1  PLT 194 166 157 163  --  425*   Basic Metabolic Panel: Recent Labs  Lab 11/28/21 0357 11/29/21 0337 11/30/21 0409  NA 138 136 136  K 3.6 3.8 3.5  CL 104 104 104  CO2 '24 23 26  '$ GLUCOSE 126* 123* 111*  BUN '8 14 18  '$ CREATININE 0.77 0.90 1.07  CALCIUM 8.7* 8.6* 8.1*   GFR: Estimated Creatinine Clearance: 64.2 mL/min (by C-G formula based on SCr of 1.07 mg/dL). Liver Function Tests: No results for input(s): "AST", "ALT", "ALKPHOS", "BILITOT", "PROT", "ALBUMIN" in the last 168 hours. No  results for input(s): "LIPASE", "AMYLASE" in the last 168 hours. No results for input(s): "AMMONIA" in the last 168 hours. Coagulation Profile: Recent Labs  Lab 11/28/21 0357 11/29/21 0337 11/30/21 0409 12/01/21 0459  INR 3.1* 3.0* 2.9* 2.9*   Cardiac Enzymes: No results for input(s): "CKTOTAL", "CKMB", "CKMBINDEX", "TROPONINI" in the last 168 hours. BNP (last 3 results) No results for input(s): "PROBNP" in the last 8760 hours. HbA1C: No results for input(s): "HGBA1C" in the last 72 hours. CBG: Recent Labs  Lab 11/29/21 1137 11/29/21 1621 11/29/21 2154 11/30/21 0715 11/30/21 1128  GLUCAP 156* 131* 149* 106* 114*   Lipid Profile: No results for input(s): "CHOL", "HDL", "LDLCALC", "TRIG", "CHOLHDL", "LDLDIRECT" in the last 72 hours. Thyroid Function Tests: No results for input(s): "TSH", "T4TOTAL", "FREET4", "T3FREE",  "THYROIDAB" in the last 72 hours. Anemia Panel: No results for input(s): "VITAMINB12", "FOLATE", "FERRITIN", "TIBC", "IRON", "RETICCTPCT" in the last 72 hours. Sepsis Labs: No results for input(s): "PROCALCITON", "LATICACIDVEN" in the last 168 hours.  Recent Results (from the past 240 hour(s))  Urine Culture     Status: Abnormal   Collection Time: 11/28/21  3:58 AM   Specimen: Urine, Clean Catch  Result Value Ref Range Status   Specimen Description   Final    URINE, CLEAN CATCH Performed at [facility], [address], [place], [place] [address]    Special Requests   Final    NONE Performed at [facility], [address] [place],  [address]    Culture >=100,000 COLONIES/mL ESCHERICHIA COLI (A)  Final   Report Status 11/30/2021 FINAL  Final   Organism ID, Bacteria ESCHERICHIA COLI (A)  Final      Susceptibility   Escherichia coli - MIC*    AMPICILLIN 4 SENSITIVE Sensitive     CEFAZOLIN <=4 SENSITIVE Sensitive     CEFEPIME <=0.12 SENSITIVE Sensitive     CEFTRIAXONE <=0.25 SENSITIVE Sensitive     CIPROFLOXACIN <=0.25 SENSITIVE Sensitive     GENTAMICIN <=1 SENSITIVE Sensitive     IMIPENEM <=0.25 SENSITIVE Sensitive     NITROFURANTOIN <=16 SENSITIVE Sensitive     TRIMETH/SULFA <=20 SENSITIVE Sensitive     AMPICILLIN/SULBACTAM <=2 SENSITIVE Sensitive     PIP/TAZO <=4 SENSITIVE Sensitive     * >=100,000 COLONIES/mL ESCHERICHIA COLI         Radiology Studies: No results found.      Scheduled Meds:  amLODipine  10 mg Oral Daily   amoxicillin-clavulanate  1 tablet Oral Q12H   aspirin EC  81 mg Oral Q breakfast   atorvastatin  40 mg Oral Daily   famotidine  20 mg Oral Daily   isosorbide mononitrate  120 mg Oral QPM   losartan  25 mg Oral Daily   metoprolol succinate  25 mg Oral Daily   mirabegron ER  25 mg Oral Daily   pantoprazole  40 mg Oral QAC breakfast   tamsulosin  0.4 mg Oral QPC supper   torsemide  10 mg Oral QODAY   Warfarin - Pharmacist Dosing  Inpatient   Does not apply q1600   Continuous Infusions:     LOS: 2 days    Time spent: 35 mins    Kathie Dike, MD [facility]   If 7PM-7AM, please contact night-coverage [email or web address]  12/01/2021, 7:37 PM

## 2021-12-01 NOTE — Plan of Care (Signed)
  Problem: Acute Rehab PT Goals(only PT should resolve) Goal: Patient Will Transfer Sit To/From Stand Outcome: Progressing Flowsheets (Taken 12/01/2021 1210) Patient will transfer sit to/from stand: with min guard assist Goal: Pt Will Transfer Bed To Chair/Chair To Bed Outcome: Progressing Flowsheets (Taken 12/01/2021 1210) Pt will Transfer Bed to Chair/Chair to Bed: min guard assist Goal: Pt Will Ambulate Outcome: Progressing Flowsheets (Taken 12/01/2021 1210) Pt will Ambulate:  100 feet  with min guard assist  with least restrictive assistive device Goal: Pt/caregiver will Perform Home Exercise Program Outcome: Progressing Flowsheets (Taken 12/01/2021 1210) Pt/caregiver will Perform Home Exercise Program:  For increased strengthening  For improved balance  Independently  12:11 PM, 12/01/21 Mearl Latin PT, DPT Physical Therapist at [facility]

## 2021-12-01 NOTE — Progress Notes (Signed)
[place] for Warfarin Indication: atrial fibrillation  Allergies  Allergen Reactions   Niacin Itching and Rash    Burning sensation   Contrast Media [Iodinated Contrast Media] Nausea And Vomiting   Iodine-131 Nausea And Vomiting   Nsaids Other (See Comments)    Taking Coumadin    Patient Measurements: Height: '5\' 10"'$  (177.8 cm) Weight: 89.9 kg (198 lb 3.1 oz) IBW/kg (Calculated) : 73  Vital Signs: Temp: 97.9 F (36.6 C) (09/01 0542) Temp Source: Oral (09/01 0542) BP: 138/54 (09/01 0542) Pulse Rate: 45 (09/01 0542)  Labs: Recent Labs    11/29/21 0337 11/29/21 0757 11/30/21 0409 11/30/21 2316 12/01/21 0459 12/01/21 0635  HGB 11.0* 10.0* 9.8* 10.1*  --  9.9*  HCT 35.6* 31.7* 31.3* 32.1*  --  30.8*  PLT 166 157 163  --   --  149*  LABPROT 30.9*  --  30.1*  --  30.3*  --   INR 3.0*  --  2.9*  --  2.9*  --   CREATININE 0.90  --  1.07  --   --   --      Estimated Creatinine Clearance: 64.2 mL/min (by C-G formula based on SCr of 1.07 mg/dL).   Medical History: Past Medical History:  Diagnosis Date   AAA (abdominal aortic aneurysm) ([place])    Followed by Dr. Sherren Mocha Early   Arthritis    CAD (coronary artery disease)    a. CABG 2000.   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 mild chronic .[name]... nonischemic   CHF (congestive heart failure) (HCC)    Chronic edema    Coronary artery disease    a. Nuclear, January, 2008, no ischemia b. Cath 08/2012- 1/4 patent grafts, RCA CTO, no flow-limiting disease, medically managed   Diabetes mellitus without complication ([place])    Dizziness 02/2011   Dyslipidemia    Fall    GERD (gastroesophageal reflux disease)    TAKES TUMS & ROLAIDS AS NEEDED   Hx of CABG 2000   Hypertension    Myocardial infarction ([place]) 1999   Neck pain 02/2011   Pneumonia    Pulmonary hypertension ([place])    Renal artery stenosis (HCC)    50-70%   S/P  femoropopliteal bypass surgery    Dr. Donnetta Hutching   Sinus bradycardia    Asymptomatic    Medications:  See meds  Assessment: 81 year old male with a history of coronary artery disease, permanent atrial fibrillation on warfarin, HFpEF, severe pulmonary hypertension, diabetes mellitus type 2, renal artery stenosis, hyperlipidemia, hypertension, esophageal dysphagia, peripheral vascular disease, chronic respiratory failure on nighttime oxygen presenting with 1 day history of left-sided testicular pain and edema. Pharmacy asked to manage warfarin.  Home dose is '2mg'$  daily per last office visit 11/24/21   INR 3.1 > 3.0> 2.9> 2.9 , therapeutic  Goal of Therapy:  INR 2-3 Monitor platelets by anticoagulation protocol: Yes   Plan:  Warfarin 1 mg x 1 dose Daily PT-INR Monitor CBC and for bleeding  Isac Sarna, BS Pharm D, BCPS Clinical Pharmacist 12/01/2021 9:03 AM

## 2021-12-01 NOTE — TOC Progression Note (Signed)
Transition of Care [facility]) - Progression Note    Patient Details  Name: Troy [name] MRN: [id number] Date of Birth: 1940-12-25  Transition [facility]) CM/SW Contact  Salome Arnt, [place] Phone Number: 12/01/2021, 2:46 PM  Clinical Narrative: PT recommending SNF. Discussed with pt who is agreeable and requested [facility] or [facility]. Pt notified neither facility has [place] contract and he states he doesn't want to go too far away, so will use Medicare benefits. PNC full. [facility] can offer bed. Pt aware [facility] can take pt using VA benefits, but pt would rather go to [place]. CMA starting authorization.         Barriers to Discharge: Continued Medical Work up  Expected Discharge Plan and Services                                                 Social Determinants of Health (SDOH) Interventions    Readmission Risk Interventions    11/07/2021   12:48 PM  Readmission Risk Prevention Plan  Transportation Screening Complete  Home Care Screening Complete  Medication Review (RN CM) Complete

## 2021-12-02 DIAGNOSIS — I7143 Infrarenal abdominal aortic aneurysm, without rupture: Secondary | ICD-10-CM | POA: Diagnosis not present

## 2021-12-02 DIAGNOSIS — I739 Peripheral vascular disease, unspecified: Secondary | ICD-10-CM | POA: Diagnosis not present

## 2021-12-02 DIAGNOSIS — I272 Pulmonary hypertension, unspecified: Secondary | ICD-10-CM | POA: Diagnosis not present

## 2021-12-02 DIAGNOSIS — N452 Orchitis: Secondary | ICD-10-CM | POA: Diagnosis not present

## 2021-12-02 DIAGNOSIS — N453 Epididymo-orchitis: Secondary | ICD-10-CM | POA: Diagnosis not present

## 2021-12-02 DIAGNOSIS — E785 Hyperlipidemia, unspecified: Secondary | ICD-10-CM | POA: Diagnosis not present

## 2021-12-02 DIAGNOSIS — I10 Essential (primary) hypertension: Secondary | ICD-10-CM | POA: Diagnosis not present

## 2021-12-02 DIAGNOSIS — I4891 Unspecified atrial fibrillation: Secondary | ICD-10-CM | POA: Diagnosis not present

## 2021-12-02 DIAGNOSIS — Z7901 Long term (current) use of anticoagulants: Secondary | ICD-10-CM | POA: Diagnosis not present

## 2021-12-02 DIAGNOSIS — R52 Pain, unspecified: Secondary | ICD-10-CM | POA: Diagnosis not present

## 2021-12-02 DIAGNOSIS — I5032 Chronic diastolic (congestive) heart failure: Secondary | ICD-10-CM | POA: Diagnosis not present

## 2021-12-02 DIAGNOSIS — I25810 Atherosclerosis of coronary artery bypass graft(s) without angina pectoris: Secondary | ICD-10-CM | POA: Diagnosis not present

## 2021-12-02 DIAGNOSIS — E119 Type 2 diabetes mellitus without complications: Secondary | ICD-10-CM | POA: Diagnosis not present

## 2021-12-02 DIAGNOSIS — G4734 Idiopathic sleep related nonobstructive alveolar hypoventilation: Secondary | ICD-10-CM | POA: Diagnosis not present

## 2021-12-02 DIAGNOSIS — K219 Gastro-esophageal reflux disease without esophagitis: Secondary | ICD-10-CM | POA: Diagnosis not present

## 2021-12-02 DIAGNOSIS — I779 Disorder of arteries and arterioles, unspecified: Secondary | ICD-10-CM | POA: Diagnosis not present

## 2021-12-02 DIAGNOSIS — I4819 Other persistent atrial fibrillation: Secondary | ICD-10-CM | POA: Diagnosis not present

## 2021-12-02 DIAGNOSIS — N451 Epididymitis: Secondary | ICD-10-CM | POA: Diagnosis not present

## 2021-12-02 DIAGNOSIS — R5381 Other malaise: Secondary | ICD-10-CM | POA: Diagnosis not present

## 2021-12-02 LAB — BASIC METABOLIC PANEL
Anion gap: 7 (ref 5–15)
BUN: 15 mg/dL (ref 8–23)
CO2: 27 mmol/L (ref 22–32)
Calcium: 8.5 mg/dL — ABNORMAL LOW (ref 8.9–10.3)
Chloride: 104 mmol/L (ref 98–111)
Creatinine, Ser: 0.9 mg/dL (ref 0.61–1.24)
GFR, Estimated: >60 mL/min (ref >60)
Glucose, Bld: 109 mg/dL — ABNORMAL HIGH (ref 70–99)
Potassium: 3.6 mmol/L (ref 3.5–5.1)
Sodium: 138 mmol/L (ref 135–145)

## 2021-12-02 LAB — PROTIME-INR
INR: 2.8 — ABNORMAL HIGH (ref 0.8–1.2)
Prothrombin Time: 28.8 seconds — ABNORMAL HIGH (ref 11.4–15.2)

## 2021-12-02 MED ORDER — HYDROCODONE-ACETAMINOPHEN 5-325 MG PO TABS: 1.0000 | ORAL_TABLET | Freq: Every day | ORAL | 0 refills | Status: DC | PRN

## 2021-12-02 MED ORDER — WARFARIN SODIUM 2 MG PO TABS
2.0000 mg | ORAL_TABLET | Freq: Once | ORAL | Status: DC
Start: 2021-12-02 — End: 2021-12-02

## 2021-12-02 MED ORDER — AMOXICILLIN-POT CLAVULANATE 875-125 MG PO TABS: 1.0000 | ORAL_TABLET | Freq: Two times a day (BID) | ORAL | 0 refills | Status: AC

## 2021-12-02 NOTE — TOC Transition Note (Signed)
Transition of Care [facility]) - CM/SW Discharge Note   Patient Details  Name: Troy [name] MRN: [id number] Date of Birth: [birth year]  Transition of Care [facility]) CM/SW Contact:  Joanne Chars, LCSW Phone Number: 12/02/2021, 1:44 PM   Clinical Narrative:   Pt discharging to [facility], room 504-1.  RN call report to [phone].  Son Octavia Bruckner will provide transportation.     Final next level of care: Skilled Nursing Facility Barriers to Discharge: Barriers Resolved   Patient Goals and CMS Choice        Discharge Placement              Patient chooses bed at:  [facility]) Patient to be transferred to facility by: son Octavia Bruckner Name of family member notified: son Tim Patient and family notified of of transfer: 12/02/21  Discharge Plan and Services                                     Social Determinants of Health (SDOH) Interventions     Readmission Risk Interventions    11/07/2021   12:48 PM  Readmission Risk Prevention Plan  Transportation Screening Complete  Home Care Screening Complete  Medication Review (RN CM) Complete

## 2021-12-02 NOTE — Progress Notes (Signed)
Nsg Discharge Note  Admit Date:  11/28/2021 Discharge date: 12/02/2021   [facility] to be D/C'd Skilled nursing facility per MD order.  AVS completed.   Patient/caregiver able to verbalize understanding.  Discharge Medication: Allergies as of 12/02/2021       Reactions   Niacin Itching, Rash   Burning sensation   Contrast Media [iodinated Contrast Media] Nausea And Vomiting   Iodine-131 Nausea And Vomiting   Nsaids Other (See Comments)   Taking Coumadin        Medication List     TAKE these medications    acetaminophen 325 MG tablet Commonly known as: TYLENOL Take 2 tablets (650 mg total) by mouth every 6 (six) hours as needed for mild pain, fever or headache (or Fever >/= 101).   albuterol 108 (90 Base) MCG/ACT inhaler Commonly known as: VENTOLIN HFA Inhale 2 puffs into the lungs every 6 (six) hours as needed for wheezing or shortness of breath.   amLODipine 10 MG tablet Commonly known as: NORVASC Take 10 mg by mouth daily.   amoxicillin-clavulanate 875-125 MG tablet Commonly known as: AUGMENTIN Take 1 tablet by mouth every 12 (twelve) hours for 7 days.   aspirin EC 81 MG tablet Take 1 tablet (81 mg total) by mouth daily with breakfast.   atorvastatin 40 MG tablet Commonly known as: LIPITOR Take 1 tablet (40 mg total) by mouth daily.   famotidine 20 MG tablet Commonly known as: Pepcid One after supper What changed:  how much to take how to take this when to take this   Gemtesa 75 MG Tabs Generic drug: Vibegron Take 75 mg by mouth daily.   HYDROcodone-acetaminophen 5-325 MG tablet Commonly known as: NORCO/VICODIN Take 1 tablet by mouth daily as needed for moderate pain.   ipratropium 0.03 % nasal spray Commonly known as: ATROVENT Place 2 sprays into both nostrils as needed for rhinitis.   ipratropium-albuterol 0.5-2.5 (3) MG/3ML Soln Commonly known as: DUONEB Take 3 mLs by nebulization every 4 (four) hours as needed.   isosorbide mononitrate 60  MG 24 hr tablet Commonly known as: IMDUR Take 2 tablets (120 mg total) by mouth every evening.   losartan 25 MG tablet Commonly known as: COZAAR Take 1 tablet (25 mg total) by mouth daily.   metFORMIN 1000 MG tablet Commonly known as: GLUCOPHAGE Take 500 mg by mouth 2 (two) times daily with a meal.   metoprolol succinate 25 MG 24 hr tablet Commonly known as: TOPROL-XL Take 1 tablet (25 mg total) by mouth daily. Pt. Needs to make an appt. With Cardiologist in order to receive further refills. Thank You. 1st Attempt.   nitroGLYCERIN 0.4 MG SL tablet Commonly known as: NITROSTAT Place 1 tablet (0.4 mg total) under the tongue every 5 (five) minutes x 3 doses as needed for chest pain.   OXYGEN Inhale 3 L into the lungs at bedtime.   pantoprazole 40 MG tablet Commonly known as: PROTONIX Take 1 tablet (40 mg total) by mouth daily. Take 30-60 min before first meal of the day   potassium chloride 10 MEQ tablet Commonly known as: KLOR-CON M Take 1 tablet (10 mEq total) by mouth daily. Only take while taking Torsemide   tamsulosin 0.4 MG Caps capsule Commonly known as: FLOMAX Take 1 capsule (0.4 mg total) by mouth daily after supper.   torsemide 10 MG tablet Commonly known as: DEMADEX Take 1 tablet (10 mg total) by mouth every other day. Take 20 mg for weight gain over  3 lbs in 24 hours   warfarin 1 MG tablet Commonly known as: COUMADIN Take 2 mg by mouth every evening.        Discharge Assessment: Vitals:   12/01/21 2052 12/02/21 0401  BP: (!) 106/49 136/65  Pulse: (!) 53 62  Resp: 17 16  Temp: 97.8 F (36.6 C) 98.7 F (37.1 C)  SpO2: 92% 95%   Skin clean, dry and intact without evidence of skin break down, no evidence of skin tears noted. IV catheter discontinued intact. Site without signs and symptoms of complications - no redness or edema noted at insertion site, patient denies c/o pain - only slight tenderness at site.  Dressing with slight pressure applied.  D/c  Instructions-Education: Discharge instructions given to patient/family with verbalized understanding. D/c education completed with patient/family including follow up instructions, medication list, d/c activities limitations if indicated, with other d/c instructions as indicated by MD - patient able to verbalize understanding, all questions fully answered. Patient instructed to return to ED, call 911, or call MD for any changes in condition.  Patient escorted via [place], and D/C home via private auto.  Kathie Rhodes, RN 12/02/2021 1:49 PM Nsg Discharge Note  Admit Date:  11/28/2021 Discharge date: 12/02/2021   [facility] to be D/C'd Skilled nursing facility per MD order.  AVS completed.  Copy for chart, and copy for patient signed, and dated. Patient/caregiver able to verbalize understanding.  Discharge Medication: Allergies as of 12/02/2021       Reactions   Niacin Itching, Rash   Burning sensation   Contrast Media [iodinated Contrast Media] Nausea And Vomiting   Iodine-131 Nausea And Vomiting   Nsaids Other (See Comments)   Taking Coumadin        Medication List     TAKE these medications    acetaminophen 325 MG tablet Commonly known as: TYLENOL Take 2 tablets (650 mg total) by mouth every 6 (six) hours as needed for mild pain, fever or headache (or Fever >/= 101).   albuterol 108 (90 Base) MCG/ACT inhaler Commonly known as: VENTOLIN HFA Inhale 2 puffs into the lungs every 6 (six) hours as needed for wheezing or shortness of breath.   amLODipine 10 MG tablet Commonly known as: NORVASC Take 10 mg by mouth daily.   amoxicillin-clavulanate 875-125 MG tablet Commonly known as: AUGMENTIN Take 1 tablet by mouth every 12 (twelve) hours for 7 days.   aspirin EC 81 MG tablet Take 1 tablet (81 mg total) by mouth daily with breakfast.   atorvastatin 40 MG tablet Commonly known as: LIPITOR Take 1 tablet (40 mg total) by mouth daily.   famotidine 20 MG tablet Commonly known  as: Pepcid One after supper What changed:  how much to take how to take this when to take this   Gemtesa 75 MG Tabs Generic drug: Vibegron Take 75 mg by mouth daily.   HYDROcodone-acetaminophen 5-325 MG tablet Commonly known as: NORCO/VICODIN Take 1 tablet by mouth daily as needed for moderate pain.   ipratropium 0.03 % nasal spray Commonly known as: ATROVENT Place 2 sprays into both nostrils as needed for rhinitis.   ipratropium-albuterol 0.5-2.5 (3) MG/3ML Soln Commonly known as: DUONEB Take 3 mLs by nebulization every 4 (four) hours as needed.   isosorbide mononitrate 60 MG 24 hr tablet Commonly known as: IMDUR Take 2 tablets (120 mg total) by mouth every evening.   losartan 25 MG tablet Commonly known as: COZAAR Take 1 tablet (25 mg total) by  mouth daily.   metFORMIN 1000 MG tablet Commonly known as: GLUCOPHAGE Take 500 mg by mouth 2 (two) times daily with a meal.   metoprolol succinate 25 MG 24 hr tablet Commonly known as: TOPROL-XL Take 1 tablet (25 mg total) by mouth daily. Pt. Needs to make an appt. With Cardiologist in order to receive further refills. Thank You. 1st Attempt.   nitroGLYCERIN 0.4 MG SL tablet Commonly known as: NITROSTAT Place 1 tablet (0.4 mg total) under the tongue every 5 (five) minutes x 3 doses as needed for chest pain.   OXYGEN Inhale 3 L into the lungs at bedtime.   pantoprazole 40 MG tablet Commonly known as: PROTONIX Take 1 tablet (40 mg total) by mouth daily. Take 30-60 min before first meal of the day   potassium chloride 10 MEQ tablet Commonly known as: KLOR-CON M Take 1 tablet (10 mEq total) by mouth daily. Only take while taking Torsemide   tamsulosin 0.4 MG Caps capsule Commonly known as: FLOMAX Take 1 capsule (0.4 mg total) by mouth daily after supper.   torsemide 10 MG tablet Commonly known as: DEMADEX Take 1 tablet (10 mg total) by mouth every other day. Take 20 mg for weight gain over 3 lbs in 24 hours    warfarin 1 MG tablet Commonly known as: COUMADIN Take 2 mg by mouth every evening.        Discharge Assessment: Vitals:   12/01/21 2052 12/02/21 0401  BP: (!) 106/49 136/65  Pulse: (!) 53 62  Resp: 17 16  Temp: 97.8 F (36.6 C) 98.7 F (37.1 C)  SpO2: 92% 95%   Skin clean, dry and intact without evidence of skin break down, no evidence of skin tears noted. IV catheter discontinued intact. Site without signs and symptoms of complications - no redness or edema noted at insertion site, patient denies c/o pain - only slight tenderness at site.  Dressing with slight pressure applied.  D/c Instructions-Education: Discharge instructions given to patient/family with verbalized understanding. D/c education completed with patient/family including follow up instructions, medication list, d/c activities limitations if indicated, with other d/c instructions as indicated by MD - patient able to verbalize understanding, all questions fully answered. Patient instructed to return to ED, call 911, or call MD for any changes in condition.  Patient escorted via [place], and D/C home via private auto.  Kathie Rhodes, RN 12/02/2021 1:49 PM

## 2021-12-02 NOTE — Progress Notes (Signed)
[facility] for Warfarin Indication: atrial fibrillation  Allergies  Allergen Reactions   Niacin Itching and Rash    Burning sensation   Contrast Media [Iodinated Contrast Media] Nausea And Vomiting   Iodine-131 Nausea And Vomiting   Nsaids Other (See Comments)    Taking Coumadin    Patient Measurements: Height: '5\' 10"'$  (177.8 cm) Weight: 89.9 kg (198 lb 3.1 oz) IBW/kg (Calculated) : 73  Vital Signs: Temp: 98.7 F (37.1 C) (09/02 0401) Temp Source: Oral (09/02 0401) BP: 136/65 (09/02 0401) Pulse Rate: 62 (09/02 0401)  Labs: Recent Labs    11/30/21 0409 11/30/21 2316 12/01/21 0459 12/01/21 0635 12/02/21 0522  HGB 9.8* 10.1*  --  9.9*  --   HCT 31.3* 32.1*  --  30.8*  --   PLT 163  --   --  149*  --   LABPROT 30.1*  --  30.3*  --  28.8*  INR 2.9*  --  2.9*  --  2.8*  CREATININE 1.07  --   --   --  0.90     Estimated Creatinine Clearance: 76.4 mL/min (by C-G formula based on SCr of 0.9 mg/dL).   Medical History: Past Medical History:  Diagnosis Date   AAA (abdominal aortic aneurysm) ([place])    Followed by Dr. Sherren Mocha Early   Arthritis    CAD (coronary artery disease)    a. CABG 2000.   Cancer [facility])    Prostate:  Radiation Tx   Carotid artery disease ([place])    Chest pain    precordial. mild chronic .[name]... nonischemic   CHF (congestive heart failure) ([facility])    Chronic edema    Coronary artery disease    a. Nuclear, January, 2008, no ischemia b. Cath 08/2012- 1/4 patent grafts, RCA CTO, no flow-limiting disease, medically managed   Diabetes mellitus without complication ([place])    Dizziness 02/2011   Dyslipidemia    Fall    GERD (gastroesophageal reflux disease)    TAKES TUMS & ROLAIDS AS NEEDED   Hx of CABG 2000   Hypertension    Myocardial infarction ([place]) 1999   Neck pain 02/2011   Pneumonia    Pulmonary hypertension ([place])    Renal artery stenosis ([facility])    50-70%   S/P femoropopliteal bypass surgery    Dr. Donnetta Hutching    Sinus bradycardia    Asymptomatic    Medications:  See meds  Assessment: 81 year old male with a history of coronary artery disease, permanent atrial fibrillation on warfarin, HFpEF, severe pulmonary hypertension, diabetes mellitus type 2, renal artery stenosis, hyperlipidemia, hypertension, esophageal dysphagia, peripheral vascular disease, chronic respiratory failure on nighttime oxygen presenting with 1 day history of left-sided testicular pain and edema. Pharmacy asked to manage warfarin.  Home dose is '2mg'$  daily per last office visit 11/24/21   INR 3.1 > 3.0> 2.9> 2.9> 2.8 , therapeutic  Goal of Therapy:  INR 2-3 Monitor platelets by anticoagulation protocol: Yes   Plan:  Warfarin 2 mg x 1 dose Daily PT-INR Monitor CBC and for bleeding  Margot Ables, PharmD Clinical Pharmacist 12/02/2021 8:28 AM

## 2021-12-02 NOTE — Discharge Summary (Signed)
Physician Discharge Summary  Troy [name] BDZ:[id number] DOB: 1941-02-21 DOA: 11/28/2021  PCP: Sharilyn Sites, MD  Admit date: 11/28/2021 Discharge date: 12/02/2021  Admitted From: Home Disposition: Skilled nursing facility  Recommendations for Outpatient Follow-up:  Follow up with PCP in 1-2 weeks Please obtain BMP/CBC in one week Follow-up with urology in 1 week Consider follow-up with GI for elective endoscopy Follow-up with vascular surgery.  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  The patient was recently discharged from the hospital after a stay from 11/06/2021 to 11/09/2021 for acute on chronic diastolic CHF.  He was discharged home on his previous dose of torsemide 10 mg every 48 hours.  His discharge weight was 198.2 pounds.  The patient followed up in the urology clinic 11/13/2021.  For his chronic urinary frequency.  The patient was started on cephalexin which she states he has finished.  He is not exactly sure which that he finished.  Nevertheless, the patient had been doing fairly well until he noticed increasing pain and edema on the evening of 11/27/2021 in his left testicle.  He states that it progressed to the point where he was having difficulty standing up and walking.  He lives by himself.  He denies any recent injury or trauma.  He has not had fevers, chills, nausea, vomiting or diarrhea abdominal pain.  He states that lower extremity edema is about  the same as usual.  He denies any chest pain or shortness of breath, coughing, hemoptysis. In the ED, the patient was afebrile and hemodynamically stable with oxygen saturation 97% on room air.  WBC 12.0, hemoglobin 11.5, platelets 194,000.  Sodium 138, potassium 3.6, bicarbonate 24, BUN 8, serum creatinine 0.77.  Ultrasound of the scrotum was negative for torsion but showed asymmetric enlargement of the left testicle and left epididymitis.  The patient was given a dose of levofloxacin  Discharge Diagnoses:  Principal Problem:   Epididymoorchitis Active Problems:   --Severe pulmonary HTN, 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 ----Pulmonary HTN    Hyperlipidemia   Renal artery stenosis (HCC)   Peripheral vascular disease, unspecified (HCC)   Atrial fibrillation (HCC)   Acquired thrombophilia (HCC)   Chronic heart failure with preserved ejection fraction (HFpEF) (HCC)  Acute epididymoorchitis -The patient has had numerous recent antibiotic exposure predisposing him to MDR organism -Start Zosyn -Trying to avoid quinolones at this time given drug interaction with warfarin -Urine culture positive for E. 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  - Dr. Aundra Dubin outpatient follow up after discharge    Permanent Atrial Fibrillation - HR well controlled, pt is anticoagulated with warfarin -Pharmacy consult for warfarin management in hospital -Following daily PT/INR   Hyperlipidemia - resumed home statin   Type 2 diabetes mellitus, controlled with vascular complications  -SSI coverage and  frequent CBG monitoring in addition to prandial NovoLog ordered -hold metformin - 08/29/21 A1C--6.5   Esophageal dysphagia  -Patient had a barium  swallow evaluation at [facility] on 8/8.  -SLP saw pt last admission -SLP recommends regular diet with thin liquids -Will need patient GI follow-up to consider endoscopy for esophageal dilatation   CAD s/p CABG at age 39 - he is being medically managed now and we have resumed his cardiac medications - Cath in 1/22 showed patent LIMA-LAD and SVG-D but occluded SVG-OM and SVG-RCA -continue imdur and metoprolol succinate and statin   chronic respiratory failure with hypoxia -on 3L at night at home   Essential hypertension  - resuming home medication and follow closely    PVD - severe  - he has had multiple bypass surgeries - resume medical management  - Occluded right fem-pop bypass.  No claudication - continue ASA   Constipation -He did not have any improvement after Dulcolax suppository -Constipation resolved after enema   Generalized weakness -Skilled nursing facility placement   Discharge Instructions  Discharge Instructions     Diet - low sodium heart healthy   Complete by: As directed    Increase activity slowly   Complete by: As directed       Allergies as of 12/02/2021       Reactions   Niacin Itching, Rash   Burning sensation   Contrast Media [iodinated Contrast Media] Nausea And Vomiting   Iodine-131 Nausea And Vomiting   Nsaids Other (See Comments)   Taking Coumadin        Medication List     TAKE these medications    acetaminophen 325 MG tablet Commonly known as: TYLENOL Take 2 tablets (650 mg total) by mouth every 6 (six) hours as needed for mild pain, fever or headache (or Fever >/= 101).   albuterol 108 (90 Base) MCG/ACT inhaler Commonly known as: VENTOLIN HFA Inhale 2 puffs into the lungs every 6 (six) hours as needed for wheezing or shortness of breath.   amLODipine 10 MG tablet Commonly known as: NORVASC Take 10 mg by mouth daily.   amoxicillin-clavulanate 875-125 MG tablet Commonly known as: AUGMENTIN Take 1 tablet by mouth every  12 (twelve) hours for 7 days.   aspirin EC 81 MG tablet Take 1 tablet (81 mg total) by mouth daily with breakfast.   atorvastatin 40 MG tablet Commonly known as: LIPITOR Take 1 tablet (40 mg total) by mouth daily.   famotidine 20 MG tablet Commonly known as: Pepcid One after supper What changed:  how much to take how to take this when to take this   Gemtesa 75 MG Tabs Generic drug: Vibegron Take 75 mg by mouth daily.   HYDROcodone-acetaminophen 5-325 MG tablet Commonly known as: NORCO/VICODIN Take 1 tablet by mouth daily as needed for moderate pain.   ipratropium 0.03 % nasal spray Commonly known as: ATROVENT Place 2 sprays into both nostrils as needed for rhinitis.   ipratropium-albuterol 0.5-2.5 (3) MG/3ML Soln Commonly known as: DUONEB Take 3 mLs by nebulization every 4 (four) hours as needed.   isosorbide mononitrate 60 MG 24 hr tablet Commonly known as: IMDUR Take 2 tablets (120 mg total) by mouth every evening.   losartan 25 MG tablet Commonly known as: COZAAR Take 1 tablet (25 mg total) by mouth daily.   metFORMIN 1000 MG tablet Commonly known as: GLUCOPHAGE Take 500 mg by mouth 2 (two) times daily with a meal.   metoprolol succinate 25 MG 24  hr tablet Commonly known as: TOPROL-XL Take 1 tablet (25 mg total) by mouth daily. Pt. Needs to make an appt. With Cardiologist in order to receive further refills. Thank You. 1st Attempt.   nitroGLYCERIN 0.4 MG SL tablet Commonly known as: NITROSTAT Place 1 tablet (0.4 mg total) under the tongue every 5 (five) minutes x 3 doses as needed for chest pain.   OXYGEN Inhale 3 L into the lungs at bedtime.   pantoprazole 40 MG tablet Commonly known as: PROTONIX Take 1 tablet (40 mg total) by mouth daily. Take 30-60 min before first meal of the day   potassium chloride 10 MEQ tablet Commonly known as: KLOR-CON M Take 1 tablet (10 mEq total) by mouth daily. Only take while taking Torsemide   tamsulosin 0.4 MG Caps  capsule Commonly known as: FLOMAX Take 1 capsule (0.4 mg total) by mouth daily after supper.   torsemide 10 MG tablet Commonly known as: DEMADEX Take 1 tablet (10 mg total) by mouth every other day. Take 20 mg for weight gain over 3 lbs in 24 hours   warfarin 1 MG tablet Commonly known as: COUMADIN Take 2 mg by mouth every evening.        Contact information for after-discharge care     [facility] .   Service: Skilled Nursing Contact information: [address] [address] [phone]                    Allergies  Allergen Reactions   Niacin Itching and Rash    Burning sensation   Contrast Media [Iodinated Contrast Media] Nausea And Vomiting   Iodine-131 Nausea And Vomiting   Nsaids Other (See Comments)    Taking Coumadin    Consultations:    Procedures/Studies: US SCROTUM W/DOPPLER  Result Date: 11/28/2021 CLINICAL DATA:  Left testicle pain and swelling since yesterday. Difficulty urinating. History of prostate cancer, status post TURP EXAM: SCROTAL ULTRASOUND DOPPLER ULTRASOUND OF THE TESTICLES TECHNIQUE: Complete ultrasound examination of the testicles, epididymis, and other scrotal structures was performed. Color and spectral Doppler ultrasound were also utilized to evaluate blood flow to the testicles. COMPARISON:  None Available. FINDINGS: Right testicle Measurements: 3.9 x 2.5 x 2.5 cm (volume = 13 cm^3). No mass or microlithiasis visualized. Left testicle Measurements: 4.3 x 2.4 x 3.4 cm (volume = 18 cm^3). Surrounding soft tissues appear edematous. Focal area of Ectasias of the rete testes noted. Anechoic, well-circumscribed cyst with increased through transmission is identified measuring 0.5 x 0.4 x 0.6 cm. No mass or microlithiasis visualized. Right epididymis:  Normal in size and appearance. Left epididymis: Appears enlarged with increased blood flow. Cyst measures  7 x 5 x 9 mm. Hydrocele:  Small bilateral hydroceles. Varicocele:  None visualized. Pulsed Doppler interrogation of both testes demonstrates normal low resistance arterial and venous waveforms bilaterally. Asymmetric increased blood flow to the left testis noted. IMPRESSION: 1. No evidence for testicular mass or torsion. 2. Asymmetric enlargement of the left testicle and left epididymis with increased blood flow concerning for left-sided orchitis and epididymitis. 3. Small bilateral hydroceles. Electronically Signed   By: Kerby Moors M.D.   On: 11/28/2021 06:34   DG Carlena Hurl SPEECH PATH  Result Date: 11/07/2021 Table formatting from the original result was not included. Objective Swallowing Evaluation: Type of Study: Bedside Swallow Evaluation  Patient Details Name: JONTAVIOUS COMMONS MRN: [id number] Date of Birth: [birth year] Today's Date:  11/07/2021 Time: SLP Start Time (ACUTE ONLY): 1030 -SLP Stop Time (ACUTE ONLY): 1057 SLP Time Calculation (min) (ACUTE ONLY): 27 min HPI: ISIDORE MARGRAF is a 81 year old gentleman with a complex past medical history including coronary artery disease, heart failure preserved EF, severe pulmonary hypertension, type 2 diabetes mellitus, renal artery stenosis, hyperlipidemia, essential hypertension, anticoagulated with warfarin, esophageal dysphagia, peripheral vascular disease, atrial fibrillation, nocturnal hypoxemia on night oxygen who was just recently discharged from the [place] [facility] in [place] after being hospitalized for dizziness and dehydration. Pt with hx of dysphonia, RVF immobility, oropharyngeal dysphagia, esophageal dysphagia and UES dysfunction. Pt has been treated at [facility] by GI and SLP teams there. Most Recent MBSS was completed 12/2019 with recommendation for regular/thin and R head turn to facilitate clearance of pharyngeal residue.   Past Medical History: Past Medical History: Diagnosis Date  AAA (abdominal aortic aneurysm) ([place])   Followed by Dr.  Sherren Mocha Early  Arthritis   CAD (coronary artery disease)   a. CABG 2000.  Cancer [facility])   Prostate:  Radiation Tx  Carotid artery disease ([place])   Chest pain   precordial. mild chronic .[name]... nonischemic  CHF (congestive heart failure) (HCC)   Chronic edema   Coronary artery disease   a. Nuclear, January, 2008, no ischemia b. Cath 08/2012- 1/4 patent grafts, RCA CTO, no flow-limiting disease, medically managed  Diabetes mellitus without complication ([place])   Dizziness 02/2011  Dyslipidemia   Fall   GERD (gastroesophageal reflux disease)   TAKES TUMS & ROLAIDS AS NEEDED  Hx of CABG 2000  Hypertension   Myocardial infarction ([place]) 1999  Neck pain 02/2011  Pneumonia   Pulmonary hypertension ([place])   Renal artery stenosis (HCC)   50-70%  S/P femoropopliteal bypass surgery   Dr. Donnetta Hutching  Sinus bradycardia   Asymptomatic Past Surgical History: Past Surgical History: Procedure Laterality Date  CARDIAC CATHETERIZATION  09/26/2012  1/4 patent bypass (occluded SVG-PDA, SVG-OM, LIMA-LAD), SVG-diagonal patent and fills the diagonal and LAD, distal RCA occlusion with left to right collateralization, patent circumflex, LAD with no flow-limiting disease and antegrade flow competitively from SVG-diagonal; EF 60-65%  COLONOSCOPY  11/30/2009  HDQ:[id number]. next TCS 11/2019  COLONOSCOPY WITH PROPOFOL N/A 12/18/2019  Procedure: COLONOSCOPY WITH PROPOFOL;  Surgeon: Harvel Quale, MD;  [facility];  Service: Gastroenterology;  Laterality: N/A;  1030  CORONARY ARTERY BYPASS GRAFT  2000  CYSTOSCOPY N/A 08/31/2021  Procedure: CYSTOSCOPY;  Surgeon: Cleon Gustin, MD;  Location: [facility] ORS;  Service: Urology;  Laterality: N/A;  pt knows to arrive at 7:00  ESOPHAGOGASTRODUODENOSCOPY N/A 08/12/2013  DEY:[id number] peptic stricture with erosive refluxesophagitis - status post Maloney dilation. Hiatal hernia. Abnormalgastric mucosa. Deformity of the pyloric channel suggestive ofprior peptic ulcer disease. Duodenal bulbar  diverticulum Statuspost gastric biopsy. h.pylori  LEFT HEART CATHETERIZATION WITH CORONARY/GRAFT ANGIOGRAM N/A 09/26/2012  Procedure: LEFT HEART CATHETERIZATION WITH Beatrix Fetters;  Surgeon: Burnell Blanks, MD;  Location: [facility] CATH LAB;  Service: Cardiovascular;  Laterality: N/A;  MALONEY DILATION N/A 08/12/2013  Procedure: Venia Minks DILATION;  Surgeon: Daneil Dolin, MD;  Location: [facility];  Service: Endoscopy;  Laterality: N/A;  POLYPECTOMY  12/18/2019  Procedure: POLYPECTOMY;  Surgeon: Harvel Quale, MD;  Location[facility];  Service: Gastroenterology;;  ascending colon polyp   PR VEIN BYPASS GRAFT,AORTO-FEM-POP Right 07/19/1999  PR VEIN BYPASS GRAFT,AORTO-FEM-POP Left 05/02/2006  RIGHT HEART CATH AND CORONARY/GRAFT ANGIOGRAPHY N/A 04/11/2020  Procedure: RIGHT HEART CATH AND CORONARY/GRAFT ANGIOGRAPHY;  Surgeon: Aundra Dubin,  Elby Showers, MD;  Location: [facility] CV LAB;  Service: Cardiovascular;  Laterality: N/A;  SAVORY DILATION N/A 08/12/2013  Procedure: SAVORY DILATION;  Surgeon: Daneil Dolin, MD;  [facility];  Service: Endoscopy;  Laterality: N/A;  TRANSURETHRAL RESECTION OF BLADDER TUMOR N/A 08/31/2021  Procedure: TRANSURETHRAL RESECTION OF BLADDER TUMOR (TURBT);  Surgeon: Cleon Gustin, MD;  Location: [facility];  Service: Urology;  Laterality: N/A;  WRIST SURGERY    cyst removal No data recorded No data recorded  Recommendations for follow up therapy are one component of a multi-disciplinary discharge planning process, led by the attending physician.  Recommendations may be updated based on patient status, additional functional criteria and insurance authorization. Assessment / Plan / Recommendation   11/07/2021  11:00 AM Clinical Impressions Clinical Impression Patient presents with stable, moderate sensorimotor oropharyngeal dysphagia. Compared to previous MBSS completed 04/21/19 (reviewed in care everywhere), PT's presentation is largely unchanged. Continue to note  reduced bolus propulsion and UES distention resulting in moderate-severe, diffuse post-swallow bolus residual in the pharynx. Continue to noted that head turn to the right with slight chin tuck is most effective in improving pharyngeal clearance. Deficits also result in laryngeal penetration of thin liquid after the swallow. Trace aspiraiton of thin liquid X1 with initial tsp sip of thin liquids. Stasis of barium tablet was observed at the level of the UES which required multiple liquid washes. Pt does report sensate to varying amounts of pharyngeal residue. Based on today's evaluation, recommend patient continue regular solid diet with thin liquids and strict adherence to aspiration precautions and swallowing strategies below. Continue to recommend GI consult and possible need for repeat esophageal dilation and/or treatment for esophageal dysphagia at physician discretion as indicated. Education: Pt was educated at to results and recommendations; he was educated as to risks and adverse outcomes associated with dysphagia and aspiration (i.e. possible life threatening PNA). Reviewed rationale for adherence to recommendations. Pt verbalized understanding. SLP Visit Diagnosis Dysphagia, oropharyngeal phase (R13.12) Impact on safety and function Moderate aspiration risk     11/07/2021  11:00 AM Treatment Recommendations Treatment Recommendations Therapy as outlined in treatment plan below     11/07/2021  11:00 AM Prognosis Prognosis for Safe Diet [facility]   11/07/2021  11:00 AM Diet Recommendations SLP Diet Recommendations Regular solids;Thin liquid Liquid Administration via Cup;Straw Medication Administration Whole meds with liquid Compensations Minimize environmental distractions;Slow rate;Multiple dry swallows after each bite/sip;Follow solids with liquid;Chin tuck Postural Changes Seated upright at 90 degrees;Remain semi-upright after after feeds/meals (Comment)     11/07/2021  11:00 AM Other Recommendations  Recommended Consults Consider GI evaluation;Consider esophageal assessment Oral Care Recommendations Oral care BID Follow Up Recommendations Outpatient SLP   11/07/2021  11:00 AM Frequency and Duration  Speech Therapy Frequency (ACUTE ONLY) min 2x/week Treatment Duration 1 week     11/07/2021  11:00 AM Oral Phase Oral Phase [facility]    11/07/2021  11:00 AM Pharyngeal Phase Pharyngeal Phase Impaired Pharyngeal- Thin Teaspoon Reduced epiglottic inversion;Reduced anterior laryngeal mobility;Reduced laryngeal elevation;Reduced airway/laryngeal closure;Reduced tongue base retraction;Penetration/Aspiration before swallow;Trace aspiration;Pharyngeal residue - valleculae;Pharyngeal residue - pyriform Pharyngeal Material enters airway, passes BELOW cords without attempt by patient to eject out (silent aspiration) Pharyngeal- Thin Cup Reduced epiglottic inversion;Reduced anterior laryngeal mobility;Reduced laryngeal elevation;Reduced airway/laryngeal closure;Reduced tongue base retraction;Penetration/Aspiration before swallow;Pharyngeal residue - valleculae;Pharyngeal residue - pyriform;Penetration/Aspiration during swallow Pharyngeal Material enters airway, remains ABOVE vocal cords then ejected out Pharyngeal- Thin Straw Reduced epiglottic inversion;Reduced anterior laryngeal mobility;Reduced laryngeal elevation;Reduced airway/laryngeal closure;Reduced tongue  base retraction;Penetration/Aspiration before swallow;Pharyngeal residue - valleculae;Pharyngeal residue - pyriform;Penetration/Aspiration during swallow Pharyngeal Material enters airway, remains ABOVE vocal cords then ejected out Pharyngeal- Puree Reduced epiglottic inversion;Reduced anterior laryngeal mobility;Reduced laryngeal elevation;Reduced airway/laryngeal closure;Reduced tongue base retraction;Penetration/Aspiration before swallow;Pharyngeal residue - valleculae;Pharyngeal residue - pyriform;Compensatory strategies attempted (with notebox);Inter-arytenoid space  residue;Pharyngeal residue - posterior pharnyx;Pharyngeal residue - cp segment Pharyngeal- Mechanical Soft NT Pharyngeal- Regular Reduced epiglottic inversion;Reduced anterior laryngeal mobility;Reduced laryngeal elevation;Reduced airway/laryngeal closure;Reduced tongue base retraction;Penetration/Aspiration before swallow;Pharyngeal residue - valleculae;Pharyngeal residue - pyriform;Compensatory strategies attempted (with notebox);Inter-arytenoid space residue;Pharyngeal residue - posterior pharnyx;Pharyngeal residue - cp segment Pharyngeal Material enters airway, remains ABOVE vocal cords then ejected out Pharyngeal- Multi-consistency NT Pharyngeal- Pill Reduced epiglottic inversion;Reduced anterior laryngeal mobility;Reduced laryngeal elevation;Reduced airway/laryngeal closure;Reduced tongue base retraction;Penetration/Aspiration before swallow;Pharyngeal residue - valleculae;Pharyngeal residue - pyriform;Compensatory strategies attempted (with notebox);Inter-arytenoid space residue;Pharyngeal residue - posterior pharnyx;Pharyngeal residue - cp segment    11/07/2021  11:00 AM Cervical Esophageal Phase  Cervical Esophageal Phase Impaired Thin Teaspoon Reduced cricopharyngeal relaxation Thin Cup Reduced cricopharyngeal relaxation Thin Straw Reduced cricopharyngeal relaxation Puree Reduced cricopharyngeal relaxation Mechanical Soft NT Regular Reduced cricopharyngeal relaxation Multi-consistency NT Pill Reduced cricopharyngeal relaxation Amelia H. Roddie Mc, CCC-SLP Speech Language Pathologist Wende Bushy 11/07/2021, 11:43 AM                     DG Chest 2 View  Result Date: 11/06/2021 CLINICAL DATA:  Shortness of breath. Cough. Hypertension. Ex-smoker. Prostate cancer. EXAM: CHEST - 2 VIEW COMPARISON:  03/02/2021 CT.  10/05/2020 plain film. FINDINGS: Numerous leads and wires project over the chest. Midline trachea. Mild cardiomegaly. Atherosclerosis in the transverse aorta. Tiny bilateral pleural effusions  are most apparent on the lateral view. No pneumothorax. Interstitial edema is mild. Patchy bibasilar airspace disease. IMPRESSION: Mild cardiomegaly with congestive heart failure and small bilateral pleural effusions. Patchy bibasilar airspace disease, favoring atelectasis. Aortic Atherosclerosis (ICD10-I70.0). Electronically Signed   By: Abigail Miyamoto M.D.   On: 11/06/2021 12:39      Subjective: He says he is feeling better today.  Testicular pain is better today.  Discharge Exam: Vitals:   12/01/21 0542 12/01/21 1419 12/01/21 2052 12/02/21 0401  BP: (!) 138/54 (!) 123/45 (!) 106/49 136/65  Pulse: (!) 45 (!) 54 (!) 53 62  Resp: '16 18 17 16  '$ Temp: 97.9 F (36.6 C) 98 F (36.7 C) 97.8 F (36.6 C) 98.7 F (37.1 C)  TempSrc: Oral Oral Oral Oral  SpO2: 95% 93% 92% 95%  Weight:      Height:        General: Pt is alert, awake, not in acute distress Cardiovascular: RRR, S1/S2 +, no rubs, no gallops Respiratory: CTA bilaterally, no wheezing, no rhonchi Abdominal: Soft, NT, ND, bowel sounds + Extremities: no edema, no cyanosis    The results of significant diagnostics from this hospitalization (including imaging, microbiology, ancillary and laboratory) are listed below for reference.     Microbiology: Recent Results (from the past 240 hour(s))  Urine Culture     Status: Abnormal   Collection Time: 11/28/21  3:58 AM   Specimen: Urine, Clean Catch  Result Value Ref Range Status   Specimen Description   Final    URINE, CLEAN CATCH Performed at [facility], [address] [place], [place] [address]    Special Requests   Final    NONE Performed at [facility], [address], [place], [place] [address]    Culture >=100,000 COLONIES/mL ESCHERICHIA COLI (A)  Final  Report Status 11/30/2021 FINAL  Final   Organism ID, Bacteria ESCHERICHIA COLI (A)  Final      Susceptibility   Escherichia coli - MIC*    AMPICILLIN 4 SENSITIVE Sensitive     CEFAZOLIN <=4 SENSITIVE Sensitive      CEFEPIME <=0.12 SENSITIVE Sensitive     CEFTRIAXONE <=0.25 SENSITIVE Sensitive     CIPROFLOXACIN <=0.25 SENSITIVE Sensitive     GENTAMICIN <=1 SENSITIVE Sensitive     IMIPENEM <=0.25 SENSITIVE Sensitive     NITROFURANTOIN <=16 SENSITIVE Sensitive     TRIMETH/SULFA <=20 SENSITIVE Sensitive     AMPICILLIN/SULBACTAM <=2 SENSITIVE Sensitive     PIP/TAZO <=4 SENSITIVE Sensitive     * >=100,000 COLONIES/mL ESCHERICHIA COLI     Labs: BNP (last 3 results) Recent Labs    11/06/21 1220 11/07/21 0410 11/08/21 0541  BNP 269.0* 167.0* 19.1   Basic Metabolic Panel: Recent Labs  Lab 11/28/21 0357 11/29/21 0337 11/30/21 0409 12/02/21 0522  NA 138 136 136 138  K 3.6 3.8 3.5 3.6  CL 104 104 104 104  CO2 '24 23 26 27  '$ GLUCOSE 126* 123* 111* 109*  BUN '8 14 18 15  '$ CREATININE 0.77 0.90 1.07 0.90  CALCIUM 8.7* 8.6* 8.1* 8.5*   Liver Function Tests: No results for input(s): "AST", "ALT", "ALKPHOS", "BILITOT", "PROT", "ALBUMIN" in the last 168 hours. No results for input(s): "LIPASE", "AMYLASE" in the last 168 hours. No results for input(s): "AMMONIA" in the last 168 hours. CBC: Recent Labs  Lab 11/28/21 0357 11/29/21 0337 11/29/21 0757 11/30/21 0409 11/30/21 2316 12/01/21 0635  WBC 12.0* 13.6* 11.7* 9.9  --  6.7  NEUTROABS 9.8*  --   --   --   --   --   HGB 11.5* 11.0* 10.0* 9.8* 10.1* 9.9*  HCT 36.3* 35.6* 31.7* 31.3* 32.1* 30.8*  MCV 90.3 90.8 90.3 89.7  --  90.1  PLT 194 166 157 163  --  149*   Cardiac Enzymes: No results for input(s): "CKTOTAL", "CKMB", "CKMBINDEX", "TROPONINI" in the last 168 hours. BNP: Invalid input(s): "POCBNP" CBG: Recent Labs  Lab 11/29/21 1137 11/29/21 1621 11/29/21 2154 11/30/21 0715 11/30/21 1128  GLUCAP 156* 131* 149* 106* 114*   D-Dimer No results for input(s): "DDIMER" in the last 72 hours. Hgb A1c No results for input(s): "HGBA1C" in the last 72 hours. Lipid Profile No results for input(s): "CHOL", "HDL", "LDLCALC", "TRIG",  "CHOLHDL", "LDLDIRECT" in the last 72 hours. Thyroid function studies No results for input(s): "TSH", "T4TOTAL", "T3FREE", "THYROIDAB" in the last 72 hours.  Invalid input(s): "FREET3" Anemia work up No results for input(s): "VITAMINB12", "FOLATE", "FERRITIN", "TIBC", "IRON", "RETICCTPCT" in the last 72 hours. Urinalysis    Component Value Date/Time   COLORURINE YELLOW 11/28/2021 0358   APPEARANCEUR HAZY (A) 11/28/2021 0358   APPEARANCEUR Cloudy (A) 11/13/2021 1111   LABSPEC 1.012 11/28/2021 0358   PHURINE 7.0 11/28/2021 0358   GLUCOSEU NEGATIVE 11/28/2021 0358   HGBUR SMALL (A) 11/28/2021 0358   BILIRUBINUR NEGATIVE 11/28/2021 0358   BILIRUBINUR Negative 11/13/2021 1111   KETONESUR NEGATIVE 11/28/2021 0358   PROTEINUR 100 (A) 11/28/2021 0358   NITRITE POSITIVE (A) 11/28/2021 0358   LEUKOCYTESUR LARGE (A) 11/28/2021 0358   Sepsis Labs Recent Labs  Lab 11/29/21 0337 11/29/21 0757 11/30/21 0409 12/01/21 0635  WBC 13.6* 11.7* 9.9 6.7   Microbiology Recent Results (from the past 240 hour(s))  Urine Culture     Status: Abnormal   Collection Time: 11/28/21  3:58  AM   Specimen: Urine, Clean Catch  Result Value Ref Range Status   Specimen Description   Final    URINE, CLEAN CATCH Performed at [facility], [address] [place], [place] [address]    Special Requests   Final    NONE Performed at [facility], [address] [place], [place] [address]    Culture >=100,000 COLONIES/mL ESCHERICHIA COLI (A)  Final   Report Status 11/30/2021 FINAL  Final   Organism ID, Bacteria ESCHERICHIA COLI (A)  Final      Susceptibility   Escherichia coli - MIC*    AMPICILLIN 4 SENSITIVE Sensitive     CEFAZOLIN <=4 SENSITIVE Sensitive     CEFEPIME <=0.12 SENSITIVE Sensitive     CEFTRIAXONE <=0.25 SENSITIVE Sensitive     CIPROFLOXACIN <=0.25 SENSITIVE Sensitive     GENTAMICIN <=1 SENSITIVE Sensitive     IMIPENEM <=0.25 SENSITIVE Sensitive     NITROFURANTOIN <=16 SENSITIVE Sensitive      TRIMETH/SULFA <=20 SENSITIVE Sensitive     AMPICILLIN/SULBACTAM <=2 SENSITIVE Sensitive     PIP/TAZO <=4 SENSITIVE Sensitive     * >=100,000 COLONIES/mL ESCHERICHIA COLI     Time coordinating discharge: 65mns  SIGNED:   JKathie Dike MD  [facility] 12/02/2021, 1:08 PM   If 7PM-7AM, please contact night-coverage [email or web address]

## 2021-12-02 NOTE — TOC Progression Note (Addendum)
Transition of Care [facility]) - Progression Note    Patient Details  Name: Troy [name] MRN: [id number] Date of Birth: February 26, 1941  Transition of Care [facility]) CM/SW Contact  Joanne Chars, LCSW Phone Number: 12/02/2021, 10:35 AM  Clinical Narrative:   Pt SNF auth has not yet been approved.    1230: auth approved in [place]: [id number], [id number], 5 days: 9/2-9/6.  CSW confirmed with [facility] that they can accept pt.  MD notified.   CSW spoke with son Octavia Bruckner who is agreeable to provide transportation.    Barriers to Discharge: Continued Medical Work up  Expected Discharge Plan and Services                                                 Social Determinants of Health (SDOH) Interventions    Readmission Risk Interventions    11/07/2021   12:48 PM  Readmission Risk Prevention Plan  Transportation Screening Complete  Home Care Screening Complete  Medication Review (RN CM) Complete

## 2021-12-05 DIAGNOSIS — R5381 Other malaise: Secondary | ICD-10-CM | POA: Diagnosis not present

## 2021-12-05 DIAGNOSIS — N453 Epididymo-orchitis: Secondary | ICD-10-CM | POA: Diagnosis not present

## 2021-12-06 ENCOUNTER — Ambulatory Visit (INDEPENDENT_AMBULATORY_CARE_PROVIDER_SITE_OTHER): Payer: Medicare HMO

## 2021-12-06 ENCOUNTER — Encounter: Payer: Self-pay | Admitting: Vascular Surgery

## 2021-12-06 ENCOUNTER — Ambulatory Visit (INDEPENDENT_AMBULATORY_CARE_PROVIDER_SITE_OTHER): Payer: Medicare HMO | Admitting: Vascular Surgery

## 2021-12-06 VITALS — BP 146/63 | HR 46 | Temp 97.5°F | Ht 70.0 in | Wt 204.0 lb

## 2021-12-06 DIAGNOSIS — I7143 Infrarenal abdominal aortic aneurysm, without rupture: Secondary | ICD-10-CM

## 2021-12-06 DIAGNOSIS — I779 Disorder of arteries and arterioles, unspecified: Secondary | ICD-10-CM | POA: Diagnosis not present

## 2021-12-06 DIAGNOSIS — I4891 Unspecified atrial fibrillation: Secondary | ICD-10-CM | POA: Diagnosis not present

## 2021-12-06 NOTE — Progress Notes (Signed)
[facility]ascular and Vein Specialist of [place]  Patient name: Troy [name] MRN: [id number] DOB: 1940/12/05 Sex: male  REASON FOR VISIT: Follow-up known carotid and aortic disease.  HPI: Troy [name] is a 81 y.o. male here for follow-up of his known moderate carotid disease and also abdominal aortic aneurysm.  He has had multiple recent hospitalizations for hematuria and urinary tract infections.  He was just discharged from the hospital for GU infection.  His wife is passed away since my last visit with him 1 year ago.  He continues to live independently.  He was discharged from the hospital to rehab facility for brief convalescence.  Past Medical History:  Diagnosis Date   AAA (abdominal aortic aneurysm) ([place])    Followed by Dr. Sherren Mocha Dorthia Tout   Arthritis    CAD (coronary artery disease)    a. CABG 2000.   Cancer [facility])    Prostate:  Radiation Tx   Carotid artery disease ([place])    Chest pain    precordial. mild chronic .[name]... nonischemic   CHF (congestive heart failure) (HCC)    Chronic edema    Coronary artery disease    a. Nuclear, January, 2008, no ischemia b. Cath 08/2012- 1/4 patent grafts, RCA CTO, no flow-limiting disease, medically managed   Diabetes mellitus without complication (HCC)    Dizziness 02/2011   Dyslipidemia    Fall    GERD (gastroesophageal reflux disease)    TAKES TUMS & ROLAIDS AS NEEDED   Hx of CABG 2000   Hypertension    Myocardial infarction ([place]) 1999   Neck pain 02/2011   Pneumonia    Pulmonary hypertension ([place])    Renal artery stenosis (HCC)    50-70%   S/P femoropopliteal bypass surgery    Dr. Donnetta Hutching   Sinus bradycardia    Asymptomatic    Family History  Problem Relation Age of Onset   Deep vein thrombosis Father    Lung cancer Sister    Diabetes Sister    Heart disease Sister        After age 47   Hyperlipidemia Sister    Hypertension Sister    Lung cancer Sister    Breast cancer Sister     Hypertension Mother    Diabetes Sister    Coronary artery disease Other        family hx of   Cancer Brother        "crab cancer"   Heart disease Brother    Heart attack Brother    Heart attack Daughter    Colon cancer Neg Hx    Stroke Neg Hx     SOCIAL HISTORY: Social History   Tobacco Use   Smoking status: Former    Packs/day: 2.00    Years: 70.00    Total pack years: 140.00    Types: Cigarettes    Quit date: 04/21/2018    Years since quitting: 3.6   Smokeless tobacco: Never  Substance Use Topics   Alcohol use: Yes    Alcohol/week: 0.0 - 2.0 standard drinks of alcohol    Comment: OCCASIONAL    Allergies  Allergen Reactions   Niacin Itching and Rash    Burning sensation   Contrast Media [Iodinated Contrast Media] Nausea And Vomiting   Iodine-131 Nausea And Vomiting   Nsaids Other (See Comments)    Taking Coumadin    Current Outpatient Medications  Medication Sig Dispense Refill   acetaminophen (TYLENOL) 325 MG tablet Take 2 tablets (650  mg total) by mouth every 6 (six) hours as needed for mild pain, fever or headache (or Fever >/= 101). 12 tablet 0   albuterol (VENTOLIN HFA) 108 (90 Base) MCG/ACT inhaler Inhale 2 puffs into the lungs every 6 (six) hours as needed for wheezing or shortness of breath. 1 each 2   amLODipine (NORVASC) 10 MG tablet Take 10 mg by mouth daily.     amoxicillin-clavulanate (AUGMENTIN) 875-125 MG tablet Take 1 tablet by mouth every 12 (twelve) hours for 7 days. 14 tablet 0   aspirin 81 MG EC tablet Take 1 tablet (81 mg total) by mouth daily with breakfast. 30 tablet 3   atorvastatin (LIPITOR) 40 MG tablet Take 1 tablet (40 mg total) by mouth daily. 90 tablet 1   famotidine (PEPCID) 20 MG tablet One after supper (Patient taking differently: Take 20 mg by mouth every evening. One after supper) 30 tablet 11   HYDROcodone-acetaminophen (NORCO/VICODIN) 5-325 MG tablet Take 1 tablet by mouth daily as needed for moderate pain. 30 tablet 0    ipratropium (ATROVENT) 0.03 % nasal spray Place 2 sprays into both nostrils as needed for rhinitis.     ipratropium-albuterol (DUONEB) 0.5-2.5 (3) MG/3ML SOLN Take 3 mLs by nebulization every 4 (four) hours as needed. 360 mL 1   isosorbide mononitrate (IMDUR) 60 MG 24 hr tablet Take 2 tablets (120 mg total) by mouth every evening. 90 tablet 6   losartan (COZAAR) 25 MG tablet Take 1 tablet (25 mg total) by mouth daily. 30 tablet 4   metFORMIN (GLUCOPHAGE) 1000 MG tablet Take 500 mg by mouth 2 (two) times daily with a meal.     metoprolol succinate (TOPROL-XL) 25 MG 24 hr tablet Take 1 tablet (25 mg total) by mouth daily. Pt. Needs to make an appt. With Cardiologist in order to receive further refills. Thank You. 1st Attempt. 30 tablet 0   nitroGLYCERIN (NITROSTAT) 0.4 MG SL tablet Place 1 tablet (0.4 mg total) under the tongue every 5 (five) minutes x 3 doses as needed for chest pain. 25 tablet 3   OXYGEN Inhale 3 L into the lungs at bedtime.     pantoprazole (PROTONIX) 40 MG tablet Take 1 tablet (40 mg total) by mouth daily. Take 30-60 min before first meal of the day     potassium chloride (KLOR-CON) 10 MEQ tablet Take 1 tablet (10 mEq total) by mouth daily. Only take while taking Torsemide 30 tablet 3   tamsulosin (FLOMAX) 0.4 MG CAPS capsule Take 1 capsule (0.4 mg total) by mouth daily after supper. 30 capsule 11   torsemide (DEMADEX) 10 MG tablet Take 1 tablet (10 mg total) by mouth every other day. Take 20 mg for weight gain over 3 lbs in 24 hours 30 tablet 3   Vibegron (GEMTESA) 75 MG TABS Take 75 mg by mouth daily. 28 tablet 0   warfarin (COUMADIN) 1 MG tablet Take 2 mg by mouth every evening.     No current facility-administered medications for this visit.    REVIEW OF SYSTEMS:  '[X]'$  denotes positive finding, '[ ]'$  denotes negative finding Cardiac  Comments:  Chest pain or chest pressure:    Shortness of breath upon exertion:    Short of breath when lying flat:    Irregular heart rhythm:         Vascular    Pain in calf, thigh, or hip brought on by ambulation:    Pain in feet at night that wakes you up from  your sleep:     Blood clot in your veins:    Leg swelling:           PHYSICAL EXAM: Vitals:   12/06/21 1532 12/06/21 1533  BP: (!) 148/64 (!) 146/63  Pulse: (!) 46   Temp: (!) 97.5 F (36.4 C)   SpO2: 96%   Weight: 204 lb (92.5 kg)   Height: '5\' 10"'$  (1.778 m)     GENERAL: The patient is a well-nourished male, in no acute distress. The vital signs are documented above. CARDIOVASCULAR: Carotid arteries without bruits bilaterally.  No palpable abdominal aortic aneurysm PULMONARY: There is good air exchange  MUSCULOSKELETAL: There are no major deformities or cyanosis. NEUROLOGIC: No focal weakness or paresthesias are detected. SKIN: There are no ulcers or rashes noted. PSYCHIATRIC: The patient has a normal affect.  DATA:  Carotid duplex today reveals moderate 40 to 59% left carotid stenosis and no evidence of significant stenosis in the right  He did undergo CT scan of his abdomen and pelvis for work-up of hematuria i on 08/18/2021.  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  He also had a saccular component above the bifurcation extending to the left.  The diameter of the saccular component was approximately 3-1/2 cm.  He did not have any other CT for evaluation.  He did have an MRI of his spine in 2013.  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  MEDICAL ISSUES: Stable overall.  No significant carotid disease bilaterally.  He has stable diameter of his infrarenal aorta and I discussed the finding of this pseudoaneurysm with him as well.  I would recommend that we see him again in 6 months with a formal CT angiogram for further evaluation.    Rosetta Posner, MD FACS [facility] [phone]  Note: Portions of this report  may have been transcribed using voice recognition software.  Every effort has been made to ensure accuracy; however, inadvertent computerized transcription errors may still be present.

## 2021-12-07 NOTE — Patient Outreach (Signed)
RITTER HELSLEY 1941-01-23 [id number]   [place] Organization [ACO] Patient: [employer]  Primary Care Provider:  Sharilyn Sites, MD with [facility]  *Referral request [place] Readmission Report  Electronic Medical Record was reviewed for readmission and disposition.  Report reviewed from DC Summary, Inpatient TOC [Transition of [place] team notes.  Plan: Information obtained for readmission report and disposition  Natividad Brood, RN BSN [facility] Liaison  [phone] business mobile phone Toll free office [phone]  Fax number: [phone] [email or web address]

## 2021-12-08 DIAGNOSIS — I25810 Atherosclerosis of coronary artery bypass graft(s) without angina pectoris: Secondary | ICD-10-CM | POA: Diagnosis not present

## 2021-12-08 DIAGNOSIS — I5032 Chronic diastolic (congestive) heart failure: Secondary | ICD-10-CM | POA: Diagnosis not present

## 2021-12-08 DIAGNOSIS — I739 Peripheral vascular disease, unspecified: Secondary | ICD-10-CM | POA: Diagnosis not present

## 2021-12-08 DIAGNOSIS — N453 Epididymo-orchitis: Secondary | ICD-10-CM | POA: Diagnosis not present

## 2021-12-08 DIAGNOSIS — E785 Hyperlipidemia, unspecified: Secondary | ICD-10-CM | POA: Diagnosis not present

## 2021-12-08 DIAGNOSIS — N451 Epididymitis: Secondary | ICD-10-CM | POA: Diagnosis not present

## 2021-12-08 DIAGNOSIS — R5381 Other malaise: Secondary | ICD-10-CM | POA: Diagnosis not present

## 2021-12-08 DIAGNOSIS — E119 Type 2 diabetes mellitus without complications: Secondary | ICD-10-CM | POA: Diagnosis not present

## 2021-12-08 DIAGNOSIS — R52 Pain, unspecified: Secondary | ICD-10-CM | POA: Diagnosis not present

## 2021-12-13 DIAGNOSIS — N451 Epididymitis: Secondary | ICD-10-CM | POA: Diagnosis not present

## 2021-12-13 DIAGNOSIS — E785 Hyperlipidemia, unspecified: Secondary | ICD-10-CM | POA: Diagnosis not present

## 2021-12-13 DIAGNOSIS — E119 Type 2 diabetes mellitus without complications: Secondary | ICD-10-CM | POA: Diagnosis not present

## 2021-12-13 DIAGNOSIS — N453 Epididymo-orchitis: Secondary | ICD-10-CM | POA: Diagnosis not present

## 2021-12-13 DIAGNOSIS — I10 Essential (primary) hypertension: Secondary | ICD-10-CM | POA: Diagnosis not present

## 2021-12-13 DIAGNOSIS — I25810 Atherosclerosis of coronary artery bypass graft(s) without angina pectoris: Secondary | ICD-10-CM | POA: Diagnosis not present

## 2021-12-14 DIAGNOSIS — I25810 Atherosclerosis of coronary artery bypass graft(s) without angina pectoris: Secondary | ICD-10-CM | POA: Diagnosis not present

## 2021-12-14 DIAGNOSIS — N453 Epididymo-orchitis: Secondary | ICD-10-CM | POA: Diagnosis not present

## 2021-12-14 DIAGNOSIS — R5381 Other malaise: Secondary | ICD-10-CM | POA: Diagnosis not present

## 2021-12-14 DIAGNOSIS — I739 Peripheral vascular disease, unspecified: Secondary | ICD-10-CM | POA: Diagnosis not present

## 2021-12-14 DIAGNOSIS — I10 Essential (primary) hypertension: Secondary | ICD-10-CM | POA: Diagnosis not present

## 2021-12-14 DIAGNOSIS — E785 Hyperlipidemia, unspecified: Secondary | ICD-10-CM | POA: Diagnosis not present

## 2021-12-14 DIAGNOSIS — I5032 Chronic diastolic (congestive) heart failure: Secondary | ICD-10-CM | POA: Diagnosis not present

## 2021-12-15 ENCOUNTER — Ambulatory Visit (HOSPITAL_COMMUNITY)
Admission: RE | Admit: 2021-12-15 | Discharge: 2021-12-15 | Disposition: A | Discharge status: Home / Self Care | Payer: Medicare HMO | Source: Ambulatory Visit | Attending: Cardiology | Admitting: Cardiology

## 2021-12-15 ENCOUNTER — Encounter (HOSPITAL_COMMUNITY): Payer: Self-pay | Admitting: Cardiology

## 2021-12-15 VITALS — BP 150/70 | HR 58 | Wt 200.4 lb

## 2021-12-15 DIAGNOSIS — J3801 Paralysis of vocal cords and larynx, unilateral: Secondary | ICD-10-CM | POA: Diagnosis not present

## 2021-12-15 DIAGNOSIS — I25119 Atherosclerotic heart disease of native coronary artery with unspecified angina pectoris: Secondary | ICD-10-CM | POA: Diagnosis not present

## 2021-12-15 DIAGNOSIS — I25810 Atherosclerosis of coronary artery bypass graft(s) without angina pectoris: Secondary | ICD-10-CM | POA: Diagnosis not present

## 2021-12-15 DIAGNOSIS — I6529 Occlusion and stenosis of unspecified carotid artery: Secondary | ICD-10-CM | POA: Diagnosis not present

## 2021-12-15 DIAGNOSIS — I272 Pulmonary hypertension, unspecified: Secondary | ICD-10-CM | POA: Diagnosis not present

## 2021-12-15 DIAGNOSIS — Z7984 Long term (current) use of oral hypoglycemic drugs: Secondary | ICD-10-CM | POA: Diagnosis not present

## 2021-12-15 DIAGNOSIS — I4510 Unspecified right bundle-branch block: Secondary | ICD-10-CM | POA: Insufficient documentation

## 2021-12-15 DIAGNOSIS — Z7901 Long term (current) use of anticoagulants: Secondary | ICD-10-CM | POA: Insufficient documentation

## 2021-12-15 DIAGNOSIS — I2510 Atherosclerotic heart disease of native coronary artery without angina pectoris: Secondary | ICD-10-CM

## 2021-12-15 DIAGNOSIS — I4821 Permanent atrial fibrillation: Secondary | ICD-10-CM | POA: Diagnosis not present

## 2021-12-15 DIAGNOSIS — I5032 Chronic diastolic (congestive) heart failure: Secondary | ICD-10-CM | POA: Diagnosis not present

## 2021-12-15 DIAGNOSIS — Z8249 Family history of ischemic heart disease and other diseases of the circulatory system: Secondary | ICD-10-CM | POA: Diagnosis not present

## 2021-12-15 DIAGNOSIS — I110 Hypertensive heart disease with heart failure: Secondary | ICD-10-CM | POA: Diagnosis not present

## 2021-12-15 DIAGNOSIS — Z8546 Personal history of malignant neoplasm of prostate: Secondary | ICD-10-CM | POA: Diagnosis not present

## 2021-12-15 DIAGNOSIS — Z923 Personal history of irradiation: Secondary | ICD-10-CM | POA: Diagnosis not present

## 2021-12-15 DIAGNOSIS — Z95820 Peripheral vascular angioplasty status with implants and grafts: Secondary | ICD-10-CM | POA: Diagnosis not present

## 2021-12-15 DIAGNOSIS — I739 Peripheral vascular disease, unspecified: Secondary | ICD-10-CM | POA: Diagnosis not present

## 2021-12-15 DIAGNOSIS — Z79899 Other long term (current) drug therapy: Secondary | ICD-10-CM | POA: Diagnosis not present

## 2021-12-15 DIAGNOSIS — Z833 Family history of diabetes mellitus: Secondary | ICD-10-CM | POA: Insufficient documentation

## 2021-12-15 DIAGNOSIS — I714 Abdominal aortic aneurysm, without rupture, unspecified: Secondary | ICD-10-CM | POA: Insufficient documentation

## 2021-12-15 DIAGNOSIS — E119 Type 2 diabetes mellitus without complications: Secondary | ICD-10-CM | POA: Insufficient documentation

## 2021-12-15 DIAGNOSIS — J9611 Chronic respiratory failure with hypoxia: Secondary | ICD-10-CM | POA: Diagnosis not present

## 2021-12-15 DIAGNOSIS — R590 Localized enlarged lymph nodes: Secondary | ICD-10-CM | POA: Diagnosis not present

## 2021-12-15 DIAGNOSIS — Z87891 Personal history of nicotine dependence: Secondary | ICD-10-CM | POA: Diagnosis not present

## 2021-12-15 DIAGNOSIS — Z7982 Long term (current) use of aspirin: Secondary | ICD-10-CM | POA: Insufficient documentation

## 2021-12-15 DIAGNOSIS — Z9981 Dependence on supplemental oxygen: Secondary | ICD-10-CM | POA: Insufficient documentation

## 2021-12-15 DIAGNOSIS — Z950 Presence of cardiac pacemaker: Secondary | ICD-10-CM | POA: Insufficient documentation

## 2021-12-15 MED ORDER — SPIRONOLACTONE 25 MG PO TABS: 12.5000 mg | ORAL_TABLET | Freq: Every day | ORAL | 3 refills | Status: DC

## 2021-12-15 NOTE — Patient Instructions (Signed)
STOP Potassium  START Spironolactone 12.'5mg'$  (1/2 Tab) daily.  Repeat blood work in 10 days.  Your physician recommends that you schedule a follow-up appointment in: 3 months  If you have any questions or concerns before your next appointment please send [place] a message through [place] or call our office at [phone].    TO LEAVE A MESSAGE FOR THE NURSE SELECT OPTION 2, PLEASE LEAVE A MESSAGE INCLUDING: YOUR NAME DATE OF BIRTH CALL BACK NUMBER REASON FOR CALL**this is important as we prioritize the call backs  YOU WILL RECEIVE A CALL BACK THE SAME DAY AS LONG AS YOU CALL BEFORE 4:00 PM  At the [facility], you and your health needs are our priority. As part of our continuing mission to provide you with exceptional heart care, we have created designated Provider Care Teams. These Care Teams include your primary Cardiologist (physician) and Advanced Practice Providers (APPs- Physician Assistants and Nurse Practitioners) who all work together to provide you with the care you need, when you need it.   You may see any of the following providers on your designated Care Team at your next follow up: Dr Glori Bickers Dr Loralie Champagne Dr. Roxana Hires, NP Lyda Jester, [place] [facility], [place] Forestine Na, NP Audry Riles, PharmD   Please be sure to bring in all your medications bottles to every appointment.

## 2021-12-17 NOTE — Progress Notes (Signed)
PCP: Sharilyn Sites, MD HF Cardiology: Dr. Aundra Dubin  81 y.o. with history of permanent atrial fibrillation, CAD s/p CABG 2000, PAD, chronic diastolic CHF was referred to CHF clinic by Melina Copa, PA, for evaluation of CHF and pulmonary hypertension.  Patient had CABG in 2000, last cath was in 2014 showing occluded SVG-OM, occluded SVG-PDA, occluded RCA, and atretic LIMA.  SVG-D was patent and native LAD was patent.  Patient had bilateral fem-pop bypasses, peripheral dopplers in 7/21 showed that the right fem-pop was occluded.  He is in permanent atrial fibrillation on warfarin. Echo in 10/21 showed normal LV systolic function with moderately dilated/mildly dysfunctional RV and PASP 75 mmHg.  In 12/21, he was admitted with PNA and treated with antibiotics.  CTA chest showed no PE, RML PNA, and bilateral hilar + mediastinal lymphadenopathy.  He was sent home from this admission on home oxygen.   In 1/22, RHC/LHC was done.  This showed patent LIMA-LAD and SVG-D, occluded SVG-OM and occluded SVG-RCA, 95% calcified proximal RCA stenosis, totally occluded PLV with collaterals (known from prior).  There was moderate pulmonary hypertension with optimized left and right heart filling pressures.  I reviewed the cath with interventional cardiology, would require atherectomy to treat the RCA.  We decided to manage medically initially.   He stopped Jardiance due to penile yeast infection.   Follow up 1/23. Torsemide 10 mg daily resumed, echo arranged to follow RV.   Echo 4/23 showed EF 55-60%, mild LVH, RV mildly reduced.  Today he returns for HF follow up. He has had recent hospitalizations with UTIs and hematuria.  He sees urology. He was in rehab for a time period but is now home.  He uses a walker for stability.  No exertional dyspnea or chest pain. No lightheadedness.  BP elevated today but has not taken his meds yet.  Weight down 4 lbs.    Labs (12/21): K 3.8, creatinine 0.83, hgb 9.5 Labs (1/22): K 3.8,  creatinine 1.22, LDL 49 Labs (2/22): K 3.6, creatinine 0.86 Labs (10/22): K 3.7, creatinine 0.75, BNP 153 Labs (1/23): LDL 62, HDL 37 Labs (9/23): K 3.6, creatinine 0.9, hgb 11.4  ECG (personally reviewed): Atrial fibrillation with iRBBB  PMH: 1. Atrial fibrillation: Permanent.  2. CAD: CABG 2000.  - LHC (2014) with totally occluded RCA, totally occluded SVG-RCA, totally occluded SVG-OM, atretic LIMA, patent SVG-D.   - LHC (1/22): patent LIMA-LAD and SVG-D, occluded SVG-OM and occluded SVG-RCA, 60-70% calcified ostial RCA stenosis and 95% calcified proximal RCA stenosis, totally occluded PLV with collaterals (known from prior). 3. HTN 4. PAD: s/p bilateral fem-pop bypasses.   - Peripheral arterial dopplers (7/21): Occluded right fem-pop, patent left fem-pop.  5. Type 2 diabetes 6. Prostate cancer: Treated with radiation. 7. PVCs 8. History of renal artery stenosis.  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  10. Chronic diastolic CHF: Echo (51/02) with EF 60-65%, mildly decreased RV systolic function with moderate RV enlargement, severe biatrial enlargement, PASP 75 mmHg, dilated IVC.  V/Q scan 7/22 with no chronic PE.  - RHC (1/22): mean RA 6, PA 65/18 mean 35, mean PCWP 14, CI 2.71, PVR 3.7 WU.  - Echo (4/23): EF 55-60%, mild LVH, RV mildly reduced, severely elevated pulmonary artery systolic pressure 11. 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  - Abdominal US (8/22): 4.8 cm AAA - CT abd/pelvis (5/23): 4.8 cm AAA 12. Carotid stenosis: Carotid dopplers (5/85) with 27-78% LICA stenosis.  - Carotid dopplers (2/42): 35-36% LICA  stenosis.  71. BPH  SH: Lives in [place] with wife.  Retired.  Remote smoker (>20 years ago). No ETOH.   Family History  Problem Relation Age of Onset   Deep vein thrombosis Father    Lung cancer Sister    Diabetes Sister    Heart disease Sister        After age 43   Hyperlipidemia Sister    Hypertension Sister    Lung cancer Sister    Breast  cancer Sister    Hypertension Mother    Diabetes Sister    Coronary artery disease Other        family hx of   Cancer Brother        "crab cancer"   Heart disease Brother    Heart attack Brother    Heart attack Daughter    Colon cancer Neg Hx    Stroke Neg Hx    ROS: All systems reviewed and negative except as per HPI.   Current Outpatient Medications  Medication Sig Dispense Refill   acetaminophen (TYLENOL) 325 MG tablet Take 2 tablets (650 mg total) by mouth every 6 (six) hours as needed for mild pain, fever or headache (or Fever >/= 101). 12 tablet 0   albuterol (VENTOLIN HFA) 108 (90 Base) MCG/ACT inhaler Inhale 2 puffs into the lungs every 6 (six) hours as needed for wheezing or shortness of breath. 1 each 2   amLODipine (NORVASC) 10 MG tablet Take 10 mg by mouth daily.     aspirin 81 MG EC tablet Take 1 tablet (81 mg total) by mouth daily with breakfast. 30 tablet 3   atorvastatin (LIPITOR) 40 MG tablet Take 1 tablet (40 mg total) by mouth daily. 90 tablet 1   famotidine (PEPCID) 20 MG tablet One after supper 30 tablet 11   HYDROcodone-acetaminophen (NORCO/VICODIN) 5-325 MG tablet Take 1 tablet by mouth daily as needed for moderate pain. 30 tablet 0   ipratropium (ATROVENT) 0.03 % nasal spray Place 2 sprays into both nostrils as needed for rhinitis.     ipratropium-albuterol (DUONEB) 0.5-2.5 (3) MG/3ML SOLN Take 3 mLs by nebulization every 4 (four) hours as needed. 360 mL 1   isosorbide mononitrate (IMDUR) 60 MG 24 hr tablet Take 2 tablets (120 mg total) by mouth every evening. 90 tablet 6   losartan (COZAAR) 25 MG tablet Take 1 tablet (25 mg total) by mouth daily. 30 tablet 4   metFORMIN (GLUCOPHAGE) 1000 MG tablet Take 500 mg by mouth 2 (two) times daily with a meal.     metoprolol succinate (TOPROL-XL) 25 MG 24 hr tablet Take 1 tablet (25 mg total) by mouth daily. Pt. Needs to make an appt. With Cardiologist in order to receive further refills. Thank You. 1st Attempt. 30 tablet  0   nitroGLYCERIN (NITROSTAT) 0.4 MG SL tablet Place 1 tablet (0.4 mg total) under the tongue every 5 (five) minutes x 3 doses as needed for chest pain. 25 tablet 3   OXYGEN Inhale 3 L into the lungs at bedtime.     pantoprazole (PROTONIX) 40 MG tablet Take 1 tablet (40 mg total) by mouth daily. Take 30-60 min before first meal of the day     spironolactone (ALDACTONE) 25 MG tablet Take 0.5 tablets (12.5 mg total) by mouth daily. 45 tablet 3   tamsulosin (FLOMAX) 0.4 MG CAPS capsule Take 1 capsule (0.4 mg total) by mouth daily after supper. 30 capsule 11   torsemide (DEMADEX) 10 MG tablet Take  1 tablet (10 mg total) by mouth every other day. Take 20 mg for weight gain over 3 lbs in 24 hours 30 tablet 3   warfarin (COUMADIN) 1 MG tablet Take 2 mg by mouth every evening.     No current facility-administered medications for this encounter.   Wt Readings from Last 3 Encounters:  12/15/21 90.9 kg (200 lb 6.4 oz)  12/06/21 92.5 kg (204 lb)  11/28/21 89.9 kg (198 lb 3.1 oz)   BP (!) 150/70   Pulse (!) 58   Wt 90.9 kg (200 lb 6.4 oz)   SpO2 95%   BMI 28.75 kg/m  General: NAD Neck: JVP 7-8 cm, no thyromegaly or thyroid nodule.  Lungs: Clear to auscultation bilaterally with normal respiratory effort. CV: Nondisplaced PMI.  Heart regular S1/S2, no S3/S4, 1/6 SEM RUSB.  No peripheral edema.  No carotid bruit.  Normal pedal pulses.  Abdomen: Soft, nontender, no hepatosplenomegaly, no distention.  Skin: Intact without lesions or rashes.  Neurologic: Alert and oriented x 3.  Psych: Normal affect. Extremities: No clubbing or cyanosis.  HEENT: Normal.   Assessment/Plan: 1. Chronic diastolic CHF: Echo in 85/02 with EF 60-65%, mildly decreased RV systolic function with moderate RV enlargement, severe biatrial enlargement, PASP 75 mmHg, dilated IVC. After increasing diuretics, RHC in 1/22 showed normal filling pressures with moderate pulmonary hypertension. Echo 4/23 with EF 60-65%, mildly enlarged RV  with mildly decreased RV function. NYHA class II. He is not volume overloaded on exam.  - Continue torsemide 10 mg qod.  BMET today.  - Will add spironolactone 12.5 mg daily and stop KCl. BMET in 10 days.  - Continue losartan 25 mg daily. - No Jardiance due to yeast infection.  2. 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  Moderate PH on RHC in 1/22.  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  Less likely group 1 PH.  No chronic PEs on 7/22 V/Q scan.  3. Chronic hypoxemic respiratory failure: Remote smoker.  Hilar and mediastinal lymphadenopathy on recent CT, no reported ILD.  Recurrent aspiration PNA in setting of chronic right vocal cord paralysis.  Was on home oxygen but able to stop after more effective diuresis, now just uses oxygen at night.  - Should follow with pulmonary.  4. CAD: s/p CABG in 2000.  Cath in 1/22 showed patent LIMA-LAD and SVG-D but occluded SVG-OM and SVG-RCA.  There was 60-70% ostial RCA stenosis and 95% proximal RCA stenosis with heavy calcification.  Discussed with interventional, with normal EF plan was to proceed with initial medical management and proceed to PCI if chest pain worsened (PCI would require atherectomy). No chest pain recently.     - Continue ASA 81 and atorvastatin, good lipids 1/23. - Continue Imdur 60 mg bid and Toprol XL 25 mg daily for angina control.  5. Atrial fibrillation: Permanent.  - On warfarin.  - Continue Toprol XL 25 mg daily.  6. PAD: Occluded right fem-pop bypass.  No claudication.  - Follows with [facility]. 7. Carotid stenosis: 77-41% LICA stenosis on 2/87 dopplers, followed at [facility].  8. AAA: 4.8 cm AAA on 5/23 abdominal CT.  - Followed by [facility].    Follow up in 3 months with APP.   Troy [name]  12/17/2021

## 2021-12-19 ENCOUNTER — Telehealth (HOSPITAL_COMMUNITY): Payer: Self-pay

## 2021-12-19 ENCOUNTER — Telehealth: Payer: Self-pay

## 2021-12-19 DIAGNOSIS — I5032 Chronic diastolic (congestive) heart failure: Secondary | ICD-10-CM

## 2021-12-19 MED ORDER — SPIRONOLACTONE 25 MG PO TABS: 12.5000 mg | ORAL_TABLET | Freq: Every day | ORAL | 3 refills | Status: DC

## 2021-12-19 NOTE — Telephone Encounter (Signed)
Called patient to confirm he wanted his rx sent to [facility], he states he was recently discharged from the hospital after having an infection.  He wanted to confirm that he needed to keep his upcoming apt for Cysto.  Patient only needs a call back if this apt needs to be cancelled, otherwise he will keep 10/04 apt.

## 2021-12-19 NOTE — Telephone Encounter (Signed)
Patient's Troy [name] rx has been electronically sent.

## 2021-12-19 NOTE — Telephone Encounter (Signed)
Patient will keep upcoming apt.

## 2021-12-21 ENCOUNTER — Other Ambulatory Visit: Payer: Self-pay

## 2021-12-21 DIAGNOSIS — R310 Gross hematuria: Secondary | ICD-10-CM

## 2021-12-21 MED ORDER — TAMSULOSIN HCL 0.4 MG PO CAPS: 0.4000 mg | ORAL_CAPSULE | Freq: Every day | ORAL | 3 refills | Status: DC

## 2021-12-26 DIAGNOSIS — E1122 Type 2 diabetes mellitus with diabetic chronic kidney disease: Secondary | ICD-10-CM | POA: Diagnosis not present

## 2021-12-26 DIAGNOSIS — I4891 Unspecified atrial fibrillation: Secondary | ICD-10-CM | POA: Diagnosis not present

## 2021-12-26 DIAGNOSIS — Z0001 Encounter for general adult medical examination with abnormal findings: Secondary | ICD-10-CM | POA: Diagnosis not present

## 2021-12-26 DIAGNOSIS — Z6830 Body mass index (BMI) 30.0-30.9, adult: Secondary | ICD-10-CM | POA: Diagnosis not present

## 2021-12-26 DIAGNOSIS — I700 Atherosclerosis of aorta: Secondary | ICD-10-CM | POA: Diagnosis not present

## 2021-12-26 DIAGNOSIS — Z79899 Other long term (current) drug therapy: Secondary | ICD-10-CM | POA: Diagnosis not present

## 2021-12-26 DIAGNOSIS — E118 Type 2 diabetes mellitus with unspecified complications: Secondary | ICD-10-CM | POA: Diagnosis not present

## 2021-12-26 DIAGNOSIS — I129 Hypertensive chronic kidney disease with stage 1 through stage 4 chronic kidney disease, or unspecified chronic kidney disease: Secondary | ICD-10-CM | POA: Diagnosis not present

## 2021-12-26 DIAGNOSIS — I509 Heart failure, unspecified: Secondary | ICD-10-CM | POA: Diagnosis not present

## 2021-12-26 DIAGNOSIS — I2510 Atherosclerotic heart disease of native coronary artery without angina pectoris: Secondary | ICD-10-CM | POA: Diagnosis not present

## 2021-12-26 DIAGNOSIS — I10 Essential (primary) hypertension: Secondary | ICD-10-CM | POA: Diagnosis not present

## 2021-12-26 DIAGNOSIS — C61 Malignant neoplasm of prostate: Secondary | ICD-10-CM | POA: Diagnosis not present

## 2022-01-02 ENCOUNTER — Encounter (HOSPITAL_COMMUNITY): Payer: Self-pay | Admitting: Emergency Medicine

## 2022-01-02 ENCOUNTER — Other Ambulatory Visit: Payer: Self-pay

## 2022-01-02 ENCOUNTER — Inpatient Hospital Stay (HOSPITAL_COMMUNITY)
Admission: EM | Admit: 2022-01-02 | Discharge: 2022-01-04 | DRG: 699 | Disposition: A | Discharge status: Home / Self Care | Payer: No Typology Code available for payment source | Attending: Internal Medicine | Admitting: Internal Medicine

## 2022-01-02 DIAGNOSIS — G4734 Idiopathic sleep related nonobstructive alveolar hypoventilation: Secondary | ICD-10-CM

## 2022-01-02 DIAGNOSIS — R310 Gross hematuria: Secondary | ICD-10-CM | POA: Diagnosis not present

## 2022-01-02 DIAGNOSIS — Z8249 Family history of ischemic heart disease and other diseases of the circulatory system: Secondary | ICD-10-CM

## 2022-01-02 DIAGNOSIS — Z7901 Long term (current) use of anticoagulants: Secondary | ICD-10-CM

## 2022-01-02 DIAGNOSIS — Z951 Presence of aortocoronary bypass graft: Secondary | ICD-10-CM

## 2022-01-02 DIAGNOSIS — Y832 Surgical operation with anastomosis, bypass or graft as the cause of abnormal reaction of the patient, or of later complication, without mention of misadventure at the time of the procedure: Secondary | ICD-10-CM | POA: Diagnosis present

## 2022-01-02 DIAGNOSIS — Z86010 Personal history of colonic polyps: Secondary | ICD-10-CM

## 2022-01-02 DIAGNOSIS — I10 Essential (primary) hypertension: Secondary | ICD-10-CM | POA: Diagnosis present

## 2022-01-02 DIAGNOSIS — I272 Pulmonary hypertension, unspecified: Secondary | ICD-10-CM | POA: Diagnosis present

## 2022-01-02 DIAGNOSIS — N139 Obstructive and reflux uropathy, unspecified: Principal | ICD-10-CM | POA: Diagnosis present

## 2022-01-02 DIAGNOSIS — I714 Abdominal aortic aneurysm, without rupture, unspecified: Secondary | ICD-10-CM | POA: Diagnosis present

## 2022-01-02 DIAGNOSIS — I4891 Unspecified atrial fibrillation: Secondary | ICD-10-CM | POA: Diagnosis present

## 2022-01-02 DIAGNOSIS — I739 Peripheral vascular disease, unspecified: Secondary | ICD-10-CM | POA: Diagnosis present

## 2022-01-02 DIAGNOSIS — R0609 Other forms of dyspnea: Secondary | ICD-10-CM | POA: Diagnosis present

## 2022-01-02 DIAGNOSIS — Z888 Allergy status to other drugs, medicaments and biological substances status: Secondary | ICD-10-CM

## 2022-01-02 DIAGNOSIS — I25810 Atherosclerosis of coronary artery bypass graft(s) without angina pectoris: Secondary | ICD-10-CM | POA: Diagnosis present

## 2022-01-02 DIAGNOSIS — T82898A Other specified complication of vascular prosthetic devices, implants and grafts, initial encounter: Secondary | ICD-10-CM | POA: Diagnosis present

## 2022-01-02 DIAGNOSIS — N3041 Irradiation cystitis with hematuria: Secondary | ICD-10-CM | POA: Diagnosis present

## 2022-01-02 DIAGNOSIS — R1319 Other dysphagia: Secondary | ICD-10-CM | POA: Diagnosis present

## 2022-01-02 DIAGNOSIS — Z8546 Personal history of malignant neoplasm of prostate: Secondary | ICD-10-CM

## 2022-01-02 DIAGNOSIS — N50812 Left testicular pain: Secondary | ICD-10-CM | POA: Diagnosis present

## 2022-01-02 DIAGNOSIS — I252 Old myocardial infarction: Secondary | ICD-10-CM

## 2022-01-02 DIAGNOSIS — R339 Retention of urine, unspecified: Principal | ICD-10-CM

## 2022-01-02 DIAGNOSIS — Z91041 Radiographic dye allergy status: Secondary | ICD-10-CM

## 2022-01-02 DIAGNOSIS — D6869 Other thrombophilia: Secondary | ICD-10-CM

## 2022-01-02 DIAGNOSIS — Z886 Allergy status to analgesic agent status: Secondary | ICD-10-CM

## 2022-01-02 DIAGNOSIS — R319 Hematuria, unspecified: Secondary | ICD-10-CM | POA: Diagnosis present

## 2022-01-02 DIAGNOSIS — I4821 Permanent atrial fibrillation: Secondary | ICD-10-CM | POA: Diagnosis present

## 2022-01-02 DIAGNOSIS — Z79899 Other long term (current) drug therapy: Secondary | ICD-10-CM

## 2022-01-02 DIAGNOSIS — I2510 Atherosclerotic heart disease of native coronary artery without angina pectoris: Secondary | ICD-10-CM | POA: Diagnosis present

## 2022-01-02 DIAGNOSIS — I5032 Chronic diastolic (congestive) heart failure: Secondary | ICD-10-CM | POA: Diagnosis present

## 2022-01-02 DIAGNOSIS — D6859 Other primary thrombophilia: Secondary | ICD-10-CM | POA: Diagnosis present

## 2022-01-02 DIAGNOSIS — I779 Disorder of arteries and arterioles, unspecified: Secondary | ICD-10-CM | POA: Diagnosis present

## 2022-01-02 DIAGNOSIS — E1151 Type 2 diabetes mellitus with diabetic peripheral angiopathy without gangrene: Secondary | ICD-10-CM | POA: Diagnosis present

## 2022-01-02 DIAGNOSIS — Z87891 Personal history of nicotine dependence: Secondary | ICD-10-CM

## 2022-01-02 DIAGNOSIS — N3289 Other specified disorders of bladder: Secondary | ICD-10-CM | POA: Diagnosis present

## 2022-01-02 DIAGNOSIS — I110 Hypertensive heart disease with heart failure: Secondary | ICD-10-CM | POA: Diagnosis present

## 2022-01-02 DIAGNOSIS — R1314 Dysphagia, pharyngoesophageal phase: Secondary | ICD-10-CM | POA: Diagnosis present

## 2022-01-02 DIAGNOSIS — Z7982 Long term (current) use of aspirin: Secondary | ICD-10-CM

## 2022-01-02 DIAGNOSIS — Y842 Radiological procedure and radiotherapy as the cause of abnormal reaction of the patient, or of later complication, without mention of misadventure at the time of the procedure: Secondary | ICD-10-CM | POA: Diagnosis present

## 2022-01-02 DIAGNOSIS — Z95828 Presence of other vascular implants and grafts: Secondary | ICD-10-CM

## 2022-01-02 DIAGNOSIS — E119 Type 2 diabetes mellitus without complications: Secondary | ICD-10-CM

## 2022-01-02 DIAGNOSIS — Z833 Family history of diabetes mellitus: Secondary | ICD-10-CM

## 2022-01-02 DIAGNOSIS — N365 Urethral false passage: Secondary | ICD-10-CM | POA: Diagnosis present

## 2022-01-02 DIAGNOSIS — Z7984 Long term (current) use of oral hypoglycemic drugs: Secondary | ICD-10-CM

## 2022-01-02 DIAGNOSIS — J9611 Chronic respiratory failure with hypoxia: Secondary | ICD-10-CM | POA: Diagnosis present

## 2022-01-02 DIAGNOSIS — I70301 Unspecified atherosclerosis of unspecified type of bypass graft(s) of the extremities, right leg: Secondary | ICD-10-CM | POA: Diagnosis present

## 2022-01-02 DIAGNOSIS — E785 Hyperlipidemia, unspecified: Secondary | ICD-10-CM | POA: Diagnosis present

## 2022-01-02 DIAGNOSIS — I4819 Other persistent atrial fibrillation: Secondary | ICD-10-CM

## 2022-01-02 DIAGNOSIS — Z9981 Dependence on supplemental oxygen: Secondary | ICD-10-CM

## 2022-01-02 DIAGNOSIS — I701 Atherosclerosis of renal artery: Secondary | ICD-10-CM | POA: Diagnosis present

## 2022-01-02 DIAGNOSIS — K219 Gastro-esophageal reflux disease without esophagitis: Secondary | ICD-10-CM | POA: Diagnosis present

## 2022-01-02 LAB — CBC WITH DIFFERENTIAL/PLATELET
Abs Immature Granulocytes: 0.02 10*3/uL (ref 0.00–0.07)
Basophils Absolute: 0 10*3/uL (ref 0.0–0.1)
Basophils Relative: 1 %
Eosinophils Absolute: 0.2 10*3/uL (ref 0.0–0.5)
Eosinophils Relative: 3 %
HCT: 38.4 % — ABNORMAL LOW (ref 39.0–52.0)
Hemoglobin: 12.3 g/dL — ABNORMAL LOW (ref 13.0–17.0)
Immature Granulocytes: 0 %
Lymphocytes Relative: 20 %
Lymphs Abs: 1.2 10*3/uL (ref 0.7–4.0)
MCH: 28.3 pg (ref 26.0–34.0)
MCHC: 32 g/dL (ref 30.0–36.0)
MCV: 88.3 fL (ref 80.0–100.0)
Monocytes Absolute: 0.5 10*3/uL (ref 0.1–1.0)
Monocytes Relative: 8 %
Neutro Abs: 4.2 10*3/uL (ref 1.7–7.7)
Neutrophils Relative %: 68 %
Platelets: 158 10*3/uL (ref 150–400)
RBC: 4.35 MIL/uL (ref 4.22–5.81)
RDW: 15.9 % — ABNORMAL HIGH (ref 11.5–15.5)
WBC: 6.2 10*3/uL (ref 4.0–10.5)
nRBC: 0 % (ref 0.0–0.2)

## 2022-01-02 LAB — BASIC METABOLIC PANEL
Anion gap: 10 (ref 5–15)
BUN: 27 mg/dL — ABNORMAL HIGH (ref 8–23)
CO2: 26 mmol/L (ref 22–32)
Calcium: 9 mg/dL (ref 8.9–10.3)
Chloride: 104 mmol/L (ref 98–111)
Creatinine, Ser: 0.98 mg/dL (ref 0.61–1.24)
GFR, Estimated: >60 mL/min (ref >60)
Glucose, Bld: 124 mg/dL — ABNORMAL HIGH (ref 70–99)
Potassium: 3.9 mmol/L (ref 3.5–5.1)
Sodium: 140 mmol/L (ref 135–145)

## 2022-01-02 LAB — URINALYSIS, ROUTINE W REFLEX MICROSCOPIC
Bacteria, UA: NONE SEEN
Bilirubin Urine: NEGATIVE
Glucose, UA: NEGATIVE mg/dL
Ketones, ur: NEGATIVE mg/dL
Leukocytes,Ua: NEGATIVE
Nitrite: NEGATIVE
Protein, ur: 30 mg/dL — AB
RBC / HPF: >50 RBC/hpf — ABNORMAL HIGH (ref 0–5)
Specific Gravity, Urine: 1.005 (ref 1.005–1.030)
pH: 8 (ref 5.0–8.0)

## 2022-01-02 LAB — HEMOGLOBIN A1C
Hgb A1c MFr Bld: 6.9 % — ABNORMAL HIGH (ref 4.8–5.6)
Mean Plasma Glucose: 151.33 mg/dL

## 2022-01-02 LAB — GLUCOSE, CAPILLARY
Glucose-Capillary: 103 mg/dL — ABNORMAL HIGH (ref 70–99)
Glucose-Capillary: 133 mg/dL — ABNORMAL HIGH (ref 70–99)

## 2022-01-02 LAB — PROTIME-INR
INR: 3.3 — ABNORMAL HIGH (ref 0.8–1.2)
Prothrombin Time: 33.3 seconds — ABNORMAL HIGH (ref 11.4–15.2)

## 2022-01-02 MED ORDER — LIDOCAINE HCL URETHRAL/MUCOSAL 2 % EX GEL
1.0000 | Freq: Once | CUTANEOUS | Status: AC
  Administered 2022-01-02: 1 via URETHRAL
  Filled 2022-01-02: qty 10

## 2022-01-02 MED ORDER — AMLODIPINE BESYLATE 5 MG PO TABS
10.0000 mg | ORAL_TABLET | Freq: Every day | ORAL | Status: DC
Start: 2022-01-02 — End: 2022-01-03
  Administered 2022-01-02: 10 mg via ORAL
  Filled 2022-01-02: qty 2

## 2022-01-02 MED ORDER — ONDANSETRON HCL 4 MG/2ML IJ SOLN: 4.0000 mg | Freq: Four times a day (QID) | INTRAMUSCULAR | Status: DC | PRN

## 2022-01-02 MED ORDER — PANTOPRAZOLE SODIUM 40 MG PO TBEC
40.0000 mg | DELAYED_RELEASE_TABLET | Freq: Every day | ORAL | Status: DC
  Administered 2022-01-02 – 2022-01-04 (×3): 40 mg via ORAL
  Filled 2022-01-02 (×3): qty 1

## 2022-01-02 MED ORDER — IPRATROPIUM-ALBUTEROL 0.5-2.5 (3) MG/3ML IN SOLN: 3.0000 mL | RESPIRATORY_TRACT | Status: DC | PRN

## 2022-01-02 MED ORDER — ONDANSETRON HCL 4 MG PO TABS: 4.0000 mg | ORAL_TABLET | Freq: Four times a day (QID) | ORAL | Status: DC | PRN

## 2022-01-02 MED ORDER — SPIRONOLACTONE 12.5 MG HALF TABLET
12.5000 mg | ORAL_TABLET | Freq: Every day | ORAL | Status: DC
  Administered 2022-01-02: 12.5 mg via ORAL
  Filled 2022-01-02: qty 1

## 2022-01-02 MED ORDER — TRAZODONE HCL 50 MG PO TABS: 50.0000 mg | ORAL_TABLET | Freq: Every evening | ORAL | Status: DC | PRN

## 2022-01-02 MED ORDER — ISOSORBIDE MONONITRATE ER 60 MG PO TB24
120.0000 mg | ORAL_TABLET | Freq: Every evening | ORAL | Status: DC
  Administered 2022-01-02 – 2022-01-03 (×2): 120 mg via ORAL
  Filled 2022-01-02 (×2): qty 2

## 2022-01-02 MED ORDER — INSULIN ASPART 100 UNIT/ML IJ SOLN: 0.0000 [IU] | Freq: Three times a day (TID) | INTRAMUSCULAR | Status: DC

## 2022-01-02 MED ORDER — TAMSULOSIN HCL 0.4 MG PO CAPS
0.4000 mg | ORAL_CAPSULE | Freq: Every day | ORAL | Status: DC
  Administered 2022-01-02 – 2022-01-03 (×2): 0.4 mg via ORAL
  Filled 2022-01-02 (×2): qty 1

## 2022-01-02 MED ORDER — FENTANYL CITRATE PF 50 MCG/ML IJ SOSY: 12.5000 ug | PREFILLED_SYRINGE | INTRAMUSCULAR | Status: DC | PRN

## 2022-01-02 MED ORDER — ALBUTEROL SULFATE (2.5 MG/3ML) 0.083% IN NEBU: 3.0000 mL | INHALATION_SOLUTION | Freq: Four times a day (QID) | RESPIRATORY_TRACT | Status: DC | PRN

## 2022-01-02 MED ORDER — HYDROMORPHONE HCL 1 MG/ML IJ SOLN
1.0000 mg | Freq: Once | INTRAMUSCULAR | Status: AC
  Administered 2022-01-02: 1 mg via INTRAMUSCULAR
  Filled 2022-01-02: qty 1

## 2022-01-02 MED ORDER — BISACODYL 5 MG PO TBEC
5.0000 mg | DELAYED_RELEASE_TABLET | Freq: Every day | ORAL | Status: DC | PRN
  Administered 2022-01-03 – 2022-01-04 (×2): 5 mg via ORAL
  Filled 2022-01-02 (×2): qty 1

## 2022-01-02 MED ORDER — LOSARTAN POTASSIUM 50 MG PO TABS
25.0000 mg | ORAL_TABLET | Freq: Every day | ORAL | Status: DC
  Administered 2022-01-02 – 2022-01-04 (×3): 25 mg via ORAL
  Filled 2022-01-02 (×3): qty 1

## 2022-01-02 MED ORDER — ACETAMINOPHEN 650 MG RE SUPP: 650.0000 mg | Freq: Four times a day (QID) | RECTAL | Status: DC | PRN

## 2022-01-02 MED ORDER — ATORVASTATIN CALCIUM 40 MG PO TABS
40.0000 mg | ORAL_TABLET | Freq: Every evening | ORAL | Status: DC
  Administered 2022-01-02 – 2022-01-03 (×2): 40 mg via ORAL
  Filled 2022-01-02 (×2): qty 1

## 2022-01-02 MED ORDER — OXYCODONE HCL 5 MG PO TABS: 5.0000 mg | ORAL_TABLET | Freq: Four times a day (QID) | ORAL | Status: DC | PRN

## 2022-01-02 MED ORDER — SODIUM CHLORIDE 0.9 % IR SOLN
3000.0000 mL | Status: DC
  Administered 2022-01-02: 3000 mL

## 2022-01-02 MED ORDER — NITROGLYCERIN 0.4 MG SL SUBL: 0.4000 mg | SUBLINGUAL_TABLET | SUBLINGUAL | Status: DC | PRN

## 2022-01-02 MED ORDER — ACETAMINOPHEN 325 MG PO TABS: 650.0000 mg | ORAL_TABLET | Freq: Four times a day (QID) | ORAL | Status: DC | PRN

## 2022-01-02 MED ORDER — METOPROLOL SUCCINATE ER 25 MG PO TB24
25.0000 mg | ORAL_TABLET | Freq: Every day | ORAL | Status: DC
  Administered 2022-01-02 – 2022-01-03 (×2): 25 mg via ORAL
  Filled 2022-01-02 (×2): qty 1

## 2022-01-02 NOTE — Assessment & Plan Note (Signed)
-   Softer diet ordered

## 2022-01-02 NOTE — ED Notes (Signed)
Meal tray given 

## 2022-01-02 NOTE — Assessment & Plan Note (Signed)
-   He is being followed outpatient by vascular surgery

## 2022-01-02 NOTE — Assessment & Plan Note (Signed)
-   Followed closely by advanced heart failure service

## 2022-01-02 NOTE — Consult Note (Signed)
Urology Consult  Referring physician: Dr. Gilford Raid Reason for referral: Gross hematuria, difficult foley  Chief Complaint: Gross hematuria  History of Present Illness: Mr Havlin is a 81yo with a history of CAD, CHF, DMII, prostate cancer treated with EBRT and hemorrhagic cystitis who presented to the ER after he developed gross hematuria and an inability to urinate this morning. He is currently on coumadin and his INR is 3.3 today.  He has a history of intermittent gross hematuria and a history of a benign bladder lesion. He was scheduled to undergo surveillance cystoscopy tomorrow in my office. On presentation to the ER he was found to be in urinary retention and multiple attempts were made to place a hematuria catheter which were unsuccessful. Bladder scan shows over 1L in his bladder. He has constant suprapubic pain and a distended abdomen.   Past Medical History:  Diagnosis Date   AAA (abdominal aortic aneurysm) ([place])    Followed by Dr. Sherren Mocha Early   Arthritis    CAD (coronary artery disease)    a. CABG 2000.   Cancer [facility] [place])    Prostate:  Radiation Tx   Carotid artery disease ([place])    Chest pain    precordial. mild chronic .[name]... nonischemic   CHF (congestive heart failure) ([facility])    Chronic edema    Coronary artery disease    a. Nuclear, January, 2008, no ischemia b. Cath 08/2012- 1/4 patent grafts, RCA CTO, no flow-limiting disease, medically managed   Diabetes mellitus without complication ([place])    Dizziness 02/2011   Dyslipidemia    Fall    GERD (gastroesophageal reflux disease)    TAKES TUMS & ROLAIDS AS NEEDED   Hx of CABG 2000   Hypertension    Myocardial infarction ([place]) 1999   Neck pain 02/2011   Pneumonia    Pulmonary hypertension ([place])    Renal artery stenosis ([facility])    50-70%   S/P femoropopliteal bypass surgery    Dr. Donnetta Hutching   Sinus bradycardia    Asymptomatic   Past Surgical History:  Procedure Laterality Date   CARDIAC CATHETERIZATION  09/26/2012   1/4 patent  bypass (occluded SVG-PDA, SVG-OM, LIMA-LAD), SVG-diagonal patent and fills the diagonal and LAD, distal RCA occlusion with left to right collateralization, patent circumflex, LAD with no flow-limiting disease and antegrade flow competitively from SVG-diagonal; EF 60-65%   COLONOSCOPY  11/30/2009   GQQ:[id number]. next TCS 11/2019   COLONOSCOPY WITH PROPOFOL N/A 12/18/2019   Procedure: COLONOSCOPY WITH PROPOFOL;  Surgeon: Harvel Quale, MD;  [facility];  Service: Gastroenterology;  Laterality: N/A;  1030   CORONARY ARTERY BYPASS GRAFT  2000   CYSTOSCOPY N/A 08/31/2021   Procedure: CYSTOSCOPY;  Surgeon: Cleon Gustin, MD;  Location: [facility];  Service: Urology;  Laterality: N/A;  pt knows to arrive at 7:00   ESOPHAGOGASTRODUODENOSCOPY N/A 08/12/2013   PYK:[id number] peptic stricture with erosive refluxesophagitis - status post Maloney dilation. Hiatal hernia. Abnormalgastric mucosa. Deformity of the pyloric channel suggestive ofprior peptic ulcer disease. Duodenal bulbar diverticulum Statuspost gastric biopsy. h.pylori   LEFT HEART CATHETERIZATION WITH CORONARY/GRAFT ANGIOGRAM N/A 09/26/2012   Procedure: LEFT HEART CATHETERIZATION WITH Beatrix Fetters;  Surgeon: Burnell Blanks, MD;  Location: [facility] CATH LAB;  Service: Cardiovascular;  Laterality: N/A;   MALONEY DILATION N/A 08/12/2013   Procedure: Venia Minks DILATION;  Surgeon: Daneil Dolin, MD;  [facility];  Service: Endoscopy;  Laterality: N/A;   POLYPECTOMY  12/18/2019   Procedure: POLYPECTOMY;  Surgeon:  Harvel Quale, MD;  [facility];  Service: Gastroenterology;;  ascending colon polyp    PR VEIN BYPASS GRAFT,AORTO-FEM-POP Right 07/19/1999   PR VEIN BYPASS GRAFT,AORTO-FEM-POP Left 05/02/2006   RIGHT HEART CATH AND CORONARY/GRAFT ANGIOGRAPHY N/A 04/11/2020   Procedure: RIGHT HEART CATH AND CORONARY/GRAFT ANGIOGRAPHY;  Surgeon: Larey Dresser, MD;  Location: [facility] CV LAB;  Service: Cardiovascular;  Laterality: N/A;   SAVORY DILATION N/A 08/12/2013   Procedure: SAVORY DILATION;  Surgeon: Daneil Dolin, MD;  [facility];  Service: Endoscopy;  Laterality: N/A;   TRANSURETHRAL RESECTION OF BLADDER TUMOR N/A 08/31/2021   Procedure: TRANSURETHRAL RESECTION OF BLADDER TUMOR (TURBT);  Surgeon: Cleon Gustin, MD;  Location: [facility];  Service: Urology;  Laterality: N/A;   WRIST SURGERY     cyst removal    Medications: I have reviewed the patient's current medications. Allergies:  Allergies  Allergen Reactions   Niacin Itching and Rash    Burning sensation   Contrast Media [Iodinated Contrast Media] Nausea And Vomiting   Iodine-131 Nausea And Vomiting   Nsaids Other (See Comments)    Taking Coumadin    Family History  Problem Relation Age of Onset   Deep vein thrombosis Father    Lung cancer Sister    Diabetes Sister    Heart disease Sister        After age 63   Hyperlipidemia Sister    Hypertension Sister    Lung cancer Sister    Breast cancer Sister    Hypertension Mother    Diabetes Sister    Coronary artery disease Other        family hx of   Cancer Brother        "crab cancer"   Heart disease Brother    Heart attack Brother    Heart attack Daughter    Colon cancer Neg Hx    Stroke Neg Hx    Social History:  reports that he quit smoking about 3 years ago. His smoking use included cigarettes. He has a 140.00 pack-year smoking history. He has never used smokeless tobacco. He reports current alcohol use. He reports that he does not use drugs.  Review of Systems  Genitourinary:  Positive for difficulty urinating and hematuria.  All other systems reviewed and are negative.   Physical Exam:  Vital signs in last 24 hours: Temp:  [97.9 F (36.6 C)-98.7 F (37.1 C)] 97.9 F (36.6 C) (10/03 2108) Pulse Rate:  [54-67] 54 (10/03 2108) Resp:  [11-20] 18 (10/03 2108) BP: (86-145)/(40-69) 98/40 (10/03 2117) SpO2:   [94 %-100 %] 94 % (10/03 2108) Weight:  [88.9 kg-90.9 kg] 88.9 kg (10/03 0850) Physical Exam Vitals reviewed.  Constitutional:      Appearance: Normal appearance.  HENT:     Head: Normocephalic and atraumatic.     Mouth/Throat:     Mouth: Mucous membranes are dry.  Eyes:     Extraocular Movements: Extraocular movements intact.     Pupils: Pupils are equal, round, and reactive to light.  Cardiovascular:     Rate and Rhythm: Normal rate and regular rhythm.  Pulmonary:     Effort: Pulmonary effort is normal. No respiratory distress.  Abdominal:     General: Abdomen is flat. There is distension.  Genitourinary:    Penis: Normal.      Testes: Normal.  Musculoskeletal:        General: No swelling. Normal range of motion.  Cervical back: Normal range of motion and neck supple.  Skin:    General: Skin is warm and dry.  Neurological:     General: No focal deficit present.     Mental Status: He is alert and oriented to person, place, and time.  Psychiatric:        Mood and Affect: Mood normal.        Behavior: Behavior normal.        Thought Content: Thought content normal.        Judgment: Judgment normal.     Laboratory Data:  Results for orders placed or performed during the hospital encounter of 01/02/22 (from the past 72 hour(s))  Urinalysis, Routine w reflex microscopic Urine, Catheterized     Status: Abnormal   Collection Time: 01/02/22  8:57 AM  Result Value Ref Range   Color, Urine RED (A) YELLOW   APPearance HAZY (A) CLEAR   Specific Gravity, Urine 1.005 1.005 - 1.030   pH 8.0 5.0 - 8.0   Glucose, UA NEGATIVE NEGATIVE mg/dL   Hgb urine dipstick MODERATE (A) NEGATIVE   Bilirubin Urine NEGATIVE NEGATIVE   Ketones, ur NEGATIVE NEGATIVE mg/dL   Protein, ur 30 (A) NEGATIVE mg/dL   Nitrite NEGATIVE NEGATIVE   Leukocytes,Ua NEGATIVE NEGATIVE   RBC / HPF >50 (H) 0 - 5 RBC/hpf   WBC, UA 0-5 0 - 5 WBC/hpf   Bacteria, UA NONE SEEN NONE SEEN   Squamous Epithelial / LPF  0-5 0 - 5    Comment: Performed at [facility], [address], [place], [place] [address]  CBC with Differential     Status: Abnormal   Collection Time: 01/02/22  9:16 AM  Result Value Ref Range   WBC 6.2 4.0 - 10.5 K/uL   RBC 4.35 4.22 - 5.81 MIL/uL   Hemoglobin 12.3 (L) 13.0 - 17.0 g/dL   HCT 38.4 (L) 39.0 - 52.0 %   MCV 88.3 80.0 - 100.0 fL   MCH 28.3 26.0 - 34.0 pg   MCHC 32.0 30.0 - 36.0 g/dL   RDW 15.9 (H) 11.5 - 15.5 %   Platelets 158 150 - 400 K/uL   nRBC 0.0 0.0 - 0.2 %   Neutrophils Relative % 68 %   Neutro Abs 4.2 1.7 - 7.7 K/uL   Lymphocytes Relative 20 %   Lymphs Abs 1.2 0.7 - 4.0 K/uL   Monocytes Relative 8 %   Monocytes Absolute 0.5 0.1 - 1.0 K/uL   Eosinophils Relative 3 %   Eosinophils Absolute 0.2 0.0 - 0.5 K/uL   Basophils Relative 1 %   Basophils Absolute 0.0 0.0 - 0.1 K/uL   Immature Granulocytes 0 %   Abs Immature Granulocytes 0.02 0.00 - 0.07 K/uL    Comment: Performed at [facility], [address] [place], [place] [address]  Basic metabolic panel     Status: Abnormal   Collection Time: 01/02/22  9:16 AM  Result Value Ref Range   Sodium 140 135 - 145 mmol/L   Potassium 3.9 3.5 - 5.1 mmol/L   Chloride 104 98 - 111 mmol/L   CO2 26 22 - 32 mmol/L   Glucose, Bld 124 (H) 70 - 99 mg/dL    Comment: Glucose reference range applies only to samples taken after fasting for at least 8 hours.   BUN 27 (H) 8 - 23 mg/dL   Creatinine, Ser 0.98 0.61 - 1.24 mg/dL   Calcium 9.0 8.9 - 10.3 mg/dL   GFR, Estimated >  60 >60 mL/min    Comment: (NOTE) Calculated using the CKD-EPI Creatinine Equation (2021)    Anion gap 10 5 - 15    Comment: Performed at [facility], [address] [place], [place] [address]  Protime-INR     Status: Abnormal   Collection Time: 01/02/22  9:16 AM  Result Value Ref Range   Prothrombin Time 33.3 (H) 11.4 - 15.2 seconds   INR 3.3 (H) 0.8 - 1.2    Comment: (NOTE) INR goal varies based on device and disease states. Performed at [facility], [address], [place], [place] [address]   Hemoglobin A1c     Status: Abnormal   Collection Time: 01/02/22  9:16 AM  Result Value Ref Range   Hgb A1c MFr Bld 6.9 (H) 4.8 - 5.6 %    Comment: (NOTE) Pre diabetes:          5.7%-6.4%  Diabetes:              >6.4%  Glycemic control for   <7.0% adults with diabetes    Mean Plasma Glucose 151.33 mg/dL    Comment: Performed at [facility] [address] [place], [place] [address]  Glucose, capillary     Status: Abnormal   Collection Time: 01/02/22  6:24 PM  Result Value Ref Range   Glucose-Capillary 103 (H) 70 - 99 mg/dL    Comment: Glucose reference range applies only to samples taken after fasting for at least 8 hours.  Glucose, capillary     Status: Abnormal   Collection Time: 01/02/22  9:08 PM  Result Value Ref Range   Glucose-Capillary 133 (H) 70 - 99 mg/dL    Comment: Glucose reference range applies only to samples taken after fasting for at least 8 hours.   No results found for this or any previous visit (from the past 240 hour(s)). Creatinine: Recent Labs    01/02/22 0916  CREATININE 0.98   Baseline Creatinine: 0.98    Cystoscopy Procedure Note  Patient identification was confirmed, informed consent was obtained, and patient was prepped using Betadine solution.  Lidocaine jelly was administered per urethral meatus.     Pre-Procedure: - Inspection reveals a normal caliber ureteral meatus.  Procedure: The flexible cystoscope was introduced without difficulty - No urethral strictures/lesions are present. False passage was encountered in the prostatic urethra - Normal prostate  - Normal bladder neck - Bilateral ureteral orifices identified - Bladder mucosa  reveals no ulcers, tumors, or lesions - No bladder stones - No trabeculation  -After entering the bladder a sensor wire was advanced throught he cystoscope and into the bladder. The cystoscope was then removed and a 20 french foley was placed  over the wire. 1200cc of dark bloody urine drained and CBI was initiated.    Post-Procedure: - Patient tolerated the procedure well    Impression/Assessment:  80yo wit gross hematuria, urinary retention  Plan:  Urinary retention: 20 french 3 way foley was placed over a wire with the aid of cystoscopy. The foley should remain in place for 7 days due to foley trauma from prior foley placement attempts Gross hematuria: The patients hematuria is likely related to a combination of an INR of 3.3 and radiation cystitis. Please continue to wean CBI to off. Urology to continue to follow  Nicolette Bang 01/02/2022, 11:22 PM

## 2022-01-02 NOTE — H&P (Signed)
History and Physical  [name]:[id number] DOB: Mar 31, 1941 DOA: 01/02/2022  PCP: Sharilyn Sites, MD  Patient coming from: Home  Level of care: Med-Surg  I have personally briefly reviewed patient's old medical records in [place]  Chief Complaint: unable to urinate   HPI: Troy [name] is a 81 year old gentleman with chronic diastolic heart failure, coronary artery disease status post CABG 2000, PAD, permanent atrial fibrillation, acquired thrombophilia anticoagulated with warfarin, history of orchitis, hyperlipidemia, hypertension, renal artery stenosis, severe pulmonary hypertension, type 2 diabetes mellitus, recurrent bouts of gross hematuria followed by urology presents to the emergency department with acute inability to urinate.  Patient reported that he started having gross hematuria at around 6 AM with blood clots and subsequently has not been able to urinate since then.  He is also complaining of lower abdominal and back pain.  He denies fever and chills.  He reports that he had been scheduled for an outpatient cystoscopy on 01/03/2022.  His discomfort has progressively worsened prompting ED visit.  The ED staff attempted to place Foley catheter but was unsuccessful.  Urologist Dr. Felipa Eth was called and he was able to place a Foley catheter with wires.  He recommended patient be observed in the hospital for starting continuous bladder irrigation and monitoring urine output.  He recommended holding warfarin for now.  Patient is being admitted into the hospital for further management.   Past Medical History:  Diagnosis Date   AAA (abdominal aortic aneurysm) ([place])    Followed by Dr. Sherren Mocha Early   Arthritis    CAD (coronary artery disease)    a. CABG 2000.   Cancer [facility])    Prostate:  Radiation Tx   Carotid artery disease ([place])    Chest pain    precordial. mild chronic .[name]... nonischemic   CHF (congestive heart failure) (HCC)    Chronic edema     Coronary artery disease    a. Nuclear, January, 2008, no ischemia b. Cath 08/2012- 1/4 patent grafts, RCA CTO, no flow-limiting disease, medically managed   Diabetes mellitus without complication ([place])    Dizziness 02/2011   Dyslipidemia    Fall    GERD (gastroesophageal reflux disease)    TAKES TUMS & ROLAIDS AS NEEDED   Hx of CABG 2000   Hypertension    Myocardial infarction ([place]) 1999   Neck pain 02/2011   Pneumonia    Pulmonary hypertension ([place])    Renal artery stenosis (HCC)    50-70%   S/P femoropopliteal bypass surgery    Dr. Donnetta Hutching   Sinus bradycardia    Asymptomatic    Past Surgical History:  Procedure Laterality Date   CARDIAC CATHETERIZATION  09/26/2012   1/4 patent bypass (occluded SVG-PDA, SVG-OM, LIMA-LAD), SVG-diagonal patent and fills the diagonal and LAD, distal RCA occlusion with left to right collateralization, patent circumflex, LAD with no flow-limiting disease and antegrade flow competitively from SVG-diagonal; EF 60-65%   COLONOSCOPY  11/30/2009   QIW:[id number]. next TCS 11/2019   COLONOSCOPY WITH PROPOFOL N/A 12/18/2019   Procedure: COLONOSCOPY WITH PROPOFOL;  Surgeon: Harvel Quale, MD;  [facility];  Service: Gastroenterology;  Laterality: N/A;  1030   CORONARY ARTERY BYPASS GRAFT  2000   CYSTOSCOPY N/A 08/31/2021   Procedure: CYSTOSCOPY;  Surgeon: Cleon Gustin, MD;  Location: [facility] ORS;  Service: Urology;  Laterality: N/A;  pt knows to arrive at 7:00   ESOPHAGOGASTRODUODENOSCOPY N/A 08/12/2013   CXK:[id number] peptic stricture  with erosive refluxesophagitis - status post Maloney dilation. Hiatal hernia. Abnormalgastric mucosa. Deformity of the pyloric channel suggestive ofprior peptic ulcer disease. Duodenal bulbar diverticulum Statuspost gastric biopsy. h.pylori   LEFT HEART CATHETERIZATION WITH CORONARY/GRAFT ANGIOGRAM N/A 09/26/2012   Procedure: LEFT HEART CATHETERIZATION WITH Beatrix Fetters;  Surgeon:  Burnell Blanks, MD;  Location: [facility] CATH LAB;  Service: Cardiovascular;  Laterality: N/A;   MALONEY DILATION N/A 08/12/2013   Procedure: Venia Minks DILATION;  Surgeon: Daneil Dolin, MD;  Location: [facility];  Service: Endoscopy;  Laterality: N/A;   POLYPECTOMY  12/18/2019   Procedure: POLYPECTOMY;  Surgeon: Harvel Quale, MD;  Location: [facility];  Service: Gastroenterology;;  ascending colon polyp    PR VEIN BYPASS GRAFT,AORTO-FEM-POP Right 07/19/1999   PR VEIN BYPASS GRAFT,AORTO-FEM-POP Left 05/02/2006   RIGHT HEART CATH AND CORONARY/GRAFT ANGIOGRAPHY N/A 04/11/2020   Procedure: RIGHT HEART CATH AND CORONARY/GRAFT ANGIOGRAPHY;  Surgeon: Larey Dresser, MD;  Location: [place] CV LAB;  Service: Cardiovascular;  Laterality: N/A;   SAVORY DILATION N/A 08/12/2013   Procedure: SAVORY DILATION;  Surgeon: Daneil Dolin, MD;  [facility];  Service: Endoscopy;  Laterality: N/A;   TRANSURETHRAL RESECTION OF BLADDER TUMOR N/A 08/31/2021   Procedure: TRANSURETHRAL RESECTION OF BLADDER TUMOR (TURBT);  Surgeon: Cleon Gustin, MD;  Location: [facility];  Service: Urology;  Laterality: N/A;   WRIST SURGERY     cyst removal     reports that he quit smoking about 3 years ago. His smoking use included cigarettes. He has a 140.00 pack-year smoking history. He has never used smokeless tobacco. He reports current alcohol use. He reports that he does not use drugs.  Allergies  Allergen Reactions   Niacin Itching and Rash    Burning sensation   Contrast Media [Iodinated Contrast Media] Nausea And Vomiting   Iodine-131 Nausea And Vomiting   Nsaids Other (See Comments)    Taking Coumadin    Family History  Problem Relation Age of Onset   Deep vein thrombosis Father    Lung cancer Sister    Diabetes Sister    Heart disease Sister        After age 70   Hyperlipidemia Sister    Hypertension Sister    Lung cancer Sister    Breast cancer Sister    Hypertension Mother     Diabetes Sister    Coronary artery disease Other        family hx of   Cancer Brother        "crab cancer"   Heart disease Brother    Heart attack Brother    Heart attack Daughter    Colon cancer Neg Hx    Stroke Neg Hx     Prior to Admission medications   Medication Sig Start Date End Date Taking? Authorizing Provider  acetaminophen (TYLENOL) 325 MG tablet Take 2 tablets (650 mg total) by mouth every 6 (six) hours as needed for mild pain, fever or headache (or Fever >/= 101). 03/29/20   Roxan Hockey, MD  albuterol (VENTOLIN HFA) 108 (90 Base) MCG/ACT inhaler Inhale 2 puffs into the lungs every 6 (six) hours as needed for wheezing or shortness of breath. 03/29/20   Roxan Hockey, MD  amLODipine (NORVASC) 10 MG tablet Take 10 mg by mouth daily.    [provider]  aspirin 81 MG EC tablet Take 1 tablet (81 mg total) by mouth daily with breakfast. 03/29/20   Roxan Hockey, MD  atorvastatin (LIPITOR) 40 MG tablet Take 1 tablet (40 mg total) by mouth daily. 11/30/13   Carlena Bjornstad, MD  famotidine (PEPCID) 20 MG tablet One after supper 06/07/20   Tanda Rockers, MD  HYDROcodone-acetaminophen (NORCO/VICODIN) 5-325 MG tablet Take 1 tablet by mouth daily as needed for moderate pain. 12/02/21   Kathie Dike, MD  ipratropium (ATROVENT) 0.03 % nasal spray Place 2 sprays into both nostrils as needed for rhinitis.    [provider]  ipratropium-albuterol (DUONEB) 0.5-2.5 (3) MG/3ML SOLN Take 3 mLs by nebulization every 4 (four) hours as needed. 03/29/20   Roxan Hockey, MD  isosorbide mononitrate (IMDUR) 60 MG 24 hr tablet Take 2 tablets (120 mg total) by mouth every evening. 04/11/20   Larey Dresser, MD  losartan (COZAAR) 25 MG tablet Take 1 tablet (25 mg total) by mouth daily. 03/29/20   Roxan Hockey, MD  metFORMIN (GLUCOPHAGE) 500 MG tablet Take 500 mg by mouth 2 (two) times daily. 12/18/21   [provider]  metoprolol succinate (TOPROL-XL) 25 MG 24  hr tablet Take 1 tablet (25 mg total) by mouth daily. Pt. Needs to make an appt. With Cardiologist in order to receive further refills. Thank You. 1st Attempt. 05/15/21   Dunn, Nedra Hai, PA-C  nitroGLYCERIN (NITROSTAT) 0.4 MG SL tablet Place 1 tablet (0.4 mg total) under the tongue every 5 (five) minutes x 3 doses as needed for chest pain. 12/14/19   Dunn, Nedra Hai, PA-C  OXYGEN Inhale 3 L into the lungs at bedtime.    [provider]  pantoprazole (PROTONIX) 40 MG tablet Take 1 tablet (40 mg total) by mouth daily. Take 30-60 min before first meal of the day 06/07/20   Tanda Rockers, MD  potassium chloride SA (KLOR-CON M) 20 MEQ tablet Take 20 mEq by mouth daily. 12/14/21   [provider]  spironolactone (ALDACTONE) 25 MG tablet Take 0.5 tablets (12.5 mg total) by mouth daily. 12/19/21   Larey Dresser, MD  tamsulosin (FLOMAX) 0.4 MG CAPS capsule Take 1 capsule (0.4 mg total) by mouth daily after supper. 12/21/21   McKenzie, Candee Furbish, MD  torsemide (DEMADEX) 10 MG tablet Take 1 tablet (10 mg total) by mouth every other day. Take 20 mg for weight gain over 3 lbs in 24 hours 04/13/20   Larey Dresser, MD  Vitamin D, Ergocalciferol, (DRISDOL) 1.25 MG (50000 UNIT) CAPS capsule Take 1 capsule by mouth once a week. 12/27/21   [provider]  warfarin (COUMADIN) 2 MG tablet Take 2 mg by mouth daily. 12/19/21   [provider]    Physical Exam: Vitals:   01/02/22 0847 01/02/22 0850  BP: (!) 142/57 (!) 142/57  Pulse: 61 60  Resp: 11 20  Temp:  98.1 F (36.7 C)  TempSrc:  Oral  SpO2: 99% 97%  Weight: 90.9 kg 88.9 kg  Height: '5\' 10"'$  (1.778 m) '5\' 10"'$  (1.778 m)    Constitutional: frail, chronically ill appearing male, NAD, calm, comfortable Eyes: PERRL, lids and conjunctivae normal ENMT: Mucous membranes are dry. Posterior pharynx clear of any exudate or lesions.Normal dentition.  Neck: normal, supple, no masses, no thyromegaly Respiratory: clear to auscultation  bilaterally, no wheezing, no crackles. Normal respiratory effort. No accessory muscle use.  Cardiovascular: irregularly irregular, normal s1, s2 sounds, no murmurs / rubs / gallops. No extremity edema. 2+ pedal pulses. No carotid bruits.  Abdomen: no tenderness, no masses palpated. No hepatosplenomegaly. Bowel sounds positive.  GU: Gross  Hematuria seen. CBI process in place.  Musculoskeletal: no clubbing / cyanosis. No joint deformity upper and lower extremities. Good ROM, no contractures. Normal muscle tone.  Skin: no rashes, lesions, ulcers. No induration Neurologic: CN 2-12 grossly intact. Sensation intact, DTR normal. Strength 5/5 in all 4.  Psychiatric: Normal judgment and insight. Alert and oriented x 3. Normal mood.   Labs on Admission: I have personally reviewed following labs and imaging studies  CBC: Recent Labs  Lab 01/02/22 0916  WBC 6.2  NEUTROABS 4.2  HGB 12.3*  HCT 38.4*  MCV 88.3  PLT 474   Basic Metabolic Panel: Recent Labs  Lab 01/02/22 0916  NA 140  K 3.9  CL 104  CO2 26  GLUCOSE 124*  BUN 27*  CREATININE 0.98  CALCIUM 9.0   GFR: Estimated Creatinine Clearance: 69.8 mL/min (by C-G formula based on SCr of 0.98 mg/dL). Liver Function Tests: No results for input(s): "AST", "ALT", "ALKPHOS", "BILITOT", "PROT", "ALBUMIN" in the last 168 hours. No results for input(s): "LIPASE", "AMYLASE" in the last 168 hours. No results for input(s): "AMMONIA" in the last 168 hours. Coagulation Profile: Recent Labs  Lab 01/02/22 0916  INR 3.3*   Cardiac Enzymes: No results for input(s): "CKTOTAL", "CKMB", "CKMBINDEX", "TROPONINI" in the last 168 hours. BNP (last 3 results) No results for input(s): "PROBNP" in the last 8760 hours. HbA1C: No results for input(s): "HGBA1C" in the last 72 hours. CBG: No results for input(s): "GLUCAP" in the last 168 hours. Lipid Profile: No results for input(s): "CHOL", "HDL", "LDLCALC", "TRIG", "CHOLHDL", "LDLDIRECT" in the last 72  hours. Thyroid Function Tests: No results for input(s): "TSH", "T4TOTAL", "FREET4", "T3FREE", "THYROIDAB" in the last 72 hours. Anemia Panel: No results for input(s): "VITAMINB12", "FOLATE", "FERRITIN", "TIBC", "IRON", "RETICCTPCT" in the last 72 hours. Urine analysis:    Component Value Date/Time   COLORURINE YELLOW 11/28/2021 0358   APPEARANCEUR HAZY (A) 11/28/2021 0358   APPEARANCEUR Cloudy (A) 11/13/2021 1111   LABSPEC 1.012 11/28/2021 0358   PHURINE 7.0 11/28/2021 0358   GLUCOSEU NEGATIVE 11/28/2021 0358   HGBUR SMALL (A) 11/28/2021 0358   BILIRUBINUR NEGATIVE 11/28/2021 0358   BILIRUBINUR Negative 11/13/2021 1111   KETONESUR NEGATIVE 11/28/2021 0358   PROTEINUR 100 (A) 11/28/2021 0358   NITRITE POSITIVE (A) 11/28/2021 0358   LEUKOCYTESUR LARGE (A) 11/28/2021 0358    Radiological Exams on Admission: No results found.  EKG: Independently reviewed.   Assessment/Plan Principal Problem:   Acute urinary obstruction Active Problems:   --Severe pulmonary HTN, 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 ----Pulmonary HTN    Gross hematuria   Hyperlipidemia   CORONARY ARTERY BYPASS GRAFT, HX OF   Renal artery stenosis (HCC)   S/P femoropopliteal bypass surgery   Carotid artery disease (HCC)   Type 2 diabetes mellitus (HCC)   Hypertension   CAD (coronary artery disease)   Esophageal dysphagia   Peripheral vascular disease, unspecified (HCC)   Atrial fibrillation (HCC)   DOE (dyspnea on exertion)   Acquired thrombophilia (HCC)   Chronic heart failure with preserved ejection fraction (HFpEF) (HCC)   Assessment and Plan: * Acute urinary obstruction - Secondary to large blood clots in the bladder from gross hematuria - Fortunately urologist was able to get Foley placed in the ED - Continue to monitor urine output closely while on CBI   Gross hematuria - Seen by urologist, recommending continuous bladder irrigation overnight  --Severe pulmonary HTN, 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 ----Pulmonary HTN  -  Followed by advanced heart failure clinic  with Dr. Aundra Dubin  Chronic heart failure with preserved ejection fraction (HFpEF) (HCC) - Followed closely by advanced heart failure service  Acquired thrombophilia ([place]) - Patient is anticoagulated with warfarin - INR is 3.3 which is therapeutic - Urology requested holding warfarin for now given active gross hematuria - Follow and trend PT/INR daily  DOE (dyspnea on exertion) - This is a chronic problem, supplemental oxygen as needed  Atrial fibrillation ([place]) - Resume home rate controlling medications however we will be holding warfarin for now  Peripheral vascular disease, unspecified ([place]) - He is being followed outpatient by vascular surgery  Esophageal dysphagia - Softer diet ordered  CAD (coronary artery disease) - Resume home atorvastatin, temporarily holding aspirin given active bleeding, resume Imdur Cozaar and metoprolol  Hypertension - Well managed, resuming home meds as noted  Type 2 diabetes mellitus ([place]) - Holding home metformin in the hospital, SSI coverage and CBG monitoring ordered with carbohydrate modified diet  Carotid artery disease ([place]) - Stable currently being monitored outpatient by cardiology team, recently seen by Dr. Loralie Champagne  S/P femoropopliteal bypass surgery - Outpatient follow-up with Dr. Donnetta Hutching with vascular surgery  Renal artery stenosis ([place]) - Chronic, follow-up with vascular surgery, previously noted to be 50-70% occluded  CORONARY ARTERY BYPASS GRAFT, HX OF - Resume home meds as noted  Hyperlipidemia - Resume home atorvastatin  DVT prophylaxis: on warfarin INR 3.3  Code Status: Full   Family Communication:   Disposition Plan: anticipate home   Consults called: urologist Dr. Felipa Eth   Admission status: OBS  Level of care: Med-Surg Irwin Brakeman MD [facility] How to contact the [facility] Attending or Consulting provider [place] or covering provider during after hours 7P  -7A, for this patient?  Check the care team in [facility] and look for a) attending/consulting [facility] provider listed and b) the [facility] team listed Log into [email or web address] and use 's universal password to access. If you do not have the password, please contact the hospital operator. Locate the [facility] provider you are looking for under [facility] and page to a number that you can be directly reached. If you still have difficulty reaching the provider, please page the [facility] (Director on Call) for the Hospitalists listed on amion for assistance.   If 7PM-7AM, please contact night-coverage [email or web address] Password [email or web address]  01/02/2022, 1:10 PM

## 2022-01-02 NOTE — Assessment & Plan Note (Signed)
-   Chronic, follow-up with vascular surgery, previously noted to be 50-70% occluded

## 2022-01-02 NOTE — Assessment & Plan Note (Signed)
-   Outpatient follow-up with Dr. Donnetta Hutching with vascular surgery

## 2022-01-02 NOTE — Assessment & Plan Note (Signed)
-   Secondary to large blood clots in the bladder from gross hematuria - Fortunately urologist was able to get Foley placed in the ED - Continue to monitor urine output closely while on CBI

## 2022-01-02 NOTE — ED Provider Notes (Signed)
[facility] EMERGENCY DEPARTMENT Provider Note   CSN: [id number] Arrival date & time: 01/02/22  4196     History  Chief Complaint  Patient presents with   Hematuria    Troy [name] is a 81 y.o. male.  Pt is a 81 yo male with a pmhx significant for HTN, renal artery stenosis, CAD, HLD, DM, GERD, AAA, pulmonary htn, prostate cancer, afib on coumadin and CHF.  Pt said he woke up this am around 0600 and urinated.  He passed a large blood clot and has been unable to urinate since then.  Pt has had intermittent problems with hematuria and has been scheduled for a cystoscopy tomorrow.  Pt has a lot of discomfort in his lower abd.       Home Medications Prior to Admission medications   Medication Sig Start Date End Date Taking? Authorizing Provider  acetaminophen (TYLENOL) 325 MG tablet Take 2 tablets (650 mg total) by mouth every 6 (six) hours as needed for mild pain, fever or headache (or Fever >/= 101). 03/29/20   Roxan Hockey, MD  albuterol (VENTOLIN HFA) 108 (90 Base) MCG/ACT inhaler Inhale 2 puffs into the lungs every 6 (six) hours as needed for wheezing or shortness of breath. 03/29/20   Roxan Hockey, MD  amLODipine (NORVASC) 10 MG tablet Take 10 mg by mouth daily.    [provider]  aspirin 81 MG EC tablet Take 1 tablet (81 mg total) by mouth daily with breakfast. 03/29/20   Denton Brick, Courage, MD  atorvastatin (LIPITOR) 40 MG tablet Take 1 tablet (40 mg total) by mouth daily. 11/30/13   Carlena Bjornstad, MD  famotidine (PEPCID) 20 MG tablet One after supper 06/07/20   Tanda Rockers, MD  HYDROcodone-acetaminophen (NORCO/VICODIN) 5-325 MG tablet Take 1 tablet by mouth daily as needed for moderate pain. 12/02/21   Kathie Dike, MD  ipratropium (ATROVENT) 0.03 % nasal spray Place 2 sprays into both nostrils as needed for rhinitis.    [provider]  ipratropium-albuterol (DUONEB) 0.5-2.5 (3) MG/3ML SOLN Take 3 mLs by nebulization every 4 (four) hours as  needed. 03/29/20   Roxan Hockey, MD  isosorbide mononitrate (IMDUR) 60 MG 24 hr tablet Take 2 tablets (120 mg total) by mouth every evening. 04/11/20   Larey Dresser, MD  losartan (COZAAR) 25 MG tablet Take 1 tablet (25 mg total) by mouth daily. 03/29/20   Roxan Hockey, MD  metFORMIN (GLUCOPHAGE) 500 MG tablet Take 500 mg by mouth 2 (two) times daily. 12/18/21   [provider]  metoprolol succinate (TOPROL-XL) 25 MG 24 hr tablet Take 1 tablet (25 mg total) by mouth daily. Pt. Needs to make an appt. With Cardiologist in order to receive further refills. Thank You. 1st Attempt. 05/15/21   Dunn, Nedra Hai, PA-C  nitroGLYCERIN (NITROSTAT) 0.4 MG SL tablet Place 1 tablet (0.4 mg total) under the tongue every 5 (five) minutes x 3 doses as needed for chest pain. 12/14/19   Dunn, Nedra Hai, PA-C  OXYGEN Inhale 3 L into the lungs at bedtime.    [provider]  pantoprazole (PROTONIX) 40 MG tablet Take 1 tablet (40 mg total) by mouth daily. Take 30-60 min before first meal of the day 06/07/20   Tanda Rockers, MD  potassium chloride SA (KLOR-CON M) 20 MEQ tablet Take 20 mEq by mouth daily. 12/14/21   [provider]  spironolactone (ALDACTONE) 25 MG tablet Take 0.5 tablets (12.5 mg total) by mouth daily. 12/19/21  Larey Dresser, MD  tamsulosin (FLOMAX) 0.4 MG CAPS capsule Take 1 capsule (0.4 mg total) by mouth daily after supper. 12/21/21   McKenzie, Candee Furbish, MD  torsemide (DEMADEX) 10 MG tablet Take 1 tablet (10 mg total) by mouth every other day. Take 20 mg for weight gain over 3 lbs in 24 hours 04/13/20   Larey Dresser, MD  Vitamin D, Ergocalciferol, (DRISDOL) 1.25 MG (50000 UNIT) CAPS capsule Take 1 capsule by mouth once a week. 12/27/21   [provider]  warfarin (COUMADIN) 2 MG tablet Take 2 mg by mouth daily. 12/19/21   [provider]      Allergies    Niacin, Contrast media [iodinated contrast media], Iodine-131, and Nsaids    Review of Systems    Review of Systems  Genitourinary:  Positive for difficulty urinating and hematuria.  All other systems reviewed and are negative.   Physical Exam Updated Vital Signs BP (!) 142/57 (BP Location: Left Arm)   Pulse 60   Temp 98.1 F (36.7 C) (Oral)   Resp 20   Ht '5\' 10"'$  (1.778 m)   Wt 88.9 kg   SpO2 97%   BMI 28.12 kg/m  Physical Exam Vitals and nursing note reviewed.  Constitutional:      Appearance: Normal appearance.  HENT:     Head: Normocephalic and atraumatic.     Right Ear: External ear normal.     Left Ear: External ear normal.     Nose: Nose normal.     Mouth/Throat:     Mouth: Mucous membranes are moist.     Pharynx: Oropharynx is clear.  Eyes:     Extraocular Movements: Extraocular movements intact.     Conjunctiva/sclera: Conjunctivae normal.     Pupils: Pupils are equal, round, and reactive to light.  Cardiovascular:     Rate and Rhythm: Normal rate. Rhythm irregular.     Pulses: Normal pulses.     Heart sounds: Normal heart sounds.  Pulmonary:     Effort: Pulmonary effort is normal.     Breath sounds: Normal breath sounds.  Abdominal:     General: Abdomen is flat. Bowel sounds are normal.     Palpations: Abdomen is soft.  Musculoskeletal:        General: Normal range of motion.     Cervical back: Normal range of motion and neck supple.  Skin:    General: Skin is warm.     Capillary Refill: Capillary refill takes less than 2 seconds.  Neurological:     General: No focal deficit present.     Mental Status: He is alert and oriented to person, place, and time.  Psychiatric:        Mood and Affect: Mood normal.        Behavior: Behavior normal.     ED Results / Procedures / Treatments   Labs (all labs ordered are listed, but only abnormal results are displayed) Labs Reviewed  CBC WITH DIFFERENTIAL/PLATELET - Abnormal; Notable for the following components:      Result Value   Hemoglobin 12.3 (*)    HCT 38.4 (*)    RDW 15.9 (*)    All other  components within normal limits  BASIC METABOLIC PANEL - Abnormal; Notable for the following components:   Glucose, Bld 124 (*)    BUN 27 (*)    All other components within normal limits  PROTIME-INR - Abnormal; Notable for the following components:   Prothrombin Time 33.3 (*)  INR 3.3 (*)    All other components within normal limits  URINE CULTURE  URINALYSIS, ROUTINE W REFLEX MICROSCOPIC    EKG None  Radiology No results found.  Procedures Procedures    Medications Ordered in ED Medications  sodium chloride irrigation 0.9 % 3,000 mL (3,000 mLs Irrigation New Bag/Given 01/02/22 1244)  lidocaine (XYLOCAINE) 2 % jelly 1 Application (1 Application Urethral Given 01/02/22 1110)  HYDROmorphone (DILAUDID) injection 1 mg (1 mg Intramuscular Given 01/02/22 1033)    ED Course/ Medical Decision Making/ A&P                           Medical Decision Making Amount and/or Complexity of Data Reviewed Labs: ordered.  Risk Prescription drug management. Decision regarding hospitalization.   This patient presents to the ED for concern of hematuria and urinary retention, this involves an extensive number of treatment options, and is a complaint that carries with it a high risk of complications and morbidity.  The differential diagnosis includes uti, elevated inr, bph   Co morbidities that complicate the patient evaluation  HTN, renal artery stenosis, CAD, HLD, DM, GERD, AAA, pulmonary htn, prostate cancer, afib on coumadin and CHF   Additional history obtained:  Additional history obtained from epic chart review   Lab Tests:  I Ordered, and personally interpreted labs.  The pertinent results include:  cbc 12.3 (improved from 1 mo ago when it was 9.9), bmp nl, inr 3.3    Cardiac Monitoring:  The patient was maintained on a cardiac monitor.  I personally viewed and interpreted the cardiac monitored which showed an underlying rhythm of: afib   Medicines ordered and  prescription drug management:  I ordered medication including dilaudid  for pain  Reevaluation of the patient after these medicines showed that the patient improved I have reviewed the patients home medicines and have made adjustments as needed   Consultations Obtained:  I requested consultation with the urologist,  and discussed lab and imaging findings as well as pertinent plan - the came to see pt in the ED and were able to place a foley.  Dr. Felipa Eth recommends admission to medicine for observation. Pt d/w Dr. Manuella Ghazi (triad) for admission   Problem List / ED Course:  Urinary retention:  Nurses unable to get foley to drain despite irrigation.  Urology able to place one. Hematuria:  hgb ok.  Urine flowing well now that urology placed a catheter. Anticoagulated on coumadin:  INR elevated; will need to hold coumadin today   Reevaluation:  After the interventions noted above, I reevaluated the patient and found that they have :improved   Social Determinants of Health:  Lives at home   Dispostion:  After consideration of the diagnostic results and the patients response to treatment, I feel that the patent would benefit from admission for obs.          Final Clinical Impression(s) / ED Diagnoses Final diagnoses:  Urinary retention  Gross hematuria  Anticoagulated on Coumadin    Rx / DC Orders ED Discharge Orders     None         Isla Pence, MD 01/02/22 1252

## 2022-01-02 NOTE — Assessment & Plan Note (Signed)
-   Resume home atorvastatin, temporarily holding aspirin given active bleeding, resume Imdur Cozaar and metoprolol

## 2022-01-02 NOTE — Assessment & Plan Note (Signed)
-   Well managed, resuming home meds as noted

## 2022-01-02 NOTE — Assessment & Plan Note (Signed)
-   This is a chronic problem, supplemental oxygen as needed

## 2022-01-02 NOTE — Assessment & Plan Note (Signed)
-   Followed by advanced heart failure clinic with Dr. Aundra Dubin

## 2022-01-02 NOTE — Assessment & Plan Note (Signed)
-   Resume home meds as noted

## 2022-01-02 NOTE — Assessment & Plan Note (Signed)
-   Seen by urologist, recommending continuous bladder irrigation overnight

## 2022-01-02 NOTE — ED Notes (Signed)
Dr. Kyra Manges at bedside, instructed nurse to clamp continuous irrigation, no blood clots noted at present

## 2022-01-02 NOTE — ED Notes (Signed)
Dr.Stoneking and Dr. [name] at bedside assisted with cystoscopy, 20 Fr. Coude foley placed with guide wire. Patient tolerated procedure. Continuous  bladder irrigation 3000 ml tubing intact and patent.

## 2022-01-02 NOTE — Assessment & Plan Note (Signed)
-   Resume home rate controlling medications however we will be holding warfarin for now

## 2022-01-02 NOTE — ED Triage Notes (Signed)
Pt to the ED with complaints of hematuria at 0600 this morning with blood clots.  Pt has not been able to urinate since.  Pt is experiencing lower abdominal pain with back pain.

## 2022-01-02 NOTE — Assessment & Plan Note (Signed)
-   Holding home metformin in the hospital, SSI coverage and CBG monitoring ordered with carbohydrate modified diet

## 2022-01-02 NOTE — Hospital Course (Signed)
81 year old gentleman with chronic diastolic heart failure, coronary artery disease status post CABG 2000, PAD, permanent atrial fibrillation, acquired thrombophilia anticoagulated with warfarin, history of orchitis, hyperlipidemia, hypertension, renal artery stenosis, severe pulmonary hypertension, type 2 diabetes mellitus, recurrent bouts of gross hematuria followed by urology presents to the emergency department with acute inability to urinate.  Patient reported that he started having gross hematuria at around 6 AM with blood clots and subsequently has not been able to urinate since then.  He is also complaining of lower abdominal and back pain.  He denies fever and chills.  He reports that he had been scheduled for an outpatient cystoscopy on 01/03/2022.  His discomfort has progressively worsened prompting ED visit.  The ED staff attempted to place Foley catheter but was unsuccessful.  Urologist Dr. Felipa Eth was called and he was able to place a Foley catheter with wires.  He recommended patient be observed in the hospital for starting continuous bladder irrigation and monitoring urine output.  He recommended holding warfarin for now.  Patient is being admitted into the hospital for further management.

## 2022-01-02 NOTE — Assessment & Plan Note (Signed)
-   Resume home atorvastatin

## 2022-01-02 NOTE — Assessment & Plan Note (Signed)
-   Stable currently being monitored outpatient by cardiology team, recently seen by Dr. Loralie Champagne

## 2022-01-02 NOTE — ED Notes (Signed)
Spoke with urologist, Dr Felipa Eth  [phone], regarding hematuria and difficulty voiding. Urologist informed nurse he will come to ED for assessment and evaluation.

## 2022-01-02 NOTE — Assessment & Plan Note (Signed)
-   Patient is anticoagulated with warfarin - INR is 3.3 which is therapeutic - Urology requested holding warfarin for now given active gross hematuria - Follow and trend PT/INR daily

## 2022-01-03 ENCOUNTER — Observation Stay (HOSPITAL_COMMUNITY): Payer: No Typology Code available for payment source

## 2022-01-03 ENCOUNTER — Other Ambulatory Visit: Payer: Medicare HMO | Admitting: Urology

## 2022-01-03 DIAGNOSIS — N3041 Irradiation cystitis with hematuria: Secondary | ICD-10-CM | POA: Diagnosis present

## 2022-01-03 DIAGNOSIS — N3289 Other specified disorders of bladder: Secondary | ICD-10-CM | POA: Diagnosis present

## 2022-01-03 DIAGNOSIS — I4821 Permanent atrial fibrillation: Secondary | ICD-10-CM | POA: Diagnosis present

## 2022-01-03 DIAGNOSIS — Z87891 Personal history of nicotine dependence: Secondary | ICD-10-CM | POA: Diagnosis not present

## 2022-01-03 DIAGNOSIS — R319 Hematuria, unspecified: Secondary | ICD-10-CM | POA: Diagnosis present

## 2022-01-03 DIAGNOSIS — R1319 Other dysphagia: Secondary | ICD-10-CM

## 2022-01-03 DIAGNOSIS — Z8249 Family history of ischemic heart disease and other diseases of the circulatory system: Secondary | ICD-10-CM | POA: Diagnosis not present

## 2022-01-03 DIAGNOSIS — R339 Retention of urine, unspecified: Secondary | ICD-10-CM | POA: Diagnosis present

## 2022-01-03 DIAGNOSIS — I252 Old myocardial infarction: Secondary | ICD-10-CM | POA: Diagnosis not present

## 2022-01-03 DIAGNOSIS — Y842 Radiological procedure and radiotherapy as the cause of abnormal reaction of the patient, or of later complication, without mention of misadventure at the time of the procedure: Secondary | ICD-10-CM | POA: Diagnosis present

## 2022-01-03 DIAGNOSIS — I110 Hypertensive heart disease with heart failure: Secondary | ICD-10-CM | POA: Diagnosis present

## 2022-01-03 DIAGNOSIS — Z7901 Long term (current) use of anticoagulants: Secondary | ICD-10-CM | POA: Diagnosis not present

## 2022-01-03 DIAGNOSIS — E1151 Type 2 diabetes mellitus with diabetic peripheral angiopathy without gangrene: Secondary | ICD-10-CM | POA: Diagnosis present

## 2022-01-03 DIAGNOSIS — I5032 Chronic diastolic (congestive) heart failure: Secondary | ICD-10-CM | POA: Diagnosis present

## 2022-01-03 DIAGNOSIS — E785 Hyperlipidemia, unspecified: Secondary | ICD-10-CM | POA: Diagnosis present

## 2022-01-03 DIAGNOSIS — D6859 Other primary thrombophilia: Secondary | ICD-10-CM | POA: Diagnosis present

## 2022-01-03 DIAGNOSIS — N503 Cyst of epididymis: Secondary | ICD-10-CM | POA: Diagnosis not present

## 2022-01-03 DIAGNOSIS — I272 Pulmonary hypertension, unspecified: Secondary | ICD-10-CM

## 2022-01-03 DIAGNOSIS — R1314 Dysphagia, pharyngoesophageal phase: Secondary | ICD-10-CM | POA: Diagnosis present

## 2022-01-03 DIAGNOSIS — N50812 Left testicular pain: Secondary | ICD-10-CM | POA: Diagnosis present

## 2022-01-03 DIAGNOSIS — J9611 Chronic respiratory failure with hypoxia: Secondary | ICD-10-CM | POA: Diagnosis present

## 2022-01-03 DIAGNOSIS — Z833 Family history of diabetes mellitus: Secondary | ICD-10-CM | POA: Diagnosis not present

## 2022-01-03 DIAGNOSIS — I2510 Atherosclerotic heart disease of native coronary artery without angina pectoris: Secondary | ICD-10-CM | POA: Diagnosis present

## 2022-01-03 DIAGNOSIS — I701 Atherosclerosis of renal artery: Secondary | ICD-10-CM | POA: Diagnosis present

## 2022-01-03 DIAGNOSIS — N139 Obstructive and reflux uropathy, unspecified: Secondary | ICD-10-CM | POA: Diagnosis present

## 2022-01-03 DIAGNOSIS — Z8546 Personal history of malignant neoplasm of prostate: Secondary | ICD-10-CM | POA: Diagnosis not present

## 2022-01-03 DIAGNOSIS — R310 Gross hematuria: Secondary | ICD-10-CM | POA: Diagnosis not present

## 2022-01-03 DIAGNOSIS — I714 Abdominal aortic aneurysm, without rupture, unspecified: Secondary | ICD-10-CM | POA: Diagnosis present

## 2022-01-03 DIAGNOSIS — Y832 Surgical operation with anastomosis, bypass or graft as the cause of abnormal reaction of the patient, or of later complication, without mention of misadventure at the time of the procedure: Secondary | ICD-10-CM | POA: Diagnosis present

## 2022-01-03 DIAGNOSIS — T82898A Other specified complication of vascular prosthetic devices, implants and grafts, initial encounter: Secondary | ICD-10-CM | POA: Diagnosis present

## 2022-01-03 DIAGNOSIS — D6869 Other thrombophilia: Secondary | ICD-10-CM | POA: Diagnosis not present

## 2022-01-03 DIAGNOSIS — I4819 Other persistent atrial fibrillation: Secondary | ICD-10-CM | POA: Diagnosis not present

## 2022-01-03 DIAGNOSIS — K219 Gastro-esophageal reflux disease without esophagitis: Secondary | ICD-10-CM | POA: Diagnosis present

## 2022-01-03 LAB — CBC
HCT: 33.9 % — ABNORMAL LOW (ref 39.0–52.0)
Hemoglobin: 11 g/dL — ABNORMAL LOW (ref 13.0–17.0)
MCH: 28.7 pg (ref 26.0–34.0)
MCHC: 32.4 g/dL (ref 30.0–36.0)
MCV: 88.5 fL (ref 80.0–100.0)
Platelets: 143 10*3/uL — ABNORMAL LOW (ref 150–400)
RBC: 3.83 MIL/uL — ABNORMAL LOW (ref 4.22–5.81)
RDW: 16.2 % — ABNORMAL HIGH (ref 11.5–15.5)
WBC: 10 10*3/uL (ref 4.0–10.5)
nRBC: 0 % (ref 0.0–0.2)

## 2022-01-03 LAB — BASIC METABOLIC PANEL
Anion gap: 9 (ref 5–15)
BUN: 21 mg/dL (ref 8–23)
CO2: 26 mmol/L (ref 22–32)
Calcium: 8.9 mg/dL (ref 8.9–10.3)
Chloride: 103 mmol/L (ref 98–111)
Creatinine, Ser: 0.84 mg/dL (ref 0.61–1.24)
GFR, Estimated: >60 mL/min (ref >60)
Glucose, Bld: 145 mg/dL — ABNORMAL HIGH (ref 70–99)
Potassium: 3.6 mmol/L (ref 3.5–5.1)
Sodium: 138 mmol/L (ref 135–145)

## 2022-01-03 LAB — GLUCOSE, CAPILLARY
Glucose-Capillary: 116 mg/dL — ABNORMAL HIGH (ref 70–99)
Glucose-Capillary: 157 mg/dL — ABNORMAL HIGH (ref 70–99)

## 2022-01-03 LAB — PROTIME-INR
INR: 3 — ABNORMAL HIGH (ref 0.8–1.2)
Prothrombin Time: 31.2 seconds — ABNORMAL HIGH (ref 11.4–15.2)

## 2022-01-03 MED ORDER — INSULIN ASPART 100 UNIT/ML IJ SOLN
0.0000 [IU] | Freq: Three times a day (TID) | INTRAMUSCULAR | Status: DC
  Administered 2022-01-04: 1 [IU] via SUBCUTANEOUS
  Administered 2022-01-04: 2 [IU] via SUBCUTANEOUS

## 2022-01-03 MED ORDER — SPIRONOLACTONE 12.5 MG HALF TABLET
12.5000 mg | ORAL_TABLET | Freq: Every day | ORAL | Status: DC
  Administered 2022-01-04: 12.5 mg via ORAL
  Filled 2022-01-03: qty 1

## 2022-01-03 NOTE — Progress Notes (Signed)
  Transition of Care [facility]) Screening Note   Patient Details  Name: Troy [name] Date of Birth: 07-22-1940   Transition of [facility]) CM/SW Contact:    Ihor Gully, LCSW Phone Number: 01/03/2022, 10:25 AM    Transition of Care Department [facility]) has reviewed patient and no TOC needs have been identified at this time. We will continue to monitor patient advancement through interdisciplinary progression rounds. If new patient transition needs arise, please place a TOC consult.

## 2022-01-03 NOTE — Progress Notes (Signed)
Pt has 800 mL of pink fluid with clots noted in drainage bag. 98/40 manual blood pressure and pulse continued to run low during the night. Charge nurse notified. Pt slept through the night, 2/10 pain in lower back reported. 22g IV started in RFA.

## 2022-01-03 NOTE — Progress Notes (Addendum)
PROGRESS NOTE  Troy [name] IFO:[id number] DOB: [birth year] DOA: 01/02/2022 PCP: Sharilyn Sites, MD  Brief History:  81 year old gentleman with chronic diastolic heart failure, coronary artery disease status post CABG 2000, PAD, permanent atrial fibrillation, acquired thrombophilia anticoagulated with warfarin, history of orchitis, hyperlipidemia, hypertension, renal artery stenosis, severe pulmonary hypertension, type 2 diabetes mellitus, recurrent bouts of gross hematuria followed by urology presents to the emergency department with acute inability to urinate.  Patient reported that he started having gross hematuria at around 6 AM with blood clots and subsequently has not been able to urinate since then.  He is also complaining of lower abdominal and back pain.  He denies fever and chills.  He reports that he had been scheduled for an outpatient cystoscopy on 01/03/2022.  His discomfort has progressively worsened prompting ED visit.  The ED staff attempted to place Foley catheter but was unsuccessful.  Urologist Dr. Felipa Eth was called and he was able to place a Foley catheter over a wire.  He recommended patient be observed in the hospital for starting continuous bladder irrigation and monitoring urine output.  He recommended holding warfarin for now.  Patient is being admitted into the hospital for further management.   Assessment/Plan:   Principal Problem:   Acute urinary obstruction Active Problems:   --Severe pulmonary HTN, 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 ----Pulmonary HTN    Hyperlipidemia   CORONARY ARTERY BYPASS GRAFT, HX OF   Renal artery stenosis (HCC)   S/P femoropopliteal bypass surgery   Carotid artery disease (HCC)   Type 2 diabetes mellitus (HCC)   Hypertension   CAD (coronary artery disease)   Esophageal dysphagia   Peripheral vascular disease, unspecified (HCC)   Atrial fibrillation (HCC)   DOE (dyspnea on exertion)   Acquired thrombophilia (HCC)   Chronic heart failure  with preserved ejection fraction (HFpEF) (HCC)   Gross hematuria  Assessment and Plan: Acute urine retention -appreciate urology -20 french 3 way foley was placed over a wire with the aid of cystoscopy  Gross hematuria:  -hematuria is likely related to a combination of an INR of 3.3 and radiation cystitis.  -continue to wean CBI to off.  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  - Dr. Aundra Dubin outpatient follow up after discharge    Permanent Atrial Fibrillation - HR well controlled, pt is anticoagulated with warfarin which is on hold due to hematuria  Acquired Thrombophilia -PharmD had been assisting with dosing during hospitalization -holding warfarin due to hematuria   Hyperlipidemia - resumed home statin   Type 2 diabetes mellitus, controlled with vascular complications  -SSI coverage and frequent CBG monitoring in addition to prandial NovoLog ordered -hold metformin - 08/29/21 A1C--6.5 -01/02/22 A1C--6.9   Esophageal dysphagia  -SLP saw 11/07/21 -SLP recommends regular diet with thin liquids   CAD s/p CABG at age 4 - he is being medically managed now and we have resumed his cardiac medications - Cath in 1/22 showed patent LIMA-LAD and SVG-D but occluded SVG-OM and SVG-RCA -continue imdur and metoprolol succinate and atorava -no chest pain presently   chronic respiratory failure with hypoxia -on 3L at night  at home   Essential hypertension  - resuming metoprolol succinate, imdur -holding amlodipine   PVD - severe  - he has had multiple bypass surgeries - resume medical management  - Occluded right fem-pop bypass.  No claudication - continue  ASA     Family Communication:   no Family at bedside  Consultants:  urology  Code Status:  FULL   DVT Prophylaxis:  warfarin on hold   Procedures: As Listed in Progress Note Above  Antibiotics: None      Subjective: Complains of left testicle pain--better than prior admission.  Patient denies fevers, chills, headache, chest pain, dyspnea, nausea, vomiting, diarrhea, abdominal pain, dysuria, hematuria, hematochezia, and melena.   Objective: Vitals:   01/02/22 2108 01/02/22 2117 01/02/22 2336 01/03/22 0345  BP: (!) 86/65 (!) 98/40 (!) 130/100 (!) 118/56  Pulse: (!) 54  (!) 50 (!) 56  Resp: '18  18 16  '$ Temp: 97.9 F (36.6 C)  (!) 97.2 F (36.2 C) 98 F (36.7 C)  TempSrc:      SpO2: 94%  95% 94%  Weight:      Height:        Intake/Output Summary (Last 24 hours) at 01/03/2022 0847 Last data filed at 01/03/2022 0559 Gross per 24 hour  Intake 240 ml  Output 800 ml  Net -560 ml   Weight change:  Exam:  General:  Pt is alert, follows commands appropriately, not in acute distress HEENT: No icterus, No thrush, No neck mass, [place]/AT Cardiovascular: IRRR, S1/S2, no rubs, no gallops Respiratory: CTA bilaterally, no wheezing, no crackles, no rhonchi Abdomen: Soft/+BS, non tender, non distended, no guarding Extremities: No edema, No lymphangitis, No petechiae, No rashes, no synovitis Left testicle without erythema or induration   Data Reviewed: I have personally reviewed following labs and imaging studies Basic Metabolic Panel: Recent Labs  Lab 01/02/22 0916 01/03/22 0324  NA 140 138  K 3.9 3.6  CL 104 103  CO2 26 26  GLUCOSE 124* 145*  BUN 27* 21  CREATININE 0.98 0.84  CALCIUM 9.0 8.9   Liver Function Tests: No results for input(s): "AST", "ALT", "ALKPHOS", "BILITOT", "PROT", "ALBUMIN" in the last 168 hours. No results for input(s): "LIPASE", "AMYLASE" in the last 168 hours. No results for input(s): "AMMONIA" in the last 168 hours. Coagulation  Profile: Recent Labs  Lab 01/02/22 0916 01/03/22 0324  INR 3.3* 3.0*   CBC: Recent Labs  Lab 01/02/22 0916 01/03/22 0324  WBC 6.2 10.0  NEUTROABS 4.2  --   HGB 12.3* 11.0*  HCT 38.4* 33.9*  MCV 88.3 88.5  PLT 158 143*   Cardiac Enzymes: No results for input(s): "CKTOTAL", "CKMB", "CKMBINDEX", "TROPONINI" in the last 168 hours. BNP: Invalid input(s): "POCBNP" CBG: Recent Labs  Lab 01/02/22 1824 01/02/22 2108 01/03/22 0724  GLUCAP 103* 133* 116*   HbA1C: Recent Labs    01/02/22 0916  HGBA1C 6.9*   Urine analysis:    Component Value Date/Time   COLORURINE RED (A) 01/02/2022 0857   APPEARANCEUR HAZY (A) 01/02/2022 0857   APPEARANCEUR Cloudy (A) 11/13/2021 1111   LABSPEC 1.005 01/02/2022 0857   PHURINE 8.0 01/02/2022 0857   GLUCOSEU NEGATIVE 01/02/2022 0857   HGBUR MODERATE (A) 01/02/2022 0857   BILIRUBINUR NEGATIVE 01/02/2022 0857   BILIRUBINUR Negative 11/13/2021 1111   KETONESUR NEGATIVE 01/02/2022 0857   PROTEINUR 30 (A) 01/02/2022 0857   NITRITE NEGATIVE 01/02/2022 0857   LEUKOCYTESUR NEGATIVE 01/02/2022 0857   Sepsis Labs: '@LABRCNTIP'$ (procalcitonin:4,lacticidven:4) )No results found for  this or any previous visit (from the past 240 hour(s)).   Scheduled Meds:  atorvastatin  40 mg Oral QPM   insulin aspart  0-15 Units Subcutaneous TID WC   isosorbide mononitrate  120 mg Oral QPM   losartan  25 mg Oral Daily   metoprolol succinate  25 mg Oral Daily   pantoprazole  40 mg Oral Q breakfast   [START ON 01/04/2022] spironolactone  12.5 mg Oral Daily   tamsulosin  0.4 mg Oral QPC supper   Continuous Infusions:  sodium chloride irrigation      Procedures/Studies: VAS US CAROTID  Result Date: 12/06/2021 Carotid Arterial Duplex Study Patient Name:  Troy [name]  Date of Exam:   12/06/2021 Medical Rec #: [id number]      Accession #:    [id number] Date of Birth: 1940/04/15     Patient Gender: M Patient Age:   89 years Exam Location:  [facility] Procedure:      VAS US CAROTID Referring Phys: TODD EARLY --------------------------------------------------------------------------------  Indications:       Carotid artery disease. Comparison Study:  11/02/20: Right 1-39% Left 40-59% (217/49 cm/s) Performing Technologist: Ralene Cork RVT  Examination Guidelines: A complete evaluation includes B-mode imaging, spectral Doppler, color Doppler, and power Doppler as needed of all accessible portions of each vessel. Bilateral testing is considered an integral part of a complete examination. Limited examinations for reoccurring indications may be performed as noted.  Right Carotid Findings: +----------+--------+--------+--------+------------------+--------+           PSV cm/sEDV cm/sStenosisPlaque DescriptionComments +----------+--------+--------+--------+------------------+--------+ CCA Prox  71      11              calcific                   +----------+--------+--------+--------+------------------+--------+ CCA Mid   112     15              calcific                   +----------+--------+--------+--------+------------------+--------+ CCA Distal126     18              heterogenous               +----------+--------+--------+--------+------------------+--------+ ICA Prox  151     32      1-39%   calcific                   +----------+--------+--------+--------+------------------+--------+ ICA Mid   112     24                                         +----------+--------+--------+--------+------------------+--------+ ICA Distal60      17                                         +----------+--------+--------+--------+------------------+--------+ ECA       170     0                                          +----------+--------+--------+--------+------------------+--------+ +----------+--------+-------+----------------+-------------------+           PSV cm/sEDV cmsDescribe  Arm Pressure (mmHG)  +----------+--------+-------+----------------+-------------------+ [id number]            Multiphasic, WNL                    +----------+--------+-------+----------------+-------------------+ +---------+--------+--+--------+--+---------+ VertebralPSV cm/s61EDV cm/s11Antegrade +---------+--------+--+--------+--+---------+  Left Carotid Findings: +----------+--------+--------+--------+-------------------------+--------+           PSV cm/sEDV cm/sStenosisPlaque Description       Comments +----------+--------+--------+--------+-------------------------+--------+ CCA Prox  76      13              calcific                          +----------+--------+--------+--------+-------------------------+--------+ CCA Mid   138     12              calcific                          +----------+--------+--------+--------+-------------------------+--------+ CCA Distal140     21              calcific                          +----------+--------+--------+--------+-------------------------+--------+ ICA Prox  193     48      40-59%  heterogenous and calcific         +----------+--------+--------+--------+-------------------------+--------+ ICA Mid   122     24                                                +----------+--------+--------+--------+-------------------------+--------+ ICA Distal70      21                                                +----------+--------+--------+--------+-------------------------+--------+ ECA       166     0               heterogenous and calcific         +----------+--------+--------+--------+-------------------------+--------+ +----------+--------+--------+----------------+-------------------+           PSV cm/sEDV cm/sDescribe        Arm Pressure (mmHG) +----------+--------+--------+----------------+-------------------+ [id number]             Multiphasic, WNL                     +----------+--------+--------+----------------+-------------------+ +---------+--------+--+--------+--+---------+ VertebralPSV cm/s45EDV cm/s11Antegrade +---------+--------+--+--------+--+---------+   Summary: Right Carotid: Velocities in the right ICA are consistent with a 1-39% stenosis. Left Carotid: Velocities in the left ICA are consistent with a 40-59% stenosis. Vertebrals:  Bilateral vertebral arteries demonstrate antegrade flow. Subclavians: Normal flow hemodynamics were seen in bilateral subclavian              arteries. *See table(s) above for measurements and observations.  Electronically signed by Curt Jews MD on 12/06/2021 at 4:10:37 PM.    Final     Orson Eva, DO  [facility]  If 7PM-7AM, please contact night-coverage [email or web address] Password [email or web address] 01/03/2022, 8:47 AM   LOS: 0 days

## 2022-01-03 NOTE — Progress Notes (Signed)
Pt has had no complaints today. Foley cath intact draining yellow urine with some sediment. Has not required any CBI fluid today. Tolerated food/fluids well, VSS. Has ambulated in room and to restroom with assistance, requires FWW and standby assist due to slow and occasionally unsteady gait.

## 2022-01-04 LAB — GLUCOSE, CAPILLARY
Glucose-Capillary: 181 mg/dL — ABNORMAL HIGH (ref 70–99)
Glucose-Capillary: 148 mg/dL — ABNORMAL HIGH (ref 70–99)
Glucose-Capillary: 155 mg/dL — ABNORMAL HIGH (ref 70–99)

## 2022-01-04 LAB — CBC
HCT: 33.2 % — ABNORMAL LOW (ref 39.0–52.0)
Hemoglobin: 10.6 g/dL — ABNORMAL LOW (ref 13.0–17.0)
MCH: 28.7 pg (ref 26.0–34.0)
MCHC: 31.9 g/dL (ref 30.0–36.0)
MCV: 90 fL (ref 80.0–100.0)
Platelets: 156 10*3/uL (ref 150–400)
RBC: 3.69 MIL/uL — ABNORMAL LOW (ref 4.22–5.81)
RDW: 16.4 % — ABNORMAL HIGH (ref 11.5–15.5)
WBC: 11.8 10*3/uL — ABNORMAL HIGH (ref 4.0–10.5)
nRBC: 0 % (ref 0.0–0.2)

## 2022-01-04 LAB — PROTIME-INR
INR: 2.6 — ABNORMAL HIGH (ref 0.8–1.2)
Prothrombin Time: 28 seconds — ABNORMAL HIGH (ref 11.4–15.2)

## 2022-01-04 LAB — URINE CULTURE: Culture: NO GROWTH

## 2022-01-04 NOTE — Progress Notes (Signed)
Pt had order for soap suds enema due to constipation x4 days. This nurse performed procedure, pt tolerated well. 2 small bowel movements following enema and pt voiced immediate relief. Will continue to monitor.

## 2022-01-04 NOTE — Discharge Summary (Addendum)
Physician Discharge Summary   Patient: Troy [name] MRN: [id number] DOB: February 10, 1941  Admit date:     01/02/2022  Discharge date: 01/04/22  Discharge Physician: Shanon Brow Danique Hartsough   PCP: Sharilyn Sites, MD   Recommendations at discharge:   Please follow up with primary care provider within 1-2 weeks  Please repeat BMP and CBC in one week Follow up Dr. Nicolette Bang 01/17/22    Hospital Course: 81 year old gentleman with chronic diastolic heart failure, coronary artery disease status post CABG 2000, PAD, permanent atrial fibrillation, acquired thrombophilia anticoagulated with warfarin, history of orchitis, hyperlipidemia, hypertension, renal artery stenosis, severe pulmonary hypertension, type 2 diabetes mellitus, recurrent bouts of gross hematuria followed by urology presents to the emergency department with acute inability to urinate.  Patient reported that he started having gross hematuria at around 6 AM with blood clots and subsequently has not been able to urinate since then.  He is also complaining of lower abdominal and back pain.  He denies fever and chills.  He reports that he had been scheduled for an outpatient cystoscopy on 01/03/2022.  His discomfort has progressively worsened prompting ED visit.  The ED staff attempted to place Foley catheter but was unsuccessful.  Urologist Dr. Felipa Eth was called and he was able to place a Foley catheter over a wire.  He recommended patient be observed in the hospital for starting continuous bladder irrigation and monitoring urine output.  He recommended holding warfarin for now.  Patient is being admitted into the hospital for further management.  Assessment and Plan: Acute urine retention -appreciate urology -20 french 3 way foley was placed over a wire with the aid of cystoscopy -pt to keep foley catheter at time of d/c  -follow up with Dr. [name] 01/17/22 in office   Gross hematuria:  -hematuria is likely related to a combination of an INR  of 3.3 and radiation cystitis.  -continue to wean CBI to off>>urine clear on 10/5 -appreciate urology follow up -10/5 discussed with Dr. 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  - Dr. Aundra Dubin outpatient follow up after discharge    Permanent Atrial Fibrillation - HR well controlled, pt is anticoagulated with warfarin which is on hold due to hematuria -restart warfarin at d/c now that hematuria resolved   Acquired Thrombophilia -PharmD had been assisting with dosing during hospitalization -holding warfarin due to hematuria>>restart after d/c now that hematuria resolved   Hyperlipidemia - resumed home statin   Type 2 diabetes mellitus, controlled with vascular complications  -SSI coverage and frequent CBG monitoring in addition to prandial NovoLog ordered -hold metformin>>restart after d/c - 08/29/21 A1C--6.5 -01/02/22 A1C--6.9 -CBGs controlled during the hospitalization   Esophageal dysphagia  -SLP saw 11/07/21 -SLP recommends regular diet with thin liquids   CAD s/p CABG at age 17 - he is being medically managed now and we have resumed his cardiac medications - Cath in 1/22 showed patent LIMA-LAD and SVG-D but occluded SVG-OM and SVG-RCA -continue imdur and metoprolol succinate and atorava -no chest pain presently   chronic respiratory failure with hypoxia -on 3L at night at home   Essential hypertension  - resuming metoprolol succinate, imdur -holding  amlodipine>>BP remains well controlled   PVD - severe  - he has had multiple bypass surgeries - resume medical management  - Occluded right fem-pop bypass.  No claudication -  continue ASA       Consultants: urology--[facility] Procedures performed: CBI  Disposition: Home Diet recommendation:  Cardiac diet DISCHARGE MEDICATION: Allergies as of 01/04/2022       Reactions   Niacin Itching, Rash   Burning sensation   Contrast Media [iodinated Contrast Media] Nausea And Vomiting   Iodine-131 Nausea And Vomiting   Nsaids Other (See Comments)   Taking Coumadin        Medication List     STOP taking these medications    amLODipine 10 MG tablet Commonly known as: NORVASC   potassium chloride SA 20 MEQ tablet Commonly known as: KLOR-CON M       TAKE these medications    acetaminophen 325 MG tablet Commonly known as: TYLENOL Take 2 tablets (650 mg total) by mouth every 6 (six) hours as needed for mild pain, fever or headache (or Fever >/= 101).   albuterol 108 (90 Base) MCG/ACT inhaler Commonly known as: VENTOLIN HFA Inhale 2 puffs into the lungs every 6 (six) hours as needed for wheezing or shortness of breath.   aspirin EC 81 MG tablet Take 1 tablet (81 mg total) by mouth daily with breakfast.   atorvastatin 40 MG tablet Commonly known as: LIPITOR Take 1 tablet (40 mg total) by mouth daily.   famotidine 20 MG tablet Commonly known as: Pepcid One after supper What changed:  how much to take how to take this when to take this   HYDROcodone-acetaminophen 5-325 MG tablet Commonly known as: NORCO/VICODIN Take 1 tablet by mouth daily as needed for moderate pain.   ipratropium 0.03 % nasal spray Commonly known as: ATROVENT Place 2 sprays into both nostrils as needed for rhinitis.   ipratropium-albuterol 0.5-2.5 (3) MG/3ML Soln Commonly known as: DUONEB Take 3 mLs by nebulization every 4 (four) hours as needed.   isosorbide mononitrate 60 MG 24 hr tablet Commonly known as: IMDUR Take 2 tablets (120 mg total) by mouth every evening.   latanoprost 0.005 % ophthalmic solution Commonly known as: XALATAN Place 1 drop into both eyes  at bedtime.   losartan 25 MG tablet Commonly known as: COZAAR Take 1 tablet (25 mg total) by mouth daily.   metFORMIN 500 MG tablet Commonly known as: GLUCOPHAGE Take 500 mg by mouth 2 (two) times daily.   metoprolol succinate 25 MG 24 hr tablet Commonly known as: TOPROL-XL Take 1 tablet (25 mg total) by mouth daily. Pt. Needs to make an appt. With Cardiologist in order to receive further refills. Thank You. 1st Attempt.   nitroGLYCERIN 0.4 MG SL tablet Commonly known as: NITROSTAT Place 1 tablet (0.4 mg total) under the tongue every 5 (five) minutes x 3 doses as needed for chest pain.   OXYGEN Inhale 3 L into the lungs at bedtime.   pantoprazole 40 MG tablet Commonly known as: PROTONIX Take 1 tablet (40 mg total) by mouth daily. Take 30-60 min before first meal of the day   spironolactone 25 MG tablet Commonly known as: ALDACTONE Take 0.5 tablets (12.5 mg total) by mouth daily.   tamsulosin 0.4 MG Caps capsule Commonly known as: FLOMAX Take 1 capsule (0.4 mg total) by mouth daily after supper.   torsemide 10 MG tablet Commonly known as: DEMADEX Take 1 tablet (10 mg total) by mouth every other day.  Take 20 mg for weight gain over 3 lbs in 24 hours   Vitamin D (Ergocalciferol) 1.25 MG (50000 UNIT) Caps capsule Commonly known as: DRISDOL Take 1 capsule by mouth once a week.   warfarin 2 MG tablet Commonly known as: COUMADIN Take 2 mg by mouth daily.        Discharge Exam: Filed Weights   01/02/22 0847 01/02/22 0850  Weight: 90.9 kg 88.9 kg   HEENT:  [place]/AT, No thrush, no icterus CV:  IRRR, no rub, no S3, no S4 Lung:  CTA, no wheeze, no rhonchi Abd:  soft/+BS, NT Ext:  No edema, no lymphangitis, no synovitis, no rash   Condition at discharge: stable  The results of significant diagnostics from this hospitalization (including imaging, microbiology, ancillary and laboratory) are listed below for reference.   Imaging Studies: US SCROTUM W/DOPPLER  Result  Date: 01/03/2022 CLINICAL DATA:  Left testicular pain and gross hematuria for the past week. EXAM: SCROTAL ULTRASOUND DOPPLER ULTRASOUND OF THE TESTICLES TECHNIQUE: Complete ultrasound examination of the testicles, epididymis, and other scrotal structures was performed. Color and spectral Doppler ultrasound were also utilized to evaluate blood flow to the testicles. COMPARISON:  Scrotal ultrasound dated November 28, 2021. FINDINGS: Right testicle Measurements: 4.7 x 2.6 x 2.6 cm. No mass or microlithiasis visualized. Left testicle Measurements: 5.3 x 2.9 x 2.5 cm. Ectasia of the rete testis again noted. Unchanged 6 mm cyst. No mass or microlithiasis visualized. Right epididymis:  Normal in size and appearance. Left epididymis: Normal in size and appearance. Unchanged 9 mm cyst. Hydrocele:  None visualized. Varicocele:  None visualized. Pulsed Doppler interrogation of both testes demonstrates normal low resistance arterial and venous waveforms bilaterally. IMPRESSION: 1. No acute abnormality. 2. 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 Electronically Signed   By: Titus Dubin M.D.   On: 01/03/2022 13:07   VAS US CAROTID  Result Date: 12/06/2021 Carotid Arterial Duplex Study Patient Name:  HERBERT MARKEN  Date of Exam:   12/06/2021 Medical Rec #: [id number]      Accession #:    [id number] Date of Birth: 1940/08/01     Patient Gender: M Patient Age:   30 years Exam Location:  [facility] Procedure:      VAS US CAROTID Referring Phys: TODD EARLY --------------------------------------------------------------------------------  Indications:       Carotid artery disease. Comparison Study:  11/02/20: Right 1-39% Left 40-59% (217/49 cm/s) Performing Technologist: Ralene Cork RVT  Examination Guidelines: A complete evaluation includes B-mode imaging, spectral Doppler, color Doppler, and power Doppler as needed of all accessible portions of each vessel. Bilateral testing is considered an integral  part of a complete examination. Limited examinations for reoccurring indications may be performed as noted.  Right Carotid Findings: +----------+--------+--------+--------+------------------+--------+           PSV cm/sEDV cm/sStenosisPlaque DescriptionComments +----------+--------+--------+--------+------------------+--------+ CCA Prox  71      11              calcific                   +----------+--------+--------+--------+------------------+--------+ CCA Mid   112     15              calcific                   +----------+--------+--------+--------+------------------+--------+ CCA Distal126     18              heterogenous               +----------+--------+--------+--------+------------------+--------+  ICA Prox  151     32      1-39%   calcific                   +----------+--------+--------+--------+------------------+--------+ ICA Mid   112     24                                         +----------+--------+--------+--------+------------------+--------+ ICA Distal60      17                                         +----------+--------+--------+--------+------------------+--------+ ECA       170     0                                          +----------+--------+--------+--------+------------------+--------+ +----------+--------+-------+----------------+-------------------+           PSV cm/sEDV cmsDescribe        Arm Pressure (mmHG) +----------+--------+-------+----------------+-------------------+ [id number]            Multiphasic, WNL                    +----------+--------+-------+----------------+-------------------+ +---------+--------+--+--------+--+---------+ VertebralPSV cm/s61EDV cm/s11Antegrade +---------+--------+--+--------+--+---------+  Left Carotid Findings: +----------+--------+--------+--------+-------------------------+--------+           PSV cm/sEDV cm/sStenosisPlaque Description       Comments  +----------+--------+--------+--------+-------------------------+--------+ CCA Prox  76      13              calcific                          +----------+--------+--------+--------+-------------------------+--------+ CCA Mid   138     12              calcific                          +----------+--------+--------+--------+-------------------------+--------+ CCA Distal140     21              calcific                          +----------+--------+--------+--------+-------------------------+--------+ ICA Prox  193     48      40-59%  heterogenous and calcific         +----------+--------+--------+--------+-------------------------+--------+ ICA Mid   122     24                                                +----------+--------+--------+--------+-------------------------+--------+ ICA Distal70      21                                                +----------+--------+--------+--------+-------------------------+--------+ ECA       166     0  heterogenous and calcific         +----------+--------+--------+--------+-------------------------+--------+ +----------+--------+--------+----------------+-------------------+           PSV cm/sEDV cm/sDescribe        Arm Pressure (mmHG) +----------+--------+--------+----------------+-------------------+ [id number]             Multiphasic, WNL                    +----------+--------+--------+----------------+-------------------+ +---------+--------+--+--------+--+---------+ VertebralPSV cm/s45EDV cm/s11Antegrade +---------+--------+--+--------+--+---------+   Summary: Right Carotid: Velocities in the right ICA are consistent with a 1-39% stenosis. Left Carotid: Velocities in the left ICA are consistent with a 40-59% stenosis. Vertebrals:  Bilateral vertebral arteries demonstrate antegrade flow. Subclavians: Normal flow hemodynamics were seen in bilateral subclavian              arteries. *See  table(s) above for measurements and observations.  Electronically signed by Curt Jews MD on 12/06/2021 at 4:10:37 PM.    Final     Microbiology: Results for orders placed or performed during the hospital encounter of 01/02/22  Urine Culture     Status: None   Collection Time: 01/02/22  8:59 AM   Specimen: Urine, Catheterized  Result Value Ref Range Status   Specimen Description   Final    URINE, CATHETERIZED Performed at [facility], [address] [place], [place] [address]    Special Requests   Final    NONE Performed at [facility], [address], [place],  [address]    Culture   Final    NO GROWTH Performed at [facility], [place] [address] [place],  [address]    Report Status 01/04/2022 FINAL  Final    Labs: CBC: Recent Labs  Lab 01/02/22 0916 01/03/22 0324 01/04/22 0251  WBC 6.2 10.0 11.8*  NEUTROABS 4.2  --   --   HGB 12.3* 11.0* 10.6*  HCT 38.4* 33.9* 33.2*  MCV 88.3 88.5 90.0  PLT 158 143* 865   Basic Metabolic Panel: Recent Labs  Lab 01/02/22 0916 01/03/22 0324  NA 140 138  K 3.9 3.6  CL 104 103  CO2 26 26  GLUCOSE 124* 145*  BUN 27* 21  CREATININE 0.98 0.84  CALCIUM 9.0 8.9   Liver Function Tests: No results for input(s): "AST", "ALT", "ALKPHOS", "BILITOT", "PROT", "ALBUMIN" in the last 168 hours. CBG: Recent Labs  Lab 01/03/22 0724 01/03/22 1110 01/03/22 1616 01/04/22 0713 01/04/22 1108  GLUCAP 116* 181* 157* 148* 155*    Discharge time spent: greater than 30 minutes.  Signed: Orson Eva, MD [facility] 01/04/2022

## 2022-01-04 NOTE — Progress Notes (Signed)
Sherman notification completed Your notification ID is: [id number]

## 2022-01-08 DIAGNOSIS — R310 Gross hematuria: Secondary | ICD-10-CM | POA: Diagnosis not present

## 2022-01-08 DIAGNOSIS — E860 Dehydration: Secondary | ICD-10-CM | POA: Diagnosis not present

## 2022-01-08 DIAGNOSIS — N401 Enlarged prostate with lower urinary tract symptoms: Secondary | ICD-10-CM | POA: Diagnosis not present

## 2022-01-08 DIAGNOSIS — Z6830 Body mass index (BMI) 30.0-30.9, adult: Secondary | ICD-10-CM | POA: Diagnosis not present

## 2022-01-08 DIAGNOSIS — I509 Heart failure, unspecified: Secondary | ICD-10-CM | POA: Diagnosis not present

## 2022-01-08 DIAGNOSIS — I129 Hypertensive chronic kidney disease with stage 1 through stage 4 chronic kidney disease, or unspecified chronic kidney disease: Secondary | ICD-10-CM | POA: Diagnosis not present

## 2022-01-08 DIAGNOSIS — Z7901 Long term (current) use of anticoagulants: Secondary | ICD-10-CM | POA: Diagnosis not present

## 2022-01-10 ENCOUNTER — Telehealth: Payer: Self-pay

## 2022-01-10 NOTE — Telephone Encounter (Signed)
Informed pt per Dr. [name] patient will need to wait until  his scheduled 10/20 appointment to possible have catheter removed. Patient voiced understanding and states that the bleeding has cleared up.

## 2022-01-10 NOTE — Telephone Encounter (Signed)
patient has cath and states he is still bleeding from the cath. patient wants to know if he need to come in for a voiding trial prior to his cysto. please call patient.

## 2022-01-11 ENCOUNTER — Ambulatory Visit: Payer: Medicare HMO | Admitting: Physician Assistant

## 2022-01-16 ENCOUNTER — Other Ambulatory Visit: Payer: Self-pay

## 2022-01-16 ENCOUNTER — Encounter (HOSPITAL_COMMUNITY): Payer: Self-pay

## 2022-01-16 DIAGNOSIS — E119 Type 2 diabetes mellitus without complications: Secondary | ICD-10-CM | POA: Diagnosis not present

## 2022-01-16 DIAGNOSIS — I2510 Atherosclerotic heart disease of native coronary artery without angina pectoris: Secondary | ICD-10-CM | POA: Diagnosis not present

## 2022-01-16 DIAGNOSIS — I509 Heart failure, unspecified: Secondary | ICD-10-CM | POA: Insufficient documentation

## 2022-01-16 DIAGNOSIS — Z7901 Long term (current) use of anticoagulants: Secondary | ICD-10-CM | POA: Insufficient documentation

## 2022-01-16 DIAGNOSIS — Z87891 Personal history of nicotine dependence: Secondary | ICD-10-CM | POA: Diagnosis not present

## 2022-01-16 DIAGNOSIS — Z8546 Personal history of malignant neoplasm of prostate: Secondary | ICD-10-CM | POA: Insufficient documentation

## 2022-01-16 DIAGNOSIS — Z8551 Personal history of malignant neoplasm of bladder: Secondary | ICD-10-CM | POA: Insufficient documentation

## 2022-01-16 DIAGNOSIS — I110 Hypertensive heart disease with heart failure: Secondary | ICD-10-CM | POA: Insufficient documentation

## 2022-01-16 DIAGNOSIS — R319 Hematuria, unspecified: Secondary | ICD-10-CM | POA: Diagnosis present

## 2022-01-16 DIAGNOSIS — Z951 Presence of aortocoronary bypass graft: Secondary | ICD-10-CM | POA: Insufficient documentation

## 2022-01-16 NOTE — ED Triage Notes (Signed)
Pt reports bleeding from foley catheter. Pt states that it is blood clots on the side. Urine catheter draining in triage. Pt reports bloody underwear. Started today.

## 2022-01-17 ENCOUNTER — Emergency Department (HOSPITAL_COMMUNITY)
Admission: EM | Admit: 2022-01-17 | Discharge: 2022-01-17 | Disposition: A | Discharge status: Home / Self Care | Payer: No Typology Code available for payment source | Attending: Emergency Medicine | Admitting: Emergency Medicine

## 2022-01-17 ENCOUNTER — Ambulatory Visit (INDEPENDENT_AMBULATORY_CARE_PROVIDER_SITE_OTHER): Payer: No Typology Code available for payment source | Admitting: Urology

## 2022-01-17 DIAGNOSIS — Z8551 Personal history of malignant neoplasm of bladder: Secondary | ICD-10-CM | POA: Diagnosis not present

## 2022-01-17 DIAGNOSIS — R310 Gross hematuria: Secondary | ICD-10-CM

## 2022-01-17 DIAGNOSIS — I110 Hypertensive heart disease with heart failure: Secondary | ICD-10-CM | POA: Diagnosis not present

## 2022-01-17 DIAGNOSIS — Z87891 Personal history of nicotine dependence: Secondary | ICD-10-CM | POA: Diagnosis not present

## 2022-01-17 DIAGNOSIS — E119 Type 2 diabetes mellitus without complications: Secondary | ICD-10-CM | POA: Diagnosis not present

## 2022-01-17 DIAGNOSIS — Z7901 Long term (current) use of anticoagulants: Secondary | ICD-10-CM

## 2022-01-17 DIAGNOSIS — R319 Hematuria, unspecified: Secondary | ICD-10-CM | POA: Diagnosis present

## 2022-01-17 DIAGNOSIS — Z8546 Personal history of malignant neoplasm of prostate: Secondary | ICD-10-CM | POA: Diagnosis not present

## 2022-01-17 DIAGNOSIS — Z951 Presence of aortocoronary bypass graft: Secondary | ICD-10-CM | POA: Diagnosis not present

## 2022-01-17 DIAGNOSIS — I509 Heart failure, unspecified: Secondary | ICD-10-CM | POA: Diagnosis not present

## 2022-01-17 DIAGNOSIS — I2510 Atherosclerotic heart disease of native coronary artery without angina pectoris: Secondary | ICD-10-CM | POA: Diagnosis not present

## 2022-01-17 LAB — COMPREHENSIVE METABOLIC PANEL
ALT: 12 U/L (ref 0–44)
AST: 25 U/L (ref 15–41)
Albumin: 3.7 g/dL (ref 3.5–5.0)
Alkaline Phosphatase: 87 U/L (ref 38–126)
Anion gap: 10 (ref 5–15)
BUN: 22 mg/dL (ref 8–23)
CO2: 24 mmol/L (ref 22–32)
Calcium: 8.7 mg/dL — ABNORMAL LOW (ref 8.9–10.3)
Chloride: 106 mmol/L (ref 98–111)
Creatinine, Ser: 0.81 mg/dL (ref 0.61–1.24)
GFR, Estimated: >60 mL/min (ref >60)
Glucose, Bld: 111 mg/dL — ABNORMAL HIGH (ref 70–99)
Potassium: 3.5 mmol/L (ref 3.5–5.1)
Sodium: 140 mmol/L (ref 135–145)
Total Bilirubin: 0.9 mg/dL (ref 0.3–1.2)
Total Protein: 7.5 g/dL (ref 6.5–8.1)

## 2022-01-17 LAB — CBC WITH DIFFERENTIAL/PLATELET
Abs Immature Granulocytes: 0.03 10*3/uL (ref 0.00–0.07)
Basophils Absolute: 0.1 10*3/uL (ref 0.0–0.1)
Basophils Relative: 1 %
Eosinophils Absolute: 0.3 10*3/uL (ref 0.0–0.5)
Eosinophils Relative: 3 %
HCT: 35.6 % — ABNORMAL LOW (ref 39.0–52.0)
Hemoglobin: 11.3 g/dL — ABNORMAL LOW (ref 13.0–17.0)
Immature Granulocytes: 0 %
Lymphocytes Relative: 20 %
Lymphs Abs: 1.8 10*3/uL (ref 0.7–4.0)
MCH: 28.3 pg (ref 26.0–34.0)
MCHC: 31.7 g/dL (ref 30.0–36.0)
MCV: 89.2 fL (ref 80.0–100.0)
Monocytes Absolute: 0.7 10*3/uL (ref 0.1–1.0)
Monocytes Relative: 8 %
Neutro Abs: 6 10*3/uL (ref 1.7–7.7)
Neutrophils Relative %: 68 %
Platelets: 202 10*3/uL (ref 150–400)
RBC: 3.99 MIL/uL — ABNORMAL LOW (ref 4.22–5.81)
RDW: 15.9 % — ABNORMAL HIGH (ref 11.5–15.5)
WBC: 8.8 10*3/uL (ref 4.0–10.5)
nRBC: 0 % (ref 0.0–0.2)

## 2022-01-17 LAB — CBC
HCT: 37 % — ABNORMAL LOW (ref 39.0–52.0)
Hemoglobin: 11.8 g/dL — ABNORMAL LOW (ref 13.0–17.0)
MCH: 28.5 pg (ref 26.0–34.0)
MCHC: 31.9 g/dL (ref 30.0–36.0)
MCV: 89.4 fL (ref 80.0–100.0)
Platelets: 204 10*3/uL (ref 150–400)
RBC: 4.14 MIL/uL — ABNORMAL LOW (ref 4.22–5.81)
RDW: 16 % — ABNORMAL HIGH (ref 11.5–15.5)
WBC: 9.6 10*3/uL (ref 4.0–10.5)
nRBC: 0 % (ref 0.0–0.2)

## 2022-01-17 LAB — PROTIME-INR
INR: 2.8 — ABNORMAL HIGH (ref 0.8–1.2)
Prothrombin Time: 29 seconds — ABNORMAL HIGH (ref 11.4–15.2)

## 2022-01-17 LAB — PSA: Prostatic Specific Antigen: 0.06 ng/mL (ref 0.00–4.00)

## 2022-01-17 MED ORDER — HYDROCODONE-ACETAMINOPHEN 5-325 MG PO TABS
1.0000 | ORAL_TABLET | Freq: Once | ORAL | Status: AC
  Administered 2022-01-17: 1 via ORAL
  Filled 2022-01-17: qty 1

## 2022-01-17 MED ORDER — DUTASTERIDE 0.5 MG PO CAPS: 0.5000 mg | ORAL_CAPSULE | Freq: Every day | ORAL | 11 refills | Status: DC

## 2022-01-17 NOTE — Consult Note (Signed)
Reason for Consult:Prostate Cancer, Recurrent Gross Hematuria / Radiation Cystitis,   Referring Physician: Gerlene Fee MD  Troy [name] is an 81 y.o. male.   HPI:   1 - Prostate Cancer - s/p brachytherapy + external beam radiation 07/03/2003 for Gleason 7 disease by R. Davis. PSA 07/02/09 0.2. PSA 07-02-21 pending.  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 CT 07/2021 w/o upper tract masses. More recently gross blood from area of urehtrahl false pass.   PMH sig for CAD/MI/CHF (recurrent hospitalizations, coumadin), DM2 (A1c 6-7). Wife died 02-Jul-2021, has been a rough year.   Today "Troy [name]" is seen in consultation for peri-catheter bleeding, urine itself is clear. He remians on couamdin with INR 2.8, Hgb 11.8.   Past Medical History:  Diagnosis Date   AAA (abdominal aortic aneurysm) ([place])    Followed by Dr. Sherren Mocha Early   Arthritis    CAD (coronary artery disease)    a. CABG 07-03-1998.   Cancer [facility])    Prostate:  Radiation Tx   Carotid artery disease ([place])    Chest pain    precordial. mild chronic .[name]... nonischemic   CHF (congestive heart failure) (HCC)    Chronic edema    Coronary artery disease    a. Nuclear, January, 2008, no ischemia b. Cath 08/2012- 1/4 patent grafts, RCA CTO, no flow-limiting disease, medically managed   Diabetes mellitus without complication ([place])    Dizziness 02/2011   Dyslipidemia    Fall    GERD (gastroesophageal reflux disease)    TAKES TUMS & ROLAIDS AS NEEDED   Hx of CABG 07-03-98   Hypertension    Myocardial infarction ([place]) 07/02/97   Neck pain 02/2011   Pneumonia    Pulmonary hypertension ([place])    Renal artery stenosis (HCC)    50-70%   S/P femoropopliteal bypass surgery    Dr. Donnetta Hutching   Sinus bradycardia    Asymptomatic    Past Surgical History:   Procedure Laterality Date   CARDIAC CATHETERIZATION  09/26/2012   1/4 patent bypass (occluded SVG-PDA, SVG-OM, LIMA-LAD), SVG-diagonal patent and fills the diagonal and LAD, distal RCA occlusion with left to right collateralization, patent circumflex, LAD with no flow-limiting disease and antegrade flow competitively from SVG-diagonal; EF 60-65%   COLONOSCOPY  11/30/2009   CVE:[id number]. next TCS 11/2019   COLONOSCOPY WITH PROPOFOL N/A 12/18/2019   Procedure: COLONOSCOPY WITH PROPOFOL;  Surgeon: Harvel Quale, MD;  Location: [facility] ENDO SU[facility];  Service: Gastroenterology;  Laterality: N/A;  1030   CORONARY ARTERY BYPASS GRAFT  2000   CYSTOSCOPY N/A 08/31/2021   Procedure: CYSTOSCOPY;  Surgeon: Cleon Gustin, MD;  Location: [facility] ORS;  Service: Urology;  Laterality: N/A;  pt knows to arrive at 7:00   ESOPHAGOGASTRODUODENOSCOPY N/A 08/12/2013   IDP:[id number] peptic stricture with erosive refluxesophagitis - status post Maloney dilation. Hiatal hernia. Abnormalgastric mucosa. Deformity of the pyloric channel suggestive ofprior peptic ulcer disease. Duodenal bulbar diverticulum Statuspost gastric biopsy. h.pylori   LEFT HEART CATHETERIZATION WITH CORONARY/GRAFT ANGIOGRAM N/A 09/26/2012   Procedure: LEFT HEART CATHETERIZATION WITH Beatrix Fetters;  Surgeon: Burnell Blanks, MD;  Location: [facility] CATH LAB;  Service: Cardiovascular;  Laterality: N/A;   MALONEY DILATION N/A 08/12/2013   Procedure: Venia Minks DILATION;  Surgeon: Daneil Dolin, MD;  Location: [facility] ENDO SU[facility];  Service: Endoscopy;  Laterality: N/A;   POLYPECTOMY  12/18/2019   Procedure: POLYPECTOMY;  Surgeon: Harvel Quale, MD;  Location: [facility];  Service: Gastroenterology;;  ascending colon polyp    PR VEIN BYPASS GRAFT,AORTO-FEM-POP Right 07/19/1999   PR VEIN BYPASS GRAFT,AORTO-FEM-POP Left 05/02/2006   RIGHT HEART CATH AND CORONARY/GRAFT ANGIOGRAPHY N/A 04/11/2020   Procedure: RIGHT HEART CATH AND  CORONARY/GRAFT ANGIOGRAPHY;  Surgeon: Larey Dresser, MD;  Location: [place] CV LAB;  Service: Cardiovascular;  Laterality: N/A;   SAVORY DILATION N/A 08/12/2013   Procedure: SAVORY DILATION;  Surgeon: Daneil Dolin, MD;  Location: [facility]O [facility];  Service: Endoscopy;  Laterality: N/A;   TRANSURETHRAL RESECTION OF BLADDER TUMOR N/A 08/31/2021   Procedure: TRANSURETHRAL RESECTION OF BLADDER TUMOR (TURBT);  Surgeon: Cleon Gustin, MD;  Location: [facility] ORS;  Service: Urology;  Laterality: N/A;   WRIST SURGERY     cyst removal    Family History  Problem Relation Age of Onset   Deep vein thrombosis Father    Lung cancer Sister    Diabetes Sister    Heart disease Sister        After age 74   Hyperlipidemia Sister    Hypertension Sister    Lung cancer Sister    Breast cancer Sister    Hypertension Mother    Diabetes Sister    Coronary artery disease Other        family hx of   Cancer Brother        "crab cancer"   Heart disease Brother    Heart attack Brother    Heart attack Daughter    Colon cancer Neg Hx    Stroke Neg Hx     Social History:  reports that he quit smoking about 3 years ago. His smoking use included cigarettes. He has a 140.00 pack-year smoking history. He has never used smokeless tobacco. He reports current alcohol use. He reports that he does not use drugs.  Allergies:  Allergies  Allergen Reactions   Niacin Itching and Rash    Burning sensation   Contrast Media [Iodinated Contrast Media] Nausea And Vomiting   Iodine-131 Nausea And Vomiting   Nsaids Other (See Comments)    Taking Coumadin    Medications: I have reviewed the patient's current medications.  Results for orders placed or performed during the hospital encounter of 01/17/22 (from the past 48 hour(s))  CBC     Status: Abnormal   Collection Time: 01/17/22  2:27 AM  Result Value Ref Range   WBC 9.6 4.0 - 10.5 K/uL   RBC 4.14 (L) 4.22 - 5.81 MIL/uL   Hemoglobin 11.8 (L) 13.0 - 17.0 g/dL    HCT 37.0 (L) 39.0 - 52.0 %   MCV 89.4 80.0 - 100.0 fL   MCH 28.5 26.0 - 34.0 pg   MCHC 31.9 30.0 - 36.0 g/dL   RDW 16.0 (H) 11.5 - 15.5 %   Platelets 204 150 - 400 K/uL   nRBC 0.0 0.0 - 0.2 %    Comment: Performed at [facility], [address] [place], [place] [address]  Comprehensive metabolic panel     Status: Abnormal   Collection Time: 01/17/22  2:27 AM  Result Value Ref Range   Sodium 140 135 - 145 mmol/L   Potassium 3.5 3.5 - 5.1 mmol/L   Chloride 106 98 - 111 mmol/L   CO2 24 22 - 32 mmol/L   Glucose, Bld 111 (H) 70 - 99 mg/dL  Comment: Glucose reference range applies only to samples taken after fasting for at least 8 hours.   BUN 22 8 - 23 mg/dL   Creatinine, Ser 0.81 0.61 - 1.24 mg/dL   Calcium 8.7 (L) 8.9 - 10.3 mg/dL   Total Protein 7.5 6.5 - 8.1 g/dL   Albumin 3.7 3.5 - 5.0 g/dL   AST 25 15 - 41 U/L   ALT 12 0 - 44 U/L   Alkaline Phosphatase 87 38 - 126 U/L   Total Bilirubin 0.9 0.3 - 1.2 mg/dL   GFR, Estimated >60 >60 mL/min    Comment: (NOTE) Calculated using the CKD-EPI Creatinine Equation (2021)    Anion gap 10 5 - 15    Comment: Performed at [facility], [address], [place], [place] [address]  Protime-INR     Status: Abnormal   Collection Time: 01/17/22  2:27 AM  Result Value Ref Range   Prothrombin Time 29.0 (H) 11.4 - 15.2 seconds   INR 2.8 (H) 0.8 - 1.2    Comment: (NOTE) INR goal varies based on device and disease states. Performed at [facility], [address] [place], [place] [address]     No results found.  Review of Systems  Constitutional:  Negative for chills and unexpected weight change.  Genitourinary:  Positive for hematuria.  All other systems reviewed and are negative.  Blood pressure (!) 163/74, pulse (!) 56, temperature 98.1 F (36.7 C), resp. rate 16, height '5\' 10"'$  (1.778 m), weight 88.5 kg, SpO2 93 %. Physical Exam Vitals reviewed.  Constitutional:      Comments: Pleasant, tired, in ER.   HENT:     Head:  Normocephalic.     Mouth/Throat:     Mouth: Mucous membranes are moist.  Eyes:     Pupils: Pupils are equal, round, and reactive to light.  Cardiovascular:     Rate and Rhythm: Normal rate.  Pulmonary:     Comments: Non-labored breathign on RA Abdominal:     General: Abdomen is flat.     Comments: Mild truncal obesity. No SP distnesion.   Genitourinary:    Comments: In situ 3 way catheter with irrigation port plugged and clear urine in bag, some peri-urethral bleeding that is mild. 2 leg straps placed to allow for gentle traction. Pt expalined purpose of traction to help tamponade.  Neurological:     General: No focal deficit present.     Mental Status: He is alert.  Psychiatric:        Mood and Affect: Mood normal.     Assessment/Plan:  Hgb stable. Current peri-catheter bleeding is from prior recent urethral trauma and best treated by leaving large bore catheter in siute on gentle traction and holdign coumadin. I feel OK for DC. Precuations discussed. He is facile with catheter management and has FU later this week. Greatly appreciate all ER staff management.   Alexis Frock 01/17/2022, 6:53 AM

## 2022-01-17 NOTE — Discharge Instructions (Signed)
Keep Foley catheter in place until your follow-up with Dr. [name] on Friday.  Hold your Coumadin until he tells you otherwise to take it.  Return to the ED with recurrent bleeding, not able to urinate or any other concerns.

## 2022-01-17 NOTE — ED Notes (Signed)
Foley Catheter Irrigated using total of 120cc NS. Clear urine returned. No clots noted in urine. Clots noted to urethral meatus prior to irrigation. Pt tolerated well. Dr Sedonia Small notified.

## 2022-01-17 NOTE — Progress Notes (Signed)
Patient called office today to state he was bleeding around his catheter site.  Patient in office and has catheter anchored to left leg with two anchors. Bleeding and clots noted around catheter site and Anchors removed and deflated 30cc of saline from balloon. Catheter repositioned. New achor placed to right leg.  Bladder irrigated with sterile saline- clear urine return noted.  Dr. [name] in room to assess catheter and speak with patient.

## 2022-01-17 NOTE — ED Provider Notes (Addendum)
Patient transferred from [facility] with hematuria on Coumadin.  Has catheter in place. Was sent for urology evaluation.  Hemodynamics are stable.  On arrival patient has clear yellow urine in his catheter bag.  There is a small amount of bleeding around the urethra and glans area.  Discussed with Dr. Alinda Money of urology.  He reviewed patient's chart.  He feels as long as the catheter is draining adequately with no blood in the catheter bag, patient to follow-up with his urologist Dr. [name] on Friday as scheduled.  Recommend holding Coumadin until then.  Patient likely having prostatic bleeding should tamponade off from catheter itself.   No indication for hospitalization or continuous bladder irrigation today.  Vitals remained stable.  Repeat hemoglobin stable at 11.3.  Dr. Tresa Moore has seen patient.  He agrees likely prostatic bleeding.  He has arranged traction of the Foley catheter to assist with tamponade.  Agrees with holding Coumadin and follow-up with Dr. [name] on Friday as scheduled.  BP (!) 194/76   Pulse 61   Temp 97.8 F (36.6 C) (Oral)   Resp 14   Ht '5\' 10"'$  (1.778 m)   Wt 88.5 kg   SpO2 97%   BMI 27.98 kg/m     Ezequiel Essex, MD 01/17/22 [id number]    Ezequiel Essex, MD 01/17/22 [phone]

## 2022-01-17 NOTE — ED Provider Notes (Signed)
[facility] Emergency Department Provider Note MRN:  [id number]  Arrival date & time: 01/17/22     Chief Complaint   Hematuria History of Present Illness   Troy Troy [name] is a 81 y.o. year-old male with a history of prostate Troy [name], Troy Troy [name] presenting to the ED with chief complaint of hematuria.  Persistent blood in the urine.  Patient takes Coumadin.  More recently having blood and clots fill his depends, blood in urine seems to be exiting the urethra around the Foley catheter.  Denies pain, no fever.  Review of Systems  A thorough review of systems was obtained and all systems are negative except as noted in the HPI and PMH.   Patient's Health History    Past Medical History:  Diagnosis Date   AAA (abdominal aortic aneurysm) ([place])    Followed by Dr. Sherren Mocha Early   Arthritis    CAD (coronary artery disease)    a. CABG 2000.   [name])    Prostate:  Radiation Tx   Carotid artery disease ([place])    Chest pain    precordial. mild chronic .[name]... nonischemic   CHF (congestive heart failure) (HCC)    Chronic edema    Coronary artery disease    a. Nuclear, January, 2008, no ischemia b. Cath 08/2012- 1/4 patent grafts, RCA CTO, no flow-limiting disease, medically managed   Diabetes mellitus without complication ([place])    Dizziness 02/2011   Dyslipidemia    Fall    GERD (gastroesophageal reflux disease)    TAKES TUMS & ROLAIDS AS NEEDED   Hx of CABG 2000   Hypertension    Myocardial infarction ([place]) 1999   Neck pain 02/2011   Pneumonia    Pulmonary hypertension ([place])    Renal artery stenosis (HCC)    50-70%   S/P femoropopliteal bypass surgery    Dr. Donnetta Hutching   Sinus bradycardia    Asymptomatic    Past Surgical History:  Procedure Laterality Date   CARDIAC CATHETERIZATION  09/26/2012   1/4 patent bypass (occluded SVG-PDA, SVG-OM, LIMA-LAD), SVG-diagonal patent and fills the diagonal and LAD, distal RCA occlusion with left to right  collateralization, patent circumflex, LAD with no flow-limiting disease and antegrade flow competitively from SVG-diagonal; EF 60-65%   COLONOSCOPY  11/30/2009   CBJ:[id number]. next TCS 11/2019   COLONOSCOPY WITH PROPOFOL N/A 12/18/2019   Procedure: COLONOSCOPY WITH PROPOFOL;  Surgeon: Harvel Quale, MD;  Location[facility];  Service: Gastroenterology;  Laterality: N/A;  1030   CORONARY ARTERY BYPASS GRAFT  2000   CYSTOSCOPY N/A 08/31/2021   Procedure: CYSTOSCOPY;  Surgeon: Cleon Gustin, MD;  Location: [facility] ORS;  Service: Urology;  Laterality: N/A;  pt knows to arrive at 7:00   ESOPHAGOGASTRODUODENOSCOPY N/A 08/12/2013   XTG:[id number] peptic stricture with erosive refluxesophagitis - status post Maloney dilation. Hiatal hernia. Abnormalgastric mucosa. Deformity of the pyloric channel suggestive ofprior peptic ulcer disease. Duodenal bulbar diverticulum Statuspost gastric biopsy. h.pylori   LEFT HEART CATHETERIZATION WITH CORONARY/GRAFT ANGIOGRAM N/A 09/26/2012   Procedure: LEFT HEART CATHETERIZATION WITH Beatrix Fetters;  Surgeon: Burnell Blanks, MD;  Location: [facility] CATH LAB;  Service: Cardiovascular;  Laterality: N/A;   MALONEY DILATION N/A 08/12/2013   Procedure: Venia Minks DILATION;  Surgeon: Daneil Dolin, MD;  Location: [facility];  Service: Endoscopy;  Laterality: N/A;   POLYPECTOMY  12/18/2019   Procedure: POLYPECTOMY;  Surgeon: Harvel Quale, MD;  Location: [facility];  Service: Gastroenterology;;  ascending colon  polyp    PR VEIN BYPASS GRAFT,AORTO-FEM-POP Right 07/19/1999   PR VEIN BYPASS GRAFT,AORTO-FEM-POP Left 05/02/2006   RIGHT HEART CATH AND CORONARY/GRAFT ANGIOGRAPHY N/A 04/11/2020   Procedure: RIGHT HEART CATH AND CORONARY/GRAFT ANGIOGRAPHY;  Surgeon: Larey Dresser, MD;  Location: [place] CV LAB;  Service: Cardiovascular;  Laterality: N/A;   SAVORY DILATION N/A 08/12/2013   Procedure: SAVORY DILATION;  Surgeon: Daneil Dolin, MD;  Location: [facility];  Service: Endoscopy;  Laterality: N/A;   TRANSURETHRAL RESECTION OF Troy TUMOR N/A 08/31/2021   Procedure: TRANSURETHRAL RESECTION OF Troy TUMOR (TURBT);  Surgeon: Cleon Gustin, MD;  Location: [facility] ORS;  Service: Urology;  Laterality: N/A;   WRIST SURGERY     cyst removal    Family History  Problem Relation Age of Onset   Deep vein thrombosis Father    Lung Troy [name] Sister    Diabetes Sister    Heart disease Sister        After age 69   Hyperlipidemia Sister    Hypertension Sister    Lung Troy [name] Sister    Breast Troy [name] Sister    Hypertension Mother    Diabetes Sister    Coronary artery disease Other        family hx of   Troy [name] Brother        "crab Troy [name]"   Heart disease Brother    Heart attack Brother    Heart attack Daughter    Colon Troy [name] Neg Hx    Stroke Neg Hx     Social History   Socioeconomic History   Marital status: Widowed    Spouse name: Not on file   Number of children: Not on file   Years of education: Not on file   Highest education level: Not on file  Occupational History   Not on file  Tobacco Use   Smoking status: Former    Packs/day: 2.00    Years: 70.00    Total pack years: 140.00    Types: Cigarettes    Quit date: 04/21/2018    Years since quitting: 3.7   Smokeless tobacco: Never  Vaping Use   Vaping Use: Never used  Substance and Sexual Activity   Alcohol use: Yes    Alcohol/week: 0.0 - 2.0 standard drinks of alcohol    Comment: OCCASIONAL   Drug use: Never   Sexual activity: Not on file  Other Topics Concern   Not on file  Social History Narrative   Not on file   Social Determinants of Health   Financial Resource Strain: Not on file  Food Insecurity: No Food Insecurity (01/02/2022)   Hunger Vital Sign    Worried About Running Out of Food in the Last Year: Never true    Ran Out of Food in the Last Year: Never true  Transportation Needs: No Transportation Needs (01/02/2022)   PRAPARE  - Hydrologist (Medical): No    Lack of Transportation (Non-Medical): No  Physical Activity: Not on file  Stress: Not on file  Social Connections: Not on file  Intimate Partner Violence: Not At Risk (01/02/2022)   Humiliation, Afraid, Rape, and Kick questionnaire    Fear of Current or Ex-Partner: No    Emotionally Abused: No    Physically Abused: No    Sexually Abused: No     Physical Exam   Vitals:   01/17/22 0246 01/17/22 0424  BP:  (!) 162/69  Pulse:  63  Resp:  18  Temp: 98 F (36.7 C)   SpO2:  93%    CONSTITUTIONAL: Well-appearing, NAD NEURO/PSYCH:  Alert and oriented x 3, no focal deficits EYES:  eyes equal and reactive ENT/NECK:  no LAD, no JVD CARDIO: Regular rate, well-perfused, normal S1 and S2 PULM:  CTAB no wheezing or rhonchi GI/GU:  non-distended, non-tender MSK/SPINE:  No gross deformities, no edema SKIN:  no rash, atraumatic   *Additional and/or pertinent findings included in MDM below  Diagnostic and Interventional Summary    EKG Interpretation  Date/Time:    Ventricular Rate:    PR Interval:    QRS Duration:   QT Interval:    QTC Calculation:   R Axis:     Text Interpretation:         Labs Reviewed  CBC - Abnormal; Notable for the following components:      Result Value   RBC 4.14 (*)    Hemoglobin 11.8 (*)    HCT 37.0 (*)    RDW 16.0 (*)    All other components within normal limits  COMPREHENSIVE METABOLIC PANEL - Abnormal; Notable for the following components:   Glucose, Bld 111 (*)    Calcium 8.7 (*)    All other components within normal limits  PROTIME-INR - Abnormal; Notable for the following components:   Prothrombin Time 29.0 (*)    INR 2.8 (*)    All other components within normal limits  URINE CULTURE    No orders to display    Medications - No data to display   Procedures  /  Critical Care Procedures  ED Course and Medical Decision Making  Initial Impression and Ddx Suspect Foley  catheter is clogged with clot.  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  Admission earlier this month for continuous Troy irrigation in the setting of bleeding from the Troy.  Will evaluate with labs to check for worsening anemia, abnormal PT, will attempt to irrigate the Troy, may need to consult Dr. [name] of urology.  Past medical/surgical history that increases complexity of ED encounter: Prostate Troy [name], Troy Troy [name]  Interpretation of Diagnostics I personally reviewed the laboratory assessment and my interpretation is as follows: No significant blood count or electrolyte disturbance    Patient Reassessment and Ultimate Disposition/Management     Irrigation seemed to go well, Foley is now draining fairly clear liquids/urine.  The blood and clots surrounding the glans and foreskin were cleaned and removed.  He was rechecked a few minutes later and he has had reaccumulation of blood and clots in this area, I see no signs of abrasion or source of external bleeding, clot seems to be clearly coming out of the urethra.  Case discussed with Dr. Bess Harvest of urology, suspicious for bleeding from the prostate, recommending ED to ED transport to [facility] for evaluation by urology, anticipating admission by the urology or hospitalist service.  Accepting ED provider is Dr. Nicholes Stairs at [facility].  Plan at this time is to hold the Coumadin and try to set up some mild traction on the Foley catheter.  Patient management required discussion with the following services or consulting groups:  Urology  Complexity of Problems Addressed Acute illness or injury that poses threat of life of bodily function  Additional Data Reviewed and Analyzed Further history obtained from: Recent discharge summary and Prior labs/imaging results  Additional Factors Impacting ED Encounter Risk Consideration of hospitalization  Barth Kirks.  Sedonia Small, [place] [email or web address]  Final Clinical Impressions(s) / ED Diagnoses     ICD-10-CM   1. Hematuria, unspecified type  R31.9     2. Anticoagulated  Z79.01       ED Discharge Orders     None        Discharge Instructions Discussed with and Provided to Patient:   Discharge Instructions   None      Maudie Flakes, MD 01/17/22 [phone]

## 2022-01-19 ENCOUNTER — Other Ambulatory Visit: Payer: Medicare HMO | Admitting: Urology

## 2022-01-19 LAB — URINE CULTURE: Culture: 80000 — AB

## 2022-01-20 ENCOUNTER — Telehealth (HOSPITAL_BASED_OUTPATIENT_CLINIC_OR_DEPARTMENT_OTHER): Payer: Self-pay

## 2022-01-20 NOTE — Telephone Encounter (Signed)
Post ED Visit - Positive Culture Follow-up: Unsuccessful Patient Follow-up  Culture assessed and recommendations reviewed by:  '[]'$  Elenor Quinones, Pharm.D. '[]'$  Heide Guile, Pharm.D., BCPS AQ-ID '[]'$  Parks Neptune, Pharm.D., BCPS '[]'$  Alycia Rossetti, Pharm.D., BCPS '[]'$  [place], Pharm.D., BCPS, AAHIVP '[]'$  Legrand Como, Pharm.D., BCPS, AAHIVP '[]'$  Wynell Balloon, PharmD '[]'$  Vincenza Hews, PharmD, BCPS  Positive urine culture  '[x]'$  Patient discharged without antimicrobial prescription and treatment is now indicated '[]'$  Organism is resistant to prescribed ED discharge antimicrobial '[]'$  Patient with positive blood cultures  Plan: symptoms check if symptomatic then start Amoxicillin 500 mg po Q 8 hr x 7 days per ED provider Sherrill Raring, PA  Unable to contact patient after 3 attempts, letter will be sent to address on file  Glennon Hamilton 01/20/2022, 12:34 PM

## 2022-01-20 NOTE — Progress Notes (Signed)
ED Antimicrobial Stewardship Positive Culture Follow Up   Troy [name] is an 81 y.o. male who presented to [facility] on 01/17/2022 with a chief complaint of bleeding from foley cath.  Recent Results (from the past 720 hour(s))  Urine Culture     Status: None   Collection Time: 01/02/22  8:59 AM   Specimen: Urine, Catheterized  Result Value Ref Range Status   Specimen Description   Final    URINE, CATHETERIZED Performed at [facility], [address], [place], [place] [address]    Special Requests   Final    NONE Performed at [facility], [address] [place], [place] [address]    Culture   Final    NO GROWTH Performed at [facility], [place] [address] [place], [place] [address]    Report Status 01/04/2022 FINAL  Final  Urine Culture     Status: Abnormal   Collection Time: 01/17/22  2:32 AM   Specimen: Urine, Clean Catch  Result Value Ref Range Status   Specimen Description   Final    URINE, CLEAN CATCH Performed at [facility], [address] [place], [place] [address]    Special Requests   Final    NONE Performed at [facility], [address], [place], [place] [address]    Culture (A)  Final    80,000 COLONIES/mL ENTEROCOCCUS FAECALIS 80,000 COLONIES/mL GROUP B STREP(S.AGALACTIAE)ISOLATED TESTING AGAINST S. AGALACTIAE NOT ROUTINELY PERFORMED DUE TO PREDICTABILITY OF AMP/PEN/VAN SUSCEPTIBILITY. Performed at [facility], [place] [address], [place], [place] [address]    Report Status 01/19/2022 FINAL  Final   Organism ID, Bacteria ENTEROCOCCUS FAECALIS (A)  Final      Susceptibility   Enterococcus faecalis - MIC*    AMPICILLIN <=2 SENSITIVE Sensitive     NITROFURANTOIN <=16 SENSITIVE Sensitive     VANCOMYCIN 1 SENSITIVE Sensitive     * 80,000 COLONIES/mL ENTEROCOCCUS FAECALIS   Plan: 1) do symptoms check for UTI 2)  If patient is symptomatic, then start amoxicilin 500 mg PO q8h x7 days.   ED Provider: Sherrill Raring, PA-C   Lynelle Doctor 01/20/2022,  8:25 AM Clinical Pharmacist [phone]

## 2022-01-22 ENCOUNTER — Ambulatory Visit (INDEPENDENT_AMBULATORY_CARE_PROVIDER_SITE_OTHER): Payer: No Typology Code available for payment source | Admitting: Urology

## 2022-01-22 ENCOUNTER — Encounter: Payer: Self-pay | Admitting: Urology

## 2022-01-22 VITALS — BP 120/66 | HR 60

## 2022-01-22 DIAGNOSIS — N3289 Other specified disorders of bladder: Secondary | ICD-10-CM

## 2022-01-22 DIAGNOSIS — R310 Gross hematuria: Secondary | ICD-10-CM

## 2022-01-22 MED ORDER — CIPROFLOXACIN HCL 500 MG PO TABS
500.0000 mg | ORAL_TABLET | Freq: Once | ORAL | Status: AC
  Administered 2022-01-22: 500 mg via ORAL

## 2022-01-22 NOTE — Progress Notes (Signed)
   01/22/22  CC: hematuria   HPI: Troy [name] is a 81yo here for followup for gross hematuria and a benign bladder lesion Blood pressure 120/66, pulse 60. NED. A&Ox3.   No respiratory distress   Abd soft, NT, ND Normal phallus with bilateral descended testicles  Cystoscopy Procedure Note  Patient identification was confirmed, informed consent was obtained, and patient was prepped using Betadine solution.  Lidocaine jelly was administered per urethral meatus.     Pre-Procedure: - Inspection reveals a normal caliber ureteral meatus.  Procedure: The flexible cystoscope was introduced without difficulty - No urethral strictures/lesions are present. - Enlarged prostate, false passage 6 oclock at prostatic urethra - Normal bladder neck - Bilateral ureteral orifices identified - Bladder mucosa  reveals no ulcers, tumors, or lesions - No bladder stones - No trabeculation     Post-Procedure: - Patient tolerated the procedure well  Assessment/ Plan: Followup 1 week for a voiding trial  No follow-ups on file.  Nicolette Bang, MD

## 2022-01-23 ENCOUNTER — Ambulatory Visit (INDEPENDENT_AMBULATORY_CARE_PROVIDER_SITE_OTHER): Payer: No Typology Code available for payment source | Admitting: Physician Assistant

## 2022-01-23 DIAGNOSIS — N3289 Other specified disorders of bladder: Secondary | ICD-10-CM

## 2022-01-23 DIAGNOSIS — R310 Gross hematuria: Secondary | ICD-10-CM | POA: Diagnosis not present

## 2022-01-23 NOTE — Progress Notes (Signed)
Bladder Irrigation  Due to clot  patient is present today for a bladder irrigation. Patient was cleaned and prepped in a sterile fashion. 300 mL of saline/sterile water was instilled into the bladder with a 17m Toomey syringe through the catheter in place.  3063mof urine return was cleared from the bladder with evacuation of 300ccs of clot material.  Upon completion, the catheter was draining well and was reattached to the bedside bag for drainage. Patient tolerated well.   Performed by: AmGibson RampN  Additional notes/ Follow up: finding discussed with Dr. McAlyson Inglespatient to keep scheduled follow up appointment.

## 2022-01-24 ENCOUNTER — Ambulatory Visit (INDEPENDENT_AMBULATORY_CARE_PROVIDER_SITE_OTHER): Payer: No Typology Code available for payment source | Admitting: Urology

## 2022-01-24 DIAGNOSIS — I509 Heart failure, unspecified: Secondary | ICD-10-CM | POA: Diagnosis not present

## 2022-01-24 DIAGNOSIS — R310 Gross hematuria: Secondary | ICD-10-CM | POA: Diagnosis not present

## 2022-01-24 DIAGNOSIS — Z6830 Body mass index (BMI) 30.0-30.9, adult: Secondary | ICD-10-CM | POA: Diagnosis not present

## 2022-01-24 DIAGNOSIS — R339 Retention of urine, unspecified: Secondary | ICD-10-CM

## 2022-01-24 DIAGNOSIS — E6609 Other obesity due to excess calories: Secondary | ICD-10-CM | POA: Diagnosis not present

## 2022-01-24 DIAGNOSIS — E860 Dehydration: Secondary | ICD-10-CM | POA: Diagnosis not present

## 2022-01-24 NOTE — Progress Notes (Signed)
Patient came today with complaints of catheter not draining. Discussed with Dr.McKenzie, will irrigate bladder and then remove foley.  Catheter Removal  Patient is present today for a catheter removal.  60m of water was drained from the balloon. A 18 coude FR foley cath was removed from the bladder, no complications were noted. Patient tolerated well.  Performed by: AGibson RampRN  Follow up/ Additional notes:  will keep scheduled f/u appt Monday or call sooner if issues arise.   PNicolette Bang

## 2022-01-25 ENCOUNTER — Encounter: Payer: Self-pay | Admitting: Urology

## 2022-01-25 NOTE — Patient Instructions (Signed)

## 2022-01-25 NOTE — Progress Notes (Signed)
01/17/2022 8:48 AM   Troy [name] 1941-02-07 [id number]  Referring provider: Sharilyn Sites, [address] [place],  [place] [address]  Gross hematuria  HPI: Troy [name] is a 81yo here for evaluation of worsening gross hematuria. He currently has an indwelling foley after traumatic foley insertion. This morning he had a blood clot and presented to the ER. His foley was placed on traction and he was discharged home. Currently he has bleeding around foley but his urine is clear in the foley bag. NO pelvic pain.    PMH: Past Medical History:  Diagnosis Date   AAA (abdominal aortic aneurysm) ([place])    Followed by Dr. Sherren Mocha Early   Arthritis    CAD (coronary artery disease)    a. CABG 2000.   Cancer [facility])    Prostate:  Radiation Tx   Carotid artery disease ([place])    Chest pain    precordial. mild chronic .[name]... nonischemic   CHF (congestive heart failure) (H[facility])    Chronic edema    Coronary artery disease    a. Nuclear, January, 2008, no ischemia b. Cath 08/2012- 1/4 patent grafts, RCA CTO, no flow-limiting disease, medically managed   Diabetes mellitus without complication ([facility])    Dizziness 02/2011   Dyslipidemia    Fall    GERD (gastroesophageal reflux disease)    TAKES TUMS & ROLAIDS AS NEEDED   Hx of CABG 2000   Hypertension    Myocardial infarction ([place]) 1999   Neck pain 02/2011   Pneumonia    Pulmonary hypertension ([place])    Renal artery stenosis (H[facility])    50-70%   S/P femoropopliteal bypass surgery    Dr. Donnetta Hutching   Sinus bradycardia    Asymptomatic    Surgical History: Past Surgical History:  Procedure Laterality Date   CARDIAC CATHETERIZATION  09/26/2012   1/4 patent bypass (occluded SVG-PDA, SVG-OM, LIMA-LAD), SVG-diagonal patent and fills the diagonal and LAD, distal RCA occlusion with left to right collateralization, patent circumflex, LAD with no flow-limiting disease and antegrade flow competitively from SVG-diagonal; EF 60-65%   COLONOSCOPY  11/30/2009    EXB:[id number]. next TCS 11/2019   COLONOSCOPY WITH PROPOFOL N/A 12/18/2019   Procedure: COLONOSCOPY WITH PROPOFOL;  Surgeon: Harvel Quale, MD;  [facility];  Service: Gastroenterology;  Laterality: N/A;  1030   CORONARY ARTERY BYPASS GRAFT  2000   CYSTOSCOPY N/A 08/31/2021   Procedure: CYSTOSCOPY;  Surgeon: Cleon Gustin, MD;  Location: [facility] ORS;  Service: Urology;  Laterality: N/A;  pt knows to arrive at 7:00   ESOPHAGOGASTRODUODENOSCOPY N/A 08/12/2013   ZDG:[id number] peptic stricture with erosive refluxesophagitis - status post Maloney dilation. Hiatal hernia. Abnormalgastric mucosa. Deformity of the pyloric channel suggestive ofprior peptic ulcer disease. Duodenal bulbar diverticulum Statuspost gastric biopsy. h.pylori   LEFT HEART CATHETERIZATION WITH CORONARY/GRAFT ANGIOGRAM N/A 09/26/2012   Procedure: LEFT HEART CATHETERIZATION WITH Beatrix Fetters;  Surgeon: Burnell Blanks, MD;  Location: [facility] CATH LAB;  Service: Cardiovascular;  Laterality: N/A;   MALONEY DILATION N/A 08/12/2013   Procedure: Venia Minks DILATION;  Surgeon: Daneil Dolin, MD;  Location: [facility];  Service: Endoscopy;  Laterality: N/A;   POLYPECTOMY  12/18/2019   Procedure: POLYPECTOMY;  Surgeon: Harvel Quale, MD;  Location: [facility];  Service: Gastroenterology;;  ascending colon polyp    PR VEIN BYPASS GRAFT,AORTO-FEM-POP Right 07/19/1999   PR VEIN BYPASS GRAFT,AORTO-FEM-POP Left 05/02/2006   RIGHT HEART CATH AND CORONARY/GRAFT ANGIOGRAPHY N/A 04/11/2020   Procedure:  RIGHT HEART CATH AND CORONARY/GRAFT ANGIOGRAPHY;  Surgeon: Larey Dresser, MD;  Location: [facility] CV LAB;  Service: Cardiovascular;  Laterality: N/A;   SAVORY DILATION N/A 08/12/2013   Procedure: SAVORY DILATION;  Surgeon: Daneil Dolin, MD;  [facility];  Service: Endoscopy;  Laterality: N/A;   TRANSURETHRAL RESECTION OF BLADDER TUMOR N/A 08/31/2021   Procedure:  TRANSURETHRAL RESECTION OF BLADDER TUMOR (TURBT);  Surgeon: Cleon Gustin, MD;  Location: [facility];  Service: Urology;  Laterality: N/A;   WRIST SURGERY     cyst removal    Home Medications:  Allergies as of 01/17/2022       Reactions   Niacin Itching, Rash   Burning sensation   Contrast Media [iodinated Contrast Media] Nausea And Vomiting   Iodine-131 Nausea And Vomiting   Nsaids Other (See Comments)   Taking Coumadin        Medication List        Accurate as of January 17, 2022 11:59 PM. If you have any questions, ask your nurse or doctor.          acetaminophen 325 MG tablet Commonly known as: TYLENOL Take 2 tablets (650 mg total) by mouth every 6 (six) hours as needed for mild pain, fever or headache (or Fever >/= 101).   albuterol 108 (90 Base) MCG/ACT inhaler Commonly known as: VENTOLIN HFA Inhale 2 puffs into the lungs every 6 (six) hours as needed for wheezing or shortness of breath.   aspirin EC 81 MG tablet Take 1 tablet (81 mg total) by mouth daily with breakfast.   atorvastatin 40 MG tablet Commonly known as: LIPITOR Take 1 tablet (40 mg total) by mouth daily.   dutasteride 0.5 MG capsule Commonly known as: AVODART Take 1 capsule (0.5 mg total) by mouth daily.   famotidine 20 MG tablet Commonly known as: Pepcid One after supper What changed:  how much to take how to take this when to take this   HYDROcodone-acetaminophen 5-325 MG tablet Commonly known as: NORCO/VICODIN Take 1 tablet by mouth daily as needed for moderate pain.   ipratropium 0.03 % nasal spray Commonly known as: ATROVENT Place 2 sprays into both nostrils as needed for rhinitis.   ipratropium-albuterol 0.5-2.5 (3) MG/3ML Soln Commonly known as: DUONEB Take 3 mLs by nebulization every 4 (four) hours as needed.   isosorbide mononitrate 60 MG 24 hr tablet Commonly known as: IMDUR Take 2 tablets (120 mg total) by mouth every evening.   latanoprost 0.005 % ophthalmic  solution Commonly known as: XALATAN Place 1 drop into both eyes at bedtime.   losartan 25 MG tablet Commonly known as: COZAAR Take 1 tablet (25 mg total) by mouth daily.   metFORMIN 500 MG tablet Commonly known as: GLUCOPHAGE Take 500 mg by mouth 2 (two) times daily.   metoprolol succinate 25 MG 24 hr tablet Commonly known as: TOPROL-XL Take 1 tablet (25 mg total) by mouth daily. Pt. Needs to make an appt. With Cardiologist in order to receive further refills. Thank You. 1st Attempt.   nitroGLYCERIN 0.4 MG SL tablet Commonly known as: NITROSTAT Place 1 tablet (0.4 mg total) under the tongue every 5 (five) minutes x 3 doses as needed for chest pain.   OXYGEN Inhale 3 L into the lungs at bedtime.   pantoprazole 40 MG tablet Commonly known as: PROTONIX Take 1 tablet (40 mg total) by mouth daily. Take 30-60 min before first meal of the day   spironolactone 25 MG  tablet Commonly known as: ALDACTONE Take 0.5 tablets (12.5 mg total) by mouth daily.   tamsulosin 0.4 MG Caps capsule Commonly known as: FLOMAX Take 1 capsule (0.4 mg total) by mouth daily after supper.   torsemide 10 MG tablet Commonly known as: DEMADEX Take 1 tablet (10 mg total) by mouth every other day. Take 20 mg for weight gain over 3 lbs in 24 hours   Vitamin D (Ergocalciferol) 1.25 MG (50000 UNIT) Caps capsule Commonly known as: DRISDOL Take 1 capsule by mouth once a week.   warfarin 2 MG tablet Commonly known as: COUMADIN Take 2 mg by mouth daily.        Allergies:  Allergies  Allergen Reactions   Niacin Itching and Rash    Burning sensation   Contrast Media [Iodinated Contrast Media] Nausea And Vomiting   Iodine-131 Nausea And Vomiting   Nsaids Other (See Comments)    Taking Coumadin    Family History: Family History  Problem Relation Age of Onset   Deep vein thrombosis Father    Lung cancer Sister    Diabetes Sister    Heart disease Sister        After age 57   Hyperlipidemia Sister     Hypertension Sister    Lung cancer Sister    Breast cancer Sister    Hypertension Mother    Diabetes Sister    Coronary artery disease Other        family hx of   Cancer Brother        "crab cancer"   Heart disease Brother    Heart attack Brother    Heart attack Daughter    Colon cancer Neg Hx    Stroke Neg Hx     Social History:  reports that he quit smoking about 3 years ago. His smoking use included cigarettes. He has a 140.00 pack-year smoking history. He has never used smokeless tobacco. He reports current alcohol use. He reports that he does not use drugs.  ROS: All other review of systems were reviewed and are negative except what is noted above in HPI  Physical Exam: There were no vitals taken for this visit.  Constitutional:  Alert and oriented, No acute distress. HEENT: [place] AT, moist mucus membranes.  Trachea midline, no masses. Cardiovascular: No clubbing, cyanosis, or edema. Respiratory: Normal respiratory effort, no increased work of breathing. GI: Abdomen is soft, nontender, nondistended, no abdominal masses GU: No CVA tenderness.  Lymph: No cervical or inguinal lymphadenopathy. Skin: No rashes, bruises or suspicious lesions. Neurologic: Grossly intact, no focal deficits, moving all 4 extremities. Psychiatric: Normal mood and affect.  Laboratory Data: Lab Results  Component Value Date   WBC 8.8 01/17/2022   HGB 11.3 (L) 01/17/2022   HCT 35.6 (L) 01/17/2022   MCV 89.2 01/17/2022   PLT 202 01/17/2022    Lab Results  Component Value Date   CREATININE 0.81 01/17/2022    No results found for: "PSA"  No results found for: "TESTOSTERONE"  Lab Results  Component Value Date   HGBA1C 6.9 (H) 01/02/2022    Urinalysis    Component Value Date/Time   COLORURINE RED (A) 01/02/2022 0857   APPEARANCEUR HAZY (A) 01/02/2022 0857   APPEARANCEUR Cloudy (A) 11/13/2021 1111   LABSPEC 1.005 01/02/2022 0857   PHURINE 8.0 01/02/2022 0857   GLUCOSEU NEGATIVE  01/02/2022 0857   HGBUR MODERATE (A) 01/02/2022 0857   BILIRUBINUR NEGATIVE 01/02/2022 0857   BILIRUBINUR Negative 11/13/2021 1111   KETONESUR  NEGATIVE 01/02/2022 0857   PROTEINUR 30 (A) 01/02/2022 0857   NITRITE NEGATIVE 01/02/2022 0857   LEUKOCYTESUR NEGATIVE 01/02/2022 0857    Lab Results  Component Value Date   LABMICR See below: 11/13/2021   WBCUA 11-30 (A) 11/13/2021   LABEPIT 0-10 11/13/2021   MUCUS Present 10/27/2021   BACTERIA NONE SEEN 01/02/2022    Pertinent Imaging:  No results found for this or any previous visit.  No results found for this or any previous visit.  No results found for this or any previous visit.  No results found for this or any previous visit.  No results found for this or any previous visit.  No valid procedures specified. Results for orders placed during the hospital encounter of 08/18/21  CT HEMATURIA WORKUP  Narrative CLINICAL DATA:  Gross hematuria since early May, history of prostate cancer * Tracking Code: BO *  EXAM: CT ABDOMEN AND PELVIS WITHOUT AND WITH CONTRAST  TECHNIQUE: Multidetector CT imaging of the abdomen and pelvis was performed following the standard protocol before and following the bolus administration of intravenous contrast.  RADIATION DOSE REDUCTION: This exam was performed according to the departmental dose-optimization program which includes automated exposure control, adjustment of the mA and/or kV according to patient size and/or use of iterative reconstruction technique.  CONTRAST:  184m OMNIPAQUE IOHEXOL 300 MG/ML  SOLN  COMPARISON:  None Available.  FINDINGS: Lower chest: No acute abnormality. Cardiomegaly. Coronary artery calcifications.  Hepatobiliary: No solid liver abnormality is seen. Small, definitively benign cyst or hemangioma of the central liver dome for which no further follow-up or characterization is required (series 15, image 30). No gallstones, gallbladder wall thickening,  or biliary dilatation.  Pancreas: Unremarkable. No pancreatic ductal dilatation or surrounding inflammatory changes.  Spleen: Normal in size without significant abnormality.  Adrenals/Urinary Tract: Adrenal glands are unremarkable. Small simple, benign bilateral renal cortical cysts, as well as intrinsically hyperdense, nonenhancing hemorrhagic or proteinaceous cysts of the midportions of the left and right kidneys (series 2, image 37). No urinary tract filling defect on delayed phase imaging. Bladder is unremarkable.  Stomach/Bowel: Stomach is within normal limits. Appendix appears normal. No evidence of bowel wall thickening, distention, or inflammatory changes. Descending and sigmoid diverticulosis.  Vascular/Lymphatic: Aortic atherosclerosis. 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 No enlarged abdominal or pelvic lymph nodes.  Reproductive: Prostate brachytherapy.  Other: Small, fat containing bilateral inguinal hernias. No ascites.  Musculoskeletal: No acute or significant osseous findings.  IMPRESSION: 1. No evidence of urinary tract mass, calculus, or hydronephrosis. No urinary tract filling defect on delayed phase imaging. 2. Prostate brachytherapy. No evidence of mass or lymphadenopathy in the abdomen or pelvis. 3. Small simple, benign bilateral renal cortical cysts, as well as nonenhancing, benign hemorrhagic or proteinaceous cysts. No further follow-up or characterization is required for these benign renal cysts. 4. Aneurysm of the infrarenal abdominal aorta, caliber of the aortic lumen measuring up to 4.9 x 4.2 cm, additionally with a large, infrarenal left eccentric thrombosed saccular  aneurysm or pseudoaneurysm component measuring 3.4 x 3.4 cm, overall greatest dimensions of the vessel at this site 7.6 x 5.1 cm. Recommend referral to a vascular specialist. This recommendation follows ACR consensus guidelines: White Paper of the ACR Incidental Findings  Committee II on Vascular Findings. J Am Coll Radiol 2013; 10:789-794. 5. Cardiomegaly and coronary artery disease.  Aortic Atherosclerosis (ICD10-I70.0).   Electronically Signed By: Delanna Ahmadi M.D. On: 08/19/2021 11:29  No results found for this or any previous visit.   Assessment & Plan:    1. Gross hematuria -Foley irrigated to clear. Patient instructed to increase water intake   No follow-ups on file.  Nicolette Bang, MD  [facility] [place]

## 2022-01-29 ENCOUNTER — Telehealth: Payer: Self-pay

## 2022-01-29 ENCOUNTER — Ambulatory Visit (INDEPENDENT_AMBULATORY_CARE_PROVIDER_SITE_OTHER): Payer: No Typology Code available for payment source | Admitting: Physician Assistant

## 2022-01-29 ENCOUNTER — Encounter: Payer: Self-pay | Admitting: Physician Assistant

## 2022-01-29 ENCOUNTER — Telehealth (HOSPITAL_BASED_OUTPATIENT_CLINIC_OR_DEPARTMENT_OTHER): Payer: Self-pay | Admitting: Emergency Medicine

## 2022-01-29 VITALS — BP 131/72 | HR 73 | Wt 194.0 lb

## 2022-01-29 DIAGNOSIS — Z9889 Other specified postprocedural states: Secondary | ICD-10-CM | POA: Diagnosis not present

## 2022-01-29 DIAGNOSIS — Z8603 Personal history of neoplasm of uncertain behavior: Secondary | ICD-10-CM

## 2022-01-29 DIAGNOSIS — R310 Gross hematuria: Secondary | ICD-10-CM

## 2022-01-29 DIAGNOSIS — R3911 Hesitancy of micturition: Secondary | ICD-10-CM

## 2022-01-29 DIAGNOSIS — N3001 Acute cystitis with hematuria: Secondary | ICD-10-CM | POA: Diagnosis not present

## 2022-01-29 DIAGNOSIS — Z7901 Long term (current) use of anticoagulants: Secondary | ICD-10-CM | POA: Diagnosis not present

## 2022-01-29 LAB — BLADDER SCAN AMB NON-IMAGING: Scan Result: 63

## 2022-01-29 MED ORDER — AMOXICILLIN-POT CLAVULANATE 875-125 MG PO TABS: 1.0000 | ORAL_TABLET | Freq: Two times a day (BID) | ORAL | 0 refills | Status: DC

## 2022-01-29 NOTE — Progress Notes (Signed)
Assessment: 1. Hesitancy of micturition - BLADDER SCAN AMB NON-IMAGING - Urinalysis, Routine w reflex microscopic  2. Acute cystitis with hematuria  3. Gross hematuria  4. H/O transurethral resection of bladder tumor (TURBT)  5. Anticoagulated on Coumadin    Plan: Discussed urine cx from 01/17/22 obtained in the ED. Pt started on Augmentin and advised to continue Avodart and tamsulosin and to hold Myrbetriq for the next 2 nights.  Increase fluid intake and FU in 3-4 days for PVR if still having difficulty with stream. ED precautions again discussed.   Meds ordered this encounter  Medications   amoxicillin-clavulanate (AUGMENTIN) 875-125 MG tablet    Sig: Take 1 tablet by mouth every 12 (twelve) hours.    Dispense:  14 tablet    Refill:  0     Chief Complaint: No chief complaint on file.   HPI: Troy [name] is a 81 y.o. male with history of benign bladder tumor who presents for continued evaluation of urinary retention post cystoscopy on 01/22/22. Pt did not tolerate cath well and had successful VT on 10/25. Pt continues to have occasional gross hematuria and has to move around on occasion to get urine to flow. No fever, chills, NV. No abdominal pain. Pt does have low back discomfort.  UA=pt had no urge to void when attempted PVR=52m Urine cx noted enterococcus and Gr B Strep from ED visit on 10/18. No antibx since.   01/22/22 Troy [name] a [id number] for followup for gross hematuria and a benign bladder lesion Blood pressure 120/66, pulse 60. NED. A&Ox3.   No respiratory distress   Abd soft, NT, ND Normal phallus with bilateral descended testicles  Portions of the above documentation were copied from a prior visit for review purposes only.  Allergies: Allergies  Allergen Reactions   Niacin Itching and Rash    Burning sensation   Contrast Media [Iodinated Contrast Media] Nausea And Vomiting   Iodine-131 Nausea And Vomiting   Nsaids Other (See Comments)     Taking Coumadin    PMH: Past Medical History:  Diagnosis Date   AAA (abdominal aortic aneurysm) (HCC)    Followed by Dr. TSherren MochaEarly   Arthritis    CAD (coronary artery disease)    a. CABG 2000.   Cancer ([facility]    Prostate:  Radiation Tx   Carotid artery disease ([facility] [place]    Chest pain    precordial. mild chronic ..[name].. nonischemic   CHF (congestive heart failure) (HCC)    Chronic edema    Coronary artery disease    a. Nuclear, January, 2008, no ischemia b. Cath 08/2012- 1/4 patent grafts, RCA CTO, no flow-limiting disease, medically managed   Di[place] complication [place]    Dizziness 02/2011   Dyslipidemia    Fall    GERD (gastroesophageal reflux disease)    TAKES TUMS & ROLAIDS AS NEEDED   Hx of CABG 2000   Hypertension    [place]ocardial [place]   Neck pain 02/2011   Pneumonia    Pulmonary hypertension ([place]    Renal artery stenosis (HCC)    50-70%   S/P femoropopliteal bypass surgery    Dr. EDonnetta Hutching  Sinus bradycardia    Asymptomatic    PSH: Past Surgical History:  Procedure Laterality Date   CARDIAC CATHETERIZATION  09/26/2012   1/4 patent bypass (occluded SVG-PDA, SVG-OM, LIMA-LAD), SVG-diagonal patent and fills the diagonal and LAD, distal RCA occlusion with left to right collateralization, patent circumflex,  LAD with no flow-limiting disease and antegrade flow competitively from SVG-diagonal; EF 60-65%   COLONOSCOPY  11/30/2009   WFU:[id number]. next TCS 11/2019   COLONOSCOPY WITH PROPOFOL N/A 12/18/2019   Procedure: COLONOSCOPY WITH PROPOFOL;  Surgeon: Harvel Quale, MD;  [facility];  Service: Gastroenterology;  Laterality: N/A;  1030   CORONARY ARTERY BYPASS GRAFT  2000   CYSTOSCOPY N/A 08/31/2021   Procedure: CYSTOSCOPY;  Surgeon: Cleon Gustin, MD;  Location: [facility];  Service: Urology;  Laterality: N/A;  pt knows to arrive at 7:00   ESOPHAGOGASTRODUODENOSCOPY N/A 08/12/2013   YHC:[id number] peptic  stricture with erosive refluxesophagitis - status post Maloney dilation. Hiatal hernia. Abnormalgastric mucosa. Deformity of the pyloric channel suggestive ofprior peptic ulcer disease. Duodenal bulbar diverticulum Statuspost gastric biopsy. h.pylori   LEFT HEART CATHETERIZATION WITH CORONARY/GRAFT ANGIOGRAM N/A 09/26/2012   Procedure: LEFT HEART CATHETERIZATION WITH Beatrix Fetters;  Surgeon: Burnell Blanks, MD;  Location: [facility] CATH LAB;  Service: Cardiovascular;  Laterality: N/A;   MALONEY DILATION N/A 08/12/2013   Procedure: Venia Minks DILATION;  Surgeon: Daneil Dolin, MD;  Location: [facility];  Service: Endoscopy;  Laterality: N/A;   POLYPECTOMY  12/18/2019   Procedure: POLYPECTOMY;  Surgeon: Harvel Quale, MD;  [facility];  Service: Gastroenterology;;  ascending colon polyp    PR VEIN BYPASS GRAFT,AORTO-FEM-POP Right 07/19/1999   PR VEIN BYPASS GRAFT,AORTO-FEM-POP Left 05/02/2006   RIGHT HEART CATH AND CORONARY/GRAFT ANGIOGRAPHY N/A 04/11/2020   Procedure: RIGHT HEART CATH AND CORONARY/GRAFT ANGIOGRAPHY;  Surgeon: Larey Dresser, MD;  Location: [place] CV LAB;  Service: Cardiovascular;  Laterality: N/A;   SAVORY DILATION N/A 08/12/2013   Procedure: SAVORY DILATION;  Surgeon: Daneil Dolin, MD;  [facility];  Service: Endoscopy;  Laterality: N/A;   TRANSURETHRAL RESECTION OF BLADDER TUMOR N/A 08/31/2021   Procedure: TRANSURETHRAL RESECTION OF BLADDER TUMOR (TURBT);  Surgeon: Cleon Gustin, MD;  Location: [facility];  Service: Urology;  Laterality: N/A;   WRIST SURGERY     cyst removal    SH: Social History   Tobacco Use   Smoking status: Former    Packs/day: 2.00    Years: 70.00    Total pack years: 140.00    Types: Cigarettes    Quit date: 04/21/2018    Years since quitting: 3.7   Smokeless tobacco: Never  Vaping Use   Vaping Use: Never used  Substance Use Topics   Alcohol use: Yes    Alcohol/week: 0.0 - 2.0 standard  drinks of alcohol    Comment: OCCASIONAL   Drug use: Never    ROS: All other review of systems were reviewed and are negative except what is noted above in HPI  PE: BP 131/72   Pulse 73   Wt 194 lb (88 kg)   BMI 27.84 kg/m  GENERAL APPEARANCE:  Well appearing, well developed, well nourished, NAD HEENT:  Atraumatic, normocephalic NECK:  Supple. Trachea midline ABDOMEN:  Soft, non-tender, no masses EXTREMITIES:  Moves all extremities well NEUROLOGIC:  Alert and oriented x 3 MENTAL STATUS:  appropriate BACK:  Non-tender to palpation, No CVAT SKIN:  Warm, dry, and intact   Results: Laboratory Data: Lab Results  Component Value Date   WBC 8.8 01/17/2022   HGB 11.3 (L) 01/17/2022   HCT 35.6 (L) 01/17/2022   MCV 89.2 01/17/2022   PLT 202 01/17/2022    Lab Results  Component Value Date   CREATININE 0.81 01/17/2022  No results found for: "PSA"  No results found for: "TESTOSTERONE"  Lab Results  Component Value Date   HGBA1C 6.9 (H) 01/02/2022    Urinalysis    Component Value Date/Time   COLORURINE RED (A) 01/02/2022 0857   APPEARANCEUR HAZY (A) 01/02/2022 0857   APPEARANCEUR Cloudy (A) 11/13/2021 1111   LABSPEC 1.005 01/02/2022 0857   PHURINE 8.0 01/02/2022 0857   GLUCOSEU NEGATIVE 01/02/2022 0857   HGBUR MODERATE (A) 01/02/2022 0857   BILIRUBINUR NEGATIVE 01/02/2022 0857   BILIRUBINUR Negative 11/13/2021 [place] 01/02/2022 0857   PROTEINUR 30 (A) 01/02/2022 0857   NITRITE NEGATIVE 01/02/2022 0857   LEUKOCYTESUR NEGATIVE 01/02/2022 0857    Lab Results  Component Value Date   LABMICR See below: 11/13/2021   WBCUA 11-30 (A) 11/13/2021   LABEPIT 0-10 11/13/2021   MUCUS Present 10/27/2021   BACTERIA NONE SEEN 01/02/2022    Pertinent Imaging: No results found for this or any previous visit.  No results found for this or any previous visit.  No results found for this or any previous visit.  No results found for this or any  previous visit.  No results found for this or any previous visit.  No valid procedures specified. Results for orders placed during the hospital encounter of 08/18/21  CT HEMATURIA WORKUP  Narrative CLINICAL DATA:  Gross hematuria since early May, history of prostate cancer * Tracking Code: BO *  EXAM: CT ABDOMEN AND PELVIS WITHOUT AND WITH CONTRAST  TECHNIQUE: Multidetector CT imaging of the abdomen and pelvis was performed following the standard protocol before and following the bolus administration of intravenous contrast.  RADIATION DOSE REDUCTION: This exam was performed according to the departmental dose-optimization program which includes automated exposure control, adjustment of the mA and/or kV according to patient size and/or use of iterative reconstruction technique.  CONTRAST:  145m OMNIPAQUE IOHEXOL 300 MG/ML  SOLN  COMPARISON:  None Available.  FINDINGS: Lower chest: No acute abnormality. Cardiomegaly. Coronary artery calcifications.  Hepatobiliary: No solid liver abnormality is seen. Small, definitively benign cyst or hemangioma of the central liver dome for which no further follow-up or characterization is required (series 15, image 30). No gallstones, gallbladder wall thickening, or biliary dilatation.  Pancreas: Unremarkable. No pancreatic ductal dilatation or surrounding inflammatory changes.  Spleen: Normal in size without significant abnormality.  Adrenals/Urinary Tract: Adrenal glands are unremarkable. Small simple, benign bilateral renal cortical cysts, as well as intrinsically hyperdense, nonenhancing hemorrhagic or proteinaceous cysts of the midportions of the left and right kidneys (series 2, image 37). No urinary tract filling defect on delayed phase imaging. Bladder is unremarkable.  Stomach/Bowel: Stomach is within normal limits. Appendix appears normal. No evidence of bowel wall thickening, distention, or inflammatory changes.  Descending and sigmoid diverticulosis.  Vascular/Lymphatic: Aortic atherosclerosis. 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 No enlarged abdominal or pelvic lymph nodes.  Reproductive: Prostate brachytherapy.  Other: Small, fat containing bilateral inguinal hernias. No ascites.  Musculoskeletal: No acute or significant osseous findings.  IMPRESSION: 1. No evidence of urinary tract mass, calculus, or hydronephrosis. No urinary tract filling defect on delayed phase imaging. 2. Prostate brachytherapy. No evidence of mass or lymphadenopathy in the abdomen or pelvis. 3. Small simple, benign  bilateral renal cortical cysts, as well as nonenhancing, benign hemorrhagic or proteinaceous cysts. No further follow-up or characterization is required for these benign renal cysts. 4. 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 Recommend referral to a vascular specialist. This recommendation follows ACR consensus guidelines: White Paper of the ACR Incidental Findings Committee II on Vascular Findings. J Am Coll Radiol 2013; 10:789-794. 5. Cardiomegaly and coronary artery disease.  Aortic Atherosclerosis (ICD10-I70.0).   Electronically Signed By: Delanna Ahmadi M.D. On: 08/19/2021 11:29  No results found for this or any previous visit.  No results found for this or any previous visit (from the past 24 hour(s)).

## 2022-01-29 NOTE — Progress Notes (Signed)
post void residual =63

## 2022-01-29 NOTE — Telephone Encounter (Signed)
Made patient aware to take his antibx every 12hrs until completed. Patient voiced understanding

## 2022-01-29 NOTE — Patient Instructions (Addendum)
Continue tamsulosin daily until follow up visit.

## 2022-02-02 ENCOUNTER — Encounter: Payer: Self-pay | Admitting: Urology

## 2022-02-02 ENCOUNTER — Ambulatory Visit (INDEPENDENT_AMBULATORY_CARE_PROVIDER_SITE_OTHER): Payer: No Typology Code available for payment source | Admitting: Urology

## 2022-02-02 VITALS — BP 115/57 | HR 54

## 2022-02-02 DIAGNOSIS — R310 Gross hematuria: Secondary | ICD-10-CM | POA: Diagnosis not present

## 2022-02-02 DIAGNOSIS — R339 Retention of urine, unspecified: Secondary | ICD-10-CM

## 2022-02-02 DIAGNOSIS — R350 Frequency of micturition: Secondary | ICD-10-CM

## 2022-02-02 LAB — URINALYSIS, ROUTINE W REFLEX MICROSCOPIC
Bilirubin, UA: NEGATIVE
Glucose, UA: NEGATIVE
Ketones, UA: NEGATIVE
Leukocytes,UA: NEGATIVE
Nitrite, UA: NEGATIVE
Protein,UA: NEGATIVE
Specific Gravity, UA: 1.01 (ref 1.005–1.030)
Urobilinogen, Ur: 0.2 mg/dL (ref 0.2–1.0)
pH, UA: 7 (ref 5.0–7.5)

## 2022-02-02 LAB — MICROSCOPIC EXAMINATION
Bacteria, UA: NONE SEEN
WBC, UA: NONE SEEN /hpf (ref 0–5)

## 2022-02-02 LAB — BLADDER SCAN AMB NON-IMAGING: Scan Result: 141

## 2022-02-02 MED ORDER — TAMSULOSIN HCL 0.4 MG PO CAPS: 0.4000 mg | ORAL_CAPSULE | Freq: Every day | ORAL | 3 refills | Status: DC

## 2022-02-02 MED ORDER — DUTASTERIDE 0.5 MG PO CAPS: 0.5000 mg | ORAL_CAPSULE | Freq: Every day | ORAL | 11 refills | Status: DC

## 2022-02-02 NOTE — Patient Instructions (Signed)

## 2022-02-02 NOTE — Progress Notes (Signed)
post void residual=141 

## 2022-02-02 NOTE — Progress Notes (Signed)
02/02/2022 11:09 AM   Troy [name] 09/18/1940 [id number]  Referring provider: Sharilyn Sites, [address] [place],  [place] [address]  Gross hematuria   HPI: Mr Dolberry is a 81yo here for followup for urinary retention and gross hematuria. He passed a clot this week and then his urine has been clear. IPSS 12 QOL 2 on flomax and dutasteride. NO gross hematuria in a week. PVR 141cc. No other comaplitns today   PMH: Past Medical History:  Diagnosis Date   AAA (abdominal aortic aneurysm) ([place])    Followed by Dr. Sherren Mocha Early   Arthritis    CAD (coronary artery disease)    a. CABG 2000.   Cancer [facility])    Prostate:  Radiation Tx   Carotid artery disease ([place])    Chest pain    precordial. mild chronic .[name]... nonischemic   CHF (congestive heart failure) (HCC)    Chronic edema    Coronary artery disease    a. Nuclear, January, 2008, no ischemia b. Cath 08/2012- 1/4 patent grafts, RCA CTO, no flow-limiting disease, medically managed   Diabetes mellitus without complication ([place])    Dizziness 02/2011   Dyslipidemia    Fall    GERD (gastroesophageal reflux disease)    TAKES TUMS & ROLAIDS AS NEEDED   Hx of CABG 2000   Hypertension    Myocardial infarction ([place]) 1999   Neck pain 02/2011   Pneumonia    Pulmonary hypertension ([place])    Renal artery stenosis (HCC)    50-70%   S/P femoropopliteal bypass surgery    Dr. Donnetta Hutching   Sinus bradycardia    Asymptomatic    Surgical History: Past Surgical History:  Procedure Laterality Date   CARDIAC CATHETERIZATION  09/26/2012   1/4 patent bypass (occluded SVG-PDA, SVG-OM, LIMA-LAD), SVG-diagonal patent and fills the diagonal and LAD, distal RCA occlusion with left to right collateralization, patent circumflex, LAD with no flow-limiting disease and antegrade flow competitively from SVG-diagonal; EF 60-65%   COLONOSCOPY  11/30/2009   VQX:[id number]. next TCS 11/2019   COLONOSCOPY WITH PROPOFOL N/A 12/18/2019   Procedure:  COLONOSCOPY WITH PROPOFOL;  Surgeon: Harvel Quale, MD;  [facility];  Service: Gastroenterology;  Laterality: N/A;  1030   CORONARY ARTERY BYPASS GRAFT  2000   CYSTOSCOPY N/A 08/31/2021   Procedure: CYSTOSCOPY;  Surgeon: Cleon Gustin, MD;  Location: [facility] ORS;  Service: Urology;  Laterality: N/A;  pt knows to arrive at 7:00   ESOPHAGOGASTRODUODENOSCOPY N/A 08/12/2013   ZPH:[id number] peptic stricture with erosive refluxesophagitis - status post Maloney dilation. Hiatal hernia. Abnormalgastric mucosa. Deformity of the pyloric channel suggestive ofprior peptic ulcer disease. Duodenal bulbar diverticulum Statuspost gastric biopsy. h.pylori   LEFT HEART CATHETERIZATION WITH CORONARY/GRAFT ANGIOGRAM N/A 09/26/2012   Procedure: LEFT HEART CATHETERIZATION WITH Beatrix Fetters;  Surgeon: Burnell Blanks, MD;  Location: [facility] CATH LAB;  Service: Cardiovascular;  Laterality: N/A;   MALONEY DILATION N/A 08/12/2013   Procedure: Venia Minks DILATION;  Surgeon: Daneil Dolin, MD;  Location: [facility];  Service: Endoscopy;  Laterality: N/A;   POLYPECTOMY  12/18/2019   Procedure: POLYPECTOMY;  Surgeon: Harvel Quale, MD;  [facility];  Service: Gastroenterology;;  ascending colon polyp    PR VEIN BYPASS GRAFT,AORTO-FEM-POP Right 07/19/1999   PR VEIN BYPASS GRAFT,AORTO-FEM-POP Left 05/02/2006   RIGHT HEART CATH AND CORONARY/GRAFT ANGIOGRAPHY N/A 04/11/2020   Procedure: RIGHT HEART CATH AND CORONARY/GRAFT ANGIOGRAPHY;  Surgeon: Larey Dresser, MD;  Location: [place]  CV LAB;  Service: Cardiovascular;  Laterality: N/A;   SAVORY DILATION N/A 08/12/2013   Procedure: SAVORY DILATION;  Surgeon: Daneil Dolin, MD;  [facility];  Service: Endoscopy;  Laterality: N/A;   TRANSURETHRAL RESECTION OF BLADDER TUMOR N/A 08/31/2021   Procedure: TRANSURETHRAL RESECTION OF BLADDER TUMOR (TURBT);  Surgeon: Cleon Gustin, MD;  Location: [facility];   Service: Urology;  Laterality: N/A;   WRIST SURGERY     cyst removal    Home Medications:  Allergies as of 02/02/2022       Reactions   Niacin Itching, Rash   Burning sensation   Contrast Media [iodinated Contrast Media] Nausea And Vomiting   Iodine-131 Nausea And Vomiting   Nsaids Other (See Comments)   Taking Coumadin        Medication List        Accurate as of February 02, 2022 11:09 AM. If you have any questions, ask your nurse or doctor.          acetaminophen 325 MG tablet Commonly known as: TYLENOL Take 2 tablets (650 mg total) by mouth every 6 (six) hours as needed for mild pain, fever or headache (or Fever >/= 101).   albuterol 108 (90 Base) MCG/ACT inhaler Commonly known as: VENTOLIN HFA Inhale 2 puffs into the lungs every 6 (six) hours as needed for wheezing or shortness of breath.   amoxicillin-clavulanate 875-125 MG tablet Commonly known as: AUGMENTIN Take 1 tablet by mouth every 12 (twelve) hours.   aspirin EC 81 MG tablet Take 1 tablet (81 mg total) by mouth daily with breakfast.   atorvastatin 40 MG tablet Commonly known as: LIPITOR Take 1 tablet (40 mg total) by mouth daily.   dutasteride 0.5 MG capsule Commonly known as: AVODART Take 1 capsule (0.5 mg total) by mouth daily.   famotidine 20 MG tablet Commonly known as: Pepcid One after supper What changed:  how much to take how to take this when to take this   HYDROcodone-acetaminophen 5-325 MG tablet Commonly known as: NORCO/VICODIN Take 1 tablet by mouth daily as needed for moderate pain.   ipratropium 0.03 % nasal spray Commonly known as: ATROVENT Place 2 sprays into both nostrils as needed for rhinitis.   ipratropium-albuterol 0.5-2.5 (3) MG/3ML Soln Commonly known as: DUONEB Take 3 mLs by nebulization every 4 (four) hours as needed.   isosorbide mononitrate 60 MG 24 hr tablet Commonly known as: IMDUR Take 2 tablets (120 mg total) by mouth every evening.   latanoprost  0.005 % ophthalmic solution Commonly known as: XALATAN Place 1 drop into both eyes at bedtime.   losartan 25 MG tablet Commonly known as: COZAAR Take 1 tablet (25 mg total) by mouth daily.   metFORMIN 500 MG tablet Commonly known as: GLUCOPHAGE Take 500 mg by mouth 2 (two) times daily.   metoprolol succinate 25 MG 24 hr tablet Commonly known as: TOPROL-XL Take 1 tablet (25 mg total) by mouth daily. Pt. Needs to make an appt. With Cardiologist in order to receive further refills. Thank You. 1st Attempt.   nitroGLYCERIN 0.4 MG SL tablet Commonly known as: NITROSTAT Place 1 tablet (0.4 mg total) under the tongue every 5 (five) minutes x 3 doses as needed for chest pain.   OXYGEN Inhale 3 L into the lungs at bedtime.   pantoprazole 40 MG tablet Commonly known as: PROTONIX Take 1 tablet (40 mg total) by mouth daily. Take 30-60 min before first meal of the day  spironolactone 25 MG tablet Commonly known as: ALDACTONE Take 0.5 tablets (12.5 mg total) by mouth daily.   tamsulosin 0.4 MG Caps capsule Commonly known as: FLOMAX Take 1 capsule (0.4 mg total) by mouth daily after supper.   torsemide 10 MG tablet Commonly known as: DEMADEX Take 1 tablet (10 mg total) by mouth every other day. Take 20 mg for weight gain over 3 lbs in 24 hours   Vitamin D (Ergocalciferol) 1.25 MG (50000 UNIT) Caps capsule Commonly known as: DRISDOL Take 1 capsule by mouth once a week.   warfarin 2 MG tablet Commonly known as: COUMADIN Take 2 mg by mouth daily.        Allergies:  Allergies  Allergen Reactions   Niacin Itching and Rash    Burning sensation   Contrast Media [Iodinated Contrast Media] Nausea And Vomiting   Iodine-131 Nausea And Vomiting   Nsaids Other (See Comments)    Taking Coumadin    Family History: Family History  Problem Relation Age of Onset   Deep vein thrombosis Father    Lung cancer Sister    Diabetes Sister    Heart disease Sister        After age 10    Hyperlipidemia Sister    Hypertension Sister    Lung cancer Sister    Breast cancer Sister    Hypertension Mother    Diabetes Sister    Coronary artery disease Other        family hx of   Cancer Brother        "crab cancer"   Heart disease Brother    Heart attack Brother    Heart attack Daughter    Colon cancer Neg Hx    Stroke Neg Hx     Social History:  reports that he quit smoking about 3 years ago. His smoking use included cigarettes. He has a 140.00 pack-year smoking history. He has never used smokeless tobacco. He reports current alcohol use. He reports that he does not use drugs.  ROS: All other review of systems were reviewed and are negative except what is noted above in HPI  Physical Exam: BP (!) 115/57   Pulse (!) 54   Constitutional:  Alert and oriented, No acute distress. HEENT: [place] AT, moist mucus membranes.  Trachea midline, no masses. Cardiovascular: No clubbing, cyanosis, or edema. Respiratory: Normal respiratory effort, no increased work of breathing. GI: Abdomen is soft, nontender, nondistended, no abdominal masses GU: No CVA tenderness.  Lymph: No cervical or inguinal lymphadenopathy. Skin: No rashes, bruises or suspicious lesions. Neurologic: Grossly intact, no focal deficits, moving all 4 extremities. Psychiatric: Normal mood and affect.  Laboratory Data: Lab Results  Component Value Date   WBC 8.8 01/17/2022   HGB 11.3 (L) 01/17/2022   HCT 35.6 (L) 01/17/2022   MCV 89.2 01/17/2022   PLT 202 01/17/2022    Lab Results  Component Value Date   CREATININE 0.81 01/17/2022    No results found for: "PSA"  No results found for: "TESTOSTERONE"  Lab Results  Component Value Date   HGBA1C 6.9 (H) 01/02/2022    Urinalysis    Component Value Date/Time   COLORURINE RED (A) 01/02/2022 0857   APPEARANCEUR HAZY (A) 01/02/2022 0857   APPEARANCEUR Cloudy (A) 11/13/2021 1111   LABSPEC 1.005 01/02/2022 0857   PHURINE 8.0 01/02/2022 0857   GLUCOSEU  NEGATIVE 01/02/2022 0857   HGBUR MODERATE (A) 01/02/2022 0857   BILIRUBINUR NEGATIVE 01/02/2022 0857   BILIRUBINUR Negative 11/13/2021  [place] 01/02/2022 0857   PROTEINUR 30 (A) 01/02/2022 0857   NITRITE NEGATIVE 01/02/2022 0857   LEUKOCYTESUR NEGATIVE 01/02/2022 0857    Lab Results  Component Value Date   LABMICR See below: 11/13/2021   WBCUA 11-30 (A) 11/13/2021   LABEPIT 0-10 11/13/2021   MUCUS Present 10/27/2021   BACTERIA NONE SEEN 01/02/2022    Pertinent Imaging:  No results found for this or any previous visit.  No results found for this or any previous visit.  No results found for this or any previous visit.  No results found for this or any previous visit.  No results found for this or any previous visit.  No valid procedures specified. Results for orders placed during the hospital encounter of 08/18/21  CT HEMATURIA WORKUP  Narrative CLINICAL DATA:  Gross hematuria since early May, history of prostate cancer * Tracking Code: BO *  EXAM: CT ABDOMEN AND PELVIS WITHOUT AND WITH CONTRAST  TECHNIQUE: Multidetector CT imaging of the abdomen and pelvis was performed following the standard protocol before and following the bolus administration of intravenous contrast.  RADIATION DOSE REDUCTION: This exam was performed according to the departmental dose-optimization program which includes automated exposure control, adjustment of the mA and/or kV according to patient size and/or use of iterative reconstruction technique.  CONTRAST:  14m OMNIPAQUE IOHEXOL 300 MG/ML  SOLN  COMPARISON:  None Available.  FINDINGS: Lower chest: No acute abnormality. Cardiomegaly. Coronary artery calcifications.  Hepatobiliary: No solid liver abnormality is seen. Small, definitively benign cyst or hemangioma of the central liver dome for which no further follow-up or characterization is required (series 15, image 30). No gallstones, gallbladder wall  thickening, or biliary dilatation.  Pancreas: Unremarkable. No pancreatic ductal dilatation or surrounding inflammatory changes.  Spleen: Normal in size without significant abnormality.  Adrenals/Urinary Tract: Adrenal glands are unremarkable. Small simple, benign bilateral renal cortical cysts, as well as intrinsically hyperdense, nonenhancing hemorrhagic or proteinaceous cysts of the midportions of the left and right kidneys (series 2, image 37). No urinary tract filling defect on delayed phase imaging. Bladder is unremarkable.  Stomach/Bowel: Stomach is within normal limits. Appendix appears normal. No evidence of bowel wall thickening, distention, or inflammatory changes. Descending and sigmoid diverticulosis.  Vascular/Lymphatic: Aortic atherosclerosis. 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 No enlarged abdominal or pelvic lymph nodes.  Reproductive: Prostate brachytherapy.  Other: Small, fat containing bilateral inguinal hernias. No ascites.  Musculoskeletal: No acute or significant osseous findings.  IMPRESSION: 1. No evidence of urinary tract mass, calculus, or hydronephrosis. No urinary tract filling defect on delayed phase imaging. 2. Prostate brachytherapy. No evidence of mass or lymphadenopathy in the abdomen or pelvis. 3. Small simple, benign bilateral renal cortical cysts, as well as nonenhancing, benign hemorrhagic or proteinaceous cysts. No further follow-up or characterization is required for these benign renal cysts. 4. Aneurysm of the infrarenal abdominal aorta, caliber of the aortic lumen measuring up to 4.9 x 4.2 cm, additionally with a large, infrarenal left eccentric thrombosed  saccular aneurysm or pseudoaneurysm component measuring 3.4 x 3.4 cm, overall greatest dimensions of the vessel at this site 7.6 x 5.1 cm. Recommend referral to a vascular specialist. This recommendation follows ACR consensus guidelines: White Paper of  the ACR Incidental Findings Committee II on Vascular Findings. J Am Coll Radiol 2013; 10:789-794. 5. Cardiomegaly and coronary artery disease.  Aortic Atherosclerosis (ICD10-I70.0).   Electronically Signed By: Delanna Ahmadi M.D. On: 08/19/2021 11:29  No results found for this or any previous visit.   Assessment & Plan:    1. Urinary retention -Continue flomax and dutasteride - Urinalysis, Routine w reflex microscopic - BLADDER SCAN AMB NON-IMAGING  2. Gross hematuria COntinue dutasteride   3. Urinary frequency COntinue flomax and dutasteride   No follow-ups on file.  Nicolette Bang, MD  [facility] [place]

## 2022-02-07 ENCOUNTER — Ambulatory Visit: Payer: Medicare HMO | Admitting: Internal Medicine

## 2022-02-07 ENCOUNTER — Encounter: Payer: Self-pay | Admitting: Internal Medicine

## 2022-02-07 VITALS — BP 132/76 | HR 60 | Temp 98.8°F | Ht 70.0 in | Wt 202.8 lb

## 2022-02-07 DIAGNOSIS — G4734 Idiopathic sleep related nonobstructive alveolar hypoventilation: Secondary | ICD-10-CM

## 2022-02-07 DIAGNOSIS — R0609 Other forms of dyspnea: Secondary | ICD-10-CM

## 2022-02-07 NOTE — Progress Notes (Signed)
Troy [name], male    DOB: Aug 07, 1940,     MRN: [id number]   Brief patient profile:  8  yowm with known IHD quit smoking 04/1998 with onset around 2010 dysphagia and recurrent aspiration / pneumonia eval by Benjamine Mola with dx of paralyzed R VC last eval 04/12/20 doing "ok" per [facility] clinic referred to pulmonary clinic in [place]  06/07/2020 by Dr   Marigene Ehlers for recurrent asp and mild/mod Pulmonary hypertension     History of Present Illness  06/07/2020  Pulmonary/ 1st office eval/ Colby Catanese / [facility]  Chief Complaint  Patient presents with   Pulmonary Consult    Referred by Dr Loralie Champagne.  Pt c/o trouble with his breathing since Dec 2021. He states has hx of recurrent PNA and aspiration. He gets winded "walking a long distance". He also c/o cough with white sputum. He has albuterol inhaler and neb and has not been using these.   Dyspnea:  Has not done much since last admit   using walker at home and able to do [employer] shopping leaning on cart  Cough: feels something hanging in throat, worse with swallowing food/ worse in am's assoc with sense of pnds  Keeps a can by bed > white mucus  Sleep: flat bed / one pillow on side  SABA use: not really feeling like he needs it now but has duoneb and hfa  02 stopped 3 weeks prior to OV  And no worse off it "can I turn it back in"  rec Try albuterol (the puffer one day, the nebulizer the next)  15 min before an activity that you know would make you short of breath  Change the way you take your acid pills:  Pantoprazole (protonix) 40 mg   Take  30-60 min before first meal of the day and Pepcid (famotidine)  20 mg one after the last meal until return to office - this is the best way to tell whether stomach acid is contributing to your problem.   GERD diet  Discuss any sinus problems with your  ENT (ENT is a sinus doctor ) Please schedule a follow up office visit in 6 weeks, call sooner if needed    07/20/2020  f/u ov/[place] office/[name]   Chief  Complaint  Patient presents with   Follow-up    No complaints currently  Dyspnea: no change doe using saba first  Cough: sense of pnds > white mucus  Sleeping: flat bed one pillow SABA use: not now  02: not using  Covid status: x 2  Lung cancer screening: n/a  Rec You need to wear 2lpm every night to help your heart We need to  Be sure 2lpm is enough so you'll need to repeat the 02 sats on oxygen.  We will see if we can change from adapt to [place] but there are rules related to 02 equipment that everyone has to follow. Only use your albuterol as a rescue medication   02/02/2021  f/u ov/[place] office/Donata Reddick re: recurrent asp /noct hypoxemia  maint on 02 hs/ prn saba rarely   Chief Complaint  Patient presents with   Follow-up    Feels that the is the same since last visit. Uses between 2-3L continuous O2 at night time.   Dyspnea:  limited more by back and legs not breating  Cough: swallowing ok, not much cough unless dry food like crackers. Cereal  Sleeping: flat bed/ one pillow  SABA use: rarely  needed  02: 2 lpm  hs   Covid status: 4 vax  Rec No change in medications. Use more Hard rock candy (no mint/ menthol/chocalate) sugarless lifesavers/ jolley ranchers stovers or sips of water will do too to clear your throat   We will order overnight pulse oxymetry while on your usual amount of 02  Please schedule a follow up visit in 12 months but call sooner if needed     02/07/2022  f/u ov/[place] [facility]/[name] re: VCD/  asp  on prn saba Chief Complaint  Patient presents with   Follow-up    Breathing/ coughing has been doing well.  Walking with walker today due to arthritis and back pain    Dyspnea:  uses 2 wheel walker  [facility] / back slows him as much as his breathing  Cough: 1-10 x at hs mucus is white no food / doing better with swallowing  Sleeping: flat bed /  2 pillows or 45 degree doesn't help  SABA use: not using  02: 3lpm  Covid status: vax all  but the last      No obvious day to day or daytime variability or assoc excess/ purulent sputum or mucus plugs or hemoptysis or cp or chest tightness, subjective wheeze or overt sinus or hb symptoms.     Also denies any obvious fluctuation of symptoms with weather or environmental changes or other aggravating or alleviating factors except as outlined above   No unusual exposure hx or h/o childhood pna/ asthma or knowledge of premature birth.  Current Allergies, Complete Past Medical History, Past Surgical History, Family History, and Social History were reviewed in [employer] record.  ROS  The following are not active complaints unless bolded Hoarseness, sore throat, dysphagia, dental problems, itching, sneezing,  nasal congestion or discharge of excess mucus or purulent secretions, ear ache,   fever, chills, sweats, unintended wt loss or wt gain, classically pleuritic or exertional cp,  orthopnea pnd or arm/hand swelling  or leg swelling, presyncope, palpitations, abdominal pain, anorexia, nausea, vomiting, diarrhea  or change in bowel habits or change in bladder habits, change in stools or change in urine, dysuria, hematuria,  rash, arthralgias, visual complaints, headache, numbness, weakness or ataxia or problems with walking or coordination,  change in mood or  memory.        Current Meds  Medication Sig   acetaminophen (TYLENOL) 325 MG tablet Take 2 tablets (650 mg total) by mouth every 6 (six) hours as needed for mild pain, fever or headache (or Fever >/= 101).   albuterol (VENTOLIN HFA) 108 (90 Base) MCG/ACT inhaler Inhale 2 puffs into the lungs every 6 (six) hours as needed for wheezing or shortness of breath.   aspirin 81 MG EC tablet Take 1 tablet (81 mg total) by mouth daily with breakfast.   atorvastatin (LIPITOR) 40 MG tablet Take 1 tablet (40 mg total) by mouth daily.   dutasteride (AVODART) 0.5 MG capsule Take 1 capsule (0.5 mg total) by mouth daily.    famotidine (PEPCID) 20 MG tablet One after supper (Patient taking differently: Take 20 mg by mouth daily. One after supper)   HYDROcodone-acetaminophen (NORCO/VICODIN) 5-325 MG tablet Take 1 tablet by mouth daily as needed for moderate pain.   ipratropium (ATROVENT) 0.03 % nasal spray Place 2 sprays into both nostrils as needed for rhinitis.   ipratropium-albuterol (DUONEB) 0.5-2.5 (3) MG/3ML SOLN Take 3 mLs by nebulization every 4 (four) hours as needed.   isosorbide mononitrate (IMDUR) 60 MG  24 hr tablet Take 2 tablets (120 mg total) by mouth every evening.   latanoprost (XALATAN) 0.005 % ophthalmic solution Place 1 drop into both eyes at bedtime.   losartan (COZAAR) 25 MG tablet Take 1 tablet (25 mg total) by mouth daily.   metFORMIN (GLUCOPHAGE) 500 MG tablet Take 500 mg by mouth 2 (two) times daily.   metoprolol succinate (TOPROL-XL) 25 MG 24 hr tablet Take 1 tablet (25 mg total) by mouth daily. Pt. Needs to make an appt. With Cardiologist in order to receive further refills. Thank You. 1st Attempt.   nitroGLYCERIN (NITROSTAT) 0.4 MG SL tablet Place 1 tablet (0.4 mg total) under the tongue every 5 (five) minutes x 3 doses as needed for chest pain.   OXYGEN Inhale 3 L into the lungs at bedtime.   pantoprazole (PROTONIX) 40 MG tablet Take 1 tablet (40 mg total) by mouth daily. Take 30-60 min before first meal of the day   spironolactone (ALDACTONE) 25 MG tablet Take 0.5 tablets (12.5 mg total) by mouth daily.   tamsulosin (FLOMAX) 0.4 MG CAPS capsule Take 1 capsule (0.4 mg total) by mouth daily after supper.   torsemide (DEMADEX) 10 MG tablet Take 1 tablet (10 mg total) by mouth every other day. Take 20 mg for weight gain over 3 lbs in 24 hours   Vitamin D, Ergocalciferol, (DRISDOL) 1.25 MG (50000 UNIT) CAPS capsule Take 1 capsule by mouth once a week.   warfarin (COUMADIN) 2 MG tablet Take 2 mg by mouth daily.                           Past Medical History:  Diagnosis Date   AAA  (abdominal aortic aneurysm) ([place])    Followed by Dr. Sherren Mocha Early   Arthritis    CAD (coronary artery disease)    a. CABG 2000.   Cancer [facility])    Prostate:  Radiation Tx   Carotid artery disease ([place])    Chest pain    precordial. mild chronic .[name]... nonischemic   CHF (congestive heart failure) ([facility])    Chronic edema    Coronary artery disease    a. Nuclear, January, 2008, no ischemia b. Cath 08/2012- 1/4 patent grafts, RCA CTO, no flow-limiting disease, medically managed   Diabetes mellitus without complication ([place])    Dizziness 02/2011   Dyslipidemia    GERD (gastroesophageal reflux disease)    TAKES TUMS & ROLAIDS AS NEEDED   Hx of CABG 2000   Hypertension    Myocardial infarction ([place]) 1999   Neck pain 02/2011   Pneumonia    Pulmonary hypertension ([place])    Renal artery stenosis ([facility])    50-70%   S/P femoropopliteal bypass surgery    Dr. Donnetta Hutching   Sinus bradycardia    Asymptomatic      Objective:    Wts  02/07/2022       202   02/02/2021       207   07/20/20 205 lb 3.2 oz (93.1 kg)  06/07/20 205 lb 3.2 oz (93.1 kg)  05/25/20 205 lb (93 kg)    Vital signs reviewed  02/07/2022  - Note at rest 02 sats  97% on RA   General appearance:    hoarse amb elderly wm/ walks with walker  /congested sounding cough     HEENT : Oropharynx  clear/edentulous     Nasal turbinates nl    NECK :  without  apparent  JVD/ palpable Nodes/TM    LUNGS: no acc muscle use,  Nl contour chest with scattered coarse bilateral insp/exp rhonchi with transmitted upper airway noise but  without cough on insp or exp maneuvers   CV:  RRR  no s3 or murmur - min increase in P2 / trace pitting edema both ankles    ABD:  soft and nontender with nl inspiratory excursion in the supine position. No bruits or organomegaly appreciated   MS:  can barely get  out of chair due to back pain / ext warm without deformities Or obvious joint restrictions  calf tenderness, cyanosis or clubbing    SKIN: warm and dry  without lesions    NEURO:  alert, approp, nl sensorium with  no motor or cerebellar deficits apparent.          Assessment

## 2022-02-07 NOTE — Assessment & Plan Note (Signed)
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 - [facility]  04/11/20 RA mean 6 RV 64/6 PA 65/18, mean 35 PCWP mean 14 Ao 112/45 Oxygen saturations: PA 62% AO 100% Cardiac Output (Fick) 5.71  Cardiac Index (Fick) 2.71 PVR 3.7 WU - PFT's  05/05/20  FEV1 2.87 (94 % ) ratio 0.76  p 0 % improvement from saba p o prior to study with DLCO  13.20 (53%) corrects to 2.46 (62 %)  for alv volume and FV curve   -  06/07/2020   Walked RA  approx   300 ft  @ slow to moderate pace  stopped due  to hip pain/no SOB with sats 93% at end    - ONO RA 06/16/20  desats x 4 h on RA > rec 2lpm and repeat (see noct hypoxemia)  - 02/02/2021 pt walked at a slow pace on room air. Reported SOB on the second lap. Stopped walk after second lap due to legs tired with lowest sats 96%  - 02/07/2022   Walked on RA  x  one  lap(s) =  approx 150  ft  @ slow/walker  pace, stopped due to back pain with lowest 02 sats 95%    Clearly more limited by back than breathing with main concern making sure he does not suffer from recurrent aspiration injury at this point/ advised.

## 2022-02-07 NOTE — Assessment & Plan Note (Signed)
ONO RA 06/16/20  desats x 4 h on RA > rec 2lpm and repeat - 07/20/2020 not using 02 > rec take it and recheck ONO on 2lpm to assure adequate noct sats > did not do - sleep study done  11/13/20 mild osa with desats > rec repeat ono on 2lpm > done 07/21/21 and on 2lpm and still totaled 1h  27 min < 89% so rec repeat on 3lpm 07/26/2021 >>> 08/03/21  Still desat 59 min but record says 2lpm - regardless rec increase by 1lpm and repeat > done 08/27/21 and sats ok on 3lpm > continue 3lpm  - 02/07/2022 no desats walking slow pace due to back pain  No need for amb 02, continue 3lpm hs and f/u yearly, sooner prn    Each maintenance medication was reviewed in detail including emphasizing most importantly the difference between maintenance and prns and under what circumstances the prns are to be triggered using an action plan format where appropriate.  Total time for H and P, chart review, counseling,  directly observing portions of ambulatory 02 saturation study/ and generating customized AVS unique to this office visit / same day charting = 27 min

## 2022-02-07 NOTE — Patient Instructions (Signed)
No change in recommendations  Please schedule a follow up visit in 12 months but call sooner if needed

## 2022-02-19 ENCOUNTER — Ambulatory Visit (INDEPENDENT_AMBULATORY_CARE_PROVIDER_SITE_OTHER): Payer: No Typology Code available for payment source | Admitting: Physician Assistant

## 2022-02-19 VITALS — BP 133/63 | HR 75

## 2022-02-19 DIAGNOSIS — R310 Gross hematuria: Secondary | ICD-10-CM | POA: Diagnosis not present

## 2022-02-19 DIAGNOSIS — Z8603 Personal history of neoplasm of uncertain behavior: Secondary | ICD-10-CM

## 2022-02-19 DIAGNOSIS — Z9889 Other specified postprocedural states: Secondary | ICD-10-CM

## 2022-02-19 DIAGNOSIS — R3911 Hesitancy of micturition: Secondary | ICD-10-CM

## 2022-02-19 LAB — URINALYSIS, ROUTINE W REFLEX MICROSCOPIC
Bilirubin, UA: NEGATIVE
Glucose, UA: NEGATIVE
Ketones, UA: NEGATIVE
Leukocytes,UA: NEGATIVE
Nitrite, UA: NEGATIVE
Protein,UA: NEGATIVE
Specific Gravity, UA: 1.015 (ref 1.005–1.030)
Urobilinogen, Ur: 0.2 mg/dL (ref 0.2–1.0)
pH, UA: 7 (ref 5.0–7.5)

## 2022-02-19 LAB — MICROSCOPIC EXAMINATION
Bacteria, UA: NONE SEEN
Epithelial Cells (non renal): NONE SEEN /hpf (ref 0–10)
WBC, UA: NONE SEEN /hpf (ref 0–5)

## 2022-02-19 LAB — BLADDER SCAN AMB NON-IMAGING: Scan Result: 118

## 2022-02-19 NOTE — Progress Notes (Unsigned)
Simple Catheter Placement  Due to urinary retention patient is present today for a foley cath placement.  Patient was cleaned and prepped in a sterile fashion with betadine. A 20 FR  coude foley catheter was inserted, urine return was noted  350m, urine was yellow in color.  The balloon was filled with 10cc of sterile water.  A bedside bag was attached for drainage.  Patient was given instruction on proper catheter care.  Patient tolerated well, no complications were noted   Performed by: Avin Gibbons LPN  Additional notes/ Follow up:  Per PA note

## 2022-02-19 NOTE — Progress Notes (Unsigned)
Assessment: There are no diagnoses linked to this encounter.   Plan: ***  No orders of the defined types were placed in this encounter.    Chief Complaint: No chief complaint on file.   HPI: Troy [name] is a 81 y.o. male with history of urinary retention post TURBT who presents for continued evaluation of recurrence of gross hematuria and inability to void since late last night. Pt had recurrence of clot passage 3 nights ago with some difficulty voiding.  Intermittent gross hematuria since passage of one large clot until last night.  Current complaint of discomfort with urge to void.  No fever, chills, nausea vomiting.  Most recent culture on 10/18 80,000 colonies of Enterococcus faecalis and 80,000 colonies of group B strep sensitivities reported to ampicillin, nitrofurantoin, vancomycin.  Culture on 01/02/2022 = no growth. Cysto on 10/23, voiding trial on 10/25.  Patient remains anticoagulated on Coumadin.  UA= PVR= IPSS=           QOL=  02/02/22 Troy [name] is a 80yo here for followup for urinary retention and gross hematuria. He passed a clot this week and then his urine has been clear. IPSS 12 QOL 2 on flomax and dutasteride. NO gross hematuria in a week. PVR 141cc. No other comaplitns today   Portions of the above documentation were copied from a prior visit for review purposes only.  Allergies: Allergies  Allergen Reactions   Niacin Itching and Rash    Burning sensation   Contrast Media [Iodinated Contrast Media] Nausea And Vomiting   Iodine-131 Nausea And Vomiting   Nsaids Other (See Comments)    Taking Coumadin    PMH: Past Medical History:  Diagnosis Date   AAA (abdominal aortic aneurysm) (HCC)    Followed by Dr. Sherren Mocha Early   Arthritis    CAD (coronary artery disease)    a. CABG 2000.   Cancer [facility])    Prostate:  Radiation Tx   Carotid artery disease ([place])    Chest pain    precordial. mild chronic .[name]... nonischemic   CHF (congestive heart failure) (HCC)     Chronic edema    Coronary artery disease    a. Nuclear, January, 2008, no ischemia b. Cath 08/2012- 1/4 patent grafts, RCA CTO, no flow-limiting disease, medically managed   Diabetes mellitus without complication ([place])    Dizziness 02/2011   Dyslipidemia    Fall    GERD (gastroesophageal reflux disease)    TAKES TUMS & ROLAIDS AS NEEDED   Hx of CABG 2000   Hypertension    Myocardial infarction ([place]) 1999   Neck pain 02/2011   Pneumonia    Pulmonary hypertension ([place])    Renal artery stenosis (HCC)    50-70%   S/P femoropopliteal bypass surgery    Dr. Donnetta Hutching   Sinus bradycardia    Asymptomatic    PSH: Past Surgical History:  Procedure Laterality Date   CARDIAC CATHETERIZATION  09/26/2012   1/4 patent bypass (occluded SVG-PDA, SVG-OM, LIMA-LAD), SVG-diagonal patent and fills the diagonal and LAD, distal RCA occlusion with left to right collateralization, patent circumflex, LAD with no flow-limiting disease and antegrade flow competitively from SVG-diagonal; EF 60-65%   COLONOSCOPY  11/30/2009   NOB:[id number]. next TCS 11/2019   COLONOSCOPY WITH PROPOFOL N/A 12/18/2019   Procedure: COLONOSCOPY WITH PROPOFOL;  Surgeon: Harvel Quale, MD;  [facility];  Service: Gastroenterology;  Laterality: N/A;  1030   CORONARY ARTERY BYPASS GRAFT  2000   CYSTOSCOPY  N/A 08/31/2021   Procedure: CYSTOSCOPY;  Surgeon: Cleon Gustin, MD;  Location: [facility] OR[facility];  Service: Urology;  Laterality: N/A;  pt knows to arrive at 7:00   ESOPHAGOGASTRODUODENOSCOPY N/A 08/12/2013   TFT:[id number] peptic stricture with erosive refluxesophagitis - status post Maloney dilation. Hiatal hernia. Abnormalgastric mucosa. Deformity of the pyloric channel suggestive ofprior peptic ulcer disease. Duodenal bulbar diverticulum Statuspost gastric biopsy. h.pylori   LEFT HEART CATHETERIZATION WITH CORONARY/GRAFT ANGIOGRAM N/A 09/26/2012   Procedure: LEFT HEART CATHETERIZATION WITH Beatrix Fetters;  Surgeon: Burnell Blanks, MD;  Location: [facility] CATH LAB;  Service: Cardiovascular;  Laterality: N/A;   MALONEY DILATION N/A 08/12/2013   Procedure: Venia Minks DILATION;  Surgeon: Daneil Dolin, MD;  Location: [facility];  Service: Endoscopy;  Laterality: N/A;   POLYPECTOMY  12/18/2019   Procedure: POLYPECTOMY;  Surgeon: Harvel Quale, MD;  Location: [facility];  Service: Gastroenterology;;  ascending colon polyp    PR VEIN BYPASS GRAFT,AORTO-FEM-POP Right 07/19/1999   PR VEIN BYPASS GRAFT,AORTO-FEM-POP Left 05/02/2006   RIGHT HEART CATH AND CORONARY/GRAFT ANGIOGRAPHY N/A 04/11/2020   Procedure: RIGHT HEART CATH AND CORONARY/GRAFT ANGIOGRAPHY;  Surgeon: Larey Dresser, MD;  Location: [place] CV LAB;  Service: Cardiovascular;  Laterality: N/A;   SAVORY DILATION N/A 08/12/2013   Procedure: SAVORY DILATION;  Surgeon: Daneil Dolin, MD;  Location[facility];  Service: Endoscopy;  Laterality: N/A;   TRANSURETHRAL RESECTION OF BLADDER TUMOR N/A 08/31/2021   Procedure: TRANSURETHRAL RESECTION OF BLADDER TUMOR (TURBT);  Surgeon: Cleon Gustin, MD;  Location: [facility];  Service: Urology;  Laterality: N/A;   WRIST SURGERY     cyst removal    SH: Social History   Tobacco Use   Smoking status: Former    Packs/day: 2.00    Years: 70.00    Total pack years: 140.00    Types: Cigarettes    Quit date: 04/21/2018    Years since quitting: 3.8   Smokeless tobacco: Never  Vaping Use   Vaping Use: Never used  Substance Use Topics   Alcohol use: Yes    Alcohol/week: 0.0 - 2.0 standard drinks of alcohol    Comment: OCCASIONAL   Drug use: Never    ROS: All other review of systems were reviewed and are negative except what is noted above in HPI  PE: There were no vitals taken for this visit. GENERAL APPEARANCE:  Well appearing, well developed, well nourished, NAD HEENT:  Atraumatic, normocephalic NECK:  Supple. Trachea midline ABDOMEN:  Soft, non-tender, no  masses EXTREMITIES:  Moves all extremities well, without clubbing, cyanosis, or edema NEUROLOGIC:  Alert and oriented x 3, normal gait, CN II-XII grossly intact MENTAL STATUS:  appropriate BACK:  Non-tender to palpation, No CVAT SKIN:  Warm, dry, and intact   Results: Laboratory Data: Lab Results  Component Value Date   WBC 8.8 01/17/2022   HGB 11.3 (L) 01/17/2022   HCT 35.6 (L) 01/17/2022   MCV 89.2 01/17/2022   PLT 202 01/17/2022    Lab Results  Component Value Date   CREATININE 0.81 01/17/2022    No results found for: "PSA"  No results found for: "TESTOSTERONE"  Lab Results  Component Value Date   HGBA1C 6.9 (H) 01/02/2022    Urinalysis    Component Value Date/Time   COLORURINE RED (A) 01/02/2022 0857   APPEARANCEUR Clear 02/02/2022 1056   LABSPEC 1.005 01/02/2022 0857   PHURINE 8.0 01/02/2022 0857   GLUCOSEU Negative 02/02/2022 1056  HGBUR MODERATE (A) 01/02/2022 0857   BILIRUBINUR Negative 02/02/2022 [place] 01/02/2022 0857   PROTEINUR Negative 02/02/2022 1056   PROTEINUR 30 (A) 01/02/2022 0857   NITRITE Negative 02/02/2022 1056   NITRITE NEGATIVE 01/02/2022 0857   LEUKOCYTESUR Negative 02/02/2022 1056   LEUKOCYTESUR NEGATIVE 01/02/2022 0857    Lab Results  Component Value Date   LABMICR See below: 02/02/2022   WBCUA None seen 02/02/2022   LABEPIT 0-10 02/02/2022   MUCUS Present 10/27/2021   BACTERIA None seen 02/02/2022    Pertinent Imaging: No results found for this or any previous visit.  No results found for this or any previous visit.  No results found for this or any previous visit.  No results found for this or any previous visit.  No results found for this or any previous visit.  No valid procedures specified. Results for orders placed during the hospital encounter of 08/18/21  CT HEMATURIA WORKUP  Narrative CLINICAL DATA:  Gross hematuria since early May, history of prostate cancer * Tracking Code: BO  *  EXAM: CT ABDOMEN AND PELVIS WITHOUT AND WITH CONTRAST  TECHNIQUE: Multidetector CT imaging of the abdomen and pelvis was performed following the standard protocol before and following the bolus administration of intravenous contrast.  RADIATION DOSE REDUCTION: This exam was performed according to the departmental dose-optimization program which includes automated exposure control, adjustment of the mA and/or kV according to patient size and/or use of iterative reconstruction technique.  CONTRAST:  158m OMNIPAQUE IOHEXOL 300 MG/ML  SOLN  COMPARISON:  None Available.  FINDINGS: Lower chest: No acute abnormality. Cardiomegaly. Coronary artery calcifications.  Hepatobiliary: No solid liver abnormality is seen. Small, definitively benign cyst or hemangioma of the central liver dome for which no further follow-up or characterization is required (series 15, image 30). No gallstones, gallbladder wall thickening, or biliary dilatation.  Pancreas: Unremarkable. No pancreatic ductal dilatation or surrounding inflammatory changes.  Spleen: Normal in size without significant abnormality.  Adrenals/Urinary Tract: Adrenal glands are unremarkable. Small simple, benign bilateral renal cortical cysts, as well as intrinsically hyperdense, nonenhancing hemorrhagic or proteinaceous cysts of the midportions of the left and right kidneys (series 2, image 37). No urinary tract filling defect on delayed phase imaging. Bladder is unremarkable.  Stomach/Bowel: Stomach is within normal limits. Appendix appears normal. No evidence of bowel wall thickening, distention, or inflammatory changes. Descending and sigmoid diverticulosis.  Vascular/Lymphatic: Aortic atherosclerosis. 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 No enlarged abdominal or pelvic lymph nodes.  Reproductive: Prostate brachytherapy.  Other: Small, fat containing bilateral inguinal hernias. No ascites.  Musculoskeletal: No acute or significant osseous findings.  IMPRESSION: 1. No evidence of urinary tract mass, calculus, or hydronephrosis. No urinary tract filling defect on delayed phase imaging. 2. Prostate brachytherapy. No evidence of mass or lymphadenopathy in the abdomen or pelvis. 3. Small simple, benign bilateral renal cortical cysts, as well as nonenhancing, benign hemorrhagic or proteinaceous cysts. No further follow-up or characterization is required for these benign renal cysts. 4. 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 Recommend referral to a vascular specialist. This recommendation follows ACR consensus guidelines: White Paper of the ACR Incidental Findings Committee II on Vascular Findings. J Am Coll Radiol 2013; 10:789-794. 5. Cardiomegaly and coronary artery disease.  Aortic Atherosclerosis (ICD10-I70.0).   Electronically Signed By: Delanna Ahmadi M.D. On: 08/19/2021 11:29  No results found for this or any previous visit.  No results found for this or any previous visit (from the past 24 hour(s)).

## 2022-02-19 NOTE — Progress Notes (Unsigned)
Pt here today for bladder scan. Bladder was scanned and 118 was visualized.    Performed by Marisue Brooklyn, CMA  Additional follow up Follow up as scheduled

## 2022-02-20 ENCOUNTER — Encounter: Payer: Self-pay | Admitting: Physician Assistant

## 2022-02-26 ENCOUNTER — Telehealth: Payer: Self-pay

## 2022-02-26 NOTE — Telephone Encounter (Signed)
Patient needing a call back regarding continuance of blood clots in catheter.   Also wanting to know what Dr. [name] advises from discussion with Roney Marion visit.  Please advise asap.  Call back [phone]  Thanks, Helene Kelp

## 2022-02-27 DIAGNOSIS — Z79899 Other long term (current) drug therapy: Secondary | ICD-10-CM | POA: Diagnosis not present

## 2022-02-27 NOTE — Telephone Encounter (Signed)
Spoke to patient, the blood clots have since cleared up and his urine is clear.  No issues with the catheter draining.  Patient is asking if you reviewed his POC with Dr. [name] and if you had an update on his next steps.

## 2022-02-28 ENCOUNTER — Telehealth: Payer: Self-pay

## 2022-02-28 NOTE — Telephone Encounter (Signed)
Patient called and advised he wished to proceed with surgery rather another voiding trial. Patient was made aware that he would be contacted regarding the day and time of his surgery. Patient voiced understanding.

## 2022-03-01 DIAGNOSIS — N182 Chronic kidney disease, stage 2 (mild): Secondary | ICD-10-CM | POA: Diagnosis not present

## 2022-03-01 DIAGNOSIS — I129 Hypertensive chronic kidney disease with stage 1 through stage 4 chronic kidney disease, or unspecified chronic kidney disease: Secondary | ICD-10-CM | POA: Diagnosis not present

## 2022-03-01 DIAGNOSIS — E1122 Type 2 diabetes mellitus with diabetic chronic kidney disease: Secondary | ICD-10-CM | POA: Diagnosis not present

## 2022-03-02 NOTE — Telephone Encounter (Signed)
Surgical posting needed please

## 2022-03-04 IMAGING — CT CT CHEST W/O CM
2 of 4 series · 15 of 36 positions shown, 18 images · non-contrast
Comparison: CT chest angio dated March 26, 2020

CLINICAL DATA: Enlarged lymph nodes

EXAM:
CT CHEST WITHOUT CONTRAST
TECHNIQUE: Multidetector CT imaging of the chest was performed following the
standard protocol without IV contrast.

[Series 2: routine chest without · axial · non-contrast · 0.91mm/px · z∈[+1113,+1385]mm · 12 of 162 slices shown, 15 images]
[im 13/162  mediastinal]
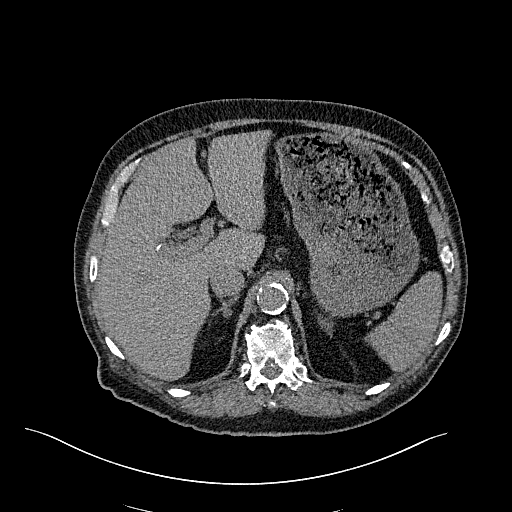
[im 13/162  lung]
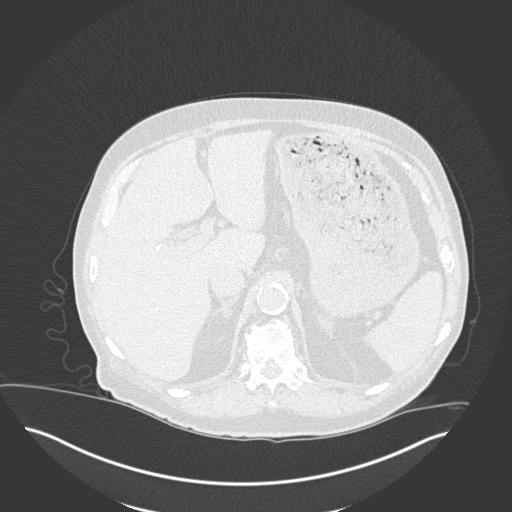
[im 25/162  lung]
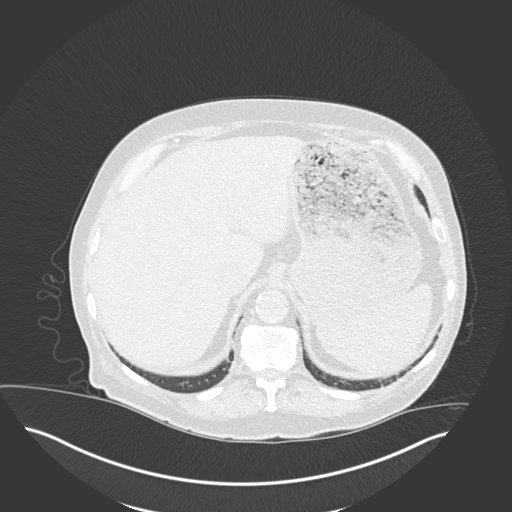
[im 38/162  lung]
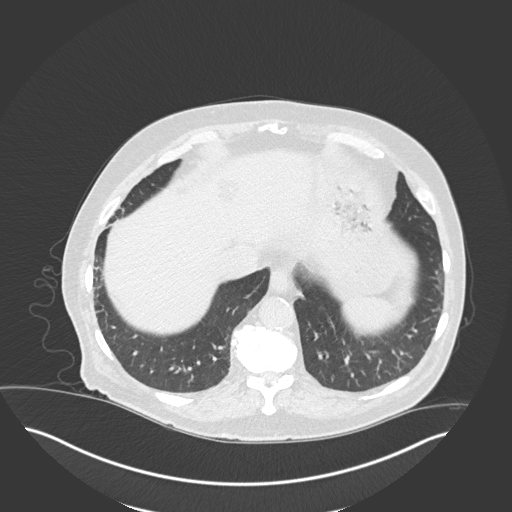
[im 50/162  lung]
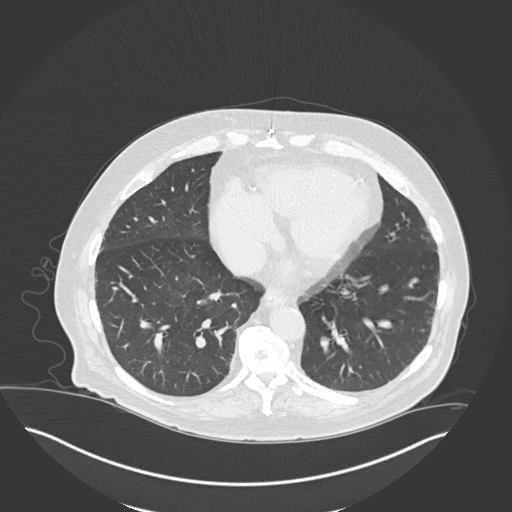
[im 62/162  mediastinal]
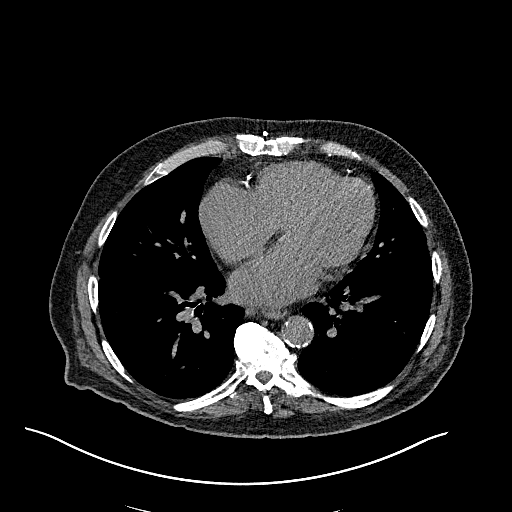
[im 62/162  lung]
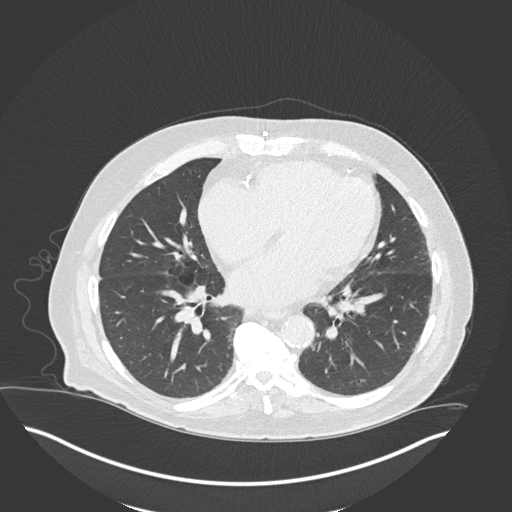
[im 75/162  lung]
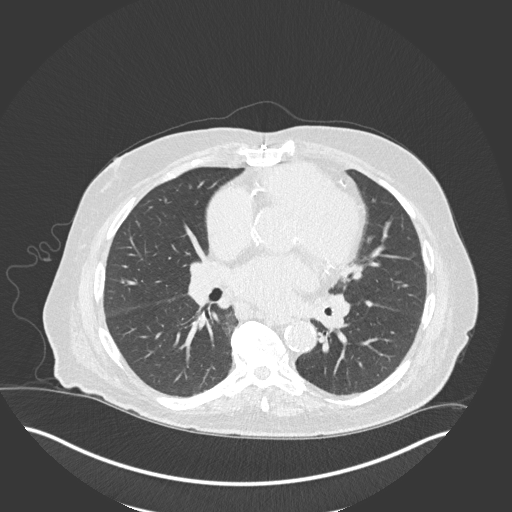
[im 87/162  lung]
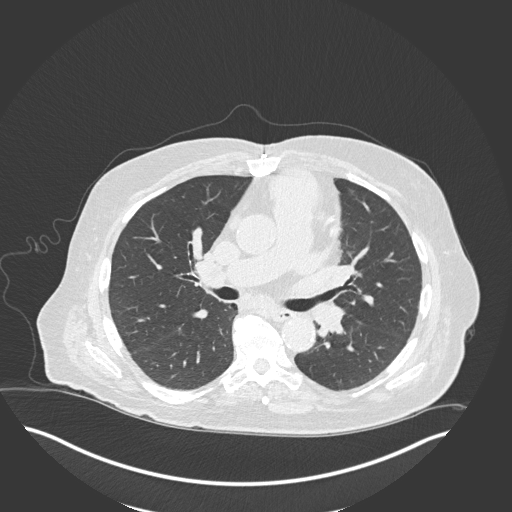
[im 100/162  lung]
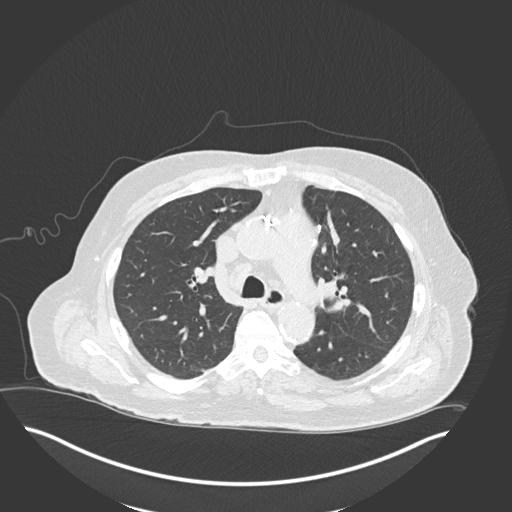
[im 112/162  mediastinal]
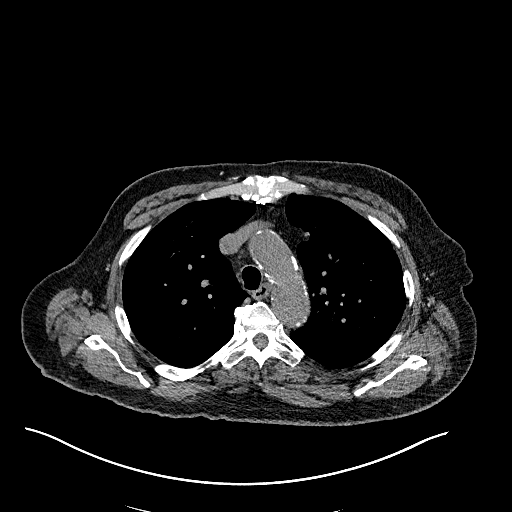
[im 112/162  lung]
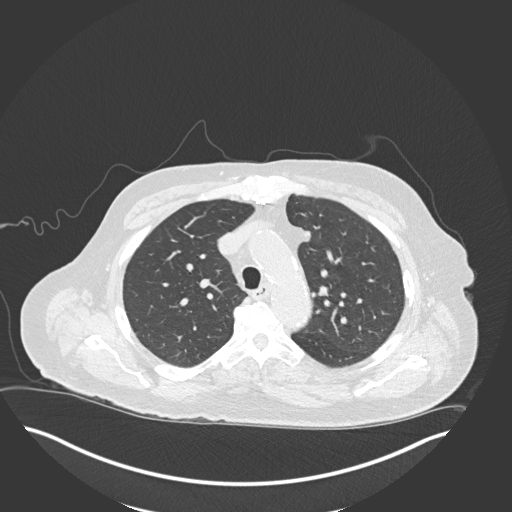
[im 124/162  lung]
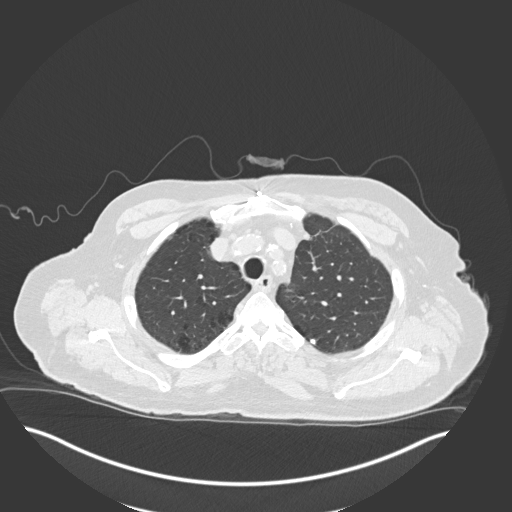
[im 137/162  lung]
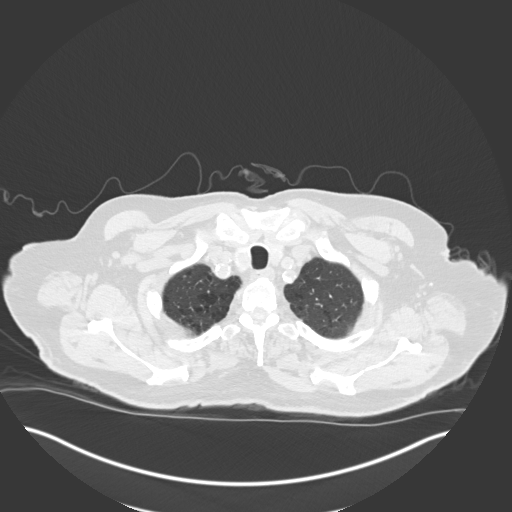
[im 149/162  lung]
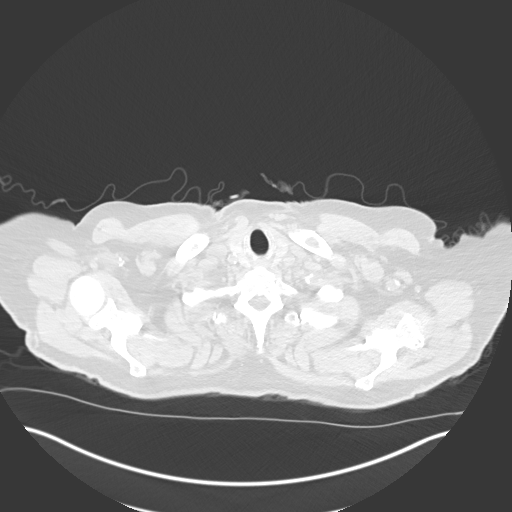

[Series 5: coronal · coronal · 0.63mm/px · 3 of 151 slices shown]
[im 31/151  lung]
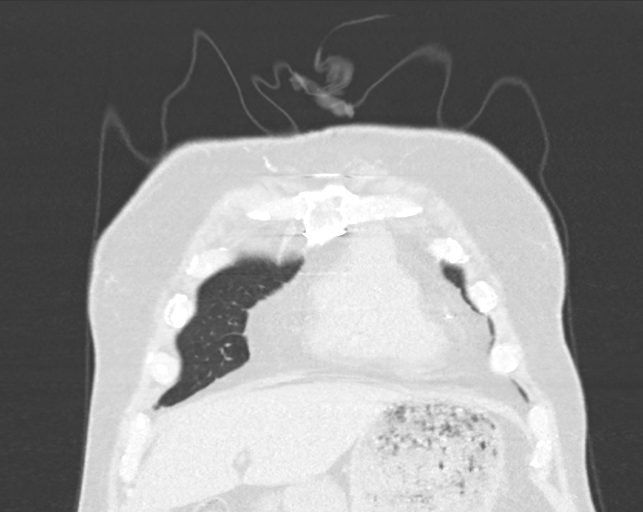
[im 61/151  lung]
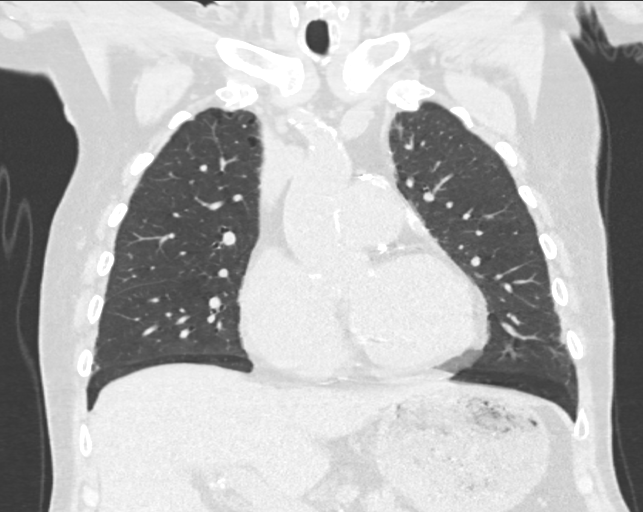
[im 91/151  lung]
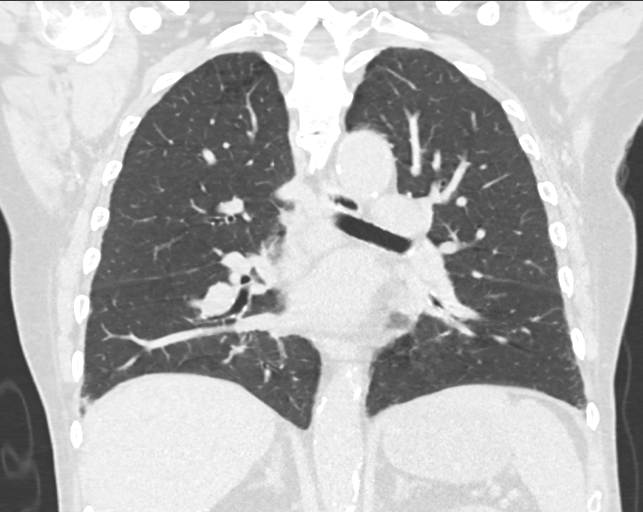

[15 of 36 positions shown; findings below may reference images not displayed]

FINDINGS: Cardiovascular: Cardiomegaly. No pericardial effusion. Left main and
three-vessel coronary artery calcifications status post CABG.
Atherosclerotic disease of the thoracic aorta.

Mediastinum/Nodes: Decreased hilar and mediastinal adenopathy.
Reference right lower paratracheal lymph node measuring 1.2 cm,
previously measured 1.7 cm on series 2, image 59.

Lungs/Pleura: Central airways are patent. Mild upper lobe
predominant centrilobular and paraseptal emphysema. No
consolidation, pleural effusion or pneumothorax. Calcified pulmonary
nodule the left upper lobe. No suspicious pulmonary nodules.

Upper Abdomen: Low-attenuation lesion of the left lobe of the liver
with associated calcification, unchanged in size when compared with
prior exam. No acute abnormality.

Musculoskeletal: Prior median sternotomy with intact sternal wires.
Multilevel degenerative disc disease. No aggressive osseous lesions.
IMPRESSION: 1. No acute pulmonary parenchymal findings.
2. Decreased mediastinal and hilar adenopathy when compared with
prior exam.
3. Coronary artery calcifications status post CABG.
4. Aortic Atherosclerosis ([id number]) and Emphysema ([id number]).

## 2022-03-05 ENCOUNTER — Ambulatory Visit: Payer: Medicare HMO

## 2022-03-12 ENCOUNTER — Telehealth: Payer: Self-pay

## 2022-03-12 NOTE — Progress Notes (Signed)
PCP: Sharilyn Sites, MD HF Cardiology: Dr. Aundra Dubin  81 y.o. with history of permanent atrial fibrillation, CAD s/p CABG 2000, PAD, chronic diastolic CHF was referred to CHF clinic by Melina Copa, PA, for evaluation of CHF and pulmonary hypertension.  Patient had CABG in 2000, last cath was in 2014 showing occluded SVG-OM, occluded SVG-PDA, occluded RCA, and atretic LIMA.  SVG-D was patent and native LAD was patent.  Patient had bilateral fem-pop bypasses, peripheral dopplers in 7/21 showed that the right fem-pop was occluded.  He is in permanent atrial fibrillation on warfarin. Echo in 10/21 showed normal LV systolic function with moderately dilated/mildly dysfunctional RV and PASP 75 mmHg.  In 12/21, he was admitted with PNA and treated with antibiotics.  CTA chest showed no PE, RML PNA, and bilateral hilar + mediastinal lymphadenopathy.  He was sent home from this admission on home oxygen.   In 1/22, RHC/LHC was done.  This showed patent LIMA-LAD and SVG-D, occluded SVG-OM and occluded SVG-RCA, 95% calcified proximal RCA stenosis, totally occluded PLV with collaterals (known from prior).  There was moderate pulmonary hypertension with optimized left and right heart filling pressures.  I reviewed the cath with interventional cardiology, would require atherectomy to treat the RCA.  We decided to manage medically initially.   He stopped Jardiance due to penile yeast infection.   Follow up 1/23. Torsemide 10 mg daily resumed, echo arranged to follow RV.   Echo 4/23 showed EF 55-60%, mild LVH, RV mildly reduced.  Follow up 10-/23 Today he returns for HF follow up. He has had recent hospitalizations with UTIs and hematuria.  He sees urology. He was in rehab for a time period but is now home.  He uses a walker for stability.  No exertional dyspnea or chest pain. No lightheadedness.  BP elevated today but has not taken his meds yet.  Weight down 4 lbs.   Admitted 10/23 with hematuria and inability to urinate.  Urology consulted, foley placed and discharged home. Seen in the ED a week later with hematuria. Urology consulted and arranged traction on foley to help assist with tamponade. [place] held.  Today he returns for HF follow up. Overall feeling fine. No SOB walking with rolling walker. Mainly physically limited by foley catheter. Denies abnormal bleeding, CP, dizziness, edema, or PND/Orthopnea. Appetite ok. No fever or chills. Weight at home 198-200 pounds. Taking all medications. Took extra torsemide last week for weight gain. Has HH PT 2x/week, wears 3L oxygen at night.   Labs (12/21): K 3.8, creatinine 0.83, hgb 9.5 Labs (1/22): K 3.8, creatinine 1.22, LDL 49 Labs (2/22): K 3.6, creatinine 0.86 Labs (10/22): K 3.7, creatinine 0.75, BNP 153 Labs (1/23): LDL 62, HDL 37 Labs (9/23): K 3.6, creatinine 0.9, hgb 11.4 Labs (10/23): K 3.5, creatinine 0.81  ECG (personally reviewed): none ordered today.  PMH: 1. Atrial fibrillation: Permanent.  2. CAD: CABG 2000.  - LHC (2014) with totally occluded RCA, totally occluded SVG-RCA, totally occluded SVG-OM, atretic LIMA, patent SVG-D.   - LHC (1/22): patent LIMA-LAD and SVG-D, occluded SVG-OM and occluded SVG-RCA, 60-70% calcified ostial RCA stenosis and 95% calcified proximal RCA stenosis, totally occluded PLV with collaterals (known from prior). 3. HTN 4. PAD: s/p bilateral fem-pop bypasses.   - Peripheral arterial dopplers (7/21): Occluded right fem-pop, patent left fem-pop.  5. Type 2 diabetes 6. Prostate cancer: Treated with radiation. 7. PVCs 8. History of renal artery stenosis.  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  10.  Chronic diastolic CHF: Echo (34/19) with EF 60-65%, mildly decreased RV systolic function with moderate RV enlargement, severe biatrial enlargement, PASP 75 mmHg, dilated IVC.  V/Q scan 7/22 with no chronic PE.  - RHC (1/22): mean RA 6, PA 65/18 mean 35, mean PCWP 14, CI 2.71, PVR 3.7 WU.  - Echo  (4/23): EF 55-60%, mild LVH, RV mildly reduced, severely elevated pulmonary artery systolic pressure 11. 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  - Abdominal US (8/22): 4.8 cm AAA - CT abd/pelvis (5/23): 4.8 cm AAA 12. Carotid stenosis: Carotid dopplers (3/79) with 02-40% LICA stenosis.  - Carotid dopplers (9/73): 53-29% LICA stenosis.  14. BPH  SH: Lives in [place] with wife.  Retired.  Remote smoker (>20 years ago). No ETOH.   Family History  Problem Relation Age of Onset   Deep vein thrombosis Father    Lung cancer Sister    Diabetes Sister    Heart disease Sister        After age 16   Hyperlipidemia Sister    Hypertension Sister    Lung cancer Sister    Breast cancer Sister    Hypertension Mother    Diabetes Sister    Coronary artery disease Other        family hx of   Cancer Brother        "crab cancer"   Heart disease Brother    Heart attack Brother    Heart attack Daughter    Colon cancer Neg Hx    Stroke Neg Hx    ROS: All systems reviewed and negative except as per HPI.   Current Outpatient Medications  Medication Sig Dispense Refill   acetaminophen (TYLENOL) 325 MG tablet Take 2 tablets (650 mg total) by mouth every 6 (six) hours as needed for mild pain, fever or headache (or Fever >/= 101). 12 tablet 0   albuterol (VENTOLIN HFA) 108 (90 Base) MCG/ACT inhaler Inhale 2 puffs into the lungs every 6 (six) hours as needed for wheezing or shortness of breath. 1 each 2   aspirin 81 MG EC tablet Take 1 tablet (81 mg total) by mouth daily with breakfast. 30 tablet 3   atorvastatin (LIPITOR) 40 MG tablet Take 1 tablet (40 mg total) by mouth daily. 90 tablet 1   dutasteride (AVODART) 0.5 MG capsule Take 1 capsule (0.5 mg total) by mouth daily. 30 capsule 11   famotidine (PEPCID) 20 MG tablet One after supper (Patient taking differently: Take 20 mg by mouth daily. One after supper) 30 tablet 11   HYDROcodone-acetaminophen (NORCO/VICODIN) 5-325 MG tablet Take 1 tablet by mouth daily  as needed for moderate pain. 30 tablet 0   ipratropium (ATROVENT) 0.03 % nasal spray Place 2 sprays into both nostrils as needed for rhinitis.     isosorbide mononitrate (IMDUR) 60 MG 24 hr tablet Take 2 tablets (120 mg total) by mouth every evening. 90 tablet 6   latanoprost (XALATAN) 0.005 % ophthalmic solution Place 1 drop into both eyes at bedtime.     losartan (COZAAR) 25 MG tablet Take 1 tablet (25 mg total) by mouth daily. 30 tablet 4   metFORMIN (GLUCOPHAGE) 500 MG tablet Take 500 mg by mouth 2 (two) times daily.     metoprolol succinate (TOPROL-XL) 25 MG 24 hr tablet Take 1 tablet (25 mg total) by mouth daily. Pt. Needs to make an appt. With Cardiologist in order to receive further refills. Thank You. 1st Attempt. 30 tablet 0   OXYGEN  Inhale 3 L into the lungs at bedtime.     pantoprazole (PROTONIX) 40 MG tablet Take 1 tablet (40 mg total) by mouth daily. Take 30-60 min before first meal of the day     spironolactone (ALDACTONE) 25 MG tablet Take 0.5 tablets (12.5 mg total) by mouth daily. 45 tablet 3   tamsulosin (FLOMAX) 0.4 MG CAPS capsule Take 1 capsule (0.4 mg total) by mouth daily after supper. 90 capsule 3   torsemide (DEMADEX) 10 MG tablet Take 10 mg by mouth daily. 20 mg as needed for weight gain     Vitamin D, Ergocalciferol, (DRISDOL) 1.25 MG (50000 UNIT) CAPS capsule Take 1 capsule by mouth once a week.     warfarin (COUMADIN) 2 MG tablet Take 2 mg by mouth daily.     nitroGLYCERIN (NITROSTAT) 0.4 MG SL tablet Place 1 tablet (0.4 mg total) under the tongue every 5 (five) minutes x 3 doses as needed for chest pain. (Patient not taking: Reported on 03/16/2022) 25 tablet 3   No current facility-administered medications for this encounter.   Wt Readings from Last 3 Encounters:  03/16/22 93.9 kg (207 lb)  02/07/22 92 kg (202 lb 12.8 oz)  01/29/22 88 kg (194 lb)   BP (!) 148/62   Pulse (!) 52   Wt 93.9 kg (207 lb)   SpO2 98%   BMI 29.70 kg/m  Physical Exam General:  NAD.  No resp difficulty, walked into clinic with rolling walker HEENT: Normal Neck: Supple. No JVD. Carotids 2+ bilat; no bruits. No lymphadenopathy or thryomegaly appreciated. Cor: PMI nondisplaced. Irregular rate & rhythm. No rubs, gallops or murmurs. Lungs: Clear Abdomen: Soft, nontender, nondistended. No hepatosplenomegaly. No bruits or masses. Good bowel sounds. Foley catheter with clear/yellow urine Extremities: No cyanosis, clubbing, rash, 1+ BLE edema Neuro: Alert & oriented x 3, cranial nerves grossly intact. Moves all 4 extremities w/o difficulty. Affect pleasant.  Assessment/Plan: 1. Chronic diastolic CHF: Echo in 77/82 with EF 60-65%, mildly decreased RV systolic function with moderate RV enlargement, severe biatrial enlargement, PASP 75 mmHg, dilated IVC. After increasing diuretics, RHC in 1/22 showed normal filling pressures with moderate pulmonary hypertension. Echo 4/23 with EF 60-65%, mildly enlarged RV with mildly decreased RV function. NYHA class II. He is not volume overloaded on exam, mild LE edema.  - Increase spironolactone to 25 mg daily. BMET today, repeat in 1 week. - Continue torsemide 10 mg daily.  - Continue losartan 25 mg daily. - No Jardiance due to yeast infection.  - Update echo next visit to follow RV. 2. 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  Moderate PH on RHC in 1/22.  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  Less likely group 1 PH.  No chronic PEs on 7/22 V/Q scan. 3. Chronic hypoxemic respiratory failure: Remote smoker.  Hilar and mediastinal lymphadenopathy on recent CT, no reported ILD.  Recurrent aspiration PNA in setting of chronic right vocal cord paralysis.  Was on home oxygen but able to stop after more effective diuresis, now just uses oxygen at night.  - Should follow with pulmonary.  4. CAD: s/p CABG in 2000.  Cath in 1/22 showed patent LIMA-LAD and SVG-D but occluded SVG-OM and SVG-RCA.  There was 60-70%  ostial RCA stenosis and 95% proximal RCA stenosis with heavy calcification.  Discussed with interventional, with normal EF plan was to proceed with initial medical management and proceed to PCI if chest pain worsened (PCI would require atherectomy).  No chest pain recently.     - Continue ASA 81 and atorvastatin, good lipids 1/23. - Continue Imdur 120 mg daily and Toprol XL 25 mg daily for angina control.  5. Atrial fibrillation: Permanent.  - On warfarin.  - Continue Toprol XL 25 mg daily.  6. PAD: Occluded right fem-pop bypass.  No claudication.  - Follows with [facility]. 7. Carotid stenosis: 85-90% LICA stenosis on 9/31 dopplers, followed at [facility].  8. AAA: 4.8 cm AAA on 5/23 abdominal CT.  - Followed by [facility].   9. Hematuria: None recently. On Coumadin. Planning for repeat cystoscopy next week.  Follow up in 3 months with Dr. Aundra Dubin + echo.  Maricela Bo [facility] FNP-BC 03/16/2022

## 2022-03-13 ENCOUNTER — Ambulatory Visit: Payer: Medicare HMO

## 2022-03-15 NOTE — Telephone Encounter (Signed)
Opened in error

## 2022-03-16 ENCOUNTER — Encounter (HOSPITAL_COMMUNITY): Payer: Self-pay

## 2022-03-16 ENCOUNTER — Ambulatory Visit (HOSPITAL_COMMUNITY)
Admission: RE | Admit: 2022-03-16 | Discharge: 2022-03-16 | Disposition: A | Discharge status: Home / Self Care | Payer: Medicare HMO | Source: Ambulatory Visit | Attending: Family Medicine | Admitting: Family Medicine

## 2022-03-16 ENCOUNTER — Encounter (HOSPITAL_COMMUNITY): Payer: Medicare HMO

## 2022-03-16 VITALS — BP 148/62 | HR 52 | Wt 207.0 lb

## 2022-03-16 DIAGNOSIS — I714 Abdominal aortic aneurysm, without rupture, unspecified: Secondary | ICD-10-CM | POA: Insufficient documentation

## 2022-03-16 DIAGNOSIS — I6529 Occlusion and stenosis of unspecified carotid artery: Secondary | ICD-10-CM

## 2022-03-16 DIAGNOSIS — I739 Peripheral vascular disease, unspecified: Secondary | ICD-10-CM | POA: Diagnosis not present

## 2022-03-16 DIAGNOSIS — J9611 Chronic respiratory failure with hypoxia: Secondary | ICD-10-CM | POA: Insufficient documentation

## 2022-03-16 DIAGNOSIS — Z7984 Long term (current) use of oral hypoglycemic drugs: Secondary | ICD-10-CM | POA: Diagnosis not present

## 2022-03-16 DIAGNOSIS — I2510 Atherosclerotic heart disease of native coronary artery without angina pectoris: Secondary | ICD-10-CM | POA: Diagnosis not present

## 2022-03-16 DIAGNOSIS — Z833 Family history of diabetes mellitus: Secondary | ICD-10-CM | POA: Diagnosis not present

## 2022-03-16 DIAGNOSIS — Z87891 Personal history of nicotine dependence: Secondary | ICD-10-CM | POA: Diagnosis not present

## 2022-03-16 DIAGNOSIS — I110 Hypertensive heart disease with heart failure: Secondary | ICD-10-CM | POA: Diagnosis not present

## 2022-03-16 DIAGNOSIS — E1151 Type 2 diabetes mellitus with diabetic peripheral angiopathy without gangrene: Secondary | ICD-10-CM | POA: Insufficient documentation

## 2022-03-16 DIAGNOSIS — Z8249 Family history of ischemic heart disease and other diseases of the circulatory system: Secondary | ICD-10-CM | POA: Diagnosis not present

## 2022-03-16 DIAGNOSIS — I5032 Chronic diastolic (congestive) heart failure: Secondary | ICD-10-CM | POA: Diagnosis not present

## 2022-03-16 DIAGNOSIS — Z79899 Other long term (current) drug therapy: Secondary | ICD-10-CM | POA: Insufficient documentation

## 2022-03-16 DIAGNOSIS — J3801 Paralysis of vocal cords and larynx, unilateral: Secondary | ICD-10-CM | POA: Diagnosis not present

## 2022-03-16 DIAGNOSIS — I4821 Permanent atrial fibrillation: Secondary | ICD-10-CM | POA: Insufficient documentation

## 2022-03-16 DIAGNOSIS — I25119 Atherosclerotic heart disease of native coronary artery with unspecified angina pectoris: Secondary | ICD-10-CM | POA: Insufficient documentation

## 2022-03-16 DIAGNOSIS — Z7901 Long term (current) use of anticoagulants: Secondary | ICD-10-CM | POA: Diagnosis not present

## 2022-03-16 DIAGNOSIS — I272 Pulmonary hypertension, unspecified: Secondary | ICD-10-CM | POA: Insufficient documentation

## 2022-03-16 DIAGNOSIS — I25810 Atherosclerosis of coronary artery bypass graft(s) without angina pectoris: Secondary | ICD-10-CM | POA: Diagnosis not present

## 2022-03-16 DIAGNOSIS — R319 Hematuria, unspecified: Secondary | ICD-10-CM | POA: Diagnosis not present

## 2022-03-16 LAB — BASIC METABOLIC PANEL
Anion gap: 10 (ref 5–15)
BUN: 20 mg/dL (ref 8–23)
CO2: 27 mmol/L (ref 22–32)
Calcium: 8.8 mg/dL — ABNORMAL LOW (ref 8.9–10.3)
Chloride: 99 mmol/L (ref 98–111)
Creatinine, Ser: 1 mg/dL (ref 0.61–1.24)
GFR, Estimated: >60 mL/min (ref >60)
Glucose, Bld: 95 mg/dL (ref 70–99)
Potassium: 3.8 mmol/L (ref 3.5–5.1)
Sodium: 136 mmol/L (ref 135–145)

## 2022-03-16 MED ORDER — SPIRONOLACTONE 25 MG PO TABS: 25.0000 mg | ORAL_TABLET | Freq: Every day | ORAL | 2 refills | Status: DC

## 2022-03-16 NOTE — Patient Instructions (Addendum)
Thank you for coming in today  Labs were done today, if any labs are abnormal the clinic will call you No news is good news  INCREASE Spironolactone 25 mg 1 tablet daily   Your physician recommends that you return for lab work in:  BMET in 1 week  Your physician recommends that you schedule a follow-up appointment in:  3 months with Dr. Aundra Dubin with echocardiogram      Do the following things EVERYDAY: Weigh yourself in the morning before breakfast. Write it down and keep it in a log. Take your medicines as prescribed Eat low salt foods--Limit salt (sodium) to 2000 mg per day.  Stay as active as you can everyday Limit all fluids for the day to less than 2 liters  At the [facility], you and your health needs are our priority. As part of our continuing mission to provide you with exceptional heart care, we have created designated Provider Care Teams. These Care Teams include your primary Cardiologist (physician) and Advanced Practice Providers (APPs- Physician Assistants and Nurse Practitioners) who all work together to provide you with the care you need, when you need it.   You may see any of the following providers on your designated Care Team at your next follow up: Dr Glori Bickers Dr Loralie Champagne Dr. Roxana Hires, NP Lyda Jester, [place] [facility] [place], [place] Forestine Na, NP Audry Riles, PharmD   Please be sure to bring in all your medications bottles to every appointment.   If you have any questions or concerns before your next appointment please send [place] a message through [place] or call our office at [phone].    TO LEAVE A MESSAGE FOR THE NURSE SELECT OPTION 2, PLEASE LEAVE A MESSAGE INCLUDING: YOUR NAME DATE OF BIRTH CALL BACK NUMBER REASON FOR CALL**this is important as we prioritize the call backs  YOU WILL RECEIVE A CALL BACK THE SAME DAY AS LONG AS YOU CALL BEFORE 4:00 PM

## 2022-03-16 NOTE — Pre-Procedure Instructions (Signed)
Messaged Gibson Ramp, RN about stopping coumadin prior to surgery.

## 2022-03-19 ENCOUNTER — Encounter (HOSPITAL_COMMUNITY): Payer: Self-pay

## 2022-03-19 ENCOUNTER — Encounter (HOSPITAL_COMMUNITY)
Admission: RE | Admit: 2022-03-19 | Discharge: 2022-03-19 | Disposition: A | Discharge status: Home / Self Care | Payer: Medicare HMO | Source: Ambulatory Visit | Attending: Urology | Admitting: Urology

## 2022-03-19 ENCOUNTER — Ambulatory Visit: Payer: Medicare HMO

## 2022-03-19 HISTORY — DX: Cardiac murmur, unspecified: R01.1

## 2022-03-22 ENCOUNTER — Ambulatory Visit (HOSPITAL_COMMUNITY): Payer: No Typology Code available for payment source

## 2022-03-22 ENCOUNTER — Ambulatory Visit (HOSPITAL_COMMUNITY): Payer: No Typology Code available for payment source | Admitting: Anesthesiology

## 2022-03-22 ENCOUNTER — Encounter (HOSPITAL_COMMUNITY): Payer: Self-pay | Admitting: Urology

## 2022-03-22 ENCOUNTER — Ambulatory Visit (HOSPITAL_BASED_OUTPATIENT_CLINIC_OR_DEPARTMENT_OTHER): Payer: No Typology Code available for payment source | Admitting: Anesthesiology

## 2022-03-22 ENCOUNTER — Encounter (HOSPITAL_COMMUNITY)
Admission: RE | Disposition: A | Discharge status: Home / Self Care | Payer: Self-pay | Source: Ambulatory Visit | Attending: Urology

## 2022-03-22 ENCOUNTER — Ambulatory Visit (HOSPITAL_COMMUNITY)
Admission: RE | Admit: 2022-03-22 | Discharge: 2022-03-22 | Disposition: A | Discharge status: Home / Self Care | Payer: No Typology Code available for payment source | Source: Ambulatory Visit | Attending: Urology | Admitting: Urology

## 2022-03-22 DIAGNOSIS — E119 Type 2 diabetes mellitus without complications: Secondary | ICD-10-CM | POA: Diagnosis not present

## 2022-03-22 DIAGNOSIS — I509 Heart failure, unspecified: Secondary | ICD-10-CM | POA: Diagnosis not present

## 2022-03-22 DIAGNOSIS — D630 Anemia in neoplastic disease: Secondary | ICD-10-CM

## 2022-03-22 DIAGNOSIS — I2510 Atherosclerotic heart disease of native coronary artery without angina pectoris: Secondary | ICD-10-CM | POA: Diagnosis not present

## 2022-03-22 DIAGNOSIS — N3041 Irradiation cystitis with hematuria: Secondary | ICD-10-CM | POA: Insufficient documentation

## 2022-03-22 DIAGNOSIS — I252 Old myocardial infarction: Secondary | ICD-10-CM | POA: Diagnosis not present

## 2022-03-22 DIAGNOSIS — R310 Gross hematuria: Secondary | ICD-10-CM

## 2022-03-22 DIAGNOSIS — Z87891 Personal history of nicotine dependence: Secondary | ICD-10-CM | POA: Diagnosis not present

## 2022-03-22 DIAGNOSIS — N329 Bladder disorder, unspecified: Secondary | ICD-10-CM

## 2022-03-22 DIAGNOSIS — I110 Hypertensive heart disease with heart failure: Secondary | ICD-10-CM | POA: Insufficient documentation

## 2022-03-22 DIAGNOSIS — D494 Neoplasm of unspecified behavior of bladder: Secondary | ICD-10-CM | POA: Insufficient documentation

## 2022-03-22 DIAGNOSIS — Z951 Presence of aortocoronary bypass graft: Secondary | ICD-10-CM | POA: Diagnosis not present

## 2022-03-22 HISTORY — PX: CYSTOSCOPY WITH FULGERATION: SHX6638

## 2022-03-22 LAB — GLUCOSE, CAPILLARY: Glucose-Capillary: 123 mg/dL — ABNORMAL HIGH (ref 70–99)

## 2022-03-22 SURGERY — CYSTOSCOPY, WITH BLADDER FULGURATION
Anesthesia: General | Site: Bladder

## 2022-03-22 MED ORDER — EPHEDRINE 5 MG/ML INJ
INTRAVENOUS | Status: AC
  Filled 2022-03-22: qty 5

## 2022-03-22 MED ORDER — ONDANSETRON HCL 4 MG/2ML IJ SOLN
INTRAMUSCULAR | Status: DC | PRN
  Administered 2022-03-22: 4 mg via INTRAVENOUS

## 2022-03-22 MED ORDER — LACTATED RINGERS IV SOLN: INTRAVENOUS | Status: DC | PRN

## 2022-03-22 MED ORDER — LIDOCAINE HCL (CARDIAC) PF 100 MG/5ML IV SOSY
PREFILLED_SYRINGE | INTRAVENOUS | Status: DC | PRN
  Administered 2022-03-22: 60 mg via INTRAVENOUS

## 2022-03-22 MED ORDER — HYDROCODONE-ACETAMINOPHEN 10-325 MG PO TABS: 1.0000 | ORAL_TABLET | Freq: Two times a day (BID) | ORAL | 0 refills | Status: DC | PRN

## 2022-03-22 MED ORDER — EPHEDRINE SULFATE-NACL 50-0.9 MG/10ML-% IV SOSY
PREFILLED_SYRINGE | INTRAVENOUS | Status: DC | PRN
  Administered 2022-03-22: 10 mg via INTRAVENOUS
  Administered 2022-03-22: 5 mg via INTRAVENOUS

## 2022-03-22 MED ORDER — ROCURONIUM BROMIDE 10 MG/ML (PF) SYRINGE
PREFILLED_SYRINGE | INTRAVENOUS | Status: DC | PRN
  Administered 2022-03-22: 50 mg via INTRAVENOUS

## 2022-03-22 MED ORDER — SODIUM CHLORIDE 0.9 % IR SOLN
Status: DC | PRN
  Administered 2022-03-22: 3000 mL

## 2022-03-22 MED ORDER — STERILE WATER FOR IRRIGATION IR SOLN
Status: DC | PRN
  Administered 2022-03-22: 500 mL

## 2022-03-22 MED ORDER — ROCURONIUM BROMIDE 10 MG/ML (PF) SYRINGE
PREFILLED_SYRINGE | INTRAVENOUS | Status: AC
  Filled 2022-03-22: qty 10

## 2022-03-22 MED ORDER — DIATRIZOATE MEGLUMINE 30 % UR SOLN
URETHRAL | Status: DC | PRN
  Administered 2022-03-22: 100 mL via URETHRAL

## 2022-03-22 MED ORDER — FENTANYL CITRATE (PF) 100 MCG/2ML IJ SOLN
INTRAMUSCULAR | Status: DC | PRN
  Administered 2022-03-22: 50 ug via INTRAVENOUS

## 2022-03-22 MED ORDER — DEXAMETHASONE SODIUM PHOSPHATE 4 MG/ML IJ SOLN
INTRAMUSCULAR | Status: DC | PRN
  Administered 2022-03-22: 5 mg via INTRAVENOUS

## 2022-03-22 MED ORDER — PROPOFOL 10 MG/ML IV BOLUS
INTRAVENOUS | Status: DC | PRN
  Administered 2022-03-22: 130 mg via INTRAVENOUS

## 2022-03-22 MED ORDER — LIDOCAINE HCL (PF) 2 % IJ SOLN
INTRAMUSCULAR | Status: AC
  Filled 2022-03-22: qty 5

## 2022-03-22 MED ORDER — FENTANYL CITRATE (PF) 100 MCG/2ML IJ SOLN
INTRAMUSCULAR | Status: AC
  Filled 2022-03-22: qty 2

## 2022-03-22 MED ORDER — DIATRIZOATE MEGLUMINE 30 % UR SOLN
URETHRAL | Status: AC
  Filled 2022-03-22: qty 100

## 2022-03-22 MED ORDER — CEFAZOLIN SODIUM-DEXTROSE 2-4 GM/100ML-% IV SOLN
2.0000 g | INTRAVENOUS | Status: AC
  Administered 2022-03-22: 2 g via INTRAVENOUS
  Filled 2022-03-22: qty 100

## 2022-03-22 MED ORDER — SUGAMMADEX SODIUM 200 MG/2ML IV SOLN
INTRAVENOUS | Status: DC | PRN
  Administered 2022-03-22: 200 mg via INTRAVENOUS

## 2022-03-22 SURGICAL SUPPLY — 30 items
BAG DRAIN URO TABLE W/ADPT NS (BAG) ×3 IMPLANT
BAG DRN 8 ADPR NS SKTRN CSTL (BAG) ×3
BAG DRN RND TRDRP ANRFLXCHMBR (UROLOGICAL SUPPLIES) ×3
BAG HAMPER (MISCELLANEOUS) ×3 IMPLANT
BAG URINE DRAIN 2000ML AR STRL (UROLOGICAL SUPPLIES) ×2 IMPLANT
CATH COUDE FOLEY 2W 5CC 18FR (CATHETERS) ×2 IMPLANT
CATH FOLEY 2WAY SLVR  5CC 18FR (CATHETERS)
CATH FOLEY 2WAY SLVR 5CC 18FR (CATHETERS) IMPLANT
CATH INTERMIT  6FR 70CM (CATHETERS) ×3 IMPLANT
CLOTH BEACON ORANGE TIMEOUT ST (SAFETY) ×3 IMPLANT
DECANTER SPIKE VIAL GLASS SM (MISCELLANEOUS) ×3 IMPLANT
ELECT REM PT RETURN 9FT ADLT (ELECTROSURGICAL) ×3
ELECTRODE REM PT RTRN 9FT ADLT (ELECTROSURGICAL) ×3 IMPLANT
GLOVE BIO SURGEON STRL SZ8 (GLOVE) ×3 IMPLANT
GLOVE BIOGEL PI IND STRL 7.0 (GLOVE) ×6 IMPLANT
GOWN STRL REUS W/TWL LRG LVL3 (GOWN DISPOSABLE) ×3 IMPLANT
GOWN STRL REUS W/TWL XL LVL3 (GOWN DISPOSABLE) ×3 IMPLANT
GUIDEWIRE STR DUAL SENSOR (WIRE) ×2 IMPLANT
IV NS IRRIG 3000ML ARTHROMATIC (IV SOLUTION) ×3 IMPLANT
KIT TURNOVER CYSTO (KITS) ×3 IMPLANT
LOOP CUT BIPOLAR 24F LRG (ELECTROSURGICAL) ×2 IMPLANT
MANIFOLD NEPTUNE II (INSTRUMENTS) ×3 IMPLANT
NDL HYPO 18GX1.5 BLUNT FILL (NEEDLE) ×1 IMPLANT
NEEDLE HYPO 18GX1.5 BLUNT FILL (NEEDLE) ×3 IMPLANT
PACK CYSTO (CUSTOM PROCEDURE TRAY) ×3 IMPLANT
PAD ARMBOARD 7.5X6 YLW CONV (MISCELLANEOUS) ×3 IMPLANT
PAD TELFA 3X4 1S STER (GAUZE/BANDAGES/DRESSINGS) ×3 IMPLANT
TOWEL OR 17X26 4PK STRL BLUE (TOWEL DISPOSABLE) ×3 IMPLANT
WATER STERILE IRR 3000ML UROMA (IV SOLUTION) ×3 IMPLANT
WATER STERILE IRR 500ML POUR (IV SOLUTION) ×3 IMPLANT

## 2022-03-22 NOTE — Anesthesia Preprocedure Evaluation (Addendum)
Anesthesia Evaluation  Patient identified by MRN, date of birth, ID band Patient awake    Reviewed: Allergy & Precautions, H&P , NPO status , Patient's Chart, lab work & pertinent test results, reviewed documented beta blocker date and time   Airway Mallampati: II  TM Distance: >3 FB Neck ROM: full    Dental no notable dental hx.    Pulmonary former smoker   Pulmonary exam normal breath sounds clear to auscultation       Cardiovascular Exercise Tolerance: Good hypertension, + CAD, + Past MI, + CABG, + Peripheral Vascular Disease, +CHF and + DOE   Rhythm:regular Rate:Normal     Neuro/Psych negative neurological ROS  negative psych ROS   GI/Hepatic Neg liver ROS, PUD,GERD  Medicated,,  Endo/Other  diabetes, Type 2    Renal/GU negative Renal ROS  negative genitourinary   Musculoskeletal   Abdominal   Peds  Hematology  (+) Blood dyscrasia, anemia   Anesthesia Other Findings 1. Left ventricular ejection fraction, by estimation, is 55 to 60%. The  left ventricle has normal function. The left ventricle has no regional  wall motion abnormalities. There is mild left ventricular hypertrophy.  Left ventricular diastolic parameters  are indeterminate.  2. Ventricular septum is flattened in systole suggesting RV pressure  overload. RV not well visualized. Grossly appears mildly enlarged with  mildly reduced function. . Right ventricular systolic function is mildly  reduced. The right ventricular size is  mildly enlarged. 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  3. Left atrial size was severely dilated.  4. Right atrial size was severely dilated.  5. The mitral valve is abnormal. Mild mitral valve regurgitation. No  evidence of mitral stenosis.  6. The tricuspid valve is abnormal.  7. The aortic valve is tricuspid. There is moderate calcification of the  aortic valve. There is moderate  thickening of the aortic valve. Aortic  valve regurgitation is mild. No aortic stenosis is present.  8. The inferior vena cava is dilated in size with >50% respiratory  variability, suggesting right atrial pressure of 8 mmHg.   Reproductive/Obstetrics negative OB ROS                             Anesthesia Physical Anesthesia Plan  ASA: 3  Anesthesia Plan: General   Post-op Pain Management:    Induction:   PONV Risk Score and Plan: Ondansetron  Airway Management Planned: Oral ETT  Additional Equipment:   Intra-op Plan:   Post-operative Plan:   Informed Consent: I have reviewed the patients History and Physical, chart, labs and discussed the procedure including the risks, benefits and alternatives for the proposed anesthesia with the patient or authorized representative who has indicated his/her understanding and acceptance.     Dental Advisory Given  Plan Discussed with: CRNA and Anesthesiologist  Anesthesia Plan Comments:         Anesthesia Quick Evaluation

## 2022-03-22 NOTE — Anesthesia Procedure Notes (Addendum)
Procedure Name: Intubation Date/Time: 03/22/2022 12:28 PM  Performed by: Karna Dupes, CRNAPre-anesthesia Checklist: Patient identified, Emergency Drugs available, Suction available and Patient being monitored Patient Re-evaluated:Patient Re-evaluated prior to induction Oxygen Delivery Method: Circle system utilized Preoxygenation: Pre-oxygenation with 100% oxygen Induction Type: IV induction and Cricoid Pressure applied Ventilation: Mask ventilation without difficulty Laryngoscope Size: Mac and 4 Grade View: Grade I Tube type: Oral Tube size: 7.5 mm Number of attempts: 1 Airway Equipment and Method: Stylet Placement Confirmation: ETT inserted through vocal cords under direct vision, positive ETCO2 and breath sounds checked- equal and bilateral Secured at: 21 cm Tube secured with: Tape Dental Injury: Teeth and Oropharynx as per pre-operative assessment

## 2022-03-22 NOTE — Transfer of Care (Signed)
Immediate Anesthesia Transfer of Care Note  Patient: Troy [name]  Procedure(s) Performed: CYSTOSCOPY WITH FULGERATION (Bladder)  Patient Location: PACU  Anesthesia Type:General  Level of Consciousness: awake and alert   Airway & Oxygen Therapy: Patient Spontanous Breathing and Patient connected to nasal cannula oxygen  Post-op Assessment: Report given to RN and Post -op Vital signs reviewed and stable  Post vital signs: Reviewed and stable  Last Vitals:  Vitals Value Taken Time  BP 147/65 03/22/22 1312  Temp    Pulse 64 03/22/22 1316  Resp 15 03/22/22 1316  SpO2 100 % 03/22/22 1316  Vitals shown include unvalidated device data.  Last Pain:  Vitals:   03/22/22 1126  TempSrc: Oral  PainSc: 0-No pain         Complications: No notable events documented.

## 2022-03-22 NOTE — H&P (Signed)
Urology Admission H&P  Chief Complaint: Gross hematuria  History of Present Illness: [name] is a 81yo here for cystoscopy and fulgeration for recurrent gross hematuria. He denies any pelvic pain currently. No worsening LUTS. No other complaitns today  Past Medical History:  Diagnosis Date   AAA (abdominal aortic aneurysm) ([place])    Followed by Dr. Sherren Mocha Early   Arthritis    CAD (coronary artery disease)    a. CABG 2000.   Cancer [facility])    Prostate:  Radiation Tx   Carotid artery disease ([place])    Chest pain    precordial. mild chronic .[name]... nonischemic   CHF (congestive heart failure) ([facility])    Chronic edema    Coronary artery disease    a. Nuclear, January, 2008, no ischemia b. Cath 08/2012- 1/4 patent grafts, RCA CTO, no flow-limiting disease, medically managed   Diabetes mellitus without complication ([place])    Dizziness 02/2011   Dyslipidemia    Fall    GERD (gastroesophageal reflux disease)    TAKES TUMS & ROLAIDS AS NEEDED   Heart murmur    Hx of CABG 2000   Hypertension    Myocardial infarction [facility]) 1999   Neck pain 02/2011   Pneumonia    Pulmonary hypertension ([place])    Renal artery stenosis (HCC)    50-70%   S/P femoropopliteal bypass surgery    Dr. Donnetta Hutching   Sinus bradycardia    Asymptomatic   Past Surgical History:  Procedure Laterality Date   CARDIAC CATHETERIZATION  09/26/2012   1/4 patent bypass (occluded SVG-PDA, SVG-OM, LIMA-LAD), SVG-diagonal patent and fills the diagonal and LAD, distal RCA occlusion with left to right collateralization, patent circumflex, LAD with no flow-limiting disease and antegrade flow competitively from SVG-diagonal; EF 60-65%   COLONOSCOPY  11/30/2009   UTM:[id number]. next TCS 11/2019   COLONOSCOPY WITH PROPOFOL N/A 12/18/2019   Procedure: COLONOSCOPY WITH PROPOFOL;  Surgeon: Harvel Quale, MD;  Location[facility];  Service: Gastroenterology;  Laterality: N/A;  1030   CORONARY ARTERY BYPASS GRAFT  2000    CYSTOSCOPY N/A 08/31/2021   Procedure: CYSTOSCOPY;  Surgeon: Cleon Gustin, MD;  Location: [facility] ORS;  Service: Urology;  Laterality: N/A;  pt knows to arrive at 7:00   ESOPHAGOGASTRODUODENOSCOPY N/A 08/12/2013   XNT:[id number] peptic stricture with erosive refluxesophagitis - status post Maloney dilation. Hiatal hernia. Abnormalgastric mucosa. Deformity of the pyloric channel suggestive ofprior peptic ulcer disease. Duodenal bulbar diverticulum Statuspost gastric biopsy. h.pylori   LEFT HEART CATHETERIZATION WITH CORONARY/GRAFT ANGIOGRAM N/A 09/26/2012   Procedure: LEFT HEART CATHETERIZATION WITH Beatrix Fetters;  Surgeon: Burnell Blanks, MD;  Location: [facility] CATH LAB;  Service: Cardiovascular;  Laterality: N/A;   MALONEY DILATION N/A 08/12/2013   Procedure: Venia Minks DILATION;  Surgeon: Daneil Dolin, MD;  Location: [facility];  Service: Endoscopy;  Laterality: N/A;   POLYPECTOMY  12/18/2019   Procedure: POLYPECTOMY;  Surgeon: Harvel Quale, MD;  Location: [facility];  Service: Gastroenterology;;  ascending colon polyp    PR VEIN BYPASS GRAFT,AORTO-FEM-POP Right 07/19/1999   PR VEIN BYPASS GRAFT,AORTO-FEM-POP Left 05/02/2006   RIGHT HEART CATH AND CORONARY/GRAFT ANGIOGRAPHY N/A 04/11/2020   Procedure: RIGHT HEART CATH AND CORONARY/GRAFT ANGIOGRAPHY;  Surgeon: Larey Dresser, MD;  Location: [place] CV LAB;  Service: Cardiovascular;  Laterality: N/A;   SAVORY DILATION N/A 08/12/2013   Procedure: SAVORY DILATION;  Surgeon: Daneil Dolin, MD;  Location[facility];  Service: Endoscopy;  Laterality: N/A;  TRANSURETHRAL RESECTION OF BLADDER TUMOR N/A 08/31/2021   Procedure: TRANSURETHRAL RESECTION OF BLADDER TUMOR (TURBT);  Surgeon: Cleon Gustin, MD;  Location: [facility];  Service: Urology;  Laterality: N/A;   WRIST SURGERY     cyst removal    Home Medications:  Current Facility-Administered Medications  Medication Dose Route Frequency Provider Last  Rate Last Admin   ceFAZolin (ANCEF) IVPB 2g/100 mL premix  2 g Intravenous 30 min Pre-Op Rubert Frediani, Candee Furbish, MD       Allergies:  Allergies  Allergen Reactions   Niacin Itching and Rash    Burning sensation   Contrast Media [Iodinated Contrast Media] Nausea And Vomiting   Iodine-131 Nausea And Vomiting   Nsaids Other (See Comments)    Taking Coumadin    Family History  Problem Relation Age of Onset   Deep vein thrombosis Father    Lung cancer Sister    Diabetes Sister    Heart disease Sister        After age 40   Hyperlipidemia Sister    Hypertension Sister    Lung cancer Sister    Breast cancer Sister    Hypertension Mother    Diabetes Sister    Coronary artery disease Other        family hx of   Cancer Brother        "crab cancer"   Heart disease Brother    Heart attack Brother    Heart attack Daughter    Colon cancer Neg Hx    Stroke Neg Hx    Social History:  reports that he quit smoking about 3 years ago. His smoking use included cigarettes. He has a 140.00 pack-year smoking history. He has never used smokeless tobacco. He reports current alcohol use. He reports that he does not use drugs.  Review of Systems  Genitourinary:  Positive for hematuria.  All other systems reviewed and are negative.   Physical Exam:  Vital signs in last 24 hours: Temp:  [98.2 F (36.8 C)] 98.2 F (36.8 C) (12/21 1126) Pulse Rate:  [55] 55 (12/21 1126) Resp:  [16] 16 (12/21 1126) BP: (159)/(62) 159/62 (12/21 1126) SpO2:  [98 %] 98 % (12/21 1126) Weight:  [93.9 kg] 93.9 kg (12/21 1126) Physical Exam Vitals reviewed.  Constitutional:      Appearance: Normal appearance.  HENT:     Head: Normocephalic and atraumatic.     Mouth/Throat:     Mouth: Mucous membranes are dry.  Eyes:     Extraocular Movements: Extraocular movements intact.     Pupils: Pupils are equal, round, and reactive to light.  Cardiovascular:     Rate and Rhythm: Normal rate and regular rhythm.   Pulmonary:     Effort: Pulmonary effort is normal. No respiratory distress.  Abdominal:     General: Abdomen is flat. There is no distension.  Musculoskeletal:        General: No swelling. Normal range of motion.     Cervical back: Normal range of motion and neck supple.  Skin:    General: Skin is warm and dry.  Neurological:     General: No focal deficit present.     Mental Status: He is alert and oriented to person, place, and time.  Psychiatric:        Mood and Affect: Mood normal.        Behavior: Behavior normal.        Thought Content: Thought content normal.  Judgment: Judgment normal.     Laboratory Data:  No results found for this or any previous visit (from the past 24 hour(s)). No results found for this or any previous visit (from the past 240 hour(s)). 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

## 2022-03-22 NOTE — Op Note (Signed)
.  Preoperative diagnosis: gross hematuria  Postoperative diagnosis: Same  Procedure: 1 cystoscopy 2.  Clot evacuation 3. Fulgeration of multiple bladder and prostatic lesions  Attending: Rosie Fate  Anesthesia: General  Estimated blood loss: Minimal  Drains: 22 French foley  Specimens: none  Antibiotics: ancef  Findings: multiple areas of irritation from foley catheter. 40cc clot in the bladder. 1cm papillary dome tumor.  Numerous posterior wall and prostatic urethra erythematous lesions consistent with radiation changes. Ureteral orifices in normal anatomic location.   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  Procedure in detail: The patient was brought to the operating room and a brief timeout was done to ensure correct patient, correct procedure, correct site.  General anesthesia was administered patient was placed in dorsal lithotomy position.  Their genitalia was then prepped and draped in usual sterile fashion.  A rigid 99 French cystoscope was passed in the urethra and the bladder.  Bladder was inspected and we noted a large amount of clot and a 1cm bladder tumor.  the ureteral orifices were in the normal orthotopic locations. We then removed the cystoscope and placed a resectoscope into the bladder. We proceeded to remove the large clot burden from the bladder. Once this was complete we turned our attention to the bladder tumor. Using the bipolar resectoscope we removed the bladder tumor down to the base. We then cauterized mutiple 0.5-1.0cm bladder lesions and 2 1cm prostatic urethra lesions. Hemostasis was then obtained with electrocautery.  We then re-inspected the bladder and found no residula bleeding.  the bladder was then drained, a 22 French foley was placed and this concluded the  procedure which was well tolerated by patient.  Complications: None  Condition: Stable, extubated, transferred to PACU  Plan: Patient is to be discharged home and followup in 7 days for foley catheter removal and pathology discussion.

## 2022-03-23 ENCOUNTER — Other Ambulatory Visit (HOSPITAL_COMMUNITY): Payer: Self-pay

## 2022-03-23 ENCOUNTER — Telehealth (HOSPITAL_COMMUNITY): Payer: Self-pay

## 2022-03-23 DIAGNOSIS — I5032 Chronic diastolic (congestive) heart failure: Secondary | ICD-10-CM

## 2022-03-23 NOTE — Telephone Encounter (Signed)
-----   Message from Rafael Bihari, [place] sent at 03/23/2022  4:24 PM EST ----- Please arrange BMET next week.

## 2022-03-23 NOTE — Anesthesia Postprocedure Evaluation (Signed)
Anesthesia Post Note  Patient: Troy [name]  Procedure(s) Performed: CYSTOSCOPY WITH FULGERATION (Bladder)  Patient location during evaluation: Phase II Anesthesia Type: General Level of consciousness: awake Pain management: pain level controlled Vital Signs Assessment: post-procedure vital signs reviewed and stable Respiratory status: spontaneous breathing and respiratory function stable Cardiovascular status: blood pressure returned to baseline and stable Postop Assessment: no headache and no apparent nausea or vomiting Anesthetic complications: no Comments: Late entry   No notable events documented.   Last Vitals:  Vitals:   03/22/22 1315 03/22/22 1330  BP: (!) 146/87 (!) 163/73  Pulse: (!) 58 (!) 56  Resp: 19 16  Temp:    SpO2: 100% 100%    Last Pain:  Vitals:   03/22/22 1330  TempSrc:   PainSc: 0-No pain                 Louann Sjogren

## 2022-03-23 NOTE — Telephone Encounter (Signed)
Patient will have repeat blood work done at [facility]. Order placed and released   Orders Placed This Encounter  Procedures   Basic metabolic panel    Standing Status:   Future    Standing Expiration Date:   03/24/2023    Order Specific Question:   Release to patient    Answer:   Immediate

## 2022-03-29 DIAGNOSIS — I5032 Chronic diastolic (congestive) heart failure: Secondary | ICD-10-CM | POA: Diagnosis not present

## 2022-03-29 DIAGNOSIS — Z79899 Other long term (current) drug therapy: Secondary | ICD-10-CM | POA: Diagnosis not present

## 2022-03-30 LAB — BASIC METABOLIC PANEL
BUN/Creatinine Ratio: 14 (ref 10–24)
BUN: 15 mg/dL (ref 8–27)
CO2: 25 mmol/L (ref 20–29)
Calcium: 9.4 mg/dL (ref 8.6–10.2)
Chloride: 100 mmol/L (ref 96–106)
Creatinine, Ser: 1.04 mg/dL (ref 0.76–1.27)
Glucose: 115 mg/dL — ABNORMAL HIGH (ref 70–99)
Potassium: 4.4 mmol/L (ref 3.5–5.2)
Sodium: 142 mmol/L (ref 134–144)
eGFR: 73 mL/min/{1.73_m2} (ref >59)

## 2022-04-03 DIAGNOSIS — I509 Heart failure, unspecified: Secondary | ICD-10-CM | POA: Diagnosis not present

## 2022-04-03 DIAGNOSIS — E782 Mixed hyperlipidemia: Secondary | ICD-10-CM | POA: Diagnosis not present

## 2022-04-03 DIAGNOSIS — I10 Essential (primary) hypertension: Secondary | ICD-10-CM | POA: Diagnosis not present

## 2022-04-03 DIAGNOSIS — Z6831 Body mass index (BMI) 31.0-31.9, adult: Secondary | ICD-10-CM | POA: Diagnosis not present

## 2022-04-03 DIAGNOSIS — Z1331 Encounter for screening for depression: Secondary | ICD-10-CM | POA: Diagnosis not present

## 2022-04-03 DIAGNOSIS — E1165 Type 2 diabetes mellitus with hyperglycemia: Secondary | ICD-10-CM | POA: Diagnosis not present

## 2022-04-03 DIAGNOSIS — R310 Gross hematuria: Secondary | ICD-10-CM | POA: Diagnosis not present

## 2022-04-03 DIAGNOSIS — Z0001 Encounter for general adult medical examination with abnormal findings: Secondary | ICD-10-CM | POA: Diagnosis not present

## 2022-04-03 DIAGNOSIS — E118 Type 2 diabetes mellitus with unspecified complications: Secondary | ICD-10-CM | POA: Diagnosis not present

## 2022-04-03 DIAGNOSIS — I2510 Atherosclerotic heart disease of native coronary artery without angina pectoris: Secondary | ICD-10-CM | POA: Diagnosis not present

## 2022-04-04 ENCOUNTER — Encounter (HOSPITAL_COMMUNITY): Payer: Self-pay | Admitting: Urology

## 2022-04-04 ENCOUNTER — Ambulatory Visit (INDEPENDENT_AMBULATORY_CARE_PROVIDER_SITE_OTHER): Payer: No Typology Code available for payment source | Admitting: Urology

## 2022-04-04 VITALS — BP 115/59 | HR 64

## 2022-04-04 DIAGNOSIS — R310 Gross hematuria: Secondary | ICD-10-CM

## 2022-04-04 DIAGNOSIS — R339 Retention of urine, unspecified: Secondary | ICD-10-CM | POA: Diagnosis not present

## 2022-04-04 NOTE — Progress Notes (Signed)
Fill and Pull Catheter Removal  Patient is present today for a catheter removal.  Patient was cleaned and prepped in a sterile fashion 155m of sterile water/ saline was instilled into the bladder when the patient felt the urge to urinate. 189mof water was then drained from the balloon.  A 18FR foley cath was removed from the bladder no complications were noted .  Patient as then given some time to void on their own.  Patient can void  10026mn their own after some time.  Patient tolerated well.  Performed by: KouLevi AlandMA  Follow up/ Additional notes: Follow up with MD, patient will return for PVR later today.

## 2022-04-04 NOTE — Progress Notes (Signed)
04/04/2022 9:33 AM   Troy [name] 12-12-1940 [id number]  Referring provider: Sharilyn Sites, [address],  [place] [address]  Followup gross hematuria   HPI: Troy [name] is a 82yo here for followup for gross hematuria and urinary retention. He has had 2 episodes of gross hematuria that occurred with activity. No pelvic pain. Voiding trial passed today   PMH: Past Medical History:  Diagnosis Date   AAA (abdominal aortic aneurysm) ([place])    Followed by Dr. Sherren Mocha Early   Arthritis    CAD (coronary artery disease)    a. CABG 2000.   Cancer [facility])    Prostate:  Radiation Tx   Carotid artery disease ([place])    Chest pain    precordial. mild chronic .[name]... nonischemic   CHF (congestive heart failure) (HCC)    Chronic edema    Coronary artery disease    a. Nuclear, January, 2008, no ischemia b. Cath 08/2012- 1/4 patent grafts, RCA CTO, no flow-limiting disease, medically managed   Diabetes mellitus without complication ([place])    Dizziness 02/2011   Dyslipidemia    Fall    GERD (gastroesophageal reflux disease)    TAKES TUMS & ROLAIDS AS NEEDED   Heart murmur    Hx of CABG 2000   Hypertension    Myocardial infarction [facility]) 1999   Neck pain 02/2011   Pneumonia    Pulmonary hypertension ([place])    Renal artery stenosis (HCC)    50-70%   S/P femoropopliteal bypass surgery    Dr. Donnetta Hutching   Sinus bradycardia    Asymptomatic    Surgical History: Past Surgical History:  Procedure Laterality Date   CARDIAC CATHETERIZATION  09/26/2012   1/4 patent bypass (occluded SVG-PDA, SVG-OM, LIMA-LAD), SVG-diagonal patent and fills the diagonal and LAD, distal RCA occlusion with left to right collateralization, patent circumflex, LAD with no flow-limiting disease and antegrade flow competitively from SVG-diagonal; EF 60-65%   COLONOSCOPY  11/30/2009   DZH:[id number]. next TCS 11/2019   COLONOSCOPY WITH PROPOFOL N/A 12/18/2019   Procedure: COLONOSCOPY WITH PROPOFOL;  Surgeon:  Harvel Quale, MD;  [facility];  Service: Gastroenterology;  Laterality: N/A;  1030   CORONARY ARTERY BYPASS GRAFT  2000   CYSTOSCOPY N/A 08/31/2021   Procedure: CYSTOSCOPY;  Surgeon: Cleon Gustin, MD;  Location: [facility] ORS;  Service: Urology;  Laterality: N/A;  pt knows to arrive at 7:00   [place] N/A 03/22/2022   Procedure: CYSTOSCOPY WITH FULGERATION;  Surgeon: Cleon Gustin, MD;  Location: [facility] ORS;  Service: Urology;  Laterality: N/A;   ESOPHAGOGASTRODUODENOSCOPY N/A 08/12/2013   IWL:[id number] peptic stricture with erosive refluxesophagitis - status post Maloney dilation. Hiatal hernia. Abnormalgastric mucosa. Deformity of the pyloric channel suggestive ofprior peptic ulcer disease. Duodenal bulbar diverticulum Statuspost gastric biopsy. h.pylori   LEFT HEART CATHETERIZATION WITH CORONARY/GRAFT ANGIOGRAM N/A 09/26/2012   Procedure: LEFT HEART CATHETERIZATION WITH Beatrix Fetters;  Surgeon: Burnell Blanks, MD;  Location: [facility] CATH LAB;  Service: Cardiovascular;  Laterality: N/A;   MALONEY DILATION N/A 08/12/2013   Procedure: Venia Minks DILATION;  Surgeon: Daneil Dolin, MD;  Location[facility];  Service: Endoscopy;  Laterality: N/A;   POLYPECTOMY  12/18/2019   Procedure: POLYPECTOMY;  Surgeon: Harvel Quale, MD;  Location: [facility];  Service: Gastroenterology;;  ascending colon polyp    PR VEIN BYPASS GRAFT,AORTO-FEM-POP Right 07/19/1999   PR VEIN BYPASS GRAFT,AORTO-FEM-POP Left 05/02/2006   RIGHT HEART CATH AND CORONARY/GRAFT ANGIOGRAPHY  N/A 04/11/2020   Procedure: RIGHT HEART CATH AND CORONARY/GRAFT ANGIOGRAPHY;  Surgeon: Larey Dresser, MD;  Location: [place] CV LAB;  Service: Cardiovascular;  Laterality: N/A;   SAVORY DILATION N/A 08/12/2013   Procedure: SAVORY DILATION;  Surgeon: Daneil Dolin, MD;  [facility];  Service: Endoscopy;  Laterality: N/A;   TRANSURETHRAL RESECTION OF  BLADDER TUMOR N/A 08/31/2021   Procedure: TRANSURETHRAL RESECTION OF BLADDER TUMOR (TURBT);  Surgeon: Cleon Gustin, MD;  Location: [facility];  Service: Urology;  Laterality: N/A;   WRIST SURGERY     cyst removal    Home Medications:  Allergies as of 04/04/2022       Reactions   Niacin Itching, Rash   Burning sensation   Contrast Media [iodinated Contrast Media] Nausea And Vomiting   Iodine-131 Nausea And Vomiting   Nsaids Other (See Comments)   Taking Coumadin        Medication List        Accurate as of April 04, 2022  9:33 AM. If you have any questions, ask your nurse or doctor.          acetaminophen 325 MG tablet Commonly known as: TYLENOL Take 2 tablets (650 mg total) by mouth every 6 (six) hours as needed for mild pain, fever or headache (or Fever >/= 101). What changed: how much to take   albuterol 108 (90 Base) MCG/ACT inhaler Commonly known as: VENTOLIN HFA Inhale 2 puffs into the lungs every 6 (six) hours as needed for wheezing or shortness of breath.   aspirin EC 81 MG tablet Take 1 tablet (81 mg total) by mouth daily with breakfast.   atorvastatin 40 MG tablet Commonly known as: LIPITOR Take 1 tablet (40 mg total) by mouth daily.   dutasteride 0.5 MG capsule Commonly known as: AVODART Take 1 capsule (0.5 mg total) by mouth daily.   famotidine 20 MG tablet Commonly known as: Pepcid One after supper What changed:  how much to take how to take this when to take this   HYDROcodone-acetaminophen 10-325 MG tablet Commonly known as: NORCO Take 1 tablet by mouth 2 (two) times daily as needed for moderate pain.   ipratropium 0.03 % nasal spray Commonly known as: ATROVENT Place 2 sprays into both nostrils as needed for rhinitis.   isosorbide mononitrate 60 MG 24 hr tablet Commonly known as: IMDUR Take 2 tablets (120 mg total) by mouth every evening.   latanoprost 0.005 % ophthalmic solution Commonly known as: XALATAN Place 1 drop into both  eyes at bedtime.   losartan 25 MG tablet Commonly known as: COZAAR Take 1 tablet (25 mg total) by mouth daily.   metFORMIN 500 MG tablet Commonly known as: GLUCOPHAGE Take 500 mg by mouth 2 (two) times daily.   metoprolol succinate 25 MG 24 hr tablet Commonly known as: TOPROL-XL Take 1 tablet (25 mg total) by mouth daily. Pt. Needs to make an appt. With Cardiologist in order to receive further refills. Thank You. 1st Attempt. What changed:  how much to take additional instructions   nitroGLYCERIN 0.4 MG SL tablet Commonly known as: NITROSTAT Place 1 tablet (0.4 mg total) under the tongue every 5 (five) minutes x 3 doses as needed for chest pain.   OXYGEN Inhale 3 L into the lungs at bedtime.   pantoprazole 40 MG tablet Commonly known as: PROTONIX Take 1 tablet (40 mg total) by mouth daily. Take 30-60 min before first meal of the day   spironolactone  25 MG tablet Commonly known as: ALDACTONE Take 1 tablet (25 mg total) by mouth daily.   tamsulosin 0.4 MG Caps capsule Commonly known as: FLOMAX Take 1 capsule (0.4 mg total) by mouth daily after supper.   torsemide 10 MG tablet Commonly known as: DEMADEX Take 10-20 mg by mouth See admin instructions. Take 10 mg daily, may increase to 20 mg as needed for weight gain   Vitamin D (Ergocalciferol) 1.25 MG (50000 UNIT) Caps capsule Commonly known as: DRISDOL Take 1 capsule by mouth every Sunday.   warfarin 2 MG tablet Commonly known as: COUMADIN Take 2 mg by mouth daily.        Allergies:  Allergies  Allergen Reactions   Niacin Itching and Rash    Burning sensation   Contrast Media [Iodinated Contrast Media] Nausea And Vomiting   Iodine-131 Nausea And Vomiting   Nsaids Other (See Comments)    Taking Coumadin    Family History: Family History  Problem Relation Age of Onset   Deep vein thrombosis Father    Lung cancer Sister    Diabetes Sister    Heart disease Sister        After age 46   Hyperlipidemia  Sister    Hypertension Sister    Lung cancer Sister    Breast cancer Sister    Hypertension Mother    Diabetes Sister    Coronary artery disease Other        family hx of   Cancer Brother        "crab cancer"   Heart disease Brother    Heart attack Brother    Heart attack Daughter    Colon cancer Neg Hx    Stroke Neg Hx     Social History:  reports that he quit smoking about 3 years ago. His smoking use included cigarettes. He has a 140.00 pack-year smoking history. He has never used smokeless tobacco. He reports current alcohol use. He reports that he does not use drugs.  ROS: All other review of systems were reviewed and are negative except what is noted above in HPI  Physical Exam: BP (!) 115/59   Pulse 64   Constitutional:  Alert and oriented, No acute distress. HEENT: [place] AT, moist mucus membranes.  Trachea midline, no masses. Cardiovascular: No clubbing, cyanosis, or edema. Respiratory: Normal respiratory effort, no increased work of breathing. GI: Abdomen is soft, nontender, nondistended, no abdominal masses GU: No CVA tenderness.  Lymph: No cervical or inguinal lymphadenopathy. Skin: No rashes, bruises or suspicious lesions. Neurologic: Grossly intact, no focal deficits, moving all 4 extremities. Psychiatric: Normal mood and affect.  Laboratory Data: Lab Results  Component Value Date   WBC 8.8 01/17/2022   HGB 11.3 (L) 01/17/2022   HCT 35.6 (L) 01/17/2022   MCV 89.2 01/17/2022   PLT 202 01/17/2022    Lab Results  Component Value Date   CREATININE 1.04 03/29/2022    No results found for: "PSA"  No results found for: "TESTOSTERONE"  Lab Results  Component Value Date   HGBA1C 6.9 (H) 01/02/2022    Urinalysis    Component Value Date/Time   COLORURINE RED (A) 01/02/2022 0857   APPEARANCEUR Clear 02/19/2022 1046   LABSPEC 1.005 01/02/2022 0857   PHURINE 8.0 01/02/2022 0857   GLUCOSEU Negative 02/19/2022 1046   HGBUR MODERATE (A) 01/02/2022 0857    BILIRUBINUR Negative 02/19/2022 1046   KETONESUR NEGATIVE 01/02/2022 0857   PROTEINUR Negative 02/19/2022 1046   PROTEINUR 30 (A)  01/02/2022 0857   NITRITE Negative 02/19/2022 1046   NITRITE NEGATIVE 01/02/2022 0857   LEUKOCYTESUR Negative 02/19/2022 [place] 01/02/2022 0857    Lab Results  Component Value Date   LABMICR See below: 02/19/2022   WBCUA None seen 02/19/2022   LABEPIT None seen 02/19/2022   MUCUS Present 10/27/2021   BACTERIA None seen 02/19/2022    Pertinent Imaging:  No results found for this or any previous visit.  No results found for this or any previous visit.  No results found for this or any previous visit.  No results found for this or any previous visit.  No results found for this or any previous visit.  No valid procedures specified. Results for orders placed during the hospital encounter of 08/18/21  CT HEMATURIA WORKUP  Narrative CLINICAL DATA:  Gross hematuria since early May, history of prostate cancer * Tracking Code: BO *  EXAM: CT ABDOMEN AND PELVIS WITHOUT AND WITH CONTRAST  TECHNIQUE: Multidetector CT imaging of the abdomen and pelvis was performed following the standard protocol before and following the bolus administration of intravenous contrast.  RADIATION DOSE REDUCTION: This exam was performed according to the departmental dose-optimization program which includes automated exposure control, adjustment of the mA and/or kV according to patient size and/or use of iterative reconstruction technique.  CONTRAST:  168m OMNIPAQUE IOHEXOL 300 MG/ML  SOLN  COMPARISON:  None Available.  FINDINGS: Lower chest: No acute abnormality. Cardiomegaly. Coronary artery calcifications.  Hepatobiliary: No solid liver abnormality is seen. Small, definitively benign cyst or hemangioma of the central liver dome for which no further follow-up or characterization is required (series 15, image 30). No gallstones,  gallbladder wall thickening, or biliary dilatation.  Pancreas: Unremarkable. No pancreatic ductal dilatation or surrounding inflammatory changes.  Spleen: Normal in size without significant abnormality.  Adrenals/Urinary Tract: Adrenal glands are unremarkable. Small simple, benign bilateral renal cortical cysts, as well as intrinsically hyperdense, nonenhancing hemorrhagic or proteinaceous cysts of the midportions of the left and right kidneys (series 2, image 37). No urinary tract filling defect on delayed phase imaging. Bladder is unremarkable.  Stomach/Bowel: Stomach is within normal limits. Appendix appears normal. No evidence of bowel wall thickening, distention, or inflammatory changes. Descending and sigmoid diverticulosis.  Vascular/Lymphatic: Aortic atherosclerosis. 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 No enlarged abdominal or pelvic lymph nodes.  Reproductive: Prostate brachytherapy.  Other: Small, fat containing bilateral inguinal hernias. No ascites.  Musculoskeletal: No acute or significant osseous findings.  IMPRESSION: 1. No evidence of urinary tract mass, calculus, or hydronephrosis. No urinary tract filling defect on delayed phase imaging. 2. Prostate brachytherapy. No evidence of mass or lymphadenopathy in the abdomen or pelvis. 3. Small simple, benign bilateral renal cortical cysts, as well as nonenhancing, benign hemorrhagic or proteinaceous cysts. No further follow-up or characterization is required for these benign renal cysts. 4. 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 Recommend referral to a vascular specialist. This recommendation follows ACR consensus guidelines: White Paper  of the ACR Incidental Findings Committee II on Vascular Findings. J Am Coll Radiol 2013; 10:789-794. 5. Cardiomegaly and coronary artery disease.  Aortic Atherosclerosis (ICD10-I70.0).   Electronically Signed By: Delanna Ahmadi M.D. On: 08/19/2021 11:29  No results found for this or any previous visit.   Assessment & Plan:    1. Gross hematuria -foley catheter removed today  2. Urinary retention -Voiding trial passed today. Followup 1 month with [facility]   No follow-ups on file.  Nicolette Bang, MD  [facility]rology [place]

## 2022-04-09 ENCOUNTER — Ambulatory Visit (INDEPENDENT_AMBULATORY_CARE_PROVIDER_SITE_OTHER): Payer: No Typology Code available for payment source | Admitting: Urology

## 2022-04-09 ENCOUNTER — Telehealth: Payer: Self-pay

## 2022-04-09 VITALS — BP 130/61 | HR 64

## 2022-04-09 DIAGNOSIS — N453 Epididymo-orchitis: Secondary | ICD-10-CM | POA: Diagnosis not present

## 2022-04-09 DIAGNOSIS — N50812 Left testicular pain: Secondary | ICD-10-CM

## 2022-04-09 DIAGNOSIS — N50819 Testicular pain, unspecified: Secondary | ICD-10-CM

## 2022-04-09 DIAGNOSIS — R310 Gross hematuria: Secondary | ICD-10-CM

## 2022-04-09 MED ORDER — DOXYCYCLINE HYCLATE 100 MG PO CAPS: 100.0000 mg | ORAL_CAPSULE | Freq: Two times a day (BID) | ORAL | 0 refills | Status: DC

## 2022-04-09 NOTE — Telephone Encounter (Signed)
Patient is having testicular pain and Made patient aware that I will scheduled him to see MD today. Patient voiced understanding.

## 2022-04-09 NOTE — Progress Notes (Signed)
04/09/2022 11:26 AM   Troy [name] 20-Apr-1940 [id number]  Referring provider: Sharilyn Sites, MD [address] [place],  [place] [address]  Left testicular pain   HPI: Troy [name] is a 82yo here for new onset left testicular pain. The pain is his left testis started 2 days ago and is sharp, constant mild to moderate and nonraditing. No other associated symptoms. No exacerbating/alleviating events. He denies any significant LUTS. No recent gross hematuria. He has urinary incontinence and uses 4-5 pads per day   PMH: Past Medical History:  Diagnosis Date   AAA (abdominal aortic aneurysm) ([place])    Followed by Dr. Sherren Mocha Early   Arthritis    CAD (coronary artery disease)    a. CABG 2000.   Cancer [facility])    Prostate:  Radiation Tx   Carotid artery disease ([place])    Chest pain    precordial. mild chronic .[name]... nonischemic   CHF (congestive heart failure) (HCC)    Chronic edema    Coronary artery disease    a. Nuclear, January, 2008, no ischemia b. Cath 08/2012- 1/4 patent grafts, RCA CTO, no flow-limiting disease, medically managed   Diabetes mellitus without complication ([place])    Dizziness 02/2011   Dyslipidemia    Fall    GERD (gastroesophageal reflux disease)    TAKES TUMS & ROLAIDS AS NEEDED   Heart murmur    Hx of CABG 2000   Hypertension    Myocardial infarction [facility]) 1999   Neck pain 02/2011   Pneumonia    Pulmonary hypertension ([place])    Renal artery stenosis (HCC)    50-70%   S/P femoropopliteal bypass surgery    Dr. Donnetta Hutching   Sinus bradycardia    Asymptomatic    Surgical History: Past Surgical History:  Procedure Laterality Date   CARDIAC CATHETERIZATION  09/26/2012   1/4 patent bypass (occluded SVG-PDA, SVG-OM, LIMA-LAD), SVG-diagonal patent and fills the diagonal and LAD, distal RCA occlusion with left to right collateralization, patent circumflex, LAD with no flow-limiting disease and antegrade flow competitively from SVG-diagonal; EF 60-65%   COLONOSCOPY   11/30/2009   XBJ:[id number]. next TCS 11/2019   COLONOSCOPY WITH PROPOFOL N/A 12/18/2019   Procedure: COLONOSCOPY WITH PROPOFOL;  Surgeon: Harvel Quale, MD;  Location[facility];  Service: Gastroenterology;  Laterality: N/A;  1030   CORONARY ARTERY BYPASS GRAFT  2000   CYSTOSCOPY N/A 08/31/2021   Procedure: CYSTOSCOPY;  Surgeon: Cleon Gustin, MD;  Location: [facility] ORS;  Service: Urology;  Laterality: N/A;  pt knows to arrive at 7:00   [place] N/A 03/22/2022   Procedure: CYSTOSCOPY WITH FULGERATION;  Surgeon: Cleon Gustin, MD;  Location: [facility] ORS;  Service: Urology;  Laterality: N/A;   ESOPHAGOGASTRODUODENOSCOPY N/A 08/12/2013   HQI:[id number] peptic stricture with erosive refluxesophagitis - status post Maloney dilation. Hiatal hernia. Abnormalgastric mucosa. Deformity of the pyloric channel suggestive ofprior peptic ulcer disease. Duodenal bulbar diverticulum Statuspost gastric biopsy. h.pylori   LEFT HEART CATHETERIZATION WITH CORONARY/GRAFT ANGIOGRAM N/A 09/26/2012   Procedure: LEFT HEART CATHETERIZATION WITH Beatrix Fetters;  Surgeon: Burnell Blanks, MD;  Location: [facility] CATH LAB;  Service: Cardiovascular;  Laterality: N/A;   MALONEY DILATION N/A 08/12/2013   Procedure: Venia Minks DILATION;  Surgeon: Daneil Dolin, MD;  Location[facility];  Service: Endoscopy;  Laterality: N/A;   POLYPECTOMY  12/18/2019   Procedure: POLYPECTOMY;  Surgeon: Harvel Quale, MD;  Location: [facility];  Service: Gastroenterology;;  ascending colon polyp  PR VEIN BYPASS GRAFT,AORTO-FEM-POP Right 07/19/1999   PR VEIN BYPASS GRAFT,AORTO-FEM-POP Left 05/02/2006   RIGHT HEART CATH AND CORONARY/GRAFT ANGIOGRAPHY N/A 04/11/2020   Procedure: RIGHT HEART CATH AND CORONARY/GRAFT ANGIOGRAPHY;  Surgeon: Larey Dresser, MD;  Location: [place] CV LAB;  Service: Cardiovascular;  Laterality: N/A;   SAVORY DILATION N/A 08/12/2013   Procedure:  SAVORY DILATION;  Surgeon: Daneil Dolin, MD;  [facility];  Service: Endoscopy;  Laterality: N/A;   TRANSURETHRAL RESECTION OF BLADDER TUMOR N/A 08/31/2021   Procedure: TRANSURETHRAL RESECTION OF BLADDER TUMOR (TURBT);  Surgeon: Cleon Gustin, MD;  Location: [facility] OR[facility];  Service: Urology;  Laterality: N/A;   WRIST SURGERY     cyst removal    Home Medications:  Allergies as of 04/09/2022       Reactions   Niacin Itching, Rash   Burning sensation   Contrast Media [iodinated Contrast Media] Nausea And Vomiting   Iodine-131 Nausea And Vomiting   Nsaids Other (See Comments)   Taking Coumadin        Medication List        Accurate as of April 09, 2022 11:26 AM. If you have any questions, ask your nurse or doctor.          acetaminophen 325 MG tablet Commonly known as: TYLENOL Take 2 tablets (650 mg total) by mouth every 6 (six) hours as needed for mild pain, fever or headache (or Fever >/= 101). What changed: how much to take   albuterol 108 (90 Base) MCG/ACT inhaler Commonly known as: VENTOLIN HFA Inhale 2 puffs into the lungs every 6 (six) hours as needed for wheezing or shortness of breath.   aspirin EC 81 MG tablet Take 1 tablet (81 mg total) by mouth daily with breakfast.   atorvastatin 40 MG tablet Commonly known as: LIPITOR Take 1 tablet (40 mg total) by mouth daily.   doxycycline 100 MG capsule Commonly known as: VIBRAMYCIN Take 1 capsule (100 mg total) by mouth every 12 (twelve) hours. Started by: Nicolette Bang, MD   dutasteride 0.5 MG capsule Commonly known as: AVODART Take 1 capsule (0.5 mg total) by mouth daily.   famotidine 20 MG tablet Commonly known as: Pepcid One after supper What changed:  how much to take how to take this when to take this   HYDROcodone-acetaminophen 10-325 MG tablet Commonly known as: NORCO Take 1 tablet by mouth 2 (two) times daily as needed for moderate pain.   ipratropium 0.03 % nasal spray Commonly  known as: ATROVENT Place 2 sprays into both nostrils as needed for rhinitis.   isosorbide mononitrate 60 MG 24 hr tablet Commonly known as: IMDUR Take 2 tablets (120 mg total) by mouth every evening.   latanoprost 0.005 % ophthalmic solution Commonly known as: XALATAN Place 1 drop into both eyes at bedtime.   losartan 25 MG tablet Commonly known as: COZAAR Take 1 tablet (25 mg total) by mouth daily.   metFORMIN 500 MG tablet Commonly known as: GLUCOPHAGE Take 500 mg by mouth 2 (two) times daily.   metoprolol succinate 25 MG 24 hr tablet Commonly known as: TOPROL-XL Take 1 tablet (25 mg total) by mouth daily. Pt. Needs to make an appt. With Cardiologist in order to receive further refills. Thank You. 1st Attempt. What changed:  how much to take additional instructions   nitroGLYCERIN 0.4 MG SL tablet Commonly known as: NITROSTAT Place 1 tablet (0.4 mg total) under the tongue every 5 (five) minutes x 3  doses as needed for chest pain.   OXYGEN Inhale 3 L into the lungs at bedtime.   pantoprazole 40 MG tablet Commonly known as: PROTONIX Take 1 tablet (40 mg total) by mouth daily. Take 30-60 min before first meal of the day   spironolactone 25 MG tablet Commonly known as: ALDACTONE Take 1 tablet (25 mg total) by mouth daily.   tamsulosin 0.4 MG Caps capsule Commonly known as: FLOMAX Take 1 capsule (0.4 mg total) by mouth daily after supper.   torsemide 10 MG tablet Commonly known as: DEMADEX Take 10-20 mg by mouth See admin instructions. Take 10 mg daily, may increase to 20 mg as needed for weight gain   Vitamin D (Ergocalciferol) 1.25 MG (50000 UNIT) Caps capsule Commonly known as: DRISDOL Take 1 capsule by mouth every Sunday.   warfarin 2 MG tablet Commonly known as: COUMADIN Take 2 mg by mouth daily.        Allergies:  Allergies  Allergen Reactions   Niacin Itching and Rash    Burning sensation   Contrast Media [Iodinated Contrast Media] Nausea And  Vomiting   Iodine-131 Nausea And Vomiting   Nsaids Other (See Comments)    Taking Coumadin    Family History: Family History  Problem Relation Age of Onset   Deep vein thrombosis Father    Lung cancer Sister    Diabetes Sister    Heart disease Sister        After age 57   Hyperlipidemia Sister    Hypertension Sister    Lung cancer Sister    Breast cancer Sister    Hypertension Mother    Diabetes Sister    Coronary artery disease Other        family hx of   Cancer Brother        "crab cancer"   Heart disease Brother    Heart attack Brother    Heart attack Daughter    Colon cancer Neg Hx    Stroke Neg Hx     Social History:  reports that he quit smoking about 3 years ago. His smoking use included cigarettes. He has a 140.00 pack-year smoking history. He has never used smokeless tobacco. He reports current alcohol use. He reports that he does not use drugs.  ROS: All other review of systems were reviewed and are negative except what is noted above in HPI  Physical Exam: BP 130/61   Pulse 64   Constitutional:  Alert and oriented, No acute distress. HEENT: [place] AT, moist mucus membranes.  Trachea midline, no masses. Cardiovascular: No clubbing, cyanosis, or edema. Respiratory: Normal respiratory effort, no increased work of breathing. GI: Abdomen is soft, nontender, nondistended, no abdominal masses GU: No CVA tenderness. Circumcised phallus. No masses/lesions on penis, testis, scrotum. Firm, tender left testis  Lymph: No cervical or inguinal lymphadenopathy. Skin: No rashes, bruises or suspicious lesions. Neurologic: Grossly intact, no focal deficits, moving all 4 extremities. Psychiatric: Normal mood and affect.  Laboratory Data: Lab Results  Component Value Date   WBC 8.8 01/17/2022   HGB 11.3 (L) 01/17/2022   HCT 35.6 (L) 01/17/2022   MCV 89.2 01/17/2022   PLT 202 01/17/2022    Lab Results  Component Value Date   CREATININE 1.04 03/29/2022    No results  found for: "PSA"  No results found for: "TESTOSTERONE"  Lab Results  Component Value Date   HGBA1C 6.9 (H) 01/02/2022    Urinalysis    Component Value Date/Time  COLORURINE RED (A) 01/02/2022 0857   APPEARANCEUR Clear 02/19/2022 1046   LABSPEC 1.005 01/02/2022 0857   PHURINE 8.0 01/02/2022 0857   GLUCOSEU Negative 02/19/2022 1046   HGBUR MODERATE (A) 01/02/2022 0857   BILIRUBINUR Negative 02/19/2022 [place] 01/02/2022 0857   PROTEINUR Negative 02/19/2022 1046   PROTEINUR 30 (A) 01/02/2022 0857   NITRITE Negative 02/19/2022 1046   NITRITE NEGATIVE 01/02/2022 0857   LEUKOCYTESUR Negative 02/19/2022 1046   LEUKOCYTESUR NEGATIVE 01/02/2022 0857    Lab Results  Component Value Date   LABMICR See below: 02/19/2022   WBCUA None seen 02/19/2022   LABEPIT None seen 02/19/2022   MUCUS Present 10/27/2021   BACTERIA None seen 02/19/2022    Pertinent Imaging:  No results found for this or any previous visit.  No results found for this or any previous visit.  No results found for this or any previous visit.  No results found for this or any previous visit.  No results found for this or any previous visit.  No valid procedures specified. Results for orders placed during the hospital encounter of 08/18/21  CT HEMATURIA WORKUP  Narrative CLINICAL DATA:  Gross hematuria since early May, history of prostate cancer * Tracking Code: BO *  EXAM: CT ABDOMEN AND PELVIS WITHOUT AND WITH CONTRAST  TECHNIQUE: Multidetector CT imaging of the abdomen and pelvis was performed following the standard protocol before and following the bolus administration of intravenous contrast.  RADIATION DOSE REDUCTION: This exam was performed according to the departmental dose-optimization program which includes automated exposure control, adjustment of the mA and/or kV according to patient size and/or use of iterative reconstruction technique.  CONTRAST:  15m OMNIPAQUE  IOHEXOL 300 MG/ML  SOLN  COMPARISON:  None Available.  FINDINGS: Lower chest: No acute abnormality. Cardiomegaly. Coronary artery calcifications.  Hepatobiliary: No solid liver abnormality is seen. Small, definitively benign cyst or hemangioma of the central liver dome for which no further follow-up or characterization is required (series 15, image 30). No gallstones, gallbladder wall thickening, or biliary dilatation.  Pancreas: Unremarkable. No pancreatic ductal dilatation or surrounding inflammatory changes.  Spleen: Normal in size without significant abnormality.  Adrenals/Urinary Tract: Adrenal glands are unremarkable. Small simple, benign bilateral renal cortical cysts, as well as intrinsically hyperdense, nonenhancing hemorrhagic or proteinaceous cysts of the midportions of the left and right kidneys (series 2, image 37). No urinary tract filling defect on delayed phase imaging. Bladder is unremarkable.  Stomach/Bowel: Stomach is within normal limits. Appendix appears normal. No evidence of bowel wall thickening, distention, or inflammatory changes. Descending and sigmoid diverticulosis.  Vascular/Lymphatic: Aortic atherosclerosis. 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 No enlarged abdominal or pelvic lymph nodes.  Reproductive: Prostate brachytherapy.  Other: Small, fat containing bilateral inguinal hernias. No ascites.  Musculoskeletal: No acute or significant osseous findings.  IMPRESSION: 1. No evidence of urinary tract mass, calculus, or hydronephrosis. No urinary tract filling defect on delayed phase imaging. 2. Prostate brachytherapy. No evidence of mass or lymphadenopathy in the  abdomen or pelvis. 3. Small simple, benign bilateral renal cortical cysts, as well as nonenhancing, benign hemorrhagic or proteinaceous cysts. No further follow-up or characterization is required for these benign renal cysts. 4. 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 Recommend referral to a vascular specialist. This recommendation follows ACR consensus guidelines: White Paper of the ACR Incidental Findings Committee II on Vascular Findings. J Am Coll Radiol 2013; 10:789-794. 5. Cardiomegaly and coronary artery disease.  Aortic Atherosclerosis (ICD10-I70.0).   Electronically Signed By: Delanna Ahmadi M.D. On: 08/19/2021 11:29  No results found for this or any previous visit.   Assessment & Plan:    1. Pain in testicle, unspecified laterality, left epididymo-orchitis -doxycycline '100mg'$  BID for 14 days  2. Gross hematuria resolved   No follow-ups on file.  Nicolette Bang, MD  [facility] [place]

## 2022-04-10 ENCOUNTER — Encounter: Payer: Self-pay | Admitting: Urology

## 2022-04-10 NOTE — Patient Instructions (Signed)

## 2022-04-16 ENCOUNTER — Ambulatory Visit (INDEPENDENT_AMBULATORY_CARE_PROVIDER_SITE_OTHER): Payer: No Typology Code available for payment source | Admitting: Urology

## 2022-04-16 ENCOUNTER — Encounter: Payer: Self-pay | Admitting: Urology

## 2022-04-16 DIAGNOSIS — R339 Retention of urine, unspecified: Secondary | ICD-10-CM

## 2022-04-16 DIAGNOSIS — R32 Unspecified urinary incontinence: Secondary | ICD-10-CM

## 2022-04-16 NOTE — Addendum Note (Signed)
Addended by: Darcella Gasman R on: 04/16/2022 09:43 AM   Modules accepted: Orders

## 2022-04-16 NOTE — Progress Notes (Signed)
PTNS  Session # 1 of 12  Health & Social Factors: yes Caffeine: 1 cup   Alcohol: very little Daytime voids #per day: 3 Night-time voids #per night: 3 Urgency: yes Incontinence Episodes #per day: once in a while Ankle used: right Treatment Setting: 6 Feeling/ Response: positive sensation  Comments: N/A  Performed By: Marisue Brooklyn, CMA assisted by Levi Aland, CMA  Follow Up: Keep next NV

## 2022-04-16 NOTE — Patient Instructions (Signed)
Orchitis  Orchitis is inflammation of a testicle. Testicles are the male organs that produce sperm. The testicles are held in a fleshy sac (scrotum) located behind the penis. Orchitis usually affects only one testicle, but it can affect both. Orchitis is caused by infection. Many kinds of bacteria and viruses can cause this infection. The condition can develop suddenly. What are the causes? This condition may be caused by: Infection from viruses or bacteria. Other organisms, such as fungi or parasites. This is rare but can happen in men who have a weak body defense system (immune system), such as men who have HIV. Bacteria  Bacterial orchitis often occurs along with an infection of the tube that collects and stores sperm (epididymis). In men who are not sexually active, this infection usually starts as a urinary tract infection and spreads to the testicle. In sexually active men, sexually transmitted infections (STIs) are the most common cause of bacterial orchitis. These can include: Gonorrhea. Chlamydia. Viruses Mumps is the most common cause of viral orchitis, though mumps is now rare in many areas because of vaccination. Other viruses that can cause orchitis include: The chickenpox virus (varicella-zoster virus). The virus that causes mononucleosis (Epstein-Barr virus). What increases the risk? The following factors may make you more likely to develop this condition: For viral orchitis: Not having been vaccinated against mumps. For bacterial orchitis: Having had frequent urinary tract infections. Engaging in high-risk sexual behaviors, such as having multiple sexual partners or having sex without using a condom. Having a sexual partner with an STI. Having had urinary tract surgery. Using a tube that is passed through the penis to drain urine (Foley catheter). Having an enlarged prostate gland. What are the signs or symptoms? The most common symptoms of orchitis are swelling and  pain in the scrotum. Other signs and symptoms may include: Feeling generally sick (malaise). Fever and chills. Painful urination. Painful ejaculation. Headache. Fatigue. Nausea. Blood or discharge from the penis. Swollen lymph nodes in the groin area (inguinal nodes). How is this diagnosed? This condition may be diagnosed based on: Your symptoms. Your health care provider may suspect orchitis if you have a painful, swollen testicle along with other signs and symptoms of the condition. A physical exam. You may also have other tests, including: A blood test to check for signs of infection. A urine test to check for a urinary tract infection or STI. Using a swab to collect a fluid sample from the tip of the penis to test for STIs. Taking an image of the testicle using sound waves and a computer (testicular ultrasound). How is this treated? Treatment for this condition depends on the cause.  For bacterial orchitis, your health care provider may prescribe antibiotic medicines. Bacterial infections usually clear up within a few days. For both viral infections and bacterial infections, treatment may include: Rest. Anti-inflammatory medicines. Pain medicines. Raising (elevating) the scrotum with a towel or pillow and applying ice. Follow these instructions at home: Managing pain and swelling Elevate your scrotum and apply ice as directed. To do this: Put ice in a plastic bag. Place a small towel or pillow between your legs. Rest your scrotum on the pillow or towel. Place another towel between your skin and the plastic bag. Leave the ice on for 20 minutes, 2-3 times a day. Remove the ice if your skin turns bright red. This is very important. If you cannot feel pain, heat, or cold, you have a greater risk of damage to the area. General instructions   Rest as told by your health care provider. Take over-the-counter and prescription medicines only as told by your health care provider. If you  were prescribed an antibiotic medicine, take it as told by your health care provider. Do not stop taking the antibiotic even if you start to feel better. Do not have sex until your health care provider says it is okay to do so. Keep all follow-up visits. This is important. Contact a health care provider if: You have a fever. Pain and swelling have not gotten better after 3 days. Get help right away if: Your pain is getting worse. The swelling in your testicle gets worse. Summary Orchitis is inflammation of a testicle. It is caused by an infection from bacteria or a virus. The most common symptoms of orchitis are swelling and pain in the scrotum. Treatment for this condition depends on the cause. It may include medicines to fight the infection, reduce inflammation, and relieve the pain. Follow your health care provider's instructions about resting, icing, not having sex, and taking medicines. This information is not intended to replace advice given to you by your health care provider. Make sure you discuss any questions you have with your health care provider. Document Revised: 09/27/2020 Document Reviewed: 09/27/2020 Elsevier Patient Education  2023 Elsevier Inc.  

## 2022-04-17 ENCOUNTER — Telehealth: Payer: Self-pay

## 2022-04-17 NOTE — Telephone Encounter (Signed)
I contacted payor and submitted a prior authorization for PTNS.  Authorization #: [id number] Valid 04/16/22-07/05/22 12 treatments

## 2022-04-25 ENCOUNTER — Ambulatory Visit: Payer: Medicare HMO

## 2022-04-25 DIAGNOSIS — Z79899 Other long term (current) drug therapy: Secondary | ICD-10-CM | POA: Diagnosis not present

## 2022-04-26 ENCOUNTER — Ambulatory Visit (INDEPENDENT_AMBULATORY_CARE_PROVIDER_SITE_OTHER): Payer: No Typology Code available for payment source | Admitting: Urology

## 2022-04-26 ENCOUNTER — Ambulatory Visit (INDEPENDENT_AMBULATORY_CARE_PROVIDER_SITE_OTHER): Payer: Medicare HMO | Admitting: Urology

## 2022-04-26 DIAGNOSIS — N3941 Urge incontinence: Secondary | ICD-10-CM | POA: Diagnosis not present

## 2022-04-26 DIAGNOSIS — R339 Retention of urine, unspecified: Secondary | ICD-10-CM

## 2022-04-26 DIAGNOSIS — R310 Gross hematuria: Secondary | ICD-10-CM | POA: Diagnosis not present

## 2022-04-26 NOTE — Progress Notes (Signed)
post void residual=678m  Simple Catheter Placement  Due to urinary retention patient is present today for a foley cath placement.  Patient was cleaned and prepped in a sterile fashion with betadine and 2% lidocaine jelly was instilled into the urethra. A 22 FR coude foley catheter was inserted, urine return was noted  5821m urine was red in color.  The balloon was filled with 10cc of sterile water.  A bed bag was attached for drainage. Patient was also given a night bag to take home and was given instruction on how to change from one bag to another.  Patient was given instruction on proper catheter care.  Patient tolerated well, no complications were noted.  Urine sent for ua and culture.   Performed by: KoLevi AlandCMA  Additional notes/ Follow up: Follow up in 1 week for NV voiding trial.

## 2022-04-26 NOTE — Progress Notes (Signed)
PTNS  Session # 2 of 12  Health & Social Factors: diabetes, CHF, and taking fluid pills Caffeine: 2 cups of coffee Alcohol: very little Daytime voids #per day: several times Night-time voids #per night: 3 Urgency: yes Incontinence Episodes #per day: all the time Ankle used: right Treatment Setting: 2 Feeling/ Response: tingling in leg and some in foot Comments: n/a  Performed By: Levi Aland, CMA  Follow Up: Follow up as scheduled.

## 2022-04-27 ENCOUNTER — Ambulatory Visit (INDEPENDENT_AMBULATORY_CARE_PROVIDER_SITE_OTHER): Payer: No Typology Code available for payment source | Admitting: Urology

## 2022-04-27 DIAGNOSIS — R339 Retention of urine, unspecified: Secondary | ICD-10-CM | POA: Diagnosis not present

## 2022-04-27 LAB — MICROSCOPIC EXAMINATION
Bacteria, UA: NONE SEEN
RBC, Urine: >30 /hpf — AB (ref 0–2)

## 2022-04-27 LAB — URINALYSIS, ROUTINE W REFLEX MICROSCOPIC
Bilirubin, UA: NEGATIVE
Glucose, UA: NEGATIVE
Ketones, UA: NEGATIVE
Nitrite, UA: NEGATIVE
Specific Gravity, UA: 1.015 (ref 1.005–1.030)
Urobilinogen, Ur: 0.2 mg/dL (ref 0.2–1.0)
pH, UA: 7 (ref 5.0–7.5)

## 2022-04-27 NOTE — Progress Notes (Unsigned)
Bladder Irrigation  Due to catheter not draining patient is present today for a bladder irrigation. Patient was cleaned and prepped in a sterile fashion. 400 mL of saline/sterile water was instilled into the bladder with a 75m [place]y syringe through the catheter in place.  6084mof urine return was cleared from the bladder.  Upon completion, the catheter was draining well and was reattached to the bed bag for drainage. Patient tolerated well.   Performed by: KoLevi AlandCMA  Additional notes/ Follow up: Follow up as scheduled for NV voiding trial per MD.  Patient was educated on how to irrigate his catheter at home and provided with supplies.  He will call if he has any issues prior to his next scheduled apt.

## 2022-04-28 LAB — URINE CULTURE

## 2022-05-01 ENCOUNTER — Ambulatory Visit: Payer: Medicare HMO

## 2022-05-02 ENCOUNTER — Ambulatory Visit (INDEPENDENT_AMBULATORY_CARE_PROVIDER_SITE_OTHER): Payer: No Typology Code available for payment source | Admitting: Urology

## 2022-05-02 DIAGNOSIS — R339 Retention of urine, unspecified: Secondary | ICD-10-CM | POA: Diagnosis not present

## 2022-05-02 NOTE — Progress Notes (Signed)
Fill and Pull Catheter Removal  Patient is present today for a catheter removal.  Due to bladder spasms patient was unable to complete voiding trial. 48m of water was then drained from the balloon.  A 22FR coude foley cath was removed from the bladder no complications were noted .  Patient tolerated well.  Performed by: KLevi Aland CMA  Follow up/ Additional notes: Patient instructed to return to office by 1500 for a PVR.  He is aware to return sooner if he is unable to void before then.   Patient returned for bladderscan.  PVR=30.  Patient will follow up in 1 week for PVR and 1 month with MD.

## 2022-05-06 ENCOUNTER — Other Ambulatory Visit: Payer: Self-pay

## 2022-05-06 ENCOUNTER — Encounter (HOSPITAL_COMMUNITY): Payer: Self-pay | Admitting: Emergency Medicine

## 2022-05-06 ENCOUNTER — Inpatient Hospital Stay (HOSPITAL_COMMUNITY)
Admission: EM | Admit: 2022-05-06 | Discharge: 2022-05-11 | DRG: 728 | Disposition: A | Discharge status: Home Health | Payer: No Typology Code available for payment source | Attending: Internal Medicine | Admitting: Internal Medicine

## 2022-05-06 ENCOUNTER — Emergency Department (HOSPITAL_COMMUNITY): Payer: No Typology Code available for payment source

## 2022-05-06 DIAGNOSIS — Z7901 Long term (current) use of anticoagulants: Secondary | ICD-10-CM | POA: Diagnosis not present

## 2022-05-06 DIAGNOSIS — Z91041 Radiographic dye allergy status: Secondary | ICD-10-CM

## 2022-05-06 DIAGNOSIS — Z9981 Dependence on supplemental oxygen: Secondary | ICD-10-CM

## 2022-05-06 DIAGNOSIS — Z87891 Personal history of nicotine dependence: Secondary | ICD-10-CM | POA: Diagnosis not present

## 2022-05-06 DIAGNOSIS — B965 Pseudomonas (aeruginosa) (mallei) (pseudomallei) as the cause of diseases classified elsewhere: Secondary | ICD-10-CM | POA: Diagnosis present

## 2022-05-06 DIAGNOSIS — I959 Hypotension, unspecified: Secondary | ICD-10-CM | POA: Diagnosis not present

## 2022-05-06 DIAGNOSIS — Z8546 Personal history of malignant neoplasm of prostate: Secondary | ICD-10-CM | POA: Diagnosis not present

## 2022-05-06 DIAGNOSIS — Z8249 Family history of ischemic heart disease and other diseases of the circulatory system: Secondary | ICD-10-CM

## 2022-05-06 DIAGNOSIS — N139 Obstructive and reflux uropathy, unspecified: Secondary | ICD-10-CM | POA: Diagnosis present

## 2022-05-06 DIAGNOSIS — I10 Essential (primary) hypertension: Secondary | ICD-10-CM | POA: Diagnosis not present

## 2022-05-06 DIAGNOSIS — I252 Old myocardial infarction: Secondary | ICD-10-CM

## 2022-05-06 DIAGNOSIS — E785 Hyperlipidemia, unspecified: Secondary | ICD-10-CM | POA: Diagnosis present

## 2022-05-06 DIAGNOSIS — E119 Type 2 diabetes mellitus without complications: Secondary | ICD-10-CM

## 2022-05-06 DIAGNOSIS — I2510 Atherosclerotic heart disease of native coronary artery without angina pectoris: Secondary | ICD-10-CM | POA: Diagnosis present

## 2022-05-06 DIAGNOSIS — Z91048 Other nonmedicinal substance allergy status: Secondary | ICD-10-CM

## 2022-05-06 DIAGNOSIS — I110 Hypertensive heart disease with heart failure: Secondary | ICD-10-CM | POA: Diagnosis present

## 2022-05-06 DIAGNOSIS — Z7982 Long term (current) use of aspirin: Secondary | ICD-10-CM

## 2022-05-06 DIAGNOSIS — Z79899 Other long term (current) drug therapy: Secondary | ICD-10-CM

## 2022-05-06 DIAGNOSIS — K429 Umbilical hernia without obstruction or gangrene: Secondary | ICD-10-CM | POA: Diagnosis not present

## 2022-05-06 DIAGNOSIS — N3941 Urge incontinence: Secondary | ICD-10-CM | POA: Diagnosis not present

## 2022-05-06 DIAGNOSIS — N452 Orchitis: Principal | ICD-10-CM

## 2022-05-06 DIAGNOSIS — I272 Pulmonary hypertension, unspecified: Secondary | ICD-10-CM | POA: Diagnosis present

## 2022-05-06 DIAGNOSIS — Z833 Family history of diabetes mellitus: Secondary | ICD-10-CM

## 2022-05-06 DIAGNOSIS — N390 Urinary tract infection, site not specified: Secondary | ICD-10-CM | POA: Diagnosis present

## 2022-05-06 DIAGNOSIS — Z83438 Family history of other disorder of lipoprotein metabolism and other lipidemia: Secondary | ICD-10-CM

## 2022-05-06 DIAGNOSIS — I5031 Acute diastolic (congestive) heart failure: Secondary | ICD-10-CM | POA: Diagnosis present

## 2022-05-06 DIAGNOSIS — E669 Obesity, unspecified: Secondary | ICD-10-CM | POA: Diagnosis present

## 2022-05-06 DIAGNOSIS — N453 Epididymo-orchitis: Principal | ICD-10-CM | POA: Diagnosis present

## 2022-05-06 DIAGNOSIS — I5032 Chronic diastolic (congestive) heart failure: Secondary | ICD-10-CM | POA: Diagnosis present

## 2022-05-06 DIAGNOSIS — I4891 Unspecified atrial fibrillation: Secondary | ICD-10-CM | POA: Diagnosis present

## 2022-05-06 DIAGNOSIS — Z7984 Long term (current) use of oral hypoglycemic drugs: Secondary | ICD-10-CM

## 2022-05-06 DIAGNOSIS — I714 Abdominal aortic aneurysm, without rupture, unspecified: Secondary | ICD-10-CM | POA: Diagnosis not present

## 2022-05-06 DIAGNOSIS — Z888 Allergy status to other drugs, medicaments and biological substances status: Secondary | ICD-10-CM

## 2022-05-06 DIAGNOSIS — K219 Gastro-esophageal reflux disease without esophagitis: Secondary | ICD-10-CM | POA: Diagnosis present

## 2022-05-06 DIAGNOSIS — K439 Ventral hernia without obstruction or gangrene: Secondary | ICD-10-CM | POA: Diagnosis not present

## 2022-05-06 DIAGNOSIS — Z6831 Body mass index (BMI) 31.0-31.9, adult: Secondary | ICD-10-CM

## 2022-05-06 DIAGNOSIS — L729 Follicular cyst of the skin and subcutaneous tissue, unspecified: Secondary | ICD-10-CM | POA: Diagnosis present

## 2022-05-06 DIAGNOSIS — R319 Hematuria, unspecified: Secondary | ICD-10-CM | POA: Diagnosis present

## 2022-05-06 DIAGNOSIS — R5381 Other malaise: Secondary | ICD-10-CM | POA: Diagnosis present

## 2022-05-06 DIAGNOSIS — Z886 Allergy status to analgesic agent status: Secondary | ICD-10-CM

## 2022-05-06 DIAGNOSIS — N5089 Other specified disorders of the male genital organs: Secondary | ICD-10-CM | POA: Diagnosis not present

## 2022-05-06 DIAGNOSIS — N50811 Right testicular pain: Secondary | ICD-10-CM | POA: Diagnosis not present

## 2022-05-06 DIAGNOSIS — Z951 Presence of aortocoronary bypass graft: Secondary | ICD-10-CM

## 2022-05-06 LAB — URINALYSIS, ROUTINE W REFLEX MICROSCOPIC
Bilirubin Urine: NEGATIVE
Glucose, UA: NEGATIVE mg/dL
Ketones, ur: NEGATIVE mg/dL
Nitrite: NEGATIVE
Protein, ur: 30 mg/dL — AB
RBC / HPF: >50 RBC/hpf (ref 0–5)
Specific Gravity, Urine: 1.013 (ref 1.005–1.030)
pH: 9 — ABNORMAL HIGH (ref 5.0–8.0)

## 2022-05-06 LAB — CBC WITH DIFFERENTIAL/PLATELET
Abs Immature Granulocytes: 0.2 10*3/uL — ABNORMAL HIGH (ref 0.00–0.07)
Basophils Absolute: 0.1 10*3/uL (ref 0.0–0.1)
Basophils Relative: 0 %
Eosinophils Absolute: 0 10*3/uL (ref 0.0–0.5)
Eosinophils Relative: 0 %
HCT: 35.7 % — ABNORMAL LOW (ref 39.0–52.0)
Hemoglobin: 11.7 g/dL — ABNORMAL LOW (ref 13.0–17.0)
Immature Granulocytes: 1 %
Lymphocytes Relative: 4 %
Lymphs Abs: 0.6 10*3/uL — ABNORMAL LOW (ref 0.7–4.0)
MCH: 28.3 pg (ref 26.0–34.0)
MCHC: 32.8 g/dL (ref 30.0–36.0)
MCV: 86.2 fL (ref 80.0–100.0)
Monocytes Absolute: 1.3 10*3/uL — ABNORMAL HIGH (ref 0.1–1.0)
Monocytes Relative: 8 %
Neutro Abs: 14.9 10*3/uL — ABNORMAL HIGH (ref 1.7–7.7)
Neutrophils Relative %: 87 %
Platelets: 194 10*3/uL (ref 150–400)
RBC: 4.14 MIL/uL — ABNORMAL LOW (ref 4.22–5.81)
RDW: 15.9 % — ABNORMAL HIGH (ref 11.5–15.5)
WBC: 17.1 10*3/uL — ABNORMAL HIGH (ref 4.0–10.5)
nRBC: 0 % (ref 0.0–0.2)

## 2022-05-06 LAB — PHOSPHORUS: Phosphorus: 1.6 mg/dL — ABNORMAL LOW (ref 2.5–4.6)

## 2022-05-06 LAB — GLUCOSE, CAPILLARY
Glucose-Capillary: 179 mg/dL — ABNORMAL HIGH (ref 70–99)
Glucose-Capillary: 180 mg/dL — ABNORMAL HIGH (ref 70–99)

## 2022-05-06 LAB — COMPREHENSIVE METABOLIC PANEL
ALT: 11 U/L (ref 0–44)
AST: 29 U/L (ref 15–41)
Albumin: 3.6 g/dL (ref 3.5–5.0)
Alkaline Phosphatase: 75 U/L (ref 38–126)
Anion gap: 11 (ref 5–15)
BUN: 22 mg/dL (ref 8–23)
CO2: 25 mmol/L (ref 22–32)
Calcium: 8.8 mg/dL — ABNORMAL LOW (ref 8.9–10.3)
Chloride: 101 mmol/L (ref 98–111)
Creatinine, Ser: 1.08 mg/dL (ref 0.61–1.24)
GFR, Estimated: >60 mL/min (ref >60)
Glucose, Bld: 201 mg/dL — ABNORMAL HIGH (ref 70–99)
Potassium: 3.8 mmol/L (ref 3.5–5.1)
Sodium: 137 mmol/L (ref 135–145)
Total Bilirubin: 0.8 mg/dL (ref 0.3–1.2)
Total Protein: 7.2 g/dL (ref 6.5–8.1)

## 2022-05-06 LAB — PROTIME-INR
INR: 3 — ABNORMAL HIGH (ref 0.8–1.2)
Prothrombin Time: 31.1 seconds — ABNORMAL HIGH (ref 11.4–15.2)

## 2022-05-06 LAB — HEMOGLOBIN A1C
Hgb A1c MFr Bld: 6.9 % — ABNORMAL HIGH (ref 4.8–5.6)
Mean Plasma Glucose: 151.33 mg/dL

## 2022-05-06 LAB — MAGNESIUM: Magnesium: 1.2 mg/dL — ABNORMAL LOW (ref 1.7–2.4)

## 2022-05-06 LAB — CBG MONITORING, ED: Glucose-Capillary: 150 mg/dL — ABNORMAL HIGH (ref 70–99)

## 2022-05-06 MED ORDER — LEVALBUTEROL HCL 0.63 MG/3ML IN NEBU: 0.6300 mg | INHALATION_SOLUTION | Freq: Four times a day (QID) | RESPIRATORY_TRACT | Status: DC | PRN

## 2022-05-06 MED ORDER — ONDANSETRON HCL 4 MG PO TABS
4.0000 mg | ORAL_TABLET | Freq: Four times a day (QID) | ORAL | Status: DC | PRN
  Administered 2022-05-07: 4 mg via ORAL
  Filled 2022-05-06: qty 1

## 2022-05-06 MED ORDER — LACTATED RINGERS IV BOLUS
500.0000 mL | Freq: Once | INTRAVENOUS | Status: AC
  Administered 2022-05-06: 500 mL via INTRAVENOUS

## 2022-05-06 MED ORDER — ONDANSETRON HCL 4 MG/2ML IJ SOLN
4.0000 mg | Freq: Once | INTRAMUSCULAR | Status: AC
  Administered 2022-05-06: 4 mg via INTRAVENOUS
  Filled 2022-05-06: qty 2

## 2022-05-06 MED ORDER — ATORVASTATIN CALCIUM 40 MG PO TABS
40.0000 mg | ORAL_TABLET | Freq: Every day | ORAL | Status: DC
  Administered 2022-05-07 – 2022-05-11 (×5): 40 mg via ORAL
  Filled 2022-05-06 (×5): qty 1

## 2022-05-06 MED ORDER — ACETAMINOPHEN 650 MG RE SUPP: 650.0000 mg | Freq: Four times a day (QID) | RECTAL | Status: DC | PRN

## 2022-05-06 MED ORDER — ONDANSETRON HCL 4 MG/2ML IJ SOLN
4.0000 mg | Freq: Four times a day (QID) | INTRAMUSCULAR | Status: DC | PRN
  Administered 2022-05-06 – 2022-05-07 (×2): 4 mg via INTRAVENOUS
  Filled 2022-05-06 (×2): qty 2

## 2022-05-06 MED ORDER — SODIUM CHLORIDE 0.9% FLUSH
3.0000 mL | Freq: Two times a day (BID) | INTRAVENOUS | Status: DC
  Administered 2022-05-06 – 2022-05-11 (×8): 3 mL via INTRAVENOUS

## 2022-05-06 MED ORDER — LATANOPROST 0.005 % OP SOLN: 1.0000 [drp] | Freq: Every day | OPHTHALMIC | Status: DC

## 2022-05-06 MED ORDER — DUTASTERIDE 0.5 MG PO CAPS
0.5000 mg | ORAL_CAPSULE | Freq: Every day | ORAL | Status: DC
  Administered 2022-05-07 – 2022-05-11 (×5): 0.5 mg via ORAL
  Filled 2022-05-06 (×8): qty 1

## 2022-05-06 MED ORDER — SODIUM CHLORIDE 0.9 % IV SOLN
3.0000 g | Freq: Four times a day (QID) | INTRAVENOUS | Status: DC
  Administered 2022-05-06 – 2022-05-09 (×11): 3 g via INTRAVENOUS
  Filled 2022-05-06 (×11): qty 8

## 2022-05-06 MED ORDER — HYDROCODONE-ACETAMINOPHEN 10-325 MG PO TABS
1.0000 | ORAL_TABLET | Freq: Two times a day (BID) | ORAL | Status: DC | PRN
  Administered 2022-05-07 (×2): 1 via ORAL
  Filled 2022-05-06 (×2): qty 1

## 2022-05-06 MED ORDER — MAGNESIUM SULFATE 2 GM/50ML IV SOLN
2.0000 g | Freq: Once | INTRAVENOUS | Status: AC
  Administered 2022-05-06: 2 g via INTRAVENOUS
  Filled 2022-05-06: qty 50

## 2022-05-06 MED ORDER — HEPARIN SODIUM (PORCINE) 5000 UNIT/ML IJ SOLN: 5000.0000 [IU] | Freq: Three times a day (TID) | INTRAMUSCULAR | Status: DC

## 2022-05-06 MED ORDER — RISAQUAD PO CAPS
2.0000 | ORAL_CAPSULE | Freq: Three times a day (TID) | ORAL | Status: DC
  Administered 2022-05-06 – 2022-05-11 (×15): 2 via ORAL
  Filled 2022-05-06 (×15): qty 2

## 2022-05-06 MED ORDER — BISACODYL 5 MG PO TBEC
5.0000 mg | DELAYED_RELEASE_TABLET | Freq: Every day | ORAL | Status: DC | PRN
  Administered 2022-05-08 – 2022-05-11 (×2): 5 mg via ORAL
  Filled 2022-05-06 (×2): qty 1

## 2022-05-06 MED ORDER — SODIUM CHLORIDE 0.9 % IV SOLN: 3.0000 g | Freq: Three times a day (TID) | INTRAVENOUS | Status: DC

## 2022-05-06 MED ORDER — HYDROMORPHONE HCL 1 MG/ML IJ SOLN: 0.5000 mg | INTRAMUSCULAR | Status: DC | PRN

## 2022-05-06 MED ORDER — ACETAMINOPHEN 325 MG PO TABS: 650.0000 mg | ORAL_TABLET | Freq: Four times a day (QID) | ORAL | Status: DC | PRN

## 2022-05-06 MED ORDER — TORSEMIDE 20 MG PO TABS: 10.0000 mg | ORAL_TABLET | ORAL | Status: DC

## 2022-05-06 MED ORDER — SODIUM CHLORIDE 0.9 % IV SOLN: 250.0000 mL | INTRAVENOUS | Status: DC | PRN

## 2022-05-06 MED ORDER — HYDROMORPHONE HCL 1 MG/ML IJ SOLN
1.0000 mg | INTRAMUSCULAR | Status: DC | PRN
  Administered 2022-05-06 – 2022-05-07 (×3): 1 mg via INTRAVENOUS
  Filled 2022-05-06 (×3): qty 1

## 2022-05-06 MED ORDER — ASPIRIN 81 MG PO TBEC: 81.0000 mg | DELAYED_RELEASE_TABLET | Freq: Every day | ORAL | Status: DC

## 2022-05-06 MED ORDER — SENNOSIDES-DOCUSATE SODIUM 8.6-50 MG PO TABS
1.0000 | ORAL_TABLET | Freq: Every evening | ORAL | Status: DC | PRN
  Administered 2022-05-11: 1 via ORAL
  Filled 2022-05-06: qty 1

## 2022-05-06 MED ORDER — DOXYCYCLINE HYCLATE 100 MG PO CAPS: 100.0000 mg | ORAL_CAPSULE | Freq: Two times a day (BID) | ORAL | Status: DC

## 2022-05-06 MED ORDER — TAMSULOSIN HCL 0.4 MG PO CAPS
0.4000 mg | ORAL_CAPSULE | Freq: Every day | ORAL | Status: DC
  Administered 2022-05-06 – 2022-05-11 (×6): 0.4 mg via ORAL
  Filled 2022-05-06 (×6): qty 1

## 2022-05-06 MED ORDER — INSULIN ASPART 100 UNIT/ML IJ SOLN
0.0000 [IU] | Freq: Three times a day (TID) | INTRAMUSCULAR | Status: DC
  Administered 2022-05-06: 2 [IU] via SUBCUTANEOUS
  Administered 2022-05-07 (×3): 1 [IU] via SUBCUTANEOUS
  Administered 2022-05-08: 3 [IU] via SUBCUTANEOUS
  Administered 2022-05-09 (×3): 1 [IU] via SUBCUTANEOUS
  Administered 2022-05-10 (×2): 2 [IU] via SUBCUTANEOUS
  Administered 2022-05-11: 1 [IU] via SUBCUTANEOUS

## 2022-05-06 MED ORDER — METOPROLOL SUCCINATE ER 25 MG PO TB24
12.5000 mg | ORAL_TABLET | Freq: Every day | ORAL | Status: DC
  Administered 2022-05-07 – 2022-05-11 (×5): 12.5 mg via ORAL
  Filled 2022-05-06 (×5): qty 1

## 2022-05-06 MED ORDER — IPRATROPIUM BROMIDE 0.02 % IN SOLN: 0.5000 mg | Freq: Four times a day (QID) | RESPIRATORY_TRACT | Status: DC | PRN

## 2022-05-06 MED ORDER — LEVOFLOXACIN IN D5W 500 MG/100ML IV SOLN
500.0000 mg | Freq: Once | INTRAVENOUS | Status: AC
  Administered 2022-05-06: 500 mg via INTRAVENOUS
  Filled 2022-05-06: qty 100

## 2022-05-06 MED ORDER — LOSARTAN POTASSIUM 50 MG PO TABS
25.0000 mg | ORAL_TABLET | Freq: Every day | ORAL | Status: DC
  Administered 2022-05-07 – 2022-05-11 (×5): 25 mg via ORAL
  Filled 2022-05-06 (×5): qty 1

## 2022-05-06 MED ORDER — TRAZODONE HCL 50 MG PO TABS: 25.0000 mg | ORAL_TABLET | Freq: Every evening | ORAL | Status: DC | PRN

## 2022-05-06 MED ORDER — FENTANYL CITRATE (PF) 100 MCG/2ML IJ SOLN
100.0000 ug | Freq: Once | INTRAMUSCULAR | Status: AC
  Administered 2022-05-06: 100 ug via INTRAVENOUS
  Filled 2022-05-06: qty 2

## 2022-05-06 MED ORDER — LATANOPROST 0.005 % OP SOLN
1.0000 [drp] | Freq: Every day | OPHTHALMIC | Status: DC
  Administered 2022-05-06 – 2022-05-10 (×5): 1 [drp] via OPHTHALMIC
  Filled 2022-05-06 (×3): qty 2.5

## 2022-05-06 MED ORDER — SODIUM CHLORIDE 0.9 % IV SOLN: INTRAVENOUS | Status: DC

## 2022-05-06 MED ORDER — ALUM & MAG HYDROXIDE-SIMETH 200-200-20 MG/5ML PO SUSP: 30.0000 mL | Freq: Four times a day (QID) | ORAL | Status: DC | PRN

## 2022-05-06 MED ORDER — OXYCODONE HCL 5 MG PO TABS: 5.0000 mg | ORAL_TABLET | ORAL | Status: DC | PRN

## 2022-05-06 MED ORDER — WARFARIN SODIUM 2 MG PO TABS
2.0000 mg | ORAL_TABLET | Freq: Every day | ORAL | Status: DC
  Administered 2022-05-06 – 2022-05-07 (×2): 2 mg via ORAL
  Filled 2022-05-06 (×3): qty 1

## 2022-05-06 MED ORDER — ISOSORBIDE MONONITRATE ER 60 MG PO TB24
120.0000 mg | ORAL_TABLET | Freq: Every evening | ORAL | Status: DC
  Administered 2022-05-06 – 2022-05-11 (×6): 120 mg via ORAL
  Filled 2022-05-06 (×7): qty 2

## 2022-05-06 MED ORDER — HYDRALAZINE HCL 20 MG/ML IJ SOLN
10.0000 mg | INTRAMUSCULAR | Status: DC | PRN
  Administered 2022-05-10: 10 mg via INTRAVENOUS
  Filled 2022-05-06: qty 1

## 2022-05-06 MED ORDER — CEFTRIAXONE SODIUM 1 G IJ SOLR: 1.0000 g | INTRAMUSCULAR | Status: DC

## 2022-05-06 MED ORDER — WARFARIN - PHARMACIST DOSING INPATIENT: Freq: Every day | Status: DC

## 2022-05-06 MED ORDER — SODIUM CHLORIDE 0.9% FLUSH: 3.0000 mL | INTRAVENOUS | Status: DC | PRN

## 2022-05-06 MED ORDER — SODIUM CHLORIDE 0.9% FLUSH
3.0000 mL | Freq: Two times a day (BID) | INTRAVENOUS | Status: DC
  Administered 2022-05-08 – 2022-05-11 (×7): 3 mL via INTRAVENOUS

## 2022-05-06 MED ORDER — METFORMIN HCL 500 MG PO TABS: 500.0000 mg | ORAL_TABLET | Freq: Two times a day (BID) | ORAL | Status: DC

## 2022-05-06 MED ORDER — NITROGLYCERIN 0.4 MG SL SUBL: 0.4000 mg | SUBLINGUAL_TABLET | SUBLINGUAL | Status: DC | PRN

## 2022-05-06 MED ORDER — TORSEMIDE 20 MG PO TABS
20.0000 mg | ORAL_TABLET | Freq: Every day | ORAL | Status: DC
  Administered 2022-05-06 – 2022-05-10 (×5): 20 mg via ORAL
  Filled 2022-05-06 (×5): qty 1

## 2022-05-06 MED ORDER — FLEET ENEMA 7-19 GM/118ML RE ENEM: 1.0000 | ENEMA | Freq: Once | RECTAL | Status: DC | PRN

## 2022-05-06 NOTE — Assessment & Plan Note (Signed)
-  History of urinary retention, -Monitoring closely, in and out as needed, if continues to progress anticipate temporary Foley catheter placement

## 2022-05-06 NOTE — Assessment & Plan Note (Addendum)
-  Continuing IV antibiotics, pain management -Afebrile, normotensive, WBC 25.9, -Continuing with IV antibiotics of Unasyn -Pending cultures urine and blood   -Gentle IV fluid hydration  Testicular ultrasound: MPRESSION: 1. New heterogeneity of the right testis and mild right testicular hyperemia suggesting orchitis. 2. No evidence of testicular torsion.  -Follow-up with urine cultures -Consulted urologist --- appreciate further evaluation recommendations His primary urologist Dr. [name]

## 2022-05-06 NOTE — Assessment & Plan Note (Signed)
Continue statins 

## 2022-05-06 NOTE — Progress Notes (Signed)
Pharmacy Antibiotic Note  Troy [name] is a 82 y.o. male admitted on 05/06/2022 with  orchitis/epididymitis .  Pharmacy has been consulted for unasyn dosing. Patient had recent UTI 01/17/22 UCX =>> E. faecalis, pan sensitive . One dose levaquin IV given in ED. Recently treated with doxycyline for UTI 04/19/22.   Plan: Unasyn 3gm IV q6h F/u cxs and clinical progress Monitor V/S, labs and levels as indicated  Height: '5\' 10"'$  (177.8 cm) Weight: 91.6 kg (202 lb) IBW/kg (Calculated) : 73  Temp (24hrs), Avg:98.4 F (36.9 C), Min:98.4 F (36.9 C), Max:98.4 F (36.9 C)  Recent Labs  Lab 05/06/22 1047  WBC 17.1*  CREATININE 1.08    Estimated Creatinine Clearance: 63.1 mL/min (by C-G formula based on SCr of 1.08 mg/dL).    Allergies  Allergen Reactions   Niacin Itching and Rash    Burning sensation   Contrast Media [Iodinated Contrast Media] Nausea And Vomiting   Iodine-131 Nausea And Vomiting   Nsaids Other (See Comments)    Taking Coumadin    Antimicrobials this admission: Unasyn 2/4 >>  Levaquin 2/4 x 1 dose  Microbiology results: 2/4 BCx: pending 2/4 UCx: pending   Thank you for allowing pharmacy to be a part of this patient's care.  Isac Sarna, BS Pharm D, BCPS Clinical Pharmacist 05/06/2022 3:47 PM

## 2022-05-06 NOTE — Hospital Course (Signed)
Troy [name] is a 82 year old male with extensive history of HLD, CABG, CAD, DM 2, HTN, A-fib (on Coumadin), chronic anemia, epididymoorchitis .[name]... Presented with chief complaint of right testicular pain.  Patient reports that he has had some urinary transient and epididymoorchitis in August 2023 was seen and treated by urologist Dr. [name].  Apparently he has visited Dr. [name] in past week for urinary retention Foley catheter was placed on January 25th, currently was follow-up for catheter obstruction which cleared by flushing.  He was seen in office again 5 days ago for catheter removal, and post void residual on May 01, 2022. Urine culture from January 25 has been negative.... Patient reports that he has been able to drain his bladder, has some incontinency.  He is bearing briefs..  Noted he was passing blood clots last week which has resolved.  Today he is reporting of significant right testicular pain started last night.  Tylenol did not help.  This morning was quite worse was associate with nausea vomiting but no fever.   ED course: Blood pressure 132/61, pulse 76, temperature 98.4 F (36.9 C), resp. rate 20, SpO2 97 % On RA.  BMP within normal limits with exception of glucose of 201, calcium 8.8, magnesium 1.2, WBC 17.1, UA small hemoglobin, moderate leukocytosis, negative for any nitrites, >50 RBC, rare bacteria, WBC 11-20  Scrotal ultrasound: 4 mm peripheral right scrotal cyst possibly along the rete testis. New heterogeneity of the right testis and mild right testicular hyperemia suggesting orchitis.   Requested patient to be admitted for IV antibiotics, pain management.

## 2022-05-06 NOTE — ED Notes (Signed)
Pt reports that he had a urinary catheter removed last week.

## 2022-05-06 NOTE — Progress Notes (Signed)
ANTICOAGULATION CONSULT NOTE - Initial Consult  Pharmacy Consult for Warfarin Indication: atrial fibrillation  Allergies  Allergen Reactions   Niacin Itching and Rash    Burning sensation   Contrast Media [Iodinated Contrast Media] Nausea And Vomiting   Iodine-131 Nausea And Vomiting   Nsaids Other (See Comments)    Taking Coumadin    Patient Measurements: Height: '5\' 10"'$  (177.8 cm) Weight: 91.6 kg (202 lb) IBW/kg (Calculated) : 73  Vital Signs: Temp: 98.4 F (36.9 C) (02/04 1340) Temp Source: Oral (02/04 0940) BP: 135/72 (02/04 1430) Pulse Rate: 35 (02/04 1430)  Labs: Recent Labs    05/06/22 1047  HGB 11.7*  HCT 35.7*  PLT 194  LABPROT 31.1*  INR 3.0*  CREATININE 1.08    Estimated Creatinine Clearance: 63.1 mL/min (by C-G formula based on SCr of 1.08 mg/dL).   Medical History: Past Medical History:  Diagnosis Date   AAA (abdominal aortic aneurysm) ([place])    Followed by Dr. Sherren Mocha Early   Arthritis    CAD (coronary artery disease)    a. CABG 2000.   Cancer [facility])    Prostate:  Radiation Tx   Carotid artery disease ([place])    Chest pain    precordial. mild chronic .[name]... nonischemic   CHF (congestive heart failure) ([facility])    Chronic edema    Coronary artery disease    a. Nuclear, January, 2008, no ischemia b. Cath 08/2012- 1/4 patent grafts, RCA CTO, no flow-limiting disease, medically managed   Diabetes mellitus without complication ([place])    Dizziness 02/2011   Dyslipidemia    Fall    GERD (gastroesophageal reflux disease)    TAKES TUMS & ROLAIDS AS NEEDED   Heart murmur    Hx of CABG 2000   Hypertension    Myocardial infarction ([place]) 1999   Neck pain 02/2011   Pneumonia    Pulmonary hypertension ([place])    Renal artery stenosis ([facility])    50-70%   S/P femoropopliteal bypass surgery    Dr. Donnetta Hutching   Sinus bradycardia    Asymptomatic    Medications:  See med rec  Assessment: Presented with chief complaint of right testicular pain. He has had urinary  issues recently  and was passing blood clots. He is chronically anticoagulated for afib with warfarin. Patient told me he tries to keep INR around 2.5-3. INR today is 3. Likely with antibiotics starting for presumed orchitis/epipidiymitis would anticipate INR increasing so may need to adjust dose. Pharmacy asked to dose Warfarin.   INR 3, therapeutic. No bleeding or clots today.   Home dose is '2mg'$  daily  Goal of Therapy:  INR 2-3 Monitor platelets by anticoagulation protocol: Yes   Plan:  Warfarin '2mg'$  daily PO PT-INR daily Monitor for S/S of bleeding  Isac Sarna, BS [place], BCPS Clinical Pharmacist 05/06/2022,3:36 PM

## 2022-05-06 NOTE — ED Triage Notes (Signed)
Patient brought in via EMS from home. Alert and oriented. Airway patent. Patient c/o right testicle pain and swelling. Per patient progressively getting worse. Per patient polyuria.

## 2022-05-06 NOTE — Assessment & Plan Note (Signed)
-  Will continue home medications including losartan,  Aldactone -Demadex on hold will restart in a.m. -Currently patient needs gentle IV hydration for leukocytosis, orchitis infection

## 2022-05-06 NOTE — H&P (Signed)
History and Physical   Patient: Troy [name]                            PCP: Sharilyn Sites, MD                    DOB: December 05, 1940            DOA: 05/06/2022 [id number]             DOS: 05/06/2022, 3:14 PM  Sharilyn Sites, MD  Patient coming from:   HOME  I have personally reviewed patient's medical records, in electronic medical records, including:  [facility], and care everywhere.    Chief Complaint:   Chief Complaint  Patient presents with   Testicle Pain    History of present illness:    Troy [name] is a 82 year old male with extensive history of HLD, CABG, CAD, DM 2, HTN, A-fib (on Coumadin), chronic anemia, epididymoorchitis .[name]... Presented with chief complaint of right testicular pain.  Patient reports that he has had some urinary transient and epididymoorchitis in August 2023 was seen and treated by urologist Dr. [name].  Apparently he has visited Dr. [name] in past week for urinary retention Foley catheter was placed on January 25th, currently was follow-up for catheter obstruction which cleared by flushing.  He was seen in office again 5 days ago for catheter removal, and post void residual on May 01, 2022. Urine culture from January 25 has been negative.... Patient reports that he has been able to drain his bladder, has some incontinency.  He is bearing briefs..  Noted he was passing blood clots last week which has resolved.  Today he is reporting of significant right testicular pain started last night.  Tylenol did not help.  This morning was quite worse was associate with nausea vomiting but no fever.   ED course: Blood pressure 132/61, pulse 76, temperature 98.4 F (36.9 C), resp. rate 20, SpO2 97 % On RA.  BMP within normal limits with exception of glucose of 201, calcium 8.8, magnesium 1.2, WBC 17.1, UA small hemoglobin, moderate leukocytosis, negative for any nitrites, >50 RBC, rare bacteria, WBC 11-20  Scrotal ultrasound: 4 mm peripheral  right scrotal cyst possibly along the rete testis. New heterogeneity of the right testis and mild right testicular hyperemia suggesting orchitis.   Requested patient to be admitted for IV antibiotics, pain management.     Patient Denies having: Fever, Chills, Cough, SOB, Chest Pain, Abd pain, N/V/D, headache, dizziness, lightheadedness,  Dysuria, Joint pain, rash, open wounds     Review of Systems: As per HPI, otherwise 10 point review of systems were negative.   ----------------------------------------------------------------------------------------------------------------------  Allergies  Allergen Reactions   Niacin Itching and Rash    Burning sensation   Contrast Media [Iodinated Contrast Media] Nausea And Vomiting   Iodine-131 Nausea And Vomiting   Nsaids Other (See Comments)    Taking Coumadin    Home MEDs:  Prior to Admission medications   Medication Sig Start Date End Date Taking? Authorizing Provider  acetaminophen (TYLENOL) 325 MG tablet Take 2 tablets (650 mg total) by mouth every 6 (six) hours as needed for mild pain, fever or headache (or Fever >/= 101). Patient taking differently: Take 325-650 mg by mouth every 6 (six) hours as needed for mild pain, fever or headache (or Fever >/= 101). 03/29/20   Roxan Hockey, MD  albuterol (VENTOLIN HFA) 108 (90 Base) MCG/ACT inhaler  Inhale 2 puffs into the lungs every 6 (six) hours as needed for wheezing or shortness of breath. 03/29/20   Roxan Hockey, MD  aspirin 81 MG EC tablet Take 1 tablet (81 mg total) by mouth daily with breakfast. 03/29/20   Denton Brick, Courage, MD  atorvastatin (LIPITOR) 40 MG tablet Take 1 tablet (40 mg total) by mouth daily. 11/30/13   Carlena Bjornstad, MD  doxycycline (VIBRAMYCIN) 100 MG capsule Take 1 capsule (100 mg total) by mouth every 12 (twelve) hours. 04/09/22   McKenzie, Candee Furbish, MD  dutasteride (AVODART) 0.5 MG capsule Take 1 capsule (0.5 mg total) by mouth daily. 02/02/22   McKenzie,  Candee Furbish, MD  famotidine (PEPCID) 20 MG tablet One after supper Patient taking differently: Take 20 mg by mouth daily. One after supper 06/07/20   Tanda Rockers, MD  HYDROcodone-acetaminophen [facility]) 10-325 MG tablet Take 1 tablet by mouth 2 (two) times daily as needed for moderate pain. 03/22/22   McKenzie, Candee Furbish, MD  ipratropium (ATROVENT) 0.03 % nasal spray Place 2 sprays into both nostrils as needed for rhinitis.    [provider]  isosorbide mononitrate (IMDUR) 60 MG 24 hr tablet Take 2 tablets (120 mg total) by mouth every evening. 04/11/20   Larey Dresser, MD  latanoprost (XALATAN) 0.005 % ophthalmic solution Place 1 drop into both eyes at bedtime.    [provider]  losartan (COZAAR) 25 MG tablet Take 1 tablet (25 mg total) by mouth daily. 03/29/20   Roxan Hockey, MD  metFORMIN (GLUCOPHAGE) 500 MG tablet Take 500 mg by mouth 2 (two) times daily. 12/18/21   [provider]  metoprolol succinate (TOPROL-XL) 25 MG 24 hr tablet Take 1 tablet (25 mg total) by mouth daily. Pt. Needs to make an appt. With Cardiologist in order to receive further refills. Thank You. 1st Attempt. Patient taking differently: Take 12.5 mg by mouth daily. 05/15/21   Dunn, Nedra Hai, PA-C  nitroGLYCERIN (NITROSTAT) 0.4 MG SL tablet Place 1 tablet (0.4 mg total) under the tongue every 5 (five) minutes x 3 doses as needed for chest pain. 12/14/19   Dunn, Nedra Hai, PA-C  OXYGEN Inhale 3 L into the lungs at bedtime.    [provider]  pantoprazole (PROTONIX) 40 MG tablet Take 1 tablet (40 mg total) by mouth daily. Take 30-60 min before first meal of the day 06/07/20   Tanda Rockers, MD  spironolactone (ALDACTONE) 25 MG tablet Take 1 tablet (25 mg total) by mouth daily. 03/16/22   [place], Maricela Bo, FNP  tamsulosin (FLOMAX) 0.4 MG CAPS capsule Take 1 capsule (0.4 mg total) by mouth daily after supper. 02/02/22   McKenzie, Candee Furbish, MD  torsemide (DEMADEX) 10 MG tablet Take 10-20 mg  by mouth See admin instructions. Take 10 mg daily, may increase to 20 mg as needed for weight gain    [provider]  Vitamin D, Ergocalciferol, (DRISDOL) 1.25 MG (50000 UNIT) CAPS capsule Take 1 capsule by mouth every Sunday. 12/27/21   [provider]  warfarin (COUMADIN) 2 MG tablet Take 2 mg by mouth daily. 12/19/21   [provider]    PRN MEDs: sodium chloride, acetaminophen **OR** acetaminophen, alum & mag hydroxide-simeth, bisacodyl, hydrALAZINE, HYDROcodone-acetaminophen, HYDROmorphone (DILAUDID) injection, ipratropium, levalbuterol, nitroGLYCERIN, ondansetron **OR** ondansetron (ZOFRAN) IV, oxyCODONE, senna-docusate, sodium chloride flush, sodium phosphate, traZODone  Past Medical History:  Diagnosis Date   AAA (abdominal aortic aneurysm) ([place])    Followed by Dr. Curt Jews  Arthritis    CAD (coronary artery disease)    a. CABG 2000.   Cancer [facility])    Prostate:  Radiation Tx   Carotid artery disease ([place])    Chest pain    precordial. mild chronic .[name]... nonischemic   CHF (congestive heart failure) (HCC)    Chronic edema    Coronary artery disease    a. Nuclear, January, 2008, no ischemia b. Cath 08/2012- 1/4 patent grafts, RCA CTO, no flow-limiting disease, medically managed   Diabetes mellitus without complication ([place])    Dizziness 02/2011   Dyslipidemia    Fall    GERD (gastroesophageal reflux disease)    TAKES TUMS & ROLAIDS AS NEEDED   Heart murmur    Hx of CABG 2000   Hypertension    Myocardial infarction [facility]) 1999   Neck pain 02/2011   Pneumonia    Pulmonary hypertension ([place])    Renal artery stenosis (HCC)    50-70%   S/P femoropopliteal bypass surgery    Dr. Donnetta Hutching   Sinus bradycardia    Asymptomatic    Past Surgical History:  Procedure Laterality Date   CARDIAC CATHETERIZATION  09/26/2012   1/4 patent bypass (occluded SVG-PDA, SVG-OM, LIMA-LAD), SVG-diagonal patent and fills the diagonal and LAD, distal RCA occlusion with left  to right collateralization, patent circumflex, LAD with no flow-limiting disease and antegrade flow competitively from SVG-diagonal; EF 60-65%   COLONOSCOPY  11/30/2009   [id number]. next TCS 11/2019   COLONOSCOPY WITH PROPOFOL N/A 12/18/2019   Procedure: COLONOSCOPY WITH PROPOFOL;  Surgeon: Harvel Quale, MD;  [facility];  Service: Gastroenterology;  Laterality: N/A;  1030   CORONARY ARTERY BYPASS GRAFT  2000   CYSTOSCOPY N/A 08/31/2021   Procedure: CYSTOSCOPY;  Surgeon: Cleon Gustin, MD;  Location: [facility] ORS;  Service: Urology;  Laterality: N/A;  pt knows to arrive at 7:00   [place] N/A 03/22/2022   Procedure: CYSTOSCOPY WITH FULGERATION;  Surgeon: Cleon Gustin, MD;  Location: [facility] ORS;  Service: Urology;  Laterality: N/A;   ESOPHAGOGASTRODUODENOSCOPY N/A 08/12/2013   [id number] peptic stricture with erosive refluxesophagitis - status post Maloney dilation. Hiatal hernia. Abnormalgastric mucosa. Deformity of the pyloric channel suggestive ofprior peptic ulcer disease. Duodenal bulbar diverticulum Statuspost gastric biopsy. h.pylori   LEFT HEART CATHETERIZATION WITH CORONARY/GRAFT ANGIOGRAM N/A 09/26/2012   Procedure: LEFT HEART CATHETERIZATION WITH Beatrix Fetters;  Surgeon: Burnell Blanks, MD;  Location: [facility] CATH LAB;  Service: Cardiovascular;  Laterality: N/A;   MALONEY DILATION N/A 08/12/2013   Procedure: Venia Minks DILATION;  Surgeon: Daneil Dolin, MD;  Location[facility];  Service: Endoscopy;  Laterality: N/A;   POLYPECTOMY  12/18/2019   Procedure: POLYPECTOMY;  Surgeon: Harvel Quale, MD;  Location[facility];  Service: Gastroenterology;;  ascending colon polyp    PR VEIN BYPASS GRAFT,AORTO-FEM-POP Right 07/19/1999   PR VEIN BYPASS GRAFT,AORTO-FEM-POP Left 05/02/2006   RIGHT HEART CATH AND CORONARY/GRAFT ANGIOGRAPHY N/A 04/11/2020   Procedure: RIGHT HEART CATH AND CORONARY/GRAFT ANGIOGRAPHY;   Surgeon: Larey Dresser, MD;  Location: [place] CV LAB;  Service: Cardiovascular;  Laterality: N/A;   SAVORY DILATION N/A 08/12/2013   Procedure: SAVORY DILATION;  Surgeon: Daneil Dolin, MD;  [facility];  Service: Endoscopy;  Laterality: N/A;   TRANSURETHRAL RESECTION OF BLADDER TUMOR N/A 08/31/2021   Procedure: TRANSURETHRAL RESECTION OF BLADDER TUMOR (TURBT);  Surgeon: Cleon Gustin, MD;  Location: [facility] OR[facility];  Service: Urology;  Laterality: N/A;  WRIST SURGERY     cyst removal     reports that he quit smoking about 4 years ago. His smoking use included cigarettes. He has a 140.00 pack-year smoking history. He has never used smokeless tobacco. He reports current alcohol use. He reports that he does not use drugs.   Family History  Problem Relation Age of Onset   Deep vein thrombosis Father    Lung cancer Sister    Diabetes Sister    Heart disease Sister        After age 53   Hyperlipidemia Sister    Hypertension Sister    Lung cancer Sister    Breast cancer Sister    Hypertension Mother    Diabetes Sister    Coronary artery disease Other        family hx of   Cancer Brother        "crab cancer"   Heart disease Brother    Heart attack Brother    Heart attack Daughter    Colon cancer Neg Hx    Stroke Neg Hx     Physical Exam:   Vitals:   05/06/22 0938 05/06/22 0940 05/06/22 1230 05/06/22 1340  BP:  (!) 158/56 132/61   Pulse:  89 76   Resp:  20 20   Temp:  98.4 F (36.9 C)  98.4 F (36.9 C)  TempSrc:  Oral    SpO2:  95% 97%   Weight: 91.6 kg     Height: '5\' 10"'$  (1.778 m)      Constitutional: NAD, calm, comfortable Eyes: PERRL, lids and conjunctivae normal ENMT: Mucous membranes are moist. Posterior pharynx clear of any exudate or lesions.Normal dentition.  Neck: normal, supple, no masses, no thyromegaly Respiratory: clear to auscultation bilaterally, no wheezing, no crackles. Normal respiratory effort. No accessory muscle use.   Cardiovascular: Regular rate and rhythm, no murmurs / rubs / gallops. No extremity edema. 2+ pedal pulses. No carotid bruits.  Abdomen: no tenderness, no masses palpated. No hepatosplenomegaly. Bowel sounds positive.  Musculoskeletal: no clubbing / cyanosis. No joint deformity upper and lower extremities. Good ROM, no contractures. Normal muscle tone.  Right testicular tenderness mild edema no erythema Neurologic: CN II-XII grossly intact. Sensation intact, DTR normal. Strength 5/5 in all 4.  Psychiatric: Normal judgment and insight. Alert and oriented x 3. Normal mood.  Skin: no rashes, lesions, ulcers. No induration  Wounds: per nursing documentation         Labs on admission:    I have personally reviewed following labs and imaging studies  CBC: Recent Labs  Lab 05/06/22 1047  WBC 17.1*  NEUTROABS 14.9*  HGB 11.7*  HCT 35.7*  MCV 86.2  PLT 628   Basic Metabolic Panel: Recent Labs  Lab 05/06/22 1047  NA 137  K 3.8  CL 101  CO2 25  GLUCOSE 201*  BUN 22  CREATININE 1.08  CALCIUM 8.8*  MG 1.2*   GFR: Estimated Creatinine Clearance: 63.1 mL/min (by C-G formula based on SCr of 1.08 mg/dL). Liver Function Tests: Recent Labs  Lab 05/06/22 1047  AST 29  ALT 11  ALKPHOS 75  BILITOT 0.8  PROT 7.2  ALBUMIN 3.6   No results for input(s): "LIPASE", "AMYLASE" in the last 168 hours. No results for input(s): "AMMONIA" in the last 168 hours. Coagulation Profile: Recent Labs  Lab 05/06/22 1047  INR 3.0*   Cardiac Enzymes: No results for input(s): "CKTOTAL", "CKMB", "CKMBINDEX", "TROPONINI" in the last 168 hours. BNP (last  3 results) No results for input(s): "PROBNP" in the last 8760 hours. HbA1C: No results for input(s): "HGBA1C" in the last 72 hours. CBG: Recent Labs  Lab 05/06/22 1325  GLUCAP 150*   Lipid Profile: No results for input(s): "CHOL", "HDL", "LDLCALC", "TRIG", "CHOLHDL", "LDLDIRECT" in the last 72 hours. Thyroid Function Tests: No  results for input(s): "TSH", "T4TOTAL", "FREET4", "T3FREE", "THYROIDAB" in the last 72 hours. Anemia Panel: No results for input(s): "VITAMINB12", "FOLATE", "FERRITIN", "TIBC", "IRON", "RETICCTPCT" in the last 72 hours. Urine analysis:    Component Value Date/Time   COLORURINE YELLOW 05/06/2022 1153   APPEARANCEUR CLEAR 05/06/2022 1153   APPEARANCEUR Cloudy (A) 04/26/2022 1634   LABSPEC 1.013 05/06/2022 1153   PHURINE 9.0 (H) 05/06/2022 1153   GLUCOSEU NEGATIVE 05/06/2022 1153   HGBUR SMALL (A) 05/06/2022 1153   BILIRUBINUR NEGATIVE 05/06/2022 1153   BILIRUBINUR Negative 04/26/2022 1634   KETONESUR NEGATIVE 05/06/2022 1153   PROTEINUR 30 (A) 05/06/2022 1153   NITRITE NEGATIVE 05/06/2022 1153   LEUKOCYTESUR MODERATE (A) 05/06/2022 1153    Last A1C:  Lab Results  Component Value Date   HGBA1C 6.9 (H) 01/02/2022     Radiologic Exams on Admission:   US SCROTUM W/DOPPLER  Result Date: 05/06/2022 CLINICAL DATA:  Sudden onset right testicular pain and swelling EXAM: SCROTAL ULTRASOUND DOPPLER ULTRASOUND OF THE TESTICLES TECHNIQUE: Complete ultrasound examination of the testicles, epididymis, and other scrotal structures was performed. Color and spectral Doppler ultrasound were also utilized to evaluate blood flow to the testicles. COMPARISON:  Scrotal ultrasound 01/03/2022 FINDINGS: Right testicle Measurements: 3.2 by 4.6 by 3.1 cm. No mass or microlithiasis visualized. 4 mm peripheral right scrotal cyst possibly along the rete testis. New heterogeneity of the right testis and mild right testicular hyperemia suggesting orchitis. Left testicle Measurements: 4.6 by 2.2 by 3.3 cm. Unchanged 6 mm simple appearing cyst. Ectasia of rete testis unchanged. Right epididymis:  Normal in size and appearance. Left epididymis:  0.7 cm epididymal cyst or spermatocele unchanged. Hydrocele:  None visualized. Varicocele:  None visualized. Pulsed Doppler interrogation of both testes demonstrates normal low  resistance arterial and venous waveforms bilaterally. IMPRESSION: 1. New heterogeneity of the right testis and mild right testicular hyperemia suggesting orchitis. 2. No evidence of testicular torsion. Electronically Signed   By: Van Clines M.D.   On: 05/06/2022 13:10    EKG:   Independently reviewed.  Orders placed or performed during the hospital encounter of 05/06/22   EKG 12-Lead   EKG 12-Lead   EKG 12-Lead   ---------------------------------------------------------------------------------------------------------------------------------------    Assessment / Plan:   Principal Problem:   Orchitis and epididymitis Active Problems:   Acute urinary obstruction   Type 2 diabetes mellitus (HCC)   Atrial fibrillation (HCC)   Chronic heart failure with preserved ejection fraction (HFpEF) (HCC)   Hyperlipidemia   CORONARY ARTERY BYPASS GRAFT, HX OF   --Severe pulmonary HTN, 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 ----Pulmonary HTN    Assessment and Plan: * Orchitis and epididymitis -Admitting the patient for pain control, and IV antibiotics -Gentle IV fluid hydration  Testicular ultrasound: MPRESSION: 1. New heterogeneity of the right testis and mild right testicular hyperemia suggesting orchitis. 2. No evidence of testicular torsion.  -Follow-up with urine cultures -Consulted urologist Dr. [name] for further evaluation recommendations -As needed IV analgesics  Acute urinary obstruction -History of urinary retention, -Monitoring closely, in and out as needed, if continues to progress anticipate temporary Foley catheter placement  Chronic heart failure with preserved ejection fraction (HFpEF) ([place]) -  Will continue home medications including losartan,  Aldactone -Demadex on hold will restart in a.m. -Currently patient needs gentle IV hydration for leukocytosis, orchitis infection  Atrial fibrillation (HCC) -Stable continuing rate control medications including Toprol-XL, and  Coumadin -INR therapeutic  Type 2 diabetes mellitus (HCC) -Currently on metformin, will hold for now -Checking CBG before every meal/with SSI coverage -Last A1c October 2023 6.9 -Rechecking A1c  --Severe pulmonary HTN, 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 ----Pulmonary HTN  -History of pulmonary hypertension -Gentle IV fluid hydration -Monitoring closely  CORONARY ARTERY BYPASS GRAFT, HX OF - Continue current medication including: Imdur, statins, losartan, Toprol, Coumadin  Hyperlipidemia -Continue statins              Consults called: Urologist Dr. [name] -------------------------------------------------------------------------------------------------------------------------------------------- DVT prophylaxis:  TED hose Start: 05/06/22 1446 SCDs Start: 05/06/22 1446 warfarin (COUMADIN) tablet 2 mg   Code Status:   Code Status: Full Code   Admission status: Patient will be admitted as Inpatient, with a greater than 2 midnight length of stay. Level of care: Med-Surg   Family Communication:  none at bedside  (The above findings and plan of care has been discussed with patient in detail, the patient expressed understanding and agreement of above plan)  -------------------------------------------------------------------------------------------------------------------------  Disposition Plan:  Anticipated 1-2 days Status is: Inpatient Remains inpatient appropriate because: Needing IV fluid, IV antibiotics, pain management -------------------------------------------------------------------------------------------------------------------------  Time spent: > than  58  Min.  Was spent seeing and evaluating the patient, reviewing all medical records, drawn plan of care.  SIGNED: Deatra James, MD, FHM. FAAFP. [place] [facility], Pager  (Please use [email or web address] to page/ or secure chat through epic) If 7PM-7AM, please contact night-coverage [email or web address],  05/06/2022,  3:14 PM

## 2022-05-06 NOTE — ED Provider Notes (Signed)
[facility] Provider Note   CSN: [id number] Arrival date & time: 05/06/22  0932     History  Chief Complaint  Patient presents with   Testicle Pain    Troy [name] is a 82 y.o. male.   Testicle Pain  Patient presents for right testicle pain.  Medical history includes HLD, CAD, T2DM, HTN, atrial fibrillation, anemia.  In August of last year, he had left testicle pain and was diagnosed with epididymoorchitis.  He has had other episodes of left testicular pain but this is the first episode on the right.  He has an extensive urologic history.  He is followed by Dr. [name] with urology.  He has had multiple office visits over the past week.  A Foley catheter was placed on 1/25 due to urine retention.  He was seen the following day for catheter obstruction which was cleared with flushing.  He was again seen 5 days ago for catheter removal postvoid residual on that day was 30.  Urine culture from 1/25 was negative.  He states that he has been able to drain his bladder.  He has had recent urine incontinence.  He has been wearing a brief period earlier last week he was passing blood clots but these have resolved.  Onset of right testicle pain was last night.  He took some Tylenol which did improve his pain.  When he woke up this morning, pain was severe.  He had some nausea and vomiting.  He continues to endorse severe pain and nausea.     Home Medications Prior to Admission medications   Medication Sig Start Date End Date Taking? Authorizing Provider  warfarin (COUMADIN) 2 MG tablet Take 2 mg by mouth daily. 12/19/21  Yes [provider]  acetaminophen (TYLENOL) 325 MG tablet Take 2 tablets (650 mg total) by mouth every 6 (six) hours as needed for mild pain, fever or headache (or Fever >/= 101). Patient taking differently: Take 325-650 mg by mouth every 6 (six) hours as needed for mild pain, fever or headache (or Fever >/= 101). 03/29/20    Roxan Hockey, MD  albuterol (VENTOLIN HFA) 108 (90 Base) MCG/ACT inhaler Inhale 2 puffs into the lungs every 6 (six) hours as needed for wheezing or shortness of breath. 03/29/20   Roxan Hockey, MD  aspirin 81 MG EC tablet Take 1 tablet (81 mg total) by mouth daily with breakfast. 03/29/20   Denton Brick, Courage, MD  atorvastatin (LIPITOR) 40 MG tablet Take 1 tablet (40 mg total) by mouth daily. 11/30/13   Carlena Bjornstad, MD  doxycycline (VIBRAMYCIN) 100 MG capsule Take 1 capsule (100 mg total) by mouth every 12 (twelve) hours. Patient not taking: Reported on 05/06/2022 04/09/22   Cleon Gustin, MD  dutasteride (AVODART) 0.5 MG capsule Take 1 capsule (0.5 mg total) by mouth daily. 02/02/22   McKenzie, Candee Furbish, MD  famotidine (PEPCID) 20 MG tablet One after supper Patient taking differently: Take 20 mg by mouth daily. One after supper 06/07/20   Tanda Rockers, MD  HYDROcodone-acetaminophen [facility]) 10-325 MG tablet Take 1 tablet by mouth 2 (two) times daily as needed for moderate pain. 03/22/22   McKenzie, Candee Furbish, MD  ipratropium (ATROVENT) 0.03 % nasal spray Place 2 sprays into both nostrils as needed for rhinitis.    [provider]  isosorbide mononitrate (IMDUR) 60 MG 24 hr tablet Take 2 tablets (120 mg total) by mouth every evening. 04/11/20   Aundra Dubin,  Elby Showers, MD  latanoprost (XALATAN) 0.005 % ophthalmic solution Place 1 drop into both eyes at bedtime.    [provider]  losartan (COZAAR) 25 MG tablet Take 1 tablet (25 mg total) by mouth daily. 03/29/20   Roxan Hockey, MD  metFORMIN (GLUCOPHAGE) 500 MG tablet Take 500 mg by mouth 2 (two) times daily. 12/18/21   [provider]  metoprolol succinate (TOPROL-XL) 25 MG 24 hr tablet Take 1 tablet (25 mg total) by mouth daily. Pt. Needs to make an appt. With Cardiologist in order to receive further refills. Thank You. 1st Attempt. Patient taking differently: Take 12.5 mg by mouth daily. 05/15/21   Dunn, Nedra Hai,  PA-C  nitroGLYCERIN (NITROSTAT) 0.4 MG SL tablet Place 1 tablet (0.4 mg total) under the tongue every 5 (five) minutes x 3 doses as needed for chest pain. 12/14/19   Dunn, Nedra Hai, PA-C  OXYGEN Inhale 3 L into the lungs at bedtime.    [provider]  pantoprazole (PROTONIX) 40 MG tablet Take 1 tablet (40 mg total) by mouth daily. Take 30-60 min before first meal of the day 06/07/20   Tanda Rockers, MD  spironolactone (ALDACTONE) 25 MG tablet Take 1 tablet (25 mg total) by mouth daily. 03/16/22   Milford, Maricela Bo, FNP  tamsulosin (FLOMAX) 0.4 MG CAPS capsule Take 1 capsule (0.4 mg total) by mouth daily after supper. 02/02/22   McKenzie, Candee Furbish, MD  torsemide (DEMADEX) 10 MG tablet Take 10-20 mg by mouth See admin instructions. Take 10 mg daily, may increase to 20 mg as needed for weight gain    [provider]  Vitamin D, Ergocalciferol, (DRISDOL) 1.25 MG (50000 UNIT) CAPS capsule Take 1 capsule by mouth every Sunday. 12/27/21   [provider]      Allergies    Niacin, Contrast media [iodinated contrast media], Iodine-131, and Nsaids    Review of Systems   Review of Systems  Gastrointestinal:  Positive for nausea and vomiting.  Genitourinary:  Positive for testicular pain.  All other systems reviewed and are negative.   Physical Exam Updated Vital Signs BP (!) 146/63 (BP Location: Left Arm)   Pulse 69   Temp 98.8 F (37.1 C) (Oral)   Resp 20   Ht '5\' 10"'$  (1.778 m)   Wt 91.6 kg   SpO2 98%   BMI 28.98 kg/m  Physical Exam Vitals and nursing note reviewed. Exam conducted with a chaperone present.  Constitutional:      General: He is not in acute distress.    Appearance: Normal appearance. He is well-developed. He is not ill-appearing, toxic-appearing or diaphoretic.  HENT:     Head: Normocephalic and atraumatic.     Right Ear: External ear normal.     Left Ear: External ear normal.     Nose: Nose normal.     Mouth/Throat:     Mouth: Mucous membranes  are moist.  Eyes:     Extraocular Movements: Extraocular movements intact.     Conjunctiva/sclera: Conjunctivae normal.  Cardiovascular:     Rate and Rhythm: Normal rate and regular rhythm.  Pulmonary:     Effort: Pulmonary effort is normal. No respiratory distress.  Abdominal:     General: There is no distension.     Palpations: Abdomen is soft.     Tenderness: There is no abdominal tenderness.  Genitourinary:    Penis: Uncircumcised.      Testes: Cremasteric reflex is present.  Right: Tenderness (Significant) present. Swelling not present.        Left: Tenderness (Mild) present. Swelling not present.  Musculoskeletal:        General: No swelling. Normal range of motion.     Cervical back: Normal range of motion and neck supple.     Right lower leg: No edema.     Left lower leg: No edema.  Skin:    General: Skin is warm and dry.     Coloration: Skin is not jaundiced or pale.  Neurological:     General: No focal deficit present.     Mental Status: He is alert and oriented to person, place, and time.     Cranial Nerves: No cranial nerve deficit.     Sensory: No sensory deficit.     Motor: No weakness.     Coordination: Coordination normal.  Psychiatric:        Mood and Affect: Mood normal.        Behavior: Behavior normal.        Thought Content: Thought content normal.        Judgment: Judgment normal.     ED Results / Procedures / Treatments   Labs (all labs ordered are listed, but only abnormal results are displayed) Labs Reviewed  PROTIME-INR - Abnormal; Notable for the following components:      Result Value   Prothrombin Time 31.1 (*)    INR 3.0 (*)    All other components within normal limits  URINALYSIS, ROUTINE W REFLEX MICROSCOPIC - Abnormal; Notable for the following components:   pH 9.0 (*)    Hgb urine dipstick SMALL (*)    Protein, ur 30 (*)    Leukocytes,Ua MODERATE (*)    Bacteria, UA RARE (*)    All other components within normal limits   CBC WITH DIFFERENTIAL/PLATELET - Abnormal; Notable for the following components:   WBC 17.1 (*)    RBC 4.14 (*)    Hemoglobin 11.7 (*)    HCT 35.7 (*)    RDW 15.9 (*)    Neutro Abs 14.9 (*)    Lymphs Abs 0.6 (*)    Monocytes Absolute 1.3 (*)    Abs Immature Granulocytes 0.20 (*)    All other components within normal limits  COMPREHENSIVE METABOLIC PANEL - Abnormal; Notable for the following components:   Glucose, Bld 201 (*)    Calcium 8.8 (*)    All other components within normal limits  MAGNESIUM - Abnormal; Notable for the following components:   Magnesium 1.2 (*)    All other components within normal limits  PHOSPHORUS - Abnormal; Notable for the following components:   Phosphorus 1.6 (*)    All other components within normal limits  GLUCOSE, CAPILLARY - Abnormal; Notable for the following components:   Glucose-Capillary 179 (*)    All other components within normal limits  CBG MONITORING, ED - Abnormal; Notable for the following components:   Glucose-Capillary 150 (*)    All other components within normal limits  CULTURE, BLOOD (ROUTINE X 2)  CULTURE, BLOOD (ROUTINE X 2)  URINE CULTURE  EXPECTORATED SPUTUM ASSESSMENT W GRAM STAIN, RFLX TO RESP C  HEMOGLOBIN E3X  BASIC METABOLIC PANEL  CBC  PROTIME-INR  APTT    EKG EKG Interpretation  Date/Time:  Sunday May 06 2022 14:56:22 EST Ventricular Rate:  73 PR Interval:    QRS Duration: 135 QT Interval:  411 QTC Calculation: 453 R Axis:   83 Text Interpretation: Atrial  fibrillation Right bundle branch block Nonspecific T abnormalities, lateral leads Confirmed by Godfrey Pick [phone]) on 05/06/2022 5:41:05 PM  Radiology US SCROTUM W/DOPPLER  Result Date: 05/06/2022 CLINICAL DATA:  Sudden onset right testicular pain and swelling EXAM: SCROTAL ULTRASOUND DOPPLER ULTRASOUND OF THE TESTICLES TECHNIQUE: Complete ultrasound examination of the testicles, epididymis, and other scrotal structures was performed. Color and  spectral Doppler ultrasound were also utilized to evaluate blood flow to the testicles. COMPARISON:  Scrotal ultrasound 01/03/2022 FINDINGS: Right testicle Measurements: 3.2 by 4.6 by 3.1 cm. No mass or microlithiasis visualized. 4 mm peripheral right scrotal cyst possibly along the rete testis. New heterogeneity of the right testis and mild right testicular hyperemia suggesting orchitis. Left testicle Measurements: 4.6 by 2.2 by 3.3 cm. Unchanged 6 mm simple appearing cyst. Ectasia of rete testis unchanged. Right epididymis:  Normal in size and appearance. Left epididymis:  0.7 cm epididymal cyst or spermatocele unchanged. Hydrocele:  None visualized. Varicocele:  None visualized. Pulsed Doppler interrogation of both testes demonstrates normal low resistance arterial and venous waveforms bilaterally. IMPRESSION: 1. New heterogeneity of the right testis and mild right testicular hyperemia suggesting orchitis. 2. No evidence of testicular torsion. Electronically Signed   By: Van Clines M.D.   On: 05/06/2022 13:10    Procedures Procedures    Medications Ordered in ED Medications  sodium chloride flush (NS) 0.9 % injection 3 mL (has no administration in time range)  0.9 %  sodium chloride infusion (has no administration in time range)  sodium chloride flush (NS) 0.9 % injection 3 mL (has no administration in time range)  sodium chloride flush (NS) 0.9 % injection 3 mL (has no administration in time range)  0.9 %  sodium chloride infusion (has no administration in time range)  acetaminophen (TYLENOL) tablet 650 mg (has no administration in time range)    Or  acetaminophen (TYLENOL) suppository 650 mg (has no administration in time range)  traZODone (DESYREL) tablet 25 mg (has no administration in time range)  senna-docusate (Senokot-S) tablet 1 tablet (has no administration in time range)  bisacodyl (DULCOLAX) EC tablet 5 mg (has no administration in time range)  sodium phosphate (FLEET) 7-19  GM/118ML enema 1 enema (has no administration in time range)  ondansetron (ZOFRAN) tablet 4 mg (has no administration in time range)    Or  ondansetron (ZOFRAN) injection 4 mg (has no administration in time range)  ipratropium (ATROVENT) nebulizer solution 0.5 mg (has no administration in time range)  levalbuterol (XOPENEX) nebulizer solution 0.63 mg (has no administration in time range)  hydrALAZINE (APRESOLINE) injection 10 mg (has no administration in time range)  HYDROcodone-acetaminophen (NORCO) 10-325 MG per tablet 1 tablet (has no administration in time range)  atorvastatin (LIPITOR) tablet 40 mg (has no administration in time range)  isosorbide mononitrate (IMDUR) 24 hr tablet 120 mg (has no administration in time range)  losartan (COZAAR) tablet 25 mg (has no administration in time range)  metoprolol succinate (TOPROL-XL) 24 hr tablet 12.5 mg (has no administration in time range)  nitroGLYCERIN (NITROSTAT) SL tablet 0.4 mg (has no administration in time range)  dutasteride (AVODART) capsule 0.5 mg (has no administration in time range)  tamsulosin (FLOMAX) capsule 0.4 mg (has no administration in time range)  warfarin (COUMADIN) tablet 2 mg (has no administration in time range)  alum & mag hydroxide-simeth (MAALOX/MYLANTA) 200-200-20 MG/5ML suspension 30 mL (has no administration in time range)  acidophilus (RISAQUAD) capsule 2 capsule (has no administration in time range)  insulin aspart (  novoLOG) injection 0-9 Units (has no administration in time range)  HYDROmorphone (DILAUDID) injection 1 mg (has no administration in time range)  torsemide (DEMADEX) tablet 20 mg (has no administration in time range)  latanoprost (XALATAN) 0.005 % ophthalmic solution 1 drop (has no administration in time range)  Warfarin - Pharmacist Dosing Inpatient (has no administration in time range)  Ampicillin-Sulbactam (UNASYN) 3 g in sodium chloride 0.9 % 100 mL IVPB (has no administration in time range)   fentaNYL (SUBLIMAZE) injection 100 mcg (100 mcg Intravenous Given 05/06/22 1043)  ondansetron (ZOFRAN) injection 4 mg (4 mg Intravenous Given 05/06/22 1043)  lactated ringers bolus 500 mL (0 mLs Intravenous Stopped 05/06/22 1153)  magnesium sulfate IVPB 2 g 50 mL (0 g Intravenous Stopped 05/06/22 1333)  levofloxacin (LEVAQUIN) IVPB 500 mg (0 mg Intravenous Stopped 05/06/22 1435)  fentaNYL (SUBLIMAZE) injection 100 mcg (100 mcg Intravenous Given 05/06/22 1435)    ED Course/ Medical Decision Making/ A&P                             Medical Decision Making Amount and/or Complexity of Data Reviewed Labs: ordered. Radiology: ordered.  Risk Prescription drug management. Decision regarding hospitalization.   This patient presents to the ED for concern of right testicle pain, this involves an extensive number of treatment options, and is a complaint that carries with it a high risk of complications and morbidity.  The differential diagnosis includes orchitis, epididymitis, testicular torsion   Co morbidities that complicate the patient evaluation  HLD, CAD, T2DM, HTN, atrial fibrillation, anemia   Additional history obtained:  Additional history obtained from patient's daughter External records from outside source obtained and reviewed including EMR   Lab Tests:  I Ordered, and personally interpreted labs.  The pertinent results include: Leukocytosis is present.  Anemia is baseline.  Hypomagnesemia is present with otherwise normal electrolytes.  Urinalysis shows pyuria and hematuria with rare bacteria present.   Imaging Studies ordered:  I ordered imaging studies including scrotal ultrasound I independently visualized and interpreted imaging which showed right-sided orchitis I agree with the radiologist interpretation   Cardiac Monitoring: / EKG:  The patient was maintained on a cardiac monitor.  I personally viewed and interpreted the cardiac monitored which showed an underlying  rhythm of: Atrial fibrillation  Problem List / ED Course / Critical interventions / Medication management  Patient presents for right testicle pain.  Onset was last night.  Pain became severe this morning.  On arrival in the ED, vital signs are notable for moderate hypertension with widened pulse pressure.  GU exam was performed with paramedic chaperone present.  I do not appreciate any swelling to his right testicle.  It is severely tender to palpation.  He endorses some mild tenderness on the left.  Cremasteric reflexes intact bilaterally.  Fentanyl and Zofran ordered for symptomatic relief.  Given his vomiting and poor p.o. intake today, IV fluids were ordered.  Patient to undergo lab work and testicular ultrasound.  Lab work is notable for leukocytosis and hypomagnesemia.  Replacement magnesium was ordered.  Ultrasound findings are consistent with right-sided orchitis.  Patient was started on Levaquin.  On reassessment, he does have resolution of nausea but continues to have severe testicle pain with any movements.  Additional fentanyl was ordered.  Patient states that he lives alone and would be unable to ambulate with his current pain.  Patient was admitted for observation and pain control. Medications ordered include  fentanyl for analgesia; Zofran for nausea; IVF for hydration; Levaquin for orchitis; magnesium sulfate for hypomagnesemia Reevaluation of the patient after these medicines showed that the patient improved I have reviewed the patients home medicines and have made adjustments as needed   Social Determinants of Health:  Lives independently         Final Clinical Impression(s) / ED Diagnoses Final diagnoses:  Orchitis    Rx / DC Orders ED Discharge Orders     None         Godfrey Pick, MD 05/06/22 1743

## 2022-05-06 NOTE — Assessment & Plan Note (Signed)
-  stable  -History of pulmonary hypertension -Monitoring closely

## 2022-05-06 NOTE — Progress Notes (Signed)
Patient refused for staff to do orthostatic vitals due to patient increased pain while getting out of bed. MD Shahmehdi made aware.

## 2022-05-06 NOTE — Assessment & Plan Note (Signed)
-  Stable continuing rate control medications including Toprol-XL, and Coumadin -INR therapeutic

## 2022-05-06 NOTE — Assessment & Plan Note (Signed)
-  Currently on metformin, will hold for now -Checking CBG before every meal/with SSI coverage -Last A1c October 2023 6.9 -Rechecking A1c

## 2022-05-06 NOTE — Assessment & Plan Note (Signed)
-   Continue current medication including: Imdur, statins, losartan, Toprol, Coumadin

## 2022-05-07 DIAGNOSIS — R5381 Other malaise: Secondary | ICD-10-CM | POA: Diagnosis present

## 2022-05-07 DIAGNOSIS — N3941 Urge incontinence: Secondary | ICD-10-CM

## 2022-05-07 DIAGNOSIS — N453 Epididymo-orchitis: Secondary | ICD-10-CM

## 2022-05-07 LAB — GLUCOSE, CAPILLARY
Glucose-Capillary: 149 mg/dL — ABNORMAL HIGH (ref 70–99)
Glucose-Capillary: 132 mg/dL — ABNORMAL HIGH (ref 70–99)
Glucose-Capillary: 132 mg/dL — ABNORMAL HIGH (ref 70–99)
Glucose-Capillary: 113 mg/dL — ABNORMAL HIGH (ref 70–99)

## 2022-05-07 LAB — CBC
HCT: 31.6 % — ABNORMAL LOW (ref 39.0–52.0)
Hemoglobin: 9.6 g/dL — ABNORMAL LOW (ref 13.0–17.0)
MCH: 27 pg (ref 26.0–34.0)
MCHC: 30.4 g/dL (ref 30.0–36.0)
MCV: 88.8 fL (ref 80.0–100.0)
Platelets: 179 10*3/uL (ref 150–400)
RBC: 3.56 MIL/uL — ABNORMAL LOW (ref 4.22–5.81)
RDW: 15.9 % — ABNORMAL HIGH (ref 11.5–15.5)
WBC: 25.9 10*3/uL — ABNORMAL HIGH (ref 4.0–10.5)
nRBC: 0 % (ref 0.0–0.2)

## 2022-05-07 LAB — BASIC METABOLIC PANEL
Anion gap: 9 (ref 5–15)
BUN: 19 mg/dL (ref 8–23)
CO2: 24 mmol/L (ref 22–32)
Calcium: 7.7 mg/dL — ABNORMAL LOW (ref 8.9–10.3)
Chloride: 101 mmol/L (ref 98–111)
Creatinine, Ser: 0.99 mg/dL (ref 0.61–1.24)
GFR, Estimated: >60 mL/min (ref >60)
Glucose, Bld: 144 mg/dL — ABNORMAL HIGH (ref 70–99)
Potassium: 3.6 mmol/L (ref 3.5–5.1)
Sodium: 134 mmol/L — ABNORMAL LOW (ref 135–145)

## 2022-05-07 LAB — APTT: aPTT: 45 seconds — ABNORMAL HIGH (ref 24–36)

## 2022-05-07 LAB — PROTIME-INR
INR: 2.9 — ABNORMAL HIGH (ref 0.8–1.2)
Prothrombin Time: 30 seconds — ABNORMAL HIGH (ref 11.4–15.2)

## 2022-05-07 MED ORDER — IBUPROFEN 600 MG PO TABS
600.0000 mg | ORAL_TABLET | Freq: Three times a day (TID) | ORAL | Status: DC
  Administered 2022-05-07 – 2022-05-11 (×13): 600 mg via ORAL
  Filled 2022-05-07 (×13): qty 1

## 2022-05-07 MED ORDER — PANTOPRAZOLE SODIUM 40 MG PO TBEC
40.0000 mg | DELAYED_RELEASE_TABLET | Freq: Every day | ORAL | Status: DC
  Administered 2022-05-07 – 2022-05-11 (×5): 40 mg via ORAL
  Filled 2022-05-07 (×5): qty 1

## 2022-05-07 NOTE — Plan of Care (Signed)
  Problem: Acute Rehab PT Goals(only PT should resolve) Goal: Pt Will Go Supine/Side To Sit Flowsheets (Taken 05/07/2022 1414) Pt will go Supine/Side to Sit: with minimal assist Goal: Patient Will Transfer Sit To/From Stand Flowsheets (Taken 05/07/2022 1414) Patient will transfer sit to/from stand: with minimal assist Goal: Pt Will Transfer Bed To Chair/Chair To Bed Flowsheets (Taken 05/07/2022 1414) Pt will Transfer Bed to Chair/Chair to Bed: with min assist Goal: Pt Will Ambulate Flowsheets (Taken 05/07/2022 1414) Pt will Ambulate:  25 feet  with minimal assist  with moderate assist  with rolling walker   2:14 PM, 05/07/22 Lonell Grandchild, MPT Physical Therapist with [facility] [phone] office [phone] mobile phone

## 2022-05-07 NOTE — NC FL2 (Signed)
[place] LEVEL OF CARE FORM     IDENTIFICATION  Patient Name: Troy [name] Birthdate: 11/23/1940 Sex: male Admission Date (Current Location): 05/06/2022  [facility] and [place] Number:  [employer] and Address:  [facility] [address] [place]      Provider Number: [id number]  Attending Physician Name and Address:  Deatra James, MD  Relative Name and Phone Number:       Current Level of Care: Hospital Recommended Level of Care: [place] Prior Approval Number:    Date Approved/Denied:   PASRR Number: [id number]  Discharge Plan: SNF    Current Diagnoses: Patient Active Problem List   Diagnosis Date Noted   Orchitis and epididymitis 05/06/2022   Hematuria 01/03/2022   Gross hematuria 01/02/2022   Acute urinary obstruction 01/02/2022   Epididymoorchitis 11/28/2021   Chronic heart failure with preserved ejection fraction (HFpEF) ([place]) 11/28/2021   Acquired thrombophilia ([place]) 11/06/2021   Upper airway cough syndrome 02/04/2021   Nocturnal hypoxemia 06/27/2020   DOE (dyspnea on exertion) 06/07/2020   Rt Sided Community acquired pneumonia 03/26/2020   Microcytic anemia 03/26/2020   --Severe pulmonary HTN, 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[place]) 08/07/2011   Neck pain    Dizziness    Carotid artery disease (HCC)    Renal artery stenosis (HCC)    S/P femoropopliteal  bypass surgery    Ejection fraction    Sinus bradycardia    Hyperlipidemia 01/04/2010   EDEMA 07/04/2008   CORONARY ARTERY BYPASS GRAFT, HX OF 07/04/2008    Orientation RESPIRATION BLADDER Height & Weight     Self, Time, Situation, Place  Normal Incontinent Weight: 209 lb 10.5 oz (95.1 kg) Height:  '5\' 10"'$  (177.8 cm)  BEHAVIORAL SYMPTOMS/MOOD NEUROLOGICAL BOWEL NUTRITION STATUS      Continent Diet (see dc summary)  AMBULATORY STATUS COMMUNICATION OF NEEDS Skin   Extensive Assist Verbally Normal                       Personal Care Assistance Level of Assistance  Bathing, Feeding, Dressing Bathing Assistance: Limited assistance Feeding assistance: Independent Dressing Assistance: Limited assistance     Functional Limitations Info  Sight, Hearing, Speech Sight Info: Adequate Hearing Info: Impaired Speech Info: Adequate    SPECIAL CARE FACTORS FREQUENCY  PT (By licensed PT), OT (By licensed OT)     PT Frequency: 5x week OT Frequency: 3x week            Contractures Contractures Info: Not present    Additional Factors Info  Code Status, Allergies Code Status Info: Full Allergies Info: Niacin, Contrast Media, Iodine-131, Nsaids           Current Medications (05/07/2022):  This is the current hospital active medication list Current Facility-Administered Medications  Medication Dose Route Frequency Provider Last Rate Last Admin   0.9 %  sodium chloride infusion   Intravenous Continuous Shahmehdi, Seyed A, MD 100 mL/hr at  05/07/22 0630 New Bag at 05/07/22 0630   0.9 %  sodium chloride infusion  250 mL Intravenous PRN Shahmehdi, Seyed A, MD       acetaminophen (TYLENOL) tablet 650 mg  650 mg Oral Q6H PRN Shahmehdi, Seyed A, MD       Or   acetaminophen (TYLENOL) suppository 650 mg  650 mg Rectal Q6H PRN Shahmehdi, Seyed A, MD       acidophilus (RISAQUAD) capsule 2 capsule  2 capsule Oral TID Skipper Cliche A, MD   2 capsule at 05/07/22 0914   alum & mag  hydroxide-simeth (MAALOX/MYLANTA) 200-200-20 MG/5ML suspension 30 mL  30 mL Oral Q6H PRN Shahmehdi, Seyed A, MD       Ampicillin-Sulbactam (UNASYN) 3 g in sodium chloride 0.9 % 100 mL IVPB  3 g Intravenous Q6H Shahmehdi, Seyed A, MD 200 mL/hr at 05/07/22 0927 3 g at 05/07/22 0347   atorvastatin (LIPITOR) tablet 40 mg  40 mg Oral Daily Shahmehdi, Seyed A, MD   40 mg at 05/07/22 0915   bisacodyl (DULCOLAX) EC tablet 5 mg  5 mg Oral Daily PRN Skipper Cliche A, MD       dutasteride (AVODART) capsule 0.5 mg  0.5 mg Oral Daily Shahmehdi, Seyed A, MD   0.5 mg at 05/07/22 4259   hydrALAZINE (APRESOLINE) injection 10 mg  10 mg Intravenous Q4H PRN Shahmehdi, Valeria Batman, MD       HYDROcodone-acetaminophen (NORCO) 10-325 MG per tablet 1 tablet  1 tablet Oral BID PRN Deatra James, MD   1 tablet at 05/07/22 0914   HYDROmorphone (DILAUDID) injection 1 mg  1 mg Intravenous Q2H PRN Shahmehdi, Erling Conte A, MD   1 mg at 05/07/22 0454   insulin aspart (novoLOG) injection 0-9 Units  0-9 Units Subcutaneous TID WC Shahmehdi, Seyed A, MD   1 Units at 05/07/22 0915   ipratropium (ATROVENT) nebulizer solution 0.5 mg  0.5 mg Nebulization Q6H PRN Shahmehdi, Seyed A, MD       isosorbide mononitrate (IMDUR) 24 hr tablet 120 mg  120 mg Oral QPM Shahmehdi, Seyed A, MD   120 mg at 05/06/22 1804   latanoprost (XALATAN) 0.005 % ophthalmic solution 1 drop  1 drop Both Eyes QHS Shahmehdi, Seyed A, MD   1 drop at 05/06/22 2147   levalbuterol (XOPENEX) nebulizer solution 0.63 mg  0.63 mg Nebulization Q6H PRN Shahmehdi, Seyed A, MD       losartan (COZAAR) tablet 25 mg  25 mg Oral Daily Shahmehdi, Seyed A, MD   25 mg at 05/07/22 0914   metoprolol succinate (TOPROL-XL) 24 hr tablet 12.5 mg  12.5 mg Oral Daily Shahmehdi, Seyed A, MD   12.5 mg at 05/07/22 0914   nitroGLYCERIN (NITROSTAT) SL tablet 0.4 mg  0.4 mg Sublingual Q5 Min x 3 PRN Shahmehdi, Seyed A, MD       ondansetron (ZOFRAN) tablet 4 mg  4 mg Oral Q6H PRN Shahmehdi, Seyed A, MD        Or   ondansetron (ZOFRAN) injection 4 mg  4 mg Intravenous Q6H PRN Shahmehdi, Seyed A, MD   4 mg at 05/07/22 0454   senna-docusate (Senokot-S) tablet 1 tablet  1 tablet Oral QHS PRN Shahmehdi, Seyed A, MD       sodium chloride flush (NS) 0.9 % injection 3 mL  3 mL Intravenous Q12H Shahmehdi, Seyed A, MD       sodium chloride flush (NS) 0.9 % injection 3 mL  3 mL  Intravenous Q12H Shahmehdi, Erling Conte A, MD   3 mL at 05/06/22 2148   sodium chloride flush (NS) 0.9 % injection 3 mL  3 mL Intravenous PRN Shahmehdi, Seyed A, MD       sodium phosphate (FLEET) 7-19 GM/118ML enema 1 enema  1 enema Rectal Once PRN Shahmehdi, Seyed A, MD       tamsulosin (FLOMAX) capsule 0.4 mg  0.4 mg Oral QPC supper Shahmehdi, Seyed A, MD   0.4 mg at 05/06/22 1804   torsemide (DEMADEX) tablet 20 mg  20 mg Oral Daily Shahmehdi, Seyed A, MD   20 mg at 05/07/22 0914   traZODone (DESYREL) tablet 25 mg  25 mg Oral QHS PRN Shahmehdi, Seyed A, MD       warfarin (COUMADIN) tablet 2 mg  2 mg Oral Daily Shahmehdi, Seyed A, MD   2 mg at 05/06/22 1801   Warfarin - Pharmacist Dosing Inpatient   Does not apply q1600 Deatra James, MD         Discharge Medications: Please see discharge summary for a list of discharge medications.  Relevant Imaging Results:  Relevant Lab Results:   Additional Information SSN: [place], LCSW

## 2022-05-07 NOTE — TOC Initial Note (Addendum)
Transition of Care [facility]) - Initial/Assessment Note    Patient Details  Name: Troy [name] MRN: [id number] Date of Birth: Sep 16, 1940  Transition of Care [place]) CM/SW Contact:    Shade Flood, LCSW Phone Number: 05/07/2022, 11:30 AM  Clinical Narrative:                  Pt admitted from home. Recommendation is for SNF rehab at dc. Met with pt at bedside to assess and review dc planning.  Pt resides alone. He has a high readmission risk score. Per pt, he has an aide through [facility] 2.5 hours per day M-F. He has a cane, walker, and scooter for DME. Pt states he is able to get to appointments and obtain medications.  Reviewed rehab recommendation and pt agreeable. He has been at [facility] in the past. Pt states that if he has his 20 days, he would use his [facility] Medicare benefits for SNF. If not, he would like to use VA benefits. Pt states he would like a private room at SNF and he wants his infection cleared up before he discharges.   CMS provider options reviewed with pt. Will refer as requested.  TOC will start insurance auth and follow for dc planning.  1614: Updated that pt has a $20/day copay for SNF days 1-20 under his [employer] benefits. Called VA to inquire about pt's eligibility for SNF coverage using VA benefits. Faxed requested form and clinical to the [place] for review. Awaiting response. Will follow up with pt once all information is available. Depending on VA coverage, TOC will work with pt on which SNF and whether he needs [place] or not.   Expected Discharge Plan: Skilled Nursing Facility Barriers to Discharge: Continued Medical Work up   Patient Goals and CMS Choice Patient states their goals for this hospitalization and ongoing recovery are:: rehab CMS Medicare.gov Compare Post Acute Care list provided to:: Patient Choice offered to / list presented to : Patient [place] ownership interest in [facility].provided to:: Patient    Expected Discharge Plan  and Services In-house Referral: Clinical Social Work   Post Acute Care Choice: [place] Living arrangements for the past 2 months: [place]                                      Prior Living Arrangements/Services Living arrangements for the past 2 months: Single Family Home Lives with:: Self Patient language and need for interpreter reviewed:: Yes Do you feel safe going back to the place where you live?: Yes      Need for Family Participation in Patient Care: No (Comment) Care giver support system in place?: Yes (comment) Current home services: DME, Other (comment) (aide through [place]) Criminal Activity/Legal Involvement Pertinent to Current Situation/Hospitalization: No - Comment as needed  Activities of Daily Living Home Assistive Devices/Equipment: Environmental consultant (specify type), Dentures (specify type), Eyeglasses ADL Screening (condition at time of admission) Patient's cognitive ability adequate to safely complete daily activities?: Yes Is the patient deaf or have difficulty hearing?: Yes Does the patient have difficulty seeing, even when wearing glasses/contacts?: No Does the patient have difficulty concentrating, remembering, or making decisions?: No Patient able to express need for assistance with ADLs?: Yes Does the patient have difficulty dressing or bathing?: No Independently performs ADLs?: Yes (appropriate for developmental age) Does the patient have difficulty walking or climbing stairs?: Yes  Weakness of Legs: Both Weakness of Arms/Hands: None  Permission Sought/Granted Permission sought to share information with : Facility Art therapist granted to share information with : Yes, Verbal Permission Granted     Permission granted to share info w AGENCY: snfs        Emotional Assessment Appearance:: Appears stated age Attitude/Demeanor/Rapport: Engaged Affect (typically observed): Pleasant Orientation: : Oriented to Self,  Oriented to Place, Oriented to  Time, Oriented to Situation Alcohol / Substance Use: Not Applicable Psych Involvement: No (comment)  Admission diagnosis:  Orchitis and epididymitis [N45.3] Patient Active Problem List   Diagnosis Date Noted   Orchitis and epididymitis 05/06/2022   Hematuria 01/03/2022   Gross hematuria 01/02/2022   Acute urinary obstruction 01/02/2022   Epididymoorchitis 11/28/2021   Chronic heart failure with preserved ejection fraction (HFpEF) ([place]) 11/28/2021   Acquired thrombophilia ([place]) 11/06/2021   Upper airway cough syndrome 02/04/2021   Nocturnal hypoxemia 06/27/2020   DOE (dyspnea on exertion) 06/07/2020   Rt Sided Community acquired pneumonia 03/26/2020   Microcytic anemia 03/26/2020   --Severe pulmonary HTN, 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, [place] - [address] [place] [address] [place]  [address] Phone: [phone] Fax: [phone]  [facility] - [place], [place] [address] Phone: [phone] Fax: [phone]     Social Determinants of Health (SDOH) Social History: SDOH Screenings   Food Insecurity: No Food Insecurity (01/02/2022)  Housing: Low Risk  (01/02/2022)  Transportation Needs: No Transportation Needs (01/02/2022)  Utilities: Not At Risk (01/02/2022)  Tobacco Use: Medium Risk (05/06/2022)   SDOH Interventions:     Readmission Risk Interventions    05/07/2022   11:28 AM 11/07/2021   12:48 PM  Readmission Risk Prevention Plan  Transportation Screening Complete Complete  Home Care Screening  Complete  Medication Review (RN CM)  Complete  Medication Review Press photographer) Complete   HRI or Home Care Consult Complete   SW Recovery Care/Counseling Consult Complete   Palliative Care Screening Not Applicable   Skilled Nursing Facility Complete

## 2022-05-07 NOTE — Progress Notes (Signed)
ANTICOAGULATION CONSULT NOTE -   Pharmacy Consult for Warfarin Indication: atrial fibrillation  Allergies  Allergen Reactions   Niacin Itching and Rash    Burning sensation   Contrast Media [Iodinated Contrast Media] Nausea And Vomiting   Iodine-131 Nausea And Vomiting   Nsaids Other (See Comments)    Taking Coumadin    Patient Measurements: Height: '5\' 10"'$  (177.8 cm) Weight: 95.1 kg (209 lb 10.5 oz) IBW/kg (Calculated) : 73  Vital Signs: Temp: 98.3 F (36.8 C) (02/05 0500) Temp Source: Oral (02/05 0500) BP: 129/72 (02/05 0500) Pulse Rate: 66 (02/05 0500)  Labs: Recent Labs    05/06/22 1047 05/07/22 0433  HGB 11.7* 9.6*  HCT 35.7* 31.6*  PLT 194 179  APTT  --  45*  LABPROT 31.1* 30.0*  INR 3.0* 2.9*  CREATININE 1.08 0.99     Estimated Creatinine Clearance: 70 mL/min (by C-G formula based on SCr of 0.99 mg/dL).   Medical History: Past Medical History:  Diagnosis Date   AAA (abdominal aortic aneurysm) ([place])    Followed by Dr. Sherren Mocha Early   Arthritis    CAD (coronary artery disease)    a. CABG 2000.   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 mild chronic .[name]... nonischemic   CHF (congestive heart failure) (HCC)    Chronic edema    Coronary artery disease    a. Nuclear, January, 2008, no ischemia b. Cath 08/2012- 1/4 patent grafts, RCA CTO, no flow-limiting disease, medically managed   Diabetes mellitus without complication ([place])    Dizziness 02/2011   Dyslipidemia    Fall    GERD (gastroesophageal reflux disease)    TAKES TUMS & ROLAIDS AS NEEDED   Heart murmur    Hx of CABG 2000   Hypertension    Myocardial infarction ([place]) 1999   Neck pain 02/2011   Pneumonia    Pulmonary hypertension ([place])    Renal artery stenosis (HCC)    50-70%   S/P femoropopliteal bypass surgery    Dr. Donnetta Hutching   Sinus bradycardia    Asymptomatic    Medications:  See med rec  Assessment: Presented with chief  complaint of right testicular pain. He has had urinary issues recently  and was passing blood clots. He is chronically anticoagulated for afib with warfarin. Patient told me he tries to keep INR around 2.5-3. INR today is 3. Likely with antibiotics starting for presumed orchitis/epipidiymitis would anticipate INR increasing so may need to adjust dose. Pharmacy asked to dose Warfarin.   INR 2.9 , therapeutic. No bleeding or clots today.  Hgb 11.7> 9.6, Likely some dilutional. Home dose is '2mg'$  daily  Goal of Therapy:  INR 2-3 Monitor platelets by anticoagulation protocol: Yes   Plan:  Warfarin '2mg'$  daily PO daily PT-INR daily Monitor for S/S of bleeding  Isac Sarna, BS Pharm D, BCPS Clinical Pharmacist 05/07/2022,8:03 AM

## 2022-05-07 NOTE — Assessment & Plan Note (Signed)
-  PT/OT consulted-status post evaluation-recommending SNF -She lives alone continues to decline--- agreeable to SNF -TOC consulted-patient is a [place] patient will assist with placement

## 2022-05-07 NOTE — Evaluation (Signed)
Physical Therapy Evaluation Patient Details Name: Troy [name] MRN: [id number] DOB: Jul 02, 1940 Today's Date: 05/07/2022  History of Present Illness  Troy [name] is a 82 year old male with extensive history of HLD, CABG, CAD, DM 2, HTN, A-fib (on Coumadin), chronic anemia, epididymoorchitis .[name]... Presented with chief complaint of right testicular pain.     Patient reports that he has had some urinary transient and epididymoorchitis in August 2023 was seen and treated by urologist Dr. [name].     Apparently he has visited Dr. [name] in past week for urinary retention Foley catheter was placed on January 25th, currently was follow-up for catheter obstruction which cleared by flushing.  He was seen in office again 5 days ago for catheter removal, and post void residual on May 01, 2022.  Urine culture from January 25 has been negative.... Patient reports that he has been able to drain his bladder, has some incontinency.  He is bearing briefs..  Noted he was passing blood clots last week which has resolved.     Today he is reporting of significant right testicular pain started last night.  Tylenol did not help.  This morning was quite worse was associate with nausea vomiting but no fever   Clinical Impression  Patient had much difficulty sitting up at bedside due to c/o severe pain with any pressure to right testicle, required frequent rest breaks and limited to a few side steps at bedside before having to sit.  Patient on room air during transfer to chair with SpO2 at 97% and left on room air - nurse notified.  Patient will benefit from continued skilled physical therapy in hospital and recommended venue below to increase strength, balance, endurance for safe ADLs and gait.        Recommendations for follow up therapy are one component of a multi-disciplinary discharge planning process, led by the attending physician.  Recommendations may be updated based on patient status, additional functional  criteria and insurance authorization.  Follow Up Recommendations Skilled nursing-short term rehab (<3 hours/day) Can patient physically be transported by private vehicle: No    Assistance Recommended at Discharge Set up Supervision/Assistance  Patient can return home with the following  A lot of help with bathing/dressing/bathroom;A lot of help with walking and/or transfers;Help with stairs or ramp for entrance;Assistance with cooking/housework    Equipment Recommendations None recommended by PT  Recommendations for Other Services       Functional Status Assessment Patient has had a recent decline in their functional status and demonstrates the ability to make significant improvements in function in a reasonable and predictable amount of time.     Precautions / Restrictions Precautions Precautions: Fall Restrictions Weight Bearing Restrictions: No      Mobility  Bed Mobility Overal bed mobility: Needs Assistance Bed Mobility: Rolling, Sidelying to Sit Rolling: Mod assist Sidelying to sit: Mod assist, Max assist       General bed mobility comments: increased time, labored movement with c/o severe pain with pressure to right testicle    Transfers Overall transfer level: Needs assistance Equipment used: Rolling walker (2 wheels) Transfers: Sit to/from Stand, Bed to chair/wheelchair/BSC Sit to Stand: Mod assist, Max assist   Step pivot transfers: Mod assist       General transfer comment: slow labored movement, severe pain right testicle    Ambulation/Gait Ambulation/Gait assistance: Min assist, Mod assist Gait Distance (Feet): 5 Feet Assistive device: Rolling walker (2 wheels) Gait Pattern/deviations: Decreased step length - right, Decreased step length -  left, Decreased stride length Gait velocity: slow     General Gait Details: limited to a few slow labored side steps due to fatigue, generalized weakness and c/o severe right testicle pain  Stairs             Wheelchair Mobility    Modified Rankin (Stroke Patients Only)       Balance Overall balance assessment: Needs assistance Sitting-balance support: Feet supported, No upper extremity supported Sitting balance-Leahy Scale: Fair Sitting balance - Comments: seated at EOB   Standing balance support: During functional activity, Bilateral upper extremity supported Standing balance-Leahy Scale: Poor Standing balance comment: fair/poor using RW                             Pertinent Vitals/Pain Pain Assessment Pain Assessment: Faces Faces Pain Scale: Hurts worst Pain Location: right testicle Pain Descriptors / Indicators: Grimacing, Discomfort, Guarding, Sharp, Moaning Pain Intervention(s): Limited activity within patient's tolerance, Monitored during session, Repositioned, Premedicated before session    Home Living Family/patient expects to be discharged to:: Private residence Living Arrangements: Alone Available Help at Discharge: Family;Available PRN/intermittently Type of Home: House Home Access: Ramped entrance       Home Layout: One level Home Equipment: Conservation officer, nature (2 wheels);Wheelchair - manual;Shower seat - built in;Shower seat;Grab bars - tub/shower      Prior Function Prior Level of Function : Independent/Modified Independent;Driving             Mobility Comments: Household and  short distanced community ambulator using RW, drives ADLs Comments: Independent     Hand Dominance        Extremity/Trunk Assessment   Upper Extremity Assessment Upper Extremity Assessment: Defer to OT evaluation    Lower Extremity Assessment Lower Extremity Assessment: Generalized weakness    Cervical / Trunk Assessment Cervical / Trunk Assessment: Normal  Communication   Communication: No difficulties  Cognition Arousal/Alertness: Awake/alert Behavior During Therapy: WFL for tasks assessed/performed Overall Cognitive Status: Within Functional Limits  for tasks assessed                                          General Comments      Exercises     Assessment/Plan    PT Assessment Patient needs continued PT services  PT Problem List Decreased strength;Decreased activity tolerance;Decreased balance;Decreased mobility;Pain       PT Treatment Interventions DME instruction;Gait training;Stair training;Functional mobility training;Therapeutic activities;Therapeutic exercise;Patient/family education;Balance training    PT Goals (Current goals can be found in the Care Plan section)  Acute Rehab PT Goals Patient Stated Goal: return home with family to assist PT Goal Formulation: With patient Time For Goal Achievement: 05/21/22 Potential to Achieve Goals: Good    Frequency Min 3X/week     Co-evaluation               AM-PAC PT "6 Clicks" Mobility  Outcome Measure Help needed turning from your back to your side while in a flat bed without using bedrails?: A Lot Help needed moving from lying on your back to sitting on the side of a flat bed without using bedrails?: A Lot Help needed moving to and from a bed to a chair (including a wheelchair)?: A Lot Help needed standing up from a chair using your arms (e.g., wheelchair or bedside chair)?: A Lot Help needed to walk  in hospital room?: A Lot Help needed climbing 3-5 steps with a railing? : A Lot 6 Click Score: 12    End of Session   Activity Tolerance: Patient tolerated treatment well;Patient limited by fatigue;Patient limited by pain Patient left: in chair;with call bell/phone within reach Nurse Communication: Mobility status PT Visit Diagnosis: Unsteadiness on feet (R26.81);Other abnormalities of gait and mobility (R26.89);Muscle weakness (generalized) (M62.81)    Time: 6122-4497 PT Time Calculation (min) (ACUTE ONLY): 31 min   Charges:   PT Evaluation $PT Eval Moderate Complexity: 1 Mod PT Treatments $Therapeutic Activity: 23-37 mins         2:13 PM, 05/07/22 Lonell Grandchild, MPT Physical Therapist with [facility] [phone] office [phone] mobile phone

## 2022-05-07 NOTE — Progress Notes (Signed)
[facility] notified of pts hospital admission. [facility] notification ID is [phone].

## 2022-05-07 NOTE — Progress Notes (Signed)
PROGRESS NOTE    Patient: Troy [name]                            PCP: Sharilyn Sites, MD                    DOB: 22-Apr-1940            DOA: 05/06/2022 [id number]             DOS: 05/07/2022, 11:55 AM   LOS: 1 day   Date of Service: The patient was seen and examined on 05/07/2022  Subjective:   The patient was seen and examined this morning. Hemodynamically stable.  Afebrile normotensive, noting for leukocytosis: WBC of 25.9 No issues overnight .  Brief Narrative:   Troy [name] is a 82 year old male with extensive history of HLD, CABG, CAD, DM 2, HTN, A-fib (on Coumadin), chronic anemia, epididymoorchitis .[name]... Presented with chief complaint of right testicular pain.  Patient reports that he has had some urinary transient and epididymoorchitis in August 2023 was seen and treated by urologist Dr. [name].  Apparently he has visited Dr. [name] in past week for urinary retention Foley catheter was placed on January 25th, currently was follow-up for catheter obstruction which cleared by flushing.  He was seen in office again 5 days ago for catheter removal, and post void residual on May 01, 2022. Urine culture from January 25 has been negative.... Patient reports that he has been able to drain his bladder, has some incontinency.  He is bearing briefs..  Noted he was passing blood clots last week which has resolved.  Today he is reporting of significant right testicular pain started last night.  Tylenol did not help.  This morning was quite worse was associate with nausea vomiting but no fever.   ED course: Blood pressure 132/61, pulse 76, temperature 98.4 F (36.9 C), resp. rate 20, SpO2 97 % On RA.  BMP within normal limits with exception of glucose of 201, calcium 8.8, magnesium 1.2, WBC 17.1, UA small hemoglobin, moderate leukocytosis, negative for any nitrites, >50 RBC, rare bacteria, WBC 11-20  Scrotal ultrasound: 4 mm peripheral right scrotal cyst possibly along  the rete testis. New heterogeneity of the right testis and mild right testicular hyperemia suggesting orchitis.   Requested patient to be admitted for IV antibiotics, pain management.     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 ----Pulmonary HTN    Debility     Assessment and Plan: * Orchitis and epididymitis -Continuing IV antibiotics, pain management -Afebrile, normotensive, WBC 25.9, -Continuing with IV antibiotics of Unasyn -Pending cultures urine and blood   -Gentle IV fluid hydration  Testicular ultrasound: MPRESSION: 1. New heterogeneity of the right testis and mild right testicular hyperemia suggesting orchitis. 2. No evidence of testicular torsion.  -Follow-up with urine cultures -Consulted urologist --- appreciate further evaluation recommendations His primary urologist Dr. [name]    Acute urinary obstruction -History of urinary retention, -Monitoring closely, in and out as needed, if continues to progress anticipate temporary Foley catheter placement  Chronic heart failure with preserved ejection fraction (HFpEF) ([place]) -Will continue  home medications including losartan,  Aldactone -Demadex on hold will restart in a.m. -Currently patient needs gentle IV hydration for leukocytosis, orchitis infection  Atrial fibrillation (HCC) -Stable continuing rate control medications including Toprol-XL, and Coumadin -INR therapeutic  Type 2 diabetes mellitus (HCC) -Currently on metformin, will hold for now -Checking CBG before every meal/with SSI coverage -Last A1c October 2023 6.9 -Rechecking A1c 6.9  --Severe pulmonary HTN, 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 ----Pulmonary  HTN  -History of pulmonary hypertension -Gentle IV fluid hydration -Monitoring closely  CORONARY ARTERY BYPASS GRAFT, HX OF - Continue current medication including: Imdur, statins, losartan, Toprol, Coumadin  Hyperlipidemia -Continue statins  Debility -PT/OT consulted-status post evaluation-recommending SNF -She lives alone continues to decline--- agreeable to SNF -TOC consulted-[facility] is [facility] will assist with placement   -------------------------------------------------------------------------------------------------------------------------- Nutritional status:  The patient's BMI is: Body mass index is 30.08 kg/m. I agree with the assessment and plan as outlined e:   ----------------------------------------------------------------------------------------------------------------------- Cultures; Urine Culture  >>>  Blood cultures >>  -----------------------------------------------------------------------------------------------------------------------  DVT prophylaxis:  TED hose Start: 05/06/22 1446 SCDs Start: 05/06/22 1446 warfarin (COUMADIN) tablet 2 mg   Code Status:   Code Status: Full Code  Family Communication: No family member present at bedside- attempt will be made to update daily The above findings and plan of care has been discussed with patient (and family)  in detail,  they expressed understanding and agreement of above. -Advance care planning has been discussed.   Admission status:   Status is: Inpatient Remains inpatient appropriate because: Needing IV antibiotics, pain management   Disposition: From  - home             Planning for discharge in 1-2 days: to   Procedures:   No admission procedures for hospital encounter.   Antimicrobials:  Anti-infectives (From admission, onward)    Start     Dose/Rate Route Frequency Ordered Stop   05/06/22 2200  Ampicillin-Sulbactam (UNASYN) 3 g in sodium chloride 0.9 % 100 mL IVPB  Status:   Discontinued        3 g 200 mL/hr over 30 Minutes Intravenous Every 8 hours 05/06/22 1531 05/06/22 1552   05/06/22 2200  Ampicillin-Sulbactam (UNASYN) 3 g in sodium chloride 0.9 % 100 mL IVPB        3 g 200 mL/hr over 30 Minutes Intravenous Every 6 hours 05/06/22 1552     05/06/22 1500  cefTRIAXone (ROCEPHIN) injection 1 g  Status:  Discontinued        1 g Intramuscular Every 24 hours 05/06/22 1448 05/06/22 1521   05/06/22 1500  doxycycline (VIBRAMYCIN) capsule 100 mg  Status:  Discontinued        100 mg Oral Every 12 hours 05/06/22 1452 05/06/22 1521   05/06/22 1245  levofloxacin (LEVAQUIN) IVPB 500 mg        500 mg 100 mL/hr over 60 Minutes Intravenous  Once 05/06/22 1238 05/06/22 1435        Medication:   acidophilus  2 capsule Oral TID   atorvastatin  40 mg Oral Daily   dutasteride  0.5 mg Oral Daily   insulin aspart  0-9 Units Subcutaneous TID WC   isosorbide mononitrate  120 mg Oral QPM   latanoprost  1 drop Both Eyes QHS   losartan  25 mg Oral Daily   metoprolol succinate  12.5 mg Oral Daily   sodium chloride flush  3 mL Intravenous Q12H   sodium chloride flush  3 mL Intravenous Q12H  tamsulosin  0.4 mg Oral QPC supper   torsemide  20 mg Oral Daily   warfarin  2 mg Oral Daily   Warfarin - Pharmacist Dosing Inpatient   Does not apply q1600    sodium chloride, acetaminophen **OR** acetaminophen, alum & mag hydroxide-simeth, bisacodyl, hydrALAZINE, HYDROcodone-acetaminophen, HYDROmorphone (DILAUDID) injection, ipratropium, levalbuterol, nitroGLYCERIN, ondansetron **OR** ondansetron (ZOFRAN) IV, senna-docusate, sodium chloride flush, sodium phosphate, traZODone   Objective:   Vitals:   05/06/22 1810 05/06/22 2030 05/07/22 0500 05/07/22 0851  BP:  (!) 156/65 129/72 (!) 110/58  Pulse:  68 66 63  Resp:  '18 18 17  '$ Temp:  99.3 F (37.4 C) 98.3 F (36.8 C) 99 F (37.2 C)  TempSrc:  Oral Oral Oral  SpO2:  98% 98% 98%  Weight: 95.7 kg  95.1 kg   Height: '5\' 10"'$  (1.778  m)       Intake/Output Summary (Last 24 hours) at 05/07/2022 1155 Last data filed at 05/07/2022 8921 Gross per 24 hour  Intake 357.46 ml  Output 700 ml  Net -342.54 ml   Filed Weights   05/06/22 0938 05/06/22 1810 05/07/22 0500  Weight: 91.6 kg 95.7 kg 95.1 kg     Physical examination:   Constitution:  Alert, cooperative, no distress,  Appears calm and comfortable  Psychiatric:   Normal and stable mood and affect, cognition intact,   HEENT:        Normocephalic, PERRL, otherwise with in Normal limits  Chest:         Chest symmetric Cardio vascular:  S1/S2, RRR, No murmure, No Rubs or Gallops  pulmonary: Clear to auscultation bilaterally, respirations unlabored, negative wheezes / crackles Abdomen: Soft, non-tender, non-distended, bowel sounds,no masses, no organomegaly Muscular skeletal:  Limited exam - in bed, able to move all 4 extremities,   Neuro: CNII-XII intact. , normal motor and sensation, reflexes intact  Extremities: No pitting edema lower extremities, +2 pulses  Skin: Dry, warm to touch, negative for any Rashes, No open wounds Minimum erythema edema but excessive tenderness in the testicle area   ------------------------------------------------------------------------------------------------------------------------------------------    LABs:     Latest Ref Rng & Units 05/07/2022    4:33 AM 05/06/2022   10:47 AM 01/17/2022    7:54 AM  CBC  WBC 4.0 - 10.5 K/uL 25.9  17.1  8.8   Hemoglobin 13.0 - 17.0 g/dL 9.6  11.7  11.3   Hematocrit 39.0 - 52.0 % 31.6  35.7  35.6   Platelets 150 - 400 K/uL 179  194  202       Latest Ref Rng & Units 05/07/2022    4:33 AM 05/06/2022   10:47 AM 03/29/2022    8:43 AM  CMP  Glucose 70 - 99 mg/dL 144  201  115   BUN 8 - 23 mg/dL '19  22  15   '$ Creatinine 0.61 - 1.24 mg/dL 0.99  1.08  1.04   Sodium 135 - 145 mmol/L 134  137  142   Potassium 3.5 - 5.1 mmol/L 3.6  3.8  4.4   Chloride 98 - 111 mmol/L 101  101  100   CO2 22 - 32 mmol/L '24   25  25   '$ Calcium 8.9 - 10.3 mg/dL 7.7  8.8  9.4   Total Protein 6.5 - 8.1 g/dL  7.2    Total Bilirubin 0.3 - 1.2 mg/dL  0.8    Alkaline Phos 38 - 126 U/L  75    AST 15 - 41  U/L  29    ALT 0 - 44 U/L  11         Micro Results Recent Results (from the past 240 hour(s))  Culture, blood (Routine X 2) w Reflex to ID Panel     Status: None (Preliminary result)   Collection Time: 05/06/22  2:48 PM   Specimen: BLOOD LEFT ARM  Result Value Ref Range Status   Specimen Description BLOOD LEFT ARM BOTTLES DRAWN AEROBIC AND ANAEROBIC  Final   Special Requests Blood Culture adequate volume  Final   Culture   Final    NO GROWTH < 24 HOURS Performed at [facility], [address], [place], [place] [address]    Report Status PENDING  Incomplete  Culture, blood (Routine X 2) w Reflex to ID Panel     Status: None (Preliminary result)   Collection Time: 05/06/22  2:53 PM   Specimen: BLOOD RIGHT ARM  Result Value Ref Range Status   Specimen Description   Final    BLOOD RIGHT ARM BOTTLES DRAWN AEROBIC AND ANAEROBIC   Special Requests Blood Culture adequate volume  Final   Culture   Final    NO GROWTH < 24 HOURS Performed at [facility], [address], [place], [place] [address]    Report Status PENDING  Incomplete    Radiology Reports US SCROTUM W/DOPPLER  Result Date: 05/06/2022 CLINICAL DATA:  Sudden onset right testicular pain and swelling EXAM: SCROTAL ULTRASOUND DOPPLER ULTRASOUND OF THE TESTICLES TECHNIQUE: Complete ultrasound examination of the testicles, epididymis, and other scrotal structures was performed. Color and spectral Doppler ultrasound were also utilized to evaluate blood flow to the testicles. COMPARISON:  Scrotal ultrasound 01/03/2022 FINDINGS: Right testicle Measurements: 3.2 by 4.6 by 3.1 cm. No mass or microlithiasis visualized. 4 mm peripheral right scrotal cyst possibly along the rete testis. New heterogeneity of the right testis and mild right testicular hyperemia  suggesting orchitis. Left testicle Measurements: 4.6 by 2.2 by 3.3 cm. Unchanged 6 mm simple appearing cyst. Ectasia of rete testis unchanged. Right epididymis:  Normal in size and appearance. Left epididymis:  0.7 cm epididymal cyst or spermatocele unchanged. Hydrocele:  None visualized. Varicocele:  None visualized. Pulsed Doppler interrogation of both testes demonstrates normal low resistance arterial and venous waveforms bilaterally. IMPRESSION: 1. New heterogeneity of the right testis and mild right testicular hyperemia suggesting orchitis. 2. No evidence of testicular torsion. Electronically Signed   By: Van Clines M.D.   On: 05/06/2022 13:10    SIGNED: Deatra James, MD, FHM. FAAFP. [facility] - [facility] Time spent > 35 min.  In seeing, evaluating and examining the patient. Reviewing medical records, labs, drawn plan of care. [facility],  Pager (please use [email or web address] to page/ text) Please use Epic Secure Chat for non-urgent communication (7AM-7PM)  If 7PM-7AM, please contact night-coverage [email or web address], 05/07/2022, 11:55 AM

## 2022-05-07 NOTE — Consult Note (Addendum)
Consultation: Right epididymoorchitis Requested by: Dr. Skipper Cliche  History of Present Illness: Troy [name] is a 82 year old male patient of Dr. [name].  He has a history of prostate cancer and brachytherapy.  He has a history of urgency and urge incontinence status post PTNS.  He had a recent episode of urinary retention and passed a voiding trial about a week ago.  Urine culture at that time grew less than 10,000 CFU and was not speciated.  A prior urine culture in August 2023 grew E. coli and October 2023 grew Enterococcus.  A couple of days ago patient developed right scrotal pain and swelling.  He was admitted with blood cultures pending.  Scrotal ultrasound revealed new heterogeneity of the right testis and mild right testicular hyperemia.  He was started on Levaquin. He continues to have right scrotal pain and swelling.  White count was up today to 25.9.  Creatinine 0.99.  Blood cultures preliminarily negative.  UA showed rare bacteria, greater than 50 red cells per high-powered field, 11-20 white cells, negative nitrite, positive leukocytes.  He has had no fever.  Otherwise feels well. He feels like he is emptying his bladder although he has his typical urgency and urge incontinence.  He is voiding into the pure wick.  Minimal dysuria.  No gross hematuria.      Past Medical History:  Diagnosis Date   AAA (abdominal aortic aneurysm) ([place])    Followed by Dr. Sherren Mocha Early   Arthritis    CAD (coronary artery disease)    a. CABG 2000.   Cancer [facility])    Prostate:  Radiation Tx   Carotid artery disease ([place])    Chest pain    precordial. mild chronic .[name]... nonischemic   CHF (congestive heart failure) (HCC)    Chronic edema    Coronary artery disease    a. Nuclear, January, 2008, no ischemia b. Cath 08/2012- 1/4 patent grafts, RCA CTO, no flow-limiting disease, medically managed   Diabetes mellitus without complication ([place])    Dizziness 02/2011   Dyslipidemia    Fall    GERD  (gastroesophageal reflux disease)    TAKES TUMS & ROLAIDS AS NEEDED   Heart murmur    Hx of CABG 2000   Hypertension    Myocardial infarction [facility]) 1999   Neck pain 02/2011   Pneumonia    Pulmonary hypertension ([place])    Renal artery stenosis (HCC)    50-70%   S/P femoropopliteal bypass surgery    Dr. Donnetta Hutching   Sinus bradycardia    Asymptomatic   Past Surgical History:  Procedure Laterality Date   CARDIAC CATHETERIZATION  09/26/2012   1/4 patent bypass (occluded SVG-PDA, SVG-OM, LIMA-LAD), SVG-diagonal patent and fills the diagonal and LAD, distal RCA occlusion with left to right collateralization, patent circumflex, LAD with no flow-limiting disease and antegrade flow competitively from SVG-diagonal; EF 60-65%   COLONOSCOPY  11/30/2009   [id number]. next TCS 11/2019   COLONOSCOPY WITH PROPOFOL N/A 12/18/2019   Procedure: COLONOSCOPY WITH PROPOFOL;  Surgeon: Harvel Quale, MD;  [facility];  Service: Gastroenterology;  Laterality: N/A;  1030   CORONARY ARTERY BYPASS GRAFT  2000   CYSTOSCOPY N/A 08/31/2021   Procedure: CYSTOSCOPY;  Surgeon: Cleon Gustin, MD;  Location: [facility] ORS;  Service: Urology;  Laterality: N/A;  pt knows to arrive at 7:00   [place] N/A 03/22/2022   Procedure: CYSTOSCOPY WITH FULGERATION;  Surgeon: Cleon Gustin, MD;  Location: [facility] ORS;  Service: Urology;  Laterality: N/A;   ESOPHAGOGASTRODUODENOSCOPY N/A 08/12/2013   YNW:[id number] peptic stricture with erosive refluxesophagitis - status post Maloney dilation. Hiatal hernia. Abnormalgastric mucosa. Deformity of the pyloric channel suggestive ofprior peptic ulcer disease. Duodenal bulbar diverticulum Statuspost gastric biopsy. h.pylori   LEFT HEART CATHETERIZATION WITH CORONARY/GRAFT ANGIOGRAM N/A 09/26/2012   Procedure: LEFT HEART CATHETERIZATION WITH Beatrix Fetters;  Surgeon: Burnell Blanks, MD;  Location: [facility] CATH LAB;  Service:  Cardiovascular;  Laterality: N/A;   MALONEY DILATION N/A 08/12/2013   Procedure: Venia Minks DILATION;  Surgeon: Daneil Dolin, MD;  Location: [facility];  Service: Endoscopy;  Laterality: N/A;   POLYPECTOMY  12/18/2019   Procedure: POLYPECTOMY;  Surgeon: Harvel Quale, MD;  Location: [facility];  Service: Gastroenterology;;  ascending colon polyp    PR VEIN BYPASS GRAFT,AORTO-FEM-POP Right 07/19/1999   PR VEIN BYPASS GRAFT,AORTO-FEM-POP Left 05/02/2006   RIGHT HEART CATH AND CORONARY/GRAFT ANGIOGRAPHY N/A 04/11/2020   Procedure: RIGHT HEART CATH AND CORONARY/GRAFT ANGIOGRAPHY;  Surgeon: Larey Dresser, MD;  Location: [place] CV LAB;  Service: Cardiovascular;  Laterality: N/A;   SAVORY DILATION N/A 08/12/2013   Procedure: SAVORY DILATION;  Surgeon: Daneil Dolin, MD;  [facility];  Service: Endoscopy;  Laterality: N/A;   TRANSURETHRAL RESECTION OF BLADDER TUMOR N/A 08/31/2021   Procedure: TRANSURETHRAL RESECTION OF BLADDER TUMOR (TURBT);  Surgeon: Cleon Gustin, MD;  Location: [facility] ORS;  Service: Urology;  Laterality: N/A;   WRIST SURGERY     cyst removal    Home Medications:  Medications Prior to Admission  Medication Sig Dispense Refill Last Dose   warfarin (COUMADIN) 2 MG tablet Take 2 mg by mouth daily.   05/05/2022 at 1900   acetaminophen (TYLENOL) 325 MG tablet Take 2 tablets (650 mg total) by mouth every 6 (six) hours as needed for mild pain, fever or headache (or Fever >/= 101). (Patient taking differently: Take 325-650 mg by mouth every 6 (six) hours as needed for mild pain, fever or headache (or Fever >/= 101).) 12 tablet 0    albuterol (VENTOLIN HFA) 108 (90 Base) MCG/ACT inhaler Inhale 2 puffs into the lungs every 6 (six) hours as needed for wheezing or shortness of breath. 1 each 2    aspirin 81 MG EC tablet Take 1 tablet (81 mg total) by mouth daily with breakfast. 30 tablet 3    atorvastatin (LIPITOR) 40 MG tablet Take 1 tablet (40 mg total) by  mouth daily. 90 tablet 1    doxycycline (VIBRAMYCIN) 100 MG capsule Take 1 capsule (100 mg total) by mouth every 12 (twelve) hours. (Patient not taking: Reported on 05/06/2022) 28 capsule 0 Not Taking   dutasteride (AVODART) 0.5 MG capsule Take 1 capsule (0.5 mg total) by mouth daily. 30 capsule 11    famotidine (PEPCID) 20 MG tablet One after supper (Patient taking differently: Take 20 mg by mouth daily. One after supper) 30 tablet 11    HYDROcodone-acetaminophen (NORCO) 10-325 MG tablet Take 1 tablet by mouth 2 (two) times daily as needed for moderate pain. 30 tablet 0    ipratropium (ATROVENT) 0.03 % nasal spray Place 2 sprays into both nostrils as needed for rhinitis.      isosorbide mononitrate (IMDUR) 60 MG 24 hr tablet Take 2 tablets (120 mg total) by mouth every evening. 90 tablet 6    latanoprost (XALATAN) 0.005 % ophthalmic solution Place 1 drop into both eyes at bedtime.      losartan (COZAAR) 25 MG tablet  Take 1 tablet (25 mg total) by mouth daily. 30 tablet 4    metFORMIN (GLUCOPHAGE) 500 MG tablet Take 500 mg by mouth 2 (two) times daily.      metoprolol succinate (TOPROL-XL) 25 MG 24 hr tablet Take 1 tablet (25 mg total) by mouth daily. Pt. Needs to make an appt. With Cardiologist in order to receive further refills. Thank You. 1st Attempt. (Patient taking differently: Take 12.5 mg by mouth daily.) 30 tablet 0    nitroGLYCERIN (NITROSTAT) 0.4 MG SL tablet Place 1 tablet (0.4 mg total) under the tongue every 5 (five) minutes x 3 doses as needed for chest pain. 25 tablet 3    OXYGEN Inhale 3 L into the lungs at bedtime.      pantoprazole (PROTONIX) 40 MG tablet Take 1 tablet (40 mg total) by mouth daily. Take 30-60 min before first meal of the day      spironolactone (ALDACTONE) 25 MG tablet Take 1 tablet (25 mg total) by mouth daily. 90 tablet 2    tamsulosin (FLOMAX) 0.4 MG CAPS capsule Take 1 capsule (0.4 mg total) by mouth daily after supper. 90 capsule 3    torsemide (DEMADEX) 10 MG  tablet Take 10-20 mg by mouth See admin instructions. Take 10 mg daily, may increase to 20 mg as needed for weight gain      Vitamin D, Ergocalciferol, (DRISDOL) 1.25 MG (50000 UNIT) CAPS capsule Take 1 capsule by mouth every Sunday.      Allergies:  Allergies  Allergen Reactions   Niacin Itching and Rash    Burning sensation   Contrast Media [Iodinated Contrast Media] Nausea And Vomiting   Iodine-131 Nausea And Vomiting   Nsaids Other (See Comments)    Taking Coumadin    Family History  Problem Relation Age of Onset   Deep vein thrombosis Father    Lung cancer Sister    Diabetes Sister    Heart disease Sister        After age 16   Hyperlipidemia Sister    Hypertension Sister    Lung cancer Sister    Breast cancer Sister    Hypertension Mother    Diabetes Sister    Coronary artery disease Other        family hx of   Cancer Brother        "crab cancer"   Heart disease Brother    Heart attack Brother    Heart attack Daughter    Colon cancer Neg Hx    Stroke Neg Hx    Social History:  reports that he quit smoking about 4 years ago. His smoking use included cigarettes. He has a 140.00 pack-year smoking history. He has never used smokeless tobacco. He reports current alcohol use. He reports that he does not use drugs.  ROS: A complete review of systems was performed.  All systems are negative except for pertinent findings as noted. Review of Systems  All other systems reviewed and are negative.    Physical Exam:  Vital signs in last 24 hours: Temp:  [98.3 F (36.8 C)-99.3 F (37.4 C)] 99 F (37.2 C) (02/05 0851) Pulse Rate:  [35-76] 63 (02/05 0851) Resp:  [17-20] 17 (02/05 0851) BP: (110-156)/(58-72) 110/58 (02/05 0851) SpO2:  [97 %-99 %] 98 % (02/05 0851) Weight:  [95.1 kg-95.7 kg] 95.1 kg (02/05 0500) General:  Alert and oriented, No acute distress HEENT: Normocephalic, atraumatic Cardiovascular: Regular rate and rhythm Lungs: Regular rate and effort Abdomen:  Soft,  nontender, nondistended, no abdominal masses Back: No CVA tenderness Extremities: No edema Neurologic: Grossly intact GU: Pure wick device on-urine in wall canister clear.  There is right scrotal swelling.  There is edema of the right scrotum but minimal cellulitis.  Testicle is swollen and indurated consistent with orchitis.  No fluctuance.  Right testicle is tender to palpation. Left testicle smaller and normal to palpation.   Laboratory Data:  Results for orders placed or performed during the hospital encounter of 05/06/22 (from the past 24 hour(s))  POC CBG, ED     Status: Abnormal   Collection Time: 05/06/22  1:25 PM  Result Value Ref Range   Glucose-Capillary 150 (H) 70 - 99 mg/dL  Phosphorus     Status: Abnormal   Collection Time: 05/06/22  2:46 PM  Result Value Ref Range   Phosphorus 1.6 (L) 2.5 - 4.6 mg/dL  Culture, blood (Routine X 2) w Reflex to ID Panel     Status: None (Preliminary result)   Collection Time: 05/06/22  2:48 PM   Specimen: BLOOD LEFT ARM  Result Value Ref Range   Specimen Description BLOOD LEFT ARM BOTTLES DRAWN AEROBIC AND ANAEROBIC    Special Requests Blood Culture adequate volume    Culture      NO GROWTH < 24 HOURS Performed at [facility], [address] [place], [place] [address]    Report Status PENDING   Culture, blood (Routine X 2) w Reflex to ID Panel     Status: None (Preliminary result)   Collection Time: 05/06/22  2:53 PM   Specimen: BLOOD RIGHT ARM  Result Value Ref Range   Specimen Description      BLOOD RIGHT ARM BOTTLES DRAWN AEROBIC AND ANAEROBIC   Special Requests Blood Culture adequate volume    Culture      NO GROWTH < 24 HOURS Performed at [facility], [address], [place], [place] [address]    Report Status PENDING   Hemoglobin A1c     Status: Abnormal   Collection Time: 05/06/22  3:23 PM  Result Value Ref Range   Hgb A1c MFr Bld 6.9 (H) 4.8 - 5.6 %   Mean Plasma Glucose 151.33 mg/dL  Glucose, capillary      Status: Abnormal   Collection Time: 05/06/22  5:05 PM  Result Value Ref Range   Glucose-Capillary 179 (H) 70 - 99 mg/dL  Glucose, capillary     Status: Abnormal   Collection Time: 05/06/22  9:20 PM  Result Value Ref Range   Glucose-Capillary 180 (H) 70 - 99 mg/dL   Comment 1 Notify RN    Comment 2 Document in Chart   Basic metabolic panel     Status: Abnormal   Collection Time: 05/07/22  4:33 AM  Result Value Ref Range   Sodium 134 (L) 135 - 145 mmol/L   Potassium 3.6 3.5 - 5.1 mmol/L   Chloride 101 98 - 111 mmol/L   CO2 24 22 - 32 mmol/L   Glucose, Bld 144 (H) 70 - 99 mg/dL   BUN 19 8 - 23 mg/dL   Creatinine, Ser 0.99 0.61 - 1.24 mg/dL   Calcium 7.7 (L) 8.9 - 10.3 mg/dL   GFR, Estimated >60 >60 mL/min   Anion gap 9 5 - 15  CBC     Status: Abnormal   Collection Time: 05/07/22  4:33 AM  Result Value Ref Range   WBC 25.9 (H) 4.0 - 10.5 K/uL   RBC 3.56 (L) 4.22 -  5.81 MIL/uL   Hemoglobin 9.6 (L) 13.0 - 17.0 g/dL   HCT 31.6 (L) 39.0 - 52.0 %   MCV 88.8 80.0 - 100.0 fL   MCH 27.0 26.0 - 34.0 pg   MCHC 30.4 30.0 - 36.0 g/dL   RDW 15.9 (H) 11.5 - 15.5 %   Platelets 179 150 - 400 K/uL   nRBC 0.0 0.0 - 0.2 %  Protime-INR     Status: Abnormal   Collection Time: 05/07/22  4:33 AM  Result Value Ref Range   Prothrombin Time 30.0 (H) 11.4 - 15.2 seconds   INR 2.9 (H) 0.8 - 1.2  APTT     Status: Abnormal   Collection Time: 05/07/22  4:33 AM  Result Value Ref Range   aPTT 45 (H) 24 - 36 seconds  Glucose, capillary     Status: Abnormal   Collection Time: 05/07/22  8:08 AM  Result Value Ref Range   Glucose-Capillary 149 (H) 70 - 99 mg/dL   Comment 1 Notify RN    Comment 2 Document in Chart    Recent Results (from the past 240 hour(s))  Culture, blood (Routine X 2) w Reflex to ID Panel     Status: None (Preliminary result)   Collection Time: 05/06/22  2:48 PM   Specimen: BLOOD LEFT ARM  Result Value Ref Range Status   Specimen Description BLOOD LEFT ARM BOTTLES DRAWN AEROBIC AND  ANAEROBIC  Final   Special Requests Blood Culture adequate volume  Final   Culture   Final    NO GROWTH < 24 HOURS Performed at [facility], [address], [place], [place] [address]    Report Status PENDING  Incomplete  Culture, blood (Routine X 2) w Reflex to ID Panel     Status: None (Preliminary result)   Collection Time: 05/06/22  2:53 PM   Specimen: BLOOD RIGHT ARM  Result Value Ref Range Status   Specimen Description   Final    BLOOD RIGHT ARM BOTTLES DRAWN AEROBIC AND ANAEROBIC   Special Requests Blood Culture adequate volume  Final   Culture   Final    NO GROWTH < 24 HOURS Performed at [facility], [address], [place],  [address]    Report Status PENDING  Incomplete   Creatinine: Recent Labs    05/06/22 1047 05/07/22 0433  CREATININE 1.08 0.99    Impression/Assessment/plan:  Right epididymoorchitis-continue Levofloxacin. Be sure to provide coverage for Enterococcus and gram-negative and if any question check with ID. Discussed with patient that the testicle swelling can persist for weeks.  Hopefully his pain will improve and agree with multimodality approach with NSAIDs and opiates.   Urgency and urge incontinence-he is voiding satisfactorily into a pure wick.  If any painful inability to urinate check a bladder scan and consider Foley catheter replacement.  Festus Aloe 05/07/2022, 12:20 PM

## 2022-05-08 ENCOUNTER — Ambulatory Visit: Payer: Medicare HMO

## 2022-05-08 DIAGNOSIS — N453 Epididymo-orchitis: Secondary | ICD-10-CM | POA: Diagnosis not present

## 2022-05-08 LAB — GLUCOSE, CAPILLARY
Glucose-Capillary: 99 mg/dL (ref 70–99)
Glucose-Capillary: 183 mg/dL — ABNORMAL HIGH (ref 70–99)
Glucose-Capillary: 161 mg/dL — ABNORMAL HIGH (ref 70–99)

## 2022-05-08 LAB — CBC
HCT: 30 % — ABNORMAL LOW (ref 39.0–52.0)
Hemoglobin: 9.1 g/dL — ABNORMAL LOW (ref 13.0–17.0)
MCH: 27.1 pg (ref 26.0–34.0)
MCHC: 30.3 g/dL (ref 30.0–36.0)
MCV: 89.3 fL (ref 80.0–100.0)
Platelets: 153 10*3/uL (ref 150–400)
RBC: 3.36 MIL/uL — ABNORMAL LOW (ref 4.22–5.81)
RDW: 15.8 % — ABNORMAL HIGH (ref 11.5–15.5)
WBC: 17.9 10*3/uL — ABNORMAL HIGH (ref 4.0–10.5)
nRBC: 0 % (ref 0.0–0.2)

## 2022-05-08 LAB — BASIC METABOLIC PANEL
Anion gap: 8 (ref 5–15)
BUN: 25 mg/dL — ABNORMAL HIGH (ref 8–23)
CO2: 24 mmol/L (ref 22–32)
Calcium: 7.3 mg/dL — ABNORMAL LOW (ref 8.9–10.3)
Chloride: 102 mmol/L (ref 98–111)
Creatinine, Ser: 1.26 mg/dL — ABNORMAL HIGH (ref 0.61–1.24)
GFR, Estimated: 58 mL/min — ABNORMAL LOW (ref >60)
Glucose, Bld: 126 mg/dL — ABNORMAL HIGH (ref 70–99)
Potassium: 3.5 mmol/L (ref 3.5–5.1)
Sodium: 134 mmol/L — ABNORMAL LOW (ref 135–145)

## 2022-05-08 LAB — PROTIME-INR
INR: 3.3 — ABNORMAL HIGH (ref 0.8–1.2)
Prothrombin Time: 33.5 seconds — ABNORMAL HIGH (ref 11.4–15.2)

## 2022-05-08 NOTE — Progress Notes (Signed)
Physical Therapy Treatment Patient Details Name: Troy [name] MRN: [id number] DOB: [birth year] Today's Date: 05/08/2022   History of Present Illness Troy [name] is a 82 year old male with extensive history of HLD, CABG, CAD, DM 2, HTN, A-fib (on Coumadin), chronic anemia, epididymoorchitis .[name]... Presented with chief complaint of right testicular pain.     Patient reports that he has had some urinary transient and epididymoorchitis in August 2023 was seen and treated by urologist Dr. [name].     Apparently he has visited Dr. [name] in past week for urinary retention Foley catheter was placed on January 25th, currently was follow-up for catheter obstruction which cleared by flushing.  He was seen in office again 5 days ago for catheter removal, and post void residual on May 01, 2022.  Urine culture from January 25 has been negative.... Patient reports that he has been able to drain his bladder, has some incontinency.  He is bearing briefs..  Noted he was passing blood clots last week which has resolved.     Today he is reporting of significant right testicular pain started last night.  Tylenol did not help.  This morning was quite worse was associate with nausea vomiting but no fever    PT Comments    Patient tolerated today's session well with focus on progressive OOB mobility with increasing activity tolerance.  Patient continues to be severely limited and independent mobility due to R testicular pain.  Patient increasing distance of total ambulation with RW, but unsafe to be left alone during functional mobility as patient noted intermittent LOB anterior/posterior due to pain.  Patient will continue to benefit from skilled physical therapy services to address functional mobility, goals and safe/appropriate DC to least restrictive environment.  Recommendations for follow up therapy are one component of a multi-disciplinary discharge planning process, led by the attending physician.   Recommendations may be updated based on patient status, additional functional criteria and insurance authorization.  Follow Up Recommendations  Skilled nursing-short term rehab (<3 hours/day) Can patient physically be transported by private vehicle: No   Assistance Recommended at Discharge Set up Supervision/Assistance  Patient can return home with the following A lot of help with bathing/dressing/bathroom;A lot of help with walking and/or transfers;Help with stairs or ramp for entrance;Assistance with cooking/housework   Equipment Recommendations  None recommended by PT    Recommendations for Other Services       Precautions / Restrictions Precautions Precautions: Fall Restrictions Weight Bearing Restrictions: No     Mobility  Bed Mobility Overal bed mobility: Needs Assistance Bed Mobility: Supine to Sit     Supine to sit: HOB elevated, Supervision     General bed mobility comments: Increased time, use of bed rails; labored movement.    Transfers Overall transfer level: Needs assistance Equipment used: Rolling walker (2 wheels) Transfers: Sit to/from Stand, Bed to chair/wheelchair/BSC Sit to Stand: Min guard   Step pivot transfers: Min guard       General transfer comment: Labored movement but without need for physical assist to boost from EOB or chair. Pt given continunous verbal cuing for RW management.    Ambulation/Gait Ambulation/Gait assistance: Supervision, Min guard Gait Distance (Feet): 90 Feet Assistive device: Rolling walker (2 wheels) Gait Pattern/deviations: Decreased step length - right, Decreased step length - left, Decreased stride length Gait velocity: slow     General Gait Details: limited to a few slow labored side steps due to fatigue, generalized weakness and c/o severe right testicle pain. Shuffle like gait  pattern with minor LOB noted secondary due to R tetsicular pain.   Stairs             Wheelchair Mobility    Modified  Rankin (Stroke Patients Only)       Balance Overall balance assessment: Needs assistance Sitting-balance support: Feet supported, No upper extremity supported Sitting balance-Leahy Scale: Fair Sitting balance - Comments: fair to good seated at EOB   Standing balance support: During functional activity, Bilateral upper extremity supported Standing balance-Leahy Scale: Fair Standing balance comment: fair using RW; minor LOB no assistance to correct, increased anterior/posterior sway due to R testicular pain.                            Cognition Arousal/Alertness: Awake/alert Behavior During Therapy: WFL for tasks assessed/performed Overall Cognitive Status: Within Functional Limits for tasks assessed                                          Exercises General Exercises - Lower Extremity Long Arc Quad: AROM, 10 reps, Both    General Comments        Pertinent Vitals/Pain Pain Assessment Pain Assessment: Faces Faces Pain Scale: Hurts even more Pain Location: right testicle Pain Descriptors / Indicators: Grimacing, Guarding Pain Intervention(s): Limited activity within patient's tolerance, Monitored during session    Home Living Family/patient expects to be discharged to:: Private residence Living Arrangements: Alone Available Help at Discharge: Family;Available PRN/intermittently;Personal care attendant Type of Home: House Home Access: Ramped entrance       Home Layout: One level Home Equipment: Conservation officer, nature (2 wheels);Wheelchair - manual;Shower seat - built in;Shower seat;Grab bars - tub/shower;BSC/3in1 Additional Comments: Personal care attendant is present 2.5 hours a day during the week.    Prior Function            PT Goals (current goals can now be found in the care plan section) Acute Rehab PT Goals Patient Stated Goal: return home with family to assist PT Goal Formulation: With patient Time For Goal Achievement:  05/21/22 Potential to Achieve Goals: Good Progress towards PT goals: Progressing toward goals    Frequency    Min 3X/week      PT Plan      Co-evaluation PT/OT/SLP Co-Evaluation/Treatment: Yes Reason for Co-Treatment: To address functional/ADL transfers PT goals addressed during session: Mobility/safety with mobility;Proper use of DME OT goals addressed during session: ADL's and self-care      AM-PAC PT "6 Clicks" Mobility   Outcome Measure  Help needed turning from your back to your side while in a flat bed without using bedrails?: A Lot Help needed moving from lying on your back to sitting on the side of a flat bed without using bedrails?: A Little Help needed moving to and from a bed to a chair (including a wheelchair)?: A Little Help needed standing up from a chair using your arms (e.g., wheelchair or bedside chair)?: A Little Help needed to walk in hospital room?: A Little Help needed climbing 3-5 steps with a railing? : Total 6 Click Score: 15    End of Session Equipment Utilized During Treatment: Gait belt Activity Tolerance: Patient tolerated treatment well;Patient limited by fatigue;Patient limited by pain Patient left: in chair;with call bell/phone within reach Nurse Communication: Mobility status PT Visit Diagnosis: Unsteadiness on feet (R26.81);Other abnormalities of gait and mobility (  R26.89);Muscle weakness (generalized) (M62.81)     Time: 3013-1438 PT Time Calculation (min) (ACUTE ONLY): 18 min  Charges:  $Gait Training: 8-22 mins                     Wonda Olds PT, DPT Physical Therapist with [place] [phone] office    Wonda Olds 05/08/2022, 10:30 AM

## 2022-05-08 NOTE — TOC Progression Note (Signed)
Transition of Care [facility]) - Progression Note    Patient Details  Name: Troy [name] MRN: [id number] Date of Birth: 05/20/1940  Transition of Care [facility]) CM/SW Contact  Joaquin Courts, RN Phone Number: 05/08/2022, 11:49 AM  Clinical Narrative:    HIPAA compliant VM left for [facility] csw to verify benefits at [phone] ext [phone].   Expected Discharge Plan: Startup Barriers to Discharge: Continued Medical Work up  Expected Discharge Plan and Services In-house Referral: Clinical Social Work   Post Acute Care Choice: [place] Living arrangements for the past 2 months: [place] Determinants of Health (SDOH) Interventions SDOH Screenings   Food Insecurity: No Food Insecurity (05/08/2022)  Housing: Low Risk  (05/08/2022)  Transportation Needs: No Transportation Needs (05/08/2022)  Utilities: Not At Risk (05/08/2022)  Tobacco Use: Medium Risk (05/06/2022)    Readmission Risk Interventions    05/07/2022   11:28 AM 11/07/2021   12:48 PM  Readmission Risk Prevention Plan  Transportation Screening Complete Complete  Home Care Screening  Complete  Medication Review (RN CM)  Complete  Medication Review Press photographer) Complete   HRI or Home Care Consult Complete   SW Recovery Care/Counseling Consult Complete   Palliative Care Screening Not Applicable   Skilled Nursing Facility Complete

## 2022-05-08 NOTE — TOC Progression Note (Signed)
Transition of Care [facility]) - Progression Note    Patient Details  Name: Troy [name] MRN: [id number] Date of Birth: 03-Aug-1940  Transition of Care [facility]) CM/SW [place], [place] Phone Number: 05/08/2022, 4:07 PM  Clinical Narrative:    Clinicals sent via secure email to [email or web address] due to fax not going through after multiple attempts. TOC to follow.   Expected Discharge Plan: [place] Barriers to Discharge: Continued Medical Work up  Expected Discharge Plan and Services In-house Referral: Clinical Social Work   Post Acute Care Choice: [place] Living arrangements for the past 2 months: [place] Determinants of Health (SDOH) Interventions SDOH Screenings   Food Insecurity: No Food Insecurity (05/08/2022)  Housing: Low Risk  (05/08/2022)  Transportation Needs: No Transportation Needs (05/08/2022)  Utilities: Not At Risk (05/08/2022)  Tobacco Use: Medium Risk (05/06/2022)    Readmission Risk Interventions    05/07/2022   11:28 AM 11/07/2021   12:48 PM  Readmission Risk Prevention Plan  Transportation Screening Complete Complete  Home Care Screening  Complete  Medication Review (RN CM)  Complete  Medication Review Press photographer) Complete   HRI or Home Care Consult Complete   SW Recovery Care/Counseling Consult Complete   Palliative Care Screening Not Applicable   Skilled Nursing Facility Complete

## 2022-05-08 NOTE — Progress Notes (Signed)
ANTICOAGULATION CONSULT NOTE -   Pharmacy Consult for Warfarin Indication: atrial fibrillation  Allergies  Allergen Reactions   Niacin Itching and Rash    Burning sensation   Contrast Media [Iodinated Contrast Media] Nausea And Vomiting   Iodine-131 Nausea And Vomiting   Nsaids Other (See Comments)    Taking Coumadin    Patient Measurements: Height: '5\' 10"'$  (177.8 cm) Weight: 94.4 kg (208 lb 1.8 oz) IBW/kg (Calculated) : 73  Vital Signs: Temp: 98.2 F (36.8 C) (02/06 0535) Temp Source: Oral (02/05 2044) BP: 108/52 (02/06 0535) Pulse Rate: 55 (02/06 0535)  Labs: Recent Labs    05/06/22 1047 05/07/22 0433 05/08/22 0414  HGB 11.7* 9.6* 9.1*  HCT 35.7* 31.6* 30.0*  PLT 194 179 153  APTT  --  45*  --   LABPROT 31.1* 30.0* 33.5*  INR 3.0* 2.9* 3.3*  CREATININE 1.08 0.99 1.26*     Estimated Creatinine Clearance: 54.9 mL/min (A) (by C-G formula based on SCr of 1.26 mg/dL (H)).   Medical History: Past Medical History:  Diagnosis Date   AAA (abdominal aortic aneurysm) ([place])    Followed by Dr. Sherren Mocha Early   Arthritis    CAD (coronary artery disease)    a. CABG 2000.   Cancer [facility])    Prostate:  Radiation Tx   Carotid artery disease ([place])    Chest pain    precordial. mild chronic .[name]... nonischemic   CHF (congestive heart failure) (HCC)    Chronic edema    Coronary artery disease    a. Nuclear, January, 2008, no ischemia b. Cath 08/2012- 1/4 patent grafts, RCA CTO, no flow-limiting disease, medically managed   Diabetes mellitus without complication ([place])    Dizziness 02/2011   Dyslipidemia    Fall    GERD (gastroesophageal reflux disease)    TAKES TUMS & ROLAIDS AS NEEDED   Heart murmur    Hx of CABG 2000   Hypertension    Myocardial infarction ([place]) 1999   Neck pain 02/2011   Pneumonia    Pulmonary hypertension ([place])    Renal artery stenosis (HCC)    50-70%   S/P femoropopliteal bypass surgery    Dr. Donnetta Hutching   Sinus bradycardia    Asymptomatic     Assessment: Presented with chief complaint of right testicular pain. He has had urinary issues recently  and was passing blood clots. He is chronically anticoagulated for afib with warfarin. Patient told me he tries to keep INR around 2.5-3. INR today is 3. Likely with antibiotics starting for presumed orchitis/epipidiymitis would anticipate INR increasing so may need to adjust dose. Pharmacy asked to dose Warfarin.   INR up to 3.3 this morning which is above goal. Hemoglobin continues to trend down to 9.1. No bleeding issues noted. Given INR trend will hold warfarin today.   Home dose is '2mg'$  daily  Goal of Therapy:  INR 2-3 Monitor platelets by anticoagulation protocol: Yes   Plan:  Hold warfarin today PT-INR daily Monitor for S/S of bleeding  Erin Hearing PharmD., BCPS Clinical Pharmacist 05/08/2022 7:58 AM

## 2022-05-08 NOTE — Plan of Care (Signed)
  Problem: Acute Rehab OT Goals (only OT should resolve) Goal: Pt. Will Perform Grooming Flowsheets (Taken 05/08/2022 0921) Pt Will Perform Grooming:  with modified independence  standing Goal: Pt. Will Perform Lower Body Bathing Flowsheets (Taken 05/08/2022 0921) Pt Will Perform Lower Body Bathing:  with modified independence  sitting/lateral leans Goal: Pt. Will Perform Lower Body Dressing Flowsheets (Taken 05/08/2022 0921) Pt Will Perform Lower Body Dressing:  with modified independence  sitting/lateral leans Goal: Pt. Will Transfer To Toilet Flowsheets (Taken 05/08/2022 [phone]) Pt Will Transfer to Toilet:  with modified independence  ambulating Goal: Pt. Will Perform Toileting-Clothing Manipulation Flowsheets (Taken 05/08/2022 0921) Pt Will Perform Toileting - Clothing Manipulation and hygiene:  with modified independence  sitting/lateral leans  Ghalia Reicks OT, MOT

## 2022-05-08 NOTE — Progress Notes (Signed)
PROGRESS NOTE    Patient: Troy [name]                            PCP: Sharilyn Sites, MD                    DOB: 1941-01-05            DOA: 05/06/2022 [id number]             DOS: 05/08/2022, 10:06 AM   LOS: 2 days   Date of Service: The patient was seen and examined on 05/08/2022  Subjective:   The patient was seen and examined this morning, stable no acute distress reporting mild improvement in testicular pain.   Brief Narrative:   Troy [name] is a 82 year old male with extensive history of HLD, CABG, CAD, DM 2, HTN, A-fib (on Coumadin), chronic anemia, epididymoorchitis .[name]... Presented with chief complaint of right testicular pain.  Patient reports that he has had some urinary transient and epididymoorchitis in August 2023 was seen and treated by urologist Dr. [name].  Apparently he has visited Dr. [name] in past week for urinary retention Foley catheter was placed on January 25th, currently was follow-up for catheter obstruction which cleared by flushing.  He was seen in office again 5 days ago for catheter removal, and post void residual on May 01, 2022. Urine culture from January 25 has been negative.... Patient reports that he has been able to drain his bladder, has some incontinency.  He is bearing briefs..  Noted he was passing blood clots last week which has resolved.  Today he is reporting of significant right testicular pain started last night.  Tylenol did not help.  This morning was quite worse was associate with nausea vomiting but no fever.   ED course: Blood pressure 132/61, pulse 76, temperature 98.4 F (36.9 C), resp. rate 20, SpO2 97 % On RA.  BMP within normal limits with exception of glucose of 201, calcium 8.8, magnesium 1.2, WBC 17.1, UA small hemoglobin, moderate leukocytosis, negative for any nitrites, >50 RBC, rare bacteria, WBC 11-20  Scrotal ultrasound: 4 mm peripheral right scrotal cyst possibly along the rete testis. New heterogeneity of  the right testis and mild right testicular hyperemia suggesting orchitis.   Requested patient to be admitted for IV antibiotics, pain management.     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 ----Pulmonary HTN    Debility     Assessment and Plan: * Orchitis and epididymitis -Continuing IV antibiotics, pain management -Afebrile, normotensive, WBC 25.9 >> 17.9 -Continuing with IV antibiotics of Unasyn -Pending cultures urine and blood   -Gentle IV fluid hydration.Marland KitchenMarland KitchenD/Ced    Testicular ultrasound: MPRESSION: 1. New heterogeneity of the right testis and mild right testicular hyperemia suggesting orchitis. 2. No evidence of testicular torsion.  -Follow-up with urine cultures -Consulted urologist --- appreciate further evaluation recommendations His primary urologist Dr. [name]    Acute urinary obstruction -History of urinary retention, - Voiding now - no signs of retention,  -Monitoring closely, in and out as needed,   Chronic heart failure with preserved ejection fraction (HFpEF) ([place]) -Will  continue home medications including losartan,  Aldactone -Demadex on hold will restart in a.m. -Currently patient needs gentle IV hydration for leukocytosis, orchitis infection  Atrial fibrillation (HCC) -Stable continuing rate control medications including Toprol-XL, and Coumadin -INR therapeutic  Type 2 diabetes mellitus (HCC) -Currently on metformin, will hold for now -Checking CBG before every meal/with SSI coverage -Last A1c October 2023 6.9 -Rechecking A1c 6.9  --Severe pulmonary HTN, 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 ----Pulmonary HTN  -stable  -History of pulmonary  hypertension -Monitoring closely  CORONARY ARTERY BYPASS GRAFT, HX OF - Continue current medication including: Imdur, statins, losartan, Toprol, Coumadin  Hyperlipidemia -Continue statins  Debility -PT/OT consulted-status post evaluation-recommending SNF -She lives alone continues to decline--- agreeable to SNF -TOC consulted-patient is a [place] patient will assist with placement   ------------------------------------------------------------------------------------------------------------------------- Nutritional status:  The patient's BMI is: Body mass index is 29.86 kg/m. I agree with the assessment and plan as outlined e:   ----------------------------------------------------------------------------------------------------------------------- Cultures; Urine Culture  >>> No Growth  Blood cultures >> NGTD  -----------------------------------------------------------------------------------------------------------------------  DVT prophylaxis:  TED hose Start: 05/06/22 1446 SCDs Start: 05/06/22 1446   Code Status:   Code Status: Full Code  Family Communication: No family member present at bedside- attempt will be made to update daily The above findings and plan of care has been discussed with patient (and family)  in detail,  they expressed understanding and agreement of above. -Advance care planning has been discussed.   Admission status:   Status is: Inpatient Remains inpatient appropriate because: Needing IV antibiotics, pain management   Disposition: From  - home             Planning for discharge in 1-2 days: to  SNF   Procedures:   No admission procedures for hospital encounter.   Antimicrobials:  Anti-infectives (From admission, onward)    Start     Dose/Rate Route Frequency Ordered Stop   05/06/22 2200  Ampicillin-Sulbactam (UNASYN) 3 g in sodium chloride 0.9 % 100 mL IVPB  Status:  Discontinued        3 g 200 mL/hr over 30 Minutes Intravenous Every 8  hours 05/06/22 1531 05/06/22 1552   05/06/22 2200  Ampicillin-Sulbactam (UNASYN) 3 g in sodium chloride 0.9 % 100 mL IVPB        3 g 200 mL/hr over 30 Minutes Intravenous Every 6 hours 05/06/22 1552     05/06/22 1500  cefTRIAXone (ROCEPHIN) injection 1 g  Status:  Discontinued        1 g Intramuscular Every 24 hours 05/06/22 1448 05/06/22 1521   05/06/22 1500  doxycycline (VIBRAMYCIN) capsule 100 mg  Status:  Discontinued        100 mg Oral Every 12 hours 05/06/22 1452 05/06/22 1521   05/06/22 1245  levofloxacin (LEVAQUIN) IVPB 500 mg        500 mg 100 mL/hr over 60 Minutes Intravenous  Once 05/06/22 1238 05/06/22 1435        Medication:   acidophilus  2 capsule Oral TID   atorvastatin  40 mg Oral Daily   dutasteride  0.5 mg Oral Daily   ibuprofen  600 mg Oral TID   insulin aspart  0-9 Units Subcutaneous TID WC   isosorbide mononitrate  120 mg Oral QPM   latanoprost  1 drop Both Eyes QHS   losartan  25 mg Oral Daily   metoprolol succinate  12.5 mg Oral Daily   pantoprazole  40 mg Oral Daily   sodium chloride flush  3 mL Intravenous Q12H   sodium chloride flush  3 mL Intravenous Q12H   tamsulosin  0.4 mg Oral QPC supper   torsemide  20 mg Oral Daily   Warfarin - Pharmacist Dosing Inpatient   Does not apply q1600    sodium chloride, acetaminophen **OR** acetaminophen, alum & mag hydroxide-simeth, bisacodyl, hydrALAZINE, HYDROcodone-acetaminophen, HYDROmorphone (DILAUDID) injection, ipratropium, levalbuterol, nitroGLYCERIN, ondansetron **OR** ondansetron (ZOFRAN) IV, senna-docusate, sodium chloride flush, sodium phosphate, traZODone   Objective:   Vitals:   05/07/22 1556 05/07/22 2044 05/08/22 0429 05/08/22 0535  BP: (!) 105/53 (!) 96/49  (!) 108/52  Pulse: 69 61  (!) 55  Resp: (!) '21 18  18  '$ Temp: 97.8 F (36.6 C) 97.6 F (36.4 C)  98.2 F (36.8 C)  TempSrc:  Oral    SpO2: 91% 90%  99%  Weight:   94.4 kg   Height:        Intake/Output Summary (Last 24 hours) at  05/08/2022 1006 Last data filed at 05/08/2022 0500 Gross per 24 hour  Intake 3099.88 ml  Output 1000 ml  Net 2099.88 ml   Filed Weights   05/06/22 1810 05/07/22 0500 05/08/22 0429  Weight: 95.7 kg 95.1 kg 94.4 kg     Physical examination:   General:  AAO x 3,  cooperative, no distress;   HEENT:  Normocephalic, PERRL, otherwise with in Normal limits   Neuro:  CNII-XII intact. , normal motor and sensation, reflexes intact   Lungs:   Clear to auscultation BL, Respirations unlabored,  No wheezes / crackles  Cardio:    S1/S2, RRR, No murmure, No Rubs or Gallops   Abdomen:  Soft, non-tender, bowel sounds active all four quadrants, no guarding or peritoneal signs.  Muscular  skeletal:  Limited exam -global generalized weaknesses - in bed, able to move all 4 extremities,   2+ pulses,  symmetric, No pitting edema  Skin:  Dry, warm to touch, negative for any Rashes,  Wounds: Please see nursing documentation Minimum erythema edema but excessive tenderness in the testicle area - some improvement    ------------------------------------------------------------------------------------------------------------------------------------------    LABs:     Latest Ref Rng & Units 05/08/2022    4:14 AM 05/07/2022    4:33 AM 05/06/2022   10:47 AM  CBC  WBC 4.0 - 10.5 K/uL 17.9  25.9  17.1   Hemoglobin 13.0 - 17.0 g/dL 9.1  9.6  11.7   Hematocrit 39.0 - 52.0 % 30.0  31.6  35.7   Platelets 150 - 400 K/uL 153  179  194       Latest Ref Rng & Units 05/08/2022    4:14 AM 05/07/2022    4:33 AM 05/06/2022   10:47 AM  CMP  Glucose 70 - 99 mg/dL 126  144  201   BUN 8 - 23 mg/dL '25  19  22   '$ Creatinine 0.61 - 1.24 mg/dL 1.26  0.99  1.08   Sodium 135 - 145 mmol/L 134  134  137   Potassium 3.5 - 5.1 mmol/L 3.5  3.6  3.8   Chloride 98 - 111 mmol/L 102  101  101   CO2 22 - 32 mmol/L '24  24  25   '$ Calcium 8.9 - 10.3 mg/dL 7.3  7.7  8.8   Total Protein 6.5 - 8.1 g/dL   7.2   Total Bilirubin 0.3 - 1.2 mg/dL    0.8   Alkaline Phos 38 - 126 U/L   75   AST  15 - 41 U/L   29   ALT 0 - 44 U/L   11        Micro Results Recent Results (from the past 240 hour(s))  Culture, blood (Routine X 2) w Reflex to ID Panel     Status: None (Preliminary result)   Collection Time: 05/06/22  2:48 PM   Specimen: BLOOD LEFT ARM  Result Value Ref Range Status   Specimen Description BLOOD LEFT ARM BOTTLES DRAWN AEROBIC AND ANAEROBIC  Final   Special Requests Blood Culture adequate volume  Final   Culture   Final    NO GROWTH 2 DAYS Performed at [facility], [address], [place], [place] [address]    Report Status PENDING  Incomplete  Culture, blood (Routine X 2) w Reflex to ID Panel     Status: None (Preliminary result)   Collection Time: 05/06/22  2:53 PM   Specimen: BLOOD RIGHT ARM  Result Value Ref Range Status   Specimen Description   Final    BLOOD RIGHT ARM BOTTLES DRAWN AEROBIC AND ANAEROBIC   Special Requests Blood Culture adequate volume  Final   Culture   Final    NO GROWTH 2 DAYS Performed at [facility], [address], [place], [place] [address]    Report Status PENDING  Incomplete    Radiology Reports No results found.  SIGNED: Deatra James, MD, FHM. FAAFP. Zacarias Pontes - [facility] Time spent > 35 min.  In seeing, evaluating and examining the patient. Reviewing medical records, labs, drawn plan of care. [facility],  Pager (please use [email or web address] to page/ text) Please use Epic Secure Chat for non-urgent communication (7AM-7PM)  If 7PM-7AM, please contact night-coverage [email or web address], 05/08/2022, 10:06 AM

## 2022-05-08 NOTE — TOC Progression Note (Signed)
Transition of Care [facility]) - Progression Note    Patient Details  Name: Troy [name] MRN: [id number] Date of Birth: Jul 02, 1940  Transition of [facility]) CM/SW Contact  Joaquin Courts, RN Phone Number: 05/08/2022, 1:27 PM  Clinical Narrative:    CM received call from [place] CSW who reports that the community living center/CLC will need to screen patient to determine if eligible for SNF.  CM explained that documents have already been submitted for this, per [place] CSW she does not see on her end where the screening has been complete yet, but advises that TOC will receive a call directly from [facility] on the determination and could not provide this CM with a contact number.   Expected Discharge Plan: [place] Barriers to Discharge: Continued Medical Work up  Expected Discharge Plan and Services In-house Referral: Clinical Social Work   Post Acute Care Choice: [place] Living arrangements for the past 2 months: [place] Determinants of Health (SDOH) Interventions SDOH Screenings   Food Insecurity: No Food Insecurity (05/08/2022)  Housing: Low Risk  (05/08/2022)  Transportation Needs: No Transportation Needs (05/08/2022)  Utilities: Not At Risk (05/08/2022)  Tobacco Use: Medium Risk (05/06/2022)    Readmission Risk Interventions    05/07/2022   11:28 AM 11/07/2021   12:48 PM  Readmission Risk Prevention Plan  Transportation Screening Complete Complete  Home Care Screening  Complete  Medication Review (RN CM)  Complete  Medication Review Press photographer) Complete   HRI or Home Care Consult Complete   SW Recovery Care/Counseling Consult Complete   Palliative Care Screening Not Applicable   Skilled Nursing Facility Complete

## 2022-05-08 NOTE — Evaluation (Addendum)
Occupational Therapy Evaluation Patient Details Name: Troy [name] MRN: [id number] DOB: 03/04/1941 Today's Date: 05/08/2022   History of Present Illness Troy [name] is a 82 year old male with extensive history of HLD, CABG, CAD, DM 2, HTN, A-fib (on Coumadin), chronic anemia, epididymoorchitis .[name]... Presented with chief complaint of right testicular pain.     Patient reports that he has had some urinary transient and epididymoorchitis in August 2023 was seen and treated by urologist Dr. [name].     Apparently he has visited Dr. [name] in past week for urinary retention Foley catheter was placed on January 25th, currently was follow-up for catheter obstruction which cleared by flushing.  He was seen in office again 5 days ago for catheter removal, and post void residual on May 01, 2022.  Urine culture from January 25 has been negative.... Patient reports that he has been able to drain his bladder, has some incontinency.  He is bearing briefs..  Noted he was passing blood clots last week which has resolved.     Today he is reporting of significant right testicular pain started last night.  Tylenol did not help.  This morning was quite worse was associate with nausea vomiting but no fever   Clinical Impression   Pt agreeable to OT and PT co-evaluation/treatment. Pt reports having less pain than yesterday. Pt demonstrates slow labored movement but without need for physical assist for bed mobility and functional transfers. Pt is assisted PRN for ADL's and typically assisted to don socks. B UE strength is good and within functional limits. Pt noted to desaturate to mid 80s for SpO2 percentage during ambulation, but without significant signs of shortness of breath. Pt left in chair with SpO2 above 90%. Pt left in chair with call bell within reach. Pt will benefit from continued OT in the hospital and recommended venue below to increase strength, balance, and endurance for safe ADL's.         Recommendations for follow up therapy are one component of a multi-disciplinary discharge planning process, led by the attending physician.  Recommendations may be updated based on patient status, additional functional criteria and insurance authorization.   Follow Up Recommendations  Skilled nursing-short term rehab (<3 hours/day)     Assistance Recommended at Discharge Intermittent Supervision/Assistance  Patient can return home with the following A little help with walking and/or transfers;A lot of help with bathing/dressing/bathroom;Assistance with cooking/housework    Functional Status Assessment  Patient has had a recent decline in their functional status and demonstrates the ability to make significant improvements in function in a reasonable and predictable amount of time.  Equipment Recommendations  None recommended by OT           Precautions / Restrictions Precautions Precautions: Fall Restrictions Weight Bearing Restrictions: No      Mobility Bed Mobility Overal bed mobility: Needs Assistance Bed Mobility: Supine to Sit     Supine to sit: HOB elevated, Supervision     General bed mobility comments: Increased time, use of bed rails; labored movement.    Transfers Overall transfer level: Needs assistance Equipment used: Rolling walker (2 wheels) Transfers: Sit to/from Stand, Bed to chair/wheelchair/BSC Sit to Stand: Min guard     Step pivot transfers: Min guard     General transfer comment: Labored movement but without need for physical assist to boost from EOB or chair.      Balance Overall balance assessment: Needs assistance Sitting-balance support: Feet supported, No upper extremity supported Sitting balance-Leahy Scale: Fair  Sitting balance - Comments: fair to good seated at EOB   Standing balance support: During functional activity, Bilateral upper extremity supported Standing balance-Leahy Scale: Fair Standing balance comment: fair using  RW                           ADL either performed or assessed with clinical judgement   ADL Overall ADL's : Needs assistance/impaired     Grooming: Standing;Min guard   Upper Body Bathing: Set up;Sitting   Lower Body Bathing: Moderate assistance;Maximal assistance;Sitting/lateral leans   Upper Body Dressing : Set up;Sitting   Lower Body Dressing: Moderate assistance;Maximal assistance;Sitting/lateral leans   Toilet Transfer: Min guard;Rolling walker (2 wheels) Toilet Transfer Details (indicate cue type and reason): Simulated via EOB to chair transfer.    Toilet hygiene: min G min A  with lateral leans.      Functional mobility during ADLs: Min guard;Rolling walker (2 wheels) General ADL Comments: Pt able to ambulate >50 feet in the hall  with RW.     Vision Baseline Vision/History: 1 Wears glasses Ability to See in Adequate Light: 0 Adequate Patient Visual Report: No change from baseline Vision Assessment?: No apparent visual deficits                Pertinent Vitals/Pain Pain Assessment Pain Assessment: Faces Faces Pain Scale: Hurts even more Pain Location: right testicle Pain Descriptors / Indicators: Grimacing, Guarding Pain Intervention(s): Limited activity within patient's tolerance, Monitored during session, Repositioned     Hand Dominance Right   Extremity/Trunk Assessment Upper Extremity Assessment Upper Extremity Assessment: Overall WFL for tasks assessed   Lower Extremity Assessment Lower Extremity Assessment: Defer to PT evaluation   Cervical / Trunk Assessment Cervical / Trunk Assessment: Normal   Communication Communication Communication: No difficulties   Cognition Arousal/Alertness: Awake/alert Behavior During Therapy: WFL for tasks assessed/performed Overall Cognitive Status: Within Functional Limits for tasks assessed                                                        Home Living Family/patient  expects to be discharged to:: Private residence Living Arrangements: Alone Available Help at Discharge: Family;Available PRN/intermittently;Personal care attendant Type of Home: House Home Access: Ramped entrance     Home Layout: One level     Bathroom Shower/Tub: Teacher, early years/pre: Handicapped height Bathroom Accessibility: Yes   Home Equipment: Conservation officer, nature (2 wheels);Wheelchair - manual;Shower seat - built in;Shower seat;Grab bars - tub/shower;BSC/3in1   Additional Comments: Personal care attendant is present 2.5 hours a day during the week.      Prior Functioning/Environment Prior Level of Function : Driving;Needs assist       Physical Assist : ADLs (physical)   ADLs (physical): IADLs;Dressing Mobility Comments: Household and  short distanced community ambulator using RW, drives ADLs Comments: PRN assist for ADL's. Assisted by care attendant for IADL's. Care attendant often puts socks on for pt.        OT Problem List: Decreased range of motion;Decreased activity tolerance;Decreased strength;Impaired balance (sitting and/or standing);Pain      OT Treatment/Interventions: Self-care/ADL training;Therapeutic exercise;Therapeutic activities;Patient/family education;Balance training    OT Goals(Current goals can be found in the care plan section) Acute Rehab OT Goals Patient Stated Goal: return home OT Goal Formulation: With  patient Time For Goal Achievement: 05/22/22 Potential to Achieve Goals: Good  OT Frequency: Min 2X/week    Co-evaluation PT/OT/SLP Co-Evaluation/Treatment: Yes Reason for Co-Treatment: To address functional/ADL transfers   OT goals addressed during session: ADL's and self-care                       End of Session Equipment Utilized During Treatment: Rolling walker (2 wheels);Gait belt  Activity Tolerance: Patient tolerated treatment well Patient left: in chair;with call bell/phone within reach  OT Visit  Diagnosis: Unsteadiness on feet (R26.81);Other abnormalities of gait and mobility (R26.89);Muscle weakness (generalized) (M62.81);Pain Pain - Right/Left: Right Pain - part of body:  (testicle)                Time: 2376-2831 OT Time Calculation (min): 20 min Charges:  OT General Charges $OT Visit: 1 Visit OT Evaluation $OT Eval Low Complexity: 1 Low  Zakariyya Helfman OT, MOT   Larey Seat 05/08/2022, 9:18 AM

## 2022-05-09 ENCOUNTER — Inpatient Hospital Stay (HOSPITAL_COMMUNITY): Payer: No Typology Code available for payment source

## 2022-05-09 DIAGNOSIS — N453 Epididymo-orchitis: Secondary | ICD-10-CM | POA: Diagnosis not present

## 2022-05-09 LAB — IRON AND TIBC
Iron: 15 ug/dL — ABNORMAL LOW (ref 45–182)
Saturation Ratios: 5 % — ABNORMAL LOW (ref 17.9–39.5)
TIBC: 287 ug/dL (ref 250–450)
UIBC: 272 ug/dL

## 2022-05-09 LAB — URINE CULTURE: Culture: >=100000 — AB

## 2022-05-09 LAB — BASIC METABOLIC PANEL
Anion gap: 10 (ref 5–15)
BUN: 32 mg/dL — ABNORMAL HIGH (ref 8–23)
CO2: 25 mmol/L (ref 22–32)
Calcium: 7.3 mg/dL — ABNORMAL LOW (ref 8.9–10.3)
Chloride: 102 mmol/L (ref 98–111)
Creatinine, Ser: 1.24 mg/dL (ref 0.61–1.24)
GFR, Estimated: 59 mL/min — ABNORMAL LOW (ref >60)
Glucose, Bld: 142 mg/dL — ABNORMAL HIGH (ref 70–99)
Potassium: 3.5 mmol/L (ref 3.5–5.1)
Sodium: 137 mmol/L (ref 135–145)

## 2022-05-09 LAB — CBC
HCT: 29.4 % — ABNORMAL LOW (ref 39.0–52.0)
Hemoglobin: 8.8 g/dL — ABNORMAL LOW (ref 13.0–17.0)
MCH: 26.5 pg (ref 26.0–34.0)
MCHC: 29.9 g/dL — ABNORMAL LOW (ref 30.0–36.0)
MCV: 88.6 fL (ref 80.0–100.0)
Platelets: 160 10*3/uL (ref 150–400)
RBC: 3.32 MIL/uL — ABNORMAL LOW (ref 4.22–5.81)
RDW: 16.1 % — ABNORMAL HIGH (ref 11.5–15.5)
WBC: 10.4 10*3/uL (ref 4.0–10.5)
nRBC: 0 % (ref 0.0–0.2)

## 2022-05-09 LAB — GLUCOSE, CAPILLARY
Glucose-Capillary: 121 mg/dL — ABNORMAL HIGH (ref 70–99)
Glucose-Capillary: 138 mg/dL — ABNORMAL HIGH (ref 70–99)
Glucose-Capillary: 149 mg/dL — ABNORMAL HIGH (ref 70–99)
Glucose-Capillary: 159 mg/dL — ABNORMAL HIGH (ref 70–99)

## 2022-05-09 LAB — PROTIME-INR
INR: 3.6 — ABNORMAL HIGH (ref 0.8–1.2)
Prothrombin Time: 35.3 seconds — ABNORMAL HIGH (ref 11.4–15.2)

## 2022-05-09 LAB — RETICULOCYTES
Immature Retic Fract: 26.1 % — ABNORMAL HIGH (ref 2.3–15.9)
RBC.: 3.33 MIL/uL — ABNORMAL LOW (ref 4.22–5.81)
Retic Count, Absolute: 47.6 10*3/uL (ref 19.0–186.0)
Retic Ct Pct: 1.4 % (ref 0.4–3.1)

## 2022-05-09 LAB — FOLATE: Folate: 16.2 ng/mL (ref >5.9)

## 2022-05-09 LAB — VITAMIN B12: Vitamin B-12: 210 pg/mL (ref 180–914)

## 2022-05-09 LAB — FERRITIN: Ferritin: 93 ng/mL (ref 24–336)

## 2022-05-09 MED ORDER — SODIUM CHLORIDE 0.9 % IV SOLN
2.0000 g | Freq: Three times a day (TID) | INTRAVENOUS | Status: DC
  Administered 2022-05-09 – 2022-05-11 (×7): 2 g via INTRAVENOUS
  Filled 2022-05-09 (×7): qty 12.5

## 2022-05-09 MED ORDER — SODIUM CHLORIDE 0.9 % IV SOLN
250.0000 mg | Freq: Every day | INTRAVENOUS | Status: AC
  Administered 2022-05-09 – 2022-05-10 (×2): 250 mg via INTRAVENOUS
  Filled 2022-05-09 (×2): qty 20

## 2022-05-09 MED ORDER — PHYTONADIONE 5 MG PO TABS
2.5000 mg | ORAL_TABLET | Freq: Once | ORAL | Status: AC
  Administered 2022-05-09: 2.5 mg via ORAL
  Filled 2022-05-09: qty 1

## 2022-05-09 MED ORDER — FLEET ENEMA 7-19 GM/118ML RE ENEM
1.0000 | ENEMA | Freq: Once | RECTAL | Status: AC
  Administered 2022-05-09: 1 via RECTAL

## 2022-05-09 NOTE — Progress Notes (Signed)
S: I was reconsulted on [place].  His urine culture grew Pseudomonas and his antibiotics are being adjusted appropriately.  CT scan of the abdomen and pelvis was wisely obtained to rule out a prostate abscess which did not show an abscess.  There was no air in the tissues.  There was simple penoscrotal edema.  The CT was read as "marked inflammations" and "worried about early Fournier's gangrene". This appropriately concerned Dr. Manuella Ghazi who reached out.  Fortunately, clinically the patient is doing well.  His testicle pain is improved.  His white count is normalized.  He had no fever. He has gross hematuria on and off since the Foley was removed but no clots.  He had no clots this week.  He had no trouble voiding.  He has had no painful inability to void.  His hemoglobin is slightly down to 8.8 from 9.1. CT with no clot in bladder.   O: Vitals:   05/09/22 0414 05/09/22 1454  BP: (!) 109/57 (!) 142/64  Pulse: (!) 57 (!) 48  Resp: 16 20  Temp: 99.3 F (37.4 C) (!) 97.5 F (36.4 C)  SpO2: 96% 98%   Carron is watching TV.  He is alert and oriented.  He is in better spirits today than Monday. Abdomen-soft and nontender.  No suprapubic or inguinal cellulitis no erythema. GU- I took down the pure wick and examined the penis and the scrotum.  There is moderate penoscrotal edema but no crepitus.  Minimal cellulitis.  Right testicle remains swollen and indurated consistent with orchitis but no fluctuance.  The glans and meatus were examined and appeared normal.  The pure wick on the wall contained urine with grade 2-3 hematuria.  No clots.  CBC    Component Value Date/Time   WBC 10.4 05/09/2022 0451   RBC 3.33 (L) 05/09/2022 1512   RBC 3.32 (L) 05/09/2022 0451   HGB 8.8 (L) 05/09/2022 0451   HGB 13.4 08/23/2021 0934   HCT 29.4 (L) 05/09/2022 0451   HCT 41.7 08/23/2021 0934   PLT 160 05/09/2022 0451   MCV 88.6 05/09/2022 0451   MCV 91 08/23/2021 0934   MCH 26.5 05/09/2022 0451   MCHC 29.9 (L) 05/09/2022  0451   RDW 16.1 (H) 05/09/2022 0451   RDW 14.8 08/23/2021 0934   LYMPHSABS 0.6 (L) 05/06/2022 1047   LYMPHSABS 1.5 08/23/2021 0934   MONOABS 1.3 (H) 05/06/2022 1047   EOSABS 0.0 05/06/2022 1047   EOSABS 0.2 08/23/2021 0934   BASOSABS 0.1 05/06/2022 1047   BASOSABS 0.1 08/23/2021 0934   Lab Results  Component Value Date   CREATININE 1.24 05/09/2022   CREATININE 1.26 (H) 05/08/2022   CREATININE 0.99 05/07/2022      Component 3 d ago  Specimen Description URINE, CLEAN CATCH Performed at [facility], [address] [place], [place] [address]  Special Requests NONE Performed at [facility], [address] [place], [place] [address]  Culture >=100,000 COLONIES/mL PSEUDOMONAS AERUGINOSA      CT abdomen and pelvis-images reviewed.  Penoscrotal edema.  No fluid collections or air.  No clot in bladder.  Bladder not significantly distended.  Assessment/plan:  Right orchitis-appreciate Dr. Crissie Figures input.  Tailor antibiotics and on my exam he is much less tender and improving. Can go home on p.o. antibiotics and stable from an ID and medical point of view.  Gross hematuria-this is likely from hemorrhagic cystitis which could be a combination of infectious and postradiation. He does not need surgical intervention as he  has no clots or painful inability to void.  Continue medical management and resuscitation.  Appreciate excellent TRH care.  I will be unavailable the next few days, but please page GU on-call with any questions, concerns or changes in patient's status.

## 2022-05-09 NOTE — Progress Notes (Signed)
Mobility Specialist Progress Note:    05/09/22 1100  Mobility  Activity Ambulated with assistance in hallway  Level of Assistance Contact guard assist, steadying assist  Assistive Device Front wheel walker  Distance Ambulated (ft) 150 ft  Activity Response Tolerated well  Mobility Referral Yes  $Mobility charge 1 Mobility   Pt was agreeable for mobility. Tolerated well, asx throughout. Left pt in chair, call bell in reach, all needs met.   Royetta Crochet Mobility Specialist Please contact via Solicitor or  Rehab office at [phone]

## 2022-05-09 NOTE — Progress Notes (Signed)
Occupational Therapy Treatment Patient Details Name: Troy [name] MRN: [id number] DOB: 09-13-1940 Today's Date: 05/09/2022   History of present illness Troy [name] is a 82 year old male with extensive history of HLD, CABG, CAD, DM 2, HTN, A-fib (on Coumadin), chronic anemia, epididymoorchitis .[name]... Presented with chief complaint of right testicular pain.     Patient reports that he has had some urinary transient and epididymoorchitis in August 2023 was seen and treated by urologist Dr. [name].     Apparently he has visited Dr. [name] in past week for urinary retention Foley catheter was placed on January 25th, currently was follow-up for catheter obstruction which cleared by flushing.  He was seen in office again 5 days ago for catheter removal, and post void residual on May 01, 2022.  Urine culture from January 25 has been negative.... Patient reports that he has been able to drain his bladder, has some incontinency.  He is bearing briefs..  Noted he was passing blood clots last week which has resolved.     Today he is reporting of significant right testicular pain started last night.  Tylenol did not help.  This morning was quite worse was associate with nausea vomiting but no fever   OT comments  Pt agreeable to OT treatment. Pt required no physical assist today for bed mobility or functional mobility during tranfers. Pt was able to ambulate over 100 feet in the hall with supervision assist using RW. Pt still complains of testicular pain but shows improved function. At this point pt is needing mostly supervision level of assist for safety. Pt was left in the chair with call bell within reach. Pt will benefit from continued OT in the hospital and recommended venue below to increase strength, balance, and endurance for safe ADL's.      Recommendations for follow up therapy are one component of a multi-disciplinary discharge planning process, led by the attending physician.  Recommendations  may be updated based on patient status, additional functional criteria and insurance authorization.    Follow Up Recommendations  Skilled nursing-short term rehab (<3 hours/day)     Assistance Recommended at Discharge Intermittent Supervision/Assistance  Patient can return home with the following  A little help with walking and/or transfers;A lot of help with bathing/dressing/bathroom;Assistance with cooking/housework   Equipment Recommendations  None recommended by OT          Precautions / Restrictions Precautions Precautions: Fall Restrictions Weight Bearing Restrictions: No       Mobility Bed Mobility Overal bed mobility: Needs Assistance Bed Mobility: Supine to Sit     Supine to sit: HOB elevated, Supervision     General bed mobility comments: Pt partially sitting at EOB at start of session.    Transfers Overall transfer level: Needs assistance Equipment used: Rolling walker (2 wheels) Transfers: Sit to/from Stand, Bed to chair/wheelchair/BSC Sit to Stand: Supervision     Step pivot transfers: Supervision     General transfer comment: Labored movement but no physical assist needed. Ambulatory transfer to chair from EOB.     Balance Overall balance assessment: Needs assistance Sitting-balance support: Feet supported, No upper extremity supported Sitting balance-Leahy Scale: Good Sitting balance - Comments: fair to good seated at EOB   Standing balance support: During functional activity, Bilateral upper extremity supported Standing balance-Leahy Scale: Fair Standing balance comment: fair using RW  ADL either performed or assessed with clinical judgement   ADL Overall ADL's : Needs assistance/impaired     Grooming: Wash/dry hands;Wash/dry face;Brushing hair;Standing;Supervision/safety Grooming Details (indicate cue type and reason): Grooming completed with pt standing at the sink.                              Functional mobility during ADLs: Supervision/safety;Rolling walker (2 wheels) General ADL Comments: Pt ambulated in  hall for over 100 feet without labored breathing.      Cognition Arousal/Alertness: Awake/alert Behavior During Therapy: WFL for tasks assessed/performed Overall Cognitive Status: Within Functional Limits for tasks assessed                                                             Pertinent Vitals/ Pain       Pain Assessment Pain Assessment: Faces Faces Pain Scale: Hurts little more Pain Location: right testicle Pain Descriptors / Indicators: Grimacing, Guarding Pain Intervention(s): Limited activity within patient's tolerance, Monitored during session, Repositioned                                                          Frequency  Min 2X/week        Progress Toward Goals  OT Goals(current goals can now be found in the care plan section)  Progress towards OT goals: Progressing toward goals  Acute Rehab OT Goals Patient Stated Goal: return home OT Goal Formulation: With patient Time For Goal Achievement: 05/22/22 Potential to Achieve Goals: Good ADL Goals Pt Will Perform Grooming: with modified independence;standing Pt Will Perform Lower Body Bathing: with modified independence;sitting/lateral leans Pt Will Perform Lower Body Dressing: with modified independence;sitting/lateral leans Pt Will Transfer to Toilet: with modified independence;ambulating Pt Will Perform Toileting - Clothing Manipulation and hygiene: with modified independence;sitting/lateral leans  Plan Discharge plan remains appropriate                                    End of Session Equipment Utilized During Treatment: Rolling walker (2 wheels)  OT Visit Diagnosis: Unsteadiness on feet (R26.81);Other abnormalities of gait and mobility (R26.89);Muscle weakness (generalized) (M62.81);Pain Pain - Right/Left:  Right Pain - part of body:  (testicles)   Activity Tolerance Patient tolerated treatment well   Patient Left in chair;with call bell/phone within reach             Time: 4496-7591 OT Time Calculation (min): 17 min  Charges: OT General Charges $OT Visit: 1 Visit OT Treatments $Therapeutic Exercise: 8-22 mins  Troy [name] OT, MOT   Larey Seat 05/09/2022, 9:20 AM

## 2022-05-09 NOTE — Progress Notes (Signed)
PROGRESS NOTE    Troy [name]  HAL:[id number] DOB: 08-06-1940 DOA: 05/06/2022 PCP: Sharilyn Sites, MD   Brief Narrative:    MCCABE Troy [name] is a 82 year old male with extensive history of HLD, CABG, CAD, DM 2, HTN, A-fib (on Coumadin), chronic anemia, epididymoorchitis .Troy [name]... Presented with chief complaint of right testicular pain.  He had ultrasound of the scrotum 2/4 with findings of epididymitis and orchitis and was seen by urology 2/5 with plans to continue IV antibiotics for now.  Urine cultures positive for Pseudomonas and ID recommending switch to cefepime as well as pelvic CT to evaluate prostate.  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 ----Pulmonary HTN    Debility  Assessment and Plan:  Orchitis and epididymitis with Pseudomonas infection -Appreciate ID evaluation with recommendation to switch to cefepime 2/7 -Check pelvic CT to evaluate prostate -Consider further urology intervention as needed -Leukocytosis improved, monitor CBC -Blood cultures with no growth     Acute urinary obstruction-resolved -History of urinary retention, - Voiding now - no signs of retention,  -Monitoring closely, in and out as needed,    Chronic heart failure with preserved ejection fraction (HFpEF) ([place]) -Will continue home medications including losartan,  Aldactone -Demadex on hold will restart in a.m. 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 ----Pulmonary HTN  -stable  -History of pulmonary hypertension -Monitoring closely   CORONARY ARTERY BYPASS GRAFT, HX OF - Continue current medication including: Imdur, statins, losartan, Toprol, Coumadin   Hyperlipidemia -Continue statins   Debility -PT/OT consulted-status post evaluation-recommending SNF, but patient wants to go home with home health services  Obesity -BMI 31.16  DVT prophylaxis:Warfarin Code Status: Full Family Communication: None at bedside Disposition Plan:  Status is: Inpatient Remains inpatient appropriate because: Need for IV medications  Consultants:  Urology ID  Procedures:  None  Antimicrobials:  Anti-infectives (From admission, onward)    Start     Dose/Rate Route Frequency Ordered Stop   05/09/22 1600  ceFEPIme (MAXIPIME) 2 g in sodium chloride 0.9 % 100 mL IVPB        2 g 200 mL/hr over 30 Minutes Intravenous Every 8 hours 05/09/22 1203     05/06/22 2200  Ampicillin-Sulbactam (UNASYN) 3 g in sodium chloride 0.9 % 100 mL IVPB  Status:  Discontinued        3 g 200 mL/hr over 30 Minutes Intravenous Every 8 hours 05/06/22 1531 05/06/22 1552   05/06/22 2200  Ampicillin-Sulbactam (UNASYN) 3 g in sodium chloride 0.9 % 100 mL IVPB  Status:  Discontinued        3 g 200 mL/hr over 30 Minutes Intravenous Every 6 hours 05/06/22 1552 05/09/22 1203   05/06/22 1500  cefTRIAXone (ROCEPHIN) injection 1 g  Status:  Discontinued        1 g Intramuscular Every 24 hours 05/06/22 1448 05/06/22  1521   05/06/22 1500  doxycycline (VIBRAMYCIN) capsule 100 mg  Status:  Discontinued        100 mg Oral Every 12 hours 05/06/22 1452 05/06/22 1521   05/06/22 1245  levofloxacin (LEVAQUIN) IVPB 500 mg        500 mg 100 mL/hr over 60 Minutes Intravenous  Once 05/06/22 1238 05/06/22 1435      Subjective: Patient seen  and evaluated today with ongoing complaints of right testicular pain noted.  No fever overnight.  Continues to urinate in male wick catheter with no issues.  Objective: Vitals:   05/08/22 1935 05/08/22 2201 05/09/22 0414 05/09/22 0500  BP: (!) 91/48 (!) 103/59 (!) 109/57   Pulse: (!) 55 (!) 54 (!) 57   Resp: '18 20 16   '$ Temp: 97.7 F (36.5 C) 97.8 F (36.6 C) 99.3 F (37.4 C)   TempSrc: Oral Oral    SpO2: 94% 94% 96%   Weight:    98.5 kg  Height:        Intake/Output Summary (Last 24 hours) at 05/09/2022 1428 Last data filed at 05/09/2022 0500 Gross per 24 hour  Intake 240 ml  Output 1850 ml  Net -1610 ml   Filed Weights   05/07/22 0500 05/08/22 0429 05/09/22 0500  Weight: 95.1 kg 94.4 kg 98.5 kg    Examination:  General exam: Appears calm and comfortable  Respiratory system: Clear to auscultation. Respiratory effort normal. Cardiovascular system: S1 & S2 heard, RRR.  Gastrointestinal system: Abdomen is soft Central nervous system: Alert and awake Extremities: No edema Skin: No significant lesions noted Psychiatry: Flat affect.    Data Reviewed: I have personally reviewed following labs and imaging studies  CBC: Recent Labs  Lab 05/06/22 1047 05/07/22 0433 05/08/22 0414 05/09/22 0451  WBC 17.1* 25.9* 17.9* 10.4  NEUTROABS 14.9*  --   --   --   HGB 11.7* 9.6* 9.1* 8.8*  HCT 35.7* 31.6* 30.0* 29.4*  MCV 86.2 88.8 89.3 88.6  PLT 194 179 153 381   Basic Metabolic Panel: Recent Labs  Lab 05/06/22 1047 05/06/22 1446 05/07/22 0433 05/08/22 0414 05/09/22 0451  NA 137  --  134* 134* 137  K 3.8  --  3.6 3.5 3.5  CL 101  --  101 102 102  CO2 25  --  '24 24 25  '$ GLUCOSE 201*  --  144* 126* 142*  BUN 22  --  19 25* 32*  CREATININE 1.08  --  0.99 1.26* 1.24  CALCIUM 8.8*  --  7.7* 7.3* 7.3*  MG 1.2*  --   --   --   --   PHOS  --  1.6*  --   --   --    GFR: Estimated Creatinine Clearance: 56.8 mL/min (by C-G formula based on SCr of 1.24 mg/dL). Liver Function  Tests: Recent Labs  Lab 05/06/22 1047  AST 29  ALT 11  ALKPHOS 75  BILITOT 0.8  PROT 7.2  ALBUMIN 3.6   No results for input(s): "LIPASE", "AMYLASE" in the last 168 hours. No results for input(s): "AMMONIA" in the last 168 hours. Coagulation Profile: Recent Labs  Lab 05/06/22 1047 05/07/22 0433 05/08/22 0414 05/09/22 0451  INR 3.0* 2.9* 3.3* 3.6*   Cardiac Enzymes: No results for input(s): "CKTOTAL", "CKMB", "CKMBINDEX", "TROPONINI" in the last 168 hours. BNP (last 3 results) No results for input(s): "PROBNP" in the last 8760 hours. HbA1C: Recent Labs    05/06/22 1523  HGBA1C 6.9*  CBG: Recent Labs  Lab 05/08/22 0800 05/08/22 1134 05/08/22 2204 05/09/22 0807 05/09/22 1220  GLUCAP 99 183* 161* 121* 138*   Lipid Profile: No results for input(s): "CHOL", "HDL", "LDLCALC", "TRIG", "CHOLHDL", "LDLDIRECT" in the last 72 hours. Thyroid Function Tests: No results for input(s): "TSH", "T4TOTAL", "FREET4", "T3FREE", "THYROIDAB" in the last 72 hours. Anemia Panel: No results for input(s): "VITAMINB12", "FOLATE", "FERRITIN", "TIBC", "IRON", "RETICCTPCT" in the last 72 hours. Sepsis Labs: No results for input(s): "PROCALCITON", "LATICACIDVEN" in the last 168 hours.  Recent Results (from the past 240 hour(s))  Remove urinary catheter to obtain Clean Catch urine culture     Status: Abnormal   Collection Time: 05/06/22 11:53 AM   Specimen: Urine, Clean Catch  Result Value Ref Range Status   Specimen Description   Final    URINE, CLEAN CATCH Performed at [facility], [address] [place], [place] [address]    Special Requests   Final    NONE Performed at [facility], [address], [place], [place] [address]    Culture >=100,000 COLONIES/mL PSEUDOMONAS AERUGINOSA (A)  Final   Report Status 05/09/2022 FINAL  Final   Organism ID, Bacteria PSEUDOMONAS AERUGINOSA (A)  Final      Susceptibility   Pseudomonas aeruginosa - MIC*    CEFTAZIDIME 4 SENSITIVE Sensitive      CIPROFLOXACIN 1 INTERMEDIATE Intermediate     GENTAMICIN <=1 SENSITIVE Sensitive     IMIPENEM 1 SENSITIVE Sensitive     PIP/TAZO <=4 SENSITIVE Sensitive     CEFEPIME 2 SENSITIVE Sensitive     * >=100,000 COLONIES/mL PSEUDOMONAS AERUGINOSA  Culture, blood (Routine X 2) w Reflex to ID Panel     Status: None (Preliminary result)   Collection Time: 05/06/22  2:48 PM   Specimen: BLOOD LEFT ARM  Result Value Ref Range Status   Specimen Description BLOOD LEFT ARM BOTTLES DRAWN AEROBIC AND ANAEROBIC  Final   Special Requests Blood Culture adequate volume  Final   Culture   Final    NO GROWTH 2 DAYS Performed at [facility], [address] [place], [place] [address]    Report Status PENDING  Incomplete  Culture, blood (Routine X 2) w Reflex to ID Panel     Status: None (Preliminary result)   Collection Time: 05/06/22  2:53 PM   Specimen: BLOOD RIGHT ARM  Result Value Ref Range Status   Specimen Description   Final    BLOOD RIGHT ARM BOTTLES DRAWN AEROBIC AND ANAEROBIC   Special Requests Blood Culture adequate volume  Final   Culture   Final    NO GROWTH 2 DAYS Performed at [facility], [address] [place], [place] [address]    Report Status PENDING  Incomplete         Radiology Studies: No results found.      Scheduled Meds:  acidophilus  2 capsule Oral TID   atorvastatin  40 mg Oral Daily   dutasteride  0.5 mg Oral Daily   ibuprofen  600 mg Oral TID   insulin aspart  0-9 Units Subcutaneous TID WC   isosorbide mononitrate  120 mg Oral QPM   latanoprost  1 drop Both Eyes QHS   losartan  25 mg Oral Daily   metoprolol succinate  12.5 mg Oral Daily   pantoprazole  40 mg Oral Daily   sodium chloride flush  3 mL Intravenous Q12H   sodium chloride flush  3 mL Intravenous Q12H   tamsulosin  0.4 mg  Oral QPC supper   torsemide  20 mg Oral Daily   Warfarin - Pharmacist Dosing Inpatient   Does not apply q1600   Continuous Infusions:  sodium chloride     ceFEPime  (MAXIPIME) IV       LOS: 3 days    Time spent: 35 minutes    Charmeka [place] Darleen Crocker, DO [facility]  If 7PM-7AM, please contact night-coverage [email or web address] 05/09/2022, 2:28 PM

## 2022-05-09 NOTE — TOC Progression Note (Signed)
Transition of Care [facility]) - Progression Note    Patient Details  Name: Troy [name] MRN: [id number] Date of Birth: 1940/07/24  Transition of Care [facility] CM/SW Contact  Shade Flood, LCSW Phone Number: 05/09/2022, 2:13 PM  Clinical Narrative:     TOC following. Met with pt today to review dc planning. MD anticipating dc in 1-2 days. Pt reports doing better and that he feels he can return home with HH at dc. Per pt, [facility] PT/OT recently dc'd him from [facility] services and he would like to have them again if possible. Pt also asking if TOC can call [facility] to see if he can get an increase in hours for his aide services and also a purewick.  This LCSW called [facility] SW, Kathlin, to relay above. She states that pt has [facility] auth good through 07/09/22 so TOC can provide [facility] with [facility] orders and they will be paid through the [place]. She states that she will refer the other two requests to pt's PCP and the PCP/RN will reach out to pt to assist. They request dc summary be faxed to the clinic when pt ready for dc. [phone].  TOC will follow.  Expected Discharge Plan: [place] Barriers to Discharge: Continued Medical Work up  Expected Discharge Plan and Services In-house Referral: Clinical Social Work   Post Acute Care Choice: Home Health, Resumption of Svcs/PTA Provider Living arrangements for the past 2 months: [place]: PT, OT Long Agency: [place] ([facility]) Date HH Agency Contacted: 05/09/22   Representative spoke with at [place]: [place] ([place]) Interventions SDOH Screenings   Food Insecurity: No Food Insecurity (05/08/2022)  Housing: Low Risk  (05/08/2022)  Transportation Needs: No Transportation Needs (05/08/2022)  Utilities: Not At Risk (05/08/2022)  Tobacco Use: Medium Risk (05/06/2022)    Readmission Risk Interventions    05/07/2022   11:28 AM 11/07/2021   12:48 PM   Readmission Risk Prevention Plan  Transportation Screening Complete Complete  Home Care Screening  Complete  Medication Review (RN CM)  Complete  Medication Review Press photographer) Complete   HRI or Home Care Consult Complete   SW Recovery Care/Counseling Consult Complete   Palliative Care Screening Not Applicable   Skilled Nursing Facility Complete

## 2022-05-09 NOTE — Progress Notes (Signed)
Patient slept through the night no complaints of pain. Continued to monitor.

## 2022-05-09 NOTE — Progress Notes (Signed)
[facility] for Infectious Disease  Total days of antibiotics 4/amp/sub         Reason for Consult: orchitis  Referring Physician: shah  Principal Problem:   Orchitis and epididymitis Active Problems:   Hyperlipidemia   CORONARY ARTERY BYPASS GRAFT, HX OF   Type 2 diabetes mellitus (HCC)   Atrial fibrillation (HCC)   --Severe pulmonary HTN, 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 ----Pulmonary HTN    Chronic heart failure with preserved ejection fraction (HFpEF) (HCC)   Acute urinary obstruction   Debility    HPI: Troy [name] is a 82 y.o. male with hx of CAD, atrial fib,hx of prostate ca and brachytherapy, hx of epididymoorchitis in August 2023, recently had urinary retention requiring urinary catheter in late January. He was admitted on 05/06/22 for acute onset of right testicular pain that occurred the night prior to admission. Severe pain associated nausea and vomiting. Uro exam showed significant tenderness to right scrotum/testicle and mild tenderness to the left. His labs significant for marked leukocytosis of 29K, UA had 11-22 wbc, rare bacteria, RBC 50, he was started on amp/sub for orchitis. He was seen by urology on 2/5. Since hx of enterococcal uti, patient was switched from FQ to amp/sub for right epididymoorchitis. Also anticipate that the patient would have pain and swelling persistent for weeks. If patient to have difficulty with urinary, then he would need foley catheter. Patient reported blood in urine.  Infectious work up: blood cx NGTD, but urine cx showing PsA  Due to still having pain, pelvic CT done on 2/7 still showing marked swelling penis and scrotum. No abscess or fourniers.   Leukocytosis improving now down to 10.4  Past Medical History:  Diagnosis Date   AAA (abdominal aortic aneurysm) ([place])    Followed by Dr. Sherren Mocha Early   Arthritis    CAD (coronary artery disease)    a. CABG 2000.   Cancer [facility])    Prostate:  Radiation Tx   Carotid artery disease ([place])    Chest pain     precordial. mild chronic .[name]... nonischemic   CHF (congestive heart failure) (HCC)    Chronic edema    Coronary artery disease    a. Nuclear, January, 2008, no ischemia b. Cath 08/2012- 1/4 patent grafts, RCA CTO, no flow-limiting disease, medically managed   Diabetes mellitus without complication ([place])    Dizziness 02/2011   Dyslipidemia    Fall    GERD (gastroesophageal reflux disease)    TAKES TUMS & ROLAIDS AS NEEDED   Heart murmur    Hx of CABG 2000   Hypertension    Myocardial infarction ([place]) 1999   Neck pain 02/2011   Pneumonia    Pulmonary hypertension ([place])    Renal artery stenosis (HCC)    50-70%   S/P femoropopliteal bypass surgery    Dr. Donnetta Hutching   Sinus bradycardia    Asymptomatic    Allergies:  Allergies  Allergen Reactions   Niacin Itching and Rash    Burning sensation   Contrast Media [Iodinated Contrast Media] Nausea And Vomiting   Iodine-131 Nausea And Vomiting   Nsaids Other (See Comments)    Taking Coumadin    Current antibiotics:   MEDICATIONS:  acidophilus  2 capsule Oral TID   atorvastatin  40 mg Oral Daily   dutasteride  0.5 mg Oral Daily   ibuprofen  600 mg Oral TID   insulin aspart  0-9 Units Subcutaneous TID WC   isosorbide mononitrate  120  mg Oral QPM   latanoprost  1 drop Both Eyes QHS   losartan  25 mg Oral Daily   metoprolol succinate  12.5 mg Oral Daily   pantoprazole  40 mg Oral Daily   sodium chloride flush  3 mL Intravenous Q12H   sodium chloride flush  3 mL Intravenous Q12H   tamsulosin  0.4 mg Oral QPC supper   torsemide  20 mg Oral Daily   Warfarin - Pharmacist Dosing Inpatient   Does not apply q1600    Social History   Tobacco Use   Smoking status: Former    Packs/day: 2.00    Years: 70.00    Total pack years: 140.00    Types: Cigarettes    Quit date: 04/21/2018    Years since quitting: 4.0   Smokeless tobacco: Never  Vaping Use   Vaping Use: Never used  Substance Use Topics   Alcohol use: Yes     Alcohol/week: 0.0 - 2.0 standard drinks of alcohol    Comment: OCCASIONAL   Drug use: Never    Family History  Problem Relation Age of Onset   Deep vein thrombosis Father    Lung cancer Sister    Diabetes Sister    Heart disease Sister        After age 62   Hyperlipidemia Sister    Hypertension Sister    Lung cancer Sister    Breast cancer Sister    Hypertension Mother    Diabetes Sister    Coronary artery disease Other        family hx of   Cancer Brother        "crab cancer"   Heart disease Brother    Heart attack Brother    Heart attack Daughter    Colon cancer Neg Hx    Stroke Neg Hx     Review of Systems - did not examine   OBJECTIVE: Temp:  [97.5 F (36.4 C)-99.3 F (37.4 C)] 97.5 F (36.4 C) (02/07 1454) Pulse Rate:  [48-57] 48 (02/07 1454) Resp:  [16-20] 20 (02/07 1454) BP: (91-142)/(48-64) 142/64 (02/07 1454) SpO2:  [94 %-98 %] 98 % (02/07 1454) Weight:  [98.5 kg] 98.5 kg (02/07 0500) Did not examine  LABS: Results for orders placed or performed during the hospital encounter of 05/06/22 (from the past 48 hour(s))  Glucose, capillary     Status: Abnormal   Collection Time: 05/07/22  5:25 PM  Result Value Ref Range   Glucose-Capillary 132 (H) 70 - 99 mg/dL    Comment: Glucose reference range applies only to samples taken after fasting for at least 8 hours.   Comment 1 Notify RN    Comment 2 Document in Chart   Glucose, capillary     Status: Abnormal   Collection Time: 05/07/22  8:39 PM  Result Value Ref Range   Glucose-Capillary 113 (H) 70 - 99 mg/dL    Comment: Glucose reference range applies only to samples taken after fasting for at least 8 hours.   Comment 1 Notify RN    Comment 2 Document in Chart   Basic metabolic panel     Status: Abnormal   Collection Time: 05/08/22  4:14 AM  Result Value Ref Range   Sodium 134 (L) 135 - 145 mmol/L   Potassium 3.5 3.5 - 5.1 mmol/L   Chloride 102 98 - 111 mmol/L   CO2 24 22 - 32 mmol/L   Glucose, Bld  126 (H) 70 - 99 mg/dL  Comment: Glucose reference range applies only to samples taken after fasting for at least 8 hours.   BUN 25 (H) 8 - 23 mg/dL   Creatinine, Ser 1.26 (H) 0.61 - 1.24 mg/dL   Calcium 7.3 (L) 8.9 - 10.3 mg/dL   GFR, Estimated 58 (L) >60 mL/min    Comment: (NOTE) Calculated using the CKD-EPI Creatinine Equation (2021)    Anion gap 8 5 - 15    Comment: Performed at [facility], [address], [place], [place] [address]  CBC     Status: Abnormal   Collection Time: 05/08/22  4:14 AM  Result Value Ref Range   WBC 17.9 (H) 4.0 - 10.5 K/uL   RBC 3.36 (L) 4.22 - 5.81 MIL/uL   Hemoglobin 9.1 (L) 13.0 - 17.0 g/dL   HCT 30.0 (L) 39.0 - 52.0 %   MCV 89.3 80.0 - 100.0 fL   MCH 27.1 26.0 - 34.0 pg   MCHC 30.3 30.0 - 36.0 g/dL   RDW 15.8 (H) 11.5 - 15.5 %   Platelets 153 150 - 400 K/uL   nRBC 0.0 0.0 - 0.2 %    Comment: Performed at [facility], [address], [place], [place] [address]  Protime-INR     Status: Abnormal   Collection Time: 05/08/22  4:14 AM  Result Value Ref Range   Prothrombin Time 33.5 (H) 11.4 - 15.2 seconds   INR 3.3 (H) 0.8 - 1.2    Comment: (NOTE) INR goal varies based on device and disease states. Performed at [facility], [address] [place], [place] [address]   Glucose, capillary     Status: None   Collection Time: 05/08/22  8:00 AM  Result Value Ref Range   Glucose-Capillary 99 70 - 99 mg/dL    Comment: Glucose reference range applies only to samples taken after fasting for at least 8 hours.   Comment 1 Notify RN    Comment 2 Document in Chart   Glucose, capillary     Status: Abnormal   Collection Time: 05/08/22 11:34 AM  Result Value Ref Range   Glucose-Capillary 183 (H) 70 - 99 mg/dL    Comment: Glucose reference range applies only to samples taken after fasting for at least 8 hours.   Comment 1 Notify RN    Comment 2 Document in Chart   Glucose, capillary     Status: Abnormal   Collection Time: 05/08/22 10:04 PM  Result Value  Ref Range   Glucose-Capillary 161 (H) 70 - 99 mg/dL    Comment: Glucose reference range applies only to samples taken after fasting for at least 8 hours.   Comment 1 Notify RN    Comment 2 Document in Chart   Basic metabolic panel     Status: Abnormal   Collection Time: 05/09/22  4:51 AM  Result Value Ref Range   Sodium 137 135 - 145 mmol/L   Potassium 3.5 3.5 - 5.1 mmol/L   Chloride 102 98 - 111 mmol/L   CO2 25 22 - 32 mmol/L   Glucose, Bld 142 (H) 70 - 99 mg/dL    Comment: Glucose reference range applies only to samples taken after fasting for at least 8 hours.   BUN 32 (H) 8 - 23 mg/dL   Creatinine, Ser 1.24 0.61 - 1.24 mg/dL   Calcium 7.3 (L) 8.9 - 10.3 mg/dL   GFR, Estimated 59 (L) >60 mL/min    Comment: (NOTE) Calculated using the CKD-EPI Creatinine Equation (2021)  Anion gap 10 5 - 15    Comment: Performed at [facility], [address] [place], [place] [address]  CBC     Status: Abnormal   Collection Time: 05/09/22  4:51 AM  Result Value Ref Range   WBC 10.4 4.0 - 10.5 K/uL   RBC 3.32 (L) 4.22 - 5.81 MIL/uL   Hemoglobin 8.8 (L) 13.0 - 17.0 g/dL   HCT 29.4 (L) 39.0 - 52.0 %   MCV 88.6 80.0 - 100.0 fL   MCH 26.5 26.0 - 34.0 pg   MCHC 29.9 (L) 30.0 - 36.0 g/dL   RDW 16.1 (H) 11.5 - 15.5 %   Platelets 160 150 - 400 K/uL   nRBC 0.0 0.0 - 0.2 %    Comment: Performed at [facility], [address] [place], [place] [address]  Protime-INR     Status: Abnormal   Collection Time: 05/09/22  4:51 AM  Result Value Ref Range   Prothrombin Time 35.3 (H) 11.4 - 15.2 seconds   INR 3.6 (H) 0.8 - 1.2    Comment: (NOTE) INR goal varies based on device and disease states. Performed at [facility], [address], [place], [place] [address]   Glucose, capillary     Status: Abnormal   Collection Time: 05/09/22  8:07 AM  Result Value Ref Range   Glucose-Capillary 121 (H) 70 - 99 mg/dL    Comment: Glucose reference range applies only to samples taken after fasting for at least 8  hours.   Comment 1 Notify RN    Comment 2 Document in Chart   Glucose, capillary     Status: Abnormal   Collection Time: 05/09/22 12:20 PM  Result Value Ref Range   Glucose-Capillary 138 (H) 70 - 99 mg/dL    Comment: Glucose reference range applies only to samples taken after fasting for at least 8 hours.   Comment 1 Notify RN    Comment 2 Document in Chart   Vitamin B12     Status: None   Collection Time: 05/09/22  3:12 PM  Result Value Ref Range   Vitamin B-12 210 180 - 914 pg/mL    Comment: (NOTE) This assay is not validated for testing neonatal or myeloproliferative syndrome specimens for Vitamin B12 levels. Performed at [facility], [address] [place], [place] [address]   Iron and TIBC     Status: Abnormal   Collection Time: 05/09/22  3:12 PM  Result Value Ref Range   Iron 15 (L) 45 - 182 ug/dL   TIBC 287 250 - 450 ug/dL   Saturation Ratios 5 (L) 17.9 - 39.5 %   UIBC 272 ug/dL    Comment: Performed at [facility], [address], [place], [place] [address]  Ferritin     Status: None   Collection Time: 05/09/22  3:12 PM  Result Value Ref Range   Ferritin 93 24 - 336 ng/mL    Comment: Performed at [facility], [address], [place], [place] [address]  Reticulocytes     Status: Abnormal   Collection Time: 05/09/22  3:12 PM  Result Value Ref Range   Retic Ct Pct 1.4 0.4 - 3.1 %   RBC. 3.33 (L) 4.22 - 5.81 MIL/uL   Retic Count, Absolute 47.6 19.0 - 186.0 K/uL   Immature Retic Fract 26.1 (H) 2.3 - 15.9 %    Comment: Performed at [facility], [address], [place], [place] [address]  Glucose, capillary     Status: Abnormal  Collection Time: 05/09/22  4:20 PM  Result Value Ref Range   Glucose-Capillary 149 (H) 70 - 99 mg/dL    Comment: Glucose reference range applies only to samples taken after fasting for at least 8 hours.    MICRO: Pseudomonas aeruginosa      MIC    CEFEPIME 2 SENSITIVE Sensitive    CEFTAZIDIME 4 SENSITIVE Sensitive    CIPROFLOXACIN 1  INTERMEDI... Intermediate    GENTAMICIN <=1 SENSITIVE Sensitive    IMIPENEM 1 SENSITIVE Sensitive    PIP/TAZO <=4 SENSITIVE Sensitive    IMAGING: CT PELVIS WO CONTRAST  Result Date: 05/09/2022 CLINICAL DATA:  Soft tissue infection suspected. EXAM: CT PELVIS WITHOUT CONTRAST TECHNIQUE: Multidetector CT imaging of the pelvis was performed following the standard protocol without intravenous contrast. RADIATION DOSE REDUCTION: This exam was performed according to the departmental dose-optimization program which includes automated exposure control, adjustment of the mA and/or kV according to patient size and/or use of iterative reconstruction technique. COMPARISON:  Scrotal ultrasound 05/06/2022 FINDINGS: Urinary Tract: The bladder is unremarkable. No bladder mass or calculi. Bowel: The rectum, sigmoid colon visualized small-bowel loops are grossly normal. There is a masslike intraluminal lesion involving the ascending colon just above the ileocecal valve. It is possible this is succus entericus from the small but could not exclude a mass. Recommend correlation with colon cancer screening history. Vascular/Lymphatic: Stable 5 cm infrarenal abdominal aortic aneurysm just above the iliac artery bifurcation. 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 Advanced iliac artery atherosclerotic calcifications. Recommend referral to a vascular specialist. This recommendation follows ACR consensus guidelines: White Paper of the ACR Incidental Findings Committee II on Vascular Findings. J Am Coll Radiol 2013; 10:789-794. Reproductive: Brachytherapy seeds noted throughout the prostate gland. The seminal vesicles are unremarkable. Marked inflammations/edema involving the penis and scrotum. I do not see any obvious gas in the soft tissues but would be worried about early Fourniers gangrene. Recommend close clinical observation. Other: No free pelvic fluid. Small periumbilical abdominal wall hernia  containing fat. There is a 3.5 cm subcutaneous lesion involving the anterolateral left thigh. Indeterminate finding. Recommend clinical correlation. If this is firm and fixed it may require biopsy. Ultrasound could be helpful. Musculoskeletal: No significant bony findings. IMPRESSION: 1. Marked inflammations/edema involving the penis and scrotum. I do not see any obvious gas in the soft tissues but would be worried about early Fournier's gangrene. Recommend close clinical observation. 2. Stable 5 cm infrarenal abdominal aortic aneurysm just above the iliac artery bifurcation. 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 3. 3.5 cm subcutaneous lesion involving the anterolateral left thigh. Indeterminate finding. If this is firm and fixed it may require biopsy. Ultrasound could be helpful. 4. Masslike intraluminal lesion involving the ascending colon just above the ileocecal valve. It is possible this is succus entericus from the small but could not exclude a mass. Recommend correlation with colon cancer screening history. These results will be called to the ordering clinician or representative by the Radiologist Assistant, and communication documented in the PACS or [employer]. Electronically Signed   By: Marijo Sanes M.D.   On: 05/09/2022 16:00     Assessment/Plan: 82yo M with right epididymoorchitis, concern that PsA is also contributing to presentation - will switch him to cefepime. Stop amp/sub for the time being - will re-evaluate tomorrow to discuss further management

## 2022-05-09 NOTE — Progress Notes (Signed)
Pt has rested well this shift. Noted pt now has bloody urine. MD made aware. Will continue to monitor.

## 2022-05-09 NOTE — Progress Notes (Signed)
Reviewed pelvic CT with concerns for possible Fournier's gangrene and discussed case with Dr. Junious Silk. Now with frank hematuria as well in the setting of warfarin use. Will plan to give some IV vitamin K and monitor hemoglobin closely. As of right now, no elevation in white cell count or fever noted. Scrotum and penis appear edematous and erythematous, but no areas of necrosis noted.  Dr. Junious Silk to personally evaluate.

## 2022-05-09 NOTE — Progress Notes (Signed)
ANTICOAGULATION CONSULT NOTE -   Pharmacy Consult for Warfarin Indication: atrial fibrillation  Allergies  Allergen Reactions   Niacin Itching and Rash    Burning sensation   Contrast Media [Iodinated Contrast Media] Nausea And Vomiting   Iodine-131 Nausea And Vomiting   Nsaids Other (See Comments)    Taking Coumadin    Patient Measurements: Height: '5\' 10"'$  (177.8 cm) Weight: 98.5 kg (217 lb 2.5 oz) IBW/kg (Calculated) : 73  Vital Signs: Temp: 99.3 F (37.4 C) (02/07 0414) BP: 109/57 (02/07 0414) Pulse Rate: 57 (02/07 0414)  Labs: Recent Labs    05/07/22 0433 05/08/22 0414 05/09/22 0451  HGB 9.6* 9.1* 8.8*  HCT 31.6* 30.0* 29.4*  PLT 179 153 160  APTT 45*  --   --   LABPROT 30.0* 33.5* 35.3*  INR 2.9* 3.3* 3.6*  CREATININE 0.99 1.26* 1.24     Estimated Creatinine Clearance: 56.8 mL/min (by C-G formula based on SCr of 1.24 mg/dL).   Medical History: Past Medical History:  Diagnosis Date   AAA (abdominal aortic aneurysm) ([place])    Followed by Dr. Sherren Mocha Early   Arthritis    CAD (coronary artery disease)    a. CABG 2000.   Cancer [facility])    Prostate:  Radiation Tx   Carotid artery disease ([place])    Chest pain    precordial. mild chronic .[name]... nonischemic   CHF (congestive heart failure) (HCC)    Chronic edema    Coronary artery disease    a. Nuclear, January, 2008, no ischemia b. Cath 08/2012- 1/4 patent grafts, RCA CTO, no flow-limiting disease, medically managed   Diabetes mellitus without complication ([place])    Dizziness 02/2011   Dyslipidemia    Fall    GERD (gastroesophageal reflux disease)    TAKES TUMS & ROLAIDS AS NEEDED   Heart murmur    Hx of CABG 2000   Hypertension    Myocardial infarction ([place]) 1999   Neck pain 02/2011   Pneumonia    Pulmonary hypertension ([place])    Renal artery stenosis (HCC)    50-70%   S/P femoropopliteal bypass surgery    Dr. Donnetta Hutching   Sinus bradycardia    Asymptomatic    Assessment: Presented with chief complaint  of right testicular pain. He has had urinary issues recently  and was passing blood clots. He is chronically anticoagulated for afib with warfarin. Patient told me he tries to keep INR around 2.5-3. Likely with antibiotics starting for presumed orchitis/epipidiymitis would anticipate INR increasing so may need to adjust dose. Pharmacy asked to dose Warfarin.   INR up to 3.6 this morning which is above goal. Hemoglobin continues to trend down to 8.8. No bleeding issues noted. Given INR trend will hold warfarin today.   Home dose is '2mg'$  daily  Goal of Therapy:  INR 2-3 Monitor platelets by anticoagulation protocol: Yes   Plan:  Hold warfarin today PT-INR daily Monitor for S/S of bleeding  Erin Hearing PharmD., BCPS Clinical Pharmacist 05/09/2022 12:54 PM

## 2022-05-10 ENCOUNTER — Ambulatory Visit: Payer: Medicare HMO

## 2022-05-10 DIAGNOSIS — N453 Epididymo-orchitis: Secondary | ICD-10-CM | POA: Diagnosis not present

## 2022-05-10 LAB — BASIC METABOLIC PANEL
Anion gap: 10 (ref 5–15)
BUN: 33 mg/dL — ABNORMAL HIGH (ref 8–23)
CO2: 25 mmol/L (ref 22–32)
Calcium: 7.7 mg/dL — ABNORMAL LOW (ref 8.9–10.3)
Chloride: 104 mmol/L (ref 98–111)
Creatinine, Ser: 1.28 mg/dL — ABNORMAL HIGH (ref 0.61–1.24)
GFR, Estimated: 57 mL/min — ABNORMAL LOW (ref >60)
Glucose, Bld: 122 mg/dL — ABNORMAL HIGH (ref 70–99)
Potassium: 3.7 mmol/L (ref 3.5–5.1)
Sodium: 139 mmol/L (ref 135–145)

## 2022-05-10 LAB — GLUCOSE, CAPILLARY
Glucose-Capillary: 114 mg/dL — ABNORMAL HIGH (ref 70–99)
Glucose-Capillary: 154 mg/dL — ABNORMAL HIGH (ref 70–99)
Glucose-Capillary: 180 mg/dL — ABNORMAL HIGH (ref 70–99)
Glucose-Capillary: 142 mg/dL — ABNORMAL HIGH (ref 70–99)

## 2022-05-10 LAB — CBC
HCT: 31.3 % — ABNORMAL LOW (ref 39.0–52.0)
Hemoglobin: 9.7 g/dL — ABNORMAL LOW (ref 13.0–17.0)
MCH: 27.2 pg (ref 26.0–34.0)
MCHC: 31 g/dL (ref 30.0–36.0)
MCV: 87.9 fL (ref 80.0–100.0)
Platelets: 200 10*3/uL (ref 150–400)
RBC: 3.56 MIL/uL — ABNORMAL LOW (ref 4.22–5.81)
RDW: 16.2 % — ABNORMAL HIGH (ref 11.5–15.5)
WBC: 8.7 10*3/uL (ref 4.0–10.5)
nRBC: 0 % (ref 0.0–0.2)

## 2022-05-10 LAB — MAGNESIUM: Magnesium: 2.1 mg/dL (ref 1.7–2.4)

## 2022-05-10 LAB — PROTIME-INR
INR: 2.9 — ABNORMAL HIGH (ref 0.8–1.2)
Prothrombin Time: 30.4 seconds — ABNORMAL HIGH (ref 11.4–15.2)

## 2022-05-10 MED ORDER — SODIUM CHLORIDE 0.9 % IR SOLN: 3000.0000 mL | Status: DC

## 2022-05-10 MED ORDER — PHYTONADIONE 5 MG PO TABS
2.5000 mg | ORAL_TABLET | Freq: Once | ORAL | Status: AC
  Administered 2022-05-10: 2.5 mg via ORAL
  Filled 2022-05-10: qty 1

## 2022-05-10 NOTE — Progress Notes (Signed)
[place] for Warfarin Indication: atrial fibrillation  Allergies  Allergen Reactions   Niacin Itching and Rash    Burning sensation   Contrast Media [Iodinated Contrast Media] Nausea And Vomiting   Iodine-131 Nausea And Vomiting   Nsaids Other (See Comments)    Taking Coumadin    Patient Measurements: Height: '5\' 10"'$  (177.8 cm) Weight: 98.5 kg (217 lb 2.5 oz) IBW/kg (Calculated) : 73  Vital Signs: Temp: 98.3 F (36.8 C) (02/08 0337) BP: 156/67 (02/08 0337) Pulse Rate: 59 (02/08 0337)  Labs: Recent Labs    05/08/22 0414 05/09/22 0451 05/10/22 0329  HGB 9.1* 8.8* 9.7*  HCT 30.0* 29.4* 31.3*  PLT 153 160 200  LABPROT 33.5* 35.3* 30.4*  INR 3.3* 3.6* 2.9*  CREATININE 1.26* 1.24 1.28*     Estimated Creatinine Clearance: 55.1 mL/min (A) (by C-G formula based on SCr of 1.28 mg/dL (H)).   Medical History: Past Medical History:  Diagnosis Date   AAA (abdominal aortic aneurysm) ([place])    Followed by Dr. Sherren Mocha Early   Arthritis    CAD (coronary artery disease)    a. CABG 2000.   Cancer [facility])    Prostate:  Radiation Tx   Carotid artery disease ([place])    Chest pain    precordial. mild chronic .[name]... nonischemic   CHF (congestive heart failure) (HCC)    Chronic edema    Coronary artery disease    a. Nuclear, January, 2008, no ischemia b. Cath 08/2012- 1/4 patent grafts, RCA CTO, no flow-limiting disease, medically managed   Diabetes mellitus without complication ([place])    Dizziness 02/2011   Dyslipidemia    Fall    GERD (gastroesophageal reflux disease)    TAKES TUMS & ROLAIDS AS NEEDED   Heart murmur    Hx of CABG 2000   Hypertension    Myocardial infarction ([place]) 1999   Neck pain 02/2011   Pneumonia    Pulmonary hypertension ([place])    Renal artery stenosis (HCC)    50-70%   S/P femoropopliteal bypass surgery    Dr. Donnetta Hutching   Sinus bradycardia    Asymptomatic    Assessment: Presented with chief complaint of right  testicular pain. He has had urinary issues recently  and was passing blood clots. He is chronically anticoagulated for afib with warfarin. Patient told me he tries to keep INR around 2.5-3. Likely with antibiotics starting for presumed orchitis/epipidiymitis would anticipate INR increasing so may need to adjust dose. Pharmacy asked to dose Warfarin.   INR down to 2.9 this morning after 2.'5mg'$  of vitamin k given yesterday. Hemoglobin now trending up at 9.7. Gross hematuria on/off since foley placed with clots noted per primary. Discussed with team and will hold warfarin another day to allow hematuria to improve. Will follow up in am  Home dose is '2mg'$  daily  Goal of Therapy:  INR 2-3 Monitor platelets by anticoagulation protocol: Yes   Plan:  Hold warfarin another day Follow up bleeding/hematuria in am PT-INR daily  Erin Hearing PharmD., BCPS Clinical Pharmacist 05/10/2022 8:37 AM

## 2022-05-10 NOTE — Progress Notes (Signed)
S:  2/8/24[name] had a CT that showed some inflammatory changes in the penoscrotal area and there was a concern for early Fornier's, but on my exam today he just appears to have cellulitis with right testicular tenderness.   He has persistent hematuria with some clots but good UOP with the purewick and a stable Cr.  He would like to avoid a foley and I think he just needs to get his INR normalized to see if the bleeding will stop with the antibiotic change to cefipime.  He will get a bladder scan today.    05/09/22: I was reconsulted on [place].  His urine culture grew Pseudomonas and his antibiotics are being adjusted appropriately.  CT scan of the abdomen and pelvis was wisely obtained to rule out a prostate abscess which did not show an abscess.  There was no air in the tissues.  There was simple penoscrotal edema.  The CT was read as "marked inflammations" and "worried about early Fournier's gangrene". This appropriately concerned Troy [name] who reached out.  Fortunately, clinically the patient is doing well.  His testicle pain is improved.  His white count is normalized.  He had no fever. He has gross hematuria on and off since the Foley was removed but no clots.  He had no clots this week.  He had no trouble voiding.  He has had no painful inability to void.  His hemoglobin is slightly down to 8.8 from 9.1. CT with no clot in bladder.   O: Vitals:   05/09/22 2055 05/10/22 0337  BP: (!) 157/59 (!) 156/67  Pulse: 66 (!) 59  Resp: 20 18  Temp: 97.9 F (36.6 C) 98.3 F (36.8 C)  SpO2: 99% 97%   Gen: WD, WN in NAD. GI: soft obese without suprapubic mass or tenderness. GU: purewick in place.  There is some edema and erythema of the scrotum and base of the penis without crepitus or gangrene to suggest Fornier's.  The right testicle is tender and indurated.   CBC    Component Value Date/Time   WBC 8.7 05/10/2022 0329   RBC 3.56 (L) 05/10/2022 0329   HGB 9.7 (L) 05/10/2022 0329   HGB 13.4 08/23/2021  0934   HCT 31.3 (L) 05/10/2022 0329   HCT 41.7 08/23/2021 0934   PLT 200 05/10/2022 0329   MCV 87.9 05/10/2022 0329   MCV 91 08/23/2021 0934   MCH 27.2 05/10/2022 0329   MCHC 31.0 05/10/2022 0329   RDW 16.2 (H) 05/10/2022 0329   RDW 14.8 08/23/2021 0934   LYMPHSABS 0.6 (L) 05/06/2022 1047   LYMPHSABS 1.5 08/23/2021 0934   MONOABS 1.3 (H) 05/06/2022 1047   EOSABS 0.0 05/06/2022 1047   EOSABS 0.2 08/23/2021 0934   BASOSABS 0.1 05/06/2022 1047   BASOSABS 0.1 08/23/2021 0934   Lab Results  Component Value Date   CREATININE 1.28 (H) 05/10/2022   CREATININE 1.24 05/09/2022   CREATININE 1.26 (H) 05/08/2022      Component 3 d ago  Specimen Description URINE, CLEAN CATCH Performed at [facility], [address] [place], [place] [address]  Special Requests NONE Performed at [facility], [address] [place], [place] [address]  Culture >=100,000 COLONIES/mL PSEUDOMONAS AERUGINOSA      CT abdomen and pelvis-images reviewed.  Penoscrotal edema.  No fluid collections or air.  No clot in bladder.  Bladder not significantly distended.  Assessment/plan:  Right orchitis- He has associated cellulitis but nothing surgical at this time.  Hopefully now  that he is on cefipime for the pseudomonas the process will improve.   Gross hematuria-this is likely from hemorrhagic cystitis which could be a combination of infectious and postradiation but it is aggravated by an elevated INR.  Continue to follow INR and hold warfarin for now.   Appreciate excellent TRH care.  I will be unavailable the next few days, but please page GU on-call with any questions, concerns or changes in patient's status.Patient ID: Troy [name], male   DOB: 03-Apr-1940, 82 y.o.   MRN: [id number]

## 2022-05-10 NOTE — Progress Notes (Addendum)
ID PROGRESS NOTE  Patient remains afebrile, less blood clots reported in urine. Seen by dr Jeffie Pollock today who ordered bladder scan. Tolerating cefepime  81yoM with orchitis and PsA UTI Continue with cefepime, anticipate he will need a total of 7 days for complicated uti.\   Diagnosis: Complicated uti/orchitis  Culture Result: pseudomonas  Allergies  Allergen Reactions   Niacin Itching and Rash    Burning sensation   Contrast Media [Iodinated Contrast Media] Nausea And Vomiting   Iodine-131 Nausea And Vomiting   Nsaids Other (See Comments)    Taking Coumadin    OPAT Orders Discharge antibiotics to be given via PICC line Discharge antibiotics: Per pharmacy protocol cefepime  Duration: 7 days  End Date: 05/15/2022- last day of abtx,   [facility] Care Per Protocol:  Home health RN for IV administration and teaching; PICC line care and labs.    Labs not needed since < 7 days of abtx    _x_ Please pull midline at completion of IV antibiotics

## 2022-05-10 NOTE — Progress Notes (Addendum)
PROGRESS NOTE    Troy [name]  WPY:[id number] DOB: February 11, 1941 DOA: 05/06/2022 PCP: Sharilyn Sites, MD   Brief Narrative:    Troy [name] is a 82 year old male with extensive history of HLD, CABG, CAD, DM 2, HTN, A-fib (on Coumadin), chronic anemia, epididymoorchitis .[name]... Presented with chief complaint of right testicular pain.  He had ultrasound of the scrotum 2/4 with findings of epididymitis and orchitis and was seen by urology 2/5 with plans to continue IV antibiotics for now.  Urine cultures positive for Pseudomonas and ID recommending switch to cefepime and pelvic CT performed on 2/7 with some concerning findings which was reviewed by urology with no significant concerns noted.  He continues to have some hematuria with blood clots that he is passing.  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 ----Pulmonary HTN    Debility  Assessment and Plan:  Orchitis and epididymitis with Pseudomonas infection -Appreciate ID evaluation with recommendation to switch to cefepime 2/7 anticipate discharge on oral antibiotics soon -Pelvic CT with concerning findings of Fournier's gangrene which was reviewed by urology with no concerns noted -Leukocytosis improved, monitor CBC -Blood cultures with no growth -Trial of CBI today     Acute urinary obstruction-resolved -History of urinary retention, - Voiding now, but has mild hematuria and some blood clots-try CBI today   Chronic heart failure with preserved ejection fraction (HFpEF) (HCC) -Will continue home medications including losartan,  Aldactone -Hold torsemide today due to creatinine elevation   Atrial fibrillation (HCC) -Stable  continuing rate control medications including Toprol-XL, and Coumadin -INR therapeutic   Type 2 diabetes mellitus (HCC) -Currently on metformin, will hold for now -Checking CBG before every meal/with SSI coverage -Last A1c October 2023 6.9 -Rechecking A1c 6.9   --Severe pulmonary HTN, 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 ----Pulmonary HTN  -stable  -History of pulmonary hypertension -Monitoring closely   CORONARY ARTERY BYPASS GRAFT, HX OF - Continue current medication including: Imdur, statins, losartan, Toprol, Coumadin   Hyperlipidemia -Continue statins   Debility -PT/OT consulted-status post evaluation-recommending SNF, but patient wants to go home with home health services  Obesity -BMI 31.16  DVT prophylaxis:Warfarin Code Status: Full Family Communication: None at bedside Disposition Plan:  Status is: Inpatient Remains inpatient appropriate because: Need for IV medications  Consultants:  Urology ID  Procedures:  None  Antimicrobials:  Anti-infectives (From admission, onward)    Start     Dose/Rate Route Frequency Ordered Stop   05/09/22 1600  ceFEPIme (MAXIPIME) 2 g in sodium chloride 0.9 % 100 mL IVPB        2 g 200 mL/hr over 30 Minutes Intravenous Every 8 hours 05/09/22 1203     05/06/22 2200  Ampicillin-Sulbactam (UNASYN) 3 g in sodium chloride 0.9 % 100 mL IVPB  Status:  Discontinued        3 g 200 mL/hr over 30 Minutes Intravenous Every 8 hours 05/06/22 1531 05/06/22 1552   05/06/22 2200  Ampicillin-Sulbactam (UNASYN) 3 g in sodium chloride 0.9 % 100 mL IVPB  Status:  Discontinued        3 g 200 mL/hr over 30 Minutes Intravenous Every 6 hours 05/06/22 1552 05/09/22 1203   05/06/22  1500  cefTRIAXone (ROCEPHIN) injection 1 g  Status:  Discontinued        1 g Intramuscular Every 24 hours 05/06/22 1448 05/06/22 1521   05/06/22 1500  doxycycline (VIBRAMYCIN) capsule 100 mg  Status:  Discontinued        100 mg Oral Every 12 hours 05/06/22 1452 05/06/22 1521   05/06/22 1245   levofloxacin (LEVAQUIN) IVPB 500 mg        500 mg 100 mL/hr over 60 Minutes Intravenous  Once 05/06/22 1238 05/06/22 1435      Subjective: Patient seen and evaluated today with ongoing complaints of right testicular pain noted.  No fever overnight.  He continues to have some significant pain today as well as hematuria and blood clots.  Objective: Vitals:   05/09/22 1454 05/09/22 2055 05/10/22 0337 05/10/22 0342  BP: (!) 142/64 (!) 157/59 (!) 156/67   Pulse: (!) 48 66 (!) 59   Resp: '20 20 18   '$ Temp: (!) 97.5 F (36.4 C) 97.9 F (36.6 C) 98.3 F (36.8 C)   TempSrc: Oral     SpO2: 98% 99% 97%   Weight:    98.5 kg  Height:    '5\' 10"'$  (1.778 m)    Intake/Output Summary (Last 24 hours) at 05/10/2022 1010 Last data filed at 05/10/2022 0500 Gross per 24 hour  Intake 1899.85 ml  Output 2850 ml  Net -950.15 ml   Filed Weights   05/08/22 0429 05/09/22 0500 05/10/22 0342  Weight: 94.4 kg 98.5 kg 98.5 kg    Examination:  General exam: Appears calm and comfortable  Respiratory system: Clear to auscultation. Respiratory effort normal. Cardiovascular system: S1 & S2 heard, RRR.  Gastrointestinal system: Abdomen is soft Central nervous system: Alert and awake Extremities: No edema Skin: No significant lesions noted Psychiatry: Flat affect. Male with catheter with ongoing hematuria and blood clots    Data Reviewed: I have personally reviewed following labs and imaging studies  CBC: Recent Labs  Lab 05/06/22 1047 05/07/22 0433 05/08/22 0414 05/09/22 0451 05/10/22 0329  WBC 17.1* 25.9* 17.9* 10.4 8.7  NEUTROABS 14.9*  --   --   --   --   HGB 11.7* 9.6* 9.1* 8.8* 9.7*  HCT 35.7* 31.6* 30.0* 29.4* 31.3*  MCV 86.2 88.8 89.3 88.6 87.9  PLT 194 179 153 160 768   Basic Metabolic Panel: Recent Labs  Lab 05/06/22 1047 05/06/22 1446 05/07/22 0433 05/08/22 0414 05/09/22 0451 05/10/22 0329  NA 137  --  134* 134* 137 139  K 3.8  --  3.6 3.5 3.5 3.7  CL 101  --  101 102 102  104  CO2 25  --  '24 24 25 25  '$ GLUCOSE 201*  --  144* 126* 142* 122*  BUN 22  --  19 25* 32* 33*  CREATININE 1.08  --  0.99 1.26* 1.24 1.28*  CALCIUM 8.8*  --  7.7* 7.3* 7.3* 7.7*  MG 1.2*  --   --   --   --  2.1  PHOS  --  1.6*  --   --   --   --    GFR: Estimated Creatinine Clearance: 55.1 mL/min (A) (by C-G formula based on SCr of 1.28 mg/dL (H)). Liver Function Tests: Recent Labs  Lab 05/06/22 1047  AST 29  ALT 11  ALKPHOS 75  BILITOT 0.8  PROT 7.2  ALBUMIN 3.6   No results for input(s): "LIPASE", "AMYLASE" in the last 168 hours. No results for  input(s): "AMMONIA" in the last 168 hours. Coagulation Profile: Recent Labs  Lab 05/06/22 1047 05/07/22 0433 05/08/22 0414 05/09/22 0451 05/10/22 0329  INR 3.0* 2.9* 3.3* 3.6* 2.9*   Cardiac Enzymes: No results for input(s): "CKTOTAL", "CKMB", "CKMBINDEX", "TROPONINI" in the last 168 hours. BNP (last 3 results) No results for input(s): "PROBNP" in the last 8760 hours. HbA1C: No results for input(s): "HGBA1C" in the last 72 hours.  CBG: Recent Labs  Lab 05/09/22 0807 05/09/22 1220 05/09/22 1620 05/09/22 2058 05/10/22 0731  GLUCAP 121* 138* 149* 159* 114*   Lipid Profile: No results for input(s): "CHOL", "HDL", "LDLCALC", "TRIG", "CHOLHDL", "LDLDIRECT" in the last 72 hours. Thyroid Function Tests: No results for input(s): "TSH", "T4TOTAL", "FREET4", "T3FREE", "THYROIDAB" in the last 72 hours. Anemia Panel: Recent Labs    05/09/22 1512  VITAMINB12 210  FOLATE 16.2  FERRITIN 93  TIBC 287  IRON 15*  RETICCTPCT 1.4   Sepsis Labs: No results for input(s): "PROCALCITON", "LATICACIDVEN" in the last 168 hours.  Recent Results (from the past 240 hour(s))  Remove urinary catheter to obtain Clean Catch urine culture     Status: Abnormal   Collection Time: 05/06/22 11:53 AM   Specimen: Urine, Clean Catch  Result Value Ref Range Status   Specimen Description   Final    URINE, CLEAN CATCH Performed at [facility], [address] [place], [place] [address]    Special Requests   Final    NONE Performed at [facility], [address], [place], [place] [address]    Culture >=100,000 COLONIES/mL PSEUDOMONAS AERUGINOSA (A)  Final   Report Status 05/09/2022 FINAL  Final   Organism ID, Bacteria PSEUDOMONAS AERUGINOSA (A)  Final      Susceptibility   Pseudomonas aeruginosa - MIC*    CEFTAZIDIME 4 SENSITIVE Sensitive     CIPROFLOXACIN 1 INTERMEDIATE Intermediate     GENTAMICIN <=1 SENSITIVE Sensitive     IMIPENEM 1 SENSITIVE Sensitive     PIP/TAZO <=4 SENSITIVE Sensitive     CEFEPIME 2 SENSITIVE Sensitive     * >=100,000 COLONIES/mL PSEUDOMONAS AERUGINOSA  Culture, blood (Routine X 2) w Reflex to ID Panel     Status: None (Preliminary result)   Collection Time: 05/06/22  2:48 PM   Specimen: BLOOD LEFT ARM  Result Value Ref Range Status   Specimen Description BLOOD LEFT ARM BOTTLES DRAWN AEROBIC AND ANAEROBIC  Final   Special Requests Blood Culture adequate volume  Final   Culture   Final    NO GROWTH 4 DAYS Performed at [facility], [address] [place], [place] [address]    Report Status PENDING  Incomplete  Culture, blood (Routine X 2) w Reflex to ID Panel     Status: None (Preliminary result)   Collection Time: 05/06/22  2:53 PM   Specimen: BLOOD RIGHT ARM  Result Value Ref Range Status   Specimen Description   Final    BLOOD RIGHT ARM BOTTLES DRAWN AEROBIC AND ANAEROBIC   Special Requests Blood Culture adequate volume  Final   Culture   Final    NO GROWTH 4 DAYS Performed at [facility], [address] [place], [place] [address]    Report Status PENDING  Incomplete         Radiology Studies: CT PELVIS WO CONTRAST  Result Date: 05/09/2022 CLINICAL DATA:  Soft tissue infection suspected. EXAM: CT PELVIS WITHOUT CONTRAST TECHNIQUE: Multidetector CT imaging of the pelvis was performed following the standard protocol without intravenous  contrast. RADIATION DOSE REDUCTION: This exam  was performed according to the departmental dose-optimization program which includes automated exposure control, adjustment of the mA and/or kV according to patient size and/or use of iterative reconstruction technique. COMPARISON:  Scrotal ultrasound 05/06/2022 FINDINGS: Urinary Tract: The bladder is unremarkable. No bladder mass or calculi. Bowel: The rectum, sigmoid colon visualized small-bowel loops are grossly normal. There is a masslike intraluminal lesion involving the ascending colon just above the ileocecal valve. It is possible this is succus entericus from the small but could not exclude a mass. Recommend correlation with colon cancer screening history. Vascular/Lymphatic: Stable 5 cm infrarenal abdominal aortic aneurysm just above the iliac artery bifurcation. 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 Advanced iliac artery atherosclerotic calcifications. Recommend referral to a vascular specialist. This recommendation follows ACR consensus guidelines: White Paper of the ACR Incidental Findings Committee II on Vascular Findings. J Am Coll Radiol 2013; 10:789-794. Reproductive: Brachytherapy seeds noted throughout the prostate gland. The seminal vesicles are unremarkable. Marked inflammations/edema involving the penis and scrotum. I do not see any obvious gas in the soft tissues but would be worried about early Fourniers gangrene. Recommend close clinical observation. Other: No free pelvic fluid. Small periumbilical abdominal wall hernia containing fat. There is a 3.5 cm subcutaneous lesion involving the anterolateral left thigh. Indeterminate finding. Recommend clinical correlation. If this is firm and fixed it may require biopsy. Ultrasound could be helpful. Musculoskeletal: No significant bony findings. IMPRESSION: 1. Marked inflammations/edema involving the penis and scrotum. I do not see any obvious gas in the soft tissues but would be worried about early Fournier's  gangrene. Recommend close clinical observation. 2. Stable 5 cm infrarenal abdominal aortic aneurysm just above the iliac artery bifurcation. 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 3. 3.5 cm subcutaneous lesion involving the anterolateral left thigh. Indeterminate finding. If this is firm and fixed it may require biopsy. Ultrasound could be helpful. 4. Masslike intraluminal lesion involving the ascending colon just above the ileocecal valve. It is possible this is succus entericus from the small but could not exclude a mass. Recommend correlation with colon cancer screening history. These results will be called to the ordering clinician or representative by the Radiologist Assistant, and communication documented in the PACS or [employer]. Electronically Signed   By: Marijo Sanes M.D.   On: 05/09/2022 16:00        Scheduled Meds:  acidophilus  2 capsule Oral TID   atorvastatin  40 mg Oral Daily   dutasteride  0.5 mg Oral Daily   ibuprofen  600 mg Oral TID   insulin aspart  0-9 Units Subcutaneous TID WC   isosorbide mononitrate  120 mg Oral QPM   latanoprost  1 drop Both Eyes QHS   losartan  25 mg Oral Daily   metoprolol succinate  12.5 mg Oral Daily   pantoprazole  40 mg Oral Daily   sodium chloride flush  3 mL Intravenous Q12H   sodium chloride flush  3 mL Intravenous Q12H   tamsulosin  0.4 mg Oral QPC supper   torsemide  20 mg Oral Daily   Warfarin - Pharmacist Dosing Inpatient   Does not apply q1600   Continuous Infusions:  sodium chloride     ceFEPime (MAXIPIME) IV 2 g (05/10/22 0818)   ferric gluconate (FERRLECIT) IVPB 250 mg (05/09/22 1749)     LOS: 4 days    Time spent:  65 minutes    Gerrie Castiglia Darleen Crocker, DO [facility]  If 7PM-7AM, please contact night-coverage [email or web address] 05/10/2022, 10:10 AM

## 2022-05-10 NOTE — Progress Notes (Signed)
Patient slept on and off this shift. Heart rate sinus brady all night. Continued to monitor.

## 2022-05-11 ENCOUNTER — Ambulatory Visit: Payer: Medicare HMO | Admitting: Urology

## 2022-05-11 ENCOUNTER — Telehealth: Payer: Self-pay

## 2022-05-11 ENCOUNTER — Telehealth (HOSPITAL_COMMUNITY): Payer: Self-pay | Admitting: *Deleted

## 2022-05-11 DIAGNOSIS — N453 Epididymo-orchitis: Secondary | ICD-10-CM | POA: Diagnosis not present

## 2022-05-11 LAB — BASIC METABOLIC PANEL
Anion gap: 9 (ref 5–15)
BUN: 25 mg/dL — ABNORMAL HIGH (ref 8–23)
CO2: 26 mmol/L (ref 22–32)
Calcium: 8.2 mg/dL — ABNORMAL LOW (ref 8.9–10.3)
Chloride: 106 mmol/L (ref 98–111)
Creatinine, Ser: 1 mg/dL (ref 0.61–1.24)
GFR, Estimated: >60 mL/min (ref >60)
Glucose, Bld: 94 mg/dL (ref 70–99)
Potassium: 3.7 mmol/L (ref 3.5–5.1)
Sodium: 141 mmol/L (ref 135–145)

## 2022-05-11 LAB — CBC
HCT: 29.8 % — ABNORMAL LOW (ref 39.0–52.0)
Hemoglobin: 9.1 g/dL — ABNORMAL LOW (ref 13.0–17.0)
MCH: 26.9 pg (ref 26.0–34.0)
MCHC: 30.5 g/dL (ref 30.0–36.0)
MCV: 88.2 fL (ref 80.0–100.0)
Platelets: 200 10*3/uL (ref 150–400)
RBC: 3.38 MIL/uL — ABNORMAL LOW (ref 4.22–5.81)
RDW: 16.2 % — ABNORMAL HIGH (ref 11.5–15.5)
WBC: 9.4 10*3/uL (ref 4.0–10.5)
nRBC: 0.2 % (ref 0.0–0.2)

## 2022-05-11 LAB — PROTIME-INR
INR: 1.6 — ABNORMAL HIGH (ref 0.8–1.2)
Prothrombin Time: 19.2 seconds — ABNORMAL HIGH (ref 11.4–15.2)

## 2022-05-11 LAB — MAGNESIUM: Magnesium: 1.8 mg/dL (ref 1.7–2.4)

## 2022-05-11 LAB — GLUCOSE, CAPILLARY
Glucose-Capillary: 107 mg/dL — ABNORMAL HIGH (ref 70–99)
Glucose-Capillary: 122 mg/dL — ABNORMAL HIGH (ref 70–99)
Glucose-Capillary: 119 mg/dL — ABNORMAL HIGH (ref 70–99)

## 2022-05-11 MED ORDER — CEFEPIME IV (FOR PTA / DISCHARGE USE ONLY): 2.0000 g | Freq: Three times a day (TID) | INTRAVENOUS | 0 refills | Status: AC

## 2022-05-11 MED ORDER — WARFARIN SODIUM 2 MG PO TABS
2.0000 mg | ORAL_TABLET | Freq: Once | ORAL | Status: AC
  Administered 2022-05-11: 2 mg via ORAL
  Filled 2022-05-11: qty 1

## 2022-05-11 MED ORDER — SODIUM CHLORIDE 0.9% FLUSH
10.0000 mL | Freq: Two times a day (BID) | INTRAVENOUS | Status: DC
  Administered 2022-05-11: 10 mL

## 2022-05-11 MED ORDER — RISAQUAD PO CAPS: 2.0000 | ORAL_CAPSULE | Freq: Three times a day (TID) | ORAL | 0 refills | Status: AC

## 2022-05-11 MED ORDER — SODIUM CHLORIDE 0.9% FLUSH: 10.0000 mL | INTRAVENOUS | Status: DC | PRN

## 2022-05-11 NOTE — TOC Progression Note (Signed)
Transition of Care [facility]) - Progression Note    Patient Details  Name: Troy [name] MRN: [id number] Date of Birth: 10-05-1940  Transition of Care [facility]) CM/SW Contact  Ihor Gully, LCSW Phone Number: 05/11/2022, 11:26 AM  Clinical Narrative:    Daughter, Lattie Haw, agreeable to assist patient with IV abx. Pam with [facility] is scheduled to provide teaching to [place] today. Pam has also consulted with the [place] regarding auth for infusion.    Expected Discharge Plan: [employer] Barriers to Discharge: Continued Medical Work up  Expected Discharge Plan and Services In-house Referral: Clinical Social Work   Post Acute Care Choice: Home Health, Resumption of Svcs/PTA Provider Living arrangements for the past 2 months: Single Family Home Expected Discharge Date: 05/11/22                         HH Arranged: PT, OT [place] Agency: [place] ([place]) Date [place]: 05/09/22   Representative spoke with at [place]: [place] ([place]) Interventions SDOH Screenings   Food Insecurity: No Food Insecurity (05/08/2022)  Housing: Low Risk  (05/08/2022)  Transportation Needs: No Transportation Needs (05/08/2022)  Utilities: Not At Risk (05/08/2022)  Tobacco Use: Medium Risk (05/06/2022)    Readmission Risk Interventions    05/07/2022   11:28 AM 11/07/2021   12:48 PM  Readmission Risk Prevention Plan  Transportation Screening Complete Complete  Home Care Screening  Complete  Medication Review (RN CM)  Complete  Medication Review Press photographer) Complete   HRI or Home Care Consult Complete   SW Recovery Care/Counseling Consult Complete   Palliative Care Screening Not Applicable   Skilled Nursing Facility Complete

## 2022-05-11 NOTE — Progress Notes (Signed)
PHARMACY CONSULT NOTE FOR:  OUTPATIENT  PARENTERAL ANTIBIOTIC THERAPY (OPAT)  Indication: Pseudmonas UTI Regimen: cefepime 2g IV q8h End date: 05/15/2022  IV antibiotic discharge orders are pended. To discharging provider:  please sign these orders via discharge navigator,  Select New Orders & click on the button choice - Manage This Unsigned Work.     Thank you for allowing pharmacy to be a part of this patient's care.  Candie Mile 05/11/2022, 9:49 AM

## 2022-05-11 NOTE — Telephone Encounter (Signed)
-----   Message from Cleon Gustin, MD sent at 05/01/2022 10:31 AM EST ----- negative ----- Message ----- From: Iris Pert, LPN Sent: 05/16/863  12:46 PM EST To: Cleon Gustin, MD  Please review

## 2022-05-11 NOTE — Discharge Summary (Signed)
Physician Discharge Summary  Troy [name] [id number] DOB: [birth year] DOA: 05/06/2022  PCP: Sharilyn Sites, MD  Admit date: 05/06/2022  Discharge date: 05/11/2022  Admitted From:Home  Disposition:  Home  Recommendations for Outpatient Follow-up:  Follow up with PCP in 1-2 weeks Follow-up with urology in the next couple weeks which will be scheduled per their clinic for epididymitis/orchitis, referral has been sent Continue on IV cefepime as set up with midline catheter placed and home infusions will be given for 4 more days Continue other home medications as noted below  Home Health: Yes with PT/OT/RN/aide  Equipment/Devices: Will go home with midline catheter  Discharge Condition:Stable  CODE STATUS: Full  Diet recommendation: Heart Healthy/carb modified  Brief/Interim Summary:  Troy [name] is a 82 year old male with extensive history of HLD, CABG, CAD, DM 2, HTN, A-fib (on Coumadin), chronic anemia, epididymoorchitis .[name]... Presented with chief complaint of right testicular pain.  He had ultrasound of the scrotum 2/4 with findings of epididymitis and orchitis and was seen by urology 2/5 with plans to continue IV antibiotics for now.  Urine cultures positive for Pseudomonas and ID recommending switch to cefepime and pelvic CT performed on 2/7 with some concerning findings which was reviewed by urology with no significant concerns noted.  He continued to have some ongoing hematuria and blood clots in the setting of therapeutic INR levels on warfarin and therefore he was warfarin was reversed to allow for the hematuria to clear.  His INR is currently subtherapeutic, but he is now okay to resume his warfarin as usual as his hematuria has resolved.  He is otherwise in stable condition for discharge with home IV antibiotics as he will require another 4 days of cefepime to complete total 7-day course of treatment.  Midline will be placed prior to discharge and he will be set up with home  health services.  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 ----Pulmonary HTN    Debility  Principal discharge diagnosis: Orchitis and epididymitis with Pseudomonas infection.  Discharge Instructions  Discharge Instructions     [facility] pharmacist to adjust dose for Vancomycin, Aminoglycosides and other anti-infective therapies as requested by physician.   Complete by: As directed    [facility] to provide Cath Flo 70m   Complete by: As directed    Administer for PICC line occlusion and as ordered by physician for other access device issues.   Ambulatory referral to Urology   Complete by: As directed    Anaphylaxis Kit: Provided to treat any anaphylactic reaction to the medication being provided to the patient if First Dose or when requested by physician   Complete by: As directed    Epinephrine 166mml vial / amp: Administer 0.28m69m0.28ml11mubcutaneously once for moderate to severe anaphylaxis, nurse to call physician and pharmacy when reaction occurs and call 911 if needed for immediate care   Diphenhydramine 50mg18mIV vial: Administer 25-50mg 10mM PRN for first dose reaction, rash, itching, mild reaction, nurse to call physician and pharmacy when reaction occurs   Sodium Chloride 0.9% NS 500ml I7mdminister if needed for hypovolemic blood pressure drop or as ordered by physician after call to physician with  anaphylactic reaction   Change dressing on IV access line weekly and PRN   Complete by: As directed    Flush IV access with Sodium Chloride 0.9% and Heparin 10 units/ml or 100 units/ml   Complete by: As directed    Home infusion instructions - [facility]   Complete by: As directed    Instructions: Flush IV access with Sodium Chloride 0.9% and Heparin 10units/ml or 100units/ml   Change dressing on IV access line: Weekly and PRN   Instructions Cath Flo 49m: Administer for PICC Line occlusion and as ordered by physician for other access device   [facility] pharmacist to adjust dose for: Vancomycin, Aminoglycosides and other anti-infective therapies as requested by physician   Method of administration may be changed at the discretion of home infusion pharmacist based upon assessment of the patient and/or caregiver's ability to self-administer the medication ordered   Complete by: As directed    Outpatient Parenteral Antibiotic Therapy Information Antibiotic: Cefepime (Maxipime) IVPB; Indications for use: pseudmonas UTI; End Date: 05/15/2022   Complete by: As directed    Antibiotic: Cefepime (Maxipime) IVPB   Indications for use: pseudmonas UTI   End Date: 05/15/2022      Allergies as of 05/11/2022       Reactions   Niacin Itching, Rash   Burning sensation   Contrast Media [iodinated Contrast Media] Nausea And Vomiting   Iodine-131 Nausea And Vomiting   Nsaids Other (See Comments)   Taking Coumadin        Medication List     STOP taking these medications    doxycycline 100 MG capsule Commonly known as: VIBRAMYCIN       TAKE these medications    acetaminophen 325 MG tablet Commonly known as: TYLENOL Take 2 tablets (650 mg total) by mouth every 6 (six) hours as needed for mild pain, fever or headache (or Fever >/= 101). What changed: how much to take   acidophilus Caps capsule Take 2 capsules by mouth 3 (three) times daily.   albuterol 108 (90 Base) MCG/ACT inhaler Commonly known as: VENTOLIN HFA Inhale 2 puffs into the lungs every 6 (six) hours as needed for wheezing or shortness of breath.   aspirin EC 81 MG tablet Take 1 tablet (81 mg total) by mouth daily with breakfast.   atorvastatin 40 MG  tablet Commonly known as: LIPITOR Take 1 tablet (40 mg total) by mouth daily.   ceFEPime  IVPB Commonly known as: MAXIPIME Inject 2 g into the vein every 8 (eight) hours for 4 days. Indication:  pseudmonas UTI First Dose: No Last Day of Therapy:  05/15/2022 Labs - Once weekly:  CBC/D and BMP, Labs - Every other week:  ESR and CRP Method of administration: IV Push Method of administration may be changed at the discretion of home infusion pharmacist based upon assessment of the patient and/or caregiver's ability to self-administer the medication ordered.   dutasteride 0.5 MG capsule Commonly known as: AVODART Take 1 capsule (0.5 mg total) by mouth daily.   HYDROcodone-acetaminophen 10-325 MG tablet Commonly known as: NORCO Take 1 tablet by mouth 2 (two) times daily as needed for moderate pain.   ipratropium 0.03 % nasal spray Commonly known as: ATROVENT Place 2 sprays into both nostrils as needed for rhinitis.   isosorbide mononitrate 60 MG 24 hr tablet Commonly known as: IMDUR Take 2 tablets (120 mg total) by mouth every evening.   latanoprost 0.005 % ophthalmic solution Commonly known as: XIvin Poot  Place 1 drop into both eyes at bedtime.   losartan 25 MG tablet Commonly known as: COZAAR Take 1 tablet (25 mg total) by mouth daily.   metFORMIN 500 MG tablet Commonly known as: GLUCOPHAGE Take 500 mg by mouth 2 (two) times daily.   metoprolol succinate 25 MG 24 hr tablet Commonly known as: TOPROL-XL Take 1 tablet (25 mg total) by mouth daily. Pt. Needs to make an appt. With Cardiologist in order to receive further refills. Thank You. 1st Attempt. What changed:  how much to take additional instructions   nitroGLYCERIN 0.4 MG SL tablet Commonly known as: NITROSTAT Place 1 tablet (0.4 mg total) under the tongue every 5 (five) minutes x 3 doses as needed for chest pain.   OXYGEN Inhale 3 L into the lungs at bedtime.   pantoprazole 40 MG tablet Commonly known as:  PROTONIX Take 1 tablet (40 mg total) by mouth daily. Take 30-60 min before first meal of the day   spironolactone 25 MG tablet Commonly known as: ALDACTONE Take 1 tablet (25 mg total) by mouth daily.   tamsulosin 0.4 MG Caps capsule Commonly known as: FLOMAX Take 1 capsule (0.4 mg total) by mouth daily after supper.   torsemide 10 MG tablet Commonly known as: DEMADEX Take 10-20 mg by mouth See admin instructions. Take 10 mg daily, may increase to 20 mg as needed for weight gain   Vitamin D (Ergocalciferol) 1.25 MG (50000 UNIT) Caps capsule Commonly known as: DRISDOL Take 1 capsule by mouth every Sunday.   Vitamin D3 50 MCG (2000 UT) capsule Take 2,000 Units by mouth daily.   warfarin 2 MG tablet Commonly known as: COUMADIN Take 2 mg by mouth daily.               Discharge Care Instructions  (From admission, onward)           Start     Ordered   05/11/22 0000  Change dressing on IV access line weekly and PRN  (Home infusion instructions - [facility] )        02$ /09/24 1026            Follow-up Information     Sharilyn Sites, MD. Schedule an appointment as soon as possible for a visit in 1 week(s).   Specialty: Family Medicine Contact information: [address] [place] [id number] [phone]                Allergies  Allergen Reactions   Niacin Itching and Rash    Burning sensation   Contrast Media [Iodinated Contrast Media] Nausea And Vomiting   Iodine-131 Nausea And Vomiting   Nsaids Other (See Comments)    Taking Coumadin    Consultations: Urology ID   Procedures/Studies: CT PELVIS WO CONTRAST  Result Date: 05/09/2022 CLINICAL DATA:  Soft tissue infection suspected. EXAM: CT PELVIS WITHOUT CONTRAST TECHNIQUE: Multidetector CT imaging of the pelvis was performed following the standard protocol without intravenous contrast. RADIATION DOSE REDUCTION: This exam was performed according to the departmental  dose-optimization program which includes automated exposure control, adjustment of the mA and/or kV according to patient size and/or use of iterative reconstruction technique. COMPARISON:  Scrotal ultrasound 05/06/2022 FINDINGS: Urinary Tract: The bladder is unremarkable. No bladder mass or calculi. Bowel: The rectum, sigmoid colon visualized small-bowel loops are grossly normal. There is a masslike intraluminal lesion involving the ascending colon just above the ileocecal valve. It is possible this is succus entericus from the small but  could not exclude a mass. Recommend correlation with colon cancer screening history. Vascular/Lymphatic: Stable 5 cm infrarenal abdominal aortic aneurysm just above the iliac artery bifurcation. 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 Advanced iliac artery atherosclerotic calcifications. Recommend referral to a vascular specialist. This recommendation follows ACR consensus guidelines: White Paper of the ACR Incidental Findings Committee II on Vascular Findings. J Am Coll Radiol 2013; 10:789-794. Reproductive: Brachytherapy seeds noted throughout the prostate gland. The seminal vesicles are unremarkable. Marked inflammations/edema involving the penis and scrotum. I do not see any obvious gas in the soft tissues but would be worried about early Fourniers gangrene. Recommend close clinical observation. Other: No free pelvic fluid. Small periumbilical abdominal wall hernia containing fat. There is a 3.5 cm subcutaneous lesion involving the anterolateral left thigh. Indeterminate finding. Recommend clinical correlation. If this is firm and fixed it may require biopsy. Ultrasound could be helpful. Musculoskeletal: No significant bony findings. IMPRESSION: 1. Marked inflammations/edema involving the penis and scrotum. I do not see any obvious gas in the soft tissues but would be worried about early Fournier's gangrene. Recommend close clinical  observation. 2. Stable 5 cm infrarenal abdominal aortic aneurysm just above the iliac artery bifurcation. 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 3. 3.5 cm subcutaneous lesion involving the anterolateral left thigh. Indeterminate finding. If this is firm and fixed it may require biopsy. Ultrasound could be helpful. 4. Masslike intraluminal lesion involving the ascending colon just above the ileocecal valve. It is possible this is succus entericus from the small but could not exclude a mass. Recommend correlation with colon cancer screening history. These results will be called to the ordering clinician or representative by the Radiologist Assistant, and communication documented in the PACS or [employer]. Electronically Signed   By: Marijo Sanes M.D.   On: 05/09/2022 16:00   US SCROTUM W/DOPPLER  Result Date: 05/06/2022 CLINICAL DATA:  Sudden onset right testicular pain and swelling EXAM: SCROTAL ULTRASOUND DOPPLER ULTRASOUND OF THE TESTICLES TECHNIQUE: Complete ultrasound examination of the testicles, epididymis, and other scrotal structures was performed. Color and spectral Doppler ultrasound were also utilized to evaluate blood flow to the testicles. COMPARISON:  Scrotal ultrasound 01/03/2022 FINDINGS: Right testicle Measurements: 3.2 by 4.6 by 3.1 cm. No mass or microlithiasis visualized. 4 mm peripheral right scrotal cyst possibly along the rete testis. New heterogeneity of the right testis and mild right testicular hyperemia suggesting orchitis. Left testicle Measurements: 4.6 by 2.2 by 3.3 cm. Unchanged 6 mm simple appearing cyst. Ectasia of rete testis unchanged. Right epididymis:  Normal in size and appearance. Left epididymis:  0.7 cm epididymal cyst or spermatocele unchanged. Hydrocele:  None visualized. Varicocele:  None visualized. Pulsed Doppler interrogation of both testes demonstrates normal low resistance arterial and venous waveforms bilaterally.  IMPRESSION: 1. New heterogeneity of the right testis and mild right testicular hyperemia suggesting orchitis. 2. No evidence of testicular torsion. Electronically Signed   By: Van Clines M.D.   On: 05/06/2022 13:10     Discharge Exam: Vitals:   05/10/22 1405 05/10/22 2010  BP: (!) 161/101 (!) 157/65  Pulse: 63 (!) 51  Resp:  18  Temp:  98.2 F (36.8 C)  SpO2: 100% 95%   Vitals:   05/10/22 0342 05/10/22 1405 05/10/22 2010 05/11/22 0500  BP:  (!) 161/101 (!) 157/65   Pulse:  63 (!) 51   Resp:   18   Temp:  98.2 F (36.8 C)   TempSrc:      SpO2:  100% 95%   Weight: 98.5 kg   97.6 kg  Height: 5' 10"$  (1.778 m)       General: Pt is alert, awake, not in acute distress Cardiovascular: RRR, S1/S2 +, no rubs, no gallops Respiratory: CTA bilaterally, no wheezing, no rhonchi Abdominal: Soft, NT, ND, bowel sounds + Extremities: no edema, no cyanosis    The results of significant diagnostics from this hospitalization (including imaging, microbiology, ancillary and laboratory) are listed below for reference.     Microbiology: Recent Results (from the past 240 hour(s))  Remove urinary catheter to obtain Clean Catch urine culture     Status: Abnormal   Collection Time: 05/06/22 11:53 AM   Specimen: Urine, Clean Catch  Result Value Ref Range Status   Specimen Description   Final    URINE, CLEAN CATCH Performed at [facility], [address], [place], [place] [address]    Special Requests   Final    NONE Performed at [facility], [address] [place], [place] [address]    Culture >=100,000 COLONIES/mL PSEUDOMONAS AERUGINOSA (A)  Final   Report Status 05/09/2022 FINAL  Final   Organism ID, Bacteria PSEUDOMONAS AERUGINOSA (A)  Final      Susceptibility   Pseudomonas aeruginosa - MIC*    CEFTAZIDIME 4 SENSITIVE Sensitive     CIPROFLOXACIN 1 INTERMEDIATE Intermediate     GENTAMICIN <=1 SENSITIVE Sensitive     IMIPENEM 1 SENSITIVE Sensitive     PIP/TAZO <=4 SENSITIVE  Sensitive     CEFEPIME 2 SENSITIVE Sensitive     * >=100,000 COLONIES/mL PSEUDOMONAS AERUGINOSA  Culture, blood (Routine X 2) w Reflex to ID Panel     Status: None (Preliminary result)   Collection Time: 05/06/22  2:48 PM   Specimen: BLOOD LEFT ARM  Result Value Ref Range Status   Specimen Description BLOOD LEFT ARM BOTTLES DRAWN AEROBIC AND ANAEROBIC  Final   Special Requests Blood Culture adequate volume  Final   Culture   Final    NO GROWTH 4 DAYS Performed at [facility], [address], [place], [place] [address]    Report Status PENDING  Incomplete  Culture, blood (Routine X 2) w Reflex to ID Panel     Status: None (Preliminary result)   Collection Time: 05/06/22  2:53 PM   Specimen: BLOOD RIGHT ARM  Result Value Ref Range Status   Specimen Description   Final    BLOOD RIGHT ARM BOTTLES DRAWN AEROBIC AND ANAEROBIC   Special Requests Blood Culture adequate volume  Final   Culture   Final    NO GROWTH 4 DAYS Performed at [facility], [address], [place], [place] [address]    Report Status PENDING  Incomplete     Labs: BNP (last 3 results) Recent Labs    11/06/21 1220 11/07/21 0410 11/08/21 0541  BNP 269.0* 167.0* [id number]   Basic Metabolic Panel: Recent Labs  Lab 05/06/22 1047 05/06/22 1446 05/07/22 0433 05/08/22 0414 05/09/22 0451 05/10/22 0329 05/11/22 0419  NA 137  --  134* 134* 137 139 141  K 3.8  --  3.6 3.5 3.5 3.7 3.7  CL 101  --  101 102 102 104 106  CO2 25  --  24 24 25 25 26  $ GLUCOSE 201*  --  144* 126* 142* 122* 94  BUN 22  --  19 25* 32* 33* 25*  CREATININE 1.08  --  0.99 1.26* 1.24 1.28* 1.00  CALCIUM 8.8*  --  7.7* 7.3* 7.3* 7.7* 8.2*  MG 1.2*  --   --   --   --  2.1 1.8  PHOS  --  1.6*  --   --   --   --   --    Liver Function Tests: Recent Labs  Lab 05/06/22 1047  AST 29  ALT 11  ALKPHOS 75  BILITOT 0.8  PROT 7.2  ALBUMIN 3.6   No results for input(s): "LIPASE", "AMYLASE" in the last 168 hours. No results for input(s):  "AMMONIA" in the last 168 hours. CBC: Recent Labs  Lab 05/06/22 1047 05/07/22 0433 05/08/22 0414 05/09/22 0451 05/10/22 0329 05/11/22 0419  WBC 17.1* 25.9* 17.9* 10.4 8.7 9.4  NEUTROABS 14.9*  --   --   --   --   --   HGB 11.7* 9.6* 9.1* 8.8* 9.7* 9.1*  HCT 35.7* 31.6* 30.0* 29.4* 31.3* 29.8*  MCV 86.2 88.8 89.3 88.6 87.9 88.2  PLT 194 179 153 160 200 200   Cardiac Enzymes: No results for input(s): "CKTOTAL", "CKMB", "CKMBINDEX", "TROPONINI" in the last 168 hours. BNP: Invalid input(s): "POCBNP" CBG: Recent Labs  Lab 05/10/22 0731 05/10/22 1129 05/10/22 1717 05/10/22 2010 05/11/22 0734  GLUCAP 114* 154* 180* 142* 107*   D-Dimer No results for input(s): "DDIMER" in the last 72 hours. Hgb A1c No results for input(s): "HGBA1C" in the last 72 hours. Lipid Profile No results for input(s): "CHOL", "HDL", "LDLCALC", "TRIG", "CHOLHDL", "LDLDIRECT" in the last 72 hours. Thyroid function studies No results for input(s): "TSH", "T4TOTAL", "T3FREE", "THYROIDAB" in the last 72 hours.  Invalid input(s): "FREET3" Anemia work up Recent Labs    05/09/22 1512  VITAMINB12 210  FOLATE 16.2  FERRITIN 93  TIBC 287  IRON 15*  RETICCTPCT 1.4   Urinalysis    Component Value Date/Time   COLORURINE YELLOW 05/06/2022 1153   APPEARANCEUR CLEAR 05/06/2022 1153   APPEARANCEUR Cloudy (A) 04/26/2022 1634   LABSPEC 1.013 05/06/2022 1153   PHURINE 9.0 (H) 05/06/2022 1153   GLUCOSEU NEGATIVE 05/06/2022 1153   HGBUR SMALL (A) 05/06/2022 1153   BILIRUBINUR NEGATIVE 05/06/2022 1153   BILIRUBINUR Negative 04/26/2022 1634   KETONESUR NEGATIVE 05/06/2022 1153   PROTEINUR 30 (A) 05/06/2022 1153   NITRITE NEGATIVE 05/06/2022 1153   LEUKOCYTESUR MODERATE (A) 05/06/2022 1153   Sepsis Labs Recent Labs  Lab 05/08/22 0414 05/09/22 0451 05/10/22 0329 05/11/22 0419  WBC 17.9* 10.4 8.7 9.4   Microbiology Recent Results (from the past 240 hour(s))  Remove urinary catheter to obtain Clean  Catch urine culture     Status: Abnormal   Collection Time: 05/06/22 11:53 AM   Specimen: Urine, Clean Catch  Result Value Ref Range Status   Specimen Description   Final    URINE, CLEAN CATCH Performed at [facility], [address], [place], [place] [address]    Special Requests   Final    NONE Performed at [facility], [address], [place], [place] [address]    Culture >=100,000 COLONIES/mL PSEUDOMONAS AERUGINOSA (A)  Final   Report Status 05/09/2022 FINAL  Final   Organism ID, Bacteria PSEUDOMONAS AERUGINOSA (A)  Final      Susceptibility   Pseudomonas aeruginosa - MIC*    CEFTAZIDIME 4 SENSITIVE Sensitive     CIPROFLOXACIN 1 INTERMEDIATE Intermediate     GENTAMICIN <=1 SENSITIVE Sensitive     IMIPENEM 1 SENSITIVE Sensitive     PIP/TAZO <=4  SENSITIVE Sensitive     CEFEPIME 2 SENSITIVE Sensitive     * >=100,000 COLONIES/mL PSEUDOMONAS AERUGINOSA  Culture, blood (Routine X 2) w Reflex to ID Panel     Status: None (Preliminary result)   Collection Time: 05/06/22  2:48 PM   Specimen: BLOOD LEFT ARM  Result Value Ref Range Status   Specimen Description BLOOD LEFT ARM BOTTLES DRAWN AEROBIC AND ANAEROBIC  Final   Special Requests Blood Culture adequate volume  Final   Culture   Final    NO GROWTH 4 DAYS Performed at [facility], [address], [place], [place] [address]    Report Status PENDING  Incomplete  Culture, blood (Routine X 2) w Reflex to ID Panel     Status: None (Preliminary result)   Collection Time: 05/06/22  2:53 PM   Specimen: BLOOD RIGHT ARM  Result Value Ref Range Status   Specimen Description   Final    BLOOD RIGHT ARM BOTTLES DRAWN AEROBIC AND ANAEROBIC   Special Requests Blood Culture adequate volume  Final   Culture   Final    NO GROWTH 4 DAYS Performed at [facility] [address], [place], [place] [address]    Report Status PENDING  Incomplete     Time coordinating discharge: 35 minutes  SIGNED:   Rodena Goldmann, DO [facility] 05/11/2022, 10:32 AM  If 7PM-7AM, please contact night-coverage [email or web address]

## 2022-05-11 NOTE — Progress Notes (Signed)
Nsg Discharge Note  Admit Date:  05/06/2022 Discharge date: 05/11/2022   Troy [name] to be D/C'd Home per MD order.  AVS completed.   Patient/caregiver able to verbalize understanding.  Discharge Medication: Allergies as of 05/11/2022       Reactions   Niacin Itching, Rash   Burning sensation   Contrast Media [iodinated Contrast Media] Nausea And Vomiting   Iodine-131 Nausea And Vomiting   Nsaids Other (See Comments)   Taking Coumadin        Medication List     STOP taking these medications    doxycycline 100 MG capsule Commonly known as: VIBRAMYCIN       TAKE these medications    acetaminophen 325 MG tablet Commonly known as: TYLENOL Take 2 tablets (650 mg total) by mouth every 6 (six) hours as needed for mild pain, fever or headache (or Fever >/= 101). What changed: how much to take   acidophilus Caps capsule Take 2 capsules by mouth 3 (three) times daily.   albuterol 108 (90 Base) MCG/ACT inhaler Commonly known as: VENTOLIN HFA Inhale 2 puffs into the lungs every 6 (six) hours as needed for wheezing or shortness of breath.   aspirin EC 81 MG tablet Take 1 tablet (81 mg total) by mouth daily with breakfast.   atorvastatin 40 MG tablet Commonly known as: LIPITOR Take 1 tablet (40 mg total) by mouth daily.   ceFEPime  IVPB Commonly known as: MAXIPIME Inject 2 g into the vein every 8 (eight) hours for 4 days. Indication:  pseudmonas UTI First Dose: No Last Day of Therapy:  05/15/2022 Labs - Once weekly:  CBC/D and BMP, Labs - Every other week:  ESR and CRP Method of administration: IV Push Method of administration may be changed at the discretion of home infusion pharmacist based upon assessment of the patient and/or caregiver's ability to self-administer the medication ordered.   dutasteride 0.5 MG capsule Commonly known as: AVODART Take 1 capsule (0.5 mg total) by mouth daily.   HYDROcodone-acetaminophen 10-325 MG tablet Commonly known as: NORCO Take  1 tablet by mouth 2 (two) times daily as needed for moderate pain.   ipratropium 0.03 % nasal spray Commonly known as: ATROVENT Place 2 sprays into both nostrils as needed for rhinitis.   isosorbide mononitrate 60 MG 24 hr tablet Commonly known as: IMDUR Take 2 tablets (120 mg total) by mouth every evening.   latanoprost 0.005 % ophthalmic solution Commonly known as: XALATAN Place 1 drop into both eyes at bedtime.   losartan 25 MG tablet Commonly known as: COZAAR Take 1 tablet (25 mg total) by mouth daily.   metFORMIN 500 MG tablet Commonly known as: GLUCOPHAGE Take 500 mg by mouth 2 (two) times daily.   metoprolol succinate 25 MG 24 hr tablet Commonly known as: TOPROL-XL Take 1 tablet (25 mg total) by mouth daily. Pt. Needs to make an appt. With Cardiologist in order to receive further refills. Thank You. 1st Attempt. What changed:  how much to take additional instructions   nitroGLYCERIN 0.4 MG SL tablet Commonly known as: NITROSTAT Place 1 tablet (0.4 mg total) under the tongue every 5 (five) minutes x 3 doses as needed for chest pain.   OXYGEN Inhale 3 L into the lungs at bedtime.   pantoprazole 40 MG tablet Commonly known as: PROTONIX Take 1 tablet (40 mg total) by mouth daily. Take 30-60 min before first meal of the day   spironolactone 25 MG tablet Commonly known as:  ALDACTONE Take 1 tablet (25 mg total) by mouth daily.   tamsulosin 0.4 MG Caps capsule Commonly known as: FLOMAX Take 1 capsule (0.4 mg total) by mouth daily after supper.   torsemide 10 MG tablet Commonly known as: DEMADEX Take 10-20 mg by mouth See admin instructions. Take 10 mg daily, may increase to 20 mg as needed for weight gain   Vitamin D (Ergocalciferol) 1.25 MG (50000 UNIT) Caps capsule Commonly known as: DRISDOL Take 1 capsule by mouth every Sunday.   Vitamin D3 50 MCG (2000 UT) capsule Take 2,000 Units by mouth daily.   warfarin 2 MG tablet Commonly known as: COUMADIN Take 2  mg by mouth daily.               Discharge Care Instructions  (From admission, onward)           Start     Ordered   05/11/22 0000  Change dressing on IV access line weekly and PRN  (Home infusion instructions - [facility] )        02$ /09/24 1026            Discharge Assessment: Vitals:   05/10/22 1405 05/10/22 2010  BP: (!) 161/101 (!) 157/65  Pulse: 63 (!) 51  Resp:  18  Temp:  98.2 F (36.8 C)  SpO2: 100% 95%   Skin clean, dry and intact without evidence of skin break down, no evidence of skin tears noted. IV catheter discontinued intact. Site without signs and symptoms of complications - no redness or edema noted at insertion site, patient denies c/o pain - only slight tenderness at site.  Dressing with slight pressure applied.  D/c Instructions-Education: Discharge instructions given to patient/family with verbalized understanding. D/c education completed with patient/family including follow up instructions, medication list, d/c activities limitations if indicated, with other d/c instructions as indicated by MD - patient able to verbalize understanding, all questions fully answered. Patient instructed to return to ED, call 911, or call MD for any changes in condition.  Patient escorted via [place], and D/C home via private auto.  Kathie Rhodes, RN 05/11/2022 11:01 AM

## 2022-05-11 NOTE — Progress Notes (Signed)
Occupational Therapy Treatment Patient Details Name: Troy [name] MRN: [id number] DOB: March 17, 1941 Today's Date: 05/11/2022   History of present illness Troy [name] is a 82 year old male with extensive history of HLD, CABG, CAD, DM 2, HTN, A-fib (on Coumadin), chronic anemia, epididymoorchitis .[name]... Presented with chief complaint of right testicular pain.     Patient reports that he has had some urinary transient and epididymoorchitis in August 2023 was seen and treated by urologist Dr. [name].     Apparently he has visited Dr. [name] in past week for urinary retention Foley catheter was placed on January 25th, currently was follow-up for catheter obstruction which cleared by flushing.  He was seen in office again 5 days ago for catheter removal, and post void residual on May 01, 2022.  Urine culture from January 25 has been negative.... Patient reports that he has been able to drain his bladder, has some incontinency.  He is bearing briefs..  Noted he was passing blood clots last week which has resolved.     Today he is reporting of significant right testicular pain started last night.  Tylenol did not help.  This morning was quite worse was associate with nausea vomiting but no fever   OT comments  Pt agreeable to co treat OT/PT session and participated well.  Pt received sitting on toilet, per nurse pt ambulated to bathroom independently with use of RW. He completed all toilet hygiene and clothing management independently. Pt was able to complete functional mobility from bathroom to sink where he washed hands and face with supervision. Pt completed bed mobility with modified independence with use of bed rails, he did not require physical assist. After ADLS were completed, pt ambulated in hallway with RW and supervision. Once back in room, pt t/f to chair independently with RW. Discussed changing d/c recommendations to home health due to pt's improvement and progress with goals. Pt verbalized  understanding and in agreeance. Will continue to follow pt in acute settign as schedule allow.    Recommendations for follow up therapy are one component of a multi-disciplinary discharge planning process, led by the attending physician.  Recommendations may be updated based on patient status, additional functional criteria and insurance authorization.    Follow Up Recommendations  Home health OT     Assistance Recommended at Discharge PRN  Patient can return home with the following  A little help with walking and/or transfers;A little help with bathing/dressing/bathroom   Equipment Recommendations  None recommended by OT    Recommendations for Other Services      Precautions / Restrictions Precautions Precautions: Fall Restrictions Weight Bearing Restrictions: No       Mobility Bed Mobility Overal bed mobility: Modified Independent Bed Mobility: Supine to Sit Rolling:  (use of bed rails to sit up, did not require physical assist)         General bed mobility comments: pt able to complete bed mobility with modified independence with use of bed rails    Transfers Overall transfer level: Modified independent Equipment used: Rolling walker (2 wheels) Transfers: Sit to/from Stand, Bed to chair/wheelchair/BSC Sit to Stand: Supervision     Step pivot transfers: Supervision     General transfer comment: Labored movement but no physical assist needed. Ambulatory transfer to chair from EOB.     Balance Overall balance assessment: Needs assistance (reliant on RW) Sitting-balance support: Feet supported, No upper extremity supported Sitting balance-Leahy Scale: Good Sitting balance - Comments: good seated at EOB   Standing  balance support: During functional activity, Reliant on assistive device for balance, Bilateral upper extremity supported   Standing balance comment: fair using RW                           ADL either performed or assessed with clinical  judgement   ADL Overall ADL's : Needs assistance/impaired (supervision)     Grooming: Wash/dry hands;Wash/dry face;Brushing hair;Standing;Supervision/safety Grooming Details (indicate cue type and reason): Grooming completed with pt standing at the sink. Pt washed/ dried face and hands, combed hair using RW for support and supervision for safety                                                       Cognition Arousal/Alertness: Awake/alert Behavior During Therapy: WFL for tasks assessed/performed Overall Cognitive Status: Within Functional Limits for tasks assessed                                                             Pertinent Vitals/ Pain       Pain Assessment Pain Assessment: Faces Faces Pain Scale: Hurts little more Pain Location: testicles Pain Descriptors / Indicators: Grimacing, Guarding Pain Intervention(s): Limited activity within patient's tolerance, Monitored during session                                                          Frequency  Min 2X/week        Progress Toward Goals  OT Goals(current goals can now be found in the care plan section)  Progress towards OT goals: Progressing toward goals  ADL Goals Pt Will Perform Grooming:  (completed standing at sink with use of RW this session) Pt Will Transfer to Toilet:  (Completed with use oif RW, otherwise indpendently) Pt Will Perform Toileting - Clothing Manipulation and hygiene:  (completed independent this session using lateral leans)  Plan Discharge plan needs to be updated                     AM-PAC OT "6 Clicks" Daily Activity     Outcome Measure   Help from another person eating meals?: None Help from another person taking care of personal grooming?: None Help from another person toileting, which includes using toliet, bedpan, or urinal?: A Little Help from another person bathing (including washing, rinsing,  drying)?: A Little Help from another person to put on and taking off regular upper body clothing?: None Help from another person to put on and taking off regular lower body clothing?: A Little 6 Click Score: 21    End of Session Equipment Utilized During Treatment: Rolling walker (2 wheels)  OT Visit Diagnosis: Unsteadiness on feet (R26.81);Other abnormalities of gait and mobility (R26.89);Muscle weakness (generalized) (M62.81);Pain Pain - part of body:  (testicles)   Activity Tolerance Patient tolerated treatment well   Patient Left in chair;with call bell/phone within reach   Nurse Communication  [id number] OT Time Calculation (min): 32 min  Charges: OT General Charges $OT Visit: 1 Visit OT Treatments $Self Care/Home Management : 23-37 mins   Frederic Jericho, OTR/L  05/11/2022, 9:49 AM

## 2022-05-11 NOTE — Progress Notes (Signed)
Physical Therapy Treatment Patient Details Name: Troy [name] MRN: [id number] DOB: 1940-10-14 Today's Date: 05/11/2022   History of Present Illness Troy [name] is a 82 year old male with extensive history of HLD, CABG, CAD, DM 2, HTN, A-fib (on Coumadin), chronic anemia, epididymoorchitis .[name]... Presented with chief complaint of right testicular pain.     Patient reports that he has had some urinary transient and epididymoorchitis in August 2023 was seen and treated by urologist Dr. [name].     Apparently he has visited Dr. [name] in past week for urinary retention Foley catheter was placed on January 25th, currently was follow-up for catheter obstruction which cleared by flushing.  He was seen in office again 5 days ago for catheter removal, and post void residual on May 01, 2022.  Urine culture from January 25 has been negative.... Patient reports that he has been able to drain his bladder, has some incontinency.  He is bearing briefs..  Noted he was passing blood clots last week which has resolved.     Today he is reporting of significant right testicular pain started last night.  Tylenol did not help.  This morning was quite worse was associate with nausea vomiting but no fever    PT Comments    Patient demonstrates good return for bed mobility, transferring to/from commode/chair and increased endurance/distance for ambulating in room and hallway without loss of balance.  Patient tolerated sitting up in chair after therapy.  Patient will benefit from continued skilled physical therapy in hospital and recommended venue below to increase strength, balance, endurance for safe ADLs and gait.     Recommendations for follow up therapy are one component of a multi-disciplinary discharge planning process, led by the attending physician.  Recommendations may be updated based on patient status, additional functional criteria and insurance authorization.  Follow Up Recommendations  Home health  PT Can patient physically be transported by private vehicle: Yes   Assistance Recommended at Discharge Set up Supervision/Assistance  Patient can return home with the following Help with stairs or ramp for entrance;Assistance with cooking/housework;A little help with walking and/or transfers   Equipment Recommendations  None recommended by PT    Recommendations for Other Services       Precautions / Restrictions Precautions Precautions: Fall Restrictions Weight Bearing Restrictions: No     Mobility  Bed Mobility Overal bed mobility: Modified Independent             General bed mobility comments: demonstrates good return for getting into/out of bed with HOB flat without requiring use of bed rails    Transfers Overall transfer level: Modified independent                 General transfer comment: good return for transferring to/from commode in bathroom and chair at bedside    Ambulation/Gait Ambulation/Gait assistance: Modified independent (Device/Increase time) Gait Distance (Feet): 120 Feet Assistive device: Rolling walker (2 wheels) Gait Pattern/deviations: Decreased step length - right, Decreased step length - left, Decreased stride length Gait velocity: decreased     General Gait Details: increased endurance/distance for gait training with slightly labored cadene no loss of balance or c/o increasing right testicle pain   Stairs             Wheelchair Mobility    Modified Rankin (Stroke Patients Only)       Balance Overall balance assessment: Needs assistance Sitting-balance support: Feet supported, No upper extremity supported Sitting balance-Leahy Scale: Good Sitting balance - Comments: good  seated at EOB   Standing balance support: During functional activity, Bilateral upper extremity supported Standing balance-Leahy Scale: Fair Standing balance comment: fair/good using RW                            Cognition  Arousal/Alertness: Awake/alert Behavior During Therapy: WFL for tasks assessed/performed Overall Cognitive Status: Within Functional Limits for tasks assessed                                          Exercises      General Comments        Pertinent Vitals/Pain Pain Assessment Pain Assessment: Faces Faces Pain Scale: Hurts little more Pain Location: right testicle Pain Descriptors / Indicators: Grimacing, Guarding Pain Intervention(s): Limited activity within patient's tolerance, Monitored during session, Repositioned    Home Living                          Prior Function            PT Goals (current goals can now be found in the care plan section) Acute Rehab PT Goals Patient Stated Goal: return home with family to assist PT Goal Formulation: With patient Time For Goal Achievement: 05/21/22 Potential to Achieve Goals: Good Progress towards PT goals: Progressing toward goals    Frequency    Min 3X/week      PT Plan Discharge plan needs to be updated    Co-evaluation PT/OT/SLP Co-Evaluation/Treatment: Yes Reason for Co-Treatment: To address functional/ADL transfers PT goals addressed during session: Mobility/safety with mobility;Balance;Proper use of DME        AM-PAC PT "6 Clicks" Mobility   Outcome Measure  Help needed turning from your back to your side while in a flat bed without using bedrails?: None Help needed moving from lying on your back to sitting on the side of a flat bed without using bedrails?: None Help needed moving to and from a bed to a chair (including a wheelchair)?: None Help needed standing up from a chair using your arms (e.g., wheelchair or bedside chair)?: None Help needed to walk in hospital room?: A Little Help needed climbing 3-5 steps with a railing? : A Little 6 Click Score: 22    End of Session   Activity Tolerance: Patient tolerated treatment well Patient left: in chair;with call bell/phone  within reach Nurse Communication: Mobility status PT Visit Diagnosis: Unsteadiness on feet (R26.81);Other abnormalities of gait and mobility (R26.89);Muscle weakness (generalized) (M62.81)     Time: [id number] PT Time Calculation (min) (ACUTE ONLY): 30 min  Charges:  $Gait Training: 8-22 mins $Therapeutic Activity: 8-22 mins                     11:43 AM, 05/11/22 Lonell Grandchild, MPT Physical Therapist with [facility] [phone] office [phone] mobile phone

## 2022-05-11 NOTE — Telephone Encounter (Signed)
Patient made aware of lab results in office.

## 2022-05-12 LAB — CULTURE, BLOOD (ROUTINE X 2)
Culture: NO GROWTH
Culture: NO GROWTH
Special Requests: ADEQUATE
Special Requests: ADEQUATE

## 2022-05-15 ENCOUNTER — Ambulatory Visit: Payer: Medicare HMO

## 2022-05-16 ENCOUNTER — Ambulatory Visit (INDEPENDENT_AMBULATORY_CARE_PROVIDER_SITE_OTHER): Payer: No Typology Code available for payment source | Admitting: Urology

## 2022-05-16 ENCOUNTER — Encounter: Payer: Self-pay | Admitting: Urology

## 2022-05-16 ENCOUNTER — Ambulatory Visit: Payer: No Typology Code available for payment source | Admitting: Urology

## 2022-05-16 VITALS — BP 114/64 | HR 56

## 2022-05-16 DIAGNOSIS — R339 Retention of urine, unspecified: Secondary | ICD-10-CM

## 2022-05-16 DIAGNOSIS — N453 Epididymo-orchitis: Secondary | ICD-10-CM | POA: Diagnosis not present

## 2022-05-16 DIAGNOSIS — N50819 Testicular pain, unspecified: Secondary | ICD-10-CM | POA: Diagnosis not present

## 2022-05-16 LAB — BLADDER SCAN AMB NON-IMAGING: Scan Result: 54

## 2022-05-16 NOTE — Patient Instructions (Signed)
Orchitis  Orchitis is inflammation of a testicle. Testicles are the male organs that produce sperm. The testicles are held in a fleshy sac (scrotum) located behind the penis. Orchitis usually affects only one testicle, but it can affect both. Orchitis is caused by infection. Many kinds of bacteria and viruses can cause this infection. The condition can develop suddenly. What are the causes? This condition may be caused by: Infection from viruses or bacteria. Other organisms, such as fungi or parasites. This is rare but can happen in men who have a weak body defense system (immune system), such as men who have HIV. Bacteria  Bacterial orchitis often occurs along with an infection of the tube that collects and stores sperm (epididymis). In men who are not sexually active, this infection usually starts as a urinary tract infection and spreads to the testicle. In sexually active men, sexually transmitted infections (STIs) are the most common cause of bacterial orchitis. These can include: Gonorrhea. Chlamydia. Viruses Mumps is the most common cause of viral orchitis, though mumps is now rare in many areas because of vaccination. Other viruses that can cause orchitis include: The chickenpox virus (varicella-zoster virus). The virus that causes mononucleosis (Epstein-Barr virus). What increases the risk? The following factors may make you more likely to develop this condition: For viral orchitis: Not having been vaccinated against mumps. For bacterial orchitis: Having had frequent urinary tract infections. Engaging in high-risk sexual behaviors, such as having multiple sexual partners or having sex without using a condom. Having a sexual partner with an STI. Having had urinary tract surgery. Using a tube that is passed through the penis to drain urine (Foley catheter). Having an enlarged prostate gland. What are the signs or symptoms? The most common symptoms of orchitis are swelling and  pain in the scrotum. Other signs and symptoms may include: Feeling generally sick (malaise). Fever and chills. Painful urination. Painful ejaculation. Headache. Fatigue. Nausea. Blood or discharge from the penis. Swollen lymph nodes in the groin area (inguinal nodes). How is this diagnosed? This condition may be diagnosed based on: Your symptoms. Your health care provider may suspect orchitis if you have a painful, swollen testicle along with other signs and symptoms of the condition. A physical exam. You may also have other tests, including: A blood test to check for signs of infection. A urine test to check for a urinary tract infection or STI. Using a swab to collect a fluid sample from the tip of the penis to test for STIs. Taking an image of the testicle using sound waves and a computer (testicular ultrasound). How is this treated? Treatment for this condition depends on the cause.  For bacterial orchitis, your health care provider may prescribe antibiotic medicines. Bacterial infections usually clear up within a few days. For both viral infections and bacterial infections, treatment may include: Rest. Anti-inflammatory medicines. Pain medicines. Raising (elevating) the scrotum with a towel or pillow and applying ice. Follow these instructions at home: Managing pain and swelling Elevate your scrotum and apply ice as directed. To do this: Put ice in a plastic bag. Place a small towel or pillow between your legs. Rest your scrotum on the pillow or towel. Place another towel between your skin and the plastic bag. Leave the ice on for 20 minutes, 2-3 times a day. Remove the ice if your skin turns bright red. This is very important. If you cannot feel pain, heat, or cold, you have a greater risk of damage to the area. General instructions  Rest as told by your health care provider. Take over-the-counter and prescription medicines only as told by your health care provider. If you  were prescribed an antibiotic medicine, take it as told by your health care provider. Do not stop taking the antibiotic even if you start to feel better. Do not have sex until your health care provider says it is okay to do so. Keep all follow-up visits. This is important. Contact a health care provider if: You have a fever. Pain and swelling have not gotten better after 3 days. Get help right away if: Your pain is getting worse. The swelling in your testicle gets worse. Summary Orchitis is inflammation of a testicle. It is caused by an infection from bacteria or a virus. The most common symptoms of orchitis are swelling and pain in the scrotum. Treatment for this condition depends on the cause. It may include medicines to fight the infection, reduce inflammation, and relieve the pain. Follow your health care provider's instructions about resting, icing, not having sex, and taking medicines. This information is not intended to replace advice given to you by your health care provider. Make sure you discuss any questions you have with your health care provider. Document Revised: 09/27/2020 Document Reviewed: 09/27/2020 Elsevier Patient Education  [place].

## 2022-05-16 NOTE — Progress Notes (Signed)
post void residual=54

## 2022-05-16 NOTE — Progress Notes (Signed)
05/16/2022 2:32 PM   Troy [name] 1940/10/15 [id number]  Referring provider: Sharilyn [name], [place],  [place] [address]  Followup epididymitis   HPI: Troy [name] is a 82yo here for followup for epididymo-orchitis. He was admitted for recurrent epididymitis last week. He grew a pseudomonas UTI and finished cefepime today.    PMH: Past Medical History:  Diagnosis Date   AAA (abdominal aortic aneurysm) ([place])    Followed by Troy [name] Early   Arthritis    CAD (coronary artery disease)    a. CABG 2000.   Cancer [facility])    Prostate:  Radiation Tx   Carotid artery disease ([place])    Chest pain    precordial. mild chronic .[name]... nonischemic   CHF (congestive heart failure) (HCC)    Chronic edema    Coronary artery disease    a. Nuclear, January, 2008, no ischemia b. Cath 08/2012- 1/4 patent grafts, RCA CTO, no flow-limiting disease, medically managed   Diabetes mellitus without complication ([place])    Dizziness 02/2011   Dyslipidemia    Fall    GERD (gastroesophageal reflux disease)    TAKES TUMS & ROLAIDS AS NEEDED   Heart murmur    Hx of CABG 2000   Hypertension    Myocardial infarction [facility]) 1999   Neck pain 02/2011   Pneumonia    Pulmonary hypertension ([place])    Renal artery stenosis (HCC)    50-70%   S/P femoropopliteal bypass surgery    Troy [name]   Sinus bradycardia    Asymptomatic    Surgical History: Past Surgical History:  Procedure Laterality Date   CARDIAC CATHETERIZATION  09/26/2012   1/4 patent bypass (occluded SVG-PDA, SVG-OM, LIMA-LAD), SVG-diagonal patent and fills the diagonal and LAD, distal RCA occlusion with left to right collateralization, patent circumflex, LAD with no flow-limiting disease and antegrade flow competitively from SVG-diagonal; EF 60-65%   COLONOSCOPY  11/30/2009   [id number]. next TCS 11/2019   COLONOSCOPY WITH PROPOFOL N/A 12/18/2019   Procedure: COLONOSCOPY WITH PROPOFOL;  Surgeon: Troy Quale,  Troy [name];  [facility];  Service: Gastroenterology;  Laterality: N/A;  1030   CORONARY ARTERY BYPASS GRAFT  2000   CYSTOSCOPY N/A 08/31/2021   Procedure: CYSTOSCOPY;  Surgeon: Troy Gustin, Troy [name];  Location: [facility] ORS;  Service: Urology;  Laterality: N/A;  pt knows to arrive at 7:00   [place] N/A 03/22/2022   Procedure: CYSTOSCOPY WITH FULGERATION;  Surgeon: Troy Gustin, Troy [name];  Location: [facility] ORS;  Service: Urology;  Laterality: N/A;   ESOPHAGOGASTRODUODENOSCOPY N/A 08/12/2013   [id number] peptic stricture with erosive refluxesophagitis - status post Maloney dilation. Hiatal hernia. Abnormalgastric mucosa. Deformity of the pyloric channel suggestive ofprior peptic ulcer disease. Duodenal bulbar diverticulum Statuspost gastric biopsy. h.pylori   LEFT HEART CATHETERIZATION WITH CORONARY/GRAFT ANGIOGRAM N/A 09/26/2012   Procedure: LEFT HEART CATHETERIZATION WITH Troy [name];  Surgeon: Troy Blanks, Troy [name];  Location: [facility] CATH LAB;  Service: Cardiovascular;  Laterality: N/A;   MALONEY DILATION N/A 08/12/2013   Procedure: Venia Minks DILATION;  Surgeon: Troy Dolin, Troy [name];  [facility];  Service: Endoscopy;  Laterality: N/A;   POLYPECTOMY  12/18/2019   Procedure: POLYPECTOMY;  Surgeon: Troy Quale, Troy [name];  [facility]: [facility];  Service: Gastroenterology;;  ascending colon polyp    PR VEIN BYPASS GRAFT,AORTO-FEM-POP Right 07/19/1999   PR VEIN BYPASS GRAFT,AORTO-FEM-POP Left 05/02/2006   RIGHT HEART CATH AND CORONARY/GRAFT ANGIOGRAPHY N/A 04/11/2020   Procedure: RIGHT  HEART CATH AND CORONARY/GRAFT ANGIOGRAPHY;  Surgeon: Troy Dresser, Troy [name];  Location: [place] CV LAB;  Service: Cardiovascular;  Laterality: N/A;   SAVORY DILATION N/A 08/12/2013   Procedure: SAVORY DILATION;  Surgeon: Troy Dolin, Troy [name];  [facility];  Service: Endoscopy;  Laterality: N/A;   TRANSURETHRAL RESECTION OF BLADDER TUMOR N/A 08/31/2021    Procedure: TRANSURETHRAL RESECTION OF BLADDER TUMOR (TURBT);  Surgeon: Troy Gustin, Troy [name];  Location: [facility];  Service: Urology;  Laterality: N/A;   WRIST SURGERY     cyst removal    Home Medications:  Allergies as of 05/16/2022       Reactions   Niacin Itching, Rash   Burning sensation   Contrast Media [iodinated Contrast Media] Nausea And Vomiting   Iodine-131 Nausea And Vomiting   Nsaids Other (See Comments)   Taking Coumadin        Medication List        Accurate as of May 16, 2022  2:32 PM. If you have any questions, ask your nurse or doctor.          acetaminophen 325 MG tablet Commonly known as: TYLENOL Take 2 tablets (650 mg total) by mouth every 6 (six) hours as needed for mild pain, fever or headache (or Fever >/= 101). What changed: how much to take   acidophilus Caps capsule Take 2 capsules by mouth 3 (three) times daily.   albuterol 108 (90 Base) MCG/ACT inhaler Commonly known as: VENTOLIN HFA Inhale 2 puffs into the lungs every 6 (six) hours as needed for wheezing or shortness of breath.   aspirin EC 81 MG tablet Take 1 tablet (81 mg total) by mouth daily with breakfast.   atorvastatin 40 MG tablet Commonly known as: LIPITOR Take 1 tablet (40 mg total) by mouth daily.   dutasteride 0.5 MG capsule Commonly known as: AVODART Take 1 capsule (0.5 mg total) by mouth daily.   HYDROcodone-acetaminophen 10-325 MG tablet Commonly known as: NORCO Take 1 tablet by mouth 2 (two) times daily as needed for moderate pain.   ipratropium 0.03 % nasal spray Commonly known as: ATROVENT Place 2 sprays into both nostrils as needed for rhinitis.   isosorbide mononitrate 60 MG 24 hr tablet Commonly known as: IMDUR Take 2 tablets (120 mg total) by mouth every evening.   latanoprost 0.005 % ophthalmic solution Commonly known as: XALATAN Place 1 drop into both eyes at bedtime.   losartan 25 MG tablet Commonly known as: COZAAR Take 1 tablet (25 mg  total) by mouth daily.   metFORMIN 500 MG tablet Commonly known as: GLUCOPHAGE Take 500 mg by mouth 2 (two) times daily.   metoprolol succinate 25 MG 24 hr tablet Commonly known as: TOPROL-XL Take 1 tablet (25 mg total) by mouth daily. Pt. Needs to make an appt. With Cardiologist in order to receive further refills. Thank You. 1st Attempt. What changed:  how much to take additional instructions   nitroGLYCERIN 0.4 MG SL tablet Commonly known as: NITROSTAT Place 1 tablet (0.4 mg total) under the tongue every 5 (five) minutes x 3 doses as needed for chest pain.   OXYGEN Inhale 3 L into the lungs at bedtime.   pantoprazole 40 MG tablet Commonly known as: PROTONIX Take 1 tablet (40 mg total) by mouth daily. Take 30-60 min before first meal of the day   spironolactone 25 MG tablet Commonly known as: ALDACTONE Take 1 tablet (25 mg total) by mouth daily.   tamsulosin 0.4  MG Caps capsule Commonly known as: FLOMAX Take 1 capsule (0.4 mg total) by mouth daily after supper.   torsemide 10 MG tablet Commonly known as: DEMADEX Take 10-20 mg by mouth See admin instructions. Take 10 mg daily, may increase to 20 mg as needed for weight gain   Vitamin D (Ergocalciferol) 1.25 MG (50000 UNIT) Caps capsule Commonly known as: DRISDOL Take 1 capsule by mouth every Sunday.   Vitamin D3 50 MCG (2000 UT) Caps Take 2,000 Units by mouth daily.   warfarin 2 MG tablet Commonly known as: COUMADIN Take 2 mg by mouth daily.        Allergies:  Allergies  Allergen Reactions   Niacin Itching and Rash    Burning sensation   Contrast Media [Iodinated Contrast Media] Nausea And Vomiting   Iodine-131 Nausea And Vomiting   Nsaids Other (See Comments)    Taking Coumadin    Family History: Family History  Problem Relation Age of Onset   Deep vein thrombosis Father    Lung cancer Sister    Diabetes Sister    Heart disease Sister        After age 24   Hyperlipidemia Sister    Hypertension  Sister    Lung cancer Sister    Breast cancer Sister    Hypertension Mother    Diabetes Sister    Coronary artery disease Other        family hx of   Cancer Brother        "crab cancer"   Heart disease Brother    Heart attack Brother    Heart attack Daughter    Colon cancer Neg Hx    Stroke Neg Hx     Social History:  reports that he quit smoking about 4 years ago. His smoking use included cigarettes. He has a 140.00 pack-year smoking history. He has never used smokeless tobacco. He reports current alcohol use. He reports that he does not use drugs.  ROS: All other review of systems were reviewed and are negative except what is noted above in HPI  Physical Exam: BP 114/64   Pulse (!) 56   Constitutional:  Alert and oriented, No acute distress. HEENT: [place] AT, moist mucus membranes.  Trachea midline, no masses. Cardiovascular: No clubbing, cyanosis, or edema. Respiratory: Normal respiratory effort, no increased work of breathing. GI: Abdomen is soft, nontender, nondistended, no abdominal masses GU: No CVA tenderness.  Lymph: No cervical or inguinal lymphadenopathy. Skin: No rashes, bruises or suspicious lesions. Neurologic: Grossly intact, no focal deficits, moving all 4 extremities. Psychiatric: Normal mood and affect.  Laboratory Data: Lab Results  Component Value Date   WBC 9.4 05/11/2022   HGB 9.1 (L) 05/11/2022   HCT 29.8 (L) 05/11/2022   MCV 88.2 05/11/2022   PLT 200 05/11/2022    Lab Results  Component Value Date   CREATININE 1.00 05/11/2022    No results found for: "PSA"  No results found for: "TESTOSTERONE"  Lab Results  Component Value Date   HGBA1C 6.9 (H) 05/06/2022    Urinalysis    Component Value Date/Time   COLORURINE YELLOW 05/06/2022 1153   APPEARANCEUR CLEAR 05/06/2022 1153   APPEARANCEUR Cloudy (A) 04/26/2022 1634   LABSPEC 1.013 05/06/2022 1153   PHURINE 9.0 (H) 05/06/2022 1153   GLUCOSEU NEGATIVE 05/06/2022 1153   HGBUR SMALL (A)  05/06/2022 1153   BILIRUBINUR NEGATIVE 05/06/2022 1153   BILIRUBINUR Negative 04/26/2022 [place] 05/06/2022 1153   PROTEINUR  30 (A) 05/06/2022 1153   NITRITE NEGATIVE 05/06/2022 1153   LEUKOCYTESUR MODERATE (A) 05/06/2022 1153    Lab Results  Component Value Date   LABMICR See below: 04/26/2022   WBCUA 6-10 (A) 04/26/2022   LABEPIT 0-10 04/26/2022   MUCUS Present 10/27/2021   BACTERIA RARE (A) 05/06/2022    Pertinent Imaging:  No results found for this or any previous visit.  No results found for this or any previous visit.  No results found for this or any previous visit.  No results found for this or any previous visit.  No results found for this or any previous visit.  No valid procedures specified. Results for orders placed during the hospital encounter of 08/18/21  CT HEMATURIA WORKUP  Narrative CLINICAL DATA:  Gross hematuria since early May, history of prostate cancer * Tracking Code: BO *  EXAM: CT ABDOMEN AND PELVIS WITHOUT AND WITH CONTRAST  TECHNIQUE: Multidetector CT imaging of the abdomen and pelvis was performed following the standard protocol before and following the bolus administration of intravenous contrast.  RADIATION DOSE REDUCTION: This exam was performed according to the departmental dose-optimization program which includes automated exposure control, adjustment of the mA and/or kV according to patient size and/or use of iterative reconstruction technique.  CONTRAST:  184m OMNIPAQUE IOHEXOL 300 MG/ML  SOLN  COMPARISON:  None Available.  FINDINGS: Lower chest: No acute abnormality. Cardiomegaly. Coronary artery calcifications.  Hepatobiliary: No solid liver abnormality is seen. Small, definitively benign cyst or hemangioma of the central liver dome for which no further follow-up or characterization is required (series 15, image 30). No gallstones, gallbladder wall thickening, or biliary dilatation.  Pancreas:  Unremarkable. No pancreatic ductal dilatation or surrounding inflammatory changes.  Spleen: Normal in size without significant abnormality.  Adrenals/Urinary Tract: Adrenal glands are unremarkable. Small simple, benign bilateral renal cortical cysts, as well as intrinsically hyperdense, nonenhancing hemorrhagic or proteinaceous cysts of the midportions of the left and right kidneys (series 2, image 37). No urinary tract filling defect on delayed phase imaging. Bladder is unremarkable.  Stomach/Bowel: Stomach is within normal limits. Appendix appears normal. No evidence of bowel wall thickening, distention, or inflammatory changes. Descending and sigmoid diverticulosis.  Vascular/Lymphatic: Aortic atherosclerosis. 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 No enlarged abdominal or pelvic lymph nodes.  Reproductive: Prostate brachytherapy.  Other: Small, fat containing bilateral inguinal hernias. No ascites.  Musculoskeletal: No acute or significant osseous findings.  IMPRESSION: 1. No evidence of urinary tract mass, calculus, or hydronephrosis. No urinary tract filling defect on delayed phase imaging. 2. Prostate brachytherapy. No evidence of mass or lymphadenopathy in the abdomen or pelvis. 3. Small simple, benign bilateral renal cortical cysts, as well as nonenhancing, benign hemorrhagic or proteinaceous cysts. No further follow-up or characterization is required for these benign renal cysts. 4. 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 Recommend referral to a vascular specialist. This recommendation follows ACR consensus guidelines: White Paper of the ACR Incidental Findings Committee II on Vascular Findings.  J Am Coll Radiol 2013; 10:789-794. 5. Cardiomegaly and coronary artery disease.  Aortic Atherosclerosis (ICD10-I70.0).   Electronically Signed By: Delanna Ahmadi M.D. On: 08/19/2021 11:29  No results found for this or any previous visit.   Assessment & Plan:    1. Urinary retention resolved - BLADDER SCAN AMB NON-IMAGING  2. Orchitis and epididymitis Urine for culture  3. Pain in testicle, unspecified laterality resolved   No follow-ups on file.  Nicolette Bang, Troy [name]  [facility] [place]

## 2022-05-17 DIAGNOSIS — E6609 Other obesity due to excess calories: Secondary | ICD-10-CM | POA: Diagnosis not present

## 2022-05-17 DIAGNOSIS — Z6831 Body mass index (BMI) 31.0-31.9, adult: Secondary | ICD-10-CM | POA: Diagnosis not present

## 2022-05-17 DIAGNOSIS — J988 Other specified respiratory disorders: Secondary | ICD-10-CM | POA: Diagnosis not present

## 2022-05-17 DIAGNOSIS — N452 Orchitis: Secondary | ICD-10-CM | POA: Diagnosis not present

## 2022-05-17 DIAGNOSIS — R310 Gross hematuria: Secondary | ICD-10-CM | POA: Diagnosis not present

## 2022-05-17 DIAGNOSIS — N451 Epididymitis: Secondary | ICD-10-CM | POA: Diagnosis not present

## 2022-05-17 DIAGNOSIS — E860 Dehydration: Secondary | ICD-10-CM | POA: Diagnosis not present

## 2022-05-17 LAB — URINALYSIS, ROUTINE W REFLEX MICROSCOPIC
Bilirubin, UA: NEGATIVE
Glucose, UA: NEGATIVE
Ketones, UA: NEGATIVE
Leukocytes,UA: NEGATIVE
Nitrite, UA: NEGATIVE
Protein,UA: NEGATIVE
Specific Gravity, UA: 1.01 (ref 1.005–1.030)
Urobilinogen, Ur: 0.2 mg/dL (ref 0.2–1.0)
pH, UA: 7 (ref 5.0–7.5)

## 2022-05-17 LAB — MICROSCOPIC EXAMINATION
Bacteria, UA: NONE SEEN
Epithelial Cells (non renal): NONE SEEN /hpf (ref 0–10)

## 2022-05-19 LAB — URINE CULTURE

## 2022-05-22 ENCOUNTER — Ambulatory Visit: Payer: Medicare HMO

## 2022-06-01 ENCOUNTER — Ambulatory Visit (INDEPENDENT_AMBULATORY_CARE_PROVIDER_SITE_OTHER): Payer: No Typology Code available for payment source | Admitting: Urology

## 2022-06-01 ENCOUNTER — Encounter: Payer: Self-pay | Admitting: Urology

## 2022-06-01 VITALS — BP 176/76 | HR 58

## 2022-06-01 DIAGNOSIS — R310 Gross hematuria: Secondary | ICD-10-CM

## 2022-06-01 DIAGNOSIS — R3911 Hesitancy of micturition: Secondary | ICD-10-CM | POA: Diagnosis not present

## 2022-06-01 DIAGNOSIS — N453 Epididymo-orchitis: Secondary | ICD-10-CM

## 2022-06-01 DIAGNOSIS — Z79899 Other long term (current) drug therapy: Secondary | ICD-10-CM | POA: Diagnosis not present

## 2022-06-01 DIAGNOSIS — R32 Unspecified urinary incontinence: Secondary | ICD-10-CM

## 2022-06-01 LAB — BLADDER SCAN AMB NON-IMAGING: Scan Result: 31

## 2022-06-01 NOTE — Progress Notes (Signed)
06/01/2022 10:08 AM   Troy [name] [birth year] [id number]  Referring provider: Sharilyn Sites, MD [address] [place],  [place] [address]  Followup urinary incontinence, gross hematuria and epididymorchitis   HPI: Troy [name] is a 82yo here for followup for gross hematuria, epididymitis and urinary incontinence. No gross hematuria since last visit. Urine stream strong. He continues to have urinary uirgnecy, frequency and incontinence. He did 2 treatments of PTNS.    PMH: Past Medical History:  Diagnosis Date   AAA (abdominal aortic aneurysm) ([place])    Followed by Dr. Sherren Mocha Early   Arthritis    CAD (coronary artery disease)    a. CABG 2000.   Cancer [facility])    Prostate:  Radiation Tx   Carotid artery disease ([place])    Chest pain    precordial. mild chronic .[name]... nonischemic   CHF (congestive heart failure) (HCC)    Chronic edema    Coronary artery disease    a. Nuclear, January, 2008, no ischemia b. Cath 08/2012- 1/4 patent grafts, RCA CTO, no flow-limiting disease, medically managed   Diabetes mellitus without complication ([place])    Dizziness 02/2011   Dyslipidemia    Fall    GERD (gastroesophageal reflux disease)    TAKES TUMS & ROLAIDS AS NEEDED   Heart murmur    Hx of CABG 2000   Hypertension    Myocardial infarction [facility]) 1999   Neck pain 02/2011   Pneumonia    Pulmonary hypertension ([place])    Renal artery stenosis (HCC)    50-70%   S/P femoropopliteal bypass surgery    Dr. Donnetta Hutching   Sinus bradycardia    Asymptomatic    Surgical History: Past Surgical History:  Procedure Laterality Date   CARDIAC CATHETERIZATION  09/26/2012   1/4 patent bypass (occluded SVG-PDA, SVG-OM, LIMA-LAD), SVG-diagonal patent and fills the diagonal and LAD, distal RCA occlusion with left to right collateralization, patent circumflex, LAD with no flow-limiting disease and antegrade flow competitively from SVG-diagonal; EF 60-65%   COLONOSCOPY  11/30/2009   [id number]. next TCS  11/2019   COLONOSCOPY WITH PROPOFOL N/A 12/18/2019   Procedure: COLONOSCOPY WITH PROPOFOL;  Surgeon: Harvel Quale, MD;  [facility];  Service: Gastroenterology;  Laterality: N/A;  1030   CORONARY ARTERY BYPASS GRAFT  2000   CYSTOSCOPY N/A 08/31/2021   Procedure: CYSTOSCOPY;  Surgeon: Cleon Gustin, MD;  Location: [facility] ORS;  Service: Urology;  Laterality: N/A;  pt knows to arrive at 7:00   [place] N/A 03/22/2022   Procedure: CYSTOSCOPY WITH FULGERATION;  Surgeon: Cleon Gustin, MD;  Location: [facility] ORS;  Service: Urology;  Laterality: N/A;   ESOPHAGOGASTRODUODENOSCOPY N/A 08/12/2013   [id number] peptic stricture with erosive refluxesophagitis - status post Maloney dilation. Hiatal hernia. Abnormalgastric mucosa. Deformity of the pyloric channel suggestive ofprior peptic ulcer disease. Duodenal bulbar diverticulum Statuspost gastric biopsy. h.pylori   LEFT HEART CATHETERIZATION WITH CORONARY/GRAFT ANGIOGRAM N/A 09/26/2012   Procedure: LEFT HEART CATHETERIZATION WITH Beatrix Fetters;  Surgeon: Burnell Blanks, MD;  Location: [facility] CATH LAB;  Service: Cardiovascular;  Laterality: N/A;   MALONEY DILATION N/A 08/12/2013   Procedure: Venia Minks DILATION;  Surgeon: Daneil Dolin, MD;  [facility];  Service: Endoscopy;  Laterality: N/A;   POLYPECTOMY  12/18/2019   Procedure: POLYPECTOMY;  Surgeon: Harvel Quale, MD;  [facility];  Service: Gastroenterology;;  ascending colon polyp    PR VEIN BYPASS GRAFT,AORTO-FEM-POP Right 07/19/1999   PR VEIN BYPASS  GRAFT,AORTO-FEM-POP Left 05/02/2006   RIGHT HEART CATH AND CORONARY/GRAFT ANGIOGRAPHY N/A 04/11/2020   Procedure: RIGHT HEART CATH AND CORONARY/GRAFT ANGIOGRAPHY;  Surgeon: Larey Dresser, MD;  Location: [place] CV LAB;  Service: Cardiovascular;  Laterality: N/A;   SAVORY DILATION N/A 08/12/2013   Procedure: SAVORY DILATION;  Surgeon: Daneil Dolin, MD;   [facility];  Service: Endoscopy;  Laterality: N/A;   TRANSURETHRAL RESECTION OF BLADDER TUMOR N/A 08/31/2021   Procedure: TRANSURETHRAL RESECTION OF BLADDER TUMOR (TURBT);  Surgeon: Cleon Gustin, MD;  Location: [facility];  Service: Urology;  Laterality: N/A;   WRIST SURGERY     cyst removal    Home Medications:  Allergies as of 06/01/2022       Reactions   Niacin Itching, Rash   Burning sensation   Contrast Media [iodinated Contrast Media] Nausea And Vomiting   Iodine-131 Nausea And Vomiting   Nsaids Other (See Comments)   Taking Coumadin        Medication List        Accurate as of June 01, 2022 10:08 AM. If you have any questions, ask your nurse or doctor.          acetaminophen 325 MG tablet Commonly known as: TYLENOL Take 2 tablets (650 mg total) by mouth every 6 (six) hours as needed for mild pain, fever or headache (or Fever >/= 101). What changed: how much to take   acidophilus Caps capsule Take 2 capsules by mouth 3 (three) times daily.   albuterol 108 (90 Base) MCG/ACT inhaler Commonly known as: VENTOLIN HFA Inhale 2 puffs into the lungs every 6 (six) hours as needed for wheezing or shortness of breath.   aspirin EC 81 MG tablet Take 1 tablet (81 mg total) by mouth daily with breakfast.   atorvastatin 40 MG tablet Commonly known as: LIPITOR Take 1 tablet (40 mg total) by mouth daily.   dutasteride 0.5 MG capsule Commonly known as: AVODART Take 1 capsule (0.5 mg total) by mouth daily.   HYDROcodone-acetaminophen 10-325 MG tablet Commonly known as: NORCO Take 1 tablet by mouth 2 (two) times daily as needed for moderate pain.   ipratropium 0.03 % nasal spray Commonly known as: ATROVENT Place 2 sprays into both nostrils as needed for rhinitis.   isosorbide mononitrate 60 MG 24 hr tablet Commonly known as: IMDUR Take 2 tablets (120 mg total) by mouth every evening.   latanoprost 0.005 % ophthalmic solution Commonly known as:  XALATAN Place 1 drop into both eyes at bedtime.   losartan 25 MG tablet Commonly known as: COZAAR Take 1 tablet (25 mg total) by mouth daily.   metFORMIN 500 MG tablet Commonly known as: GLUCOPHAGE Take 500 mg by mouth 2 (two) times daily.   metoprolol succinate 25 MG 24 hr tablet Commonly known as: TOPROL-XL Take 1 tablet (25 mg total) by mouth daily. Pt. Needs to make an appt. With Cardiologist in order to receive further refills. Thank You. 1st Attempt. What changed:  how much to take additional instructions   nitroGLYCERIN 0.4 MG SL tablet Commonly known as: NITROSTAT Place 1 tablet (0.4 mg total) under the tongue every 5 (five) minutes x 3 doses as needed for chest pain.   OXYGEN Inhale 3 L into the lungs at bedtime.   pantoprazole 40 MG tablet Commonly known as: PROTONIX Take 1 tablet (40 mg total) by mouth daily. Take 30-60 min before first meal of the day   spironolactone 25 MG tablet Commonly  known as: ALDACTONE Take 1 tablet (25 mg total) by mouth daily.   tamsulosin 0.4 MG Caps capsule Commonly known as: FLOMAX Take 1 capsule (0.4 mg total) by mouth daily after supper.   torsemide 10 MG tablet Commonly known as: DEMADEX Take 10-20 mg by mouth See admin instructions. Take 10 mg daily, may increase to 20 mg as needed for weight gain   Vitamin D (Ergocalciferol) 1.25 MG (50000 UNIT) Caps capsule Commonly known as: DRISDOL Take 1 capsule by mouth every Sunday.   Vitamin D3 50 MCG (2000 UT) Caps Take 2,000 Units by mouth daily.   warfarin 2 MG tablet Commonly known as: COUMADIN Take 2 mg by mouth daily.        Allergies:  Allergies  Allergen Reactions   Niacin Itching and Rash    Burning sensation   Contrast Media [Iodinated Contrast Media] Nausea And Vomiting   Iodine-131 Nausea And Vomiting   Nsaids Other (See Comments)    Taking Coumadin    Family History: Family History  Problem Relation Age of Onset   Deep vein thrombosis Father     Lung cancer Sister    Diabetes Sister    Heart disease Sister        After age 68   Hyperlipidemia Sister    Hypertension Sister    Lung cancer Sister    Breast cancer Sister    Hypertension Mother    Diabetes Sister    Coronary artery disease Other        family hx of   Cancer Brother        "crab cancer"   Heart disease Brother    Heart attack Brother    Heart attack Daughter    Colon cancer Neg Hx    Stroke Neg Hx     Social History:  reports that he quit smoking about 4 years ago. His smoking use included cigarettes. He has a 140.00 pack-year smoking history. He has never used smokeless tobacco. He reports current alcohol use. He reports that he does not use drugs.  ROS: All other review of systems were reviewed and are negative except what is noted above in HPI  Physical Exam: BP (!) 176/76   Pulse (!) 58   Constitutional:  Alert and oriented, No acute distress. HEENT: [place] AT, moist mucus membranes.  Trachea midline, no masses. Cardiovascular: No clubbing, cyanosis, or edema. Respiratory: Normal respiratory effort, no increased work of breathing. GI: Abdomen is soft, nontender, nondistended, no abdominal masses GU: No CVA tenderness.  Lymph: No cervical or inguinal lymphadenopathy. Skin: No rashes, bruises or suspicious lesions. Neurologic: Grossly intact, no focal deficits, moving all 4 extremities. Psychiatric: Normal mood and affect.  Laboratory Data: Lab Results  Component Value Date   WBC 9.4 05/11/2022   HGB 9.1 (L) 05/11/2022   HCT 29.8 (L) 05/11/2022   MCV 88.2 05/11/2022   PLT 200 05/11/2022    Lab Results  Component Value Date   CREATININE 1.00 05/11/2022    No results found for: "PSA"  No results found for: "TESTOSTERONE"  Lab Results  Component Value Date   HGBA1C 6.9 (H) 05/06/2022    Urinalysis    Component Value Date/Time   COLORURINE YELLOW 05/06/2022 1153   APPEARANCEUR Clear 05/16/2022 1532   LABSPEC 1.013 05/06/2022 1153    PHURINE 9.0 (H) 05/06/2022 1153   GLUCOSEU Negative 05/16/2022 1532   HGBUR SMALL (A) 05/06/2022 1153   BILIRUBINUR Negative 05/16/2022 [place] 05/06/2022  1153   PROTEINUR Negative 05/16/2022 1532   PROTEINUR 30 (A) 05/06/2022 1153   NITRITE Negative 05/16/2022 1532   NITRITE NEGATIVE 05/06/2022 1153   LEUKOCYTESUR Negative 05/16/2022 1532   LEUKOCYTESUR MODERATE (A) 05/06/2022 1153    Lab Results  Component Value Date   LABMICR See below: 05/16/2022   WBCUA 0-5 05/16/2022   LABEPIT None seen 05/16/2022   MUCUS Present 10/27/2021   BACTERIA None seen 05/16/2022    Pertinent Imaging:  No results found for this or any previous visit.  No results found for this or any previous visit.  No results found for this or any previous visit.  No results found for this or any previous visit.  No results found for this or any previous visit.  No valid procedures specified. Results for orders placed during the hospital encounter of 08/18/21  CT HEMATURIA WORKUP  Narrative CLINICAL DATA:  Gross hematuria since early May, history of prostate cancer * Tracking Code: BO *  EXAM: CT ABDOMEN AND PELVIS WITHOUT AND WITH CONTRAST  TECHNIQUE: Multidetector CT imaging of the abdomen and pelvis was performed following the standard protocol before and following the bolus administration of intravenous contrast.  RADIATION DOSE REDUCTION: This exam was performed according to the departmental dose-optimization program which includes automated exposure control, adjustment of the mA and/or kV according to patient size and/or use of iterative reconstruction technique.  CONTRAST:  186m OMNIPAQUE IOHEXOL 300 MG/ML  SOLN  COMPARISON:  None Available.  FINDINGS: Lower chest: No acute abnormality. Cardiomegaly. Coronary artery calcifications.  Hepatobiliary: No solid liver abnormality is seen. Small, definitively benign cyst or hemangioma of the central liver dome  for which no further follow-up or characterization is required (series 15, image 30). No gallstones, gallbladder wall thickening, or biliary dilatation.  Pancreas: Unremarkable. No pancreatic ductal dilatation or surrounding inflammatory changes.  Spleen: Normal in size without significant abnormality.  Adrenals/Urinary Tract: Adrenal glands are unremarkable. Small simple, benign bilateral renal cortical cysts, as well as intrinsically hyperdense, nonenhancing hemorrhagic or proteinaceous cysts of the midportions of the left and right kidneys (series 2, image 37). No urinary tract filling defect on delayed phase imaging. Bladder is unremarkable.  Stomach/Bowel: Stomach is within normal limits. Appendix appears normal. No evidence of bowel wall thickening, distention, or inflammatory changes. Descending and sigmoid diverticulosis.  Vascular/Lymphatic: Aortic atherosclerosis. 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 No enlarged abdominal or pelvic lymph nodes.  Reproductive: Prostate brachytherapy.  Other: Small, fat containing bilateral inguinal hernias. No ascites.  Musculoskeletal: No acute or significant osseous findings.  IMPRESSION: 1. No evidence of urinary tract mass, calculus, or hydronephrosis. No urinary tract filling defect on delayed phase imaging. 2. Prostate brachytherapy. No evidence of mass or lymphadenopathy in the abdomen or pelvis. 3. Small simple, benign bilateral renal cortical cysts, as well as nonenhancing, benign hemorrhagic or proteinaceous cysts. No further follow-up or characterization is required for these benign renal cysts. 4. 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 Recommend referral  to a vascular specialist. This recommendation follows ACR consensus guidelines: White Paper of the ACR Incidental Findings Committee II on Vascular Findings. J Am Coll Radiol 2013; 10:789-794. 5. Cardiomegaly and coronary artery disease.  Aortic Atherosclerosis (ICD10-I70.0).   Electronically Signed By: Delanna Ahmadi M.D. On: 08/19/2021 11:29  No results found for this or any previous visit.   Assessment & Plan:    1. Orchitis and epididymitis -resolved - Urinalysis, Routine w reflex microscopic  2. Hesitancy of micturition -continue flomax 0.'4mg'$  daily - BLADDER SCAN AMB NON-IMAGING   No follow-ups on file.  Nicolette Bang, MD  [facility] [place]

## 2022-06-01 NOTE — Progress Notes (Signed)
post void residual=31 

## 2022-06-01 NOTE — Patient Instructions (Signed)

## 2022-06-04 ENCOUNTER — Ambulatory Visit (INDEPENDENT_AMBULATORY_CARE_PROVIDER_SITE_OTHER): Payer: No Typology Code available for payment source | Admitting: Urology

## 2022-06-04 ENCOUNTER — Ambulatory Visit: Payer: No Typology Code available for payment source

## 2022-06-04 DIAGNOSIS — R32 Unspecified urinary incontinence: Secondary | ICD-10-CM

## 2022-06-04 NOTE — Progress Notes (Cosign Needed Addendum)
PTNS  Session # 5 of 12  Health & Social Factors: diabetes Caffeine: 2 cups Alcohol: 2 oz a week Daytime voids #per day: 6 Night-time voids #per night: 7 Urgency: yes Incontinence Episodes #per day: all day Ankle used: right Treatment Setting: 2 Feeling/ Response: positive sensation Comments: n/a  Performed By: [place] LPN  Follow Up: keep scheduled nv

## 2022-06-07 DIAGNOSIS — E6609 Other obesity due to excess calories: Secondary | ICD-10-CM | POA: Diagnosis not present

## 2022-06-07 DIAGNOSIS — I2510 Atherosclerotic heart disease of native coronary artery without angina pectoris: Secondary | ICD-10-CM | POA: Diagnosis not present

## 2022-06-07 DIAGNOSIS — I10 Essential (primary) hypertension: Secondary | ICD-10-CM | POA: Diagnosis not present

## 2022-06-07 DIAGNOSIS — Z6831 Body mass index (BMI) 31.0-31.9, adult: Secondary | ICD-10-CM | POA: Diagnosis not present

## 2022-06-08 ENCOUNTER — Ambulatory Visit: Payer: Medicare HMO | Admitting: Urology

## 2022-06-14 ENCOUNTER — Ambulatory Visit (INDEPENDENT_AMBULATORY_CARE_PROVIDER_SITE_OTHER): Payer: No Typology Code available for payment source | Admitting: Urology

## 2022-06-14 DIAGNOSIS — R32 Unspecified urinary incontinence: Secondary | ICD-10-CM | POA: Diagnosis not present

## 2022-06-14 NOTE — Progress Notes (Signed)
PTNS  Session # 6 of 12  Health & Social Factors: yes  diabetes and CHF Caffeine: 2 cups a day Alcohol: 2 oz a week Daytime voids #per day: 7 Night-time voids #per night: 6 Urgency: yes Incontinence Episodes #per day: everytime Ankle used: right Treatment Setting: 2 Feeling/ Response: positive sensation Comments: N/A  Performed By: Marisue Brooklyn, CMA  Follow Up: Keep Scheduled NV

## 2022-06-19 ENCOUNTER — Ambulatory Visit (HOSPITAL_BASED_OUTPATIENT_CLINIC_OR_DEPARTMENT_OTHER)
Admission: RE | Admit: 2022-06-19 | Discharge: 2022-06-19 | Disposition: A | Discharge status: Home / Self Care | Payer: Medicare HMO | Source: Ambulatory Visit | Attending: Cardiology | Admitting: Cardiology

## 2022-06-19 ENCOUNTER — Ambulatory Visit (HOSPITAL_COMMUNITY)
Admission: RE | Admit: 2022-06-19 | Discharge: 2022-06-19 | Disposition: A | Discharge status: Home / Self Care | Payer: Medicare HMO | Source: Ambulatory Visit | Attending: Family Medicine | Admitting: Family Medicine

## 2022-06-19 ENCOUNTER — Encounter (HOSPITAL_COMMUNITY): Payer: Self-pay | Admitting: Cardiology

## 2022-06-19 VITALS — BP 120/70 | HR 57 | Wt 205.4 lb

## 2022-06-19 DIAGNOSIS — I110 Hypertensive heart disease with heart failure: Secondary | ICD-10-CM | POA: Diagnosis not present

## 2022-06-19 DIAGNOSIS — Z7901 Long term (current) use of anticoagulants: Secondary | ICD-10-CM | POA: Insufficient documentation

## 2022-06-19 DIAGNOSIS — E119 Type 2 diabetes mellitus without complications: Secondary | ICD-10-CM | POA: Insufficient documentation

## 2022-06-19 DIAGNOSIS — I4821 Permanent atrial fibrillation: Secondary | ICD-10-CM | POA: Insufficient documentation

## 2022-06-19 DIAGNOSIS — E785 Hyperlipidemia, unspecified: Secondary | ICD-10-CM | POA: Diagnosis not present

## 2022-06-19 DIAGNOSIS — I5032 Chronic diastolic (congestive) heart failure: Secondary | ICD-10-CM | POA: Insufficient documentation

## 2022-06-19 DIAGNOSIS — I25119 Atherosclerotic heart disease of native coronary artery with unspecified angina pectoris: Secondary | ICD-10-CM | POA: Insufficient documentation

## 2022-06-19 LAB — BASIC METABOLIC PANEL
Anion gap: 10 (ref 5–15)
BUN: 38 mg/dL — ABNORMAL HIGH (ref 8–23)
CO2: 26 mmol/L (ref 22–32)
Calcium: 9.4 mg/dL (ref 8.9–10.3)
Chloride: 102 mmol/L (ref 98–111)
Creatinine, Ser: 1.47 mg/dL — ABNORMAL HIGH (ref 0.61–1.24)
GFR, Estimated: 48 mL/min — ABNORMAL LOW (ref >60)
Glucose, Bld: 122 mg/dL — ABNORMAL HIGH (ref 70–99)
Potassium: 3.9 mmol/L (ref 3.5–5.1)
Sodium: 138 mmol/L (ref 135–145)

## 2022-06-19 LAB — LIPID PANEL
Cholesterol: 123 mg/dL (ref 0–200)
HDL: 26 mg/dL — ABNORMAL LOW (ref >40)
LDL Cholesterol: 64 mg/dL (ref 0–99)
Total CHOL/HDL Ratio: 4.7 RATIO
Triglycerides: 163 mg/dL — ABNORMAL HIGH (ref <150)
VLDL: 33 mg/dL (ref 0–40)

## 2022-06-19 LAB — CBC
HCT: 37.7 % — ABNORMAL LOW (ref 39.0–52.0)
Hemoglobin: 12.1 g/dL — ABNORMAL LOW (ref 13.0–17.0)
MCH: 28.5 pg (ref 26.0–34.0)
MCHC: 32.1 g/dL (ref 30.0–36.0)
MCV: 88.7 fL (ref 80.0–100.0)
Platelets: 213 10*3/uL (ref 150–400)
RBC: 4.25 MIL/uL (ref 4.22–5.81)
RDW: 18.1 % — ABNORMAL HIGH (ref 11.5–15.5)
WBC: 8.6 10*3/uL (ref 4.0–10.5)
nRBC: 0 % (ref 0.0–0.2)

## 2022-06-19 LAB — BRAIN NATRIURETIC PEPTIDE: B Natriuretic Peptide: 151.7 pg/mL — ABNORMAL HIGH (ref 0.0–100.0)

## 2022-06-19 LAB — ECHOCARDIOGRAM COMPLETE
Area-P 1/2: 3.53 cm2
S' Lateral: 3.2 cm

## 2022-06-19 MED ORDER — ISOSORBIDE MONONITRATE ER 60 MG PO TB24: 120.0000 mg | ORAL_TABLET | Freq: Every evening | ORAL | 3 refills | Status: DC

## 2022-06-19 NOTE — Patient Instructions (Signed)
There has been no changes to your medications.  Labs done today, your results will be available in MyChart, we will contact you for abnormal readings.  Your physician recommends that you schedule a follow-up appointment in: 4 months ( July) ** please call the office in May to arrange your follow up appointment. **  If you have any questions or concerns before your next appointment please send us a message through mychart or call our office at [phone].    TO LEAVE A MESSAGE FOR THE NURSE SELECT OPTION 2, PLEASE LEAVE A MESSAGE INCLUDING: YOUR NAME DATE OF BIRTH CALL BACK NUMBER REASON FOR CALL**this is important as we prioritize the call backs  YOU WILL RECEIVE A CALL BACK THE SAME DAY AS LONG AS YOU CALL BEFORE 4:00 PM  At the [facility], you and your health needs are our priority. As part of our continuing mission to provide you with exceptional heart care, we have created designated Provider Care Teams. These Care Teams include your primary Cardiologist (physician) and Advanced Practice Providers (APPs- Physician Assistants and Nurse Practitioners) who all work together to provide you with the care you need, when you need it.   You may see any of the following providers on your designated Care Team at your next follow up: Dr Daniel Bensimhon Dr Dalton McLean Dr. Aditya Sabharwal Amy Clegg, NP Brittainy Simmons, PA Jessica Milford,NP Lindsay Finch, PA Alma Diaz, NP Lauren Kemp, PharmD   Please be sure to bring in all your medications bottles to every appointment.    Thank you for choosing [facility]    

## 2022-06-20 NOTE — Progress Notes (Signed)
PCP: Sharilyn Sites, MD HF Cardiology: Dr. Aundra Dubin  82 y.o. with history of permanent atrial fibrillation, CAD s/p CABG 2000, PAD, chronic diastolic CHF was referred to CHF clinic by Melina Copa, PA, for evaluation of CHF and pulmonary hypertension.  Patient had CABG in 2000, last cath was in 2014 showing occluded SVG-OM, occluded SVG-PDA, occluded RCA, and atretic LIMA.  SVG-D was patent and native LAD was patent.  Patient had bilateral fem-pop bypasses, peripheral dopplers in 7/21 showed that the right fem-pop was occluded.  He is in permanent atrial fibrillation on warfarin. Echo in 10/21 showed normal LV systolic function with moderately dilated/mildly dysfunctional RV and PASP 75 mmHg.  In 12/21, he was admitted with PNA and treated with antibiotics.  CTA chest showed no PE, RML PNA, and bilateral hilar + mediastinal lymphadenopathy.  He was sent home from this admission on home oxygen.   In 1/22, RHC/LHC was done.  This showed patent LIMA-LAD and SVG-D, occluded SVG-OM and occluded SVG-RCA, 95% calcified proximal RCA stenosis, totally occluded PLV with collaterals (known from prior).  There was moderate pulmonary hypertension with optimized left and right heart filling pressures.  I reviewed the cath with interventional cardiology, would require atherectomy to treat the RCA.  We decided to manage medically initially.   He stopped Jardiance due to penile yeast infection.   Echo 4/23 showed EF 55-60%, mild LVH, RV mildly reduced.   Admitted 10/23 with hematuria and inability to urinate. Urology consulted, foley placed and discharged home. Seen in the ED a week later with hematuria. Urology consulted and arranged traction on foley to help assist with tamponade.   Echo was done today and reviewed, EF 60-65% with mildly D-shaped septum, mild RV dilation with mildly decreased systolic function, IVC dilated, PASP 45 mmHg.   Today he returns for HF follow up. Still has trouble with urinary incontinence.   Wearing 3 L home oxygen at night.  Chronic episodes of chest tightness, not clearly exertional and not frequent.  This pattern has not changed.  He gets short of breath with long walks, does fine around the house.  He uses a walker for balance.  No orthopnea/PND.  Weight down 2 lbs.    Labs (12/21): K 3.8, creatinine 0.83, hgb 9.5 Labs (1/22): K 3.8, creatinine 1.22, LDL 49 Labs (2/22): K 3.6, creatinine 0.86 Labs (10/22): K 3.7, creatinine 0.75, BNP 153 Labs (1/23): LDL 62, HDL 37 Labs (9/23): K 3.6, creatinine 0.9, hgb 11.4 Labs (10/23): K 3.5, creatinine 0.81 Labs (2/24): K 3.8, creatinine 1.02  ECG (personally reviewed): Atrial fibrillation, iRBBB  PMH: 1. Atrial fibrillation: Permanent.  2. CAD: CABG 2000.  - LHC (2014) with totally occluded RCA, totally occluded SVG-RCA, totally occluded SVG-OM, atretic LIMA, patent SVG-D.   - LHC (1/22): patent LIMA-LAD and SVG-D, occluded SVG-OM and occluded SVG-RCA, 60-70% calcified ostial RCA stenosis and 95% calcified proximal RCA stenosis, totally occluded PLV with collaterals (known from prior). 3. HTN 4. PAD: s/p bilateral fem-pop bypasses.   - Peripheral arterial dopplers (7/21): Occluded right fem-pop, patent left fem-pop.  5. Type 2 diabetes 6. Prostate cancer: Treated with radiation. 7. PVCs 8. History of renal artery stenosis.  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  10. Chronic diastolic CHF: Echo (Q000111Q) with EF 60-65%, mildly decreased RV systolic function with moderate RV enlargement, severe biatrial enlargement, PASP 75 mmHg, dilated IVC.  V/Q scan 7/22 with no chronic PE.  - RHC (1/22): mean RA 6, PA 65/18 mean  35, mean PCWP 14, CI 2.71, PVR 3.7 WU.  - Echo (4/23): EF 55-60%, mild LVH, RV mildly reduced, severely elevated pulmonary artery systolic pressure 11. 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  - Abdominal US (8/22): 4.8 cm AAA - CT abd/pelvis (5/23): 4.8 cm AAA - CT abd/pelvis (2/24): 5 cm AAA 12. Carotid  stenosis: Carotid dopplers (123456) with 123456 LICA stenosis.  - Carotid dopplers ([id number]): 123456 LICA stenosis.  18. BPH  SH: Lives in [place] with wife.  Retired.  Remote smoker (>20 years ago). No ETOH.   Family History  Problem Relation Age of Onset   Deep vein thrombosis Father    Lung cancer Sister    Diabetes Sister    Heart disease Sister        After age 57   Hyperlipidemia Sister    Hypertension Sister    Lung cancer Sister    Breast cancer Sister    Hypertension Mother    Diabetes Sister    Coronary artery disease Other        family hx of   Cancer Brother        "crab cancer"   Heart disease Brother    Heart attack Brother    Heart attack Daughter    Colon cancer Neg Hx    Stroke Neg Hx    ROS: All systems reviewed and negative except as per HPI.   Current Outpatient Medications  Medication Sig Dispense Refill   acetaminophen (TYLENOL) 325 MG tablet Take 2 tablets (650 mg total) by mouth every 6 (six) hours as needed for mild pain, fever or headache (or Fever >/= 101). 12 tablet 0   albuterol (VENTOLIN HFA) 108 (90 Base) MCG/ACT inhaler Inhale 2 puffs into the lungs every 6 (six) hours as needed for wheezing or shortness of breath. 1 each 2   aspirin 81 MG EC tablet Take 1 tablet (81 mg total) by mouth daily with breakfast. 30 tablet 3   atorvastatin (LIPITOR) 40 MG tablet Take 1 tablet (40 mg total) by mouth daily. 90 tablet 1   Cholecalciferol (VITAMIN D3) 50 MCG (2000 UT) capsule Take 2,000 Units by mouth daily.     dutasteride (AVODART) 0.5 MG capsule Take 1 capsule (0.5 mg total) by mouth daily. 30 capsule 11   HYDROcodone-acetaminophen (NORCO) 10-325 MG tablet Take 1 tablet by mouth 2 (two) times daily as needed for moderate pain. 30 tablet 0   ipratropium (ATROVENT) 0.03 % nasal spray Place 2 sprays into both nostrils as needed for rhinitis.     latanoprost (XALATAN) 0.005 % ophthalmic solution Place 1 drop into both eyes at bedtime.     losartan (COZAAR)  25 MG tablet Take 1 tablet (25 mg total) by mouth daily. 30 tablet 4   metFORMIN (GLUCOPHAGE) 500 MG tablet Take 500 mg by mouth 2 (two) times daily.     metoprolol succinate (TOPROL-XL) 25 MG 24 hr tablet Take 12.5 mg by mouth daily.     nitroGLYCERIN (NITROSTAT) 0.4 MG SL tablet Place 1 tablet (0.4 mg total) under the tongue every 5 (five) minutes x 3 doses as needed for chest pain. 25 tablet 3   OXYGEN Inhale 3 L into the lungs at bedtime.     pantoprazole (PROTONIX) 40 MG tablet Take 1 tablet (40 mg total) by mouth daily. Take 30-60 min before first meal of the day     spironolactone (ALDACTONE) 25 MG tablet Take 1 tablet (25 mg total) by mouth daily.  90 tablet 2   tamsulosin (FLOMAX) 0.4 MG CAPS capsule Take 1 capsule (0.4 mg total) by mouth daily after supper. 90 capsule 3   torsemide (DEMADEX) 10 MG tablet Take 10-20 mg by mouth See admin instructions. Take 10 mg daily, may increase to 20 mg as needed for weight gain     warfarin (COUMADIN) 2 MG tablet Take 2 mg by mouth daily.     isosorbide mononitrate (IMDUR) 60 MG 24 hr tablet Take 2 tablets (120 mg total) by mouth every evening. 180 tablet 3   No current facility-administered medications for this encounter.   Wt Readings from Last 3 Encounters:  06/19/22 93.2 kg (205 lb 6.4 oz)  05/11/22 97.6 kg (215 lb 2.7 oz)  03/22/22 93.9 kg (207 lb 0.2 oz)   BP 120/70   Pulse (!) 57   Wt 93.2 kg (205 lb 6.4 oz)   SpO2 97%   BMI 29.47 kg/m  General: NAD Neck: JVP 7-8 cm, no thyromegaly or thyroid nodule.  Lungs: Clear to auscultation bilaterally with normal respiratory effort. CV: Nondisplaced PMI.  Heart irregular S1/S2, no S3/S4, no murmur.  Trace ankle edema.  No carotid bruit.  Normal pedal pulses.  Abdomen: Soft, nontender, no hepatosplenomegaly, no distention.  Skin: Intact without lesions or rashes.  Neurologic: Alert and oriented x 3.  Psych: Normal affect. Extremities: No clubbing or cyanosis.  HEENT: Normal.    Assessment/Plan: 1. Chronic diastolic CHF: Echo in Q000111Q with EF 60-65%, mildly decreased RV systolic function with moderate RV enlargement, severe biatrial enlargement, PASP 75 mmHg, dilated IVC. After increasing diuretics, RHC in 1/22 showed normal filling pressures with moderate pulmonary hypertension. Echo 4/23 with EF 60-65%, mildly enlarged RV with mildly decreased RV function. NYHA class II. Echo today showed EF 60-65% with mildly D-shaped septum, mild RV dilation with mildly decreased systolic function, IVC dilated, PASP 45 mmHg. On exam, he is minimally volume overloaded.  NYHA class II.  - Continue spironolactone 25 mg daily.  - Continue torsemide 10 mg daily. Check BMET/BNP today.  - Continue losartan 25 mg daily. - No Jardiance due to yeast infection.  2. 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  Moderate PH on RHC in 1/22.  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  Less likely group 1 PH.  No chronic PEs on 7/22 V/Q scan. 3. Chronic hypoxemic respiratory failure: Remote smoker.  Hilar and mediastinal lymphadenopathy on chest CT, no reported ILD.  Recurrent aspiration PNA in setting of chronic right vocal cord paralysis.  Was on home oxygen but able to stop after more effective diuresis, now just uses oxygen at night.  - Should follow with pulmonary.  4. CAD: s/p CABG in 2000.  Cath in 1/22 showed patent LIMA-LAD and SVG-D but occluded SVG-OM and SVG-RCA.  There was 60-70% ostial RCA stenosis and 95% proximal RCA stenosis with heavy calcification.  Discussed with interventional, with normal EF plan was to proceed with initial medical management and proceed to PCI if chest pain worsened (PCI would require atherectomy). Chronic mild atypical chest pain.     - Continue ASA 81 and atorvastatin, check lipids today.  - Continue Imdur 120 mg daily and Toprol XL 12.5 mg daily for angina control.  5. Atrial fibrillation: Permanent.  - On warfarin.  -  Continue Toprol XL 12.5 mg daily.  6. PAD: Occluded right fem-pop bypass.  No claudication.  - Follows with [facility]. 7. Carotid stenosis: 123456 LICA stenosis on  9/23 dopplers, followed at [facility].  8. AAA: 5 cm AAA on 2/24 abdominal CT.  - Will likely need repair soon, he should have appt soon with [facility], he is going to call to schedule. 9. Incontinence: Follows closely with urology.   Follow up in 4 months with APP, needs to scheduled [facility] appt.   Loralie Champagne  06/20/2022

## 2022-06-21 ENCOUNTER — Ambulatory Visit (INDEPENDENT_AMBULATORY_CARE_PROVIDER_SITE_OTHER): Payer: No Typology Code available for payment source | Admitting: Urology

## 2022-06-21 DIAGNOSIS — R32 Unspecified urinary incontinence: Secondary | ICD-10-CM

## 2022-06-21 NOTE — Progress Notes (Signed)
PTNS  Session # 7 of 12  Health & Social Factors: Diabetes and CHF Caffeine: 2 cup a day Alcohol: 2-3 .20 a week Daytime voids #per day: 7 Night-time voids #per night: 5 Urgency: yes Incontinence Episodes #per day: every time Ankle used: Right Treatment Setting: 2 Feeling/ Response: positive sensation Comments: N/A  Performed By: Marisue Brooklyn, CMA  Follow Up: Keep scheduled NV

## 2022-06-28 ENCOUNTER — Ambulatory Visit: Payer: No Typology Code available for payment source | Admitting: Urology

## 2022-06-28 DIAGNOSIS — R32 Unspecified urinary incontinence: Secondary | ICD-10-CM | POA: Diagnosis not present

## 2022-06-28 NOTE — Progress Notes (Addendum)
PTNS  Session # 8 of 12  Health & Social Factors: diabetes, CHF, fluid pills Caffeine: yes Alcohol: yes Daytime voids #per day: 6-7 Night-time voids #per night: 6-7  Urgency: yes Incontinence Episodes #per day: 2-3 Ankle used: left Treatment Setting: 2 Feeling/ Response: positive sensation  Comments: n/a  Performed By: Sandra Tellefsen LPN  Follow Up: keep scheduled NV

## 2022-07-02 ENCOUNTER — Ambulatory Visit: Payer: Medicare HMO

## 2022-07-05 ENCOUNTER — Ambulatory Visit (INDEPENDENT_AMBULATORY_CARE_PROVIDER_SITE_OTHER): Payer: No Typology Code available for payment source | Admitting: Urology

## 2022-07-05 DIAGNOSIS — R32 Unspecified urinary incontinence: Secondary | ICD-10-CM | POA: Diagnosis not present

## 2022-07-05 NOTE — Progress Notes (Signed)
PTNS  Session # 9 of 12  Health & Social Factors: Diabetes, fluid pills, and congestive heart failure Caffeine: 2 cups a day Alcohol: 2-3 OZ a week Daytime voids #per day: 6 Night-time voids #per night: 5 Urgency: Yes Incontinence Episodes #per day: every time Ankle used: right Treatment Setting: 3 Feeling/ Response: Positive response  Comments: NA  Performed By: Marisue Brooklyn, CMA  Follow Up: Keep Nurse Visit

## 2022-07-10 ENCOUNTER — Emergency Department (HOSPITAL_COMMUNITY)
Admission: EM | Admit: 2022-07-10 | Discharge: 2022-07-11 | Disposition: A | Discharge status: Home / Self Care | Payer: No Typology Code available for payment source | Attending: Emergency Medicine | Admitting: Emergency Medicine

## 2022-07-10 ENCOUNTER — Emergency Department (HOSPITAL_COMMUNITY): Payer: No Typology Code available for payment source

## 2022-07-10 ENCOUNTER — Other Ambulatory Visit: Payer: Self-pay

## 2022-07-10 DIAGNOSIS — Z7901 Long term (current) use of anticoagulants: Secondary | ICD-10-CM | POA: Insufficient documentation

## 2022-07-10 DIAGNOSIS — R0789 Other chest pain: Secondary | ICD-10-CM | POA: Diagnosis present

## 2022-07-10 DIAGNOSIS — R000 Tachycardia, unspecified: Secondary | ICD-10-CM | POA: Diagnosis not present

## 2022-07-10 DIAGNOSIS — Z955 Presence of coronary angioplasty implant and graft: Secondary | ICD-10-CM | POA: Insufficient documentation

## 2022-07-10 DIAGNOSIS — R600 Localized edema: Secondary | ICD-10-CM | POA: Diagnosis not present

## 2022-07-10 DIAGNOSIS — I4891 Unspecified atrial fibrillation: Secondary | ICD-10-CM | POA: Insufficient documentation

## 2022-07-10 DIAGNOSIS — E119 Type 2 diabetes mellitus without complications: Secondary | ICD-10-CM | POA: Diagnosis not present

## 2022-07-10 DIAGNOSIS — Z7984 Long term (current) use of oral hypoglycemic drugs: Secondary | ICD-10-CM | POA: Diagnosis not present

## 2022-07-10 DIAGNOSIS — I2510 Atherosclerotic heart disease of native coronary artery without angina pectoris: Secondary | ICD-10-CM | POA: Diagnosis not present

## 2022-07-10 DIAGNOSIS — I509 Heart failure, unspecified: Secondary | ICD-10-CM | POA: Insufficient documentation

## 2022-07-10 DIAGNOSIS — I110 Hypertensive heart disease with heart failure: Secondary | ICD-10-CM | POA: Insufficient documentation

## 2022-07-10 DIAGNOSIS — Z79899 Other long term (current) drug therapy: Secondary | ICD-10-CM | POA: Insufficient documentation

## 2022-07-10 DIAGNOSIS — I959 Hypotension, unspecified: Secondary | ICD-10-CM | POA: Diagnosis not present

## 2022-07-10 DIAGNOSIS — R079 Chest pain, unspecified: Secondary | ICD-10-CM | POA: Diagnosis not present

## 2022-07-10 DIAGNOSIS — R0689 Other abnormalities of breathing: Secondary | ICD-10-CM | POA: Diagnosis not present

## 2022-07-10 DIAGNOSIS — Z7982 Long term (current) use of aspirin: Secondary | ICD-10-CM | POA: Diagnosis not present

## 2022-07-10 MED ORDER — ASPIRIN 81 MG PO CHEW
324.0000 mg | CHEWABLE_TABLET | Freq: Once | ORAL | Status: AC
  Administered 2022-07-10: 324 mg via ORAL
  Filled 2022-07-10: qty 4

## 2022-07-10 MED ORDER — NITROGLYCERIN 0.4 MG SL SUBL
0.4000 mg | SUBLINGUAL_TABLET | SUBLINGUAL | Status: DC | PRN
  Administered 2022-07-11: 0.4 mg via SUBLINGUAL
  Filled 2022-07-10: qty 1

## 2022-07-10 NOTE — ED Provider Notes (Signed)
[place] EMERGENCY DEPARTMENT AT [facility] Provider Note   CSN: [id number] Arrival date & time: 07/10/22  2215     History {Add pertinent medical, surgical, social history, OB history to HPI:1} Chief Complaint  Patient presents with   Chest Pain    Troy [name] is a 82 y.o. male.  The history is provided by the patient and medical records.  Chest Pain  82 year old male with history of A-fib on Coumadin, coronary artery disease status post CABG, hyperlipidemia, hypertension, diabetes, CHF, presenting to the ED with chest discomfort.  Has been somewhat intermittent all day.  Took 2 of his home sublingual nitroglycerin around 3 PM today with relief.  EMS called this evening as symptoms were worsening with radiation down the left arm.  No pain into the neck.  No real shortness of breath, diaphoresis, nausea, or vomiting.  With EMS, heart rate dropped into the 30s (baseline in 50's) but patient asymptomatic of this.  He was given 1mg  atropine and 200cc IVF.  Patient does follow closely with cardiology, [name].  Last RHC 04/11/20: 1. Moderate pulmonary hypertension.  2. Normal left and right heart filling pressures.  3. Patent LIMA-LAD and SVG-D.  Occluded SVG-OM and SVG-RCA.  4. Native RCA with 60-70% calcified ostial stenosis and 95% proximal stenosis.  Occluded large PLV branch with collaterals (known from prior).    The ostial and proximal RCA disease is progressive from prior. Discussed with Dr. Okey Dupre, would be somewhat complicated intervention requiring atherectomy ostially potentially.  Recommend initial medical management (increase Imdur) and can readdress PCI if remains symptomatic.   Home Medications Prior to Admission medications   Medication Sig Start Date End Date Taking? Authorizing Provider  acetaminophen (TYLENOL) 325 MG tablet Take 2 tablets (650 mg total) by mouth every 6 (six) hours as needed for mild pain, fever or headache (or Fever >/= 101). 03/29/20    Shon Hale, MD  albuterol (VENTOLIN HFA) 108 (90 Base) MCG/ACT inhaler Inhale 2 puffs into the lungs every 6 (six) hours as needed for wheezing or shortness of breath. 03/29/20   Shon Hale, MD  aspirin 81 MG EC tablet Take 1 tablet (81 mg total) by mouth daily with breakfast. 03/29/20   Mariea Clonts, Courage, MD  atorvastatin (LIPITOR) 40 MG tablet Take 1 tablet (40 mg total) by mouth daily. 11/30/13   Luis Abed, MD  Cholecalciferol (VITAMIN D3) 50 MCG (2000 UT) capsule Take 2,000 Units by mouth daily.    [provider]  dutasteride (AVODART) 0.5 MG capsule Take 1 capsule (0.5 mg total) by mouth daily. 02/02/22   McKenzie, Mardene Celeste, MD  HYDROcodone-acetaminophen (NORCO) 10-325 MG tablet Take 1 tablet by mouth 2 (two) times daily as needed for moderate pain. 03/22/22   McKenzie, Mardene Celeste, MD  ipratropium (ATROVENT) 0.03 % nasal spray Place 2 sprays into both nostrils as needed for rhinitis.    [provider]  isosorbide mononitrate (IMDUR) 60 MG 24 hr tablet Take 2 tablets (120 mg total) by mouth every evening. 06/19/22   Laurey Morale, MD  latanoprost (XALATAN) 0.005 % ophthalmic solution Place 1 drop into both eyes at bedtime.    [provider]  losartan (COZAAR) 25 MG tablet Take 1 tablet (25 mg total) by mouth daily. 03/29/20   Shon Hale, MD  metFORMIN (GLUCOPHAGE) 500 MG tablet Take 500 mg by mouth 2 (two) times daily. 12/18/21   [provider]  metoprolol succinate (TOPROL-XL) 25 MG 24 hr tablet  Take 12.5 mg by mouth daily.    [provider]  nitroGLYCERIN (NITROSTAT) 0.4 MG SL tablet Place 1 tablet (0.4 mg total) under the tongue every 5 (five) minutes x 3 doses as needed for chest pain. 12/14/19   Dunn, Tacey Ruiz, PA-C  OXYGEN Inhale 3 L into the lungs at bedtime.    [provider]  pantoprazole (PROTONIX) 40 MG tablet Take 1 tablet (40 mg total) by mouth daily. Take 30-60 min before first meal of the day 06/07/20    Nyoka Cowden, MD  spironolactone (ALDACTONE) 25 MG tablet Take 1 tablet (25 mg total) by mouth daily. 03/16/22   [place], Anderson Malta, FNP  tamsulosin (FLOMAX) 0.4 MG CAPS capsule Take 1 capsule (0.4 mg total) by mouth daily after supper. 02/02/22   McKenzie, Mardene Celeste, MD  torsemide (DEMADEX) 10 MG tablet Take 10-20 mg by mouth See admin instructions. Take 10 mg daily, may increase to 20 mg as needed for weight gain    [provider]  warfarin (COUMADIN) 2 MG tablet Take 2 mg by mouth daily. 12/19/21   [provider]      Allergies    Niacin, Contrast media [iodinated contrast media], Iodine-131, and Nsaids    Review of Systems   Review of Systems  Cardiovascular:  Positive for chest pain.  All other systems reviewed and are negative.   Physical Exam Updated Vital Signs BP (!) 158/58   Pulse (!) 56   Temp 98.6 F (37 C) (Oral)   Resp 13   Ht 5\' 10"  (1.778 m)   Wt 93 kg   SpO2 93%   BMI 29.41 kg/m   Physical Exam Vitals and nursing note reviewed.  Constitutional:      Appearance: He is well-developed.  HENT:     Head: Normocephalic and atraumatic.  Eyes:     Conjunctiva/sclera: Conjunctivae normal.     Pupils: Pupils are equal, round, and reactive to light.  Cardiovascular:     Rate and Rhythm: Normal rate and regular rhythm.     Heart sounds: Normal heart sounds.  Pulmonary:     Effort: Pulmonary effort is normal.     Breath sounds: Normal breath sounds.  Abdominal:     General: Bowel sounds are normal.     Palpations: Abdomen is soft.  Musculoskeletal:        General: Normal range of motion.     Cervical back: Normal range of motion.     Comments: Trace edema BLE, grossly symmetric  Skin:    General: Skin is warm and dry.  Neurological:     Mental Status: He is alert and oriented to person, place, and time.     ED Results / Procedures / Treatments   Labs (all labs ordered are listed, but only abnormal results are displayed) Labs  Reviewed  CBC WITH DIFFERENTIAL/PLATELET  COMPREHENSIVE METABOLIC PANEL  PROTIME-INR  BRAIN NATRIURETIC PEPTIDE  TROPONIN I (HIGH SENSITIVITY)    EKG None  Radiology No results found.  Procedures Procedures  {Document cardiac monitor, telemetry assessment procedure when appropriate:1}  Medications Ordered in ED Medications  aspirin chewable tablet 324 mg (has no administration in time range)  nitroGLYCERIN (NITROSTAT) SL tablet 0.4 mg (has no administration in time range)    ED Course/ Medical Decision Making/ A&P   {   Click here for ABCD2, HEART and other calculatorsREFRESH Note before signing :1}  Medical Decision Making Amount and/or Complexity of Data Reviewed Labs: ordered. Radiology: ordered.  Risk OTC drugs. Prescription drug management.   ***  {Document critical care time when appropriate:1} {Document review of labs and clinical decision tools ie heart score, Chads2Vasc2 etc:1}  {Document your independent review of radiology images, and any outside records:1} {Document your discussion with family members, caretakers, and with consultants:1} {Document social determinants of health affecting pt's care:1} {Document your decision making why or why not admission, treatments were needed:1} Final Clinical Impression(s) / ED Diagnoses Final diagnoses:  None    Rx / DC Orders ED Discharge Orders     None

## 2022-07-10 NOTE — ED Triage Notes (Signed)
Pt arrived via [facility] for chest tightness/discomfort from home. Pt reports central chest discomfort through out the day. Pt took 2 SL Nitro around 3 pm today with some relief. Pt called EMS when at home monitor showed heart rate drop into the 30s (Baseline afib in the 50s), continued chest discomfort, and increased weakness. EMS noted afib in the 40s during transport, 1mg  Atropine, 200cc NS Bolus administered via 18g RAC. Rate increased to 60-70 bpm. A&Ox4, GCS 15.  PTA EMS Vitals  BP 165/86 HR 65 Afib 98% RA RR 20

## 2022-07-11 ENCOUNTER — Other Ambulatory Visit (HOSPITAL_COMMUNITY): Payer: Self-pay | Admitting: Cardiology

## 2022-07-11 ENCOUNTER — Telehealth (HOSPITAL_COMMUNITY): Payer: Self-pay

## 2022-07-11 DIAGNOSIS — R079 Chest pain, unspecified: Secondary | ICD-10-CM

## 2022-07-11 DIAGNOSIS — R001 Bradycardia, unspecified: Secondary | ICD-10-CM

## 2022-07-11 LAB — CBC WITH DIFFERENTIAL/PLATELET
Abs Immature Granulocytes: 0.06 10*3/uL (ref 0.00–0.07)
Basophils Absolute: 0.1 10*3/uL (ref 0.0–0.1)
Basophils Relative: 1 %
Eosinophils Absolute: 0.2 10*3/uL (ref 0.0–0.5)
Eosinophils Relative: 3 %
HCT: 36.4 % — ABNORMAL LOW (ref 39.0–52.0)
Hemoglobin: 11.7 g/dL — ABNORMAL LOW (ref 13.0–17.0)
Immature Granulocytes: 1 %
Lymphocytes Relative: 27 %
Lymphs Abs: 2.1 10*3/uL (ref 0.7–4.0)
MCH: 28.9 pg (ref 26.0–34.0)
MCHC: 32.1 g/dL (ref 30.0–36.0)
MCV: 89.9 fL (ref 80.0–100.0)
Monocytes Absolute: 0.7 10*3/uL (ref 0.1–1.0)
Monocytes Relative: 9 %
Neutro Abs: 4.6 10*3/uL (ref 1.7–7.7)
Neutrophils Relative %: 59 %
Platelets: 176 10*3/uL (ref 150–400)
RBC: 4.05 MIL/uL — ABNORMAL LOW (ref 4.22–5.81)
RDW: 17.4 % — ABNORMAL HIGH (ref 11.5–15.5)
WBC: 7.7 10*3/uL (ref 4.0–10.5)
nRBC: 0 % (ref 0.0–0.2)

## 2022-07-11 LAB — TROPONIN I (HIGH SENSITIVITY)
Troponin I (High Sensitivity): 12 ng/L (ref <18)
Troponin I (High Sensitivity): 9 ng/L (ref <18)
Troponin I (High Sensitivity): 13 ng/L (ref <18)

## 2022-07-11 LAB — BRAIN NATRIURETIC PEPTIDE: B Natriuretic Peptide: 175.3 pg/mL — ABNORMAL HIGH (ref 0.0–100.0)

## 2022-07-11 LAB — COMPREHENSIVE METABOLIC PANEL
ALT: 8 U/L (ref 0–44)
AST: 19 U/L (ref 15–41)
Albumin: 3.4 g/dL — ABNORMAL LOW (ref 3.5–5.0)
Alkaline Phosphatase: 52 U/L (ref 38–126)
Anion gap: 12 (ref 5–15)
BUN: 17 mg/dL (ref 8–23)
CO2: 25 mmol/L (ref 22–32)
Calcium: 8.6 mg/dL — ABNORMAL LOW (ref 8.9–10.3)
Chloride: 101 mmol/L (ref 98–111)
Creatinine, Ser: 1.07 mg/dL (ref 0.61–1.24)
GFR, Estimated: >60 mL/min (ref >60)
Glucose, Bld: 110 mg/dL — ABNORMAL HIGH (ref 70–99)
Potassium: 3.6 mmol/L (ref 3.5–5.1)
Sodium: 138 mmol/L (ref 135–145)
Total Bilirubin: 0.8 mg/dL (ref 0.3–1.2)
Total Protein: 6.5 g/dL (ref 6.5–8.1)

## 2022-07-11 LAB — PROTIME-INR
INR: 3.5 — ABNORMAL HIGH (ref 0.8–1.2)
Prothrombin Time: 34.8 seconds — ABNORMAL HIGH (ref 11.4–15.2)

## 2022-07-11 NOTE — Telephone Encounter (Signed)
Patient called and states his HR is sometimes in the 30's,  but right now is 40. He is concerned about medications causing his low HR. I suggested going back to ED to be evaluated, but he would like someone just to look at his medications and see if anything can be stopped.

## 2022-07-11 NOTE — Discharge Instructions (Signed)
Your cardiac work-up today was reassuring.  Your HR has remained stable to your baseline here in the ED. Follow-up with [name]- I have sent him a message about this ER visit to expedite some follow-up. Return here for new concerns.

## 2022-07-12 ENCOUNTER — Ambulatory Visit (HOSPITAL_COMMUNITY)
Admission: RE | Admit: 2022-07-12 | Discharge: 2022-07-12 | Disposition: A | Discharge status: Home / Self Care | Payer: Medicare HMO | Source: Ambulatory Visit | Attending: Cardiology | Admitting: Cardiology

## 2022-07-12 ENCOUNTER — Ambulatory Visit (INDEPENDENT_AMBULATORY_CARE_PROVIDER_SITE_OTHER): Payer: No Typology Code available for payment source | Admitting: Urology

## 2022-07-12 DIAGNOSIS — R32 Unspecified urinary incontinence: Secondary | ICD-10-CM | POA: Diagnosis not present

## 2022-07-12 DIAGNOSIS — Z79899 Other long term (current) drug therapy: Secondary | ICD-10-CM | POA: Diagnosis not present

## 2022-07-12 DIAGNOSIS — R079 Chest pain, unspecified: Secondary | ICD-10-CM

## 2022-07-12 NOTE — Telephone Encounter (Signed)
Since Shanda Bumps is off today, can you take a look at this?

## 2022-07-12 NOTE — Telephone Encounter (Signed)
Left message for patient to call back for instructions. 

## 2022-07-12 NOTE — Telephone Encounter (Signed)
Order for live zio placed

## 2022-07-12 NOTE — Telephone Encounter (Signed)
Pt called and aware

## 2022-07-12 NOTE — Addendum Note (Signed)
Addended by: Theresia Bough on: 07/12/2022 10:35 AM   Modules accepted: Orders

## 2022-07-12 NOTE — Telephone Encounter (Signed)
Stop metoprolol.  If not lightheaded, does not have to go to ER.  Needs to come in to get Zio monitor x 2 wks placed today.  Needs to be the live [place].

## 2022-07-12 NOTE — Progress Notes (Signed)
PTNS  Session # 10 of 12  Health & Social Factors: Heart failure and fluid pills Caffeine: 2 cups a day Alcohol: n/a Daytime voids #per day: 6 Night-time voids #per night: 5 Urgency: yes Incontinence Episodes #per day: everytime Ankle used: right Treatment Setting: 3 Feeling/ Response: positive sensation Comments: N/A  Performed By: Kennyth Lose, CMA  Follow Up: Keep Scheduled Nurse visit

## 2022-07-13 ENCOUNTER — Ambulatory Visit: Payer: Medicare HMO

## 2022-07-16 ENCOUNTER — Telehealth: Payer: Self-pay

## 2022-07-16 NOTE — Telephone Encounter (Signed)
        Patient  visited [place] on 4/10     Telephone encounter attempt :  1st  Patient declined call   Lenard Forth [facility] Guide, [facility] [phone] [place], [place], [place] [address] Phone: [phone] Email: [email or web address]

## 2022-07-17 ENCOUNTER — Ambulatory Visit: Payer: Medicare HMO

## 2022-07-19 ENCOUNTER — Ambulatory Visit (INDEPENDENT_AMBULATORY_CARE_PROVIDER_SITE_OTHER): Payer: Medicare HMO

## 2022-07-19 DIAGNOSIS — R32 Unspecified urinary incontinence: Secondary | ICD-10-CM

## 2022-07-19 NOTE — Progress Notes (Signed)
PTNS  Session # 11 of 12  Health & Social Factors: diabetes, taking fluid pills, CHF Caffeine: 2 cups a day Alcohol: 30oz a week Daytime voids #per day: 6 Night-time voids #per night: 5 Urgency: yes Incontinence Episodes #per day: everytime Ankle used: right Treatment Setting: 2 Feeling/ Response: tingling up leg Comments: n/a  Performed By: Blase Mess, CMA  Follow Up: Follow up as scheduled.

## 2022-07-23 NOTE — Progress Notes (Signed)
PCP: Assunta Found, MD HF Cardiology: [name]  82 y.o. with history of permanent atrial fibrillation, CAD s/p CABG 2000, PAD, chronic diastolic CHF was referred to CHF clinic by Ronie Spies, PA, for evaluation of CHF and pulmonary hypertension.  Patient had CABG in 2000, last cath was in 2014 showing occluded SVG-OM, occluded SVG-PDA, occluded RCA, and atretic LIMA.  SVG-D was patent and native LAD was patent.  Patient had bilateral fem-pop bypasses, peripheral dopplers in 7/21 showed that the right fem-pop was occluded.  He is in permanent atrial fibrillation on warfarin. Echo in 10/21 showed normal LV systolic function with moderately dilated/mildly dysfunctional RV and PASP 75 mmHg.  In 12/21, he was admitted with PNA and treated with antibiotics.  CTA chest showed no PE, RML PNA, and bilateral hilar + mediastinal lymphadenopathy.  He was sent home from this admission on home oxygen.   In 1/22, RHC/LHC was done.  This showed patent LIMA-LAD and SVG-D, occluded SVG-OM and occluded SVG-RCA, 95% calcified proximal RCA stenosis, totally occluded PLV with collaterals (known from prior).  There was moderate pulmonary hypertension with optimized left and right heart filling pressures.  I reviewed the cath with interventional cardiology, would require atherectomy to treat the RCA.  We decided to manage medically initially.   He stopped Jardiance due to penile yeast infection.   Echo 4/23 showed EF 55-60%, mild LVH, RV mildly reduced.   Admitted 10/23 with hematuria and inability to urinate. Urology consulted, foley placed and discharged home. Seen in the ED a week later with hematuria. Urology consulted and arranged traction on foley to help assist with tamponade.   Echo 3/24 showed EF 60-65% with mildly D-shaped septum, mild RV dilation with mildly decreased systolic function, IVC dilated, PASP 45 mmHg.   Seen in ED 07/10/22 with CP. HR 30 when EMS arrived, asymptomatic. He was given IV atropine and 200  cc IVF bolus. HsTroponins x 3 negative, HR 50's. Live Zio 2 week placed 4/24.   Today he returns for HF follow up with his daughter. Overall feeling fine. He is not physically active but no SOB walking with his walker around the house. He has HH PT twice a week. He continues with chronic, infrequent episodes of chest tightness, not clearly exertional. This pattern has not changed. Denies palpitations, palpitations, dizziness, edema, or PND/Orthopnea. Appetite ok. No fever or chills. Weight at home 201-204 pounds. Taking all medications. Wearing 3 L home oxygen at night.  Rarely takes extra torsemide once every 3-4 weeks. Daughter wants him to get out of the house and be more active.  Labs (12/21): K 3.8, creatinine 0.83, hgb 9.5 Labs (1/22): K 3.8, creatinine 1.22, LDL 49 Labs (2/22): K 3.6, creatinine 0.86 Labs (10/22): K 3.7, creatinine 0.75, BNP 153 Labs (1/23): LDL 62, HDL 37 Labs (9/23): K 3.6, creatinine 0.9, hgb 11.4 Labs (10/23): K 3.5, creatinine 0.81 Labs (2/24): K 3.8, creatinine 1.02 Labs (3/24): K 3.6, creatinine 1.07  ECG (personally reviewed):   PMH: 1. Atrial fibrillation: Permanent.  2. CAD: CABG 2000.  - LHC (2014) with totally occluded RCA, totally occluded SVG-RCA, totally occluded SVG-OM, atretic LIMA, patent SVG-D.   - LHC (1/22): patent LIMA-LAD and SVG-D, occluded SVG-OM and occluded SVG-RCA, 60-70% calcified ostial RCA stenosis and 95% calcified proximal RCA stenosis, totally occluded PLV with collaterals (known from prior). 3. HTN 4. PAD: s/p bilateral fem-pop bypasses.   - Peripheral arterial dopplers (7/21): Occluded right fem-pop, patent left fem-pop.  5. Type 2 diabetes  6. Prostate cancer: Treated with radiation. 7. PVCs 8. History of renal artery stenosis.  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  10. Chronic diastolic CHF: Echo (10/21) with EF 60-65%, mildly decreased RV systolic function with moderate RV enlargement, severe biatrial  enlargement, PASP 75 mmHg, dilated IVC.  V/Q scan 7/22 with no chronic PE.  - RHC (1/22): mean RA 6, PA 65/18 mean 35, mean PCWP 14, CI 2.71, PVR 3.7 WU.  - Echo (4/23): EF 55-60%, mild LVH, RV mildly reduced, severely elevated pulmonary artery systolic pressure 11. 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  - Abdominal US (8/22): 4.8 cm AAA - CT abd/pelvis (5/23): 4.8 cm AAA - CT abd/pelvis (2/24): 5 cm AAA 12. Carotid stenosis: Carotid dopplers (8/22) with 40-59% LICA stenosis.  - Carotid dopplers (9/23): 40-59% LICA stenosis.  13. BPH  SH: Lives in [place] with wife.  Retired.  Remote smoker (>20 years ago). No ETOH.   Family History  Problem Relation Age of Onset   Deep vein thrombosis Father    Lung cancer Sister    Diabetes Sister    Heart disease Sister        After age 95   Hyperlipidemia Sister    Hypertension Sister    Lung cancer Sister    Breast cancer Sister    Hypertension Mother    Diabetes Sister    Coronary artery disease Other        family hx of   Cancer Brother        "crab cancer"   Heart disease Brother    Heart attack Brother    Heart attack Daughter    Colon cancer Neg Hx    Stroke Neg Hx    ROS: All systems reviewed and negative except as per HPI.   Current Outpatient Medications  Medication Sig Dispense Refill   acetaminophen (TYLENOL) 325 MG tablet Take 2 tablets (650 mg total) by mouth every 6 (six) hours as needed for mild pain, fever or headache (or Fever >/= 101). 12 tablet 0   albuterol (VENTOLIN HFA) 108 (90 Base) MCG/ACT inhaler Inhale 2 puffs into the lungs every 6 (six) hours as needed for wheezing or shortness of breath. 1 each 2   aspirin 81 MG EC tablet Take 1 tablet (81 mg total) by mouth daily with breakfast. 30 tablet 3   atorvastatin (LIPITOR) 40 MG tablet Take 1 tablet (40 mg total) by mouth daily. 90 tablet 1   Cholecalciferol (VITAMIN D3) 50 MCG (2000 UT) capsule Take 2,000 Units by mouth daily.     dutasteride (AVODART) 0.5 MG capsule  Take 1 capsule (0.5 mg total) by mouth daily. 30 capsule 11   HYDROcodone-acetaminophen (NORCO) 10-325 MG tablet Take 1 tablet by mouth 2 (two) times daily as needed for moderate pain. 30 tablet 0   ipratropium (ATROVENT) 0.03 % nasal spray Place 2 sprays into both nostrils as needed for rhinitis.     isosorbide mononitrate (IMDUR) 60 MG 24 hr tablet Take 2 tablets (120 mg total) by mouth every evening. 180 tablet 3   latanoprost (XALATAN) 0.005 % ophthalmic solution Place 1 drop into both eyes at bedtime.     losartan (COZAAR) 25 MG tablet Take 1 tablet (25 mg total) by mouth daily. 30 tablet 4   metFORMIN (GLUCOPHAGE) 500 MG tablet Take 500 mg by mouth 2 (two) times daily.     nitroGLYCERIN (NITROSTAT) 0.4 MG SL tablet Place 1 tablet (0.4 mg total)  under the tongue every 5 (five) minutes x 3 doses as needed for chest pain. 25 tablet 3   OXYGEN Inhale 3 L into the lungs at bedtime.     pantoprazole (PROTONIX) 40 MG tablet Take 1 tablet (40 mg total) by mouth daily. Take 30-60 min before first meal of the day     spironolactone (ALDACTONE) 25 MG tablet Take 1 tablet (25 mg total) by mouth daily. 90 tablet 2   tamsulosin (FLOMAX) 0.4 MG CAPS capsule Take 1 capsule (0.4 mg total) by mouth daily after supper. 90 capsule 3   torsemide (DEMADEX) 10 MG tablet Take 10-20 mg by mouth See admin instructions. Take 10 mg daily, may increase to 20 mg as needed for weight gain     warfarin (COUMADIN) 2 MG tablet Take 2 mg by mouth daily.     No current facility-administered medications for this encounter.   Wt Readings from Last 3 Encounters:  07/27/22 95.7 kg (211 lb)  07/10/22 93 kg (205 lb)  06/19/22 93.2 kg (205 lb 6.4 oz)   BP (!) 166/66   Pulse 62   Wt 95.7 kg (211 lb)   SpO2 92%   BMI 30.28 kg/m  Physical Exam General:  NAD. No resp difficulty, walked into clinic with rolling walker, elderly. HEENT: Normal Neck: Supple. No JVD. Carotids 2+ bilat; no bruits. No lymphadenopathy or thryomegaly  appreciated. Cor: PMI nondisplaced. Irregular rate & rhythm. No rubs, gallops or murmurs. Lungs: Clear Abdomen: Soft, nontender, nondistended. No hepatosplenomegaly. No bruits or masses. Good bowel sounds. Extremities: No cyanosis, clubbing, rash, edema Neuro: Alert & oriented x 3, cranial nerves grossly intact. Moves all 4 extremities w/o difficulty. Affect pleasant.  Assessment/Plan: 1. Chronic diastolic CHF: Echo in 10/21 with EF 60-65%, mildly decreased RV systolic function with moderate RV enlargement, severe biatrial enlargement, PASP 75 mmHg, dilated IVC. After increasing diuretics, RHC in 1/22 showed normal filling pressures with moderate pulmonary hypertension. Echo 4/23 with EF 60-65%, mildly enlarged RV with mildly decreased RV function. Echo 3/24 showed EF 60-65% with mildly D-shaped septum, mild RV dilation with mildly decreased systolic function, IVC dilated, PASP 45 mmHg. NYHA class II, he is not volume overloaded on exam.  - Continue spironolactone 25 mg daily.  - Continue torsemide 10 mg daily. - Continue losartan 25 mg daily. - No Jardiance due to yeast infection.  2. 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  Moderate PH on RHC in 1/22.  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  Less likely group 1 PH.  No chronic PEs on 7/22 V/Q scan. 3. Chronic hypoxemic respiratory failure: Remote smoker.  Hilar and mediastinal lymphadenopathy on chest CT, no reported ILD.  Recurrent aspiration PNA in setting of chronic right vocal cord paralysis.  Was on home oxygen but able to stop after more effective diuresis, now just uses oxygen at night.  - Should follow with pulmonary.  4. CAD: s/p CABG in 2000.  Cath in 1/22 showed patent LIMA-LAD and SVG-D but occluded SVG-OM and SVG-RCA.  There was 60-70% ostial RCA stenosis and 95% proximal RCA stenosis with heavy calcification.  Discussed with interventional, with normal EF plan was to proceed with initial  medical management and proceed to PCI if chest pain worsened (PCI would require atherectomy). Chronic mild atypical chest pain.     - Continue ASA 81 and atorvastatin, good lipids 3/24 - Continue Imdur 120 mg daily for angina control.  - Off Toprol with bradycardia.  5. Atrial fibrillation: Permanent.  - On warfarin.  6. PAD: Occluded right fem-pop bypass.  No claudication.  - Follows with [facility]. 7. Carotid stenosis: 40-59% LICA stenosis on 9/23 dopplers, followed at [facility].  8. AAA: 5 cm AAA on 2/24 abdominal CT.  - Will likely need repair soon. I asked him to call again to schedule with [facility]. 9. Incontinence: Follows closely with urology.  10. Bradycardia: Given IV atropine recently but EMS with HR 30's; he was asymptomatic. - Now off beta blocker. - Wearing Live Zio monitor, await results.  11. Physical Deconditioning: Continue HH PT. Discussed referral to CR when PT finished. He declined and wants to try Silver Sneakers.  Follow up in 4 months with APP.  Anderson [facility] FNP-BC 07/27/2022

## 2022-07-26 ENCOUNTER — Ambulatory Visit: Payer: Medicare HMO

## 2022-07-27 ENCOUNTER — Ambulatory Visit (HOSPITAL_COMMUNITY)
Admission: RE | Admit: 2022-07-27 | Discharge: 2022-07-27 | Disposition: A | Discharge status: Home / Self Care | Payer: Medicare HMO | Source: Ambulatory Visit | Attending: Family Medicine | Admitting: Family Medicine

## 2022-07-27 ENCOUNTER — Encounter (HOSPITAL_COMMUNITY): Payer: Self-pay

## 2022-07-27 VITALS — BP 166/66 | HR 62 | Wt 211.0 lb

## 2022-07-27 DIAGNOSIS — R001 Bradycardia, unspecified: Secondary | ICD-10-CM | POA: Diagnosis not present

## 2022-07-27 DIAGNOSIS — Z7984 Long term (current) use of oral hypoglycemic drugs: Secondary | ICD-10-CM | POA: Insufficient documentation

## 2022-07-27 DIAGNOSIS — E119 Type 2 diabetes mellitus without complications: Secondary | ICD-10-CM | POA: Insufficient documentation

## 2022-07-27 DIAGNOSIS — I25119 Atherosclerotic heart disease of native coronary artery with unspecified angina pectoris: Secondary | ICD-10-CM | POA: Diagnosis not present

## 2022-07-27 DIAGNOSIS — R5381 Other malaise: Secondary | ICD-10-CM

## 2022-07-27 DIAGNOSIS — Z833 Family history of diabetes mellitus: Secondary | ICD-10-CM | POA: Diagnosis not present

## 2022-07-27 DIAGNOSIS — I2510 Atherosclerotic heart disease of native coronary artery without angina pectoris: Secondary | ICD-10-CM | POA: Diagnosis not present

## 2022-07-27 DIAGNOSIS — J3801 Paralysis of vocal cords and larynx, unilateral: Secondary | ICD-10-CM | POA: Diagnosis not present

## 2022-07-27 DIAGNOSIS — R32 Unspecified urinary incontinence: Secondary | ICD-10-CM | POA: Diagnosis not present

## 2022-07-27 DIAGNOSIS — Z8546 Personal history of malignant neoplasm of prostate: Secondary | ICD-10-CM | POA: Insufficient documentation

## 2022-07-27 DIAGNOSIS — Z8249 Family history of ischemic heart disease and other diseases of the circulatory system: Secondary | ICD-10-CM | POA: Insufficient documentation

## 2022-07-27 DIAGNOSIS — I4821 Permanent atrial fibrillation: Secondary | ICD-10-CM | POA: Insufficient documentation

## 2022-07-27 DIAGNOSIS — I5032 Chronic diastolic (congestive) heart failure: Secondary | ICD-10-CM | POA: Diagnosis not present

## 2022-07-27 DIAGNOSIS — Z87891 Personal history of nicotine dependence: Secondary | ICD-10-CM | POA: Insufficient documentation

## 2022-07-27 DIAGNOSIS — I6529 Occlusion and stenosis of unspecified carotid artery: Secondary | ICD-10-CM

## 2022-07-27 DIAGNOSIS — I272 Pulmonary hypertension, unspecified: Secondary | ICD-10-CM | POA: Insufficient documentation

## 2022-07-27 DIAGNOSIS — J9611 Chronic respiratory failure with hypoxia: Secondary | ICD-10-CM

## 2022-07-27 DIAGNOSIS — Z79899 Other long term (current) drug therapy: Secondary | ICD-10-CM | POA: Insufficient documentation

## 2022-07-27 DIAGNOSIS — Z7982 Long term (current) use of aspirin: Secondary | ICD-10-CM | POA: Diagnosis not present

## 2022-07-27 DIAGNOSIS — Z7901 Long term (current) use of anticoagulants: Secondary | ICD-10-CM | POA: Diagnosis not present

## 2022-07-27 DIAGNOSIS — Z951 Presence of aortocoronary bypass graft: Secondary | ICD-10-CM | POA: Insufficient documentation

## 2022-07-27 DIAGNOSIS — I714 Abdominal aortic aneurysm, without rupture, unspecified: Secondary | ICD-10-CM

## 2022-07-27 DIAGNOSIS — I739 Peripheral vascular disease, unspecified: Secondary | ICD-10-CM | POA: Diagnosis not present

## 2022-07-27 DIAGNOSIS — I110 Hypertensive heart disease with heart failure: Secondary | ICD-10-CM | POA: Insufficient documentation

## 2022-07-27 DIAGNOSIS — I25810 Atherosclerosis of coronary artery bypass graft(s) without angina pectoris: Secondary | ICD-10-CM | POA: Insufficient documentation

## 2022-07-27 LAB — BASIC METABOLIC PANEL
Anion gap: 10 (ref 5–15)
BUN: 24 mg/dL — ABNORMAL HIGH (ref 8–23)
CO2: 24 mmol/L (ref 22–32)
Calcium: 9.1 mg/dL (ref 8.9–10.3)
Chloride: 103 mmol/L (ref 98–111)
Creatinine, Ser: 1.11 mg/dL (ref 0.61–1.24)
GFR, Estimated: >60 mL/min (ref >60)
Glucose, Bld: 105 mg/dL — ABNORMAL HIGH (ref 70–99)
Potassium: 4.4 mmol/L (ref 3.5–5.1)
Sodium: 137 mmol/L (ref 135–145)

## 2022-07-27 LAB — BRAIN NATRIURETIC PEPTIDE: B Natriuretic Peptide: 150.2 pg/mL — ABNORMAL HIGH (ref 0.0–100.0)

## 2022-07-27 NOTE — Patient Instructions (Addendum)
Thank you for coming in today  Labs were done today, if any labs are abnormal the clinic will call you No news is good news  Medications: No changes  Follow up appointments:  Your physician recommends that you schedule a follow-up appointment in:  4 months with Dr. Mclean  You will receive a reminder letter in the mail a few months in advance. If you don't receive a letter, please call our office to schedule the follow-up appointment.    Do the following things EVERYDAY: Weigh yourself in the morning before breakfast. Write it down and keep it in a log. Take your medicines as prescribed Eat low salt foods--Limit salt (sodium) to 2000 mg per day.  Stay as active as you can everyday Limit all fluids for the day to less than 2 liters   At the [facility], you and your health needs are our priority. As part of our continuing mission to provide you with exceptional heart care, we have created designated Provider Care Teams. These Care Teams include your primary Cardiologist (physician) and Advanced Practice Providers (APPs- Physician Assistants and Nurse Practitioners) who all work together to provide you with the care you need, when you need it.   You may see any of the following providers on your designated Care Team at your next follow up: Dr Daniel Bensimhon Dr Dalton McLean Dr. Aditya Sabharwal Amy Clegg, NP Brittainy Simmons, PA Jessica Milford,NP Lindsay Finch, PA Alma Diaz, NP Lauren Kemp, PharmD   Please be sure to bring in all your medications bottles to every appointment.    Thank you for choosing [facility]  If you have any questions or concerns before your next appointment please send us a message through mychart or call our office at [phone].    TO LEAVE A MESSAGE FOR THE NURSE SELECT OPTION 2, PLEASE LEAVE A MESSAGE INCLUDING: YOUR NAME DATE OF BIRTH CALL BACK NUMBER REASON FOR CALL**this is  important as we prioritize the call backs  YOU WILL RECEIVE A CALL BACK THE SAME DAY AS LONG AS YOU CALL BEFORE 4:00 PM   

## 2022-07-31 DIAGNOSIS — E1122 Type 2 diabetes mellitus with diabetic chronic kidney disease: Secondary | ICD-10-CM | POA: Diagnosis not present

## 2022-07-31 DIAGNOSIS — I129 Hypertensive chronic kidney disease with stage 1 through stage 4 chronic kidney disease, or unspecified chronic kidney disease: Secondary | ICD-10-CM | POA: Diagnosis not present

## 2022-07-31 DIAGNOSIS — N182 Chronic kidney disease, stage 2 (mild): Secondary | ICD-10-CM | POA: Diagnosis not present

## 2022-08-07 ENCOUNTER — Other Ambulatory Visit: Payer: Self-pay

## 2022-08-07 DIAGNOSIS — I7143 Infrarenal abdominal aortic aneurysm, without rupture: Secondary | ICD-10-CM

## 2022-08-07 DIAGNOSIS — Z91041 Radiographic dye allergy status: Secondary | ICD-10-CM

## 2022-08-07 MED ORDER — PREDNISONE 50 MG PO TABS
50.0000 mg | ORAL_TABLET | ORAL | 0 refills | Status: DC
Start: 2022-08-07 — End: 2022-08-29

## 2022-08-09 ENCOUNTER — Ambulatory Visit (INDEPENDENT_AMBULATORY_CARE_PROVIDER_SITE_OTHER): Payer: No Typology Code available for payment source

## 2022-08-09 DIAGNOSIS — R079 Chest pain, unspecified: Secondary | ICD-10-CM | POA: Diagnosis not present

## 2022-08-09 DIAGNOSIS — R32 Unspecified urinary incontinence: Secondary | ICD-10-CM

## 2022-08-09 NOTE — Progress Notes (Signed)
PTNS  Session # 12 of 12  Health & Social Factors: Diabetes and CHF Caffeine: 2 cups a day Alcohol: 2 drink a week Daytime voids #per day: 6 Night-time voids #per night: 6 Urgency: yes Incontinence Episodes #per day: every time Ankle used: Right Treatment Setting: 5 Feeling/ Response: positive response Comments: N/A  Performed By: Kennyth Lose, CMA  Follow Up: Keep scheduled OV

## 2022-08-10 NOTE — Addendum Note (Signed)
Encounter addended by: Crissie Figures, RN on: 08/10/2022 3:57 PM  Actions taken: Imaging Exam ended

## 2022-08-13 ENCOUNTER — Telehealth (HOSPITAL_COMMUNITY): Payer: Self-pay | Admitting: *Deleted

## 2022-08-13 NOTE — Telephone Encounter (Signed)
Called patient per [name] with following results of Zio:   "Rate-controlled atrial fibrillation."   Pt verbalized understanding of same. He will call in July to schedule his next appointment.

## 2022-08-15 ENCOUNTER — Encounter: Payer: Self-pay | Admitting: Urology

## 2022-08-15 ENCOUNTER — Ambulatory Visit: Payer: No Typology Code available for payment source | Admitting: Urology

## 2022-08-15 VITALS — BP 121/67 | HR 66

## 2022-08-15 DIAGNOSIS — R310 Gross hematuria: Secondary | ICD-10-CM

## 2022-08-15 DIAGNOSIS — N3941 Urge incontinence: Secondary | ICD-10-CM | POA: Diagnosis not present

## 2022-08-15 DIAGNOSIS — R32 Unspecified urinary incontinence: Secondary | ICD-10-CM

## 2022-08-15 NOTE — Patient Instructions (Signed)

## 2022-08-15 NOTE — Progress Notes (Signed)
08/15/2022 3:15 PM   Troy [name] May 11, 1940 [id number]  Referring provider: Assunta Found, MD [address] [place],  [place] [address]  Urinary incontinence and gross hematuria   HPI: Troy [name] is a 82yo here for followup for urinary incontinence and gross hematuria. He has rare gross hematuria since visit. He has constant urinary incontinence. He has tried multiple anticholinergics and beta 3s with no improvement. He tried PTNS which failed to improve his incontinence   PMH: Past Medical History:  Diagnosis Date   AAA (abdominal aortic aneurysm) (HCC)    Followed by Dr. Tawanna Cooler Early   Arthritis    CAD (coronary artery disease)    a. CABG 2000.   Cancer [facility])    Prostate:  Radiation Tx   Carotid artery disease (HCC)    Chest pain    precordial. mild chronic .[name]... nonischemic   CHF (congestive heart failure) (HCC)    Chronic edema    Coronary artery disease    a. Nuclear, January, 2008, no ischemia b. Cath 08/2012- 1/4 patent grafts, RCA CTO, no flow-limiting disease, medically managed   Diabetes mellitus without complication (HCC)    Dizziness 02/2011   Dyslipidemia    Fall    GERD (gastroesophageal reflux disease)    TAKES TUMS & ROLAIDS AS NEEDED   Heart murmur    Hx of CABG 2000   Hypertension    Myocardial infarction [facility]) 1999   Neck pain 02/2011   Pneumonia    Pulmonary hypertension (HCC)    Renal artery stenosis (HCC)    50-70%   S/P femoropopliteal bypass surgery    Dr. Arbie Cookey   Sinus bradycardia    Asymptomatic    Surgical History: Past Surgical History:  Procedure Laterality Date   CARDIAC CATHETERIZATION  09/26/2012   1/4 patent bypass (occluded SVG-PDA, SVG-OM, LIMA-LAD), SVG-diagonal patent and fills the diagonal and LAD, distal RCA occlusion with left to right collateralization, patent circumflex, LAD with no flow-limiting disease and antegrade flow competitively from SVG-diagonal; EF 60-65%   COLONOSCOPY  11/30/2009   AOZ:[id number].  next TCS 11/2019   COLONOSCOPY WITH PROPOFOL N/A 12/18/2019   Procedure: COLONOSCOPY WITH PROPOFOL;  Surgeon: Dolores Frame, MD;  Location: [facility];  Service: Gastroenterology;  Laterality: N/A;  1030   CORONARY ARTERY BYPASS GRAFT  2000   CYSTOSCOPY N/A 08/31/2021   Procedure: CYSTOSCOPY;  Surgeon: Malen Gauze, MD;  Location: [facility] ORS;  Service: Urology;  Laterality: N/A;  pt knows to arrive at 7:00   CYSTOSCOPY WITH FULGERATION N/A 03/22/2022   Procedure: CYSTOSCOPY WITH FULGERATION;  Surgeon: Malen Gauze, MD;  Location: [facility] ORS;  Service: Urology;  Laterality: N/A;   ESOPHAGOGASTRODUODENOSCOPY N/A 08/12/2013   MWU:[id number] peptic stricture with erosive refluxesophagitis - status post Maloney dilation. Hiatal hernia. Abnormalgastric mucosa. Deformity of the pyloric channel suggestive ofprior peptic ulcer disease. Duodenal bulbar diverticulum Statuspost gastric biopsy. h.pylori   LEFT HEART CATHETERIZATION WITH CORONARY/GRAFT ANGIOGRAM N/A 09/26/2012   Procedure: LEFT HEART CATHETERIZATION WITH Isabel Caprice;  Surgeon: Kathleene Hazel, MD;  Location: [facility] CATH LAB;  Service: Cardiovascular;  Laterality: N/A;   MALONEY DILATION N/A 08/12/2013   Procedure: Elease Hashimoto DILATION;  Surgeon: Corbin Ade, MD;  Location: [facility];  Service: Endoscopy;  Laterality: N/A;   POLYPECTOMY  12/18/2019   Procedure: POLYPECTOMY;  Surgeon: Dolores Frame, MD;  Location: [facility];  Service: Gastroenterology;;  ascending colon polyp    PR VEIN BYPASS GRAFT,AORTO-FEM-POP Right 07/19/1999  PR VEIN BYPASS GRAFT,AORTO-FEM-POP Left 05/02/2006   RIGHT HEART CATH AND CORONARY/GRAFT ANGIOGRAPHY N/A 04/11/2020   Procedure: RIGHT HEART CATH AND CORONARY/GRAFT ANGIOGRAPHY;  Surgeon: Laurey Morale, MD;  Location: [facility] INVASIVE CV LAB;  Service: Cardiovascular;  Laterality: N/A;   SAVORY DILATION N/A 08/12/2013   Procedure: SAVORY DILATION;  Surgeon: Corbin Ade, MD;  Location[facility];  Service: Endoscopy;  Laterality: N/A;   TRANSURETHRAL RESECTION OF BLADDER TUMOR N/A 08/31/2021   Procedure: TRANSURETHRAL RESECTION OF BLADDER TUMOR (TURBT);  Surgeon: Malen Gauze, MD;  Location: [facility] OR[facility];  Service: Urology;  Laterality: N/A;   WRIST SURGERY     cyst removal    Home Medications:  Allergies as of 08/15/2022       Reactions   Niacin Itching, Rash   Burning sensation   Contrast Media [iodinated Contrast Media] Nausea And Vomiting   Iodine-131 Nausea And Vomiting   Nsaids Other (See Comments)   Taking Coumadin        Medication List        Accurate as of Aug 15, 2022  3:15 PM. If you have any questions, ask your nurse or doctor.          acetaminophen 325 MG tablet Commonly known as: TYLENOL Take 2 tablets (650 mg total) by mouth every 6 (six) hours as needed for mild pain, fever or headache (or Fever >/= 101).   albuterol 108 (90 Base) MCG/ACT inhaler Commonly known as: VENTOLIN HFA Inhale 2 puffs into the lungs every 6 (six) hours as needed for wheezing or shortness of breath.   aspirin EC 81 MG tablet Take 1 tablet (81 mg total) by mouth daily with breakfast.   atorvastatin 40 MG tablet Commonly known as: LIPITOR Take 1 tablet (40 mg total) by mouth daily.   dutasteride 0.5 MG capsule Commonly known as: AVODART Take 1 capsule (0.5 mg total) by mouth daily.   HYDROcodone-acetaminophen 10-325 MG tablet Commonly known as: NORCO Take 1 tablet by mouth 2 (two) times daily as needed for moderate pain.   ipratropium 0.03 % nasal spray Commonly known as: ATROVENT Place 2 sprays into both nostrils as needed for rhinitis.   isosorbide mononitrate 60 MG 24 hr tablet Commonly known as: IMDUR Take 2 tablets (120 mg total) by mouth every evening.   latanoprost 0.005 % ophthalmic solution Commonly known as: XALATAN Place 1 drop into both eyes at bedtime.   losartan 25 MG tablet Commonly known as:  COZAAR Take 1 tablet (25 mg total) by mouth daily.   metFORMIN 500 MG tablet Commonly known as: GLUCOPHAGE Take 500 mg by mouth 2 (two) times daily.   nitroGLYCERIN 0.4 MG SL tablet Commonly known as: NITROSTAT Place 1 tablet (0.4 mg total) under the tongue every 5 (five) minutes x 3 doses as needed for chest pain.   OXYGEN Inhale 3 L into the lungs at bedtime.   pantoprazole 40 MG tablet Commonly known as: PROTONIX Take 1 tablet (40 mg total) by mouth daily. Take 30-60 min before first meal of the day   predniSONE 50 MG tablet Commonly known as: DELTASONE Take 1 tablet (50 mg total) by mouth PRO. Take 1 tab po 13 hrs prior to procedure Take 1 tab po 7 hours prior to procedure Take 1 tab by mouth 1 hour prior to procedure   spironolactone 25 MG tablet Commonly known as: ALDACTONE Take 1 tablet (25 mg total) by mouth daily.   tamsulosin 0.4 MG Caps  capsule Commonly known as: FLOMAX Take 1 capsule (0.4 mg total) by mouth daily after supper.   torsemide 10 MG tablet Commonly known as: DEMADEX Take 10-20 mg by mouth See admin instructions. Take 10 mg daily, may increase to 20 mg as needed for weight gain   Vitamin D3 50 MCG (2000 UT) capsule Take 2,000 Units by mouth daily.   warfarin 2 MG tablet Commonly known as: COUMADIN Take 2 mg by mouth daily.        Allergies:  Allergies  Allergen Reactions   Niacin Itching and Rash    Burning sensation   Contrast Media [Iodinated Contrast Media] Nausea And Vomiting   Iodine-131 Nausea And Vomiting   Nsaids Other (See Comments)    Taking Coumadin    Family History: Family History  Problem Relation Age of Onset   Deep vein thrombosis Father    Lung cancer Sister    Diabetes Sister    Heart disease Sister        After age 59   Hyperlipidemia Sister    Hypertension Sister    Lung cancer Sister    Breast cancer Sister    Hypertension Mother    Diabetes Sister    Coronary artery disease Other        family hx of    Cancer Brother        "crab cancer"   Heart disease Brother    Heart attack Brother    Heart attack Daughter    Colon cancer Neg Hx    Stroke Neg Hx     Social History:  reports that he quit smoking about 4 years ago. His smoking use included cigarettes. He has a 140.00 pack-year smoking history. He has never used smokeless tobacco. He reports current alcohol use. He reports that he does not use drugs.  ROS: All other review of systems were reviewed and are negative except what is noted above in HPI  Physical Exam: BP 121/67   Pulse 66   Constitutional:  Alert and oriented, No acute distress. HEENT: [place] AT, moist mucus membranes.  Trachea midline, no masses. Cardiovascular: No clubbing, cyanosis, or edema. Respiratory: Normal respiratory effort, no increased work of breathing. GI: Abdomen is soft, nontender, nondistended, no abdominal masses GU: No CVA tenderness.  Lymph: No cervical or inguinal lymphadenopathy. Skin: No rashes, bruises or suspicious lesions. Neurologic: Grossly intact, no focal deficits, moving all 4 extremities. Psychiatric: Normal mood and affect.  Laboratory Data: Lab Results  Component Value Date   WBC 7.7 07/10/2022   HGB 11.7 (L) 07/10/2022   HCT 36.4 (L) 07/10/2022   MCV 89.9 07/10/2022   PLT 176 07/10/2022    Lab Results  Component Value Date   CREATININE 1.11 07/27/2022    No results found for: "PSA"  No results found for: "TESTOSTERONE"  Lab Results  Component Value Date   HGBA1C 6.9 (H) 05/06/2022    Urinalysis    Component Value Date/Time   COLORURINE YELLOW 05/06/2022 1153   APPEARANCEUR Clear 05/16/2022 1532   LABSPEC 1.013 05/06/2022 1153   PHURINE 9.0 (H) 05/06/2022 1153   GLUCOSEU Negative 05/16/2022 1532   HGBUR SMALL (A) 05/06/2022 1153   BILIRUBINUR Negative 05/16/2022 1532   KETONESUR NEGATIVE 05/06/2022 1153   PROTEINUR Negative 05/16/2022 1532   PROTEINUR 30 (A) 05/06/2022 1153   NITRITE Negative 05/16/2022 1532    NITRITE NEGATIVE 05/06/2022 1153   LEUKOCYTESUR Negative 05/16/2022 1532   LEUKOCYTESUR MODERATE (A) 05/06/2022 1153  Lab Results  Component Value Date   LABMICR See below: 05/16/2022   WBCUA 0-5 05/16/2022   LABEPIT None seen 05/16/2022   MUCUS Present 10/27/2021   BACTERIA None seen 05/16/2022    Pertinent Imaging:  No results found for this or any previous visit.  No results found for this or any previous visit.  No results found for this or any previous visit.  No results found for this or any previous visit.  No results found for this or any previous visit.  No valid procedures specified. Results for orders placed during the hospital encounter of 08/18/21  CT HEMATURIA WORKUP  Narrative CLINICAL DATA:  Gross hematuria since early May, history of prostate cancer * Tracking Code: BO *  EXAM: CT ABDOMEN AND PELVIS WITHOUT AND WITH CONTRAST  TECHNIQUE: Multidetector CT imaging of the abdomen and pelvis was performed following the standard protocol before and following the bolus administration of intravenous contrast.  RADIATION DOSE REDUCTION: This exam was performed according to the departmental dose-optimization program which includes automated exposure control, adjustment of the mA and/or kV according to patient size and/or use of iterative reconstruction technique.  CONTRAST:  OMNIPAQUE IOHEXOL 300 MG/ML  SOLN  COMPARISON:  None Available.  FINDINGS: Lower chest: No acute abnormality. Cardiomegaly. Coronary artery calcifications.  Hepatobiliary: No solid liver abnormality is seen. Small, definitively benign cyst or hemangioma of the central liver dome for which no further follow-up or characterization is required (series 15, image 30). No gallstones, gallbladder wall thickening, or biliary dilatation.  Pancreas: Unremarkable. No pancreatic ductal dilatation or surrounding inflammatory changes.  Spleen: Normal in size without significant  abnormality.  Adrenals/Urinary Tract: Adrenal glands are unremarkable. Small simple, benign bilateral renal cortical cysts, as well as intrinsically hyperdense, nonenhancing hemorrhagic or proteinaceous cysts of the midportions of the left and right kidneys (series 2, image 37). No urinary tract filling defect on delayed phase imaging. Bladder is unremarkable.  Stomach/Bowel: Stomach is within normal limits. Appendix appears normal. No evidence of bowel wall thickening, distention, or inflammatory changes. Descending and sigmoid diverticulosis.  Vascular/Lymphatic: Aortic atherosclerosis. 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 No enlarged abdominal or pelvic lymph nodes.  Reproductive: Prostate brachytherapy.  Other: Small, fat containing bilateral inguinal hernias. No ascites.  Musculoskeletal: No acute or significant osseous findings.  IMPRESSION: 1. No evidence of urinary tract mass, calculus, or hydronephrosis. No urinary tract filling defect on delayed phase imaging. 2. Prostate brachytherapy. No evidence of mass or lymphadenopathy in the abdomen or pelvis. 3. Small simple, benign bilateral renal cortical cysts, as well as nonenhancing, benign hemorrhagic or proteinaceous cysts. No further follow-up or characterization is required for these benign renal cysts. 4. 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 Recommend referral to a vascular  specialist. This recommendation follows ACR consensus guidelines: White Paper of the ACR Incidental Findings Committee II on Vascular Findings. J Am Coll Radiol 2013; 10:789-794. 5. Cardiomegaly and coronary artery disease.  Aortic Atherosclerosis (ICD10-I70.0).   Electronically Signed  By: Jearld Lesch M.D. On: 08/19/2021 11:29  No results found for this or any previous visit.   Assessment & Plan:    1. Urinary incontinence, unspecified type -We will schedule for urodynamics - Urinalysis, Routine w reflex microscopic  2. 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 Gross hematuria -resolved   No follow-ups on file.  Wilkie Aye, MD  [facility] [place]

## 2022-08-16 LAB — URINALYSIS, ROUTINE W REFLEX MICROSCOPIC
Bilirubin, UA: NEGATIVE
Glucose, UA: NEGATIVE
Ketones, UA: NEGATIVE
Leukocytes,UA: NEGATIVE
Nitrite, UA: NEGATIVE
Protein,UA: NEGATIVE
RBC, UA: NEGATIVE
Specific Gravity, UA: 1.01 (ref 1.005–1.030)
Urobilinogen, Ur: 0.2 mg/dL (ref 0.2–1.0)
pH, UA: 6 (ref 5.0–7.5)

## 2022-08-17 ENCOUNTER — Telehealth: Payer: Self-pay

## 2022-08-17 NOTE — Telephone Encounter (Signed)
Pt called questioning if he was supposed to take the pills Dr. Arbie Cookey prescribed before his next office visit.   Reviewed pt's chart, returned call for clarification, two identifiers used. Instructed him on how to take the Prednisone and Benadryl for contrast allergy. Confirmed understanding.

## 2022-08-21 ENCOUNTER — Encounter: Payer: Self-pay | Admitting: Urology

## 2022-08-24 ENCOUNTER — Ambulatory Visit (HOSPITAL_COMMUNITY)
Admission: RE | Admit: 2022-08-24 | Discharge: 2022-08-24 | Disposition: A | Discharge status: Home / Self Care | Payer: Medicare HMO | Source: Ambulatory Visit | Attending: Vascular Surgery | Admitting: Vascular Surgery

## 2022-08-24 DIAGNOSIS — I714 Abdominal aortic aneurysm, without rupture, unspecified: Secondary | ICD-10-CM | POA: Diagnosis not present

## 2022-08-24 DIAGNOSIS — I7143 Infrarenal abdominal aortic aneurysm, without rupture: Secondary | ICD-10-CM | POA: Insufficient documentation

## 2022-08-24 MED ORDER — IOHEXOL 350 MG/ML SOLN
80.0000 mL | Freq: Once | INTRAVENOUS | Status: AC | PRN
  Administered 2022-08-24: 80 mL via INTRAVENOUS

## 2022-08-29 ENCOUNTER — Ambulatory Visit: Payer: Medicare HMO | Admitting: Vascular Surgery

## 2022-08-29 ENCOUNTER — Encounter: Payer: Self-pay | Admitting: Vascular Surgery

## 2022-08-29 VITALS — BP 130/70 | HR 64 | Temp 97.3°F | Ht 70.0 in | Wt 208.6 lb

## 2022-08-29 DIAGNOSIS — E1122 Type 2 diabetes mellitus with diabetic chronic kidney disease: Secondary | ICD-10-CM | POA: Diagnosis not present

## 2022-08-29 DIAGNOSIS — E6609 Other obesity due to excess calories: Secondary | ICD-10-CM | POA: Diagnosis not present

## 2022-08-29 DIAGNOSIS — I7143 Infrarenal abdominal aortic aneurysm, without rupture: Secondary | ICD-10-CM | POA: Diagnosis not present

## 2022-08-29 DIAGNOSIS — Z79899 Other long term (current) drug therapy: Secondary | ICD-10-CM | POA: Diagnosis not present

## 2022-08-29 DIAGNOSIS — Z6831 Body mass index (BMI) 31.0-31.9, adult: Secondary | ICD-10-CM | POA: Diagnosis not present

## 2022-08-29 NOTE — Progress Notes (Signed)
Vascular and Vein Specialist of [place]  Patient name: Troy [name] MRN: [id number] DOB: [birth year] Sex: male  REASON FOR VISIT: Follow-up known abdominal aortic aneurysm with recent CT scan  HPI: Troy [name] is a 82 y.o. male here today for follow-up.  His main issue and concern continues to be related to urinary incontinence.  He has a remote history 20 years ago of seed implants for prostate cancer and is felt that this is related as a long-term complication.  He has no symptoms referable to his aneurysm.  He underwent CT angiogram of his abdomen and pelvis on 08/24/2022 and I am reviewing these images with him today.  He does have a remote history of coronary bypass grafting in 2000.  Past Medical History:  Diagnosis Date   AAA (abdominal aortic aneurysm) (HCC)    Followed by Dr. Tawanna Cooler [place]   Arthritis    CAD (coronary artery disease)    a. CABG 2000.   Cancer [facility])    Prostate:  Radiation Tx   Carotid artery disease (HCC)    Chest pain    precordial. mild chronic .[name]... nonischemic   CHF (congestive heart failure) (HCC)    Chronic edema    Coronary artery disease    a. Nuclear, January, 2008, no ischemia b. Cath 08/2012- 1/4 patent grafts, RCA CTO, no flow-limiting disease, medically managed   Diabetes mellitus without complication (HCC)    Dizziness 02/2011   Dyslipidemia    Fall    GERD (gastroesophageal reflux disease)    TAKES TUMS & ROLAIDS AS NEEDED   Heart murmur    Hx of CABG 2000   Hypertension    Myocardial infarction (HCC) 1999   Neck pain 02/2011   Pneumonia    Pulmonary hypertension (HCC)    Renal artery stenosis (HCC)    50-70%   S/P femoropopliteal bypass surgery    Dr. Arbie Cookey   Sinus bradycardia    Asymptomatic    Family History  Problem Relation Age of Onset   Deep vein thrombosis Father    Lung cancer Sister    Diabetes Sister    Heart disease Sister        After age 66   Hyperlipidemia Sister     Hypertension Sister    Lung cancer Sister    Breast cancer Sister    Hypertension Mother    Diabetes Sister    Coronary artery disease Other        family hx of   Cancer Brother        "crab cancer"   Heart disease Brother    Heart attack Brother    Heart attack Daughter    Colon cancer Neg Hx    Stroke Neg Hx     SOCIAL HISTORY: Social History   Tobacco Use   Smoking status: Former    Packs/day: 2.00    Years: 70.00    Additional pack years: 0.00    Total pack years: 140.00    Types: Cigarettes    Quit date: 04/21/2018    Years since quitting: 4.3   Smokeless tobacco: Never  Substance Use Topics   Alcohol use: Yes    Alcohol/week: 0.0 - 2.0 standard drinks of alcohol    Comment: OCCASIONAL    Allergies  Allergen Reactions   Niacin Itching and Rash    Burning sensation   Contrast Media [Iodinated Contrast Media] Nausea And Vomiting    Per patient vocal cords thick with increased  phlegm    Iodine-131 Nausea And Vomiting   Jardiance [Empagliflozin] Other (See Comments)    UTI   Myrbetriq [Mirabegron] Other (See Comments)    UTI   Nsaids Other (See Comments)    Taking Coumadin    Current Outpatient Medications  Medication Sig Dispense Refill   acetaminophen (TYLENOL) 325 MG tablet Take 2 tablets (650 mg total) by mouth every 6 (six) hours as needed for mild pain, fever or headache (or Fever >/= 101). 12 tablet 0   albuterol (VENTOLIN HFA) 108 (90 Base) MCG/ACT inhaler Inhale 2 puffs into the lungs every 6 (six) hours as needed for wheezing or shortness of breath. 1 each 2   aspirin 81 MG EC tablet Take 1 tablet (81 mg total) by mouth daily with breakfast. 30 tablet 3   atorvastatin (LIPITOR) 40 MG tablet Take 1 tablet (40 mg total) by mouth daily. 90 tablet 1   Cholecalciferol (VITAMIN D3) 50 MCG (2000 UT) capsule Take 2,000 Units by mouth daily.     dutasteride (AVODART) 0.5 MG capsule Take 1 capsule (0.5 mg total) by mouth daily. 30 capsule 11    HYDROcodone-acetaminophen (NORCO) 10-325 MG tablet Take 1 tablet by mouth 2 (two) times daily as needed for moderate pain. 30 tablet 0   ipratropium (ATROVENT) 0.03 % nasal spray Place 2 sprays into both nostrils as needed for rhinitis.     isosorbide mononitrate (IMDUR) 60 MG 24 hr tablet Take 2 tablets (120 mg total) by mouth every evening. 180 tablet 3   latanoprost (XALATAN) 0.005 % ophthalmic solution Place 1 drop into both eyes at bedtime.     losartan (COZAAR) 25 MG tablet Take 1 tablet (25 mg total) by mouth daily. 30 tablet 4   metFORMIN (GLUCOPHAGE) 500 MG tablet Take 500 mg by mouth 2 (two) times daily.     nitroGLYCERIN (NITROSTAT) 0.4 MG SL tablet Place 1 tablet (0.4 mg total) under the tongue every 5 (five) minutes x 3 doses as needed for chest pain. 25 tablet 3   OXYGEN Inhale 3 L into the lungs at bedtime.     pantoprazole (PROTONIX) 40 MG tablet Take 1 tablet (40 mg total) by mouth daily. Take 30-60 min before first meal of the day     spironolactone (ALDACTONE) 25 MG tablet Take 1 tablet (25 mg total) by mouth daily. 90 tablet 2   tamsulosin (FLOMAX) 0.4 MG CAPS capsule Take 1 capsule (0.4 mg total) by mouth daily after supper. 90 capsule 3   torsemide (DEMADEX) 10 MG tablet Take 10-20 mg by mouth See admin instructions. Take 10 mg daily, may increase to 20 mg as needed for weight gain     warfarin (COUMADIN) 2 MG tablet Take 2 mg by mouth daily.     No current facility-administered medications for this visit.    REVIEW OF SYSTEMS:  [X]  denotes positive finding, [ ]  denotes negative finding Cardiac  Comments:  Chest pain or chest pressure:    Shortness of breath upon exertion:    Short of breath when lying flat:    Irregular heart rhythm:        Vascular    Pain in calf, thigh, or hip brought on by ambulation:    Pain in feet at night that wakes you up from your sleep:     Blood clot in your veins:    Leg swelling:           PHYSICAL EXAM: Vitals:   08/29/22 1408  BP: 130/70  Pulse: 64  Temp: (!) 97.3 F (36.3 C)  SpO2: 96%  Weight: 208 lb 9.6 oz (94.6 kg)  Height: 5\' 10"  (1.778 m)    GENERAL: The patient is a well-nourished male, in no acute distress. The vital signs are documented above. CARDIOVASCULAR: Abdomen soft with no palpable aneurysm PULMONARY: There is good air exchange  MUSCULOSKELETAL: There are no major deformities or cyanosis. NEUROLOGIC: No focal weakness or paresthesias are detected. SKIN: There are no ulcers or rashes noted. PSYCHIATRIC: The patient has a normal affect.  DATA:  CT scan reveals 2 components of his aneurysm.  He has a standard fusiform component in the mid infrarenal aorta with a maximal diameter of 4.8 cm.  He has a saccular component above the bifurcation.  This extends to the left and the maximal diameter of his true aorta and the saccular component is 7.8 cm.  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  He is a candidate for stent graft repair.  I described this procedure as a probable percutaneous bilateral access to the common femoral artery and expected 1 night hospitalization.  Reviewed the films with Dr. Clotilde Dieter in our practice who agrees with the plan for EVAR.  We will have him see Dr. Marca Ancona for preop clearance prior to proceeding    Larina Earthly, MD [facility] Specialists [facility] [phone]  Note: Portions of this report may have been transcribed using voice recognition software.  Every effort has been made to ensure accuracy; however, inadvertent computerized transcription errors may still be present.

## 2022-08-31 ENCOUNTER — Telehealth: Payer: Self-pay

## 2022-08-31 ENCOUNTER — Ambulatory Visit (INDEPENDENT_AMBULATORY_CARE_PROVIDER_SITE_OTHER): Payer: No Typology Code available for payment source | Admitting: Urology

## 2022-08-31 ENCOUNTER — Encounter: Payer: Self-pay | Admitting: Urology

## 2022-08-31 ENCOUNTER — Other Ambulatory Visit (HOSPITAL_COMMUNITY): Payer: Self-pay

## 2022-08-31 VITALS — BP 125/72 | HR 87

## 2022-08-31 DIAGNOSIS — N50819 Testicular pain, unspecified: Secondary | ICD-10-CM | POA: Diagnosis not present

## 2022-08-31 DIAGNOSIS — I272 Pulmonary hypertension, unspecified: Secondary | ICD-10-CM

## 2022-08-31 DIAGNOSIS — R310 Gross hematuria: Secondary | ICD-10-CM | POA: Diagnosis not present

## 2022-08-31 DIAGNOSIS — R32 Unspecified urinary incontinence: Secondary | ICD-10-CM

## 2022-08-31 LAB — URINALYSIS, ROUTINE W REFLEX MICROSCOPIC
Bilirubin, UA: NEGATIVE
Glucose, UA: NEGATIVE
Ketones, UA: NEGATIVE
Nitrite, UA: NEGATIVE
Specific Gravity, UA: 1.01 (ref 1.005–1.030)
Urobilinogen, Ur: 0.2 mg/dL (ref 0.2–1.0)
pH, UA: 5.5 (ref 5.0–7.5)

## 2022-08-31 LAB — MICROSCOPIC EXAMINATION: WBC, UA: >30 /hpf — AB (ref 0–5)

## 2022-08-31 MED ORDER — SPIRONOLACTONE 25 MG PO TABS
25.0000 mg | ORAL_TABLET | Freq: Every day | ORAL | 3 refills | Status: DC
Start: 2022-08-31 — End: 2022-10-12

## 2022-08-31 MED ORDER — DOXYCYCLINE HYCLATE 100 MG PO CAPS
100.0000 mg | ORAL_CAPSULE | Freq: Two times a day (BID) | ORAL | 0 refills | Status: DC
Start: 2022-08-31 — End: 2022-09-27

## 2022-08-31 MED ORDER — DOXYCYCLINE HYCLATE 100 MG PO CAPS: 100.0000 mg | ORAL_CAPSULE | Freq: Two times a day (BID) | ORAL | 0 refills | Status: DC

## 2022-08-31 NOTE — Patient Instructions (Signed)

## 2022-08-31 NOTE — Progress Notes (Signed)
08/31/2022 9:58 AM   Troy [name] [birth year] [id number]  Referring provider: Assunta Found, MD [address] [place],  [place] [address]  Gross hematuria and left testis pain    HPI: Troy [name] is a 81yo here for followup for gross hematuria and new right testicular pain. He had gross hematuria after a long car ride which resolved after 2 days. Starting Tuesday he developed right testis pain which has worsened.   PMH: Past Medical History:  Diagnosis Date   AAA (abdominal aortic aneurysm) (HCC)    Followed by Dr. Tawanna Cooler Early   Arthritis    CAD (coronary artery disease)    a. CABG 2000.   Cancer [facility])    Prostate:  Radiation Tx   Carotid artery disease (HCC)    Chest pain    precordial. mild chronic .[name]... nonischemic   CHF (congestive heart failure) (HCC)    Chronic edema    Coronary artery disease    a. Nuclear, January, 2008, no ischemia b. Cath 08/2012- 1/4 patent grafts, RCA CTO, no flow-limiting disease, medically managed   Diabetes mellitus without complication (HCC)    Dizziness 02/2011   Dyslipidemia    Fall    GERD (gastroesophageal reflux disease)    TAKES TUMS & ROLAIDS AS NEEDED   Heart murmur    Hx of CABG 2000   Hypertension    Myocardial infarction [facility]) 1999   Neck pain 02/2011   Pneumonia    Pulmonary hypertension (HCC)    Renal artery stenosis (HCC)    50-70%   S/P femoropopliteal bypass surgery    Dr. Arbie Cookey   Sinus bradycardia    Asymptomatic    Surgical History: Past Surgical History:  Procedure Laterality Date   CARDIAC CATHETERIZATION  09/26/2012   1/4 patent bypass (occluded SVG-PDA, SVG-OM, LIMA-LAD), SVG-diagonal patent and fills the diagonal and LAD, distal RCA occlusion with left to right collateralization, patent circumflex, LAD with no flow-limiting disease and antegrade flow competitively from SVG-diagonal; EF 60-65%   COLONOSCOPY  11/30/2009   AOZ:HYQMVHQIONGEXB. next TCS 11/2019   COLONOSCOPY WITH PROPOFOL N/A 12/18/2019    Procedure: COLONOSCOPY WITH PROPOFOL;  Surgeon: Dolores Frame, MD;  [facility];  Service: Gastroenterology;  Laterality: N/A;  1030   CORONARY ARTERY BYPASS GRAFT  2000   CYSTOSCOPY N/A 08/31/2021   Procedure: CYSTOSCOPY;  Surgeon: Malen Gauze, MD;  Location: [facility] ORS;  Service: Urology;  Laterality: N/A;  pt knows to arrive at 7:00   CYSTOSCOPY WITH FULGERATION N/A 03/22/2022   Procedure: CYSTOSCOPY WITH FULGERATION;  Surgeon: Malen Gauze, MD;  Location: [facility];  Service: Urology;  Laterality: N/A;   ESOPHAGOGASTRODUODENOSCOPY N/A 08/12/2013   MWU:[id number] peptic stricture with erosive refluxesophagitis - status post Maloney dilation. Hiatal hernia. Abnormalgastric mucosa. Deformity of the pyloric channel suggestive ofprior peptic ulcer disease. Duodenal bulbar diverticulum Statuspost gastric biopsy. h.pylori   LEFT HEART CATHETERIZATION WITH CORONARY/GRAFT ANGIOGRAM N/A 09/26/2012   Procedure: LEFT HEART CATHETERIZATION WITH Isabel Caprice;  Surgeon: Kathleene Hazel, MD;  Location: [facility] CATH LAB;  Service: Cardiovascular;  Laterality: N/A;   MALONEY DILATION N/A 08/12/2013   Procedure: Elease Hashimoto DILATION;  Surgeon: Corbin Ade, MD;  Location: [facility];  Service: Endoscopy;  Laterality: N/A;   POLYPECTOMY  12/18/2019   Procedure: POLYPECTOMY;  Surgeon: Dolores Frame, MD;  [facility]: [facility];  Service: Gastroenterology;;  ascending colon polyp    PR VEIN BYPASS GRAFT,AORTO-FEM-POP Right 07/19/1999   PR VEIN BYPASS  GRAFT,AORTO-FEM-POP Left 05/02/2006   RIGHT HEART CATH AND CORONARY/GRAFT ANGIOGRAPHY N/A 04/11/2020   Procedure: RIGHT HEART CATH AND CORONARY/GRAFT ANGIOGRAPHY;  Surgeon: Laurey Morale, MD;  Location: [facility] INVASIVE CV LAB;  Service: Cardiovascular;  Laterality: N/A;   SAVORY DILATION N/A 08/12/2013   Procedure: SAVORY DILATION;  Surgeon: Corbin Ade, MD;  [facility];  Service: Endoscopy;   Laterality: N/A;   TRANSURETHRAL RESECTION OF BLADDER TUMOR N/A 08/31/2021   Procedure: TRANSURETHRAL RESECTION OF BLADDER TUMOR (TURBT);  Surgeon: Malen Gauze, MD;  Location: [facility];  Service: Urology;  Laterality: N/A;   WRIST SURGERY     cyst removal    Home Medications:  Allergies as of 08/31/2022       Reactions   Niacin Itching, Rash   Burning sensation   Contrast Media [iodinated Contrast Media] Nausea And Vomiting   Per patient vocal cords thick with increased phlegm    Iodine-131 Nausea And Vomiting   Jardiance [empagliflozin] Other (See Comments)   UTI   Myrbetriq [mirabegron] Other (See Comments)   UTI   Nsaids Other (See Comments)   Taking Coumadin        Medication List        Accurate as of Aug 31, 2022  9:58 AM. If you have any questions, ask your nurse or doctor.          acetaminophen 325 MG tablet Commonly known as: TYLENOL Take 2 tablets (650 mg total) by mouth every 6 (six) hours as needed for mild pain, fever or headache (or Fever >/= 101).   albuterol 108 (90 Base) MCG/ACT inhaler Commonly known as: VENTOLIN HFA Inhale 2 puffs into the lungs every 6 (six) hours as needed for wheezing or shortness of breath.   aspirin EC 81 MG tablet Take 1 tablet (81 mg total) by mouth daily with breakfast.   atorvastatin 40 MG tablet Commonly known as: LIPITOR Take 1 tablet (40 mg total) by mouth daily.   dutasteride 0.5 MG capsule Commonly known as: AVODART Take 1 capsule (0.5 mg total) by mouth daily.   HYDROcodone-acetaminophen 10-325 MG tablet Commonly known as: NORCO Take 1 tablet by mouth 2 (two) times daily as needed for moderate pain.   ipratropium 0.03 % nasal spray Commonly known as: ATROVENT Place 2 sprays into both nostrils as needed for rhinitis.   isosorbide mononitrate 60 MG 24 hr tablet Commonly known as: IMDUR Take 2 tablets (120 mg total) by mouth every evening.   latanoprost 0.005 % ophthalmic solution Commonly known  as: XALATAN Place 1 drop into both eyes at bedtime.   losartan 25 MG tablet Commonly known as: COZAAR Take 1 tablet (25 mg total) by mouth daily.   metFORMIN 500 MG tablet Commonly known as: GLUCOPHAGE Take 500 mg by mouth 2 (two) times daily.   nitroGLYCERIN 0.4 MG SL tablet Commonly known as: NITROSTAT Place 1 tablet (0.4 mg total) under the tongue every 5 (five) minutes x 3 doses as needed for chest pain.   OXYGEN Inhale 3 L into the lungs at bedtime.   pantoprazole 40 MG tablet Commonly known as: PROTONIX Take 1 tablet (40 mg total) by mouth daily. Take 30-60 min before first meal of the day   spironolactone 25 MG tablet Commonly known as: ALDACTONE Take 1 tablet (25 mg total) by mouth daily.   tamsulosin 0.4 MG Caps capsule Commonly known as: FLOMAX Take 1 capsule (0.4 mg total) by mouth daily after supper.   torsemide  10 MG tablet Commonly known as: DEMADEX Take 10-20 mg by mouth See admin instructions. Take 10 mg daily, may increase to 20 mg as needed for weight gain   Vitamin D3 50 MCG (2000 UT) capsule Take 2,000 Units by mouth daily.   warfarin 2 MG tablet Commonly known as: COUMADIN Take 2 mg by mouth daily.        Allergies:  Allergies  Allergen Reactions   Niacin Itching and Rash    Burning sensation   Contrast Media [Iodinated Contrast Media] Nausea And Vomiting    Per patient vocal cords thick with increased phlegm    Iodine-131 Nausea And Vomiting   Jardiance [Empagliflozin] Other (See Comments)    UTI   Myrbetriq [Mirabegron] Other (See Comments)    UTI   Nsaids Other (See Comments)    Taking Coumadin    Family History: Family History  Problem Relation Age of Onset   Deep vein thrombosis Father    Lung cancer Sister    Diabetes Sister    Heart disease Sister        After age 4   Hyperlipidemia Sister    Hypertension Sister    Lung cancer Sister    Breast cancer Sister    Hypertension Mother    Diabetes Sister    Coronary  artery disease Other        family hx of   Cancer Brother        "crab cancer"   Heart disease Brother    Heart attack Brother    Heart attack Daughter    Colon cancer Neg Hx    Stroke Neg Hx     Social History:  reports that he quit smoking about 4 years ago. His smoking use included cigarettes. He has a 140.00 pack-year smoking history. He has never used smokeless tobacco. He reports current alcohol use. He reports that he does not use drugs.  ROS: All other review of systems were reviewed and are negative except what is noted above in HPI  Physical Exam: BP 125/72   Pulse 87   Constitutional:  Alert and oriented, No acute distress. HEENT: [place] AT, moist mucus membranes.  Trachea midline, no masses. Cardiovascular: No clubbing, cyanosis, or edema. Respiratory: Normal respiratory effort, no increased work of breathing. GI: Abdomen is soft, nontender, nondistended, no abdominal masses GU: No CVA tenderness.  Lymph: No cervical or inguinal lymphadenopathy. Skin: No rashes, bruises or suspicious lesions. Neurologic: Grossly intact, no focal deficits, moving all 4 extremities. Psychiatric: Normal mood and affect.  Laboratory Data: Lab Results  Component Value Date   WBC 7.7 07/10/2022   HGB 11.7 (L) 07/10/2022   HCT 36.4 (L) 07/10/2022   MCV 89.9 07/10/2022   PLT 176 07/10/2022    Lab Results  Component Value Date   CREATININE 1.11 07/27/2022    No results [name] for: "PSA"  No results [name] for: "TESTOSTERONE"  Lab Results  Component Value Date   HGBA1C 6.9 (H) 05/06/2022    Urinalysis    Component Value Date/Time   COLORURINE YELLOW 05/06/2022 1153   APPEARANCEUR Clear 08/15/2022 1515   LABSPEC 1.013 05/06/2022 1153   PHURINE 9.0 (H) 05/06/2022 1153   GLUCOSEU Negative 08/15/2022 1515   HGBUR SMALL (A) 05/06/2022 1153   BILIRUBINUR Negative 08/15/2022 1515   KETONESUR NEGATIVE 05/06/2022 1153   PROTEINUR Negative 08/15/2022 1515   PROTEINUR 30 (A)  05/06/2022 1153   NITRITE Negative 08/15/2022 1515   NITRITE NEGATIVE 05/06/2022 1153  LEUKOCYTESUR Negative 08/15/2022 1515   LEUKOCYTESUR MODERATE (A) 05/06/2022 1153    Lab Results  Component Value Date   LABMICR Comment 08/15/2022   WBCUA 0-5 05/16/2022   LABEPIT None seen 05/16/2022   MUCUS Present 10/27/2021   BACTERIA None seen 05/16/2022    Pertinent Imaging:  No results [name] for this or any previous visit.  No results [name] for this or any previous visit.  No results [name] for this or any previous visit.  No results [name] for this or any previous visit.  No results [name] for this or any previous visit.  No valid procedures specified. Results for orders placed during the hospital encounter of 08/18/21  CT HEMATURIA WORKUP  Narrative CLINICAL DATA:  Gross hematuria since early May, history of prostate cancer * Tracking Code: BO *  EXAM: CT ABDOMEN AND PELVIS WITHOUT AND WITH CONTRAST  TECHNIQUE: Multidetector CT imaging of the abdomen and pelvis was performed following the standard protocol before and following the bolus administration of intravenous contrast.  RADIATION DOSE REDUCTION: This exam was performed according to the departmental dose-optimization program which includes automated exposure control, adjustment of the mA and/or kV according to patient size and/or use of iterative reconstruction technique.  CONTRAST:  OMNIPAQUE IOHEXOL 300 MG/ML  SOLN  COMPARISON:  None Available.  FINDINGS: Lower chest: No acute abnormality. Cardiomegaly. Coronary artery calcifications.  Hepatobiliary: No solid liver abnormality is seen. Small, definitively benign cyst or hemangioma of the central liver dome for which no further follow-up or characterization is required (series 15, image 30). No gallstones, gallbladder wall thickening, or biliary dilatation.  Pancreas: Unremarkable. No pancreatic ductal dilatation or surrounding inflammatory  changes.  Spleen: Normal in size without significant abnormality.  Adrenals/Urinary Tract: Adrenal glands are unremarkable. Small simple, benign bilateral renal cortical cysts, as well as intrinsically hyperdense, nonenhancing hemorrhagic or proteinaceous cysts of the midportions of the left and right kidneys (series 2, image 37). No urinary tract filling defect on delayed phase imaging. Bladder is unremarkable.  Stomach/Bowel: Stomach is within normal limits. Appendix appears normal. No evidence of bowel wall thickening, distention, or inflammatory changes. Descending and sigmoid diverticulosis.  Vascular/Lymphatic: Aortic atherosclerosis. 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 No enlarged abdominal or pelvic lymph nodes.  Reproductive: Prostate brachytherapy.  Other: Small, fat containing bilateral inguinal hernias. No ascites.  Musculoskeletal: No acute or significant osseous findings.  IMPRESSION: 1. No evidence of urinary tract mass, calculus, or hydronephrosis. No urinary tract filling defect on delayed phase imaging. 2. Prostate brachytherapy. No evidence of mass or lymphadenopathy in the abdomen or pelvis. 3. Small simple, benign bilateral renal cortical cysts, as well as nonenhancing, benign hemorrhagic or proteinaceous cysts. No further follow-up or characterization is required for these benign renal cysts. 4. 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 Recommend referral to a vascular specialist. This recommendation follows ACR consensus guidelines: White Paper of the ACR Incidental Findings Committee II on Vascular Findings. J Am Coll Radiol 2013; 10:789-794. 5.  Cardiomegaly and coronary artery disease.  Aortic Atherosclerosis (ICD10-I70.0).   Electronically Signed By: Jearld Lesch M.D. On: 08/19/2021 11:29  No results [name] for this or any previous visit.   Assessment & Plan:    1. Pain in testicle, unspecified laterality Urine for culture -doxycyline 100mg  BID for 14 days -Patient to call Dr. Beatrice Lecher office to adjust coumadin - Urinalysis, Routine w reflex microscopic  2. Gross hematuria -continue observation   No follow-ups on file.  Wilkie Aye, MD  [facility] [place]

## 2022-08-31 NOTE — Telephone Encounter (Signed)
Patient called advising his right testicle in swollen and painful and wanted to know what was recommended. Our schedule Is full today for both providers.

## 2022-09-03 ENCOUNTER — Telehealth: Payer: Self-pay | Admitting: *Deleted

## 2022-09-03 ENCOUNTER — Encounter (HOSPITAL_COMMUNITY): Payer: Self-pay

## 2022-09-03 ENCOUNTER — Ambulatory Visit (HOSPITAL_COMMUNITY)
Admission: RE | Admit: 2022-09-03 | Discharge: 2022-09-03 | Disposition: A | Discharge status: Home / Self Care | Payer: Medicare HMO | Source: Ambulatory Visit | Attending: Family Medicine | Admitting: Family Medicine

## 2022-09-03 VITALS — BP 114/62 | HR 68 | Wt 207.6 lb

## 2022-09-03 DIAGNOSIS — I714 Abdominal aortic aneurysm, without rupture, unspecified: Secondary | ICD-10-CM

## 2022-09-03 DIAGNOSIS — Z01818 Encounter for other preprocedural examination: Secondary | ICD-10-CM

## 2022-09-03 DIAGNOSIS — Z9981 Dependence on supplemental oxygen: Secondary | ICD-10-CM | POA: Insufficient documentation

## 2022-09-03 DIAGNOSIS — I5032 Chronic diastolic (congestive) heart failure: Secondary | ICD-10-CM

## 2022-09-03 DIAGNOSIS — R32 Unspecified urinary incontinence: Secondary | ICD-10-CM

## 2022-09-03 DIAGNOSIS — Z7901 Long term (current) use of anticoagulants: Secondary | ICD-10-CM | POA: Insufficient documentation

## 2022-09-03 DIAGNOSIS — I2510 Atherosclerotic heart disease of native coronary artery without angina pectoris: Secondary | ICD-10-CM | POA: Diagnosis not present

## 2022-09-03 DIAGNOSIS — I6529 Occlusion and stenosis of unspecified carotid artery: Secondary | ICD-10-CM | POA: Diagnosis not present

## 2022-09-03 DIAGNOSIS — R001 Bradycardia, unspecified: Secondary | ICD-10-CM

## 2022-09-03 DIAGNOSIS — I4821 Permanent atrial fibrillation: Secondary | ICD-10-CM | POA: Diagnosis not present

## 2022-09-03 DIAGNOSIS — J9611 Chronic respiratory failure with hypoxia: Secondary | ICD-10-CM

## 2022-09-03 DIAGNOSIS — I739 Peripheral vascular disease, unspecified: Secondary | ICD-10-CM

## 2022-09-03 DIAGNOSIS — I110 Hypertensive heart disease with heart failure: Secondary | ICD-10-CM | POA: Insufficient documentation

## 2022-09-03 DIAGNOSIS — Z8679 Personal history of other diseases of the circulatory system: Secondary | ICD-10-CM | POA: Insufficient documentation

## 2022-09-03 DIAGNOSIS — I272 Pulmonary hypertension, unspecified: Secondary | ICD-10-CM | POA: Diagnosis not present

## 2022-09-03 DIAGNOSIS — Z87891 Personal history of nicotine dependence: Secondary | ICD-10-CM | POA: Diagnosis not present

## 2022-09-03 DIAGNOSIS — R590 Localized enlarged lymph nodes: Secondary | ICD-10-CM | POA: Insufficient documentation

## 2022-09-03 DIAGNOSIS — I25810 Atherosclerosis of coronary artery bypass graft(s) without angina pectoris: Secondary | ICD-10-CM | POA: Insufficient documentation

## 2022-09-03 DIAGNOSIS — Z79899 Other long term (current) drug therapy: Secondary | ICD-10-CM | POA: Diagnosis not present

## 2022-09-03 DIAGNOSIS — J3801 Paralysis of vocal cords and larynx, unilateral: Secondary | ICD-10-CM | POA: Diagnosis not present

## 2022-09-03 LAB — BASIC METABOLIC PANEL
Anion gap: 11 (ref 5–15)
BUN: 23 mg/dL (ref 8–23)
CO2: 26 mmol/L (ref 22–32)
Calcium: 8.8 mg/dL — ABNORMAL LOW (ref 8.9–10.3)
Chloride: 98 mmol/L (ref 98–111)
Creatinine, Ser: 1.32 mg/dL — ABNORMAL HIGH (ref 0.61–1.24)
GFR, Estimated: 55 mL/min — ABNORMAL LOW (ref >60)
Glucose, Bld: 122 mg/dL — ABNORMAL HIGH (ref 70–99)
Potassium: 3.9 mmol/L (ref 3.5–5.1)
Sodium: 135 mmol/L (ref 135–145)

## 2022-09-03 LAB — PROTIME-INR
INR: 2.4 — ABNORMAL HIGH (ref 0.8–1.2)
Prothrombin Time: 26.5 seconds — ABNORMAL HIGH (ref 11.4–15.2)

## 2022-09-03 NOTE — Telephone Encounter (Signed)
ATT: SEE NOTES FROM JESSICA MILFORD, FNP

## 2022-09-03 NOTE — Patient Instructions (Addendum)
Thank you for coming in today  If you had labs drawn today, any labs that are abnormal the clinic will call you No news is good news  PLEASE CALL WHEN YOU HAVE A SURGERY DATE SO WE CAN ARRANGE POST HOSPITAL FOLLOW UP  Medications: No changes  Follow up appointments:  Your physician recommends that you schedule a follow-up appointment in:  3-4 months With [name]     Do the following things EVERYDAY: Weigh yourself in the morning before breakfast. Write it down and keep it in a log. Take your medicines as prescribed Eat low salt foods--Limit salt (sodium) to 2000 mg per day.  Stay as active as you can everyday Limit all fluids for the day to less than 2 liters   At the [facility], you and your health needs are our priority. As part of our continuing mission to provide you with exceptional heart care, we have created designated Provider Care Teams. These Care Teams include your primary Cardiologist (physician) and Advanced Practice Providers (APPs- Physician Assistants and Nurse Practitioners) who all work together to provide you with the care you need, when you need it.   You may see any of the following providers on your designated Care Team at your next follow up: Dr Arvilla Meres Dr Marca Ancona Dr. Marcos Eke, NP Robbie Lis, [place] [facility] [place], [place] Brynda Peon, NP Karle Plumber, PharmD   Please be sure to bring in all your medications bottles to every appointment.    Thank you for choosing [facility]  If you have any questions or concerns before your next appointment please send [place] a message through [place] or call our office at [phone].    TO LEAVE A MESSAGE FOR THE NURSE SELECT OPTION 2, PLEASE LEAVE A MESSAGE INCLUDING: YOUR NAME DATE OF BIRTH CALL BACK NUMBER REASON FOR CALL**this is important as we prioritize the call backs  YOU WILL RECEIVE A CALL  BACK THE SAME DAY AS LONG AS YOU CALL BEFORE 4:00 PM

## 2022-09-03 NOTE — Telephone Encounter (Signed)
   Pre-operative Risk Assessment    Patient Name: Troy [name]  DOB: 01/16/1941 MRN: [id number]      Request for Surgical Clearance    Procedure:   EVAR  Date of Surgery:  Clearance TBD                                 Surgeon:  DR. Chestine Spore Surgeon's Group or Practice Name:  [facility] Phone number:  [phone] Fax number:  [phone]   Type of Clearance Requested:   - Medical  - Pharmacy:  Hold Aspirin and Warfarin (Coumadin)     Type of Anesthesia:  General    Additional requests/questions:    Elpidio Anis   09/03/2022, 11:55 AM

## 2022-09-03 NOTE — Progress Notes (Signed)
PCP: Assunta Found, MD HF Cardiology: [name]  82 y.o. with history of permanent atrial fibrillation, CAD s/p CABG 2000, PAD, chronic diastolic CHF was referred to CHF clinic by Ronie Spies, PA, for evaluation of CHF and pulmonary hypertension.  Patient had CABG in 2000, last cath was in 2014 showing occluded SVG-OM, occluded SVG-PDA, occluded RCA, and atretic LIMA.  SVG-D was patent and native LAD was patent.  Patient had bilateral fem-pop bypasses, peripheral dopplers in 7/21 showed that the right fem-pop was occluded.  He is in permanent atrial fibrillation on warfarin. Echo in 10/21 showed normal LV systolic function with moderately dilated/mildly dysfunctional RV and PASP 75 mmHg.  In 12/21, he was admitted with PNA and treated with antibiotics.  CTA chest showed no PE, RML PNA, and bilateral hilar + mediastinal lymphadenopathy.  He was sent home from this admission on home oxygen.   In 1/22, RHC/LHC was done.  This showed patent LIMA-LAD and SVG-D, occluded SVG-OM and occluded SVG-RCA, 95% calcified proximal RCA stenosis, totally occluded PLV with collaterals (known from prior).  There was moderate pulmonary hypertension with optimized left and right heart filling pressures.  I reviewed the cath with interventional cardiology, would require atherectomy to treat the RCA.  We decided to manage medically initially.   He stopped Jardiance due to penile yeast infection.   Echo 4/23 showed EF 55-60%, mild LVH, RV mildly reduced.   Admitted 10/23 with hematuria and inability to urinate. Urology consulted, foley placed and discharged home. Seen in the ED a week later with hematuria. Urology consulted and arranged traction on foley to help assist with tamponade.   Echo 3/24 showed EF 60-65% with mildly D-shaped septum, mild RV dilation with mildly decreased systolic function, IVC dilated, PASP 45 mmHg.   Seen in ED 07/10/22 with CP. HR 30 when EMS arrived, asymptomatic. He was given IV atropine and 200  cc IVF bolus. HsTroponins x 3 negative, HR 50's. Live Zio 2 week placed 4/24.   Follow up 4/24, NYHA II volume stable.  Today he returns for HF follow up with his daughter. Overall feeling fine. He is not SOB walking on flat ground with his walker. He can walk up steps without dyspnea, if he takes his time. He is being treated for a testicular infection. Denies palpitations, abnormal bleeding, CP, dizziness, edema, or PND/Orthopnea. Appetite ok. No fever or chills. Weight at home 203-206 pounds. Taking all medications. Wears 3L oxygen at night.   ECG (personally reviewed): atrial fibrillation w/ PVC, 65 bpm  Labs (12/21): K 3.8, creatinine 0.83, hgb 9.5 Labs (1/22): K 3.8, creatinine 1.22, LDL 49 Labs (2/22): K 3.6, creatinine 0.86 Labs (10/22): K 3.7, creatinine 0.75, BNP 153 Labs (1/23): LDL 62, HDL 37 Labs (9/23): K 3.6, creatinine 0.9, hgb 11.4 Labs (10/23): K 3.5, creatinine 0.81 Labs (2/24): K 3.8, creatinine 1.02 Labs (3/24): K 3.6, creatinine 1.07  PMH: 1. Atrial fibrillation: Permanent.  2. CAD: CABG 2000.  - LHC (2014) with totally occluded RCA, totally occluded SVG-RCA, totally occluded SVG-OM, atretic LIMA, patent SVG-D.   - LHC (1/22): patent LIMA-LAD and SVG-D, occluded SVG-OM and occluded SVG-RCA, 60-70% calcified ostial RCA stenosis and 95% calcified proximal RCA stenosis, totally occluded PLV with collaterals (known from prior). 3. HTN 4. PAD: s/p bilateral fem-pop bypasses.   - Peripheral arterial dopplers (7/21): Occluded right fem-pop, patent left fem-pop.  5. Type 2 diabetes 6. Prostate cancer: Treated with radiation. 7. PVCs 8. History of renal artery stenosis.  9.  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  10. Chronic diastolic CHF: Echo (10/21) with EF 60-65%, mildly decreased RV systolic function with moderate RV enlargement, severe biatrial enlargement, PASP 75 mmHg, dilated IVC.  V/Q scan 7/22 with no chronic PE.  - RHC (1/22): mean RA 6, PA  65/18 mean 35, mean PCWP 14, CI 2.71, PVR 3.7 WU.  - Echo (4/23): EF 55-60%, mild LVH, RV mildly reduced, severely elevated pulmonary artery systolic pressure 11. 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  - Abdominal US (8/22): 4.8 cm AAA - CT abd/pelvis (5/23): 4.8 cm AAA - CT abd/pelvis (2/24): 5 cm AAA 12. Carotid stenosis: Carotid dopplers (8/22) with 40-59% LICA stenosis.  - Carotid dopplers (9/23): 40-59% LICA stenosis.  13. BPH  SH: Lives in [place] with wife.  Retired.  Remote smoker (>20 years ago). No ETOH.   Family History  Problem Relation Age of Onset   Deep vein thrombosis Father    Lung cancer Sister    Diabetes Sister    Heart disease Sister        After age 41   Hyperlipidemia Sister    Hypertension Sister    Lung cancer Sister    Breast cancer Sister    Hypertension Mother    Diabetes Sister    Coronary artery disease Other        family hx of   Cancer Brother        "crab cancer"   Heart disease Brother    Heart attack Brother    Heart attack Daughter    Colon cancer Neg Hx    Stroke Neg Hx    ROS: All systems reviewed and negative except as per HPI.   Current Outpatient Medications  Medication Sig Dispense Refill   acetaminophen (TYLENOL) 325 MG tablet Take 2 tablets (650 mg total) by mouth every 6 (six) hours as needed for mild pain, fever or headache (or Fever >/= 101). 12 tablet 0   albuterol (VENTOLIN HFA) 108 (90 Base) MCG/ACT inhaler Inhale 2 puffs into the lungs every 6 (six) hours as needed for wheezing or shortness of breath. 1 each 2   aspirin 81 MG EC tablet Take 1 tablet (81 mg total) by mouth daily with breakfast. 30 tablet 3   atorvastatin (LIPITOR) 40 MG tablet Take 1 tablet (40 mg total) by mouth daily. 90 tablet 1   Cholecalciferol (VITAMIN D3) 50 MCG (2000 UT) capsule Take 2,000 Units by mouth daily.     doxycycline (VIBRAMYCIN) 100 MG capsule Take 1 capsule (100 mg total) by mouth every 12 (twelve) hours. 28 capsule 0   dutasteride (AVODART)  0.5 MG capsule Take 1 capsule (0.5 mg total) by mouth daily. 30 capsule 11   HYDROcodone-acetaminophen (NORCO) 10-325 MG tablet Take 1 tablet by mouth 2 (two) times daily as needed for moderate pain. 30 tablet 0   ipratropium (ATROVENT) 0.03 % nasal spray Place 2 sprays into both nostrils as needed for rhinitis.     isosorbide mononitrate (IMDUR) 60 MG 24 hr tablet Take 2 tablets (120 mg total) by mouth every evening. 180 tablet 3   latanoprost (XALATAN) 0.005 % ophthalmic solution Place 1 drop into both eyes at bedtime.     losartan (COZAAR) 25 MG tablet Take 1 tablet (25 mg total) by mouth daily. 30 tablet 4   metFORMIN (GLUCOPHAGE) 500 MG tablet Take 500 mg by mouth 2 (two) times daily.     nitroGLYCERIN (NITROSTAT) 0.4 MG SL tablet  Place 1 tablet (0.4 mg total) under the tongue every 5 (five) minutes x 3 doses as needed for chest pain. 25 tablet 3   OXYGEN Inhale 3 L into the lungs at bedtime.     pantoprazole (PROTONIX) 40 MG tablet Take 1 tablet (40 mg total) by mouth daily. Take 30-60 min before first meal of the day     spironolactone (ALDACTONE) 25 MG tablet Take 1 tablet (25 mg total) by mouth daily. 90 tablet 3   tamsulosin (FLOMAX) 0.4 MG CAPS capsule Take 1 capsule (0.4 mg total) by mouth daily after supper. 90 capsule 3   torsemide (DEMADEX) 10 MG tablet Take 10-20 mg by mouth See admin instructions. Take 10 mg daily, may increase to 20 mg as needed for weight gain     warfarin (COUMADIN) 2 MG tablet Take 2 mg by mouth daily.     No current facility-administered medications for this encounter.   Wt Readings from Last 3 Encounters:  09/03/22 94.2 kg (207 lb 9.6 oz)  08/29/22 94.6 kg (208 lb 9.6 oz)  07/27/22 95.7 kg (211 lb)   BP 114/62   Pulse 68   Wt 94.2 kg (207 lb 9.6 oz)   SpO2 97%   BMI 29.79 kg/m  Physical Exam General:  NAD. No resp difficulty, walked into clinic with rolling walker, elderly HEENT: Normal Neck: Supple. No JVD. Carotids 2+ bilat; no bruits. No  lymphadenopathy or thryomegaly appreciated. Cor: PMI nondisplaced. Irregular rate & rhythm. No rubs, gallops or murmurs. Lungs: Diminished throughout Abdomen: Soft, nontender, nondistended. No hepatosplenomegaly. No bruits or masses. Good bowel sounds. Extremities: No cyanosis, clubbing, rash, edema Neuro: Alert & oriented x 3, cranial nerves grossly intact. Moves all 4 extremities w/o difficulty. Affect pleasant.  Assessment/Plan: 1. Chronic diastolic CHF: Echo in 10/21 with EF 60-65%, mildly decreased RV systolic function with moderate RV enlargement, severe biatrial enlargement, PASP 75 mmHg, dilated IVC. After increasing diuretics, RHC in 1/22 showed normal filling pressures with moderate pulmonary hypertension. Echo 4/23 with EF 60-65%, mildly enlarged RV with mildly decreased RV function. Echo 3/24 showed EF 60-65% with mildly D-shaped septum, mild RV dilation with mildly decreased systolic function, IVC dilated, PASP 45 mmHg. NYHA class II, he is not volume overloaded on exam.  - Continue spironolactone 25 mg daily. BMET today. - Continue torsemide 10 mg daily. - Continue losartan 25 mg daily. - No Jardiance due to yeast infection.  2. 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  Moderate PH on RHC in 1/22.  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  Less likely group 1 PH.  No chronic PEs on 7/22 V/Q scan. 3. Chronic hypoxemic respiratory failure: Remote smoker.  Hilar and mediastinal lymphadenopathy on chest CT, no reported ILD.  Recurrent aspiration PNA in setting of chronic right vocal cord paralysis.  Was on home oxygen but able to stop after more effective diuresis, now just uses oxygen at night.  - Should follow with pulmonary.  4. CAD: s/p CABG in 2000.  Cath in 1/22 showed patent LIMA-LAD and SVG-D but occluded SVG-OM and SVG-RCA.  There was 60-70% ostial RCA stenosis and 95% proximal RCA stenosis with heavy calcification.  Discussed with  interventional, with normal EF plan was to proceed with initial medical management and proceed to PCI if chest pain worsened (PCI would require atherectomy). Chronic mild atypical chest pain.     - Continue ASA 81 and atorvastatin, good lipids 3/24 - Continue Imdur 120  mg daily for angina control.  - Off Toprol with bradycardia. 5. Atrial fibrillation: Permanent. Check INR today and will forward to PCP, who manages. - On warfarin.  6. PAD: Occluded right fem-pop bypass.  No claudication.  - Follows with [facility]. 7. Carotid stenosis: 40-59% LICA stenosis on 9/23 dopplers, followed at [facility].  8. AAA: 5 cm AAA on 2/24 abdominal CT.  - Planning EVAR soon (see #11). 9. Incontinence: Follows closely with urology.  10. Bradycardia: Given IV atropine recently but EMS with HR 30's; he was asymptomatic. - Now off beta blocker. - Zio (5/24) showed continuous atrial fibrillation, average HR 60 bpm. Rare PVCs. 11. Pre-Op Evaluation: Planning EVAR soon. He is at a moderate, but not prohibitive, risk for surgery from a cardiac perspective. Discussed with [name]. OK to hold warfarin 5 days before surgery.   I asked him to notify clinic when he has a surgery date so we can arrange a post hospital follow up 1-2 weeks afterwards. Otherwise, follow up in 3-4 months with [name].   [facility] FNP-BC 09/03/2022

## 2022-09-03 NOTE — Telephone Encounter (Signed)
   Name: Troy [name]  DOB: 11-17-1940  MRN: [id number]   Primary Cardiologist: Prentice Docker, MD (Inactive)  Chart reviewed as part of pre-operative protocol coverage. Troy [name] was last seen on 09/03/2022 by [name], FNP.  Per office visit note: "Pre-Op Evaluation: Planning EVAR soon. He is at a moderate, but not prohibitive, risk for surgery from a cardiac perspective. Discussed with [name]. OK to hold warfarin 5 days before surgery."  Coumadin is managed by PCP therefore further recommendations regarding Coumadin will need to come from PCP.  Aspirin prescribed by a noncardiology provider therefore recommendation for holding aspirin deferred to prescribing provider.    I will route this recommendation to the requesting party via Epic fax function and remove from pre-op pool. Please call with questions.  Carlos Levering, NP 09/03/2022, 4:20 PM

## 2022-09-04 DIAGNOSIS — E1165 Type 2 diabetes mellitus with hyperglycemia: Secondary | ICD-10-CM | POA: Diagnosis not present

## 2022-09-04 DIAGNOSIS — L209 Atopic dermatitis, unspecified: Secondary | ICD-10-CM | POA: Diagnosis not present

## 2022-09-04 DIAGNOSIS — L299 Pruritus, unspecified: Secondary | ICD-10-CM | POA: Diagnosis not present

## 2022-09-04 DIAGNOSIS — E6609 Other obesity due to excess calories: Secondary | ICD-10-CM | POA: Diagnosis not present

## 2022-09-04 DIAGNOSIS — Z6831 Body mass index (BMI) 31.0-31.9, adult: Secondary | ICD-10-CM | POA: Diagnosis not present

## 2022-09-05 ENCOUNTER — Telehealth: Payer: Self-pay

## 2022-09-05 ENCOUNTER — Other Ambulatory Visit: Payer: Self-pay

## 2022-09-05 DIAGNOSIS — I7143 Infrarenal abdominal aortic aneurysm, without rupture: Secondary | ICD-10-CM

## 2022-09-05 NOTE — Telephone Encounter (Signed)
Spoke with patient and scheduled for EVAR surgery on 6/26. Instructions reviewed and he verbalized understanding. Patient denies any problems with contrast media after last few studies or the need for pre-medication. Will also mail instructions per patient request.

## 2022-09-14 NOTE — Pre-Procedure Instructions (Signed)
Surgical Instructions    Your procedure is scheduled on September 26, 2022.  Report to [facility] Main Entrance "A" at 5:30 A.M., then check in with the Admitting office.  Call this number if you have problems the morning of surgery:  [phone]  If you have any questions prior to your surgery date call [phone]: Open Monday-Friday 8am-4pm If you experience any cold or flu symptoms such as cough, fever, chills, shortness of breath, etc. between now and your scheduled surgery, please notify us at the above number.     Remember:  Do not eat or drink after midnight the night before your surgery     Take these medicines the morning of surgery with A SIP OF WATER:  aspirin   atorvastatin (LIPITOR)   dutasteride (AVODART)   pantoprazole (PROTONIX)    May take these medicines IF NEEDED:  acetaminophen (TYLENOL)   albuterol (VENTOLIN HFA) inhaler   HYDROcodone-acetaminophen (NORCO)   ipratropium (ATROVENT) nasal spray   nitroGLYCERIN (NITROSTAT) - please call [phone] if taken prior to surgery   Per your surgeon, STOP taking your warfarin (COUMADIN), three days prior to surgery. Your last dose will be June 22nd. Please follow any additional instructions that were given to you by your prescribing physician.   As of today, STOP taking any Aleve, Naproxen, Ibuprofen, Motrin, Advil, Goody's, BC's, all herbal medications, fish oil, and all vitamins.           WHAT DO I DO ABOUT MY DIABETES MEDICATION?   Do not take metFORMIN (GLUCOPHAGE) the morning of surgery.    HOW TO MANAGE YOUR DIABETES BEFORE AND AFTER SURGERY  Why is it important to control my blood sugar before and after surgery? Improving blood sugar levels before and after surgery helps healing and can limit problems. A way of improving blood sugar control is eating a healthy diet by:  Eating less sugar and carbohydrates  Increasing activity/exercise  Talking with your doctor about reaching your blood sugar  goals High blood sugars (greater than 180 mg/dL) can raise your risk of infections and slow your recovery, so you will need to focus on controlling your diabetes during the weeks before surgery. Make sure that the doctor who takes care of your diabetes knows about your planned surgery including the date and location.  How do I manage my blood sugar before surgery? Check your blood sugar at least 4 times a day, starting 2 days before surgery, to make sure that the level is not too high or low.  Check your blood sugar the morning of your surgery when you wake up and every 2 hours until you get to the Short Stay unit.  If your blood sugar is less than 70 mg/dL, you will need to treat for low blood sugar: Do not take insulin. Treat a low blood sugar (less than 70 mg/dL) with  cup of clear juice (cranberry or apple), 4 glucose tablets, OR glucose gel. Recheck blood sugar in 15 minutes after treatment (to make sure it is greater than 70 mg/dL). If your blood sugar is not greater than 70 mg/dL on recheck, call [phone] for further instructions. Report your blood sugar to the short stay nurse when you get to Short Stay.  If you are admitted to the hospital after surgery: Your blood sugar will be checked by the staff and you will probably be given insulin after surgery (instead of oral diabetes medicines) to make sure you have good blood sugar levels. The goal for blood  sugar control after surgery is 80-180 mg/dL.             Do NOT Smoke (Tobacco/Vaping) for 24 hours prior to your procedure.  If you use a CPAP at night, you may bring your mask/headgear for your overnight stay.   Contacts, glasses, piercing's, hearing aid's, dentures or partials may not be worn into surgery, please bring cases for these belongings.    For patients admitted to the hospital, discharge time will be determined by your treatment team.   Patients discharged the day of surgery will not be allowed to drive home, and  someone needs to stay with them for 24 hours.  SURGICAL WAITING ROOM VISITATION Patients having surgery or a procedure may have no more than 2 support people in the waiting area - these visitors may rotate.   Children under the age of 90 must have an adult with them who is not the patient. If the patient needs to stay at the hospital during part of their recovery, the visitor guidelines for inpatient rooms apply. Pre-op nurse will coordinate an appropriate time for 1 support person to accompany patient in pre-op.  This support person may not rotate.   Please refer to the [facility] website for the visitor guidelines for Inpatients (after your surgery is over and you are in a regular room).    Special instructions:   [place]- Preparing For Surgery  Before surgery, you can play an important role. Because skin is not sterile, your skin needs to be as free of germs as possible. You can reduce the number of germs on your skin by washing with CHG (chlorahexidine gluconate) Soap before surgery.  CHG is an antiseptic cleaner which kills germs and bonds with the skin to continue killing germs even after washing.    Oral Hygiene is also important to reduce your risk of infection.  Remember - BRUSH YOUR TEETH THE MORNING OF SURGERY WITH YOUR REGULAR TOOTHPASTE  Please do not use if you have an allergy to CHG or antibacterial soaps. If your skin becomes reddened/irritated stop using the CHG.  Do not shave (including legs and underarms) for at least 48 hours prior to first CHG shower. It is OK to shave your face.  Please follow these instructions carefully.   Shower the NIGHT BEFORE SURGERY and the MORNING OF SURGERY  If you chose to wash your hair, wash your hair first as usual with your normal shampoo.  After you shampoo, rinse your hair and body thoroughly to remove the shampoo.  Use CHG Soap as you would any other liquid soap. You can apply CHG directly to the skin and wash gently with a  scrungie or a clean washcloth.   Apply the CHG Soap to your body ONLY FROM THE NECK DOWN.  Do not use on open wounds or open sores. Avoid contact with your eyes, ears, mouth and genitals (private parts). Wash Face and genitals (private parts)  with your normal soap.   Wash thoroughly, paying special attention to the area where your surgery will be performed.  Thoroughly rinse your body with warm water from the neck down.  DO NOT shower/wash with your normal soap after using and rinsing off the CHG Soap.  Pat yourself dry with a CLEAN TOWEL.  Wear CLEAN PAJAMAS to bed the night before surgery  Place CLEAN SHEETS on your bed the night before your surgery  DO NOT SLEEP WITH PETS.   Day of Surgery: Take a shower with CHG  soap. Do not wear jewelry or makeup Do not wear lotions, powders, perfumes/colognes, or deodorant. Do not shave 48 hours prior to surgery.  Men may shave face and neck. Do not bring valuables to the hospital.  [facility] is not responsible for any belongings or valuables. Do not wear nail polish, gel polish, artificial nails, or any other type of covering on natural nails (fingers and toes) If you have artificial nails or gel coating that need to be removed by a nail salon, please have this removed prior to surgery. Artificial nails or gel coating may interfere with anesthesia's ability to adequately monitor your vital signs.  Wear Clean/Comfortable clothing the morning of surgery Remember to brush your teeth WITH YOUR REGULAR TOOTHPASTE.   Please read over the following fact sheets that you were given.    If you received a COVID test during your pre-op visit  it is requested that you wear a mask when out in public, stay away from anyone that may not be feeling well and notify your surgeon if you develop symptoms. If you have been in contact with anyone that has tested positive in the last 10 days please notify you surgeon.

## 2022-09-17 ENCOUNTER — Encounter (HOSPITAL_COMMUNITY): Payer: Self-pay

## 2022-09-17 ENCOUNTER — Other Ambulatory Visit: Payer: Self-pay

## 2022-09-17 ENCOUNTER — Encounter (HOSPITAL_COMMUNITY)
Admission: RE | Admit: 2022-09-17 | Discharge: 2022-09-17 | Disposition: A | Discharge status: Home / Self Care | Payer: Medicare HMO | Source: Ambulatory Visit | Attending: Vascular Surgery | Admitting: Vascular Surgery

## 2022-09-17 VITALS — BP 142/55 | HR 56 | Temp 97.7°F | Resp 18 | Ht 70.0 in | Wt 210.5 lb

## 2022-09-17 DIAGNOSIS — I482 Chronic atrial fibrillation, unspecified: Secondary | ICD-10-CM | POA: Insufficient documentation

## 2022-09-17 DIAGNOSIS — K219 Gastro-esophageal reflux disease without esophagitis: Secondary | ICD-10-CM | POA: Diagnosis not present

## 2022-09-17 DIAGNOSIS — E785 Hyperlipidemia, unspecified: Secondary | ICD-10-CM | POA: Diagnosis not present

## 2022-09-17 DIAGNOSIS — I7143 Infrarenal abdominal aortic aneurysm, without rupture: Secondary | ICD-10-CM | POA: Insufficient documentation

## 2022-09-17 DIAGNOSIS — I272 Pulmonary hypertension, unspecified: Secondary | ICD-10-CM | POA: Diagnosis not present

## 2022-09-17 DIAGNOSIS — Z951 Presence of aortocoronary bypass graft: Secondary | ICD-10-CM | POA: Insufficient documentation

## 2022-09-17 DIAGNOSIS — R310 Gross hematuria: Secondary | ICD-10-CM | POA: Insufficient documentation

## 2022-09-17 DIAGNOSIS — I2510 Atherosclerotic heart disease of native coronary artery without angina pectoris: Secondary | ICD-10-CM | POA: Insufficient documentation

## 2022-09-17 DIAGNOSIS — I4510 Unspecified right bundle-branch block: Secondary | ICD-10-CM | POA: Diagnosis not present

## 2022-09-17 DIAGNOSIS — I110 Hypertensive heart disease with heart failure: Secondary | ICD-10-CM | POA: Diagnosis not present

## 2022-09-17 DIAGNOSIS — Z87891 Personal history of nicotine dependence: Secondary | ICD-10-CM | POA: Diagnosis not present

## 2022-09-17 DIAGNOSIS — I7142 Juxtarenal abdominal aortic aneurysm, without rupture: Secondary | ICD-10-CM | POA: Insufficient documentation

## 2022-09-17 DIAGNOSIS — Z01818 Encounter for other preprocedural examination: Secondary | ICD-10-CM | POA: Diagnosis not present

## 2022-09-17 DIAGNOSIS — I5032 Chronic diastolic (congestive) heart failure: Secondary | ICD-10-CM | POA: Diagnosis not present

## 2022-09-17 DIAGNOSIS — M47894 Other spondylosis, thoracic region: Secondary | ICD-10-CM | POA: Insufficient documentation

## 2022-09-17 DIAGNOSIS — E119 Type 2 diabetes mellitus without complications: Secondary | ICD-10-CM | POA: Insufficient documentation

## 2022-09-17 DIAGNOSIS — I252 Old myocardial infarction: Secondary | ICD-10-CM | POA: Diagnosis not present

## 2022-09-17 HISTORY — DX: Dependence on supplemental oxygen: Z99.81

## 2022-09-17 HISTORY — DX: Unspecified atrial fibrillation: I48.91

## 2022-09-17 LAB — CBC
HCT: 42.1 % (ref 39.0–52.0)
Hemoglobin: 13.2 g/dL (ref 13.0–17.0)
MCH: 28.9 pg (ref 26.0–34.0)
MCHC: 31.4 g/dL (ref 30.0–36.0)
MCV: 92.1 fL (ref 80.0–100.0)
Platelets: 195 10*3/uL (ref 150–400)
RBC: 4.57 MIL/uL (ref 4.22–5.81)
RDW: 14.9 % (ref 11.5–15.5)
WBC: 8.3 10*3/uL (ref 4.0–10.5)
nRBC: 0 % (ref 0.0–0.2)

## 2022-09-17 LAB — COMPREHENSIVE METABOLIC PANEL
ALT: 13 U/L (ref 0–44)
AST: 25 U/L (ref 15–41)
Albumin: 3.8 g/dL (ref 3.5–5.0)
Alkaline Phosphatase: 70 U/L (ref 38–126)
Anion gap: 11 (ref 5–15)
BUN: 25 mg/dL — ABNORMAL HIGH (ref 8–23)
CO2: 24 mmol/L (ref 22–32)
Calcium: 8.8 mg/dL — ABNORMAL LOW (ref 8.9–10.3)
Chloride: 101 mmol/L (ref 98–111)
Creatinine, Ser: 1 mg/dL (ref 0.61–1.24)
GFR, Estimated: >60 mL/min (ref >60)
Glucose, Bld: 95 mg/dL (ref 70–99)
Potassium: 4.2 mmol/L (ref 3.5–5.1)
Sodium: 136 mmol/L (ref 135–145)
Total Bilirubin: 0.9 mg/dL (ref 0.3–1.2)
Total Protein: 7.2 g/dL (ref 6.5–8.1)

## 2022-09-17 LAB — APTT: aPTT: 37 seconds — ABNORMAL HIGH (ref 24–36)

## 2022-09-17 LAB — GLUCOSE, CAPILLARY: Glucose-Capillary: 92 mg/dL (ref 70–99)

## 2022-09-17 LAB — URINALYSIS, ROUTINE W REFLEX MICROSCOPIC
Bilirubin Urine: NEGATIVE
Glucose, UA: NEGATIVE mg/dL
Hgb urine dipstick: NEGATIVE
Ketones, ur: NEGATIVE mg/dL
Leukocytes,Ua: NEGATIVE
Nitrite: NEGATIVE
Protein, ur: NEGATIVE mg/dL
Specific Gravity, Urine: 1.017 (ref 1.005–1.030)
pH: 6 (ref 5.0–8.0)

## 2022-09-17 LAB — HEMOGLOBIN A1C
Hgb A1c MFr Bld: 7.5 % — ABNORMAL HIGH (ref 4.8–5.6)
Mean Plasma Glucose: 168.55 mg/dL

## 2022-09-17 LAB — PROTIME-INR
INR: 2.3 — ABNORMAL HIGH (ref 0.8–1.2)
Prothrombin Time: 25.1 seconds — ABNORMAL HIGH (ref 11.4–15.2)

## 2022-09-17 LAB — SURGICAL PCR SCREEN
MRSA, PCR: NEGATIVE
Staphylococcus aureus: NEGATIVE

## 2022-09-17 NOTE — Progress Notes (Addendum)
PCP - Assunta Found Cardiologist - Fransico Meadow  PPM/ICD - denies Device Orders -  Rep Notified -   Chest x-ray - 07/10/22 EKG - 09/03/22 Stress Test - 2008 ECHO - 06/19/22 Cardiac Cath - 04/11/20  Sleep Study - na CPAP - noa  Fasting Blood Sugar - na Checks Blood Sugar _____ times a day  Last dose of GLP1 agonist- na  GLP1 instructions:   Blood Thinner Instructions:Pt's last dose of Coumadin is June 22 per Dr. Ophelia Charter instructions. Aspirin Instructions:  ERAS Protcol -no PRE-SURGERY Ensure or G2-   COVID TEST- na   Anesthesia review: yes-cardiac clearance,home O2 use -uses 3L at night  Patient denies shortness of breath, fever, cough and chest pain at PAT appointment   All instructions explained to the patient, with a verbal understanding of the material. Patient agrees to go over the instructions while at home for a better understanding. Patient also instructed to wear a mask when out in public prior to surgery. The opportunity to ask questions was provided.

## 2022-09-18 ENCOUNTER — Encounter (HOSPITAL_COMMUNITY): Payer: Self-pay

## 2022-09-18 NOTE — Anesthesia Preprocedure Evaluation (Addendum)
Anesthesia Evaluation  Patient identified by MRN, date of birth, ID band Patient awake    Reviewed: Allergy & Precautions, NPO status , Patient's Chart, lab work & pertinent test results  History of Anesthesia Complications Negative for: history of anesthetic complications  Airway Mallampati: III  TM Distance: >3 FB Neck ROM: Full    Dental  (+) Edentulous Lower, Edentulous Upper   Pulmonary former smoker  Nocturnal O2    Pulmonary exam normal        Cardiovascular hypertension, Pt. on medications pulmonary hypertension+ CAD, + Past MI, + CABG and + Peripheral Vascular Disease  Normal cardiovascular exam+ dysrhythmias Atrial Fibrillation    '24 TTE - EF 60 to 65%.  D-shaped septum suggesting RV pressure/volume overload. Right ventricular systolic function is mildly reduced. The right ventricular  size is mildly enlarged. There is moderately elevated pulmonary artery systolic pressure. LA and RA were moderately dilated. Trivial MR. There is mild dilatation of the aortic root, measuring 42 mm.      Neuro/Psych negative neurological ROS  negative psych ROS   GI/Hepatic Neg liver ROS, PUD,GERD  Medicated and Controlled,,  Endo/Other  diabetes, Type 2, Oral Hypoglycemic Agents   Obesity   Renal/GU  RAS      Musculoskeletal  (+) Arthritis ,    Abdominal   Peds  Hematology  On coumadin    Anesthesia Other Findings Right vocal cord paralysis   Reproductive/Obstetrics                             Anesthesia Physical Anesthesia Plan  ASA: 3  Anesthesia Plan: General   Post-op Pain Management: Tylenol PO (pre-op)*   Induction: Intravenous  PONV Risk Score and Plan: 2 and Treatment may vary due to age or medical condition, Ondansetron and Propofol infusion  Airway Management Planned: Oral ETT and Video Laryngoscope Planned  Additional Equipment: Arterial line  Intra-op Plan:    Post-operative Plan: Extubation in OR  Informed Consent: I have reviewed the patients History and Physical, chart, labs and discussed the procedure including the risks, benefits and alternatives for the proposed anesthesia with the patient or authorized representative who has indicated his/her understanding and acceptance.     Dental advisory given  Plan Discussed with: CRNA and Anesthesiologist  Anesthesia Plan Comments: (See PAT note 2 large bore PIV )       Anesthesia Quick Evaluation

## 2022-09-18 NOTE — Progress Notes (Signed)
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  History includes former smoker (quit 04/21/18), HTN, CAD (MI 1999, CABG 2000; known occluded SVG-OM & SVG-RCA since at least 09/26/12;patent LIMA-LAD, SVG-D, 60-70% calcified oRCA & 95% calcified pRCA, known CTO PLV with collaterals, medical therapy 04/11/20; known "chronic mild atypical chest pain"), chronic diastolic CHF, pulmonary hypertension (moderate by 04/11/20 RHC; RVSP 45 mmHg 06/19/22 echo), sinus bradycardia (s/p atropine 07/10/22 for HR 30; 07/2022 event monitor HR 36-101, avg 60 bpm), afib, murmur (moderate AV calcification, trivial MR 06/19/22 echo), dyslipidemia, DM2, PAD (right FPBG 07/19/99, occluded on 07/27/14 [place]; left FPBG 04/12/06), carotid artery stenosis (1-39% LICA, 40-59% RICA 12/06/21), AAA, renal artery stenosis, GERD, 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 Admission for right orchitis and epididymitis 05/2022.   He was last seen at [facility] on 09/03/2022 by [name], FNP.  Known mild chronic atypical chest pain. Chronic afib. Not volume overloaded on exam then. Continue spironolactone, torsemide, losartan, ASA, atorvastatin, Imdur, warfarin. Off Toprol due to bradycardia. Off Jardiance due to  yeast infection. 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 Less likely group 1 PH. No chronic PEs on 7/22 V/Q scan." Recurrent aspiration PNA in setting of chronic right vocal cord paralysis, required continuous home O2 10/2021, but now on nocturnal O2 at 3L only.  Per office visit note: "Pre-Op Evaluation: Planning EVAR soon. He is at a moderate, but not prohibitive, risk for surgery from a cardiac perspective. Discussed with [name]. OK to hold warfarin 5 days before surgery." Hospital follow-up 1-2 weeks postoperative recommended.   Last visit with urologist Dr. Ronne Binning 08/31/22. Continue observation of gross hematuria. Started on doxycyline for 14 days for recurrent right testicular pain.   INR 2.3 on 09/17/22. He was still on warfarin. Reported instructions to hold after 09/22/22 dose. He will need a pre-operative PT/INR prior to surgery.   Anesthesia team to evaluate on the day of surgery.    VS: BP (!) 142/55   Pulse (!) 56   Temp 36.5 C (Oral)   Resp 18   Ht 5\' 10"  (1.778 m)   Wt 95.5 kg   SpO2 95%   BMI 30.20 kg/m    PROVIDERS: Assunta Found, MD is PCP  Marca Ancona, MD is HF cardiologist Early, Tawanna Cooler, MD is vascular surgeon Wilkie Aye, MD is urologist   LABS: Preoperative labs noted. INR 2.3, PT 25.1, PTT 37. He is for repeat PT/PTT on the day of surgery. A1c 7.5%. (all labs ordered are listed, but only abnormal results are displayed)  Labs Reviewed  COMPREHENSIVE METABOLIC PANEL - Abnormal; Notable for the following components:      Result Value   BUN 25 (*)    Calcium 8.8 (*)    All other components within normal limits  PROTIME-INR - Abnormal; Notable for the following components:   Prothrombin Time 25.1 (*)    INR 2.3 (*)    All other components within normal limits  APTT - Abnormal; Notable for the following components:   aPTT 37 (*)    All other components within normal limits   URINALYSIS, ROUTINE W REFLEX MICROSCOPIC - Abnormal; Notable for the following components:   APPearance HAZY (*)    All other components within normal limits  HEMOGLOBIN A1C - Abnormal; Notable for the following components:   Hgb A1c MFr Bld 7.5 (*)    All other components within normal limits  SURGICAL PCR SCREEN  GLUCOSE, CAPILLARY  CBC  TYPE AND SCREEN    PFTs 05/05/20: FVC 3.79 (89%), post 3.77 (88%). FEV1 2.87 (94%), post 2.83 (92%). DLCO unc 13.20 (52%). Per [name], "PFTs not markedly abnormal but decreased DLCO suggests a pulmonary vascular problem (pulmonary hypertension)".    IMAGES: CTA Abd/pelvis 08/24/22: IMPRESSION: VASCULAR 1. Stable saccular aneurysm of the distal infrarenal abdominal aorta due to a contained rupture of a penetrating atherosclerotic ulcer. Maximal diameter is again 7.8 cm. This patient is already under the care of a vascular surgeon. This recommendation follows ACR consensus guidelines: White Paper of the ACR Incidental Findings Committee II on Vascular Findings. J Am Coll Radiol 2013; 10:789-794. 2. Fusiform aneurysmal dilation of the more proximal infrarenal abdominal aorta with a maximal diameter of 4.8 cm. Abdominal aortic aneurysm measuring 4.8 cm. Recommend follow-up every 6 months and vascular consultation. Reference: J Am Coll Radiol 2013;10:789-794. 3. 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 4. Extensive atherosclerotic vascular calcifications with areas of stenosis in the celiac artery and bilateral renal arteries as described above. 5. Chronic occlusion of right superficial femoral artery bypass graft. 6. 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   NON-VASCULAR 1. Numerous ancillary findings as above without significant interval change.     1V PCXR 07/10/22: FINDINGS: The heart is enlarged with evidence of prior coronary artery bypass grafting. Both lungs are  clear. Thoracic spondylosis. No acute osseous abnormality. IMPRESSION: Cardiomegaly with evidence of prior coronary artery bypass grafting. No acute cardiopulmonary process.   EKG: 09/03/22: Ventricular rate @ 65 bpm Atrial fibrillation with premature ventricular or aberrantly conducted complexes Incomplete right bundle branch block When compared with ECG of 10-Jul-2022 22:18, No significant change was found Confirmed by Rollene Rotunda ([id number]) on 09/03/2022 10:00:55 PM   CV: Long term monitor 07/20/22 - 08/03/22: Atrial Fibrillation occurred continuously (100% burden), ranging from 36-101 bpm (avg of 60 bpm). Bundle Branch Block/IVCD was present. Isolated VEs were occasional (1.9%, [id number]), VE Couplets were rare (<1.0%, 1046), and VE Triplets were rare (<1.0%, 40). Ventricular Bigeminy and Trigeminy were present.   Conclusion:  1. Continuous atrial fibrillation, average rate 60 bpm.  2. Rare PVCs.     Echo 06/19/22: IMPRESSIONS   1. Left ventricular ejection fraction, by estimation, is 60 to 65%. The  left ventricle has normal function. The left ventricle has no regional  wall motion abnormalities. Left ventricular diastolic parameters are  indeterminate.   2. D-shaped septum suggesting RV pressure/volume overload. Right  ventricular systolic function is mildly reduced. The right ventricular  size is mildly enlarged. There is moderately elevated pulmonary artery  systolic pressure. The estimated right  ventricular systolic pressure is 45.0 mmHg.   3. Left atrial size was moderately dilated.   4. Right atrial size was moderately dilated.  5. The mitral valve is normal in structure. Trivial mitral valve  regurgitation. No evidence of mitral stenosis. Moderate mitral annular  calcification.   6. The aortic valve is tricuspid. There is moderate calcification of the  aortic valve. Aortic valve regurgitation is not visualized. No aortic  stenosis is present.   7. Aortic dilatation  noted. There is mild dilatation of the aortic root,  measuring 42 mm.   8. The inferior vena cava is dilated in size with <50% respiratory  variability, suggesting right atrial pressure of 15 mmHg.   9. The patient is in atrial fibrillation.  - Comparison: 07/29/21: mild LVH, LVEF 55-60%, no RWMA, ventricular septum flatten in systole suggesting RV pressure overload, mildly reduced RVF, severely elevated PASP with TR velocity is 3.72 m/s and assumed RA pressure 8 mmHg and estimated RVSP 63.4 mmHg, severely dilated atria, mild MR/AR   US Carotid 12/06/21: Summary:  - Right Carotid: Velocities in the right ICA are consistent with a 1-39%  stenosis.  - Left Carotid: Velocities in the left ICA are consistent with a 40-59%  stenosis.  - Vertebrals: Bilateral vertebral arteries demonstrate antegrade flow.  - Subclavians: Normal flow hemodynamics were seen in bilateral subclavian  arteries.    RHC/LHC 04/11/20: 1. Moderate pulmonary hypertension.  2. Normal left and right heart filling pressures.  3. Patent LIMA-LAD and SVG-D.  Occluded SVG-OM and SVG-RCA.  4. Native RCA with 60-70% calcified ostial stenosis and 95% proximal stenosis.  Occluded large PLV branch with collaterals (known from prior).    The ostial and proximal RCA disease is progressive from prior. Discussed with Dr. Okey Dupre, would be somewhat complicated intervention requiring atherectomy ostially potentially.  Recommend initial medical management (increase Imdur) and can readdress PCI if remains symptomatic.    Past Medical History:  Diagnosis Date   A-fib [facility])    AAA (abdominal aortic aneurysm) (HCC)    Followed by Dr. Tawanna Cooler Early   Arthritis    CAD (coronary artery disease)    a. CABG 2000.   Cancer [facility])    Prostate:  Radiation Tx   Carotid artery disease (HCC)    Chest pain    precordial. mild chronic .[name]... nonischemic   CHF (congestive heart failure) (HCC)    Chronic edema    Coronary artery disease    a. Nuclear,  January, 2008, no ischemia b. Cath 08/2012- 1/4 patent grafts, RCA CTO, no flow-limiting disease, medically managed   Diabetes mellitus without complication (HCC)    Dizziness 02/2011   Dyslipidemia    Fall    GERD (gastroesophageal reflux disease)    TAKES TUMS & ROLAIDS AS NEEDED   Heart murmur    History of home oxygen therapy    3L nocturnal O2   Hx of CABG 2000   Hypertension    Myocardial infarction [facility]) 1999   Neck pain 02/2011   Paralysis of right vocal fold 05/02/2015   Pneumonia    Pulmonary hypertension (HCC)    Renal artery stenosis (HCC)    50-70%   S/P femoropopliteal bypass surgery    Dr. Arbie Cookey   Sinus bradycardia    Asymptomatic    Past Surgical History:  Procedure Laterality Date   CARDIAC CATHETERIZATION  09/26/2012   1/4 patent bypass (occluded SVG-PDA, SVG-OM, LIMA-LAD), SVG-diagonal patent and fills the diagonal and LAD, distal RCA occlusion with left to right collateralization, patent circumflex, LAD with no flow-limiting disease and antegrade flow competitively from SVG-diagonal; EF 60-65%   COLONOSCOPY  11/30/2009  WUJ:[id number]. next TCS 11/2019   COLONOSCOPY WITH PROPOFOL N/A 12/18/2019   Procedure: COLONOSCOPY WITH PROPOFOL;  Surgeon: Dolores Frame, MD;  Location: [facility] ENDO SUITE;  Service: Gastroenterology;  Laterality: N/A;  1030   CORONARY ARTERY BYPASS GRAFT  2000   CYSTOSCOPY N/A 08/31/2021   Procedure: CYSTOSCOPY;  Surgeon: Malen Gauze, MD;  Location: [facility] ORS;  Service: Urology;  Laterality: N/A;  pt knows to arrive at 7:00   CYSTOSCOPY WITH FULGERATION N/A 03/22/2022   Procedure: CYSTOSCOPY WITH FULGERATION;  Surgeon: Malen Gauze, MD;  Location: [facility] ORS;  Service: Urology;  Laterality: N/A;   ESOPHAGOGASTRODUODENOSCOPY N/A 08/12/2013   HYQ:[id number] peptic stricture with erosive refluxesophagitis - status post Maloney dilation. Hiatal hernia. Abnormalgastric mucosa. Deformity of the pyloric channel suggestive  ofprior peptic ulcer disease. Duodenal bulbar diverticulum Statuspost gastric biopsy. h.pylori   LEFT HEART CATHETERIZATION WITH CORONARY/GRAFT ANGIOGRAM N/A 09/26/2012   Procedure: LEFT HEART CATHETERIZATION WITH Isabel Caprice;  Surgeon: Kathleene Hazel, MD;  Location: [facility] CATH LAB;  Service: Cardiovascular;  Laterality: N/A;   MALONEY DILATION N/A 08/12/2013   Procedure: Elease Hashimoto DILATION;  Surgeon: Corbin Ade, MD;  Location: [facility];  Service: Endoscopy;  Laterality: N/A;   POLYPECTOMY  12/18/2019   Procedure: POLYPECTOMY;  Surgeon: Dolores Frame, MD;  Location: [facility];  Service: Gastroenterology;;  ascending colon polyp    PR VEIN BYPASS GRAFT,AORTO-FEM-POP Right 07/19/1999   PR VEIN BYPASS GRAFT,AORTO-FEM-POP Left 05/02/2006   RIGHT HEART CATH AND CORONARY/GRAFT ANGIOGRAPHY N/A 04/11/2020   Procedure: RIGHT HEART CATH AND CORONARY/GRAFT ANGIOGRAPHY;  Surgeon: Laurey Morale, MD;  Location: [facility] INVASIVE CV LAB;  Service: Cardiovascular;  Laterality: N/A;   SAVORY DILATION N/A 08/12/2013   Procedure: SAVORY DILATION;  Surgeon: Corbin Ade, MD;  Location: [facility];  Service: Endoscopy;  Laterality: N/A;   TRANSURETHRAL RESECTION OF BLADDER TUMOR N/A 08/31/2021   Procedure: TRANSURETHRAL RESECTION OF BLADDER TUMOR (TURBT);  Surgeon: Malen Gauze, MD;  Location: [facility] ORS;  Service: Urology;  Laterality: N/A;   VASCULAR SURGERY     WRIST SURGERY     cyst removal    MEDICATIONS:  acetaminophen (TYLENOL) 325 MG tablet   albuterol (VENTOLIN HFA) 108 (90 Base) MCG/ACT inhaler   aspirin 81 MG EC tablet   atorvastatin (LIPITOR) 40 MG tablet   Cholecalciferol (VITAMIN D3) 50 MCG (2000 UT) capsule   doxycycline (VIBRAMYCIN) 100 MG capsule   dutasteride (AVODART) 0.5 MG capsule   HYDROcodone-acetaminophen (NORCO) 10-325 MG tablet   ipratropium (ATROVENT) 0.03 % nasal spray   isosorbide mononitrate (IMDUR) 60 MG 24 hr tablet   latanoprost  (XALATAN) 0.005 % ophthalmic solution   levocetirizine (XYZAL) 5 MG tablet   losartan (COZAAR) 25 MG tablet   metFORMIN (GLUCOPHAGE) 500 MG tablet   nitroGLYCERIN (NITROSTAT) 0.4 MG SL tablet   OXYGEN   pantoprazole (PROTONIX) 40 MG tablet   spironolactone (ALDACTONE) 25 MG tablet   tamsulosin (FLOMAX) 0.4 MG CAPS capsule   torsemide (DEMADEX) 10 MG tablet   triamcinolone cream (KENALOG) 0.1 %   warfarin (COUMADIN) 2 MG tablet   No current facility-administered medications for this encounter.   He is no longer on doxycycline.    Shonna Chock, PA-C Surgical Short Stay/Anesthesiology [facility] Phone [phone] [facility] Phone [phone] 09/18/2022 1:16 PM

## 2022-09-26 ENCOUNTER — Other Ambulatory Visit: Payer: Self-pay

## 2022-09-26 ENCOUNTER — Inpatient Hospital Stay (HOSPITAL_COMMUNITY)
Admission: RE | Admit: 2022-09-26 | Discharge: 2022-10-06 | DRG: 269 | Disposition: A | Discharge status: Skilled Nursing Facility | Payer: Medicare HMO | Attending: Vascular Surgery | Admitting: Vascular Surgery

## 2022-09-26 ENCOUNTER — Inpatient Hospital Stay (HOSPITAL_COMMUNITY): Payer: Medicare HMO

## 2022-09-26 ENCOUNTER — Inpatient Hospital Stay (HOSPITAL_COMMUNITY): Payer: Medicare HMO | Admitting: Anesthesiology

## 2022-09-26 ENCOUNTER — Encounter (HOSPITAL_COMMUNITY)
Admission: RE | Disposition: A | Discharge status: Skilled Nursing Facility | Payer: Self-pay | Source: Home / Self Care | Attending: Vascular Surgery

## 2022-09-26 ENCOUNTER — Inpatient Hospital Stay (HOSPITAL_COMMUNITY): Payer: Medicare HMO | Admitting: Vascular Surgery

## 2022-09-26 ENCOUNTER — Encounter (HOSPITAL_COMMUNITY): Payer: Self-pay | Admitting: Vascular Surgery

## 2022-09-26 DIAGNOSIS — N503 Cyst of epididymis: Secondary | ICD-10-CM | POA: Diagnosis not present

## 2022-09-26 DIAGNOSIS — Z803 Family history of malignant neoplasm of breast: Secondary | ICD-10-CM

## 2022-09-26 DIAGNOSIS — Z8546 Personal history of malignant neoplasm of prostate: Secondary | ICD-10-CM | POA: Diagnosis not present

## 2022-09-26 DIAGNOSIS — I10 Essential (primary) hypertension: Secondary | ICD-10-CM | POA: Diagnosis present

## 2022-09-26 DIAGNOSIS — Z8249 Family history of ischemic heart disease and other diseases of the circulatory system: Secondary | ICD-10-CM

## 2022-09-26 DIAGNOSIS — Z801 Family history of malignant neoplasm of trachea, bronchus and lung: Secondary | ICD-10-CM

## 2022-09-26 DIAGNOSIS — I714 Abdominal aortic aneurysm, without rupture, unspecified: Principal | ICD-10-CM | POA: Diagnosis present

## 2022-09-26 DIAGNOSIS — Z888 Allergy status to other drugs, medicaments and biological substances status: Secondary | ICD-10-CM | POA: Diagnosis not present

## 2022-09-26 DIAGNOSIS — Z951 Presence of aortocoronary bypass graft: Secondary | ICD-10-CM

## 2022-09-26 DIAGNOSIS — Z01818 Encounter for other preprocedural examination: Secondary | ICD-10-CM

## 2022-09-26 DIAGNOSIS — Z7901 Long term (current) use of anticoagulants: Secondary | ICD-10-CM | POA: Diagnosis not present

## 2022-09-26 DIAGNOSIS — Z886 Allergy status to analgesic agent status: Secondary | ICD-10-CM

## 2022-09-26 DIAGNOSIS — E119 Type 2 diabetes mellitus without complications: Secondary | ICD-10-CM | POA: Diagnosis present

## 2022-09-26 DIAGNOSIS — E109 Type 1 diabetes mellitus without complications: Secondary | ICD-10-CM | POA: Diagnosis not present

## 2022-09-26 DIAGNOSIS — R32 Unspecified urinary incontinence: Secondary | ICD-10-CM | POA: Diagnosis present

## 2022-09-26 DIAGNOSIS — I517 Cardiomegaly: Secondary | ICD-10-CM | POA: Diagnosis not present

## 2022-09-26 DIAGNOSIS — R918 Other nonspecific abnormal finding of lung field: Secondary | ICD-10-CM | POA: Diagnosis not present

## 2022-09-26 DIAGNOSIS — I97418 Intraoperative hemorrhage and hematoma of a circulatory system organ or structure complicating other circulatory system procedure: Secondary | ICD-10-CM | POA: Diagnosis not present

## 2022-09-26 DIAGNOSIS — Z87891 Personal history of nicotine dependence: Secondary | ICD-10-CM | POA: Diagnosis not present

## 2022-09-26 DIAGNOSIS — Y652 Failure in suture or ligature during surgical operation: Secondary | ICD-10-CM | POA: Diagnosis not present

## 2022-09-26 DIAGNOSIS — D62 Acute posthemorrhagic anemia: Secondary | ICD-10-CM | POA: Diagnosis not present

## 2022-09-26 DIAGNOSIS — I7143 Infrarenal abdominal aortic aneurysm, without rupture: Secondary | ICD-10-CM | POA: Diagnosis not present

## 2022-09-26 DIAGNOSIS — H409 Unspecified glaucoma: Secondary | ICD-10-CM | POA: Diagnosis not present

## 2022-09-26 DIAGNOSIS — Z832 Family history of diseases of the blood and blood-forming organs and certain disorders involving the immune mechanism: Secondary | ICD-10-CM | POA: Diagnosis not present

## 2022-09-26 DIAGNOSIS — Z7982 Long term (current) use of aspirin: Secondary | ICD-10-CM

## 2022-09-26 DIAGNOSIS — Z833 Family history of diabetes mellitus: Secondary | ICD-10-CM

## 2022-09-26 DIAGNOSIS — K219 Gastro-esophageal reflux disease without esophagitis: Secondary | ICD-10-CM | POA: Diagnosis present

## 2022-09-26 DIAGNOSIS — I5030 Unspecified diastolic (congestive) heart failure: Secondary | ICD-10-CM | POA: Diagnosis not present

## 2022-09-26 DIAGNOSIS — E1151 Type 2 diabetes mellitus with diabetic peripheral angiopathy without gangrene: Secondary | ICD-10-CM | POA: Diagnosis not present

## 2022-09-26 DIAGNOSIS — I272 Pulmonary hypertension, unspecified: Secondary | ICD-10-CM | POA: Diagnosis present

## 2022-09-26 DIAGNOSIS — Z91041 Radiographic dye allergy status: Secondary | ICD-10-CM | POA: Diagnosis not present

## 2022-09-26 DIAGNOSIS — I110 Hypertensive heart disease with heart failure: Secondary | ICD-10-CM | POA: Diagnosis not present

## 2022-09-26 DIAGNOSIS — I2510 Atherosclerotic heart disease of native coronary artery without angina pectoris: Secondary | ICD-10-CM

## 2022-09-26 DIAGNOSIS — Z83438 Family history of other disorder of lipoprotein metabolism and other lipidemia: Secondary | ICD-10-CM

## 2022-09-26 DIAGNOSIS — L7632 Postprocedural hematoma of skin and subcutaneous tissue following other procedure: Secondary | ICD-10-CM | POA: Diagnosis not present

## 2022-09-26 DIAGNOSIS — I4891 Unspecified atrial fibrillation: Secondary | ICD-10-CM | POA: Diagnosis not present

## 2022-09-26 DIAGNOSIS — I252 Old myocardial infarction: Secondary | ICD-10-CM

## 2022-09-26 DIAGNOSIS — R059 Cough, unspecified: Secondary | ICD-10-CM | POA: Diagnosis not present

## 2022-09-26 DIAGNOSIS — E785 Hyperlipidemia, unspecified: Secondary | ICD-10-CM | POA: Diagnosis present

## 2022-09-26 DIAGNOSIS — N401 Enlarged prostate with lower urinary tract symptoms: Secondary | ICD-10-CM | POA: Diagnosis not present

## 2022-09-26 DIAGNOSIS — Z7984 Long term (current) use of oral hypoglycemic drugs: Secondary | ICD-10-CM | POA: Diagnosis not present

## 2022-09-26 DIAGNOSIS — Z79899 Other long term (current) drug therapy: Secondary | ICD-10-CM

## 2022-09-26 DIAGNOSIS — M6281 Muscle weakness (generalized): Secondary | ICD-10-CM | POA: Diagnosis not present

## 2022-09-26 DIAGNOSIS — N5082 Scrotal pain: Secondary | ICD-10-CM | POA: Diagnosis not present

## 2022-09-26 DIAGNOSIS — I480 Paroxysmal atrial fibrillation: Secondary | ICD-10-CM | POA: Diagnosis not present

## 2022-09-26 DIAGNOSIS — N182 Chronic kidney disease, stage 2 (mild): Secondary | ICD-10-CM | POA: Diagnosis not present

## 2022-09-26 DIAGNOSIS — E1122 Type 2 diabetes mellitus with diabetic chronic kidney disease: Secondary | ICD-10-CM | POA: Diagnosis not present

## 2022-09-26 DIAGNOSIS — N433 Hydrocele, unspecified: Secondary | ICD-10-CM | POA: Diagnosis present

## 2022-09-26 DIAGNOSIS — Z809 Family history of malignant neoplasm, unspecified: Secondary | ICD-10-CM

## 2022-09-26 DIAGNOSIS — D6869 Other thrombophilia: Secondary | ICD-10-CM | POA: Diagnosis not present

## 2022-09-26 DIAGNOSIS — I129 Hypertensive chronic kidney disease with stage 1 through stage 4 chronic kidney disease, or unspecified chronic kidney disease: Secondary | ICD-10-CM | POA: Diagnosis not present

## 2022-09-26 DIAGNOSIS — I25708 Atherosclerosis of coronary artery bypass graft(s), unspecified, with other forms of angina pectoris: Secondary | ICD-10-CM | POA: Diagnosis not present

## 2022-09-26 HISTORY — PX: ULTRASOUND GUIDANCE FOR VASCULAR ACCESS: SHX6516

## 2022-09-26 HISTORY — PX: ENDARTERECTOMY FEMORAL: SHX5804

## 2022-09-26 HISTORY — PX: AORTOGRAM: SHX6300

## 2022-09-26 HISTORY — PX: ABDOMINAL AORTIC ENDOVASCULAR STENT GRAFT: SHX5707

## 2022-09-26 LAB — POCT I-STAT 7, (LYTES, BLD GAS, ICA,H+H)
Acid-base deficit: 4 mmol/L — ABNORMAL HIGH (ref 0.0–2.0)
Bicarbonate: 21.5 mmol/L (ref 20.0–28.0)
Calcium, Ion: 1.18 mmol/L (ref 1.15–1.40)
HCT: 26 % — ABNORMAL LOW (ref 39.0–52.0)
Hemoglobin: 8.8 g/dL — ABNORMAL LOW (ref 13.0–17.0)
O2 Saturation: 98 %
Patient temperature: 36.8
Potassium: 4.5 mmol/L (ref 3.5–5.1)
Sodium: 138 mmol/L (ref 135–145)
TCO2: 23 mmol/L (ref 22–32)
pCO2 arterial: 41.9 mmHg (ref 32–48)
pH, Arterial: 7.317 — ABNORMAL LOW (ref 7.35–7.45)
pO2, Arterial: 110 mmHg — ABNORMAL HIGH (ref 83–108)

## 2022-09-26 LAB — BPAM FFP
ISSUE DATE / TIME: 202406261629
Unit Type and Rh: 6200

## 2022-09-26 LAB — ABO/RH: ABO/RH(D): A POS

## 2022-09-26 LAB — CBC
HCT: 27 % — ABNORMAL LOW (ref 39.0–52.0)
HCT: 26.8 % — ABNORMAL LOW (ref 39.0–52.0)
Hemoglobin: 8.7 g/dL — ABNORMAL LOW (ref 13.0–17.0)
Hemoglobin: 8.7 g/dL — ABNORMAL LOW (ref 13.0–17.0)
MCH: 29.1 pg (ref 26.0–34.0)
MCH: 29.6 pg (ref 26.0–34.0)
MCHC: 32.2 g/dL (ref 30.0–36.0)
MCHC: 32.5 g/dL (ref 30.0–36.0)
MCV: 90.3 fL (ref 80.0–100.0)
MCV: 91.2 fL (ref 80.0–100.0)
Platelets: 135 10*3/uL — ABNORMAL LOW (ref 150–400)
Platelets: 132 10*3/uL — ABNORMAL LOW (ref 150–400)
RBC: 2.99 MIL/uL — ABNORMAL LOW (ref 4.22–5.81)
RBC: 2.94 MIL/uL — ABNORMAL LOW (ref 4.22–5.81)
RDW: 15.7 % — ABNORMAL HIGH (ref 11.5–15.5)
RDW: 15.9 % — ABNORMAL HIGH (ref 11.5–15.5)
WBC: 10.4 10*3/uL (ref 4.0–10.5)
WBC: 11.3 10*3/uL — ABNORMAL HIGH (ref 4.0–10.5)
nRBC: 0 % (ref 0.0–0.2)
nRBC: 0 % (ref 0.0–0.2)

## 2022-09-26 LAB — TYPE AND SCREEN

## 2022-09-26 LAB — PROTIME-INR
INR: 1.8 — ABNORMAL HIGH (ref 0.8–1.2)
INR: 2.2 — ABNORMAL HIGH (ref 0.8–1.2)
Prothrombin Time: 21.2 seconds — ABNORMAL HIGH (ref 11.4–15.2)
Prothrombin Time: 25 seconds — ABNORMAL HIGH (ref 11.4–15.2)

## 2022-09-26 LAB — PREPARE FRESH FROZEN PLASMA: Unit division: 0

## 2022-09-26 LAB — BASIC METABOLIC PANEL
Anion gap: 10 (ref 5–15)
BUN: 21 mg/dL (ref 8–23)
CO2: 21 mmol/L — ABNORMAL LOW (ref 22–32)
Calcium: 7.7 mg/dL — ABNORMAL LOW (ref 8.9–10.3)
Chloride: 106 mmol/L (ref 98–111)
Creatinine, Ser: 1.11 mg/dL (ref 0.61–1.24)
GFR, Estimated: >60 mL/min (ref >60)
Glucose, Bld: 191 mg/dL — ABNORMAL HIGH (ref 70–99)
Potassium: 3.9 mmol/L (ref 3.5–5.1)
Sodium: 137 mmol/L (ref 135–145)

## 2022-09-26 LAB — PREPARE RBC (CROSSMATCH)

## 2022-09-26 LAB — POCT ACTIVATED CLOTTING TIME
Activated Clotting Time: 256 seconds
Activated Clotting Time: 207 seconds
Activated Clotting Time: 220 seconds

## 2022-09-26 LAB — GLUCOSE, CAPILLARY
Glucose-Capillary: 118 mg/dL — ABNORMAL HIGH (ref 70–99)
Glucose-Capillary: 214 mg/dL — ABNORMAL HIGH (ref 70–99)
Glucose-Capillary: 159 mg/dL — ABNORMAL HIGH (ref 70–99)
Glucose-Capillary: 160 mg/dL — ABNORMAL HIGH (ref 70–99)

## 2022-09-26 LAB — MAGNESIUM: Magnesium: 1.2 mg/dL — ABNORMAL LOW (ref 1.7–2.4)

## 2022-09-26 LAB — BPAM RBC: ISSUE DATE / TIME: 202406261119

## 2022-09-26 LAB — APTT
aPTT: 33 seconds (ref 24–36)
aPTT: 32 seconds (ref 24–36)

## 2022-09-26 SURGERY — INSERTION, ENDOVASCULAR STENT GRAFT, AORTA, ABDOMINAL
Anesthesia: General | Site: Groin | Laterality: Right

## 2022-09-26 MED ORDER — ALBUTEROL SULFATE (2.5 MG/3ML) 0.083% IN NEBU
3.0000 mL | INHALATION_SOLUTION | Freq: Four times a day (QID) | RESPIRATORY_TRACT | Status: DC | PRN
  Administered 2022-09-30: 3 mL via RESPIRATORY_TRACT
  Filled 2022-09-26: qty 3

## 2022-09-26 MED ORDER — INSULIN ASPART 100 UNIT/ML IJ SOLN: 0.0000 [IU] | INTRAMUSCULAR | Status: DC | PRN

## 2022-09-26 MED ORDER — INSULIN ASPART 100 UNIT/ML IJ SOLN
0.0000 [IU] | Freq: Three times a day (TID) | INTRAMUSCULAR | Status: DC
  Administered 2022-09-26: 3 [IU] via SUBCUTANEOUS
  Administered 2022-09-27 – 2022-10-03 (×8): 2 [IU] via SUBCUTANEOUS

## 2022-09-26 MED ORDER — HEPARIN 6000 UNIT IRRIGATION SOLUTION
Status: AC
  Filled 2022-09-26: qty 500

## 2022-09-26 MED ORDER — LIDOCAINE 2% (20 MG/ML) 5 ML SYRINGE
INTRAMUSCULAR | Status: AC
  Filled 2022-09-26: qty 5

## 2022-09-26 MED ORDER — ISOSORBIDE MONONITRATE ER 60 MG PO TB24
120.0000 mg | ORAL_TABLET | Freq: Every evening | ORAL | Status: DC
  Administered 2022-09-26 – 2022-10-05 (×9): 120 mg via ORAL
  Filled 2022-09-26 (×11): qty 2

## 2022-09-26 MED ORDER — PROPOFOL 1000 MG/100ML IV EMUL
INTRAVENOUS | Status: AC
  Filled 2022-09-26: qty 100

## 2022-09-26 MED ORDER — DOCUSATE SODIUM 100 MG PO CAPS
100.0000 mg | ORAL_CAPSULE | Freq: Every day | ORAL | Status: DC
  Administered 2022-09-27 – 2022-10-06 (×9): 100 mg via ORAL
  Filled 2022-09-26 (×10): qty 1

## 2022-09-26 MED ORDER — GUAIFENESIN-DM 100-10 MG/5ML PO SYRP
15.0000 mL | ORAL_SOLUTION | ORAL | Status: DC | PRN
  Administered 2022-10-01 – 2022-10-02 (×2): 15 mL via ORAL
  Filled 2022-09-26 (×3): qty 15

## 2022-09-26 MED ORDER — OXYCODONE HCL 5 MG/5ML PO SOLN: 5.0000 mg | Freq: Once | ORAL | Status: DC | PRN

## 2022-09-26 MED ORDER — FENTANYL CITRATE (PF) 250 MCG/5ML IJ SOLN
INTRAMUSCULAR | Status: DC | PRN
  Administered 2022-09-26 (×2): 50 ug via INTRAVENOUS

## 2022-09-26 MED ORDER — LOSARTAN POTASSIUM 25 MG PO TABS
25.0000 mg | ORAL_TABLET | Freq: Every day | ORAL | Status: DC
  Administered 2022-09-27 – 2022-10-06 (×10): 25 mg via ORAL
  Filled 2022-09-26 (×10): qty 1

## 2022-09-26 MED ORDER — ACETAMINOPHEN 325 MG PO TABS
325.0000 mg | ORAL_TABLET | ORAL | Status: DC | PRN
  Administered 2022-09-26 – 2022-10-02 (×3): 650 mg via ORAL
  Filled 2022-09-26 (×3): qty 2

## 2022-09-26 MED ORDER — MORPHINE SULFATE (PF) 2 MG/ML IV SOLN
2.0000 mg | INTRAVENOUS | Status: DC | PRN
  Administered 2022-09-29 (×2): 2 mg via INTRAVENOUS
  Administered 2022-09-30: 3 mg via INTRAVENOUS
  Administered 2022-09-30 – 2022-10-05 (×6): 2 mg via INTRAVENOUS
  Filled 2022-09-26 (×6): qty 1
  Filled 2022-09-26: qty 2
  Filled 2022-09-26 (×2): qty 1

## 2022-09-26 MED ORDER — FENTANYL CITRATE (PF) 100 MCG/2ML IJ SOLN: 25.0000 ug | INTRAMUSCULAR | Status: DC | PRN

## 2022-09-26 MED ORDER — SODIUM CHLORIDE 0.9% IV SOLUTION: Freq: Once | INTRAVENOUS | Status: AC

## 2022-09-26 MED ORDER — PHENOL 1.4 % MT LIQD: 1.0000 | OROMUCOSAL | Status: DC | PRN

## 2022-09-26 MED ORDER — PHENYLEPHRINE HCL-NACL 20-0.9 MG/250ML-% IV SOLN
INTRAVENOUS | Status: AC
  Filled 2022-09-26: qty 500

## 2022-09-26 MED ORDER — ACETAMINOPHEN 500 MG PO TABS
1000.0000 mg | ORAL_TABLET | Freq: Once | ORAL | Status: AC
  Administered 2022-09-26: 1000 mg via ORAL
  Filled 2022-09-26: qty 2

## 2022-09-26 MED ORDER — HYDRALAZINE HCL 20 MG/ML IJ SOLN: 5.0000 mg | INTRAMUSCULAR | Status: DC | PRN

## 2022-09-26 MED ORDER — VASOPRESSIN 20 UNIT/ML IV SOLN
INTRAVENOUS | Status: DC | PRN
  Administered 2022-09-26: 1 [IU] via INTRAVENOUS

## 2022-09-26 MED ORDER — PROPOFOL 10 MG/ML IV BOLUS
INTRAVENOUS | Status: AC
  Filled 2022-09-26: qty 20

## 2022-09-26 MED ORDER — CEFAZOLIN SODIUM-DEXTROSE 2-4 GM/100ML-% IV SOLN
2.0000 g | Freq: Three times a day (TID) | INTRAVENOUS | Status: AC
  Administered 2022-09-26 – 2022-09-27 (×2): 2 g via INTRAVENOUS
  Filled 2022-09-26 (×2): qty 100

## 2022-09-26 MED ORDER — CHLORHEXIDINE GLUCONATE 0.12 % MT SOLN
15.0000 mL | Freq: Once | OROMUCOSAL | Status: AC
  Administered 2022-09-26: 15 mL via OROMUCOSAL
  Filled 2022-09-26: qty 15

## 2022-09-26 MED ORDER — CHLORHEXIDINE GLUCONATE CLOTH 2 % EX PADS: 6.0000 | MEDICATED_PAD | Freq: Once | CUTANEOUS | Status: DC

## 2022-09-26 MED ORDER — ORAL CARE MOUTH RINSE: 15.0000 mL | Freq: Once | OROMUCOSAL | Status: AC

## 2022-09-26 MED ORDER — ALUM & MAG HYDROXIDE-SIMETH 200-200-20 MG/5ML PO SUSP
15.0000 mL | ORAL | Status: DC | PRN
  Administered 2022-09-26 – 2022-09-30 (×2): 30 mL via ORAL
  Filled 2022-09-26 (×2): qty 30

## 2022-09-26 MED ORDER — SODIUM CHLORIDE 0.9 % IV SOLN: 10.0000 mL/h | Freq: Once | INTRAVENOUS | Status: DC

## 2022-09-26 MED ORDER — CEFAZOLIN SODIUM-DEXTROSE 2-4 GM/100ML-% IV SOLN
2.0000 g | INTRAVENOUS | Status: AC
  Administered 2022-09-26 (×2): 2 g via INTRAVENOUS
  Filled 2022-09-26: qty 100

## 2022-09-26 MED ORDER — PROPOFOL 10 MG/ML IV BOLUS
INTRAVENOUS | Status: DC | PRN
  Administered 2022-09-26: 90 mg via INTRAVENOUS

## 2022-09-26 MED ORDER — IODIXANOL 320 MG/ML IV SOLN
INTRAVENOUS | Status: DC | PRN
  Administered 2022-09-26: 100 mL via INTRA_ARTERIAL

## 2022-09-26 MED ORDER — TAMSULOSIN HCL 0.4 MG PO CAPS
0.4000 mg | ORAL_CAPSULE | Freq: Every day | ORAL | Status: DC
  Administered 2022-09-26 – 2022-10-05 (×10): 0.4 mg via ORAL
  Filled 2022-09-26 (×10): qty 1

## 2022-09-26 MED ORDER — CEFAZOLIN SODIUM 1 G IJ SOLR
INTRAMUSCULAR | Status: AC
  Filled 2022-09-26: qty 20

## 2022-09-26 MED ORDER — ROCURONIUM BROMIDE 10 MG/ML (PF) SYRINGE
PREFILLED_SYRINGE | INTRAVENOUS | Status: DC | PRN
  Administered 2022-09-26: 20 mg via INTRAVENOUS
  Administered 2022-09-26: 50 mg via INTRAVENOUS
  Administered 2022-09-26 (×2): 20 mg via INTRAVENOUS
  Administered 2022-09-26: 30 mg via INTRAVENOUS

## 2022-09-26 MED ORDER — LACTATED RINGERS IV SOLN: INTRAVENOUS | Status: DC

## 2022-09-26 MED ORDER — HEPARIN 6000 UNIT IRRIGATION SOLUTION
Status: DC | PRN
  Administered 2022-09-26: 1

## 2022-09-26 MED ORDER — MAGNESIUM SULFATE 2 GM/50ML IV SOLN
2.0000 g | Freq: Every day | INTRAVENOUS | Status: AC | PRN
  Administered 2022-09-26: 2 g via INTRAVENOUS
  Filled 2022-09-26: qty 50

## 2022-09-26 MED ORDER — DEXAMETHASONE SODIUM PHOSPHATE 10 MG/ML IJ SOLN
INTRAMUSCULAR | Status: DC | PRN
  Administered 2022-09-26: 4 mg via INTRAVENOUS

## 2022-09-26 MED ORDER — ONDANSETRON HCL 4 MG/2ML IJ SOLN
4.0000 mg | Freq: Four times a day (QID) | INTRAMUSCULAR | Status: DC | PRN
  Administered 2022-09-30: 4 mg via INTRAVENOUS
  Filled 2022-09-26: qty 2

## 2022-09-26 MED ORDER — PHENYLEPHRINE 80 MCG/ML (10ML) SYRINGE FOR IV PUSH (FOR BLOOD PRESSURE SUPPORT)
PREFILLED_SYRINGE | INTRAVENOUS | Status: DC | PRN
  Administered 2022-09-26 (×6): 80 ug via INTRAVENOUS

## 2022-09-26 MED ORDER — OXYCODONE HCL 5 MG PO TABS: 5.0000 mg | ORAL_TABLET | Freq: Once | ORAL | Status: DC | PRN

## 2022-09-26 MED ORDER — HEPARIN SODIUM (PORCINE) 1000 UNIT/ML IJ SOLN
INTRAMUSCULAR | Status: DC | PRN
  Administered 2022-09-26: 9000 [IU] via INTRAVENOUS
  Administered 2022-09-26: 3000 [IU] via INTRAVENOUS

## 2022-09-26 MED ORDER — OXYCODONE-ACETAMINOPHEN 5-325 MG PO TABS
1.0000 | ORAL_TABLET | ORAL | Status: DC | PRN
  Administered 2022-09-26 – 2022-09-28 (×3): 1 via ORAL
  Administered 2022-09-28: 2 via ORAL
  Filled 2022-09-26: qty 1
  Filled 2022-09-26: qty 2
  Filled 2022-09-26: qty 1
  Filled 2022-09-26: qty 2
  Filled 2022-09-26: qty 1

## 2022-09-26 MED ORDER — HEMOSTATIC AGENTS (NO CHARGE) OPTIME
TOPICAL | Status: DC | PRN
  Administered 2022-09-26: 1 via TOPICAL

## 2022-09-26 MED ORDER — LABETALOL HCL 5 MG/ML IV SOLN: 10.0000 mg | INTRAVENOUS | Status: DC | PRN

## 2022-09-26 MED ORDER — ROCURONIUM BROMIDE 10 MG/ML (PF) SYRINGE
PREFILLED_SYRINGE | INTRAVENOUS | Status: AC
  Filled 2022-09-26: qty 10

## 2022-09-26 MED ORDER — ALBUMIN HUMAN 5 % IV SOLN: INTRAVENOUS | Status: DC | PRN

## 2022-09-26 MED ORDER — CHLORHEXIDINE GLUCONATE CLOTH 2 % EX PADS
6.0000 | MEDICATED_PAD | Freq: Every day | CUTANEOUS | Status: DC
  Administered 2022-09-26 – 2022-09-28 (×3): 6 via TOPICAL

## 2022-09-26 MED ORDER — HEPARIN SODIUM (PORCINE) 5000 UNIT/ML IJ SOLN
5000.0000 [IU] | Freq: Three times a day (TID) | INTRAMUSCULAR | Status: DC
  Administered 2022-09-27 – 2022-10-06 (×27): 5000 [IU] via SUBCUTANEOUS
  Filled 2022-09-26 (×28): qty 1

## 2022-09-26 MED ORDER — ACETAMINOPHEN 325 MG RE SUPP: 325.0000 mg | RECTAL | Status: DC | PRN

## 2022-09-26 MED ORDER — ONDANSETRON HCL 4 MG/2ML IJ SOLN: 4.0000 mg | Freq: Once | INTRAMUSCULAR | Status: DC | PRN

## 2022-09-26 MED ORDER — ATORVASTATIN CALCIUM 40 MG PO TABS
40.0000 mg | ORAL_TABLET | Freq: Every day | ORAL | Status: DC
  Administered 2022-09-27 – 2022-10-06 (×10): 40 mg via ORAL
  Filled 2022-09-26 (×10): qty 1

## 2022-09-26 MED ORDER — SPIRONOLACTONE 25 MG PO TABS
25.0000 mg | ORAL_TABLET | Freq: Every day | ORAL | Status: DC
  Administered 2022-09-27 – 2022-10-06 (×10): 25 mg via ORAL
  Filled 2022-09-26 (×10): qty 1

## 2022-09-26 MED ORDER — PROTAMINE SULFATE 10 MG/ML IV SOLN
INTRAVENOUS | Status: DC | PRN
  Administered 2022-09-26: 10 mg via INTRAVENOUS
  Administered 2022-09-26: 40 mg via INTRAVENOUS

## 2022-09-26 MED ORDER — EPHEDRINE 5 MG/ML INJ
INTRAVENOUS | Status: AC
  Filled 2022-09-26: qty 5

## 2022-09-26 MED ORDER — ASPIRIN 81 MG PO TBEC
81.0000 mg | DELAYED_RELEASE_TABLET | Freq: Every day | ORAL | Status: DC
  Administered 2022-09-27 – 2022-10-06 (×10): 81 mg via ORAL
  Filled 2022-09-26 (×10): qty 1

## 2022-09-26 MED ORDER — SODIUM CHLORIDE 0.9 % IV SOLN: 500.0000 mL | Freq: Once | INTRAVENOUS | Status: DC | PRN

## 2022-09-26 MED ORDER — PHENYLEPHRINE 80 MCG/ML (10ML) SYRINGE FOR IV PUSH (FOR BLOOD PRESSURE SUPPORT)
PREFILLED_SYRINGE | INTRAVENOUS | Status: AC
  Filled 2022-09-26: qty 10

## 2022-09-26 MED ORDER — SODIUM CHLORIDE 0.9 % IV SOLN: INTRAVENOUS | Status: DC | PRN

## 2022-09-26 MED ORDER — SODIUM CHLORIDE 0.9 % IV SOLN: INTRAVENOUS | Status: DC

## 2022-09-26 MED ORDER — ONDANSETRON HCL 4 MG/2ML IJ SOLN
INTRAMUSCULAR | Status: AC
  Filled 2022-09-26: qty 2

## 2022-09-26 MED ORDER — EPHEDRINE SULFATE-NACL 50-0.9 MG/10ML-% IV SOSY
PREFILLED_SYRINGE | INTRAVENOUS | Status: DC | PRN
  Administered 2022-09-26 (×8): 5 mg via INTRAVENOUS

## 2022-09-26 MED ORDER — METOPROLOL TARTRATE 5 MG/5ML IV SOLN: 2.0000 mg | INTRAVENOUS | Status: DC | PRN

## 2022-09-26 MED ORDER — ONDANSETRON HCL 4 MG/2ML IJ SOLN
INTRAMUSCULAR | Status: DC | PRN
  Administered 2022-09-26: 4 mg via INTRAVENOUS

## 2022-09-26 MED ORDER — LACTATED RINGERS IV SOLN: INTRAVENOUS | Status: DC | PRN

## 2022-09-26 MED ORDER — PANTOPRAZOLE SODIUM 40 MG PO TBEC
40.0000 mg | DELAYED_RELEASE_TABLET | Freq: Every day | ORAL | Status: DC
  Administered 2022-09-27 – 2022-10-06 (×10): 40 mg via ORAL
  Filled 2022-09-26 (×10): qty 1

## 2022-09-26 MED ORDER — POTASSIUM CHLORIDE CRYS ER 20 MEQ PO TBCR
20.0000 meq | EXTENDED_RELEASE_TABLET | Freq: Every day | ORAL | Status: AC | PRN
  Administered 2022-09-27: 20 meq via ORAL
  Filled 2022-09-26: qty 1

## 2022-09-26 MED ORDER — VASOPRESSIN 20 UNIT/ML IV SOLN
INTRAVENOUS | Status: AC
  Filled 2022-09-26: qty 1

## 2022-09-26 MED ORDER — PHENYLEPHRINE HCL-NACL 20-0.9 MG/250ML-% IV SOLN
INTRAVENOUS | Status: DC | PRN
  Administered 2022-09-26: 80 ug/min via INTRAVENOUS
  Administered 2022-09-26: 20 ug/min via INTRAVENOUS
  Administered 2022-09-26: 35 ug/min via INTRAVENOUS
  Administered 2022-09-26: 10 ug/min via INTRAVENOUS

## 2022-09-26 MED ORDER — SUGAMMADEX SODIUM 200 MG/2ML IV SOLN
INTRAVENOUS | Status: DC | PRN
  Administered 2022-09-26: 200 mg via INTRAVENOUS

## 2022-09-26 MED ORDER — SODIUM CHLORIDE 0.9 % IV SOLN: INTRAVENOUS | Status: AC

## 2022-09-26 MED ORDER — FENTANYL CITRATE (PF) 250 MCG/5ML IJ SOLN
INTRAMUSCULAR | Status: AC
  Filled 2022-09-26: qty 5

## 2022-09-26 MED ORDER — LIDOCAINE 2% (20 MG/ML) 5 ML SYRINGE
INTRAMUSCULAR | Status: DC | PRN
  Administered 2022-09-26: 60 mg via INTRAVENOUS

## 2022-09-26 MED ORDER — 0.9 % SODIUM CHLORIDE (POUR BTL) OPTIME
TOPICAL | Status: DC | PRN
  Administered 2022-09-26: 1000 mL

## 2022-09-26 SURGICAL SUPPLY — 80 items
ADH SKN CLS APL DERMABOND .7 (GAUZE/BANDAGES/DRESSINGS) ×4
BAG COUNTER SPONGE SURGICOUNT (BAG) ×4 IMPLANT
BAG SNAP BAND KOVER 36X36 (MISCELLANEOUS) IMPLANT
BAG SPNG CNTER NS LX DISP (BAG) ×4
CANISTER SUCT 3000ML PPV (MISCELLANEOUS) ×4 IMPLANT
CATH BEACON 5 .035 65 KMP TIP (CATHETERS) IMPLANT
CATH BEACON 5.038 65CM KMP-01 (CATHETERS) ×4 IMPLANT
CATH OMNI FLUSH .035X70CM (CATHETERS) ×4 IMPLANT
DERMABOND ADVANCED .7 DNX12 (GAUZE/BANDAGES/DRESSINGS) ×4 IMPLANT
DEVICE CLOSURE PERCLS PRGLD 6F (VASCULAR PRODUCTS) ×16 IMPLANT
DEVICE TORQUE KENDALL .025-038 (MISCELLANEOUS) IMPLANT
DRSG AQUACEL AG ADV 3.5X 6 (GAUZE/BANDAGES/DRESSINGS) IMPLANT
DRSG TEGADERM 2-3/8X2-3/4 SM (GAUZE/BANDAGES/DRESSINGS) ×8 IMPLANT
ELECT CAUTERY BLADE 6.4 (BLADE) IMPLANT
ELECT REM PT RETURN 9FT ADLT (ELECTROSURGICAL) ×8
ELECTRODE REM PT RTRN 9FT ADLT (ELECTROSURGICAL) ×8 IMPLANT
EXCLDR TRNK ENDO 32X14.5X14 18 (Endovascular Graft) ×4 IMPLANT
EXCLUDER TNK END 32X14.5X14 18 (Endovascular Graft) IMPLANT
GAUZE SPONGE 2X2 8PLY STRL LF (GAUZE/BANDAGES/DRESSINGS) ×4 IMPLANT
GLOVE BIO SURGEON STRL SZ7.5 (GLOVE) ×4 IMPLANT
GLOVE BIOGEL PI IND STRL 8 (GLOVE) ×4 IMPLANT
GOWN STRL REUS W/ TWL LRG LVL3 (GOWN DISPOSABLE) ×12 IMPLANT
GOWN STRL REUS W/ TWL XL LVL3 (GOWN DISPOSABLE) ×4 IMPLANT
GOWN STRL REUS W/TWL LRG LVL3 (GOWN DISPOSABLE) ×12
GOWN STRL REUS W/TWL XL LVL3 (GOWN DISPOSABLE) ×4
GRAFT BALLN CATH 65CM (BALLOONS) ×4 IMPLANT
GUIDEWIRE ANGLED .035X150CM (WIRE) IMPLANT
HEMOSTAT SNOW SURGICEL 2X4 (HEMOSTASIS) IMPLANT
KIT BASIN OR (CUSTOM PROCEDURE TRAY) ×4 IMPLANT
KIT TURNOVER KIT B (KITS) ×4 IMPLANT
LEG CONTRALATERAL 20X11.5 (Vascular Products) ×4 IMPLANT
LEG CONTRALETERAL16X16X11.5 (Endovascular Graft) ×4 IMPLANT
LOOP VASCLR MAXI BLUE 18IN ST (MISCELLANEOUS) IMPLANT
LOOP VASCULAR MAXI 18 BLUE (MISCELLANEOUS) ×8
LOOP VASCULAR MINI 18 RED (MISCELLANEOUS) ×8
LOOPS VASCLR MAXI BLUE 18IN ST (MISCELLANEOUS) ×8 IMPLANT
NS IRRIG 1000ML POUR BTL (IV SOLUTION) ×4 IMPLANT
PACK ENDOVASCULAR (PACKS) ×4 IMPLANT
PAD ARMBOARD 7.5X6 YLW CONV (MISCELLANEOUS) ×8 IMPLANT
PATCH VASC XENOSURE 1CMX6CM (Vascular Products) ×4 IMPLANT
PATCH VASC XENOSURE 1X6 (Vascular Products) IMPLANT
PENCIL BUTTON HOLSTER BLD 10FT (ELECTRODE) IMPLANT
PERCLOSE PROGLIDE 6F (VASCULAR PRODUCTS) ×24
SET MICROPUNCTURE 5F STIFF (MISCELLANEOUS) ×4 IMPLANT
SHEATH BRITE TIP 8FR 23CM (SHEATH) ×4 IMPLANT
SHEATH DRYSEAL FLEX 12FR 33CM (SHEATH) IMPLANT
SHEATH DRYSEAL FLEX 18FR 33CM (SHEATH) IMPLANT
SHEATH PINNACLE 5F 10CM (SHEATH) IMPLANT
SHEATH PINNACLE 8F 10CM (SHEATH) ×4 IMPLANT
SHEATH PINNACLE 9F 10CM (SHEATH) IMPLANT
SPONGE INTESTINAL PEANUT (DISPOSABLE) IMPLANT
SPONGE T-LAP 18X18 ~~LOC~~+RFID (SPONGE) IMPLANT
STENT GRAFT CONTRALAT 16X11.5 (Endovascular Graft) IMPLANT
STENT GRAFT CONTRALAT 20X11.5 (Vascular Products) IMPLANT
STOPCOCK MORSE 400PSI 3WAY (MISCELLANEOUS) ×4 IMPLANT
SUT MNCRL AB 4-0 PS2 18 (SUTURE) ×4 IMPLANT
SUT PROLENE 5 0 C 1 24 (SUTURE) IMPLANT
SUT PROLENE 6 0 BV (SUTURE) IMPLANT
SUT SILK 2 0 (SUTURE) ×4
SUT SILK 2-0 18XBRD TIE 12 (SUTURE) IMPLANT
SUT SILK 3 0 (SUTURE) ×4
SUT SILK 3-0 18XBRD TIE 12 (SUTURE) IMPLANT
SUT SILK 4 0 (SUTURE) ×4
SUT SILK 4-0 18XBRD TIE 12 (SUTURE) IMPLANT
SUT VIC AB 2-0 CT1 27 (SUTURE) ×8
SUT VIC AB 2-0 CT1 TAPERPNT 27 (SUTURE) IMPLANT
SUT VIC AB 3-0 SH 27 (SUTURE) ×12
SUT VIC AB 3-0 SH 27X BRD (SUTURE) IMPLANT
SYR 20ML LL LF (SYRINGE) ×4 IMPLANT
SYR BULB IRRIG 60ML STRL (SYRINGE) IMPLANT
SYR MEDRAD MARK V 150ML (SYRINGE) IMPLANT
TOWEL GREEN STERILE (TOWEL DISPOSABLE) ×4 IMPLANT
TRAY FOLEY MTR SLVR 16FR STAT (SET/KITS/TRAYS/PACK) ×4 IMPLANT
TUBING HIGH PRESSURE 120CM (CONNECTOR) ×4 IMPLANT
TUBING INJECTOR 48 (MISCELLANEOUS) IMPLANT
VASCULAR TIE MAXI BLUE 18IN ST (MISCELLANEOUS) ×8
VASCULAR TIE MINI RED 18IN STL (MISCELLANEOUS) IMPLANT
WIRE AMPLATZ SS-J .035X180CM (WIRE) ×8 IMPLANT
WIRE BENTSON .035X145CM (WIRE) ×8 IMPLANT
WIRE LUNDERQUIST .035X180CM (WIRE) IMPLANT

## 2022-09-26 NOTE — Op Note (Signed)
Date: September 26, 2022  Preoperative diagnosis: 7.0 cm saccular infrarenal abdominal aortic aneurysm  Postoperative diagnosis: Same  Procedure: 1.  Ultrasound-guided access of bilateral common femoral arteries for delivery of endograft including percutaneous closure  2.  Repair of infrarenal abdominal aortic aneurysm with aortobiiliac stent graft (main body right 32 mm x 14 mm x 14 cm, 20 mm x 11.5 cm bellbottom limb into the right common iliac artery, and 16 mm x 11.5 cm bellbottom limb into the left common iliac artery) 3.  Open exposure of right common femoral artery (this was a redo exposure given previous femoral-popliteal bypass) 4.  Right common femoral endarterectomy with bovine pericardial patch angioplasty  Surgeon: Dr. Cephus Shelling, MD  Assistant: Aggie Moats, PA  Indications: 82 year old [name] seen by Dr. Arbie Cookey that has been followed for fusiform infrarenal abdominal aortic aneurysm with an associated saccular component.  Saccular aneurysm has gotten much larger over time the patient now presents for stent graft repair.  Risk benefits discussed.  An assistant was needed given the complexity of the case including for wire exchanges and for endarterectomy in the groin.  Findings: Ultrasound-guided percutaneous access of bilateral common femoral arteries for delivery of Perclose closure device.  Both common femoral arteries were very calcified with significant posterior plaque.  We had a very difficult time traversing the plaque in the right groin including deploying the closure devices.  An 41 French Gore dry seal was placed in the right common femoral artery and a 12 French Gore dry seal was placed in the left common femoral artery.  The aneurysm was treated with 32 mm x 14 mm x 14 cm main body right just below both renal arteries and the accessory right renal.  The left iliac limb was placed into the left common iliac after cannulating the gate with a 16 mm x 11.5 cm.  On the right  placed an iliac limb with a 20 mm x 1.5 cm into the common iliac artery.  Widely patent stent graft at completion.  No evidence of endoleak.  Perclose devices worked in the left groin and we shot a angiogram at the end that showed no dissection or stenosis in the common femoral.  The right femoral Perclose both failed requiring cutdown on the OR with extensive endarterectomy down to the profunda in the setting of a re-do groin exposure and bovine patch.  Brisk Doppler signals in both feet at completion.  Anesthesia: General  Details: Patient was taken to the operating room after informed consent was obtained.  Placed on the operative table in supine position.  General endotracheal anesthesia was induced.  The abdomen and both groins were then prepped and draped in standard sterile fashion.  Antibiotics were given and timeout performed.  Initially evaluated the right common femoral artery with ultrasound, it was patent and image was saved.  There was significant calcification here with large posterior plaque.  I tried to go as high as I could safely in the artery where I could still visualize it to access this with a micro access needle and placed a microwire and then I made a stab incision and dissected down with a hemostat and then placed a microsheath and then used a Bentson wire and an Troy [place] dilator.  I even had trouble getting an Troy [place] dilator to advance given significant calcification in the common femoral artery.  I then tried to deploy a Perclose devices at 10:00 and 2:00 in the right common femoral artery given  significant calcification and plaque in the iliacs really could not get backbleeding and had to use a Glidewire in order to get my closure devices up into the infrarenal aorta.  Finally I was able to get two Perclose devices in the right groin and placed a short 10 French sheath.  I then did the same in the left groin and evaluated this with ultrasound and again accessing it under ultrasound  guidance with a micro access needle and placed a microwire and micro sheath and then a Bentson wire and a Troy [place] dilato.  I then deployed Perclose devices at 10:00 and 2:00 in the left common femoral artery.  I then placed a 12 French sheath in the left groin after we exchanged for an Amplatz wire for more support.  I tried to do the same in the right groin but could not get the sheath to advance so I upsized to a double curve Lunday in the right groin for more support.  I then used a 12 [place] dilator and then the 18 Jamaica dilator and finally placed a 18 French sheath in the right groin up into the infrarenal aorta.  I then gave the patient 100 mL/kg IV heparin.  ACT's were checked to maintain greater than 250.  The main body device was deployed up the right side and the aortogram was obtained identifying both renal arteries and the right accessory renal and the main body was deployed just below the renals using a 32 mm x 14 mm x 14 cm.  I deployed down to the gate.  I then came from the left groin with a buddy wire and I was able to cannulate the gate with a KMP and a Bentson wire.  I then spun the pigtail catheter in the main body of the graft to confirm I was in the true lumen.  I then exchanged for stiff wire with the Amplatz and got a retrograde sheath shot on the left to identify the hypogastric and then placed a bell bottom of 16 mm x 11.5 cm on the left preserving the hypogastric.  I then did the same on the right pulling the sheath back and then deploying a 20 mm x 11.5 cm bellbottom into the right common iliac with preservation of hypogastric.  MOB balloon was then used to perform angioplasty of all proximal distal landing zones and overlapping components.  Final aortogram showed preserved renal arteries with filling of the stent graft exclusion of the aneurysm and no endoleak with preservation of hypogastrics.  I then removed the left groin 12 French sheath while holding manual pressure and tied down  the Perclose devices with good hemostasis.  I put a micro sheath in the left groin and shot an angiogram showing no dissection and a patent common femoral artery.  The wire was removed and we finished tying down the Perclose devices in the left groin.  I then attempted to do the same in the right groin but after closing both Perclose devices following removal of the 18 French sheath and we still had brisk arterial bleeding.  I tried to deploy an additional Perclose device that would not advance.  Ultimately elected to cut down on the right groin given he was starting to get a groin hematoma.  Transverse incision was made above the inguinal crease.  Dissected down through the subcutaneous tissue with Bovie cautery and the common femoral as well as distal external iliac artery were dissected out with Bovie cautery as well as  manual dissection.  I was using digital hemostasis to control the arteriotomy given I really could not get a sheath back in the artery even over a wire with the calcification.  Finally was able to get a vessel loop for proximal inflow control.  This was a redo groin given he had a previous femoropopliteal bypass and there was significant scar tissue in the groin.  I then was able to dissect down to the hood of the bypass as well as the main profunda branch and all these were controlled with vascular clamps.  I ended up opening the right common femoral artery after I got Henley on the distal external iliac for additional inflow control.  The right common femoral was opened with 11 blade scalpel Potts scissors and there was a large calcified plaque posteriorly that was endarterectomized down to the proximal profunda with the help of my assistant.  We had good inflow and we had excellent backbleeding from the profunda.  The old bypass graft appears occluded.  A bovine patch was brought in the field and this was sewn in place with 5-0 Prolene parachute technique and this was de-aired prior to  completion.  There was brisk Doppler signals in both feet.  I then irrigated out the groin gave protamine.  We had to place additional repair sutures in the patch.  Once we had good hemostasis I used Surgicel snow.  I closed the groin multiple layers of 2-0 Vicryl, 3-0 Vicryl, 4-0 Monocryl and Dermabond.  Signals in both feet at completion.  Taken to recovery in stable condition.  Complication: None  Condition: Stable  Cephus Shelling, MD [facility] Office: [phone]   Cephus Shelling

## 2022-09-26 NOTE — Anesthesia Procedure Notes (Signed)
Arterial Line Insertion Start/End6/26/2024 7:14 AM Performed by: Dorie Rank, CRNA, CRNA  Patient location: Pre-op. Preanesthetic checklist: patient identified, IV checked, site marked, risks and benefits discussed, surgical consent, monitors and equipment checked, pre-op evaluation, timeout performed and anesthesia consent Lidocaine 1% used for infiltration Left, radial was placed Catheter size: 20 G Hand hygiene performed  and maximum sterile barriers used   Attempts: 2 Procedure performed without using ultrasound guided technique. Following insertion, dressing applied and Biopatch. Post procedure assessment: normal and unchanged  Patient tolerated the procedure well with no immediate complications.

## 2022-09-26 NOTE — Anesthesia Procedure Notes (Signed)
Procedure Name: Intubation Date/Time: 09/26/2022 7:48 AM  Performed by: Mayer Camel, CRNAPre-anesthesia Checklist: Patient identified, Emergency Drugs available, Suction available and Patient being monitored Patient Re-evaluated:Patient Re-evaluated prior to induction Oxygen Delivery Method: Circle System Utilized Preoxygenation: Pre-oxygenation with 100% oxygen Induction Type: IV induction Ventilation: Mask ventilation without difficulty Laryngoscope Size: Glidescope and 4 Grade View: Grade II Tube type: Oral Tube size: 7.5 mm Number of attempts: 1 Airway Equipment and Method: Oral airway, Video-laryngoscopy and Stylet Placement Confirmation: ETT inserted through vocal cords under direct vision, positive ETCO2 and breath sounds checked- equal and bilateral Secured at: 22 cm Tube secured with: Tape Dental Injury: Teeth and Oropharynx as per pre-operative assessment  Comments: Elected glidescope intubation due to history of R vocal cord dysfunction. Intubation by Morrie Sheldon, SRNA

## 2022-09-26 NOTE — H&P (Signed)
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  The various methods of treatment have been discussed with the patient and family. After consideration of risks, benefits and other options for treatment, the patient has consented to  Procedure(s): ABDOMINAL AORTIC ENDOVASCULAR STENT GRAFT (N/A) as a surgical intervention.  The patient's history has been reviewed, patient examined, no change in status, stable for surgery.  I have reviewed the patient's chart and labs.  Questions were answered to the patient's satisfaction.    Stent graft for repair of AAA.  Risk and benefits discussed.   Cephus Shelling  Signed                                             [facility] [place]   Patient name: Troy [name]   MRN: [id number]        DOB: [birth year]        Sex: male   REASON FOR VISIT: Follow-up known abdominal aortic aneurysm with recent CT scan   HPI: Troy [name] is a 82 y.o. male here today for follow-up.  His main issue and concern continues to be related to urinary incontinence.  He has a remote history 20 years ago of seed implants for prostate cancer and is felt that this is related as a long-term complication.  He has no symptoms referable to his aneurysm.  He underwent CT angiogram of his abdomen and pelvis on 08/24/2022 and I am reviewing these images with him today.  He does have a remote history of coronary bypass grafting in 2000.       Past Medical History:  Diagnosis Date   AAA (abdominal aortic aneurysm) (HCC)      Followed by Dr. Tawanna Cooler Early   Arthritis     CAD (coronary artery disease)      a. CABG 2000.   Cancer [facility])      Prostate:  Radiation Tx   Carotid artery disease (HCC)     Chest pain      precordial. mild chronic .[name]... nonischemic   CHF (congestive heart failure) (HCC)     Chronic edema     Coronary  artery disease      a. Nuclear, January, 2008, no ischemia b. Cath 08/2012- 1/4 patent grafts, RCA CTO, no flow-limiting disease, medically managed   Diabetes mellitus without complication (HCC)     Dizziness 02/2011   Dyslipidemia     Fall     GERD (gastroesophageal reflux disease)      TAKES TUMS & ROLAIDS AS NEEDED   Heart murmur     Hx of CABG 2000   Hypertension     Myocardial infarction (HCC) 1999   Neck pain 02/2011   Pneumonia     Pulmonary hypertension (HCC)     Renal artery stenosis (HCC)      50-70%   S/P femoropopliteal bypass surgery      Dr. Arbie Cookey   Sinus bradycardia      Asymptomatic           Family History  Problem Relation Age of Onset   Deep vein thrombosis Father     Lung cancer Sister     Diabetes Sister     Heart disease  Sister          After age 64   Hyperlipidemia Sister     Hypertension Sister     Lung cancer Sister     Breast cancer Sister     Hypertension Mother     Diabetes Sister     Coronary artery disease Other          family hx of   Cancer Brother          "crab cancer"   Heart disease Brother     Heart attack Brother     Heart attack Daughter     Colon cancer Neg Hx     Stroke Neg Hx        SOCIAL HISTORY: Social History         Tobacco Use   Smoking status: Former      Packs/day: 2.00      Years: 70.00      Additional pack years: 0.00      Total pack years: 140.00      Types: Cigarettes      Quit date: 04/21/2018      Years since quitting: 4.3   Smokeless tobacco: Never  Substance Use Topics   Alcohol use: Yes      Alcohol/week: 0.0 - 2.0 standard drinks of alcohol      Comment: OCCASIONAL           Allergies  Allergen Reactions   Niacin Itching and Rash      Burning sensation   Contrast Media [Iodinated Contrast Media] Nausea And Vomiting      Per patient vocal cords thick with increased phlegm    Iodine-131 Nausea And Vomiting   Jardiance [Empagliflozin] Other (See Comments)      UTI   Myrbetriq  [Mirabegron] Other (See Comments)      UTI   Nsaids Other (See Comments)      Taking Coumadin            Current Outpatient Medications  Medication Sig Dispense Refill   acetaminophen (TYLENOL) 325 MG tablet Take 2 tablets (650 mg total) by mouth every 6 (six) hours as needed for mild pain, fever or headache (or Fever >/= 101). 12 tablet 0   albuterol (VENTOLIN HFA) 108 (90 Base) MCG/ACT inhaler Inhale 2 puffs into the lungs every 6 (six) hours as needed for wheezing or shortness of breath. 1 each 2   aspirin 81 MG EC tablet Take 1 tablet (81 mg total) by mouth daily with breakfast. 30 tablet 3   atorvastatin (LIPITOR) 40 MG tablet Take 1 tablet (40 mg total) by mouth daily. 90 tablet 1   Cholecalciferol (VITAMIN D3) 50 MCG (2000 UT) capsule Take 2,000 Units by mouth daily.       dutasteride (AVODART) 0.5 MG capsule Take 1 capsule (0.5 mg total) by mouth daily. 30 capsule 11   HYDROcodone-acetaminophen (NORCO) 10-325 MG tablet Take 1 tablet by mouth 2 (two) times daily as needed for moderate pain. 30 tablet 0   ipratropium (ATROVENT) 0.03 % nasal spray Place 2 sprays into both nostrils as needed for rhinitis.       isosorbide mononitrate (IMDUR) 60 MG 24 hr tablet Take 2 tablets (120 mg total) by mouth every evening. 180 tablet 3   latanoprost (XALATAN) 0.005 % ophthalmic solution Place 1 drop into both eyes at bedtime.       losartan (COZAAR) 25 MG tablet Take 1 tablet (25 mg total) by mouth daily. 30  tablet 4   metFORMIN (GLUCOPHAGE) 500 MG tablet Take 500 mg by mouth 2 (two) times daily.       nitroGLYCERIN (NITROSTAT) 0.4 MG SL tablet Place 1 tablet (0.4 mg total) under the tongue every 5 (five) minutes x 3 doses as needed for chest pain. 25 tablet 3   OXYGEN Inhale 3 L into the lungs at bedtime.       pantoprazole (PROTONIX) 40 MG tablet Take 1 tablet (40 mg total) by mouth daily. Take 30-60 min before first meal of the day       spironolactone (ALDACTONE) 25 MG tablet Take 1 tablet (25  mg total) by mouth daily. 90 tablet 2   tamsulosin (FLOMAX) 0.4 MG CAPS capsule Take 1 capsule (0.4 mg total) by mouth daily after supper. 90 capsule 3   torsemide (DEMADEX) 10 MG tablet Take 10-20 mg by mouth See admin instructions. Take 10 mg daily, may increase to 20 mg as needed for weight gain       warfarin (COUMADIN) 2 MG tablet Take 2 mg by mouth daily.        No current facility-administered medications for this visit.      REVIEW OF SYSTEMS:  [X]  denotes positive finding, [ ]  denotes negative finding Cardiac   Comments:  Chest pain or chest pressure:      Shortness of breath upon exertion:      Short of breath when lying flat:      Irregular heart rhythm:             Vascular      Pain in calf, thigh, or hip brought on by ambulation:      Pain in feet at night that wakes you up from your sleep:       Blood clot in your veins:      Leg swelling:                  PHYSICAL EXAM:    Vitals:    08/29/22 1408  BP: 130/70  Pulse: 64  Temp: (!) 97.3 F (36.3 C)  SpO2: 96%  Weight: 208 lb 9.6 oz (94.6 kg)  Height: 5\' 10"  (1.778 m)      GENERAL: The patient is a well-nourished male, in no acute distress. The vital signs are documented above. CARDIOVASCULAR: Abdomen soft with no palpable aneurysm PULMONARY: There is good air exchange  MUSCULOSKELETAL: There are no major deformities or cyanosis. NEUROLOGIC: No focal weakness or paresthesias are detected. SKIN: There are no ulcers or rashes noted. PSYCHIATRIC: The patient has a normal affect.   DATA:  CT scan reveals 2 components of his aneurysm.  He has a standard fusiform component in the mid infrarenal aorta with a maximal diameter of 4.8 cm.  He has a saccular component above the bifurcation.  This extends to the left and the maximal diameter of his true aorta and the saccular component is 7.8 cm.  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  He is a candidate for stent graft repair.  I described this procedure as a probable percutaneous bilateral access to the common femoral artery and expected 1 night hospitalization.  Reviewed the films with Dr. Clotilde Dieter in our practice who agrees with the plan for EVAR.  We will have  him see Dr. Marca Ancona for preop clearance prior to proceeding       Larina Earthly, MD [facility] Tel [phone]

## 2022-09-26 NOTE — Anesthesia Postprocedure Evaluation (Signed)
Anesthesia Post Note  Patient: Troy [name]  Procedure(s) Performed: ABDOMINAL AORTIC ENDOVASCULAR STENT GRAFT AORTOGRAM RIGHT COMMON FEMORAL ENDARTERECTOMY WITH BOVINE PATCH (Right: Groin) ULTRASOUND GUIDANCE FOR VASCULAR ACCESS (Bilateral: Groin)     Patient location during evaluation: PACU Anesthesia Type: General Level of consciousness: awake and alert Pain management: pain level controlled Vital Signs Assessment: post-procedure vital signs reviewed and stable Respiratory status: spontaneous breathing, nonlabored ventilation and respiratory function stable Cardiovascular status: stable and blood pressure returned to baseline Anesthetic complications: no   No notable events documented.  Last Vitals:  Vitals:   09/26/22 1315 09/26/22 1330  BP: (!) 104/54 (!) 124/49  Pulse: 93 89  Resp: 15 13  Temp:    SpO2: 93% 98%    Last Pain:  Vitals:   09/26/22 1315  TempSrc:   PainSc: 0-No pain                 Beryle Lathe

## 2022-09-26 NOTE — Transfer of Care (Signed)
Immediate Anesthesia Transfer of Care Note  Patient: Troy [name]  Procedure(s) Performed: ABDOMINAL AORTIC ENDOVASCULAR STENT GRAFT AORTOGRAM RIGHT COMMON FEMORAL ENDARTERECTOMY WITH BOVINE PATCH (Right: Groin) ULTRASOUND GUIDANCE FOR VASCULAR ACCESS (Bilateral: Groin)  Patient Location: PACU  Anesthesia Type:General  Level of Consciousness: awake, alert , and oriented  Airway & Oxygen Therapy: Patient Spontanous Breathing and Patient connected to face mask oxygen  Post-op Assessment: Report given to RN and Post -op Vital signs reviewed and stable  Post vital signs: Reviewed and stable  Last Vitals:  Vitals Value Taken Time  BP 106/59 09/26/22 1218  Temp    Pulse 101 09/26/22 1225  Resp 16 09/26/22 1225  SpO2 99 % 09/26/22 1225  Vitals shown include unvalidated device data.  Last Pain:  Vitals:   09/26/22 0627  TempSrc: Oral  PainSc: 0-No pain         Complications: No notable events documented.

## 2022-09-26 NOTE — Progress Notes (Signed)
Seen in the ICU after EVAR today requiring right femoral endarterectomy after Perclose failure.  Overall looks great.  Resting comfortably.  Both groins clean dry and intact without hematoma.  Left DP palpable.  Brisk right DP PT signals in the foot.  No lower extremity complaints.  Postop INR 2.2.  Will order 2 units FFP.  I have asked nursing to send another CBC.  Neo weaned to 10 from 35 in PACU.  Cephus Shelling, MD [facility] [place] Office: [phone]   Cephus Shelling

## 2022-09-27 ENCOUNTER — Other Ambulatory Visit (HOSPITAL_COMMUNITY): Payer: Self-pay

## 2022-09-27 ENCOUNTER — Encounter (HOSPITAL_COMMUNITY): Payer: Self-pay | Admitting: Vascular Surgery

## 2022-09-27 LAB — BPAM RBC
Blood Product Expiration Date: 202407042359
Blood Product Expiration Date: 202407142359
ISSUE DATE / TIME: 202406270842

## 2022-09-27 LAB — PREPARE FRESH FROZEN PLASMA: Unit division: 0

## 2022-09-27 LAB — CBC
HCT: 19.2 % — ABNORMAL LOW (ref 39.0–52.0)
Hemoglobin: 6.5 g/dL — CL (ref 13.0–17.0)
MCH: 30.8 pg (ref 26.0–34.0)
MCHC: 33.9 g/dL (ref 30.0–36.0)
MCV: 91 fL (ref 80.0–100.0)
Platelets: 95 10*3/uL — ABNORMAL LOW (ref 150–400)
RBC: 2.11 MIL/uL — ABNORMAL LOW (ref 4.22–5.81)
RDW: 16.5 % — ABNORMAL HIGH (ref 11.5–15.5)
WBC: 8.5 10*3/uL (ref 4.0–10.5)
nRBC: 0 % (ref 0.0–0.2)

## 2022-09-27 LAB — TYPE AND SCREEN
Unit division: 0
Unit division: 0

## 2022-09-27 LAB — POCT I-STAT, CHEM 8
BUN: 21 mg/dL (ref 8–23)
Calcium, Ion: 1.17 mmol/L (ref 1.15–1.40)
Chloride: 103 mmol/L (ref 98–111)
Creatinine, Ser: 0.9 mg/dL (ref 0.61–1.24)
Glucose, Bld: 158 mg/dL — ABNORMAL HIGH (ref 70–99)
HCT: 31 % — ABNORMAL LOW (ref 39.0–52.0)
Hemoglobin: 10.5 g/dL — ABNORMAL LOW (ref 13.0–17.0)
Potassium: 4.2 mmol/L (ref 3.5–5.1)
Sodium: 139 mmol/L (ref 135–145)
TCO2: 27 mmol/L (ref 22–32)

## 2022-09-27 LAB — GLUCOSE, CAPILLARY
Glucose-Capillary: 135 mg/dL — ABNORMAL HIGH (ref 70–99)
Glucose-Capillary: 149 mg/dL — ABNORMAL HIGH (ref 70–99)
Glucose-Capillary: 121 mg/dL — ABNORMAL HIGH (ref 70–99)
Glucose-Capillary: 105 mg/dL — ABNORMAL HIGH (ref 70–99)

## 2022-09-27 LAB — BPAM FFP
Blood Product Expiration Date: 202406262359
Blood Product Expiration Date: 202406262359
ISSUE DATE / TIME: 202406261629
Unit Type and Rh: 6200

## 2022-09-27 LAB — BASIC METABOLIC PANEL WITH GFR
Anion gap: 8 (ref 5–15)
BUN: 20 mg/dL (ref 8–23)
CO2: 22 mmol/L (ref 22–32)
Calcium: 7.5 mg/dL — ABNORMAL LOW (ref 8.9–10.3)
Chloride: 107 mmol/L (ref 98–111)
Creatinine, Ser: 1.1 mg/dL (ref 0.61–1.24)
GFR, Estimated: >60 mL/min (ref >60)
Glucose, Bld: 131 mg/dL — ABNORMAL HIGH (ref 70–99)
Potassium: 3.8 mmol/L (ref 3.5–5.1)
Sodium: 137 mmol/L (ref 135–145)

## 2022-09-27 LAB — POCT ACTIVATED CLOTTING TIME: Activated Clotting Time: 244 seconds

## 2022-09-27 LAB — HEMOGLOBIN AND HEMATOCRIT, BLOOD
HCT: 25 % — ABNORMAL LOW (ref 39.0–52.0)
Hemoglobin: 8.5 g/dL — ABNORMAL LOW (ref 13.0–17.0)

## 2022-09-27 LAB — PREPARE RBC (CROSSMATCH)

## 2022-09-27 MED ORDER — SODIUM CHLORIDE 0.9% IV SOLUTION: Freq: Once | INTRAVENOUS | Status: AC

## 2022-09-27 MED ORDER — ORAL CARE MOUTH RINSE: 15.0000 mL | OROMUCOSAL | Status: DC | PRN

## 2022-09-27 NOTE — Progress Notes (Addendum)
Progress Note    09/27/2022 7:58 AM 1 Day Post-Op  Subjective:  no complaints this morning.  Hgb 6.5 this morning.   Vitals:   09/27/22 0700 09/27/22 0705  BP: (!) 115/50 (!) 102/53  Pulse: 69 72  Resp: 12 (!) 24  Temp:  97.9 F (36.6 C)  SpO2: 96% 94%   Physical Exam: Lungs:  non labored Incisions:  R groin incision with ecchymosis but soft; L groin soft Extremities:  palpable L DP; brisk R DP and PT by doppler Abdomen:  soft, NT, ND Neurologic: A&O  CBC    Component Value Date/Time   WBC 8.5 09/27/2022 0542   RBC 2.11 (L) 09/27/2022 0542   HGB 6.5 (LL) 09/27/2022 0542   HGB 13.4 08/23/2021 0934   HCT 19.2 (L) 09/27/2022 0542   HCT 41.7 08/23/2021 0934   PLT 95 (L) 09/27/2022 0542   MCV 91.0 09/27/2022 0542   MCV 91 08/23/2021 0934   MCH 30.8 09/27/2022 0542   MCHC 33.9 09/27/2022 0542   RDW 16.5 (H) 09/27/2022 0542   RDW 14.8 08/23/2021 0934   LYMPHSABS 2.1 07/10/2022 2217   LYMPHSABS 1.5 08/23/2021 0934   MONOABS 0.7 07/10/2022 2217   EOSABS 0.2 07/10/2022 2217   EOSABS 0.2 08/23/2021 0934   BASOSABS 0.1 07/10/2022 2217   BASOSABS 0.1 08/23/2021 0934    BMET    Component Value Date/Time   NA 137 09/27/2022 0542   NA 142 03/29/2022 0843   K 3.8 09/27/2022 0542   CL 107 09/27/2022 0542   CO2 22 09/27/2022 0542   GLUCOSE 131 (H) 09/27/2022 0542   BUN 20 09/27/2022 0542   BUN 15 03/29/2022 0843   CREATININE 1.10 09/27/2022 0542   CALCIUM 7.5 (L) 09/27/2022 0542   GFRNONAA >60 09/27/2022 0542   GFRAA >60 12/14/2019 1514    INR    Component Value Date/Time   INR 2.2 (H) 09/26/2022 1228     Intake/Output Summary (Last 24 hours) at 09/27/2022 0758 Last data filed at 09/27/2022 0700 Gross per 24 hour  Intake 5503.08 ml  Output 2480 ml  Net 3023.08 ml     Assessment/Plan:  82 y.o. male is s/p EVAR with open R CFA repair 1 Day Post-Op   BLE well perfused with palpable L DP and brisk pedal signals by doppler in R foot Hgb 6.5 this morning;  no sign of active bleed; transfuse 2u pRBCs; check post transfusion H&H D/c foley D/c arterial line OOB with therapy Ok to transfer to 4e if post transfusion H&H is ok   Emilie Rutter, PA-C [facility] [phone] 09/27/2022 7:58 AM  I have seen and evaluated the patient. I agree with the PA note as documented above.  Postop day 1 status post stent graft repair of abdominal aortic aneurysm through bilateral common femoral access.  The right groin required common femoral cutdown with endarterectomy and bovine patch.  Stable overnight in the ICU.  Weaned off neo.  Overall looks good.  Both groin incisions are clean dry and intact.  He has some ecchymosis in the right groin.  Left DP palpable.  Brisk Doppler signals in the right foot in the DP PT.  No lower extremity symptoms.  Will DC Foley and DC A-line.  Hemoglobin today 6.5 so we will give 2 units packed red blood cells and check a posttransfusion H&H.  Hopefully transfer to floor this afternoon.  Cephus Shelling, MD [facility] [place] Office: [phone]

## 2022-09-27 NOTE — TOC Initial Note (Addendum)
Transition of Care [facility]) - Initial/Assessment Note    Patient Details  Name: Troy [name] MRN: [id number] Date of Birth: Jun 05, 1940  Transition of [facility]) CM/SW Contact:    Elliot Cousin, RN Phone Number: [phone] 09/27/2022, 8:36 AM  Clinical Narrative:  CM spoke to pt at bedside with dtr. 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 He has 4 hours per day from Mon-Friday. Pt reports he had [facility] in the past for [facility] RN and PT. Will continue to follow for dc needs.      Pt preoperatively arranged with [facility], but pt was satisfied with [facility] for [facility].               Expected Discharge Plan: Home w Home Health Services Barriers to Discharge: Continued Medical Work up   Patient Goals and CMS Choice Patient states their goals for this hospitalization and ongoing recovery are:: wants to get better CMS Medicare.gov Compare Post Acute Care list provided to:: Patient Choice offered to / list presented to : Patient      Expected Discharge Plan and Services   Discharge Planning Services: CM Consult Post Acute Care Choice: Home Health Living arrangements for the past 2 months: Single Family Home                                      Prior Living Arrangements/Services Living arrangements for the past 2 months: Single Family Home Lives with:: Self Patient language and need for interpreter reviewed:: Yes Do you feel safe going back to the place where you live?: Yes      Need for Family Participation in Patient Care: No (Comment) Care giver support system in place?: Yes (comment) Current home services: DME (rolling walker, cane, wheelchair, scooter, aide) Criminal Activity/Legal Involvement Pertinent to Current Situation/Hospitalization: No - Comment as needed  Activities of Daily Living Home Assistive Devices/Equipment: Walker (specify type) ADL Screening (condition at time of admission) Patient's cognitive ability adequate to safely  complete daily activities?: No Is the patient deaf or have difficulty hearing?: No Does the patient have difficulty seeing, even when wearing glasses/contacts?: No Does the patient have difficulty concentrating, remembering, or making decisions?: No Patient able to express need for assistance with ADLs?: Yes Does the patient have difficulty dressing or bathing?: No Independently performs ADLs?: Yes (appropriate for developmental age) Does the patient have difficulty walking or climbing stairs?: No Weakness of Legs: None Weakness of Arms/Hands: None  Permission Sought/Granted Permission sought to share information with : Case Manager, Family Supports, PCP Permission granted to share information with : Yes, Verbal Permission Granted  Share Information with NAME: Baldo Ash  Permission granted to share info w AGENCY: Home Health  Permission granted to share info w Relationship: daughter  Permission granted to share info w Contact Information: [phone]  Emotional Assessment Appearance:: Appears stated age Attitude/Demeanor/Rapport: Engaged Affect (typically observed): Accepting Orientation: : Oriented to Self, Oriented to Place, Oriented to  Time, Oriented to Situation   Psych Involvement: No (comment)  Admission diagnosis:  AAA (abdominal aortic aneurysm) (HCC) [I71.40] Patient Active Problem List   Diagnosis Date Noted   AAA (abdominal aortic aneurysm) (HCC) 09/26/2022   Urinary incontinence 06/14/2022   Debility 05/07/2022   Orchitis and epididymitis 05/06/2022   Hematuria 01/03/2022   Gross hematuria 01/02/2022   Acute urinary obstruction 01/02/2022   Epididymoorchitis 11/28/2021  Chronic heart failure with preserved ejection fraction (HFpEF) (HCC) 11/28/2021   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/06/2021   Upper airway cough syndrome 02/04/2021   Nocturnal hypoxemia 06/27/2020   DOE (dyspnea on exertion) 06/07/2020   Rt Sided Community acquired pneumonia 03/26/2020    Microcytic anemia 03/26/2020   --Severe pulmonary HTN, 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 [place] [address] Phone: [phone] Fax: [phone]  [facility] [address], [place] - [address] [place] [address] [place] [address] Phone: [phone] Fax: [phone]  [facility] - [place], [place] - [address] [place] [address] Phone: [phone] Fax: [phone]     Social Determinants of Health (SDOH) Social History: SDOH Screenings   Food Insecurity: No Food Insecurity (09/26/2022)  Housing: Low Risk  (09/26/2022)  Transportation Needs: No Transportation Needs (09/26/2022)  Utilities: Not At Risk (09/26/2022)  Tobacco Use: Medium Risk (09/26/2022)   SDOH Interventions:     Readmission Risk Interventions    05/07/2022   11:28 AM 11/07/2021   12:48 PM  Readmission Risk Prevention Plan  Transportation Screening Complete Complete  Home Care Screening   Complete  Medication Review (RN CM)  Complete  Medication Review Oceanographer) Complete   HRI or Home Care Consult Complete   SW Recovery Care/Counseling Consult Complete   Palliative Care Screening Not Applicable   Skilled Nursing Facility Complete

## 2022-09-27 NOTE — Evaluation (Signed)
Physical Therapy Evaluation Patient Details Name: Troy [name] MRN: [id number] DOB: [birth year] Today's Date: 09/27/2022  History of Present Illness  82 yo male admitted 6/26 for AAA repair s/p EVAR with open Rt CFA repair. PMhx: HLD, HTN, T2DM, Afib, anemia, CAD s/p CABG, prostate CA  Clinical Impression  Pt pleasant, lives alone since wife passed a few months ago, walks with RW and has PCA 20hrs per week. 2 kids in the area that work and pt unsure if they can provide initial intermittent assist. PT with decreased transfers, gait and function who will benefit from acute therapy to maximize mobility, safety and independence.   HR 69 97% on RA       Recommendations for follow up therapy are one component of a multi-disciplinary discharge planning process, led by the attending physician.  Recommendations may be updated based on patient status, additional functional criteria and insurance authorization.  Follow Up Recommendations       Assistance Recommended at Discharge Intermittent Supervision/Assistance  Patient can return home with the following  A little help with bathing/dressing/bathroom;Assistance with cooking/housework;Assist for transportation    Equipment Recommendations None recommended by PT  Recommendations for Other Services       Functional Status Assessment Patient has had a recent decline in their functional status and demonstrates the ability to make significant improvements in function in a reasonable and predictable amount of time.     Precautions / Restrictions Precautions Precautions: Fall;Other (comment) Precaution Comments: incontinent      Mobility  Bed Mobility               General bed mobility comments: in chair on arrival and end of session    Transfers Overall transfer level: Needs assistance   Transfers: Sit to/from Stand Sit to Stand: Min guard           General transfer comment: guarding for lines, pt able to stand without  physical assist    Ambulation/Gait Ambulation/Gait assistance: Min guard Gait Distance (Feet): 100 Feet Assistive device: Rolling walker (2 wheels) Gait Pattern/deviations: Step-through pattern, Decreased stride length, Trunk flexed   Gait velocity interpretation: 1.31 - 2.62 ft/sec, indicative of limited community ambulator   General Gait Details: cues for direction, limited by pain  Stairs            Wheelchair Mobility    Modified Rankin (Stroke Patients Only)       Balance Overall balance assessment: Needs assistance   Sitting balance-Leahy Scale: Fair     Standing balance support: Bilateral upper extremity supported, During functional activity, Reliant on assistive device for balance Standing balance-Leahy Scale: Poor Standing balance comment: RW in standing                             Pertinent Vitals/Pain Pain Assessment Pain Assessment: 0-10 Pain Score: 4  Pain Location: abdomen Pain Descriptors / Indicators: Aching Pain Intervention(s): Limited activity within patient's tolerance, Monitored during session, Repositioned    Home Living Family/patient expects to be discharged to:: Private residence Living Arrangements: Alone Available Help at Discharge: Family;Available PRN/intermittently;Personal care attendant Type of Home: House Home Access: Ramped entrance       Home Layout: One level Home Equipment: Agricultural consultant (2 wheels);Wheelchair - manual;Shower seat - built in;Shower seat;Grab bars - tub/shower;BSC/3in1 Additional Comments: PCA 4 hrs, 5 days a week    Prior Function Prior Level of Function : Needs assist  Mobility Comments: walks with RW, power scooter in grocery store ADLs Comments: PRN assist for ADL's. PCA for IADL's.     Hand Dominance        Extremity/Trunk Assessment   Upper Extremity Assessment Upper Extremity Assessment: Generalized weakness    Lower Extremity Assessment Lower Extremity  Assessment: Generalized weakness    Cervical / Trunk Assessment Cervical / Trunk Assessment: Kyphotic  Communication   Communication: No difficulties  Cognition Arousal/Alertness: Awake/alert Behavior During Therapy: WFL for tasks assessed/performed Overall Cognitive Status: Within Functional Limits for tasks assessed                                          General Comments      Exercises     Assessment/Plan    PT Assessment Patient needs continued PT services  PT Problem List Decreased activity tolerance;Decreased balance;Decreased knowledge of use of DME       PT Treatment Interventions DME instruction;Gait training;Functional mobility training;Therapeutic activities;Patient/family education;Therapeutic exercise    PT Goals (Current goals can be found in the Care Plan section)  Acute Rehab PT Goals Patient Stated Goal: return home PT Goal Formulation: With patient Time For Goal Achievement: 10/11/22 Potential to Achieve Goals: Good    Frequency Min 1X/week     Co-evaluation               AM-PAC PT "6 Clicks" Mobility  Outcome Measure Help needed turning from your back to your side while in a flat bed without using bedrails?: A Little Help needed moving from lying on your back to sitting on the side of a flat bed without using bedrails?: A Little Help needed moving to and from a bed to a chair (including a wheelchair)?: A Little Help needed standing up from a chair using your arms (e.g., wheelchair or bedside chair)?: A Little Help needed to walk in hospital room?: A Little Help needed climbing 3-5 steps with a railing? : A Lot 6 Click Score: 17    End of Session Equipment Utilized During Treatment: Gait belt Activity Tolerance: Patient tolerated treatment well Patient left: in chair;with call bell/phone within reach;with nursing/sitter in room Nurse Communication: Mobility status PT Visit Diagnosis: Other abnormalities of gait and  mobility (R26.89);Difficulty in walking, not elsewhere classified (R26.2)    Time: 1610-9604 PT Time Calculation (min) (ACUTE ONLY): 19 min   Charges:   PT Evaluation $PT Eval Moderate Complexity: 1 Mod          Camilla Skeen P, PT Acute Rehabilitation Services Office: [phone]   Troy [name] 09/27/2022, 12:57 PM

## 2022-09-27 NOTE — TOC Benefit Eligibility Note (Signed)
Pharmacy Patient Advocate Encounter  Insurance verification completed.    The patient is insured through [facility] Medicare Part D  Ran test claim for Eliquis 5 mg and the current 30 day co-pay is $45.00.  Ran test claim for Xarelto 20 mg and the current 30 day co-pay is $45.00.   This test claim was processed through [facility]- copay amounts may vary at other pharmacies due to pharmacy/plan contracts, or as the patient moves through the different stages of their insurance plan.    Roland Earl, CPHT Pharmacy Patient Advocate Specialist [facility] Patient Advocate Team Direct Number: [phone]  Fax: [phone]

## 2022-09-28 ENCOUNTER — Encounter (HOSPITAL_COMMUNITY): Payer: Self-pay | Admitting: Vascular Surgery

## 2022-09-28 LAB — BPAM RBC
Blood Product Expiration Date: 202407142359
Unit Type and Rh: 6200
Unit Type and Rh: 6200

## 2022-09-28 LAB — BASIC METABOLIC PANEL
Anion gap: 10 (ref 5–15)
BUN: 13 mg/dL (ref 8–23)
CO2: 20 mmol/L — ABNORMAL LOW (ref 22–32)
Calcium: 7.9 mg/dL — ABNORMAL LOW (ref 8.9–10.3)
Chloride: 105 mmol/L (ref 98–111)
Creatinine, Ser: 1.03 mg/dL (ref 0.61–1.24)
GFR, Estimated: >60 mL/min (ref >60)
Glucose, Bld: 168 mg/dL — ABNORMAL HIGH (ref 70–99)
Potassium: 4.4 mmol/L (ref 3.5–5.1)
Sodium: 135 mmol/L (ref 135–145)

## 2022-09-28 LAB — CBC
HCT: 26.1 % — ABNORMAL LOW (ref 39.0–52.0)
Hemoglobin: 8.7 g/dL — ABNORMAL LOW (ref 13.0–17.0)
MCH: 30.1 pg (ref 26.0–34.0)
MCHC: 33.3 g/dL (ref 30.0–36.0)
MCV: 90.3 fL (ref 80.0–100.0)
Platelets: 100 10*3/uL — ABNORMAL LOW (ref 150–400)
RBC: 2.89 MIL/uL — ABNORMAL LOW (ref 4.22–5.81)
RDW: 16.9 % — ABNORMAL HIGH (ref 11.5–15.5)
WBC: 8.4 10*3/uL (ref 4.0–10.5)
nRBC: 0.2 % (ref 0.0–0.2)

## 2022-09-28 LAB — GLUCOSE, CAPILLARY
Glucose-Capillary: 117 mg/dL — ABNORMAL HIGH (ref 70–99)
Glucose-Capillary: 126 mg/dL — ABNORMAL HIGH (ref 70–99)
Glucose-Capillary: 145 mg/dL — ABNORMAL HIGH (ref 70–99)
Glucose-Capillary: 138 mg/dL — ABNORMAL HIGH (ref 70–99)

## 2022-09-28 LAB — TYPE AND SCREEN
Unit division: 0
Unit division: 0

## 2022-09-28 LAB — MAGNESIUM: Magnesium: 1.8 mg/dL (ref 1.7–2.4)

## 2022-09-28 NOTE — Progress Notes (Signed)
Pt arrived from [facility] in wheelchair.  Oriented and pleasant.  Minimal assist to recliner.  Tele applied and CCMD notified.  VSS and charted.  Call bell cell phone and drink within reach.  Will cont plan of care.

## 2022-09-28 NOTE — Progress Notes (Addendum)
  Progress Note    09/28/2022 7:52 AM 2 Days Post-Op  Subjective:  Did not feel great walking yesterday due to R groin pain.  Would prefer to work with therapy teams another day   Vitals:   09/28/22 0500 09/28/22 0600  BP: (!) 147/62 (!) 151/60  Pulse: 72 81  Resp: 15 (!) 24  Temp:    SpO2: 97% 96%   Physical Exam: Lungs:  non labored Incisions:  R groin incision c/d/I; soft L groin Extremities:  palpable L DP; brisk R DP and PT Abdomen:  soft Neurologic: A&O  CBC    Component Value Date/Time   WBC 8.5 09/27/2022 0542   RBC 2.11 (L) 09/27/2022 0542   HGB 8.5 (L) 09/27/2022 1406   HGB 13.4 08/23/2021 0934   HCT 25.0 (L) 09/27/2022 1406   HCT 41.7 08/23/2021 0934   PLT 95 (L) 09/27/2022 0542   MCV 91.0 09/27/2022 0542   MCV 91 08/23/2021 0934   MCH 30.8 09/27/2022 0542   MCHC 33.9 09/27/2022 0542   RDW 16.5 (H) 09/27/2022 0542   RDW 14.8 08/23/2021 0934   LYMPHSABS 2.1 07/10/2022 2217   LYMPHSABS 1.5 08/23/2021 0934   MONOABS 0.7 07/10/2022 2217   EOSABS 0.2 07/10/2022 2217   EOSABS 0.2 08/23/2021 0934   BASOSABS 0.1 07/10/2022 2217   BASOSABS 0.1 08/23/2021 0934    BMET    Component Value Date/Time   NA 137 09/27/2022 0542   NA 142 03/29/2022 0843   K 3.8 09/27/2022 0542   CL 107 09/27/2022 0542   CO2 22 09/27/2022 0542   GLUCOSE 131 (H) 09/27/2022 0542   BUN 20 09/27/2022 0542   BUN 15 03/29/2022 0843   CREATININE 1.10 09/27/2022 0542   CALCIUM 7.5 (L) 09/27/2022 0542   GFRNONAA >60 09/27/2022 0542   GFRAA >60 12/14/2019 1514    INR    Component Value Date/Time   INR 2.2 (H) 09/26/2022 1228     Intake/Output Summary (Last 24 hours) at 09/28/2022 2956 Last data filed at 09/28/2022 0600 Gross per 24 hour  Intake 2621.56 ml  Output 2050 ml  Net 571.56 ml     Assessment/Plan:  82 y.o. male is s/p EVAR with repair of R CFA 2 Days Post-Op   BLE well perfused on exam Hgb responded appropriately after transfusion; CBC this morning  pending OOB with therapy again today Ok to transfer to 4e Home probably tomorrow   Emilie Rutter, PA-C [facility] [phone] 09/28/2022 7:52 AM  I have seen and evaluated the patient. I agree with the PA note as documented above.  Postop day 2 status post aortobiiliac stent graft for repair of abdominal aortic aneurysm requiring right common femoral endarterectomy with patch angioplasty.  Significant ecchymosis in the right groin but this is from Perclose failure with groin hematoma that required cutdown.  This will all improve.  His incision looks good.  Groin is soft.  Brisk Doppler signals in the right foot.  Left groin looks fine.  Left DP palpable.  States he was little dizzy yesterday walking with therapy and has a lot of scrotal swelling with pain.  Will transfer to the floor today.  He is on his home spironolactone for diuresis.  Aspirin statin for risk reduction.  Hopefully home tomorrow.  Discussed we will follow-up in 1 month with CT scan.  Cephus Shelling, MD [facility] [place] Office: [phone]

## 2022-09-28 NOTE — Evaluation (Signed)
Occupational Therapy Evaluation Patient Details Name: Troy [name] MRN: [id number] DOB: 11-28-1940 Today's Date: 09/28/2022   History of Present Illness 82 yo male admitted 6/26 for AAA repair s/p EVAR with open Rt CFA repair. PMhx: HLD, HTN, T2DM, Afib, anemia, CAD s/p CABG, prostate CA   Clinical Impression   Patient admitted for the diagnosis above.  PTA he lives alone at home, and has 4 hour/day PCA assist for ADL, iADL and groceries.  In addition, his sn can assist before and after work.  Currently he is needing up to Min A for basic mobility, and up to Mod A for lower body ADL from a sit to stand level.  OT will follow in the acute setting to address deficits, and HH OT can be considered for post acute rehab.        Recommendations for follow up therapy are one component of a multi-disciplinary discharge planning process, led by the attending physician.  Recommendations may be updated based on patient status, additional functional criteria and insurance authorization.   Assistance Recommended at Discharge Intermittent Supervision/Assistance  Patient can return home with the following Help with stairs or ramp for entrance;Assist for transportation;Assistance with cooking/housework;A little help with walking and/or transfers;A little help with bathing/dressing/bathroom    Functional Status Assessment  Patient has had a recent decline in their functional status and demonstrates the ability to make significant improvements in function in a reasonable and predictable amount of time.  Equipment Recommendations  None recommended by OT    Recommendations for Other Services       Precautions / Restrictions Precautions Precautions: Fall;Other (comment) Precaution Comments: incontinent Restrictions Weight Bearing Restrictions: No      Mobility Bed Mobility Overal bed mobility: Needs Assistance Bed Mobility: Supine to Sit     Supine to sit: Min assist       Patient Response:  Cooperative  Transfers Overall transfer level: Needs assistance   Transfers: Sit to/from Stand, Bed to chair/wheelchair/BSC Sit to Stand: Min guard     Step pivot transfers: Supervision            Balance Overall balance assessment: Needs assistance   Sitting balance-Leahy Scale: Fair     Standing balance support: Bilateral upper extremity supported, During functional activity, Reliant on assistive device for balance Standing balance-Leahy Scale: Poor                             ADL either performed or assessed with clinical judgement   ADL       Grooming: Wash/dry hands;Wash/dry face;Supervision/safety;Standing           Upper Body Dressing : Set up;Sitting   Lower Body Dressing: Moderate assistance;Sit to/from stand   Toilet Transfer: Min guard;Rolling walker (2 wheels);Regular Toilet;Ambulation                   Vision Patient Visual Report: No change from baseline       Perception     Praxis      Pertinent Vitals/Pain Pain Assessment Pain Assessment: Faces Faces Pain Scale: Hurts even more Pain Location: Rt groin Pain Descriptors / Indicators: Tender Pain Intervention(s): Monitored during session     Hand Dominance Right   Extremity/Trunk Assessment Upper Extremity Assessment Upper Extremity Assessment: Overall WFL for tasks assessed   Lower Extremity Assessment Lower Extremity Assessment: Defer to PT evaluation   Cervical / Trunk Assessment Cervical / Trunk Assessment: Kyphotic  Communication Communication Communication: No difficulties   Cognition Arousal/Alertness: Awake/alert Behavior During Therapy: WFL for tasks assessed/performed Overall Cognitive Status: Within Functional Limits for tasks assessed                                       General Comments   VSS on RA    Exercises     Shoulder Instructions      Home Living Family/patient expects to be discharged to:: Private  residence Living Arrangements: Alone Available Help at Discharge: Family;Available PRN/intermittently;Personal care attendant Type of Home: House Home Access: Ramped entrance     Home Layout: One level     Bathroom Shower/Tub: Chief Strategy Officer: Handicapped height Bathroom Accessibility: Yes   Home Equipment: Agricultural consultant (2 wheels);Wheelchair - manual;Shower seat - built in;Shower seat;Grab bars - tub/shower;BSC/3in1          Prior Functioning/Environment Prior Level of Function : Needs assist             Mobility Comments: walks with RW, power scooter in grocery store ADLs Comments: PRN assist for ADL's. PCA for IADL's.        OT Problem List: Decreased strength;Decreased activity tolerance;Impaired balance (sitting and/or standing);Pain      OT Treatment/Interventions: Self-care/ADL training;Therapeutic activities;Patient/family education;Balance training;DME and/or AE instruction;Energy conservation    OT Goals(Current goals can be found in the care plan section) Acute Rehab OT Goals Patient Stated Goal: Return home OT Goal Formulation: With patient Time For Goal Achievement: 10/12/22 Potential to Achieve Goals: Good ADL Goals Pt Will Perform Grooming: with modified independence;standing Pt Will Perform Lower Body Dressing: with modified independence;sit to/from stand Pt Will Transfer to Toilet: with modified independence;regular height toilet;ambulating  OT Frequency: Min 2X/week    Co-evaluation              AM-PAC OT "6 Clicks" Daily Activity     Outcome Measure Help from another person eating meals?: None Help from another person taking care of personal grooming?: A Little Help from another person toileting, which includes using toliet, bedpan, or urinal?: A Little Help from another person bathing (including washing, rinsing, drying)?: A Lot Help from another person to put on and taking off regular upper body clothing?:  None Help from another person to put on and taking off regular lower body clothing?: A Lot 6 Click Score: 18   End of Session Equipment Utilized During Treatment: Gait belt;Rolling walker (2 wheels) Nurse Communication: Mobility status  Activity Tolerance: Patient tolerated treatment well Patient left: in chair;with call bell/phone within reach  OT Visit Diagnosis: Unsteadiness on feet (R26.81);Muscle weakness (generalized) (M62.81)                Time: 1350-1410 OT Time Calculation (min): 20 min Charges:  OT General Charges $OT Visit: 1 Visit OT Evaluation $OT Eval Moderate Complexity: 1 Mod  09/28/2022  RP, OTR/L  Acute Rehabilitation Services  Office:  [phone]   Suzanna Obey 09/28/2022, 2:46 PM

## 2022-09-28 NOTE — Progress Notes (Signed)
Physical Therapy Treatment Patient Details Name: Troy [name] MRN: [id number] DOB: Aug 16, 1940 Today's Date: 09/28/2022   History of Present Illness 82 yo male admitted 6/26 for AAA repair s/p EVAR with open Rt CFA repair. PMhx: HLD, HTN, T2DM, Afib, anemia, CAD s/p CABG, prostate CA    PT Comments    Pt pleasant stating Rt groin and scrotal pain. Pt reports son can provide assist at home and plans to stay at night for a week or so with PCA assist during the day. Pt with assist to transition to bed and cues for sequence and safety. PT able to increase gait distance today and encouraged continued mobility with nursing staff.   97% on RA HR 91 BP 115/87   Recommendations for follow up therapy are one component of a multi-disciplinary discharge planning process, led by the attending physician.  Recommendations may be updated based on patient status, additional functional criteria and insurance authorization.  Follow Up Recommendations       Assistance Recommended at Discharge Intermittent Supervision/Assistance  Patient can return home with the following A little help with bathing/dressing/bathroom;Assistance with cooking/housework;Assist for transportation   Equipment Recommendations  None recommended by PT    Recommendations for Other Services       Precautions / Restrictions Precautions Precautions: Fall;Other (comment) Precaution Comments: incontinent     Mobility  Bed Mobility Overal bed mobility: Needs Assistance Bed Mobility: Sit to Supine       Sit to supine: Min assist   General bed mobility comments: min assist to lift legs to surface    Transfers Overall transfer level: Needs assistance   Transfers: Sit to/from Stand Sit to Stand: Min guard           General transfer comment: guarding for lines, pt able to stand without physical assist with increased time    Ambulation/Gait Ambulation/Gait assistance: Min guard Gait Distance (Feet): 200  Feet Assistive device: Rolling walker (2 wheels) Gait Pattern/deviations: Step-through pattern, Decreased stride length, Trunk flexed   Gait velocity interpretation: 1.31 - 2.62 ft/sec, indicative of limited community ambulator   General Gait Details: cues for direction and proximity to RW, limited by UB fatigue from RW use and groin pain   Stairs             Wheelchair Mobility    Modified Rankin (Stroke Patients Only)       Balance Overall balance assessment: Needs assistance   Sitting balance-Leahy Scale: Fair     Standing balance support: Bilateral upper extremity supported, During functional activity, Reliant on assistive device for balance   Standing balance comment: RW in standing                            Cognition Arousal/Alertness: Awake/alert Behavior During Therapy: WFL for tasks assessed/performed Overall Cognitive Status: Within Functional Limits for tasks assessed                                          Exercises      General Comments        Pertinent Vitals/Pain Pain Assessment Pain Score: 4  Pain Location: Rt groin Pain Descriptors / Indicators: Aching Pain Intervention(s): Limited activity within patient's tolerance, Monitored during session, Repositioned, Premedicated before session    Home Living  Prior Function            PT Goals (current goals can now be found in the care plan section) Progress towards PT goals: Progressing toward goals    Frequency    Min 1X/week      PT Plan Current plan remains appropriate    Co-evaluation              AM-PAC PT "6 Clicks" Mobility   Outcome Measure  Help needed turning from your back to your side while in a flat bed without using bedrails?: A Little Help needed moving from lying on your back to sitting on the side of a flat bed without using bedrails?: A Little Help needed moving to and from a bed to a  chair (including a wheelchair)?: A Little Help needed standing up from a chair using your arms (e.g., wheelchair or bedside chair)?: A Little Help needed to walk in hospital room?: A Little Help needed climbing 3-5 steps with a railing? : A Lot 6 Click Score: 17    End of Session Equipment Utilized During Treatment: Gait belt Activity Tolerance: Patient tolerated treatment well Patient left: with call bell/phone within reach;in bed Nurse Communication: Mobility status PT Visit Diagnosis: Other abnormalities of gait and mobility (R26.89);Difficulty in walking, not elsewhere classified (R26.2)     Time: 3086-5784 PT Time Calculation (min) (ACUTE ONLY): 23 min  Charges:  $Gait Training: 8-22 mins $Therapeutic Activity: 8-22 mins                     Merryl Hacker, PT Acute Rehabilitation Services Office: [phone]    Cristine Polio 09/28/2022, 9:47 AM

## 2022-09-28 NOTE — TOC Initial Note (Signed)
Transition of Care (TOC) - Initial/Assessment Note  Donn Pierini RN, BSN Transitions of Care Unit 4E- RN Case Manager See Treatment Team for direct phone #   Patient Details  Name: Troy [name] MRN: [id number] Date of Birth: 1941/01/13  Transition of Care [facility]) CM/SW Contact:    Darrold Span, RN Phone Number: 09/28/2022, 4:28 PM  Clinical Narrative:                 Cm spoke with pt at bedside for HHPT needs.  Per pt he is worried about returning home- as he lives at home alone- he does state that he has an Engineer, production through [place] that is approved through the [place] that comes 4hrs/day. He also voiced that his son can come stay at night- but he will be at home alone for about 8hrs with no one to assist him. - pt asking about going to rehab PT/OT recommending- HH- and order placed- explained to pt that at this time with current recommendations- insurance is not likely at approve a rehab stay.   Pt wants to see how he feels tomorrow and how he does with therapy- he is hoping that he does better as he would like to return home if he can safely stay by himself for extending timeframe.   In further discussion pt voiced that he has used [facility] for [facility] services and if able to return home then he would like to use them again,   CM/TOC will f/u with pt over weekend to see how he feels about transition home, and make [facility] referral if plan remains to return home w/ [facility]  Expected Discharge Plan: Home w Home Health Services Barriers to Discharge: Continued Medical Work up   Patient Goals and CMS Choice Patient states their goals for this hospitalization and ongoing recovery are:: wants to get better CMS Medicare.gov Compare Post Acute Care list provided to:: Patient Choice offered to / list presented to : Patient      Expected Discharge Plan and Services   Discharge Planning Services: CM Consult Post Acute Care Choice: Home Health Living arrangements for the past 2 months: Single Family  Home                           HH Arranged: PT HH Agency: [facility])        Prior Living Arrangements/Services Living arrangements for the past 2 months: Single Family Home Lives with:: Self Patient language and need for interpreter reviewed:: Yes Do you feel safe going back to the place where you live?: Yes      Need for Family Participation in Patient Care: No (Comment) Care giver support system in place?: Yes (comment) Current home services: DME (rolling walker, cane, wheelchair, scooter, aide) Criminal Activity/Legal Involvement Pertinent to Current Situation/Hospitalization: No - Comment as needed  Activities of Daily Living Home Assistive Devices/Equipment: Dan Humphreys (specify type) ADL Screening (condition at time of admission) Patient's cognitive ability adequate to safely complete daily activities?: No Is the patient deaf or have difficulty hearing?: No Does the patient have difficulty seeing, even when wearing glasses/contacts?: No Does the patient have difficulty concentrating, remembering, or making decisions?: No Patient able to express need for assistance with ADLs?: Yes Does the patient have difficulty dressing or bathing?: No Independently performs ADLs?: Yes (appropriate for developmental age) Does the patient have difficulty walking or climbing stairs?: No Weakness of Legs: None Weakness of Arms/Hands: None  Permission  Sought/Granted Permission sought to share information with : Case Manager, Family Supports, PCP Permission granted to share information with : Yes, Verbal Permission Granted  Share Information with NAME: Baldo Ash  Permission granted to share info w AGENCY: Home Health  Permission granted to share info w Relationship: daughter  Permission granted to share info w Contact Information: [phone]  Emotional Assessment Appearance:: Appears stated age Attitude/Demeanor/Rapport: Engaged Affect (typically observed):  Accepting Orientation: : Oriented to Self, Oriented to Place, Oriented to  Time, Oriented to Situation   Psych Involvement: No (comment)  Admission diagnosis:  AAA (abdominal aortic aneurysm) (HCC) [I71.40] Patient Active Problem List   Diagnosis Date Noted   AAA (abdominal aortic aneurysm) (HCC) 09/26/2022   Urinary incontinence 06/14/2022   Debility 05/07/2022   Orchitis and epididymitis 05/06/2022   Hematuria 01/03/2022   Gross hematuria 01/02/2022   Acute urinary obstruction 01/02/2022   Epididymoorchitis 11/28/2021   Chronic heart failure with preserved ejection fraction (HFpEF) (HCC) 11/28/2021   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/06/2021   Upper airway cough syndrome 02/04/2021   Nocturnal hypoxemia 06/27/2020   DOE (dyspnea on exertion) 06/07/2020   Rt Sided Community acquired pneumonia 03/26/2020   Microcytic anemia 03/26/2020   --Severe pulmonary HTN, 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 [place] [address] Phone:  [phone] Fax: [phone]  [facility] [address] [place] - [address]  [place] [address] Phone: [phone] Fax: [phone]  [facility] - [place], [place] - [address] [place] [address] Phone: [phone] Fax: [phone]     Social Determinants of Health (SDOH) Social History: SDOH Screenings   Food Insecurity: No Food Insecurity (09/26/2022)  Housing: Low Risk  (09/26/2022)  Transportation Needs: No Transportation Needs (09/26/2022)  Utilities: Not At Risk (09/26/2022)  Tobacco Use: Medium Risk (09/28/2022)   SDOH Interventions:     Readmission Risk Interventions    05/07/2022   11:28 AM 11/07/2021   12:48 PM  Readmission Risk Prevention Plan  Transportation Screening Complete Complete  Home Care Screening  Complete  Medication Review (RN CM)  Complete  Medication Review Oceanographer) Complete   HRI or Home Care Consult Complete   SW Recovery Care/Counseling Consult Complete   Palliative Care Screening Not Applicable   Skilled Nursing Facility Complete

## 2022-09-29 LAB — GLUCOSE, CAPILLARY
Glucose-Capillary: 113 mg/dL — ABNORMAL HIGH (ref 70–99)
Glucose-Capillary: 108 mg/dL — ABNORMAL HIGH (ref 70–99)
Glucose-Capillary: 142 mg/dL — ABNORMAL HIGH (ref 70–99)
Glucose-Capillary: 106 mg/dL — ABNORMAL HIGH (ref 70–99)

## 2022-09-29 MED ORDER — TRAMADOL HCL 50 MG PO TABS
50.0000 mg | ORAL_TABLET | Freq: Four times a day (QID) | ORAL | Status: DC | PRN
  Administered 2022-09-29 – 2022-10-06 (×14): 50 mg via ORAL
  Filled 2022-09-29 (×14): qty 1

## 2022-09-29 MED ORDER — FLEET ENEMA 7-19 GM/118ML RE ENEM
1.0000 | ENEMA | Freq: Once | RECTAL | Status: AC
  Administered 2022-09-29: 1 via RECTAL
  Filled 2022-09-29: qty 1

## 2022-09-29 MED ORDER — BISACODYL 10 MG RE SUPP
10.0000 mg | Freq: Every day | RECTAL | Status: DC | PRN
  Administered 2022-09-29: 10 mg via RECTAL
  Filled 2022-09-29: qty 1

## 2022-09-29 MED ORDER — WARFARIN SODIUM 2 MG PO TABS
2.0000 mg | ORAL_TABLET | Freq: Every day | ORAL | Status: DC
  Administered 2022-09-29 – 2022-10-01 (×3): 2 mg via ORAL
  Filled 2022-09-29 (×3): qty 1

## 2022-09-29 MED ORDER — WARFARIN - PHYSICIAN DOSING INPATIENT: Freq: Every day | Status: DC

## 2022-09-29 MED ORDER — POLYETHYLENE GLYCOL 3350 17 G PO PACK
17.0000 g | PACK | Freq: Every day | ORAL | Status: DC
  Administered 2022-09-29 – 2022-10-06 (×7): 17 g via ORAL
  Filled 2022-09-29 (×8): qty 1

## 2022-09-29 NOTE — Discharge Instructions (Signed)
  [facility]   Discharge Instructions  Endovascular Aortic Aneurysm Repair  Please refer to the following instructions for your post-procedure care. Your surgeon or Physician Assistant will discuss any changes with you.  Activity  You are encouraged to walk as much as you can. You can slowly return to normal activities but must avoid strenuous activity and heavy lifting until your doctor tells you it's OK. Avoid activities such as vacuuming or swinging a gold club. It is normal to feel tired for several weeks after your surgery. Do not drive until your doctor gives the OK and you are no longer taking prescription pain medications. It is also normal to have difficulty with sleep habits, eating, and bowel movements after surgery. These will go away with time.  Bathing/Showering  Shower daily after you go home.  Do not soak in a bathtub, hot tub, or swim until the incision heals completely.  If you have incisions in your groin, wash the groin wounds with soap and water daily and pat dry. (No tub bath-only shower)  Then put a dry gauze or washcloth there to keep this area dry to help prevent wound infection daily and as needed.  Do not use Vaseline or neosporin on your incisions.  Only use soap and water on your incisions and then protect and keep dry.  Incision Care  Shower every day. Clean your incision with mild soap and water. Pat the area dry with a clean towel. You do not need a bandage unless otherwise instructed. Do not apply any ointments or creams to your incision. If you clothing is irritating, you may cover your incision with a dry gauze pad.  Diet  Resume your normal diet. There are no special food restrictions following this procedure. A low fat/low cholesterol diet is recommended for all patients with vascular disease. In order to heal from your surgery, it is CRITICAL to get adequate nutrition. Your body requires vitamins, minerals, and protein.  Vegetables are the best source of vitamins and minerals. Vegetables also provide the perfect balance of protein. Processed food has little nutritional value, so try to avoid this.  Medications  Resume taking all of your medications unless your doctor or nurse practitioner tells you not to. If your incision is causing pain, you may take over-the-counter pain relievers such as acetaminophen (Tylenol). If you were prescribed a stronger pain medication, please be aware these medications can cause nausea and constipation. Prevent nausea by taking the medication with a snack or meal. Avoid constipation by drinking plenty of fluids and eating foods with a high amount of fiber, such as fruits, vegetables, and grains.  Do not take Tylenol if you are taking prescription pain medications.   Follow up  Our office will schedule a follow-up appointment with a CT scan 3-4 weeks after your surgery.  Please call us immediately for any of the following conditions  Severe or worsening pain in your legs or feet or in your abdomen back or chest. Increased pain, redness, drainage (pus) from your incision site. Increased abdominal pain, bloating, nausea, vomiting or persistent diarrhea. Fever of 101 degrees or higher. Swelling in your leg (s),  Reduce your risk of vascular disease  Stop smoking. If you would like help call [facility] at 1-800-QUIT-NOW (1-800-784-8669) or [place] at [phone]. Manage your cholesterol Maintain a desired weight Control your diabetes Keep your blood pressure down  If you have questions, please call the office at [phone].  

## 2022-09-29 NOTE — Progress Notes (Addendum)
  Progress Note    09/29/2022 8:54 AM 3 Days Post-Op  Subjective:  says he does not feel great this morning, bloated. Has not had BM since prior to his surgery. Also got a little lightheaded with PT yesterday so concerns him for going home today as he lives alone. Would like to have BM and mobilize a little more today   Vitals:   09/29/22 0610 09/29/22 0821  BP:  (!) 143/61  Pulse: 63 67  Resp: 17 20  Temp:  98.3 F (36.8 C)  SpO2: 95% 97%   Physical Exam: Cardiac:  regular Lungs:  non labored Incisions:  B groin incisions are intact, ecchymosis to Right groin unchanged Extremities:  well perfsued and warm with palpable L DP, brisk doppler DP/PT signals in right foot Abdomen:  soft, non tender, non distended Neurologic: alert and oriented  CBC    Component Value Date/Time   WBC 8.4 09/28/2022 0754   RBC 2.89 (L) 09/28/2022 0754   HGB 8.7 (L) 09/28/2022 0754   HGB 13.4 08/23/2021 0934   HCT 26.1 (L) 09/28/2022 0754   HCT 41.7 08/23/2021 0934   PLT 100 (L) 09/28/2022 0754   MCV 90.3 09/28/2022 0754   MCV 91 08/23/2021 0934   MCH 30.1 09/28/2022 0754   MCHC 33.3 09/28/2022 0754   RDW 16.9 (H) 09/28/2022 0754   RDW 14.8 08/23/2021 0934   LYMPHSABS 2.1 07/10/2022 2217   LYMPHSABS 1.5 08/23/2021 0934   MONOABS 0.7 07/10/2022 2217   EOSABS 0.2 07/10/2022 2217   EOSABS 0.2 08/23/2021 0934   BASOSABS 0.1 07/10/2022 2217   BASOSABS 0.1 08/23/2021 0934    BMET    Component Value Date/Time   NA 135 09/28/2022 0754   NA 142 03/29/2022 0843   K 4.4 09/28/2022 0754   CL 105 09/28/2022 0754   CO2 20 (L) 09/28/2022 0754   GLUCOSE 168 (H) 09/28/2022 0754   BUN 13 09/28/2022 0754   BUN 15 03/29/2022 0843   CREATININE 1.03 09/28/2022 0754   CALCIUM 7.9 (L) 09/28/2022 0754   GFRNONAA >60 09/28/2022 0754   GFRAA >60 12/14/2019 1514    INR    Component Value Date/Time   INR 2.2 (H) 09/26/2022 1228     Intake/Output Summary (Last 24 hours) at 09/29/2022 0854 Last  data filed at 09/29/2022 0818 Gross per 24 hour  Intake 360 ml  Output 1850 ml  Net -1490 ml     Assessment/Plan:  82 y.o. male is s/p EVAR with repair of right common femoral artery 3 Days Post-Op   BLE well perfused and warm with palpable L DP and brisk DP/ PT signals on right B groin incisions are intact, ecchymosis to Right groin unchanged Will restart home coumadin today  Bowel regimen. Will place order for enema He feels he needs one more day to work with therapy and also has not had BM in 5 days Encourage OOB/ mobilize as able Hopefully home tomorrow  Follow up will be arranged in 1 month with CTA   Graceann Congress, PA-C [facility] [phone] 09/29/2022 8:54 AM  I agree with the above.  Have seen and evaluated the patient.  Stable ecchymosis in right groin.  No further signs of bleeding.  He feels like he needs to have a bowel movement.  I have encouraged him to ambulate today.  We will consider discharge home tomorrow.  Coumadin has been restarted.  Durene Cal

## 2022-09-29 NOTE — Progress Notes (Signed)
Mobility Specialist: Progress Note   09/29/22 1104  Mobility  Activity Ambulated with assistance in hallway  Level of Assistance Contact guard assist, steadying assist  Assistive Device Front wheel walker  Distance Ambulated (ft) 130 ft  Activity Response Tolerated well  Mobility Referral Yes  $Mobility charge 1 Mobility  Mobility Specialist Start Time (ACUTE ONLY) 1003  Mobility Specialist Stop Time (ACUTE ONLY) 1032  Mobility Specialist Time Calculation (min) (ACUTE ONLY) 29 min   Pre-Mobility: 73 HR, 100% SpO2 During Mobility: 85 HR Post-Mobility: 86 HR, 99% SpO2  Pt received in the bed and agreeable to mobility. MinA with bed mobility and contact guard during ambulation. C/o Rt groin and scrotal pain during ambulation, otherwise asymptomatic. Pt to the chair after session with call bell and phone in reach.   Solon Alban Mobility Specialist Please contact via SecureChat or Rehab office at [phone]

## 2022-09-30 ENCOUNTER — Inpatient Hospital Stay (HOSPITAL_COMMUNITY): Payer: Medicare HMO

## 2022-09-30 DIAGNOSIS — Z7901 Long term (current) use of anticoagulants: Secondary | ICD-10-CM | POA: Diagnosis not present

## 2022-09-30 DIAGNOSIS — I129 Hypertensive chronic kidney disease with stage 1 through stage 4 chronic kidney disease, or unspecified chronic kidney disease: Secondary | ICD-10-CM | POA: Diagnosis not present

## 2022-09-30 DIAGNOSIS — I25708 Atherosclerosis of coronary artery bypass graft(s), unspecified, with other forms of angina pectoris: Secondary | ICD-10-CM | POA: Diagnosis not present

## 2022-09-30 DIAGNOSIS — D6869 Other thrombophilia: Secondary | ICD-10-CM | POA: Diagnosis not present

## 2022-09-30 DIAGNOSIS — E1122 Type 2 diabetes mellitus with diabetic chronic kidney disease: Secondary | ICD-10-CM | POA: Diagnosis not present

## 2022-09-30 DIAGNOSIS — I2510 Atherosclerotic heart disease of native coronary artery without angina pectoris: Secondary | ICD-10-CM | POA: Diagnosis not present

## 2022-09-30 DIAGNOSIS — N182 Chronic kidney disease, stage 2 (mild): Secondary | ICD-10-CM | POA: Diagnosis not present

## 2022-09-30 LAB — URINALYSIS, ROUTINE W REFLEX MICROSCOPIC
Bacteria, UA: NONE SEEN
Bilirubin Urine: NEGATIVE
Glucose, UA: NEGATIVE mg/dL
Ketones, ur: NEGATIVE mg/dL
Leukocytes,Ua: NEGATIVE
Nitrite: NEGATIVE
Protein, ur: NEGATIVE mg/dL
Specific Gravity, Urine: 1.006 (ref 1.005–1.030)
pH: 7 (ref 5.0–8.0)

## 2022-09-30 LAB — GLUCOSE, CAPILLARY
Glucose-Capillary: 122 mg/dL — ABNORMAL HIGH (ref 70–99)
Glucose-Capillary: 126 mg/dL — ABNORMAL HIGH (ref 70–99)
Glucose-Capillary: 109 mg/dL — ABNORMAL HIGH (ref 70–99)
Glucose-Capillary: 143 mg/dL — ABNORMAL HIGH (ref 70–99)

## 2022-09-30 LAB — BPAM RBC
Blood Product Expiration Date: 202407162359
ISSUE DATE / TIME: 202406270636
Unit Type and Rh: 6200
Unit Type and Rh: 6200

## 2022-09-30 LAB — TYPE AND SCREEN
ABO/RH(D): A POS
Antibody Screen: NEGATIVE

## 2022-09-30 LAB — PROTIME-INR
INR: 1.2 (ref 0.8–1.2)
Prothrombin Time: 15.5 seconds — ABNORMAL HIGH (ref 11.4–15.2)

## 2022-09-30 NOTE — Progress Notes (Signed)
Mobility Specialist: Progress Note   09/30/22 1703  Mobility  Activity Ambulated with assistance in hallway  Level of Assistance +2 (takes two people) (Chair follow)  Press photographer wheel walker  Distance Ambulated (ft) 50 ft  Activity Response Tolerated well  Mobility Referral Yes  $Mobility charge 1 Mobility  Mobility Specialist Start Time (ACUTE ONLY) 1630  Mobility Specialist Stop Time (ACUTE ONLY) 1701  Mobility Specialist Time Calculation (min) (ACUTE ONLY) 31 min   Pre-Mobility: 72 HR, 98% SpO2 Post-Mobility: 80 HR, 99% SpO2  Pt received in the bed and agreeable to mobility. Mod I with bed mobility, requiring increased time d/t scrotal pain. MinA to stand. Distance limited secondary to pain. Pt wheeled back to the room with call bell and phone in reach. Family present in the room.   Troy [name] Mobility Specialist Please contact via SecureChat or Rehab office at [phone]

## 2022-09-30 NOTE — Progress Notes (Addendum)
  Progress Note    09/30/2022 8:18 AM 4 Days Post-Op  Subjective:  sitting up on side of bed, says he is feeling much worse this morning. Productive cough, also pain increased in right groin with radiation to scrotum and right flank. Also complaining of burning pain with urination. Says he has history of UTI's and this feels the same   Vitals:   09/30/22 0516 09/30/22 0746  BP: (!) 158/62 (!) 149/69  Pulse:  87  Resp: 15 20  Temp:  98.4 F (36.9 C)  SpO2:  96%   Physical Exam: Cardiac:  irregular Lungs:  non labored,audible crackles Incisions:  B groin incisions are intact. Right groin with significant ecchymosis extending to right flank and scrotum. Soft without hematoma Extremities: BLE well perfused and warm palpable L DP and right PT Abdomen:  soft Neurologic: alert and oriented  CBC    Component Value Date/Time   WBC 8.4 09/28/2022 0754   RBC 2.89 (L) 09/28/2022 0754   HGB 8.7 (L) 09/28/2022 0754   HGB 13.4 08/23/2021 0934   HCT 26.1 (L) 09/28/2022 0754   HCT 41.7 08/23/2021 0934   PLT 100 (L) 09/28/2022 0754   MCV 90.3 09/28/2022 0754   MCV 91 08/23/2021 0934   MCH 30.1 09/28/2022 0754   MCHC 33.3 09/28/2022 0754   RDW 16.9 (H) 09/28/2022 0754   RDW 14.8 08/23/2021 0934   LYMPHSABS 2.1 07/10/2022 2217   LYMPHSABS 1.5 08/23/2021 0934   MONOABS 0.7 07/10/2022 2217   EOSABS 0.2 07/10/2022 2217   EOSABS 0.2 08/23/2021 0934   BASOSABS 0.1 07/10/2022 2217   BASOSABS 0.1 08/23/2021 0934    BMET    Component Value Date/Time   NA 135 09/28/2022 0754   NA 142 03/29/2022 0843   K 4.4 09/28/2022 0754   CL 105 09/28/2022 0754   CO2 20 (L) 09/28/2022 0754   GLUCOSE 168 (H) 09/28/2022 0754   BUN 13 09/28/2022 0754   BUN 15 03/29/2022 0843   CREATININE 1.03 09/28/2022 0754   CALCIUM 7.9 (L) 09/28/2022 0754   GFRNONAA >60 09/28/2022 0754   GFRAA >60 12/14/2019 1514    INR    Component Value Date/Time   INR 1.2 09/30/2022 0155     Intake/Output Summary  (Last 24 hours) at 09/30/2022 0818 Last data filed at 09/30/2022 0600 Gross per 24 hour  Intake 480 ml  Output 2300 ml  Net -1820 ml     Assessment/Plan:  82 y.o. male is s/p  EVAR with repair of right common femoral artery 4 Days Post-Op.   BLE well perfused and warm with palpable L DP and brisk DP/ PT signals on right B groin incisions are intact, ecchymosis to Right groin unchanged Tolerated ambulating in hallway yesterday BM yesterday Encourage Incentive spirometry Chest x ray ordered. Productive cough UA ordered Encourage OOB/ mobilization  Dory Horn [facility] [phone] 09/30/2022 8:18 AM  I agree with the above.  I have seen and evaluated the patient.  He does not feel well this morning.  He is complaining of worsening scrotal pain.  On exam, there is no significant change in his hematoma in the scrotum or right groin.  He is complaining of dysuria and so urinalysis will be checked.  He is not ready for discharge.  Durene Cal

## 2022-10-01 ENCOUNTER — Telehealth: Payer: Self-pay | Admitting: Vascular Surgery

## 2022-10-01 LAB — GLUCOSE, CAPILLARY
Glucose-Capillary: 131 mg/dL — ABNORMAL HIGH (ref 70–99)
Glucose-Capillary: 110 mg/dL — ABNORMAL HIGH (ref 70–99)
Glucose-Capillary: 109 mg/dL — ABNORMAL HIGH (ref 70–99)
Glucose-Capillary: 140 mg/dL — ABNORMAL HIGH (ref 70–99)

## 2022-10-01 LAB — PROTIME-INR
INR: 1.2 (ref 0.8–1.2)
Prothrombin Time: 15.2 seconds (ref 11.4–15.2)

## 2022-10-01 NOTE — Telephone Encounter (Signed)
Pt currently still in hospital. 

## 2022-10-01 NOTE — Progress Notes (Signed)
Physical Therapy Treatment Patient Details Name: Troy [name] MRN: [id number] DOB: 01/09/1941 Today's Date: 10/01/2022   History of Present Illness 82 yo male admitted 6/26 for AAA repair s/p EVAR with open Rt CFA repair. PMhx: HLD, HTN, T2DM, Afib, anemia, CAD s/p CABG, prostate CA    PT Comments    Pt received sitting EOB and agreeable to session. Pt reporting increased groin pain this session despite receiving pain meds recently and requires increased time to perform mobility tasks. Pt requiring min A to stand from EOB and demonstrates very slow power up. Pt requesting to don brief to assess if it improves pain. Pt sitting back to EOB and assisted with donning brief. Pt reporting no significant improvement in standing, but an improvement in sitting. Pt able to tolerate short distance to recliner, but defers further ambulation due to pain despite motivation to increase distance. Pt able to perform seated exercises and reports performing exercises independently in bed and recliner. Pt continues to benefit from PT services to progress toward functional mobility goals.     Recommendations for follow up therapy are one component of a multi-disciplinary discharge planning process, led by the attending physician.  Recommendations may be updated based on patient status, additional functional criteria and insurance authorization.     Assistance Recommended at Discharge Intermittent Supervision/Assistance  Patient can return home with the following A little help with bathing/dressing/bathroom;Assistance with cooking/housework;Assist for transportation   Equipment Recommendations  None recommended by PT    Recommendations for Other Services       Precautions / Restrictions Precautions Precautions: Fall;Other (comment) Precaution Comments: incontinent Restrictions Weight Bearing Restrictions: No     Mobility  Bed Mobility               General bed mobility comments: Pt sitting EOB  upon arrival    Transfers Overall transfer level: Needs assistance   Transfers: Sit to/from Stand Sit to Stand: Min assist, From elevated surface           General transfer comment: STS from elevated EOB with min A for power up and steadying    Ambulation/Gait Ambulation/Gait assistance: Min assist Gait Distance (Feet): 4 Feet Assistive device: Rolling walker (2 wheels) Gait Pattern/deviations: Step-through pattern, Decreased stride length, Trunk flexed, Antalgic       General Gait Details: Min A intermittently for balance and support due to increased pain. Cues for upright posture and RW proximity.     Balance Overall balance assessment: Needs assistance Sitting-balance support: Feet supported, No upper extremity supported Sitting balance-Leahy Scale: Fair Sitting balance - Comments: sitting EOB   Standing balance support: Bilateral upper extremity supported, During functional activity, Reliant on assistive device for balance Standing balance-Leahy Scale: Poor Standing balance comment: with RW and intermittent min A                            Cognition Arousal/Alertness: Awake/alert Behavior During Therapy: WFL for tasks assessed/performed Overall Cognitive Status: Within Functional Limits for tasks assessed                                          Exercises General Exercises - Lower Extremity Long Arc Quad: AROM, Seated, Both, 5 reps Hip Flexion/Marching: AROM, Seated, Both, 5 reps    General Comments        Pertinent Vitals/Pain Pain Assessment  Pain Assessment: 0-10 Pain Score: 10-Worst pain ever Pain Location: Rt groin and hip during mobility Pain Descriptors / Indicators: Tender, Guarding, Grimacing, Moaning Pain Intervention(s): Monitored during session, Limited activity within patient's tolerance, Repositioned     PT Goals (current goals can now be found in the care plan section) Acute Rehab PT Goals Patient Stated  Goal: return home PT Goal Formulation: With patient Time For Goal Achievement: 10/11/22 Potential to Achieve Goals: Good Progress towards PT goals: Progressing toward goals    Frequency    Min 1X/week      PT Plan Current plan remains appropriate       AM-PAC PT "6 Clicks" Mobility   Outcome Measure  Help needed turning from your back to your side while in a flat bed without using bedrails?: A Little Help needed moving from lying on your back to sitting on the side of a flat bed without using bedrails?: A Little Help needed moving to and from a bed to a chair (including a wheelchair)?: A Little Help needed standing up from a chair using your arms (e.g., wheelchair or bedside chair)?: A Little Help needed to walk in hospital room?: A Little Help needed climbing 3-5 steps with a railing? : A Lot 6 Click Score: 17    End of Session Equipment Utilized During Treatment: Gait belt Activity Tolerance: Patient limited by pain Patient left: with call bell/phone within reach;in chair Nurse Communication: Mobility status PT Visit Diagnosis: Other abnormalities of gait and mobility (R26.89);Difficulty in walking, not elsewhere classified (R26.2)     Time: 1610-9604 PT Time Calculation (min) (ACUTE ONLY): 26 min  Charges:  $Gait Training: 8-22 mins $Therapeutic Activity: 8-22 mins                     Johny Shock, PTA Acute Rehabilitation Services Secure Chat Preferred  [phone]    Johny Shock 10/01/2022, 10:16 AM

## 2022-10-01 NOTE — Progress Notes (Signed)
Mobility Specialist Progress Note:   10/01/22 1500  Mobility  Activity  (Stood at chair)  Level of Assistance Minimal assist, patient does 75% or more  Assistive Device Front wheel walker  Activity Response Tolerated fair  Mobility Referral Yes  $Mobility charge 1 Mobility  Mobility Specialist Start Time (ACUTE ONLY) 1305  Mobility Specialist Stop Time (ACUTE ONLY) 1321  Mobility Specialist Time Calculation (min) (ACUTE ONLY) 16 min    Post Mobility:  97/41 (57) BP                       118/49 (68) BP - Legs elevated  Pt received in chair, agreeable to mobility. Performed STS x1 with RW and MinA. Wanted to ambulate but unable to d/t scrotal pain. No other c/o. Pt left in chair with call bell and all needs met.  D'Vante Earlene Plater Mobility Specialist Please contact via Special educational needs teacher or Rehab office at [phone]

## 2022-10-01 NOTE — Care Management Important Message (Signed)
Important Message  Patient Details  Name: Troy [name] MRN: [id number] Date of Birth: 02-Apr-1941   Medicare Important Message Given:  Yes     Renie Ora 10/01/2022, 9:01 AM

## 2022-10-01 NOTE — Progress Notes (Addendum)
  Progress Note    10/01/2022 7:58 AM 5 Days Post-Op  Subjective:  says he is still feel bad, groin/scrotal pain somewhat better   Vitals:   09/30/22 2336 10/01/22 0356  BP: (!) 128/52 (!) 134/53  Pulse: 65 73  Resp: 16 17  Temp: 98.5 F (36.9 C) 100 F (37.8 C)  SpO2: 100% 100%   Physical Exam: Cardiac:  regular Lungs:  audible crackles, non labored Incisions:  Bilateral groin incisions are clean, dry and intact. Ecchymosis present but resolving. Soft surrounding incisions Extremities: BLE well perfused and warm with palpable L DP/ right PT Abdomen:  soft, non distended Neurologic: alert and oriented  CBC    Component Value Date/Time   WBC 8.4 09/28/2022 0754   RBC 2.89 (L) 09/28/2022 0754   HGB 8.7 (L) 09/28/2022 0754   HGB 13.4 08/23/2021 0934   HCT 26.1 (L) 09/28/2022 0754   HCT 41.7 08/23/2021 0934   PLT 100 (L) 09/28/2022 0754   MCV 90.3 09/28/2022 0754   MCV 91 08/23/2021 0934   MCH 30.1 09/28/2022 0754   MCHC 33.3 09/28/2022 0754   RDW 16.9 (H) 09/28/2022 0754   RDW 14.8 08/23/2021 0934   LYMPHSABS 2.1 07/10/2022 2217   LYMPHSABS 1.5 08/23/2021 0934   MONOABS 0.7 07/10/2022 2217   EOSABS 0.2 07/10/2022 2217   EOSABS 0.2 08/23/2021 0934   BASOSABS 0.1 07/10/2022 2217   BASOSABS 0.1 08/23/2021 0934    BMET    Component Value Date/Time   NA 135 09/28/2022 0754   NA 142 03/29/2022 0843   K 4.4 09/28/2022 0754   CL 105 09/28/2022 0754   CO2 20 (L) 09/28/2022 0754   GLUCOSE 168 (H) 09/28/2022 0754   BUN 13 09/28/2022 0754   BUN 15 03/29/2022 0843   CREATININE 1.03 09/28/2022 0754   CALCIUM 7.9 (L) 09/28/2022 0754   GFRNONAA >60 09/28/2022 0754   GFRAA >60 12/14/2019 1514    INR    Component Value Date/Time   INR 1.2 10/01/2022 0118     Intake/Output Summary (Last 24 hours) at 10/01/2022 0758 Last data filed at 10/01/2022 0523 Gross per 24 hour  Intake 360 ml  Output 1300 ml  Net -940 ml     Assessment/Plan:  82 y.o. male is s/p EVAR  with repair of right common femoral artery  5 Days Post-Op   BLE well perfused and warm with palpable L DP and brisk DP/ PT signals on right B groin incisions are intact, ecchymosis to Right groin unchanged Scrotal pain improved UA negative Bowel regimen. May need another suppository today Productive cough. Chest x ray ordered. Infiltrates on Right lung? Febrile overnight but afebrile this morning No leukocytosis Will continue to monitor Encourage Incentive spirometry Encourage OOB/ mobilization   Dory Horn [facility] [phone] 10/01/2022 7:58 AM  VASCULAR STAFF ADDENDUM: I have independently interviewed and examined the patient. I agree with the above.  Scrotal pain from edema - improving slowly Wil follow up Xray  OOB/ PT/OT D/c pending further improvement   Fara Olden, MD [facility] Phone Number: [phone] 10/01/2022 11:37 AM

## 2022-10-01 NOTE — Telephone Encounter (Signed)
-----   Message from Graceann Congress, [place] sent at 09/29/2022  9:00 AM EDT ----- S/p EVAR by Dr. Chestine Spore. He needs follow up in 1 month with CTA abdomen/ pelvis and to see Dr. Chestine Spore. Sorry if message was already sent to office. thanks

## 2022-10-02 ENCOUNTER — Inpatient Hospital Stay (HOSPITAL_COMMUNITY): Payer: Medicare HMO

## 2022-10-02 LAB — GLUCOSE, CAPILLARY
Glucose-Capillary: 113 mg/dL — ABNORMAL HIGH (ref 70–99)
Glucose-Capillary: 117 mg/dL — ABNORMAL HIGH (ref 70–99)
Glucose-Capillary: 105 mg/dL — ABNORMAL HIGH (ref 70–99)
Glucose-Capillary: 94 mg/dL (ref 70–99)
Glucose-Capillary: 131 mg/dL — ABNORMAL HIGH (ref 70–99)

## 2022-10-02 LAB — PROTIME-INR
INR: 1.2 (ref 0.8–1.2)
Prothrombin Time: 14.9 seconds (ref 11.4–15.2)

## 2022-10-02 MED ORDER — WARFARIN SODIUM 2 MG PO TABS: 2.0000 mg | ORAL_TABLET | Freq: Every day | ORAL | Status: DC

## 2022-10-02 MED ORDER — WARFARIN SODIUM 4 MG PO TABS
4.0000 mg | ORAL_TABLET | Freq: Once | ORAL | Status: AC
  Administered 2022-10-02: 4 mg via ORAL
  Filled 2022-10-02: qty 1

## 2022-10-02 MED ORDER — LATANOPROST 0.005 % OP SOLN
1.0000 [drp] | Freq: Every day | OPHTHALMIC | Status: DC
  Administered 2022-10-02 – 2022-10-05 (×4): 1 [drp] via OPHTHALMIC
  Filled 2022-10-02 (×2): qty 2.5

## 2022-10-02 NOTE — Progress Notes (Signed)
Physical Therapy Treatment Patient Details Name: Troy [name] MRN: [id number] DOB: 1941/02/11 Today's Date: 10/02/2022   History of Present Illness 82 yo male admitted 6/26 for AAA repair s/p EVAR with open Rt CFA repair. PMhx: HLD, HTN, T2DM, Afib, anemia, CAD s/p CABG, prostate CA    PT Comments  Applied stockinetter to scrotum for lift and support during functional mobility.  Pre-application he was noted to have serosanguinous drainage coming from his scrotum.  He required increased time and effort this session.  He is refusing SNF placement but will not be able to mobilize at home given his aide is only present for 4 hrs a day.  Based on his progress today he would benefit from rehab in a post acute setting. He was independent at home prior to admission with intermittent assistance from aide.     Assistance Recommended at Discharge Intermittent Supervision/Assistance  If plan is discharge home, recommend the following:  Can travel by private vehicle    A little help with bathing/dressing/bathroom;Assistance with cooking/housework;Assist for transportation      Equipment Recommendations  None recommended by PT    Recommendations for Other Services       Precautions / Restrictions Precautions Precautions: Fall;Other (comment) Precaution Comments: incontinent Restrictions Weight Bearing Restrictions: No     Mobility  Bed Mobility Overal bed mobility: Needs Assistance Bed Mobility: Supine to Sit, Sit to Supine     Supine to sit: Min assist Sit to supine: Mod assist   General bed mobility comments: Pt required assistance to lift B LEs back into bed against gravity due to pain in testicles and L hip.    Transfers Overall transfer level: Needs assistance Equipment used: Rolling walker (2 wheels) Transfers: Sit to/from Stand Sit to Stand: Min assist, From elevated surface   Step pivot transfers: Supervision       General transfer comment: Cues for hand placement to  and from seated surface.  Pt slow to ascend due to pain in scrotum.    Ambulation/Gait Ambulation/Gait assistance: Min guard Gait Distance (Feet): 20 Feet (+ 10 ft) Assistive device: Rolling walker (2 wheels) Gait Pattern/deviations: Step-through pattern, Decreased stride length, Trunk flexed, Antalgic       General Gait Details: Pt required shorter gt distance due to pain in scrotum and L hip.  Increased work of breathing noted.  Pt remains unable to advance gt given his current state.   Stairs             Wheelchair Mobility     Tilt Bed    Modified Rankin (Stroke Patients Only)       Balance Overall balance assessment: Needs assistance Sitting-balance support: Feet supported, No upper extremity supported Sitting balance-Leahy Scale: Fair       Standing balance-Leahy Scale: Poor                              Cognition Arousal/Alertness: Awake/alert Behavior During Therapy: WFL for tasks assessed/performed Overall Cognitive Status: Within Functional Limits for tasks assessed                                          Exercises      General Comments        Pertinent Vitals/Pain Pain Assessment Pain Assessment: 0-10 Faces Pain Scale: Hurts even more Pain Location: Rt groin and  hip during mobility Pain Descriptors / Indicators: Tender, Guarding, Grimacing, Moaning Pain Intervention(s): Monitored during session, Repositioned    Home Living                          Prior Function            PT Goals (current goals can now be found in the care plan section) Acute Rehab PT Goals Patient Stated Goal: return home Potential to Achieve Goals: Good Progress towards PT goals: Progressing toward goals    Frequency    Min 1X/week      PT Plan Discharge plan needs to be updated    Co-evaluation              AM-PAC PT "6 Clicks" Mobility   Outcome Measure  Help needed turning from your back to your  side while in a flat bed without using bedrails?: A Little Help needed moving from lying on your back to sitting on the side of a flat bed without using bedrails?: A Little Help needed moving to and from a bed to a chair (including a wheelchair)?: A Little Help needed standing up from a chair using your arms (e.g., wheelchair or bedside chair)?: A Little Help needed to walk in hospital room?: A Little Help needed climbing 3-5 steps with a railing? : A Lot 6 Click Score: 17    End of Session Equipment Utilized During Treatment: Gait belt Activity Tolerance: Patient limited by pain Patient left: with call bell/phone within reach;in bed;with bed alarm set Nurse Communication: Mobility status (scrotum noted to be weeping when applying scrotal sling) PT Visit Diagnosis: Other abnormalities of gait and mobility (R26.89);Difficulty in walking, not elsewhere classified (R26.2)     Time: 1610-9604 PT Time Calculation (min) (ACUTE ONLY): 36 min  Charges:    $Gait Training: 8-22 mins $Therapeutic Activity: 8-22 mins PT General Charges $$ ACUTE PT VISIT: 1 Visit                     Bonney Leitz , PTA Acute Rehabilitation Services Office [phone]    Florestine Avers 10/02/2022, 4:03 PM

## 2022-10-02 NOTE — Progress Notes (Signed)
OT Cancellation Note  Patient Details Name: Troy [name] MRN: [id number] DOB: [birth year]   Cancelled Treatment:    Reason Eval/Treat Not Completed: Other (comment).  Patient just completed a session with another provider.  Resting comfortably in bed.  Continue efforts as appropriate.    Keo Schirmer D Haward Pope 10/02/2022, 4:25 PM 10/02/2022  RP, OTR/L  Acute Rehabilitation Services  Office:  [phone]

## 2022-10-02 NOTE — Progress Notes (Signed)
Inpatient Rehab Admissions Coordinator:  ? ?Per therapy recommendations,  patient was screened for CIR candidacy by Katisha Shimizu, MS, CCC-SLP. At this time, Pt. Appears to be a a potential candidate for CIR. I will place   order for rehab consult per protocol for full assessment. Please contact me any with questions. ? ?Lissett Favorite, MS, CCC-SLP ?Rehab Admissions Coordinator  ?[phone] (celll) ?[phone] (office) ? ?

## 2022-10-02 NOTE — Progress Notes (Signed)
  Progress Note    10/02/2022 1:14 PM 6 Days Post-Op  Subjective:  continues to have scrotal pain    Vitals:   10/02/22 0722 10/02/22 1158  BP: 136/60 (!) 114/52  Pulse: 68 65  Resp: 16 19  Temp: 99 F (37.2 C) 98 F (36.7 C)  SpO2: 95% 99%   Physical Exam: Cardiac:  regular Lungs:  audible crackles, non labored Incisions:  Bilateral groin incisions are clean, dry and intact. Ecchymosis present but resolving. Soft surrounding incisions Extremities: BLE well perfused and warm with palpable L DP/ right PT Abdomen:  soft, non distended Neurologic: alert and oriented  CBC    Component Value Date/Time   WBC 8.4 09/28/2022 0754   RBC 2.89 (L) 09/28/2022 0754   HGB 8.7 (L) 09/28/2022 0754   HGB 13.4 08/23/2021 0934   HCT 26.1 (L) 09/28/2022 0754   HCT 41.7 08/23/2021 0934   PLT 100 (L) 09/28/2022 0754   MCV 90.3 09/28/2022 0754   MCV 91 08/23/2021 0934   MCH 30.1 09/28/2022 0754   MCHC 33.3 09/28/2022 0754   RDW 16.9 (H) 09/28/2022 0754   RDW 14.8 08/23/2021 0934   LYMPHSABS 2.1 07/10/2022 2217   LYMPHSABS 1.5 08/23/2021 0934   MONOABS 0.7 07/10/2022 2217   EOSABS 0.2 07/10/2022 2217   EOSABS 0.2 08/23/2021 0934   BASOSABS 0.1 07/10/2022 2217   BASOSABS 0.1 08/23/2021 0934    BMET    Component Value Date/Time   NA 135 09/28/2022 0754   NA 142 03/29/2022 0843   K 4.4 09/28/2022 0754   CL 105 09/28/2022 0754   CO2 20 (L) 09/28/2022 0754   GLUCOSE 168 (H) 09/28/2022 0754   BUN 13 09/28/2022 0754   BUN 15 03/29/2022 0843   CREATININE 1.03 09/28/2022 0754   CALCIUM 7.9 (L) 09/28/2022 0754   GFRNONAA >60 09/28/2022 0754   GFRAA >60 12/14/2019 1514    INR    Component Value Date/Time   INR 1.2 10/02/2022 0116     Intake/Output Summary (Last 24 hours) at 10/02/2022 1314 Last data filed at 10/02/2022 0800 Gross per 24 hour  Intake 600 ml  Output 1500 ml  Net -900 ml      Assessment/Plan:  82 y.o. male is s/p EVAR with repair of right common femoral  artery  6 Days Post-Op   BLE well perfused and warm with palpable L DP and brisk DP/ PT signals on right B groin incisions are intact, ecchymosis to Right groin unchanged Scrotal pain - edema likely postop but states he sat on his testicles and has been having immense pain since  - scrotal ultrasound pending, scrotal support ordered.  Pt lives by himself with an aid. Currently not optimized for home. Aware he will likely need SNF.  Need to initiate paperwork.  UA negative Incontinent at baseline.  Bowel regimen. May need another suppository today  Will continue to monitor Encourage Incentive spirometry Encourage OOB/ mobilization   Victorino Sparrow, MD [facility] [phone] 10/02/2022 1:14 PM

## 2022-10-03 LAB — GLUCOSE, CAPILLARY
Glucose-Capillary: 101 mg/dL — ABNORMAL HIGH (ref 70–99)
Glucose-Capillary: 122 mg/dL — ABNORMAL HIGH (ref 70–99)
Glucose-Capillary: 122 mg/dL — ABNORMAL HIGH (ref 70–99)
Glucose-Capillary: 126 mg/dL — ABNORMAL HIGH (ref 70–99)

## 2022-10-03 LAB — PROTIME-INR
INR: 1.3 — ABNORMAL HIGH (ref 0.8–1.2)
Prothrombin Time: 16 seconds — ABNORMAL HIGH (ref 11.4–15.2)

## 2022-10-03 MED ORDER — MAGNESIUM CITRATE PO SOLN
1.0000 | Freq: Once | ORAL | Status: AC
  Administered 2022-10-03: 1 via ORAL
  Filled 2022-10-03: qty 296

## 2022-10-03 MED ORDER — BISACODYL 10 MG RE SUPP
10.0000 mg | Freq: Every day | RECTAL | Status: DC | PRN
  Administered 2022-10-03: 10 mg via RECTAL
  Filled 2022-10-03: qty 1

## 2022-10-03 MED ORDER — WARFARIN SODIUM 2 MG PO TABS
2.0000 mg | ORAL_TABLET | Freq: Every day | ORAL | Status: DC
  Administered 2022-10-04 – 2022-10-05 (×2): 2 mg via ORAL
  Filled 2022-10-03 (×2): qty 1

## 2022-10-03 MED ORDER — WARFARIN SODIUM 4 MG PO TABS
4.0000 mg | ORAL_TABLET | Freq: Once | ORAL | Status: AC
  Administered 2022-10-03: 4 mg via ORAL
  Filled 2022-10-03: qty 1

## 2022-10-03 MED ORDER — MINERAL OIL RE ENEM
1.0000 | ENEMA | Freq: Once | RECTAL | Status: DC
  Filled 2022-10-03 (×2): qty 1

## 2022-10-03 NOTE — Progress Notes (Signed)
Inpatient Rehab Admissions Coordinator:    I met with pt. And son in law to discuss potential CIR.  Explained insurance is unlikely to approve CIR and that he would likely have copays for CIR. He states he would prefer SNF rehab, and would be open to going to [facility] but would prefer a private room wherever he goes. I will let TOC know.   Megan Salon, MS, CCC-SLP Rehab Admissions Coordinator  [phone] (celll) [phone] (office)

## 2022-10-03 NOTE — Progress Notes (Signed)
Mobility Specialist Progress Note:   10/03/22 1436  Mobility  Activity Ambulated with assistance in room  Level of Assistance Contact guard assist, steadying assist  Assistive Device Front wheel walker  Distance Ambulated (ft) 15 ft  Activity Response Tolerated well  Mobility Referral Yes  $Mobility charge 1 Mobility  Mobility Specialist Start Time (ACUTE ONLY) 1348  Mobility Specialist Stop Time (ACUTE ONLY) 1400  Mobility Specialist Time Calculation (min) (ACUTE ONLY) 12 min    Pre Mobility: 114/50 BP   Pt received in chair, agreeable to mobility. C/o back pain and constipation. Pt denied any feelings of dizziness upon standing. Pt ambulated to BR. Nurse notified.    Leory Plowman  Mobility Specialist Please contact via [employer] office at [phone]

## 2022-10-03 NOTE — TOC Progression Note (Signed)
Transition of Care [facility]) - Progression Note    Patient Details  Name: Troy [name] MRN: [id number] Date of Birth: Jan 05, 1941  Transition of Care [facility]) CM/SW Contact  Eduard Roux, [place] Phone Number: 10/03/2022, 3:35 PM  Clinical Narrative:     CSW received informed by CIR- patient & family is agreeable to SNF. Preferred SNF is [facility]. CSW sent message to [facility] to review.   TOC will provide bed offers once available   Antony Blackbird, MSW, LCSW Clinical Social Worker    Expected Discharge Plan: Skilled Nursing Facility Barriers to Discharge: English as a second language teacher, Continued Medical Work up, SNF Pending bed offer  Expected Discharge Plan and Services   Discharge Planning Services: CM Consult Post Acute Care Choice: Home Health Living arrangements for the past 2 months: Single Family Home                           HH Arranged: PT HH Agency: [facility]         Social Determinants of Health (SDOH) Interventions SDOH Screenings   Food Insecurity: No Food Insecurity (09/26/2022)  Housing: Low Risk  (09/26/2022)  Transportation Needs: No Transportation Needs (09/26/2022)  Utilities: Not At Risk (09/26/2022)  Tobacco Use: Medium Risk (09/28/2022)    Readmission Risk Interventions    05/07/2022   11:28 AM 11/07/2021   12:48 PM  Readmission Risk Prevention Plan  Transportation Screening Complete Complete  Home Care Screening  Complete  Medication Review (RN CM)  Complete  Medication Review Oceanographer) Complete   HRI or Home Care Consult Complete   SW Recovery Care/Counseling Consult Complete   Palliative Care Screening Not Applicable   Skilled Nursing Facility Complete

## 2022-10-03 NOTE — Progress Notes (Addendum)
  Progress Note    10/03/2022 7:56 AM 7 Days Post-Op  Subjective:  says he feels about the same, sitting up in bed eating breakfast. Still with a lot of scrotal discomfort and has not had BM recently. Says he feels that he needs to but just passing gas   Vitals:   10/03/22 0523 10/03/22 0744  BP: 139/63 (!) 143/61  Pulse: 70 79  Resp: 15 19  Temp: 97.7 F (36.5 C) 98.4 F (36.9 C)  SpO2: 96% 92%   Physical Exam: Cardiac:  regular Lungs:  non labored, lungs sounding more clear Incisions:  Bilateral groin incisions are intact. Ecchymosis in bilateral groins extending to lower abdomen and right flank, resolving, soft Extremities:  well perfused and warm with Brisk DP Doppler signals Abdomen:  soft Neurologic: alert and oriented  CBC    Component Value Date/Time   WBC 8.4 09/28/2022 0754   RBC 2.89 (L) 09/28/2022 0754   HGB 8.7 (L) 09/28/2022 0754   HGB 13.4 08/23/2021 0934   HCT 26.1 (L) 09/28/2022 0754   HCT 41.7 08/23/2021 0934   PLT 100 (L) 09/28/2022 0754   MCV 90.3 09/28/2022 0754   MCV 91 08/23/2021 0934   MCH 30.1 09/28/2022 0754   MCHC 33.3 09/28/2022 0754   RDW 16.9 (H) 09/28/2022 0754   RDW 14.8 08/23/2021 0934   LYMPHSABS 2.1 07/10/2022 2217   LYMPHSABS 1.5 08/23/2021 0934   MONOABS 0.7 07/10/2022 2217   EOSABS 0.2 07/10/2022 2217   EOSABS 0.2 08/23/2021 0934   BASOSABS 0.1 07/10/2022 2217   BASOSABS 0.1 08/23/2021 0934    BMET    Component Value Date/Time   NA 135 09/28/2022 0754   NA 142 03/29/2022 0843   K 4.4 09/28/2022 0754   CL 105 09/28/2022 0754   CO2 20 (L) 09/28/2022 0754   GLUCOSE 168 (H) 09/28/2022 0754   BUN 13 09/28/2022 0754   BUN 15 03/29/2022 0843   CREATININE 1.03 09/28/2022 0754   CALCIUM 7.9 (L) 09/28/2022 0754   GFRNONAA >60 09/28/2022 0754   GFRAA >60 12/14/2019 1514    INR    Component Value Date/Time   INR 1.3 (H) 10/03/2022 0037     Intake/Output Summary (Last 24 hours) at 10/03/2022 0756 Last data filed at  10/03/2022 0744 Gross per 24 hour  Intake 840 ml  Output 2100 ml  Net -1260 ml     Assessment/Plan:  82 y.o. male is s/p EVAR with repair of right common femoral artery  7 Days Post-Op   BLE remain well perfused with brisk doppler Dp signals Incisions are intact and well appearing, resolving ecchymosis Suppository today to help with BM. Helped him several days ago Continue bowel regimen Continue scrotal elevation/support Duplex showed hydrocele and some cysts but no acute abnormalities Continue IS Encourage OOB/ Mobilize  CIR to eval for admission will otherwise need HH at d/c   Graceann Congress, PA-C [facility] [phone] 10/03/2022  VASCULAR STAFF ADDENDUM: I have independently interviewed and examined the patient. I agree with the above.  Scrotal ultrasound completed. No concerns.  Continues to have difficulty with ambulation.  CIR evaluation ongoing  Fara Olden, MD [facility] Phone Number: [phone] 10/03/2022 10:40 AM

## 2022-10-03 NOTE — Progress Notes (Signed)
Occupational Therapy Treatment Patient Details Name: Troy [name] MRN: [id number] DOB: [birth year] Today's Date: 10/03/2022   History of present illness 82 yo male admitted 6/26 for AAA repair s/p EVAR with open Rt CFA repair. PMhx: HLD, HTN, T2DM, Afib, anemia, CAD s/p CABG, prostate CA   OT comments  Pt completing sit to stand with Nursing upon therapy arrival with plans to walk to bathroom and attempt BM. Pt verbalized continued pain in scrotum and right posterior hip rating pain at 8/10. Pain meds were requested and provided at end of session. Pt was unable to achieve results and verbalized signs of constipation. Pt navigated from toilet back to recliner using RW and min guard assist. Demonstrated increased fatigue and increase respirations when seated in recliner. Required increased time to slow down breathing. SpO2 remained at 94% on room air. Assisted pt with positioning needs using pillows in order to tolerate sitting up in recliner. Hot pack applied to right hip to assist with pain management. Nursing informed of pt's negative results in the bathroom. Discharge recommendation updated today. Patient will benefit from continued inpatient follow up therapy, <3 hours/day. OT will continue to follow patient acutely.    Recommendations for follow up therapy are one component of a multi-disciplinary discharge planning process, led by the attending physician.  Recommendations may be updated based on patient status, additional functional criteria and insurance authorization.    Assistance Recommended at Discharge Intermittent Supervision/Assistance  Patient can return home with the following  Help with stairs or ramp for entrance;Assist for transportation;Assistance with cooking/housework;A little help with walking and/or transfers;A little help with bathing/dressing/bathroom   Equipment Recommendations  None recommended by OT       Precautions / Restrictions Precautions Precautions: Fall;Other  (comment) Precaution Comments: incontinent, scrotal edema (has fabricated sling) Restrictions Weight Bearing Restrictions: No       Mobility Bed Mobility Overal bed mobility:  (up in recliner)      Patient Response: Flat affect, Cooperative  Transfers Overall transfer level: Needs assistance Equipment used: Rolling walker (2 wheels) Transfers: Sit to/from Stand Sit to Stand: Min assist, From elevated surface     Step pivot transfers: Supervision     General transfer comment: Slow to ascend and descend d/t scrotum pain     Balance Overall balance assessment: Needs assistance Sitting-balance support: Single extremity supported, Feet supported Sitting balance-Leahy Scale: Good Sitting balance - Comments: sitting on toilet   Standing balance support: Bilateral upper extremity supported, During functional activity, Reliant on assistive device for balance Standing balance-Leahy Scale: Poor Standing balance comment: reliant on RW to maintain balance        ADL either performed or assessed with clinical judgement   ADL Overall ADL's : Needs assistance/impaired     Grooming: Wash/dry hands;Sitting;Set up      Toilet Transfer: Min guard;Rolling walker (2 wheels);Regular Toilet;Ambulation Toilet Transfer Details (indicate cue type and reason): Pt with nursing upon therapy arrival walking from recliner to bathroom Toileting- Clothing Manipulation and Hygiene: Maximal assistance;Sit to/from stand       Functional mobility during ADLs: Min guard;Rolling walker (2 wheels)        Cognition Arousal/Alertness: Awake/alert Behavior During Therapy: WFL for tasks assessed/performed Overall Cognitive Status: Within Functional Limits for tasks assessed                 General Comments continued scrotal edema present    Pertinent Vitals/ Pain       Pain Assessment Pain Assessment: 0-10 Pain Score:  8  Pain Location: scrotum when sitting on toilet, right hip Pain  Descriptors / Indicators: Discomfort, Grimacing Pain Intervention(s): Repositioned, Monitored during session, Patient requesting pain meds-RN notified, RN gave pain meds during session, Heat applied         Frequency  Min 2X/week        Progress Toward Goals  OT Goals(current goals can now be found in the care plan section)  Progress towards OT goals: Not progressing toward goals - comment (pain limiting pt's ability to progress in therapy today)     Plan Discharge plan needs to be updated;Frequency remains appropriate       AM-PAC OT "6 Clicks" Daily Activity     Outcome Measure   Help from another person eating meals?: None Help from another person taking care of personal grooming?: A Little Help from another person toileting, which includes using toliet, bedpan, or urinal?: A Lot Help from another person bathing (including washing, rinsing, drying)?: A Lot Help from another person to put on and taking off regular upper body clothing?: None Help from another person to put on and taking off regular lower body clothing?: A Lot 6 Click Score: 17    End of Session Equipment Utilized During Treatment: Gait belt;Rolling walker (2 wheels)  OT Visit Diagnosis: Unsteadiness on feet (R26.81);Muscle weakness (generalized) (M62.81)   Activity Tolerance Patient tolerated treatment well   Patient Left in chair;with call bell/phone within reach   Nurse Communication Patient requests pain meds (unsuccess in bathroom)        Time: 9604-5409 OT Time Calculation (min): 33 min  Charges: OT General Charges $OT Visit: 1 Visit OT Treatments $Self Care/Home Management : 8-22 mins $Therapeutic Activity: 8-22 mins  Limmie Patricia, OTR/L,CBIS  Supplemental OT - [facility] and [facility] Secure Chat Preferred    Sybella Harnish, Charisse March 10/03/2022, 12:26 PM

## 2022-10-04 LAB — GLUCOSE, CAPILLARY
Glucose-Capillary: 112 mg/dL — ABNORMAL HIGH (ref 70–99)
Glucose-Capillary: 110 mg/dL — ABNORMAL HIGH (ref 70–99)
Glucose-Capillary: 119 mg/dL — ABNORMAL HIGH (ref 70–99)
Glucose-Capillary: 136 mg/dL — ABNORMAL HIGH (ref 70–99)

## 2022-10-04 LAB — CBC
HCT: 23.7 % — ABNORMAL LOW (ref 39.0–52.0)
Hemoglobin: 7.5 g/dL — ABNORMAL LOW (ref 13.0–17.0)
MCH: 29.2 pg (ref 26.0–34.0)
MCHC: 31.6 g/dL (ref 30.0–36.0)
MCV: 92.2 fL (ref 80.0–100.0)
Platelets: 208 10*3/uL (ref 150–400)
RBC: 2.57 MIL/uL — ABNORMAL LOW (ref 4.22–5.81)
RDW: 15.9 % — ABNORMAL HIGH (ref 11.5–15.5)
WBC: 8.8 10*3/uL (ref 4.0–10.5)
nRBC: 0.8 % — ABNORMAL HIGH (ref 0.0–0.2)

## 2022-10-04 LAB — PROTIME-INR
INR: 1.4 — ABNORMAL HIGH (ref 0.8–1.2)
Prothrombin Time: 17.2 seconds — ABNORMAL HIGH (ref 11.4–15.2)

## 2022-10-04 NOTE — Progress Notes (Addendum)
  Progress Note    10/04/2022 9:48 AM 8 Days Post-Op  Subjective:  feeling better this morning, still complaining of scrotal discomfort. Had BM yesterday. Tolerating ambulation better   Vitals:   10/03/22 2302 10/04/22 0716  BP: (!) 140/58 139/61  Pulse: 80 81  Resp: 16 19  Temp: 98.2 F (36.8 C) 98.3 F (36.8 C)  SpO2: 100% 94%   Physical Exam: Cardiac:  regular Lungs:  non labored Incisions:  B groin incisions are intact and well appearing, ecchymosis is resolving Extremities:  well perfused and warm  Abdomen:  soft Neurologic: alert and oriented  CBC    Component Value Date/Time   WBC 8.8 10/04/2022 0546   RBC 2.57 (L) 10/04/2022 0546   HGB 7.5 (L) 10/04/2022 0546   HGB 13.4 08/23/2021 0934   HCT 23.7 (L) 10/04/2022 0546   HCT 41.7 08/23/2021 0934   PLT 208 10/04/2022 0546   MCV 92.2 10/04/2022 0546   MCV 91 08/23/2021 0934   MCH 29.2 10/04/2022 0546   MCHC 31.6 10/04/2022 0546   RDW 15.9 (H) 10/04/2022 0546   RDW 14.8 08/23/2021 0934   LYMPHSABS 2.1 07/10/2022 2217   LYMPHSABS 1.5 08/23/2021 0934   MONOABS 0.7 07/10/2022 2217   EOSABS 0.2 07/10/2022 2217   EOSABS 0.2 08/23/2021 0934   BASOSABS 0.1 07/10/2022 2217   BASOSABS 0.1 08/23/2021 0934    BMET    Component Value Date/Time   NA 135 09/28/2022 0754   NA 142 03/29/2022 0843   K 4.4 09/28/2022 0754   CL 105 09/28/2022 0754   CO2 20 (L) 09/28/2022 0754   GLUCOSE 168 (H) 09/28/2022 0754   BUN 13 09/28/2022 0754   BUN 15 03/29/2022 0843   CREATININE 1.03 09/28/2022 0754   CALCIUM 7.9 (L) 09/28/2022 0754   GFRNONAA >60 09/28/2022 0754   GFRAA >60 12/14/2019 1514    INR    Component Value Date/Time   INR 1.4 (H) 10/04/2022 0546     Intake/Output Summary (Last 24 hours) at 10/04/2022 0948 Last data filed at 10/04/2022 0716 Gross per 24 hour  Intake 240 ml  Output 1200 ml  Net -960 ml     Assessment/Plan:  82 y.o. male is s/p  EVAR with repair of right common femoral artery  8 Days  Post-Op   BLE remain well perfused with brisk doppler Dp signals Incisions are intact and well appearing, resolving ecchymosis Continue bowel regimen Continue scrotal elevation/support Continue IS Encourage OOB/ Mobilize  Not candidate for CIR. Pending authorization for SNF Anticipate possible D/c tomorrow   Dory Horn [facility] [phone] 10/04/2022 9:48 AM  I have independently examined patient and agree with PA assessment and plan above.   Zoee Heeney C. Randie Heinz, MD [facility] [place] Office: [phone] Pager: [phone]

## 2022-10-04 NOTE — TOC Progression Note (Addendum)
Transition of Care [facility]) - Progression Note    Patient Details  Name: Troy [name] MRN: [id number] Date of Birth: 06/18/1940  Transition of Care [facility]) CM/SW Contact  Carmina Miller, [place] Phone Number: 10/04/2022, 11:08 AM  Clinical Narrative:     CSW spoke with pt's son about bed offer at [facility], son states they will accept if the room is private. CSW sent Revonda Standard with [facility] a text message inquiring on room, she states she will have a private room for pt tomorrow.   Expected Discharge Plan: Skilled Nursing Facility Barriers to Discharge: English as a second language teacher, Continued Medical Work up, SNF Pending bed offer  Expected Discharge Plan and Services   Discharge Planning Services: CM Consult Post Acute Care Choice: Home Health Living arrangements for the past 2 months: Single Family Home                           HH Arranged: PT HH Agency: [facility]         Social Determinants of Health (SDOH) Interventions SDOH Screenings   Food Insecurity: No Food Insecurity (09/26/2022)  Housing: Low Risk  (09/26/2022)  Transportation Needs: No Transportation Needs (09/26/2022)  Utilities: Not At Risk (09/26/2022)  Tobacco Use: Medium Risk (09/28/2022)    Readmission Risk Interventions    05/07/2022   11:28 AM 11/07/2021   12:48 PM  Readmission Risk Prevention Plan  Transportation Screening Complete Complete  Home Care Screening  Complete  Medication Review (RN CM)  Complete  Medication Review Oceanographer) Complete   HRI or Home Care Consult Complete   SW Recovery Care/Counseling Consult Complete   Palliative Care Screening Not Applicable   Skilled Nursing Facility Complete

## 2022-10-04 NOTE — Progress Notes (Signed)
Physical Therapy Treatment Patient Details Name: KAYCEON AGUINO MRN: [id number] DOB: Jun 17, 1940 Today's Date: 10/04/2022   History of Present Illness 82 yo male admitted 6/26 for AAA repair s/p EVAR with open Rt CFA repair. PMhx: HLD, HTN, T2DM, Afib, anemia, CAD s/p CABG, prostate CA    PT Comments  Pt received in supine and agreeable to session. Pt reporting recent burning during urination and blood noted in purewick canister, RN notified. Pt able to perform bed mobility with min A and increased time. Pt able to stand x3 with min guard for safety, however is noted to be incontinent of bowel upon first stand from EOB. Pt able to stand for assist with pericare. Pt's gait distance limited by pain and fatigue. Pt able to stand and perform marching at recliner, however is limited by DOE and dizziness that improves with a seated rest break. Pt continues to benefit from PT services to progress toward functional mobility goals.      Assistance Recommended at Discharge Intermittent Supervision/Assistance  If plan is discharge home, recommend the following:  Can travel by private vehicle    A little help with bathing/dressing/bathroom;Assistance with cooking/housework;Assist for transportation      Equipment Recommendations  None recommended by PT    Recommendations for Other Services       Precautions / Restrictions Precautions Precautions: Fall;Other (comment) Precaution Comments: incontinent, scrotal edema (has fabricated sling) Restrictions Weight Bearing Restrictions: No     Mobility  Bed Mobility Overal bed mobility: Needs Assistance Bed Mobility: Supine to Sit     Supine to sit: Min assist     General bed mobility comments: Min A for trunk elevation and use of bedrail    Transfers Overall transfer level: Needs assistance Equipment used: Rolling walker (2 wheels) Transfers: Sit to/from Stand Sit to Stand: Min guard           General transfer comment: STS from EOB  x2 and from recliner x1 with cues for hand placement and min guard for safety. increased time for power up and descent due to pain    Ambulation/Gait Ambulation/Gait assistance: Min guard Gait Distance (Feet): 10 Feet Assistive device: Rolling walker (2 wheels) Gait Pattern/deviations: Step-through pattern, Decreased stride length, Trunk flexed, Antalgic       General Gait Details: Pt limited by pain and fatigue. Cues for RW proximity      Balance Overall balance assessment: Needs assistance Sitting-balance support: Single extremity supported, Feet supported Sitting balance-Leahy Scale: Good Sitting balance - Comments: sitting EOB   Standing balance support: Bilateral upper extremity supported, During functional activity, Reliant on assistive device for balance Standing balance-Leahy Scale: Poor Standing balance comment: with RW support                            Cognition Arousal/Alertness: Awake/alert Behavior During Therapy: WFL for tasks assessed/performed Overall Cognitive Status: Within Functional Limits for tasks assessed                                          Exercises      General Comments General comments (skin integrity, edema, etc.): Blood in urine noted and pt reporting burning while urinating, RN notified. Pt incontinent of bowel upon standing.      Pertinent Vitals/Pain Pain Assessment Pain Assessment: 0-10 Pain Score: 4  Pain Location: scrotum Pain Descriptors /  Indicators: Discomfort, Grimacing, Burning Pain Intervention(s): Limited activity within patient's tolerance, Monitored during session, Repositioned     PT Goals (current goals can now be found in the care plan section) Acute Rehab PT Goals Patient Stated Goal: return home PT Goal Formulation: With patient Time For Goal Achievement: 10/11/22 Potential to Achieve Goals: Good Progress towards PT goals: Progressing toward goals    Frequency    Min  1X/week      PT Plan Current plan remains appropriate    AM-PAC PT "6 Clicks" Mobility   Outcome Measure  Help needed turning from your back to your side while in a flat bed without using bedrails?: A Little Help needed moving from lying on your back to sitting on the side of a flat bed without using bedrails?: A Little Help needed moving to and from a bed to a chair (including a wheelchair)?: A Little Help needed standing up from a chair using your arms (e.g., wheelchair or bedside chair)?: A Little Help needed to walk in hospital room?: A Little Help needed climbing 3-5 steps with a railing? : A Lot 6 Click Score: 17    End of Session Equipment Utilized During Treatment: Gait belt Activity Tolerance: Patient limited by pain Patient left: in chair;with call bell/phone within reach Nurse Communication: Mobility status PT Visit Diagnosis: Other abnormalities of gait and mobility (R26.89);Difficulty in walking, not elsewhere classified (R26.2)     Time: 4098-1191 PT Time Calculation (min) (ACUTE ONLY): 23 min  Charges:    $Gait Training: 8-22 mins $Therapeutic Activity: 8-22 mins PT General Charges $$ ACUTE PT VISIT: 1 Visit                     Johny Shock, PTA Acute Rehabilitation Services Secure Chat Preferred  [phone]    Johny Shock 10/04/2022, 11:33 AM

## 2022-10-04 NOTE — Progress Notes (Signed)
Mobility Specialist Progress Note:   10/04/22 1353  Mobility  Activity Ambulated with assistance in hallway  Level of Assistance Contact guard assist, steadying assist  Assistive Device Front wheel walker  Distance Ambulated (ft) 175 ft  Activity Response Tolerated well  Mobility Referral Yes  $Mobility charge 1 Mobility  Mobility Specialist Start Time (ACUTE ONLY) 1305  Mobility Specialist Stop Time (ACUTE ONLY) 1345  Mobility Specialist Time Calculation (min) (ACUTE ONLY) 40 min   Pt received in chair, agreeable to mobility. Pt requesting to go to BR before ambulation. Assisted with pericare. During session pt c/o R leg pain, otherwise asymptomatic throughout. Pt returned to chair with call bell close by and all needs met  Leory Plowman  Mobility Specialist Please contact via SecureChat Rehab office at [phone]

## 2022-10-05 LAB — GLUCOSE, CAPILLARY
Glucose-Capillary: 118 mg/dL — ABNORMAL HIGH (ref 70–99)
Glucose-Capillary: 99 mg/dL (ref 70–99)
Glucose-Capillary: 103 mg/dL — ABNORMAL HIGH (ref 70–99)
Glucose-Capillary: 137 mg/dL — ABNORMAL HIGH (ref 70–99)

## 2022-10-05 LAB — PROTIME-INR
INR: 1.5 — ABNORMAL HIGH (ref 0.8–1.2)
Prothrombin Time: 18.5 seconds — ABNORMAL HIGH (ref 11.4–15.2)

## 2022-10-05 MED ORDER — OXYCODONE-ACETAMINOPHEN 5-325 MG PO TABS: 1.0000 | ORAL_TABLET | Freq: Four times a day (QID) | ORAL | 0 refills | Status: DC | PRN

## 2022-10-05 NOTE — Progress Notes (Addendum)
Per EMR review, pt likely ready for dc today. [employer] auth started for [facility], ref# [id number]. Confirmed with Revonda Standard at [facility] [phone] they are able to accept when auth received including weekend. Will provide updates as available.   UPDATE 1530: Berkley Harvey remains pending at this time.   Dellie Burns, MSW, LCSW [phone] (coverage)

## 2022-10-05 NOTE — Progress Notes (Signed)
Occupational Therapy Treatment Patient Details Name: Troy [name] MRN: [id number] DOB: [birth year] Today's Date: 10/05/2022   History of present illness 82 yo male admitted 6/26 for AAA repair s/p EVAR with open Rt CFA repair. PMhx: HLD, HTN, T2DM, Afib, anemia, CAD s/p CABG, prostate CA   OT comments  Patient with fair progress toward patient focused goals.  He continues to struggle with activity tolerance, pain to his low back, and scrotal edema, however he continues to work hard,  He is motivated to return to his Ind prior level of function.  Patient demonstrates the ability to participate with 3+ hours of therapy/day, and should be able to reach a Mod I level.  OT will continue efforts in the acute setting to address deficits, and Patient will benefit from intensive inpatient follow up therapy, >3 hours/day    Recommendations for follow up therapy are one component of a multi-disciplinary discharge planning process, led by the attending physician.  Recommendations may be updated based on patient status, additional functional criteria and insurance authorization.    Assistance Recommended at Discharge Frequent or constant Supervision/Assistance  Patient can return home with the following  Help with stairs or ramp for entrance;Assist for transportation;Assistance with cooking/housework;A little help with walking and/or transfers;A little help with bathing/dressing/bathroom   Equipment Recommendations  None recommended by OT    Recommendations for Other Services      Precautions / Restrictions Precautions Precautions: Fall;Other (comment) Precaution Comments: incontinent, scrotal edema (has fabricated sling) Restrictions Weight Bearing Restrictions: No       Mobility Bed Mobility Overal bed mobility: Needs Assistance Bed Mobility: Sit to Supine       Sit to supine: Min guard        Transfers Overall transfer level: Needs assistance Equipment used: Rolling walker (2  wheels) Transfers: Sit to/from Stand Sit to Stand: Min guard     Step pivot transfers: Supervision           Balance Overall balance assessment: Needs assistance Sitting-balance support: Single extremity supported, Feet supported Sitting balance-Leahy Scale: Good     Standing balance support: Bilateral upper extremity supported, During functional activity, Reliant on assistive device for balance Standing balance-Leahy Scale: Poor                             ADL either performed or assessed with clinical judgement   ADL       Grooming: Wash/dry hands;Min guard;Standing;Wash/dry face   Upper Body Bathing: Set up;Sitting   Lower Body Bathing: Moderate assistance;Sit to/from stand   Upper Body Dressing : Set up;Sitting   Lower Body Dressing: Moderate assistance;Sit to/from stand   Toilet Transfer: Rolling walker (2 wheels);Regular Toilet;Ambulation;Supervision/safety   Toileting- Clothing Manipulation and Hygiene: Moderate assistance;Sit to/from stand              Extremity/Trunk Assessment Upper Extremity Assessment Upper Extremity Assessment: Overall WFL for tasks assessed   Lower Extremity Assessment Lower Extremity Assessment: Defer to PT evaluation   Cervical / Trunk Assessment Cervical / Trunk Assessment: Kyphotic    Vision Patient Visual Report: No change from baseline     Perception Perception Perception: Not tested   Praxis Praxis Praxis: Not tested    Cognition Arousal/Alertness: Awake/alert Behavior During Therapy: WFL for tasks assessed/performed Overall Cognitive Status: Within Functional Limits for tasks assessed  Exercises      Shoulder Instructions       General Comments      Pertinent Vitals/ Pain       Pain Assessment Pain Assessment: Faces Faces Pain Scale: Hurts even more Pain Location: low back to R side Pain Descriptors / Indicators: Discomfort,  Grimacing Pain Intervention(s): Monitored during session                                                          Frequency  Min 2X/week        Progress Toward Goals  OT Goals(current goals can now be found in the care plan section)  Progress towards OT goals: Progressing toward goals  Acute Rehab OT Goals OT Goal Formulation: With patient Time For Goal Achievement: 10/12/22 Potential to Achieve Goals: Good  Plan Discharge plan needs to be updated;Frequency remains appropriate    Co-evaluation                 AM-PAC OT "6 Clicks" Daily Activity     Outcome Measure   Help from another person eating meals?: None Help from another person taking care of personal grooming?: A Little Help from another person toileting, which includes using toliet, bedpan, or urinal?: A Lot Help from another person bathing (including washing, rinsing, drying)?: A Lot Help from another person to put on and taking off regular upper body clothing?: None Help from another person to put on and taking off regular lower body clothing?: A Lot 6 Click Score: 17    End of Session Equipment Utilized During Treatment: Gait belt;Rolling walker (2 wheels)  OT Visit Diagnosis: Unsteadiness on feet (R26.81);Muscle weakness (generalized) (M62.81)   Activity Tolerance Patient tolerated treatment well   Patient Left in bed;with call bell/phone within reach   Nurse Communication Mobility status        Time: 1357-1416 OT Time Calculation (min): 19 min  Charges: OT General Charges $OT Visit: 1 Visit OT Treatments $Self Care/Home Management : 8-22 mins  10/05/2022  RP, OTR/L  Acute Rehabilitation Services  Office:  [phone]   Troy [name] 10/05/2022, 2:20 PM

## 2022-10-05 NOTE — Discharge Summary (Cosign Needed)
EVAR Discharge Summary   Troy [name] Feb 17, 1941 82 y.o. male  MRN: [id number]  Admission Date: 09/26/2022  Discharge Date: 10/06/2022  Physician: Cephus Shelling, MD  Admission Diagnosis: AAA (abdominal aortic aneurysm) [facility]) [I71.40]  Hospital Course:  The patient was admitted to the hospital and taken to the operating room on 09/26/2022 and underwent: 1.  Ultrasound-guided access of bilateral common femoral arteries for delivery of endograft including percutaneous closure  2.  Repair of infrarenal abdominal aortic aneurysm with aortobiiliac stent graft (main body right 32 mm x 14 mm x 14 cm, 20 mm x 11.5 cm bellbottom limb into the right common iliac artery, and 16 mm x 11.5 cm bellbottom limb into the left common iliac artery) 3.  Open exposure of right common femoral artery (this was a redo exposure given previous femoral-popliteal bypass) 4.  Right common femoral endarterectomy with bovine pericardial patch angioplasty By Dr. Chestine Spore.    The pt tolerated the procedure well and was transported to the PACU in good condition.   Seen day of surgery post operatively and was doing very well. Both groin incisions without swelling or hematoma. Brisk signals in both lower extremities. Post operative INR 2.2. 2 units of FFP ordered. Taken to the ICU in stable condition.   POD#1, doing well. Pain well controlled. Bilateral groin incisions intact without any swelling or hematoma. Legs well perfused and warm. Abdomen soft, non distended. No signs of active bleeding. Hemoglobin 6.5 on morning labs. 2 units of packed red blood cells ordered. Foley discontinued. Encouraged mobilization out of bed. PT recommending home health therapy.   POD#2, surgical site pain limiting mobility. Incisions intact and lower extremities remained well perfused with brisk doppler signals. Felt not ready for discharge home wanting to continue to mobilize with therapy. Had some dizziness with ambulating and some  concerns of scrotal swelling. Hemodynamically stable post transfusion. Transferred to 4E floor.   The remainder of the hospital course consisted of increasing mobilization, increased PO intake, increased bowel regimen to allow him to have more regular bowel movements, incentive spirometry use, incision site care and pain control. He did require a scrotal duplex due to his pain and swelling. This did not show any acute abnormalities. Scrotal elevation/ support was initiated with improvement. Additionally he was restarted on his home coumadin dosing for atrial fibrillation. There was no concerns of bleeding after restarting this. Initially Home health PT was recommended but patient lives independently and during hospitalization recommendation was later changed to SNF. He wanted to go to inpatient rehab but due to insurance and PT recommendations he was not felt to be good candidate.   POD#10, He remained stable for discharge to SNF. Continued improvement of mobility tolerance. Incision sites remained well appearing and intact. Continued improvement of scrotal edema and tenderness. Tolerating po intake and voiding without difficulty. PDMP was reviewed and post operative pain medication prescription will be sent with  him at discharge. He will otherwise continue all his home medications as prescribed. He will have follow up arranged in our office in 3-4 weeks for incision check and CTA EVAR protocol and to see Dr. Chestine Spore.   CBC    Component Value Date/Time   WBC 8.8 10/04/2022 0546   RBC 2.57 (L) 10/04/2022 0546   HGB 7.5 (L) 10/04/2022 0546   HGB 13.4 08/23/2021 0934   HCT 23.7 (L) 10/04/2022 0546   HCT 41.7 08/23/2021 0934   PLT 208 10/04/2022 0546   MCV 92.2 10/04/2022 0546  MCV 91 08/23/2021 0934   MCH 29.2 10/04/2022 0546   MCHC 31.6 10/04/2022 0546   RDW 15.9 (H) 10/04/2022 0546   RDW 14.8 08/23/2021 0934   LYMPHSABS 2.1 07/10/2022 2217   LYMPHSABS 1.5 08/23/2021 0934   MONOABS 0.7  07/10/2022 2217   EOSABS 0.2 07/10/2022 2217   EOSABS 0.2 08/23/2021 0934   BASOSABS 0.1 07/10/2022 2217   BASOSABS 0.1 08/23/2021 0934    BMET    Component Value Date/Time   NA 135 09/28/2022 0754   NA 142 03/29/2022 0843   K 4.4 09/28/2022 0754   CL 105 09/28/2022 0754   CO2 20 (L) 09/28/2022 0754   GLUCOSE 168 (H) 09/28/2022 0754   BUN 13 09/28/2022 0754   BUN 15 03/29/2022 0843   CREATININE 1.03 09/28/2022 0754   CALCIUM 7.9 (L) 09/28/2022 0754   GFRNONAA >60 09/28/2022 0754   GFRAA >60 12/14/2019 1514       Discharge Instructions     Call MD for:  redness, tenderness, or signs of infection (pain, swelling, bleeding, redness, odor or green/yellow discharge around incision site)   Complete by: As directed    Call MD for:  severe or increased pain, loss or decreased feeling  in affected limb(s)   Complete by: As directed    Call MD for:  temperature >100.5   Complete by: As directed    Discharge wound care:   Complete by: As directed    Okay to shower and wash incisions with mild soap and water, pat dry. Do not soak in bathtub   Driving Restrictions   Complete by: As directed    No driving for 4 weeks or while taking narcotic pain medication   Increase activity slowly   Complete by: As directed    Walk with assistance use walker or cane as needed   Lifting restrictions   Complete by: As directed    No heavy lifting, pushing pulling, > 10 lbs for 4 weeks   Resume previous diet   Complete by: As directed        Discharge Diagnosis:  AAA (abdominal aortic aneurysm) (HCC) [I71.40]  Secondary Diagnosis: Patient Active Problem List   Diagnosis Date Noted   AAA (abdominal aortic aneurysm) (HCC) 09/26/2022   Urinary incontinence 06/14/2022   Debility 05/07/2022   Orchitis and epididymitis 05/06/2022   Hematuria 01/03/2022   Gross hematuria 01/02/2022   Acute urinary obstruction 01/02/2022   Epididymoorchitis 11/28/2021   Chronic heart failure with  preserved ejection fraction (HFpEF) (HCC) 11/28/2021   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/06/2021   Upper airway cough syndrome 02/04/2021   Nocturnal hypoxemia 06/27/2020   DOE (dyspnea on exertion) 06/07/2020   Rt Sided Community acquired pneumonia 03/26/2020   Microcytic anemia 03/26/2020   --Severe pulmonary HTN, 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 Tawanna Cooler Early   Arthritis    CAD (coronary artery disease)    a. CABG 2000.   Cancer [facility])    Prostate:  Radiation Tx   Carotid artery disease (HCC)    Chest pain    precordial. mild chronic .[name]... nonischemic   CHF (congestive heart failure) (HCC)    Chronic edema    Coronary artery disease    a. Nuclear, January, 2008, no ischemia b. 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 Arbie Cookey   Sinus bradycardia    Asymptomatic     Allergies as of 10/05/2022       Reactions   Niacin Itching, Rash   Burning sensation   Contrast Media [iodinated Contrast Media] Nausea And Vomiting   Per patient vocal cords thick with increased phlegm    Gemtesa [vibegron] Other (See Comments)   UTI    Iodine-131 Nausea And Vomiting   Jardiance [empagliflozin] Other (See Comments)   UTI   Myrbetriq [mirabegron] Other (See Comments)   UTI   Nsaids Other (See Comments)   Taking Coumadin        Medication List     STOP taking these medications    HYDROcodone-acetaminophen 10-325 MG tablet Commonly known as: NORCO       TAKE these medications    acetaminophen 325 MG tablet Commonly known as: TYLENOL Take 2 tablets (650 mg total) by mouth every 6 (six) hours as needed for mild pain, fever or headache (or Fever >/= 101).   albuterol 108 (90 Base) MCG/ACT inhaler Commonly known as: VENTOLIN HFA Inhale 2 puffs into the lungs every 6 (six) hours as needed for wheezing or shortness of breath.   aspirin EC 81 MG tablet Take 1 tablet (81 mg total) by mouth daily with breakfast.   atorvastatin 40 MG tablet Commonly known as: LIPITOR Take 1 tablet (40 mg total) by mouth daily.   dutasteride 0.5 MG capsule Commonly known as: AVODART Take 1 capsule (0.5 mg total) by mouth daily.   ipratropium 0.03 % nasal spray Commonly known as: ATROVENT Place 2 sprays into both nostrils 2 (two) times daily as needed for rhinitis.   isosorbide mononitrate 60 MG 24 hr tablet Commonly known as: IMDUR Take 2 tablets (120 mg total) by mouth every evening.   latanoprost 0.005 %  ophthalmic solution Commonly known as: XALATAN Place 1 drop into both eyes at bedtime.   levocetirizine 5 MG tablet Commonly known as: XYZAL Take 5 mg by mouth every evening.   losartan  25 MG tablet Commonly known as: COZAAR Take 1 tablet (25 mg total) by mouth daily.   metFORMIN 500 MG tablet Commonly known as: GLUCOPHAGE Take 500 mg by mouth 2 (two) times daily with a meal.   nitroGLYCERIN 0.4 MG SL tablet Commonly known as: NITROSTAT Place 1 tablet (0.4 mg total) under the tongue every 5 (five) minutes x 3 doses as needed for chest pain.   oxyCODONE-acetaminophen 5-325 MG tablet Commonly known as: Percocet Take 1 tablet by mouth every 6 (six) hours as needed for severe pain.   OXYGEN Inhale 3 L into the lungs at bedtime.   pantoprazole 40 MG tablet Commonly known as: PROTONIX Take 1 tablet (40 mg total) by mouth daily. Take 30-60 min before first meal of the day   spironolactone 25 MG tablet Commonly known as: ALDACTONE Take 1 tablet (25 mg total) by mouth daily.   tamsulosin 0.4 MG Caps capsule Commonly known as: FLOMAX Take 1 capsule (0.4 mg total) by mouth daily after supper.   torsemide 10 MG tablet Commonly known as: DEMADEX Take 10-20 mg by mouth See admin instructions. Take 10 mg daily, may increase to 20 mg as needed for weight gain   triamcinolone cream 0.1 % Commonly known as: KENALOG Apply 1 Application topically 2 (two) times daily.   Vitamin D3 50 MCG (2000 UT) capsule Take 2,000 Units by mouth daily.   warfarin 2 MG tablet Commonly known as: COUMADIN Take 2 mg by mouth daily.               Discharge Care Instructions  (From admission, onward)           Start     Ordered   10/05/22 0000  Discharge wound care:       Comments: Okay to shower and wash incisions with mild soap and water, pat dry. Do not soak in bathtub   10/05/22 1126            Discharge Instructions:   [facility]   Discharge Instructions Endovascular Aortic Aneurysm Repair  Please refer to the following instructions for your post-procedure care. Your surgeon or Physician Assistant will discuss any changes with you.  Activity  You are encouraged to walk as much as you can. You can slowly return to normal activities but must avoid strenuous activity and heavy lifting until your doctor tells you it's OK. Avoid activities such as vacuuming or swinging a gold club. It is normal to feel tired for several weeks after your surgery. Do not drive until your doctor gives the OK and you are no longer taking prescription pain medications. It is also normal to have difficulty with sleep habits, eating, and bowel movements after surgery. These will go away with time.  Bathing/Showering  You may shower after you go home. If you have an incision, do not soak in a bathtub, hot tub, or swim until the incision heals completely.  Incision Care  Shower every day. Clean your incision with mild soap and water. Pat the area dry with a clean towel. You do not need a bandage unless otherwise instructed. Do not apply any ointments or creams to your incision. If you clothing is irritating, you may cover your incision with a dry gauze pad.  Diet  Resume your normal diet. There are no special food restrictions following this procedure. A low fat/low cholesterol diet is recommended for all patients with vascular disease. In order to heal from your surgery,  it is CRITICAL to get adequate nutrition. Your body requires vitamins, minerals, and protein. Vegetables are the best source of vitamins and minerals. Vegetables also provide the perfect balance of protein. Processed food has little nutritional value, so try to avoid this.  Medications  Resume taking all of your medications unless your doctor or Physician Assistnat tells you not to. If your incision is causing pain, you may take over-the-counter pain relievers such as acetaminophen  (Tylenol). If you were prescribed a stronger pain medication, please be aware these medications can cause nausea and constipation. Prevent nausea by taking the medication with a snack or meal. Avoid constipation by drinking plenty of fluids and eating foods with a high amount of fiber, such as fruits, vegetables, and grains. Do not take Tylenol if you are taking prescription pain medications.   Follow up  Our office will schedule a follow-up appointment with a C.T. scan 3-4 weeks after your surgery.  Please call us immediately for any of the following conditions  Severe or worsening pain in your legs or feet or in your abdomen back or chest. Increased pain, redness, drainage (pus) from your incision sit. Increased abdominal pain, bloating, nausea, vomiting or persistent diarrhea. Fever of 101 degrees or higher. Swelling in your leg (s),  Reduce your risk of vascular disease  Stop smoking. If you would like help call [facility] at 1-800-QUIT-NOW (646-194-0168) or [place] at [phone]. Manage your cholesterol Maintain a desired weight Control your diabetes Keep your blood pressure down  If you have questions, please call the office at [phone].    Prescriptions given: Oxycodone- Acetaminophen 5-325 mg #30 No Refill  Disposition: Skilled nursing facility  Patient's condition: is Fair  Follow up: 1. Dr. Chestine Spore in 3-4 weeks with CTA protocol  [name], PA-C [facility] [phone] 10/06/2022   - For VQI Registry use - Post-op:  Time to Extubation: [X]  In OR, [ ]  < 12 hrs, [ ]  12-24 hrs, [ ]  >=24 hrs Vasopressors Req. Post-op: Yes MI: No., [ ]  Troponin only, [ ]  EKG or Clinical New Arrhythmia: No CHF: No ICU Stay: 2 day in cardiac ICU Transfusion: Yes     If yes, 4 units given packed red blood cells, 2 units of fresh frozen plasma  Complications: Resp failure: No., [ ]  Pneumonia, [ ]  Ventilator Chg in renal function: No., [ ]  Inc. Cr  > 0.5, [ ]  Temp. Dialysis,  [ ]  Permanent dialysis Leg ischemia: No., no Surgery needed, [ ]  Yes, Surgery needed,  [ ]  Amputation Bowel ischemia: No., [ ]  Medical Rx, [ ]  Surgical Rx Wound complication: No., [ ]  Superficial separation/infection, [ ]  Return to OR Return to OR: No  Return to OR for bleeding: No Stroke: No., [ ]  Minor, [ ]  Major  Discharge medications: Statin use:  Yes  ASA use:  Yes  Plavix use:  No  Beta blocker use:  No  ARB use:  Yes ACEI use:  No CCB use:  No

## 2022-10-05 NOTE — Care Management Important Message (Signed)
Important Message  Patient Details  Name: Troy [name] MRN: [id number] Date of Birth: [birth year]   Medicare Important Message Given:  Yes     Renie Ora 10/05/2022, 11:28 AM

## 2022-10-05 NOTE — Progress Notes (Signed)
  Progress Note    10/05/2022 10:44 AM 9 Days Post-Op  Subjective: Complains of scrotal swelling  Vitals:   10/05/22 0746 10/05/22 0830  BP: (!) 162/82 (!) 145/62  Pulse: 98 72  Resp: 19 20  Temp: (!) 97.5 F (36.4 C)   SpO2: 96% 96%    Physical Exam: Awake alert and oriented Right groin incision healing well with surrounding hematoma  CBC    Component Value Date/Time   WBC 8.8 10/04/2022 0546   RBC 2.57 (L) 10/04/2022 0546   HGB 7.5 (L) 10/04/2022 0546   HGB 13.4 08/23/2021 0934   HCT 23.7 (L) 10/04/2022 0546   HCT 41.7 08/23/2021 0934   PLT 208 10/04/2022 0546   MCV 92.2 10/04/2022 0546   MCV 91 08/23/2021 0934   MCH 29.2 10/04/2022 0546   MCHC 31.6 10/04/2022 0546   RDW 15.9 (H) 10/04/2022 0546   RDW 14.8 08/23/2021 0934   LYMPHSABS 2.1 07/10/2022 2217   LYMPHSABS 1.5 08/23/2021 0934   MONOABS 0.7 07/10/2022 2217   EOSABS 0.2 07/10/2022 2217   EOSABS 0.2 08/23/2021 0934   BASOSABS 0.1 07/10/2022 2217   BASOSABS 0.1 08/23/2021 0934    BMET    Component Value Date/Time   NA 135 09/28/2022 0754   NA 142 03/29/2022 0843   K 4.4 09/28/2022 0754   CL 105 09/28/2022 0754   CO2 20 (L) 09/28/2022 0754   GLUCOSE 168 (H) 09/28/2022 0754   BUN 13 09/28/2022 0754   BUN 15 03/29/2022 0843   CREATININE 1.03 09/28/2022 0754   CALCIUM 7.9 (L) 09/28/2022 0754   GFRNONAA >60 09/28/2022 0754   GFRAA >60 12/14/2019 1514    INR    Component Value Date/Time   INR 1.5 (H) 10/05/2022 0130     Intake/Output Summary (Last 24 hours) at 10/05/2022 1044 Last data filed at 10/05/2022 0404 Gross per 24 hour  Intake 240 ml  Output 1250 ml  Net -1010 ml     Assessment:  82 y.o. male is s/p EVAR with open repair right common femoral artery  Plan: Discharged to SNF when bed available  Glinda Natzke C. Randie Heinz, MD [facility] [place] Office: [phone] Pager: [phone]  10/05/2022 10:44 AM

## 2022-10-05 NOTE — Progress Notes (Signed)
Mobility Specialist Progress Note:   10/05/22 1447  Mobility  Activity Ambulated with assistance in hallway  Level of Assistance Contact guard assist, steadying assist  Assistive Device Front wheel walker  Distance Ambulated (ft) 120 ft  Activity Response Tolerated well  Mobility Referral Yes  $Mobility charge 1 Mobility  Mobility Specialist Start Time (ACUTE ONLY) 1150  Mobility Specialist Stop Time (ACUTE ONLY) 1211  Mobility Specialist Time Calculation (min) (ACUTE ONLY) 21 min    Pre Mobility: 86 HR,  140/67 BP,  95% SpO2  Pt received in bed, agreeable to mobility. Mod I for bed mobility. Ambulated in hallway w/ RW and CG. C/o scrotal pain and recent onset of lower back pain after ambulating to the bathroom with NT earlier in the morning. Otherwise asymptomatic. Pt left in chair with call bell and all needs met.  [place]ante Earlene Plater Mobility Specialist Please contact via Special educational needs teacher or Rehab office at [phone]

## 2022-10-06 DIAGNOSIS — I5030 Unspecified diastolic (congestive) heart failure: Secondary | ICD-10-CM | POA: Diagnosis not present

## 2022-10-06 DIAGNOSIS — I714 Abdominal aortic aneurysm, without rupture, unspecified: Secondary | ICD-10-CM | POA: Diagnosis not present

## 2022-10-06 DIAGNOSIS — I10 Essential (primary) hypertension: Secondary | ICD-10-CM | POA: Diagnosis not present

## 2022-10-06 DIAGNOSIS — I2510 Atherosclerotic heart disease of native coronary artery without angina pectoris: Secondary | ICD-10-CM | POA: Diagnosis not present

## 2022-10-06 DIAGNOSIS — I4821 Permanent atrial fibrillation: Secondary | ICD-10-CM | POA: Diagnosis not present

## 2022-10-06 DIAGNOSIS — E119 Type 2 diabetes mellitus without complications: Secondary | ICD-10-CM | POA: Diagnosis not present

## 2022-10-06 DIAGNOSIS — Z87891 Personal history of nicotine dependence: Secondary | ICD-10-CM | POA: Diagnosis not present

## 2022-10-06 DIAGNOSIS — Z8679 Personal history of other diseases of the circulatory system: Secondary | ICD-10-CM | POA: Diagnosis not present

## 2022-10-06 DIAGNOSIS — I6522 Occlusion and stenosis of left carotid artery: Secondary | ICD-10-CM | POA: Diagnosis not present

## 2022-10-06 DIAGNOSIS — E109 Type 1 diabetes mellitus without complications: Secondary | ICD-10-CM | POA: Diagnosis not present

## 2022-10-06 DIAGNOSIS — N401 Enlarged prostate with lower urinary tract symptoms: Secondary | ICD-10-CM | POA: Diagnosis not present

## 2022-10-06 DIAGNOSIS — I25119 Atherosclerotic heart disease of native coronary artery with unspecified angina pectoris: Secondary | ICD-10-CM | POA: Diagnosis not present

## 2022-10-06 DIAGNOSIS — J3801 Paralysis of vocal cords and larynx, unilateral: Secondary | ICD-10-CM | POA: Diagnosis not present

## 2022-10-06 DIAGNOSIS — I110 Hypertensive heart disease with heart failure: Secondary | ICD-10-CM | POA: Diagnosis not present

## 2022-10-06 DIAGNOSIS — D649 Anemia, unspecified: Secondary | ICD-10-CM | POA: Diagnosis not present

## 2022-10-06 DIAGNOSIS — I272 Pulmonary hypertension, unspecified: Secondary | ICD-10-CM | POA: Diagnosis not present

## 2022-10-06 DIAGNOSIS — Z79899 Other long term (current) drug therapy: Secondary | ICD-10-CM | POA: Diagnosis not present

## 2022-10-06 DIAGNOSIS — E089 Diabetes mellitus due to underlying condition without complications: Secondary | ICD-10-CM | POA: Diagnosis not present

## 2022-10-06 DIAGNOSIS — I480 Paroxysmal atrial fibrillation: Secondary | ICD-10-CM | POA: Diagnosis not present

## 2022-10-06 DIAGNOSIS — Z8249 Family history of ischemic heart disease and other diseases of the circulatory system: Secondary | ICD-10-CM | POA: Diagnosis not present

## 2022-10-06 DIAGNOSIS — Z7901 Long term (current) use of anticoagulants: Secondary | ICD-10-CM | POA: Diagnosis not present

## 2022-10-06 DIAGNOSIS — I5032 Chronic diastolic (congestive) heart failure: Secondary | ICD-10-CM | POA: Diagnosis not present

## 2022-10-06 DIAGNOSIS — H409 Unspecified glaucoma: Secondary | ICD-10-CM | POA: Diagnosis not present

## 2022-10-06 DIAGNOSIS — E559 Vitamin D deficiency, unspecified: Secondary | ICD-10-CM | POA: Diagnosis not present

## 2022-10-06 DIAGNOSIS — J9611 Chronic respiratory failure with hypoxia: Secondary | ICD-10-CM | POA: Diagnosis not present

## 2022-10-06 DIAGNOSIS — R5381 Other malaise: Secondary | ICD-10-CM | POA: Diagnosis not present

## 2022-10-06 DIAGNOSIS — K219 Gastro-esophageal reflux disease without esophagitis: Secondary | ICD-10-CM | POA: Diagnosis not present

## 2022-10-06 DIAGNOSIS — Z8546 Personal history of malignant neoplasm of prostate: Secondary | ICD-10-CM | POA: Diagnosis not present

## 2022-10-06 DIAGNOSIS — R32 Unspecified urinary incontinence: Secondary | ICD-10-CM | POA: Diagnosis not present

## 2022-10-06 DIAGNOSIS — I739 Peripheral vascular disease, unspecified: Secondary | ICD-10-CM | POA: Diagnosis not present

## 2022-10-06 DIAGNOSIS — M6281 Muscle weakness (generalized): Secondary | ICD-10-CM | POA: Diagnosis not present

## 2022-10-06 DIAGNOSIS — R001 Bradycardia, unspecified: Secondary | ICD-10-CM | POA: Diagnosis not present

## 2022-10-06 LAB — GLUCOSE, CAPILLARY
Glucose-Capillary: 103 mg/dL — ABNORMAL HIGH (ref 70–99)
Glucose-Capillary: 102 mg/dL — ABNORMAL HIGH (ref 70–99)

## 2022-10-06 LAB — PROTIME-INR
INR: 1.6 — ABNORMAL HIGH (ref 0.8–1.2)
Prothrombin Time: 19.3 seconds — ABNORMAL HIGH (ref 11.4–15.2)

## 2022-10-06 NOTE — TOC Progression Note (Addendum)
Transition of Care [facility]) - Progression Note    Patient Details  Name: Troy [name] MRN: [id number] Date of Birth: 1940/05/24  [facility]) CM/SW Contact  Patrice Paradise, LCSW Phone Number: 10/06/2022, 9:40 AM  Clinical Narrative:     Pt's Berkley Harvey has been approved. Auth # [id number], re-auth date 10/09/2022.  CSW spoke with Revonda Standard at [facility] re[facility] and they can accept pt today.  TOC team will continue to assist with discharge planning needs.   Expected Discharge Plan: Skilled Nursing Facility Barriers to Discharge: English as a second language teacher, Continued Medical Work up, SNF Pending bed offer  Expected Discharge Plan and Services   Discharge Planning Services: CM Consult Post Acute Care Choice: Home Health Living arrangements for the past 2 months: Single Family Home Expected Discharge Date: 10/06/22                         HH Arranged: PT [facility]         Social Determinants of Health (SDOH) Interventions SDOH Screenings   Food Insecurity: No Food Insecurity (09/26/2022)  Housing: Low Risk  (09/26/2022)  Transportation Needs: No Transportation Needs (09/26/2022)  Utilities: Not At Risk (09/26/2022)  Tobacco Use: Medium Risk (09/28/2022)    Readmission Risk Interventions   Row Labels 05/07/2022   11:28 AM 11/07/2021   12:48 PM  Readmission Risk Prevention Plan   Section Header. No data exists in this row.    Transportation Screening   Complete Complete  Home Care Screening    Complete  Medication Review (RN CM)    Complete  Medication Review Oceanographer)   Complete   HRI or Home Care Consult   Complete   SW Recovery Care/Counseling Consult   Complete   Palliative Care Screening   Not Applicable   Skilled Nursing Facility   Complete

## 2022-10-06 NOTE — Plan of Care (Signed)
  Problem: Education: Goal: Ability to describe self-care measures that may prevent or decrease complications (Diabetes Survival Skills Education) will improve Outcome: Adequate for Discharge Goal: Individualized Educational Video(s) Outcome: Adequate for Discharge   Problem: Coping: Goal: Ability to adjust to condition or change in health will improve Outcome: Adequate for Discharge   Problem: Fluid Volume: Goal: Ability to maintain a balanced intake and output will improve Outcome: Adequate for Discharge   Problem: Health Behavior/Discharge Planning: Goal: Ability to identify and utilize available resources and services will improve Outcome: Adequate for Discharge Goal: Ability to manage health-related needs will improve Outcome: Adequate for Discharge   Problem: Tissue Perfusion: Goal: Adequacy of tissue perfusion will improve Outcome: Adequate for Discharge   Problem: Education: Goal: Knowledge of discharge needs will improve Outcome: Adequate for Discharge   Problem: Elimination: Goal: Will not experience complications related to bowel motility Outcome: Adequate for Discharge Goal: Will not experience complications related to urinary retention Outcome: Adequate for Discharge   Problem: Pain Managment: Goal: General experience of comfort will improve Outcome: Adequate for Discharge

## 2022-10-06 NOTE — Progress Notes (Addendum)
  Progress Note    10/06/2022 7:32 AM 10 Days Post-Op  Subjective:  still bothered by scrotal swelling    Vitals:   10/05/22 2320 10/06/22 0355  BP: (!) 140/53 (!) 138/52  Pulse: 64 (!) 59  Resp: 17 18  Temp: 99 F (37.2 C) 98.3 F (36.8 C)  SpO2: 96% 96%    Physical Exam: General: NAD Lungs:  nonlabored Incisions:  R groin incision intact with resolving surrounding hematoma  CBC    Component Value Date/Time   WBC 8.8 10/04/2022 0546   RBC 2.57 (L) 10/04/2022 0546   HGB 7.5 (L) 10/04/2022 0546   HGB 13.4 08/23/2021 0934   HCT 23.7 (L) 10/04/2022 0546   HCT 41.7 08/23/2021 0934   PLT 208 10/04/2022 0546   MCV 92.2 10/04/2022 0546   MCV 91 08/23/2021 0934   MCH 29.2 10/04/2022 0546   MCHC 31.6 10/04/2022 0546   RDW 15.9 (H) 10/04/2022 0546   RDW 14.8 08/23/2021 0934   LYMPHSABS 2.1 07/10/2022 2217   LYMPHSABS 1.5 08/23/2021 0934   MONOABS 0.7 07/10/2022 2217   EOSABS 0.2 07/10/2022 2217   EOSABS 0.2 08/23/2021 0934   BASOSABS 0.1 07/10/2022 2217   BASOSABS 0.1 08/23/2021 0934    BMET    Component Value Date/Time   NA 135 09/28/2022 0754   NA 142 03/29/2022 0843   K 4.4 09/28/2022 0754   CL 105 09/28/2022 0754   CO2 20 (L) 09/28/2022 0754   GLUCOSE 168 (H) 09/28/2022 0754   BUN 13 09/28/2022 0754   BUN 15 03/29/2022 0843   CREATININE 1.03 09/28/2022 0754   CALCIUM 7.9 (L) 09/28/2022 0754   GFRNONAA >60 09/28/2022 0754   GFRAA >60 12/14/2019 1514    INR    Component Value Date/Time   INR 1.6 (H) 10/06/2022 0104     Intake/Output Summary (Last 24 hours) at 10/06/2022 0732 Last data filed at 10/06/2022 1610 Gross per 24 hour  Intake 480 ml  Output 2200 ml  Net -1720 ml      Assessment/Plan:  82 y.o. male is 10 days post op, s/p: EVAR with open repair through R CFA   -R groin incision still intact and dry -Unfortunately still uncomfortable with scrotal edema, no significant changes on exam -Patient was arranged for d/c to SNF yesterday  but he is currently pending insurance auth. Hopeful for d/c to SNF today. D/c order has been updated   Troy Dubonnet, PA-C [facility] [phone] 10/06/2022 7:32 AM   I have independently examined patient and agree with PA assessment and plan above.   Troy Santino C. Randie Heinz, MD [facility] Office: [phone] Pager: [phone]

## 2022-10-06 NOTE — Progress Notes (Signed)
Patient given to [facility]. Transported by son, D/C paperwork and 1 script given to son.

## 2022-10-06 NOTE — TOC Transition Note (Signed)
Transition of Care [facility]) - CM/SW Discharge Note   Patient Details  Name: Troy [name] MRN: [id number] Date of Birth: March 13, 1941  Transition of Care [facility]) CM/SW Contact:  Patrice Paradise, LCSW Phone Number: 10/06/2022, 11:14 AM   Clinical Narrative:    Patient will DC to: [facility]? Anticipated DC date:?10/06/2022 Family notified:?Tim Transport by: son-Tim   Per MD patient ready for DC to [facility]. RN, patient, patient's family, and facility notified of DC. Discharge Summary sent to facility. RN given number for report  [phone]. DC packet on chart. Ambulance transport requested for patient.   CSW signing off.   Judd Lien, [place] [phone]    Final next level of care: Skilled Nursing Facility Barriers to Discharge: Barriers Resolved   Patient Goals and CMS Choice CMS Medicare.gov Compare Post Acute Care list provided to:: Patient Choice offered to / list presented to : Patient  Discharge Placement                Patient chooses bed at: [facility] Patient to be transferred to facility by: Son-Tim Name of family member notified: Son-Tim Patient and family notified of of transfer: 10/06/22  Discharge Plan and Services Additional resources added to the After Visit Summary for     Discharge Planning Services: CM Consult Post Acute Care Choice: Home Health                    HH Arranged: PT [facility]: [facility]        Social Determinants of Health (SDOH) Interventions SDOH Screenings   Food Insecurity: No Food Insecurity (09/26/2022)  Housing: Low Risk  (09/26/2022)  Transportation Needs: No Transportation Needs (09/26/2022)  Utilities: Not At Risk (09/26/2022)  Tobacco Use: Medium Risk (09/28/2022)     Readmission Risk Interventions   Row Labels 05/07/2022   11:28 AM 11/07/2021   12:48 PM  Readmission Risk Prevention Plan   Section Header. No data exists in this row.    Transportation Screening   Complete Complete   Home Care Screening    Complete  Medication Review (RN CM)    Complete  Medication Review [employer])   Complete   HRI or Home Care Consult   Complete   SW Recovery Care/Counseling Consult   Complete   Palliative Care Screening   Not Applicable   Skilled Nursing Facility   Complete

## 2022-10-08 DIAGNOSIS — Z7901 Long term (current) use of anticoagulants: Secondary | ICD-10-CM | POA: Diagnosis not present

## 2022-10-09 DIAGNOSIS — I714 Abdominal aortic aneurysm, without rupture, unspecified: Secondary | ICD-10-CM | POA: Diagnosis not present

## 2022-10-09 DIAGNOSIS — R5381 Other malaise: Secondary | ICD-10-CM | POA: Diagnosis not present

## 2022-10-10 ENCOUNTER — Other Ambulatory Visit: Payer: Self-pay

## 2022-10-10 ENCOUNTER — Encounter (HOSPITAL_COMMUNITY): Payer: Self-pay

## 2022-10-10 ENCOUNTER — Ambulatory Visit (HOSPITAL_COMMUNITY)
Admission: RE | Admit: 2022-10-10 | Discharge: 2022-10-10 | Disposition: A | Discharge status: Home / Self Care | Payer: Medicare HMO | Source: Ambulatory Visit | Attending: Cardiology | Admitting: Cardiology

## 2022-10-10 VITALS — BP 94/60 | HR 85 | Wt 200.0 lb

## 2022-10-10 DIAGNOSIS — I110 Hypertensive heart disease with heart failure: Secondary | ICD-10-CM | POA: Insufficient documentation

## 2022-10-10 DIAGNOSIS — Z87891 Personal history of nicotine dependence: Secondary | ICD-10-CM | POA: Insufficient documentation

## 2022-10-10 DIAGNOSIS — J3801 Paralysis of vocal cords and larynx, unilateral: Secondary | ICD-10-CM | POA: Insufficient documentation

## 2022-10-10 DIAGNOSIS — Z8679 Personal history of other diseases of the circulatory system: Secondary | ICD-10-CM | POA: Insufficient documentation

## 2022-10-10 DIAGNOSIS — I2510 Atherosclerotic heart disease of native coronary artery without angina pectoris: Secondary | ICD-10-CM | POA: Diagnosis not present

## 2022-10-10 DIAGNOSIS — K219 Gastro-esophageal reflux disease without esophagitis: Secondary | ICD-10-CM | POA: Diagnosis not present

## 2022-10-10 DIAGNOSIS — I25119 Atherosclerotic heart disease of native coronary artery with unspecified angina pectoris: Secondary | ICD-10-CM | POA: Insufficient documentation

## 2022-10-10 DIAGNOSIS — I272 Pulmonary hypertension, unspecified: Secondary | ICD-10-CM | POA: Diagnosis not present

## 2022-10-10 DIAGNOSIS — J9611 Chronic respiratory failure with hypoxia: Secondary | ICD-10-CM | POA: Diagnosis not present

## 2022-10-10 DIAGNOSIS — I739 Peripheral vascular disease, unspecified: Secondary | ICD-10-CM | POA: Diagnosis not present

## 2022-10-10 DIAGNOSIS — D649 Anemia, unspecified: Secondary | ICD-10-CM | POA: Diagnosis not present

## 2022-10-10 DIAGNOSIS — R001 Bradycardia, unspecified: Secondary | ICD-10-CM | POA: Diagnosis not present

## 2022-10-10 DIAGNOSIS — I714 Abdominal aortic aneurysm, without rupture, unspecified: Secondary | ICD-10-CM | POA: Diagnosis not present

## 2022-10-10 DIAGNOSIS — Z79899 Other long term (current) drug therapy: Secondary | ICD-10-CM | POA: Insufficient documentation

## 2022-10-10 DIAGNOSIS — I10 Essential (primary) hypertension: Secondary | ICD-10-CM | POA: Diagnosis not present

## 2022-10-10 DIAGNOSIS — Z7901 Long term (current) use of anticoagulants: Secondary | ICD-10-CM | POA: Diagnosis not present

## 2022-10-10 DIAGNOSIS — I7143 Infrarenal abdominal aortic aneurysm, without rupture: Secondary | ICD-10-CM

## 2022-10-10 DIAGNOSIS — I6522 Occlusion and stenosis of left carotid artery: Secondary | ICD-10-CM | POA: Diagnosis not present

## 2022-10-10 DIAGNOSIS — R32 Unspecified urinary incontinence: Secondary | ICD-10-CM | POA: Diagnosis not present

## 2022-10-10 DIAGNOSIS — I5032 Chronic diastolic (congestive) heart failure: Secondary | ICD-10-CM | POA: Diagnosis not present

## 2022-10-10 DIAGNOSIS — E089 Diabetes mellitus due to underlying condition without complications: Secondary | ICD-10-CM | POA: Diagnosis not present

## 2022-10-10 DIAGNOSIS — Z8249 Family history of ischemic heart disease and other diseases of the circulatory system: Secondary | ICD-10-CM | POA: Diagnosis not present

## 2022-10-10 DIAGNOSIS — I480 Paroxysmal atrial fibrillation: Secondary | ICD-10-CM | POA: Diagnosis not present

## 2022-10-10 DIAGNOSIS — I4821 Permanent atrial fibrillation: Secondary | ICD-10-CM | POA: Diagnosis not present

## 2022-10-10 DIAGNOSIS — N401 Enlarged prostate with lower urinary tract symptoms: Secondary | ICD-10-CM | POA: Diagnosis not present

## 2022-10-10 LAB — CBC
HCT: 31.5 % — ABNORMAL LOW (ref 39.0–52.0)
Hemoglobin: 9.8 g/dL — ABNORMAL LOW (ref 13.0–17.0)
MCH: 28.3 pg (ref 26.0–34.0)
MCHC: 31.1 g/dL (ref 30.0–36.0)
MCV: 91 fL (ref 80.0–100.0)
Platelets: 471 10*3/uL — ABNORMAL HIGH (ref 150–400)
RBC: 3.46 MIL/uL — ABNORMAL LOW (ref 4.22–5.81)
RDW: 15.9 % — ABNORMAL HIGH (ref 11.5–15.5)
WBC: 14.5 10*3/uL — ABNORMAL HIGH (ref 4.0–10.5)
nRBC: 0 % (ref 0.0–0.2)

## 2022-10-10 LAB — COMPREHENSIVE METABOLIC PANEL
ALT: 13 U/L (ref 0–44)
AST: 21 U/L (ref 15–41)
Albumin: 3.3 g/dL — ABNORMAL LOW (ref 3.5–5.0)
Alkaline Phosphatase: 86 U/L (ref 38–126)
Anion gap: 17 — ABNORMAL HIGH (ref 5–15)
BUN: 25 mg/dL — ABNORMAL HIGH (ref 8–23)
CO2: 22 mmol/L (ref 22–32)
Calcium: 9.2 mg/dL (ref 8.9–10.3)
Chloride: 97 mmol/L — ABNORMAL LOW (ref 98–111)
Creatinine, Ser: 2.03 mg/dL — ABNORMAL HIGH (ref 0.61–1.24)
GFR, Estimated: 33 mL/min — ABNORMAL LOW (ref >60)
Glucose, Bld: 120 mg/dL — ABNORMAL HIGH (ref 70–99)
Potassium: 5.1 mmol/L (ref 3.5–5.1)
Sodium: 136 mmol/L (ref 135–145)
Total Bilirubin: 1.4 mg/dL — ABNORMAL HIGH (ref 0.3–1.2)
Total Protein: 7.5 g/dL (ref 6.5–8.1)

## 2022-10-10 LAB — IRON AND TIBC
Iron: 31 ug/dL — ABNORMAL LOW (ref 45–182)
Saturation Ratios: 9 % — ABNORMAL LOW (ref 17.9–39.5)
TIBC: 333 ug/dL (ref 250–450)
UIBC: 302 ug/dL

## 2022-10-10 LAB — BRAIN NATRIURETIC PEPTIDE: B Natriuretic Peptide: 147.1 pg/mL — ABNORMAL HIGH (ref 0.0–100.0)

## 2022-10-10 LAB — FERRITIN: Ferritin: 144 ng/mL (ref 24–336)

## 2022-10-10 MED ORDER — PREDNISONE 50 MG PO TABS: ORAL_TABLET | ORAL | 0 refills | Status: DC

## 2022-10-10 MED ORDER — DIPHENHYDRAMINE HCL 50 MG PO CAPS: ORAL_CAPSULE | ORAL | 0 refills | Status: DC

## 2022-10-10 NOTE — Patient Instructions (Signed)
There has been no changes to your medications.  Labs done today, your results will be available in MyChart, we will contact you for abnormal readings.  Keep follow up with [name]    If you have any questions or concerns before your next appointment please send [place] a message through [facility] or call our office at [phone].    TO LEAVE A MESSAGE FOR THE NURSE SELECT OPTION 2, PLEASE LEAVE A MESSAGE INCLUDING: YOUR NAME DATE OF BIRTH CALL BACK NUMBER REASON FOR CALL**this is important as we prioritize the call backs  YOU WILL RECEIVE A CALL BACK THE SAME DAY AS LONG AS YOU CALL BEFORE 4:00 PM  At the [facility], you and your health needs are our priority. As part of our continuing mission to provide you with exceptional heart care, we have created designated Provider Care Teams. These Care Teams include your primary Cardiologist (physician) and Advanced Practice Providers (APPs- Physician Assistants and Nurse Practitioners) who all work together to provide you with the care you need, when you need it.   You may see any of the following providers on your designated Care Team at your next follow up: Dr Arvilla Meres Dr Marca Ancona Dr. Marcos Eke, NP Robbie Lis, [place] [facility] [place], [place] Brynda Peon, NP Karle Plumber, PharmD   Please be sure to bring in all your medications bottles to every appointment.    Thank you for choosing [facility]

## 2022-10-10 NOTE — Telephone Encounter (Signed)
Appt has been scheduled with [facility]

## 2022-10-10 NOTE — Progress Notes (Addendum)
PCP: Assunta Found, MD HF Cardiology: [name]  82 y.o. with history of permanent atrial fibrillation, CAD s/p CABG 2000, PAD, chronic diastolic CHF was referred to CHF clinic by Ronie Spies, PA, for evaluation of CHF and pulmonary hypertension.  Patient had CABG in 2000, last cath was in 2014 showing occluded SVG-OM, occluded SVG-PDA, occluded RCA, and atretic LIMA.  SVG-D was patent and native LAD was patent.  Patient had bilateral fem-pop bypasses, peripheral dopplers in 7/21 showed that the right fem-pop was occluded.  He is in permanent atrial fibrillation on warfarin. Echo in 10/21 showed normal LV systolic function with moderately dilated/mildly dysfunctional RV and PASP 75 mmHg.  In 12/21, he was admitted with PNA and treated with antibiotics.  CTA chest showed no PE, RML PNA, and bilateral hilar + mediastinal lymphadenopathy.  He was sent home from this admission on home oxygen.   In 1/22, RHC/LHC was done.  This showed patent LIMA-LAD and SVG-D, occluded SVG-OM and occluded SVG-RCA, 95% calcified proximal RCA stenosis, totally occluded PLV with collaterals (known from prior).  There was moderate pulmonary hypertension with optimized left and right heart filling pressures.  I reviewed the cath with interventional cardiology, would require atherectomy to treat the RCA.  We decided to manage medically initially.   He stopped Jardiance due to penile yeast infection.   Echo 4/23 showed EF 55-60%, mild LVH, RV mildly reduced.   Admitted 10/23 with hematuria and inability to urinate. Urology consulted, foley placed and discharged home. Seen in the ED a week later with hematuria. Urology consulted and arranged traction on foley to help assist with tamponade.   Echo 3/24 showed EF 60-65% with mildly D-shaped septum, mild RV dilation with mildly decreased systolic function, IVC dilated, PASP 45 mmHg.   Seen in ED 07/10/22 with CP. HR 30 when EMS arrived, asymptomatic. He was given IV atropine and 200  cc IVF bolus. HsTroponins x 3 negative, HR 50's. Live Zio 2 week placed 4/24.   Follow up 4/24, NYHA II volume stable.  Zio (5/24) showed continuous atrial fibrillation, average HR 60 bpm. Rare PVCs.  Recently seen 6/24 for preo-op assessment and cleared for EVAR for AAA. Also given clearance to hold coumadin 5 days prior to OR.   6/24 underwent EVAR with repair of right common femoral artery. Discharged to SNF.   He presents today for follow up post hospitalization.  Here w/ son-in-law. Continues rehab at [facility]. Feels he is making progress and able to complete rehab sessions and feels he is getting stronger but still struggling w/ nausea. Not eating much at the SNF. Doesn't like the food. Has subsequently lost wt. BP is low but no orthostatic hypotension. Denies bleeding    Labs (12/21): K 3.8, creatinine 0.83, hgb 9.5 Labs (1/22): K 3.8, creatinine 1.22, LDL 49 Labs (2/22): K 3.6, creatinine 0.86 Labs (10/22): K 3.7, creatinine 0.75, BNP 153 Labs (1/23): LDL 62, HDL 37 Labs (9/23): K 3.6, creatinine 0.9, hgb 11.4 Labs (10/23): K 3.5, creatinine 0.81 Labs (2/24): K 3.8, creatinine 1.02 Labs (3/24): K 3.6, creatinine 1.07  PMH: 1. Atrial fibrillation: Permanent.  2. CAD: CABG 2000.  - LHC (2014) with totally occluded RCA, totally occluded SVG-RCA, totally occluded SVG-OM, atretic LIMA, patent SVG-D.   - LHC (1/22): patent LIMA-LAD and SVG-D, occluded SVG-OM and occluded SVG-RCA, 60-70% calcified ostial RCA stenosis and 95% calcified proximal RCA stenosis, totally occluded PLV with collaterals (known from prior). 3. HTN 4. PAD: s/p bilateral fem-pop bypasses.   -  Peripheral arterial dopplers (7/21): Occluded right fem-pop, patent left fem-pop.  5. Type 2 diabetes 6. Prostate cancer: Treated with radiation. 7. PVCs 8. History of renal artery stenosis.  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  10. Chronic diastolic CHF: Echo (10/21) with EF 60-65%, mildly decreased  RV systolic function with moderate RV enlargement, severe biatrial enlargement, PASP 75 mmHg, dilated IVC.  V/Q scan 7/22 with no chronic PE.  - RHC (1/22): mean RA 6, PA 65/18 mean 35, mean PCWP 14, CI 2.71, PVR 3.7 WU.  - Echo (4/23): EF 55-60%, mild LVH, RV mildly reduced, severely elevated pulmonary artery systolic pressure 11. 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  - Abdominal US (8/22): 4.8 cm AAA - CT abd/pelvis (5/23): 4.8 cm AAA - CT abd/pelvis (2/24): 5 cm AAA 12. Carotid stenosis: Carotid dopplers (8/22) with 40-59% LICA stenosis.  - Carotid dopplers (9/23): 40-59% LICA stenosis.  13. BPH  SH: Lives in [place] with wife.  Retired.  Remote smoker (>20 years ago). No ETOH.   Family History  Problem Relation Age of Onset   Deep vein thrombosis Father    Lung cancer Sister    Diabetes Sister    Heart disease Sister        After age 73   Hyperlipidemia Sister    Hypertension Sister    Lung cancer Sister    Breast cancer Sister    Hypertension Mother    Diabetes Sister    Coronary artery disease Other        family hx of   Cancer Brother        "crab cancer"   Heart disease Brother    Heart attack Brother    Heart attack Daughter    Colon cancer Neg Hx    Stroke Neg Hx    ROS: All systems reviewed and negative except as per HPI.   Current Outpatient Medications  Medication Sig Dispense Refill   acetaminophen (TYLENOL) 325 MG tablet Take 2 tablets (650 mg total) by mouth every 6 (six) hours as needed for mild pain, fever or headache (or Fever >/= 101). 12 tablet 0   albuterol (VENTOLIN HFA) 108 (90 Base) MCG/ACT inhaler Inhale 2 puffs into the lungs every 6 (six) hours as needed for wheezing or shortness of breath. 1 each 2   aspirin 81 MG EC tablet Take 1 tablet (81 mg total) by mouth daily with breakfast. 30 tablet 3   atorvastatin (LIPITOR) 40 MG tablet Take 1 tablet (40 mg total) by mouth daily. 90 tablet 1   Cholecalciferol (VITAMIN D3) 50 MCG (2000 UT) capsule Take  2,000 Units by mouth daily.     dutasteride (AVODART) 0.5 MG capsule Take 1 capsule (0.5 mg total) by mouth daily. 30 capsule 11   ipratropium (ATROVENT) 0.03 % nasal spray Place 2 sprays into both nostrils 2 (two) times daily as needed for rhinitis.     isosorbide mononitrate (IMDUR) 60 MG 24 hr tablet Take 2 tablets (120 mg total) by mouth every evening. 180 tablet 3   latanoprost (XALATAN) 0.005 % ophthalmic solution Place 1 drop into both eyes at bedtime.     levocetirizine (XYZAL) 5 MG tablet Take 5 mg by mouth every evening.     losartan (COZAAR) 25 MG tablet Take 1 tablet (25 mg total) by mouth daily. 30 tablet 4   metFORMIN (GLUCOPHAGE) 500 MG tablet Take 500 mg by mouth 2 (two) times daily with a meal.  nitroGLYCERIN (NITROSTAT) 0.4 MG SL tablet Place 1 tablet (0.4 mg total) under the tongue every 5 (five) minutes x 3 doses as needed for chest pain. 25 tablet 3   oxyCODONE-acetaminophen (PERCOCET) 5-325 MG tablet Take 1 tablet by mouth every 6 (six) hours as needed for severe pain. 30 tablet 0   OXYGEN Inhale 3 L into the lungs at bedtime.     pantoprazole (PROTONIX) 40 MG tablet Take 1 tablet (40 mg total) by mouth daily. Take 30-60 min before first meal of the day     spironolactone (ALDACTONE) 25 MG tablet Take 1 tablet (25 mg total) by mouth daily. 90 tablet 3   tamsulosin (FLOMAX) 0.4 MG CAPS capsule Take 1 capsule (0.4 mg total) by mouth daily after supper. 90 capsule 3   torsemide (DEMADEX) 10 MG tablet Take 10-20 mg by mouth See admin instructions. Take 10 mg daily, may increase to 20 mg as needed for weight gain     triamcinolone cream (KENALOG) 0.1 % Apply 1 Application topically 2 (two) times daily.     warfarin (COUMADIN) 2 MG tablet Take 2 mg by mouth daily.     No current facility-administered medications for this encounter.   Wt Readings from Last 3 Encounters:  10/10/22 90.7 kg (200 lb)  10/06/22 95.5 kg (210 lb 8.6 oz)  09/17/22 95.5 kg (210 lb 8 oz)   BP 94/60    Pulse 85   Wt 90.7 kg (200 lb)   SpO2 95%   BMI 28.70 kg/m   PHYSICAL EXAM: General:  fatigued appearing. No respiratory difficulty HEENT: normal Neck: supple. no JVD. Carotids 2+ bilat; no bruits. No lymphadenopathy or thyromegaly appreciated. Cor: PMI nondisplaced. Regular rate & rhythm. No rubs, gallops or murmurs. Lungs: clear Abdomen: soft, nontender, mildly distended. No hepatosplenomegaly. No bruits or masses. Good bowel sounds. Extremities: no cyanosis, clubbing, rash, edema Neuro: alert & oriented x 3, cranial nerves grossly intact. moves all 4 extremities w/o difficulty. Affect pleasant.   Assessment/Plan: 1. Chronic diastolic CHF: Echo in 10/21 with EF 60-65%, mildly decreased RV systolic function with moderate RV enlargement, severe biatrial enlargement, PASP 75 mmHg, dilated IVC. After increasing diuretics, RHC in 1/22 showed normal filling pressures with moderate pulmonary hypertension. Echo 4/23 with EF 60-65%, mildly enlarged RV with mildly decreased RV function. Echo 3/24 showed EF 60-65% with mildly D-shaped septum, mild RV dilation with mildly decreased systolic function, IVC dilated, PASP 45 mmHg.  - NYHA class II. Euvolemic on exam and wt is down. Complains  of nausea and decreased PO intake at SNF (doesn't like the food). BP is soft. Suspect he may be a little dry. Will check CMP  - Continue spironolactone 25 mg daily.  - Continue torsemide 10 mg daily. May need to hold based on labs  - Continue losartan 25 mg daily. - No Jardiance due to yeast infection.  2. 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  Moderate PH on RHC in 1/22.  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  Less likely group 1 PH.  No chronic PEs on 7/22 V/Q scan. 3. Chronic hypoxemic respiratory failure: Remote smoker.  Hilar and mediastinal lymphadenopathy on chest CT, no reported ILD.  Recurrent aspiration PNA in setting of chronic right vocal cord paralysis.   Was on home oxygen but able to stop after more effective diuresis, now just uses oxygen at night.  - Should follow with pulmonary.  4. CAD: s/p CABG in 2000.  Cath in 1/22 showed patent LIMA-LAD and SVG-D but occluded SVG-OM and SVG-RCA.  There was 60-70% ostial RCA stenosis and 95% proximal RCA stenosis with heavy calcification.  Discussed with interventional, with normal EF plan was to proceed with initial medical management and proceed to PCI if chest pain worsened (PCI would require atherectomy). Chronic mild atypical chest pain.     - Continue ASA 81 and atorvastatin, good lipids 3/24 - Continue Imdur 120 mg daily for angina control.  - Off Toprol with bradycardia. 5. Atrial fibrillation: Permanent.  - On warfarin. PCP manages INR  6. PAD: Occluded right fem-pop bypass.  No claudication.  - Follows with [facility]. 7. Carotid stenosis: 40-59% LICA stenosis on 9/23 dopplers, followed at [facility].  8. AAA: 5 cm AAA on 2/24 abdominal CT.  - s/p EVAR  6/24 9. Incontinence: Follows closely with urology.  10. Bradycardia: Given IV atropine recently but EMS with HR 30's; he was asymptomatic. - Now off beta blocker - Zio (5/24) showed continuous atrial fibrillation, average HR 60 bpm. Rare PVCs. 11. Fatigue: check CBC and iron studies + CMP    F/u w/ [name] in 2 months   Tomeika Weinmann Sharol Harness PA-C  10/10/2022

## 2022-10-11 ENCOUNTER — Other Ambulatory Visit (HOSPITAL_COMMUNITY): Payer: Self-pay

## 2022-10-11 DIAGNOSIS — I5032 Chronic diastolic (congestive) heart failure: Secondary | ICD-10-CM

## 2022-10-12 ENCOUNTER — Telehealth (HOSPITAL_COMMUNITY): Payer: Self-pay | Admitting: Cardiology

## 2022-10-12 ENCOUNTER — Ambulatory Visit: Payer: Medicare HMO | Admitting: Urology

## 2022-10-12 NOTE — Addendum Note (Signed)
Addended by: Theresia Bough on: 10/12/2022 12:05 PM   Modules accepted: Orders

## 2022-10-12 NOTE — Telephone Encounter (Signed)
Larita Fife nurse with [facility] aware of changes. ([phone]) order faxed with details to ([phone]) ADD ON APPT 7/19 @ 0830 OR 1330-Amy with transportation to return call with best time

## 2022-10-12 NOTE — Telephone Encounter (Signed)
-----   Message from [facility] sent at 10/10/2022  5:09 PM EDT ----- Labs suggest dehydration. K upper limits of normal. Stop torsemide and spiro. Needs f/u labs in 1 wk and provider visit as well to reassess volume status. Work in w/ APP in 1 wk. Will need f/u CMP at visit.   Iabs also c/w IDA. Please schedule for IV iron infusion, Feraheme

## 2022-10-15 DIAGNOSIS — I10 Essential (primary) hypertension: Secondary | ICD-10-CM | POA: Diagnosis not present

## 2022-10-15 DIAGNOSIS — D649 Anemia, unspecified: Secondary | ICD-10-CM | POA: Diagnosis not present

## 2022-10-15 DIAGNOSIS — I110 Hypertensive heart disease with heart failure: Secondary | ICD-10-CM | POA: Diagnosis not present

## 2022-10-15 DIAGNOSIS — I2510 Atherosclerotic heart disease of native coronary artery without angina pectoris: Secondary | ICD-10-CM | POA: Diagnosis not present

## 2022-10-15 DIAGNOSIS — I714 Abdominal aortic aneurysm, without rupture, unspecified: Secondary | ICD-10-CM | POA: Diagnosis not present

## 2022-10-15 DIAGNOSIS — K219 Gastro-esophageal reflux disease without esophagitis: Secondary | ICD-10-CM | POA: Diagnosis not present

## 2022-10-15 DIAGNOSIS — E089 Diabetes mellitus due to underlying condition without complications: Secondary | ICD-10-CM | POA: Diagnosis not present

## 2022-10-15 DIAGNOSIS — I480 Paroxysmal atrial fibrillation: Secondary | ICD-10-CM | POA: Diagnosis not present

## 2022-10-15 DIAGNOSIS — N401 Enlarged prostate with lower urinary tract symptoms: Secondary | ICD-10-CM | POA: Diagnosis not present

## 2022-10-18 DIAGNOSIS — I2510 Atherosclerotic heart disease of native coronary artery without angina pectoris: Secondary | ICD-10-CM | POA: Diagnosis not present

## 2022-10-18 DIAGNOSIS — I110 Hypertensive heart disease with heart failure: Secondary | ICD-10-CM | POA: Diagnosis not present

## 2022-10-18 DIAGNOSIS — Z48812 Encounter for surgical aftercare following surgery on the circulatory system: Secondary | ICD-10-CM | POA: Diagnosis not present

## 2022-10-18 DIAGNOSIS — I5032 Chronic diastolic (congestive) heart failure: Secondary | ICD-10-CM | POA: Diagnosis not present

## 2022-10-18 DIAGNOSIS — J9611 Chronic respiratory failure with hypoxia: Secondary | ICD-10-CM | POA: Diagnosis not present

## 2022-10-18 DIAGNOSIS — N401 Enlarged prostate with lower urinary tract symptoms: Secondary | ICD-10-CM | POA: Diagnosis not present

## 2022-10-18 DIAGNOSIS — E1151 Type 2 diabetes mellitus with diabetic peripheral angiopathy without gangrene: Secondary | ICD-10-CM | POA: Diagnosis not present

## 2022-10-18 DIAGNOSIS — I272 Pulmonary hypertension, unspecified: Secondary | ICD-10-CM | POA: Diagnosis not present

## 2022-10-18 DIAGNOSIS — I480 Paroxysmal atrial fibrillation: Secondary | ICD-10-CM | POA: Diagnosis not present

## 2022-10-19 ENCOUNTER — Ambulatory Visit (HOSPITAL_COMMUNITY)
Admission: RE | Admit: 2022-10-19 | Discharge: 2022-10-19 | Disposition: A | Discharge status: Home / Self Care | Payer: Medicare HMO | Source: Ambulatory Visit | Attending: Family Medicine | Admitting: Family Medicine

## 2022-10-19 ENCOUNTER — Encounter (HOSPITAL_COMMUNITY): Payer: Self-pay

## 2022-10-19 ENCOUNTER — Telehealth (HOSPITAL_COMMUNITY): Payer: Self-pay | Admitting: *Deleted

## 2022-10-19 VITALS — BP 130/64 | HR 78 | Ht 70.0 in | Wt 214.6 lb

## 2022-10-19 DIAGNOSIS — Z923 Personal history of irradiation: Secondary | ICD-10-CM | POA: Insufficient documentation

## 2022-10-19 DIAGNOSIS — N179 Acute kidney failure, unspecified: Secondary | ICD-10-CM | POA: Diagnosis not present

## 2022-10-19 DIAGNOSIS — J3801 Paralysis of vocal cords and larynx, unilateral: Secondary | ICD-10-CM | POA: Diagnosis not present

## 2022-10-19 DIAGNOSIS — I4821 Permanent atrial fibrillation: Secondary | ICD-10-CM | POA: Insufficient documentation

## 2022-10-19 DIAGNOSIS — Z7901 Long term (current) use of anticoagulants: Secondary | ICD-10-CM | POA: Insufficient documentation

## 2022-10-19 DIAGNOSIS — I110 Hypertensive heart disease with heart failure: Secondary | ICD-10-CM | POA: Insufficient documentation

## 2022-10-19 DIAGNOSIS — I272 Pulmonary hypertension, unspecified: Secondary | ICD-10-CM | POA: Diagnosis not present

## 2022-10-19 DIAGNOSIS — I714 Abdominal aortic aneurysm, without rupture, unspecified: Secondary | ICD-10-CM | POA: Diagnosis not present

## 2022-10-19 DIAGNOSIS — I5032 Chronic diastolic (congestive) heart failure: Secondary | ICD-10-CM | POA: Diagnosis not present

## 2022-10-19 DIAGNOSIS — I25810 Atherosclerosis of coronary artery bypass graft(s) without angina pectoris: Secondary | ICD-10-CM | POA: Diagnosis not present

## 2022-10-19 DIAGNOSIS — R590 Localized enlarged lymph nodes: Secondary | ICD-10-CM | POA: Insufficient documentation

## 2022-10-19 DIAGNOSIS — E6609 Other obesity due to excess calories: Secondary | ICD-10-CM | POA: Diagnosis not present

## 2022-10-19 DIAGNOSIS — N401 Enlarged prostate with lower urinary tract symptoms: Secondary | ICD-10-CM | POA: Diagnosis not present

## 2022-10-19 DIAGNOSIS — Z6831 Body mass index (BMI) 31.0-31.9, adult: Secondary | ICD-10-CM | POA: Diagnosis not present

## 2022-10-19 DIAGNOSIS — J9611 Chronic respiratory failure with hypoxia: Secondary | ICD-10-CM | POA: Insufficient documentation

## 2022-10-19 DIAGNOSIS — I2510 Atherosclerotic heart disease of native coronary artery without angina pectoris: Secondary | ICD-10-CM | POA: Diagnosis not present

## 2022-10-19 DIAGNOSIS — Z8679 Personal history of other diseases of the circulatory system: Secondary | ICD-10-CM | POA: Diagnosis not present

## 2022-10-19 DIAGNOSIS — I25119 Atherosclerotic heart disease of native coronary artery with unspecified angina pectoris: Secondary | ICD-10-CM | POA: Diagnosis not present

## 2022-10-19 DIAGNOSIS — Z87891 Personal history of nicotine dependence: Secondary | ICD-10-CM | POA: Diagnosis not present

## 2022-10-19 DIAGNOSIS — I10 Essential (primary) hypertension: Secondary | ICD-10-CM | POA: Diagnosis not present

## 2022-10-19 LAB — BASIC METABOLIC PANEL
Anion gap: 8 (ref 5–15)
BUN: 11 mg/dL (ref 8–23)
CO2: 25 mmol/L (ref 22–32)
Calcium: 8.6 mg/dL — ABNORMAL LOW (ref 8.9–10.3)
Chloride: 108 mmol/L (ref 98–111)
Creatinine, Ser: 1.05 mg/dL (ref 0.61–1.24)
GFR, Estimated: >60 mL/min (ref >60)
Glucose, Bld: 143 mg/dL — ABNORMAL HIGH (ref 70–99)
Potassium: 4.6 mmol/L (ref 3.5–5.1)
Sodium: 141 mmol/L (ref 135–145)

## 2022-10-19 LAB — PROTIME-INR
INR: 2.4 — ABNORMAL HIGH (ref 0.8–1.2)
Prothrombin Time: 26.2 seconds — ABNORMAL HIGH (ref 11.4–15.2)

## 2022-10-19 MED ORDER — SPIRONOLACTONE 25 MG PO TABS
25.0000 mg | ORAL_TABLET | Freq: Every day | ORAL | 3 refills | Status: DC
Start: 2022-10-19 — End: 2023-06-05

## 2022-10-19 MED ORDER — TORSEMIDE 10 MG PO TABS
10.0000 mg | ORAL_TABLET | ORAL | 3 refills | Status: DC
Start: 2022-10-19 — End: 2022-12-11

## 2022-10-19 NOTE — Telephone Encounter (Signed)
Called patient and left message reminding him of appointment in [facility] at 1:30 later today. Asked patient to call us at [phone] if any questions or if he needs to re-schedule appointment.

## 2022-10-19 NOTE — Patient Instructions (Addendum)
Labs drawn today - will call you if abnormal. Will send copy of INR to Dr. Phillips Odor. Take Torsemide 10 mg today and Saturday. Then take Torsemide 10 mg every other day. Return to [facility] in 4 weeks - see below. Please call us at [phone] if any questions or concerns prior to your next visit.

## 2022-10-19 NOTE — Progress Notes (Addendum)
PCP: Assunta Found, MD HF Cardiology: [name]  82 y.o. with history of permanent atrial fibrillation, CAD s/p CABG 2000, PAD, chronic diastolic CHF was referred to CHF clinic by Ronie Spies, PA, for evaluation of CHF and pulmonary hypertension.  Patient had CABG in 2000, last cath was in 2014 showing occluded SVG-OM, occluded SVG-PDA, occluded RCA, and atretic LIMA.  SVG-D was patent and native LAD was patent.  Patient had bilateral fem-pop bypasses, peripheral dopplers in 7/21 showed that the right fem-pop was occluded.  He is in permanent atrial fibrillation on warfarin. Echo in 10/21 showed normal LV systolic function with moderately dilated/mildly dysfunctional RV and PASP 75 mmHg.  In 12/21, he was admitted with PNA and treated with antibiotics.  CTA chest showed no PE, RML PNA, and bilateral hilar + mediastinal lymphadenopathy.  He was sent home from this admission on home oxygen.   In 1/22, RHC/LHC was done.  This showed patent LIMA-LAD and SVG-D, occluded SVG-OM and occluded SVG-RCA, 95% calcified proximal RCA stenosis, totally occluded PLV with collaterals (known from prior).  There was moderate pulmonary hypertension with optimized left and right heart filling pressures.  I reviewed the cath with interventional cardiology, would require atherectomy to treat the RCA.  We decided to manage medically initially.   He stopped Jardiance due to penile yeast infection.   Echo 4/23 showed EF 55-60%, mild LVH, RV mildly reduced.   Admitted 10/23 with hematuria and inability to urinate. Urology consulted, foley placed and discharged home. Seen in the ED a week later with hematuria. Urology consulted and arranged traction on foley to help assist with tamponade.   Echo 3/24 showed EF 60-65% with mildly D-shaped septum, mild RV dilation with mildly decreased systolic function, IVC dilated, PASP 45 mmHg.   Seen in ED 07/10/22 with CP. HR 30 when EMS arrived, asymptomatic. He was given IV atropine and 200  cc IVF bolus. HsTroponins x 3 negative, HR 50's. Live Zio 2 week placed 4/24.   Follow up 4/24, NYHA II volume stable.  Zio (5/24) showed continuous atrial fibrillation, average HR 60 bpm. Rare PVCs.  Recently seen 6/24 for preo-op assessment and cleared for EVAR for AAA. Also given clearance to hold coumadin 5 days prior to OR.   6/24 underwent EVAR with repair of right common femoral artery. Discharged to SNF.   He was seen in the HF clinic 10/10/22. On exam he was dry and lab work showed worsening renal function. Cleda Daub and Torsemide stopped. Iron sats low. Recommended IV Iron.   Discharged to home from [facility] July 10th.    Today he returns for HF follow up with his son. Overall feeling ok. Having ongoing issues with urinary incontinence. Has had some scrotal edema but tells me that was due to trauma during his recent hospitalization.  Denies SOB/PND/Orthopnea. On 3 liters oxygen at night. Appetite improving. Drinking Power Aide daily. Uses electric scooter out of the home and a cane in the house. No fever or chills. Weight at home  207-208 pounds. Taking all medications and has been taking spironolactone. He said that no one told him to stop taking spironolactone when he discharged from the SNF. Starting HHPT next week. Has an Aide 4 hours 3 days a week.   Labs (12/21): K 3.8, creatinine 0.83, hgb 9.5 Labs (1/22): K 3.8, creatinine 1.22, LDL 49 Labs (2/22): K 3.6, creatinine 0.86 Labs (10/22): K 3.7, creatinine 0.75, BNP 153 Labs (1/23): LDL 62, HDL 37 Labs (9/23): K 3.6, creatinine  0.9, hgb 11.4 Labs (10/23): K 3.5, creatinine 0.81 Labs (2/24): K 3.8, creatinine 1.02 Labs (3/24): K 3.6, creatinine 1.07 Labs (10/10/22): Creatinine 2, K 5.1, Iron sat 9%   PMH: 1. Atrial fibrillation: Permanent.  2. CAD: CABG 2000.  - LHC (2014) with totally occluded RCA, totally occluded SVG-RCA, totally occluded SVG-OM, atretic LIMA, patent SVG-D.   - LHC (1/22): patent LIMA-LAD and SVG-D, occluded  SVG-OM and occluded SVG-RCA, 60-70% calcified ostial RCA stenosis and 95% calcified proximal RCA stenosis, totally occluded PLV with collaterals (known from prior). 3. HTN 4. PAD: s/p bilateral fem-pop bypasses.   - Peripheral arterial dopplers (7/21): Occluded right fem-pop, patent left fem-pop.  5. Type 2 diabetes 6. Prostate cancer: Treated with radiation. 7. PVCs 8. History of renal artery stenosis.  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  10. Chronic diastolic CHF: Echo (10/21) with EF 60-65%, mildly decreased RV systolic function with moderate RV enlargement, severe biatrial enlargement, PASP 75 mmHg, dilated IVC.  V/Q scan 7/22 with no chronic PE.  - RHC (1/22): mean RA 6, PA 65/18 mean 35, mean PCWP 14, CI 2.71, PVR 3.7 WU.  - Echo (4/23): EF 55-60%, mild LVH, RV mildly reduced, severely elevated pulmonary artery systolic pressure 11. 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  - Abdominal US (8/22): 4.8 cm AAA - CT abd/pelvis (5/23): 4.8 cm AAA - CT abd/pelvis (2/24): 5 cm AAA 12. Carotid stenosis: Carotid dopplers (8/22) with 40-59% LICA stenosis.  - Carotid dopplers (9/23): 40-59% LICA stenosis.  13. BPH  SH: Lives in [place] with wife.  Retired.  Remote smoker (>20 years ago). No ETOH.   Family History  Problem Relation Age of Onset   Deep vein thrombosis Father    Lung cancer Sister    Diabetes Sister    Heart disease Sister        After age 83   Hyperlipidemia Sister    Hypertension Sister    Lung cancer Sister    Breast cancer Sister    Hypertension Mother    Diabetes Sister    Coronary artery disease Other        family hx of   Cancer Brother        "crab cancer"   Heart disease Brother    Heart attack Brother    Heart attack Daughter    Colon cancer Neg Hx    Stroke Neg Hx    ROS: All systems reviewed and negative except as per HPI.   Current Outpatient Medications  Medication Sig Dispense Refill   acetaminophen (TYLENOL) 325 MG tablet Take  2 tablets (650 mg total) by mouth every 6 (six) hours as needed for mild pain, fever or headache (or Fever >/= 101). 12 tablet 0   albuterol (VENTOLIN HFA) 108 (90 Base) MCG/ACT inhaler Inhale 2 puffs into the lungs every 6 (six) hours as needed for wheezing or shortness of breath. 1 each 2   aspirin 81 MG EC tablet Take 1 tablet (81 mg total) by mouth daily with breakfast. 30 tablet 3   atorvastatin (LIPITOR) 40 MG tablet Take 1 tablet (40 mg total) by mouth daily. 90 tablet 1   Cholecalciferol (VITAMIN D3) 50 MCG (2000 UT) capsule Take 2,000 Units by mouth daily.     dutasteride (AVODART) 0.5 MG capsule Take 1 capsule (0.5 mg total) by mouth daily. 30 capsule 11   ipratropium (ATROVENT) 0.03 % nasal spray Place 2 sprays into both nostrils 2 (two) times  daily as needed for rhinitis.     isosorbide mononitrate (IMDUR) 60 MG 24 hr tablet Take 2 tablets (120 mg total) by mouth every evening. 180 tablet 3   latanoprost (XALATAN) 0.005 % ophthalmic solution Place 1 drop into both eyes at bedtime.     levocetirizine (XYZAL) 5 MG tablet Take 5 mg by mouth every evening.     losartan (COZAAR) 25 MG tablet Take 1 tablet (25 mg total) by mouth daily. 30 tablet 4   metFORMIN (GLUCOPHAGE) 500 MG tablet Take 500 mg by mouth 2 (two) times daily with a meal.     nitroGLYCERIN (NITROSTAT) 0.4 MG SL tablet Place 1 tablet (0.4 mg total) under the tongue every 5 (five) minutes x 3 doses as needed for chest pain. 25 tablet 3   oxyCODONE-acetaminophen (PERCOCET) 5-325 MG tablet Take 1 tablet by mouth every 6 (six) hours as needed for severe pain. 30 tablet 0   OXYGEN Inhale 3 L into the lungs at bedtime.     pantoprazole (PROTONIX) 40 MG tablet Take 1 tablet (40 mg total) by mouth daily. Take 30-60 min before first meal of the day     tamsulosin (FLOMAX) 0.4 MG CAPS capsule Take 1 capsule (0.4 mg total) by mouth daily after supper. 90 capsule 3   triamcinolone cream (KENALOG) 0.1 % Apply 1 Application topically 2 (two)  times daily.     warfarin (COUMADIN) 2 MG tablet Take 2 mg by mouth daily.     No current facility-administered medications for this encounter.   Wt Readings from Last 3 Encounters:  10/19/22 97.3 kg (214 lb 9.6 oz)  10/10/22 90.7 kg (200 lb)  10/06/22 95.5 kg (210 lb 8.6 oz)   BP 130/64   Pulse 78   Ht 5\' 10"  (1.778 m)   Wt 97.3 kg (214 lb 9.6 oz)   SpO2 94%   BMI 30.79 kg/m   PHYSICAL EXAM: General: Arrived in an electric scooter. No resp difficulty HEENT: normal Neck: supple. JVP 9-10 . Carotids 2+ bilat; no bruits. No lymphadenopathy or thryomegaly appreciated. Cor: PMI nondisplaced. Irregular rate & rhythm. No rubs, gallops or murmurs. Lungs: clear Abdomen: soft, nontender, nondistended. No hepatosplenomegaly. No bruits or masses. Good bowel sounds. Extremities: no cyanosis, clubbing, rash, R and LLE 1+ edema Neuro: alert & orientedx3, cranial nerves grossly intact. moves all 4 extremities w/o difficulty. Affect pleasant   Assessment/Plan: 1. Chronic diastolic CHF: Echo in 10/21 with EF 60-65%, mildly decreased RV systolic function with moderate RV enlargement, severe biatrial enlargement, PASP 75 mmHg, dilated IVC. After increasing diuretics, RHC in 1/22 showed normal filling pressures with moderate pulmonary hypertension. Echo 4/23 with EF 60-65%, mildly enlarged RV with mildly decreased RV function. Echo 3/24 showed EF 60-65% with mildly D-shaped septum, mild RV dilation with mildly decreased systolic function, IVC dilated, PASP 45 mmHg.  - NYHA II. Volume status trending up. Needs to get back on diuretics. Instructed to take torsemide 10 mg today and tomorrow then 10 mg every other day. - He has been taking spiro 25 mg daily. I will continue and check BMET today.  - No Jardiance due to yeast infection.  - Discussed avoiding Power Aide and limiting high sodium intake.  2. 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  Moderate PH on RHC in 1/22.  Suspect group 3 PH in setting of  suspected scarring from recurrent aspiration PNA.  Less likely group 1 PH.  No chronic PEs on 7/22 V/Q scan. 3. Chronic hypoxemic respiratory  failure: Remote smoker.  Hilar and mediastinal lymphadenopathy on chest CT, no reported ILD.  Recurrent aspiration PNA in setting of chronic right vocal cord paralysis.   - Stable on room air. Continue oxygen at night.  - Should follow with pulmonary.  4. CAD: s/p CABG in 2000.  Cath in 1/22 showed patent LIMA-LAD and SVG-D but occluded SVG-OM and SVG-RCA.  There was 60-70% ostial RCA stenosis and 95% proximal RCA stenosis with heavy calcification.  Discussed with interventional, with normal EF plan was to proceed with initial medical management and proceed to PCI if chest pain worsened (PCI would require atherectomy).  - no chest pain.    - Continue ASA 81 and atorvastatin, good lipids 3/24 - Continue Imdur 120 mg daily for angina control.  - Off Toprol with bradycardia. 5. Atrial fibrillation: Permanent.  - On warfarin. PCP manages INR . Check INR and sent PCP results.  6. PAD: Occluded right fem-pop bypass.  No claudication.  - Follows with [facility]. 7. Carotid stenosis: 40-59% LICA stenosis on 9/23 dopplers, followed at [facility].  8. AAA: 5 cm AAA on 2/24 abdominal CT.  - s/p EVAR  6/24 9. Incontinence: Follows closely with urology. Taking flomax.   10. Bradycardia: Given IV atropine recently but EMS with HR 30's; he was asymptomatic. - Now off beta blocker - Zio (5/24) showed continuous atrial fibrillation, average HR 60 bpm. Rare PVCs. 11. IDA -Iron sats low. IV Feraheme was recommended. Not sure he was given Iron at the SNF.  12. AKI  Check BMET today.   Follow up with APP to reassess volume in 3-4 weeks.  Follow up with Dr Shirlee Latch in September.  Greater than 50% of the (total minutes 30) visit spent in counseling/coordination of care regarding the above and this included chart review.    Timmothy Baranowski NP-C  10/19/2022

## 2022-10-25 DIAGNOSIS — I110 Hypertensive heart disease with heart failure: Secondary | ICD-10-CM | POA: Diagnosis not present

## 2022-10-25 DIAGNOSIS — I272 Pulmonary hypertension, unspecified: Secondary | ICD-10-CM | POA: Diagnosis not present

## 2022-10-25 DIAGNOSIS — I5032 Chronic diastolic (congestive) heart failure: Secondary | ICD-10-CM | POA: Diagnosis not present

## 2022-10-25 DIAGNOSIS — I2510 Atherosclerotic heart disease of native coronary artery without angina pectoris: Secondary | ICD-10-CM | POA: Diagnosis not present

## 2022-10-25 DIAGNOSIS — E1151 Type 2 diabetes mellitus with diabetic peripheral angiopathy without gangrene: Secondary | ICD-10-CM | POA: Diagnosis not present

## 2022-10-25 DIAGNOSIS — Z48812 Encounter for surgical aftercare following surgery on the circulatory system: Secondary | ICD-10-CM | POA: Diagnosis not present

## 2022-10-25 DIAGNOSIS — J9611 Chronic respiratory failure with hypoxia: Secondary | ICD-10-CM | POA: Diagnosis not present

## 2022-10-25 DIAGNOSIS — I480 Paroxysmal atrial fibrillation: Secondary | ICD-10-CM | POA: Diagnosis not present

## 2022-10-25 DIAGNOSIS — N401 Enlarged prostate with lower urinary tract symptoms: Secondary | ICD-10-CM | POA: Diagnosis not present

## 2022-10-26 ENCOUNTER — Ambulatory Visit (HOSPITAL_COMMUNITY)
Admission: RE | Admit: 2022-10-26 | Discharge: 2022-10-26 | Disposition: A | Discharge status: Home / Self Care | Payer: Medicare HMO | Source: Ambulatory Visit | Attending: Vascular Surgery | Admitting: Vascular Surgery

## 2022-10-26 DIAGNOSIS — I7143 Infrarenal abdominal aortic aneurysm, without rupture: Secondary | ICD-10-CM | POA: Diagnosis not present

## 2022-10-26 DIAGNOSIS — Q272 Other congenital malformations of renal artery: Secondary | ICD-10-CM | POA: Diagnosis not present

## 2022-10-26 DIAGNOSIS — I701 Atherosclerosis of renal artery: Secondary | ICD-10-CM | POA: Diagnosis not present

## 2022-10-26 DIAGNOSIS — N281 Cyst of kidney, acquired: Secondary | ICD-10-CM | POA: Diagnosis not present

## 2022-10-26 MED ORDER — IOHEXOL 350 MG/ML SOLN
100.0000 mL | Freq: Once | INTRAVENOUS | Status: AC | PRN
  Administered 2022-10-26: 100 mL via INTRAVENOUS

## 2022-10-29 NOTE — Progress Notes (Unsigned)
Patient name: Troy [name] MRN: [id number] DOB: 1940/07/04 Sex: male  REASON FOR CONSULT: Postop check after EVAR  HPI: Troy [name] is a 82 y.o. male, presents for postop check after EVAR.  He most recently underwent EVAR on 09/26/2022 for stent graft repair of a 7 cm saccular abdominal aortic aneurysm.  He did require right common femoral endarterectomy with bovine patch after failure of the Perclose devices.  Past Medical History:  Diagnosis Date   A-fib [facility])    AAA (abdominal aortic aneurysm) (HCC)    Followed by Dr. Tawanna Cooler Early   Arthritis    CAD (coronary artery disease)    a. CABG 2000.   Cancer [facility])    Prostate:  Radiation Tx   Carotid artery disease (HCC)    Chest pain    precordial. mild chronic .[name]... nonischemic   CHF (congestive heart failure) (HCC)    Chronic edema    Coronary artery disease    a. Nuclear, January, 2008, no ischemia b. 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 Arbie Cookey   Sinus bradycardia    Asymptomatic    Past Surgical History:  Procedure Laterality Date   ABDOMINAL AORTIC ENDOVASCULAR STENT GRAFT N/A 09/26/2022   Procedure: ABDOMINAL AORTIC ENDOVASCULAR STENT GRAFT;  Surgeon: Cephus Shelling, MD;  Location[facility] OR;  Service: Vascular;  Laterality: N/A;   AORTOGRAM  09/26/2022   Procedure: AORTOGRAM;  Surgeon: Cephus Shelling, MD;  Location: [facility] OR;  Service: Vascular;;   CARDIAC  CATHETERIZATION  09/26/2012   1/4 patent bypass (occluded SVG-PDA, SVG-OM, LIMA-LAD), SVG-diagonal patent and fills the diagonal and LAD, distal RCA occlusion with left to right collateralization, patent circumflex, LAD with no flow-limiting disease and antegrade flow competitively from SVG-diagonal; EF 60-65%   COLONOSCOPY  11/30/2009   [id number]. next TCS 11/2019   COLONOSCOPY WITH PROPOFOL N/A 12/18/2019   Procedure: COLONOSCOPY WITH PROPOFOL;  Surgeon: Dolores Frame, MD;  Location: [facility] ENDO SUITE;  Service: Gastroenterology;  Laterality: N/A;  1030   CORONARY ARTERY BYPASS GRAFT  2000   CYSTOSCOPY N/A 08/31/2021   Procedure: CYSTOSCOPY;  Surgeon: Malen Gauze, MD;  Location: [facility] ORS;  Service: Urology;  Laterality: N/A;  pt knows to arrive at 7:00   CYSTOSCOPY WITH FULGERATION N/A 03/22/2022   Procedure: CYSTOSCOPY WITH FULGERATION;  Surgeon: Malen Gauze, MD;  Location: [facility] ORS;  Service: Urology;  Laterality: N/A;   ENDARTERECTOMY FEMORAL Right 09/26/2022   Procedure: RIGHT COMMON FEMORAL ENDARTERECTOMY WITH BOVINE PATCH;  Surgeon: Cephus Shelling, MD;  Location: [facility] OR;  Service: Vascular;  Laterality: Right;   ESOPHAGOGASTRODUODENOSCOPY N/A 08/12/2013   IHK:[id number] peptic stricture with erosive refluxesophagitis - status post Maloney dilation. Hiatal hernia. Abnormalgastric mucosa. Deformity of the pyloric channel suggestive ofprior peptic ulcer disease. Duodenal bulbar diverticulum Statuspost  gastric biopsy. h.pylori   LEFT HEART CATHETERIZATION WITH CORONARY/GRAFT ANGIOGRAM N/A 09/26/2012   Procedure: LEFT HEART CATHETERIZATION WITH Isabel Caprice;  Surgeon: Kathleene Hazel, MD;  Location: [facility] CATH LAB;  Service: Cardiovascular;  Laterality: N/A;   MALONEY DILATION N/A 08/12/2013   Procedure: Elease Hashimoto DILATION;  Surgeon: Corbin Ade, MD;  Location: [facility];  Service: Endoscopy;  Laterality: N/A;   POLYPECTOMY  12/18/2019    Procedure: POLYPECTOMY;  Surgeon: Dolores Frame, MD;  Location: [facility];  Service: Gastroenterology;;  ascending colon polyp    PR VEIN BYPASS GRAFT,AORTO-FEM-POP Right 07/19/1999   PR VEIN BYPASS GRAFT,AORTO-FEM-POP Left 05/02/2006   RIGHT HEART CATH AND CORONARY/GRAFT ANGIOGRAPHY N/A 04/11/2020   Procedure: RIGHT HEART CATH AND CORONARY/GRAFT ANGIOGRAPHY;  Surgeon: Laurey Morale, MD;  [facility];  Service: Cardiovascular;  Laterality: N/A;   SAVORY DILATION N/A 08/12/2013   Procedure: SAVORY DILATION;  Surgeon: Corbin Ade, MD;  Location[facility];  Service: Endoscopy;  Laterality: N/A;   TRANSURETHRAL RESECTION OF BLADDER TUMOR N/A 08/31/2021   Procedure: TRANSURETHRAL RESECTION OF BLADDER TUMOR (TURBT);  Surgeon: Malen Gauze, MD;  Location: [facility] ORS;  Service: Urology;  Laterality: N/A;   ULTRASOUND GUIDANCE FOR VASCULAR ACCESS Bilateral 09/26/2022   Procedure: ULTRASOUND GUIDANCE FOR VASCULAR ACCESS;  Surgeon: Cephus Shelling, MD;  Location: [facility] OR;  Service: Vascular;  Laterality: Bilateral;   VASCULAR SURGERY     WRIST SURGERY     cyst removal    Family History  Problem Relation Age of Onset   Deep vein thrombosis Father    Lung cancer Sister    Diabetes Sister    Heart disease Sister        After age 17   Hyperlipidemia Sister    Hypertension Sister    Lung cancer Sister    Breast cancer Sister    Hypertension Mother    Diabetes Sister    Coronary artery disease Other        family hx of   Cancer Brother        "crab cancer"   Heart disease Brother    Heart attack Brother    Heart attack Daughter    Colon cancer Neg Hx    Stroke Neg Hx     SOCIAL HISTORY: Social History   Socioeconomic History   Marital status: Widowed    Spouse name: Not on file   Number of children: Not on file   Years of education: Not on file   Highest education level: Not on file  Occupational History   Not on file  Tobacco Use    Smoking status: Former    Current packs/day: 0.00    Average packs/day: 2.0 packs/day for 70.0 years (140.0 ttl pk-yrs)    Types: Cigarettes    Start date: 04/21/1948    Quit date: 04/21/2018    Years since quitting: 4.5   Smokeless tobacco: Never  Vaping Use   Vaping status: Never Used  Substance and Sexual Activity   Alcohol use: Yes    Alcohol/week: 0.0 - 2.0 standard drinks of alcohol    Comment: OCCASIONAL   Drug use: Never   Sexual activity: Not on file  Other Topics Concern   Not on file  Social History Narrative   Not on file   Social Determinants of Health   Financial Resource Strain: Not on file  Food Insecurity: No Food Insecurity (09/26/2022)   Hunger Vital Sign  Worried About Programme researcher, broadcasting/film/video in the Last Year: Never true    Ran Out of Food in the Last Year: Never true  Transportation Needs: No Transportation Needs (09/26/2022)   PRAPARE - Administrator, Civil Service (Medical): No    Lack of Transportation (Non-Medical): No  Physical Activity: Not on file  Stress: Not on file  Social Connections: Unknown (10/18/2021)   [facility]   Social Network    Social Network: Not on file  Intimate Partner Violence: Not At Risk (09/26/2022)   Humiliation, Afraid, Rape, and Kick questionnaire    Fear of Current or Ex-Partner: No    Emotionally Abused: No    Physically Abused: No    Sexually Abused: No    Allergies  Allergen Reactions   Niacin Itching and Rash    Burning sensation   Contrast Media [Iodinated Contrast Media] Nausea And Vomiting    Per patient vocal cords thick with increased phlegm    Gemtesa [Vibegron] Other (See Comments)    UTI    Iodine-131 Nausea And Vomiting   Jardiance [Empagliflozin] Other (See Comments)    UTI   Myrbetriq [Mirabegron] Other (See Comments)    UTI   Nsaids Other (See Comments)    Taking Coumadin    Current Outpatient Medications  Medication Sig Dispense Refill    acetaminophen (TYLENOL) 325 MG tablet Take 2 tablets (650 mg total) by mouth every 6 (six) hours as needed for mild pain, fever or headache (or Fever >/= 101). 12 tablet 0   albuterol (VENTOLIN HFA) 108 (90 Base) MCG/ACT inhaler Inhale 2 puffs into the lungs every 6 (six) hours as needed for wheezing or shortness of breath. 1 each 2   aspirin 81 MG EC tablet Take 1 tablet (81 mg total) by mouth daily with breakfast. 30 tablet 3   atorvastatin (LIPITOR) 40 MG tablet Take 1 tablet (40 mg total) by mouth daily. 90 tablet 1   Cholecalciferol (VITAMIN D3) 50 MCG (2000 UT) capsule Take 2,000 Units by mouth daily.     dutasteride (AVODART) 0.5 MG capsule Take 1 capsule (0.5 mg total) by mouth daily. 30 capsule 11   ipratropium (ATROVENT) 0.03 % nasal spray Place 2 sprays into both nostrils 2 (two) times daily as needed for rhinitis.     isosorbide mononitrate (IMDUR) 60 MG 24 hr tablet Take 2 tablets (120 mg total) by mouth every evening. 180 tablet 3   latanoprost (XALATAN) 0.005 % ophthalmic solution Place 1 drop into both eyes at bedtime.     levocetirizine (XYZAL) 5 MG tablet Take 5 mg by mouth every evening. (Patient not taking: Reported on 10/19/2022)     loratadine (CLARITIN) 10 MG tablet Take 10 mg by mouth daily.     losartan (COZAAR) 25 MG tablet Take 1 tablet (25 mg total) by mouth daily. 30 tablet 4   metFORMIN (GLUCOPHAGE) 500 MG tablet Take 500 mg by mouth 2 (two) times daily with a meal.     nitroGLYCERIN (NITROSTAT) 0.4 MG SL tablet Place 1 tablet (0.4 mg total) under the tongue every 5 (five) minutes x 3 doses as needed for chest pain. (Patient not taking: Reported on 10/19/2022) 25 tablet 3   oxyCODONE-acetaminophen (PERCOCET) 5-325 MG tablet Take 1 tablet by mouth every 6 (six) hours as needed for severe pain. 30 tablet 0   OXYGEN Inhale 3 L into the lungs at bedtime.     pantoprazole (PROTONIX) 40 MG  tablet Take 1 tablet (40 mg total) by mouth daily. Take 30-60 min before first meal of the  day     spironolactone (ALDACTONE) 25 MG tablet Take 1 tablet (25 mg total) by mouth daily. 90 tablet 3   tamsulosin (FLOMAX) 0.4 MG CAPS capsule Take 1 capsule (0.4 mg total) by mouth daily after supper. 90 capsule 3   torsemide (DEMADEX) 10 MG tablet Take 1 tablet (10 mg total) by mouth every other day. 45 tablet 3   triamcinolone cream (KENALOG) 0.1 % Apply 1 Application topically 2 (two) times daily. (Patient not taking: Reported on 10/19/2022)     warfarin (COUMADIN) 2 MG tablet Take 2 mg by mouth daily.     No current facility-administered medications for this visit.    REVIEW OF SYSTEMS:  [X]  denotes positive finding, [ ]  denotes negative finding Cardiac  Comments:  Chest pain or chest pressure: ***   Shortness of breath upon exertion:    Short of breath when lying flat:    Irregular heart rhythm:        Vascular    Pain in calf, thigh, or hip brought on by ambulation:    Pain in feet at night that wakes you up from your sleep:     Blood clot in your veins:    Leg swelling:         Pulmonary    Oxygen at home:    Productive cough:     Wheezing:         Neurologic    Sudden weakness in arms or legs:     Sudden numbness in arms or legs:     Sudden onset of difficulty speaking or slurred speech:    Temporary loss of vision in one eye:     Problems with dizziness:         Gastrointestinal    Blood in stool:     Vomited blood:         Genitourinary    Burning when urinating:     Blood in urine:        Psychiatric    Major depression:         Hematologic    Bleeding problems:    Problems with blood clotting too easily:        Skin    Rashes or ulcers:        Constitutional    Fever or chills:      PHYSICAL EXAM: There were no vitals filed for this visit.  GENERAL: The patient is a well-nourished male, in no acute distress. The vital signs are documented above. CARDIAC: There is a regular rate and rhythm.  VASCULAR: *** PULMONARY: There is good air  exchange bilaterally without wheezing or rales. ABDOMEN: Soft and non-tender with normal pitched bowel sounds.  MUSCULOSKELETAL: There are no major deformities or cyanosis. NEUROLOGIC: No focal weakness or paresthesias are detected. SKIN: There are no ulcers or rashes noted. PSYCHIATRIC: The patient has a normal affect.  DATA:   ***  Assessment/Plan:  ***   Cephus Shelling, MD [facility] Office: [phone]

## 2022-10-30 ENCOUNTER — Encounter: Payer: Self-pay | Admitting: Vascular Surgery

## 2022-10-30 ENCOUNTER — Ambulatory Visit (INDEPENDENT_AMBULATORY_CARE_PROVIDER_SITE_OTHER): Payer: Medicare HMO | Admitting: Vascular Surgery

## 2022-10-30 VITALS — BP 160/63 | HR 68 | Temp 97.5°F | Resp 16 | Ht 70.5 in | Wt 205.0 lb

## 2022-10-30 DIAGNOSIS — I7143 Infrarenal abdominal aortic aneurysm, without rupture: Secondary | ICD-10-CM

## 2022-10-31 ENCOUNTER — Other Ambulatory Visit: Payer: Self-pay

## 2022-10-31 DIAGNOSIS — I7143 Infrarenal abdominal aortic aneurysm, without rupture: Secondary | ICD-10-CM

## 2022-11-02 DIAGNOSIS — I5032 Chronic diastolic (congestive) heart failure: Secondary | ICD-10-CM | POA: Diagnosis not present

## 2022-11-02 DIAGNOSIS — N401 Enlarged prostate with lower urinary tract symptoms: Secondary | ICD-10-CM | POA: Diagnosis not present

## 2022-11-02 DIAGNOSIS — Z48812 Encounter for surgical aftercare following surgery on the circulatory system: Secondary | ICD-10-CM | POA: Diagnosis not present

## 2022-11-02 DIAGNOSIS — I272 Pulmonary hypertension, unspecified: Secondary | ICD-10-CM | POA: Diagnosis not present

## 2022-11-02 DIAGNOSIS — I110 Hypertensive heart disease with heart failure: Secondary | ICD-10-CM | POA: Diagnosis not present

## 2022-11-02 DIAGNOSIS — I2510 Atherosclerotic heart disease of native coronary artery without angina pectoris: Secondary | ICD-10-CM | POA: Diagnosis not present

## 2022-11-02 DIAGNOSIS — J9611 Chronic respiratory failure with hypoxia: Secondary | ICD-10-CM | POA: Diagnosis not present

## 2022-11-02 DIAGNOSIS — I480 Paroxysmal atrial fibrillation: Secondary | ICD-10-CM | POA: Diagnosis not present

## 2022-11-02 DIAGNOSIS — E1151 Type 2 diabetes mellitus with diabetic peripheral angiopathy without gangrene: Secondary | ICD-10-CM | POA: Diagnosis not present

## 2022-11-07 DIAGNOSIS — E1151 Type 2 diabetes mellitus with diabetic peripheral angiopathy without gangrene: Secondary | ICD-10-CM | POA: Diagnosis not present

## 2022-11-07 DIAGNOSIS — I272 Pulmonary hypertension, unspecified: Secondary | ICD-10-CM | POA: Diagnosis not present

## 2022-11-07 DIAGNOSIS — I2510 Atherosclerotic heart disease of native coronary artery without angina pectoris: Secondary | ICD-10-CM | POA: Diagnosis not present

## 2022-11-07 DIAGNOSIS — J9611 Chronic respiratory failure with hypoxia: Secondary | ICD-10-CM | POA: Diagnosis not present

## 2022-11-07 DIAGNOSIS — Z48812 Encounter for surgical aftercare following surgery on the circulatory system: Secondary | ICD-10-CM | POA: Diagnosis not present

## 2022-11-07 DIAGNOSIS — N401 Enlarged prostate with lower urinary tract symptoms: Secondary | ICD-10-CM | POA: Diagnosis not present

## 2022-11-07 DIAGNOSIS — I5032 Chronic diastolic (congestive) heart failure: Secondary | ICD-10-CM | POA: Diagnosis not present

## 2022-11-07 DIAGNOSIS — I480 Paroxysmal atrial fibrillation: Secondary | ICD-10-CM | POA: Diagnosis not present

## 2022-11-07 DIAGNOSIS — I110 Hypertensive heart disease with heart failure: Secondary | ICD-10-CM | POA: Diagnosis not present

## 2022-11-13 ENCOUNTER — Ambulatory Visit (HOSPITAL_COMMUNITY)
Admission: RE | Admit: 2022-11-13 | Discharge: 2022-11-13 | Disposition: A | Discharge status: Home / Self Care | Payer: Medicare HMO | Source: Ambulatory Visit | Attending: Cardiology | Admitting: Cardiology

## 2022-11-13 DIAGNOSIS — I5032 Chronic diastolic (congestive) heart failure: Secondary | ICD-10-CM | POA: Diagnosis not present

## 2022-11-13 MED ORDER — SODIUM CHLORIDE 0.9 % IV SOLN
510.0000 mg | Freq: Once | INTRAVENOUS | Status: AC
  Administered 2022-11-13: 510 mg via INTRAVENOUS
  Filled 2022-11-13: qty 510

## 2022-11-14 DIAGNOSIS — E1151 Type 2 diabetes mellitus with diabetic peripheral angiopathy without gangrene: Secondary | ICD-10-CM | POA: Diagnosis not present

## 2022-11-14 DIAGNOSIS — Z48812 Encounter for surgical aftercare following surgery on the circulatory system: Secondary | ICD-10-CM | POA: Diagnosis not present

## 2022-11-14 DIAGNOSIS — I480 Paroxysmal atrial fibrillation: Secondary | ICD-10-CM | POA: Diagnosis not present

## 2022-11-14 DIAGNOSIS — I110 Hypertensive heart disease with heart failure: Secondary | ICD-10-CM | POA: Diagnosis not present

## 2022-11-14 NOTE — Progress Notes (Signed)
PCP: Assunta Found, MD HF Cardiology: [name]  82 y.o. with history of permanent atrial fibrillation, CAD s/p CABG 2000, PAD, chronic diastolic CHF was referred to CHF clinic by Ronie Spies, PA, for evaluation of CHF and pulmonary hypertension.  Patient had CABG in 2000, last cath was in 2014 showing occluded SVG-OM, occluded SVG-PDA, occluded RCA, and atretic LIMA.  SVG-D was patent and native LAD was patent.  Patient had bilateral fem-pop bypasses, peripheral dopplers in 7/21 showed that the right fem-pop was occluded.  He is in permanent atrial fibrillation on warfarin. Echo in 10/21 showed normal LV systolic function with moderately dilated/mildly dysfunctional RV and PASP 75 mmHg.  In 12/21, he was admitted with PNA and treated with antibiotics.  CTA chest showed no PE, RML PNA, and bilateral hilar + mediastinal lymphadenopathy.  He was sent home from this admission on home oxygen.   In 1/22, RHC/LHC was done.  This showed patent LIMA-LAD and SVG-D, occluded SVG-OM and occluded SVG-RCA, 95% calcified proximal RCA stenosis, totally occluded PLV with collaterals (known from prior).  There was moderate pulmonary hypertension with optimized left and right heart filling pressures.  I reviewed the cath with interventional cardiology, would require atherectomy to treat the RCA.  We decided to manage medically initially.   He stopped Jardiance due to penile yeast infection.   Echo 4/23 showed EF 55-60%, mild LVH, RV mildly reduced.   Admitted 10/23 with hematuria and inability to urinate. Urology consulted, foley placed and discharged home. Seen in the ED a week later with hematuria. Urology consulted and arranged traction on foley to help assist with tamponade.   Echo 3/24 showed EF 60-65% with mildly D-shaped septum, mild RV dilation with mildly decreased systolic function, IVC dilated, PASP 45 mmHg.   Seen in ED 07/10/22 with CP. HR 30 when EMS arrived, asymptomatic. He was given IV atropine and 200  cc IVF bolus. HsTroponins x 3 negative, HR 50's. Live Zio 2 week placed 4/24.   Follow up 4/24, NYHA II volume stable.  Zio (5/24) showed continuous atrial fibrillation, average HR 60 bpm. Rare PVCs.  Recently seen 6/24 for preo-op assessment and cleared for EVAR for AAA. Also given clearance to hold coumadin 5 days prior to OR.   6/24 underwent EVAR with repair of right common femoral artery. Discharged to SNF.   He was seen in the HF clinic 10/10/22. On exam he was dry and lab work showed worsening renal function. Cleda Daub and Torsemide stopped. Iron sats low. Recommended IV Iron.   Discharged to home from [facility] 10/10/22.   Follow up 7/24, stable NYH II and he was mildly volume overloaded. Torsemide restarted.  Today he returns for HF follow up. Overall feeling fine. He is not SOB walking around his house. He uses a motorized scooter if he has to go further distances outside of his home. Denies palpitations, abnormal bleeding, CP, dizziness, edema, or PND/Orthopnea. Appetite ok. No fever or chills. Weight at home 200 pounds. Taking all medications. He has a HH aide 4 hrs/day, M-F. He completed PT. Wears 2-3L oxygen at night.  ReDs: 35%  Labs (12/21): K 3.8, creatinine 0.83, hgb 9.5 Labs (1/22): K 3.8, creatinine 1.22, LDL 49 Labs (2/22): K 3.6, creatinine 0.86 Labs (10/22): K 3.7, creatinine 0.75, BNP 153 Labs (1/23): LDL 62, HDL 37 Labs (9/23): K 3.6, creatinine 0.9, hgb 11.4 Labs (10/23): K 3.5, creatinine 0.81 Labs (2/24): K 3.8, creatinine 1.02 Labs (3/24): K 3.6, creatinine 1.07 Labs (7/24): K  5.1, creatinine 2, Iron sat 9%   PMH: 1. Atrial fibrillation: Permanent.  2. CAD: CABG 2000.  - LHC (2014) with totally occluded RCA, totally occluded SVG-RCA, totally occluded SVG-OM, atretic LIMA, patent SVG-D.   - LHC (1/22): patent LIMA-LAD and SVG-D, occluded SVG-OM and occluded SVG-RCA, 60-70% calcified ostial RCA stenosis and 95% calcified proximal RCA stenosis, totally occluded PLV  with collaterals (known from prior). 3. HTN 4. PAD: s/p bilateral fem-pop bypasses.   - Peripheral arterial dopplers (7/21): Occluded right fem-pop, patent left fem-pop.  5. Type 2 diabetes 6. Prostate cancer: Treated with radiation. 7. PVCs 8. History of renal artery stenosis.  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  10. Chronic diastolic CHF: Echo (10/21) with EF 60-65%, mildly decreased RV systolic function with moderate RV enlargement, severe biatrial enlargement, PASP 75 mmHg, dilated IVC.  V/Q scan 7/22 with no chronic PE.  - RHC (1/22): mean RA 6, PA 65/18 mean 35, mean PCWP 14, CI 2.71, PVR 3.7 WU.  - Echo (4/23): EF 55-60%, mild LVH, RV mildly reduced, severely elevated pulmonary artery systolic pressure 11. 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  - Abdominal US (8/22): 4.8 cm AAA - CT abd/pelvis (5/23): 4.8 cm AAA - CT abd/pelvis (2/24): 5 cm AAA 12. Carotid stenosis: Carotid dopplers (8/22) with 40-59% LICA stenosis.  - Carotid dopplers (9/23): 40-59% LICA stenosis.  13. BPH  SH: Lives in [place] with wife.  Retired.  Remote smoker (>20 years ago). No ETOH.   Family History  Problem Relation Age of Onset   Deep vein thrombosis Father    Lung cancer Sister    Diabetes Sister    Heart disease Sister        After age 6   Hyperlipidemia Sister    Hypertension Sister    Lung cancer Sister    Breast cancer Sister    Hypertension Mother    Diabetes Sister    Coronary artery disease Other        family hx of   Cancer Brother        "crab cancer"   Heart disease Brother    Heart attack Brother    Heart attack Daughter    Colon cancer Neg Hx    Stroke Neg Hx    ROS: All systems reviewed and negative except as per HPI.   Current Outpatient Medications  Medication Sig Dispense Refill   acetaminophen (TYLENOL) 325 MG tablet Take 2 tablets (650 mg total) by mouth every 6 (six) hours as needed for mild pain, fever or headache (or Fever >/= 101). 12 tablet 0    albuterol (VENTOLIN HFA) 108 (90 Base) MCG/ACT inhaler Inhale 2 puffs into the lungs every 6 (six) hours as needed for wheezing or shortness of breath. 1 each 2   aspirin 81 MG EC tablet Take 1 tablet (81 mg total) by mouth daily with breakfast. 30 tablet 3   atorvastatin (LIPITOR) 40 MG tablet Take 1 tablet (40 mg total) by mouth daily. 90 tablet 1   Cholecalciferol (VITAMIN D3) 50 MCG (2000 UT) capsule Take 2,000 Units by mouth daily.     dutasteride (AVODART) 0.5 MG capsule Take 1 capsule (0.5 mg total) by mouth daily. 30 capsule 11   ipratropium (ATROVENT) 0.03 % nasal spray Place 2 sprays into both nostrils 2 (two) times daily as needed for rhinitis.     isosorbide mononitrate (IMDUR) 60 MG 24 hr tablet Take 2 tablets (120 mg total) by  mouth every evening. 180 tablet 3   latanoprost (XALATAN) 0.005 % ophthalmic solution Place 1 drop into both eyes at bedtime.     levocetirizine (XYZAL) 5 MG tablet Take 5 mg by mouth every evening.     loratadine (CLARITIN) 10 MG tablet Take 10 mg by mouth daily.     losartan (COZAAR) 25 MG tablet Take 1 tablet (25 mg total) by mouth daily. 30 tablet 4   metFORMIN (GLUCOPHAGE) 500 MG tablet Take 500 mg by mouth 2 (two) times daily with a meal.     nitroGLYCERIN (NITROSTAT) 0.4 MG SL tablet Place 1 tablet (0.4 mg total) under the tongue every 5 (five) minutes x 3 doses as needed for chest pain. 25 tablet 3   oxyCODONE-acetaminophen (PERCOCET) 5-325 MG tablet Take 1 tablet by mouth every 6 (six) hours as needed for severe pain. 30 tablet 0   OXYGEN Inhale 3 L into the lungs at bedtime.     pantoprazole (PROTONIX) 40 MG tablet Take 1 tablet (40 mg total) by mouth daily. Take 30-60 min before first meal of the day     spironolactone (ALDACTONE) 25 MG tablet Take 1 tablet (25 mg total) by mouth daily. 90 tablet 3   tamsulosin (FLOMAX) 0.4 MG CAPS capsule Take 1 capsule (0.4 mg total) by mouth daily after supper. 90 capsule 3   torsemide (DEMADEX) 10 MG tablet  Take 1 tablet (10 mg total) by mouth every other day. 45 tablet 3   triamcinolone cream (KENALOG) 0.1 % Apply 1 Application topically 2 (two) times daily.     warfarin (COUMADIN) 2 MG tablet Take 2 mg by mouth daily.     No current facility-administered medications for this encounter.   Wt Readings from Last 3 Encounters:  11/16/22 92.2 kg (203 lb 3.2 oz)  11/13/22 90.6 kg (199 lb 12.8 oz)  10/30/22 93 kg (205 lb)   BP (!) 140/66   Pulse 70   Wt 92.2 kg (203 lb 3.2 oz)   SpO2 95%   BMI 28.74 kg/m   Physical Exam: General:  NAD. No resp difficulty, arrived to clinic in motorized scooter. HEENT: Normal Neck: Supple. No JVD. Carotids 2+ bilat; no bruits. No lymphadenopathy or thryomegaly appreciated. Cor: PMI nondisplaced. Irregular rate & rhythm. No rubs, gallops or murmurs. Lungs: Clear Abdomen: Soft, nontender, nondistended. No hepatosplenomegaly. No bruits or masses. Good bowel sounds. Extremities: No cyanosis, clubbing, rash, edema Neuro: Alert & oriented x 3, cranial nerves grossly intact. Moves all 4 extremities w/o difficulty. Affect pleasant.  Assessment/Plan: 1. Chronic diastolic CHF: Echo in 10/21 with EF 60-65%, mildly decreased RV systolic function with moderate RV enlargement, severe biatrial enlargement, PASP 75 mmHg, dilated IVC. After increasing diuretics, RHC in 1/22 showed normal filling pressures with moderate pulmonary hypertension. Echo 4/23 with EF 60-65%, mildly enlarged RV with mildly decreased RV function. Echo 3/24 showed EF 60-65% with mildly D-shaped septum, mild RV dilation with mildly decreased systolic function, IVC dilated, PASP 45 mmHg. NYHA II-IIb, he is not volume overloaded today. - Continue torsemide 10 mg every other day. BMET and BNP today. - Continue spiro 25 mg daily. - Continue losartan 25 mg daily. - No Jardiance due to yeast infection.  2. 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  Moderate PH on RHC in 1/22.  Suspect group 3 PH in setting of  suspected scarring from recurrent aspiration PNA.  Less likely group 1 PH.  No chronic PEs on 7/22 V/Q scan. 3. Chronic hypoxemic respiratory failure:  Remote smoker.  Hilar and mediastinal lymphadenopathy on chest CT, no reported ILD.  Recurrent aspiration PNA in setting of chronic right vocal cord paralysis.   - Stable on room air. Continue oxygen at night.  - Should follow with pulmonary.  4. CAD: s/p CABG in 2000.  Cath in 1/22 showed patent LIMA-LAD and SVG-D but occluded SVG-OM and SVG-RCA.  There was 60-70% ostial RCA stenosis and 95% proximal RCA stenosis with heavy calcification.  Discussed with interventional, with normal EF plan was to proceed with initial medical management and proceed to PCI if chest pain worsened (PCI would require atherectomy). No chest pain. - Continue ASA 81 and atorvastatin, good lipids 3/24 - Continue Imdur 120 mg daily for angina control.  - Off Toprol with bradycardia. 5. Atrial fibrillation: Permanent.  - On warfarin. PCP manages INR, check INR today and forward to PCP. 6. PAD: Occluded right fem-pop bypass.  No claudication.  - Follows with [facility]. 7. Carotid stenosis: 40-59% LICA stenosis on 9/23 dopplers, followed at [facility].  8. AAA: 5 cm AAA on 2/24 abdominal CT.  - s/p EVAR  6/24.  9. Incontinence: Follows closely with urology. On tamsulosin. 10. Bradycardia: Given IV atropine recently but EMS with HR 30's; he was asymptomatic. - Now off beta blocker - Zio (5/24) showed continuous atrial fibrillation, average HR 60 bpm. Rare PVCs. 11. IDA: Tsats low. He has received IV iron.  Follow up next month with [name], as scheduled.  [facility] FNP-BC  11/16/2022

## 2022-11-15 ENCOUNTER — Encounter (INDEPENDENT_AMBULATORY_CARE_PROVIDER_SITE_OTHER): Payer: Self-pay | Admitting: *Deleted

## 2022-11-16 ENCOUNTER — Ambulatory Visit (HOSPITAL_COMMUNITY): Admission: RE | Admit: 2022-11-16 | Payer: Medicare HMO | Source: Ambulatory Visit

## 2022-11-16 ENCOUNTER — Encounter (HOSPITAL_COMMUNITY): Payer: Self-pay

## 2022-11-16 VITALS — BP 140/66 | HR 70 | Wt 203.2 lb

## 2022-11-16 DIAGNOSIS — I272 Pulmonary hypertension, unspecified: Secondary | ICD-10-CM | POA: Diagnosis not present

## 2022-11-16 DIAGNOSIS — N401 Enlarged prostate with lower urinary tract symptoms: Secondary | ICD-10-CM | POA: Insufficient documentation

## 2022-11-16 DIAGNOSIS — I25119 Atherosclerotic heart disease of native coronary artery with unspecified angina pectoris: Secondary | ICD-10-CM | POA: Insufficient documentation

## 2022-11-16 DIAGNOSIS — Z8679 Personal history of other diseases of the circulatory system: Secondary | ICD-10-CM | POA: Insufficient documentation

## 2022-11-16 DIAGNOSIS — J9611 Chronic respiratory failure with hypoxia: Secondary | ICD-10-CM | POA: Insufficient documentation

## 2022-11-16 DIAGNOSIS — I714 Abdominal aortic aneurysm, without rupture, unspecified: Secondary | ICD-10-CM

## 2022-11-16 DIAGNOSIS — R32 Unspecified urinary incontinence: Secondary | ICD-10-CM

## 2022-11-16 DIAGNOSIS — I25810 Atherosclerosis of coronary artery bypass graft(s) without angina pectoris: Secondary | ICD-10-CM | POA: Insufficient documentation

## 2022-11-16 DIAGNOSIS — Z923 Personal history of irradiation: Secondary | ICD-10-CM | POA: Insufficient documentation

## 2022-11-16 DIAGNOSIS — I110 Hypertensive heart disease with heart failure: Secondary | ICD-10-CM | POA: Diagnosis not present

## 2022-11-16 DIAGNOSIS — R001 Bradycardia, unspecified: Secondary | ICD-10-CM | POA: Insufficient documentation

## 2022-11-16 DIAGNOSIS — R590 Localized enlarged lymph nodes: Secondary | ICD-10-CM | POA: Insufficient documentation

## 2022-11-16 DIAGNOSIS — J3801 Paralysis of vocal cords and larynx, unilateral: Secondary | ICD-10-CM | POA: Insufficient documentation

## 2022-11-16 DIAGNOSIS — D509 Iron deficiency anemia, unspecified: Secondary | ICD-10-CM

## 2022-11-16 DIAGNOSIS — I6529 Occlusion and stenosis of unspecified carotid artery: Secondary | ICD-10-CM | POA: Diagnosis not present

## 2022-11-16 DIAGNOSIS — Z79899 Other long term (current) drug therapy: Secondary | ICD-10-CM | POA: Insufficient documentation

## 2022-11-16 DIAGNOSIS — I493 Ventricular premature depolarization: Secondary | ICD-10-CM | POA: Diagnosis not present

## 2022-11-16 DIAGNOSIS — I2510 Atherosclerotic heart disease of native coronary artery without angina pectoris: Secondary | ICD-10-CM | POA: Diagnosis not present

## 2022-11-16 DIAGNOSIS — I4821 Permanent atrial fibrillation: Secondary | ICD-10-CM | POA: Insufficient documentation

## 2022-11-16 DIAGNOSIS — I739 Peripheral vascular disease, unspecified: Secondary | ICD-10-CM | POA: Diagnosis not present

## 2022-11-16 DIAGNOSIS — Z7901 Long term (current) use of anticoagulants: Secondary | ICD-10-CM | POA: Diagnosis not present

## 2022-11-16 DIAGNOSIS — Z87891 Personal history of nicotine dependence: Secondary | ICD-10-CM | POA: Insufficient documentation

## 2022-11-16 DIAGNOSIS — I5032 Chronic diastolic (congestive) heart failure: Secondary | ICD-10-CM | POA: Diagnosis not present

## 2022-11-16 LAB — PROTIME-INR
INR: 2.3 — ABNORMAL HIGH (ref 0.8–1.2)
Prothrombin Time: 25.8 seconds — ABNORMAL HIGH (ref 11.4–15.2)

## 2022-11-16 LAB — BASIC METABOLIC PANEL
Anion gap: 11 (ref 5–15)
BUN: 16 mg/dL (ref 8–23)
CO2: 26 mmol/L (ref 22–32)
Calcium: 8.9 mg/dL (ref 8.9–10.3)
Chloride: 99 mmol/L (ref 98–111)
Creatinine, Ser: 1.19 mg/dL (ref 0.61–1.24)
GFR, Estimated: >60 mL/min (ref >60)
Glucose, Bld: 95 mg/dL (ref 70–99)
Potassium: 4.3 mmol/L (ref 3.5–5.1)
Sodium: 136 mmol/L (ref 135–145)

## 2022-11-16 LAB — BRAIN NATRIURETIC PEPTIDE: B Natriuretic Peptide: 222.5 pg/mL — ABNORMAL HIGH (ref 0.0–100.0)

## 2022-11-16 NOTE — Progress Notes (Signed)
ReDS Vest / Clip - 11/16/22 1400       ReDS Vest / Clip   Station Marker C    Ruler Value 31    ReDS Value Range Low volume    ReDS Actual Value 35

## 2022-11-16 NOTE — Patient Instructions (Addendum)
Thank you for coming in today  If you had labs drawn today, any labs that are abnormal the clinic will call you No news is good news  Medications:no changes   Follow up appointments:  Your physician recommends that you schedule a follow-up appointment in:  Keep follow up with Dr Shirlee Latch   Do the following things EVERYDAY: Weigh yourself in the morning before breakfast. Write it down and keep it in a log. Take your medicines as prescribed Eat low salt foods--Limit salt (sodium) to 2000 mg per day.  Stay as active as you can everyday Limit all fluids for the day to less than 2 liters   At the [facility], you and your health needs are our priority. As part of our continuing mission to provide you with exceptional heart care, we have created designated Provider Care Teams. These Care Teams include your primary Cardiologist (physician) and Advanced Practice Providers (APPs- Physician Assistants and Nurse Practitioners) who all work together to provide you with the care you need, when you need it.   You may see any of the following providers on your designated Care Team at your next follow up: Dr Arvilla Meres Dr Marca Ancona Dr. Marcos Eke, NP Robbie Lis, [place] [facility] [place], [place] Brynda Peon, NP Karle Plumber, PharmD   Please be sure to bring in all your medications bottles to every appointment.    Thank you for choosing [facility]  If you have any questions or concerns before your next appointment please send [place] a message through [place] or call our office at [phone].    TO LEAVE A MESSAGE FOR THE NURSE SELECT OPTION 2, PLEASE LEAVE A MESSAGE INCLUDING: YOUR NAME DATE OF BIRTH CALL BACK NUMBER REASON FOR CALL**this is important as we prioritize the call backs  YOU WILL RECEIVE A CALL BACK THE SAME DAY AS LONG AS YOU CALL BEFORE 4:00 PM

## 2022-11-19 DIAGNOSIS — L603 Nail dystrophy: Secondary | ICD-10-CM | POA: Insufficient documentation

## 2022-11-19 DIAGNOSIS — K5730 Diverticulosis of large intestine without perforation or abscess without bleeding: Secondary | ICD-10-CM | POA: Insufficient documentation

## 2022-11-19 DIAGNOSIS — K222 Esophageal obstruction: Secondary | ICD-10-CM | POA: Insufficient documentation

## 2022-11-19 DIAGNOSIS — E6609 Other obesity due to excess calories: Secondary | ICD-10-CM | POA: Diagnosis not present

## 2022-11-19 DIAGNOSIS — N451 Epididymitis: Secondary | ICD-10-CM | POA: Diagnosis not present

## 2022-11-19 DIAGNOSIS — I10 Essential (primary) hypertension: Secondary | ICD-10-CM | POA: Insufficient documentation

## 2022-11-19 DIAGNOSIS — Z741 Need for assistance with personal care: Secondary | ICD-10-CM | POA: Insufficient documentation

## 2022-11-19 DIAGNOSIS — Z6830 Body mass index (BMI) 30.0-30.9, adult: Secondary | ICD-10-CM | POA: Diagnosis not present

## 2022-11-19 DIAGNOSIS — D126 Benign neoplasm of colon, unspecified: Secondary | ICD-10-CM | POA: Insufficient documentation

## 2022-11-19 NOTE — Progress Notes (Unsigned)
Name: Troy [name] DOB: 1940-09-24 MRN: [id number]  History of Present Illness: Troy [name] is a 82 y.o. male who presents today for follow up visit at [facility] [place]. - GU history: 1. Prostate cancer. Underwent EBRT and brachytherapy. 2. Posterior wall bladder tumor.  - Underwent TURBT on 08/31/2021 by Troy [name].  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  - Has required prior clot evacuation and fulgeration of multiple bladder and prostatic lesions. 4. Urge incontinence.  - Has failed multiple anticholinergics and beta 3 adrenergic agonists.  - Failed PTNS. - Urodynamics ordered on 08/15/2022. 5. Bilateral renal cysts.  At last visit with Troy [name] on 08/31/2022: Seen for right testicular pain. Treated with Doxycyline 100mg  BID for 14 days.   Since last visit: > 10/02/2022:  Scrotal ultrasound showed: - No acute abnormality in the testicles.  - Right hydrocele.  - Left epididymal and testicular cysts.  > 10/26/2022:  CT angio abdomen/pelvis w/o contrast showed: - Developing atrophy of the right kidney likely due to ischemia.  - Circumscribed simple cysts exophytic from the kidneys bilaterally again noted without interval change. No imaging follow-up.  - Indeterminate cyst exophytic from the interpolar left kidney measures 1 cm which is unchanged compared to prior imaging from 08/18/2021.  - Ureters and bladder were unremarkable.  Today: He reports since Saturday 11/17/2022 he has had severe right scrotal pain, swelling, tenderness, and some warmth. States the pain is radiating into penis. Denies redness. He states his PCP gave him a shot of IM Rocephin both yesterday and today. Denies fevers or nausea/vomiting. Also received IM Toradol today at PCP's office. He states he has leftover pain meds that he is taking as needed.   He denies any acute urinary changes. He states he is scheduled for urodynamic testing at [facility] next week for his  urinary incontinence.    Fall Screening: Do you usually have a device to assist in your mobility? Yes - walker   Medications: Current Outpatient Medications  Medication Sig Dispense Refill   doxycycline (VIBRAMYCIN) 100 MG capsule Take 1 capsule (100 mg total) by mouth every 12 (twelve) hours for 10 days. 20 capsule 0   acetaminophen (TYLENOL) 325 MG tablet Take 2 tablets (650 mg total) by mouth every 6 (six) hours as needed for mild pain, fever or headache (or Fever >/= 101). 12 tablet 0   albuterol (VENTOLIN HFA) 108 (90 Base) MCG/ACT inhaler Inhale 2 puffs into the lungs every 6 (six) hours as needed for wheezing or shortness of breath. 1 each 2   aspirin 81 MG EC tablet Take 1 tablet (81 mg total) by mouth daily with breakfast. 30 tablet 3   atorvastatin (LIPITOR) 40 MG tablet Take 1 tablet (40 mg total) by mouth daily. 90 tablet 1   Cholecalciferol (VITAMIN D3) 50 MCG (2000 UT) capsule Take 2,000 Units by mouth daily.     dutasteride (AVODART) 0.5 MG capsule Take 1 capsule (0.5 mg total) by mouth daily. 30 capsule 11   ipratropium (ATROVENT) 0.03 % nasal spray Place 2 sprays into both nostrils 2 (two) times daily as needed for rhinitis.     isosorbide mononitrate (IMDUR) 60 MG 24 hr tablet Take 2 tablets (120 mg total) by mouth every evening. 180 tablet 3   latanoprost (XALATAN) 0.005 % ophthalmic solution Place 1 drop into both eyes at bedtime.     levocetirizine (XYZAL) 5 MG tablet Take 5 mg by mouth every evening.  loratadine (CLARITIN) 10 MG tablet Take 10 mg by mouth daily.     losartan (COZAAR) 25 MG tablet Take 1 tablet (25 mg total) by mouth daily. 30 tablet 4   metFORMIN (GLUCOPHAGE) 500 MG tablet Take 500 mg by mouth 2 (two) times daily with a meal.     nitroGLYCERIN (NITROSTAT) 0.4 MG SL tablet Place 1 tablet (0.4 mg total) under the tongue every 5 (five) minutes x 3 doses as needed for chest pain. 25 tablet 3   oxyCODONE-acetaminophen (PERCOCET) 5-325 MG tablet Take 1  tablet by mouth every 6 (six) hours as needed for severe pain. 30 tablet 0   OXYGEN Inhale 3 L into the lungs at bedtime.     pantoprazole (PROTONIX) 40 MG tablet Take 1 tablet (40 mg total) by mouth daily. Take 30-60 min before first meal of the day     spironolactone (ALDACTONE) 25 MG tablet Take 1 tablet (25 mg total) by mouth daily. 90 tablet 3   tamsulosin (FLOMAX) 0.4 MG CAPS capsule Take 1 capsule (0.4 mg total) by mouth daily after supper. 90 capsule 3   torsemide (DEMADEX) 10 MG tablet Take 1 tablet (10 mg total) by mouth every other day. 45 tablet 3   triamcinolone cream (KENALOG) 0.1 % Apply 1 Application topically 2 (two) times daily.     warfarin (COUMADIN) 2 MG tablet Take 2 mg by mouth daily.     No current facility-administered medications for this visit.    Allergies: Allergies  Allergen Reactions   Niacin Itching and Rash    Burning sensation   Contrast Media [Iodinated Contrast Media] Nausea And Vomiting    Per patient vocal cords thick with increased phlegm    Gemtesa [Vibegron] Other (See Comments)    UTI    Iodine-131 Nausea And Vomiting   Jardiance [Empagliflozin] Other (See Comments)    UTI   Myrbetriq [Mirabegron] Other (See Comments)    UTI   Nsaids Other (See Comments)    Taking Coumadin    Past Medical History:  Diagnosis Date   A-fib (HCC)    AAA (abdominal aortic aneurysm) (HCC)    Followed by Dr. Tawanna Cooler Early   Arthritis    CAD (coronary artery disease)    a. CABG 2000.   Cancer [facility])    Prostate:  Radiation Tx   Carotid artery disease (HCC)    Chest pain    precordial. mild chronic .[name]... nonischemic   CHF (congestive heart failure) (HCC)    Chronic edema    Coronary artery disease    a. Nuclear, January, 2008, no ischemia b. 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 Arbie Cookey   Sinus bradycardia    Asymptomatic   Past Surgical History:  Procedure Laterality Date   ABDOMINAL AORTIC ENDOVASCULAR STENT GRAFT N/A 09/26/2022  Procedure: ABDOMINAL AORTIC ENDOVASCULAR STENT GRAFT;  Surgeon: Cephus Shelling, MD;  Location: [facility] OR;  Service: Vascular;  Laterality: N/A;   AORTOGRAM  09/26/2022   Procedure: AORTOGRAM;  Surgeon: Cephus Shelling, MD;  Location: [facility] OR;  Service: Vascular;;   CARDIAC CATHETERIZATION  09/26/2012   1/4 patent bypass (occluded SVG-PDA, SVG-OM, LIMA-LAD), SVG-diagonal patent and fills the diagonal and LAD, distal RCA occlusion with left to right collateralization, patent circumflex, LAD with no flow-limiting disease and antegrade flow competitively from SVG-diagonal; EF 60-65%   COLONOSCOPY  11/30/2009   UJW:[id number]. next TCS 11/2019   COLONOSCOPY WITH PROPOFOL N/A 12/18/2019   Procedure: COLONOSCOPY WITH PROPOFOL;  Surgeon: Dolores Frame, MD;  Location: [facility]O SUITE;  Service: Gastroenterology;  Laterality: N/A;  1030   CORONARY ARTERY BYPASS GRAFT  2000   CYSTOSCOPY N/A 08/31/2021   Procedure: CYSTOSCOPY;  Surgeon: Malen Gauze, MD;  Location: [facility] ORS;  Service: Urology;  Laterality: N/A;  pt knows to arrive at 7:00   CYSTOSCOPY WITH FULGERATION N/A 03/22/2022   Procedure: CYSTOSCOPY WITH FULGERATION;  Surgeon: Malen Gauze, MD;  Location: [facility] ORS;  Service: Urology;  Laterality: N/A;   ENDARTERECTOMY FEMORAL Right 09/26/2022   Procedure: RIGHT COMMON FEMORAL ENDARTERECTOMY WITH BOVINE PATCH;  Surgeon: Cephus Shelling,  MD;  Location: [facility] OR;  Service: Vascular;  Laterality: Right;   ESOPHAGOGASTRODUODENOSCOPY N/A 08/12/2013   YQM:[id number] peptic stricture with erosive refluxesophagitis - status post Maloney dilation. Hiatal hernia. Abnormalgastric mucosa. Deformity of the pyloric channel suggestive ofprior peptic ulcer disease. Duodenal bulbar diverticulum Statuspost gastric biopsy. h.pylori   LEFT HEART CATHETERIZATION WITH CORONARY/GRAFT ANGIOGRAM N/A 09/26/2012   Procedure: LEFT HEART CATHETERIZATION WITH Isabel Caprice;  Surgeon: Kathleene Hazel, MD;  Location: [facility] CATH LAB;  Service: Cardiovascular;  Laterality: N/A;   MALONEY DILATION N/A 08/12/2013   Procedure: Elease Hashimoto DILATION;  Surgeon: Corbin Ade, MD;  Location: [facility];  Service: Endoscopy;  Laterality: N/A;   POLYPECTOMY  12/18/2019   Procedure: POLYPECTOMY;  Surgeon: Dolores Frame, MD;  Location: [facility];  Service: Gastroenterology;;  ascending colon polyp    PR VEIN BYPASS GRAFT,AORTO-FEM-POP Right 07/19/1999   PR VEIN BYPASS GRAFT,AORTO-FEM-POP Left 05/02/2006   RIGHT HEART CATH AND CORONARY/GRAFT ANGIOGRAPHY N/A 04/11/2020   Procedure: RIGHT HEART CATH AND CORONARY/GRAFT ANGIOGRAPHY;  Surgeon: Laurey Morale, MD;  Location: [facility] INVASIVE CV LAB;  Service: Cardiovascular;  Laterality: N/A;   SAVORY DILATION N/A 08/12/2013   Procedure: SAVORY DILATION;  Surgeon: Corbin Ade, MD;  Location: [facility];  Service: Endoscopy;  Laterality: N/A;   TRANSURETHRAL RESECTION OF BLADDER TUMOR N/A 08/31/2021   Procedure: TRANSURETHRAL RESECTION OF BLADDER TUMOR (TURBT);  Surgeon: Malen Gauze, MD;  Location: [facility] ORS;  Service: Urology;  Laterality: N/A;   ULTRASOUND GUIDANCE FOR VASCULAR ACCESS Bilateral 09/26/2022   Procedure: ULTRASOUND GUIDANCE FOR VASCULAR ACCESS;  Surgeon: Cephus Shelling, MD;  Location: [facility] OR;  Service: Vascular;  Laterality: Bilateral;   VASCULAR SURGERY     WRIST  SURGERY     cyst removal   Family History  Problem Relation Age of Onset   Deep vein thrombosis Father    Lung cancer Sister    Diabetes Sister    Heart disease Sister        After age 71   Hyperlipidemia Sister    Hypertension Sister    Lung cancer Sister    Breast cancer Sister    Hypertension Mother  Diabetes Sister    Coronary artery disease Other        family hx of   Cancer Brother        "crab cancer"   Heart disease Brother    Heart attack Brother    Heart attack Daughter    Colon cancer Neg Hx    Stroke Neg Hx    Social History   Socioeconomic History   Marital status: Widowed    Spouse name: Not on file   Number of children: Not on file   Years of education: Not on file   Highest education level: Not on file  Occupational History   Not on file  Tobacco Use   Smoking status: Former    Current packs/day: 0.00    Average packs/day: 2.0 packs/day for 70.0 years (140.0 ttl pk-yrs)    Types: Cigarettes    Start date: 04/21/1948    Quit date: 04/21/2018    Years since quitting: 4.5   Smokeless tobacco: Never  Vaping Use   Vaping status: Never Used  Substance and Sexual Activity   Alcohol use: Yes    Alcohol/week: 0.0 - 2.0 standard drinks of alcohol    Comment: OCCASIONAL   Drug use: Never   Sexual activity: Not on file  Other Topics Concern   Not on file  Social History Narrative   Not on file   Social Determinants of Health   Financial Resource Strain: Not on file  Food Insecurity: No Food Insecurity (09/26/2022)   Hunger Vital Sign    Worried About Running Out of Food in the Last Year: Never true    Ran Out of Food in the Last Year: Never true  Transportation Needs: No Transportation Needs (09/26/2022)   PRAPARE - Administrator, Civil Service (Medical): No    Lack of Transportation (Non-Medical): No  Physical Activity: Not on file  Stress: Not on file  Social Connections: Unknown (10/18/2021)   [facility] from [facility]   Social Network    Social Network: Not on file  Intimate Partner Violence: Not At Risk (09/26/2022)   Humiliation, Afraid, Rape, and Kick questionnaire    Fear of Current or Ex-Partner: No    Emotionally Abused: No    Physically Abused: No    Sexually Abused: No    Review of Systems Constitutional: Patient denies any unintentional weight loss or change in strength lntegumentary: Patient denies any rashes or pruritus Cardiovascular: Patient denies chest pain or syncope Respiratory: Patient denies shortness of breath Gastrointestinal: Patient denies nausea, vomiting, constipation, or diarrhea Musculoskeletal: Patient denies muscle cramps or weakness Neurologic: Patient denies convulsions or seizures Psychiatric: Patient denies memory problems Allergic/Immunologic: Patient denies recent allergic reaction(s) Hematologic/Lymphatic: Patient denies bleeding tendencies Endocrine: Patient denies heat/cold intolerance  GU: As per HPI.  OBJECTIVE Vitals:   11/20/22 1209  BP: (!) 147/68  Pulse: 91  Temp: 98.2 F (36.8 C)   There is no height or weight on file to calculate BMI.  Physical Examination  Constitutional: Patient appears uncomfortable Cardiovascular: No visible lower extremity edema.  Respiratory: The patient does not have audible wheezing/stridor; respirations do not appear labored  Gastrointestinal: Abdomen non-distended Musculoskeletal: Normal ROM of UEs; has difficulty ambulating Neurologic: CN 2-12 grossly intact Psychiatric: Answered questions appropriately  Hematologic/Lymphatic/Immunologic: No obvious bruises or sites of spontaneous bleeding  Genitourinary: Penis appears slightly edematous circumferentially with no erythema, warmth, rash, lesions, or tenderness to palpation. Right hemiscrotum is indurated, edematous, and significantly tender  to palpation. No fluctuance, erythema, warmth, rash, lesions. Left hemiscrotum appears normal. Unable to palpate  testes or epididymis.  UA: positive for 11-30 WBC/hpf, 3-10 RBC/hpf, bacteria (few) PVR: 0 ml  ASSESSMENT Scrotal pain - Plan: doxycycline (VIBRAMYCIN) 100 MG capsule, US SCROTUM W/DOPPLER, CANCELED: US SCROTUM W/DOPPLER  Scrotal edema - Plan: doxycycline (VIBRAMYCIN) 100 MG capsule, US SCROTUM W/DOPPLER, CANCELED: US SCROTUM W/DOPPLER  Cellulitis, scrotum  Urge incontinence of urine - Plan: Urinalysis, Routine w reflex microscopic, BLADDER SCAN AMB NON-IMAGING  Malignant neoplasm of prostate (HCC)  We discussed suspected right hemiscrotal cellulitis and agreed to treat with Doxycycline 100 mg twice per day (which he has tolerated previously) for the next 10 days until his follow up appointment with Troy [name] on 11/30/2022. Low suspicion for torsion or any other acute pathology however due to the severity of his symptoms we agreed to proceed with repeat scrotal / testicular ultrasound. For pain management he was advised he can alternative OTC Tylenol with his leftover narcotic medications; advised to limit Tylenol (acetaminophen) to 4000 mg max per day. Cannot take NSAIDs. He was advised to go to the ER if He develops fever >100.5 F, uncontrollable pain, or other significantly concerning symptoms. Pt verbalized understanding and agreement. All questions were answered.  PLAN Advised the following: 1. Scrotal / testicular ultrasound. 2. Doxycycline 100 mg 2x/day x10 days. 3. Analgesics PRN.  4. Return in 10 days (on 11/30/2022) for as previously scheduled with Troy [name].  Orders Placed This Encounter  Procedures   US SCROTUM W/DOPPLER    Standing Status:   Future    Standing Expiration Date:   11/20/2023    Order Specific Question:   Reason for Exam (SYMPTOM  OR DIAGNOSIS REQUIRED)    Answer:   right scrotal edema and pain    Order Specific Question:   Preferred imaging location?    Answer:   [facility]   Urinalysis, Routine w reflex microscopic   BLADDER SCAN AMB  NON-IMAGING   It has been explained that the patient is to follow regularly with their PCP in addition to all other providers involved in their care and to follow instructions provided by these respective offices. Patient advised to contact urology clinic if any urologic-pertaining questions, concerns, new symptoms or problems arise in the interim period.  There are no Patient Instructions on file for this visit.  Electronically signed by:  Donnita Falls, FNP   11/20/22    12:47 PM

## 2022-11-20 ENCOUNTER — Encounter: Payer: Self-pay | Admitting: Urology

## 2022-11-20 ENCOUNTER — Ambulatory Visit (INDEPENDENT_AMBULATORY_CARE_PROVIDER_SITE_OTHER): Payer: No Typology Code available for payment source | Admitting: Urology

## 2022-11-20 VITALS — BP 147/68 | HR 91 | Temp 98.2°F

## 2022-11-20 DIAGNOSIS — N3941 Urge incontinence: Secondary | ICD-10-CM | POA: Diagnosis not present

## 2022-11-20 DIAGNOSIS — C61 Malignant neoplasm of prostate: Secondary | ICD-10-CM

## 2022-11-20 DIAGNOSIS — N452 Orchitis: Secondary | ICD-10-CM | POA: Diagnosis not present

## 2022-11-20 DIAGNOSIS — N492 Inflammatory disorders of scrotum: Secondary | ICD-10-CM

## 2022-11-20 DIAGNOSIS — N5082 Scrotal pain: Secondary | ICD-10-CM | POA: Diagnosis not present

## 2022-11-20 DIAGNOSIS — G894 Chronic pain syndrome: Secondary | ICD-10-CM | POA: Diagnosis not present

## 2022-11-20 DIAGNOSIS — N5089 Other specified disorders of the male genital organs: Secondary | ICD-10-CM

## 2022-11-20 DIAGNOSIS — R310 Gross hematuria: Secondary | ICD-10-CM

## 2022-11-20 LAB — MICROSCOPIC EXAMINATION

## 2022-11-20 LAB — URINALYSIS, ROUTINE W REFLEX MICROSCOPIC
Bilirubin, UA: NEGATIVE
Glucose, UA: NEGATIVE
Ketones, UA: NEGATIVE
Nitrite, UA: NEGATIVE
Specific Gravity, UA: <1.005 — ABNORMAL LOW (ref 1.005–1.030)
Urobilinogen, Ur: 2 mg/dL — ABNORMAL HIGH (ref 0.2–1.0)
pH, UA: 7.5 (ref 5.0–7.5)

## 2022-11-20 LAB — BLADDER SCAN AMB NON-IMAGING: Scan Result: 0

## 2022-11-20 MED ORDER — DOXYCYCLINE HYCLATE 100 MG PO CAPS
100.0000 mg | ORAL_CAPSULE | Freq: Two times a day (BID) | ORAL | 0 refills | Status: DC
Start: 2022-11-20 — End: 2022-12-11

## 2022-11-20 NOTE — Progress Notes (Signed)
post void residual=0 ?

## 2022-11-21 ENCOUNTER — Ambulatory Visit (HOSPITAL_COMMUNITY)
Admission: RE | Admit: 2022-11-21 | Discharge: 2022-11-21 | Disposition: A | Discharge status: Home / Self Care | Payer: No Typology Code available for payment source | Source: Ambulatory Visit | Attending: Urology | Admitting: Urology

## 2022-11-21 DIAGNOSIS — N5082 Scrotal pain: Secondary | ICD-10-CM | POA: Insufficient documentation

## 2022-11-21 DIAGNOSIS — N433 Hydrocele, unspecified: Secondary | ICD-10-CM | POA: Diagnosis not present

## 2022-11-21 DIAGNOSIS — N442 Benign cyst of testis: Secondary | ICD-10-CM | POA: Diagnosis not present

## 2022-11-21 DIAGNOSIS — N50811 Right testicular pain: Secondary | ICD-10-CM | POA: Diagnosis not present

## 2022-11-21 DIAGNOSIS — N5089 Other specified disorders of the male genital organs: Secondary | ICD-10-CM | POA: Insufficient documentation

## 2022-11-21 DIAGNOSIS — N503 Cyst of epididymis: Secondary | ICD-10-CM | POA: Diagnosis not present

## 2022-11-22 ENCOUNTER — Telehealth: Payer: Self-pay

## 2022-11-22 NOTE — Telephone Encounter (Signed)
-----   Message from Donnita Falls sent at 11/22/2022 12:49 PM EDT ----- Please let patient know scrotal / testicular ultrasound showed likely epididymitis. Advised to take Doxycycline as prescribed and follow up on 11/30/2022 with Dr. Ronne Binning as scheduled.

## 2022-11-22 NOTE — Progress Notes (Signed)
Please let patient know scrotal / testicular ultrasound showed likely epididymitis. Advised to take Doxycycline as prescribed and follow up on 11/30/2022 with Dr. Ronne Binning as scheduled.

## 2022-11-22 NOTE — Telephone Encounter (Signed)
Patient called and made aware.

## 2022-11-29 DIAGNOSIS — G894 Chronic pain syndrome: Secondary | ICD-10-CM | POA: Diagnosis not present

## 2022-11-29 DIAGNOSIS — Z6830 Body mass index (BMI) 30.0-30.9, adult: Secondary | ICD-10-CM | POA: Diagnosis not present

## 2022-11-29 DIAGNOSIS — E6609 Other obesity due to excess calories: Secondary | ICD-10-CM | POA: Diagnosis not present

## 2022-11-30 ENCOUNTER — Ambulatory Visit (INDEPENDENT_AMBULATORY_CARE_PROVIDER_SITE_OTHER): Payer: No Typology Code available for payment source | Admitting: Urology

## 2022-11-30 ENCOUNTER — Encounter: Payer: Self-pay | Admitting: Urology

## 2022-11-30 ENCOUNTER — Telehealth: Payer: Self-pay

## 2022-11-30 VITALS — BP 174/92 | HR 80

## 2022-11-30 DIAGNOSIS — N5082 Scrotal pain: Secondary | ICD-10-CM

## 2022-11-30 DIAGNOSIS — R310 Gross hematuria: Secondary | ICD-10-CM

## 2022-11-30 DIAGNOSIS — N3941 Urge incontinence: Secondary | ICD-10-CM

## 2022-11-30 LAB — URINALYSIS, ROUTINE W REFLEX MICROSCOPIC
Bilirubin, UA: NEGATIVE
Glucose, UA: NEGATIVE
Ketones, UA: NEGATIVE
Leukocytes,UA: NEGATIVE
Nitrite, UA: NEGATIVE
Protein,UA: NEGATIVE
Specific Gravity, UA: 1.01 (ref 1.005–1.030)
Urobilinogen, Ur: 0.2 mg/dL (ref 0.2–1.0)
pH, UA: 8 — ABNORMAL HIGH (ref 5.0–7.5)

## 2022-11-30 LAB — MICROSCOPIC EXAMINATION
Bacteria, UA: NONE SEEN
RBC, Urine: >30 /hpf — AB (ref 0–2)

## 2022-11-30 MED ORDER — CEFTRIAXONE SODIUM 1 G IJ SOLR
1.0000 g | Freq: Once | INTRAMUSCULAR | Status: AC
Start: 2022-11-30 — End: 2022-11-30
  Administered 2022-11-30: 1 g via INTRAMUSCULAR

## 2022-11-30 MED ORDER — BARD CUNNINGHAM INCONTIN CLAMP MISC: 1.0000 [IU] | Freq: Every day | 0 refills | Status: AC

## 2022-11-30 NOTE — Addendum Note (Signed)
Addended by: Christoper Fabian R on: 11/30/2022 12:41 PM   Modules accepted: Orders

## 2022-11-30 NOTE — Telephone Encounter (Signed)
Open in error

## 2022-11-30 NOTE — Progress Notes (Signed)
IM injection  Medication: Ceftriaxone Dose: 1gram Location: right hip Lot: [id number] Exp: [id number]  Patient tolerated well, no complications were noted  Performed by: Kennyth Lose, CMA

## 2022-11-30 NOTE — Patient Instructions (Signed)

## 2022-11-30 NOTE — Progress Notes (Signed)
11/30/2022 9:52 AM   Troy [name] 01-17-1941 [id number]  Referring provider: Assunta Found, MD [address] [place],  [place] [address]  Followup epididymitis   HPI: Troy [name] is a 81yo here for followup for epididymitis and urge incontinence. He could not have UDS due to a UTI. His epididymitis resolved with doxycycline. He has gross hematuria 1 month ago and a small amount yesterday. UA shows >30RBCs/hpf. His right testicular pain has improved. He is having nocturia 5x. He has urinary urgency and urge incontinent episode 3-5x per day.    PMH: Past Medical History:  Diagnosis Date   A-fib [facility])    AAA (abdominal aortic aneurysm) (HCC)    Followed by Dr. Tawanna Cooler Early   Arthritis    CAD (coronary artery disease)    a. CABG 2000.   Cancer [facility])    Prostate:  Radiation Tx   Carotid artery disease (HCC)    Chest pain    precordial. mild chronic .[name]... nonischemic   CHF (congestive heart failure) (HCC)    Chronic edema    Coronary artery disease    a. Nuclear, January, 2008, no ischemia b. 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 Arbie Cookey   Sinus bradycardia    Asymptomatic    Surgical History: Past Surgical History:  Procedure Laterality Date   ABDOMINAL AORTIC ENDOVASCULAR STENT GRAFT N/A 09/26/2022   Procedure: ABDOMINAL AORTIC ENDOVASCULAR STENT GRAFT;  Surgeon: Cephus Shelling, MD;  Location: [facility] OR;  Service: Vascular;   Laterality: N/A;   AORTOGRAM  09/26/2022   Procedure: AORTOGRAM;  Surgeon: Cephus Shelling, MD;  Location: [facility] OR;  Service: Vascular;;   CARDIAC CATHETERIZATION  09/26/2012   1/4 patent bypass (occluded SVG-PDA, SVG-OM, LIMA-LAD), SVG-diagonal patent and fills the diagonal and LAD, distal RCA occlusion with left to right collateralization, patent circumflex, LAD with no flow-limiting disease and antegrade flow competitively from SVG-diagonal; EF 60-65%   COLONOSCOPY  11/30/2009   ZYS:[id number]. next TCS 11/2019   COLONOSCOPY WITH PROPOFOL N/A 12/18/2019   Procedure: COLONOSCOPY WITH PROPOFOL;  Surgeon: Dolores Frame, MD;  Location: [facility] ENDO SUITE;  Service: Gastroenterology;  Laterality: N/A;  1030   CORONARY ARTERY BYPASS GRAFT  2000   CYSTOSCOPY N/A 08/31/2021   Procedure: CYSTOSCOPY;  Surgeon: Malen Gauze, MD;  Location: [facility] ORS;  Service: Urology;  Laterality: N/A;  pt knows to arrive at 7:00   CYSTOSCOPY WITH FULGERATION N/A 03/22/2022   Procedure: CYSTOSCOPY WITH FULGERATION;  Surgeon: Malen Gauze, MD;  Location: [facility] ORS;  Service: Urology;  Laterality: N/A;   ENDARTERECTOMY FEMORAL Right 09/26/2022   Procedure: RIGHT COMMON FEMORAL ENDARTERECTOMY WITH BOVINE PATCH;  Surgeon: Cephus Shelling, MD;  Location: [facility] OR;  Service: Vascular;  Laterality: Right;   ESOPHAGOGASTRODUODENOSCOPY N/A 08/12/2013   TDD:[id number] peptic stricture with  erosive refluxesophagitis - status post Maloney dilation. Hiatal hernia. Abnormalgastric mucosa. Deformity of the pyloric channel suggestive ofprior peptic ulcer disease. Duodenal bulbar diverticulum Statuspost gastric biopsy. h.pylori   LEFT HEART CATHETERIZATION WITH CORONARY/GRAFT ANGIOGRAM N/A 09/26/2012   Procedure: LEFT HEART CATHETERIZATION WITH Isabel Caprice;  Surgeon: Kathleene Hazel, MD;  Location: [facility] CATH LAB;  Service: Cardiovascular;  Laterality: N/A;   MALONEY DILATION N/A 08/12/2013    Procedure: Elease Hashimoto DILATION;  Surgeon: Corbin Ade, MD;  Location: [facility];  Service: Endoscopy;  Laterality: N/A;   POLYPECTOMY  12/18/2019   Procedure: POLYPECTOMY;  Surgeon: Dolores Frame, MD;  Location: [facility];  Service: Gastroenterology;;  ascending colon polyp    PR VEIN BYPASS GRAFT,AORTO-FEM-POP Right 07/19/1999   PR VEIN BYPASS GRAFT,AORTO-FEM-POP Left 05/02/2006   RIGHT HEART CATH AND CORONARY/GRAFT ANGIOGRAPHY N/A 04/11/2020   Procedure: RIGHT HEART CATH AND CORONARY/GRAFT ANGIOGRAPHY;  Surgeon: Laurey Morale, MD;  Location: [facility] INVASIVE CV LAB;  Service: Cardiovascular;  Laterality: N/A;   SAVORY DILATION N/A 08/12/2013   Procedure: SAVORY DILATION;  Surgeon: Corbin Ade, MD;  Location: [facility];  Service: Endoscopy;  Laterality: N/A;   TRANSURETHRAL RESECTION OF BLADDER TUMOR N/A 08/31/2021   Procedure: TRANSURETHRAL RESECTION OF BLADDER TUMOR (TURBT);  Surgeon: Malen Gauze, MD;  Location: [facility] ORS;  Service: Urology;  Laterality: N/A;   ULTRASOUND GUIDANCE FOR VASCULAR ACCESS Bilateral 09/26/2022   Procedure: ULTRASOUND GUIDANCE FOR VASCULAR ACCESS;  Surgeon: Cephus Shelling, MD;  Location: [facility] OR;  Service: Vascular;  Laterality: Bilateral;   VASCULAR SURGERY     WRIST SURGERY     cyst removal    Home Medications:  Allergies as of 11/30/2022       Reactions   Niacin Itching, Rash   Burning sensation   Contrast Media [iodinated Contrast Media] Nausea And Vomiting   Per patient vocal cords thick with increased phlegm    Gemtesa [vibegron] Other (See Comments)   UTI    Iodine-131 Nausea And Vomiting   Jardiance [empagliflozin] Other (See Comments)   UTI   Myrbetriq [mirabegron] Other (See Comments)   UTI   Nsaids Other (See Comments)   Taking Coumadin        Medication List        Accurate as of November 30, 2022  9:52 AM. If you have any questions, ask your nurse or doctor.          acetaminophen 325 MG  tablet Commonly known as: TYLENOL Take 2 tablets (650 mg total) by mouth every 6 (six) hours as needed for mild pain, fever or headache (or Fever >/= 101).   albuterol 108 (90 Base) MCG/ACT inhaler Commonly known as: VENTOLIN HFA Inhale 2 puffs into the lungs every 6 (six) hours as needed for wheezing or shortness of breath.   aspirin EC 81 MG tablet Take 1 tablet (81 mg total) by mouth daily with breakfast.   atorvastatin 40 MG tablet Commonly known as: LIPITOR Take 1 tablet (40 mg total) by mouth daily.   doxycycline 100 MG capsule Commonly known as: VIBRAMYCIN Take 1 capsule (100 mg total) by mouth every 12 (twelve) hours for 10 days.   dutasteride 0.5 MG capsule Commonly known as: AVODART Take 1 capsule (0.5 mg total) by mouth daily.   ipratropium 0.03 % nasal spray Commonly known as: ATROVENT Place 2 sprays into both nostrils 2 (two) times daily as needed for rhinitis.   isosorbide mononitrate 60 MG 24  hr tablet Commonly known as: IMDUR Take 2 tablets (120 mg total) by mouth every evening.   latanoprost 0.005 % ophthalmic solution Commonly known as: XALATAN Place 1 drop into both eyes at bedtime.   levocetirizine 5 MG tablet Commonly known as: XYZAL Take 5 mg by mouth every evening.   loratadine 10 MG tablet Commonly known as: CLARITIN Take 10 mg by mouth daily.   losartan 25 MG tablet Commonly known as: COZAAR Take 1 tablet (25 mg total) by mouth daily.   metFORMIN 500 MG tablet Commonly known as: GLUCOPHAGE Take 500 mg by mouth 2 (two) times daily with a meal.   nitroGLYCERIN 0.4 MG SL tablet Commonly known as: NITROSTAT Place 1 tablet (0.4 mg total) under the tongue every 5 (five) minutes x 3 doses as needed for chest pain.   oxyCODONE-acetaminophen 5-325 MG tablet Commonly known as: Percocet Take 1 tablet by mouth every 6 (six) hours as needed for severe pain.   OXYGEN Inhale 3 L into the lungs at bedtime.   pantoprazole 40 MG tablet Commonly  known as: PROTONIX Take 1 tablet (40 mg total) by mouth daily. Take 30-60 min before first meal of the day   spironolactone 25 MG tablet Commonly known as: ALDACTONE Take 1 tablet (25 mg total) by mouth daily.   tamsulosin 0.4 MG Caps capsule Commonly known as: FLOMAX Take 1 capsule (0.4 mg total) by mouth daily after supper.   torsemide 10 MG tablet Commonly known as: DEMADEX Take 1 tablet (10 mg total) by mouth every other day.   triamcinolone cream 0.1 % Commonly known as: KENALOG Apply 1 Application topically 2 (two) times daily.   Vitamin D3 50 MCG (2000 UT) capsule Take 2,000 Units by mouth daily.   warfarin 2 MG tablet Commonly known as: COUMADIN Take 2 mg by mouth daily.        Allergies:  Allergies  Allergen Reactions   Niacin Itching and Rash    Burning sensation   Contrast Media [Iodinated Contrast Media] Nausea And Vomiting    Per patient vocal cords thick with increased phlegm    Gemtesa [Vibegron] Other (See Comments)    UTI    Iodine-131 Nausea And Vomiting   Jardiance [Empagliflozin] Other (See Comments)    UTI   Myrbetriq [Mirabegron] Other (See Comments)    UTI   Nsaids Other (See Comments)    Taking Coumadin    Family History: Family History  Problem Relation Age of Onset   Deep vein thrombosis Father    Lung cancer Sister    Diabetes Sister    Heart disease Sister        After age 66   Hyperlipidemia Sister    Hypertension Sister    Lung cancer Sister    Breast cancer Sister    Hypertension Mother    Diabetes Sister    Coronary artery disease Other        family hx of   Cancer Brother        "crab cancer"   Heart disease Brother    Heart attack Brother    Heart attack Daughter    Colon cancer Neg Hx    Stroke Neg Hx     Social History:  reports that he quit smoking about 4 years ago. His smoking use included cigarettes. He started smoking about 74 years ago. He has a 140 pack-year smoking history. He has never used smokeless  tobacco. He reports current alcohol use. He reports that  he does not use drugs.  ROS: All other review of systems were reviewed and are negative except what is noted above in HPI  Physical Exam: BP (!) 174/92   Pulse 80   Constitutional:  Alert and oriented, No acute distress. HEENT: [place] AT, moist mucus membranes.  Trachea midline, no masses. Cardiovascular: No clubbing, cyanosis, or edema. Respiratory: Normal respiratory effort, no increased work of breathing. GI: Abdomen is soft, nontender, nondistended, no abdominal masses GU: No CVA tenderness.  Lymph: No cervical or inguinal lymphadenopathy. Skin: No rashes, bruises or suspicious lesions. Neurologic: Grossly intact, no focal deficits, moving all 4 extremities. Psychiatric: Normal mood and affect.  Laboratory Data: Lab Results  Component Value Date   WBC 14.5 (H) 10/10/2022   HGB 9.8 (L) 10/10/2022   HCT 31.5 (L) 10/10/2022   MCV 91.0 10/10/2022   PLT 471 (H) 10/10/2022    Lab Results  Component Value Date   CREATININE 1.19 11/16/2022    No results found for: "PSA"  No results found for: "TESTOSTERONE"  Lab Results  Component Value Date   HGBA1C 7.5 (H) 09/17/2022    Urinalysis    Component Value Date/Time   COLORURINE YELLOW 09/30/2022 1810   APPEARANCEUR Clear 11/20/2022 1252   LABSPEC 1.006 09/30/2022 1810   PHURINE 7.0 09/30/2022 1810   GLUCOSEU Negative 11/20/2022 1252   HGBUR SMALL (A) 09/30/2022 1810   BILIRUBINUR Negative 11/20/2022 1252   KETONESUR NEGATIVE 09/30/2022 1810   PROTEINUR Urine 11/20/2022 1252   PROTEINUR NEGATIVE 09/30/2022 1810   NITRITE Negative 11/20/2022 1252   NITRITE NEGATIVE 09/30/2022 1810   LEUKOCYTESUR 3+ (A) 11/20/2022 1252   LEUKOCYTESUR NEGATIVE 09/30/2022 1810    Lab Results  Component Value Date   LABMICR See below: 11/20/2022   WBCUA 11-30 (A) 11/20/2022   LABEPIT 0-10 11/20/2022   MUCUS Present 10/27/2021   BACTERIA Few (A) 11/20/2022    Pertinent  Imaging: Scrotal US 11/21/2022: Images reviewed and discussed with the patient  No results found for this or any previous visit.  No results found for this or any previous visit.  No results found for this or any previous visit.  No results found for this or any previous visit.  No results found for this or any previous visit.  No valid procedures specified. Results for orders placed during the hospital encounter of 08/18/21  CT HEMATURIA WORKUP  Narrative CLINICAL DATA:  Gross hematuria since early May, history of prostate cancer * Tracking Code: BO *  EXAM: CT ABDOMEN AND PELVIS WITHOUT AND WITH CONTRAST  TECHNIQUE: Multidetector CT imaging of the abdomen and pelvis was performed following the standard protocol before and following the bolus administration of intravenous contrast.  RADIATION DOSE REDUCTION: This exam was performed according to the departmental dose-optimization program which includes automated exposure control, adjustment of the mA and/or kV according to patient size and/or use of iterative reconstruction technique.  CONTRAST:  OMNIPAQUE IOHEXOL 300 MG/ML  SOLN  COMPARISON:  None Available.  FINDINGS: Lower chest: No acute abnormality. Cardiomegaly. Coronary artery calcifications.  Hepatobiliary: No solid liver abnormality is seen. Small, definitively benign cyst or hemangioma of the central liver dome for which no further follow-up or characterization is required (series 15, image 30). No gallstones, gallbladder wall thickening, or biliary dilatation.  Pancreas: Unremarkable. No pancreatic ductal dilatation or surrounding inflammatory changes.  Spleen: Normal in size without significant abnormality.  Adrenals/Urinary Tract: Adrenal glands are unremarkable. Small simple, benign bilateral renal cortical cysts, as well as  intrinsically hyperdense, nonenhancing hemorrhagic or proteinaceous cysts of the midportions of the left and right  kidneys (series 2, image 37). No urinary tract filling defect on delayed phase imaging. Bladder is unremarkable.  Stomach/Bowel: Stomach is within normal limits. Appendix appears normal. No evidence of bowel wall thickening, distention, or inflammatory changes. Descending and sigmoid diverticulosis.  Vascular/Lymphatic: Aortic atherosclerosis. 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 No enlarged abdominal or pelvic lymph nodes.  Reproductive: Prostate brachytherapy.  Other: Small, fat containing bilateral inguinal hernias. No ascites.  Musculoskeletal: No acute or significant osseous findings.  IMPRESSION: 1. No evidence of urinary tract mass, calculus, or hydronephrosis. No urinary tract filling defect on delayed phase imaging. 2. Prostate brachytherapy. No evidence of mass or lymphadenopathy in the abdomen or pelvis. 3. Small simple, benign bilateral renal cortical cysts, as well as nonenhancing, benign hemorrhagic or proteinaceous cysts. No further follow-up or characterization is required for these benign renal cysts. 4. 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 Recommend referral to a vascular specialist. This recommendation follows ACR consensus guidelines: White Paper of the ACR Incidental Findings Committee II on Vascular Findings. J Am Coll Radiol 2013; 10:789-794. 5. Cardiomegaly and coronary artery disease.  Aortic Atherosclerosis  (ICD10-I70.0).   Electronically Signed By: Jearld Lesch M.D. On: 08/19/2021 11:29  No results found for this or any previous visit.   Assessment & Plan:    1. Scrotal pain -imporved after doxycycline - Urinalysis, Routine w reflex microscopic  2. Urge incontinence of urine -Schedule for UDS. We will also trial a cunningham clamp  3. Gross hematuria -urine for culture   No follow-ups on file.  Wilkie Aye, MD  [facility] [place]

## 2022-12-05 ENCOUNTER — Telehealth: Payer: Self-pay

## 2022-12-05 NOTE — Telephone Encounter (Signed)
Patient called to notify staff that the cunningham clamp rx, [facility] does not carry.  Patient will need the clamp sent to a catheter supply company.

## 2022-12-06 NOTE — Telephone Encounter (Signed)
Completed [employer] form

## 2022-12-07 NOTE — Telephone Encounter (Signed)
Patient calling back to see if the clamp has been ordered and status.  Please advise.  Call:  [phone]

## 2022-12-07 NOTE — Telephone Encounter (Signed)
Patient is made aware and voiced understanding. 

## 2022-12-10 ENCOUNTER — Telehealth: Payer: Self-pay | Admitting: Urology

## 2022-12-10 DIAGNOSIS — N5082 Scrotal pain: Secondary | ICD-10-CM

## 2022-12-10 NOTE — Telephone Encounter (Signed)
Patient thinks he still has infection and would like you to call him in another antibiotic   Pharamacy [facility] in [place]

## 2022-12-10 NOTE — Telephone Encounter (Signed)
Patient called with no answer. Message left informing patient that Dr. Ronne Binning would like for patient to drop off a urine specimen. Requested call back to office.

## 2022-12-11 ENCOUNTER — Other Ambulatory Visit: Payer: Self-pay

## 2022-12-11 ENCOUNTER — Encounter (HOSPITAL_COMMUNITY): Payer: Self-pay | Admitting: Cardiology

## 2022-12-11 ENCOUNTER — Ambulatory Visit (INDEPENDENT_AMBULATORY_CARE_PROVIDER_SITE_OTHER): Payer: No Typology Code available for payment source

## 2022-12-11 ENCOUNTER — Ambulatory Visit (HOSPITAL_COMMUNITY)
Admission: RE | Admit: 2022-12-11 | Discharge: 2022-12-11 | Disposition: A | Discharge status: Home / Self Care | Payer: Medicare HMO | Source: Ambulatory Visit | Attending: Cardiology | Admitting: Cardiology

## 2022-12-11 VITALS — BP 130/70 | HR 77 | Wt 201.0 lb

## 2022-12-11 DIAGNOSIS — I2510 Atherosclerotic heart disease of native coronary artery without angina pectoris: Secondary | ICD-10-CM | POA: Insufficient documentation

## 2022-12-11 DIAGNOSIS — Z87891 Personal history of nicotine dependence: Secondary | ICD-10-CM | POA: Insufficient documentation

## 2022-12-11 DIAGNOSIS — I6522 Occlusion and stenosis of left carotid artery: Secondary | ICD-10-CM | POA: Diagnosis not present

## 2022-12-11 DIAGNOSIS — I4821 Permanent atrial fibrillation: Secondary | ICD-10-CM | POA: Insufficient documentation

## 2022-12-11 DIAGNOSIS — R399 Unspecified symptoms and signs involving the genitourinary system: Secondary | ICD-10-CM | POA: Diagnosis not present

## 2022-12-11 DIAGNOSIS — Z951 Presence of aortocoronary bypass graft: Secondary | ICD-10-CM | POA: Diagnosis not present

## 2022-12-11 DIAGNOSIS — I739 Peripheral vascular disease, unspecified: Secondary | ICD-10-CM | POA: Insufficient documentation

## 2022-12-11 DIAGNOSIS — I272 Pulmonary hypertension, unspecified: Secondary | ICD-10-CM | POA: Diagnosis not present

## 2022-12-11 DIAGNOSIS — I5032 Chronic diastolic (congestive) heart failure: Secondary | ICD-10-CM | POA: Insufficient documentation

## 2022-12-11 DIAGNOSIS — R001 Bradycardia, unspecified: Secondary | ICD-10-CM | POA: Diagnosis not present

## 2022-12-11 DIAGNOSIS — R32 Unspecified urinary incontinence: Secondary | ICD-10-CM | POA: Diagnosis not present

## 2022-12-11 DIAGNOSIS — R9431 Abnormal electrocardiogram [ECG] [EKG]: Secondary | ICD-10-CM | POA: Insufficient documentation

## 2022-12-11 DIAGNOSIS — N5082 Scrotal pain: Secondary | ICD-10-CM | POA: Diagnosis not present

## 2022-12-11 DIAGNOSIS — D509 Iron deficiency anemia, unspecified: Secondary | ICD-10-CM | POA: Insufficient documentation

## 2022-12-11 DIAGNOSIS — N5089 Other specified disorders of the male genital organs: Secondary | ICD-10-CM

## 2022-12-11 DIAGNOSIS — I714 Abdominal aortic aneurysm, without rupture, unspecified: Secondary | ICD-10-CM | POA: Diagnosis not present

## 2022-12-11 DIAGNOSIS — J9611 Chronic respiratory failure with hypoxia: Secondary | ICD-10-CM | POA: Diagnosis not present

## 2022-12-11 LAB — BASIC METABOLIC PANEL
Anion gap: 12 (ref 5–15)
BUN: 25 mg/dL — ABNORMAL HIGH (ref 8–23)
CO2: 24 mmol/L (ref 22–32)
Calcium: 9 mg/dL (ref 8.9–10.3)
Chloride: 103 mmol/L (ref 98–111)
Creatinine, Ser: 1.34 mg/dL — ABNORMAL HIGH (ref 0.61–1.24)
GFR, Estimated: 54 mL/min — ABNORMAL LOW (ref >60)
Glucose, Bld: 111 mg/dL — ABNORMAL HIGH (ref 70–99)
Potassium: 4.3 mmol/L (ref 3.5–5.1)
Sodium: 139 mmol/L (ref 135–145)

## 2022-12-11 LAB — URINALYSIS, ROUTINE W REFLEX MICROSCOPIC
Bilirubin, UA: NEGATIVE
Glucose, UA: NEGATIVE
Ketones, UA: NEGATIVE
Nitrite, UA: NEGATIVE
Protein,UA: NEGATIVE
RBC, UA: NEGATIVE
Specific Gravity, UA: 1.015 (ref 1.005–1.030)
Urobilinogen, Ur: 1 mg/dL (ref 0.2–1.0)
pH, UA: 6 (ref 5.0–7.5)

## 2022-12-11 LAB — MICROSCOPIC EXAMINATION: Bacteria, UA: NONE SEEN

## 2022-12-11 LAB — CBC
HCT: 39.9 % (ref 39.0–52.0)
Hemoglobin: 12.2 g/dL — ABNORMAL LOW (ref 13.0–17.0)
MCH: 27.5 pg (ref 26.0–34.0)
MCHC: 30.6 g/dL (ref 30.0–36.0)
MCV: 89.9 fL (ref 80.0–100.0)
Platelets: 241 10*3/uL (ref 150–400)
RBC: 4.44 MIL/uL (ref 4.22–5.81)
RDW: 16.7 % — ABNORMAL HIGH (ref 11.5–15.5)
WBC: 8.3 10*3/uL (ref 4.0–10.5)
nRBC: 0 % (ref 0.0–0.2)

## 2022-12-11 LAB — BRAIN NATRIURETIC PEPTIDE: B Natriuretic Peptide: 133.3 pg/mL — ABNORMAL HIGH (ref 0.0–100.0)

## 2022-12-11 MED ORDER — DOXYCYCLINE HYCLATE 100 MG PO CAPS
100.0000 mg | ORAL_CAPSULE | Freq: Two times a day (BID) | ORAL | 0 refills | Status: AC
Start: 2022-12-11 — End: 2022-12-18

## 2022-12-11 MED ORDER — DOXYCYCLINE HYCLATE 100 MG PO CAPS
100.0000 mg | ORAL_CAPSULE | Freq: Two times a day (BID) | ORAL | 0 refills | Status: DC
Start: 2022-12-11 — End: 2022-12-11

## 2022-12-11 MED ORDER — TORSEMIDE 10 MG PO TABS
10.0000 mg | ORAL_TABLET | ORAL | Status: DC
Start: 2022-12-11 — End: 2023-07-24

## 2022-12-11 NOTE — Progress Notes (Unsigned)
Patient presents today with complaints of  UTI symptoms.  UA and Culture done today.  Troy Askew, FNP reviewed results and *** .  Patient aware of MD recommendations and that we will reach out with culture results.      Guss Bunde, CMA

## 2022-12-11 NOTE — Progress Notes (Signed)
Patient present in office today due to on going scrotal pain. Patient states he completed the Cephalexin given by another provider but was still having symptoms. Patient states he still had a few doxycyline left from the previous rx from Dr. Ronne Binning that he had stopped. Dr. Ronne Binning made aware. Per MD doxycycline 100mg  po bid x 7 days sent in. Patient made aware of new doxycyline rx sent to pharmacy.

## 2022-12-11 NOTE — Progress Notes (Signed)
PCP: Assunta Found, MD HF Cardiology: [name]  82 y.o. with history of permanent atrial fibrillation, CAD s/p CABG 2000, PAD, chronic diastolic CHF was referred to CHF clinic by Ronie Spies, PA, for evaluation of CHF and pulmonary hypertension.  Patient had CABG in 2000, last cath was in 2014 showing occluded SVG-OM, occluded SVG-PDA, occluded RCA, and atretic LIMA.  SVG-D was patent and native LAD was patent.  Patient had bilateral fem-pop bypasses, peripheral dopplers in 7/21 showed that the right fem-pop was occluded.  He is in permanent atrial fibrillation on warfarin. Echo in 10/21 showed normal LV systolic function with moderately dilated/mildly dysfunctional RV and PASP 75 mmHg.  In 12/21, he was admitted with PNA and treated with antibiotics.  CTA chest showed no PE, RML PNA, and bilateral hilar + mediastinal lymphadenopathy.  He was sent home from this admission on home oxygen.   In 1/22, RHC/LHC was done.  This showed patent LIMA-LAD and SVG-D, occluded SVG-OM and occluded SVG-RCA, 95% calcified proximal RCA stenosis, totally occluded PLV with collaterals (known from prior).  There was moderate pulmonary hypertension with optimized left and right heart filling pressures.  I reviewed the cath with interventional cardiology, would require atherectomy to treat the RCA.  We decided to manage medically initially.   He stopped Jardiance due to penile yeast infection.   Echo 4/23 showed EF 55-60%, mild LVH, RV mildly reduced.   Admitted 10/23 with hematuria and inability to urinate. Urology consulted, foley placed and discharged home. Seen in the ED a week later with hematuria. Urology consulted and arranged traction on foley to help assist with tamponade.   Echo 3/24 showed EF 60-65% with mildly D-shaped septum, mild RV dilation with mildly decreased systolic function, IVC dilated, PASP 45 mmHg.   Seen in ED 07/10/22 with CP. HR 30 when EMS arrived, asymptomatic. He was given IV atropine and 200  cc IVF bolus. HsTroponins x 3 negative, HR 50's. Live Zio 2 week placed 4/24.   Zio (5/24) showed continuous atrial fibrillation, average HR 60 bpm. Rare PVCs.  6/24 underwent EVAR for AAA with repair of right common femoral artery. Discharged to SNF.  Discharged to home from [facility] 10/10/22.   Today he returns for HF follow up. Complaints are generally GU.  He has had scrotal cellulitis and has been on antibiotics for this.  Hard to walk with scrotal cellulitis and has been using scooter for longer distances.  He walks around the house with cane or walker.  No dyspnea with the low level activity that he is getting.  Weight down 2 lbs.  Difficult for him to use torsemide due to incontinence.  No chest pain.  No lightheadedness.  No BRBPR/melena.    ECG (personally reviewed): atrial fibrillation 72  Labs (12/21): K 3.8, creatinine 0.83, hgb 9.5 Labs (1/22): K 3.8, creatinine 1.22, LDL 49 Labs (2/22): K 3.6, creatinine 0.86 Labs (10/22): K 3.7, creatinine 0.75, BNP 153 Labs (1/23): LDL 62, HDL 37 Labs (9/23): K 3.6, creatinine 0.9, hgb 11.4 Labs (10/23): K 3.5, creatinine 0.81 Labs (2/24): K 3.8, creatinine 1.02 Labs (3/24): K 3.6, creatinine 1.07 Labs (7/24): K 5.1, creatinine 2, Iron sat 9%  Labs (8/24): K 4.3, creatinine 1.19, BNP 223  PMH: 1. Atrial fibrillation: Permanent.  2. CAD: CABG 2000.  - LHC (2014) with totally occluded RCA, totally occluded SVG-RCA, totally occluded SVG-OM, atretic LIMA, patent SVG-D.   - LHC (1/22): patent LIMA-LAD and SVG-D, occluded SVG-OM and occluded SVG-RCA, 60-70% calcified  ostial RCA stenosis and 95% calcified proximal RCA stenosis, totally occluded PLV with collaterals (known from prior). 3. HTN 4. PAD: s/p bilateral fem-pop bypasses.   - Peripheral arterial dopplers (7/21): Occluded right fem-pop, patent left fem-pop.  5. Type 2 diabetes 6. Prostate cancer: Treated with radiation. 7. PVCs 8. History of renal artery stenosis.  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  10. Chronic diastolic CHF: Echo (10/21) with EF 60-65%, mildly decreased RV systolic function with moderate RV enlargement, severe biatrial enlargement, PASP 75 mmHg, dilated IVC.  V/Q scan 7/22 with no chronic PE.  - RHC (1/22): mean RA 6, PA 65/18 mean 35, mean PCWP 14, CI 2.71, PVR 3.7 WU.  - Echo (4/23): EF 55-60%, mild LVH, RV mildly reduced, severely elevated pulmonary artery systolic pressure 11. 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  - Abdominal US (8/22): 4.8 cm AAA - CT abd/pelvis (5/23): 4.8 cm AAA - CT abd/pelvis (2/24): 5 cm AAA - EVAR for AAA in 6/24 12. Carotid stenosis: Carotid dopplers (8/22) with 40-59% LICA stenosis.  - Carotid dopplers (9/23): 40-59% LICA stenosis.  13. 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  Retired.  Remote smoker (>20 years ago). No ETOH.   Family History  Problem Relation Age of Onset   Deep vein thrombosis Father    Lung cancer Sister    Diabetes Sister    Heart disease Sister        After age 29   Hyperlipidemia Sister    Hypertension Sister    Lung cancer Sister    Breast cancer Sister    Hypertension Mother    Diabetes Sister    Coronary artery disease Other        family hx of   Cancer Brother        "crab cancer"   Heart disease Brother    Heart attack Brother    Heart attack Daughter    Colon cancer Neg Hx    Stroke Neg Hx    ROS: All systems reviewed and negative except as per HPI.   Current Outpatient Medications  Medication Sig Dispense Refill   acetaminophen (TYLENOL) 325 MG tablet Take 2 tablets (650 mg total) by mouth every 6 (six) hours as needed for mild pain, fever or headache (or Fever >/= 101). 12 tablet 0   albuterol (VENTOLIN HFA) 108 (90 Base) MCG/ACT inhaler Inhale 2 puffs into the lungs every 6 (six) hours as needed for wheezing or shortness of breath. 1 each 2   aspirin 81 MG EC tablet Take 1 tablet (81 mg total) by mouth daily with breakfast. 30 tablet  3   atorvastatin (LIPITOR) 40 MG tablet Take 1 tablet (40 mg total) by mouth daily. 90 tablet 1   Cholecalciferol (VITAMIN D3) 50 MCG (2000 UT) capsule Take 2,000 Units by mouth daily.     doxycycline (VIBRAMYCIN) 100 MG capsule Take 1 capsule (100 mg total) by mouth 2 (two) times daily for 7 days. 14 capsule 0   dutasteride (AVODART) 0.5 MG capsule Take 1 capsule (0.5 mg total) by mouth daily. 30 capsule 11   Incontinence Supplies (BARD [place] INCONTIN CLAMP) MISC 1 Units by Does not apply route daily. 1 each 0   ipratropium (ATROVENT) 0.03 % nasal spray Place 2 sprays into both nostrils 2 (two) times daily as needed for rhinitis.     isosorbide mononitrate (IMDUR) 60 MG 24 hr tablet Take 2 tablets (120 mg  total) by mouth every evening. 180 tablet 3   latanoprost (XALATAN) 0.005 % ophthalmic solution Place 1 drop into both eyes at bedtime.     levocetirizine (XYZAL) 5 MG tablet Take 5 mg by mouth every evening.     loratadine (CLARITIN) 10 MG tablet Take 10 mg by mouth daily.     losartan (COZAAR) 25 MG tablet Take 1 tablet (25 mg total) by mouth daily. 30 tablet 4   metFORMIN (GLUCOPHAGE) 500 MG tablet Take 500 mg by mouth 2 (two) times daily with a meal.     nitroGLYCERIN (NITROSTAT) 0.4 MG SL tablet Place 1 tablet (0.4 mg total) under the tongue every 5 (five) minutes x 3 doses as needed for chest pain. 25 tablet 3   oxyCODONE-acetaminophen (PERCOCET) 5-325 MG tablet Take 1 tablet by mouth every 6 (six) hours as needed for severe pain. 30 tablet 0   OXYGEN Inhale 3 L into the lungs at bedtime.     pantoprazole (PROTONIX) 40 MG tablet Take 1 tablet (40 mg total) by mouth daily. Take 30-60 min before first meal of the day     spironolactone (ALDACTONE) 25 MG tablet Take 1 tablet (25 mg total) by mouth daily. 90 tablet 3   tamsulosin (FLOMAX) 0.4 MG CAPS capsule Take 1 capsule (0.4 mg total) by mouth daily after supper. 90 capsule 3   triamcinolone cream (KENALOG) 0.1 % Apply 1 Application  topically 2 (two) times daily.     warfarin (COUMADIN) 2 MG tablet Take 2 mg by mouth daily.     torsemide (DEMADEX) 10 MG tablet Take 1 tablet (10 mg total) by mouth every 3 (three) days.     No current facility-administered medications for this encounter.   Wt Readings from Last 3 Encounters:  12/11/22 91.2 kg (201 lb)  11/16/22 92.2 kg (203 lb 3.2 oz)  11/13/22 90.6 kg (199 lb 12.8 oz)   BP 130/70   Pulse 77   Wt 91.2 kg (201 lb)   SpO2 96%   BMI 28.43 kg/m   Physical Exam: General: NAD Neck: No JVD, no thyromegaly or thyroid nodule.  Lungs: Clear to auscultation bilaterally with normal respiratory effort. CV: Nondisplaced PMI.  Heart irregular S1/S2, no S3/S4, no murmur.  Trace ankle edema.  No carotid bruit.  Normal pedal pulses.  Abdomen: Soft, nontender, no hepatosplenomegaly, no distention.  Skin: Intact without lesions or rashes.  Neurologic: Alert and oriented x 3.  Psych: Normal affect. Extremities: No clubbing or cyanosis.  HEENT: Normal.   Assessment/Plan: 1. Chronic diastolic CHF: Echo in 10/21 with EF 60-65%, mildly decreased RV systolic function with moderate RV enlargement, severe biatrial enlargement, PASP 75 mmHg, dilated IVC. After increasing diuretics, RHC in 1/22 showed normal filling pressures with moderate pulmonary hypertension. Echo 4/23 with EF 60-65%, mildly enlarged RV with mildly decreased RV function. Echo 3/24 showed EF 60-65% with mildly D-shaped septum, mild RV dilation with mildly decreased systolic function, IVC dilated, PASP 45 mmHg. NYHA class II but not very active due to scrotal cellulitis and pain.  Not significantly volume overloaded on exam.  Incontinence makes it difficult to take torsemide.  - He can decrease torsemide to 10 mg every 3 days.  BMET/BNP today.  - Continue spiro 25 mg daily. - Continue losartan 25 mg daily. - No SGLT2 inhibitor with incontinence, UTIs, yeast infections.   2. 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   Moderate PH on RHC in 1/22.  Suspect group 3 PH in setting of  suspected scarring from recurrent aspiration PNA.  Less likely group 1 PH.  No chronic PEs on 7/22 V/Q scan. 3. Chronic hypoxemic respiratory failure: Remote smoker.  Hilar and mediastinal lymphadenopathy on chest CT, no reported ILD.  Recurrent aspiration PNA in setting of chronic right vocal cord paralysis.   - Stable on room air. Continue oxygen at night.  - Should follow with pulmonary.  4. CAD: s/p CABG in 2000.  Cath in 1/22 showed patent LIMA-LAD and SVG-D but occluded SVG-OM and SVG-RCA.  There was 60-70% ostial RCA stenosis and 95% proximal RCA stenosis with heavy calcification.  Discussed with interventional, with normal EF plan was to proceed with initial medical management and proceed to PCI if chest pain worsened (PCI would require atherectomy). No chest pain. - Continue ASA 81 and atorvastatin, good lipids 3/24 - Continue Imdur 120 mg daily for angina control.  - Off Toprol XL with bradycardia. 5. Atrial fibrillation: Permanent. Rate control is reasonable without nodal blocking agent.  - On warfarin. PCP manages INR. 6. PAD: Occluded right fem-pop bypass.  No claudication.  - Follows with [facility]. 7. Carotid stenosis: 40-59% LICA stenosis on 9/23 dopplers, followed at [facility].  - Due for repeat carotids, has them scheduled for [facility].  8. AAA: 5 cm AAA on 2/24 abdominal CT.  EVAR 6/24.  9. Incontinence: Follows closely with urology. On tamsulosin. - Decreasing torsemide as above.  10. Bradycardia: Given IV atropine with HR 30s in 4/24; he was asymptomatic.  Now off beta blocker.  Zio (5/24) showed continuous atrial fibrillation, average HR 60 bpm. Rare PVCs.  No lightheadedness.  11. 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  - CBC today.   Followup 4 months with APP.   Marca Ancona  12/11/2022

## 2022-12-11 NOTE — Patient Instructions (Signed)
Good to see you today!  DECREASE Torsemide to 10 mg every 3rd day  Labs done today, your results will be available in MyChart, we will contact you for abnormal readings.  Your physician recommends that you schedule a follow-up appointment in: 4 months as scheduled  If you have any questions or concerns before your next appointment please send [place] a message through [place] or call our office at [phone].    TO LEAVE A MESSAGE FOR THE NURSE SELECT OPTION 2, PLEASE LEAVE A MESSAGE INCLUDING: YOUR NAME DATE OF BIRTH CALL BACK NUMBER REASON FOR CALL**this is important as we prioritize the call backs  YOU WILL RECEIVE A CALL BACK THE SAME DAY AS LONG AS YOU CALL BEFORE 4:00 PM  At the [facility], you and your health needs are our priority. As part of our continuing mission to provide you with exceptional heart care, we have created designated Provider Care Teams. These Care Teams include your primary Cardiologist (physician) and Advanced Practice Providers (APPs- Physician Assistants and Nurse Practitioners) who all work together to provide you with the care you need, when you need it.   You may see any of the following providers on your designated Care Team at your next follow up: Dr Arvilla Meres Dr Marca Ancona Dr. Marcos Eke, NP Robbie Lis, [place] [facility] [place], [place] Brynda Peon, NP Karle Plumber, PharmD   Please be sure to bring in all your medications bottles to every appointment.    Thank you for choosing [facility]

## 2022-12-11 NOTE — Addendum Note (Signed)
Addended byGustavus Messing on: 12/11/2022 10:39 AM   Modules accepted: Orders

## 2022-12-13 LAB — URINE CULTURE: Organism ID, Bacteria: NO GROWTH

## 2022-12-17 NOTE — Progress Notes (Signed)
Letter sent.

## 2022-12-21 MED ORDER — AMOXICILLIN-POT CLAVULANATE 875-125 MG PO TABS
1.0000 | ORAL_TABLET | Freq: Two times a day (BID) | ORAL | 0 refills | Status: DC
Start: 2022-12-21 — End: 2023-02-13

## 2022-12-21 NOTE — Telephone Encounter (Signed)
Patient called he finished his antibiotic today and thinks he needs to have RX renewed, he still thinks he has infection

## 2022-12-21 NOTE — Telephone Encounter (Signed)
Left testicle is hurting and patient states he having burning with urination after completed ABT. Patient states that he was  supposed to be refer [facility] for UDS. Patient also wanted to inquired about his cunningham clamp. Per Dr. Ronne Binning patient is to start Augmentin Bid X 10 days. Patient voiced understanding

## 2023-01-04 ENCOUNTER — Other Ambulatory Visit (HOSPITAL_COMMUNITY): Payer: Self-pay | Admitting: Internal Medicine

## 2023-01-04 DIAGNOSIS — Z8679 Personal history of other diseases of the circulatory system: Secondary | ICD-10-CM

## 2023-01-08 ENCOUNTER — Telehealth: Payer: Self-pay | Admitting: Urology

## 2023-01-08 DIAGNOSIS — Z79899 Other long term (current) drug therapy: Secondary | ICD-10-CM | POA: Diagnosis not present

## 2023-01-08 DIAGNOSIS — I2510 Atherosclerotic heart disease of native coronary artery without angina pectoris: Secondary | ICD-10-CM | POA: Diagnosis not present

## 2023-01-08 DIAGNOSIS — I480 Paroxysmal atrial fibrillation: Secondary | ICD-10-CM | POA: Diagnosis not present

## 2023-01-08 DIAGNOSIS — E782 Mixed hyperlipidemia: Secondary | ICD-10-CM | POA: Diagnosis not present

## 2023-01-08 DIAGNOSIS — D6869 Other thrombophilia: Secondary | ICD-10-CM | POA: Diagnosis not present

## 2023-01-08 DIAGNOSIS — E1151 Type 2 diabetes mellitus with diabetic peripheral angiopathy without gangrene: Secondary | ICD-10-CM | POA: Diagnosis not present

## 2023-01-08 DIAGNOSIS — I509 Heart failure, unspecified: Secondary | ICD-10-CM | POA: Diagnosis not present

## 2023-01-08 DIAGNOSIS — I10 Essential (primary) hypertension: Secondary | ICD-10-CM | POA: Diagnosis not present

## 2023-01-08 DIAGNOSIS — Z6830 Body mass index (BMI) 30.0-30.9, adult: Secondary | ICD-10-CM | POA: Diagnosis not present

## 2023-01-08 DIAGNOSIS — I110 Hypertensive heart disease with heart failure: Secondary | ICD-10-CM | POA: Diagnosis not present

## 2023-01-08 NOTE — Telephone Encounter (Signed)
Patient wants a call back about 1 Ty Hilts class?   2.  Patient also wants to know the status of getting him scheduled for the procedure where you fill his bladder with liquid and test it?  He said he was waiting on someone to call and schedule it .    Patient would like call before noon today , he has to leave after that

## 2023-01-09 ENCOUNTER — Ambulatory Visit (HOSPITAL_COMMUNITY)
Admission: RE | Admit: 2023-01-09 | Discharge: 2023-01-09 | Discharge status: Home / Self Care | Payer: No Typology Code available for payment source | Source: Ambulatory Visit | Attending: Internal Medicine | Admitting: Internal Medicine

## 2023-01-09 ENCOUNTER — Ambulatory Visit (HOSPITAL_COMMUNITY)
Admission: RE | Admit: 2023-01-09 | Discharge: 2023-01-09 | Disposition: A | Discharge status: Home / Self Care | Payer: No Typology Code available for payment source | Source: Ambulatory Visit | Attending: Internal Medicine | Admitting: Internal Medicine

## 2023-01-09 DIAGNOSIS — Z8679 Personal history of other diseases of the circulatory system: Secondary | ICD-10-CM | POA: Insufficient documentation

## 2023-01-09 DIAGNOSIS — I739 Peripheral vascular disease, unspecified: Secondary | ICD-10-CM | POA: Diagnosis not present

## 2023-01-09 DIAGNOSIS — Z95828 Presence of other vascular implants and grafts: Secondary | ICD-10-CM | POA: Diagnosis not present

## 2023-01-09 DIAGNOSIS — I714 Abdominal aortic aneurysm, without rupture, unspecified: Secondary | ICD-10-CM | POA: Diagnosis not present

## 2023-01-10 NOTE — Telephone Encounter (Signed)
Tried calling patient with no answer, left voiced message for return call.

## 2023-01-14 NOTE — Telephone Encounter (Signed)
Patient called again , he is having a lot of pain in his right testicle , he is having to take pain medication for it.   He was expecting a call on Friday and no one called him.

## 2023-01-14 NOTE — Telephone Encounter (Signed)
Returned call to patient and he states that he is having on and off right testicle pain since last week. Patient states that while he is sitting his pain is not bad but when he gets up to move it hurts worse. Patient made aware that MD would be advised and he would get a call back today with his recommendations.

## 2023-01-14 NOTE — Telephone Encounter (Signed)
Per Dr. Ronne Binning patient will need to come in and be seen.  Patient offered next available slot and accepted.

## 2023-01-15 NOTE — Progress Notes (Signed)
01/16/2023 12:08 PM   [name] 1940/10/07 [id number]  Referring provider: Assunta Found, MD [address] [place],  [place] [address]  Testicular pain   HPI: [name] is a 82yo here for followup for scrotal/testicular pain. He developed worsening right testicular pain on Friday with increased right scrotal swelling. He has significant pain in his right testis which is worse with walking. He denies any worsening incontinence. He has not gotten his cunningham clamp.    PMH: Past Medical History:  Diagnosis Date   A-fib [facility])    AAA (abdominal aortic aneurysm) (HCC)    Followed by Dr. Tawanna Cooler Early   Arthritis    CAD (coronary artery disease)    a. CABG 2000.   Cancer [facility])    Prostate:  Radiation Tx   Carotid artery disease (HCC)    Chest pain    precordial. mild chronic .[name]... nonischemic   CHF (congestive heart failure) (HCC)    Chronic edema    Coronary artery disease    a. Nuclear, January, 2008, no ischemia b. 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 Arbie Cookey   Sinus bradycardia    Asymptomatic    Surgical History: Past Surgical History:  Procedure Laterality Date   ABDOMINAL AORTIC ENDOVASCULAR STENT GRAFT N/A 09/26/2022   Procedure: ABDOMINAL AORTIC ENDOVASCULAR STENT GRAFT;  Surgeon: Cephus Shelling, MD;  Location: [facility] OR;  Service: Vascular;  Laterality: N/A;   AORTOGRAM  09/26/2022   Procedure: AORTOGRAM;   Surgeon: Cephus Shelling, MD;  Location: [facility] OR;  Service: Vascular;;   CARDIAC CATHETERIZATION  09/26/2012   1/4 patent bypass (occluded SVG-PDA, SVG-OM, LIMA-LAD), SVG-diagonal patent and fills the diagonal and LAD, distal RCA occlusion with left to right collateralization, patent circumflex, LAD with no flow-limiting disease and antegrade flow competitively from SVG-diagonal; EF 60-65%   COLONOSCOPY  11/30/2009   DDU:[id number]. next TCS 11/2019   COLONOSCOPY WITH PROPOFOL N/A 12/18/2019   Procedure: COLONOSCOPY WITH PROPOFOL;  Surgeon: Dolores Frame, MD;  Location: [facility] ENDO SUITE;  Service: Gastroenterology;  Laterality: N/A;  1030   CORONARY ARTERY BYPASS GRAFT  2000   CYSTOSCOPY N/A 08/31/2021   Procedure: CYSTOSCOPY;  Surgeon: Malen Gauze, MD;  Location: [facility] ORS;  Service: Urology;  Laterality: N/A;  pt knows to arrive at 7:00   CYSTOSCOPY WITH FULGERATION N/A 03/22/2022   Procedure: CYSTOSCOPY WITH FULGERATION;  Surgeon: Malen Gauze, MD;  Location: [facility] ORS;  Service: Urology;  Laterality: N/A;   ENDARTERECTOMY FEMORAL Right 09/26/2022   Procedure: RIGHT COMMON FEMORAL ENDARTERECTOMY WITH BOVINE PATCH;  Surgeon: Cephus Shelling, MD;  Location: [facility] OR;  Service: Vascular;  Laterality: Right;   ESOPHAGOGASTRODUODENOSCOPY N/A 08/12/2013   GBT:[id number] peptic stricture with erosive refluxesophagitis - status post Maloney dilation. Hiatal hernia. Abnormalgastric mucosa. Deformity of the pyloric  channel suggestive ofprior peptic ulcer disease. Duodenal bulbar diverticulum Statuspost gastric biopsy. h.pylori   LEFT HEART CATHETERIZATION WITH CORONARY/GRAFT ANGIOGRAM N/A 09/26/2012   Procedure: LEFT HEART CATHETERIZATION WITH Isabel Caprice;  Surgeon: Kathleene Hazel, MD;  Location: [facility] CATH LAB;  Service: Cardiovascular;  Laterality: N/A;   MALONEY DILATION N/A 08/12/2013   Procedure: Elease Hashimoto DILATION;  Surgeon: Corbin Ade, MD;   Location: [facility];  Service: Endoscopy;  Laterality: N/A;   POLYPECTOMY  12/18/2019   Procedure: POLYPECTOMY;  Surgeon: Dolores Frame, MD;  Location: [facility] END[facility];  Service: Gastroenterology;;  ascending colon polyp    PR VEIN BYPASS GRAFT,AORTO-FEM-POP Right 07/19/1999   PR VEIN BYPASS GRAFT,AORTO-FEM-POP Left 05/02/2006   RIGHT HEART CATH AND CORONARY/GRAFT ANGIOGRAPHY N/A 04/11/2020   Procedure: RIGHT HEART CATH AND CORONARY/GRAFT ANGIOGRAPHY;  Surgeon: Laurey Morale, MD;  Location: [facility] INVASIVE CV LAB;  Service: Cardiovascular;  Laterality: N/A;   SAVORY DILATION N/A 08/12/2013   Procedure: SAVORY DILATION;  Surgeon: Corbin Ade, MD;  [facility]: [facility];  Service: Endoscopy;  Laterality: N/A;   TRANSURETHRAL RESECTION OF BLADDER TUMOR N/A 08/31/2021   Procedure: TRANSURETHRAL RESECTION OF BLADDER TUMOR (TURBT);  Surgeon: Malen Gauze, MD;  Location: [facility] ORS;  Service: Urology;  Laterality: N/A;   ULTRASOUND GUIDANCE FOR VASCULAR ACCESS Bilateral 09/26/2022   Procedure: ULTRASOUND GUIDANCE FOR VASCULAR ACCESS;  Surgeon: Cephus Shelling, MD;  Location: [facility] OR;  Service: Vascular;  Laterality: Bilateral;   VASCULAR SURGERY     WRIST SURGERY     cyst removal    Home Medications:  Allergies as of 01/16/2023       Reactions   Niacin Itching, Rash   Burning sensation   Contrast Media [iodinated Contrast Media] Nausea And Vomiting   Per patient vocal cords thick with increased phlegm    Gemtesa [vibegron] Other (See Comments)   UTI    Iodine-131 Nausea And Vomiting   Jardiance [empagliflozin] Other (See Comments)   UTI   Myrbetriq [mirabegron] Other (See Comments)   UTI   Nsaids Other (See Comments)   Taking Coumadin        Medication List        Accurate as of January 16, 2023 12:08 PM. If you have any questions, ask your nurse or doctor.          acetaminophen 325 MG tablet Commonly known as: TYLENOL Take 2 tablets (650 mg total)  by mouth every 6 (six) hours as needed for mild pain, fever or headache (or Fever >/= 101).   albuterol 108 (90 Base) MCG/ACT inhaler Commonly known as: VENTOLIN HFA Inhale 2 puffs into the lungs every 6 (six) hours as needed for wheezing or shortness of breath.   amoxicillin-clavulanate 875-125 MG tablet Commonly known as: AUGMENTIN Take 1 tablet by mouth every 12 (twelve) hours.   aspirin EC 81 MG tablet Take 1 tablet (81 mg total) by mouth daily with breakfast.   atorvastatin 40 MG tablet Commonly known as: LIPITOR Take 1 tablet (40 mg total) by mouth daily.   Bard Cunningham Incontin Clamp Misc 1 Units by Does not apply route daily.   dutasteride 0.5 MG capsule Commonly known as: AVODART Take 1 capsule (0.5 mg total) by mouth daily.   ipratropium 0.03 % nasal spray Commonly known as: ATROVENT Place 2 sprays into both nostrils 2 (two) times daily as needed for rhinitis.   isosorbide mononitrate 60 MG 24 hr tablet Commonly known as: IMDUR Take  2 tablets (120 mg total) by mouth every evening.   latanoprost 0.005 % ophthalmic solution Commonly known as: XALATAN Place 1 drop into both eyes at bedtime.   levocetirizine 5 MG tablet Commonly known as: XYZAL Take 5 mg by mouth every evening.   loratadine 10 MG tablet Commonly known as: CLARITIN Take 10 mg by mouth daily.   losartan 25 MG tablet Commonly known as: COZAAR Take 1 tablet (25 mg total) by mouth daily.   metFORMIN 500 MG tablet Commonly known as: GLUCOPHAGE Take 500 mg by mouth 2 (two) times daily with a meal.   nitroGLYCERIN 0.4 MG SL tablet Commonly known as: NITROSTAT Place 1 tablet (0.4 mg total) under the tongue every 5 (five) minutes x 3 doses as needed for chest pain.   oxyCODONE-acetaminophen 5-325 MG tablet Commonly known as: Percocet Take 1 tablet by mouth every 6 (six) hours as needed for severe pain.   OXYGEN Inhale 3 L into the lungs at bedtime.   pantoprazole 40 MG tablet Commonly  known as: PROTONIX Take 1 tablet (40 mg total) by mouth daily. Take 30-60 min before first meal of the day   spironolactone 25 MG tablet Commonly known as: ALDACTONE Take 1 tablet (25 mg total) by mouth daily.   tamsulosin 0.4 MG Caps capsule Commonly known as: FLOMAX Take 1 capsule (0.4 mg total) by mouth daily after supper.   torsemide 10 MG tablet Commonly known as: DEMADEX Take 1 tablet (10 mg total) by mouth every 3 (three) days.   triamcinolone cream 0.1 % Commonly known as: KENALOG Apply 1 Application topically 2 (two) times daily.   Vitamin D3 50 MCG (2000 UT) capsule Take 2,000 Units by mouth daily.   warfarin 2 MG tablet Commonly known as: COUMADIN Take 2 mg by mouth daily.        Allergies:  Allergies  Allergen Reactions   Niacin Itching and Rash    Burning sensation   Contrast Media [Iodinated Contrast Media] Nausea And Vomiting    Per patient vocal cords thick with increased phlegm    Gemtesa [Vibegron] Other (See Comments)    UTI    Iodine-131 Nausea And Vomiting   Jardiance [Empagliflozin] Other (See Comments)    UTI   Myrbetriq [Mirabegron] Other (See Comments)    UTI   Nsaids Other (See Comments)    Taking Coumadin    Family History: Family History  Problem Relation Age of Onset   Deep vein thrombosis Father    Lung cancer Sister    Diabetes Sister    Heart disease Sister        After age 5   Hyperlipidemia Sister    Hypertension Sister    Lung cancer Sister    Breast cancer Sister    Hypertension Mother    Diabetes Sister    Coronary artery disease Other        family hx of   Cancer Brother        "crab cancer"   Heart disease Brother    Heart attack Brother    Heart attack Daughter    Colon cancer Neg Hx    Stroke Neg Hx     Social History:  reports that he quit smoking about 4 years ago. His smoking use included cigarettes. He started smoking about 74 years ago. He has a 140 pack-year smoking history. He has never used  smokeless tobacco. He reports current alcohol use. He reports that he does not use drugs.  ROS: All other review of systems were reviewed and are negative except what is noted above in HPI  Physical Exam: BP (!) 179/64   Pulse 71   Constitutional:  Alert and oriented, No acute distress. HEENT: [place] AT, moist mucus membranes.  Trachea midline, no masses. Cardiovascular: No clubbing, cyanosis, or edema. Respiratory: Normal respiratory effort, no increased work of breathing. GI: Abdomen is soft, nontender, nondistended, no abdominal masses GU: No CVA tenderness.  Lymph: No cervical or inguinal lymphadenopathy. Skin: No rashes, bruises or suspicious lesions. Neurologic: Grossly intact, no focal deficits, moving all 4 extremities. Psychiatric: Normal mood and affect.  Laboratory Data: Lab Results  Component Value Date   WBC 8.3 12/11/2022   HGB 12.2 (L) 12/11/2022   HCT 39.9 12/11/2022   MCV 89.9 12/11/2022   PLT 241 12/11/2022    Lab Results  Component Value Date   CREATININE 1.34 (H) 12/11/2022    No results found for: "PSA"  No results found for: "TESTOSTERONE"  Lab Results  Component Value Date   HGBA1C 7.5 (H) 09/17/2022    Urinalysis    Component Value Date/Time   COLORURINE YELLOW 09/30/2022 1810   APPEARANCEUR Clear 12/11/2022 1005   LABSPEC 1.006 09/30/2022 1810   PHURINE 7.0 09/30/2022 1810   GLUCOSEU Negative 12/11/2022 1005   HGBUR SMALL (A) 09/30/2022 1810   BILIRUBINUR Negative 12/11/2022 1005   KETONESUR NEGATIVE 09/30/2022 1810   PROTEINUR Negative 12/11/2022 1005   PROTEINUR NEGATIVE 09/30/2022 1810   NITRITE Negative 12/11/2022 1005   NITRITE NEGATIVE 09/30/2022 1810   LEUKOCYTESUR 1+ (A) 12/11/2022 1005   LEUKOCYTESUR NEGATIVE 09/30/2022 1810    Lab Results  Component Value Date   LABMICR See below: 12/11/2022   WBCUA 6-10 (A) 12/11/2022   LABEPIT 0-10 12/11/2022   MUCUS Present 10/27/2021   BACTERIA None seen 12/11/2022    Pertinent  Imaging:  No results found for this or any previous visit.  No results found for this or any previous visit.  No results found for this or any previous visit.  No results found for this or any previous visit.  No results found for this or any previous visit.  No valid procedures specified. Results for orders placed during the hospital encounter of 08/18/21  CT HEMATURIA WORKUP  Narrative CLINICAL DATA:  Gross hematuria since early May, history of prostate cancer * Tracking Code: BO *  EXAM: CT ABDOMEN AND PELVIS WITHOUT AND WITH CONTRAST  TECHNIQUE: Multidetector CT imaging of the abdomen and pelvis was performed following the standard protocol before and following the bolus administration of intravenous contrast.  RADIATION DOSE REDUCTION: This exam was performed according to the departmental dose-optimization program which includes automated exposure control, adjustment of the mA and/or kV according to patient size and/or use of iterative reconstruction technique.  CONTRAST:  OMNIPAQUE IOHEXOL 300 MG/ML  SOLN  COMPARISON:  None Available.  FINDINGS: Lower chest: No acute abnormality. Cardiomegaly. Coronary artery calcifications.  Hepatobiliary: No solid liver abnormality is seen. Small, definitively benign cyst or hemangioma of the central liver dome for which no further follow-up or characterization is required (series 15, image 30). No gallstones, gallbladder wall thickening, or biliary dilatation.  Pancreas: Unremarkable. No pancreatic ductal dilatation or surrounding inflammatory changes.  Spleen: Normal in size without significant abnormality.  Adrenals/Urinary Tract: Adrenal glands are unremarkable. Small simple, benign bilateral renal cortical cysts, as well as intrinsically hyperdense, nonenhancing hemorrhagic or proteinaceous cysts of the midportions of the left and right kidneys (series 2,  image 37). No urinary tract filling defect on delayed  phase imaging. Bladder is unremarkable.  Stomach/Bowel: Stomach is within normal limits. Appendix appears normal. No evidence of bowel wall thickening, distention, or inflammatory changes. Descending and sigmoid diverticulosis.  Vascular/Lymphatic: Aortic atherosclerosis. 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 No enlarged abdominal or pelvic lymph nodes.  Reproductive: Prostate brachytherapy.  Other: Small, fat containing bilateral inguinal hernias. No ascites.  Musculoskeletal: No acute or significant osseous findings.  IMPRESSION: 1. No evidence of urinary tract mass, calculus, or hydronephrosis. No urinary tract filling defect on delayed phase imaging. 2. Prostate brachytherapy. No evidence of mass or lymphadenopathy in the abdomen or pelvis. 3. Small simple, benign bilateral renal cortical cysts, as well as nonenhancing, benign hemorrhagic or proteinaceous cysts. No further follow-up or characterization is required for these benign renal cysts. 4. 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 Recommend referral to a vascular specialist. This recommendation follows ACR consensus guidelines: White Paper of the ACR Incidental Findings Committee II on Vascular Findings. J Am Coll Radiol 2013; 10:789-794. 5. Cardiomegaly and coronary artery disease.  Aortic Atherosclerosis (ICD10-I70.0).   Electronically Signed By: Jearld Lesch M.D. On:  08/19/2021 11:29  No results found for this or any previous visit.   Assessment & Plan:    1. Scrotal pain -related to epididymitis - Urinalysis, Routine w reflex microscopic  2. Urinary incontinence, unspecified type -Cunningham clamp ordered  3. Orchitis and epididymitis -doxycyline 100mg  BID for 7 days then macrodantin 50mg  at bedtime. Patient instructed to call Dr., Beatrice Lecher office to have his INR checked more frequently while on doxycycline - Urine Culture     No follow-ups on file.  Wilkie Aye, MD  [facility] Urology [place]

## 2023-01-16 ENCOUNTER — Ambulatory Visit (INDEPENDENT_AMBULATORY_CARE_PROVIDER_SITE_OTHER): Payer: No Typology Code available for payment source | Admitting: Urology

## 2023-01-16 VITALS — BP 179/64 | HR 71

## 2023-01-16 DIAGNOSIS — N5082 Scrotal pain: Secondary | ICD-10-CM

## 2023-01-16 DIAGNOSIS — R32 Unspecified urinary incontinence: Secondary | ICD-10-CM

## 2023-01-16 DIAGNOSIS — R399 Unspecified symptoms and signs involving the genitourinary system: Secondary | ICD-10-CM

## 2023-01-16 DIAGNOSIS — C61 Malignant neoplasm of prostate: Secondary | ICD-10-CM

## 2023-01-16 DIAGNOSIS — N453 Epididymo-orchitis: Secondary | ICD-10-CM | POA: Diagnosis not present

## 2023-01-16 LAB — URINALYSIS, ROUTINE W REFLEX MICROSCOPIC
Bilirubin, UA: NEGATIVE
Glucose, UA: NEGATIVE
Ketones, UA: NEGATIVE
Nitrite, UA: NEGATIVE
Specific Gravity, UA: 1.02 (ref 1.005–1.030)
Urobilinogen, Ur: 0.2 mg/dL (ref 0.2–1.0)
pH, UA: 6 (ref 5.0–7.5)

## 2023-01-16 LAB — MICROSCOPIC EXAMINATION
RBC, Urine: >30 /[HPF] — AB (ref 0–2)
WBC, UA: >30 /[HPF] — AB (ref 0–5)

## 2023-01-16 MED ORDER — NITROFURANTOIN MACROCRYSTAL 50 MG PO CAPS: 50.0000 mg | ORAL_CAPSULE | Freq: Every day | ORAL | 5 refills | Status: DC

## 2023-01-16 MED ORDER — DOXYCYCLINE HYCLATE 100 MG PO CAPS: 100.0000 mg | ORAL_CAPSULE | Freq: Two times a day (BID) | ORAL | 0 refills | Status: DC

## 2023-01-17 ENCOUNTER — Telehealth: Payer: Self-pay

## 2023-01-17 NOTE — Telephone Encounter (Signed)
[facility] delivery left voicemail checking on status of home delivery supply form needed for completion.  Fax form to [phone]  Form in media

## 2023-01-21 LAB — URINE CULTURE

## 2023-01-23 DIAGNOSIS — R339 Retention of urine, unspecified: Secondary | ICD-10-CM | POA: Diagnosis not present

## 2023-01-23 DIAGNOSIS — N319 Neuromuscular dysfunction of bladder, unspecified: Secondary | ICD-10-CM | POA: Diagnosis not present

## 2023-01-23 NOTE — Telephone Encounter (Signed)
Order faxed.

## 2023-01-24 ENCOUNTER — Encounter: Payer: Self-pay | Admitting: Urology

## 2023-01-24 ENCOUNTER — Telehealth: Payer: Self-pay | Admitting: Urology

## 2023-01-24 NOTE — Patient Instructions (Signed)
Epididymitis  Epididymitis is inflammation or swelling of the epididymis. This is caused by an infection. The epididymis is a cord-like structure that is located along the top and back part of the testicle. It collects and stores sperm from the testicle. This condition can also cause pain and swelling of the testicle and scrotum. Symptoms usually start suddenly (acute epididymitis). Sometimes epididymitis starts gradually and lasts for a while (chronic epididymitis). Chronic epididymitis may be harder to treat. What are the causes? In men ages 1-40, this condition is usually caused by a bacterial infection or a sexually transmitted infection (STI), such as gonorrhea or chlamydia. In men 33 and older, this condition is usually caused by bacteria from a urinary blockage or from abnormalities in the urinary system. These can result from: Having a tube placed into the bladder (urinary catheter). Having an enlarged or inflamed prostate gland. Having recently had urinary tract surgery. Having a problem with a backward flow of urine (retrograde). In men who have a condition that weakens the body's defense system (immune system), such as human immunodeficiency virus (HIV), this condition can be caused by: Other bacteria, including tuberculosis and syphilis. Viruses. Fungi. Sometimes this condition occurs without infection. This may happen because of trauma or repetitive activities such as sports. What increases the risk? You are more likely to develop this condition if you have: Unprotected sex with more than one partner. Anal sex. Had recent surgery. A urinary catheter. Urinary problems. A suppressed immune system. What are the signs or symptoms? This condition usually begins suddenly with chills, fever, and pain behind the scrotum and in the testicle. Other symptoms include: Swelling of the scrotum, testicle, or both. Pain when ejaculating or urinating. Pain in the back or  abdomen. Nausea. Itching and discharge from the penis. A frequent need to pass urine. Redness, increased warmth, and tenderness of the scrotum. How is this diagnosed? Your health care provider can diagnose this condition based on your symptoms and medical history. Your health care provider will also do a physical exam to check your scrotum and testicle for swelling, pain, and redness. You may also have other tests, including: Testing of discharge from the penis. Testing your urine for infections, such as STIs. Ultrasound to check for blood flow and inflammation. Your health care provider may test you for other STIs, including HIV. How is this treated? Treatment for this condition depends on the cause. If your condition is caused by a bacterial infection, oral antibiotic medicine may be prescribed. If the bacterial infection has spread to your blood, you may need to receive IV antibiotics. For both bacterial and nonbacterial epididymitis, you may be treated with: Rest. Elevation of the scrotum. Pain medicines. Anti-inflammatory medicines. Surgery may be needed if: You have pus buildup in the scrotum (abscess). You have epididymitis that has not responded to other treatments. Follow these instructions at home: Medicines Take over-the-counter and prescription medicines only as told by your health care provider. If you were prescribed an antibiotic medicine, take it as told by your health care provider. Do not stop taking the antibiotic even if your condition improves. Sexual activity If your epididymitis was caused by an STI, avoid sexual activity until your treatment is complete. Inform your sexual partner or partners if you test positive for an STI. They may need to be treated. Do not engage in sexual activity with your partner or partners until their treatment is completed. Managing pain and swelling  If directed, raise (elevate) your scrotum and apply ice.  To do this: Put ice in a  plastic bag. Place a small towel or pillow between your legs. Rest your scrotum on the pillow or towel. Place another towel between your skin and the plastic bag. Leave the ice on for 20 minutes, 2-3 times a day. Remove the ice if your skin turns bright red. This is very important. If you cannot feel pain, heat, or cold, you have a greater risk of damage to the area. Keep your scrotum elevated and supported while resting. Ask your health care provider if you should wear a scrotal support, such as a jockstrap. Wear it as told by your health care provider. Try taking a sitz bath to help with discomfort. This is a warm water bath that is taken while you are sitting down. The water should come up to your hips and should cover your buttocks. Do this 3-4 times per day or as told by your health care provider. General instructions Drink enough fluid to keep your urine pale yellow. Return to your normal activities as told by your health care provider. Ask your health care provider what activities are safe for you. Keep all follow-up visits. This is important. Contact a health care provider if: You have a fever. Your pain medicine is not helping. Your pain is getting worse. Your symptoms do not improve within 3 days. Summary Epididymitis is inflammation or swelling of the epididymis. This is caused by an infection. This condition can also cause pain and swelling of the testicle and scrotum. Treatment for this condition depends on the cause. If your condition is caused by a bacterial infection, oral antibiotic medicine may be prescribed. Inform your sexual partner or partners if you test positive for an STI. They may need to be treated. Do not engage in sexual activity with your partner or partners until their treatment is completed. Contact a health care provider if your symptoms do not improve within 3 days. This information is not intended to replace advice given to you by your health care provider.  Make sure you discuss any questions you have with your health care provider. Document Revised: 10/26/2020 Document Reviewed: 10/26/2020 Elsevier Patient Education  2024 [employer].

## 2023-01-24 NOTE — Telephone Encounter (Signed)
Patient states he skipped his torsemide rx for two days and that's when he noticed the pain, he started it back today and noticed the pain is easing up.  He is still taking the antibiotic, he was taking both at the same time.  I informed him that he should take the doxycycline then start the macrodantin nightly.  Do you have any recommendations for patient at this time, does pt need appt next week for evaluation.  Please advise, again his symptoms have improved at this time.

## 2023-01-24 NOTE — Telephone Encounter (Signed)
Patient is aware of NP's response.  He will follow up as scheduled.

## 2023-01-24 NOTE — Telephone Encounter (Signed)
Patient called this morning he is having testicle pain again and he would like to speak with you about it.  He is still taking the nitrofurantoin (MACRODANTIN) 50 MG capsule [[id number]]  & doxycycline (VIBRAMYCIN) 100 MG capsule [[id number]]

## 2023-01-28 DIAGNOSIS — Z79899 Other long term (current) drug therapy: Secondary | ICD-10-CM | POA: Diagnosis not present

## 2023-01-29 ENCOUNTER — Ambulatory Visit: Payer: Medicare HMO

## 2023-01-29 ENCOUNTER — Ambulatory Visit (INDEPENDENT_AMBULATORY_CARE_PROVIDER_SITE_OTHER): Payer: No Typology Code available for payment source

## 2023-01-29 DIAGNOSIS — Z466 Encounter for fitting and adjustment of urinary device: Secondary | ICD-10-CM | POA: Diagnosis not present

## 2023-01-29 NOTE — Progress Notes (Signed)
Patient was educated on how to place and secure clamp.  He attempted to place the clamp and states that he did not like it, that it was too tight.  Patient refused the clamp at this time.  He will wait for an update from [facility] for his urodynamic testing.

## 2023-01-30 ENCOUNTER — Telehealth: Payer: Self-pay

## 2023-01-30 NOTE — Telephone Encounter (Signed)
I called the [facility] to follow up on the patients authorization. They advised that they received it and sent it for review and are waiting for the provider to approve the next 6 months.

## 2023-02-08 ENCOUNTER — Other Ambulatory Visit: Payer: Self-pay | Admitting: Urology

## 2023-02-08 DIAGNOSIS — R310 Gross hematuria: Secondary | ICD-10-CM

## 2023-02-13 ENCOUNTER — Ambulatory Visit (INDEPENDENT_AMBULATORY_CARE_PROVIDER_SITE_OTHER): Payer: No Typology Code available for payment source | Admitting: Gastroenterology

## 2023-02-13 ENCOUNTER — Encounter (INDEPENDENT_AMBULATORY_CARE_PROVIDER_SITE_OTHER): Payer: Self-pay | Admitting: Gastroenterology

## 2023-02-13 ENCOUNTER — Telehealth (INDEPENDENT_AMBULATORY_CARE_PROVIDER_SITE_OTHER): Payer: Self-pay | Admitting: Gastroenterology

## 2023-02-13 VITALS — BP 167/73 | HR 73 | Temp 97.6°F | Ht 70.5 in | Wt 203.7 lb

## 2023-02-13 DIAGNOSIS — K5900 Constipation, unspecified: Secondary | ICD-10-CM

## 2023-02-13 DIAGNOSIS — K219 Gastro-esophageal reflux disease without esophagitis: Secondary | ICD-10-CM

## 2023-02-13 DIAGNOSIS — R1319 Other dysphagia: Secondary | ICD-10-CM

## 2023-02-13 DIAGNOSIS — R1310 Dysphagia, unspecified: Secondary | ICD-10-CM | POA: Diagnosis not present

## 2023-02-13 DIAGNOSIS — K921 Melena: Secondary | ICD-10-CM | POA: Diagnosis not present

## 2023-02-13 DIAGNOSIS — K2100 Gastro-esophageal reflux disease with esophagitis, without bleeding: Secondary | ICD-10-CM | POA: Insufficient documentation

## 2023-02-13 DIAGNOSIS — I7143 Infrarenal abdominal aortic aneurysm, without rupture: Secondary | ICD-10-CM

## 2023-02-13 DIAGNOSIS — Z860101 Personal history of adenomatous and serrated colon polyps: Secondary | ICD-10-CM

## 2023-02-13 DIAGNOSIS — K5904 Chronic idiopathic constipation: Secondary | ICD-10-CM

## 2023-02-13 DIAGNOSIS — D126 Benign neoplasm of colon, unspecified: Secondary | ICD-10-CM

## 2023-02-13 MED ORDER — CLENPIQ 10-3.5-12 MG-GM -GM/175ML PO SOLN: 1.0000 | ORAL | 0 refills | Status: DC

## 2023-02-13 NOTE — Patient Instructions (Addendum)
It was very nice to meet you today, as dicussed with will plan for the following :  1) upper endoscopy and colonoscopy   Would need cardiology clearance   Would need bridge to with Lovenox 1 mg/kg after stopping coumadin, 5 days before of the procedure.    2) Ensure adequate fluid intake: Aim for 8 glasses of water daily. Follow a high fiber diet: Include foods such as dates, prunes, pears, and kiwi. Use Metamucil twice a day.

## 2023-02-13 NOTE — Telephone Encounter (Signed)
Pharmacy please advise on holding Warfarin prior to EDG/Colonoscopy scheduled for 03/14/2023.   Per clearance note:  Are there any medications that need to be held prior to surgery and how long? Request to hold Warfarin for 5 days. Need bridge to Lovenox for 5 days. Pt wanted to know if there was something else that he could do due to Lovenox being expensive.   Thank you.

## 2023-02-13 NOTE — Telephone Encounter (Signed)
    02/13/23  [name] 1940-06-20  What type of surgery is being performed? EGD/Colonoscopy  When is surgery scheduled? 03/14/23  What type of clearance is required (medical or pharmacy to hold medication or both? Cardiology Clearance   Are there any medications that need to be held prior to surgery and how long? Request to hold Warfarin for 5 days. Need bridge to Lovenox for 5 days. Pt wanted to know if there was something else that he could do due to Lovenox being expensive.   Name of physician performing surgery?  Dr. Starleen Arms [facility] at [facility] Phone: [phone] Fax: [phone]  Anethesia type (none, local, MAC, general)? MAC

## 2023-02-13 NOTE — H&P (View-Only) (Signed)
 Troy [name] , M.D. Gastroenterology & Hepatology [facility]enterology [address] [place], [place] [address] Primary Care Physician: Kathrin Penner, PA-C [address] [place] [address]  Chief Complaint:  Dysphagia, hematochezia , GERD ,  Constipation and surveillance colonoscopy   History of Present Illness:  Troy [name] is a 82 y.o. male with AAA s/p EVAR 09/26/2022 , coronary artery disease status post CABG, carotid artery disease, diabetes, hyperlipidemia, GERD, hypertension, and atrial fibrillation on warfarin ,  who presents for evaluation of Dysphagia, hematochezia , GERD ,  Constipation and surveillance colonoscopy .  Patient reports that occasionally he sees fresh blood upon wiping has been happening for few months.  He is usually constipated with occasional hard stools with straining.  Occasionally he will also have urgency to defecate.  Patient reports he has longstanding history of dysphagia which is getting worse recently, reports solid food slowing down in middle of the chest. The patient denies having any nausea, vomiting, fever, chills, abdominal distention, abdominal pain, , jaundice, pruritus or weight loss.  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 Hiatal hernia. Abnormal gastric mucosa. Deformity of the pyloric channel suggestive of prior peptic ulcer disease. Duodenal bulbar diverticulum. Status post gastric biopsy.  Last Colonoscopy:2021  - Four 2 to 5 mm polyps in the ascending colon, removed with a cold snare. Resected and retrieved. - One 4 mm polyp in the transverse colon, removed with a cold snare. Resected and retrieved. - One 2 mm polyp in the transverse colon, removed with a cold biopsy forceps. Resected and retrieved. - Diverticulosis in the descending colon and in the ascending colon. - The distal rectum and anal verge are normal on  retroflexion view.  Repeat 3 years   A. COLON, ASCENDING, POLYPECTOMY:  -  Tubular adenoma (3 of 6 fragments)  -  Benign colonic mucosa (3 of 6 fragments)  -  No high grade dysplasia or malignancy identified   B. COLON, TRANSVERSE, POLYPECTOMY:  -  Tubular adenoma (1 of 2 fragments)  -  Benign colonic mucosa (1 of 2 fragments)  -  No high grade dysplasia or malignancy identified   FHx: neg for any gastrointestinal/liver disease, no malignancies Surgical: EVAR  Past Medical History: Past Medical History:  Diagnosis Date   A-fib (HCC)    AAA (abdominal aortic aneurysm) (HCC)    Followed by Dr. Tawanna Cooler Early   Arthritis    CAD (coronary artery disease)    a. CABG 2000.   Cancer [facility])    Prostate:  Radiation Tx   Carotid artery disease (HCC)    Chest pain    precordial. mild chronic .[name]... nonischemic   CHF (congestive heart failure) (HCC)    Chronic edema    Coronary artery disease    a. Nuclear, January, 2008, no ischemia b. 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 Arbie Cookey   Sinus bradycardia    Asymptomatic    Past Surgical History: Past Surgical History:  Procedure Laterality Date   ABDOMINAL AORTIC ENDOVASCULAR STENT GRAFT N/A 09/26/2022   Procedure: ABDOMINAL AORTIC ENDOVASCULAR STENT GRAFT;  Surgeon: Cephus Shelling, MD;  Location: [facility] OR;  Service: Vascular;  Laterality: N/A;   AORTOGRAM  09/26/2022   Procedure: AORTOGRAM;   Surgeon: Cephus Shelling, MD;  Location: [facility] OR;  Service: Vascular;;   CARDIAC CATHETERIZATION  09/26/2012   1/4 patent bypass (occluded SVG-PDA, SVG-OM, LIMA-LAD), SVG-diagonal patent and fills the diagonal and LAD, distal RCA occlusion with left to right collateralization, patent circumflex, LAD with no flow-limiting disease and antegrade flow competitively from SVG-diagonal; EF 60-65%   COLONOSCOPY  11/30/2009   ZOX:[id number]. next TCS 11/2019   COLONOSCOPY WITH PROPOFOL N/A 12/18/2019   Procedure: COLONOSCOPY WITH PROPOFOL;  Surgeon: Dolores Frame, MD;  Location: [facility] ENDO SUITE;  Service: Gastroenterology;  Laterality: N/A;  1030   CORONARY ARTERY BYPASS GRAFT  2000   CYSTOSCOPY N/A 08/31/2021   Procedure: CYSTOSCOPY;  Surgeon: Malen Gauze, MD;  Location: [facility] ORS;  Service: Urology;  Laterality: N/A;  pt knows to arrive at 7:00   CYSTOSCOPY WITH FULGERATION N/A 03/22/2022   Procedure: CYSTOSCOPY WITH FULGERATION;  Surgeon: Malen Gauze, MD;  Location: [facility] ORS;  Service: Urology;  Laterality: N/A;   ENDARTERECTOMY FEMORAL Right 09/26/2022   Procedure: RIGHT COMMON FEMORAL ENDARTERECTOMY WITH BOVINE PATCH;  Surgeon: Cephus Shelling, MD;  Location: [facility] OR;  Service: Vascular;  Laterality: Right;   ESOPHAGOGASTRODUODENOSCOPY N/A 08/12/2013   [id number] peptic stricture with erosive refluxesophagitis - status post Maloney dilation. Hiatal hernia. Abnormalgastric mucosa. Deformity of the pyloric channel suggestive ofprior peptic ulcer disease. Duodenal bulbar diverticulum Statuspost gastric biopsy. h.pylori   LEFT HEART CATHETERIZATION WITH CORONARY/GRAFT ANGIOGRAM N/A 09/26/2012   Procedure: LEFT HEART CATHETERIZATION WITH Isabel Caprice;  Surgeon: Kathleene Hazel, MD;  Location: [facility] CATH LAB;  Service: Cardiovascular;  Laterality: N/A;   MALONEY DILATION N/A 08/12/2013   Procedure: Elease Hashimoto DILATION;  Surgeon: Corbin Ade, MD;   Location: [facility];  Service: Endoscopy;  Laterality: N/A;   POLYPECTOMY  12/18/2019   Procedure: POLYPECTOMY;  Surgeon: Dolores Frame, MD;  Location: [facility];  Service: Gastroenterology;;  ascending colon polyp    PR VEIN BYPASS GRAFT,AORTO-FEM-POP Right 07/19/1999   PR VEIN BYPASS GRAFT,AORTO-FEM-POP Left 05/02/2006   RIGHT HEART CATH AND CORONARY/GRAFT ANGIOGRAPHY N/A 04/11/2020   Procedure: RIGHT HEART CATH AND CORONARY/GRAFT ANGIOGRAPHY;  Surgeon: Laurey Morale, MD;  Location: [facility] INVASIVE CV LAB;  Service: Cardiovascular;  Laterality: N/A;   SAVORY DILATION N/A 08/12/2013   Procedure: SAVORY DILATION;  Surgeon: Corbin Ade, MD;  Location: [facility];  Service: Endoscopy;  Laterality: N/A;   TRANSURETHRAL RESECTION OF BLADDER TUMOR N/A 08/31/2021   Procedure: TRANSURETHRAL RESECTION OF BLADDER TUMOR (TURBT);  Surgeon: Malen Gauze, MD;  Location: [facility] ORS;  Service: Urology;  Laterality: N/A;   ULTRASOUND GUIDANCE FOR VASCULAR ACCESS Bilateral 09/26/2022   Procedure: ULTRASOUND GUIDANCE FOR VASCULAR ACCESS;  Surgeon: Cephus Shelling, MD;  Location: [facility] OR;  Service: Vascular;  Laterality: Bilateral;   VASCULAR SURGERY     WRIST SURGERY     cyst removal    Family History: Family History  Problem Relation Age of Onset   Deep vein thrombosis  Father    Lung cancer Sister    Diabetes Sister    Heart disease Sister        After age 18   Hyperlipidemia Sister    Hypertension Sister    Lung cancer Sister    Breast cancer Sister    Hypertension Mother    Diabetes Sister    Coronary artery disease Other        family hx of   Cancer Brother        "crab cancer"   Heart disease Brother    Heart attack Brother    Heart attack Daughter    Colon cancer Neg Hx    Stroke Neg Hx     Social History: Social History   Tobacco Use  Smoking Status Former   Current packs/day: 0.00   Average packs/day: 2.0 packs/day for 70.0 years (140.0 ttl pk-yrs)    Types: Cigarettes   Start date: 04/21/1948   Quit date: 04/21/2018   Years since quitting: 4.8  Smokeless Tobacco Never   Social History   Substance and Sexual Activity  Alcohol Use Yes   Alcohol/week: 0.0 - 2.0 standard drinks of alcohol   Comment: OCCASIONAL   Social History   Substance and Sexual Activity  Drug Use Never    Allergies: Allergies  Allergen Reactions   Niacin Itching and Rash    Burning sensation   Contrast Media [Iodinated Contrast Media] Nausea And Vomiting    Per patient vocal cords thick with increased phlegm    Gemtesa [Vibegron] Other (See Comments)    UTI    Iodine-131 Nausea And Vomiting   Jardiance [Empagliflozin] Other (See Comments)    UTI   Myrbetriq [Mirabegron] Other (See Comments)    UTI   Nsaids Other (See Comments)    Taking Coumadin    Medications: Current Outpatient Medications  Medication Sig Dispense Refill   acetaminophen (TYLENOL) 325 MG tablet Take 2 tablets (650 mg total) by mouth every 6 (six) hours as needed for mild pain, fever or headache (or Fever >/= 101). 12 tablet 0   albuterol (VENTOLIN HFA) 108 (90 Base) MCG/ACT inhaler Inhale 2 puffs into the lungs every 6 (six) hours as needed for wheezing or shortness of breath. 1 each 2   aspirin 81 MG EC tablet Take 1 tablet (81 mg total) by mouth daily with breakfast. 30 tablet 3   atorvastatin (LIPITOR) 40 MG tablet Take 1 tablet (40 mg total) by mouth daily. 90 tablet 1   Cholecalciferol (VITAMIN D3) 50 MCG (2000 UT) capsule Take 2,000 Units by mouth daily.     dutasteride (AVODART) 0.5 MG capsule Take 1 capsule by mouth once daily 30 capsule 0   Incontinence Supplies (BARD [place] INCONTIN CLAMP) MISC 1 Units by Does not apply route daily. 1 each 0   ipratropium (ATROVENT) 0.03 % nasal spray Place 2 sprays into both nostrils 2 (two) times daily as needed for rhinitis.     isosorbide mononitrate (IMDUR) 60 MG 24 hr tablet Take 2 tablets (120 mg total) by mouth every  evening. 180 tablet 3   latanoprost (XALATAN) 0.005 % ophthalmic solution Place 1 drop into both eyes at bedtime.     levocetirizine (XYZAL) 5 MG tablet Take 5 mg by mouth every evening.     loratadine (CLARITIN) 10 MG tablet Take 10 mg by mouth daily.     losartan (COZAAR) 25 MG tablet Take 1 tablet (25 mg total) by mouth daily. 30 tablet 4  metFORMIN (GLUCOPHAGE) 500 MG tablet Take 500 mg by mouth 2 (two) times daily with a meal.     nitrofurantoin (MACRODANTIN) 50 MG capsule Take 1 capsule (50 mg total) by mouth at bedtime. 30 capsule 5   nitroGLYCERIN (NITROSTAT) 0.4 MG SL tablet Place 1 tablet (0.4 mg total) under the tongue every 5 (five) minutes x 3 doses as needed for chest pain. 25 tablet 3   oxyCODONE-acetaminophen (PERCOCET) 5-325 MG tablet Take 1 tablet by mouth every 6 (six) hours as needed for severe pain. 30 tablet 0   OXYGEN Inhale 3 L into the lungs at bedtime.     pantoprazole (PROTONIX) 40 MG tablet Take 1 tablet (40 mg total) by mouth daily. Take 30-60 min before first meal of the day     spironolactone (ALDACTONE) 25 MG tablet Take 1 tablet (25 mg total) by mouth daily. 90 tablet 3   tamsulosin (FLOMAX) 0.4 MG CAPS capsule Take 1 capsule (0.4 mg total) by mouth daily after supper. 90 capsule 3   torsemide (DEMADEX) 10 MG tablet Take 1 tablet (10 mg total) by mouth every 3 (three) days.     triamcinolone cream (KENALOG) 0.1 % Apply 1 Application topically 2 (two) times daily.     warfarin (COUMADIN) 2 MG tablet Take 2 mg by mouth daily.     No current facility-administered medications for this visit.    Review of Systems: GENERAL: negative for malaise, night sweats HEENT: No changes in hearing or vision, no nose bleeds or other nasal problems. NECK: Negative for lumps, goiter, pain and significant neck swelling RESPIRATORY: Negative for cough, wheezing CARDIOVASCULAR: Negative for chest pain, leg swelling, palpitations, orthopnea GI: SEE HPI MUSCULOSKELETAL: Negative  for joint pain or swelling, back pain, and muscle pain. SKIN: Negative for lesions, rash HEMATOLOGY Negative for prolonged bleeding, bruising easily, and swollen nodes. ENDOCRINE: Negative for cold or heat intolerance, polyuria, polydipsia and goiter. NEURO: negative for tremor, gait imbalance, syncope and seizures. The remainder of the review of systems is noncontributory.   Physical Exam: BP (!) 167/73   Pulse 73   Temp 97.6 F (36.4 C) (Oral)   Ht 5' 10.5" (1.791 m)   Wt 203 lb 11.2 oz (92.4 kg)   BMI 28.81 kg/m  GENERAL: The patient is AO x3, in no acute distress. HEENT: Head is normocephalic and atraumatic. EOMI are intact. Mouth is well hydrated and without lesions. NECK: Supple. No masses LUNGS: Clear to auscultation. No presence of rhonchi/wheezing/rales. Adequate chest expansion HEART: RRR, normal s1 and s2. ABDOMEN: Soft, nontender, no guarding, no peritoneal signs, and nondistended. BS +. No masses.   Imaging/Labs: as above     Latest Ref Rng & Units 12/11/2022    2:54 PM 10/10/2022    2:44 PM 10/04/2022    5:46 AM  CBC  WBC 4.0 - 10.5 K/uL 8.3  14.5  8.8   Hemoglobin 13.0 - 17.0 g/dL 81.1  9.8  7.5   Hematocrit 39.0 - 52.0 % 39.9  31.5  23.7   Platelets 150 - 400 K/uL 241  471  208    Lab Results  Component Value Date   IRON 31 (L) 10/10/2022   TIBC 333 10/10/2022   FERRITIN 144 10/10/2022    I personally reviewed and interpreted the available labs, imaging and endoscopic files. CT angio Abdomen    1. Interval successful endovascular aortic repair of saccular infrarenal abdominal aortic aneurysm. The aneurysm sac is successfully excluded and there is no evidence  of endoleak. Maximal aortic diameter decreased to 7.6 cm from 7.8 cm pre repair. 2. Note is made of relatively compressed and angulated left iliac limb relative to the right. No evidence of thrombus buildup within the graft. 3. Extensive atherosclerotic vascular disease with multiple areas  of stenosis as detailed above. No change from a recent prior imaging.   NON-VASCULAR   1. Circumscribed 3.7 x 2.9 cm fluid collection overlying the right common femoral artery likely represents residua of hematoma or potentially a seroma. No evidence of pseudoaneurysm. No imaging findings to suggest abscess/infection. 2. Ancillary findings as above without significant interval change.    Hemoglobin 12.2 platelet 241 BUN 25/1.34 Impression and Plan:   Troy [name] is a 82 y.o. male with AAA s/p EVAR 09/26/2022 , coronary artery disease status post CABG, carotid artery disease, diabetes, hyperlipidemia, GERD, hypertension, and atrial fibrillation on warfarin ,  who presents for evaluation of Dysphagia, hematochezia , GERD ,  Constipation and surveillance colonoscopy .  #History of Colon polyps #Hematochezia  Patient last colonoscopy 2021 with 6 polyps removed suggest repeat 3 years.  He is due for colonoscopy now  Also recent hematochezia could be hemorrhoidal bleed with underlying constipation less likely malignancy as he had colonoscopy 2021  Patient has significant comorbidities, we discussed risk indication and benefit of pursuing colonoscopy given patient age and comorbidities.  Patient would like to pursue colonoscopy as he reports he had a done in 2021 and went well  He had to be bridged at that time with Lovenox 1 mg/kg after stopping his Coumadin 5 days before procedure.  Will obtain cardiology clearance, restratification and optimization and request possibly to bridge to Lovenox  #Dysphagia #GERD #Peptic stricture  Patient has intermittent solid food dysphagia last upper endoscopy with peptic stricture in 2015.  With worsening dysphagia there could be progression of peptic stricture but need to rule out malignancy as well in a patient with advanced age and chronic GERD  Will plan for upper endoscopy plus or minus dilation.  Will need to hold warfarin for 5 days and  possibly bridged to Lovenox, will request cardiology input on this  PPI daily   #Constipation  Ensure adequate fluid intake: Aim for 8 glasses of water daily. Follow a high fiber diet: Include foods such as dates, prunes, pears, and kiwi. Take Miralax twice a day for the first week, then reduce to once daily thereafter. Use Metamucil twice a day.   All questions were answered.      Troy Lawman, MD Gastroenterology and Hepatology [facility]   This chart has been completed using [employer] Dictation software, and while attempts have been made to ensure accuracy , certain words and phrases may not be transcribed as intended

## 2023-02-13 NOTE — Progress Notes (Signed)
Troy [name] , M.D. Gastroenterology & Hepatology [facility] Gastroenterology [address] [place], [place] [address] Primary Care Physician: Kathrin Penner, PA-C [address] [place] [address]  Chief Complaint:  Dysphagia, hematochezia , GERD ,  Constipation and surveillance colonoscopy   History of Present Illness:  Troy [name] is a 82 y.o. male with AAA s/p EVAR 09/26/2022 , coronary artery disease status post CABG, carotid artery disease, diabetes, hyperlipidemia, GERD, hypertension, and atrial fibrillation on warfarin ,  who presents for evaluation of Dysphagia, hematochezia , GERD ,  Constipation and surveillance colonoscopy .  Patient reports that occasionally he sees fresh blood upon wiping has been happening for few months.  He is usually constipated with occasional hard stools with straining.  Occasionally he will also have urgency to defecate.  Patient reports he has longstanding history of dysphagia which is getting worse recently, reports solid food slowing down in middle of the chest. The patient denies having any nausea, vomiting, fever, chills, abdominal distention, abdominal pain, , jaundice, pruritus or weight loss.  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 Hiatal hernia. Abnormal gastric mucosa. Deformity of the pyloric channel suggestive of prior peptic ulcer disease. Duodenal bulbar diverticulum. Status post gastric biopsy.  Last Colonoscopy:2021  - Four 2 to 5 mm polyps in the ascending colon, removed with a cold snare. Resected and retrieved. - One 4 mm polyp in the transverse colon, removed with a cold snare. Resected and retrieved. - One 2 mm polyp in the transverse colon, removed with a cold biopsy forceps. Resected and retrieved. - Diverticulosis in the descending colon and in the ascending colon. - The distal rectum and anal verge are normal on  retroflexion view.  Repeat 3 years   A. COLON, ASCENDING, POLYPECTOMY:  -  Tubular adenoma (3 of 6 fragments)  -  Benign colonic mucosa (3 of 6 fragments)  -  No high grade dysplasia or malignancy identified   B. COLON, TRANSVERSE, POLYPECTOMY:  -  Tubular adenoma (1 of 2 fragments)  -  Benign colonic mucosa (1 of 2 fragments)  -  No high grade dysplasia or malignancy identified   FHx: neg for any gastrointestinal/liver disease, no malignancies Surgical: EVAR  Past Medical History: Past Medical History:  Diagnosis Date   A-fib (HCC)    AAA (abdominal aortic aneurysm) (HCC)    Followed by Dr. Tawanna Cooler Early   Arthritis    CAD (coronary artery disease)    a. CABG 2000.   Cancer [facility])    Prostate:  Radiation Tx   Carotid artery disease (HCC)    Chest pain    precordial. mild chronic .[name]... nonischemic   CHF (congestive heart failure) (HCC)    Chronic edema    Coronary artery disease    a. Nuclear, January, 2008, no ischemia b. 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 Arbie Cookey   Sinus bradycardia    Asymptomatic    Past Surgical History: Past Surgical History:  Procedure Laterality Date   ABDOMINAL AORTIC ENDOVASCULAR STENT GRAFT N/A 09/26/2022   Procedure: ABDOMINAL AORTIC ENDOVASCULAR STENT GRAFT;  Surgeon: Cephus Shelling, MD;  Location: [facility] OR;  Service: Vascular;  Laterality: N/A;   AORTOGRAM  09/26/2022   Procedure: AORTOGRAM;   Surgeon: Cephus Shelling, MD;  Location: [facility] OR;  Service: Vascular;;   CARDIAC CATHETERIZATION  09/26/2012   1/4 patent bypass (occluded SVG-PDA, SVG-OM, LIMA-LAD), SVG-diagonal patent and fills the diagonal and LAD, distal RCA occlusion with left to right collateralization, patent circumflex, LAD with no flow-limiting disease and antegrade flow competitively from SVG-diagonal; EF 60-65%   COLONOSCOPY  11/30/2009   ZOX:[id number]. next TCS 11/2019   COLONOSCOPY WITH PROPOFOL N/A 12/18/2019   Procedure: COLONOSCOPY WITH PROPOFOL;  Surgeon: Dolores Frame, MD;  Location: [facility] ENDO SUITE;  Service: Gastroenterology;  Laterality: N/A;  1030   CORONARY ARTERY BYPASS GRAFT  2000   CYSTOSCOPY N/A 08/31/2021   Procedure: CYSTOSCOPY;  Surgeon: Malen Gauze, MD;  Location: [facility] ORS;  Service: Urology;  Laterality: N/A;  pt knows to arrive at 7:00   CYSTOSCOPY WITH FULGERATION N/A 03/22/2022   Procedure: CYSTOSCOPY WITH FULGERATION;  Surgeon: Malen Gauze, MD;  Location: [facility] ORS;  Service: Urology;  Laterality: N/A;   ENDARTERECTOMY FEMORAL Right 09/26/2022   Procedure: RIGHT COMMON FEMORAL ENDARTERECTOMY WITH BOVINE PATCH;  Surgeon: Cephus Shelling, MD;  Location: [facility] OR;  Service: Vascular;  Laterality: Right;   ESOPHAGOGASTRODUODENOSCOPY N/A 08/12/2013   [id number] peptic stricture with erosive refluxesophagitis - status post Maloney dilation. Hiatal hernia. Abnormalgastric mucosa. Deformity of the pyloric channel suggestive ofprior peptic ulcer disease. Duodenal bulbar diverticulum Statuspost gastric biopsy. h.pylori   LEFT HEART CATHETERIZATION WITH CORONARY/GRAFT ANGIOGRAM N/A 09/26/2012   Procedure: LEFT HEART CATHETERIZATION WITH Isabel Caprice;  Surgeon: Kathleene Hazel, MD;  Location: [facility] CATH LAB;  Service: Cardiovascular;  Laterality: N/A;   MALONEY DILATION N/A 08/12/2013   Procedure: Elease Hashimoto DILATION;  Surgeon: Corbin Ade, MD;   [facility]: [facility];  Service: Endoscopy;  Laterality: N/A;   POLYPECTOMY  12/18/2019   Procedure: POLYPECTOMY;  Surgeon: Dolores Frame, MD;  Location: [facility];  Service: Gastroenterology;;  ascending colon polyp    PR VEIN BYPASS GRAFT,AORTO-FEM-POP Right 07/19/1999   PR VEIN BYPASS GRAFT,AORTO-FEM-POP Left 05/02/2006   RIGHT HEART CATH AND CORONARY/GRAFT ANGIOGRAPHY N/A 04/11/2020   Procedure: RIGHT HEART CATH AND CORONARY/GRAFT ANGIOGRAPHY;  Surgeon: Laurey Morale, MD;  Location: [facility] INVASIVE CV LAB;  Service: Cardiovascular;  Laterality: N/A;   SAVORY DILATION N/A 08/12/2013   Procedure: SAVORY DILATION;  Surgeon: Corbin Ade, MD;  Location: [facility];  Service: Endoscopy;  Laterality: N/A;   TRANSURETHRAL RESECTION OF BLADDER TUMOR N/A 08/31/2021   Procedure: TRANSURETHRAL RESECTION OF BLADDER TUMOR (TURBT);  Surgeon: Malen Gauze, MD;  Location: [facility] ORS;  Service: Urology;  Laterality: N/A;   ULTRASOUND GUIDANCE FOR VASCULAR ACCESS Bilateral 09/26/2022   Procedure: ULTRASOUND GUIDANCE FOR VASCULAR ACCESS;  Surgeon: Cephus Shelling, MD;  Location: [facility] OR;  Service: Vascular;  Laterality: Bilateral;   VASCULAR SURGERY     WRIST SURGERY     cyst removal    Family History: Family History  Problem Relation Age of Onset   Deep vein thrombosis  Father    Lung cancer Sister    Diabetes Sister    Heart disease Sister        After age 18   Hyperlipidemia Sister    Hypertension Sister    Lung cancer Sister    Breast cancer Sister    Hypertension Mother    Diabetes Sister    Coronary artery disease Other        family hx of   Cancer Brother        "crab cancer"   Heart disease Brother    Heart attack Brother    Heart attack Daughter    Colon cancer Neg Hx    Stroke Neg Hx     Social History: Social History   Tobacco Use  Smoking Status Former   Current packs/day: 0.00   Average packs/day: 2.0 packs/day for 70.0 years (140.0 ttl pk-yrs)    Types: Cigarettes   Start date: 04/21/1948   Quit date: 04/21/2018   Years since quitting: 4.8  Smokeless Tobacco Never   Social History   Substance and Sexual Activity  Alcohol Use Yes   Alcohol/week: 0.0 - 2.0 standard drinks of alcohol   Comment: OCCASIONAL   Social History   Substance and Sexual Activity  Drug Use Never    Allergies: Allergies  Allergen Reactions   Niacin Itching and Rash    Burning sensation   Contrast Media [Iodinated Contrast Media] Nausea And Vomiting    Per patient vocal cords thick with increased phlegm    Gemtesa [Vibegron] Other (See Comments)    UTI    Iodine-131 Nausea And Vomiting   Jardiance [Empagliflozin] Other (See Comments)    UTI   Myrbetriq [Mirabegron] Other (See Comments)    UTI   Nsaids Other (See Comments)    Taking Coumadin    Medications: Current Outpatient Medications  Medication Sig Dispense Refill   acetaminophen (TYLENOL) 325 MG tablet Take 2 tablets (650 mg total) by mouth every 6 (six) hours as needed for mild pain, fever or headache (or Fever >/= 101). 12 tablet 0   albuterol (VENTOLIN HFA) 108 (90 Base) MCG/ACT inhaler Inhale 2 puffs into the lungs every 6 (six) hours as needed for wheezing or shortness of breath. 1 each 2   aspirin 81 MG EC tablet Take 1 tablet (81 mg total) by mouth daily with breakfast. 30 tablet 3   atorvastatin (LIPITOR) 40 MG tablet Take 1 tablet (40 mg total) by mouth daily. 90 tablet 1   Cholecalciferol (VITAMIN D3) 50 MCG (2000 UT) capsule Take 2,000 Units by mouth daily.     dutasteride (AVODART) 0.5 MG capsule Take 1 capsule by mouth once daily 30 capsule 0   Incontinence Supplies (BARD [place] INCONTIN CLAMP) MISC 1 Units by Does not apply route daily. 1 each 0   ipratropium (ATROVENT) 0.03 % nasal spray Place 2 sprays into both nostrils 2 (two) times daily as needed for rhinitis.     isosorbide mononitrate (IMDUR) 60 MG 24 hr tablet Take 2 tablets (120 mg total) by mouth every  evening. 180 tablet 3   latanoprost (XALATAN) 0.005 % ophthalmic solution Place 1 drop into both eyes at bedtime.     levocetirizine (XYZAL) 5 MG tablet Take 5 mg by mouth every evening.     loratadine (CLARITIN) 10 MG tablet Take 10 mg by mouth daily.     losartan (COZAAR) 25 MG tablet Take 1 tablet (25 mg total) by mouth daily. 30 tablet 4  metFORMIN (GLUCOPHAGE) 500 MG tablet Take 500 mg by mouth 2 (two) times daily with a meal.     nitrofurantoin (MACRODANTIN) 50 MG capsule Take 1 capsule (50 mg total) by mouth at bedtime. 30 capsule 5   nitroGLYCERIN (NITROSTAT) 0.4 MG SL tablet Place 1 tablet (0.4 mg total) under the tongue every 5 (five) minutes x 3 doses as needed for chest pain. 25 tablet 3   oxyCODONE-acetaminophen (PERCOCET) 5-325 MG tablet Take 1 tablet by mouth every 6 (six) hours as needed for severe pain. 30 tablet 0   OXYGEN Inhale 3 L into the lungs at bedtime.     pantoprazole (PROTONIX) 40 MG tablet Take 1 tablet (40 mg total) by mouth daily. Take 30-60 min before first meal of the day     spironolactone (ALDACTONE) 25 MG tablet Take 1 tablet (25 mg total) by mouth daily. 90 tablet 3   tamsulosin (FLOMAX) 0.4 MG CAPS capsule Take 1 capsule (0.4 mg total) by mouth daily after supper. 90 capsule 3   torsemide (DEMADEX) 10 MG tablet Take 1 tablet (10 mg total) by mouth every 3 (three) days.     triamcinolone cream (KENALOG) 0.1 % Apply 1 Application topically 2 (two) times daily.     warfarin (COUMADIN) 2 MG tablet Take 2 mg by mouth daily.     No current facility-administered medications for this visit.    Review of Systems: GENERAL: negative for malaise, night sweats HEENT: No changes in hearing or vision, no nose bleeds or other nasal problems. NECK: Negative for lumps, goiter, pain and significant neck swelling RESPIRATORY: Negative for cough, wheezing CARDIOVASCULAR: Negative for chest pain, leg swelling, palpitations, orthopnea GI: SEE HPI MUSCULOSKELETAL: Negative  for joint pain or swelling, back pain, and muscle pain. SKIN: Negative for lesions, rash HEMATOLOGY Negative for prolonged bleeding, bruising easily, and swollen nodes. ENDOCRINE: Negative for cold or heat intolerance, polyuria, polydipsia and goiter. NEURO: negative for tremor, gait imbalance, syncope and seizures. The remainder of the review of systems is noncontributory.   Physical Exam: BP (!) 167/73   Pulse 73   Temp 97.6 F (36.4 C) (Oral)   Ht 5' 10.5" (1.791 m)   Wt 203 lb 11.2 oz (92.4 kg)   BMI 28.81 kg/m  GENERAL: The patient is AO x3, in no acute distress. HEENT: Head is normocephalic and atraumatic. EOMI are intact. Mouth is well hydrated and without lesions. NECK: Supple. No masses LUNGS: Clear to auscultation. No presence of rhonchi/wheezing/rales. Adequate chest expansion HEART: RRR, normal s1 and s2. ABDOMEN: Soft, nontender, no guarding, no peritoneal signs, and nondistended. BS +. No masses.   Imaging/Labs: as above     Latest Ref Rng & Units 12/11/2022    2:54 PM 10/10/2022    2:44 PM 10/04/2022    5:46 AM  CBC  WBC 4.0 - 10.5 K/uL 8.3  14.5  8.8   Hemoglobin 13.0 - 17.0 g/dL 81.1  9.8  7.5   Hematocrit 39.0 - 52.0 % 39.9  31.5  23.7   Platelets 150 - 400 K/uL 241  471  208    Lab Results  Component Value Date   IRON 31 (L) 10/10/2022   TIBC 333 10/10/2022   FERRITIN 144 10/10/2022    I personally reviewed and interpreted the available labs, imaging and endoscopic files. CT angio Abdomen    1. Interval successful endovascular aortic repair of saccular infrarenal abdominal aortic aneurysm. The aneurysm sac is successfully excluded and there is no evidence  of endoleak. Maximal aortic diameter decreased to 7.6 cm from 7.8 cm pre repair. 2. Note is made of relatively compressed and angulated left iliac limb relative to the right. No evidence of thrombus buildup within the graft. 3. Extensive atherosclerotic vascular disease with multiple areas  of stenosis as detailed above. No change from a recent prior imaging.   NON-VASCULAR   1. Circumscribed 3.7 x 2.9 cm fluid collection overlying the right common femoral artery likely represents residua of hematoma or potentially a seroma. No evidence of pseudoaneurysm. No imaging findings to suggest abscess/infection. 2. Ancillary findings as above without significant interval change.    Hemoglobin 12.2 platelet 241 BUN 25/1.34 Impression and Plan:   Troy [name] is a 82 y.o. male with AAA s/p EVAR 09/26/2022 , coronary artery disease status post CABG, carotid artery disease, diabetes, hyperlipidemia, GERD, hypertension, and atrial fibrillation on warfarin ,  who presents for evaluation of Dysphagia, hematochezia , GERD ,  Constipation and surveillance colonoscopy .  #History of Colon polyps #Hematochezia  Patient last colonoscopy 2021 with 6 polyps removed suggest repeat 3 years.  He is due for colonoscopy now  Also recent hematochezia could be hemorrhoidal bleed with underlying constipation less likely malignancy as he had colonoscopy 2021  Patient has significant comorbidities, we discussed risk indication and benefit of pursuing colonoscopy given patient age and comorbidities.  Patient would like to pursue colonoscopy as he reports he had a done in 2021 and went well  He had to be bridged at that time with Lovenox 1 mg/kg after stopping his Coumadin 5 days before procedure.  Will obtain cardiology clearance, restratification and optimization and request possibly to bridge to Lovenox  #Dysphagia #GERD #Peptic stricture  Patient has intermittent solid food dysphagia last upper endoscopy with peptic stricture in 2015.  With worsening dysphagia there could be progression of peptic stricture but need to rule out malignancy as well in a patient with advanced age and chronic GERD  Will plan for upper endoscopy plus or minus dilation.  Will need to hold warfarin for 5 days and  possibly bridged to Lovenox, will request cardiology input on this  PPI daily   #Constipation  Ensure adequate fluid intake: Aim for 8 glasses of water daily. Follow a high fiber diet: Include foods such as dates, prunes, pears, and kiwi. Take Miralax twice a day for the first week, then reduce to once daily thereafter. Use Metamucil twice a day.   All questions were answered.      Troy Lawman, MD Gastroenterology and Hepatology [facility] Gastroenterology   This chart has been completed using [employer] Dictation software, and while attempts have been made to ensure accuracy , certain words and phrases may not be transcribed as intended

## 2023-02-14 ENCOUNTER — Other Ambulatory Visit: Payer: Self-pay | Admitting: Urology

## 2023-02-14 DIAGNOSIS — R310 Gross hematuria: Secondary | ICD-10-CM

## 2023-02-15 NOTE — Telephone Encounter (Signed)
   Patient Name: Troy [name]  DOB: April 11, 1940 MRN: [id number]  Primary Cardiologist: Prentice Docker, MD (Inactive)  Chart reviewed as part of pre-operative protocol coverage. Given past medical history and time since last visit, based on ACC/AHA guidelines, JEBEDIAH MAGNER is at acceptable risk for the planned procedure without further cardiovascular testing.   Per 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  Probably worthwhile for them to get clearance from pulmonary also."  Per office protocol, patient can hold warfarin for 5 days prior to procedure.   Patient will not need bridging with Lovenox (enoxaparin) around procedure.   We do not monitor his warfarin - but based on our protocol he would not need to be bridged with enoxaparin.      I will route this recommendation to the requesting party via Epic fax function and remove from pre-op pool.  Please call with questions.  Joylene Grapes, NP 02/15/2023, 12:08 PM

## 2023-02-15 NOTE — Telephone Encounter (Signed)
Patient with diagnosis of atrial fibrillation on warfarin for anticoagulation.    Procedure: EGD/colonoscopy Date of procedure: 03/14/23   CHA2DS2-VASc Score = 6   This indicates a 9.7% annual risk of stroke. The patient's score is based upon: CHF History: 1 HTN History: 1 Diabetes History: 1 Stroke History: 0 Vascular Disease History: 1 Age Score: 2 Gender Score: 0    CrCl 58 Platelet count 241  Per office protocol, patient can hold warfarin for 5 days prior to procedure.   Patient will not need bridging with Lovenox (enoxaparin) around procedure.  We do not monitor his warfarin - but based on our protocol he would not need to be bridged with enoxaparin.     **This guidance is not considered finalized until pre-operative APP has relayed final recommendations.**

## 2023-02-21 NOTE — Telephone Encounter (Signed)
Clearance sent to Dr.Wert Pulmonology

## 2023-02-21 NOTE — Telephone Encounter (Signed)
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

## 2023-02-27 ENCOUNTER — Ambulatory Visit: Payer: No Typology Code available for payment source | Admitting: Urology

## 2023-02-27 DIAGNOSIS — Z87438 Personal history of other diseases of male genital organs: Secondary | ICD-10-CM

## 2023-02-27 DIAGNOSIS — N453 Epididymo-orchitis: Secondary | ICD-10-CM

## 2023-02-27 DIAGNOSIS — R310 Gross hematuria: Secondary | ICD-10-CM

## 2023-02-27 DIAGNOSIS — N5082 Scrotal pain: Secondary | ICD-10-CM

## 2023-02-27 LAB — URINALYSIS, ROUTINE W REFLEX MICROSCOPIC
Bilirubin, UA: NEGATIVE
Glucose, UA: NEGATIVE
Ketones, UA: NEGATIVE
Nitrite, UA: NEGATIVE
Protein,UA: NEGATIVE
Specific Gravity, UA: 1.01 (ref 1.005–1.030)
Urobilinogen, Ur: 0.2 mg/dL (ref 0.2–1.0)
pH, UA: 6 (ref 5.0–7.5)

## 2023-02-27 LAB — MICROSCOPIC EXAMINATION: Bacteria, UA: NONE SEEN

## 2023-02-27 MED ORDER — DUTASTERIDE 0.5 MG PO CAPS: 0.5000 mg | ORAL_CAPSULE | Freq: Every day | ORAL | 11 refills | Status: AC

## 2023-02-27 MED ORDER — NITROFURANTOIN MACROCRYSTAL 50 MG PO CAPS: 50.0000 mg | ORAL_CAPSULE | Freq: Every day | ORAL | 5 refills | Status: DC

## 2023-02-27 NOTE — Progress Notes (Signed)
02/27/2023 2:01 PM   EMILY KRELLER 07-11-1940 [id number]  Referring provider: Assunta Found, MD [address] [place],  [place] [address]  Followup epididymitis   HPI: Mr Brosius is a 81yo here for followup for epididymitis and urinary incontinence. He has intermittent right testis pain. No new scrotal swelling. He is on macrodantin prophylaxis. He continues to have urinary incontinence and cannot tolerate the Cunningham clamp    PMH: Past Medical History:  Diagnosis Date   A-fib [facility])    AAA (abdominal aortic aneurysm) (HCC)    Followed by Dr. Tawanna Cooler Early   Arthritis    CAD (coronary artery disease)    a. CABG 2000.   Cancer [facility])    Prostate:  Radiation Tx   Carotid artery disease (HCC)    Chest pain    precordial. mild chronic .[name]... nonischemic   CHF (congestive heart failure) (HCC)    Chronic edema    Coronary artery disease    a. Nuclear, January, 2008, no ischemia b. 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 Arbie Cookey   Sinus bradycardia    Asymptomatic    Surgical History: Past Surgical History:  Procedure Laterality Date   ABDOMINAL AORTIC ENDOVASCULAR STENT GRAFT N/A 09/26/2022   Procedure: ABDOMINAL AORTIC ENDOVASCULAR STENT GRAFT;  Surgeon: Cephus Shelling, MD;  Location: [facility] OR;  Service: Vascular;  Laterality: N/A;   AORTOGRAM  09/26/2022   Procedure: AORTOGRAM;  Surgeon: Cephus Shelling, MD;  Location:  [facility] OR;  Service: Vascular;;   CARDIAC CATHETERIZATION  09/26/2012   1/4 patent bypass (occluded SVG-PDA, SVG-OM, LIMA-LAD), SVG-diagonal patent and fills the diagonal and LAD, distal RCA occlusion with left to right collateralization, patent circumflex, LAD with no flow-limiting disease and antegrade flow competitively from SVG-diagonal; EF 60-65%   COLONOSCOPY  11/30/2009   VZD:GLOVFIEPPIRJJO. next TCS 11/2019   COLONOSCOPY WITH PROPOFOL N/A 12/18/2019   Procedure: COLONOSCOPY WITH PROPOFOL;  Surgeon: Dolores Frame, MD;  Location: [facility] ENDO SUITE;  Service: Gastroenterology;  Laterality: N/A;  1030   CORONARY ARTERY BYPASS GRAFT  2000   CYSTOSCOPY N/A 08/31/2021   Procedure: CYSTOSCOPY;  Surgeon: Malen Gauze, MD;  Location: [facility] ORS;  Service: Urology;  Laterality: N/A;  pt knows to arrive at 7:00   CYSTOSCOPY WITH FULGERATION N/A 03/22/2022   Procedure: CYSTOSCOPY WITH FULGERATION;  Surgeon: Malen Gauze, MD;  Location: [facility] ORS;  Service: Urology;  Laterality: N/A;   ENDARTERECTOMY FEMORAL Right 09/26/2022   Procedure: RIGHT COMMON FEMORAL ENDARTERECTOMY WITH BOVINE PATCH;  Surgeon: Cephus Shelling, MD;  Location: [facility] OR;  Service: Vascular;  Laterality: Right;   ESOPHAGOGASTRODUODENOSCOPY N/A 08/12/2013   ACZ:[id number] peptic stricture with erosive refluxesophagitis - status post Maloney dilation. Hiatal hernia. Abnormalgastric mucosa. Deformity of the pyloric channel suggestive ofprior peptic ulcer disease. Duodenal bulbar diverticulum  Statuspost gastric biopsy. h.pylori   LEFT HEART CATHETERIZATION WITH CORONARY/GRAFT ANGIOGRAM N/A 09/26/2012   Procedure: LEFT HEART CATHETERIZATION WITH Isabel Caprice;  Surgeon: Kathleene Hazel, MD;  Location: [facility] CATH LAB;  Service: Cardiovascular;  Laterality: N/A;   MALONEY DILATION N/A 08/12/2013   Procedure: Elease Hashimoto DILATION;  Surgeon: Corbin Ade, MD;  [facility];  Service: Endoscopy;   Laterality: N/A;   POLYPECTOMY  12/18/2019   Procedure: POLYPECTOMY;  Surgeon: Dolores Frame, MD;  Location: [facility];  Service: Gastroenterology;;  ascending colon polyp    PR VEIN BYPASS GRAFT,AORTO-FEM-POP Right 07/19/1999   PR VEIN BYPASS GRAFT,AORTO-FEM-POP Left 05/02/2006   RIGHT HEART CATH AND CORONARY/GRAFT ANGIOGRAPHY N/A 04/11/2020   Procedure: RIGHT HEART CATH AND CORONARY/GRAFT ANGIOGRAPHY;  Surgeon: Laurey Morale, MD;  Location: [facility] INVASIVE CV LAB;  Service: Cardiovascular;  Laterality: N/A;   SAVORY DILATION N/A 08/12/2013   Procedure: SAVORY DILATION;  Surgeon: Corbin Ade, MD;  [facility];  Service: Endoscopy;  Laterality: N/A;   TRANSURETHRAL RESECTION OF BLADDER TUMOR N/A 08/31/2021   Procedure: TRANSURETHRAL RESECTION OF BLADDER TUMOR (TURBT);  Surgeon: Malen Gauze, MD;  Location: [facility] ORS;  Service: Urology;  Laterality: N/A;   ULTRASOUND GUIDANCE FOR VASCULAR ACCESS Bilateral 09/26/2022   Procedure: ULTRASOUND GUIDANCE FOR VASCULAR ACCESS;  Surgeon: Cephus Shelling, MD;  Location: [facility] OR;  Service: Vascular;  Laterality: Bilateral;   VASCULAR SURGERY     WRIST SURGERY     cyst removal    Home Medications:  Allergies as of 02/27/2023       Reactions   Niacin Itching, Rash   Burning sensation   Contrast Media [iodinated Contrast Media] Nausea And Vomiting   Per patient vocal cords thick with increased phlegm    Gemtesa [vibegron] Other (See Comments)   UTI    Iodine-131 Nausea And Vomiting   Jardiance [empagliflozin] Other (See Comments)   UTI   Myrbetriq [mirabegron] Other (See Comments)   UTI   Nsaids Other (See Comments)   Taking Coumadin        Medication List        Accurate as of February 27, 2023  2:01 PM. If you have any questions, ask your nurse or doctor.          acetaminophen 325 MG tablet Commonly known as: TYLENOL Take 2 tablets (650 mg total) by mouth every 6 (six) hours as needed for  mild pain, fever or headache (or Fever >/= 101).   albuterol 108 (90 Base) MCG/ACT inhaler Commonly known as: VENTOLIN HFA Inhale 2 puffs into the lungs every 6 (six) hours as needed for wheezing or shortness of breath.   aspirin EC 81 MG tablet Take 1 tablet (81 mg total) by mouth daily with breakfast.   atorvastatin 40 MG tablet Commonly known as: LIPITOR Take 1 tablet (40 mg total) by mouth daily.   Bard Cunningham Incontin Clamp Misc 1 Units by Does not apply route daily.   Clenpiq 10-3.5-12 MG-GM -GM/175ML Soln Generic drug: Sod Picosulfate-Mag Ox-Cit Acd Take 1 kit by mouth as directed.   dutasteride 0.5 MG capsule Commonly known as: AVODART Take 1 capsule by mouth once daily   ipratropium 0.03 % nasal spray Commonly known as: ATROVENT Place 2 sprays into both nostrils 2 (two) times daily as needed for rhinitis.   isosorbide mononitrate 60 MG 24 hr tablet Commonly known as: IMDUR Take 2 tablets (120 mg total) by mouth every evening.  latanoprost 0.005 % ophthalmic solution Commonly known as: XALATAN Place 1 drop into both eyes at bedtime.   levocetirizine 5 MG tablet Commonly known as: XYZAL Take 5 mg by mouth every evening.   loratadine 10 MG tablet Commonly known as: CLARITIN Take 10 mg by mouth daily.   losartan 25 MG tablet Commonly known as: COZAAR Take 1 tablet (25 mg total) by mouth daily.   metFORMIN 500 MG tablet Commonly known as: GLUCOPHAGE Take 500 mg by mouth 2 (two) times daily with a meal.   nitrofurantoin 50 MG capsule Commonly known as: Macrodantin Take 1 capsule (50 mg total) by mouth at bedtime.   nitroGLYCERIN 0.4 MG SL tablet Commonly known as: NITROSTAT Place 1 tablet (0.4 mg total) under the tongue every 5 (five) minutes x 3 doses as needed for chest pain.   oxyCODONE-acetaminophen 5-325 MG tablet Commonly known as: Percocet Take 1 tablet by mouth every 6 (six) hours as needed for severe pain.   OXYGEN Inhale 3 L into the  lungs at bedtime.   pantoprazole 40 MG tablet Commonly known as: PROTONIX Take 1 tablet (40 mg total) by mouth daily. Take 30-60 min before first meal of the day   spironolactone 25 MG tablet Commonly known as: ALDACTONE Take 1 tablet (25 mg total) by mouth daily.   tamsulosin 0.4 MG Caps capsule Commonly known as: FLOMAX TAKE 1 CAPSULE EVERY DAY AFTER SUPPER   torsemide 10 MG tablet Commonly known as: DEMADEX Take 1 tablet (10 mg total) by mouth every 3 (three) days.   triamcinolone cream 0.1 % Commonly known as: KENALOG Apply 1 Application topically 2 (two) times daily.   Vitamin D3 50 MCG (2000 UT) capsule Take 2,000 Units by mouth daily.   warfarin 2 MG tablet Commonly known as: COUMADIN Take 2 mg by mouth daily.        Allergies:  Allergies  Allergen Reactions   Niacin Itching and Rash    Burning sensation   Contrast Media [Iodinated Contrast Media] Nausea And Vomiting    Per patient vocal cords thick with increased phlegm    Gemtesa [Vibegron] Other (See Comments)    UTI    Iodine-131 Nausea And Vomiting   Jardiance [Empagliflozin] Other (See Comments)    UTI   Myrbetriq [Mirabegron] Other (See Comments)    UTI   Nsaids Other (See Comments)    Taking Coumadin    Family History: Family History  Problem Relation Age of Onset   Deep vein thrombosis Father    Lung cancer Sister    Diabetes Sister    Heart disease Sister        After age 55   Hyperlipidemia Sister    Hypertension Sister    Lung cancer Sister    Breast cancer Sister    Hypertension Mother    Diabetes Sister    Coronary artery disease Other        family hx of   Cancer Brother        "crab cancer"   Heart disease Brother    Heart attack Brother    Heart attack Daughter    Colon cancer Neg Hx    Stroke Neg Hx     Social History:  reports that he quit smoking about 4 years ago. His smoking use included cigarettes. He started smoking about 74 years ago. He has a 140 pack-year  smoking history. He has never used smokeless tobacco. He reports current alcohol use. He reports that he  does not use drugs.  ROS: All other review of systems were reviewed and are negative except what is noted above in HPI  Physical Exam: There were no vitals taken for this visit.  Constitutional:  Alert and oriented, No acute distress. HEENT:  AT, moist mucus membranes.  Trachea midline, no masses. Cardiovascular: No clubbing, cyanosis, or edema. Respiratory: Normal respiratory effort, no increased work of breathing. GI: Abdomen is soft, nontender, nondistended, no abdominal masses GU: No CVA tenderness.  Lymph: No cervical or inguinal lymphadenopathy. Skin: No rashes, bruises or suspicious lesions. Neurologic: Grossly intact, no focal deficits, moving all 4 extremities. Psychiatric: Normal mood and affect.  Laboratory Data: Lab Results  Component Value Date   WBC 8.3 12/11/2022   HGB 12.2 (L) 12/11/2022   HCT 39.9 12/11/2022   MCV 89.9 12/11/2022   PLT 241 12/11/2022    Lab Results  Component Value Date   CREATININE 1.34 (H) 12/11/2022    No results found for: "PSA"  No results found for: "TESTOSTERONE"  Lab Results  Component Value Date   HGBA1C 7.5 (H) 09/17/2022    Urinalysis    Component Value Date/Time   COLORURINE YELLOW 09/30/2022 1810   APPEARANCEUR Cloudy (A) 01/16/2023 1159   LABSPEC 1.006 09/30/2022 1810   PHURINE 7.0 09/30/2022 1810   GLUCOSEU Negative 01/16/2023 1159   HGBUR SMALL (A) 09/30/2022 1810   BILIRUBINUR Negative 01/16/2023 1159   KETONESUR NEGATIVE 09/30/2022 1810   PROTEINUR 2+ (A) 01/16/2023 1159   PROTEINUR NEGATIVE 09/30/2022 1810   NITRITE Negative 01/16/2023 1159   NITRITE NEGATIVE 09/30/2022 1810   LEUKOCYTESUR 2+ (A) 01/16/2023 1159   LEUKOCYTESUR NEGATIVE 09/30/2022 1810    Lab Results  Component Value Date   LABMICR See below: 01/16/2023   WBCUA >30 (A) 01/16/2023   LABEPIT 0-10 01/16/2023   MUCUS Present (A)  01/16/2023   BACTERIA Few (A) 01/16/2023    Pertinent Imaging: *** No results found for this or any previous visit.  No results found for this or any previous visit.  No results found for this or any previous visit.  No results found for this or any previous visit.  No results found for this or any previous visit.  No valid procedures specified. Results for orders placed during the hospital encounter of 08/18/21  CT HEMATURIA WORKUP  Narrative CLINICAL DATA:  Gross hematuria since early May, history of prostate cancer * Tracking Code: BO *  EXAM: CT ABDOMEN AND PELVIS WITHOUT AND WITH CONTRAST  TECHNIQUE: Multidetector CT imaging of the abdomen and pelvis was performed following the standard protocol before and following the bolus administration of intravenous contrast.  RADIATION DOSE REDUCTION: This exam was performed according to the departmental dose-optimization program which includes automated exposure control, adjustment of the mA and/or kV according to patient size and/or use of iterative reconstruction technique.  CONTRAST:  OMNIPAQUE IOHEXOL 300 MG/ML  SOLN  COMPARISON:  None Available.  FINDINGS: Lower chest: No acute abnormality. Cardiomegaly. Coronary artery calcifications.  Hepatobiliary: No solid liver abnormality is seen. Small, definitively benign cyst or hemangioma of the central liver dome for which no further follow-up or characterization is required (series 15, image 30). No gallstones, gallbladder wall thickening, or biliary dilatation.  Pancreas: Unremarkable. No pancreatic ductal dilatation or surrounding inflammatory changes.  Spleen: Normal in size without significant abnormality.  Adrenals/Urinary Tract: Adrenal glands are unremarkable. Small simple, benign bilateral renal cortical cysts, as well as intrinsically hyperdense, nonenhancing hemorrhagic or proteinaceous cysts of the midportions  of the left and right kidneys  (series 2, image 37). No urinary tract filling defect on delayed phase imaging. Bladder is unremarkable.  Stomach/Bowel: Stomach is within normal limits. Appendix appears normal. No evidence of bowel wall thickening, distention, or inflammatory changes. Descending and sigmoid diverticulosis.  Vascular/Lymphatic: Aortic atherosclerosis. 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 No enlarged abdominal or pelvic lymph nodes.  Reproductive: Prostate brachytherapy.  Other: Small, fat containing bilateral inguinal hernias. No ascites.  Musculoskeletal: No acute or significant osseous findings.  IMPRESSION: 1. No evidence of urinary tract mass, calculus, or hydronephrosis. No urinary tract filling defect on delayed phase imaging. 2. Prostate brachytherapy. No evidence of mass or lymphadenopathy in the abdomen or pelvis. 3. Small simple, benign bilateral renal cortical cysts, as well as nonenhancing, benign hemorrhagic or proteinaceous cysts. No further follow-up or characterization is required for these benign renal cysts. 4. 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 Recommend referral to a vascular specialist. This recommendation follows ACR consensus guidelines: White Paper of the ACR Incidental Findings Committee II on Vascular Findings. J Am Coll Radiol 2013; 10:789-794. 5. Cardiomegaly and coronary artery disease.  Aortic Atherosclerosis  (ICD10-I70.0).   Electronically Signed By: Jearld Lesch M.D. On: 08/19/2021 11:29  No results found for this or any previous visit.   Assessment & Plan:    1. Scrotal pain -resolved - Urinalysis, Routine w reflex microscopic  2. Orchitis and epididymitis Continue macrodantin prophylaxis   No follow-ups on file.  Wilkie Aye, MD  [facility] [place]

## 2023-03-02 ENCOUNTER — Encounter: Payer: Self-pay | Admitting: Urology

## 2023-03-02 NOTE — Patient Instructions (Signed)
Urinary Incontinence Urinary incontinence refers to a condition in which a person is unable to control where and when to pass urine. A person with this condition will urinate involuntarily. This means that the person urinates when he or she does not mean to. What are the causes? This condition may be caused by: Medicines. Infections. Constipation. Overactive bladder muscles. Weak bladder muscles. Weak pelvic floor muscles. These muscles provide support for the bladder, intestine, and, in women, the uterus. Enlarged prostate in men. The prostate is a gland near the bladder. When it gets too big, it can pinch the urethra. With the urethra blocked, the bladder can weaken and lose the ability to empty properly. Surgery. Emotional factors, such as anxiety, stress, or post-traumatic stress disorder (PTSD). Spinal cord injury, nerve injury, or other neurological conditions. Pelvic organ prolapse. This happens in women when organs move out of place and into the vagina. This movement can prevent the bladder and urethra from working properly. What increases the risk? The following factors may make you more likely to develop this condition: Age. The older you are, the higher the risk. Obesity. Being physically inactive. Pregnancy and childbirth. Menopause. Diseases that affect the nerves or spinal cord. Long-term, or chronic, coughing. This can increase pressure on the bladder and pelvic floor muscles. What are the signs or symptoms? Symptoms may vary depending on the type of urinary incontinence you have. They include: A sudden urge to urinate, and passing urine involuntarily before you can get to a bathroom (urge incontinence). Suddenly passing urine when doing activities that force urine to pass, such as coughing, laughing, exercising, or sneezing (stress incontinence). Needing to urinate often but urinating only a small amount, or constantly dribbling urine (overflow incontinence). Urinating  because you cannot get to the bathroom in time due to a physical disability, such as arthritis or injury, or due to a communication or thinking problem, such as Alzheimer's disease (functional incontinence). How is this diagnosed? This condition may be diagnosed based on: Your medical history. A physical exam. Tests, such as: Urine tests. X-rays of your kidney and bladder. Ultrasound. CT scan. Cystoscopy. In this procedure, a health care provider inserts a tube with a light and camera (cystoscope) through the urethra and into the bladder to check for problems. Urodynamic testing. These tests assess how well the bladder, urethra, and sphincter can store and release urine. There are different types of urodynamic tests, and they vary depending on what the test is measuring. To help diagnose your condition, your health care provider may recommend that you keep a log of when you urinate and how much you urinate. How is this treated? Treatment for this condition depends on the type of incontinence that you have and its cause. Treatment may include: Lifestyle changes, such as: Quitting smoking. Maintaining a healthy weight. Staying active. Try to get 150 minutes of moderate-intensity exercise every week. Ask your health care provider which activities are safe for you. Eating a healthy diet. Avoid high-fat foods, like fried foods. Avoid refined carbohydrates like white bread and white rice. Limit how much alcohol and caffeine you drink. Increase your fiber intake. Healthy sources of fiber include beans, whole grains, and fresh fruits and vegetables. Behavioral changes, such as: Pelvic floor muscle exercises. Bladder training, such as lengthening the amount of time between bathroom breaks, or using the bathroom at regular intervals. Using techniques to suppress bladder urges. This can include distraction techniques or controlled breathing exercises. Medicines, such as: Medicines to relax the  bladder   muscles and prevent bladder spasms. Medicines to help slow or prevent the growth of a man's prostate. Botox injections. These can help relax the bladder muscles. Treatments, such as: Using pulses of electricity to help change bladder reflexes (electrical nerve stimulation). For women, using a medical device to prevent urine leaks. This is a small, tampon-like, disposable device that is inserted into the urethra. Injecting collagen or carbon beads (bulking agents) into the urinary sphincter. These can help thicken tissue and close the bladder opening. Surgery. Follow these instructions at home: Lifestyle Limit alcohol and caffeine. These can fill your bladder quickly and irritate it. Keep yourself clean to help prevent odors and skin damage. Ask your health care provider about special skin creams and cleansers that can protect the skin from urine. Consider wearing pads or adult diapers. Make sure to change them regularly, and always change them right after experiencing incontinence. General instructions Take over-the-counter and prescription medicines only as told by your health care provider. Use the bathroom about every 3-4 hours, even if you do not feel the need to urinate. Try to empty your bladder completely every time. After urinating, wait a minute. Then try to urinate again. Make sure you are in a relaxed position while urinating. If your incontinence is caused by nerve problems, keep a log of the medicines you take and the times you go to the bathroom. Keep all follow-up visits. This is important. Where to find more information [employer] of Diabetes and Digestive and Kidney Diseases: CarFlippers.tn American Urology Association: www.urologyhealth.org Contact a health care provider if: You have pain that gets worse. Your incontinence gets worse. Get help right away if: You have a fever or chills. You are unable to urinate. You have redness in your groin area or  down your legs. Summary Urinary incontinence refers to a condition in which a person is unable to control where and when to pass urine. This condition may be caused by medicines, infection, weak bladder muscles, weak pelvic floor muscles, enlargement of the prostate (in men), or surgery. Factors such as older age, obesity, pregnancy and childbirth, menopause, neurological diseases, and chronic coughing may increase your risk for developing this condition. Types of urinary incontinence include urge incontinence, stress incontinence, overflow incontinence, and functional incontinence. This condition is usually treated first with lifestyle and behavioral changes, such as quitting smoking, eating a healthier diet, and doing regular pelvic floor exercises. Other treatment options include medicines, bulking agents, medical devices, electrical nerve stimulation, or surgery. This information is not intended to replace advice given to you by your health care provider. Make sure you discuss any questions you have with your health care provider. Document Revised: 10/23/2019 Document Reviewed: 10/23/2019 Elsevier Patient Education  2024 [employer].

## 2023-03-04 ENCOUNTER — Encounter (INDEPENDENT_AMBULATORY_CARE_PROVIDER_SITE_OTHER): Payer: Self-pay

## 2023-03-06 NOTE — Telephone Encounter (Signed)
Spoke to patient and scheduled 4:00 appt 03/07/2023 at [facility] office.  He voiced his understanding.  Nothing further needed.

## 2023-03-06 NOTE — Telephone Encounter (Signed)
Would need ov for clearance

## 2023-03-06 NOTE — Telephone Encounter (Signed)
Ok to [employer] at 4 pm any day I'm in [facility] in afternoon

## 2023-03-06 NOTE — Telephone Encounter (Signed)
Spoke to patient. He is aware that an appointment is needed. He would like to be seen in [facility], however there is no availability prior to surgery.   Dr. Sherene Sires, please advise if okay to double book?

## 2023-03-07 ENCOUNTER — Encounter: Payer: Self-pay | Admitting: Internal Medicine

## 2023-03-07 ENCOUNTER — Ambulatory Visit (INDEPENDENT_AMBULATORY_CARE_PROVIDER_SITE_OTHER): Payer: No Typology Code available for payment source | Admitting: Internal Medicine

## 2023-03-07 ENCOUNTER — Ambulatory Visit (HOSPITAL_COMMUNITY)
Admission: RE | Admit: 2023-03-07 | Discharge: 2023-03-07 | Disposition: A | Discharge status: Home / Self Care | Payer: No Typology Code available for payment source | Source: Ambulatory Visit | Attending: Internal Medicine | Admitting: Internal Medicine

## 2023-03-07 VITALS — BP 161/70 | HR 70 | Ht 70.5 in | Wt 202.0 lb

## 2023-03-07 DIAGNOSIS — G4734 Idiopathic sleep related nonobstructive alveolar hypoventilation: Secondary | ICD-10-CM | POA: Diagnosis not present

## 2023-03-07 DIAGNOSIS — C61 Malignant neoplasm of prostate: Secondary | ICD-10-CM | POA: Insufficient documentation

## 2023-03-07 DIAGNOSIS — I10 Essential (primary) hypertension: Secondary | ICD-10-CM | POA: Insufficient documentation

## 2023-03-07 DIAGNOSIS — R0609 Other forms of dyspnea: Secondary | ICD-10-CM | POA: Insufficient documentation

## 2023-03-07 DIAGNOSIS — F17201 Nicotine dependence, unspecified, in remission: Secondary | ICD-10-CM | POA: Insufficient documentation

## 2023-03-07 DIAGNOSIS — R2689 Other abnormalities of gait and mobility: Secondary | ICD-10-CM | POA: Insufficient documentation

## 2023-03-07 DIAGNOSIS — N453 Epididymo-orchitis: Secondary | ICD-10-CM | POA: Insufficient documentation

## 2023-03-07 DIAGNOSIS — H2513 Age-related nuclear cataract, bilateral: Secondary | ICD-10-CM | POA: Insufficient documentation

## 2023-03-07 DIAGNOSIS — J449 Chronic obstructive pulmonary disease, unspecified: Secondary | ICD-10-CM | POA: Insufficient documentation

## 2023-03-07 NOTE — Assessment & Plan Note (Signed)
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

## 2023-03-07 NOTE — Patient Instructions (Signed)
Use albuterol 2 pffs right before going in surgery   If not completely better with voice and your breathing see your ENT doctor   Please remember to go to the  x-ray department  @  [facility] for your tests - we will call you with the results when they are available     Please schedule a follow up visit in 12 months but call sooner if needed

## 2023-03-07 NOTE — Progress Notes (Signed)
Troy [name], male    DOB: June 17, 1940,     MRN: [id number]   Brief patient profile:  89  yowm with known IHD quit smoking 04/1998 with onset around 2010 dysphagia and recurrent aspiration / pneumonia eval by Suszanne Conners with dx of paralyzed R VC last eval 04/12/20 doing "ok" per [facility] clinic referred to pulmonary clinic in [place]  06/07/2020 by Dr   Jearld Pies for recurrent asp and mild/mod Pulmonary hypertension     History of Present Illness  06/07/2020  Pulmonary/ 1st office eval/ Lilley [facility] / [name]  Chief Complaint  Patient presents with   Pulmonary Consult    Referred by Dr Marca Ancona.  Pt c/o trouble with his breathing since Dec 2021. He states has hx of recurrent PNA and aspiration. He gets winded "walking a long distance". He also c/o cough with white sputum. He has albuterol inhaler and neb and has not been using these.   Dyspnea:  Has not done much since last admit   using walker at home and able to do [employer] shopping leaning on cart  Cough: feels something hanging in throat, worse with swallowing food/ worse in am's assoc with sense of pnds  Keeps a can by bed > white mucus  Sleep: flat bed / one pillow on side  SABA use: not really feeling like he needs it now but has duoneb and hfa  02 stopped 3 weeks prior to OV  And no worse off it "can I turn it back in"  rec Try albuterol (the puffer one day, the nebulizer the next)  15 min before an activity that you know would make you short of breath  Change the way you take your acid pills:  Pantoprazole (protonix) 40 mg   Take  30-60 min before first meal of the day and Pepcid (famotidine)  20 mg one after the last meal until return to office - this is the best way to tell whether stomach acid is contributing to your problem.   GERD diet  Discuss any sinus problems with your  ENT (ENT is a sinus doctor ) Please schedule a follow up office visit in 6 weeks, call sooner if needed     02/07/2022  f/u ov/[place] office/Jossilyn Benda re:  VCD/  asp  on prn saba Chief Complaint  Patient presents with   Follow-up    Breathing/ coughing has been doing well.  Walking with walker today due to arthritis and back pain    Dyspnea:  uses 2 wheel walker  [facility] / back slows him as much as his breathing  Cough: 1-10 x at hs mucus is white no food / doing better with swallowing  Sleeping: flat bed /  2 pillows or 45 degree doesn't help  SABA use: not using  02: 3lpm  Covid status: vax all but the last  Rec No change in recommendations     03/07/2023  f/u ov/[place] office/Shereese Bonnie re: VCD/Asp maint on 3lpm hs   Chief Complaint  Patient presents with   Surgical Clearance   Dyspnea: using  2 wheeled walker, limited from walking  due to back and legs,  not breathing Cough: minimal dry assoc with sneezing / hoarseness  Sleeping: flat level bed with one pillows s   resp cc  SABA use: rarely  02:  3lpm hs only     No obvious day to day or daytime variability or assoc excess/ purulent sputum or mucus plugs or  hemoptysis or cp or chest tightness, subjective wheeze or overt  hb symptoms.    Also denies any obvious fluctuation of symptoms with weather or environmental changes or other aggravating or alleviating factors except as outlined above   No unusual exposure hx or h/o childhood pna/ asthma or knowledge of premature birth.  Current Allergies, Complete Past Medical History, Past Surgical History, Family History, and Social History were reviewed in [employer] record.  ROS  The following are not active complaints unless bolded Hoarseness, sore throat, dysphagia, dental problems, itching, sneezing,  nasal congestion or discharge of excess mucus or purulent secretions, ear ache,   fever, chills, sweats, unintended wt loss or wt gain, classically pleuritic or exertional cp,  orthopnea pnd or arm/hand swelling  or leg swelling, presyncope, palpitations, abdominal pain, anorexia, nausea,  vomiting, diarrhea  or change in bowel habits or change in bladder habits, change in stools or change in urine, dysuria, hematuria,  rash, arthralgias, visual complaints, headache, numbness, weakness or ataxia or problems with walking or coordination,  change in mood or  memory.        Current Meds  Medication Sig   acetaminophen (TYLENOL) 325 MG tablet Take 2 tablets (650 mg total) by mouth every 6 (six) hours as needed for mild pain, fever or headache (or Fever >/= 101).   albuterol (VENTOLIN HFA) 108 (90 Base) MCG/ACT inhaler Inhale 2 puffs into the lungs every 6 (six) hours as needed for wheezing or shortness of breath.   aspirin 81 MG EC tablet Take 1 tablet (81 mg total) by mouth daily with breakfast.   atorvastatin (LIPITOR) 40 MG tablet Take 1 tablet (40 mg total) by mouth daily.   Cholecalciferol (VITAMIN D3) 50 MCG (2000 UT) capsule Take 2,000 Units by mouth daily.   dutasteride (AVODART) 0.5 MG capsule Take 1 capsule (0.5 mg total) by mouth daily.   HYDROcodone-acetaminophen (NORCO) 10-325 MG tablet Take 1 tablet by mouth 2 (two) times daily as needed.   Incontinence Supplies (BARD [place] INCONTIN CLAMP) MISC 1 Units by Does not apply route daily.   ipratropium (ATROVENT) 0.03 % nasal spray Place 2 sprays into both nostrils 2 (two) times daily as needed for rhinitis.   isosorbide mononitrate (IMDUR) 60 MG 24 hr tablet Take 2 tablets (120 mg total) by mouth every evening.   latanoprost (XALATAN) 0.005 % ophthalmic solution Place 1 drop into both eyes at bedtime.   levocetirizine (XYZAL) 5 MG tablet Take 5 mg by mouth every evening.   loratadine (CLARITIN) 10 MG tablet Take 10 mg by mouth daily.   losartan (COZAAR) 25 MG tablet Take 1 tablet (25 mg total) by mouth daily.   metFORMIN (GLUCOPHAGE) 500 MG tablet Take 500 mg by mouth 2 (two) times daily with a meal.   metoprolol succinate (TOPROL-XL) 25 MG 24 hr tablet Take 25 mg by mouth daily.   nitrofurantoin (MACRODANTIN) 50 MG  capsule Take 1 capsule (50 mg total) by mouth at bedtime.   nitroGLYCERIN (NITROSTAT) 0.4 MG SL tablet Place 1 tablet (0.4 mg total) under the tongue every 5 (five) minutes x 3 doses as needed for chest pain.   oxyCODONE-acetaminophen (PERCOCET) 5-325 MG tablet Take 1 tablet by mouth every 6 (six) hours as needed for severe pain.   OXYGEN Inhale 3 L into the lungs at bedtime.   pantoprazole (PROTONIX) 40 MG tablet Take 1 tablet (40 mg total) by mouth daily. Take 30-60 min before first meal of the day  Sod Picosulfate-Mag Ox-Cit Acd (CLENPIQ) 10-3.5-12 MG-GM -GM/175ML SOLN Take 1 kit by mouth as directed.   torsemide (DEMADEX) 10 MG tablet Take 1 tablet (10 mg total) by mouth every 3 (three) days.   triamcinolone cream (KENALOG) 0.1 % Apply 1 Application topically 2 (two) times daily.   warfarin (COUMADIN) 2 MG tablet Take 2 mg by mouth daily.                         Past Medical History:  Diagnosis Date   AAA (abdominal aortic aneurysm) (HCC)    Followed by Dr. Tawanna Cooler Early   Arthritis    CAD (coronary artery disease)    a. CABG 2000.   Cancer [facility])    Prostate:  Radiation Tx   Carotid artery disease (HCC)    Chest pain    precordial. mild chronic .[name]... nonischemic   CHF (congestive heart failure) (HCC)    Chronic edema    Coronary artery disease    a. Nuclear, January, 2008, no ischemia b. Cath 08/2012- 1/4 patent grafts, RCA CTO, no flow-limiting disease, medically managed   Diabetes mellitus without complication (HCC)    Dizziness 02/2011   Dyslipidemia    GERD (gastroesophageal reflux disease)    TAKES TUMS & ROLAIDS AS NEEDED   Hx of CABG 2000   Hypertension    Myocardial infarction (HCC) 1999   Neck pain 02/2011   Pneumonia    Pulmonary hypertension (HCC)    Renal artery stenosis (HCC)    50-70%   S/P femoropopliteal bypass surgery    Dr. Arbie Cookey   Sinus bradycardia    Asymptomatic      Objective:    Wts  03/07/2023       202  02/07/2022       202    02/02/2021       207   07/20/20 205 lb 3.2 oz (93.1 kg)  06/07/20 205 lb 3.2 oz (93.1 kg)  05/25/20 205 lb (93 kg)    Vital signs reviewed  03/07/2023  - Note at rest 02 sats  95% on RA   General appearance:    chronically ill hoarse wm nad        HEENT : Oropharynx  clear    full dentures/ M1 airway     NECK :  without  apparent JVD/ palpable Nodes/TM    LUNGS: no acc muscle use,  Nl contour chest  with mild pseudowheeze better with PLM and clear lungs    CV:  RRR  no s3 or murmur or increase in P2, and no edema   ABD:  soft and nontender with nl inspiratory excursion in the supine position. No bruits or organomegaly appreciated   MS:  Nl gait/ ext warm without deformities Or obvious joint restrictions  calf tenderness, cyanosis or clubbing    SKIN: warm and dry without lesions    NEURO:  alert, approp, nl sensorium with  no motor or cerebellar deficits apparent.       CXR PA and Lateral:   03/07/2023 :    I personally reviewed images and impression is as follows:     CM with minimal increase markings, non-specific    Assessment

## 2023-03-07 NOTE — Assessment & Plan Note (Signed)
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 - [facility]  04/11/20 RA mean 6 RV 64/6 PA 65/18, mean 35 PCWP mean 14 Ao 112/45 Oxygen saturations: PA 62% AO 100% Cardiac Output (Fick) 5.71  Cardiac Index (Fick) 2.71 PVR 3.7 WU - PFT's  05/05/20  FEV1 2.87 (94 % ) ratio 0.76  p 0 % improvement from saba p o prior to study with DLCO  13.20 (53%) corrects to 2.46 (62 %)  for alv volume and FV curve   -  06/07/2020   Walked RA  approx   300 ft  @ slow to moderate pace  stopped due  to hip pain/no SOB with sats 93% at end    - ONO RA 06/16/20  desats x 4 h on RA > rec 2lpm and repeat (see noct hypoxemia)  - 02/02/2021 pt walked at a slow pace on room air. Reported SOB on the second lap. Stopped walk after second lap due to legs tired with lowest sats 96%  - 02/07/2022   Walked on RA  x  one  lap(s) =  approx 150  ft  @ slow/walker  pace, stopped due to back pain with lowest 02 sats 95%  - 03/07/2023   Walked on RA  x  2  lap(s) =  approx 300  ft  @ slow/ walkerpace, stopped due to fatigue with lowest 02 sats 96% but increased to 97% before stopping   He has certain deconditioning at this point but mostly upper airways dysfunction at this point which should improve with procedure to help his swallowing and if not needs to return to [facility]  for f/u  Pulmonary f/u can be yearly, call sooner if needed   He is cleared for endoscopy.         Each maintenance medication was reviewed in detail including emphasizing most importantly the difference between maintenance and prns and under what circumstances the prns are to be triggered using an action plan format where appropriate.  Total time for H and P, chart review, counseling, reviewing hfa device(s) , directly observing portions of ambulatory 02 saturation study/ and generating customized AVS unique to this office visit / same day charting > 30 min preop eval

## 2023-03-12 ENCOUNTER — Other Ambulatory Visit: Payer: Self-pay

## 2023-03-12 ENCOUNTER — Encounter (HOSPITAL_COMMUNITY): Payer: Self-pay

## 2023-03-12 ENCOUNTER — Encounter (HOSPITAL_COMMUNITY)
Admission: RE | Admit: 2023-03-12 | Discharge: 2023-03-12 | Disposition: A | Discharge status: Home / Self Care | Payer: No Typology Code available for payment source | Source: Ambulatory Visit | Attending: Gastroenterology | Admitting: Gastroenterology

## 2023-03-13 ENCOUNTER — Telehealth: Payer: Self-pay | Admitting: Internal Medicine

## 2023-03-13 NOTE — Telephone Encounter (Signed)
Spoke with patient regarding his visit with Dr. Sherene Sires on 03/07/23--we [facility] not [facility] a [facility] .  Patient authorization for this visit and we will file with his [employer],  Patient states he thought the [place] is the one that spoke with our office regarding the surgery, but it is fine to file with [employer].  I told him I would research and get back with him

## 2023-03-14 ENCOUNTER — Ambulatory Visit (HOSPITAL_COMMUNITY): Payer: No Typology Code available for payment source | Admitting: Anesthesiology

## 2023-03-14 ENCOUNTER — Encounter (HOSPITAL_COMMUNITY): Payer: Self-pay

## 2023-03-14 ENCOUNTER — Ambulatory Visit (HOSPITAL_COMMUNITY)
Admission: RE | Admit: 2023-03-14 | Discharge: 2023-03-14 | Disposition: A | Discharge status: Home / Self Care | Payer: No Typology Code available for payment source | Attending: Gastroenterology | Admitting: Gastroenterology

## 2023-03-14 ENCOUNTER — Encounter (INDEPENDENT_AMBULATORY_CARE_PROVIDER_SITE_OTHER): Payer: Self-pay | Admitting: *Deleted

## 2023-03-14 ENCOUNTER — Encounter (HOSPITAL_COMMUNITY)
Admission: RE | Disposition: A | Discharge status: Home / Self Care | Payer: Self-pay | Source: Home / Self Care | Attending: Gastroenterology

## 2023-03-14 ENCOUNTER — Other Ambulatory Visit: Payer: Self-pay

## 2023-03-14 DIAGNOSIS — K2970 Gastritis, unspecified, without bleeding: Secondary | ICD-10-CM

## 2023-03-14 DIAGNOSIS — R1310 Dysphagia, unspecified: Secondary | ICD-10-CM | POA: Diagnosis not present

## 2023-03-14 DIAGNOSIS — I110 Hypertensive heart disease with heart failure: Secondary | ICD-10-CM | POA: Insufficient documentation

## 2023-03-14 DIAGNOSIS — K5730 Diverticulosis of large intestine without perforation or abscess without bleeding: Secondary | ICD-10-CM | POA: Diagnosis not present

## 2023-03-14 DIAGNOSIS — J449 Chronic obstructive pulmonary disease, unspecified: Secondary | ICD-10-CM | POA: Insufficient documentation

## 2023-03-14 DIAGNOSIS — Z1211 Encounter for screening for malignant neoplasm of colon: Secondary | ICD-10-CM | POA: Insufficient documentation

## 2023-03-14 DIAGNOSIS — Z79899 Other long term (current) drug therapy: Secondary | ICD-10-CM | POA: Insufficient documentation

## 2023-03-14 DIAGNOSIS — K2100 Gastro-esophageal reflux disease with esophagitis, without bleeding: Secondary | ICD-10-CM | POA: Insufficient documentation

## 2023-03-14 DIAGNOSIS — I2510 Atherosclerotic heart disease of native coronary artery without angina pectoris: Secondary | ICD-10-CM | POA: Insufficient documentation

## 2023-03-14 DIAGNOSIS — Z7901 Long term (current) use of anticoagulants: Secondary | ICD-10-CM | POA: Diagnosis not present

## 2023-03-14 DIAGNOSIS — Z7984 Long term (current) use of oral hypoglycemic drugs: Secondary | ICD-10-CM | POA: Diagnosis not present

## 2023-03-14 DIAGNOSIS — E1151 Type 2 diabetes mellitus with diabetic peripheral angiopathy without gangrene: Secondary | ICD-10-CM | POA: Insufficient documentation

## 2023-03-14 DIAGNOSIS — Z7982 Long term (current) use of aspirin: Secondary | ICD-10-CM | POA: Insufficient documentation

## 2023-03-14 DIAGNOSIS — K635 Polyp of colon: Secondary | ICD-10-CM

## 2023-03-14 DIAGNOSIS — E119 Type 2 diabetes mellitus without complications: Secondary | ICD-10-CM | POA: Insufficient documentation

## 2023-03-14 DIAGNOSIS — Z87891 Personal history of nicotine dependence: Secondary | ICD-10-CM | POA: Insufficient documentation

## 2023-03-14 DIAGNOSIS — K644 Residual hemorrhoidal skin tags: Secondary | ICD-10-CM | POA: Insufficient documentation

## 2023-03-14 DIAGNOSIS — Z860101 Personal history of adenomatous and serrated colon polyps: Secondary | ICD-10-CM | POA: Insufficient documentation

## 2023-03-14 DIAGNOSIS — K3189 Other diseases of stomach and duodenum: Secondary | ICD-10-CM | POA: Diagnosis not present

## 2023-03-14 DIAGNOSIS — I509 Heart failure, unspecified: Secondary | ICD-10-CM | POA: Insufficient documentation

## 2023-03-14 DIAGNOSIS — K222 Esophageal obstruction: Secondary | ICD-10-CM | POA: Insufficient documentation

## 2023-03-14 DIAGNOSIS — K648 Other hemorrhoids: Secondary | ICD-10-CM | POA: Insufficient documentation

## 2023-03-14 DIAGNOSIS — Z860100 Personal history of colon polyps, unspecified: Secondary | ICD-10-CM

## 2023-03-14 DIAGNOSIS — K219 Gastro-esophageal reflux disease without esophagitis: Secondary | ICD-10-CM | POA: Insufficient documentation

## 2023-03-14 DIAGNOSIS — K2950 Unspecified chronic gastritis without bleeding: Secondary | ICD-10-CM | POA: Diagnosis not present

## 2023-03-14 DIAGNOSIS — R0609 Other forms of dyspnea: Secondary | ICD-10-CM | POA: Insufficient documentation

## 2023-03-14 DIAGNOSIS — I4891 Unspecified atrial fibrillation: Secondary | ICD-10-CM | POA: Insufficient documentation

## 2023-03-14 DIAGNOSIS — K279 Peptic ulcer, site unspecified, unspecified as acute or chronic, without hemorrhage or perforation: Secondary | ICD-10-CM | POA: Diagnosis not present

## 2023-03-14 DIAGNOSIS — K449 Diaphragmatic hernia without obstruction or gangrene: Secondary | ICD-10-CM | POA: Insufficient documentation

## 2023-03-14 DIAGNOSIS — D125 Benign neoplasm of sigmoid colon: Secondary | ICD-10-CM | POA: Diagnosis not present

## 2023-03-14 DIAGNOSIS — E785 Hyperlipidemia, unspecified: Secondary | ICD-10-CM | POA: Insufficient documentation

## 2023-03-14 DIAGNOSIS — I252 Old myocardial infarction: Secondary | ICD-10-CM | POA: Insufficient documentation

## 2023-03-14 DIAGNOSIS — K921 Melena: Secondary | ICD-10-CM | POA: Insufficient documentation

## 2023-03-14 DIAGNOSIS — Z833 Family history of diabetes mellitus: Secondary | ICD-10-CM | POA: Insufficient documentation

## 2023-03-14 DIAGNOSIS — D649 Anemia, unspecified: Secondary | ICD-10-CM | POA: Insufficient documentation

## 2023-03-14 DIAGNOSIS — K5900 Constipation, unspecified: Secondary | ICD-10-CM | POA: Insufficient documentation

## 2023-03-14 DIAGNOSIS — Z951 Presence of aortocoronary bypass graft: Secondary | ICD-10-CM | POA: Insufficient documentation

## 2023-03-14 HISTORY — PX: BIOPSY: SHX5522

## 2023-03-14 HISTORY — PX: POLYPECTOMY: SHX149

## 2023-03-14 HISTORY — PX: COLONOSCOPY WITH PROPOFOL: SHX5780

## 2023-03-14 HISTORY — PX: BALLOON DILATION: SHX5330

## 2023-03-14 HISTORY — PX: ESOPHAGOGASTRODUODENOSCOPY (EGD) WITH PROPOFOL: SHX5813

## 2023-03-14 LAB — HM COLONOSCOPY

## 2023-03-14 LAB — GLUCOSE, CAPILLARY: Glucose-Capillary: 108 mg/dL — ABNORMAL HIGH (ref 70–99)

## 2023-03-14 SURGERY — ESOPHAGOGASTRODUODENOSCOPY (EGD) WITH PROPOFOL
Anesthesia: General

## 2023-03-14 MED ORDER — POLYETHYLENE GLYCOL 3350 17 GM/SCOOP PO POWD: 8.5000 g | Freq: Every day | ORAL | Status: AC

## 2023-03-14 MED ORDER — PROPOFOL 500 MG/50ML IV EMUL
INTRAVENOUS | Status: DC | PRN
  Administered 2023-03-14: 100 ug/kg/min via INTRAVENOUS

## 2023-03-14 MED ORDER — PANTOPRAZOLE SODIUM 40 MG PO TBEC: 40.0000 mg | DELAYED_RELEASE_TABLET | Freq: Every day | ORAL | Status: AC

## 2023-03-14 MED ORDER — LIDOCAINE HCL (CARDIAC) PF 100 MG/5ML IV SOSY
PREFILLED_SYRINGE | INTRAVENOUS | Status: DC | PRN
  Administered 2023-03-14: 100 mg via INTRATRACHEAL

## 2023-03-14 MED ORDER — PROPOFOL 10 MG/ML IV BOLUS
INTRAVENOUS | Status: DC | PRN
  Administered 2023-03-14: 50 mg via INTRAVENOUS

## 2023-03-14 MED ORDER — LACTATED RINGERS IV SOLN: INTRAVENOUS | Status: DC

## 2023-03-14 NOTE — Interval H&P Note (Signed)
History and Physical Interval Note:  03/14/2023 10:43 AM  [name]  has presented today for surgery, with the diagnosis of dysphagia, hematochezia.  The various methods of treatment have been discussed with the patient and family. After consideration of risks, benefits and other options for treatment, the patient has consented to  Procedure(s) with comments: ESOPHAGOGASTRODUODENOSCOPY (EGD) WITH PROPOFOL (N/A) - 12:45pm;asa 3 COLONOSCOPY WITH PROPOFOL (N/A) - 12:45pm;asa 3 as a surgical intervention.  The patient's history has been reviewed, patient examined, no change in status, stable for surgery.  I have reviewed the patient's chart and labs.  Questions were answered to the patient's satisfaction.     Troy [name] [place] Clowers

## 2023-03-14 NOTE — Op Note (Signed)
[facility] Patient Name: Troy [name] Procedure Date: 03/14/2023 10:39 AM MRN: [id number] Date of Birth: [birth year] Attending MD: Sanjuan Dame , MD, [id number] CSN: [id number] Age: 82 Admit Type: Outpatient Procedure:                Upper GI endoscopy Indications:              Dysphagia Providers:                Sanjuan Dame, MD, Crystal Page, Kristine L. Jessee Avers, Technician Referring MD:             Sanjuan Dame, MD Medicines:                Monitored Anesthesia Care Complications:            No immediate complications. Estimated Blood Loss:     Estimated blood loss was minimal. Procedure:                Pre-Anesthesia Assessment:                           - Prior to the procedure, a History and Physical                            was performed, and patient medications and                            allergies were reviewed. The patient's tolerance of                            previous anesthesia was also reviewed. The risks                            and benefits of the procedure and the sedation                            options and risks were discussed with the patient.                            All questions were answered, and informed consent                            was obtained. Prior Anticoagulants: The patient has                            taken Coumadin (warfarin), last dose was 5 days                            prior to procedure. After reviewing the risks and                            benefits, the patient was deemed in satisfactory  condition to undergo the procedure.                           After obtaining informed consent, the endoscope was                            passed under direct vision. Throughout the                            procedure, the patient's blood pressure, pulse, and                            oxygen saturations were monitored continuously. The                             [id number]-[id number] ([id number]) scope was introduced through the                            mouth, and advanced to the second part of duodenum.                            The upper GI endoscopy was accomplished without                            difficulty. The patient tolerated the procedure                            well. Scope In: 12:00:17 PM Scope Out: 12:05:37 PM Total Procedure Duration: 0 hours 5 minutes 20 seconds  Findings:      One benign-appearing, intrinsic mild stenosis was found at the       gastroesophageal junction. The stenosis was traversed. A TTS dilator was       passed through the scope. Dilation with a 12-13.5-15 mm balloon dilator       was performed to 15 mm. The dilation site was examined following       endoscope reinsertion and showed mild mucosal disruption.      Mildly erythematous mucosa without bleeding was found in the gastric       antrum. Biopsies were taken with a cold forceps for histology.      The duodenal bulb and second portion of the duodenum were normal. Impression:               - Benign-appearing esophageal stenosis. Dilated.                           - Erythematous mucosa in the antrum. Biopsied.                           - Normal duodenal bulb and second portion of the                            duodenum. Moderate Sedation:      Per Anesthesia Care Recommendation:           - Patient has a contact number available for  emergencies. The signs and symptoms of potential                            delayed complications were discussed with the                            patient. Return to normal activities tomorrow.                            Written discharge instructions were provided to the                            patient.                           - Resume previous diet.                           - Continue present medications.                           - Await pathology results.                           - Repeat upper  endoscopy PRN for retreatment.                           -PPI daily Procedure Code(s):        --- Professional ---                           [phone], Esophagogastroduodenoscopy, flexible,                            transoral; with transendoscopic balloon dilation of                            esophagus (less than 30 mm diameter)                           43239, 59, Esophagogastroduodenoscopy, flexible,                            transoral; with biopsy, single or multiple Diagnosis Code(s):        --- Professional ---                           K22.2, Esophageal obstruction                           K31.89, Other diseases of stomach and duodenum                           R13.10, Dysphagia, unspecified CPT copyright 2022 American Medical Association. All rights reserved. The codes documented in this report are preliminary and upon coder review may  be revised to meet current compliance requirements. Sanjuan Dame, MD Sanjuan Dame, MD 03/14/2023 12:36:39 PM This  report has been signed electronically. Number of Addenda: 0

## 2023-03-14 NOTE — Op Note (Signed)
[facility] Patient Name: Troy [name] Procedure Date: 03/14/2023 12:08 PM MRN: [id number] Date of Birth: 1940/12/18 Attending MD: Sanjuan Dame , MD, [id number] CSN: [id number] Age: 82 Admit Type: Outpatient Procedure:                Colonoscopy Indications:              High risk colon cancer surveillance: Personal                            history of colonic polyps Providers:                Sanjuan Dame, MD, Crystal Page, Kristine L. Jessee Avers, Technician Referring MD:             Sanjuan Dame, MD Medicines:                Monitored Anesthesia Care Complications:            No immediate complications. Estimated Blood Loss:     Estimated blood loss: none. Procedure:                Pre-Anesthesia Assessment:                           - Prior to the procedure, a History and Physical                            was performed, and patient medications and                            allergies were reviewed. The patient's tolerance of                            previous anesthesia was also reviewed. The risks                            and benefits of the procedure and the sedation                            options and risks were discussed with the patient.                            All questions were answered, and informed consent                            was obtained. Prior Anticoagulants: The patient has                            taken Coumadin (warfarin), last dose was 5 days                            prior to procedure. After reviewing the risks and  benefits, the patient was deemed in satisfactory                            condition to undergo the procedure.                           After obtaining informed consent, the colonoscope                            was passed under direct vision. Throughout the                            procedure, the patient's blood pressure, pulse, and                             oxygen saturations were monitored continuously. The                            [id number]) scope was introduced through the                            anus and advanced to the the terminal ileum. The                            colonoscopy was performed without difficulty. The                            patient tolerated the procedure well. The quality                            of the bowel preparation was evaluated using the                            BBPS [facility] Bowel Preparation Scale) with scores                            of: Right Colon = 3, Transverse Colon = 3 and Left                            Colon = 3 (entire mucosa seen well with no residual                            staining, small fragments of stool or opaque                            liquid). The total BBPS score equals 9. The                            terminal ileum, ileocecal valve, appendiceal                            orifice, and rectum were photographed. Scope In: 12:11:23 PM Scope Out: 12:33:50 PM Scope Withdrawal Time: 0 hours 16  minutes 12 seconds  Total Procedure Duration: 0 hours 22 minutes 27 seconds  Findings:      Hemorrhoids were found on perianal exam.      Two sessile polyps were found in the sigmoid colon. The polyps were 4 to       7 mm in size. These polyps were removed with a cold snare. Resection and       retrieval were complete.      Scattered diverticula were found in the entire colon.      Non-bleeding external and internal hemorrhoids were found during       retroflexion. The hemorrhoids were small. Impression:               - Hemorrhoids found on perianal exam.                           - Two 4 to 7 mm polyps in the sigmoid colon,                            removed with a cold snare. Resected and retrieved.                           - Diverticulosis in the entire examined colon.                           - Non-bleeding external and internal hemorrhoids. Moderate Sedation:      Per  Anesthesia Care Recommendation:           - Patient has a contact number available for                            emergencies. The signs and symptoms of potential                            delayed complications were discussed with the                            patient. Return to normal activities tomorrow.                            Written discharge instructions were provided to the                            patient.                           - Resume previous diet.                           - Continue present medications.                           - Await pathology results.                           - No repeat colonoscopy due to current age (30  years or older).                           -No contrainidcation to restart clinically                            inidicate anticoagulation as per GI perspective ;                            as per cardiology no bridging indicated                           -High fiber diet Procedure Code(s):        --- Professional ---                           [phone], Colonoscopy, flexible; with removal of                            tumor(s), polyp(s), or other lesion(s) by snare                            technique Diagnosis Code(s):        --- Professional ---                           Z86.010, Personal history of colonic polyps                           D12.5, Benign neoplasm of sigmoid colon                           K64.8, Other hemorrhoids                           K57.30, Diverticulosis of large intestine without                            perforation or abscess without bleeding CPT copyright 2022 American Medical Association. All rights reserved. The codes documented in this report are preliminary and upon coder review may  be revised to meet current compliance requirements. Sanjuan Dame, MD Sanjuan Dame, MD 03/14/2023 12:48:43 PM This report has been signed electronically. Number of Addenda: 0

## 2023-03-14 NOTE — Transfer of Care (Signed)
Immediate Anesthesia Transfer of Care Note  Patient: Troy [name]  Procedure(s) Performed: ESOPHAGOGASTRODUODENOSCOPY (EGD) WITH PROPOFOL COLONOSCOPY WITH PROPOFOL BIOPSY BALLOON DILATION POLYPECTOMY INTESTINAL  Patient Location: PACU  Anesthesia Type:General  Level of Consciousness: awake, alert , and oriented  Airway & Oxygen Therapy: Patient Spontanous Breathing and Patient connected to nasal cannula oxygen  Post-op Assessment: Report given to RN, Post -op Vital signs reviewed and stable, and Patient moving all extremities X 4  Post vital signs: Reviewed and stable  Last Vitals:  Vitals Value Taken Time  BP 103/47 03/14/23 1240  Temp 36.6 C 03/14/23 1240  Pulse 61 03/14/23 1240  Resp 14 03/14/23 1240  SpO2 100 % 03/14/23 1240    Last Pain:  Vitals:   03/14/23 1240  TempSrc: Oral  PainSc: 0-No pain         Complications: No notable events documented.

## 2023-03-14 NOTE — Anesthesia Preprocedure Evaluation (Signed)
Anesthesia Evaluation  Patient identified by MRN, date of birth, ID band Patient awake    Reviewed: Allergy & Precautions, H&P , NPO status , Patient's Chart, lab work & pertinent test results, reviewed documented beta blocker date and time   Airway Mallampati: II  TM Distance: >3 FB Neck ROM: full    Dental no notable dental hx.    Pulmonary pneumonia, COPD, former smoker   Pulmonary exam normal breath sounds clear to auscultation       Cardiovascular Exercise Tolerance: Good hypertension, pulmonary hypertension+ CAD, + Past MI, + Peripheral Vascular Disease, +CHF and + DOE  + Valvular Problems/Murmurs  Rhythm:regular Rate:Normal     Neuro/Psych negative neurological ROS  negative psych ROS   GI/Hepatic Neg liver ROS, PUD,GERD  ,,  Endo/Other  diabetes    Renal/GU negative Renal ROS  negative genitourinary   Musculoskeletal   Abdominal   Peds  Hematology  (+) Blood dyscrasia, anemia   Anesthesia Other Findings   Reproductive/Obstetrics negative OB ROS                             Anesthesia Physical Anesthesia Plan  ASA: 3  Anesthesia Plan: General   Post-op Pain Management:    Induction:   PONV Risk Score and Plan: Propofol infusion  Airway Management Planned:   Additional Equipment:   Intra-op Plan:   Post-operative Plan:   Informed Consent: I have reviewed the patients History and Physical, chart, labs and discussed the procedure including the risks, benefits and alternatives for the proposed anesthesia with the patient or authorized representative who has indicated his/her understanding and acceptance.     Dental Advisory Given  Plan Discussed with: CRNA  Anesthesia Plan Comments:        Anesthesia Quick Evaluation

## 2023-03-14 NOTE — Discharge Instructions (Signed)

## 2023-03-15 LAB — SURGICAL PATHOLOGY

## 2023-03-17 NOTE — Anesthesia Postprocedure Evaluation (Signed)
Anesthesia Post Note  Patient: SHOLOM BULA  Procedure(s) Performed: ESOPHAGOGASTRODUODENOSCOPY (EGD) WITH PROPOFOL COLONOSCOPY WITH PROPOFOL BIOPSY BALLOON DILATION POLYPECTOMY INTESTINAL  Patient location during evaluation: Phase II Anesthesia Type: General Level of consciousness: awake Pain management: pain level controlled Vital Signs Assessment: post-procedure vital signs reviewed and stable Respiratory status: spontaneous breathing and respiratory function stable Cardiovascular status: blood pressure returned to baseline and stable Postop Assessment: no headache and no apparent nausea or vomiting Anesthetic complications: no Comments: Late entry   No notable events documented.   Last Vitals:  Vitals:   03/14/23 1051 03/14/23 1240  BP: (!) 195/76 (!) 103/47  Pulse: 62 61  Resp: 14 14  Temp: 36.6 C 36.6 C  SpO2:  100%    Last Pain:  Vitals:   03/15/23 1026  TempSrc:   PainSc: 0-No pain                 Windell Norfolk

## 2023-03-18 NOTE — Progress Notes (Signed)
I reviewed the pathology results. Ann, can you send her a letter with the findings as described below please?  Thanks,  Vista Lawman, MD Gastroenterology and Hepatology [facility]  ---------------------------------------------------------------------------------------------  [facility] [address], [address], [place], [place] [address] Phone:  [phone]   03/18/23 [name], [place]   Dear Troy [name],  I am writing to inform you that the biopsies taken during your recent endoscopic examination showed:  No H. Pylori bacteria in stomach , or any early cancer changes to the stomach mucosa ( Intestinal metaplasia)   I am writing to let you know the results of your recent colonoscopy.  You had a total of 2 polyps removed. The pathology came back as "tubular adenoma and hyperplastic polyp ." These findings are NOT cancer, but had the polyp (Tubular adenoma)  remained in your colon, they could have turned into cancer.  Also I value your feedback , so if you get a survey , please take the time to fill it out and thank you for choosing [facility]  Please call us at [phone] if you have persistent problems or have questions about your condition that have not been fully answered at this time.  Sincerely,  Vista Lawman, MD Gastroenterology and Hepatology

## 2023-03-19 ENCOUNTER — Encounter (INDEPENDENT_AMBULATORY_CARE_PROVIDER_SITE_OTHER): Payer: Self-pay | Admitting: *Deleted

## 2023-03-21 ENCOUNTER — Encounter (HOSPITAL_COMMUNITY): Payer: Self-pay | Admitting: Gastroenterology

## 2023-03-25 NOTE — Progress Notes (Signed)
Spoke with pt and notified of results per Dr. Wert. Pt verbalized understanding and denied any questions. 

## 2023-04-08 DIAGNOSIS — E782 Mixed hyperlipidemia: Secondary | ICD-10-CM | POA: Diagnosis not present

## 2023-04-08 DIAGNOSIS — I10 Essential (primary) hypertension: Secondary | ICD-10-CM | POA: Diagnosis not present

## 2023-04-08 DIAGNOSIS — G894 Chronic pain syndrome: Secondary | ICD-10-CM | POA: Diagnosis not present

## 2023-04-08 DIAGNOSIS — Z0001 Encounter for general adult medical examination with abnormal findings: Secondary | ICD-10-CM | POA: Diagnosis not present

## 2023-04-08 DIAGNOSIS — E1151 Type 2 diabetes mellitus with diabetic peripheral angiopathy without gangrene: Secondary | ICD-10-CM | POA: Diagnosis not present

## 2023-04-08 DIAGNOSIS — Z6831 Body mass index (BMI) 31.0-31.9, adult: Secondary | ICD-10-CM | POA: Diagnosis not present

## 2023-04-08 DIAGNOSIS — Z1331 Encounter for screening for depression: Secondary | ICD-10-CM | POA: Diagnosis not present

## 2023-04-08 DIAGNOSIS — Z7901 Long term (current) use of anticoagulants: Secondary | ICD-10-CM | POA: Diagnosis not present

## 2023-04-08 DIAGNOSIS — E6609 Other obesity due to excess calories: Secondary | ICD-10-CM | POA: Diagnosis not present

## 2023-04-11 ENCOUNTER — Encounter (HOSPITAL_COMMUNITY): Payer: Self-pay

## 2023-04-11 ENCOUNTER — Ambulatory Visit (HOSPITAL_COMMUNITY)
Admission: RE | Admit: 2023-04-11 | Discharge: 2023-04-11 | Disposition: A | Discharge status: Home / Self Care | Payer: No Typology Code available for payment source | Source: Ambulatory Visit | Attending: Cardiology | Admitting: Cardiology

## 2023-04-11 VITALS — BP 168/66 | HR 73 | Wt 207.4 lb

## 2023-04-11 DIAGNOSIS — I110 Hypertensive heart disease with heart failure: Secondary | ICD-10-CM | POA: Insufficient documentation

## 2023-04-11 DIAGNOSIS — J9611 Chronic respiratory failure with hypoxia: Secondary | ICD-10-CM

## 2023-04-11 DIAGNOSIS — I739 Peripheral vascular disease, unspecified: Secondary | ICD-10-CM | POA: Diagnosis not present

## 2023-04-11 DIAGNOSIS — I4821 Permanent atrial fibrillation: Secondary | ICD-10-CM

## 2023-04-11 DIAGNOSIS — I714 Abdominal aortic aneurysm, without rupture, unspecified: Secondary | ICD-10-CM | POA: Insufficient documentation

## 2023-04-11 DIAGNOSIS — N401 Enlarged prostate with lower urinary tract symptoms: Secondary | ICD-10-CM | POA: Diagnosis not present

## 2023-04-11 DIAGNOSIS — R001 Bradycardia, unspecified: Secondary | ICD-10-CM

## 2023-04-11 DIAGNOSIS — I272 Pulmonary hypertension, unspecified: Secondary | ICD-10-CM

## 2023-04-11 DIAGNOSIS — I5032 Chronic diastolic (congestive) heart failure: Secondary | ICD-10-CM

## 2023-04-11 DIAGNOSIS — D509 Iron deficiency anemia, unspecified: Secondary | ICD-10-CM | POA: Diagnosis not present

## 2023-04-11 DIAGNOSIS — R590 Localized enlarged lymph nodes: Secondary | ICD-10-CM | POA: Diagnosis not present

## 2023-04-11 DIAGNOSIS — J3801 Paralysis of vocal cords and larynx, unilateral: Secondary | ICD-10-CM | POA: Diagnosis not present

## 2023-04-11 DIAGNOSIS — R32 Unspecified urinary incontinence: Secondary | ICD-10-CM

## 2023-04-11 DIAGNOSIS — I493 Ventricular premature depolarization: Secondary | ICD-10-CM | POA: Insufficient documentation

## 2023-04-11 DIAGNOSIS — I25119 Atherosclerotic heart disease of native coronary artery with unspecified angina pectoris: Secondary | ICD-10-CM | POA: Insufficient documentation

## 2023-04-11 DIAGNOSIS — I2510 Atherosclerotic heart disease of native coronary artery without angina pectoris: Secondary | ICD-10-CM | POA: Diagnosis not present

## 2023-04-11 DIAGNOSIS — I6529 Occlusion and stenosis of unspecified carotid artery: Secondary | ICD-10-CM | POA: Diagnosis not present

## 2023-04-11 DIAGNOSIS — I25810 Atherosclerosis of coronary artery bypass graft(s) without angina pectoris: Secondary | ICD-10-CM | POA: Diagnosis not present

## 2023-04-11 DIAGNOSIS — Z87891 Personal history of nicotine dependence: Secondary | ICD-10-CM | POA: Insufficient documentation

## 2023-04-11 LAB — BASIC METABOLIC PANEL
Anion gap: 8 (ref 5–15)
BUN: 20 mg/dL (ref 8–23)
CO2: 27 mmol/L (ref 22–32)
Calcium: 10.2 mg/dL (ref 8.9–10.3)
Chloride: 102 mmol/L (ref 98–111)
Creatinine, Ser: 1.19 mg/dL (ref 0.61–1.24)
GFR, Estimated: >60 mL/min (ref >60)
Glucose, Bld: 117 mg/dL — ABNORMAL HIGH (ref 70–99)
Potassium: 4.9 mmol/L (ref 3.5–5.1)
Sodium: 137 mmol/L (ref 135–145)

## 2023-04-11 LAB — BRAIN NATRIURETIC PEPTIDE: B Natriuretic Peptide: 147.7 pg/mL — ABNORMAL HIGH (ref 0.0–100.0)

## 2023-04-11 MED ORDER — LOSARTAN POTASSIUM 50 MG PO TABS: 50.0000 mg | ORAL_TABLET | Freq: Every day | ORAL | 3 refills | Status: DC

## 2023-04-11 NOTE — Addendum Note (Signed)
 Encounter addended by: Elle Vezina, [place], NP on: 04/11/2023 3:05 PM  Actions taken: Clinical Note Signed

## 2023-04-11 NOTE — Patient Instructions (Signed)
 Medication Changes:  INCREASE LOSARTAN  TO 50MG  ONCE DAILY   Lab Work:  Labs done today, your results will be available in MyChart, we will contact you for abnormal readings.  PLEASE GO TO [facility] IN [place] FOR LABS IN 7-10 DAYS   PLEASE REFER TO [facility] FOR BLOOD WORK TESTING: LOCATIONS LISTED BELOW   Address: [address] [place], [place] [address] Hours:  Open ? Closes 12:30?PM ? Reopens 1:30?PM Updated by this business 7 weeks ago Phone: [phone] Appointments: [email or web address]  Address: [address] [place], [place] [address] Hours:  Open ? Closes 12?PM ? Reopens 1?PM Updated by this business 7 weeks ago Phone: [phone] Appointments: [email or web address]  Follow-Up in: IN 3-4 WEEKS AS SCHEDULED WITH PHARMACIST   THEN AGAIN IN 4 MONTHS WITH DR. ROLAN PLEASE CALL OUR OFFICE AROUND MARCH/APRIL TO GET SCHEDULED FOR YOUR APPOINTMENT. PHONE NUMBER IS [phone] OPTION 2   At the [facility], you and your health needs are our priority. We have a designated team specialized in the treatment of Heart Failure. This Care Team includes your primary Heart Failure Specialized Cardiologist (physician), Advanced Practice Providers (APPs- Physician Assistants and Nurse Practitioners), and Pharmacist who all work together to provide you with the care you need, when you need it.   You may see any of the following providers on your designated Care Team at your next follow up:  Dr. Toribio Fuel Dr. Ezra Rolan Dr. Ria Commander Dr. Odis Brownie Greig Mosses, NP Caffie Shed, [place] [facility] [place], [place] Beckey Coe, NP Jordan Lee, NP Tinnie Redman, PharmD   Please be sure to bring in all your medications bottles to every appointment.   Need to Contact Us :  If you have any questions or concerns before your next appointment please send us  a message through [place] or call our office at [phone].    TO LEAVE A MESSAGE FOR THE NURSE  SELECT OPTION 2, PLEASE LEAVE A MESSAGE INCLUDING: YOUR NAME DATE OF BIRTH CALL BACK NUMBER REASON FOR CALL**this is important as we prioritize the call backs  YOU WILL RECEIVE A CALL BACK THE SAME DAY AS LONG AS YOU CALL BEFORE 4:00 PM

## 2023-04-11 NOTE — Progress Notes (Addendum)
 ADVANCED HF CLINIC NOTE  Primary Care: Troy Bernardino LABOR, PA-C Primary Cardiologist: Troy Rout, MD (Inactive) HF Cardiology: Dr. Rolan  Chief Complaint:  HPI:  Troy [name] is a 83 y.o. male patient with history of permanent atrial fibrillation, CAD s/p CABG 2000, PAD, chronic diastolic CHF was referred to CHF clinic by Troy Bring, PA, for evaluation of CHF and pulmonary hypertension.  Patient had CABG in 2000, last cath was in 2014 showing occluded SVG-OM, occluded SVG-PDA, occluded RCA, and atretic LIMA.  SVG-D was patent and native LAD was patent.  Patient had bilateral fem-pop bypasses, peripheral dopplers in 7/21 showed that the right fem-pop was occluded.  He is in permanent atrial fibrillation on warfarin. Echo in 10/21 showed normal LV systolic function with moderately dilated/mildly dysfunctional RV and PASP 75 mmHg.  In 12/21, he was admitted with PNA and treated with antibiotics.  CTA chest showed no PE, RML PNA, and bilateral hilar + mediastinal lymphadenopathy.  He was sent home from this admission on home oxygen .   In 1/22, RHC/LHC was done.  This showed patent LIMA-LAD and SVG-D, occluded SVG-OM and occluded SVG-RCA, 95% calcified proximal RCA stenosis, totally occluded PLV with collaterals (known from prior).  There was moderate pulmonary hypertension with optimized left and right heart filling pressures.  I reviewed the cath with interventional cardiology, would require atherectomy to treat the RCA.  We decided to manage medically initially.   He stopped Jardiance  due to penile yeast infection.   Echo 4/23 showed EF 55-60%, mild LVH, RV mildly reduced.   Admitted 10/23 with hematuria and inability to urinate. Urology consulted, foley placed and discharged home. Seen in the ED a week later with hematuria. Urology consulted and arranged traction on foley to help assist with tamponade.   Echo 3/24 showed EF 60-65% with mildly D-shaped septum, mild RV dilation with mildly  decreased systolic function, IVC dilated, PASP 45 mmHg.   Seen in ED 07/10/22 with CP. HR 30 when EMS arrived, asymptomatic. He was given IV atropine and 200 cc IVF bolus. HsTroponins x 3 negative, HR 50's. Live Zio 2 week placed 4/24.  Zio (5/24) showed continuous atrial fibrillation, average HR 60 bpm. Rare PVCs.  6/24 underwent EVAR for AAA with repair of right common femoral artery. Discharged to SNF.  Discharged to home from [facility] 10/10/22.   Today he returns for HF follow up. Overall feeling fine. Has been walking with walker. Denies increasing SOB, CP, dizziness, edema, or PND/Orthopnea. Appetite has been good, endorses eating a lot recently with the holidays, which he says is the cause of his 5lb weight gain. Complains of GU symptoms and incontinence. He is scheduled for Monday with Urology for re-evaluation. BP 130/60s at home, although elevated in clinic to 180s and recheck 160s. No fever or chills. No bleeding issues. Taking all medications.   Labs (12/21): K 3.8, creatinine 0.83, hgb 9.5 Labs (1/22): K 3.8, creatinine 1.22, LDL 49 Labs (2/22): K 3.6, creatinine 0.86 Labs (10/22): K 3.7, creatinine 0.75, BNP 153 Labs (1/23): LDL 62, HDL 37 Labs (9/23): K 3.6, creatinine 0.9, hgb 11.4 Labs (10/23): K 3.5, creatinine 0.81 Labs (2/24): K 3.8, creatinine 1.02 Labs (3/24): K 3.6, creatinine 1.07 Labs (7/24): K 5.1, creatinine 2, Iron sat 9%  Labs (8/24): K 4.3, creatinine 1.19, BNP 223  PMH: 1. Atrial fibrillation: Permanent.  2. CAD: CABG 2000.  - LHC (2014) with totally occluded RCA, totally occluded SVG-RCA, totally occluded SVG-OM, atretic LIMA, patent SVG-D.   -  LHC (1/22): patent LIMA-LAD and SVG-D, occluded SVG-OM and occluded SVG-RCA, 60-70% calcified ostial RCA stenosis and 95% calcified proximal RCA stenosis, totally occluded PLV with collaterals (known from prior). 3. HTN 4. PAD: s/p bilateral fem-pop bypasses.   - Peripheral arterial dopplers (7/21): Occluded right fem-pop,  patent left fem-pop.  5. Type 2 diabetes 6. Prostate cancer: Treated with radiation. 7. PVCs 8. History of renal artery stenosis.  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  10. Chronic diastolic CHF: Echo (10/21) with EF 60-65%, mildly decreased RV systolic function with moderate RV enlargement, severe biatrial enlargement, PASP 75 mmHg, dilated IVC.  V/Q scan 7/22 with no chronic PE.  - RHC (1/22): mean RA 6, PA 65/18 mean 35, mean PCWP 14, CI 2.71, PVR 3.7 WU.  - Echo (4/23): EF 55-60%, mild LVH, RV mildly reduced, severely elevated pulmonary artery systolic pressure 11. 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  - Abdominal US  (8/22): 4.8 cm AAA - CT abd/pelvis (5/23): 4.8 cm AAA - CT abd/pelvis (2/24): 5 cm AAA - EVAR for AAA in 6/24 12. Carotid stenosis: Carotid dopplers (8/22) with 40-59% LICA stenosis.  - Carotid dopplers (9/23): 40-59% LICA stenosis.  13. 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  Retired.  Remote smoker (>20 years ago). No ETOH.   Family History  Problem Relation Age of Onset   Deep vein thrombosis Father    Lung cancer Sister    Diabetes Sister    Heart disease Sister        After age 87   Hyperlipidemia Sister    Hypertension Sister    Lung cancer Sister    Breast cancer Sister    Hypertension Mother    Diabetes Sister    Coronary artery disease Other        family hx of   Cancer Brother        crab cancer   Heart disease Brother    Heart attack Brother    Heart attack Daughter    Colon cancer Neg Hx    Stroke Neg Hx    ROS: All systems reviewed and negative except as per HPI.   Current Outpatient Medications  Medication Sig Dispense Refill   acetaminophen  (TYLENOL ) 325 MG tablet Take 2 tablets (650 mg total) by mouth every 6 (six) hours as needed for mild pain, fever or headache (or Fever >/= 101). 12 tablet 0   albuterol  (VENTOLIN  HFA) 108 (90 Base) MCG/ACT inhaler Inhale 2 puffs into the lungs every 6 (six)  hours as needed for wheezing or shortness of breath. 1 each 2   aspirin  81 MG EC tablet Take 1 tablet (81 mg total) by mouth daily with breakfast. 30 tablet 3   atorvastatin  (LIPITOR) 40 MG tablet Take 1 tablet (40 mg total) by mouth daily. 90 tablet 1   Cholecalciferol (VITAMIN D3) 50 MCG (2000 UT) capsule Take 2,000 Units by mouth daily.     dutasteride  (AVODART ) 0.5 MG capsule Take 1 capsule (0.5 mg total) by mouth daily. 30 capsule 11   HYDROcodone -acetaminophen  (NORCO) 10-325 MG tablet Take 1 tablet by mouth 2 (two) times daily as needed.     Incontinence Supplies (BARD [place] INCONTIN CLAMP) MISC 1 Units by Does not apply route daily. 1 each 0   ipratropium (ATROVENT ) 0.03 % nasal spray Place 2 sprays into both nostrils 2 (two) times daily as needed for rhinitis.     isosorbide  mononitrate (IMDUR ) 60 MG  24 hr tablet Take 2 tablets (120 mg total) by mouth every evening. 180 tablet 3   latanoprost  (XALATAN ) 0.005 % ophthalmic solution Place 1 drop into both eyes at bedtime.     levocetirizine (XYZAL) 5 MG tablet Take 5 mg by mouth every evening.     loratadine (CLARITIN) 10 MG tablet Take 10 mg by mouth daily.     metFORMIN  (GLUCOPHAGE ) 500 MG tablet Take 500 mg by mouth 2 (two) times daily with a meal.     nitrofurantoin  (MACRODANTIN ) 50 MG capsule Take 1 capsule (50 mg total) by mouth at bedtime. 30 capsule 5   nitroGLYCERIN  (NITROSTAT ) 0.4 MG SL tablet Place 1 tablet (0.4 mg total) under the tongue every 5 (five) minutes x 3 doses as needed for chest pain. 25 tablet 3   oxyCODONE -acetaminophen  (PERCOCET) 5-325 MG tablet Take 1 tablet by mouth every 6 (six) hours as needed for severe pain. 30 tablet 0   OXYGEN  Inhale 3 L into the lungs at bedtime.     pantoprazole  (PROTONIX ) 40 MG tablet Take 1 tablet (40 mg total) by mouth daily. Take 30-60 min before first meal of the day     polyethylene glycol powder (GLYCOLAX /MIRALAX ) 17 GM/SCOOP powder Take 8.5 g by mouth daily. (Patient taking  differently: Take 8.5 g by mouth daily. As needed)     spironolactone  (ALDACTONE ) 25 MG tablet Take 1 tablet (25 mg total) by mouth daily. 90 tablet 3   torsemide  (DEMADEX ) 10 MG tablet Take 1 tablet (10 mg total) by mouth every 3 (three) days.     triamcinolone cream (KENALOG) 0.1 % Apply 1 Application topically 2 (two) times daily.     warfarin (COUMADIN ) 2 MG tablet Take 2 mg by mouth daily.     losartan  (COZAAR ) 50 MG tablet Take 1 tablet (50 mg total) by mouth daily. 30 tablet 3   No current facility-administered medications for this encounter.   Wt Readings from Last 3 Encounters:  04/11/23 94.1 kg (207 lb 6.4 oz)  03/14/23 91.6 kg (202 lb)  03/12/23 91.6 kg (202 lb)   BP (!) 168/66   Pulse 73   Wt 94.1 kg (207 lb 6.4 oz)   SpO2 97%   BMI 29.34 kg/m   Physical Exam: General: Elderly, frail appearing. No distress on RA. Ambulated with walker HEENT: neck supple.   Cardiac: JVP not visible. S1 and S2 present. No murmurs or rub. Resp: Lung sounds clear and equal B/L Abdomen: Soft, non-tender, non-distended. + BS. Extremities: Warm and dry. No rash, cyanosis.  1+ edema in ankles Neuro: Alert and oriented x3. Affect pleasant. Moves all extremities without difficulty.  Assessment/Plan: 1. Chronic diastolic CHF: Echo in 10/21 with EF 60-65%, mildly decreased RV systolic function with moderate RV enlargement, severe biatrial enlargement, PASP 75 mmHg, dilated IVC. After increasing diuretics, RHC in 1/22 showed normal filling pressures with moderate pulmonary hypertension. Echo 4/23 with EF 60-65%, mildly enlarged RV with mildly decreased RV function. Echo 3/24 showed EF 60-65% with mildly D-shaped septum, mild RV dilation with mildly decreased systolic function, IVC dilated, PASP 45 mmHg. NYHA class II, but not very active d/t deconditioning.  Not volume overloaded on exam. Minimal ankle edema, appears to be more venous congestions, encouraged to wear compression stockings. Incontinence  makes it difficult to take torsemide .  - Continue torsemide  to 10 mg every 3 days.  BMET/BNP today.  - Continue spiro 25 mg daily. - Increase losartan  to 50 mg daily. Repeat labs in  7-10days - No SGLT2 inhibitor with incontinence, UTIs, yeast infections.   2. 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  Moderate PH on RHC in 1/22.  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  Less likely group 1 PH.  No chronic PEs on 7/22 V/Q scan. 3. Chronic hypoxemic respiratory failure: Remote smoker.  Hilar and mediastinal lymphadenopathy on chest CT, no reported ILD.  Recurrent aspiration PNA in setting of chronic right vocal cord paralysis.   - Stable on room air. Continue oxygen  at night.  - Should follow with pulmonary.  4. CAD: s/p CABG in 2000.  Cath in 1/22 showed patent LIMA-LAD and SVG-D but occluded SVG-OM and SVG-RCA.  There was 60-70% ostial RCA stenosis and 95% proximal RCA stenosis with heavy calcification.  Discussed with interventional, with normal EF plan was to proceed with initial medical management and proceed to PCI if chest pain worsened (PCI would require atherectomy). No chest pain. - Continue ASA 81 and atorvastatin , good lipids 3/24 - Continue Imdur  120 mg daily for angina control.  - Off Toprol  XL with bradycardia. 5. Atrial fibrillation: Permanent. Rate control is reasonable without nodal blocking agent.  - On warfarin. PCP manages INR. 6. PAD: Occluded right fem-pop bypass. No claudication.  - Follows with [facility]. 7. Carotid stenosis: 40-59% LICA stenosis on 9/23 dopplers, followed at [facility].  - Due for repeat carotids, has them scheduled for [facility].  8. AAA: 5 cm AAA on 2/24 abdominal CT.  EVAR 6/24.  9. Incontinence: Follows closely with urology. On tamsulosin .   10. Bradycardia: Given IV atropine with HR 30s in 4/24; he was asymptomatic.  Now off beta blocker.  Zio (5/24) showed continuous atrial fibrillation, average HR 60 bpm. Rare PVCs.  No  lightheadedness.  11. 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   Follow up with pharmD in 3-4 weeks for BP check and med titration. Followup 4 months with Dr. Rolan.   Iviana Blasingame  04/11/2023

## 2023-04-16 ENCOUNTER — Other Ambulatory Visit (HOSPITAL_COMMUNITY): Payer: Self-pay

## 2023-04-16 NOTE — Progress Notes (Signed)
Advanced Heart Failure Clinic Note   Primary Care: Kathrin Penner, PA-C Primary Cardiologist: Prentice Docker, MD (Inactive) HF Cardiology: [name]  HPI:  [name] is a 83 y.o. male patient with history of permanent atrial fibrillation, CAD s/p CABG 2000, PAD, chronic diastolic CHF was referred to CHF clinic by Ronie Spies, PA, for evaluation of CHF and pulmonary hypertension.  Patient had CABG in 2000, cath in 2014 showed occluded SVG-OM, occluded SVG-PDA, occluded RCA, and atretic LIMA.  SVG-D was patent and native LAD was patent.  Patient had bilateral fem-pop bypasses, peripheral dopplers in 10/2019 showed that the right fem-pop was occluded.  He is in permanent atrial fibrillation on warfarin. Echo in 01/2020 showed normal LV systolic function with moderately dilated/mildly dysfunctional RV and PASP 75 mmHg.  In 03/2020, he was admitted with PNA and treated with antibiotics.  CTA chest showed no PE, RML PNA, and bilateral hilar + mediastinal lymphadenopathy.  He was sent home from this admission on home oxygen.    In 04/2020, RHC/LHC was done.  This showed patent LIMA-LAD and SVG-D, occluded SVG-OM and occluded SVG-RCA, 95% calcified proximal RCA stenosis, totally occluded PLV with collaterals (known from prior).  There was moderate pulmonary hypertension with optimized left and right heart filling pressures.  Cath reviewed with interventional cardiology, would require atherectomy to treat the RCA.  Decided to manage medically initially.    He stopped Jardiance due to penile yeast infection.    Echo 07/2021 showed EF 55-60%, mild LVH, RV mildly reduced.    Admitted 12/2021 with hematuria and inability to urinate. Urology consulted, foley placed and discharged home. Seen in the ED a week later with hematuria. Urology consulted and arranged traction on foley to help assist with tamponade.    Echo 06/2022 showed EF 60-65% with mildly D-shaped septum, mild RV dilation with mildly decreased  systolic function, IVC dilated, PASP 45 mmHg.    Seen in ED 07/10/22 with CP. HR 30 when EMS arrived, asymptomatic. He was given IV atropine and 200 cc IVF bolus. HsTroponins x 3 negative, HR 50's. Live Zio 2 week placed 07/2022.  Zio (08/2022) showed continuous atrial fibrillation, average HR 60 bpm. Rare PVCs.   09/2022 underwent EVAR for AAA with repair of right common femoral artery. Discharged to SNF.  Discharged to home from [facility] 10/10/22.    Returned to [facility] for HF follow up 04/11/23. Overall was feeling fine. Had been walking with walker. Denied increasing SOB, CP, dizziness, edema, or PND/Orthopnea. Appetite had been good, endorsed eating a lot recently with the holidays, which he stated was the cause of his 5 lb weight gain. Complained of GU symptoms and incontinence. He was scheduled for follow up Monday with Urology for re-evaluation. BP 130/60s at home, although elevated in clinic to 180s and recheck 160s. No fever or chills. No bleeding issues. Reported taking all medications.     Today he returns to HF clinic for pharmacist medication titration. At last visit with [facility], losartan was increased to 50 mg daily. Overall he is feeling ok today. Main complaint is arthritis pain and pain in his testicle. He thinks this pain is contributing to his BP running higher recently. No dizziness or lightheadedness. BP at home usually 130-140s/60s. He checks his BP throughout the day. BP in clinic 150/66, then down to 136/66 after resting for 10 minutes. No CP or palpitations. No increased SOB. He uses a walker and a scooter. Weight at home still higher  than his normal, says he gained weight over the holidays. He takes torsemide 10 mg every 3rd day but will occasionally take extra if needed. No LEE, PND or orthopnea.   HF Medications: Losartan 50 mg daily Spironolactone 25 mg daily Torsemide 10 mg every 3 days  Has the patient been experiencing any side effects to the medications prescribed?   no  Does the patient have any problems obtaining medications due to transportation or finances?   no  Understanding of regimen: good Understanding of indications: good Potential of compliance: good Patient understands to avoid NSAIDs. Patient understands to avoid decongestants.    Pertinent Lab Values: 04/19/23: Serum creatinine 1.03, BUN 17, Potassium 4.8, Sodium 144, BNP 147.7  Vital Signs: Weight: 206.2 (last clinic weight: 207.4) Blood pressure: 150/66, down to 136/66 after resting 10 minutes  Heart rate: 70   Assessment/Plan: 1. Chronic diastolic CHF: Echo in 01/2020 with EF 60-65%, mildly decreased RV systolic function with moderate RV enlargement, severe biatrial enlargement, PASP 75 mmHg, dilated IVC. After increasing diuretics, RHC in 04/2020 showed normal filling pressures with moderate pulmonary hypertension. Echo 07/2021 with EF 60-65%, mildly enlarged RV with mildly decreased RV function. Echo 06/2022 showed EF 60-65% with mildly D-shaped septum, mild RV dilation with mildly decreased systolic function, IVC dilated, PASP 45 mmHg.  - NYHA class II, but not very active d/t deconditioning.  Not volume overloaded on exam. Incontinence makes it difficult to take torsemide.  - Continue torsemide to 10 mg every 3 days.    - Continue spironolactone 25 mg daily. - Continue losartan 50 mg daily.  - No SGLT2 inhibitor with incontinence, UTIs, yeast infections.   2. 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  Moderate PH on RHC in 04/2020.  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  Less likely group 1 PH.  No chronic PEs on 09/2020 V/Q scan. 3. Chronic hypoxemic respiratory failure: Remote smoker.  Hilar and mediastinal lymphadenopathy on chest CT, no reported ILD.  Recurrent aspiration PNA in setting of chronic right vocal cord paralysis.   - Stable on room air. Continue oxygen at night.  - Should follow with pulmonary.  4. CAD: s/p CABG in 2000.  Cath  in 04/2020 showed patent LIMA-LAD and SVG-D but occluded SVG-OM and SVG-RCA.  There was 60-70% ostial RCA stenosis and 95% proximal RCA stenosis with heavy calcification.  Discussed with interventional, with normal EF plan was to proceed with initial medical management and proceed to PCI if chest pain worsened (PCI would require atherectomy). No chest pain. - Continue ASA 81 and atorvastatin, good lipids 06/2022 - Continue Imdur 120 mg daily for angina control.  - Off metoprolol XL with bradycardia. 5. Atrial fibrillation: Permanent. Rate control is reasonable without nodal blocking agent.  - On warfarin. PCP manages INR. 6. PAD: Occluded right fem-pop bypass. No claudication.  - Follows with [facility]. 7. Carotid stenosis: 40-59% LICA stenosis on 12/2021 dopplers, followed at [facility].  - Due for repeat carotids, has them scheduled for [facility].  8. AAA: 5 cm AAA on 05/2022 abdominal CT.  EVAR 09/2022.  9. Incontinence: Follows closely with urology. On tamsulosin.   10. Bradycardia: Given IV atropine with HR 30s in 07/2022; he was asymptomatic.  Now off beta blocker.  Zio (08/2022) showed continuous atrial fibrillation, average HR 60 bpm. Rare PVCs.  No lightheadedness.  11. 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  12. HTN -BP 150/66, down to 136/66 after resting 10 minutes. Close  to goal, will not make any medication changes today. Encouraged him to start checking his BP 1-2 hours after he takes his BP medications and bringing log to clinic visit follow up.   Follow up 3 months with [name].   Karle Plumber, PharmD, BCPS, BCCP, CPP Heart Failure Clinic Pharmacist [phone]

## 2023-04-19 ENCOUNTER — Other Ambulatory Visit (HOSPITAL_COMMUNITY): Payer: Self-pay | Admitting: Cardiology

## 2023-04-19 DIAGNOSIS — I5032 Chronic diastolic (congestive) heart failure: Secondary | ICD-10-CM

## 2023-04-20 LAB — BASIC METABOLIC PANEL
BUN/Creatinine Ratio: 17 (ref 10–24)
BUN: 17 mg/dL (ref 8–27)
CO2: 23 mmol/L (ref 20–29)
Calcium: 9.3 mg/dL (ref 8.6–10.2)
Chloride: 105 mmol/L (ref 96–106)
Creatinine, Ser: 1.03 mg/dL (ref 0.76–1.27)
Glucose: 95 mg/dL (ref 70–99)
Potassium: 4.8 mmol/L (ref 3.5–5.2)
Sodium: 144 mmol/L (ref 134–144)
eGFR: 73 mL/min/{1.73_m2} (ref >59)

## 2023-04-24 ENCOUNTER — Encounter: Payer: Self-pay | Admitting: Urology

## 2023-04-30 ENCOUNTER — Ambulatory Visit: Payer: Medicare HMO | Admitting: Vascular Surgery

## 2023-04-30 ENCOUNTER — Other Ambulatory Visit: Payer: Self-pay

## 2023-04-30 ENCOUNTER — Ambulatory Visit (HOSPITAL_COMMUNITY)
Admission: RE | Admit: 2023-04-30 | Discharge: 2023-04-30 | Disposition: A | Discharge status: Home / Self Care | Payer: No Typology Code available for payment source | Source: Ambulatory Visit | Attending: Cardiology

## 2023-04-30 ENCOUNTER — Other Ambulatory Visit (HOSPITAL_COMMUNITY): Payer: Medicare HMO

## 2023-04-30 VITALS — BP 136/66 | HR 70 | Wt 206.2 lb

## 2023-04-30 DIAGNOSIS — I2510 Atherosclerotic heart disease of native coronary artery without angina pectoris: Secondary | ICD-10-CM | POA: Diagnosis not present

## 2023-04-30 DIAGNOSIS — I6529 Occlusion and stenosis of unspecified carotid artery: Secondary | ICD-10-CM | POA: Insufficient documentation

## 2023-04-30 DIAGNOSIS — Z7982 Long term (current) use of aspirin: Secondary | ICD-10-CM | POA: Insufficient documentation

## 2023-04-30 DIAGNOSIS — I110 Hypertensive heart disease with heart failure: Secondary | ICD-10-CM | POA: Diagnosis not present

## 2023-04-30 DIAGNOSIS — Z9981 Dependence on supplemental oxygen: Secondary | ICD-10-CM | POA: Diagnosis not present

## 2023-04-30 DIAGNOSIS — D649 Anemia, unspecified: Secondary | ICD-10-CM | POA: Diagnosis not present

## 2023-04-30 DIAGNOSIS — I7143 Infrarenal abdominal aortic aneurysm, without rupture: Secondary | ICD-10-CM

## 2023-04-30 DIAGNOSIS — Z951 Presence of aortocoronary bypass graft: Secondary | ICD-10-CM | POA: Diagnosis not present

## 2023-04-30 DIAGNOSIS — J9611 Chronic respiratory failure with hypoxia: Secondary | ICD-10-CM | POA: Insufficient documentation

## 2023-04-30 DIAGNOSIS — I714 Abdominal aortic aneurysm, without rupture, unspecified: Secondary | ICD-10-CM | POA: Diagnosis not present

## 2023-04-30 DIAGNOSIS — Z79899 Other long term (current) drug therapy: Secondary | ICD-10-CM | POA: Insufficient documentation

## 2023-04-30 DIAGNOSIS — F17210 Nicotine dependence, cigarettes, uncomplicated: Secondary | ICD-10-CM | POA: Insufficient documentation

## 2023-04-30 DIAGNOSIS — I4821 Permanent atrial fibrillation: Secondary | ICD-10-CM | POA: Insufficient documentation

## 2023-04-30 DIAGNOSIS — I739 Peripheral vascular disease, unspecified: Secondary | ICD-10-CM | POA: Insufficient documentation

## 2023-04-30 DIAGNOSIS — I272 Pulmonary hypertension, unspecified: Secondary | ICD-10-CM | POA: Insufficient documentation

## 2023-04-30 DIAGNOSIS — R32 Unspecified urinary incontinence: Secondary | ICD-10-CM | POA: Insufficient documentation

## 2023-04-30 DIAGNOSIS — I5032 Chronic diastolic (congestive) heart failure: Secondary | ICD-10-CM | POA: Diagnosis not present

## 2023-04-30 NOTE — Patient Instructions (Signed)
It was a pleasure seeing you today!  MEDICATIONS: -No medication changes today -I recommend you keep checking your blood pressure at home. Try to check it 1-2 hours after your morning medications consistently to get a good idea of what your blood pressure is on a day to day basis. -Call if you have questions about your medications.   NEXT APPOINTMENT: Return to clinic in 3 months with [name]. Call the Clinic in March to schedule your appointment [phone])  In general, to take care of your heart failure: -Limit your fluid intake to 2 Liters (half-gallon) per day.   -Limit your salt intake to ideally 2-3 grams (2000-3000 mg) per day. -Weigh yourself daily and record, and bring that "weight diary" to your next appointment.  (Weight gain of 2-3 pounds in 1 day typically means fluid weight.) -The medications for your heart are to help your heart and help you live longer.   -Please contact us before stopping any of your heart medications.  Call the clinic at [phone] with questions or to reschedule future appointments.

## 2023-05-02 DIAGNOSIS — G894 Chronic pain syndrome: Secondary | ICD-10-CM | POA: Diagnosis not present

## 2023-05-02 DIAGNOSIS — M1991 Primary osteoarthritis, unspecified site: Secondary | ICD-10-CM | POA: Diagnosis not present

## 2023-05-06 NOTE — Progress Notes (Deleted)
 Patient name: Troy [name] MRN: [id number] DOB: Mar 01, 1941 Sex: male  REASON FOR CONSULT: 6 month follow-up, surveillance EVAR  HPI: Troy [name] is a 83 y.o. male, presents for 6 month follow-up for surveillance of EVAR.  He most recently underwent EVAR on 09/26/2022 for stent graft repair of a 7 cm saccular abdominal aortic aneurysm.  He did require right common femoral endarterectomy with bovine patch after failure of the Perclose devices.   Past Medical History:  Diagnosis Date   A-fib [facility])    AAA (abdominal aortic aneurysm) (HCC)    Followed by Dr. Krystal Early   Arthritis    CAD (coronary artery disease)    a. CABG 2000.   Cancer [facility])    Prostate:  Radiation Tx   Carotid artery disease (HCC)    Chest pain    precordial. mild chronic .SABRA... nonischemic   CHF (congestive heart failure) (HCC)    Chronic edema    Coronary artery disease    a. Nuclear, January, 2008, no ischemia b. 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 Oris   Sinus bradycardia    Asymptomatic    Past Surgical History:  Procedure Laterality Date   ABDOMINAL AORTIC ENDOVASCULAR STENT GRAFT N/A 09/26/2022   Procedure: ABDOMINAL AORTIC ENDOVASCULAR STENT GRAFT;  Surgeon: Gretta Lonni PARAS, MD;  Location: [facility] OR;  Service: Vascular;  Laterality: N/A;   AORTOGRAM  09/26/2022   Procedure: AORTOGRAM;  Surgeon: Gretta Lonni PARAS, MD;  Location: [facility] OR;  Service:  Vascular;;   BALLOON DILATION  03/14/2023   Procedure: MERRILL HODGKIN;  Surgeon: Cinderella Deatrice FALCON, MD;  [facility];  Service: Endoscopy;;   BIOPSY  03/14/2023   Procedure: BIOPSY;  Surgeon: Cinderella Deatrice FALCON, MD;  [facility];  Service: Endoscopy;;   CARDIAC CATHETERIZATION  09/26/2012   1/4 patent bypass (occluded SVG-PDA, SVG-OM, LIMA-LAD), SVG-diagonal patent and fills the diagonal and LAD, distal RCA occlusion with left to right collateralization, patent circumflex, LAD with no flow-limiting disease and antegrade flow competitively from SVG-diagonal; EF 60-65%   COLONOSCOPY  11/30/2009   MFM:[email or web address]. next TCS 11/2019   COLONOSCOPY WITH PROPOFOL  N/A 12/18/2019   Procedure: COLONOSCOPY WITH PROPOFOL ;  Surgeon: Eartha Angelia Sieving, MD;  [facility];  Service: Gastroenterology;  Laterality: N/A;  1030   COLONOSCOPY WITH PROPOFOL  N/A 03/14/2023   Procedure: COLONOSCOPY WITH PROPOFOL ;  Surgeon: Cinderella Deatrice FALCON, MD;  [facility];  Service: Endoscopy;  Laterality: N/A;  12:45pm;asa 3   CORONARY ARTERY BYPASS GRAFT  2000   CYSTOSCOPY N/A 08/31/2021   Procedure: CYSTOSCOPY;  Surgeon: Sherrilee Belvie CROME, MD;  Location: [facility] ORS;  Service: Urology;  Laterality: N/A;  pt knows to arrive at 7:00   CYSTOSCOPY WITH FULGERATION N/A 03/22/2022   Procedure: CYSTOSCOPY  WITH FULGERATION;  Surgeon: Sherrilee Belvie CROME, MD;  Location: [facility] ORS;  Service: Urology;  Laterality: N/A;   ENDARTERECTOMY FEMORAL Right 09/26/2022   Procedure: RIGHT COMMON FEMORAL ENDARTERECTOMY WITH BOVINE PATCH;  Surgeon: Gretta Lonni PARAS, MD;  Location: [facility] OR;  Service: Vascular;  Laterality: Right;   ESOPHAGOGASTRODUODENOSCOPY N/A 08/12/2013   MFM:[id number] peptic stricture with erosive refluxesophagitis - status post Maloney dilation. Hiatal hernia. Abnormalgastric mucosa. Deformity of the pyloric channel suggestive ofprior peptic ulcer disease. Duodenal bulbar  diverticulum Statuspost gastric biopsy. h.pylori   ESOPHAGOGASTRODUODENOSCOPY (EGD) WITH PROPOFOL  N/A 03/14/2023   Procedure: ESOPHAGOGASTRODUODENOSCOPY (EGD) WITH PROPOFOL ;  Surgeon: Cinderella Deatrice FALCON, MD;  [facility];  Service: Endoscopy;  Laterality: N/A;  12:45pm;asa 3   LEFT HEART CATHETERIZATION WITH CORONARY/GRAFT ANGIOGRAM N/A 09/26/2012   Procedure: LEFT HEART CATHETERIZATION WITH EL BILE;  Surgeon: Lonni JONETTA Cash, MD;  Location: [facility] CATH LAB;  Service: Cardiovascular;  Laterality: N/A;   MALONEY DILATION N/A 08/12/2013   Procedure: AGAPITO DILATION;  Surgeon: Lamar CHRISTELLA Hollingshead, MD;  [facility];  Service: Endoscopy;  Laterality: N/A;   POLYPECTOMY  12/18/2019   Procedure: POLYPECTOMY;  Surgeon: Eartha Angelia Sieving, MD;  [facility];  Service: Gastroenterology;;  ascending colon polyp    POLYPECTOMY  03/14/2023   Procedure: POLYPECTOMY INTESTINAL;  Surgeon: Cinderella Deatrice FALCON, MD;  [facility];  Service: Endoscopy;;   PR VEIN BYPASS GRAFT,AORTO-FEM-POP Right 07/19/1999   PR VEIN BYPASS GRAFT,AORTO-FEM-POP Left 05/02/2006   RIGHT HEART CATH AND CORONARY/GRAFT ANGIOGRAPHY N/A 04/11/2020   Procedure: RIGHT HEART CATH AND CORONARY/GRAFT ANGIOGRAPHY;  Surgeon: Rolan Ezra RAMAN, MD;  Location: [facility] INVASIVE CV LAB;  Service: Cardiovascular;  Laterality: N/A;   SAVORY DILATION N/A 08/12/2013   Procedure: SAVORY DILATION;  Surgeon: Lamar CHRISTELLA Hollingshead, MD;  [facility];  Service: Endoscopy;  Laterality: N/A;   TRANSURETHRAL RESECTION OF BLADDER TUMOR N/A 08/31/2021   Procedure: TRANSURETHRAL RESECTION OF BLADDER TUMOR (TURBT);  Surgeon: Sherrilee Belvie CROME, MD;  Location: [facility]S;  Service: Urology;  Laterality: N/A;   ULTRASOUND GUIDANCE FOR VASCULAR ACCESS Bilateral 09/26/2022   Procedure: ULTRASOUND GUIDANCE FOR VASCULAR ACCESS;  Surgeon: Gretta Lonni PARAS, MD;  Location: [facility] OR;  Service: Vascular;  Laterality:  Bilateral;   VASCULAR SURGERY     WRIST SURGERY     cyst removal    Family History  Problem Relation Age of Onset   Deep vein thrombosis Father    Lung cancer Sister    Diabetes Sister    Heart disease Sister        After age 52   Hyperlipidemia Sister    Hypertension Sister    Lung cancer Sister    Breast cancer Sister    Hypertension Mother    Diabetes Sister    Coronary artery disease Other        family hx of   Cancer Brother        crab cancer   Heart disease Brother    Heart attack Brother    Heart attack Daughter    Colon cancer Neg Hx    Stroke Neg Hx     SOCIAL HISTORY: Social History   Socioeconomic History   Marital status: Widowed    Spouse name: Not on file   Number of children: Not on file   Years of education: Not on file   Highest education level: Not on file  Occupational History   Not on file  Tobacco  Use   Smoking status: Former    Current packs/day: 0.00    Average packs/day: 2.0 packs/day for 70.0 years (140.0 ttl pk-yrs)    Types: Cigarettes    Start date: 04/21/1948    Quit date: 04/21/2018    Years since quitting: 5.0   Smokeless tobacco: Never  Vaping Use   Vaping status: Never Used  Substance and Sexual Activity   Alcohol use: Yes    Alcohol/week: 0.0 - 2.0 standard drinks of alcohol    Comment: OCCASIONAL   Drug use: Never   Sexual activity: Not on file  Other Topics Concern   Not on file  Social History Narrative   Not on file   Social Drivers of Health   Financial Resource Strain: Not on file  Food Insecurity: No Food Insecurity (09/26/2022)   Hunger Vital Sign    Worried About Running Out of Food in the Last Year: Never true    Ran Out of Food in the Last Year: Never true  Transportation Needs: No Transportation Needs (09/26/2022)   PRAPARE - Administrator, Civil Service (Medical): No    Lack of Transportation (Non-Medical): No  Physical Activity: Not on file  Stress: Not on file  Social Connections:  Unknown (10/18/2021)   [facility]   Social Network    Social Network: Not on file  Intimate Partner Violence: Not At Risk (09/26/2022)   Humiliation, Afraid, Rape, and Kick questionnaire    Fear of Current or Ex-Partner: No    Emotionally Abused: No    Physically Abused: No    Sexually Abused: No    Allergies  Allergen Reactions   Niacin Itching and Rash    Burning sensation   Contrast Media [Iodinated Contrast Media] Nausea And Vomiting    Per patient vocal cords thick with increased phlegm    Gemtesa  [Vibegron ] Other (See Comments)    UTI    Iodine -131 Nausea And Vomiting   Jardiance  [Empagliflozin ] Other (See Comments)    UTI   Myrbetriq  [Mirabegron ] Other (See Comments)    UTI   Nsaids Other (See Comments)    Taking Coumadin     Current Outpatient Medications  Medication Sig Dispense Refill   acetaminophen  (TYLENOL ) 325 MG tablet Take 2 tablets (650 mg total) by mouth every 6 (six) hours as needed for mild pain, fever or headache (or Fever >/= 101). 12 tablet 0   albuterol  (VENTOLIN  HFA) 108 (90 Base) MCG/ACT inhaler Inhale 2 puffs into the lungs every 6 (six) hours as needed for wheezing or shortness of breath. 1 each 2   aspirin  81 MG EC tablet Take 1 tablet (81 mg total) by mouth daily with breakfast. 30 tablet 3   atorvastatin  (LIPITOR) 40 MG tablet Take 1 tablet (40 mg total) by mouth daily. 90 tablet 1   Cholecalciferol (VITAMIN D3) 50 MCG (2000 UT) capsule Take 2,000 Units by mouth daily.     dutasteride  (AVODART ) 0.5 MG capsule Take 1 capsule (0.5 mg total) by mouth daily. 30 capsule 11   HYDROcodone -acetaminophen  (NORCO) 10-325 MG tablet Take 1 tablet by mouth 2 (two) times daily as needed.     Incontinence Supplies (BARD [place] INCONTIN CLAMP) MISC 1 Units by Does not apply route daily. 1 each 0   ipratropium (ATROVENT ) 0.03 % nasal spray Place 2 sprays into both nostrils 2 (two) times daily as needed for rhinitis.     isosorbide   mononitrate (IMDUR ) 60 MG 24 hr tablet  Take 2 tablets (120 mg total) by mouth every evening. 180 tablet 3   latanoprost  (XALATAN ) 0.005 % ophthalmic solution Place 1 drop into both eyes at bedtime.     levocetirizine (XYZAL) 5 MG tablet Take 5 mg by mouth every evening.     loratadine (CLARITIN) 10 MG tablet Take 10 mg by mouth daily.     losartan  (COZAAR ) 50 MG tablet Take 1 tablet (50 mg total) by mouth daily. 30 tablet 3   metFORMIN  (GLUCOPHAGE ) 500 MG tablet Take 500 mg by mouth 2 (two) times daily with a meal.     nitrofurantoin  (MACRODANTIN ) 50 MG capsule Take 1 capsule (50 mg total) by mouth at bedtime. 30 capsule 5   nitroGLYCERIN  (NITROSTAT ) 0.4 MG SL tablet Place 1 tablet (0.4 mg total) under the tongue every 5 (five) minutes x 3 doses as needed for chest pain. 25 tablet 3   oxyCODONE -acetaminophen  (PERCOCET) 5-325 MG tablet Take 1 tablet by mouth every 6 (six) hours as needed for severe pain. 30 tablet 0   OXYGEN  Inhale 3 L into the lungs at bedtime.     pantoprazole  (PROTONIX ) 40 MG tablet Take 1 tablet (40 mg total) by mouth daily. Take 30-60 min before first meal of the day     polyethylene glycol powder (GLYCOLAX /MIRALAX ) 17 GM/SCOOP powder Take 8.5 g by mouth daily. (Patient taking differently: Take 8.5 g by mouth daily. As needed)     spironolactone  (ALDACTONE ) 25 MG tablet Take 1 tablet (25 mg total) by mouth daily. 90 tablet 3   torsemide  (DEMADEX ) 10 MG tablet Take 1 tablet (10 mg total) by mouth every 3 (three) days.     triamcinolone cream (KENALOG) 0.1 % Apply 1 Application topically 2 (two) times daily.     warfarin (COUMADIN ) 2 MG tablet Take 2 mg by mouth daily.     No current facility-administered medications for this visit.    REVIEW OF SYSTEMS:  [X]  denotes positive finding, [ ]  denotes negative finding Cardiac  Comments:  Chest pain or chest pressure:    Shortness of breath upon exertion:    Short of breath when lying flat:    Irregular heart rhythm:         Vascular    Pain in calf, thigh, or hip brought on by ambulation:    Pain in feet at night that wakes you up from your sleep:     Blood clot in your veins:    Leg swelling:         Pulmonary    Oxygen  at home:    Productive cough:     Wheezing:         Neurologic    Sudden weakness in arms or legs:     Sudden numbness in arms or legs:     Sudden onset of difficulty speaking or slurred speech:    Temporary loss of vision in one eye:     Problems with dizziness:         Gastrointestinal    Blood in stool:     Vomited blood:         Genitourinary    Burning when urinating:     Blood in urine:        Psychiatric    Major depression:         Hematologic    Bleeding problems:    Problems with blood clotting too easily:        Skin    Rashes or  ulcers:        Constitutional    Fever or chills:      PHYSICAL EXAM: There were no vitals filed for this visit.  GENERAL: The patient is a well-nourished male, in no acute distress. The vital signs are documented above. CARDIAC: There is a regular rate and rhythm.  VASCULAR:  Right groin incision well-healed Bilateral femoral pulses palpable Right DP brisk by Doppler multiphasic Left DP palpable PULMONARY: No respiratory distress. ABDOMEN: Soft and non-tender. MUSCULOSKELETAL: There are no major deformities or cyanosis. NEUROLOGIC: No focal weakness or paresthesias are detected. SKIN: There are no ulcers or rashes noted. PSYCHIATRIC: The patient has a normal affect.  DATA:   EVAR  CTA reviewed from 10/26/2022 with good position of the aortic stent graft and exclusion of the aneurysm with no endoleak.  There is a fluid collection in the right groin consistent with a hematoma or seroma with no gas.  Right common femoral endarterectomy site widely patent.  Assessment/Plan:  83 y.o. male, presents for 6 month follow-up for surveillance of EVAR.  He most recently underwent EVAR on 09/26/2022 for stent graft repair of a 7  cm saccular abdominal aortic aneurysm.  He did require right common femoral endarterectomy with bovine patch after failure of the Perclose devices.   Lonni DOROTHA Gaskins, MD [facility] Office: [phone]

## 2023-05-07 ENCOUNTER — Ambulatory Visit: Payer: Medicare HMO | Admitting: Vascular Surgery

## 2023-05-07 ENCOUNTER — Other Ambulatory Visit: Payer: Medicare HMO

## 2023-05-15 ENCOUNTER — Ambulatory Visit (INDEPENDENT_AMBULATORY_CARE_PROVIDER_SITE_OTHER): Payer: No Typology Code available for payment source | Admitting: Urology

## 2023-05-15 VITALS — BP 142/69 | HR 66

## 2023-05-15 DIAGNOSIS — N3941 Urge incontinence: Secondary | ICD-10-CM | POA: Diagnosis not present

## 2023-05-15 DIAGNOSIS — R310 Gross hematuria: Secondary | ICD-10-CM

## 2023-05-15 DIAGNOSIS — N5082 Scrotal pain: Secondary | ICD-10-CM

## 2023-05-15 NOTE — Progress Notes (Signed)
Bladder Scan completed today.  Patient cannot void prior to the bladder scan. Bladder scan result: 0  Additional notes- patient states he voided before he left home.

## 2023-05-15 NOTE — H&P (View-Only) (Signed)
 05/15/2023 10:18 AM   Troy [name] February 07, 1941 [id number]  Referring provider: Kathrin Penner, PA-C [address] [place],  [place] [address]  Urinary incontinence and recurrent UTI   HPI: [name] is a 82yo here for followup for urinary incontinence and recurrent UTI. He went for UDS at [facility] and the UDS catheter could not be placed. He continues to have continues to have continuous urinary incontinence. He uses 7-9 diapers per day which are soaked. He has a hx of radiation cystitis. He had required foley catheter placement but with an indwelling foley he gets recurrent epididymo-orchitis.    PMH: Past Medical History:  Diagnosis Date   A-fib [facility])    AAA (abdominal aortic aneurysm) (HCC)    Followed by Dr. Tawanna Cooler Early   Arthritis    CAD (coronary artery disease)    a. CABG 2000.   Cancer [facility])    Prostate:  Radiation Tx   Carotid artery disease (HCC)    Chest pain    precordial. mild chronic .[name]... nonischemic   CHF (congestive heart failure) (HCC)    Chronic edema    Coronary artery disease    a. Nuclear, January, 2008, no ischemia b. 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 Arbie Cookey   Sinus bradycardia    Asymptomatic    Surgical History: Past Surgical History:  Procedure Laterality Date   ABDOMINAL AORTIC ENDOVASCULAR STENT GRAFT N/A 09/26/2022   Procedure: ABDOMINAL AORTIC ENDOVASCULAR STENT GRAFT;  Surgeon: Cephus Shelling, MD;  Location: [facility] OR;  Service: Vascular;  Laterality: N/A;   AORTOGRAM  09/26/2022   Procedure: AORTOGRAM;  Surgeon: Cephus Shelling, MD;  Location: [facility] OR;  Service: Vascular;;   BALLOON DILATION  03/14/2023   Procedure: [place];  Surgeon: Franky Macho, MD;  Location: [facility] ENDO [facility]ITE;  Service: Endoscopy;;   BIOPSY  03/14/2023   Procedure: BIOPSY;  Surgeon: Franky Macho, MD;  Location: [facility];  Service: Endoscopy;;   CARDIAC CATHETERIZATION  09/26/2012   1/4 patent bypass (occluded SVG-PDA, SVG-OM, LIMA-LAD), SVG-diagonal patent and fills the diagonal and LAD, distal RCA occlusion with left to right collateralization, patent circumflex, LAD with no flow-limiting disease and antegrade flow competitively from SVG-diagonal; EF 60-65%   COLONOSCOPY  11/30/2009   XBJ:[id number]. next TCS 11/2019   COLONOSCOPY WITH PROPOFOL N/A 12/18/2019   Procedure: COLONOSCOPY WITH PROPOFOL;  Surgeon: Dolores Frame, MD;  Location: [facility];  Service: Gastroenterology;  Laterality: N/A;  1030   COLONOSCOPY WITH PROPOFOL N/A 03/14/2023   Procedure: COLONOSCOPY WITH PROPOFOL;  Surgeon: Franky Macho, MD;  Location: [facility];  Service: Endoscopy;  Laterality: N/A;  12:45pm;asa 3   CORONARY ARTERY BYPASS GRAFT  2000   CYSTOSCOPY N/A 08/31/2021   Procedure: CYSTOSCOPY;  Surgeon: Malen Gauze,  MD;  Location: [facility] ORS;  Service: Urology;  Laterality: N/A;  pt knows to arrive at 7:00   CYSTOSCOPY WITH FULGERATION N/A 03/22/2022   Procedure: CYSTOSCOPY WITH FULGERATION;  Surgeon: Malen Gauze, MD;  Location: [facility] ORS;  Service: Urology;  Laterality: N/A;   ENDARTERECTOMY FEMORAL Right 09/26/2022   Procedure: RIGHT COMMON FEMORAL ENDARTERECTOMY WITH BOVINE PATCH;  Surgeon: Cephus Shelling, MD;  Location[facility] OR;  Service: Vascular;  Laterality: Right;   ESOPHAGOGASTRODUODENOSCOPY N/A 08/12/2013   ZOX:[id number] peptic stricture with  erosive refluxesophagitis - status post Maloney dilation. Hiatal hernia. Abnormalgastric mucosa. Deformity of the pyloric channel suggestive ofprior peptic ulcer disease. Duodenal bulbar diverticulum Statuspost gastric biopsy. h.pylori   ESOPHAGOGASTRODUODENOSCOPY (EGD) WITH PROPOFOL N/A 03/14/2023   Procedure: ESOPHAGOGASTRODUODENOSCOPY (EGD) WITH PROPOFOL;  Surgeon: Franky Macho, MD;  [facility];  Service: Endoscopy;  Laterality: N/A;  12:45pm;asa 3   LEFT HEART CATHETERIZATION WITH CORONARY/GRAFT ANGIOGRAM N/A 09/26/2012   Procedure: LEFT HEART CATHETERIZATION WITH Isabel Caprice;  Surgeon: Kathleene Hazel, MD;  Location: [facility] CATH LAB;  Service: Cardiovascular;  Laterality: N/A;   MALONEY DILATION N/A 08/12/2013   Procedure: Elease Hashimoto DILATION;  Surgeon: Corbin Ade, MD;  [facility];  Service: Endoscopy;  Laterality: N/A;   POLYPECTOMY  12/18/2019   Procedure: POLYPECTOMY;  Surgeon: Dolores Frame, MD;  [facility];  Service: Gastroenterology;;  ascending colon polyp    POLYPECTOMY  03/14/2023   Procedure: POLYPECTOMY INTESTINAL;  Surgeon: Franky Macho, MD;  [facility];  Service: Endoscopy;;   PR VEIN BYPASS GRAFT,AORTO-FEM-POP Right 07/19/1999   PR VEIN BYPASS GRAFT,AORTO-FEM-POP Left 05/02/2006   RIGHT HEART CATH AND CORONARY/GRAFT ANGIOGRAPHY N/A 04/11/2020   Procedure: RIGHT HEART CATH AND CORONARY/GRAFT ANGIOGRAPHY;  Surgeon: Laurey Morale, MD;  Location: [facility] INVASIVE CV LAB;  Service: Cardiovascular;  Laterality: N/A;   SAVORY DILATION N/A 08/12/2013   Procedure: SAVORY DILATION;  Surgeon: Corbin Ade, MD;  [facility];  Service: Endoscopy;  Laterality: N/A;   TRANSURETHRAL RESECTION OF BLADDER TUMOR N/A 08/31/2021   Procedure: TRANSURETHRAL RESECTION OF BLADDER TUMOR (TURBT);  Surgeon: Malen Gauze, MD;  Location: [facility] ORS;  Service: Urology;  Laterality: N/A;   ULTRASOUND  GUIDANCE FOR VASCULAR ACCESS Bilateral 09/26/2022   Procedure: ULTRASOUND GUIDANCE FOR VASCULAR ACCESS;  Surgeon: Cephus Shelling, MD;  Location: [facility] OR;  Service: Vascular;  Laterality: Bilateral;   VASCULAR SURGERY     WRIST SURGERY     cyst removal    Home Medications:  Allergies as of 05/15/2023       Reactions   Niacin Itching, Rash   Burning sensation   Contrast Media [iodinated Contrast Media] Nausea And Vomiting   Per patient vocal cords thick with increased phlegm    Gemtesa [vibegron] Other (See Comments)   UTI    Iodine-131 Nausea And Vomiting   Jardiance [empagliflozin] Other (See Comments)   UTI   Myrbetriq [mirabegron] Other (See Comments)   UTI   Nsaids Other (See Comments)   Taking Coumadin        Medication List        Accurate as of May 15, 2023 10:18 AM. If you have any questions, ask your nurse or doctor.          acetaminophen 325 MG tablet Commonly known as: TYLENOL Take 2 tablets (650 mg total) by mouth every 6 (six) hours as needed for mild pain, fever or headache (  or Fever >/= 101).   albuterol 108 (90 Base) MCG/ACT inhaler Commonly known as: VENTOLIN HFA Inhale 2 puffs into the lungs every 6 (six) hours as needed for wheezing or shortness of breath.   aspirin EC 81 MG tablet Take 1 tablet (81 mg total) by mouth daily with breakfast.   atorvastatin 40 MG tablet Commonly known as: LIPITOR Take 1 tablet (40 mg total) by mouth daily.   Bard Cunningham Incontin Clamp Misc 1 Units by Does not apply route daily.   dutasteride 0.5 MG capsule Commonly known as: AVODART Take 1 capsule (0.5 mg total) by mouth daily.   HYDROcodone-acetaminophen 10-325 MG tablet Commonly known as: NORCO Take 1 tablet by mouth 2 (two) times daily as needed.   ipratropium 0.03 % nasal spray Commonly known as: ATROVENT Place 2 sprays into both nostrils 2 (two) times daily as needed for rhinitis.   isosorbide mononitrate 60 MG 24 hr tablet Commonly  known as: IMDUR Take 2 tablets (120 mg total) by mouth every evening.   latanoprost 0.005 % ophthalmic solution Commonly known as: XALATAN Place 1 drop into both eyes at bedtime.   levocetirizine 5 MG tablet Commonly known as: XYZAL Take 5 mg by mouth every evening.   loratadine 10 MG tablet Commonly known as: CLARITIN Take 10 mg by mouth daily.   losartan 50 MG tablet Commonly known as: COZAAR Take 1 tablet (50 mg total) by mouth daily.   metFORMIN 500 MG tablet Commonly known as: GLUCOPHAGE Take 500 mg by mouth 2 (two) times daily with a meal.   nitrofurantoin 50 MG capsule Commonly known as: Macrodantin Take 1 capsule (50 mg total) by mouth at bedtime.   nitroGLYCERIN 0.4 MG SL tablet Commonly known as: NITROSTAT Place 1 tablet (0.4 mg total) under the tongue every 5 (five) minutes x 3 doses as needed for chest pain.   oxyCODONE-acetaminophen 5-325 MG tablet Commonly known as: Percocet Take 1 tablet by mouth every 6 (six) hours as needed for severe pain.   OXYGEN Inhale 3 L into the lungs at bedtime.   pantoprazole 40 MG tablet Commonly known as: PROTONIX Take 1 tablet (40 mg total) by mouth daily. Take 30-60 min before first meal of the day   polyethylene glycol powder 17 GM/SCOOP powder Commonly known as: GLYCOLAX/MIRALAX Take 8.5 g by mouth daily. What changed: additional instructions   spironolactone 25 MG tablet Commonly known as: ALDACTONE Take 1 tablet (25 mg total) by mouth daily.   torsemide 10 MG tablet Commonly known as: DEMADEX Take 1 tablet (10 mg total) by mouth every 3 (three) days.   triamcinolone cream 0.1 % Commonly known as: KENALOG Apply 1 Application topically 2 (two) times daily.   Vitamin D3 50 MCG (2000 UT) capsule Take 2,000 Units by mouth daily.   warfarin 2 MG tablet Commonly known as: COUMADIN Take 2 mg by mouth daily.        Allergies:  Allergies  Allergen Reactions   Niacin Itching and Rash    Burning sensation    Contrast Media [Iodinated Contrast Media] Nausea And Vomiting    Per patient vocal cords thick with increased phlegm    Gemtesa [Vibegron] Other (See Comments)    UTI    Iodine-131 Nausea And Vomiting   Jardiance [Empagliflozin] Other (See Comments)    UTI   Myrbetriq [Mirabegron] Other (See Comments)    UTI   Nsaids Other (See Comments)    Taking Coumadin    Family History: Family  History  Problem Relation Age of Onset   Deep vein thrombosis Father    Lung cancer Sister    Diabetes Sister    Heart disease Sister        After age 22   Hyperlipidemia Sister    Hypertension Sister    Lung cancer Sister    Breast cancer Sister    Hypertension Mother    Diabetes Sister    Coronary artery disease Other        family hx of   Cancer Brother        "crab cancer"   Heart disease Brother    Heart attack Brother    Heart attack Daughter    Colon cancer Neg Hx    Stroke Neg Hx     Social History:  reports that he quit smoking about 5 years ago. His smoking use included cigarettes. He started smoking about 75 years ago. He has a 140 pack-year smoking history. He has never used smokeless tobacco. He reports current alcohol use. He reports that he does not use drugs.  ROS: All other review of systems were reviewed and are negative except what is noted above in HPI  Physical Exam: BP (!) 142/69   Pulse 66   Constitutional:  Alert and oriented, No acute distress. HEENT: [place] AT, moist mucus membranes.  Trachea midline, no masses. Cardiovascular: No clubbing, cyanosis, or edema. Respiratory: Normal respiratory effort, no increased work of breathing. GI: Abdomen is soft, nontender, nondistended, no abdominal masses GU: No CVA tenderness.  Lymph: No cervical or inguinal lymphadenopathy. Skin: No rashes, bruises or suspicious lesions. Neurologic: Grossly intact, no focal deficits, moving all 4 extremities. Psychiatric: Normal mood and affect.  Laboratory Data: Lab Results   Component Value Date   WBC 8.3 12/11/2022   HGB 12.2 (L) 12/11/2022   HCT 39.9 12/11/2022   MCV 89.9 12/11/2022   PLT 241 12/11/2022    Lab Results  Component Value Date   CREATININE 1.03 04/19/2023    No results found for: "PSA"  No results found for: "TESTOSTERONE"  Lab Results  Component Value Date   HGBA1C 7.5 (H) 09/17/2022    Urinalysis    Component Value Date/Time   COLORURINE YELLOW 09/30/2022 1810   APPEARANCEUR Clear 02/27/2023 1355   LABSPEC 1.006 09/30/2022 1810   PHURINE 7.0 09/30/2022 1810   GLUCOSEU Negative 02/27/2023 1355   HGBUR SMALL (A) 09/30/2022 1810   BILIRUBINUR Negative 02/27/2023 1355   KETONESUR NEGATIVE 09/30/2022 1810   PROTEINUR Negative 02/27/2023 1355   PROTEINUR NEGATIVE 09/30/2022 1810   NITRITE Negative 02/27/2023 1355   NITRITE NEGATIVE 09/30/2022 1810   LEUKOCYTESUR 1+ (A) 02/27/2023 1355   LEUKOCYTESUR NEGATIVE 09/30/2022 1810    Lab Results  Component Value Date   LABMICR See below: 02/27/2023   WBCUA 6-10 (A) 02/27/2023   LABEPIT 0-10 02/27/2023   MUCUS Present (A) 01/16/2023   BACTERIA None seen 02/27/2023    Pertinent Imaging: *** No results found for this or any previous visit.  No results found for this or any previous visit.  No results found for this or any previous visit.  No results found for this or any previous visit.  No results found for this or any previous visit.  No results found for this or any previous visit.  Results for orders placed during the hospital encounter of 08/18/21  CT HEMATURIA WORKUP  Narrative CLINICAL DATA:  Gross hematuria since early May, history of prostate cancer * Tracking Code:  BO *  EXAM: CT ABDOMEN AND PELVIS WITHOUT AND WITH CONTRAST  TECHNIQUE: Multidetector CT imaging of the abdomen and pelvis was performed following the standard protocol before and following the bolus administration of intravenous contrast.  RADIATION DOSE REDUCTION: This exam was  performed according to the departmental dose-optimization program which includes automated exposure control, adjustment of the mA and/or kV according to patient size and/or use of iterative reconstruction technique.  CONTRAST:  OMNIPAQUE IOHEXOL 300 MG/ML  SOLN  COMPARISON:  None Available.  FINDINGS: Lower chest: No acute abnormality. Cardiomegaly. Coronary artery calcifications.  Hepatobiliary: No solid liver abnormality is seen. Small, definitively benign cyst or hemangioma of the central liver dome for which no further follow-up or characterization is required (series 15, image 30). No gallstones, gallbladder wall thickening, or biliary dilatation.  Pancreas: Unremarkable. No pancreatic ductal dilatation or surrounding inflammatory changes.  Spleen: Normal in size without significant abnormality.  Adrenals/Urinary Tract: Adrenal glands are unremarkable. Small simple, benign bilateral renal cortical cysts, as well as intrinsically hyperdense, nonenhancing hemorrhagic or proteinaceous cysts of the midportions of the left and right kidneys (series 2, image 37). No urinary tract filling defect on delayed phase imaging. Bladder is unremarkable.  Stomach/Bowel: Stomach is within normal limits. Appendix appears normal. No evidence of bowel wall thickening, distention, or inflammatory changes. Descending and sigmoid diverticulosis.  Vascular/Lymphatic: Aortic atherosclerosis. 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 No enlarged abdominal or pelvic lymph nodes.  Reproductive: Prostate brachytherapy.  Other: Small, fat containing bilateral inguinal hernias. No ascites.  Musculoskeletal: No acute or  significant osseous findings.  IMPRESSION: 1. No evidence of urinary tract mass, calculus, or hydronephrosis. No urinary tract filling defect on delayed phase imaging. 2. Prostate brachytherapy. No evidence of mass or lymphadenopathy in the abdomen or pelvis. 3. Small simple, benign bilateral renal cortical cysts, as well as nonenhancing, benign hemorrhagic or proteinaceous cysts. No further follow-up or characterization is required for these benign renal cysts. 4. 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 Recommend referral to a vascular specialist. This recommendation follows ACR consensus guidelines: White Paper of the ACR Incidental Findings Committee II on Vascular Findings. J Am Coll Radiol 2013; 10:789-794. 5. Cardiomegaly and coronary artery disease.  Aortic Atherosclerosis (ICD10-I70.0).   Electronically Signed By: Jearld Lesch M.D. On: 08/19/2021 11:29  No results found for this or any previous visit.   Assessment & Plan:    1. Scrotal pain (Primary) resolved  2. Gross hematuria Continue avodart  3. Urinary incontinence We discussed indwelling foley, SP tube and intravesical botox and after discussing the options the patient elects for botox   No follow-ups on file.  Wilkie Aye, MD  [facility] [place]

## 2023-05-15 NOTE — Progress Notes (Signed)
05/15/2023 10:18 AM   Troy [name] February 07, 1941 [id number]  Referring provider: Kathrin Penner, PA-C [address] [place],  [place] [address]  Urinary incontinence and recurrent UTI   HPI: [name] is a 83yo here for followup for urinary incontinence and recurrent UTI. He went for UDS at [facility] and the UDS catheter could not be placed. He continues to have continues to have continuous urinary incontinence. He uses 7-9 diapers per day which are soaked. He has a hx of radiation cystitis. He had required foley catheter placement but with an indwelling foley he gets recurrent epididymo-orchitis.    PMH: Past Medical History:  Diagnosis Date   A-fib [facility])    AAA (abdominal aortic aneurysm) (HCC)    Followed by Dr. Tawanna Cooler Early   Arthritis    CAD (coronary artery disease)    a. CABG 2000.   Cancer [facility])    Prostate:  Radiation Tx   Carotid artery disease (HCC)    Chest pain    precordial. mild chronic .[name]... nonischemic   CHF (congestive heart failure) (HCC)    Chronic edema    Coronary artery disease    a. Nuclear, January, 2008, no ischemia b. 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 Arbie Cookey   Sinus bradycardia    Asymptomatic    Surgical History: Past Surgical History:  Procedure Laterality Date   ABDOMINAL AORTIC ENDOVASCULAR STENT GRAFT N/A 09/26/2022   Procedure: ABDOMINAL AORTIC ENDOVASCULAR STENT GRAFT;  Surgeon: Cephus Shelling, MD;  Location: [facility] OR;  Service: Vascular;  Laterality: N/A;   AORTOGRAM  09/26/2022   Procedure: AORTOGRAM;  Surgeon: Cephus Shelling, MD;  Location: [facility] OR;  Service: Vascular;;   BALLOON DILATION  03/14/2023   Procedure: [place];  Surgeon: Franky Macho, MD;  Location: [facility] ENDO SUITE;  Service: Endoscopy;;   BIOPSY  03/14/2023   Procedure: BIOPSY;  Surgeon: Franky Macho, MD;  Location: [facility];  Service: Endoscopy;;   CARDIAC CATHETERIZATION  09/26/2012   1/4 patent bypass (occluded SVG-PDA, SVG-OM, LIMA-LAD), SVG-diagonal patent and fills the diagonal and LAD, distal RCA occlusion with left to right collateralization, patent circumflex, LAD with no flow-limiting disease and antegrade flow competitively from SVG-diagonal; EF 60-65%   COLONOSCOPY  11/30/2009   XBJ:[id number]. next TCS 11/2019   COLONOSCOPY WITH PROPOFOL N/A 12/18/2019   Procedure: COLONOSCOPY WITH PROPOFOL;  Surgeon: Dolores Frame, MD;  Location: [facility];  Service: Gastroenterology;  Laterality: N/A;  1030   COLONOSCOPY WITH PROPOFOL N/A 03/14/2023   Procedure: COLONOSCOPY WITH PROPOFOL;  Surgeon: Franky Macho, MD;  Location: [facility];  Service: Endoscopy;  Laterality: N/A;  12:45pm;asa 3   CORONARY ARTERY BYPASS GRAFT  2000   CYSTOSCOPY N/A 08/31/2021   Procedure: CYSTOSCOPY;  Surgeon: Malen Gauze,  MD;  Location: [facility] ORS;  Service: Urology;  Laterality: N/A;  pt knows to arrive at 7:00   CYSTOSCOPY WITH FULGERATION N/A 03/22/2022   Procedure: CYSTOSCOPY WITH FULGERATION;  Surgeon: Malen Gauze, MD;  Location: [facility] ORS;  Service: Urology;  Laterality: N/A;   ENDARTERECTOMY FEMORAL Right 09/26/2022   Procedure: RIGHT COMMON FEMORAL ENDARTERECTOMY WITH BOVINE PATCH;  Surgeon: Cephus Shelling, MD;  Location[facility] OR;  Service: Vascular;  Laterality: Right;   ESOPHAGOGASTRODUODENOSCOPY N/A 08/12/2013   [id number] peptic stricture with  erosive refluxesophagitis - status post Maloney dilation. Hiatal hernia. Abnormalgastric mucosa. Deformity of the pyloric channel suggestive ofprior peptic ulcer disease. Duodenal bulbar diverticulum Statuspost gastric biopsy. h.pylori   ESOPHAGOGASTRODUODENOSCOPY (EGD) WITH PROPOFOL N/A 03/14/2023   Procedure: ESOPHAGOGASTRODUODENOSCOPY (EGD) WITH PROPOFOL;  Surgeon: Franky Macho, MD;  [facility];  Service: Endoscopy;  Laterality: N/A;  12:45pm;asa 3   LEFT HEART CATHETERIZATION WITH CORONARY/GRAFT ANGIOGRAM N/A 09/26/2012   Procedure: LEFT HEART CATHETERIZATION WITH Isabel Caprice;  Surgeon: Kathleene Hazel, MD;  Location: [facility] CATH LAB;  Service: Cardiovascular;  Laterality: N/A;   MALONEY DILATION N/A 08/12/2013   Procedure: Elease Hashimoto DILATION;  Surgeon: Corbin Ade, MD;  [facility];  Service: Endoscopy;  Laterality: N/A;   POLYPECTOMY  12/18/2019   Procedure: POLYPECTOMY;  Surgeon: Dolores Frame, MD;  [facility]: [facility];  Service: Gastroenterology;;  ascending colon polyp    POLYPECTOMY  03/14/2023   Procedure: POLYPECTOMY INTESTINAL;  Surgeon: Franky Macho, MD;  [facility];  Service: Endoscopy;;   PR VEIN BYPASS GRAFT,AORTO-FEM-POP Right 07/19/1999   PR VEIN BYPASS GRAFT,AORTO-FEM-POP Left 05/02/2006   RIGHT HEART CATH AND CORONARY/GRAFT ANGIOGRAPHY N/A 04/11/2020   Procedure: RIGHT HEART CATH AND CORONARY/GRAFT ANGIOGRAPHY;  Surgeon: Laurey Morale, MD;  Location: [facility] INVASIVE CV LAB;  Service: Cardiovascular;  Laterality: N/A;   SAVORY DILATION N/A 08/12/2013   Procedure: SAVORY DILATION;  Surgeon: Corbin Ade, MD;  [facility];  Service: Endoscopy;  Laterality: N/A;   TRANSURETHRAL RESECTION OF BLADDER TUMOR N/A 08/31/2021   Procedure: TRANSURETHRAL RESECTION OF BLADDER TUMOR (TURBT);  Surgeon: Malen Gauze, MD;  Location: [facility] ORS;  Service: Urology;  Laterality: N/A;   ULTRASOUND  GUIDANCE FOR VASCULAR ACCESS Bilateral 09/26/2022   Procedure: ULTRASOUND GUIDANCE FOR VASCULAR ACCESS;  Surgeon: Cephus Shelling, MD;  Location: [facility] OR;  Service: Vascular;  Laterality: Bilateral;   VASCULAR SURGERY     WRIST SURGERY     cyst removal    Home Medications:  Allergies as of 05/15/2023       Reactions   Niacin Itching, Rash   Burning sensation   Contrast Media [iodinated Contrast Media] Nausea And Vomiting   Per patient vocal cords thick with increased phlegm    Gemtesa [vibegron] Other (See Comments)   UTI    Iodine-131 Nausea And Vomiting   Jardiance [empagliflozin] Other (See Comments)   UTI   Myrbetriq [mirabegron] Other (See Comments)   UTI   Nsaids Other (See Comments)   Taking Coumadin        Medication List        Accurate as of May 15, 2023 10:18 AM. If you have any questions, ask your nurse or doctor.          acetaminophen 325 MG tablet Commonly known as: TYLENOL Take 2 tablets (650 mg total) by mouth every 6 (six) hours as needed for mild pain, fever or headache (  or Fever >/= 101).   albuterol 108 (90 Base) MCG/ACT inhaler Commonly known as: VENTOLIN HFA Inhale 2 puffs into the lungs every 6 (six) hours as needed for wheezing or shortness of breath.   aspirin EC 81 MG tablet Take 1 tablet (81 mg total) by mouth daily with breakfast.   atorvastatin 40 MG tablet Commonly known as: LIPITOR Take 1 tablet (40 mg total) by mouth daily.   Bard Cunningham Incontin Clamp Misc 1 Units by Does not apply route daily.   dutasteride 0.5 MG capsule Commonly known as: AVODART Take 1 capsule (0.5 mg total) by mouth daily.   HYDROcodone-acetaminophen 10-325 MG tablet Commonly known as: NORCO Take 1 tablet by mouth 2 (two) times daily as needed.   ipratropium 0.03 % nasal spray Commonly known as: ATROVENT Place 2 sprays into both nostrils 2 (two) times daily as needed for rhinitis.   isosorbide mononitrate 60 MG 24 hr tablet Commonly  known as: IMDUR Take 2 tablets (120 mg total) by mouth every evening.   latanoprost 0.005 % ophthalmic solution Commonly known as: XALATAN Place 1 drop into both eyes at bedtime.   levocetirizine 5 MG tablet Commonly known as: XYZAL Take 5 mg by mouth every evening.   loratadine 10 MG tablet Commonly known as: CLARITIN Take 10 mg by mouth daily.   losartan 50 MG tablet Commonly known as: COZAAR Take 1 tablet (50 mg total) by mouth daily.   metFORMIN 500 MG tablet Commonly known as: GLUCOPHAGE Take 500 mg by mouth 2 (two) times daily with a meal.   nitrofurantoin 50 MG capsule Commonly known as: Macrodantin Take 1 capsule (50 mg total) by mouth at bedtime.   nitroGLYCERIN 0.4 MG SL tablet Commonly known as: NITROSTAT Place 1 tablet (0.4 mg total) under the tongue every 5 (five) minutes x 3 doses as needed for chest pain.   oxyCODONE-acetaminophen 5-325 MG tablet Commonly known as: Percocet Take 1 tablet by mouth every 6 (six) hours as needed for severe pain.   OXYGEN Inhale 3 L into the lungs at bedtime.   pantoprazole 40 MG tablet Commonly known as: PROTONIX Take 1 tablet (40 mg total) by mouth daily. Take 30-60 min before first meal of the day   polyethylene glycol powder 17 GM/SCOOP powder Commonly known as: GLYCOLAX/MIRALAX Take 8.5 g by mouth daily. What changed: additional instructions   spironolactone 25 MG tablet Commonly known as: ALDACTONE Take 1 tablet (25 mg total) by mouth daily.   torsemide 10 MG tablet Commonly known as: DEMADEX Take 1 tablet (10 mg total) by mouth every 3 (three) days.   triamcinolone cream 0.1 % Commonly known as: KENALOG Apply 1 Application topically 2 (two) times daily.   Vitamin D3 50 MCG (2000 UT) capsule Take 2,000 Units by mouth daily.   warfarin 2 MG tablet Commonly known as: COUMADIN Take 2 mg by mouth daily.        Allergies:  Allergies  Allergen Reactions   Niacin Itching and Rash    Burning sensation    Contrast Media [Iodinated Contrast Media] Nausea And Vomiting    Per patient vocal cords thick with increased phlegm    Gemtesa [Vibegron] Other (See Comments)    UTI    Iodine-131 Nausea And Vomiting   Jardiance [Empagliflozin] Other (See Comments)    UTI   Myrbetriq [Mirabegron] Other (See Comments)    UTI   Nsaids Other (See Comments)    Taking Coumadin    Family History: Family  History  Problem Relation Age of Onset   Deep vein thrombosis Father    Lung cancer Sister    Diabetes Sister    Heart disease Sister        After age 22   Hyperlipidemia Sister    Hypertension Sister    Lung cancer Sister    Breast cancer Sister    Hypertension Mother    Diabetes Sister    Coronary artery disease Other        family hx of   Cancer Brother        "crab cancer"   Heart disease Brother    Heart attack Brother    Heart attack Daughter    Colon cancer Neg Hx    Stroke Neg Hx     Social History:  reports that he quit smoking about 5 years ago. His smoking use included cigarettes. He started smoking about 75 years ago. He has a 140 pack-year smoking history. He has never used smokeless tobacco. He reports current alcohol use. He reports that he does not use drugs.  ROS: All other review of systems were reviewed and are negative except what is noted above in HPI  Physical Exam: BP (!) 142/69   Pulse 66   Constitutional:  Alert and oriented, No acute distress. HEENT: [place] AT, moist mucus membranes.  Trachea midline, no masses. Cardiovascular: No clubbing, cyanosis, or edema. Respiratory: Normal respiratory effort, no increased work of breathing. GI: Abdomen is soft, nontender, nondistended, no abdominal masses GU: No CVA tenderness.  Lymph: No cervical or inguinal lymphadenopathy. Skin: No rashes, bruises or suspicious lesions. Neurologic: Grossly intact, no focal deficits, moving all 4 extremities. Psychiatric: Normal mood and affect.  Laboratory Data: Lab Results   Component Value Date   WBC 8.3 12/11/2022   HGB 12.2 (L) 12/11/2022   HCT 39.9 12/11/2022   MCV 89.9 12/11/2022   PLT 241 12/11/2022    Lab Results  Component Value Date   CREATININE 1.03 04/19/2023    No results found for: "PSA"  No results found for: "TESTOSTERONE"  Lab Results  Component Value Date   HGBA1C 7.5 (H) 09/17/2022    Urinalysis    Component Value Date/Time   COLORURINE YELLOW 09/30/2022 1810   APPEARANCEUR Clear 02/27/2023 1355   LABSPEC 1.006 09/30/2022 1810   PHURINE 7.0 09/30/2022 1810   GLUCOSEU Negative 02/27/2023 1355   HGBUR SMALL (A) 09/30/2022 1810   BILIRUBINUR Negative 02/27/2023 1355   KETONESUR NEGATIVE 09/30/2022 1810   PROTEINUR Negative 02/27/2023 1355   PROTEINUR NEGATIVE 09/30/2022 1810   NITRITE Negative 02/27/2023 1355   NITRITE NEGATIVE 09/30/2022 1810   LEUKOCYTESUR 1+ (A) 02/27/2023 1355   LEUKOCYTESUR NEGATIVE 09/30/2022 1810    Lab Results  Component Value Date   LABMICR See below: 02/27/2023   WBCUA 6-10 (A) 02/27/2023   LABEPIT 0-10 02/27/2023   MUCUS Present (A) 01/16/2023   BACTERIA None seen 02/27/2023    Pertinent Imaging: *** No results found for this or any previous visit.  No results found for this or any previous visit.  No results found for this or any previous visit.  No results found for this or any previous visit.  No results found for this or any previous visit.  No results found for this or any previous visit.  Results for orders placed during the hospital encounter of 08/18/21  CT HEMATURIA WORKUP  Narrative CLINICAL DATA:  Gross hematuria since early May, history of prostate cancer * Tracking Code:  BO *  EXAM: CT ABDOMEN AND PELVIS WITHOUT AND WITH CONTRAST  TECHNIQUE: Multidetector CT imaging of the abdomen and pelvis was performed following the standard protocol before and following the bolus administration of intravenous contrast.  RADIATION DOSE REDUCTION: This exam was  performed according to the departmental dose-optimization program which includes automated exposure control, adjustment of the mA and/or kV according to patient size and/or use of iterative reconstruction technique.  CONTRAST:  OMNIPAQUE IOHEXOL 300 MG/ML  SOLN  COMPARISON:  None Available.  FINDINGS: Lower chest: No acute abnormality. Cardiomegaly. Coronary artery calcifications.  Hepatobiliary: No solid liver abnormality is seen. Small, definitively benign cyst or hemangioma of the central liver dome for which no further follow-up or characterization is required (series 15, image 30). No gallstones, gallbladder wall thickening, or biliary dilatation.  Pancreas: Unremarkable. No pancreatic ductal dilatation or surrounding inflammatory changes.  Spleen: Normal in size without significant abnormality.  Adrenals/Urinary Tract: Adrenal glands are unremarkable. Small simple, benign bilateral renal cortical cysts, as well as intrinsically hyperdense, nonenhancing hemorrhagic or proteinaceous cysts of the midportions of the left and right kidneys (series 2, image 37). No urinary tract filling defect on delayed phase imaging. Bladder is unremarkable.  Stomach/Bowel: Stomach is within normal limits. Appendix appears normal. No evidence of bowel wall thickening, distention, or inflammatory changes. Descending and sigmoid diverticulosis.  Vascular/Lymphatic: Aortic atherosclerosis. 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 No enlarged abdominal or pelvic lymph nodes.  Reproductive: Prostate brachytherapy.  Other: Small, fat containing bilateral inguinal hernias. No ascites.  Musculoskeletal: No acute or  significant osseous findings.  IMPRESSION: 1. No evidence of urinary tract mass, calculus, or hydronephrosis. No urinary tract filling defect on delayed phase imaging. 2. Prostate brachytherapy. No evidence of mass or lymphadenopathy in the abdomen or pelvis. 3. Small simple, benign bilateral renal cortical cysts, as well as nonenhancing, benign hemorrhagic or proteinaceous cysts. No further follow-up or characterization is required for these benign renal cysts. 4. 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 Recommend referral to a vascular specialist. This recommendation follows ACR consensus guidelines: White Paper of the ACR Incidental Findings Committee II on Vascular Findings. J Am Coll Radiol 2013; 10:789-794. 5. Cardiomegaly and coronary artery disease.  Aortic Atherosclerosis (ICD10-I70.0).   Electronically Signed By: Jearld Lesch M.D. On: 08/19/2021 11:29  No results found for this or any previous visit.   Assessment & Plan:    1. Scrotal pain (Primary) resolved  2. Gross hematuria Continue avodart  3. Urinary incontinence We discussed indwelling foley, SP tube and intravesical botox and after discussing the options the patient elects for botox   No follow-ups on file.  Wilkie Aye, MD  [facility] [place]

## 2023-05-16 ENCOUNTER — Other Ambulatory Visit: Payer: Self-pay

## 2023-05-16 DIAGNOSIS — N3281 Overactive bladder: Secondary | ICD-10-CM

## 2023-05-21 ENCOUNTER — Encounter: Payer: Self-pay | Admitting: Urology

## 2023-05-21 NOTE — Patient Instructions (Signed)

## 2023-06-03 ENCOUNTER — Other Ambulatory Visit: Payer: Self-pay | Admitting: Urology

## 2023-06-03 ENCOUNTER — Telehealth: Payer: Self-pay

## 2023-06-03 ENCOUNTER — Other Ambulatory Visit: Payer: Medicare HMO

## 2023-06-03 DIAGNOSIS — R399 Unspecified symptoms and signs involving the genitourinary system: Secondary | ICD-10-CM | POA: Diagnosis not present

## 2023-06-03 DIAGNOSIS — R8290 Unspecified abnormal findings in urine: Secondary | ICD-10-CM

## 2023-06-03 DIAGNOSIS — R310 Gross hematuria: Secondary | ICD-10-CM

## 2023-06-03 LAB — MICROSCOPIC EXAMINATION

## 2023-06-03 LAB — URINALYSIS, ROUTINE W REFLEX MICROSCOPIC
Bilirubin, UA: NEGATIVE
Glucose, UA: NEGATIVE
Ketones, UA: NEGATIVE
Nitrite, UA: POSITIVE — AB
RBC, UA: NEGATIVE
Specific Gravity, UA: 1.015 (ref 1.005–1.030)
Urobilinogen, Ur: 0.2 mg/dL (ref 0.2–1.0)
pH, UA: 8.5 — ABNORMAL HIGH (ref 5.0–7.5)

## 2023-06-03 MED ORDER — CEPHALEXIN 500 MG PO CAPS: 500.0000 mg | ORAL_CAPSULE | Freq: Two times a day (BID) | ORAL | 0 refills | Status: AC

## 2023-06-03 NOTE — Telephone Encounter (Signed)
-----   Message from Donnita [place] sent at 06/03/2023  4:14 PM EST ----- Please let pt know I have sent prescription for Keflex for suspected UTI based on his reported symptoms and abnormal UA while awaiting urine culture results.

## 2023-06-03 NOTE — Addendum Note (Signed)
 Addended by: Christoper Fabian R on: 06/03/2023 04:47 PM   Modules accepted: Orders

## 2023-06-03 NOTE — Telephone Encounter (Signed)
Patient was made aware and verbalized understanding

## 2023-06-04 ENCOUNTER — Other Ambulatory Visit: Payer: Self-pay | Admitting: Vascular Surgery

## 2023-06-04 ENCOUNTER — Other Ambulatory Visit (HOSPITAL_COMMUNITY): Payer: Self-pay | Admitting: Cardiology

## 2023-06-04 ENCOUNTER — Encounter: Payer: Self-pay | Admitting: Vascular Surgery

## 2023-06-04 ENCOUNTER — Ambulatory Visit (INDEPENDENT_AMBULATORY_CARE_PROVIDER_SITE_OTHER)

## 2023-06-04 ENCOUNTER — Ambulatory Visit (INDEPENDENT_AMBULATORY_CARE_PROVIDER_SITE_OTHER): Payer: Medicare HMO | Admitting: Vascular Surgery

## 2023-06-04 ENCOUNTER — Ambulatory Visit (INDEPENDENT_AMBULATORY_CARE_PROVIDER_SITE_OTHER): Payer: Medicare HMO

## 2023-06-04 ENCOUNTER — Other Ambulatory Visit: Payer: Self-pay

## 2023-06-04 VITALS — BP 187/89 | HR 71 | Resp 22 | Ht 70.5 in | Wt 208.0 lb

## 2023-06-04 DIAGNOSIS — I6523 Occlusion and stenosis of bilateral carotid arteries: Secondary | ICD-10-CM

## 2023-06-04 DIAGNOSIS — I779 Disorder of arteries and arterioles, unspecified: Secondary | ICD-10-CM

## 2023-06-04 DIAGNOSIS — I739 Peripheral vascular disease, unspecified: Secondary | ICD-10-CM

## 2023-06-04 DIAGNOSIS — I7143 Infrarenal abdominal aortic aneurysm, without rupture: Secondary | ICD-10-CM

## 2023-06-04 DIAGNOSIS — I5032 Chronic diastolic (congestive) heart failure: Secondary | ICD-10-CM

## 2023-06-04 LAB — VAS US ABI WITH/WO TBI: Left ABI: 1.03

## 2023-06-04 NOTE — Progress Notes (Signed)
 Patient name: Troy [name] MRN: [id number] DOB: [birth year] Sex: male  REASON FOR CONSULT: 6 month follow-up, surveillance EVAR  HPI: Troy [name] is a 83 y.o. male, presents for 6 month follow-up for surveillance of EVAR.  He most recently underwent EVAR on 09/26/2022 for stent graft repair of a 7 cm saccular abdominal aortic aneurysm.  He did require right common femoral endarterectomy with bovine patch after failure of the Perclose devices.  States today his abdomen is doing fine without any significant pain.  He does complain of some trouble with his left leg and states it feels "off."  Also due for carotid surveillance and denies any recent stroke or TIA symptoms.   Past Medical History:  Diagnosis Date   A-fib [facility])    AAA (abdominal aortic aneurysm) (HCC)    Followed by Dr. Tawanna Cooler Early   Arthritis    CAD (coronary artery disease)    a. CABG 2000.   Cancer [facility])    Prostate:  Radiation Tx   Carotid artery disease (HCC)    Chest pain    precordial. mild chronic .[name]... nonischemic   CHF (congestive heart failure) (HCC)    Chronic edema    Coronary artery disease    a. Nuclear, January, 2008, no ischemia b. 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 Arbie Cookey   Sinus bradycardia    Asymptomatic    Past Surgical History:  Procedure Laterality Date   ABDOMINAL AORTIC ENDOVASCULAR STENT GRAFT N/A 09/26/2022   Procedure: ABDOMINAL  AORTIC ENDOVASCULAR STENT GRAFT;  Surgeon: Cephus Shelling, MD;  Location: [facility] OR;  Service: Vascular;  Laterality: N/A;   AORTOGRAM  09/26/2022   Procedure: AORTOGRAM;  Surgeon: Cephus Shelling, MD;  Location: [facility] OR;  Service: Vascular;;   BALLOON DILATION  03/14/2023   Procedure: [place];  Surgeon: Franky Macho, MD;  [facility]: [facility];  Service: Endoscopy;;   BIOPSY  03/14/2023   Procedure: BIOPSY;  Surgeon: Franky Macho, MD;  [facility]: [facility];  Service: Endoscopy;;   CARDIAC CATHETERIZATION  09/26/2012   1/4 patent bypass (occluded SVG-PDA, SVG-OM, LIMA-LAD), SVG-diagonal patent and fills the diagonal and LAD, distal RCA occlusion with left to right collateralization, patent circumflex, LAD with no flow-limiting disease and antegrade flow competitively from SVG-diagonal; EF 60-65%   COLONOSCOPY  11/30/2009   VHQ:[id number]. next TCS 11/2019   COLONOSCOPY WITH PROPOFOL N/A 12/18/2019   Procedure: COLONOSCOPY WITH PROPOFOL;  Surgeon: Dolores Frame, MD;  [facility]: [facility];  Service: Gastroenterology;  Laterality: N/A;  1030   COLONOSCOPY WITH PROPOFOL N/A 03/14/2023   Procedure: COLONOSCOPY WITH PROPOFOL;  Surgeon: Franky Macho, MD;  [facility]: [facility];  Service: Endoscopy;  Laterality: N/A;  12:45pm;asa 3   CORONARY ARTERY BYPASS GRAFT  2000  CYSTOSCOPY N/A 08/31/2021   Procedure: CYSTOSCOPY;  Surgeon: Malen Gauze, MD;  Location: [facility] ORS;  Service: Urology;  Laterality: N/A;  pt knows to arrive at 7:00   CYSTOSCOPY WITH FULGERATION N/A 03/22/2022   Procedure: CYSTOSCOPY WITH FULGERATION;  Surgeon: Malen Gauze, MD;  Location: [facility] ORS;  Service: Urology;  Laterality: N/A;   ENDARTERECTOMY FEMORAL Right 09/26/2022   Procedure: RIGHT COMMON FEMORAL ENDARTERECTOMY WITH BOVINE PATCH;  Surgeon: Cephus Shelling, MD;  Location: [facility] OR;  Service: Vascular;  Laterality: Right;   ESOPHAGOGASTRODUODENOSCOPY N/A  08/12/2013   ONG:[id number] peptic stricture with erosive refluxesophagitis - status post Maloney dilation. Hiatal hernia. Abnormalgastric mucosa. Deformity of the pyloric channel suggestive ofprior peptic ulcer disease. Duodenal bulbar diverticulum Statuspost gastric biopsy. h.pylori   ESOPHAGOGASTRODUODENOSCOPY (EGD) WITH PROPOFOL N/A 03/14/2023   Procedure: ESOPHAGOGASTRODUODENOSCOPY (EGD) WITH PROPOFOL;  Surgeon: Franky Macho, MD;  [facility];  Service: Endoscopy;  Laterality: N/A;  12:45pm;asa 3   LEFT HEART CATHETERIZATION WITH CORONARY/GRAFT ANGIOGRAM N/A 09/26/2012   Procedure: LEFT HEART CATHETERIZATION WITH Isabel Caprice;  Surgeon: Kathleene Hazel, MD;  Location: [facility] CATH LAB;  Service: Cardiovascular;  Laterality: N/A;   MALONEY DILATION N/A 08/12/2013   Procedure: Elease Hashimoto DILATION;  Surgeon: Corbin Ade, MD;  [facility];  Service: Endoscopy;  Laterality: N/A;   POLYPECTOMY  12/18/2019   Procedure: POLYPECTOMY;  Surgeon: Dolores Frame, MD;  [facility];  Service: Gastroenterology;;  ascending colon polyp    POLYPECTOMY  03/14/2023   Procedure: POLYPECTOMY INTESTINAL;  Surgeon: Franky Macho, MD;  [facility];  Service: Endoscopy;;   PR VEIN BYPASS GRAFT,AORTO-FEM-POP Right 07/19/1999   PR VEIN BYPASS GRAFT,AORTO-FEM-POP Left 05/02/2006   RIGHT HEART CATH AND CORONARY/GRAFT ANGIOGRAPHY N/A 04/11/2020   Procedure: RIGHT HEART CATH AND CORONARY/GRAFT ANGIOGRAPHY;  Surgeon: Laurey Morale, MD;  Location: [facility] INVASIVE CV LAB;  Service: Cardiovascular;  Laterality: N/A;   SAVORY DILATION N/A 08/12/2013   Procedure: SAVORY DILATION;  Surgeon: Corbin Ade, MD;  [facility];  Service: Endoscopy;  Laterality: N/A;   TRANSURETHRAL RESECTION OF BLADDER TUMOR N/A 08/31/2021   Procedure: TRANSURETHRAL RESECTION OF BLADDER TUMOR (TURBT);  Surgeon: Malen Gauze, MD;  Location: [facility];   Service: Urology;  Laterality: N/A;   ULTRASOUND GUIDANCE FOR VASCULAR ACCESS Bilateral 09/26/2022   Procedure: ULTRASOUND GUIDANCE FOR VASCULAR ACCESS;  Surgeon: Cephus Shelling, MD;  Location: [facility] OR;  Service: Vascular;  Laterality: Bilateral;   VASCULAR SURGERY     WRIST SURGERY     cyst removal    Family History  Problem Relation Age of Onset   Deep vein thrombosis Father    Lung cancer Sister    Diabetes Sister    Heart disease Sister        After age 4   Hyperlipidemia Sister    Hypertension Sister    Lung cancer Sister    Breast cancer Sister    Hypertension Mother    Diabetes Sister    Coronary artery disease Other        family hx of   Cancer Brother        "crab cancer"   Heart disease Brother    Heart attack Brother    Heart attack Daughter    Colon cancer Neg Hx    Stroke Neg Hx     SOCIAL HISTORY: Social History   Socioeconomic History   Marital status: Widowed  Spouse name: Not on file   Number of children: Not on file   Years of education: Not on file   Highest education level: Not on file  Occupational History   Not on file  Tobacco Use   Smoking status: Former    Current packs/day: 0.00    Average packs/day: 2.0 packs/day for 70.0 years (140.0 ttl pk-yrs)    Types: Cigarettes    Start date: 04/21/1948    Quit date: 04/21/2018    Years since quitting: 5.1   Smokeless tobacco: Never  Vaping Use   Vaping status: Never Used  Substance and Sexual Activity   Alcohol use: Yes    Alcohol/week: 0.0 - 2.0 standard drinks of alcohol    Comment: OCCASIONAL   Drug use: Never   Sexual activity: Not on file  Other Topics Concern   Not on file  Social History Narrative   Not on file   Social Drivers of Health   Financial Resource Strain: Not on file  Food Insecurity: No Food Insecurity (09/26/2022)   Hunger Vital Sign    Worried About Running Out of Food in the Last Year: Never true    Ran Out of Food in the Last Year: Never true   Transportation Needs: No Transportation Needs (09/26/2022)   PRAPARE - Administrator, Civil Service (Medical): No    Lack of Transportation (Non-Medical): No  Physical Activity: Not on file  Stress: Not on file  Social Connections: Unknown (10/18/2021)   Received from [facility]   Social Network    Social Network: Not on file  Intimate Partner Violence: Not At Risk (09/26/2022)   Humiliation, Afraid, Rape, and Kick questionnaire    Fear of Current or Ex-Partner: No    Emotionally Abused: No    Physically Abused: No    Sexually Abused: No    Allergies  Allergen Reactions   Niacin Itching and Rash    Burning sensation   Contrast Media [Iodinated Contrast Media] Nausea And Vomiting    Per patient vocal cords thick with increased phlegm    Gemtesa [Vibegron] Other (See Comments)    UTI    Iodine-131 Nausea And Vomiting   Jardiance [Empagliflozin] Other (See Comments)    UTI   Myrbetriq [Mirabegron] Other (See Comments)    UTI   Nsaids Other (See Comments)    Taking Coumadin    Current Outpatient Medications  Medication Sig Dispense Refill   acetaminophen (TYLENOL) 325 MG tablet Take 2 tablets (650 mg total) by mouth every 6 (six) hours as needed for mild pain, fever or headache (or Fever >/= 101). 12 tablet 0   albuterol (VENTOLIN HFA) 108 (90 Base) MCG/ACT inhaler Inhale 2 puffs into the lungs every 6 (six) hours as needed for wheezing or shortness of breath. 1 each 2   aspirin 81 MG EC tablet Take 1 tablet (81 mg total) by mouth daily with breakfast. 30 tablet 3   atorvastatin (LIPITOR) 40 MG tablet Take 1 tablet (40 mg total) by mouth daily. 90 tablet 1   cephALEXin (KEFLEX) 500 MG capsule Take 1 capsule (500 mg total) by mouth 2 (two) times daily for 7 days. 14 capsule 0   Cholecalciferol (VITAMIN D3) 50 MCG (2000 UT) capsule Take 2,000 Units by mouth daily.     dutasteride (AVODART) 0.5 MG capsule Take 1 capsule (0.5 mg total) by mouth daily.  30 capsule 11   HYDROcodone-acetaminophen (NORCO) 10-325 MG tablet Take 1 tablet  by mouth 2 (two) times daily as needed.     Incontinence Supplies (BARD [place] INCONTIN CLAMP) MISC 1 Units by Does not apply route daily. 1 each 0   ipratropium (ATROVENT) 0.03 % nasal spray Place 2 sprays into both nostrils 2 (two) times daily as needed for rhinitis.     isosorbide mononitrate (IMDUR) 60 MG 24 hr tablet Take 2 tablets (120 mg total) by mouth every evening. 180 tablet 3   latanoprost (XALATAN) 0.005 % ophthalmic solution Place 1 drop into both eyes at bedtime.     levocetirizine (XYZAL) 5 MG tablet Take 5 mg by mouth every evening.     loratadine (CLARITIN) 10 MG tablet Take 10 mg by mouth daily.     losartan (COZAAR) 50 MG tablet Take 1 tablet (50 mg total) by mouth daily. 30 tablet 3   metFORMIN (GLUCOPHAGE) 500 MG tablet Take 500 mg by mouth 2 (two) times daily with a meal.     nitrofurantoin (MACRODANTIN) 50 MG capsule Take 1 capsule (50 mg total) by mouth at bedtime. 30 capsule 5   nitroGLYCERIN (NITROSTAT) 0.4 MG SL tablet Place 1 tablet (0.4 mg total) under the tongue every 5 (five) minutes x 3 doses as needed for chest pain. 25 tablet 3   oxyCODONE-acetaminophen (PERCOCET) 5-325 MG tablet Take 1 tablet by mouth every 6 (six) hours as needed for severe pain. 30 tablet 0   OXYGEN Inhale 3 L into the lungs at bedtime.     pantoprazole (PROTONIX) 40 MG tablet Take 1 tablet (40 mg total) by mouth daily. Take 30-60 min before first meal of the day     polyethylene glycol powder (GLYCOLAX/MIRALAX) 17 GM/SCOOP powder Take 8.5 g by mouth daily. (Patient taking differently: Take 8.5 g by mouth daily. As needed)     spironolactone (ALDACTONE) 25 MG tablet Take 1 tablet (25 mg total) by mouth daily. 90 tablet 3   torsemide (DEMADEX) 10 MG tablet Take 1 tablet (10 mg total) by mouth every 3 (three) days.     triamcinolone cream (KENALOG) 0.1 % Apply 1 Application topically 2 (two) times daily.      warfarin (COUMADIN) 2 MG tablet Take 2 mg by mouth daily.     No current facility-administered medications for this visit.    REVIEW OF SYSTEMS:  [X]  denotes positive finding, [ ]  denotes negative finding Cardiac  Comments:  Chest pain or chest pressure:    Shortness of breath upon exertion:    Short of breath when lying flat:    Irregular heart rhythm:        Vascular    Pain in calf, thigh, or hip brought on by ambulation:    Pain in feet at night that wakes you up from your sleep:     Blood clot in your veins:    Leg swelling:         Pulmonary    Oxygen at home:    Productive cough:     Wheezing:         Neurologic    Sudden weakness in arms or legs:     Sudden numbness in arms or legs:     Sudden onset of difficulty speaking or slurred speech:    Temporary loss of vision in one eye:     Problems with dizziness:         Gastrointestinal    Blood in stool:     Vomited blood:  Genitourinary    Burning when urinating:     Blood in urine:        Psychiatric    Major depression:         Hematologic    Bleeding problems:    Problems with blood clotting too easily:        Skin    Rashes or ulcers:        Constitutional    Fever or chills:      PHYSICAL EXAM: There were no vitals filed for this visit.  GENERAL: The patient is a well-nourished male, in no acute distress. The vital signs are documented above. CARDIAC: There is a regular rate and rhythm.  VASCULAR:  Right groin incision well-healed Bilateral femoral pulses palpable Left DP palpable No lower extremity tissue loss. PULMONARY: No respiratory distress. ABDOMEN: Soft and non-tender. MUSCULOSKELETAL: There are no major deformities or cyanosis. NEUROLOGIC: No focal weakness or paresthesias are detected. SKIN: There are no ulcers or rashes noted. PSYCHIATRIC: The patient has a normal affect.  DATA:   ABI today noncompressible on the right and 1.03 triphasic on the left  Carotid duplex  today shows 1 to 39% right ICA stenosis and 40 to 59% left ICA stenosis  EVAR duplex today shows patent endovascular aneurysm repair with no endoleak  CTA reviewed from 10/26/2022 with good position of the aortic stent graft and exclusion of the aneurysm with no endoleak.  Right common femoral endarterectomy site widely patent.  Assessment/Plan:  83 y.o. male, presents for 6 month follow-up for surveillance of EVAR.  He most recently underwent EVAR on 09/26/2022 for stent graft repair of a 7 cm saccular abdominal aortic aneurysm.  He did require right common femoral  endarterectomy with bovine patch after failure of the Perclose devices.  Discussed duplex shows the stent graft is widely patent.  No evidence of endoleak.  Will repeat EVAR duplex in 1 year.  He does complain of some left leg symptoms but he has a palpable pulse with normal ABIs.  He states the leg feels off but I do not think this is related to arterial insufficiency.  Also has asymptomatic carotid disease with a 1 to 39% right 40 to 59% left ICA stenosis.  Discussed no indication for carotid intervention.  I discussed for asymptomatic carotid disease recommend surgery for greater than 80% stenosis.  We will continue to follow.  Discussed we will follow-up in 1 year with repeat carotid duplex.   Cephus Shelling, MD [facility] Office: [phone]

## 2023-06-06 LAB — URINE CULTURE

## 2023-06-07 NOTE — Patient Instructions (Signed)
 Troy [name]  06/07/2023     @PREFPERIOPPHARMACY @   Your procedure is scheduled on  06/13/2023.   Report to [facility] at  [phone] A.M.   Call this number if you have problems the morning of surgery:  [phone]  If you experience any cold or flu symptoms such as cough, fever, chills, shortness of breath, etc. between now and your scheduled surgery, please notify us at the above number.   Remember:  Do not eat after midnight.   You may drink clear liquids until 0430 am on 06/13/2023.    Clear liquids allowed are:                    Water, Juice (No red color; non-citric and without pulp; diabetics please choose diet or no sugar options), Carbonated beverages (diabetics please choose diet or no sugar options), Clear Tea (No creamer, milk, or cream, including half & half and powdered creamer), Black Coffee Only (No creamer, milk or cream, including half & half and powdered creamer), and Clear Sports drink (No red color; diabetics please choose diet or no sugar options)    Take these medicines the morning of surgery with A SIP OF WATER                   hydrocodone (if needed), pantoprazole.    Do not wear jewelry, make-up or nail polish, including gel polish,  artificial nails, or any other type of covering on natural nails (fingers and  toes).  Do not wear lotions, powders, or perfumes, or deodorant.  Do not shave 48 hours prior to surgery.  Men may shave face and neck.  Do not bring valuables to the hospital.  [facility] is not responsible for any belongings or valuables.  Contacts, dentures or bridgework may not be worn into surgery.  Leave your suitcase in the car.  After surgery it may be brought to your room.  For patients admitted to the hospital, discharge time will be determined by your treatment team.  Patients discharged the day of surgery will not be allowed to drive home and must have someone with them for 24 hours.    Special instructions:   DO NOT  smoke tobacco or vape for 24 hours before your procedure.  Please read over the following fact sheets that you were given. Anesthesia Post-op Instructions and Care and Recovery After Surgery       Botulinum Toxin Bladder Injection  A botulinum toxin bladder injection is a procedure to treat an overactive bladder. During the procedure, a drug called botulinum toxin is injected into the bladder through a long, thin needle. This drug relaxes the bladder muscles and reduces overactivity. You may need this procedure if your medicines are not working or you cannot take them. The procedure may be repeated as needed. The treatment is done once and it usually lasts for 6 months. Your health care provider will monitor you to see how well you respond. Tell a health care provider about: Any allergies you have. All medicines you are taking, including vitamins, herbs, eye drops, creams, and over-the-counter medicines. Any problems you or family members have had with anesthetic medicines. Any bleeding problems you have. Any surgeries you have had. Any medical conditions you have. Any previous reactions to a botulinum toxin injection. Any symptoms of urinary tract infection. These include chills, fever, a burning feeling when passing urine, and needing to pass urine often. Whether  you are pregnant or may be pregnant. What are the risks? Generally this is a safe procedure. However, problems may occur, including: Not being able to pass urine. If this happens, you may need to have your bladder emptied with a thin tube (urinary catheter). Bleeding. Urinary tract infection. Allergic reaction to the botulinum toxin. Pain or burning when passing urine. Damage to nearby structures or organs. What happens before the procedure? When to stop eating and drinking Follow instructions from your health care provider about what you may eat and drink before your procedure. These may include: 8 hours before the  procedure Stop eating most foods. Do not eat meat, fried foods, or fatty foods. Eat only light foods, such as toast or crackers. All liquids are okay except energy drinks and alcohol. 6 hours before the procedure Stop eating. Drink only clear liquids, such as water, clear fruit juice, black coffee, plain tea, and sports drinks. Do not drink energy drinks or alcohol. 2 hours before the procedure Stop drinking all liquids. You may be allowed to take medicines with small sips of water. If you do not follow your health care provider's instructions, your procedure may be delayed or canceled. Medicines Ask your health care provider about: Changing or stopping your regular medicines. This is especially important if you are taking diabetes medicines or blood thinners. Taking medicines such as aspirin and ibuprofen. These medicines can thin your blood. Do not take these medicines unless your health care provider tells you to take them. Taking over-the-counter medicines, vitamins, herbs, and supplements. General instructions Ask your health care provider what steps will be taken to help prevent infection. These steps may include: Removing hair at the procedure site. Washing skin with a germ-killing soap. Taking antibiotic medicine. If you will be going home right after the procedure, plan to have a responsible adult: Take you home from the hospital or clinic. You will not be allowed to drive. Care for you for the time you are told. What happens during the procedure?  You will be asked to empty your bladder. An IV will be inserted into one of your veins. You will be given one or more of the following: A medicine to help you relax (sedative). A medicine to numb the area (local anesthetic). A medicine to make you fall asleep (general anesthetic). A long, thin scope called a cystoscope will be passed into your bladder through the part of the body that carries urine from your bladder  (urethra). The cystoscope will be used to fill your bladder with water. A long needle will be passed through the cystoscope and into the bladder. The botulinum toxin will be injected into your bladder. It may be injected into multiple areas of your bladder. The cystoscope will be removed and your bladder will be emptied with a urinary catheter. The procedure may vary among health care providers and hospitals. What can I expect after the procedure? After your procedure, it is common to have: Blood-tinged urine. Burning or soreness when you pass urine. Follow these instructions at home: Medicines Take over-the-counter and prescription medicines only as told by your health care provider. If you were prescribed an antibiotic medicine, take it as told by your health care provider. Do not stop using the antibiotic even if you start to feel better. General instructions  If you were given a sedative during the procedure, it can affect you for several hours. Do not drive or operate machinery until your health care provider says that it is safe.  Drink enough fluid to keep your urine pale yellow. Return to your normal activities as told by your health care provider. Ask your health care provider what activities are safe for you. Keep all follow-up visits. Contact a health care provider if you have: A fever or chills. Blood-tinged urine for more than one day after your procedure. Worsening pain or burning when you pass urine. Pain or burning when passing urine for more than two days after your procedure. Trouble emptying your bladder. Get help right away if you: Have bright red blood in your urine. Are unable to pass urine. Summary A botulinum toxin bladder injection is a procedure to treat an overactive bladder. This is generally a safe procedure. However, problems may occur, including not being able to pass urine, bleeding, infection, pain, and an allergic reaction to the botulinum toxin. You  will be told when to stop eating and drinking, and what medicines to change or stop. Follow instructions carefully. After the procedure, it is common to have blood in your urine and to have soreness or burning when passing urine. Contact a health care provider if you have a fever, blood in your urine for more than a few days, or trouble passing urine. Get help right away if you have bright red blood in your urine, or if you are unable to pass urine. This information is not intended to replace advice given to you by your health care provider. Make sure you discuss any questions you have with your health care provider. Document Revised: 09/23/2020 Document Reviewed: 09/23/2020 Elsevier Patient Education  2024 [employer]General Anesthesia, Adult, Care After The following information offers guidance on how to care for yourself after your procedure. Your health care provider may also give you more specific instructions. If you have problems or questions, contact your health care provider. What can I expect after the procedure? After the procedure, it is common for people to: Have pain or discomfort at the IV site. Have nausea or vomiting. Have a sore throat or hoarseness. Have trouble concentrating. Feel cold or chills. Feel weak, sleepy, or tired (fatigue). Have soreness and body aches. These can affect parts of the body that were not involved in surgery. Follow these instructions at home: For the time period you were told by your health care provider:  Rest. Do not participate in activities where you could fall or become injured. Do not drive or use machinery. Do not drink alcohol. Do not take sleeping pills or medicines that cause drowsiness. Do not make important decisions or sign legal documents. Do not take care of children on your own. General instructions Drink enough fluid to keep your urine pale yellow. If you have sleep apnea, surgery and certain medicines can increase your risk  for breathing problems. Follow instructions from your health care provider about wearing your sleep device: Anytime you are sleeping, including during daytime naps. While taking prescription pain medicines, sleeping medicines, or medicines that make you drowsy. Return to your normal activities as told by your health care provider. Ask your health care provider what activities are safe for you. Take over-the-counter and prescription medicines only as told by your health care provider. Do not use any products that contain nicotine or tobacco. These products include cigarettes, chewing tobacco, and vaping devices, such as e-cigarettes. These can delay incision healing after surgery. If you need help quitting, ask your health care provider. Contact a health care provider if: You have nausea or vomiting that does not get better with  medicine. You vomit every time you eat or drink. You have pain that does not get better with medicine. You cannot urinate or have bloody urine. You develop a skin rash. You have a fever. Get help right away if: You have trouble breathing. You have chest pain. You vomit blood. These symptoms may be an emergency. Get help right away. Call 911. Do not wait to see if the symptoms will go away. Do not drive yourself to the hospital. Summary After the procedure, it is common to have a sore throat, hoarseness, nausea, vomiting, or to feel weak, sleepy, or fatigue. For the time period you were told by your health care provider, do not drive or use machinery. Get help right away if you have difficulty breathing, have chest pain, or vomit blood. These symptoms may be an emergency. This information is not intended to replace advice given to you by your health care provider. Make sure you discuss any questions you have with your health care provider. Document Revised: 06/16/2021 Document Reviewed: 06/16/2021 Elsevier Patient Education  2024 [employer].How to Use Chlorhexidine  at Home in the Shower Chlorhexidine gluconate (CHG) is a germ-killing (antiseptic) wash that's used to clean the skin. It can get rid of the germs that normally live on the skin and can keep them away for about 24 hours. If you're having surgery, you may be told to shower with CHG at home the night before surgery. This can help lower your risk for infection. To use CHG wash in the shower, follow the steps below. Supplies needed: CHG body wash. Clean washcloth. Clean towel. How to use CHG in the shower Follow these steps unless you're told to use CHG in a different way: Start the shower. Use your normal soap and shampoo to wash your face and hair. Turn off the shower or move out of the shower stream. Pour CHG onto a clean washcloth. Do not use any type of brush or rough sponge. Start at your neck, washing your body down to your toes. Make sure you: Wash the part of your body where the surgery will be done for at least 1 minute. Do not scrub. Do not use CHG on your head or face unless your health care provider tells you to. If it gets into your ears or eyes, rinse them well with water. Do not wash your genitals with CHG. Wash your back and under your arms. Make sure to wash skin folds. Let the CHG sit on your skin for 1-2 minutes or as long as told. Rinse your entire body in the shower, including all body creases and folds. Turn off the shower. Dry off with a clean towel. Do not put anything on your skin afterward, such as powder, lotion, or perfume. Put on clean clothes or pajamas. If it's the night before surgery, sleep in clean sheets. General tips Use CHG only as told, and follow the instructions on the label. Use the full amount of CHG as told. This is often one bottle. Do not smoke and stay away from flames after using CHG. Your skin may feel sticky after using CHG. This is normal. The sticky feeling will go away as the CHG dries. Do not use CHG: If you have a chlorhexidine  allergy or have reacted to chlorhexidine in the past. On open wounds or areas of skin that have broken skin, cuts, or scrapes. On babies younger than 65 months of age. Contact a health care provider if: You have questions about using  CHG. Your skin gets irritated or itchy. You have a rash after using CHG. You swallow any CHG. Call your local poison control center [phone] in the [place] Your eyes itch badly, or they become very red or swollen. Your hearing changes. You have trouble seeing. If you can't reach your provider, go to an urgent care or emergency room. Do not drive yourself. Get help right away if: You have swelling or tingling in your mouth or throat. You make high-pitched whistling sounds when you breathe, most often when you breathe out (wheeze). You have trouble breathing. These symptoms may be an emergency. Call 911 right away. Do not wait to see if the symptoms will go away. Do not drive yourself to the hospital. This information is not intended to replace advice given to you by your health care provider. Make sure you discuss any questions you have with your health care provider. Document Revised: 10/02/2022 Document Reviewed: 09/28/2021 Elsevier Patient Education  2024 [employer].

## 2023-06-10 ENCOUNTER — Telehealth: Payer: Self-pay | Admitting: Urology

## 2023-06-10 ENCOUNTER — Other Ambulatory Visit: Payer: Self-pay

## 2023-06-10 ENCOUNTER — Encounter (HOSPITAL_COMMUNITY): Payer: Self-pay

## 2023-06-10 ENCOUNTER — Encounter (HOSPITAL_COMMUNITY)
Admission: RE | Admit: 2023-06-10 | Discharge: 2023-06-10 | Disposition: A | Discharge status: Home / Self Care | Payer: Medicare HMO | Source: Ambulatory Visit | Attending: Urology | Admitting: Urology

## 2023-06-10 VITALS — BP 185/88 | HR 71 | Temp 98.0°F | Resp 18 | Ht 70.5 in | Wt 208.0 lb

## 2023-06-10 DIAGNOSIS — Z01812 Encounter for preprocedural laboratory examination: Secondary | ICD-10-CM | POA: Diagnosis present

## 2023-06-10 DIAGNOSIS — E119 Type 2 diabetes mellitus without complications: Secondary | ICD-10-CM | POA: Diagnosis not present

## 2023-06-10 LAB — BASIC METABOLIC PANEL
Anion gap: 12 (ref 5–15)
BUN: 18 mg/dL (ref 8–23)
CO2: 20 mmol/L — ABNORMAL LOW (ref 22–32)
Calcium: 9 mg/dL (ref 8.9–10.3)
Chloride: 106 mmol/L (ref 98–111)
Creatinine, Ser: 0.96 mg/dL (ref 0.61–1.24)
GFR, Estimated: >60 mL/min (ref >60)
Glucose, Bld: 94 mg/dL (ref 70–99)
Potassium: 4.5 mmol/L (ref 3.5–5.1)
Sodium: 138 mmol/L (ref 135–145)

## 2023-06-10 LAB — HEMOGLOBIN A1C
Hgb A1c MFr Bld: 6.8 % — ABNORMAL HIGH (ref 4.8–5.6)
Mean Plasma Glucose: 148.46 mg/dL

## 2023-06-10 MED ORDER — ONABOTULINUMTOXINA 100 UNITS IJ SOLR: 100.0000 [IU] | Freq: Once | INTRAMUSCULAR | Status: DC

## 2023-06-10 NOTE — Telephone Encounter (Signed)
 Culture sent to MD, waiting for response to results.

## 2023-06-10 NOTE — Telephone Encounter (Signed)
 Patient called he wants to know what is urine culture results were and what are the next steps?

## 2023-06-11 ENCOUNTER — Other Ambulatory Visit (HOSPITAL_COMMUNITY): Payer: Self-pay

## 2023-06-11 DIAGNOSIS — I5032 Chronic diastolic (congestive) heart failure: Secondary | ICD-10-CM

## 2023-06-11 MED ORDER — LOSARTAN POTASSIUM 50 MG PO TABS: 50.0000 mg | ORAL_TABLET | Freq: Every day | ORAL | 1 refills | Status: DC

## 2023-06-11 NOTE — Telephone Encounter (Signed)
 Patient called and made aware of negative urine culture.

## 2023-06-13 ENCOUNTER — Ambulatory Visit (HOSPITAL_BASED_OUTPATIENT_CLINIC_OR_DEPARTMENT_OTHER): Admitting: Anesthesiology

## 2023-06-13 ENCOUNTER — Ambulatory Visit (HOSPITAL_COMMUNITY): Admitting: Anesthesiology

## 2023-06-13 ENCOUNTER — Encounter (HOSPITAL_COMMUNITY)
Admission: RE | Disposition: A | Discharge status: Home / Self Care | Payer: Self-pay | Source: Home / Self Care | Attending: Urology

## 2023-06-13 ENCOUNTER — Encounter (HOSPITAL_COMMUNITY): Payer: Self-pay | Admitting: Urology

## 2023-06-13 ENCOUNTER — Ambulatory Visit (HOSPITAL_COMMUNITY)
Admission: RE | Admit: 2023-06-13 | Discharge: 2023-06-13 | Disposition: A | Discharge status: Home / Self Care | Payer: Medicare HMO | Attending: Urology | Admitting: Urology

## 2023-06-13 DIAGNOSIS — I110 Hypertensive heart disease with heart failure: Secondary | ICD-10-CM | POA: Diagnosis not present

## 2023-06-13 DIAGNOSIS — Z87891 Personal history of nicotine dependence: Secondary | ICD-10-CM

## 2023-06-13 DIAGNOSIS — Z7984 Long term (current) use of oral hypoglycemic drugs: Secondary | ICD-10-CM | POA: Diagnosis not present

## 2023-06-13 DIAGNOSIS — I509 Heart failure, unspecified: Secondary | ICD-10-CM | POA: Insufficient documentation

## 2023-06-13 DIAGNOSIS — Z8546 Personal history of malignant neoplasm of prostate: Secondary | ICD-10-CM | POA: Diagnosis not present

## 2023-06-13 DIAGNOSIS — Z923 Personal history of irradiation: Secondary | ICD-10-CM | POA: Diagnosis not present

## 2023-06-13 DIAGNOSIS — N3281 Overactive bladder: Secondary | ICD-10-CM

## 2023-06-13 DIAGNOSIS — Z951 Presence of aortocoronary bypass graft: Secondary | ICD-10-CM | POA: Diagnosis not present

## 2023-06-13 DIAGNOSIS — I2510 Atherosclerotic heart disease of native coronary artery without angina pectoris: Secondary | ICD-10-CM

## 2023-06-13 DIAGNOSIS — I10 Essential (primary) hypertension: Secondary | ICD-10-CM

## 2023-06-13 DIAGNOSIS — I4891 Unspecified atrial fibrillation: Secondary | ICD-10-CM | POA: Diagnosis not present

## 2023-06-13 DIAGNOSIS — K219 Gastro-esophageal reflux disease without esophagitis: Secondary | ICD-10-CM | POA: Insufficient documentation

## 2023-06-13 DIAGNOSIS — Z7901 Long term (current) use of anticoagulants: Secondary | ICD-10-CM | POA: Insufficient documentation

## 2023-06-13 DIAGNOSIS — I252 Old myocardial infarction: Secondary | ICD-10-CM | POA: Diagnosis not present

## 2023-06-13 DIAGNOSIS — E119 Type 2 diabetes mellitus without complications: Secondary | ICD-10-CM | POA: Diagnosis not present

## 2023-06-13 DIAGNOSIS — I700 Atherosclerosis of aorta: Secondary | ICD-10-CM | POA: Insufficient documentation

## 2023-06-13 HISTORY — PX: CYSTOSCOPY WITH INJECTION: SHX1424

## 2023-06-13 LAB — GLUCOSE, CAPILLARY
Glucose-Capillary: 93 mg/dL (ref 70–99)
Glucose-Capillary: 111 mg/dL — ABNORMAL HIGH (ref 70–99)

## 2023-06-13 SURGERY — CYSTOSCOPY, WITH INJECTION OF BLADDER NECK OR BLADDER WALL
Anesthesia: General | Site: Bladder

## 2023-06-13 MED ORDER — SODIUM CHLORIDE 0.9 % IR SOLN
Status: DC | PRN
Start: 2023-06-13 — End: 2023-06-13
  Administered 2023-06-13: 3000 mL

## 2023-06-13 MED ORDER — FENTANYL CITRATE PF 50 MCG/ML IJ SOSY: 25.0000 ug | PREFILLED_SYRINGE | INTRAMUSCULAR | Status: DC | PRN

## 2023-06-13 MED ORDER — CEFAZOLIN SODIUM-DEXTROSE 2-4 GM/100ML-% IV SOLN
2.0000 g | INTRAVENOUS | Status: AC
  Administered 2023-06-13: 2 g via INTRAVENOUS

## 2023-06-13 MED ORDER — SODIUM CHLORIDE (PF) 0.9 % IJ SOLN
INTRAMUSCULAR | Status: DC | PRN
  Administered 2023-06-13: 20 mL

## 2023-06-13 MED ORDER — ONDANSETRON HCL 4 MG/2ML IJ SOLN
INTRAMUSCULAR | Status: AC
  Filled 2023-06-13: qty 2

## 2023-06-13 MED ORDER — ONDANSETRON HCL 4 MG/2ML IJ SOLN: 4.0000 mg | Freq: Once | INTRAMUSCULAR | Status: DC | PRN

## 2023-06-13 MED ORDER — WATER FOR IRRIGATION, STERILE IR SOLN
Status: DC | PRN
  Administered 2023-06-13: 500 mL

## 2023-06-13 MED ORDER — GLYCOPYRROLATE PF 0.2 MG/ML IJ SOSY
PREFILLED_SYRINGE | INTRAMUSCULAR | Status: DC | PRN
  Administered 2023-06-13: .2 mg via INTRAVENOUS

## 2023-06-13 MED ORDER — ORAL CARE MOUTH RINSE: 15.0000 mL | Freq: Once | OROMUCOSAL | Status: AC

## 2023-06-13 MED ORDER — SEVOFLURANE IN SOLN
RESPIRATORY_TRACT | Status: AC
  Filled 2023-06-13: qty 250

## 2023-06-13 MED ORDER — ONABOTULINUMTOXINA 100 UNITS IJ SOLR
INTRAMUSCULAR | Status: DC | PRN
  Administered 2023-06-13: 100 [IU] via INTRAMUSCULAR

## 2023-06-13 MED ORDER — LIDOCAINE HCL (PF) 2 % IJ SOLN
INTRAMUSCULAR | Status: AC
  Filled 2023-06-13: qty 5

## 2023-06-13 MED ORDER — CEFAZOLIN SODIUM-DEXTROSE 2-4 GM/100ML-% IV SOLN
INTRAVENOUS | Status: AC
  Filled 2023-06-13: qty 100

## 2023-06-13 MED ORDER — DEXAMETHASONE SODIUM PHOSPHATE 10 MG/ML IJ SOLN
INTRAMUSCULAR | Status: DC | PRN
  Administered 2023-06-13: 10 mg via INTRAVENOUS

## 2023-06-13 MED ORDER — PROPOFOL 10 MG/ML IV BOLUS
INTRAVENOUS | Status: AC
  Filled 2023-06-13: qty 20

## 2023-06-13 MED ORDER — LACTATED RINGERS IV SOLN: INTRAVENOUS | Status: DC

## 2023-06-13 MED ORDER — SODIUM CHLORIDE (PF) 0.9 % IJ SOLN
INTRAMUSCULAR | Status: AC
  Filled 2023-06-13: qty 20

## 2023-06-13 MED ORDER — LIDOCAINE HCL (PF) 2 % IJ SOLN
INTRAMUSCULAR | Status: DC | PRN
  Administered 2023-06-13: 60 mg

## 2023-06-13 MED ORDER — SODIUM CHLORIDE (PF) 0.9 % IJ SOLN
INTRAMUSCULAR | Status: AC
  Filled 2023-06-13: qty 10

## 2023-06-13 MED ORDER — OXYCODONE HCL 5 MG/5ML PO SOLN: 5.0000 mg | Freq: Once | ORAL | Status: DC | PRN

## 2023-06-13 MED ORDER — CHLORHEXIDINE GLUCONATE 0.12 % MT SOLN
15.0000 mL | Freq: Once | OROMUCOSAL | Status: AC
  Administered 2023-06-13: 15 mL via OROMUCOSAL

## 2023-06-13 MED ORDER — ONABOTULINUMTOXINA 100 UNITS IJ SOLR
100.0000 [IU] | Freq: Once | INTRAMUSCULAR | Status: DC
  Filled 2023-06-13: qty 100

## 2023-06-13 MED ORDER — ONDANSETRON HCL 4 MG/2ML IJ SOLN
INTRAMUSCULAR | Status: DC | PRN
  Administered 2023-06-13: 4 mg via INTRAVENOUS

## 2023-06-13 MED ORDER — OXYCODONE HCL 5 MG PO TABS: 5.0000 mg | ORAL_TABLET | Freq: Once | ORAL | Status: DC | PRN

## 2023-06-13 MED ORDER — GLYCOPYRROLATE PF 0.2 MG/ML IJ SOSY
PREFILLED_SYRINGE | INTRAMUSCULAR | Status: AC
  Filled 2023-06-13: qty 1

## 2023-06-13 MED ORDER — PROPOFOL 10 MG/ML IV BOLUS
INTRAVENOUS | Status: DC | PRN
  Administered 2023-06-13: 60 mg via INTRAVENOUS

## 2023-06-13 SURGICAL SUPPLY — 21 items
BAG DRAIN URO TABLE W/ADPT NS (BAG) ×1 IMPLANT
BAG HAMPER (MISCELLANEOUS) ×1 IMPLANT
CLOTH BEACON ORANGE TIMEOUT ST (SAFETY) ×1 IMPLANT
GLOVE BIO SURGEON STRL SZ8 (GLOVE) ×1 IMPLANT
GLOVE BIOGEL PI IND STRL 7.0 (GLOVE) ×2 IMPLANT
GOWN STRL REUS W/TWL LRG LVL3 (GOWN DISPOSABLE) ×1 IMPLANT
GOWN STRL REUS W/TWL XL LVL3 (GOWN DISPOSABLE) ×1 IMPLANT
KIT TURNOVER CYSTO (KITS) ×1 IMPLANT
MANIFOLD NEPTUNE II (INSTRUMENTS) ×1 IMPLANT
NDL ASPIRATION 22 (NEEDLE) ×1 IMPLANT
NDL HYPO 18GX1.5 BLUNT FILL (NEEDLE) ×1 IMPLANT
NEEDLE ASPIRATION 22 (NEEDLE) ×1 IMPLANT
NEEDLE HYPO 18GX1.5 BLUNT FILL (NEEDLE) ×1 IMPLANT
PACK CYSTO (CUSTOM PROCEDURE TRAY) ×1 IMPLANT
PAD ARMBOARD POSITIONER FOAM (MISCELLANEOUS) ×1 IMPLANT
POSITIONER HEAD 8X9X4 ADT (SOFTGOODS) ×1 IMPLANT
SYR 30ML LL (SYRINGE) ×1 IMPLANT
SYR CONTROL 10ML LL (SYRINGE) ×1 IMPLANT
TOWEL OR 17X26 4PK STRL BLUE (TOWEL DISPOSABLE) ×1 IMPLANT
WATER STERILE IRR 3000ML UROMA (IV SOLUTION) ×1 IMPLANT
WATER STERILE IRR 500ML POUR (IV SOLUTION) ×1 IMPLANT

## 2023-06-13 NOTE — Op Note (Signed)
 Preoperative diagnosis: Overactive bladder   Postoperative diagnosis: same   Procedure: 1 cystoscopy 2. Intravesical botox injection 100 units   Attending: Wilkie Aye   Anesthesia: General   Estimated blood loss: Minimal   Drains: none   Specimens: none   Antibiotics: ancef   Findings:  Ureteral orifices in normal anatomic location.  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  After discussing treatment options, they decided proceed with intravesical botox injection.   Procedure in detail: The patient was brought to the operating room and a brief timeout was done to ensure correct patient, correct procedure, correct site.  General anesthesia was administered patient was placed in dorsal lithotomy position.  Their genitalia was then prepped and draped in usual sterile fashion.  A rigid 22 French cystoscope was passed in the urethra and the bladder.  Bladder was inspected and we noted no masses or lesions.  the ureteral orifices were in the normal orthotopic locations. Using a deflux injection needle we proceed to inject 100 units in a grid pattern between the ureteral orifices starting at the trigone to the mid posterior wall. We injected a total of 20 sites with 5 units per injection. The bladder was then drained and this concluded the procedure which was well tolerated by patient.   Complications: None   Condition: Stable, extubated, transferred to PACU   Plan: Patient is to be discharge home. They will followup in 1 month

## 2023-06-13 NOTE — Anesthesia Procedure Notes (Signed)
 Procedure Name: LMA Insertion Date/Time: 06/13/2023 11:01 AM  Performed by: Izola Price., CRNAPre-anesthesia Checklist: Patient identified, Emergency Drugs available, Suction available and Patient being monitored Patient Re-evaluated:Patient Re-evaluated prior to induction Oxygen Delivery Method: Circle System Utilized Preoxygenation: Pre-oxygenation with 100% oxygen Induction Type: IV induction Ventilation: Mask ventilation without difficulty LMA: LMA with gastric port inserted LMA Size: 5.0 Number of attempts: 1 Airway Equipment and Method: Bite block Placement Confirmation: positive ETCO2 Tube secured with: Tape Dental Injury: Teeth and Oropharynx as per pre-operative assessment

## 2023-06-13 NOTE — Interval H&P Note (Signed)
 History and Physical Interval Note:  06/13/2023 10:21 AM  Troy [name]  has presented today for surgery, with the diagnosis of overactive bladder.  The various methods of treatment have been discussed with the patient and family. After consideration of risks, benefits and other options for treatment, the patient has consented to  Procedure(s) with comments: CYSTOSCOPY WITH INJECTION- Botox 100 units (N/A) - pt will arrive between 8:30 - 9:00 as a surgical intervention.  The patient's history has been reviewed, patient examined, no change in status, stable for surgery.  I have reviewed the patient's chart and labs.  Questions were answered to the patient's satisfaction.     Wilkie Aye

## 2023-06-13 NOTE — Transfer of Care (Signed)
 Immediate Anesthesia Transfer of Care Note  Patient: Troy [name]  Procedure(s) Performed: CYSTOSCOPY WITH INJECTION- Botox 100 units (Bladder)  Patient Location: PACU  Anesthesia Type:General  Level of Consciousness: awake  Airway & Oxygen Therapy: Patient Spontanous Breathing and Patient connected to face mask oxygen  Post-op Assessment: Report given to RN and Post -op Vital signs reviewed and stable  Post vital signs: Reviewed and stable  Last Vitals:  Vitals Value Taken Time  BP 139/75 06/13/23 1115  Temp    Pulse 60 06/13/23 1119  Resp 13 06/13/23 1119  SpO2 95 % 06/13/23 1119  Vitals shown include unfiled device data.  Last Pain:  Vitals:   06/13/23 0926  PainSc: 0-No pain         Complications: No notable events documented.

## 2023-06-13 NOTE — Anesthesia Preprocedure Evaluation (Signed)
 Anesthesia Evaluation  Patient identified by MRN, date of birth, ID band Patient awake    Reviewed: Allergy & Precautions, H&P , NPO status , Patient's Chart, lab work & pertinent test results, reviewed documented beta blocker date and time   Airway Mallampati: II  TM Distance: >3 FB Neck ROM: full    Dental no notable dental hx.    Pulmonary neg pulmonary ROS, pneumonia, COPD, former smoker   Pulmonary exam normal breath sounds clear to auscultation       Cardiovascular Exercise Tolerance: Good hypertension, + CAD, + Past MI, + CABG, + Peripheral Vascular Disease, +CHF and + DOE  + Valvular Problems/Murmurs  Rhythm:regular Rate:Normal     Neuro/Psych negative neurological ROS  negative psych ROS   GI/Hepatic negative GI ROS, Neg liver ROS, PUD,GERD  ,,  Endo/Other  negative endocrine ROSdiabetes    Renal/GU negative Renal ROS  negative genitourinary   Musculoskeletal   Abdominal   Peds  Hematology negative hematology ROS (+) Blood dyscrasia, anemia   Anesthesia Other Findings 1. Left ventricular ejection fraction, by estimation, is 60 to 65%. The  left ventricle has normal function. The left ventricle has no regional  wall motion abnormalities. Left ventricular diastolic parameters are  indeterminate.   2. D-shaped septum suggesting RV pressure/volume overload. Right  ventricular systolic function is mildly reduced. The right ventricular  size is mildly enlarged. There is moderately elevated pulmonary artery  systolic pressure. The estimated right  ventricular systolic pressure is 45.0 mmHg.   3. Left atrial size was moderately dilated.   4. Right atrial size was moderately dilated.   5. The mitral valve is normal in structure. Trivial mitral valve  regurgitation. No evidence of mitral stenosis. Moderate mitral annular  calcification.   6. The aortic valve is tricuspid. There is moderate calcification of the   aortic valve. Aortic valve regurgitation is not visualized. No aortic  stenosis is present.   7. Aortic dilatation noted. There is mild dilatation of the aortic root,  measuring 42 mm.   8. The inferior vena cava is dilated in size with <50% respiratory  variability, suggesting right atrial pressure of 15 mmHg.   9. The patient is in atrial fibrillation.     Reproductive/Obstetrics negative OB ROS                              Anesthesia Physical Anesthesia Plan  ASA: 3  Anesthesia Plan: General and General LMA   Post-op Pain Management:    Induction:   PONV Risk Score and Plan: Ondansetron  Airway Management Planned:   Additional Equipment:   Intra-op Plan:   Post-operative Plan:   Informed Consent: I have reviewed the patients History and Physical, chart, labs and discussed the procedure including the risks, benefits and alternatives for the proposed anesthesia with the patient or authorized representative who has indicated his/her understanding and acceptance.     Dental Advisory Given  Plan Discussed with: CRNA  Anesthesia Plan Comments:          Anesthesia Quick Evaluation

## 2023-06-14 ENCOUNTER — Encounter (HOSPITAL_COMMUNITY): Payer: Self-pay | Admitting: Urology

## 2023-06-14 NOTE — Anesthesia Postprocedure Evaluation (Signed)
 Anesthesia Post Note  Patient: Troy [name]  Procedure(s) Performed: CYSTOSCOPY WITH INJECTION- Botox 100 units (Bladder)  Patient location during evaluation: Phase II Anesthesia Type: General Level of consciousness: awake Pain management: pain level controlled Vital Signs Assessment: post-procedure vital signs reviewed and stable Respiratory status: spontaneous breathing and respiratory function stable Cardiovascular status: blood pressure returned to baseline and stable Postop Assessment: no headache and no apparent nausea or vomiting Anesthetic complications: no Comments: Late entry   No notable events documented.   Last Vitals:  Vitals:   06/13/23 1145 06/13/23 1202  BP: (!) 165/68 (!) 169/70  Pulse: (!) 58 (!) 58  Resp: 18 16  Temp: 36.5 C (!) 36.3 C  SpO2: 97% 99%    Last Pain:  Vitals:   06/13/23 1202  TempSrc: Oral  PainSc: 0-No pain                 Windell Norfolk

## 2023-06-17 NOTE — Progress Notes (Unsigned)
 Name: Troy [name] DOB: 1941-01-14 MRN: [id number]  Diagnoses: Post-operative state  HPI: Troy [name] presents post-operatively.  GU History: 1. Prostate cancer.  - Treated with EBRT and brachytherapy. 2. Posterior wall bladder tumor.  - 08/31/2021: Underwent TURBT by Dr. Ronne Binning.  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  - Has required prior clot evacuation and fulgeration of multiple bladder and prostatic lesions. - Taking Macrodantin (Nitrofurantoin) 50 mg daily for UTI prophylaxis. 4. LUTS including urinary frequency, nocturia, urgency, and urge incontinence. - Has failed multiple anticholinergics and beta 3 adrenergic agonists.  - Failed PTNS. - Urodynamic testing attempted at [facility] but the UDS catheter could not be placed.  5. Bilateral renal cysts. 6. Recurrent epididymitis. - 10/02/2022: No acute findings on scrotal ultrasound. Has a right hydrocele along with left epididymal and testicular cysts. - Denies any scrotal pain since he had a steroid injection for his back a few months ago at his PCP's office.  Today He presents s/p the following procedures by Dr. Ronne Binning on 06/13/2023:  Preoperative diagnosis: Overactive bladder   Postoperative diagnosis: same   Procedure:  1. Cystoscopy 2. Intravesical botox injection 100 units  Postop course: Today He reports that he had a few days of gross hematuria with clots after the procedure which gradually improved; denies seeing any hematuria at this point. He denies acute flank pain, abdominal pain, fevers, nausea, or vomiting.  Reports mild dysuria. Thus far He denies any noticeable change / improvement in his urinary urgency, frequency, or incontinence since surgery. Denies hesitancy, straining to void, or sensations of incomplete emptying.   Medications: Current Outpatient Medications  Medication Sig Dispense Refill   acetaminophen (TYLENOL) 500 MG tablet Take 500-1,000 mg by mouth every 6  (six) hours as needed (pain.).     albuterol (VENTOLIN HFA) 108 (90 Base) MCG/ACT inhaler Inhale 2 puffs into the lungs every 6 (six) hours as needed for wheezing or shortness of breath. 1 each 2   aspirin 81 MG EC tablet Take 1 tablet (81 mg total) by mouth daily with breakfast. 30 tablet 3   atorvastatin (LIPITOR) 40 MG tablet Take 1 tablet (40 mg total) by mouth daily. (Patient taking differently: Take 40 mg by mouth every evening.) 90 tablet 1   Cholecalciferol (VITAMIN D3) 50 MCG (2000 UT) capsule Take 2,000 Units by mouth daily.     dutasteride (AVODART) 0.5 MG capsule Take 1 capsule (0.5 mg total) by mouth daily. 30 capsule 11   HYDROcodone-acetaminophen (NORCO) 10-325 MG tablet Take 1 tablet by mouth 2 (two) times daily as needed (pain.).     Incontinence Supplies (BARD [place] INCONTIN CLAMP) MISC 1 Units by Does not apply route daily. 1 each 0   ipratropium (ATROVENT) 0.03 % nasal spray Place 2 sprays into both nostrils 2 (two) times daily as needed for rhinitis.     isosorbide mononitrate (IMDUR) 60 MG 24 hr tablet Take 2 tablets (120 mg total) by mouth every evening. 180 tablet 3   latanoprost (XALATAN) 0.005 % ophthalmic solution Place 1 drop into both eyes at bedtime.     levocetirizine (XYZAL) 5 MG tablet Take 5 mg by mouth every evening.     loratadine (CLARITIN) 10 MG tablet Take 10 mg by mouth daily as needed (sinus/allergies.).     losartan (COZAAR) 50 MG tablet Take 1 tablet (50 mg total) by mouth daily. 30 tablet 1   metFORMIN (GLUCOPHAGE) 500 MG tablet Take 500 mg by mouth 2 (two) times  daily with a meal.     nitrofurantoin (MACRODANTIN) 50 MG capsule Take 1 capsule (50 mg total) by mouth at bedtime. 30 capsule 5   nitroGLYCERIN (NITROSTAT) 0.4 MG SL tablet Place 1 tablet (0.4 mg total) under the tongue every 5 (five) minutes x 3 doses as needed for chest pain. 25 tablet 3   OXYGEN Inhale 3 L into the lungs at bedtime.     polyethylene glycol powder (GLYCOLAX/MIRALAX) 17  GM/SCOOP powder Take 8.5 g by mouth daily.     spironolactone (ALDACTONE) 25 MG tablet TAKE 1 TABLET EVERY DAY 90 tablet 3   torsemide (DEMADEX) 10 MG tablet Take 1 tablet (10 mg total) by mouth every 3 (three) days.     triamcinolone cream (KENALOG) 0.1 % Apply 1 Application topically 2 (two) times daily as needed (skin irritation.).     warfarin (COUMADIN) 2 MG tablet Take 2 mg by mouth every evening.     pantoprazole (PROTONIX) 40 MG tablet Take 1 tablet (40 mg total) by mouth daily. Take 30-60 min before first meal of the day     No current facility-administered medications for this visit.    Allergies: Allergies  Allergen Reactions   Niacin Itching and Rash    Burning sensation   Contrast Media [Iodinated Contrast Media] Nausea And Vomiting    Per patient vocal cords thick with increased phlegm    Gemtesa [Vibegron] Other (See Comments)    UTI    Iodine-131 Nausea And Vomiting   Jardiance [Empagliflozin] Other (See Comments)    UTI   Myrbetriq [Mirabegron] Other (See Comments)    UTI   Nsaids Other (See Comments)    Taking Coumadin    Past Medical History:  Diagnosis Date   A-fib (HCC)    AAA (abdominal aortic aneurysm) (HCC)    Followed by Dr. Tawanna Cooler Early   Arthritis    CAD (coronary artery disease)    a. CABG 2000.   Cancer [facility])    Prostate:  Radiation Tx   Carotid artery disease (HCC)    Chest pain    precordial. mild chronic .[name]... nonischemic   CHF (congestive heart failure) (HCC)    Chronic edema    Coronary artery disease    a. Nuclear, January, 2008, no ischemia b. 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 Arbie Cookey   Sinus bradycardia    Asymptomatic   Past Surgical History:  Procedure Laterality Date   ABDOMINAL AORTIC ENDOVASCULAR STENT GRAFT N/A 09/26/2022   Procedure: ABDOMINAL AORTIC ENDOVASCULAR STENT GRAFT;  Surgeon: Cephus Shelling, MD;  Location: [facility] OR;  Service: Vascular;  Laterality: N/A;   AORTOGRAM  09/26/2022   Procedure: AORTOGRAM;  Surgeon: Cephus Shelling, MD;  Location: [facility] OR;  Service: Vascular;;   BALLOON DILATION  03/14/2023   Procedure:  BALLOON DILATION;  Surgeon: Franky Macho, MD;  [facility];  Service: Endoscopy;;   BIOPSY  03/14/2023   Procedure: BIOPSY;  Surgeon: Franky Macho, MD;  [facility];  Service: Endoscopy;;   CARDIAC CATHETERIZATION  09/26/2012   1/4 patent bypass (occluded SVG-PDA, SVG-OM, LIMA-LAD), SVG-diagonal patent and fills the diagonal and LAD, distal RCA occlusion with left to right collateralization, patent circumflex, LAD with no flow-limiting disease and antegrade flow competitively from SVG-diagonal; EF 60-65%   COLONOSCOPY  11/30/2009   [id number]. next TCS 11/2019   COLONOSCOPY WITH PROPOFOL N/A 12/18/2019   Procedure: COLONOSCOPY WITH PROPOFOL;  Surgeon: Dolores Frame, MD;  [facility];  Service: Gastroenterology;  Laterality: N/A;  1030   COLONOSCOPY WITH PROPOFOL N/A 03/14/2023   Procedure: COLONOSCOPY WITH PROPOFOL;  Surgeon: Franky Macho, MD;  [facility];  Service: Endoscopy;  Laterality: N/A;  12:45pm;asa 3   CORONARY ARTERY BYPASS GRAFT  2000   CYSTOSCOPY N/A 08/31/2021   Procedure: CYSTOSCOPY;  Surgeon: Malen Gauze, MD;  Location: [facility];  Service: Urology;  Laterality: N/A;  pt knows to arrive at 7:00   CYSTOSCOPY WITH FULGERATION N/A 03/22/2022    Procedure: CYSTOSCOPY WITH FULGERATION;  Surgeon: Malen Gauze, MD;  Location: [facility];  Service: Urology;  Laterality: N/A;   CYSTOSCOPY WITH INJECTION N/A 06/13/2023   Procedure: CYSTOSCOPY WITH INJECTION- Botox 100 units;  Surgeon: Malen Gauze, MD;  Location: [facility];  Service: Urology;  Laterality: N/A;  pt will arrive between 8:30 - 9:00   ENDARTERECTOMY FEMORAL Right 09/26/2022   Procedure: RIGHT COMMON FEMORAL ENDARTERECTOMY WITH BOVINE PATCH;  Surgeon: Cephus Shelling, MD;  Location: [facility] OR;  Service: Vascular;  Laterality: Right;   ESOPHAGOGASTRODUODENOSCOPY N/A 08/12/2013   UXL:[id number] peptic stricture with erosive refluxesophagitis - status post Maloney dilation. Hiatal hernia. Abnormalgastric mucosa. Deformity of the pyloric channel suggestive ofprior peptic ulcer disease. Duodenal bulbar diverticulum Statuspost gastric biopsy. h.pylori   ESOPHAGOGASTRODUODENOSCOPY (EGD) WITH PROPOFOL N/A 03/14/2023   Procedure: ESOPHAGOGASTRODUODENOSCOPY (EGD) WITH PROPOFOL;  Surgeon: Franky Macho, MD;  [facility];  Service: Endoscopy;  Laterality: N/A;  12:45pm;asa 3   LEFT HEART CATHETERIZATION WITH CORONARY/GRAFT ANGIOGRAM N/A 09/26/2012   Procedure: LEFT HEART CATHETERIZATION WITH Isabel Caprice;  Surgeon: Kathleene Hazel, MD;  Location: [facility] CATH LAB;  Service: Cardiovascular;  Laterality: N/A;   MALONEY DILATION N/A 08/12/2013   Procedure: Elease Hashimoto DILATION;  Surgeon: Corbin Ade, MD;  [facility];  Service: Endoscopy;  Laterality: N/A;   POLYPECTOMY  12/18/2019   Procedure: POLYPECTOMY;  Surgeon: Dolores Frame, MD;  [facility];  Service: Gastroenterology;;  ascending colon polyp    POLYPECTOMY  03/14/2023   Procedure: POLYPECTOMY INTESTINAL;  Surgeon: Franky Macho, MD;  [facility];  Service: Endoscopy;;   PR VEIN BYPASS GRAFT,AORTO-FEM-POP Right 07/19/1999   PR VEIN BYPASS  GRAFT,AORTO-FEM-POP Left 05/02/2006   RIGHT HEART CATH AND CORONARY/GRAFT ANGIOGRAPHY N/A 04/11/2020   Procedure: RIGHT HEART CATH AND CORONARY/GRAFT ANGIOGRAPHY;  Surgeon: Laurey Morale, MD;  Location: [facility] LAB;  Service: Cardiovascular;  Laterality: N/A;   SAVORY DILATION N/A 08/12/2013   Procedure: SAVORY DILATION;  Surgeon: Corbin Ade, MD;  [facility];  Service: Endoscopy;  Laterality: N/A;   TRANSURETHRAL RESECTION OF BLADDER TUMOR N/A 08/31/2021   Procedure: TRANSURETHRAL RESECTION OF BLADDER TUMOR (TURBT);  Surgeon: Malen Gauze, MD;  Location: [facility];  Service: Urology;  Laterality: N/A;   ULTRASOUND GUIDANCE FOR VASCULAR ACCESS Bilateral 09/26/2022   Procedure: ULTRASOUND GUIDANCE FOR VASCULAR ACCESS;  Surgeon: Cephus Shelling, MD;  Location: [facility] OR;  Service: Vascular;  Laterality: Bilateral;   VASCULAR SURGERY     WRIST SURGERY     cyst removal   Family History  Problem Relation Age of Onset   Deep vein thrombosis Father    Lung cancer Sister    Diabetes Sister    Heart disease Sister        After age 88   Hyperlipidemia Sister    Hypertension Sister    Lung cancer Sister    Breast cancer Sister    Hypertension Mother    Diabetes Sister    Coronary artery disease Other        family hx of   Cancer Brother        "crab cancer"   Heart disease Brother    Heart attack Brother    Heart attack Daughter    Colon cancer Neg Hx    Stroke Neg Hx    Social History   Socioeconomic History   Marital status: Widowed    Spouse name: Not on file   Number of children: Not on file   Years of education: Not on file   Highest education level: Not on file  Occupational History   Not on file  Tobacco Use   Smoking status: Former    Current packs/day: 0.00    Average packs/day: 2.0 packs/day for 70.0 years (140.0 ttl pk-yrs)    Types: Cigarettes    Start date: 04/21/1948    Quit date: 04/21/2018    Years since quitting: 5.1   Smokeless  tobacco: Never  Vaping Use   Vaping status: Never Used  Substance and Sexual Activity   Alcohol use: Yes    Alcohol/week: 0.0 - 2.0 standard drinks of alcohol    Comment: OCCASIONAL   Drug use: Never   Sexual activity: Not on file  Other Topics Concern   Not on file  Social History Narrative   Not on file   Social Drivers of Health   Financial Resource Strain: Not on file  Food Insecurity: No Food Insecurity (09/26/2022)   Hunger Vital Sign    Worried About Running Out of Food in the Last Year: Never true    Ran Out of Food in the Last Year: Never true  Transportation Needs: No Transportation Needs (09/26/2022)   PRAPARE - Administrator, Civil Service (Medical): No    Lack of Transportation (Non-Medical): No  Physical Activity: Not on file  Stress: Not on file  Social Connections: Unknown (10/18/2021)   [facility]   Social Network    Social Network: Not on file  Intimate Partner Violence: Not At Risk (09/26/2022)   Humiliation, Afraid, Rape, and Kick questionnaire    Fear of Current or Ex-Partner: No    Emotionally Abused: No    Physically Abused: No    Sexually Abused: No    SUBJECTIVE  Review of Systems Constitutional: Patient denies any unintentional weight loss or change in strength lntegumentary: Patient denies any rashes or pruritus Cardiovascular: Patient denies chest pain or syncope Respiratory: Patient denies shortness of breath Gastrointestinal: As per HPI Musculoskeletal: Patient denies muscle cramps or weakness Neurologic: Patient denies convulsions or seizures Allergic/Immunologic: Patient denies recent allergic reaction(s) Hematologic/Lymphatic: Patient denies bleeding tendencies Endocrine: Patient denies heat/cold  intolerance  GU: As per HPI.  OBJECTIVE Vitals:   06/18/23 1040  BP: (!) 187/77  Pulse: (!) 57   There is no height or weight on file to calculate BMI.  Physical  Examination Constitutional: No obvious distress; patient is non-toxic appearing  Cardiovascular: No visible lower extremity edema.  Respiratory: The patient does not have audible wheezing/stridor; respirations do not appear labored  Gastrointestinal: Abdomen non-distended Musculoskeletal: Normal ROM of UEs  Skin: No obvious rashes/open sores  Neurologic: CN 2-12 grossly intact Psychiatric: Answered questions appropriately with normal affect  Hematologic/Lymphatic/Immunologic: No obvious bruises or sites of spontaneous bleeding  UA: Patient did not void in office today - reported urinating prior to arrival  PVR: 0 ml  ASSESSMENT Urinary incontinence, unspecified type - Plan: BLADDER SCAN AMB NON-IMAGING  Postop check  Gross hematuria  Malignant neoplasm of prostate (HCC)  Overactive bladder  We reviewed the operative procedures and findings. He is doing well today. Pre-operative symptoms are unchanged since the procedure; we discussed possible improvement once postop inflammation has had time to fully resolve.   We reviewed the risk for clot retention if gross hematuria recurs; he was advised to maintain adequate daily fluid intake especially if having hematuria in order to thin out clots.   Will plan for follow up as previously scheduled with Dr. Ronne Binning next week on 06/24/2023 or sooner if needed. Pt verbalized understanding and agreement. All questions were answered.   PLAN Advised the following: Return in 6 days (on 06/24/2023) for f/u with Dr. Ronne Binning as previously scheduled.  Orders Placed This Encounter  Procedures   BLADDER SCAN AMB NON-IMAGING   It has been explained that the patient is to follow regularly with their PCP in addition to all other providers involved in their care and to follow instructions provided by these respective offices. Patient advised to contact urology clinic if any urologic-pertaining questions, concerns, new symptoms or problems arise in the  interim period.  There are no Patient Instructions on file for this visit.  Electronically signed by:  Donnita Falls, MSN, FNP-C, CUNP 06/18/2023 11:14 AM

## 2023-06-18 ENCOUNTER — Encounter: Payer: Self-pay | Admitting: Urology

## 2023-06-18 ENCOUNTER — Ambulatory Visit (INDEPENDENT_AMBULATORY_CARE_PROVIDER_SITE_OTHER): Admitting: Urology

## 2023-06-18 VITALS — BP 187/77 | HR 57

## 2023-06-18 DIAGNOSIS — C61 Malignant neoplasm of prostate: Secondary | ICD-10-CM | POA: Diagnosis not present

## 2023-06-18 DIAGNOSIS — N3281 Overactive bladder: Secondary | ICD-10-CM | POA: Diagnosis not present

## 2023-06-18 DIAGNOSIS — R310 Gross hematuria: Secondary | ICD-10-CM

## 2023-06-18 DIAGNOSIS — R32 Unspecified urinary incontinence: Secondary | ICD-10-CM | POA: Diagnosis not present

## 2023-06-18 DIAGNOSIS — Z09 Encounter for follow-up examination after completed treatment for conditions other than malignant neoplasm: Secondary | ICD-10-CM

## 2023-06-18 LAB — BLADDER SCAN AMB NON-IMAGING: Scan Result: 0

## 2023-06-24 ENCOUNTER — Ambulatory Visit: Payer: No Typology Code available for payment source | Admitting: Urology

## 2023-06-24 VITALS — BP 187/92 | HR 73

## 2023-06-24 DIAGNOSIS — N3281 Overactive bladder: Secondary | ICD-10-CM

## 2023-06-24 DIAGNOSIS — R399 Unspecified symptoms and signs involving the genitourinary system: Secondary | ICD-10-CM | POA: Diagnosis not present

## 2023-06-24 LAB — MICROSCOPIC EXAMINATION
RBC, Urine: >30 /HPF — AB (ref 0–2)
WBC, UA: >30 /HPF — AB (ref 0–5)

## 2023-06-24 LAB — URINALYSIS, ROUTINE W REFLEX MICROSCOPIC
Bilirubin, UA: NEGATIVE
Glucose, UA: NEGATIVE
Ketones, UA: NEGATIVE
Nitrite, UA: NEGATIVE
Specific Gravity, UA: <1.005 — ABNORMAL LOW (ref 1.005–1.030)
Urobilinogen, Ur: 0.2 mg/dL (ref 0.2–1.0)
pH, UA: 6.5 (ref 5.0–7.5)

## 2023-06-24 NOTE — Progress Notes (Signed)
 post void residual=0 ?

## 2023-06-24 NOTE — Progress Notes (Signed)
 06/24/2023 3:05 PM   Troy [name] 12-24-1940 [id number]  Referring provider: Kathrin Penner, PA-C [address] [place],  [place] [address]  Followup OAB   HPI: [name] is a 83yo here for followup for OAB and gross hematuria. He underwent intravesical botox on 3/13. He notes some improvement in the incontinence. He had hematuria after the procedure which has resolved.    PMH: Past Medical History:  Diagnosis Date   A-fib [facility])    AAA (abdominal aortic aneurysm) (HCC)    Followed by Dr. Tawanna Cooler Early   Arthritis    CAD (coronary artery disease)    a. CABG 2000.   Cancer [facility] 2)    Prostate:  Radiation Tx   Carotid artery disease (HCC)    Chest pain    precordial. mild chronic .[name]... nonischemic   CHF (congestive heart failure) (HCC)    Chronic edema    Coronary artery disease    a. Nuclear, January, 2008, no ischemia b. 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 Arbie Cookey   Sinus bradycardia    Asymptomatic    Surgical History: Past Surgical History:  Procedure Laterality Date   ABDOMINAL AORTIC ENDOVASCULAR STENT GRAFT N/A 09/26/2022   Procedure: ABDOMINAL AORTIC ENDOVASCULAR STENT GRAFT;  Surgeon: Cephus Shelling, MD;  Location: [facility] OR;  Service: Vascular;  Laterality: N/A;   AORTOGRAM  09/26/2022   Procedure: AORTOGRAM;  Surgeon: Cephus Shelling, MD;  Location: [facility] OR;  Service: Vascular;;   BALLOON DILATION  03/14/2023    Procedure: [place];  Surgeon: Franky Macho, MD;  [facility];  Service: Endoscopy;;   BIOPSY  03/14/2023   Procedure: BIOPSY;  Surgeon: Franky Macho, MD;  [facility];  Service: Endoscopy;;   CARDIAC CATHETERIZATION  09/26/2012   1/4 patent bypass (occluded SVG-PDA, SVG-OM, LIMA-LAD), SVG-diagonal patent and fills the diagonal and LAD, distal RCA occlusion with left to right collateralization, patent circumflex, LAD with no flow-limiting disease and antegrade flow competitively from SVG-diagonal; EF 60-65%   COLONOSCOPY  11/30/2009   XBJ:[id number]. next TCS 11/2019   COLONOSCOPY WITH PROPOFOL N/A 12/18/2019   Procedure: COLONOSCOPY WITH PROPOFOL;  Surgeon: Dolores Frame, MD;  [facility];  Service: Gastroenterology;  Laterality: N/A;  1030   COLONOSCOPY WITH PROPOFOL N/A 03/14/2023   Procedure: COLONOSCOPY WITH PROPOFOL;  Surgeon: Franky Macho, MD;  [facility];  Service: Endoscopy;  Laterality: N/A;  12:45pm;asa 3   CORONARY ARTERY BYPASS GRAFT  2000   CYSTOSCOPY N/A 08/31/2021   Procedure: CYSTOSCOPY;  Surgeon: Malen Gauze, MD;  Location: [facility] ORS;  Service: Urology;  Laterality: N/A;  pt knows to arrive at 7:00   CYSTOSCOPY WITH FULGERATION N/A 03/22/2022   Procedure: CYSTOSCOPY WITH FULGERATION;  Surgeon: Malen Gauze, MD;  Location: [facility] ORS;  Service: Urology;  Laterality: N/A;   CYSTOSCOPY WITH INJECTION N/A 06/13/2023   Procedure: CYSTOSCOPY WITH INJECTION- Botox 100 units;  Surgeon: Malen Gauze, MD;  Location: [facility] OR[facility];  Service: Urology;  Laterality: N/A;  pt will arrive between 8:30 - 9:00   ENDARTERECTOMY FEMORAL Right 09/26/2022   Procedure: RIGHT COMMON FEMORAL ENDARTERECTOMY WITH BOVINE PATCH;  Surgeon: Cephus Shelling, MD;  Location: [facility] OR;  Service: Vascular;  Laterality: Right;   ESOPHAGOGASTRODUODENOSCOPY N/A 08/12/2013   ZOX:[id number] peptic stricture with  erosive refluxesophagitis - status post Maloney dilation. Hiatal hernia. Abnormalgastric mucosa. Deformity of the pyloric channel suggestive ofprior peptic ulcer disease. Duodenal bulbar diverticulum Statuspost gastric biopsy. h.pylori   ESOPHAGOGASTRODUODENOSCOPY (EGD) WITH PROPOFOL N/A 03/14/2023   Procedure: ESOPHAGOGASTRODUODENOSCOPY (EGD) WITH PROPOFOL;  Surgeon: Franky Macho, MD;  [facility];  Service: Endoscopy;  Laterality: N/A;  12:45pm;asa 3   LEFT HEART CATHETERIZATION WITH CORONARY/GRAFT ANGIOGRAM N/A 09/26/2012   Procedure: LEFT HEART CATHETERIZATION WITH Isabel Caprice;  Surgeon: Kathleene Hazel, MD;  Location: [facility] CATH LAB;  Service: Cardiovascular;  Laterality: N/A;   MALONEY DILATION N/A 08/12/2013   Procedure: Elease Hashimoto DILATION;  Surgeon: Corbin Ade, MD;  [facility];  Service: Endoscopy;  Laterality: N/A;   POLYPECTOMY  12/18/2019   Procedure: POLYPECTOMY;  Surgeon: Dolores Frame, MD;  [facility];  Service: Gastroenterology;;  ascending colon polyp    POLYPECTOMY  03/14/2023   Procedure: POLYPECTOMY INTESTINAL;  Surgeon: Franky Macho, MD;  [facility];  Service: Endoscopy;;   PR VEIN BYPASS GRAFT,AORTO-FEM-POP Right 07/19/1999   PR VEIN BYPASS GRAFT,AORTO-FEM-POP Left 05/02/2006   RIGHT HEART CATH AND CORONARY/GRAFT ANGIOGRAPHY N/A 04/11/2020   Procedure: RIGHT HEART CATH AND CORONARY/GRAFT ANGIOGRAPHY;  Surgeon: Laurey Morale, MD;  Location: [facility] INVASIVE CV LAB;  Service: Cardiovascular;  Laterality: N/A;   SAVORY DILATION N/A 08/12/2013   Procedure: SAVORY DILATION;  Surgeon: Corbin Ade, MD;  [facility];  Service: Endoscopy;  Laterality: N/A;   TRANSURETHRAL RESECTION OF BLADDER TUMOR N/A 08/31/2021   Procedure: TRANSURETHRAL RESECTION OF BLADDER TUMOR (TURBT);  Surgeon: Malen Gauze, MD;  Location: [facility];  Service: Urology;  Laterality: N/A;   ULTRASOUND  GUIDANCE FOR VASCULAR ACCESS Bilateral 09/26/2022   Procedure: ULTRASOUND GUIDANCE FOR VASCULAR ACCESS;  Surgeon: Cephus Shelling, MD;  Location: [facility] OR;  Service: Vascular;  Laterality: Bilateral;   VASCULAR SURGERY     WRIST SURGERY     cyst removal    Home Medications:  Allergies as of 06/24/2023       Reactions   Niacin Itching, Rash   Burning sensation   Contrast Media [iodinated Contrast Media] Nausea And Vomiting   Per patient vocal cords thick with increased phlegm    Gemtesa [vibegron] Other (See Comments)   UTI    Iodine-131 Nausea And Vomiting   Jardiance [empagliflozin] Other (See Comments)   UTI   Myrbetriq [mirabegron] Other (See Comments)   UTI   Nsaids Other (See Comments)   Taking Coumadin        Medication List        Accurate as of June 24, 2023  3:05 PM. If you have any questions, ask your nurse or doctor.          acetaminophen 500 MG tablet Commonly known as: TYLENOL Take 500-1,000 mg by mouth every 6 (six) hours as needed (pain.).   albuterol 108 (90  Base) MCG/ACT inhaler Commonly known as: VENTOLIN HFA Inhale 2 puffs into the lungs every 6 (six) hours as needed for wheezing or shortness of breath.   aspirin EC 81 MG tablet Take 1 tablet (81 mg total) by mouth daily with breakfast.   atorvastatin 40 MG tablet Commonly known as: LIPITOR Take 1 tablet (40 mg total) by mouth daily. What changed: when to take this   Bard Cunningham Incontin Clamp Misc 1 Units by Does not apply route daily.   dutasteride 0.5 MG capsule Commonly known as: AVODART Take 1 capsule (0.5 mg total) by mouth daily.   HYDROcodone-acetaminophen 10-325 MG tablet Commonly known as: NORCO Take 1 tablet by mouth 2 (two) times daily as needed (pain.).   ipratropium 0.03 % nasal spray Commonly known as: ATROVENT Place 2 sprays into both nostrils 2 (two) times daily as needed for rhinitis.   isosorbide mononitrate 60 MG 24 hr tablet Commonly known as:  IMDUR Take 2 tablets (120 mg total) by mouth every evening.   latanoprost 0.005 % ophthalmic solution Commonly known as: XALATAN Place 1 drop into both eyes at bedtime.   levocetirizine 5 MG tablet Commonly known as: XYZAL Take 5 mg by mouth every evening.   loratadine 10 MG tablet Commonly known as: CLARITIN Take 10 mg by mouth daily as needed (sinus/allergies.).   losartan 50 MG tablet Commonly known as: COZAAR Take 1 tablet (50 mg total) by mouth daily.   metFORMIN 500 MG tablet Commonly known as: GLUCOPHAGE Take 500 mg by mouth 2 (two) times daily with a meal.   nitrofurantoin 50 MG capsule Commonly known as: Macrodantin Take 1 capsule (50 mg total) by mouth at bedtime.   nitroGLYCERIN 0.4 MG SL tablet Commonly known as: NITROSTAT Place 1 tablet (0.4 mg total) under the tongue every 5 (five) minutes x 3 doses as needed for chest pain.   OXYGEN Inhale 3 L into the lungs at bedtime.   pantoprazole 40 MG tablet Commonly known as: PROTONIX Take 1 tablet (40 mg total) by mouth daily. Take 30-60 min before first meal of the day   polyethylene glycol powder 17 GM/SCOOP powder Commonly known as: GLYCOLAX/MIRALAX Take 8.5 g by mouth daily.   spironolactone 25 MG tablet Commonly known as: ALDACTONE TAKE 1 TABLET EVERY DAY   torsemide 10 MG tablet Commonly known as: DEMADEX Take 1 tablet (10 mg total) by mouth every 3 (three) days.   triamcinolone cream 0.1 % Commonly known as: KENALOG Apply 1 Application topically 2 (two) times daily as needed (skin irritation.).   Vitamin D3 50 MCG (2000 UT) capsule Take 2,000 Units by mouth daily.   warfarin 2 MG tablet Commonly known as: COUMADIN Take 2 mg by mouth every evening.        Allergies:  Allergies  Allergen Reactions   Niacin Itching and Rash    Burning sensation   Contrast Media [Iodinated Contrast Media] Nausea And Vomiting    Per patient vocal cords thick with increased phlegm    Gemtesa [Vibegron]  Other (See Comments)    UTI    Iodine-131 Nausea And Vomiting   Jardiance [Empagliflozin] Other (See Comments)    UTI   Myrbetriq [Mirabegron] Other (See Comments)    UTI   Nsaids Other (See Comments)    Taking Coumadin    Family History: Family History  Problem Relation Age of Onset   Deep vein thrombosis Father    Lung cancer Sister    Diabetes Sister  Heart disease Sister        After age 85   Hyperlipidemia Sister    Hypertension Sister    Lung cancer Sister    Breast cancer Sister    Hypertension Mother    Diabetes Sister    Coronary artery disease Other        family hx of   Cancer Brother        "crab cancer"   Heart disease Brother    Heart attack Brother    Heart attack Daughter    Colon cancer Neg Hx    Stroke Neg Hx     Social History:  reports that he quit smoking about 5 years ago. His smoking use included cigarettes. He started smoking about 75 years ago. He has a 140 pack-year smoking history. He has never used smokeless tobacco. He reports current alcohol use. He reports that he does not use drugs.  ROS: All other review of systems were reviewed and are negative except what is noted above in HPI  Physical Exam: BP (!) 187/92   Pulse 73   Constitutional:  Alert and oriented, No acute distress. HEENT: [place] AT, moist mucus membranes.  Trachea midline, no masses. Cardiovascular: No clubbing, cyanosis, or edema. Respiratory: Normal respiratory effort, no increased work of breathing. GI: Abdomen is soft, nontender, nondistended, no abdominal masses GU: No CVA tenderness.  Lymph: No cervical or inguinal lymphadenopathy. Skin: No rashes, bruises or suspicious lesions. Neurologic: Grossly intact, no focal deficits, moving all 4 extremities. Psychiatric: Normal mood and affect.  Laboratory Data: Lab Results  Component Value Date   WBC 8.3 12/11/2022   HGB 12.2 (L) 12/11/2022   HCT 39.9 12/11/2022   MCV 89.9 12/11/2022   PLT 241 12/11/2022    Lab  Results  Component Value Date   CREATININE 0.96 06/10/2023    No results found for: "PSA"  No results found for: "TESTOSTERONE"  Lab Results  Component Value Date   HGBA1C 6.8 (H) 06/10/2023    Urinalysis    Component Value Date/Time   COLORURINE YELLOW 09/30/2022 1810   APPEARANCEUR Clear 06/03/2023 1309   LABSPEC 1.006 09/30/2022 1810   PHURINE 7.0 09/30/2022 1810   GLUCOSEU Negative 06/03/2023 1309   HGBUR SMALL (A) 09/30/2022 1810   BILIRUBINUR Negative 06/03/2023 1309   KETONESUR NEGATIVE 09/30/2022 1810   PROTEINUR 1+ (A) 06/03/2023 1309   PROTEINUR NEGATIVE 09/30/2022 1810   NITRITE Positive (A) 06/03/2023 1309   NITRITE NEGATIVE 09/30/2022 1810   LEUKOCYTESUR Trace (A) 06/03/2023 1309   LEUKOCYTESUR NEGATIVE 09/30/2022 1810    Lab Results  Component Value Date   LABMICR See below: 06/03/2023   WBCUA 6-10 (A) 06/03/2023   LABEPIT 0-10 06/03/2023   MUCUS Present (A) 01/16/2023   BACTERIA Moderate (A) 06/03/2023    Pertinent Imaging:  No results found for this or any previous visit.  No results found for this or any previous visit.  No results found for this or any previous visit.  No results found for this or any previous visit.  No results found for this or any previous visit.  No results found for this or any previous visit.  Results for orders placed during the hospital encounter of 08/18/21  CT HEMATURIA WORKUP  Narrative CLINICAL DATA:  Gross hematuria since early May, history of prostate cancer * Tracking Code: BO *  EXAM: CT ABDOMEN AND PELVIS WITHOUT AND WITH CONTRAST  TECHNIQUE: Multidetector CT imaging of the abdomen and pelvis was performed following  the standard protocol before and following the bolus administration of intravenous contrast.  RADIATION DOSE REDUCTION: This exam was performed according to the departmental dose-optimization program which includes automated exposure control, adjustment of the mA and/or kV  according to patient size and/or use of iterative reconstruction technique.  CONTRAST:  OMNIPAQUE IOHEXOL 300 MG/ML  SOLN  COMPARISON:  None Available.  FINDINGS: Lower chest: No acute abnormality. Cardiomegaly. Coronary artery calcifications.  Hepatobiliary: No solid liver abnormality is seen. Small, definitively benign cyst or hemangioma of the central liver dome for which no further follow-up or characterization is required (series 15, image 30). No gallstones, gallbladder wall thickening, or biliary dilatation.  Pancreas: Unremarkable. No pancreatic ductal dilatation or surrounding inflammatory changes.  Spleen: Normal in size without significant abnormality.  Adrenals/Urinary Tract: Adrenal glands are unremarkable. Small simple, benign bilateral renal cortical cysts, as well as intrinsically hyperdense, nonenhancing hemorrhagic or proteinaceous cysts of the midportions of the left and right kidneys (series 2, image 37). No urinary tract filling defect on delayed phase imaging. Bladder is unremarkable.  Stomach/Bowel: Stomach is within normal limits. Appendix appears normal. No evidence of bowel wall thickening, distention, or inflammatory changes. Descending and sigmoid diverticulosis.  Vascular/Lymphatic: Aortic atherosclerosis. 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 No enlarged abdominal or pelvic lymph nodes.  Reproductive: Prostate brachytherapy.  Other: Small, fat containing bilateral inguinal hernias. No ascites.  Musculoskeletal: No acute or significant osseous findings.  IMPRESSION: 1. No evidence of urinary tract mass, calculus, or hydronephrosis. No urinary tract filling  defect on delayed phase imaging. 2. Prostate brachytherapy. No evidence of mass or lymphadenopathy in the abdomen or pelvis. 3. Small simple, benign bilateral renal cortical cysts, as well as nonenhancing, benign hemorrhagic or proteinaceous cysts. No further follow-up or characterization is required for these benign renal cysts. 4. 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 Recommend referral to a vascular specialist. This recommendation follows ACR consensus guidelines: White Paper of the ACR Incidental Findings Committee II on Vascular Findings. J Am Coll Radiol 2013; 10:789-794. 5. Cardiomegaly and coronary artery disease.  Aortic Atherosclerosis (ICD10-I70.0).   Electronically Signed By: Jearld Lesch M.D. On: 08/19/2021 11:29  No results found for this or any previous visit.   Assessment & Plan:    1. Overactive bladder (Primary) Followup 1 month with PVR - Urinalysis, Routine w reflex microscopic - BLADDER SCAN AMB NON-IMAGING   No follow-ups on file.  Wilkie Aye, MD  [facility] [place]

## 2023-06-26 ENCOUNTER — Ambulatory Visit: Payer: Medicare HMO | Admitting: Urology

## 2023-06-26 LAB — URINE CULTURE

## 2023-06-29 ENCOUNTER — Encounter: Payer: Self-pay | Admitting: Urology

## 2023-06-29 NOTE — Patient Instructions (Signed)

## 2023-07-06 ENCOUNTER — Other Ambulatory Visit (HOSPITAL_COMMUNITY): Payer: Self-pay | Admitting: Cardiology

## 2023-07-06 DIAGNOSIS — I5032 Chronic diastolic (congestive) heart failure: Secondary | ICD-10-CM

## 2023-07-08 DIAGNOSIS — E6609 Other obesity due to excess calories: Secondary | ICD-10-CM | POA: Diagnosis not present

## 2023-07-08 DIAGNOSIS — Z79899 Other long term (current) drug therapy: Secondary | ICD-10-CM | POA: Diagnosis not present

## 2023-07-08 DIAGNOSIS — M1991 Primary osteoarthritis, unspecified site: Secondary | ICD-10-CM | POA: Diagnosis not present

## 2023-07-08 DIAGNOSIS — I2510 Atherosclerotic heart disease of native coronary artery without angina pectoris: Secondary | ICD-10-CM | POA: Diagnosis not present

## 2023-07-08 DIAGNOSIS — Z7901 Long term (current) use of anticoagulants: Secondary | ICD-10-CM | POA: Diagnosis not present

## 2023-07-08 DIAGNOSIS — G894 Chronic pain syndrome: Secondary | ICD-10-CM | POA: Diagnosis not present

## 2023-07-08 DIAGNOSIS — N182 Chronic kidney disease, stage 2 (mild): Secondary | ICD-10-CM | POA: Diagnosis not present

## 2023-07-08 DIAGNOSIS — E1165 Type 2 diabetes mellitus with hyperglycemia: Secondary | ICD-10-CM | POA: Diagnosis not present

## 2023-07-08 DIAGNOSIS — I10 Essential (primary) hypertension: Secondary | ICD-10-CM | POA: Diagnosis not present

## 2023-07-08 DIAGNOSIS — E782 Mixed hyperlipidemia: Secondary | ICD-10-CM | POA: Diagnosis not present

## 2023-07-24 ENCOUNTER — Encounter (HOSPITAL_COMMUNITY): Payer: Self-pay | Admitting: Cardiology

## 2023-07-24 ENCOUNTER — Ambulatory Visit (HOSPITAL_COMMUNITY)
Admission: RE | Admit: 2023-07-24 | Discharge: 2023-07-24 | Disposition: A | Discharge status: Home / Self Care | Source: Ambulatory Visit | Attending: Cardiology | Admitting: Cardiology

## 2023-07-24 VITALS — BP 144/80 | HR 59 | Wt 201.0 lb

## 2023-07-24 DIAGNOSIS — I2510 Atherosclerotic heart disease of native coronary artery without angina pectoris: Secondary | ICD-10-CM | POA: Diagnosis not present

## 2023-07-24 DIAGNOSIS — D509 Iron deficiency anemia, unspecified: Secondary | ICD-10-CM | POA: Insufficient documentation

## 2023-07-24 DIAGNOSIS — I110 Hypertensive heart disease with heart failure: Secondary | ICD-10-CM | POA: Diagnosis not present

## 2023-07-24 DIAGNOSIS — Z951 Presence of aortocoronary bypass graft: Secondary | ICD-10-CM | POA: Diagnosis not present

## 2023-07-24 DIAGNOSIS — I272 Pulmonary hypertension, unspecified: Secondary | ICD-10-CM | POA: Diagnosis not present

## 2023-07-24 DIAGNOSIS — Z79899 Other long term (current) drug therapy: Secondary | ICD-10-CM | POA: Insufficient documentation

## 2023-07-24 DIAGNOSIS — J9611 Chronic respiratory failure with hypoxia: Secondary | ICD-10-CM | POA: Diagnosis not present

## 2023-07-24 DIAGNOSIS — I739 Peripheral vascular disease, unspecified: Secondary | ICD-10-CM | POA: Diagnosis not present

## 2023-07-24 DIAGNOSIS — I4821 Permanent atrial fibrillation: Secondary | ICD-10-CM | POA: Insufficient documentation

## 2023-07-24 DIAGNOSIS — R590 Localized enlarged lymph nodes: Secondary | ICD-10-CM | POA: Insufficient documentation

## 2023-07-24 DIAGNOSIS — I5032 Chronic diastolic (congestive) heart failure: Secondary | ICD-10-CM | POA: Insufficient documentation

## 2023-07-24 DIAGNOSIS — J3801 Paralysis of vocal cords and larynx, unilateral: Secondary | ICD-10-CM | POA: Insufficient documentation

## 2023-07-24 DIAGNOSIS — R001 Bradycardia, unspecified: Secondary | ICD-10-CM | POA: Diagnosis not present

## 2023-07-24 DIAGNOSIS — Z87891 Personal history of nicotine dependence: Secondary | ICD-10-CM | POA: Insufficient documentation

## 2023-07-24 LAB — BASIC METABOLIC PANEL WITH GFR
Anion gap: 10 (ref 5–15)
BUN: 20 mg/dL (ref 8–23)
CO2: 27 mmol/L (ref 22–32)
Calcium: 9.4 mg/dL (ref 8.9–10.3)
Chloride: 102 mmol/L (ref 98–111)
Creatinine, Ser: 1.04 mg/dL (ref 0.61–1.24)
GFR, Estimated: >60 mL/min (ref >60)
Glucose, Bld: 119 mg/dL — ABNORMAL HIGH (ref 70–99)
Potassium: 5 mmol/L (ref 3.5–5.1)
Sodium: 139 mmol/L (ref 135–145)

## 2023-07-24 LAB — BRAIN NATRIURETIC PEPTIDE: B Natriuretic Peptide: 197.4 pg/mL — ABNORMAL HIGH (ref 0.0–100.0)

## 2023-07-24 LAB — LIPID PANEL
Cholesterol: 124 mg/dL (ref 0–200)
HDL: 30 mg/dL — ABNORMAL LOW (ref >40)
LDL Cholesterol: 71 mg/dL (ref 0–99)
Total CHOL/HDL Ratio: 4.1 ratio
Triglycerides: 115 mg/dL (ref <150)
VLDL: 23 mg/dL (ref 0–40)

## 2023-07-24 MED ORDER — TORSEMIDE 10 MG PO TABS: ORAL_TABLET | ORAL | Status: DC

## 2023-07-24 MED ORDER — LOSARTAN POTASSIUM 100 MG PO TABS: 100.0000 mg | ORAL_TABLET | Freq: Every day | ORAL | 3 refills | Status: DC

## 2023-07-24 MED ORDER — LOSARTAN POTASSIUM 100 MG PO TABS: 100.0000 mg | ORAL_TABLET | Freq: Every day | ORAL | 3 refills | Status: AC

## 2023-07-24 NOTE — Progress Notes (Signed)
 ADVANCED HF CLINIC NOTE  Primary Care: [facility] Cardiology: Dr. Mitzie Anda  Chief Complaint: CHF HPI:  Troy [name] is a 83 y.o. male patient with history of permanent atrial fibrillation, CAD s/p CABG 2000, PAD, chronic diastolic CHF was referred to CHF clinic by Graciela Lava, PA, for evaluation of CHF and pulmonary hypertension.  Patient had CABG in 2000, last cath was in 2014 showing occluded SVG-OM, occluded SVG-PDA, occluded RCA, and atretic LIMA.  SVG-D was patent and native LAD was patent.  Patient had bilateral fem-pop bypasses, peripheral dopplers in 7/21 showed that the right fem-pop was occluded.  He is in permanent atrial fibrillation on warfarin. Echo in 10/21 showed normal LV systolic function with moderately dilated/mildly dysfunctional RV and PASP 75 mmHg.  In 12/21, he was admitted with PNA and treated with antibiotics.  CTA chest showed no PE, RML PNA, and bilateral hilar + mediastinal lymphadenopathy.  He was sent home from this admission on home oxygen .   In 1/22, RHC/LHC was done.  This showed patent LIMA-LAD and SVG-D, occluded SVG-OM and occluded SVG-RCA, 95% calcified proximal RCA stenosis, totally occluded PLV with collaterals (known from prior).  There was moderate pulmonary hypertension with optimized left and right heart filling pressures.  I reviewed the cath with interventional cardiology, would require atherectomy to treat the RCA.  We decided to manage medically initially.   He stopped Jardiance  due to penile yeast infection.   Echo 4/23 showed EF 55-60%, mild LVH, RV mildly reduced.   Admitted 10/23 with hematuria and inability to urinate. Urology consulted, foley placed and discharged home. Seen in the ED a week later with hematuria. Urology consulted and arranged traction on foley to help assist with tamponade.   Echo 3/24 showed EF 60-65% with mildly D-shaped septum, mild RV dilation with mildly decreased systolic function, IVC dilated, PASP 45 mmHg.    Seen in ED 07/10/22 with CP. HR 30 when EMS arrived, asymptomatic. He was given IV atropine and 200 cc IVF bolus. HsTroponins x 3 negative, HR 50's. Live Zio 2 week placed 4/24.  Zio (5/24) showed continuous atrial fibrillation, average HR 60 bpm. Rare PVCs.  6/24 underwent EVAR for AAA with repair of right common femoral artery. Discharged to SNF.  Discharged to home from [facility] 10/10/22.   Today he returns for HF follow up. Main complaint is urinary incontinence.  He wants to cut back on his torsemide .  He rarely uses oxygen  now, only at night.  No exertional chest pain.  He does have rare atypical chest pain. No dyspnea with usual activities but limited by low back pain.  He uses a cane, walker, or scooter.  No orthopnea/PND. BP elevated.   ECG (personally reviewed): atrial fibrillation rate 62  Labs (12/21): K 3.8, creatinine 0.83, hgb 9.5 Labs (1/22): K 3.8, creatinine 1.22, LDL 49 Labs (2/22): K 3.6, creatinine 0.86 Labs (10/22): K 3.7, creatinine 0.75, BNP 153 Labs (1/23): LDL 62, HDL 37 Labs (9/23): K 3.6, creatinine 0.9, hgb 11.4 Labs (10/23): K 3.5, creatinine 0.81 Labs (2/24): K 3.8, creatinine 1.02 Labs (3/24): K 3.6, creatinine 1.07 Labs (7/24): K 5.1, creatinine 2, Iron sat 9%  Labs (8/24): K 4.3, creatinine 1.19, BNP 223 Labs (3/25): K 4.5, creatinine 0.96  PMH: 1. Atrial fibrillation: Permanent.  2. CAD: CABG 2000.  - LHC (2014) with totally occluded RCA, totally occluded SVG-RCA, totally occluded SVG-OM, atretic LIMA, patent SVG-D.   - LHC (1/22): patent LIMA-LAD and SVG-D, occluded SVG-OM  and occluded SVG-RCA, 60-70% calcified ostial RCA stenosis and 95% calcified proximal RCA stenosis, totally occluded PLV with collaterals (known from prior). 3. HTN 4. PAD: s/p bilateral fem-pop bypasses.   - Peripheral arterial dopplers (7/21): Occluded right fem-pop, patent left fem-pop.  - ABIs (3/25): Normal 5. Type 2 diabetes 6. Prostate cancer: Treated with radiation. 7.  PVCs 8. History of renal artery stenosis.  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  10. Chronic diastolic CHF: Echo (10/21) with EF 60-65%, mildly decreased RV systolic function with moderate RV enlargement, severe biatrial enlargement, PASP 75 mmHg, dilated IVC.  V/Q scan 7/22 with no chronic PE.  - RHC (1/22): mean RA 6, PA 65/18 mean 35, mean PCWP 14, CI 2.71, PVR 3.7 WU.  - Echo (4/23): EF 55-60%, mild LVH, RV mildly reduced, severely elevated pulmonary artery systolic pressure - Echo (3/24): EF 60-65%, D-shaped septum, mild RV enlargement and mildly decreased RV systolic function, PASP 45 11. 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  - Abdominal US  (8/22): 4.8 cm AAA - CT abd/pelvis (5/23): 4.8 cm AAA - CT abd/pelvis (2/24): 5 cm AAA - EVAR for AAA in 6/24 12. Carotid stenosis: Carotid dopplers (8/22) with 40-59% LICA stenosis.  - Carotid dopplers (9/23): 40-59% LICA stenosis.  - Carotid dopplers (3/25): 40-59% LICA stenosis 13. 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  Retired.  Remote smoker (>20 years ago). No ETOH.   Family History  Problem Relation Age of Onset   Deep vein thrombosis Father    Lung cancer Sister    Diabetes Sister    Heart disease Sister        After age 72   Hyperlipidemia Sister    Hypertension Sister    Lung cancer Sister    Breast cancer Sister    Hypertension Mother    Diabetes Sister    Coronary artery disease Other        family hx of   Cancer Brother        "crab cancer"   Heart disease Brother    Heart attack Brother    Heart attack Daughter    Colon cancer Neg Hx    Stroke Neg Hx    ROS: All systems reviewed and negative except as per HPI.   Current Outpatient Medications  Medication Sig Dispense Refill   acetaminophen  (TYLENOL ) 500 MG tablet Take 500-1,000 mg by mouth every 6 (six) hours as needed (pain.).     albuterol  (VENTOLIN  HFA) 108 (90 Base) MCG/ACT inhaler Inhale 2 puffs into the lungs every 6  (six) hours as needed for wheezing or shortness of breath. 1 each 2   aspirin  81 MG EC tablet Take 1 tablet (81 mg total) by mouth daily with breakfast. 30 tablet 3   atorvastatin  (LIPITOR) 40 MG tablet Take 1 tablet (40 mg total) by mouth daily. 90 tablet 1   Cholecalciferol (VITAMIN D3) 50 MCG (2000 UT) capsule Take 2,000 Units by mouth daily.     dutasteride  (AVODART ) 0.5 MG capsule Take 1 capsule (0.5 mg total) by mouth daily. 30 capsule 11   HYDROcodone -acetaminophen  (NORCO) 10-325 MG tablet Take 1 tablet by mouth 2 (two) times daily as needed (pain.).     Incontinence Supplies (BARD [place] INCONTIN CLAMP) MISC 1 Units by Does not apply route daily. 1 each 0   ipratropium (ATROVENT ) 0.03 % nasal spray Place 2 sprays into both nostrils 2 (two) times daily as needed for rhinitis.  isosorbide  mononitrate (IMDUR ) 60 MG 24 hr tablet Take 2 tablets (120 mg total) by mouth every evening. 180 tablet 3   latanoprost  (XALATAN ) 0.005 % ophthalmic solution Place 1 drop into both eyes at bedtime.     levocetirizine (XYZAL) 5 MG tablet Take 5 mg by mouth every evening.     metFORMIN  (GLUCOPHAGE ) 500 MG tablet Take 500 mg by mouth 2 (two) times daily with a meal.     nitrofurantoin  (MACRODANTIN ) 50 MG capsule Take 1 capsule (50 mg total) by mouth at bedtime. 30 capsule 5   nitroGLYCERIN  (NITROSTAT ) 0.4 MG SL tablet Place 1 tablet (0.4 mg total) under the tongue every 5 (five) minutes x 3 doses as needed for chest pain. 25 tablet 3   OXYGEN  Inhale 3 L into the lungs at bedtime.     pantoprazole  (PROTONIX ) 40 MG tablet Take 1 tablet (40 mg total) by mouth daily. Take 30-60 min before first meal of the day     polyethylene glycol powder (GLYCOLAX /MIRALAX ) 17 GM/SCOOP powder Take 8.5 g by mouth daily.     spironolactone  (ALDACTONE ) 25 MG tablet TAKE 1 TABLET EVERY DAY 90 tablet 3   triamcinolone cream (KENALOG) 0.1 % Apply 1 Application topically 2 (two) times daily as needed (skin irritation.).      warfarin (COUMADIN ) 2 MG tablet Take 2 mg by mouth every evening.     losartan  (COZAAR ) 100 MG tablet Take 1 tablet (100 mg total) by mouth daily. 90 tablet 3   torsemide  (DEMADEX ) 10 MG tablet Take 10 mg every 4 days     No current facility-administered medications for this encounter.   Wt Readings from Last 3 Encounters:  07/24/23 91.2 kg (201 lb)  06/13/23 94.3 kg (207 lb 14.3 oz)  06/10/23 94.3 kg (208 lb)   BP (!) 144/80   Pulse (!) 59   Wt 91.2 kg (201 lb)   SpO2 97%   BMI 28.43 kg/m   Physical Exam: General: NAD Neck: No JVD, no thyromegaly or thyroid  nodule.  Lungs: Clear to auscultation bilaterally with normal respiratory effort. CV: Nondisplaced PMI.  Heart irregular S1/S2, no S3/S4, no murmur.  No peripheral edema.  No carotid bruit.  Unable to palpate pedal pulses.  Abdomen: Soft, nontender, no hepatosplenomegaly, no distention.  Skin: Intact without lesions or rashes.  Neurologic: Alert and oriented x 3.  Psych: Normal affect. Extremities: No clubbing or cyanosis.  HEENT: Normal.   Assessment/Plan: 1. Chronic diastolic CHF: Echo in 10/21 with EF 60-65%, mildly decreased RV systolic function with moderate RV enlargement, severe biatrial enlargement, PASP 75 mmHg, dilated IVC. After increasing diuretics, RHC in 1/22 showed normal filling pressures with moderate pulmonary hypertension. Echo 4/23 with EF 60-65%, mildly enlarged RV with mildly decreased RV function. Echo 3/24 showed EF 60-65% with mildly D-shaped septum, mild RV dilation with mildly decreased systolic function, IVC dilated, PASP 45 mmHg. NYHA class II, but not very active due to back pain and deconditioning.  He is not volume overloaded on exam.  - He is taking torsemide  10 mg q3 days but wants to cut back due to his problems with incontinence.  I will let him slowly decrease torsemide  to see how he tolerates it.  Decrease torsemide  to 10 mg every 4th day, he will need to cut back on sodium.  - Continue spiro  25 mg daily. - No SGLT2 inhibitor with incontinence, UTIs, yeast infections.   2. 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  Moderate PH on RHC  in 1/22.  Suspect group 3 PH in setting of scarring from recurrent aspiration PNA.  Less likely group 1 PH.  No chronic PEs on 7/22 V/Q scan. 3. Chronic hypoxemic respiratory failure: Remote smoker.  Hilar and mediastinal lymphadenopathy on chest CT, no reported ILD.  Recurrent aspiration PNA in setting of chronic right vocal cord paralysis. He is now only using oxygen  at night.    - Should follow with pulmonary.  4. CAD: s/p CABG in 2000.  Cath in 1/22 showed patent LIMA-LAD and SVG-D but occluded SVG-OM and SVG-RCA.  There was 60-70% ostial RCA stenosis and 95% proximal RCA stenosis with heavy calcification.  Discussed with interventional, with normal EF plan was to proceed with initial medical management and proceed to PCI if chest pain worsened (PCI would require atherectomy). No exertional chest pain. - Continue ASA 81 and atorvastatin , check lipids today.  - Continue Imdur  120 mg daily for angina control.  - Off Toprol  XL with bradycardia. 5. Atrial fibrillation: Permanent. Rate control is reasonable without nodal blocking agent.  - On warfarin. PCP manages INR. 6. PAD: Occluded right fem-pop bypass. No claudication.  ABIs in 3/25 were normal.  - Follows with [facility]. 7. Carotid stenosis: 40-59% LICA stenosis on 3/25 dopplers, followed at [facility].  8. AAA: 5 cm AAA on 2/24 abdominal CT.  EVAR 6/24.  9. Incontinence: Follows closely with urology. On tamsulosin .   10. Bradycardia: Given IV atropine with HR 30s in 4/24; he was asymptomatic.  Now off beta blocker.  Zio (5/24) showed continuous atrial fibrillation, average HR 60 bpm. Rare PVCs.  No lightheadedness.  11. 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  12. HTN: BP elevated.  - Increase losartan  to 50 mg daily.  BMET 10 days.   Follow up 2 months with APP.   I spent 32 minutes reviewing records,  interviewing/examining patient, and managing orders.    Troy [name]  07/24/2023

## 2023-07-24 NOTE — Patient Instructions (Signed)
 INCREASE Losartan  to 100 mg daily.  CHANGE Torsemide  to  10 mg every 4 days  Labs done today, your results will be available in MyChart, we will contact you for abnormal readings.  Repeat blood work in 10 days.  Your physician recommends that you schedule a follow-up appointment in: 2 months.  If you have any questions or concerns before your next appointment please send us  a message through [facility] or call our office at [phone].    TO LEAVE A MESSAGE FOR THE NURSE SELECT OPTION 2, PLEASE LEAVE A MESSAGE INCLUDING: YOUR NAME DATE OF BIRTH CALL BACK NUMBER REASON FOR CALL**this is important as we prioritize the call backs  YOU WILL RECEIVE A CALL BACK THE SAME DAY AS LONG AS YOU CALL BEFORE 4:00 PM  At the [facility], you and your health needs are our priority. As part of our continuing mission to provide you with exceptional heart care, we have created designated Provider Care Teams. These Care Teams include your primary Cardiologist (physician) and Advanced Practice Providers (APPs- Physician Assistants and Nurse Practitioners) who all work together to provide you with the care you need, when you need it.   You may see any of the following providers on your designated Care Team at your next follow up: Dr Jules Oar Dr Peder Bourdon Dr. Alwin Baars Dr. Arta Lark Amy Marijane Shoulders, NP Ruddy Corral, [place] [facility] [place], [place] Dennise Fitz, NP [place] Lee, NP Shawnee Dellen, NP Luster Salters, PharmD Bevely Brush, PharmD   Please be sure to bring in all your medications bottles to every appointment.    Thank you for choosing [facility]

## 2023-07-26 ENCOUNTER — Telehealth (HOSPITAL_COMMUNITY): Payer: Self-pay

## 2023-07-26 DIAGNOSIS — I2510 Atherosclerotic heart disease of native coronary artery without angina pectoris: Secondary | ICD-10-CM

## 2023-07-26 MED ORDER — ATORVASTATIN CALCIUM 40 MG PO TABS: 80.0000 mg | ORAL_TABLET | Freq: Every day | ORAL | Status: DC

## 2023-07-26 NOTE — Telephone Encounter (Signed)
-----   Message from [employer] sent at 07/24/2023  4:12 PM EDT ----- Goal LDL < 55, increase atorvastatin  to 80 mg daily with lipids/LFTs in 2 months.

## 2023-07-26 NOTE — Telephone Encounter (Signed)
 Spoke with patient regarding the following results. Patient made aware and patient verbalized understanding.   Patient reports he has enough atorvastatin  to double up (40mg  tablets)  he will take 2. Will let us  know when refill needed. Medication list updated.   Patient will also go in 2 months to [facility] for blood work- orders entered.   Advised patient to call back to office with any issues, questions, or concerns. Patient verbalized understanding.

## 2023-07-29 ENCOUNTER — Other Ambulatory Visit (HOSPITAL_COMMUNITY): Payer: Self-pay

## 2023-07-29 MED ORDER — ISOSORBIDE MONONITRATE ER 60 MG PO TB24
120.0000 mg | ORAL_TABLET | Freq: Every evening | ORAL | 3 refills | Status: DC
Start: 2023-07-29 — End: 2023-08-16

## 2023-08-06 ENCOUNTER — Other Ambulatory Visit (HOSPITAL_COMMUNITY): Payer: Self-pay | Admitting: Cardiology

## 2023-08-06 DIAGNOSIS — Z79899 Other long term (current) drug therapy: Secondary | ICD-10-CM | POA: Diagnosis not present

## 2023-08-06 DIAGNOSIS — I5032 Chronic diastolic (congestive) heart failure: Secondary | ICD-10-CM

## 2023-08-07 ENCOUNTER — Ambulatory Visit: Admitting: Urology

## 2023-08-07 VITALS — BP 159/64 | HR 63

## 2023-08-07 DIAGNOSIS — N3281 Overactive bladder: Secondary | ICD-10-CM | POA: Diagnosis not present

## 2023-08-07 DIAGNOSIS — R32 Unspecified urinary incontinence: Secondary | ICD-10-CM | POA: Diagnosis not present

## 2023-08-07 LAB — BASIC METABOLIC PANEL WITH GFR
BUN/Creatinine Ratio: 14 (ref 10–24)
BUN: 17 mg/dL (ref 8–27)
CO2: 25 mmol/L (ref 20–29)
Calcium: 9.3 mg/dL (ref 8.6–10.2)
Chloride: 101 mmol/L (ref 96–106)
Creatinine, Ser: 1.24 mg/dL (ref 0.76–1.27)
Glucose: 88 mg/dL (ref 70–99)
Potassium: 4.7 mmol/L (ref 3.5–5.2)
Sodium: 141 mmol/L (ref 134–144)
eGFR: 59 mL/min/{1.73_m2} — ABNORMAL LOW (ref >59)

## 2023-08-07 NOTE — Progress Notes (Signed)
 08/07/2023 2:19 PM   Troy [name] 1940/12/16 [id number]  Referring provider: Daiva Duane, PA-C [address] [place],  [place] [address]  Followup urinary incontinence.    HPI: [name] is a 82yo here for followup for urinary incontinence. He notes improvement in his incontinence since intravesical botox . He now does not get up at night to urinate. He uses 2-3 pads per day depending if he takes torsemide .    PMH: Past Medical History:  Diagnosis Date   A-fib [facility])    AAA (abdominal aortic aneurysm) (HCC)    Followed by Dr. Ena Harries Early   Arthritis    CAD (coronary artery disease)    a. CABG 2000.   Cancer [facility])    Prostate:  Radiation Tx   Carotid artery disease (HCC)    Chest pain    precordial. mild chronic .Aaron Aas... nonischemic   CHF (congestive heart failure) (HCC)    Chronic edema    Coronary artery disease    a. Nuclear, January, 2008, no ischemia b. 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 Shirley Douglas   Sinus bradycardia    Asymptomatic    Surgical History: Past Surgical History:  Procedure Laterality Date   ABDOMINAL AORTIC ENDOVASCULAR STENT GRAFT N/A 09/26/2022   Procedure: ABDOMINAL AORTIC ENDOVASCULAR STENT GRAFT;  Surgeon: Young Hensen, MD;  Location: [facility] OR;  Service: Vascular;  Laterality: N/A;   AORTOGRAM  09/26/2022   Procedure: AORTOGRAM;  Surgeon: Young Hensen, MD;  Location: [facility] OR;  Service:  Vascular;;   BALLOON DILATION  03/14/2023   Procedure: Laurie Poplar;  Surgeon: Hargis Lias, MD;  [facility];  Service: Endoscopy;;   BIOPSY  03/14/2023   Procedure: BIOPSY;  Surgeon: Hargis Lias, MD;  [facility];  Service: Endoscopy;;   CARDIAC CATHETERIZATION  09/26/2012   1/4 patent bypass (occluded SVG-PDA, SVG-OM, LIMA-LAD), SVG-diagonal patent and fills the diagonal and LAD, distal RCA occlusion with left to right collateralization, patent circumflex, LAD with no flow-limiting disease and antegrade flow competitively from SVG-diagonal; EF 60-65%   COLONOSCOPY  11/30/2009   AOZ:HYQMVHQIONGEXB. next TCS 11/2019   COLONOSCOPY WITH PROPOFOL  N/A 12/18/2019   Procedure: COLONOSCOPY WITH PROPOFOL ;  Surgeon: Urban Garden, MD;  [facility];  Service: Gastroenterology;  Laterality: N/A;  1030   COLONOSCOPY WITH PROPOFOL  N/A 03/14/2023   Procedure: COLONOSCOPY WITH PROPOFOL ;  Surgeon: Hargis Lias, MD;  [facility];  Service: Endoscopy;  Laterality: N/A;  12:45pm;asa 3   CORONARY ARTERY BYPASS GRAFT  2000   CYSTOSCOPY N/A 08/31/2021   Procedure: CYSTOSCOPY;  Surgeon: Marco Severs, MD;  Location: [facility];  Service: Urology;  Laterality: N/A;  pt knows to arrive at 7:00   CYSTOSCOPY WITH FULGERATION N/A 03/22/2022   Procedure: CYSTOSCOPY  WITH FULGERATION;  Surgeon: Marco Severs, MD;  Location: [facility];  Service: Urology;  Laterality: N/A;   CYSTOSCOPY WITH INJECTION N/A 06/13/2023   Procedure: CYSTOSCOPY WITH INJECTION- Botox  100 units;  Surgeon: Marco Severs, MD;  Location: [facility];  Service: Urology;  Laterality: N/A;  pt will arrive between 8:30 - 9:00   ENDARTERECTOMY FEMORAL Right 09/26/2022   Procedure: RIGHT COMMON FEMORAL ENDARTERECTOMY WITH BOVINE PATCH;  Surgeon: Young Hensen, MD;  Location: [facility] OR;  Service: Vascular;  Laterality: Right;   ESOPHAGOGASTRODUODENOSCOPY N/A 08/12/2013    GUY:[id number] peptic stricture with erosive refluxesophagitis - status post Maloney dilation. Hiatal hernia. Abnormalgastric mucosa. Deformity of the pyloric channel suggestive ofprior peptic ulcer disease. Duodenal bulbar diverticulum Statuspost gastric biopsy. h.pylori   ESOPHAGOGASTRODUODENOSCOPY (EGD) WITH PROPOFOL  N/A 03/14/2023   Procedure: ESOPHAGOGASTRODUODENOSCOPY (EGD) WITH PROPOFOL ;  Surgeon: Hargis Lias, MD;  [facility];  Service: Endoscopy;  Laterality: N/A;  12:45pm;asa 3   LEFT HEART CATHETERIZATION WITH CORONARY/GRAFT ANGIOGRAM N/A 09/26/2012   Procedure: LEFT HEART CATHETERIZATION WITH Estella Helling;  Surgeon: Odie Benne, MD;  Location: [facility] CATH LAB;  Service: Cardiovascular;  Laterality: N/A;   MALONEY DILATION N/A 08/12/2013   Procedure: Londa Rival DILATION;  Surgeon: Suzette Espy, MD;  [facility];  Service: Endoscopy;  Laterality: N/A;   POLYPECTOMY  12/18/2019   Procedure: POLYPECTOMY;  Surgeon: Urban Garden, MD;  [facility];  Service: Gastroenterology;;  ascending colon polyp    POLYPECTOMY  03/14/2023   Procedure: POLYPECTOMY INTESTINAL;  Surgeon: Hargis Lias, MD;  [facility];  Service: Endoscopy;;   PR VEIN BYPASS GRAFT,AORTO-FEM-POP Right 07/19/1999   PR VEIN BYPASS GRAFT,AORTO-FEM-POP Left 05/02/2006   RIGHT HEART CATH AND CORONARY/GRAFT ANGIOGRAPHY N/A 04/11/2020   Procedure: RIGHT HEART CATH AND CORONARY/GRAFT ANGIOGRAPHY;  Surgeon: Darlis Eisenmenger, MD;  Location: [facility] CV LAB;  Service: Cardiovascular;  Laterality: N/A;   SAVORY DILATION N/A 08/12/2013   Procedure: SAVORY DILATION;  Surgeon: Suzette Espy, MD;  [facility];  Service: Endoscopy;  Laterality: N/A;   TRANSURETHRAL RESECTION OF BLADDER TUMOR N/A 08/31/2021   Procedure: TRANSURETHRAL RESECTION OF BLADDER TUMOR (TURBT);  Surgeon: Marco Severs, MD;  Location: [facility];  Service:  Urology;  Laterality: N/A;   ULTRASOUND GUIDANCE FOR VASCULAR ACCESS Bilateral 09/26/2022   Procedure: ULTRASOUND GUIDANCE FOR VASCULAR ACCESS;  Surgeon: Young Hensen, MD;  Location[facility] OR;  Service: Vascular;  Laterality: Bilateral;   VASCULAR SURGERY     WRIST SURGERY     cyst removal    Home Medications:  Allergies as of 08/07/2023       Reactions   Niacin Itching, Rash   Burning sensation   Contrast Media [iodinated Contrast Media] Nausea And Vomiting   Per patient vocal cords thick with increased phlegm    Gemtesa  [vibegron ] Other (See Comments)   UTI    Iodine -131 Nausea And Vomiting   Jardiance  [empagliflozin ] Other (See Comments)   UTI   Myrbetriq  [mirabegron ] Other (See Comments)   UTI   Nsaids Other (See Comments)   Taking Coumadin         Medication List        Accurate as of Aug 07, 2023  2:19 PM. If you have any questions, ask your nurse or doctor.          acetaminophen  500 MG tablet Commonly known as: TYLENOL  Take 500-1,000 mg by mouth every 6 (six)  hours as needed (pain.).   albuterol  108 (90 Base) MCG/ACT inhaler Commonly known as: VENTOLIN  HFA Inhale 2 puffs into the lungs every 6 (six) hours as needed for wheezing or shortness of breath.   aspirin  EC 81 MG tablet Take 1 tablet (81 mg total) by mouth daily with breakfast.   atorvastatin  40 MG tablet Commonly known as: LIPITOR Take 2 tablets (80 mg total) by mouth daily.   Bard Cunningham Incontin Clamp Misc 1 Units by Does not apply route daily.   dutasteride  0.5 MG capsule Commonly known as: AVODART  Take 1 capsule (0.5 mg total) by mouth daily.   HYDROcodone -acetaminophen  10-325 MG tablet Commonly known as: NORCO Take 1 tablet by mouth 2 (two) times daily as needed (pain.).   ipratropium 0.03 % nasal spray Commonly known as: ATROVENT  Place 2 sprays into both nostrils 2 (two) times daily as needed for rhinitis.   isosorbide  mononitrate 60 MG 24 hr tablet Commonly known as:  IMDUR  Take 2 tablets (120 mg total) by mouth every evening.   latanoprost  0.005 % ophthalmic solution Commonly known as: XALATAN  Place 1 drop into both eyes at bedtime.   levocetirizine 5 MG tablet Commonly known as: XYZAL Take 5 mg by mouth every evening.   losartan  100 MG tablet Commonly known as: COZAAR  Take 1 tablet (100 mg total) by mouth daily.   metFORMIN  500 MG tablet Commonly known as: GLUCOPHAGE  Take 500 mg by mouth 2 (two) times daily with a meal.   nitrofurantoin  50 MG capsule Commonly known as: Macrodantin  Take 1 capsule (50 mg total) by mouth at bedtime.   nitroGLYCERIN  0.4 MG SL tablet Commonly known as: NITROSTAT  Place 1 tablet (0.4 mg total) under the tongue every 5 (five) minutes x 3 doses as needed for chest pain.   OXYGEN  Inhale 3 L into the lungs at bedtime.   pantoprazole  40 MG tablet Commonly known as: PROTONIX  Take 1 tablet (40 mg total) by mouth daily. Take 30-60 min before first meal of the day   polyethylene glycol powder 17 GM/SCOOP powder Commonly known as: GLYCOLAX /MIRALAX  Take 8.5 g by mouth daily.   spironolactone  25 MG tablet Commonly known as: ALDACTONE  TAKE 1 TABLET EVERY DAY   torsemide  10 MG tablet Commonly known as: DEMADEX  Take 10 mg every 4 days   triamcinolone cream 0.1 % Commonly known as: KENALOG Apply 1 Application topically 2 (two) times daily as needed (skin irritation.).   Vitamin D3 50 MCG (2000 UT) capsule Take 2,000 Units by mouth daily.   warfarin 2 MG tablet Commonly known as: COUMADIN  Take 2 mg by mouth every evening.        Allergies:  Allergies  Allergen Reactions   Niacin Itching and Rash    Burning sensation   Contrast Media [Iodinated Contrast Media] Nausea And Vomiting    Per patient vocal cords thick with increased phlegm    Gemtesa  [Vibegron ] Other (See Comments)    UTI    Iodine -131 Nausea And Vomiting   Jardiance  [Empagliflozin ] Other (See Comments)    UTI   Myrbetriq  [Mirabegron ]  Other (See Comments)    UTI   Nsaids Other (See Comments)    Taking Coumadin     Family History: Family History  Problem Relation Age of Onset   Deep vein thrombosis Father    Lung cancer Sister    Diabetes Sister    Heart disease Sister        After age 54   Hyperlipidemia Sister    Hypertension  Sister    Lung cancer Sister    Breast cancer Sister    Hypertension Mother    Diabetes Sister    Coronary artery disease Other        family hx of   Cancer Brother        "crab cancer"   Heart disease Brother    Heart attack Brother    Heart attack Daughter    Colon cancer Neg Hx    Stroke Neg Hx     Social History:  reports that he quit smoking about 5 years ago. His smoking use included cigarettes. He started smoking about 75 years ago. He has a 140 pack-year smoking history. He has never used smokeless tobacco. He reports current alcohol use. He reports that he does not use drugs.  ROS: All other review of systems were reviewed and are negative except what is noted above in HPI  Physical Exam: BP (!) 159/64   Pulse 63   Constitutional:  Alert and oriented, No acute distress. HEENT: [place] AT, moist mucus membranes.  Trachea midline, no masses. Cardiovascular: No clubbing, cyanosis, or edema. Respiratory: Normal respiratory effort, no increased work of breathing. GI: Abdomen is soft, nontender, nondistended, no abdominal masses GU: No CVA tenderness.  Lymph: No cervical or inguinal lymphadenopathy. Skin: No rashes, bruises or suspicious lesions. Neurologic: Grossly intact, no focal deficits, moving all 4 extremities. Psychiatric: Normal mood and affect.  Laboratory Data: Lab Results  Component Value Date   WBC 8.3 12/11/2022   HGB 12.2 (L) 12/11/2022   HCT 39.9 12/11/2022   MCV 89.9 12/11/2022   PLT 241 12/11/2022    Lab Results  Component Value Date   CREATININE 1.24 08/06/2023    No results found for: "PSA"  No results found for: "TESTOSTERONE"  Lab  Results  Component Value Date   HGBA1C 6.8 (H) 06/10/2023    Urinalysis    Component Value Date/Time   COLORURINE YELLOW 09/30/2022 1810   APPEARANCEUR Clear 06/24/2023 1459   LABSPEC 1.006 09/30/2022 1810   PHURINE 7.0 09/30/2022 1810   GLUCOSEU Negative 06/24/2023 1459   HGBUR SMALL (A) 09/30/2022 1810   BILIRUBINUR Negative 06/24/2023 1459   KETONESUR NEGATIVE 09/30/2022 1810   PROTEINUR 1+ (A) 06/24/2023 1459   PROTEINUR NEGATIVE 09/30/2022 1810   NITRITE Negative 06/24/2023 1459   NITRITE NEGATIVE 09/30/2022 1810   LEUKOCYTESUR 2+ (A) 06/24/2023 1459   LEUKOCYTESUR NEGATIVE 09/30/2022 1810    Lab Results  Component Value Date   LABMICR See below: 06/24/2023   WBCUA >30 (A) 06/24/2023   LABEPIT 0-10 06/24/2023   MUCUS Present (A) 01/16/2023   BACTERIA Few (A) 06/24/2023    Pertinent Imaging: *** No results found for this or any previous visit.  No results found for this or any previous visit.  No results found for this or any previous visit.  No results found for this or any previous visit.  No results found for this or any previous visit.  No results found for this or any previous visit.  Results for orders placed during the hospital encounter of 08/18/21  CT HEMATURIA WORKUP  Narrative CLINICAL DATA:  Gross hematuria since early May, history of prostate cancer * Tracking Code: BO *  EXAM: CT ABDOMEN AND PELVIS WITHOUT AND WITH CONTRAST  TECHNIQUE: Multidetector CT imaging of the abdomen and pelvis was performed following the standard protocol before and following the bolus administration of intravenous contrast.  RADIATION DOSE REDUCTION: This exam was performed according to  the departmental dose-optimization program which includes automated exposure control, adjustment of the mA and/or kV according to patient size and/or use of iterative reconstruction technique.  CONTRAST:  100mL OMNIPAQUE  IOHEXOL  300 MG/ML  SOLN  COMPARISON:  None  Available.  FINDINGS: Lower chest: No acute abnormality. Cardiomegaly. Coronary artery calcifications.  Hepatobiliary: No solid liver abnormality is seen. Small, definitively benign cyst or hemangioma of the central liver dome for which no further follow-up or characterization is required (series 15, image 30). No gallstones, gallbladder wall thickening, or biliary dilatation.  Pancreas: Unremarkable. No pancreatic ductal dilatation or surrounding inflammatory changes.  Spleen: Normal in size without significant abnormality.  Adrenals/Urinary Tract: Adrenal glands are unremarkable. Small simple, benign bilateral renal cortical cysts, as well as intrinsically hyperdense, nonenhancing hemorrhagic or proteinaceous cysts of the midportions of the left and right kidneys (series 2, image 37). No urinary tract filling defect on delayed phase imaging. Bladder is unremarkable.  Stomach/Bowel: Stomach is within normal limits. Appendix appears normal. No evidence of bowel wall thickening, distention, or inflammatory changes. Descending and sigmoid diverticulosis.  Vascular/Lymphatic: Aortic atherosclerosis. 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 No enlarged abdominal or pelvic lymph nodes.  Reproductive: Prostate brachytherapy.  Other: Small, fat containing bilateral inguinal hernias. No ascites.  Musculoskeletal: No acute or significant osseous findings.  IMPRESSION: 1. No evidence of urinary tract mass, calculus, or hydronephrosis. No urinary tract filling defect on delayed phase imaging. 2. Prostate brachytherapy. No evidence of mass or lymphadenopathy in the abdomen or pelvis. 3. Small simple, benign  bilateral renal cortical cysts, as well as nonenhancing, benign hemorrhagic or proteinaceous cysts. No further follow-up or characterization is required for these benign renal cysts. 4. 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 Recommend referral to a vascular specialist. This recommendation follows ACR consensus guidelines: White Paper of the ACR Incidental Findings Committee II on Vascular Findings. J Am Coll Radiol 2013; 10:789-794. 5. Cardiomegaly and coronary artery disease.  Aortic Atherosclerosis (ICD10-I70.0).   Electronically Signed By: Fredricka Jenny M.D. On: 08/19/2021 11:29  No results found for this or any previous visit.   Assessment & Plan:    1. Overactive bladder (Primary) -followup 4 months for intravesical botox  200 units   No follow-ups on file.  Johnie Nailer, MD  [facility] [place]

## 2023-08-07 NOTE — Patient Instructions (Signed)

## 2023-08-13 ENCOUNTER — Encounter: Payer: Self-pay | Admitting: Urology

## 2023-08-16 MED ORDER — ISOSORBIDE MONONITRATE ER 60 MG PO TB24: 120.0000 mg | ORAL_TABLET | Freq: Every evening | ORAL | 3 refills | Status: AC

## 2023-08-16 NOTE — Addendum Note (Signed)
 Addended by: Edris Gowers on: 08/16/2023 01:25 PM   Modules accepted: Orders

## 2023-08-20 ENCOUNTER — Other Ambulatory Visit (HOSPITAL_COMMUNITY): Payer: Self-pay | Admitting: Cardiology

## 2023-08-29 ENCOUNTER — Other Ambulatory Visit: Payer: Self-pay

## 2023-08-29 DIAGNOSIS — N453 Epididymo-orchitis: Secondary | ICD-10-CM

## 2023-08-29 MED ORDER — NITROFURANTOIN MACROCRYSTAL 50 MG PO CAPS
50.0000 mg | ORAL_CAPSULE | Freq: Every day | ORAL | 5 refills | Status: DC
Start: 2023-08-29 — End: 2024-02-19

## 2023-09-05 ENCOUNTER — Telehealth: Payer: Self-pay

## 2023-09-05 NOTE — Telephone Encounter (Signed)
 Patient's is requesting a RX for finasteride 5 mg. Patient is aware a message will be sent to MD for approval, voiced understanding.

## 2023-09-06 MED ORDER — FINASTERIDE 5 MG PO TABS: 5.0000 mg | ORAL_TABLET | Freq: Every day | ORAL | 3 refills | Status: AC

## 2023-09-06 MED ORDER — FINASTERIDE 5 MG PO TABS: 5.0000 mg | ORAL_TABLET | Freq: Every day | ORAL | 3 refills | Status: DC

## 2023-09-06 NOTE — Addendum Note (Signed)
 Addended by: Buddie Carina R on: 09/06/2023 02:05 PM   Modules accepted: Orders

## 2023-09-06 NOTE — Telephone Encounter (Addendum)
 Finasteride 5 mg Rx sent to pharmacy per Dr. Claretta Croft. Patient is made aware and voiced understanding.

## 2023-09-19 ENCOUNTER — Other Ambulatory Visit (HOSPITAL_COMMUNITY): Payer: Self-pay | Admitting: *Deleted

## 2023-09-19 DIAGNOSIS — I2510 Atherosclerotic heart disease of native coronary artery without angina pectoris: Secondary | ICD-10-CM | POA: Diagnosis not present

## 2023-09-20 LAB — LIPID PANEL
Chol/HDL Ratio: 4 ratio (ref 0.0–5.0)
Cholesterol, Total: 107 mg/dL (ref 100–199)
HDL: 27 mg/dL — ABNORMAL LOW (ref >39)
LDL Chol Calc (NIH): 59 mg/dL (ref 0–99)
Triglycerides: 115 mg/dL (ref 0–149)
VLDL Cholesterol Cal: 21 mg/dL (ref 5–40)

## 2023-09-20 LAB — HEPATIC FUNCTION PANEL
ALT: 9 IU/L (ref 0–44)
AST: 20 IU/L (ref 0–40)
Albumin: 4.2 g/dL (ref 3.8–4.8)
Alkaline Phosphatase: 71 IU/L (ref 44–121)
Bilirubin Total: 0.6 mg/dL (ref 0.0–1.2)
Bilirubin, Direct: 0.25 mg/dL (ref 0.00–0.40)
Total Protein: 6.6 g/dL (ref 6.0–8.5)

## 2023-09-23 NOTE — Progress Notes (Signed)
 ADVANCED HF CLINIC NOTE  Primary Care: [name] Cardiology: Dr. Rolan  HPI: Troy [name] is a 83 y.o. male patient with history of permanent atrial fibrillation, CAD s/p CABG 2000, PAD, chronic diastolic CHF was referred to CHF clinic by [name], PA, for evaluation of CHF and pulmonary hypertension.  Patient had CABG in 2000, last cath was in 2014 showing occluded SVG-OM, occluded SVG-PDA, occluded RCA, and atretic LIMA.  SVG-D was patent and native LAD was patent.  Patient had bilateral fem-pop bypasses, peripheral dopplers in 7/21 showed that the right fem-pop was occluded.  He is in permanent atrial fibrillation on warfarin. Echo in 10/21 showed normal LV systolic function with moderately dilated/mildly dysfunctional RV and PASP 75 mmHg.  In 12/21, he was admitted with PNA and treated with antibiotics.  CTA chest showed no PE, RML PNA, and bilateral hilar + mediastinal lymphadenopathy.  He was sent home from this admission on home oxygen .   In 1/22, RHC/LHC was done.  This showed patent LIMA-LAD and SVG-D, occluded SVG-OM and occluded SVG-RCA, 95% calcified proximal RCA stenosis, totally occluded PLV with collaterals (known from prior).  There was moderate pulmonary hypertension with optimized left and right heart filling pressures.  I reviewed the cath with interventional cardiology, would require atherectomy to treat the RCA.  We decided to manage medically initially.   He stopped Jardiance  due to penile yeast infection.   Echo 4/23 showed EF 55-60%, mild LVH, RV mildly reduced.   Admitted 10/23 with hematuria and inability to urinate. Urology consulted, foley placed and discharged home. Seen in the ED a week later with hematuria. Urology consulted and arranged traction on foley to help assist with tamponade.   Echo 3/24 showed EF 60-65% with mildly D-shaped septum, mild RV dilation with mildly decreased systolic function, IVC dilated, PASP 45 mmHg.   Seen in ED 07/10/22  with CP. HR 30 when EMS arrived, asymptomatic. He was given IV atropine and 200 cc IVF bolus. HsTroponins x 3 negative, HR 50's. Live Zio 2 week placed 4/24.  Zio (5/24) showed continuous atrial fibrillation, average HR 60 bpm. Rare PVCs.  6/24 underwent EVAR for AAA with repair of right common femoral artery. Discharged to SNF.  Discharged to home from [facility] 10/10/22.   Today he returns for HF follow up. Overall feeling fine. He has SOB doing yard work, but otherwise no dyspnea. Back pain has improved. He uses a cane, walker or scooter. Denies palpitations, abnormal bleeding, CP, dizziness, edema, or PND/Orthopnea. Appetite ok. Weight at home 205 pounds. Taking all medications. BP at home 130-160/60-70s. Frequent urination improved on cutting torsemide  back to every 4th day, did not notice increase in weight or dyspnea. Asking to cut back further.  ECG (personally reviewed): none ordered today.  Labs (8/24): K 4.3, creatinine 1.19, BNP 223 Labs (3/25): K 4.5, creatinine 0.96 Labs (4/25): LDL 71 Labs (5/25): K 4.7, creatinine 1.24 Labs (6/25): LDL 59  PMH: 1. Atrial fibrillation: Permanent.  2. CAD: CABG 2000.  - LHC (2014) with totally occluded RCA, totally occluded SVG-RCA, totally occluded SVG-OM, atretic LIMA, patent SVG-D.   - LHC (1/22): patent LIMA-LAD and SVG-D, occluded SVG-OM and occluded SVG-RCA, 60-70% calcified ostial RCA stenosis and 95% calcified proximal RCA stenosis, totally occluded PLV with collaterals (known from prior). 3. HTN 4. PAD: s/p bilateral fem-pop bypasses.   - Peripheral arterial dopplers (7/21): Occluded right fem-pop, patent left fem-pop.  - ABIs (3/25): Normal 5. Type 2 diabetes 6.  Prostate cancer: Treated with radiation. 7. PVCs 8. History of renal artery stenosis.  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  10. Chronic diastolic CHF: Echo (10/21) with EF 60-65%, mildly decreased RV systolic function with moderate RV enlargement, severe  biatrial enlargement, PASP 75 mmHg, dilated IVC.  V/Q scan 7/22 with no chronic PE.  - RHC (1/22): mean RA 6, PA 65/18 mean 35, mean PCWP 14, CI 2.71, PVR 3.7 WU.  - Echo (4/23): EF 55-60%, mild LVH, RV mildly reduced, severely elevated pulmonary artery systolic pressure - Echo (3/24): EF 60-65%, D-shaped septum, mild RV enlargement and mildly decreased RV systolic function, PASP 45 11. 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  - Abdominal US  (8/22): 4.8 cm AAA - CT abd/pelvis (5/23): 4.8 cm AAA - CT abd/pelvis (2/24): 5 cm AAA - EVAR for AAA in 6/24 12. Carotid stenosis: Carotid dopplers (8/22) with 40-59% LICA stenosis.  - Carotid dopplers (9/23): 40-59% LICA stenosis.  - Carotid dopplers (3/25): 40-59% LICA stenosis 13. 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  Retired.  Remote smoker (>20 years ago). No ETOH.   Family History  Problem Relation Age of Onset   Deep vein thrombosis Father    Lung cancer Sister    Diabetes Sister    Heart disease Sister        After age 20   Hyperlipidemia Sister    Hypertension Sister    Lung cancer Sister    Breast cancer Sister    Hypertension Mother    Diabetes Sister    Coronary artery disease Other        family hx of   Cancer Brother        crab cancer   Heart disease Brother    Heart attack Brother    Heart attack Daughter    Colon cancer Neg Hx    Stroke Neg Hx    ROS: All systems reviewed and negative except as per HPI.   Current Outpatient Medications  Medication Sig Dispense Refill   acetaminophen  (TYLENOL ) 500 MG tablet Take 500-1,000 mg by mouth every 6 (six) hours as needed (pain.).     albuterol  (VENTOLIN  HFA) 108 (90 Base) MCG/ACT inhaler Inhale 2 puffs into the lungs every 6 (six) hours as needed for wheezing or shortness of breath. 1 each 2   aspirin  81 MG EC tablet Take 1 tablet (81 mg total) by mouth daily with breakfast. 30 tablet 3   atorvastatin  (LIPITOR) 40 MG tablet Take 2 tablets (80 mg  total) by mouth daily.     Cholecalciferol (VITAMIN D3) 50 MCG (2000 UT) capsule Take 2,000 Units by mouth daily.     dutasteride  (AVODART ) 0.5 MG capsule Take 1 capsule (0.5 mg total) by mouth daily. 30 capsule 11   finasteride  (PROSCAR ) 5 MG tablet Take 1 tablet (5 mg total) by mouth daily. 90 tablet 3   HYDROcodone -acetaminophen  (NORCO) 10-325 MG tablet Take 1 tablet by mouth 2 (two) times daily as needed (pain.).     Incontinence Supplies (BARD [place] INCONTIN CLAMP) MISC 1 Units by Does not apply route daily. 1 each 0   ipratropium (ATROVENT ) 0.03 % nasal spray Place 2 sprays into both nostrils 2 (two) times daily as needed for rhinitis.     isosorbide  mononitrate (IMDUR ) 60 MG 24 hr tablet Take 2 tablets (120 mg total) by mouth every evening. 180 tablet 3   latanoprost  (XALATAN ) 0.005 % ophthalmic solution Place  1 drop into both eyes at bedtime.     levocetirizine (XYZAL) 5 MG tablet Take 5 mg by mouth every evening.     losartan  (COZAAR ) 100 MG tablet Take 1 tablet (100 mg total) by mouth daily. 90 tablet 3   metFORMIN  (GLUCOPHAGE ) 500 MG tablet Take 500 mg by mouth 2 (two) times daily with a meal.     nitrofurantoin  (MACRODANTIN ) 50 MG capsule Take 1 capsule (50 mg total) by mouth at bedtime. 30 capsule 5   nitroGLYCERIN  (NITROSTAT ) 0.4 MG SL tablet Place 1 tablet (0.4 mg total) under the tongue every 5 (five) minutes x 3 doses as needed for chest pain. 25 tablet 3   OXYGEN  Inhale 3 L into the lungs at bedtime.     pantoprazole  (PROTONIX ) 40 MG tablet Take 1 tablet (40 mg total) by mouth daily. Take 30-60 min before first meal of the day     polyethylene glycol powder (GLYCOLAX /MIRALAX ) 17 GM/SCOOP powder Take 8.5 g by mouth daily.     spironolactone  (ALDACTONE ) 25 MG tablet TAKE 1 TABLET EVERY DAY 90 tablet 3   tamsulosin  (FLOMAX ) 0.4 MG CAPS capsule Take by mouth.     torsemide  (DEMADEX ) 10 MG tablet Take 10 mg every 4 days     triamcinolone cream (KENALOG) 0.1 % Apply 1 Application  topically 2 (two) times daily as needed (skin irritation.).     warfarin (COUMADIN ) 2 MG tablet Take 2 mg by mouth every evening.     No current facility-administered medications for this encounter.   Wt Readings from Last 3 Encounters:  09/25/23 93 kg (205 lb)  07/24/23 91.2 kg (201 lb)  06/13/23 94.3 kg (207 lb 14.3 oz)   BP 126/64   Pulse 63   Ht 5' 10.5 (1.791 m)   Wt 93 kg (205 lb)   SpO2 93%   BMI 29.00 kg/m   Physical Exam: General:  NAD. No resp difficulty, arrived in motorized scooter, elderly HEENT: Normal Neck: Supple. No JVD. Cor: Irregular rate & rhythm. No rubs, gallops or murmurs. Lungs: coarse lower lobes Abdomen: Soft, obese, nontender, nondistended.  Extremities: No cyanosis, clubbing, rash, edema Neuro: Alert & oriented x 3, moves all 4 extremities w/o difficulty. Affect pleasant.  Assessment/Plan: 1. Chronic diastolic CHF: Echo in 10/21 with EF 60-65%, mildly decreased RV systolic function with moderate RV enlargement, severe biatrial enlargement, PASP 75 mmHg, dilated IVC. After increasing diuretics, RHC in 1/22 showed normal filling pressures with moderate pulmonary hypertension. Echo 4/23 with EF 60-65%, mildly enlarged RV with mildly decreased RV function. Echo 3/24 showed EF 60-65% with mildly D-shaped septum, mild RV dilation with mildly decreased systolic function, IVC dilated, PASP 45 mmHg. NYHA class II, but not very active due to back pain and deconditioning.  He is not volume overloaded on exam.  - Change torsemide  10 mg every other day. Recent labs reviewed and are stable, K 4.7, SCr 1.24  - Continue spironolactone  25 mg daily. - Continue losartan  100 mg daily.  - No SGLT2 inhibitor with incontinence, UTIs, yeast infections.   2. 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  Moderate PH on RHC in 1/22.  Suspect group 3 PH in setting of scarring from recurrent aspiration PNA.  Less likely group 1 PH.  No chronic PEs on 7/22 V/Q scan. 3. Chronic hypoxemic  respiratory failure: Remote smoker.  Hilar and mediastinal lymphadenopathy on chest CT, no reported ILD.  Recurrent aspiration PNA in setting of chronic right vocal cord paralysis. He is  now only using oxygen  at night.    - Should follow with pulmonary.  4. CAD: s/p CABG in 2000.  Cath in 1/22 showed patent LIMA-LAD and SVG-D but occluded SVG-OM and SVG-RCA.  There was 60-70% ostial RCA stenosis and 95% proximal RCA stenosis with heavy calcification.  Discussed with interventional, with normal EF plan was to proceed with initial medical management and proceed to PCI if chest pain worsened (PCI would require atherectomy). No exertional chest pain. - Continue ASA 81 and atorvastatin  80 mg daily, good lipids 6/25 - Continue Imdur  120 mg daily for angina control.  - Off Toprol  XL with bradycardia. 5. Atrial fibrillation: Permanent. Rate control is reasonable without nodal blocking agent.  - On warfarin. PCP manages INR. No bleeding issues. 6. PAD: Occluded right fem-pop bypass. No claudication.  ABIs in 3/25 were normal.  - Follows with [facility]. 7. Carotid stenosis: 40-59% LICA stenosis on 3/25 dopplers, followed at [facility].  8. AAA: 5 cm AAA on 2/24 abdominal CT. EVAR 6/24.  9. Incontinence: Follows closely with urology. On tamsulosin .   10. Bradycardia: Given IV atropine with HR 30s in 4/24; he was asymptomatic.  Now off beta blocker.  Zio (5/24) showed continuous atrial fibrillation, average HR 60 bpm. Rare PVCs.  No lightheadedness.  11. 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  12. HTN: BP well-controlled.  - Continue losartan  100 mg daily.   Follow up in 6 months with Dr. Rolan [facility]in [facility] FNP-BC 09/25/2023

## 2023-09-24 ENCOUNTER — Encounter (HOSPITAL_COMMUNITY)

## 2023-09-24 ENCOUNTER — Telehealth (HOSPITAL_COMMUNITY): Payer: Self-pay

## 2023-09-24 NOTE — Telephone Encounter (Signed)
 Called to confirm/remind patient of their appointment at the [facility] on 09/25/23.   Appointment:   [] Confirmed  [x] Left mess   [] No answer/No voice mail  [] VM Full/unable to leave message  [] Phone not in service  And to bring in all medications and/or complete list.

## 2023-09-25 ENCOUNTER — Encounter (HOSPITAL_COMMUNITY): Payer: Self-pay

## 2023-09-25 ENCOUNTER — Ambulatory Visit (HOSPITAL_COMMUNITY)
Admission: RE | Admit: 2023-09-25 | Discharge: 2023-09-25 | Disposition: A | Discharge status: Home / Self Care | Source: Ambulatory Visit | Attending: Family Medicine | Admitting: Family Medicine

## 2023-09-25 VITALS — BP 126/64 | HR 63 | Ht 70.5 in | Wt 205.0 lb

## 2023-09-25 DIAGNOSIS — I2510 Atherosclerotic heart disease of native coronary artery without angina pectoris: Secondary | ICD-10-CM | POA: Diagnosis not present

## 2023-09-25 DIAGNOSIS — J3801 Paralysis of vocal cords and larynx, unilateral: Secondary | ICD-10-CM | POA: Diagnosis not present

## 2023-09-25 DIAGNOSIS — Z951 Presence of aortocoronary bypass graft: Secondary | ICD-10-CM | POA: Insufficient documentation

## 2023-09-25 DIAGNOSIS — I25810 Atherosclerosis of coronary artery bypass graft(s) without angina pectoris: Secondary | ICD-10-CM | POA: Diagnosis not present

## 2023-09-25 DIAGNOSIS — R001 Bradycardia, unspecified: Secondary | ICD-10-CM | POA: Diagnosis not present

## 2023-09-25 DIAGNOSIS — Z7982 Long term (current) use of aspirin: Secondary | ICD-10-CM | POA: Diagnosis not present

## 2023-09-25 DIAGNOSIS — J9611 Chronic respiratory failure with hypoxia: Secondary | ICD-10-CM | POA: Diagnosis not present

## 2023-09-25 DIAGNOSIS — I739 Peripheral vascular disease, unspecified: Secondary | ICD-10-CM | POA: Diagnosis not present

## 2023-09-25 DIAGNOSIS — R590 Localized enlarged lymph nodes: Secondary | ICD-10-CM | POA: Insufficient documentation

## 2023-09-25 DIAGNOSIS — I5032 Chronic diastolic (congestive) heart failure: Secondary | ICD-10-CM | POA: Diagnosis not present

## 2023-09-25 DIAGNOSIS — I4821 Permanent atrial fibrillation: Secondary | ICD-10-CM | POA: Diagnosis not present

## 2023-09-25 DIAGNOSIS — Z87891 Personal history of nicotine dependence: Secondary | ICD-10-CM | POA: Diagnosis not present

## 2023-09-25 DIAGNOSIS — I110 Hypertensive heart disease with heart failure: Secondary | ICD-10-CM | POA: Diagnosis not present

## 2023-09-25 DIAGNOSIS — I6529 Occlusion and stenosis of unspecified carotid artery: Secondary | ICD-10-CM

## 2023-09-25 DIAGNOSIS — D509 Iron deficiency anemia, unspecified: Secondary | ICD-10-CM | POA: Diagnosis not present

## 2023-09-25 DIAGNOSIS — N401 Enlarged prostate with lower urinary tract symptoms: Secondary | ICD-10-CM | POA: Diagnosis not present

## 2023-09-25 DIAGNOSIS — I493 Ventricular premature depolarization: Secondary | ICD-10-CM | POA: Diagnosis not present

## 2023-09-25 DIAGNOSIS — M549 Dorsalgia, unspecified: Secondary | ICD-10-CM | POA: Diagnosis not present

## 2023-09-25 DIAGNOSIS — Z9981 Dependence on supplemental oxygen: Secondary | ICD-10-CM | POA: Diagnosis not present

## 2023-09-25 DIAGNOSIS — I272 Pulmonary hypertension, unspecified: Secondary | ICD-10-CM

## 2023-09-25 DIAGNOSIS — Z8679 Personal history of other diseases of the circulatory system: Secondary | ICD-10-CM | POA: Diagnosis not present

## 2023-09-25 DIAGNOSIS — Z7901 Long term (current) use of anticoagulants: Secondary | ICD-10-CM | POA: Insufficient documentation

## 2023-09-25 DIAGNOSIS — Z79899 Other long term (current) drug therapy: Secondary | ICD-10-CM | POA: Diagnosis not present

## 2023-09-25 DIAGNOSIS — I714 Abdominal aortic aneurysm, without rupture, unspecified: Secondary | ICD-10-CM | POA: Diagnosis not present

## 2023-09-25 DIAGNOSIS — I10 Essential (primary) hypertension: Secondary | ICD-10-CM

## 2023-09-25 MED ORDER — TORSEMIDE 10 MG PO TABS: 10.0000 mg | ORAL_TABLET | Freq: Every day | ORAL | 3 refills | Status: AC | PRN

## 2023-09-25 MED ORDER — ATORVASTATIN CALCIUM 80 MG PO TABS: 80.0000 mg | ORAL_TABLET | Freq: Every day | ORAL | Status: AC

## 2023-09-25 NOTE — Patient Instructions (Signed)
 Medication Changes:  ONLY TAKE TORSEMIDE  10MG  ONCE A DAY AS NEEDED   CONTINUE ATORVASTATIN  80MG  ONCE DAILY   Follow-Up in: 6 MONTHS PLEASE CALL OUR OFFICE AROUND NOVEMBER TO GET SCHEDULED FOR YOUR APPOINTMENT. PHONE NUMBER IS [phone] OPTION 2   At the [facility], you and your health needs are our priority. We have a designated team specialized in the treatment of Heart Failure. This Care Team includes your primary Heart Failure Specialized Cardiologist (physician), Advanced Practice Providers (APPs- Physician Assistants and Nurse Practitioners), and Pharmacist who all work together to provide you with the care you need, when you need it.   You may see any of the following providers on your designated Care Team at your next follow up:  Dr. Toribio Fuel Dr. Ezra Shuck Dr. Ria Commander Dr. Odis Brownie Greig Mosses, NP Caffie Shed, [place] [facility] [place], [place] Beckey Coe, NP [place] Lee, NP Tinnie Redman, PharmD   Please be sure to bring in all your medications bottles to every appointment.   Need to Contact Us :  If you have any questions or concerns before your next appointment please send us  a message through [place] or call our office at [phone].    TO LEAVE A MESSAGE FOR THE NURSE SELECT OPTION 2, PLEASE LEAVE A MESSAGE INCLUDING: YOUR NAME DATE OF BIRTH CALL BACK NUMBER REASON FOR CALL**this is important as we prioritize the call backs  YOU WILL RECEIVE A CALL BACK THE SAME DAY AS LONG AS YOU CALL BEFORE 4:00 PM

## 2023-09-26 ENCOUNTER — Other Ambulatory Visit (HOSPITAL_COMMUNITY): Payer: Self-pay | Admitting: Cardiology

## 2023-10-14 DIAGNOSIS — E1122 Type 2 diabetes mellitus with diabetic chronic kidney disease: Secondary | ICD-10-CM | POA: Diagnosis not present

## 2023-10-14 DIAGNOSIS — E6609 Other obesity due to excess calories: Secondary | ICD-10-CM | POA: Diagnosis not present

## 2023-10-14 DIAGNOSIS — I480 Paroxysmal atrial fibrillation: Secondary | ICD-10-CM | POA: Diagnosis not present

## 2023-10-14 DIAGNOSIS — I10 Essential (primary) hypertension: Secondary | ICD-10-CM | POA: Diagnosis not present

## 2023-10-14 DIAGNOSIS — M1991 Primary osteoarthritis, unspecified site: Secondary | ICD-10-CM | POA: Diagnosis not present

## 2023-10-14 DIAGNOSIS — Z79899 Other long term (current) drug therapy: Secondary | ICD-10-CM | POA: Diagnosis not present

## 2023-10-14 DIAGNOSIS — Z6831 Body mass index (BMI) 31.0-31.9, adult: Secondary | ICD-10-CM | POA: Diagnosis not present

## 2023-12-03 ENCOUNTER — Encounter (HOSPITAL_COMMUNITY): Payer: Self-pay

## 2023-12-03 ENCOUNTER — Other Ambulatory Visit: Payer: Self-pay

## 2023-12-03 ENCOUNTER — Emergency Department (HOSPITAL_COMMUNITY)

## 2023-12-03 ENCOUNTER — Emergency Department (HOSPITAL_COMMUNITY)
Admission: EM | Admit: 2023-12-03 | Discharge: 2023-12-03 | Disposition: A | Discharge status: Home / Self Care | Attending: Emergency Medicine | Admitting: Emergency Medicine

## 2023-12-03 ENCOUNTER — Ambulatory Visit
Admission: EM | Admit: 2023-12-03 | Discharge: 2023-12-03 | Disposition: A | Discharge status: Home / Self Care | Attending: Family Medicine | Admitting: Family Medicine

## 2023-12-03 DIAGNOSIS — Y92009 Unspecified place in unspecified non-institutional (private) residence as the place of occurrence of the external cause: Secondary | ICD-10-CM | POA: Diagnosis not present

## 2023-12-03 DIAGNOSIS — W010XXA Fall on same level from slipping, tripping and stumbling without subsequent striking against object, initial encounter: Secondary | ICD-10-CM | POA: Diagnosis not present

## 2023-12-03 DIAGNOSIS — S39012A Strain of muscle, fascia and tendon of lower back, initial encounter: Secondary | ICD-10-CM

## 2023-12-03 DIAGNOSIS — R519 Headache, unspecified: Secondary | ICD-10-CM | POA: Insufficient documentation

## 2023-12-03 DIAGNOSIS — S0990XA Unspecified injury of head, initial encounter: Secondary | ICD-10-CM | POA: Diagnosis not present

## 2023-12-03 DIAGNOSIS — W19XXXA Unspecified fall, initial encounter: Secondary | ICD-10-CM | POA: Diagnosis not present

## 2023-12-03 DIAGNOSIS — I10 Essential (primary) hypertension: Secondary | ICD-10-CM | POA: Diagnosis not present

## 2023-12-03 MED ORDER — TIZANIDINE HCL 4 MG PO CAPS: 4.0000 mg | ORAL_CAPSULE | Freq: Three times a day (TID) | ORAL | 0 refills | Status: AC | PRN

## 2023-12-03 MED ORDER — DEXAMETHASONE SODIUM PHOSPHATE 10 MG/ML IJ SOLN
10.0000 mg | Freq: Once | INTRAMUSCULAR | Status: AC
  Administered 2023-12-03: 10 mg via INTRAMUSCULAR

## 2023-12-03 MED ORDER — HYDROCODONE-ACETAMINOPHEN 5-325 MG PO TABS
2.0000 | ORAL_TABLET | Freq: Once | ORAL | Status: AC
  Administered 2023-12-03: 2 via ORAL
  Filled 2023-12-03: qty 2

## 2023-12-03 NOTE — ED Triage Notes (Signed)
 Pt bib EMS from home with c/o falling. Pt went to see Dr today and got Injection in left hip and when he was getting out of car, pt left hip gave out and he fell, hitting head- no bleeding or abrasions noted, but pt is on blood thinner.

## 2023-12-03 NOTE — ED Provider Notes (Signed)
 [place] [facility] AT [facility] Provider Note   CSN: [id number] Arrival date & time: 12/03/23  2017     History Chief Complaint  Patient presents with   Fall    HPI Troy [name] is a 83 y.o. male presenting for chief complaint of ground-level fall.  States that he left from his orthopedic appointment today after getting a left hip injection.  When he went to get his walker out of the truck he lost his balance falling backwards hitting his left elbow and neck.  He is having a slight headache though he had a 3-hour wait prior to being seen tonight and his headache resolved.  Overall states that he is in no acute distress denies fevers chills nausea vomiting shortness of breath. He is ambulatory tolerating p.o. intake.  Notably, patient is states he would like to be discharged by the time I evaluated him..   Patient's recorded medical, surgical, social, medication list and allergies were reviewed in the Snapshot window as part of the initial history.   Review of Systems   Review of Systems  Constitutional:  Negative for chills and fever.  HENT:  Negative for ear pain and sore throat.   Eyes:  Negative for pain and visual disturbance.  Respiratory:  Negative for cough and shortness of breath.   Cardiovascular:  Negative for chest pain and palpitations.  Gastrointestinal:  Negative for abdominal pain and vomiting.  Genitourinary:  Negative for dysuria and hematuria.  Musculoskeletal:  Negative for arthralgias and back pain.  Skin:  Negative for color change and rash.  Neurological:  Positive for headaches. Negative for seizures and syncope.  All other systems reviewed and are negative.   Physical Exam Updated Vital Signs BP (!) 190/87 (BP Location: Right Arm)   Pulse (!) 130   Temp 98.3 F (36.8 C) (Oral)   Resp 17   Ht 5' 10 (1.778 m)   Wt 93 kg   SpO2 (!) 75%   BMI 29.42 kg/m  Physical Exam Vitals and nursing note reviewed.  Constitutional:       General: He is not in acute distress.    Appearance: He is well-developed.  HENT:     Head: Normocephalic and atraumatic.  Eyes:     Conjunctiva/sclera: Conjunctivae normal.  Cardiovascular:     Rate and Rhythm: Normal rate and regular rhythm.     Heart sounds: No murmur heard. Pulmonary:     Effort: Pulmonary effort is normal. No respiratory distress.     Breath sounds: Normal breath sounds.  Abdominal:     Palpations: Abdomen is soft.     Tenderness: There is no abdominal tenderness.  Musculoskeletal:        General: No swelling.     Cervical back: Neck supple.  Skin:    General: Skin is warm and dry.     Capillary Refill: Capillary refill takes less than 2 seconds.  Neurological:     Mental Status: He is alert.  Psychiatric:        Mood and Affect: Mood normal.      ED Course/ Medical Decision Making/ A&P    Procedures Procedures   Medications Ordered in ED Medications  HYDROcodone -acetaminophen  (NORCO/VICODIN) 5-325 MG per tablet 2 tablet (has no administration in time range)   Medical Decision Making:   Troy [name] is a 83 y.o. male who presented to the ED today with a fall at their home  detailed above. They are not on  a blood thinner. Patient placed on continuous vitals and telemetry monitoring while in ED which was reviewed periodically.   Complete initial physical exam performed, notably the patient  was hemodynamically stable  in no acute distress. No obvious deformities or injuries appreciated on extensive physical exam including active range of motion of all joints.     Reviewed and confirmed nursing documentation for past medical history, family history, social history.    Initial Assessment/Plan:   This is a patient presenting with a moderate blunt mechanism trauma.  As such, I have considered intracranial injuries including intracranial hemorrhage, intrathoracic injuries including blunt myocardial or blunt lung injury, blunt abdominal injuries  including aortic dissection, bladder injury, spleen injury, liver injury and I have considered orthopedic injuries including extremity or spinal injury. This is most consistent with an acute life/limb threatening illness complicated by underlying chronic conditions.  With the patient's presentation of moderate mechanism trauma but an otherwise reassuring exam, patient warrants targeted evaluation for potential traumatic injuries. Will proceed with targeted evaluation for potential injuries. Will proceed with CT Head, Cervical Spine CT.    Images reviewed and agree with radiology interpretation.  CT Head Wo Contrast Result Date: 12/03/2023 CLINICAL DATA:  fall. blunt trauma EXAM: CT HEAD WITHOUT CONTRAST CT CERVICAL SPINE WITHOUT CONTRAST TECHNIQUE: Multidetector CT imaging of the head and cervical spine was performed following the standard protocol without intravenous contrast. Multiplanar CT image reconstructions of the cervical spine were also generated. RADIATION DOSE REDUCTION: This exam was performed according to the departmental dose-optimization program which includes automated exposure control, adjustment of the mA and/or kV according to patient size and/or use of iterative reconstruction technique. COMPARISON:  None Available. FINDINGS: CT HEAD FINDINGS Brain: Normal anatomic configuration. Parenchymal volume loss is commensurate with the patient's age. Extensive subcortical and periventricular white matter changes are present likely reflecting the sequela of small vessel ischemia. 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 No abnormal intra or extra-axial mass lesion or fluid collection. No abnormal mass effect or midline shift. No evidence of acute intracranial hemorrhage or infarct. Ventricular size is normal. Cerebellum unremarkable. Vascular: No asymmetric hyperdense vasculature at the skull base. Skull: Intact Sinuses/Orbits: Paranasal sinuses are clear.  Orbits are unremarkable. Other: Mastoid air cells and middle ear cavities are clear. CT CERVICAL SPINE FINDINGS Alignment: 3 mm anterolisthesis C6-7. Otherwise normal cervical alignment. Skull base and vertebrae: Craniocervical alignment is normal. The atlantodental interval is not widened. No acute fracture of the cervical spine. Vertebral body height is preserved. 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 Posterior disc osteophyte complex at C3-4 results in mild central canal stenosis with mild flattening of the thecal sac. No canal hematoma. No prevertebral soft tissue swelling or paraspinal fluid collection. There is asymmetric thickening and medial ization of the right vocal cord which may relate to vocal cord paralysis. There is extensive atherosclerotic calcification within carotid bifurcations bilaterally. Disc levels: There is disc space narrowing and endplate remodeling throughout the cervical spine in keeping with changes advanced degenerative disc disease, most severe at C3-4 and C5-6. Multilevel uncovertebral and facet arthrosis results in multilevel severe neuroforaminal narrowing, most severe on the left at C3-4 and bilaterally at C5-6. Prevertebral soft tissues are not thickened  on sagittal reformats. Upper chest: Emphysema Other: None IMPRESSION: 1. No acute intracranial abnormality. No calvarial fracture. 2. Extensive senescent change and remote lacunar infarcts as outlined above. 3. No acute fracture or listhesis of the cervical spine. 4. 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 5. 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 6. Asymmetric thickening and medial ization of the right vocal cord which may relate to vocal cord paralysis. Correlation with direct visualization is recommended. 7. Extensive atherosclerotic calcification within the carotid bifurcations bilaterally. If indicated, the degree of stenosis would be better assessed with dedicated carotid artery Doppler sonography. 8. Emphysema. Emphysema (ICD10-J43.9). Electronically Signed   By: Dorethia Molt M.D.   On: 12/03/2023 22:17   CT Cervical Spine Wo Contrast Result Date: 12/03/2023 CLINICAL DATA:  fall. blunt trauma EXAM: CT HEAD WITHOUT CONTRAST CT CERVICAL SPINE WITHOUT CONTRAST TECHNIQUE: Multidetector CT imaging of the head and cervical spine was performed following the standard protocol without intravenous contrast. Multiplanar CT image reconstructions of the cervical spine were also generated. RADIATION DOSE REDUCTION: This exam was performed according to the departmental dose-optimization program which includes automated exposure control, adjustment of the mA and/or kV according to patient size and/or use of iterative reconstruction technique. COMPARISON:  None Available. FINDINGS: CT HEAD FINDINGS Brain: Normal anatomic configuration. Parenchymal volume loss is commensurate with the patient's age. Extensive subcortical and periventricular white matter changes are present likely reflecting the sequela of small vessel ischemia. 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 No abnormal intra or extra-axial mass lesion or fluid collection. No abnormal mass effect or midline shift. No evidence of acute intracranial hemorrhage or infarct. Ventricular size is normal. Cerebellum unremarkable. Vascular: No asymmetric hyperdense vasculature at the skull base. Skull: Intact Sinuses/Orbits: Paranasal sinuses are clear. Orbits are  unremarkable. Other: Mastoid air cells and middle ear cavities are clear. CT CERVICAL SPINE FINDINGS Alignment: 3 mm anterolisthesis C6-7. Otherwise normal cervical alignment. Skull base and vertebrae: Craniocervical alignment is normal. The atlantodental interval is not widened. No acute fracture of the cervical spine. Vertebral body height is preserved. 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 Posterior disc osteophyte complex at C3-4 results in mild central canal stenosis with mild flattening of the thecal sac. No canal hematoma. No prevertebral soft tissue swelling or paraspinal fluid collection. There is asymmetric thickening and medial ization of the right vocal cord which may relate to vocal cord paralysis. There is extensive atherosclerotic calcification within carotid bifurcations bilaterally. Disc levels: There is disc space narrowing and endplate remodeling throughout the cervical spine in keeping with changes advanced degenerative disc disease, most severe at C3-4 and C5-6. Multilevel uncovertebral and facet arthrosis results in multilevel severe neuroforaminal narrowing, most severe on the left at C3-4 and bilaterally at C5-6. Prevertebral soft tissues are not thickened on sagittal reformats. Upper chest: Emphysema Other: None IMPRESSION: 1. No acute intracranial abnormality. No calvarial fracture. 2. Extensive senescent change and remote lacunar infarcts as outlined above. 3. No acute fracture or listhesis of the cervical spine.  4. 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 5. Severe central canal stenosis at  C5-6 with an AP diameter of the spinal canal of 4-5 mm and marked flattening of the thecal sac. 6. Asymmetric thickening and medial ization of the right vocal cord which may relate to vocal cord paralysis. Correlation with direct visualization is recommended. 7. Extensive atherosclerotic calcification within the carotid bifurcations bilaterally. If indicated, the degree of stenosis would be better assessed with dedicated carotid artery Doppler sonography. 8. Emphysema. Emphysema (ICD10-J43.9). Electronically Signed   By: Dorethia Molt M.D.   On: 12/03/2023 22:17    Final Reassessment and Plan:   No acute distress on reassessment, stable for outpatient care and management.  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  Is not having any neurologic symptoms at this time but should follow-up with neurosurgery for definitive care and management.  Disposition:  I have considered need for hospitalization, however, considering all of the above, I believe this patient is stable for discharge at this time.  Patient/family educated about specific return precautions for given chief complaint and symptoms.  Patient/family educated about follow-up with PCP.     Patient/family expressed understanding of return precautions and need for follow-up. Patient spoken to regarding all imaging and laboratory results and appropriate follow up for these results. All education provided in verbal form with additional information in written form. Time was allowed for answering of patient questions. Patient discharged.    Emergency Department Medication Summary:   Medications  HYDROcodone -acetaminophen  (NORCO/VICODIN) 5-325 MG per tablet 2 tablet (has no administration in time range)            Clinical Impression:  1. Fall, initial encounter      Discharge   Final Clinical Impression(s) / ED Diagnoses Final diagnoses:  Fall, initial encounter    Rx / DC Orders ED Discharge Orders      None         Jerral Meth, MD 12/03/23 2339

## 2023-12-03 NOTE — ED Provider Notes (Signed)
 [facility]    CSN: [id number] Arrival date & time: 12/03/23  1651      History   Chief Complaint No chief complaint on file.   HPI Troy [name] is a 83 y.o. male.   Patient presenting today with 3-week history of acute on chronic worsening left sided low back pain particularly with weightbearing and range of motion.  States the pain runs down the back of his leg with weightbearing.  Denies new injury, bowel or bladder incontinence, saddle anesthesias, fevers, urinary symptoms.  Taking pain patches, hydrocodone  and Tylenol  with minimal relief.  States when he gets these flares episodically he typically gets a steroid shot and muscle relaxers with good relief.    Past Medical History:  Diagnosis Date   A-fib [facility])    AAA (abdominal aortic aneurysm) (HCC)    Followed by Dr. Krystal Early   Arthritis    CAD (coronary artery disease)    a. CABG 2000.   Cancer [facility])    Prostate:  Radiation Tx   Carotid artery disease (HCC)    Chest pain    precordial. mild chronic .SABRA... nonischemic   CHF (congestive heart failure) (HCC)    Chronic edema    Coronary artery disease    a. Nuclear, January, 2008, no ischemia b. 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   Other abnormalities of gait and mobility 03/07/2023   Essential hypertension 03/07/2023   Orchitis and epididymitis 03/07/2023   Gastroesophageal reflux disease with esophagitis without hemorrhage 02/13/2023   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   Esophageal stricture 11/19/2022   Need for assistance with personal care 11/19/2022   Nail dystrophy 11/19/2022   Tubular adenoma of colon 11/19/2022   Essential (primary) hypertension 11/19/2022   AAA (abdominal aortic aneurysm) (HCC) 09/26/2022   Urinary incontinence 06/14/2022   Debility 05/07/2022   Gross hematuria 01/02/2022   Chronic heart failure with preserved ejection fraction (HFpEF) (HCC) 11/28/2021   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/06/2021   Upper airway cough syndrome 02/04/2021   Nocturnal hypoxemia 06/27/2020   DOE (dyspnea on exertion) 06/07/2020   Microcytic anemia 03/26/2020   Malignant neoplasm of prostate (HCC) 06/10/2015   Vocal fold paralysis, 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   Pharyngeal dysphagia 05/04/2015   Dysphonia 05/04/2015   Atrial fibrillation (HCC) 12/24/2014   Erosive esophagitis 02/11/2014   Aneurysm of abdominal vessel (HCC) 07/28/2013   Peripheral vascular  disease, unspecified (HCC) 07/28/2013   Esophageal dysphagia 07/21/2013   Type 2 diabetes mellitus (HCC) 09/27/2012   Atherosclerosis of native artery of extremity with intermittent claudication (HCC) 02/12/2012   Limb swelling 02/12/2012   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/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   Neck pain    Carotid artery disease (HCC)    Renal artery stenosis (HCC)    S/P femoropopliteal bypass surgery    Ejection fraction    Sinus bradycardia    Hyperlipidemia 01/04/2010   CORONARY  ARTERY BYPASS GRAFT, HX OF 07/04/2008    Past Surgical History:  Procedure Laterality Date   ABDOMINAL AORTIC ENDOVASCULAR STENT GRAFT N/A 09/26/2022   Procedure: ABDOMINAL AORTIC ENDOVASCULAR STENT GRAFT;  Surgeon: Gretta Lonni PARAS, MD;  Location: [facility] OR;  Service: Vascular;  Laterality: N/A;   AORTOGRAM  09/26/2022   Procedure: AORTOGRAM;  Surgeon: Gretta Lonni PARAS, MD;  Location: [facility] OR;  Service: Vascular;;   BALLOON DILATION  03/14/2023   Procedure: MERRILL HODGKIN;  Surgeon: Cinderella Deatrice FALCON, MD;  [facility];  Service: Endoscopy;;   BIOPSY  03/14/2023   Procedure: BIOPSY;  Surgeon: Cinderella Deatrice FALCON, MD;  [facility];  Service: Endoscopy;;   CARDIAC CATHETERIZATION  09/26/2012   1/4 patent bypass (occluded SVG-PDA, SVG-OM, LIMA-LAD), SVG-diagonal patent and fills the diagonal and LAD, distal RCA occlusion with left to right collateralization, patent circumflex, LAD with no flow-limiting disease and antegrade flow competitively from SVG-diagonal; EF 60-65%   COLONOSCOPY  11/30/2009   MFM:[email or web address]. next TCS 11/2019   COLONOSCOPY WITH PROPOFOL  N/A 12/18/2019   Procedure: COLONOSCOPY WITH PROPOFOL ;  Surgeon: Eartha Angelia Sieving, MD;  [facility];  Service: Gastroenterology;  Laterality: N/A;  1030   COLONOSCOPY WITH PROPOFOL  N/A 03/14/2023   Procedure: COLONOSCOPY WITH PROPOFOL ;  Surgeon: Cinderella Deatrice FALCON, MD;  [facility];  Service: Endoscopy;  Laterality: N/A;  12:45pm;asa 3   CORONARY ARTERY BYPASS GRAFT  2000   CYSTOSCOPY N/A 08/31/2021   Procedure: CYSTOSCOPY;  Surgeon: Sherrilee Belvie CROME, MD;  Location: [facility];  Service: Urology;  Laterality: N/A;  pt knows to arrive at 7:00   CYSTOSCOPY WITH FULGERATION N/A 03/22/2022   Procedure: CYSTOSCOPY WITH FULGERATION;  Surgeon: Sherrilee Belvie CROME, MD;  Location: [facility];  Service: Urology;  Laterality: N/A;   CYSTOSCOPY WITH INJECTION N/A 06/13/2023   Procedure: CYSTOSCOPY  WITH INJECTION- Botox  100 units;  Surgeon: Sherrilee Belvie CROME, MD;  Location: [facility];  Service: Urology;  Laterality: N/A;  pt will arrive between 8:30 - 9:00   ENDARTERECTOMY FEMORAL Right 09/26/2022   Procedure: RIGHT COMMON FEMORAL ENDARTERECTOMY WITH BOVINE PATCH;  Surgeon: Gretta Lonni PARAS, MD;  Location: [facility] OR;  Service: Vascular;  Laterality: Right;   ESOPHAGOGASTRODUODENOSCOPY N/A 08/12/2013   MFM:[id number] peptic stricture with erosive refluxesophagitis - status post Maloney dilation. Hiatal hernia. Abnormalgastric mucosa. Deformity of the pyloric channel suggestive ofprior peptic ulcer disease. Duodenal bulbar diverticulum Statuspost gastric biopsy. h.pylori   ESOPHAGOGASTRODUODENOSCOPY (EGD) WITH PROPOFOL  N/A 03/14/2023   Procedure: ESOPHAGOGASTRODUODENOSCOPY (EGD) WITH PROPOFOL ;  Surgeon: Cinderella Deatrice FALCON, MD;  [facility];  Service: Endoscopy;  Laterality: N/A;  12:45pm;asa 3   LEFT HEART CATHETERIZATION WITH CORONARY/GRAFT ANGIOGRAM N/A 09/26/2012   Procedure: LEFT HEART CATHETERIZATION WITH EL BILE;  Surgeon: Lonni JONETTA Cash, MD;  Location: [facility] CATH LAB;  Service: Cardiovascular;  Laterality: N/A;   MALONEY DILATION N/A 08/12/2013   Procedure: AGAPITO DILATION;  Surgeon: Lamar CHRISTELLA Hollingshead,  MD;  [facility];  Service: Endoscopy;  Laterality: N/A;   POLYPECTOMY  12/18/2019   Procedure: POLYPECTOMY;  Surgeon: Eartha Angelia Sieving, MD;  [facility];  Service: Gastroenterology;;  ascending colon polyp    POLYPECTOMY  03/14/2023   Procedure: POLYPECTOMY INTESTINAL;  Surgeon: Cinderella Deatrice FALCON, MD;  [facility];  Service: Endoscopy;;   PR VEIN BYPASS GRAFT,AORTO-FEM-POP Right 07/19/1999   PR VEIN BYPASS GRAFT,AORTO-FEM-POP Left 05/02/2006   RIGHT HEART CATH AND CORONARY/GRAFT ANGIOGRAPHY N/A 04/11/2020   Procedure: RIGHT HEART CATH AND CORONARY/GRAFT ANGIOGRAPHY;  Surgeon: Rolan Ezra RAMAN, MD;  Location:  [facility];  Service: Cardiovascular;  Laterality: N/A;   SAVORY DILATION N/A 08/12/2013   Procedure: SAVORY DILATION;  Surgeon: Lamar CHRISTELLA Hollingshead, MD;  [facility];  Service: Endoscopy;  Laterality: N/A;   TRANSURETHRAL RESECTION OF BLADDER TUMOR N/A 08/31/2021   Procedure: TRANSURETHRAL RESECTION OF BLADDER TUMOR (TURBT);  Surgeon: Sherrilee Belvie CROME, MD;  Location: [facility];  Service: Urology;  Laterality: N/A;   ULTRASOUND GUIDANCE FOR VASCULAR ACCESS Bilateral 09/26/2022   Procedure: ULTRASOUND GUIDANCE FOR VASCULAR ACCESS;  Surgeon: Gretta Lonni PARAS, MD;  Location[facility] OR;  Service: Vascular;  Laterality: Bilateral;   VASCULAR SURGERY     WRIST SURGERY     cyst removal       Home Medications    Prior to Admission medications   Medication Sig Start Date End Date Taking? Authorizing Provider  tiZANidine  (ZANAFLEX ) 4 MG capsule Take 1 capsule (4 mg total) by mouth 3 (three) times daily as needed for muscle spasms. Do not drink alcohol or drive while taking this medication.  May cause drowsiness. 12/03/23  Yes Stuart Vernell Norris, PA-C  acetaminophen  (TYLENOL ) 500 MG tablet Take 500-1,000 mg by mouth every 6 (six) hours as needed (pain.).    [provider]  albuterol  (VENTOLIN  HFA) 108 (90 Base) MCG/ACT inhaler Inhale 2 puffs into the lungs every 6 (six) hours as needed for wheezing or shortness of breath. 03/29/20   Pearlean Manus, MD  aspirin  81 MG EC tablet Take 1 tablet (81 mg total) by mouth daily with breakfast. 03/29/20   Emokpae, Courage, MD  atorvastatin  (LIPITOR) 80 MG tablet Take 1 tablet (80 mg total) by mouth daily. 09/25/23   Glena Harlene CHRISTELLA, FNP  Cholecalciferol (VITAMIN D3) 50 MCG (2000 UT) capsule Take 2,000 Units by mouth daily.    [provider]  dutasteride  (AVODART ) 0.5 MG capsule Take 1 capsule (0.5 mg total) by mouth daily. 02/27/23   McKenzie, Belvie CROME, MD  finasteride  (PROSCAR ) 5 MG tablet Take 1 tablet (5 mg total) by  mouth daily. 09/06/23   McKenzie, Belvie CROME, MD  HYDROcodone -acetaminophen  (NORCO) 10-325 MG tablet Take 1 tablet by mouth 2 (two) times daily as needed (pain.). 01/08/23   [provider]  Incontinence Supplies (BARD [place] INCONTIN CLAMP) MISC 1 Units by Does not apply route daily. 11/30/22   McKenzie, Belvie CROME, MD  ipratropium (ATROVENT ) 0.03 % nasal spray Place 2 sprays into both nostrils 2 (two) times daily as needed for rhinitis.    [provider]  isosorbide  mononitrate (IMDUR ) 60 MG 24 hr tablet Take 2 tablets (120 mg total) by mouth every evening. 08/16/23   Rolan Ezra RAMAN, MD  latanoprost  (XALATAN ) 0.005 % ophthalmic solution Place 1 drop into both eyes at bedtime.    [provider]  levocetirizine (XYZAL) 5 MG tablet Take 5 mg by mouth every  evening. 09/04/22   [provider]  losartan  (COZAAR ) 100 MG tablet Take 1 tablet (100 mg total) by mouth daily. 07/24/23   Rolan Ezra RAMAN, MD  metFORMIN  (GLUCOPHAGE ) 500 MG tablet Take 500 mg by mouth 2 (two) times daily with a meal. 12/18/21   [provider]  nitrofurantoin  (MACRODANTIN ) 50 MG capsule Take 1 capsule (50 mg total) by mouth at bedtime. 08/29/23   McKenzie, Belvie CROME, MD  nitroGLYCERIN  (NITROSTAT ) 0.4 MG SL tablet Place 1 tablet (0.4 mg total) under the tongue every 5 (five) minutes x 3 doses as needed for chest pain. 12/14/19   Dunn, Dayna N, PA-C  OXYGEN  Inhale 3 L into the lungs at bedtime.    [provider]  pantoprazole  (PROTONIX ) 40 MG tablet Take 1 tablet (40 mg total) by mouth daily. Take 30-60 min before first meal of the day 03/14/23 07/23/24  Cinderella Deatrice FALCON, MD  polyethylene glycol powder (GLYCOLAX /MIRALAX ) 17 GM/SCOOP powder Take 8.5 g by mouth daily. 03/14/23   Ahmed, Deatrice FALCON, MD  spironolactone  (ALDACTONE ) 25 MG tablet TAKE 1 TABLET EVERY DAY 06/05/23   Clegg, Amy D, NP  tamsulosin  (FLOMAX ) 0.4 MG CAPS capsule Take by mouth. 07/08/23   [provider]   torsemide  (DEMADEX ) 10 MG tablet Take 1 tablet (10 mg total) by mouth daily as needed. 09/25/23   Glena Harlene HERO, FNP  triamcinolone cream (KENALOG) 0.1 % Apply 1 Application topically 2 (two) times daily as needed (skin irritation.). 09/04/22   [provider]  warfarin (COUMADIN ) 2 MG tablet Take 2 mg by mouth every evening. 12/19/21   [provider]    Family History Family History  Problem Relation Age of Onset   Deep vein thrombosis Father    Lung cancer Sister    Diabetes Sister    Heart disease Sister        After age 37   Hyperlipidemia Sister    Hypertension Sister    Lung cancer Sister    Breast cancer Sister    Hypertension Mother    Diabetes Sister    Coronary artery disease Other        family hx of   Cancer Brother        crab cancer   Heart disease Brother    Heart attack Brother    Heart attack Daughter    Colon cancer Neg Hx    Stroke Neg Hx     Social History Social History   Tobacco Use   Smoking status: Former    Current packs/day: 0.00    Average packs/day: 2.0 packs/day for 70.0 years (140.0 ttl pk-yrs)    Types: Cigarettes    Start date: 04/21/1948    Quit date: 04/21/2018    Years since quitting: 5.6   Smokeless tobacco: Never  Vaping Use   Vaping status: Never Used  Substance Use Topics   Alcohol use: Yes    Alcohol/week: 0.0 - 2.0 standard drinks of alcohol    Comment: OCCASIONAL   Drug use: Never     Allergies   Niacin, Contrast media [iodinated contrast media], Gemtesa  [vibegron ], Iodine -131, Jardiance  [empagliflozin ], Myrbetriq  [mirabegron ], and Nsaids   Review of Systems Review of Systems Per HPI  Physical Exam Triage Vital Signs ED Triage Vitals [12/03/23 1728]  Encounter Vitals Group     BP (!) 142/59     Girls Systolic BP Percentile      Girls Diastolic BP Percentile      Boys Systolic  BP Percentile      Boys Diastolic BP Percentile      Pulse Rate 64     Resp 18     Temp 98.2 F (36.8 C)      Temp Source Oral     SpO2 94 %     Weight      Height      Head Circumference      Peak Flow      Pain Score 7     Pain Loc      Pain Education      Exclude from Growth Chart    No data found.  Updated Vital Signs BP (!) 142/59 (BP Location: Left Arm)   Pulse 64   Temp 98.2 F (36.8 C) (Oral)   Resp 18   SpO2 94%   Visual Acuity Right Eye Distance:   Left Eye Distance:   Bilateral Distance:    Right Eye Near:   Left Eye Near:    Bilateral Near:     Physical Exam Vitals and nursing note reviewed.  Constitutional:      Appearance: Normal appearance.  HENT:     Head: Atraumatic.     Mouth/Throat:     Mouth: Mucous membranes are moist.  Eyes:     Extraocular Movements: Extraocular movements intact.     Conjunctiva/sclera: Conjunctivae normal.  Cardiovascular:     Rate and Rhythm: Normal rate.  Pulmonary:     Effort: Pulmonary effort is normal.  Musculoskeletal:     Cervical back: Normal range of motion and neck supple.     Comments: Range of motion at baseline.  Negative straight leg raise bilateral lower extremities.  No midline spinal tenderness to palpation diffusely.  Skin:    General: Skin is warm and dry.  Neurological:     General: No focal deficit present.     Mental Status: He is oriented to person, place, and time.     Comments: Bilateral lower extremities neurovascularly intact  Psychiatric:        Mood and Affect: Mood normal.        Thought Content: Thought content normal.        Judgment: Judgment normal.      UC Treatments / Results  Labs (all labs ordered are listed, but only abnormal results are displayed) Labs Reviewed - No data to display  EKG   Radiology No results found.  Procedures Procedures (including critical care time)  Medications Ordered in UC Medications  dexamethasone  (DECADRON ) injection 10 mg (10 mg Intramuscular Given 12/03/23 1844)    Initial Impression / Assessment and Plan / UC Course  I have reviewed the  triage vital signs and the nursing notes.  Pertinent labs & imaging results that were available during my care of the patient were reviewed by me and considered in my medical decision making (see chart for details).     Vitals and exam overall reassuring today with no red flag findings.  This is an acute on chronic issue for him.  Will treat with IM Decadron , Zanaflex , heat, soft, stretches, rest.  Return for worsening symptoms.  Final Clinical Impressions(s) / UC Diagnoses   Final diagnoses:  Strain of lumbar region, initial encounter   Discharge Instructions   None    ED Prescriptions     Medication Sig Dispense Auth. Provider   tiZANidine  (ZANAFLEX ) 4 MG capsule Take 1 capsule (4 mg total) by mouth 3 (three) times daily as needed for muscle spasms.  Do not drink alcohol or drive while taking this medication.  May cause drowsiness. 15 capsule Stuart Vernell Norris, [place]      PDMP not reviewed this encounter.   Stuart Vernell Norris, [place] 12/03/23 1844

## 2023-12-03 NOTE — ED Notes (Signed)
 ED Provider at bedside.

## 2023-12-03 NOTE — ED Triage Notes (Signed)
 Pt reports he has been having low back pain x  3 weeks with walking and standing.When the pain occurs in his low back it takes his breath away. (Appears SHOB). Denies injury/ has arthritis  Primary referred pt here    Tried a pain patch, hydrocodone , and tylenol  but no relief

## 2023-12-03 NOTE — ED Notes (Signed)
Pt given sprite per request 

## 2023-12-05 ENCOUNTER — Telehealth: Payer: Self-pay

## 2023-12-05 NOTE — Telephone Encounter (Signed)
 Pharmacy called stating that pt's insurance would not cover the cost of the capsules but would cover the cost of the tablets. Genna has been given to switch to the tablets for insurance purposes.

## 2023-12-09 ENCOUNTER — Ambulatory Visit: Admitting: Urology

## 2023-12-09 DIAGNOSIS — N3281 Overactive bladder: Secondary | ICD-10-CM

## 2023-12-09 DIAGNOSIS — N3000 Acute cystitis without hematuria: Secondary | ICD-10-CM | POA: Diagnosis not present

## 2023-12-09 DIAGNOSIS — N390 Urinary tract infection, site not specified: Secondary | ICD-10-CM

## 2023-12-09 LAB — URINALYSIS, ROUTINE W REFLEX MICROSCOPIC
Bilirubin, UA: NEGATIVE
Glucose, UA: NEGATIVE
Ketones, UA: NEGATIVE
Nitrite, UA: NEGATIVE
Specific Gravity, UA: 1.01 (ref 1.005–1.030)
Urobilinogen, Ur: 0.2 mg/dL (ref 0.2–1.0)
pH, UA: 7 (ref 5.0–7.5)

## 2023-12-09 LAB — MICROSCOPIC EXAMINATION
RBC, Urine: >30 /HPF — AB (ref 0–2)
WBC, UA: >30 /HPF — AB (ref 0–5)

## 2023-12-09 MED ORDER — NITROFURANTOIN MONOHYD MACRO 100 MG PO CAPS: 100.0000 mg | ORAL_CAPSULE | Freq: Two times a day (BID) | ORAL | 0 refills | Status: DC

## 2023-12-09 MED ORDER — SULFAMETHOXAZOLE-TRIMETHOPRIM 800-160 MG PO TABS: 1.0000 | ORAL_TABLET | Freq: Two times a day (BID) | ORAL | 0 refills | Status: DC

## 2023-12-09 NOTE — Progress Notes (Unsigned)
 12/09/2023 2:13 PM   Troy [name] 1940-05-23 [id number]  Referring provider: [name] LABOR, PA-C [address] [place],  [place] [address]  Urinary incontinence   HPI: Mr Saindon is a 82yo here for followup for urinary incontinence. He was scheduled for intravesical botox  today but his urine is concerning for infection. For thew past 10 days he has noted cloudy urine and increased urinary incontinence. He has a fall 1 week ago.    PMH: Past Medical History:  Diagnosis Date   A-fib [facility])    AAA (abdominal aortic aneurysm) (HCC)    Followed by Dr. Krystal Early   Arthritis    CAD (coronary artery disease)    a. CABG 2000.   Cancer [facility])    Prostate:  Radiation Tx   Carotid artery disease (HCC)    Chest pain    precordial. mild chronic .SABRA... nonischemic   CHF (congestive heart failure) (HCC)    Chronic edema    Coronary artery disease    a. Nuclear, January, 2008, no ischemia b. 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 Oris   Sinus bradycardia    Asymptomatic    Surgical History: Past Surgical History:  Procedure Laterality Date   ABDOMINAL AORTIC ENDOVASCULAR STENT GRAFT N/A 09/26/2022   Procedure: ABDOMINAL AORTIC ENDOVASCULAR STENT GRAFT;  Surgeon: Gretta Lonni PARAS, MD;  Location: [facility] OR;  Service: Vascular;  Laterality: N/A;   AORTOGRAM  09/26/2022   Procedure: AORTOGRAM;  Surgeon: Gretta Lonni PARAS, MD;  [facility] OR;  Service: Vascular;;   BALLOON DILATION  03/14/2023   Procedure: MERRILL HODGKIN;  Surgeon: Cinderella Deatrice FALCON, MD;  [facility];  Service: Endoscopy;;   BIOPSY  03/14/2023   Procedure: BIOPSY;  Surgeon: Cinderella Deatrice FALCON, MD;  [facility];  Service: Endoscopy;;   CARDIAC CATHETERIZATION  09/26/2012   1/4 patent bypass (occluded SVG-PDA, SVG-OM, LIMA-LAD), SVG-diagonal patent and fills the diagonal and LAD, distal RCA occlusion with left to right collateralization, patent circumflex, LAD with no flow-limiting disease and antegrade flow competitively from SVG-diagonal; EF 60-65%   COLONOSCOPY  11/30/2009   MFM:[email or web address]. next TCS 11/2019   COLONOSCOPY WITH PROPOFOL  N/A 12/18/2019   Procedure: COLONOSCOPY WITH PROPOFOL ;  Surgeon: Eartha Angelia Sieving, MD;  [facility];  Service: Gastroenterology;  Laterality: N/A;  1030   COLONOSCOPY WITH PROPOFOL  N/A 03/14/2023   Procedure: COLONOSCOPY WITH PROPOFOL ;  Surgeon: Cinderella Deatrice FALCON, MD;  [facility];  Service: Endoscopy;  Laterality: N/A;  12:45pm;asa 3   CORONARY ARTERY BYPASS GRAFT  2000   CYSTOSCOPY N/A 08/31/2021   Procedure: CYSTOSCOPY;  Surgeon: Sherrilee Belvie CROME, MD;  Location: [facility] ORS;  Service: Urology;  Laterality: N/A;  pt knows to arrive at 7:00   CYSTOSCOPY WITH FULGERATION N/A 03/22/2022  Procedure: CYSTOSCOPY WITH FULGERATION;  Surgeon: Sherrilee Belvie CROME, MD;  Location: [facility] ORS;  Service: Urology;  Laterality: N/A;   CYSTOSCOPY WITH INJECTION N/A 06/13/2023   Procedure: CYSTOSCOPY WITH INJECTION- Botox  100 units;  Surgeon: Sherrilee Belvie CROME, MD;  Location: [facility];  Service: Urology;  Laterality: N/A;  pt will arrive between 8:30 - 9:00   ENDARTERECTOMY FEMORAL Right 09/26/2022   Procedure: RIGHT COMMON FEMORAL ENDARTERECTOMY WITH BOVINE PATCH;  Surgeon: Gretta Lonni PARAS, MD;  Location: [facility] OR;  Service: Vascular;  Laterality: Right;   ESOPHAGOGASTRODUODENOSCOPY N/A  08/12/2013   MFM:[id number] peptic stricture with erosive refluxesophagitis - status post Maloney dilation. Hiatal hernia. Abnormalgastric mucosa. Deformity of the pyloric channel suggestive ofprior peptic ulcer disease. Duodenal bulbar diverticulum Statuspost gastric biopsy. h.pylori   ESOPHAGOGASTRODUODENOSCOPY (EGD) WITH PROPOFOL  N/A 03/14/2023   Procedure: ESOPHAGOGASTRODUODENOSCOPY (EGD) WITH PROPOFOL ;  Surgeon: Cinderella Deatrice FALCON, MD;  [facility];  Service: Endoscopy;  Laterality: N/A;  12:45pm;asa 3   LEFT HEART CATHETERIZATION WITH CORONARY/GRAFT ANGIOGRAM N/A 09/26/2012   Procedure: LEFT HEART CATHETERIZATION WITH EL BILE;  Surgeon: Lonni JONETTA Cash, MD;  Location: [facility] CATH LAB;  Service: Cardiovascular;  Laterality: N/A;   MALONEY DILATION N/A 08/12/2013   Procedure: AGAPITO DILATION;  Surgeon: Lamar CHRISTELLA Hollingshead, MD;  [facility];  Service: Endoscopy;  Laterality: N/A;   POLYPECTOMY  12/18/2019   Procedure: POLYPECTOMY;  Surgeon: Eartha Angelia Sieving, MD;  [facility];  Service: Gastroenterology;;  ascending colon polyp    POLYPECTOMY  03/14/2023   Procedure: POLYPECTOMY INTESTINAL;  Surgeon: Cinderella Deatrice FALCON, MD;  [facility];  Service: Endoscopy;;   PR VEIN BYPASS GRAFT,AORTO-FEM-POP Right 07/19/1999   PR VEIN BYPASS GRAFT,AORTO-FEM-POP Left 05/02/2006   RIGHT HEART CATH AND CORONARY/GRAFT ANGIOGRAPHY N/A 04/11/2020   Procedure: RIGHT HEART CATH AND CORONARY/GRAFT ANGIOGRAPHY;  Surgeon: Rolan Ezra RAMAN, MD;  [facility] INVASIVE CV LAB;  Service: Cardiovascular;  Laterality: N/A;   SAVORY DILATION N/A 08/12/2013   Procedure: SAVORY DILATION;  Surgeon: Lamar CHRISTELLA Hollingshead, MD;  [facility];  Service: Endoscopy;  Laterality: N/A;   TRANSURETHRAL RESECTION OF BLADDER TUMOR N/A 08/31/2021   Procedure: TRANSURETHRAL RESECTION OF BLADDER TUMOR (TURBT);  Surgeon: Sherrilee Belvie CROME, MD;  Location: [facility] OR[facility];   Service: Urology;  Laterality: N/A;   ULTRASOUND GUIDANCE FOR VASCULAR ACCESS Bilateral 09/26/2022   Procedure: ULTRASOUND GUIDANCE FOR VASCULAR ACCESS;  Surgeon: Gretta Lonni PARAS, MD;  Location: [facility] OR;  Service: Vascular;  Laterality: Bilateral;   VASCULAR SURGERY     WRIST SURGERY     cyst removal    Home Medications:  Allergies as of 12/09/2023       Reactions   Niacin Itching, Rash   Burning sensation   Contrast Media [iodinated Contrast Media] Nausea And Vomiting   Per patient vocal cords thick with increased phlegm    Gemtesa  [vibegron ] Other (See Comments)   UTI    Iodine -131 Nausea And Vomiting   Jardiance  [empagliflozin ] Other (See Comments)   UTI   Myrbetriq  [mirabegron ] Other (See Comments)   UTI   Nsaids Other (See Comments)   Taking Coumadin         Medication List        Accurate as of December 09, 2023  2:13 PM. If you have any questions, ask your nurse or doctor.          acetaminophen  500 MG tablet Commonly known as: TYLENOL  Take 500-1,000 mg by mouth every  6 (six) hours as needed (pain.).   albuterol  108 (90 Base) MCG/ACT inhaler Commonly known as: VENTOLIN  HFA Inhale 2 puffs into the lungs every 6 (six) hours as needed for wheezing or shortness of breath.   aspirin  EC 81 MG tablet Take 1 tablet (81 mg total) by mouth daily with breakfast.   atorvastatin  80 MG tablet Commonly known as: LIPITOR Take 1 tablet (80 mg total) by mouth daily.   Bard Cunningham Incontin Clamp Misc 1 Units by Does not apply route daily.   dutasteride  0.5 MG capsule Commonly known as: AVODART  Take 1 capsule (0.5 mg total) by mouth daily.   finasteride  5 MG tablet Commonly known as: PROSCAR  Take 1 tablet (5 mg total) by mouth daily.   HYDROcodone -acetaminophen  10-325 MG tablet Commonly known as: NORCO Take 1 tablet by mouth 2 (two) times daily as needed (pain.).   ipratropium 0.03 % nasal spray Commonly known as: ATROVENT  Place 2 sprays into both  nostrils 2 (two) times daily as needed for rhinitis.   isosorbide  mononitrate 60 MG 24 hr tablet Commonly known as: IMDUR  Take 2 tablets (120 mg total) by mouth every evening.   latanoprost  0.005 % ophthalmic solution Commonly known as: XALATAN  Place 1 drop into both eyes at bedtime.   levocetirizine 5 MG tablet Commonly known as: XYZAL Take 5 mg by mouth every evening.   losartan  100 MG tablet Commonly known as: COZAAR  Take 1 tablet (100 mg total) by mouth daily.   metFORMIN  500 MG tablet Commonly known as: GLUCOPHAGE  Take 500 mg by mouth 2 (two) times daily with a meal.   nitrofurantoin  (macrocrystal-monohydrate) 100 MG capsule Commonly known as: MACROBID  Take 1 capsule (100 mg total) by mouth every 12 (twelve) hours.   nitrofurantoin  50 MG capsule Commonly known as: Macrodantin  Take 1 capsule (50 mg total) by mouth at bedtime.   nitroGLYCERIN  0.4 MG SL tablet Commonly known as: NITROSTAT  Place 1 tablet (0.4 mg total) under the tongue every 5 (five) minutes x 3 doses as needed for chest pain.   OXYGEN  Inhale 3 L into the lungs at bedtime.   pantoprazole  40 MG tablet Commonly known as: PROTONIX  Take 1 tablet (40 mg total) by mouth daily. Take 30-60 min before first meal of the day   polyethylene glycol powder 17 GM/SCOOP powder Commonly known as: GLYCOLAX /MIRALAX  Take 8.5 g by mouth daily.   spironolactone  25 MG tablet Commonly known as: ALDACTONE  TAKE 1 TABLET EVERY DAY   sulfamethoxazole -trimethoprim  800-160 MG tablet Commonly known as: BACTRIM  DS Take 1 tablet by mouth every 12 (twelve) hours.   tamsulosin  0.4 MG Caps capsule Commonly known as: FLOMAX  Take by mouth.   tiZANidine  4 MG capsule Commonly known as: Zanaflex  Take 1 capsule (4 mg total) by mouth 3 (three) times daily as needed for muscle spasms. Do not drink alcohol or drive while taking this medication.  May cause drowsiness.   torsemide  10 MG tablet Commonly known as: DEMADEX  Take 1  tablet (10 mg total) by mouth daily as needed.   triamcinolone cream 0.1 % Commonly known as: KENALOG Apply 1 Application topically 2 (two) times daily as needed (skin irritation.).   Vitamin D3 50 MCG (2000 UT) capsule Take 2,000 Units by mouth daily.   warfarin 2 MG tablet Commonly known as: COUMADIN  Take 2 mg by mouth every evening.        Allergies:  Allergies  Allergen Reactions   Niacin Itching and Rash    Burning sensation   Contrast  Media [Iodinated Contrast Media] Nausea And Vomiting    Per patient vocal cords thick with increased phlegm    Gemtesa  [Vibegron ] Other (See Comments)    UTI    Iodine -131 Nausea And Vomiting   Jardiance  [Empagliflozin ] Other (See Comments)    UTI   Myrbetriq  [Mirabegron ] Other (See Comments)    UTI   Nsaids Other (See Comments)    Taking Coumadin     Family History: Family History  Problem Relation Age of Onset   Deep vein thrombosis Father    Lung cancer Sister    Diabetes Sister    Heart disease Sister        After age 32   Hyperlipidemia Sister    Hypertension Sister    Lung cancer Sister    Breast cancer Sister    Hypertension Mother    Diabetes Sister    Coronary artery disease Other        family hx of   Cancer Brother        crab cancer   Heart disease Brother    Heart attack Brother    Heart attack Daughter    Colon cancer Neg Hx    Stroke Neg Hx     Social History:  reports that he quit smoking about 5 years ago. His smoking use included cigarettes. He started smoking about 75 years ago. He has a 140 pack-year smoking history. He has never used smokeless tobacco. He reports current alcohol use. He reports that he does not use drugs.  ROS: All other review of systems were reviewed and are negative except what is noted above in HPI  Physical Exam: There were no vitals taken for this visit.  Constitutional:  Alert and oriented, No acute distress. HEENT: [place] AT, moist mucus membranes.  Trachea midline, no  masses. Cardiovascular: No clubbing, cyanosis, or edema. Respiratory: Normal respiratory effort, no increased work of breathing. GI: Abdomen is soft, nontender, nondistended, no abdominal masses GU: No CVA tenderness.  Lymph: No cervical or inguinal lymphadenopathy. Skin: No rashes, bruises or suspicious lesions. Neurologic: Grossly intact, no focal deficits, moving all 4 extremities. Psychiatric: Normal mood and affect.  Laboratory Data: Lab Results  Component Value Date   WBC 8.3 12/11/2022   HGB 12.2 (L) 12/11/2022   HCT 39.9 12/11/2022   MCV 89.9 12/11/2022   PLT 241 12/11/2022    Lab Results  Component Value Date   CREATININE 1.24 08/06/2023    No results found for: PSA  No results found for: TESTOSTERONE  Lab Results  Component Value Date   HGBA1C 6.8 (H) 06/10/2023    Urinalysis    Component Value Date/Time   COLORURINE YELLOW 09/30/2022 1810   APPEARANCEUR Clear 06/24/2023 1459   LABSPEC 1.006 09/30/2022 1810   PHURINE 7.0 09/30/2022 1810   GLUCOSEU Negative 06/24/2023 1459   HGBUR SMALL (A) 09/30/2022 1810   BILIRUBINUR Negative 06/24/2023 1459   KETONESUR NEGATIVE 09/30/2022 1810   PROTEINUR 1+ (A) 06/24/2023 1459   PROTEINUR NEGATIVE 09/30/2022 1810   NITRITE Negative 06/24/2023 1459   NITRITE NEGATIVE 09/30/2022 1810   LEUKOCYTESUR 2+ (A) 06/24/2023 1459   LEUKOCYTESUR NEGATIVE 09/30/2022 1810    Lab Results  Component Value Date   LABMICR See below: 06/24/2023   WBCUA >30 (A) 06/24/2023   LABEPIT 0-10 06/24/2023   MUCUS Present (A) 01/16/2023   BACTERIA Few (A) 06/24/2023    Pertinent Imaging:  No results found for this or any previous visit.  No results found for  this or any previous visit.  No results found for this or any previous visit.  No results found for this or any previous visit.  No results found for this or any previous visit.  No results found for this or any previous visit.  Results for orders placed during the  hospital encounter of 08/18/21  CT HEMATURIA WORKUP  Narrative CLINICAL DATA:  Gross hematuria since early May, history of prostate cancer * Tracking Code: BO *  EXAM: CT ABDOMEN AND PELVIS WITHOUT AND WITH CONTRAST  TECHNIQUE: Multidetector CT imaging of the abdomen and pelvis was performed following the standard protocol before and following the bolus administration of intravenous contrast.  RADIATION DOSE REDUCTION: This exam was performed according to the departmental dose-optimization program which includes automated exposure control, adjustment of the mA and/or kV according to patient size and/or use of iterative reconstruction technique.  CONTRAST:  OMNIPAQUE  IOHEXOL  300 MG/ML  SOLN  COMPARISON:  None Available.  FINDINGS: Lower chest: No acute abnormality. Cardiomegaly. Coronary artery calcifications.  Hepatobiliary: No solid liver abnormality is seen. Small, definitively benign cyst or hemangioma of the central liver dome for which no further follow-up or characterization is required (series 15, image 30). No gallstones, gallbladder wall thickening, or biliary dilatation.  Pancreas: Unremarkable. No pancreatic ductal dilatation or surrounding inflammatory changes.  Spleen: Normal in size without significant abnormality.  Adrenals/Urinary Tract: Adrenal glands are unremarkable. Small simple, benign bilateral renal cortical cysts, as well as intrinsically hyperdense, nonenhancing hemorrhagic or proteinaceous cysts of the midportions of the left and right kidneys (series 2, image 37). No urinary tract filling defect on delayed phase imaging. Bladder is unremarkable.  Stomach/Bowel: Stomach is within normal limits. Appendix appears normal. No evidence of bowel wall thickening, distention, or inflammatory changes. Descending and sigmoid diverticulosis.  Vascular/Lymphatic: Aortic atherosclerosis. 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 No enlarged abdominal or pelvic lymph nodes.  Reproductive: Prostate brachytherapy.  Other: Small, fat containing bilateral inguinal hernias. No ascites.  Musculoskeletal: No acute or significant osseous findings.  IMPRESSION: 1. No evidence of urinary tract mass, calculus, or hydronephrosis. No urinary tract filling defect on delayed phase imaging. 2. Prostate brachytherapy. No evidence of mass or lymphadenopathy in the abdomen or pelvis. 3. Small simple, benign bilateral renal cortical cysts, as well as nonenhancing, benign hemorrhagic or proteinaceous cysts. No further follow-up or characterization is required for these benign renal cysts. 4. 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 Recommend referral to a vascular specialist. This recommendation follows ACR consensus guidelines: White Paper of the ACR Incidental Findings Committee II on Vascular Findings. J Am Coll Radiol 2013; 10:789-794. 5. Cardiomegaly and coronary artery disease.  Aortic Atherosclerosis (ICD10-I70.0).   Electronically Signed By: Marolyn JONETTA Jaksch M.D. On: 08/19/2021 11:29  No results found for this or any previous visit.   Assessment & Plan:    1. OAB (overactive bladder) (Primary) -reschedule intravesical botox   2.  Acute cystitis without hematuria Urine for culture Macrobid  100mg  BID for  7 days   No follow-ups on file.  Belvie Clara, MD  [facility]

## 2023-12-10 ENCOUNTER — Encounter: Payer: Self-pay | Admitting: Urology

## 2023-12-10 NOTE — Patient Instructions (Signed)
 Urinary Tract Infection, Male A urinary tract infection (UTI) is an infection in your urinary tract. The urinary tract is made up of the organs that make, store, and get rid of pee (urine) in your body. These organs include: The kidneys. The ureters. The bladder. The urethra. What are the causes? Most UTIs are caused by germs called bacteria. They may be in or near your genitals. These germs grow and cause swelling in your urinary tract. What increases the risk? You're more likely to get a UTI if: You have a soft tube called a catheter that drains your pee. You can't control when you pee or poop. You have trouble peeing because of: A prostate that's bigger than normal. A kidney stone. A urinary blockage. A nerve condition that affects your bladder. Not getting enough to drink. You're sexually active. You're an older adult. You're also more likely to get a UTI if you have other health problems, such as: Diabetes. A weak immune system. Your immune system is your body's defense system. Sickle cell disease. Injury of the spine. What are the signs or symptoms? Symptoms may include: Needing to pee right away. Peeing small amounts often. Trouble getting the stream started. Pain or burning when you pee. Blood in your pee. Pee that smells bad or odd. Pain in your belly or lower back. You may also: Feel confused. This may be the first symptom in older adults. Vomit. Not feel hungry. Feel tired or easily annoyed. Have a fever or chills. How is this diagnosed? A UTI is diagnosed based on your medical history and an exam. You may also have other tests. These may include: Pee tests. Blood tests. Tests for sexually transmitted infections (STIs). If you've had more than one UTI, you may need to have imaging studies done to find out why you keep getting them. How is this treated? A UTI can be treated by: Taking antibiotics or other medicines. Drinking enough fluid to keep your pee  pale yellow. In rare cases, a UTI can cause a very bad condition called sepsis. Sepsis may be treated in the hospital. Follow these instructions at home: Medicines Take your medicines only as told by your health care provider. If you were given antibiotics, take them as told by your provider. Do not stop taking them even if you start to feel better. General instructions Make sure you: Pee often and fully. Do not hold your pee for a long time. Pee after you have sex. Contact a health care provider if: Your symptoms don't get better after 1-2 days of taking antibiotics. Your symptoms go away and then come back. You have a fever or chills. You vomit or feel like you may vomit. Get help right away if: You have very bad pain in your back or lower belly. You faint. This information is not intended to replace advice given to you by your health care provider. Make sure you discuss any questions you have with your health care provider. Document Revised: 02/27/2023 Document Reviewed: 06/22/2022 Elsevier Patient Education  2025 [employer].

## 2023-12-12 LAB — URINE CULTURE

## 2023-12-13 ENCOUNTER — Ambulatory Visit: Payer: Self-pay

## 2023-12-30 ENCOUNTER — Other Ambulatory Visit: Payer: Self-pay

## 2023-12-30 ENCOUNTER — Telehealth: Payer: Self-pay | Admitting: Urology

## 2023-12-30 ENCOUNTER — Ambulatory Visit

## 2023-12-30 DIAGNOSIS — R399 Unspecified symptoms and signs involving the genitourinary system: Secondary | ICD-10-CM

## 2023-12-30 NOTE — Telephone Encounter (Signed)
 Patient called into the office today with general questions/concerns regarding has infection like he had before. Burning with urination and looks messy. I asked if it was UTI and he stated whatever he had a month ago. Wants to see if something can be called in Patient may be reached at [phone] to discuss questions.

## 2023-12-30 NOTE — Telephone Encounter (Signed)
 Dysuria  Patient called with c/o dysuria x 2-3 days days.  Pain: burning  Severity:7/10  Associated Signs and Symptoms:  Fever: noTemp.0 Chills: no Hematuria: no Urgency: yes Frequency: yes Hesitancy:no Incontinence: yes Nausea: no Vomiting: no  Urologic History:  Any Recent Urologic Surgeries or Procedures:no Recurrent UTI's:yes Cystitis: no  Prostatitis:no Kidney or Bladder Stones: no Plan: Walk-in Clinic: no Appointment w/Physician: [no Lab visit scheduled for urine drop off: Yes Advice given:  Do you take on daily medications for UTI suppression Yes Macrodantin  50 mg

## 2023-12-31 LAB — MICROSCOPIC EXAMINATION: RBC, Urine: >30 /HPF — AB (ref 0–2)

## 2023-12-31 LAB — URINALYSIS, ROUTINE W REFLEX MICROSCOPIC
Bilirubin, UA: NEGATIVE
Glucose, UA: NEGATIVE
Ketones, UA: NEGATIVE
Nitrite, UA: NEGATIVE
Specific Gravity, UA: 1.015 (ref 1.005–1.030)
Urobilinogen, Ur: 1 mg/dL (ref 0.2–1.0)
pH, UA: 6 (ref 5.0–7.5)

## 2024-01-01 ENCOUNTER — Telehealth: Payer: Self-pay

## 2024-01-01 ENCOUNTER — Ambulatory Visit: Payer: Self-pay

## 2024-01-01 DIAGNOSIS — R399 Unspecified symptoms and signs involving the genitourinary system: Secondary | ICD-10-CM

## 2024-01-01 LAB — URINE CULTURE

## 2024-01-01 MED ORDER — DOXYCYCLINE HYCLATE 100 MG PO CAPS: 100.0000 mg | ORAL_CAPSULE | Freq: Two times a day (BID) | ORAL | 0 refills | Status: DC

## 2024-01-01 NOTE — Telephone Encounter (Signed)
 Pt is made aware Verbal from MD, McKenzie to send in Doxycycline  100 mg po BID X 7 days for UTI symptoms. Verbalized understanding

## 2024-01-27 ENCOUNTER — Other Ambulatory Visit

## 2024-01-27 ENCOUNTER — Telehealth: Payer: Self-pay

## 2024-01-27 DIAGNOSIS — R300 Dysuria: Secondary | ICD-10-CM

## 2024-01-27 LAB — URINALYSIS, ROUTINE W REFLEX MICROSCOPIC
Bilirubin, UA: NEGATIVE
Glucose, UA: NEGATIVE
Ketones, UA: NEGATIVE
Nitrite, UA: NEGATIVE
Specific Gravity, UA: 1.01 (ref 1.005–1.030)
Urobilinogen, Ur: 0.2 mg/dL (ref 0.2–1.0)
pH, UA: 6.5 (ref 5.0–7.5)

## 2024-01-27 LAB — MICROSCOPIC EXAMINATION
RBC, Urine: >30 /HPF — AB (ref 0–2)
WBC, UA: >30 /HPF — AB (ref 0–5)

## 2024-01-27 MED ORDER — NITROFURANTOIN MONOHYD MACRO 100 MG PO CAPS: 100.0000 mg | ORAL_CAPSULE | Freq: Two times a day (BID) | ORAL | 0 refills | Status: DC

## 2024-01-27 NOTE — Telephone Encounter (Signed)
 Dysuria  Patient called with c/o dysuria x 2-3 days days.  Pain: burning  Severity:5/10  Associated Signs and Symptoms:  Fever: noTemp. Chills: no Hematuria: no Urgency: yes Frequency: yes Hesitancy:no Incontinence: yes Nausea: no Vomiting: no  Urologic History:  Any Recent Urologic Surgeries or Procedures:no Recurrent UTI's:yes Cystitis: no  Prostatitis:no Kidney or Bladder Stones: no Plan: Walk-in Clinic: no Appointment w/Physician: [no Lab visit scheduled for urine drop off: Yes Advice given: patient will by the office today to leave urine sample.   Do you take on daily medications for UTI suppression Yes

## 2024-01-27 NOTE — Telephone Encounter (Signed)
 Left detailed Voiced message making pt aware.   Patient presents today with complaints of  Burning, frequency, and incontinence.  UA and Culture done today.  Dr. Sherrilee reviewed results and Macrobid  100 mg BID X 7 days .  Patient aware of MD recommendations and that we will reach out with culture results.      Dyjwpvlj, CMA

## 2024-01-27 NOTE — Addendum Note (Signed)
 Addended by: GRETTA MASTERS R on: 01/27/2024 01:50 PM   Modules accepted: Orders

## 2024-01-29 LAB — URINE CULTURE

## 2024-01-31 ENCOUNTER — Ambulatory Visit: Payer: Self-pay

## 2024-02-19 ENCOUNTER — Ambulatory Visit (INDEPENDENT_AMBULATORY_CARE_PROVIDER_SITE_OTHER): Admitting: Urology

## 2024-02-19 VITALS — BP 101/48 | HR 75

## 2024-02-19 DIAGNOSIS — N3281 Overactive bladder: Secondary | ICD-10-CM

## 2024-02-19 LAB — URINALYSIS, ROUTINE W REFLEX MICROSCOPIC
Bilirubin, UA: NEGATIVE
Glucose, UA: NEGATIVE
Ketones, UA: NEGATIVE
Leukocytes,UA: NEGATIVE
Nitrite, UA: NEGATIVE
Protein,UA: NEGATIVE
Specific Gravity, UA: <1.005 — ABNORMAL LOW (ref 1.005–1.030)
Urobilinogen, Ur: 0.2 mg/dL (ref 0.2–1.0)
pH, UA: 6 (ref 5.0–7.5)

## 2024-02-19 LAB — MICROSCOPIC EXAMINATION

## 2024-02-19 MED ORDER — ONABOTULINUMTOXINA 100 UNITS IJ SOLR
100.0000 [IU] | Freq: Once | INTRAMUSCULAR | Status: AC
  Administered 2024-02-19: 100 [IU] via INTRAMUSCULAR

## 2024-02-19 MED ORDER — CIPROFLOXACIN HCL 500 MG PO TABS
500.0000 mg | ORAL_TABLET | Freq: Once | ORAL | Status: AC
  Administered 2024-02-19: 500 mg via ORAL

## 2024-02-19 MED ORDER — LIDOCAINE HCL 1 % IJ SOLN
20.0000 mL | Freq: Once | INTRAMUSCULAR | Status: AC
  Administered 2024-02-19: 20 mL

## 2024-02-19 NOTE — Progress Notes (Signed)
 Intraservice. Urojet was instilled using sterile technique into the urethra without difficulty.  Anesthesia: 1% plain lidocaine  50 cc instilled via careful sterile placement of a foley catheter. The bladder was allowed to achieve anesthesia via gently rolling the patient side to side for 15 minutes. The catheter was removed after the instillation. TIME ELEMENT = 15 MINUTES  Prep: BOTOX (R) ([employer], [place], [place], 100 Units per vial) was reconstituted gently with 10 mL of 0.9% sterile, nonpreserved saline solution and drawn into a 10-mL syringe. An additional 1 mL of sterile, nonpreserved saline solution was drawn into a separate syringe for the final injection so that the remaining BOTOX (R) in the needle was delivered to the bladder. TIME ELEMENT = 15 MINUTES  Procedure: A rigid cystoscope was gently inserted into the urinary bladder via the urethra. The trigone was identified and evaluated. Next we identified 20 template sites within the urinary bladder. The bladder is only instilled with 100 cc volume to ensure adequate muscle wall thickness to prevent needle penetration beyond the muscle wall. Careful injections were undertaken for a total of 20 injections using a Laborie depth-limiting needle [employer] [place], [place]). 0.5 cc volume was delivered at each site carefully into the muscle without excessive bleb formation submucosally. Targeted zones were injected over TIME ELEMENT =15 MINUTES.  Postservice: The patient was monitored in recovery for 30 minutes. Patient voided successfully. Clean intermittent catheterization training was reviewed again for the patient. Respiratory and cardiac status were confirmed for stability. Mentation is assessed to be adequate for discharge with alteration in status. TIME ELEMENT = 30 MINUTES. TOTAL PROCEDURE TIME: 1 HR AND 10 MINUTES  Qty: 100 Unit: kit Route: Intra-vesically   Patient was instructed to call the office immediately for bloody  urine, difficulty urinating, urinary retention, painful or frequent urination, fever, chills, nausea, vomiting, or other illness. The patient stated that she understood these instructions and would comply with them. Patient was provided prophylactic antibiotics (except aminoglycosides) for 1 to 3 days postprocedure.

## 2024-02-19 NOTE — Progress Notes (Signed)
 Order received to complete in and out catherization for urine sample. Patient was cleaned and prepped in a sterle fashion with Betadinex3  A 14 fr catheter foley was inserted. Urine return was note 20 ml. Urine Clear yellow in color. Catheter was then removed. Patient tolerated procedure with no complications were noted.  Performed by Exie LANES CMA  Urine sent for routine urinalysis

## 2024-02-23 ENCOUNTER — Encounter: Payer: Self-pay | Admitting: Urology

## 2024-02-23 NOTE — Patient Instructions (Signed)
 Botulinum Toxin Bladder Injection  A botulinum toxin bladder injection is a procedure to treat an overactive bladder. During the procedure, a drug called botulinum toxin is injected into the bladder through a long, thin needle. This drug relaxes the bladder muscles and reduces overactivity. You may need this procedure if your medicines are not working or you cannot take them. The procedure may be repeated as needed. The treatment is done once and it usually lasts for 6 months. Your health care provider will monitor you to see how well you respond. Tell a health care provider about: Any allergies you have. All medicines you are taking, including vitamins, herbs, eye drops, creams, and over-the-counter medicines. Any problems you or family members have had with anesthetic medicines. Any bleeding problems you have. Any surgeries you have had. Any medical conditions you have. Any previous reactions to a botulinum toxin injection. Any symptoms of urinary tract infection. These include chills, fever, a burning feeling when passing urine, and needing to pass urine often. Whether you are pregnant or may be pregnant. What are the risks? Generally this is a safe procedure. However, problems may occur, including: Not being able to pass urine. If this happens, you may need to have your bladder emptied with a thin tube (urinary catheter). Bleeding. Urinary tract infection. Allergic reaction to the botulinum toxin. Pain or burning when passing urine. Damage to nearby structures or organs. What happens before the procedure? When to stop eating and drinking Follow instructions from your health care provider about what you may eat and drink before your procedure. These may include: 8 hours before the procedure Stop eating most foods. Do not eat meat, fried foods, or fatty foods. Eat only light foods, such as toast or crackers. All liquids are okay except energy drinks and alcohol. 6 hours before the  procedure Stop eating. Drink only clear liquids, such as water, clear fruit juice, black coffee, plain tea, and sports drinks. Do not drink energy drinks or alcohol. 2 hours before the procedure Stop drinking all liquids. You may be allowed to take medicines with small sips of water. If you do not follow your health care provider's instructions, your procedure may be delayed or canceled. Medicines Ask your health care provider about: Changing or stopping your regular medicines. This is especially important if you are taking diabetes medicines or blood thinners. Taking medicines such as aspirin and ibuprofen. These medicines can thin your blood. Do not take these medicines unless your health care provider tells you to take them. Taking over-the-counter medicines, vitamins, herbs, and supplements. General instructions Ask your health care provider what steps will be taken to help prevent infection. These steps may include: Removing hair at the procedure site. Washing skin with a germ-killing soap. Taking antibiotic medicine. If you will be going home right after the procedure, plan to have a responsible adult: Take you home from the hospital or clinic. You will not be allowed to drive. Care for you for the time you are told. What happens during the procedure?  You will be asked to empty your bladder. An IV will be inserted into one of your veins. You will be given one or more of the following: A medicine to help you relax (sedative). A medicine to numb the area (local anesthetic). A medicine to make you fall asleep (general anesthetic). A long, thin scope called a cystoscope will be passed into your bladder through the part of the body that carries urine from your bladder (urethra). The cystoscope  will be used to fill your bladder with water. A long needle will be passed through the cystoscope and into the bladder. The botulinum toxin will be injected into your bladder. It may be  injected into multiple areas of your bladder. The cystoscope will be removed and your bladder will be emptied with a urinary catheter. The procedure may vary among health care providers and hospitals. What can I expect after the procedure? After your procedure, it is common to have: Blood-tinged urine. Burning or soreness when you pass urine. Follow these instructions at home: Medicines Take over-the-counter and prescription medicines only as told by your health care provider. If you were prescribed an antibiotic medicine, take it as told by your health care provider. Do not stop using the antibiotic even if you start to feel better. General instructions  If you were given a sedative during the procedure, it can affect you for several hours. Do not drive or operate machinery until your health care provider says that it is safe. Drink enough fluid to keep your urine pale yellow. Return to your normal activities as told by your health care provider. Ask your health care provider what activities are safe for you. Keep all follow-up visits. Contact a health care provider if you have: A fever or chills. Blood-tinged urine for more than one day after your procedure. Worsening pain or burning when you pass urine. Pain or burning when passing urine for more than two days after your procedure. Trouble emptying your bladder. Get help right away if you: Have bright red blood in your urine. Are unable to pass urine. Summary A botulinum toxin bladder injection is a procedure to treat an overactive bladder. This is generally a safe procedure. However, problems may occur, including not being able to pass urine, bleeding, infection, pain, and an allergic reaction to the botulinum toxin. You will be told when to stop eating and drinking, and what medicines to change or stop. Follow instructions carefully. After the procedure, it is common to have blood in your urine and to have soreness or burning when  passing urine. Contact a health care provider if you have a fever, blood in your urine for more than a few days, or trouble passing urine. Get help right away if you have bright red blood in your urine, or if you are unable to pass urine. This information is not intended to replace advice given to you by your health care provider. Make sure you discuss any questions you have with your health care provider. Document Revised: 09/23/2020 Document Reviewed: 09/23/2020 Elsevier Patient Education  2024 [employer].

## 2024-03-04 ENCOUNTER — Telehealth (HOSPITAL_COMMUNITY): Payer: Self-pay

## 2024-03-04 NOTE — Telephone Encounter (Signed)
 Called to confirm/remind patient of their appointment at the [facility] on 03/05/24 3:00.   Appointment:   [x] Confirmed  [] Left mess   [] No answer/No voice mail  [] VM Full/unable to leave message  [] Phone not in service  Patient reminded to bring all medications and/or complete list.  Confirmed patient has transportation. Gave directions, instructed to utilize valet parking.

## 2024-03-05 ENCOUNTER — Encounter (HOSPITAL_COMMUNITY): Payer: Self-pay

## 2024-03-05 ENCOUNTER — Ambulatory Visit (HOSPITAL_COMMUNITY): Payer: Self-pay | Admitting: Internal Medicine

## 2024-03-05 ENCOUNTER — Ambulatory Visit (HOSPITAL_COMMUNITY): Admission: RE | Admit: 2024-03-05 | Discharge: 2024-03-05 | Attending: Internal Medicine

## 2024-03-05 VITALS — BP 178/74 | HR 77 | Ht 70.0 in | Wt 204.8 lb

## 2024-03-05 DIAGNOSIS — I739 Peripheral vascular disease, unspecified: Secondary | ICD-10-CM

## 2024-03-05 DIAGNOSIS — D509 Iron deficiency anemia, unspecified: Secondary | ICD-10-CM

## 2024-03-05 DIAGNOSIS — I10 Essential (primary) hypertension: Secondary | ICD-10-CM

## 2024-03-05 DIAGNOSIS — J9611 Chronic respiratory failure with hypoxia: Secondary | ICD-10-CM

## 2024-03-05 DIAGNOSIS — I272 Pulmonary hypertension, unspecified: Secondary | ICD-10-CM

## 2024-03-05 DIAGNOSIS — I5032 Chronic diastolic (congestive) heart failure: Secondary | ICD-10-CM

## 2024-03-05 DIAGNOSIS — R001 Bradycardia, unspecified: Secondary | ICD-10-CM

## 2024-03-05 DIAGNOSIS — I714 Abdominal aortic aneurysm, without rupture, unspecified: Secondary | ICD-10-CM

## 2024-03-05 DIAGNOSIS — I2510 Atherosclerotic heart disease of native coronary artery without angina pectoris: Secondary | ICD-10-CM

## 2024-03-05 DIAGNOSIS — I4821 Permanent atrial fibrillation: Secondary | ICD-10-CM

## 2024-03-05 DIAGNOSIS — I6529 Occlusion and stenosis of unspecified carotid artery: Secondary | ICD-10-CM

## 2024-03-05 LAB — BASIC METABOLIC PANEL WITH GFR
Anion gap: 7 (ref 5–15)
BUN: 19 mg/dL (ref 8–23)
CO2: 29 mmol/L (ref 22–32)
Calcium: 9.1 mg/dL (ref 8.9–10.3)
Chloride: 101 mmol/L (ref 98–111)
Creatinine, Ser: 1.17 mg/dL (ref 0.61–1.24)
GFR, Estimated: >60 mL/min (ref >60)
Glucose, Bld: 103 mg/dL — ABNORMAL HIGH (ref 70–99)
Potassium: 4.5 mmol/L (ref 3.5–5.1)
Sodium: 137 mmol/L (ref 135–145)

## 2024-03-05 LAB — BRAIN NATRIURETIC PEPTIDE: B Natriuretic Peptide: 128.3 pg/mL — ABNORMAL HIGH (ref 0.0–100.0)

## 2024-03-05 MED ORDER — AMLODIPINE BESYLATE 2.5 MG PO TABS: 2.5000 mg | ORAL_TABLET | Freq: Every day | ORAL | 5 refills | Status: AC

## 2024-03-05 NOTE — Progress Notes (Signed)
 [facility] CLIN[facility] NOTE  Primary Care: [name] Cardiology: Dr. Rolan  HPI: Troy [name] is a 83 y.o. male patient with history of permanent atrial fibrillation, CAD s/p CABG 2000, PAD, chronic diastolic CHF was referred to CHF clinic by [name], PA, for evaluation of CHF and pulmonary hypertension.  Patient had CABG in 2000, last cath was in 2014 showing occluded SVG-OM, occluded SVG-PDA, occluded RCA, and atretic LIMA.  SVG-D was patent and native LAD was patent.  Patient had bilateral fem-pop bypasses, peripheral dopplers in 7/21 showed that the right fem-pop was occluded.  He is in permanent atrial fibrillation on warfarin. Echo in 10/21 showed normal LV systolic function with moderately dilated/mildly dysfunctional RV and PASP 75 mmHg.  In 12/21, he was admitted with PNA and treated with antibiotics.  CTA chest showed no PE, RML PNA, and bilateral hilar + mediastinal lymphadenopathy.  He was sent home from this admission on home oxygen .   In 1/22, RHC/LHC was done.  This showed patent LIMA-LAD and SVG-D, occluded SVG-OM and occluded SVG-RCA, 95% calcified proximal RCA stenosis, totally occluded PLV with collaterals (known from prior).  There was moderate pulmonary hypertension with optimized left and right heart filling pressures.  I reviewed the cath with interventional cardiology, would require atherectomy to treat the RCA.  We decided to manage medically initially.   He stopped Jardiance  due to penile yeast infection.   Echo 4/23 showed EF 55-60%, mild LVH, RV mildly reduced.   Admitted 10/23 with hematuria and inability to urinate. Urology consulted, foley placed and discharged home. Seen in the ED a week later with hematuria. Urology consulted and arranged traction on foley to help assist with tamponade.   Echo 3/24 showed EF 60-65% with mildly D-shaped septum, mild RV dilation with mildly decreased systolic function, IVC dilated, PASP 45 mmHg.   Seen in ED 07/10/22  with CP. HR 30 when EMS arrived, asymptomatic. He was given IV atropine and 200 cc IVF bolus. HsTroponins x 3 negative, HR 50's. Live Zio 2 week placed 4/24.  Zio (5/24) showed continuous atrial fibrillation, average HR 60 bpm. Rare PVCs.  6/24 underwent EVAR for AAA with repair of right common femoral artery. Discharged to SNF.  Discharged to home from [facility] 10/10/22.   Today he returns for AHF follow up. Overall feeling pretty good. Denies palpitations, CP, dizziness, edema, or PND/Orthopnea. Not much SOB. Appetite too good, does not watch sodium intake. No fever or chills. Weight at home 198-203 pounds. Taking all medications. Denies ETOH, tobacco or drug use. Walks around his house. Has been concerned about his back recently. Takes Torsemide  rarely. Drinks >64 oz/day. SBP at home 132/54 this morning.   ECG (personally reviewed): none ordered today.  PMH: 1. Atrial fibrillation: Permanent.  2. CAD: CABG 2000.  - LHC (2014) with totally occluded RCA, totally occluded SVG-RCA, totally occluded SVG-OM, atretic LIMA, patent SVG-D.   - LHC (1/22): patent LIMA-LAD and SVG-D, occluded SVG-OM and occluded SVG-RCA, 60-70% calcified ostial RCA stenosis and 95% calcified proximal RCA stenosis, totally occluded PLV with collaterals (known from prior). 3. HTN 4. PAD: s/p bilateral fem-pop bypasses.   - Peripheral arterial dopplers (7/21): Occluded right fem-pop, patent left fem-pop.  - ABIs (3/25): Normal 5. Type 2 diabetes 6. Prostate cancer: Treated with radiation. 7. PVCs 8. History of renal artery stenosis.  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  10. Chronic diastolic CHF: Echo (10/21) with EF 60-65%, mildly decreased RV  systolic function with moderate RV enlargement, severe biatrial enlargement, PASP 75 mmHg, dilated IVC.  V/Q scan 7/22 with no chronic PE.  - RHC (1/22): mean RA 6, PA 65/18 mean 35, mean PCWP 14, CI 2.71, PVR 3.7 WU.  - Echo (4/23): EF 55-60%, mild LVH, RV  mildly reduced, severely elevated pulmonary artery systolic pressure - Echo (3/24): EF 60-65%, D-shaped septum, mild RV enlargement and mildly decreased RV systolic function, PASP 45 11. 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  - Abdominal US  (8/22): 4.8 cm AAA - CT abd/pelvis (5/23): 4.8 cm AAA - CT abd/pelvis (2/24): 5 cm AAA - EVAR for AAA in 6/24 12. Carotid stenosis: Carotid dopplers (8/22) with 40-59% LICA stenosis.  - Carotid dopplers (9/23): 40-59% LICA stenosis.  - Carotid dopplers (3/25): 40-59% LICA stenosis 13. 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  Retired.  Remote smoker (>20 years ago). No ETOH.   Family History  Problem Relation Age of Onset   Deep vein thrombosis Father    Lung cancer Sister    Diabetes Sister    Heart disease Sister        After age 68   Hyperlipidemia Sister    Hypertension Sister    Lung cancer Sister    Breast cancer Sister    Hypertension Mother    Diabetes Sister    Coronary artery disease Other        family hx of   Cancer Brother        crab cancer   Heart disease Brother    Heart attack Brother    Heart attack Daughter    Colon cancer Neg Hx    Stroke Neg Hx    ROS: All systems reviewed and negative except as per HPI.   Current Outpatient Medications  Medication Sig Dispense Refill   acetaminophen  (TYLENOL ) 500 MG tablet Take 500-1,000 mg by mouth every 6 (six) hours as needed (pain.).     albuterol  (VENTOLIN  HFA) 108 (90 Base) MCG/ACT inhaler Inhale 2 puffs into the lungs every 6 (six) hours as needed for wheezing or shortness of breath. 1 each 2   aspirin  81 MG EC tablet Take 1 tablet (81 mg total) by mouth daily with breakfast. 30 tablet 3   atorvastatin  (LIPITOR) 80 MG tablet Take 1 tablet (80 mg total) by mouth daily.     Cholecalciferol (VITAMIN D3) 50 MCG (2000 UT) capsule Take 2,000 Units by mouth daily.     dutasteride  (AVODART ) 0.5 MG capsule Take 1 capsule (0.5 mg total) by mouth daily. 30  capsule 11   finasteride  (PROSCAR ) 5 MG tablet Take 1 tablet (5 mg total) by mouth daily. 90 tablet 3   HYDROcodone -acetaminophen  (NORCO) 10-325 MG tablet Take 1 tablet by mouth 2 (two) times daily as needed (pain.).     Incontinence Supplies (BARD [place] INCONTIN CLAMP) MISC 1 Units by Does not apply route daily. 1 each 0   ipratropium (ATROVENT ) 0.03 % nasal spray Place 2 sprays into both nostrils 2 (two) times daily as needed for rhinitis.     isosorbide  mononitrate (IMDUR ) 60 MG 24 hr tablet Take 2 tablets (120 mg total) by mouth every evening. 180 tablet 3   latanoprost  (XALATAN ) 0.005 % ophthalmic solution Place 1 drop into both eyes at bedtime.     levocetirizine (XYZAL) 5 MG tablet Take 5 mg by mouth every evening.     losartan  (COZAAR ) 100 MG tablet Take 1 tablet (100  mg total) by mouth daily. 90 tablet 3   metFORMIN  (GLUCOPHAGE ) 500 MG tablet Take 500 mg by mouth 2 (two) times daily with a meal.     nitroGLYCERIN  (NITROSTAT ) 0.4 MG SL tablet Place 1 tablet (0.4 mg total) under the tongue every 5 (five) minutes x 3 doses as needed for chest pain. 25 tablet 3   OXYGEN  Inhale 3 L into the lungs at bedtime.     pantoprazole  (PROTONIX ) 40 MG tablet Take 1 tablet (40 mg total) by mouth daily. Take 30-60 min before first meal of the day     polyethylene glycol powder (GLYCOLAX /MIRALAX ) 17 GM/SCOOP powder Take 8.5 g by mouth daily.     spironolactone  (ALDACTONE ) 25 MG tablet TAKE 1 TABLET EVERY DAY 90 tablet 3   tamsulosin  (FLOMAX ) 0.4 MG CAPS capsule Take by mouth.     tiZANidine  (ZANAFLEX ) 4 MG capsule Take 1 capsule (4 mg total) by mouth 3 (three) times daily as needed for muscle spasms. Do not drink alcohol or drive while taking this medication.  May cause drowsiness. 15 capsule 0   torsemide  (DEMADEX ) 10 MG tablet Take 1 tablet (10 mg total) by mouth daily as needed. 30 tablet 3   triamcinolone cream (KENALOG) 0.1 % Apply 1 Application topically 2 (two) times daily as needed (skin  irritation.).     warfarin (COUMADIN ) 2 MG tablet Take 2 mg by mouth every evening.     No current facility-administered medications for this encounter.   Wt Readings from Last 3 Encounters:  03/05/24 92.9 kg (204 lb 12.8 oz)  12/03/23 93 kg (205 lb 0.4 oz)  09/25/23 93 kg (205 lb)   BP (!) 178/74   Pulse 77   Ht 5' 10 (1.778 m)   Wt 92.9 kg (204 lb 12.8 oz)   SpO2 97%   BMI 29.39 kg/m   Physical Exam: General:  elderly appearing.  No respiratory difficulty. In electric wheelchair.  Neck: JVD ~6/7 cm.  Cor: Regular rate & irregular rhythm. No murmurs. Lungs: clear Extremities: trace BLE edema  Neuro: alert & oriented x 3. Affect pleasant.   Assessment/Plan: 1. Chronic diastolic CHF: Echo in 10/21 with EF 60-65%, mildly decreased RV systolic function with moderate RV enlargement, severe biatrial enlargement, PASP 75 mmHg, dilated IVC. After increasing diuretics, RHC in 1/22 showed normal filling pressures with moderate pulmonary hypertension. Echo 4/23 with EF 60-65%, mildly enlarged RV with mildly decreased RV function. Echo 3/24 showed EF 60-65% with mildly D-shaped septum, mild RV dilation with mildly decreased systolic function, IVC dilated, PASP 45 mmHg.  - NYHA class II, but not very active due to back pain and deconditioning.  He is not volume overloaded on exam.  - Continue torsemide  10 mg daily PRN. Has a great weight log and takes his Torsemide  when his weight goes up.  - Continue spironolactone  25 mg daily. - Continue losartan  100 mg daily.  - No SGLT2 inhibitor with incontinence, UTIs, yeast infections.  Followed by urology.  - Update echo at f/u.  2. 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  Moderate PH on RHC in 1/22.  Suspect group 3 PH in setting of scarring from recurrent aspiration PNA.  Less likely group 1 PH.  No chronic PEs on 7/22 V/Q scan. 3. Chronic hypoxemic respiratory failure: Remote smoker.  Hilar and mediastinal lymphadenopathy on chest CT, no reported  ILD.  Recurrent aspiration PNA in setting of chronic right vocal cord paralysis. He is now only using oxygen  at night.    -  Should follow with pulmonary.  4. CAD: s/p CABG in 2000.  Cath in 1/22 showed patent LIMA-LAD and SVG-D but occluded SVG-OM and SVG-RCA.  There was 60-70% ostial RCA stenosis and 95% proximal RCA stenosis with heavy calcification.  Discussed with interventional, with normal EF plan was to proceed with initial medical management and proceed to PCI if chest pain worsened (PCI would require atherectomy). No exertional chest pain. - Continue ASA 81 and atorvastatin  80 mg daily, good lipids 6/25 - Continue Imdur  120 mg daily for angina control.  - Off Toprol  XL with bradycardia. 5. Atrial fibrillation: Permanent. Rate control is reasonable without nodal blocking agent.  - On warfarin. PCP manages INR. No bleeding issues. 6. PAD: Occluded right fem-pop bypass. No claudication.  ABIs in 3/25 were normal.  - Follows with [facility]. 7. Carotid stenosis: 40-59% LICA stenosis on 3/25 dopplers, followed at [facility].  8. AAA: 5 cm AAA on 2/24 abdominal CT. EVAR 6/24.  9. Incontinence: Follows closely with urology. On tamsulosin .   10. Bradycardia: Given IV atropine with HR 30s in 4/24; he was asymptomatic.  Now off beta blocker.  Zio (5/24) showed continuous atrial fibrillation, average HR 60 bpm. Rare PVCs.   11. 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  12. HTN: BP elevated today.  - Continue losartan  100 mg daily.  - Start Amlodipine  2.5 mg daily  Follow up in 4 months with Dr. Rolan + echo.   Beckey LITTIE Coe AGACNP-BC  03/05/2024

## 2024-03-05 NOTE — Patient Instructions (Signed)
 Medication Changes:  START AMLODIPINE  2.5MG  ONCE DAILY   Lab Work:  Labs done today, your results will be available in MyChart, we will contact you for abnormal readings.  Follow-Up in: 4 MONTHS WITH ECHO PLEASE CALL OUR OFFICE AROUND FEBRUARY TO GET SCHEDULED FOR YOUR APPOINTMENT. PHONE NUMBER IS [phone] OPTION 2   At the [facility], you and your health needs are our priority. We have a designated team specialized in the treatment of Heart Failure. This Care Team includes your primary Heart Failure Specialized Cardiologist (physician), Advanced Practice Providers (APPs- Physician Assistants and Nurse Practitioners), and Pharmacist who all work together to provide you with the care you need, when you need it.   You may see any of the following providers on your designated Care Team at your next follow up:  Dr. Toribio Fuel Dr. Ezra Shuck Dr. Odis Brownie Greig Mosses, NP Caffie Shed, [place] [facility] [place], [place] Beckey Coe, NP Jordan Lee, NP Tinnie Redman, PharmD   Please be sure to bring in all your medications bottles to every appointment.   Need to Contact Us :  If you have any questions or concerns before your next appointment please send us  a message through [facility] or call our office at [phone].    TO LEAVE A MESSAGE FOR THE NURSE SELECT OPTION 2, PLEASE LEAVE A MESSAGE INCLUDING: YOUR NAME DATE OF BIRTH CALL BACK NUMBER REASON FOR CALL**this is important as we prioritize the call backs  YOU WILL RECEIVE A CALL BACK THE SAME DAY AS LONG AS YOU CALL BEFORE 4:00 PM

## 2024-03-06 ENCOUNTER — Ambulatory Visit (HOSPITAL_COMMUNITY)

## 2024-03-09 ENCOUNTER — Ambulatory Visit: Admitting: Internal Medicine

## 2024-03-09 DIAGNOSIS — G4734 Idiopathic sleep related nonobstructive alveolar hypoventilation: Secondary | ICD-10-CM

## 2024-03-09 DIAGNOSIS — R0609 Other forms of dyspnea: Secondary | ICD-10-CM

## 2024-03-10 ENCOUNTER — Other Ambulatory Visit: Payer: Self-pay | Admitting: Urology

## 2024-03-10 DIAGNOSIS — N453 Epididymo-orchitis: Secondary | ICD-10-CM

## 2024-04-23 ENCOUNTER — Encounter: Payer: Self-pay | Admitting: Internal Medicine

## 2024-04-23 ENCOUNTER — Ambulatory Visit (INDEPENDENT_AMBULATORY_CARE_PROVIDER_SITE_OTHER): Admitting: Internal Medicine

## 2024-04-23 VITALS — BP 142/56 | HR 59 | Ht 70.0 in | Wt 205.0 lb

## 2024-04-23 DIAGNOSIS — R490 Dysphonia: Secondary | ICD-10-CM | POA: Diagnosis not present

## 2024-04-23 DIAGNOSIS — Z87891 Personal history of nicotine dependence: Secondary | ICD-10-CM

## 2024-04-23 DIAGNOSIS — R0902 Hypoxemia: Secondary | ICD-10-CM | POA: Diagnosis not present

## 2024-04-23 DIAGNOSIS — R0609 Other forms of dyspnea: Secondary | ICD-10-CM

## 2024-04-23 DIAGNOSIS — G4734 Idiopathic sleep related nonobstructive alveolar hypoventilation: Secondary | ICD-10-CM

## 2024-04-23 NOTE — Assessment & Plan Note (Addendum)
 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 - RHC  04/11/20 RA mean 6 RV 64/6 PA 65/18, mean 35 PCWP mean 14 Ao 112/45 Oxygen  saturations: PA 62% AO 100% Cardiac Output (Fick) 5.71  Cardiac Index (Fick) 2.71 PVR 3.7 WU - PFT's  05/05/20  FEV1 2.87 (94 % ) ratio 0.76  p 0 % improvement from saba p o prior to study with DLCO  13.20 (53%) corrects to 2.46 (62 %)  for alv volume and FV curve   -  06/07/2020   Walked RA  approx   300 ft  @ slow to moderate pace  stopped due  to hip pain/no SOB with sats 93% at end    - ONO RA 06/16/20  desats x 4 h on RA > rec 2lpm and repeat (see noct hypoxemia)  - 02/02/2021 pt walked at a slow pace on room air. Reported SOB on the second lap. Stopped walk after second lap due to legs tired with lowest sats 96%  - 02/07/2022   Walked on RA  x  one  lap(s) =  approx 150  ft  @ slow/walker  pace, stopped due to back pain with lowest 02 sats 95%  - 03/07/2023   Walked on RA  x  2  lap(s) =  approx 300  ft  @ slow/ walkerpace, stopped due to tired with lowest 02 sats 96% but increased to 97% before stopping    Improved to his satisfaction  >>> f/u yearly

## 2024-04-23 NOTE — Patient Instructions (Signed)
 No change in medications   My office will be contacting you by phone for referral to [facility] regarding your throat/ hoarseness   - if you don't hear back from my office within one week please call us  back or notify us  thru MyChart and we'll address it right away.    Please schedule a follow up visit in 12  months but call sooner if needed

## 2024-04-23 NOTE — Assessment & Plan Note (Addendum)
"   ONO RA 06/16/20  desats x 4 h on RA > rec 2lpm and repeat - 07/20/2020 not using 02 > rec take it and recheck ONO on 2lpm to assure adequate noct sats > did not do - sleep study done  11/13/20 mild osa with desats > rec repeat ono on 2lpm > done 07/21/21 and on 2lpm and still totaled 1h  27 min < 89% so rec repeat on 3lpm 07/26/2021 >>> 08/03/21  Still desat 59 min but record says 2lpm - regardless rec increase by 1lpm and repeat > done 08/27/21 and sats ok on 3lpm > continue 3lpm  - 02/07/2022 no desats walking slow pace due to back pain  In view of PH WHO 3 rec >>>   continue 3 lpm hs   >>> cautioned re use of macrodantin  chronically and contact me immediately if 02 needs go up as may be an early indication of macrodantin  pulmonary toxicity   "

## 2024-04-23 NOTE — Progress Notes (Addendum)
 "   Troy [name], male    DOB: Nov 06, 1940,     MRN: [id number]   Brief patient profile:  44  yowm with known IHD quit smoking 04/1998 with onset around 2010 dysphagia and recurrent aspiration / pneumonia eval by Karis with dx of paralyzed R VC last eval 04/12/20 doing ok per [facility] clinic referred to pulmonary clinic in [place]  06/07/2020 by Dr   Vergil for recurrent asp and mild/mod Pulmonary hypertension  History of Present Illness  06/07/2020  Pulmonary/ 1st office eval/ Avram Danielson / [place] [facility]  Chief Complaint  Patient presents with   Pulmonary Consult    Referred by Dr Ezra Shuck.  Pt c/o trouble with his breathing since Dec 2021. He states has hx of recurrent PNA and aspiration. He gets winded walking a long distance. He also c/o cough with white sputum. He has albuterol  inhaler and neb and has not been using these.   Dyspnea:  Has not done much since last admit   using walker at home and able to do [employer] shopping leaning on cart  Cough: feels something hanging in throat, worse with swallowing food/ worse in am's assoc with sense of pnds  Keeps a can by bed > white mucus  Sleep: flat bed / one pillow on side  SABA use: not really feeling like he needs it now but has duoneb and hfa  02 stopped 3 weeks prior to OV  And no worse off it can I turn it back in  rec Try albuterol  (the puffer one day, the nebulizer the next)  15 min before an activity that you know would make you short of breath  Change the way you take your acid pills:  Pantoprazole  (protonix ) 40 mg   Take  30-60 min before first meal of the day and Pepcid  (famotidine )  20 mg one after the last meal until return to office - this is the best way to tell whether stomach acid is contributing to your problem.   GERD diet  Discuss any sinus problems with your  ENT (ENT is a sinus doctor ) Please schedule a follow up office visit in 6 weeks, call sooner if needed      03/07/2023  f/u ov/[place] office/Murlean Seelye re: VCD/Asp  maint on 3lpm hs   Chief Complaint  Patient presents with   Surgical Clearance   Dyspnea: using  2 wheeled walker, limited from walking  due to back and legs,  not breathing Cough: minimal dry assoc with sneezing / hoarseness  Sleeping: flat level bed with one pillows s   resp cc  SABA use: rarely  02:  3lpm hs only  Rec Use albuterol  2 pffs right before going in surgery  If not completely better with voice and your breathing see your ENT doctor Cxr wnl     04/23/2024 Yearly  f/u ov/ office/Mace Weinberg re: VCD with R immobility /asp maint on macrodantin    Chief Complaint  Patient presents with   Shortness of Breath    F/u  Coughing   Dyspnea:  2 wheeled walker/ back and legs stop him before the breathing  Cough: as usual, feels something hanging in throat but  Sleeping: flat bed one pillow s    resp cc  SABA use: once a month or two  02: 3lpm hs prn    No obvious day to day or daytime variability or assoc excess/ purulent sputum or mucus plugs or hemoptysis or cp or chest tightness, subjective wheeze  or overt sinus or hb symptoms.    Also denies any obvious fluctuation of symptoms with weather or environmental changes or other aggravating or alleviating factors except as outlined above   No unusual exposure hx or h/o childhood pna/ asthma or knowledge of premature birth.  Current Allergies, Complete Past Medical History, Past Surgical History, Family History, and Social History were reviewed in [employer] record.  ROS  The following are not active complaints unless bolded Hoarseness, sore throat, dysphagia(globus) , dental problems, itching, sneezing,  nasal congestion or discharge of excess mucus or purulent secretions, ear ache,   fever, chills, sweats, unintended wt loss or wt gain, classically pleuritic or exertional cp,  orthopnea pnd or arm/hand swelling  or leg swelling, presyncope, palpitations, abdominal pain, anorexia, nausea, vomiting,  diarrhea  or change in bowel habits or change in bladder habits, change in stools or change in urine, dysuria, hematuria,  rash, arthralgias, visual complaints, headache, numbness, weakness or ataxia or problems with walking or coordination,  change in mood or  memory.         Outpatient Medications Prior to Visit  Medication Sig Dispense Refill   acetaminophen  (TYLENOL ) 500 MG tablet Take 500-1,000 mg by mouth every 6 (six) hours as needed (pain.).     albuterol  (VENTOLIN  HFA) 108 (90 Base) MCG/ACT inhaler Inhale 2 puffs into the lungs every 6 (six) hours as needed for wheezing or shortness of breath. 1 each 2   amLODipine  (NORVASC ) 2.5 MG tablet Take 1 tablet (2.5 mg total) by mouth daily. 30 tablet 5   aspirin  81 MG EC tablet Take 1 tablet (81 mg total) by mouth daily with breakfast. 30 tablet 3   atorvastatin  (LIPITOR) 80 MG tablet Take 1 tablet (80 mg total) by mouth daily.     Cholecalciferol (VITAMIN D3) 50 MCG (2000 UT) capsule Take 2,000 Units by mouth daily.     dutasteride  (AVODART ) 0.5 MG capsule Take 1 capsule (0.5 mg total) by mouth daily. 30 capsule 11   finasteride  (PROSCAR ) 5 MG tablet Take 1 tablet (5 mg total) by mouth daily. 90 tablet 3   HYDROcodone -acetaminophen  (NORCO) 10-325 MG tablet Take 1 tablet by mouth 2 (two) times daily as needed (pain.).     Incontinence Supplies (BARD [place] INCONTIN CLAMP) MISC 1 Units by Does not apply route daily. 1 each 0   ipratropium (ATROVENT ) 0.03 % nasal spray Place 2 sprays into both nostrils 2 (two) times daily as needed for rhinitis.     isosorbide  mononitrate (IMDUR ) 60 MG 24 hr tablet Take 2 tablets (120 mg total) by mouth every evening. 180 tablet 3   latanoprost  (XALATAN ) 0.005 % ophthalmic solution Place 1 drop into both eyes at bedtime.     levocetirizine (XYZAL) 5 MG tablet Take 5 mg by mouth every evening.     losartan  (COZAAR ) 100 MG tablet Take 1 tablet (100 mg total) by mouth daily. 90 tablet 3   metFORMIN  (GLUCOPHAGE )  500 MG tablet Take 500 mg by mouth 2 (two) times daily with a meal.     nitrofurantoin  (MACRODANTIN ) 50 MG capsule TAKE 1 CAPSULE AT BEDTIME 90 capsule 3   nitroGLYCERIN  (NITROSTAT ) 0.4 MG SL tablet Place 1 tablet (0.4 mg total) under the tongue every 5 (five) minutes x 3 doses as needed for chest pain. 25 tablet 3   OXYGEN  Inhale 3 L into the lungs at bedtime.     pantoprazole  (PROTONIX ) 40 MG tablet Take 1 tablet (40 mg  total) by mouth daily. Take 30-60 min before first meal of the day     polyethylene glycol powder (GLYCOLAX /MIRALAX ) 17 GM/SCOOP powder Take 8.5 g by mouth daily.     spironolactone  (ALDACTONE ) 25 MG tablet TAKE 1 TABLET EVERY DAY 90 tablet 3   tamsulosin  (FLOMAX ) 0.4 MG CAPS capsule Take by mouth.     tiZANidine  (ZANAFLEX ) 4 MG capsule Take 1 capsule (4 mg total) by mouth 3 (three) times daily as needed for muscle spasms. Do not drink alcohol or drive while taking this medication.  May cause drowsiness. 15 capsule 0   torsemide  (DEMADEX ) 10 MG tablet Take 1 tablet (10 mg total) by mouth daily as needed. 30 tablet 3   triamcinolone cream (KENALOG) 0.1 % Apply 1 Application topically 2 (two) times daily as needed (skin irritation.).     warfarin (COUMADIN ) 2 MG tablet Take 2 mg by mouth every evening.     No facility-administered medications prior to visit.                        Past Medical History:  Diagnosis Date   AAA (abdominal aortic aneurysm) (HCC)    Followed by Dr. Krystal Early   Arthritis    CAD (coronary artery disease)    a. CABG 2000.   Cancer [facility])    Prostate:  Radiation Tx   Carotid artery disease (HCC)    Chest pain    precordial. mild chronic .SABRA... nonischemic   CHF (congestive heart failure) (HCC)    Chronic edema    Coronary artery disease    a. Nuclear, January, 2008, no ischemia b. Cath 08/2012- 1/4 patent grafts, RCA CTO, no flow-limiting disease, medically managed   Diabetes mellitus without complication (HCC)    Dizziness 02/2011    Dyslipidemia    GERD (gastroesophageal reflux disease)    TAKES TUMS & ROLAIDS AS NEEDED   Hx of CABG 2000   Hypertension    Myocardial infarction (HCC) 1999   Neck pain 02/2011   Pneumonia    Pulmonary hypertension (HCC)    Renal artery stenosis (HCC)    50-70%   S/P femoropopliteal bypass surgery    Dr. Oris   Sinus bradycardia    Asymptomatic      Objective:    Wts  04/23/2024       205  03/07/2023       202  02/07/2022       202   02/02/2021       207   07/20/20 205 lb 3.2 oz (93.1 kg)  06/07/20 205 lb 3.2 oz (93.1 kg)  05/25/20 205 lb (93 kg)    Vital signs reviewed  04/23/2024  - Note at rest 02 sats  96% on RA   General appearance:    amb hoarse wm / mod pseudowheeze better with PLM   HEENT : Oropharynx  full dentures/ M1 airway          NECK :  without  apparent JVD/ palpable Nodes/TM    LUNGS: no acc muscle use,  Nl contour chest which is clear to A and P bilaterally without cough on insp or exp maneuvers   CV:  RRR  no s3 or murmur or increase in P2, and no edema   ABD:  soft and nontender   MS:  Gait walks with 2 wheeled walker  ext warm without deformities Or obvious joint restrictions  calf tenderness, cyanosis or clubbing  SKIN: warm and dry without lesions    NEURO:  alert, approp, nl sensorium with  no motor or cerebellar deficits apparent.       Assessment   Assessment & Plan DOE (dyspnea on exertion) 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 - [facility]  04/11/20 RA mean 6 RV 64/6 PA 65/18, mean 35 PCWP mean 14 Ao 112/45 Oxygen  saturations: PA 62% AO 100% Cardiac Output (Fick) 5.71  Cardiac Index (Fick) 2.71 PVR 3.7 WU - PFT's  05/05/20  FEV1 2.87 (94 % ) ratio 0.76  p 0 % improvement from saba p o prior to study with DLCO  13.20 (53%) corrects to 2.46 (62 %)  for alv volume and FV curve   -  06/07/2020   Walked RA  approx   300 ft  @ slow to moderate pace  stopped due  to hip pain/no SOB with sats 93% at  end    - ONO RA 06/16/20  desats x 4 h on RA > rec 2lpm and repeat (see noct hypoxemia)  - 02/02/2021 pt walked at a slow pace on room air. Reported SOB on the second lap. Stopped walk after second lap due to legs tired with lowest sats 96%  - 02/07/2022   Walked on RA  x  one  lap(s) =  approx 150  ft  @ slow/walker  pace, stopped due to back pain with lowest 02 sats 95%  - 03/07/2023   Walked on RA  x  2  lap(s) =  approx 300  ft  @ slow/ walkerpace, stopped due to tired with lowest 02 sats 96% but increased to 97% before stopping    Improved to his satisfaction  >>> f/u yearly   Nocturnal hypoxemia  ONO RA 06/16/20  desats x 4 h on RA > rec 2lpm and repeat - 07/20/2020 not using 02 > rec take it and recheck ONO on 2lpm to assure adequate noct sats > did not do - sleep study done  11/13/20 mild osa with desats > rec repeat ono on 2lpm > done 07/21/21 and on 2lpm and still totaled 1h  27 min < 89% so rec repeat on 3lpm 07/26/2021 >>> 08/03/21  Still desat 59 min but record says 2lpm - regardless rec increase by 1lpm and repeat > done 08/27/21 and sats ok on 3lpm > continue 3lpm  - 02/07/2022 no desats walking slow pace due to back pain  In view of PH WHO 3 rec >>>   continue 3 lpm hs   >>> cautioned re use of macrodantin  chronically and contact me immediately if 02 needs go up as may be an early indication of macrodantin  pulmonary toxicity   Hoarseness Prev [facility] but has not seen since 12/2019 with R VC immobility  - referred to [facility] ent pt's request 04/23/2024 >>>  At risk for VCD with airway compromise and aspiration but dought there's much more we can do at this point.    >>> ENT eval  >>> f/u with [facility] p that eval complete and GI inptu as needed      Each maintenance medication was reviewed in detail including emphasizing most importantly the difference between maintenance and prns and under what circumstances the prns are to be triggered using an action plan format where  appropriate.  Total time for H and P, chart review, counseling, reviewing 02  device(s) and generating customized AVS unique to this office  visit / same day charting = 33 min          AVS  Patient Instructions  No change in medications   My office will be contacting you by phone for referral to [facility] regarding your throat/ hoarseness   - if you don't hear back from my office within one week please call us  back or notify us  thru MyChart and we'll address it right away.    Please schedule a follow up visit in 12  months but call sooner if needed    Ozell America, MD 04/23/2024             "

## 2024-04-23 NOTE — Assessment & Plan Note (Addendum)
 Prev followed [facility] but has not seen since 12/2019 with R VC immobility  - referred to [facility] ent pt's request 04/23/2024 >>>  At risk for VCD with airway compromise and aspiration but dought there's much more we can do at this point.    >>> ENT eval  >>> f/u with [facility] p that eval complete and GI inptu as needed      Each maintenance medication was reviewed in detail including emphasizing most importantly the difference between maintenance and prns and under what circumstances the prns are to be triggered using an action plan format where appropriate.  Total time for H and P, chart review, counseling, reviewing 02  device(s) and generating customized AVS unique to this office visit / same day charting = 33 min

## 2024-05-27 ENCOUNTER — Institutional Professional Consult (permissible substitution) (INDEPENDENT_AMBULATORY_CARE_PROVIDER_SITE_OTHER)

## 2024-08-19 ENCOUNTER — Other Ambulatory Visit: Admitting: Urology
